# Patient Record
Sex: Female | Born: 1940
Health system: Southern US, Community
[De-identification: ages and names within clinical notes are randomized; demographics above are authoritative.]

## PROBLEM LIST (undated history)

## (undated) DIAGNOSIS — M545 Low back pain, unspecified: Secondary | ICD-10-CM

## (undated) DIAGNOSIS — R7303 Prediabetes: Secondary | ICD-10-CM

## (undated) DIAGNOSIS — M5136 Other intervertebral disc degeneration, lumbar region: Secondary | ICD-10-CM

## (undated) DIAGNOSIS — K5790 Diverticulosis of intestine, part unspecified, without perforation or abscess without bleeding: Secondary | ICD-10-CM

## (undated) DIAGNOSIS — I10 Essential (primary) hypertension: Secondary | ICD-10-CM

## (undated) DIAGNOSIS — Z9981 Dependence on supplemental oxygen: Secondary | ICD-10-CM

## (undated) DIAGNOSIS — T4145XA Adverse effect of unspecified anesthetic, initial encounter: Secondary | ICD-10-CM

## (undated) DIAGNOSIS — G8929 Other chronic pain: Secondary | ICD-10-CM

## (undated) DIAGNOSIS — E7800 Pure hypercholesterolemia, unspecified: Secondary | ICD-10-CM

## (undated) DIAGNOSIS — E119 Type 2 diabetes mellitus without complications: Secondary | ICD-10-CM

## (undated) DIAGNOSIS — I480 Paroxysmal atrial fibrillation: Secondary | ICD-10-CM

## (undated) DIAGNOSIS — K219 Gastro-esophageal reflux disease without esophagitis: Secondary | ICD-10-CM

## (undated) DIAGNOSIS — T8859XA Other complications of anesthesia, initial encounter: Secondary | ICD-10-CM

## (undated) DIAGNOSIS — R413 Other amnesia: Secondary | ICD-10-CM

## (undated) DIAGNOSIS — J449 Chronic obstructive pulmonary disease, unspecified: Secondary | ICD-10-CM

## (undated) DIAGNOSIS — J45909 Unspecified asthma, uncomplicated: Secondary | ICD-10-CM

## (undated) DIAGNOSIS — I639 Cerebral infarction, unspecified: Secondary | ICD-10-CM

## (undated) DIAGNOSIS — IMO0001 Reserved for inherently not codable concepts without codable children: Secondary | ICD-10-CM

## (undated) DIAGNOSIS — M51369 Other intervertebral disc degeneration, lumbar region without mention of lumbar back pain or lower extremity pain: Secondary | ICD-10-CM

## (undated) DIAGNOSIS — E039 Hypothyroidism, unspecified: Secondary | ICD-10-CM

## (undated) DIAGNOSIS — I739 Peripheral vascular disease, unspecified: Secondary | ICD-10-CM

## (undated) DIAGNOSIS — Z8489 Family history of other specified conditions: Secondary | ICD-10-CM

## (undated) DIAGNOSIS — F329 Major depressive disorder, single episode, unspecified: Secondary | ICD-10-CM

## (undated) DIAGNOSIS — F419 Anxiety disorder, unspecified: Secondary | ICD-10-CM

## (undated) DIAGNOSIS — F32A Depression, unspecified: Secondary | ICD-10-CM

## (undated) DIAGNOSIS — M1990 Unspecified osteoarthritis, unspecified site: Secondary | ICD-10-CM

## (undated) HISTORY — DX: Cerebral infarction, unspecified: I63.9

## (undated) HISTORY — PX: CARDIAC CATHETERIZATION: SHX172

## (undated) HISTORY — DX: Type 2 diabetes mellitus without complications: E11.9

## (undated) HISTORY — PX: KNEE ARTHROSCOPY: SHX127

## (undated) HISTORY — PX: TONSILLECTOMY: SUR1361

## (undated) HISTORY — PX: EYE SURGERY: SHX253

## (undated) HISTORY — PX: EXCISIONAL HEMORRHOIDECTOMY: SHX1541

---

## 1971-08-21 HISTORY — PX: TUBAL LIGATION: SHX77

## 1972-08-20 HISTORY — PX: VAGINAL HYSTERECTOMY: SUR661

## 1999-08-21 HISTORY — PX: SHOULDER OPEN ROTATOR CUFF REPAIR: SHX2407

## 2004-11-21 ENCOUNTER — Emergency Department: Payer: Self-pay | Admitting: Emergency Medicine

## 2005-04-02 ENCOUNTER — Ambulatory Visit: Payer: Self-pay | Admitting: Internal Medicine

## 2005-11-05 ENCOUNTER — Ambulatory Visit: Payer: Self-pay | Admitting: Internal Medicine

## 2006-02-26 ENCOUNTER — Ambulatory Visit: Payer: Self-pay | Admitting: Internal Medicine

## 2006-05-12 ENCOUNTER — Emergency Department: Payer: Self-pay | Admitting: Emergency Medicine

## 2006-05-22 ENCOUNTER — Ambulatory Visit: Payer: Self-pay

## 2006-06-28 ENCOUNTER — Ambulatory Visit: Payer: Self-pay | Admitting: General Practice

## 2006-08-20 HISTORY — PX: TOTAL KNEE ARTHROPLASTY: SHX125

## 2006-11-20 ENCOUNTER — Inpatient Hospital Stay: Payer: Self-pay | Admitting: Internal Medicine

## 2006-11-20 ENCOUNTER — Other Ambulatory Visit: Payer: Self-pay

## 2006-11-28 ENCOUNTER — Ambulatory Visit: Payer: Self-pay | Admitting: Internal Medicine

## 2007-06-09 ENCOUNTER — Ambulatory Visit: Payer: Self-pay | Admitting: General Practice

## 2007-06-25 ENCOUNTER — Inpatient Hospital Stay: Payer: Self-pay | Admitting: General Practice

## 2007-07-01 ENCOUNTER — Encounter: Payer: Self-pay | Admitting: Internal Medicine

## 2007-08-21 HISTORY — PX: JOINT REPLACEMENT: SHX530

## 2007-09-09 ENCOUNTER — Ambulatory Visit: Payer: Self-pay | Admitting: Ophthalmology

## 2008-09-30 ENCOUNTER — Ambulatory Visit: Payer: Self-pay | Admitting: Family Medicine

## 2008-10-06 ENCOUNTER — Ambulatory Visit: Payer: Self-pay | Admitting: Internal Medicine

## 2008-12-31 ENCOUNTER — Emergency Department: Payer: Self-pay | Admitting: Emergency Medicine

## 2009-07-19 ENCOUNTER — Ambulatory Visit: Payer: Self-pay | Admitting: Family Medicine

## 2009-08-13 ENCOUNTER — Emergency Department: Payer: Self-pay | Admitting: Unknown Physician Specialty

## 2009-12-16 ENCOUNTER — Ambulatory Visit: Payer: Self-pay | Admitting: Internal Medicine

## 2010-02-07 ENCOUNTER — Ambulatory Visit: Payer: Self-pay | Admitting: Gastroenterology

## 2010-04-04 ENCOUNTER — Ambulatory Visit: Payer: Self-pay | Admitting: Ophthalmology

## 2010-04-11 ENCOUNTER — Ambulatory Visit: Payer: Self-pay | Admitting: Ophthalmology

## 2010-05-16 ENCOUNTER — Ambulatory Visit: Payer: Self-pay | Admitting: Gastroenterology

## 2010-05-19 LAB — PATHOLOGY REPORT

## 2010-06-01 ENCOUNTER — Ambulatory Visit: Payer: Self-pay

## 2010-06-14 ENCOUNTER — Ambulatory Visit (HOSPITAL_COMMUNITY)
Admission: RE | Admit: 2010-06-14 | Discharge: 2010-06-14 | Payer: Self-pay | Source: Home / Self Care | Admitting: Gastroenterology

## 2010-08-13 ENCOUNTER — Emergency Department: Payer: Self-pay | Admitting: Emergency Medicine

## 2011-10-24 ENCOUNTER — Emergency Department: Payer: Self-pay

## 2011-10-24 DIAGNOSIS — R109 Unspecified abdominal pain: Secondary | ICD-10-CM | POA: Diagnosis not present

## 2011-10-24 DIAGNOSIS — Z79899 Other long term (current) drug therapy: Secondary | ICD-10-CM | POA: Diagnosis not present

## 2011-10-24 DIAGNOSIS — I10 Essential (primary) hypertension: Secondary | ICD-10-CM | POA: Diagnosis not present

## 2011-10-24 DIAGNOSIS — R112 Nausea with vomiting, unspecified: Secondary | ICD-10-CM | POA: Diagnosis not present

## 2011-10-24 LAB — CBC
HCT: 40.4 % (ref 35.0–47.0)
HGB: 13.7 g/dL (ref 12.0–16.0)
MCH: 29.7 pg (ref 26.0–34.0)
MCHC: 33.9 g/dL (ref 32.0–36.0)
MCV: 88 fL (ref 80–100)
Platelet: 324 10*3/uL (ref 150–440)
RBC: 4.61 10*6/uL (ref 3.80–5.20)
RDW: 13.4 % (ref 11.5–14.5)
WBC: 10.8 10*3/uL (ref 3.6–11.0)

## 2011-10-24 LAB — URINALYSIS, COMPLETE
Bacteria: NONE SEEN
Bilirubin,UR: NEGATIVE
Glucose,UR: NEGATIVE mg/dL (ref 0–75)
Nitrite: NEGATIVE
Ph: 7 (ref 4.5–8.0)
Protein: 100
RBC,UR: 3 /HPF (ref 0–5)
Specific Gravity: 1.016 (ref 1.003–1.030)
Squamous Epithelial: 4
WBC UR: 6 /HPF (ref 0–5)

## 2011-10-24 LAB — BASIC METABOLIC PANEL
Anion Gap: 11 (ref 7–16)
BUN: 12 mg/dL (ref 7–18)
Calcium, Total: 9.8 mg/dL (ref 8.5–10.1)
Chloride: 101 mmol/L (ref 98–107)
Co2: 28 mmol/L (ref 21–32)
Creatinine: 0.85 mg/dL (ref 0.60–1.30)
EGFR (African American): >60
EGFR (Non-African Amer.): >60
Glucose: 110 mg/dL — ABNORMAL HIGH (ref 65–99)
Osmolality: 280 (ref 275–301)
Potassium: 3.3 mmol/L — ABNORMAL LOW (ref 3.5–5.1)
Sodium: 140 mmol/L (ref 136–145)

## 2011-10-26 LAB — URINE CULTURE

## 2011-10-29 ENCOUNTER — Ambulatory Visit: Payer: Self-pay

## 2011-10-29 DIAGNOSIS — M169 Osteoarthritis of hip, unspecified: Secondary | ICD-10-CM | POA: Diagnosis not present

## 2011-10-29 DIAGNOSIS — IMO0002 Reserved for concepts with insufficient information to code with codable children: Secondary | ICD-10-CM | POA: Diagnosis not present

## 2011-10-29 DIAGNOSIS — M1610 Unilateral primary osteoarthritis, unspecified hip: Secondary | ICD-10-CM | POA: Diagnosis not present

## 2011-10-29 DIAGNOSIS — M79609 Pain in unspecified limb: Secondary | ICD-10-CM | POA: Diagnosis not present

## 2011-10-29 DIAGNOSIS — M25559 Pain in unspecified hip: Secondary | ICD-10-CM | POA: Diagnosis not present

## 2011-10-29 DIAGNOSIS — M5137 Other intervertebral disc degeneration, lumbosacral region: Secondary | ICD-10-CM | POA: Diagnosis not present

## 2011-10-30 DIAGNOSIS — R300 Dysuria: Secondary | ICD-10-CM | POA: Diagnosis not present

## 2011-11-05 DIAGNOSIS — M545 Low back pain, unspecified: Secondary | ICD-10-CM | POA: Diagnosis not present

## 2011-11-05 DIAGNOSIS — B029 Zoster without complications: Secondary | ICD-10-CM | POA: Diagnosis not present

## 2011-11-05 DIAGNOSIS — B0229 Other postherpetic nervous system involvement: Secondary | ICD-10-CM | POA: Diagnosis not present

## 2011-12-13 DIAGNOSIS — D485 Neoplasm of uncertain behavior of skin: Secondary | ICD-10-CM | POA: Diagnosis not present

## 2011-12-13 DIAGNOSIS — I10 Essential (primary) hypertension: Secondary | ICD-10-CM | POA: Diagnosis not present

## 2011-12-13 DIAGNOSIS — E039 Hypothyroidism, unspecified: Secondary | ICD-10-CM | POA: Diagnosis not present

## 2011-12-13 DIAGNOSIS — F329 Major depressive disorder, single episode, unspecified: Secondary | ICD-10-CM | POA: Diagnosis not present

## 2011-12-13 DIAGNOSIS — E785 Hyperlipidemia, unspecified: Secondary | ICD-10-CM | POA: Diagnosis not present

## 2011-12-13 DIAGNOSIS — Z0000 Encounter for general adult medical examination without abnormal findings: Secondary | ICD-10-CM | POA: Diagnosis not present

## 2011-12-13 DIAGNOSIS — F3289 Other specified depressive episodes: Secondary | ICD-10-CM | POA: Diagnosis not present

## 2011-12-13 DIAGNOSIS — R300 Dysuria: Secondary | ICD-10-CM | POA: Diagnosis not present

## 2011-12-13 DIAGNOSIS — I059 Rheumatic mitral valve disease, unspecified: Secondary | ICD-10-CM | POA: Diagnosis not present

## 2011-12-13 DIAGNOSIS — J449 Chronic obstructive pulmonary disease, unspecified: Secondary | ICD-10-CM | POA: Diagnosis not present

## 2012-01-06 DIAGNOSIS — J449 Chronic obstructive pulmonary disease, unspecified: Secondary | ICD-10-CM | POA: Diagnosis not present

## 2012-01-06 DIAGNOSIS — J209 Acute bronchitis, unspecified: Secondary | ICD-10-CM | POA: Diagnosis not present

## 2012-03-03 DIAGNOSIS — R0989 Other specified symptoms and signs involving the circulatory and respiratory systems: Secondary | ICD-10-CM | POA: Diagnosis not present

## 2012-03-03 DIAGNOSIS — J441 Chronic obstructive pulmonary disease with (acute) exacerbation: Secondary | ICD-10-CM | POA: Diagnosis not present

## 2012-03-03 DIAGNOSIS — R062 Wheezing: Secondary | ICD-10-CM | POA: Diagnosis not present

## 2012-03-03 DIAGNOSIS — R0609 Other forms of dyspnea: Secondary | ICD-10-CM | POA: Diagnosis not present

## 2012-03-10 ENCOUNTER — Ambulatory Visit: Payer: Self-pay | Admitting: Internal Medicine

## 2012-03-10 DIAGNOSIS — J441 Chronic obstructive pulmonary disease with (acute) exacerbation: Secondary | ICD-10-CM | POA: Diagnosis not present

## 2012-03-10 DIAGNOSIS — J4489 Other specified chronic obstructive pulmonary disease: Secondary | ICD-10-CM | POA: Diagnosis not present

## 2012-03-10 DIAGNOSIS — J209 Acute bronchitis, unspecified: Secondary | ICD-10-CM | POA: Diagnosis not present

## 2012-03-10 DIAGNOSIS — R0602 Shortness of breath: Secondary | ICD-10-CM | POA: Diagnosis not present

## 2012-03-10 DIAGNOSIS — R062 Wheezing: Secondary | ICD-10-CM | POA: Diagnosis not present

## 2012-03-10 DIAGNOSIS — I517 Cardiomegaly: Secondary | ICD-10-CM | POA: Diagnosis not present

## 2012-03-10 DIAGNOSIS — J449 Chronic obstructive pulmonary disease, unspecified: Secondary | ICD-10-CM | POA: Diagnosis not present

## 2012-03-10 DIAGNOSIS — R042 Hemoptysis: Secondary | ICD-10-CM | POA: Diagnosis not present

## 2012-03-19 DIAGNOSIS — F17200 Nicotine dependence, unspecified, uncomplicated: Secondary | ICD-10-CM | POA: Diagnosis not present

## 2012-03-19 DIAGNOSIS — I10 Essential (primary) hypertension: Secondary | ICD-10-CM | POA: Diagnosis not present

## 2012-03-19 DIAGNOSIS — J449 Chronic obstructive pulmonary disease, unspecified: Secondary | ICD-10-CM | POA: Diagnosis not present

## 2012-03-19 DIAGNOSIS — R0602 Shortness of breath: Secondary | ICD-10-CM | POA: Diagnosis not present

## 2012-04-02 DIAGNOSIS — R0602 Shortness of breath: Secondary | ICD-10-CM | POA: Diagnosis not present

## 2012-04-24 DIAGNOSIS — R0602 Shortness of breath: Secondary | ICD-10-CM | POA: Diagnosis not present

## 2012-04-24 DIAGNOSIS — R062 Wheezing: Secondary | ICD-10-CM | POA: Diagnosis not present

## 2012-04-24 DIAGNOSIS — R042 Hemoptysis: Secondary | ICD-10-CM | POA: Diagnosis not present

## 2012-05-05 ENCOUNTER — Ambulatory Visit: Payer: Self-pay | Admitting: Internal Medicine

## 2012-05-05 DIAGNOSIS — R042 Hemoptysis: Secondary | ICD-10-CM | POA: Diagnosis not present

## 2012-05-27 DIAGNOSIS — R042 Hemoptysis: Secondary | ICD-10-CM | POA: Diagnosis not present

## 2012-05-27 DIAGNOSIS — J449 Chronic obstructive pulmonary disease, unspecified: Secondary | ICD-10-CM | POA: Diagnosis not present

## 2012-05-27 DIAGNOSIS — F17200 Nicotine dependence, unspecified, uncomplicated: Secondary | ICD-10-CM | POA: Diagnosis not present

## 2012-08-15 DIAGNOSIS — E039 Hypothyroidism, unspecified: Secondary | ICD-10-CM | POA: Diagnosis not present

## 2012-08-15 DIAGNOSIS — Z23 Encounter for immunization: Secondary | ICD-10-CM | POA: Diagnosis not present

## 2012-08-15 DIAGNOSIS — J449 Chronic obstructive pulmonary disease, unspecified: Secondary | ICD-10-CM | POA: Diagnosis not present

## 2012-08-15 DIAGNOSIS — F17200 Nicotine dependence, unspecified, uncomplicated: Secondary | ICD-10-CM | POA: Diagnosis not present

## 2012-08-15 DIAGNOSIS — I10 Essential (primary) hypertension: Secondary | ICD-10-CM | POA: Diagnosis not present

## 2012-08-28 DIAGNOSIS — E782 Mixed hyperlipidemia: Secondary | ICD-10-CM | POA: Diagnosis not present

## 2012-08-28 DIAGNOSIS — Z0000 Encounter for general adult medical examination without abnormal findings: Secondary | ICD-10-CM | POA: Diagnosis not present

## 2012-08-28 DIAGNOSIS — E119 Type 2 diabetes mellitus without complications: Secondary | ICD-10-CM | POA: Diagnosis not present

## 2012-08-28 DIAGNOSIS — E559 Vitamin D deficiency, unspecified: Secondary | ICD-10-CM | POA: Diagnosis not present

## 2012-11-12 DIAGNOSIS — E559 Vitamin D deficiency, unspecified: Secondary | ICD-10-CM | POA: Diagnosis not present

## 2012-11-12 DIAGNOSIS — E039 Hypothyroidism, unspecified: Secondary | ICD-10-CM | POA: Diagnosis not present

## 2012-11-12 DIAGNOSIS — R000 Tachycardia, unspecified: Secondary | ICD-10-CM | POA: Diagnosis not present

## 2012-11-12 DIAGNOSIS — J449 Chronic obstructive pulmonary disease, unspecified: Secondary | ICD-10-CM | POA: Diagnosis not present

## 2012-11-12 DIAGNOSIS — R7301 Impaired fasting glucose: Secondary | ICD-10-CM | POA: Diagnosis not present

## 2012-11-12 DIAGNOSIS — M810 Age-related osteoporosis without current pathological fracture: Secondary | ICD-10-CM | POA: Diagnosis not present

## 2012-11-12 DIAGNOSIS — R011 Cardiac murmur, unspecified: Secondary | ICD-10-CM | POA: Diagnosis not present

## 2013-01-05 DIAGNOSIS — E2839 Other primary ovarian failure: Secondary | ICD-10-CM | POA: Diagnosis not present

## 2013-01-09 DIAGNOSIS — R000 Tachycardia, unspecified: Secondary | ICD-10-CM | POA: Diagnosis not present

## 2013-01-09 DIAGNOSIS — R011 Cardiac murmur, unspecified: Secondary | ICD-10-CM | POA: Diagnosis not present

## 2013-03-16 DIAGNOSIS — I10 Essential (primary) hypertension: Secondary | ICD-10-CM | POA: Diagnosis not present

## 2013-03-16 DIAGNOSIS — E039 Hypothyroidism, unspecified: Secondary | ICD-10-CM | POA: Diagnosis not present

## 2013-03-16 DIAGNOSIS — I289 Disease of pulmonary vessels, unspecified: Secondary | ICD-10-CM | POA: Diagnosis not present

## 2013-03-16 DIAGNOSIS — F17200 Nicotine dependence, unspecified, uncomplicated: Secondary | ICD-10-CM | POA: Diagnosis not present

## 2013-03-16 DIAGNOSIS — R0602 Shortness of breath: Secondary | ICD-10-CM | POA: Diagnosis not present

## 2013-03-16 DIAGNOSIS — E782 Mixed hyperlipidemia: Secondary | ICD-10-CM | POA: Diagnosis not present

## 2013-03-16 DIAGNOSIS — M810 Age-related osteoporosis without current pathological fracture: Secondary | ICD-10-CM | POA: Diagnosis not present

## 2013-03-16 DIAGNOSIS — J449 Chronic obstructive pulmonary disease, unspecified: Secondary | ICD-10-CM | POA: Diagnosis not present

## 2013-03-21 DIAGNOSIS — R5383 Other fatigue: Secondary | ICD-10-CM | POA: Diagnosis not present

## 2013-03-21 DIAGNOSIS — J449 Chronic obstructive pulmonary disease, unspecified: Secondary | ICD-10-CM | POA: Diagnosis not present

## 2013-03-21 DIAGNOSIS — R0602 Shortness of breath: Secondary | ICD-10-CM | POA: Diagnosis not present

## 2013-03-21 DIAGNOSIS — R5381 Other malaise: Secondary | ICD-10-CM | POA: Diagnosis not present

## 2013-03-30 DIAGNOSIS — J449 Chronic obstructive pulmonary disease, unspecified: Secondary | ICD-10-CM | POA: Diagnosis not present

## 2013-03-30 DIAGNOSIS — R000 Tachycardia, unspecified: Secondary | ICD-10-CM | POA: Diagnosis not present

## 2013-03-30 DIAGNOSIS — R0602 Shortness of breath: Secondary | ICD-10-CM | POA: Diagnosis not present

## 2013-05-28 DIAGNOSIS — E559 Vitamin D deficiency, unspecified: Secondary | ICD-10-CM | POA: Diagnosis not present

## 2013-05-28 DIAGNOSIS — E782 Mixed hyperlipidemia: Secondary | ICD-10-CM | POA: Diagnosis not present

## 2013-05-28 DIAGNOSIS — R7301 Impaired fasting glucose: Secondary | ICD-10-CM | POA: Diagnosis not present

## 2013-05-28 DIAGNOSIS — I10 Essential (primary) hypertension: Secondary | ICD-10-CM | POA: Diagnosis not present

## 2013-05-28 DIAGNOSIS — R002 Palpitations: Secondary | ICD-10-CM | POA: Diagnosis not present

## 2013-05-28 DIAGNOSIS — E039 Hypothyroidism, unspecified: Secondary | ICD-10-CM | POA: Diagnosis not present

## 2013-06-17 DIAGNOSIS — I10 Essential (primary) hypertension: Secondary | ICD-10-CM | POA: Diagnosis not present

## 2013-06-17 DIAGNOSIS — F17200 Nicotine dependence, unspecified, uncomplicated: Secondary | ICD-10-CM | POA: Diagnosis not present

## 2013-06-17 DIAGNOSIS — M810 Age-related osteoporosis without current pathological fracture: Secondary | ICD-10-CM | POA: Diagnosis not present

## 2013-06-17 DIAGNOSIS — Z23 Encounter for immunization: Secondary | ICD-10-CM | POA: Diagnosis not present

## 2013-06-17 DIAGNOSIS — I080 Rheumatic disorders of both mitral and aortic valves: Secondary | ICD-10-CM | POA: Diagnosis not present

## 2013-06-17 DIAGNOSIS — I289 Disease of pulmonary vessels, unspecified: Secondary | ICD-10-CM | POA: Diagnosis not present

## 2013-06-17 DIAGNOSIS — R7301 Impaired fasting glucose: Secondary | ICD-10-CM | POA: Diagnosis not present

## 2013-06-17 DIAGNOSIS — R000 Tachycardia, unspecified: Secondary | ICD-10-CM | POA: Diagnosis not present

## 2013-08-05 DIAGNOSIS — L570 Actinic keratosis: Secondary | ICD-10-CM | POA: Diagnosis not present

## 2013-08-05 DIAGNOSIS — L821 Other seborrheic keratosis: Secondary | ICD-10-CM | POA: Diagnosis not present

## 2013-08-19 ENCOUNTER — Ambulatory Visit: Payer: Self-pay | Admitting: Physician Assistant

## 2013-08-19 DIAGNOSIS — I517 Cardiomegaly: Secondary | ICD-10-CM | POA: Diagnosis not present

## 2013-08-19 DIAGNOSIS — R05 Cough: Secondary | ICD-10-CM | POA: Diagnosis not present

## 2013-08-19 DIAGNOSIS — R059 Cough, unspecified: Secondary | ICD-10-CM | POA: Diagnosis not present

## 2013-08-19 DIAGNOSIS — J440 Chronic obstructive pulmonary disease with acute lower respiratory infection: Secondary | ICD-10-CM | POA: Diagnosis not present

## 2013-08-19 DIAGNOSIS — R0602 Shortness of breath: Secondary | ICD-10-CM | POA: Diagnosis not present

## 2013-08-23 ENCOUNTER — Emergency Department: Payer: Self-pay | Admitting: Emergency Medicine

## 2013-08-23 DIAGNOSIS — J449 Chronic obstructive pulmonary disease, unspecified: Secondary | ICD-10-CM | POA: Diagnosis not present

## 2013-08-23 DIAGNOSIS — R0602 Shortness of breath: Secondary | ICD-10-CM | POA: Diagnosis not present

## 2013-08-23 DIAGNOSIS — F17200 Nicotine dependence, unspecified, uncomplicated: Secondary | ICD-10-CM | POA: Diagnosis not present

## 2013-08-23 DIAGNOSIS — J189 Pneumonia, unspecified organism: Secondary | ICD-10-CM | POA: Diagnosis not present

## 2013-08-23 DIAGNOSIS — Z9079 Acquired absence of other genital organ(s): Secondary | ICD-10-CM | POA: Diagnosis not present

## 2013-08-23 DIAGNOSIS — J42 Unspecified chronic bronchitis: Secondary | ICD-10-CM | POA: Diagnosis not present

## 2013-08-23 DIAGNOSIS — I10 Essential (primary) hypertension: Secondary | ICD-10-CM | POA: Diagnosis not present

## 2013-08-23 DIAGNOSIS — R197 Diarrhea, unspecified: Secondary | ICD-10-CM | POA: Diagnosis not present

## 2013-08-23 DIAGNOSIS — R1110 Vomiting, unspecified: Secondary | ICD-10-CM | POA: Diagnosis not present

## 2013-08-23 DIAGNOSIS — R112 Nausea with vomiting, unspecified: Secondary | ICD-10-CM | POA: Diagnosis not present

## 2013-08-23 LAB — CBC
HCT: 44.6 % (ref 35.0–47.0)
HGB: 14.8 g/dL (ref 12.0–16.0)
MCH: 28.9 pg (ref 26.0–34.0)
MCHC: 33.2 g/dL (ref 32.0–36.0)
MCV: 87 fL (ref 80–100)
Platelet: 300 10*3/uL (ref 150–440)
RBC: 5.13 10*6/uL (ref 3.80–5.20)
RDW: 13.2 % (ref 11.5–14.5)
WBC: 15 10*3/uL — ABNORMAL HIGH (ref 3.6–11.0)

## 2013-08-23 LAB — URINALYSIS, COMPLETE
Bilirubin,UR: NEGATIVE
Blood: NEGATIVE
Glucose,UR: NEGATIVE mg/dL (ref 0–75)
Hyaline Cast: 6
Ketone: NEGATIVE
Nitrite: NEGATIVE
Ph: 5 (ref 4.5–8.0)
Protein: 25
RBC,UR: 4 /HPF (ref 0–5)
Specific Gravity: 1.015 (ref 1.003–1.030)
Squamous Epithelial: 6
WBC UR: 2 /HPF (ref 0–5)

## 2013-08-23 LAB — COMPREHENSIVE METABOLIC PANEL
Albumin: 4.1 g/dL (ref 3.4–5.0)
Alkaline Phosphatase: 98 U/L
Anion Gap: 4 — ABNORMAL LOW (ref 7–16)
BUN: 24 mg/dL — ABNORMAL HIGH (ref 7–18)
Bilirubin,Total: 0.5 mg/dL (ref 0.2–1.0)
Calcium, Total: 9 mg/dL (ref 8.5–10.1)
Chloride: 100 mmol/L (ref 98–107)
Co2: 31 mmol/L (ref 21–32)
Creatinine: 0.9 mg/dL (ref 0.60–1.30)
EGFR (African American): >60
EGFR (Non-African Amer.): >60
Glucose: 100 mg/dL — ABNORMAL HIGH (ref 65–99)
Osmolality: 274 (ref 275–301)
Potassium: 3.5 mmol/L (ref 3.5–5.1)
SGOT(AST): 23 U/L (ref 15–37)
SGPT (ALT): 22 U/L (ref 12–78)
Sodium: 135 mmol/L — ABNORMAL LOW (ref 136–145)
Total Protein: 7.9 g/dL (ref 6.4–8.2)

## 2013-08-23 LAB — LIPASE, BLOOD: Lipase: 86 U/L (ref 73–393)

## 2013-08-23 LAB — TROPONIN I: Troponin-I: <0.02 ng/mL

## 2013-08-23 LAB — CLOSTRIDIUM DIFFICILE(ARMC)

## 2013-09-16 DIAGNOSIS — F17200 Nicotine dependence, unspecified, uncomplicated: Secondary | ICD-10-CM | POA: Diagnosis not present

## 2013-09-16 DIAGNOSIS — I080 Rheumatic disorders of both mitral and aortic valves: Secondary | ICD-10-CM | POA: Diagnosis not present

## 2013-09-16 DIAGNOSIS — E782 Mixed hyperlipidemia: Secondary | ICD-10-CM | POA: Diagnosis not present

## 2013-09-16 DIAGNOSIS — E559 Vitamin D deficiency, unspecified: Secondary | ICD-10-CM | POA: Diagnosis not present

## 2013-09-16 DIAGNOSIS — E039 Hypothyroidism, unspecified: Secondary | ICD-10-CM | POA: Diagnosis not present

## 2013-09-16 DIAGNOSIS — J449 Chronic obstructive pulmonary disease, unspecified: Secondary | ICD-10-CM | POA: Diagnosis not present

## 2013-09-16 DIAGNOSIS — M810 Age-related osteoporosis without current pathological fracture: Secondary | ICD-10-CM | POA: Diagnosis not present

## 2014-01-13 DIAGNOSIS — K219 Gastro-esophageal reflux disease without esophagitis: Secondary | ICD-10-CM | POA: Diagnosis not present

## 2014-01-13 DIAGNOSIS — E039 Hypothyroidism, unspecified: Secondary | ICD-10-CM | POA: Diagnosis not present

## 2014-01-13 DIAGNOSIS — I10 Essential (primary) hypertension: Secondary | ICD-10-CM | POA: Diagnosis not present

## 2014-01-13 DIAGNOSIS — J449 Chronic obstructive pulmonary disease, unspecified: Secondary | ICD-10-CM | POA: Diagnosis not present

## 2014-01-13 DIAGNOSIS — R062 Wheezing: Secondary | ICD-10-CM | POA: Diagnosis not present

## 2014-06-19 ENCOUNTER — Inpatient Hospital Stay: Payer: Self-pay | Admitting: Internal Medicine

## 2014-06-19 DIAGNOSIS — K922 Gastrointestinal hemorrhage, unspecified: Secondary | ICD-10-CM | POA: Diagnosis not present

## 2014-06-19 DIAGNOSIS — E039 Hypothyroidism, unspecified: Secondary | ICD-10-CM | POA: Diagnosis present

## 2014-06-19 DIAGNOSIS — K921 Melena: Secondary | ICD-10-CM | POA: Diagnosis not present

## 2014-06-19 DIAGNOSIS — K529 Noninfective gastroenteritis and colitis, unspecified: Secondary | ICD-10-CM | POA: Diagnosis not present

## 2014-06-19 DIAGNOSIS — E785 Hyperlipidemia, unspecified: Secondary | ICD-10-CM | POA: Diagnosis present

## 2014-06-19 DIAGNOSIS — Z716 Tobacco abuse counseling: Secondary | ICD-10-CM | POA: Diagnosis not present

## 2014-06-19 DIAGNOSIS — D72829 Elevated white blood cell count, unspecified: Secondary | ICD-10-CM | POA: Diagnosis not present

## 2014-06-19 DIAGNOSIS — R112 Nausea with vomiting, unspecified: Secondary | ICD-10-CM | POA: Diagnosis not present

## 2014-06-19 DIAGNOSIS — R1031 Right lower quadrant pain: Secondary | ICD-10-CM | POA: Diagnosis not present

## 2014-06-19 DIAGNOSIS — F17210 Nicotine dependence, cigarettes, uncomplicated: Secondary | ICD-10-CM | POA: Diagnosis not present

## 2014-06-19 DIAGNOSIS — I10 Essential (primary) hypertension: Secondary | ICD-10-CM | POA: Diagnosis not present

## 2014-06-19 DIAGNOSIS — Z79891 Long term (current) use of opiate analgesic: Secondary | ICD-10-CM | POA: Diagnosis not present

## 2014-06-19 DIAGNOSIS — R197 Diarrhea, unspecified: Secondary | ICD-10-CM | POA: Diagnosis not present

## 2014-06-19 DIAGNOSIS — E0590 Thyrotoxicosis, unspecified without thyrotoxic crisis or storm: Secondary | ICD-10-CM | POA: Diagnosis present

## 2014-06-19 DIAGNOSIS — R109 Unspecified abdominal pain: Secondary | ICD-10-CM | POA: Diagnosis not present

## 2014-06-19 DIAGNOSIS — M810 Age-related osteoporosis without current pathological fracture: Secondary | ICD-10-CM | POA: Diagnosis present

## 2014-06-19 DIAGNOSIS — J449 Chronic obstructive pulmonary disease, unspecified: Secondary | ICD-10-CM | POA: Diagnosis not present

## 2014-06-19 LAB — COMPREHENSIVE METABOLIC PANEL
Albumin: 4 g/dL (ref 3.4–5.0)
Alkaline Phosphatase: 102 U/L
Anion Gap: 11 (ref 7–16)
BUN: 17 mg/dL (ref 7–18)
Bilirubin,Total: 0.5 mg/dL (ref 0.2–1.0)
Calcium, Total: 9.2 mg/dL (ref 8.5–10.1)
Chloride: 106 mmol/L (ref 98–107)
Co2: 25 mmol/L (ref 21–32)
Creatinine: 0.84 mg/dL (ref 0.60–1.30)
EGFR (African American): >60
EGFR (Non-African Amer.): >60
Glucose: 139 mg/dL — ABNORMAL HIGH (ref 65–99)
Osmolality: 287 (ref 275–301)
Potassium: 3.9 mmol/L (ref 3.5–5.1)
SGOT(AST): 20 U/L (ref 15–37)
SGPT (ALT): 20 U/L
Sodium: 142 mmol/L (ref 136–145)
Total Protein: 7.4 g/dL (ref 6.4–8.2)

## 2014-06-19 LAB — CBC
HCT: 41.6 % (ref 35.0–47.0)
HGB: 13.8 g/dL (ref 12.0–16.0)
MCH: 30 pg (ref 26.0–34.0)
MCHC: 33.3 g/dL (ref 32.0–36.0)
MCV: 90 fL (ref 80–100)
Platelet: 294 10*3/uL (ref 150–440)
RBC: 4.62 10*6/uL (ref 3.80–5.20)
RDW: 13.5 % (ref 11.5–14.5)
WBC: 15.2 10*3/uL — ABNORMAL HIGH (ref 3.6–11.0)

## 2014-06-19 LAB — URINALYSIS, COMPLETE
Bacteria: NONE SEEN
Bilirubin,UR: NEGATIVE
Glucose,UR: NEGATIVE mg/dL (ref 0–75)
Leukocyte Esterase: NEGATIVE
Nitrite: NEGATIVE
Ph: 7 (ref 4.5–8.0)
Protein: NEGATIVE
RBC,UR: 4 /HPF (ref 0–5)
Specific Gravity: 1.014 (ref 1.003–1.030)
Squamous Epithelial: 4
WBC UR: 1 /HPF (ref 0–5)

## 2014-06-19 LAB — TROPONIN I: Troponin-I: <0.02 ng/mL

## 2014-06-19 LAB — LIPASE, BLOOD: Lipase: 59 U/L — ABNORMAL LOW (ref 73–393)

## 2014-06-19 LAB — HEMOGLOBIN: HGB: 14.2 g/dL (ref 12.0–16.0)

## 2014-06-19 LAB — CLOSTRIDIUM DIFFICILE(ARMC)

## 2014-06-20 LAB — BASIC METABOLIC PANEL
Anion Gap: 6 — ABNORMAL LOW (ref 7–16)
BUN: 15 mg/dL (ref 7–18)
Calcium, Total: 7.9 mg/dL — ABNORMAL LOW (ref 8.5–10.1)
Chloride: 103 mmol/L (ref 98–107)
Co2: 27 mmol/L (ref 21–32)
Creatinine: 0.84 mg/dL (ref 0.60–1.30)
Glucose: 146 mg/dL — ABNORMAL HIGH (ref 65–99)
Osmolality: 275 (ref 275–301)
Potassium: 3.8 mmol/L (ref 3.5–5.1)
Sodium: 136 mmol/L (ref 136–145)

## 2014-06-20 LAB — CBC WITH DIFFERENTIAL/PLATELET
Basophil #: 0.1 10*3/uL (ref 0.0–0.1)
Basophil %: 0.6 %
Eosinophil #: 0 10*3/uL (ref 0.0–0.7)
Eosinophil %: 0.1 %
HCT: 43.1 % (ref 35.0–47.0)
HGB: 14.1 g/dL (ref 12.0–16.0)
Lymphocyte #: 1.1 10*3/uL (ref 1.0–3.6)
Lymphocyte %: 5 %
MCH: 29.8 pg (ref 26.0–34.0)
MCHC: 32.7 g/dL (ref 32.0–36.0)
MCV: 91 fL (ref 80–100)
Monocyte #: 1.9 x10 3/mm — ABNORMAL HIGH (ref 0.2–0.9)
Monocyte %: 8.5 %
Neutrophil #: 19.1 10*3/uL — ABNORMAL HIGH (ref 1.4–6.5)
Neutrophil %: 85.8 %
Platelet: 273 10*3/uL (ref 150–440)
RBC: 4.74 10*6/uL (ref 3.80–5.20)
RDW: 13.8 % (ref 11.5–14.5)
WBC: 22.2 10*3/uL — ABNORMAL HIGH (ref 3.6–11.0)

## 2014-06-20 LAB — MAGNESIUM: Magnesium: 1.3 mg/dL — ABNORMAL LOW

## 2014-06-21 LAB — CBC WITH DIFFERENTIAL/PLATELET
Basophil #: 0.1 10*3/uL (ref 0.0–0.1)
Basophil %: 0.4 %
Eosinophil #: 0 10*3/uL (ref 0.0–0.7)
Eosinophil %: 0.3 %
HCT: 37.3 % (ref 35.0–47.0)
HGB: 12.5 g/dL (ref 12.0–16.0)
Lymphocyte #: 1 10*3/uL (ref 1.0–3.6)
Lymphocyte %: 5.6 %
MCH: 30.3 pg (ref 26.0–34.0)
MCHC: 33.5 g/dL (ref 32.0–36.0)
MCV: 90 fL (ref 80–100)
Monocyte #: 1.2 x10 3/mm — ABNORMAL HIGH (ref 0.2–0.9)
Monocyte %: 6.7 %
Neutrophil #: 15.8 10*3/uL — ABNORMAL HIGH (ref 1.4–6.5)
Neutrophil %: 87 %
Platelet: 225 10*3/uL (ref 150–440)
RBC: 4.12 10*6/uL (ref 3.80–5.20)
RDW: 13.2 % (ref 11.5–14.5)
WBC: 18.2 10*3/uL — ABNORMAL HIGH (ref 3.6–11.0)

## 2014-06-21 LAB — STOOL CULTURE

## 2014-06-22 LAB — BASIC METABOLIC PANEL
Anion Gap: 7 (ref 7–16)
BUN: 5 mg/dL — ABNORMAL LOW (ref 7–18)
Calcium, Total: 7.8 mg/dL — ABNORMAL LOW (ref 8.5–10.1)
Chloride: 107 mmol/L (ref 98–107)
Co2: 27 mmol/L (ref 21–32)
Creatinine: 0.51 mg/dL — ABNORMAL LOW (ref 0.60–1.30)
EGFR (African American): >60
EGFR (Non-African Amer.): >60
Glucose: 132 mg/dL — ABNORMAL HIGH (ref 65–99)
Osmolality: 280 (ref 275–301)
Potassium: 3.5 mmol/L (ref 3.5–5.1)
Sodium: 141 mmol/L (ref 136–145)

## 2014-06-22 LAB — MAGNESIUM: Magnesium: 1.5 mg/dL — ABNORMAL LOW

## 2014-06-22 LAB — CBC WITH DIFFERENTIAL/PLATELET
Basophil #: 0 10*3/uL (ref 0.0–0.1)
Basophil %: 0 %
Eosinophil #: 0 10*3/uL (ref 0.0–0.7)
Eosinophil %: 0.1 %
HCT: 33 % — ABNORMAL LOW (ref 35.0–47.0)
HGB: 10.9 g/dL — ABNORMAL LOW (ref 12.0–16.0)
Lymphocyte #: 0.3 10*3/uL — ABNORMAL LOW (ref 1.0–3.6)
Lymphocyte %: 2.2 %
MCH: 30 pg (ref 26.0–34.0)
MCHC: 33.1 g/dL (ref 32.0–36.0)
MCV: 91 fL (ref 80–100)
Monocyte #: 0.2 x10 3/mm (ref 0.2–0.9)
Monocyte %: 1.2 %
Neutrophil #: 13.7 10*3/uL — ABNORMAL HIGH (ref 1.4–6.5)
Neutrophil %: 96.5 %
Platelet: 208 10*3/uL (ref 150–440)
RBC: 3.64 10*6/uL — ABNORMAL LOW (ref 3.80–5.20)
RDW: 13.4 % (ref 11.5–14.5)
WBC: 14.2 10*3/uL — ABNORMAL HIGH (ref 3.6–11.0)

## 2014-06-23 LAB — CBC WITH DIFFERENTIAL/PLATELET
Basophil #: 0.1 10*3/uL (ref 0.0–0.1)
Basophil %: 0.6 %
Eosinophil #: 0 10*3/uL (ref 0.0–0.7)
Eosinophil %: 0.1 %
HCT: 35.2 % (ref 35.0–47.0)
HGB: 12.1 g/dL (ref 12.0–16.0)
Lymphocyte #: 0.6 10*3/uL — ABNORMAL LOW (ref 1.0–3.6)
Lymphocyte %: 3.4 %
MCH: 30.4 pg (ref 26.0–34.0)
MCHC: 34.4 g/dL (ref 32.0–36.0)
MCV: 88 fL (ref 80–100)
Monocyte #: 0.8 x10 3/mm (ref 0.2–0.9)
Monocyte %: 4.6 %
Neutrophil #: 16.7 10*3/uL — ABNORMAL HIGH (ref 1.4–6.5)
Neutrophil %: 91.3 %
Platelet: 286 10*3/uL (ref 150–440)
RBC: 3.99 10*6/uL (ref 3.80–5.20)
RDW: 13.3 % (ref 11.5–14.5)
WBC: 18.3 10*3/uL — ABNORMAL HIGH (ref 3.6–11.0)

## 2014-06-23 LAB — MAGNESIUM: Magnesium: 1.9 mg/dL

## 2014-07-08 DIAGNOSIS — F17210 Nicotine dependence, cigarettes, uncomplicated: Secondary | ICD-10-CM | POA: Diagnosis not present

## 2014-07-08 DIAGNOSIS — N390 Urinary tract infection, site not specified: Secondary | ICD-10-CM | POA: Diagnosis not present

## 2014-07-08 DIAGNOSIS — E860 Dehydration: Secondary | ICD-10-CM | POA: Diagnosis not present

## 2014-07-08 DIAGNOSIS — E782 Mixed hyperlipidemia: Secondary | ICD-10-CM | POA: Diagnosis not present

## 2014-07-08 DIAGNOSIS — R197 Diarrhea, unspecified: Secondary | ICD-10-CM | POA: Diagnosis not present

## 2014-07-08 DIAGNOSIS — Z0001 Encounter for general adult medical examination with abnormal findings: Secondary | ICD-10-CM | POA: Diagnosis not present

## 2014-07-08 DIAGNOSIS — E039 Hypothyroidism, unspecified: Secondary | ICD-10-CM | POA: Diagnosis not present

## 2014-07-08 DIAGNOSIS — I10 Essential (primary) hypertension: Secondary | ICD-10-CM | POA: Diagnosis not present

## 2014-07-08 DIAGNOSIS — K50119 Crohn's disease of large intestine with unspecified complications: Secondary | ICD-10-CM | POA: Diagnosis not present

## 2014-07-08 DIAGNOSIS — Z23 Encounter for immunization: Secondary | ICD-10-CM | POA: Diagnosis not present

## 2014-07-12 ENCOUNTER — Emergency Department: Payer: Self-pay | Admitting: Emergency Medicine

## 2014-07-12 DIAGNOSIS — F419 Anxiety disorder, unspecified: Secondary | ICD-10-CM | POA: Diagnosis not present

## 2014-07-12 DIAGNOSIS — K5900 Constipation, unspecified: Secondary | ICD-10-CM | POA: Diagnosis not present

## 2014-07-12 DIAGNOSIS — Z7952 Long term (current) use of systemic steroids: Secondary | ICD-10-CM | POA: Diagnosis not present

## 2014-07-12 DIAGNOSIS — R1030 Lower abdominal pain, unspecified: Secondary | ICD-10-CM | POA: Diagnosis not present

## 2014-07-12 DIAGNOSIS — Z79899 Other long term (current) drug therapy: Secondary | ICD-10-CM | POA: Diagnosis not present

## 2014-07-12 DIAGNOSIS — R197 Diarrhea, unspecified: Secondary | ICD-10-CM | POA: Diagnosis not present

## 2014-07-12 DIAGNOSIS — R100 Acute abdomen: Secondary | ICD-10-CM | POA: Diagnosis not present

## 2014-07-12 LAB — CBC WITH DIFFERENTIAL/PLATELET
Basophil #: 0 10*3/uL (ref 0.0–0.1)
Basophil %: 0.3 %
Eosinophil #: 0.2 10*3/uL (ref 0.0–0.7)
Eosinophil %: 2.4 %
HCT: 35.7 % (ref 35.0–47.0)
HGB: 12.1 g/dL (ref 12.0–16.0)
Lymphocyte #: 0.8 10*3/uL — ABNORMAL LOW (ref 1.0–3.6)
Lymphocyte %: 10.9 %
MCH: 30.1 pg (ref 26.0–34.0)
MCHC: 33.8 g/dL (ref 32.0–36.0)
MCV: 89 fL (ref 80–100)
Monocyte #: 0.1 x10 3/mm — ABNORMAL LOW (ref 0.2–0.9)
Monocyte %: 1.6 %
Neutrophil #: 6.4 10*3/uL (ref 1.4–6.5)
Neutrophil %: 84.8 %
Platelet: 314 10*3/uL (ref 150–440)
RBC: 4.01 10*6/uL (ref 3.80–5.20)
RDW: 13.1 % (ref 11.5–14.5)
WBC: 7.5 10*3/uL (ref 3.6–11.0)

## 2014-07-12 LAB — COMPREHENSIVE METABOLIC PANEL
Albumin: 2.4 g/dL — ABNORMAL LOW (ref 3.4–5.0)
Alkaline Phosphatase: 54 U/L
Anion Gap: 9 (ref 7–16)
BUN: 17 mg/dL (ref 7–18)
Bilirubin,Total: 0.6 mg/dL (ref 0.2–1.0)
Calcium, Total: 8.1 mg/dL — ABNORMAL LOW (ref 8.5–10.1)
Chloride: 101 mmol/L (ref 98–107)
Co2: 27 mmol/L (ref 21–32)
Creatinine: 0.93 mg/dL (ref 0.60–1.30)
EGFR (African American): >60
EGFR (Non-African Amer.): >60
Glucose: 133 mg/dL — ABNORMAL HIGH (ref 65–99)
Osmolality: 277 (ref 275–301)
Potassium: 3.6 mmol/L (ref 3.5–5.1)
SGOT(AST): 16 U/L (ref 15–37)
SGPT (ALT): 16 U/L
Sodium: 137 mmol/L (ref 136–145)
Total Protein: 5.7 g/dL — ABNORMAL LOW (ref 6.4–8.2)

## 2014-07-26 DIAGNOSIS — R197 Diarrhea, unspecified: Secondary | ICD-10-CM | POA: Diagnosis not present

## 2014-07-27 ENCOUNTER — Ambulatory Visit: Payer: Self-pay | Admitting: Unknown Physician Specialty

## 2014-07-27 DIAGNOSIS — R197 Diarrhea, unspecified: Secondary | ICD-10-CM | POA: Diagnosis not present

## 2014-07-27 LAB — CLOSTRIDIUM DIFFICILE(ARMC)

## 2014-07-29 LAB — STOOL CULTURE

## 2014-08-02 ENCOUNTER — Emergency Department: Payer: Self-pay | Admitting: Emergency Medicine

## 2014-08-02 DIAGNOSIS — S0990XA Unspecified injury of head, initial encounter: Secondary | ICD-10-CM | POA: Diagnosis not present

## 2014-08-02 DIAGNOSIS — R000 Tachycardia, unspecified: Secondary | ICD-10-CM | POA: Diagnosis not present

## 2014-08-02 DIAGNOSIS — R0781 Pleurodynia: Secondary | ICD-10-CM | POA: Diagnosis not present

## 2014-08-02 DIAGNOSIS — K625 Hemorrhage of anus and rectum: Secondary | ICD-10-CM | POA: Diagnosis not present

## 2014-08-02 DIAGNOSIS — R5383 Other fatigue: Secondary | ICD-10-CM | POA: Diagnosis not present

## 2014-08-02 DIAGNOSIS — S3991XA Unspecified injury of abdomen, initial encounter: Secondary | ICD-10-CM | POA: Diagnosis not present

## 2014-08-02 DIAGNOSIS — S299XXA Unspecified injury of thorax, initial encounter: Secondary | ICD-10-CM | POA: Diagnosis not present

## 2014-08-02 DIAGNOSIS — E860 Dehydration: Secondary | ICD-10-CM | POA: Diagnosis not present

## 2014-08-02 DIAGNOSIS — R531 Weakness: Secondary | ICD-10-CM | POA: Diagnosis not present

## 2014-08-02 DIAGNOSIS — I700 Atherosclerosis of aorta: Secondary | ICD-10-CM | POA: Diagnosis not present

## 2014-08-02 LAB — BASIC METABOLIC PANEL
Anion Gap: 9 (ref 7–16)
Anion Gap: 7 (ref 7–16)
BUN: 4 mg/dL — ABNORMAL LOW (ref 7–18)
BUN: 5 mg/dL — ABNORMAL LOW (ref 7–18)
Calcium, Total: 8.3 mg/dL — ABNORMAL LOW (ref 8.5–10.1)
Calcium, Total: 8.3 mg/dL — ABNORMAL LOW (ref 8.5–10.1)
Chloride: 105 mmol/L (ref 98–107)
Chloride: 105 mmol/L (ref 98–107)
Co2: 25 mmol/L (ref 21–32)
Co2: 28 mmol/L (ref 21–32)
Creatinine: 0.76 mg/dL (ref 0.60–1.30)
Creatinine: 1.09 mg/dL (ref 0.60–1.30)
EGFR (African American): >60
EGFR (African American): >60
EGFR (Non-African Amer.): >60
EGFR (Non-African Amer.): 52 — ABNORMAL LOW
Glucose: 123 mg/dL — ABNORMAL HIGH (ref 65–99)
Glucose: 207 mg/dL — ABNORMAL HIGH (ref 65–99)
Osmolality: 276 (ref 275–301)
Osmolality: 283 (ref 275–301)
Potassium: 4.1 mmol/L (ref 3.5–5.1)
Potassium: 3.9 mmol/L (ref 3.5–5.1)
Sodium: 139 mmol/L (ref 136–145)
Sodium: 140 mmol/L (ref 136–145)

## 2014-08-02 LAB — CBC WITH DIFFERENTIAL/PLATELET
Basophil #: 0.1 10*3/uL (ref 0.0–0.1)
Basophil #: 0 10*3/uL (ref 0.0–0.1)
Basophil %: 1.1 %
Basophil %: 0.7 %
Eosinophil #: 0.2 10*3/uL (ref 0.0–0.7)
Eosinophil #: 0.1 10*3/uL (ref 0.0–0.7)
Eosinophil %: 3.2 %
Eosinophil %: 2.1 %
HCT: 33.3 % — ABNORMAL LOW (ref 35.0–47.0)
HCT: 33.4 % — ABNORMAL LOW (ref 35.0–47.0)
HGB: 11.2 g/dL — ABNORMAL LOW (ref 12.0–16.0)
HGB: 11.1 g/dL — ABNORMAL LOW (ref 12.0–16.0)
Lymphocyte #: 1.9 10*3/uL (ref 1.0–3.6)
Lymphocyte #: 1.7 10*3/uL (ref 1.0–3.6)
Lymphocyte %: 36.2 %
Lymphocyte %: 29.3 %
MCH: 30.4 pg (ref 26.0–34.0)
MCH: 30 pg (ref 26.0–34.0)
MCHC: 33.7 g/dL (ref 32.0–36.0)
MCHC: 33.1 g/dL (ref 32.0–36.0)
MCV: 90 fL (ref 80–100)
MCV: 91 fL (ref 80–100)
Monocyte #: 0.6 x10 3/mm (ref 0.2–0.9)
Monocyte #: 0.8 x10 3/mm (ref 0.2–0.9)
Monocyte %: 11.8 %
Monocyte %: 13.4 %
Neutrophil #: 2.5 10*3/uL (ref 1.4–6.5)
Neutrophil #: 3.1 10*3/uL (ref 1.4–6.5)
Neutrophil %: 47.7 %
Neutrophil %: 54.5 %
Platelet: 350 10*3/uL (ref 150–440)
Platelet: 349 10*3/uL (ref 150–440)
RBC: 3.69 10*6/uL — ABNORMAL LOW (ref 3.80–5.20)
RBC: 3.69 10*6/uL — ABNORMAL LOW (ref 3.80–5.20)
RDW: 14.7 % — ABNORMAL HIGH (ref 11.5–14.5)
RDW: 14.3 % (ref 11.5–14.5)
WBC: 5.2 10*3/uL (ref 3.6–11.0)
WBC: 5.7 10*3/uL (ref 3.6–11.0)

## 2014-08-02 LAB — LIPASE, BLOOD: Lipase: 50 U/L — ABNORMAL LOW (ref 73–393)

## 2014-08-02 LAB — HEPATIC FUNCTION PANEL A (ARMC)
Albumin: 2.5 g/dL — ABNORMAL LOW (ref 3.4–5.0)
Alkaline Phosphatase: 100 U/L
Bilirubin, Direct: <0.1 mg/dL (ref 0.0–0.2)
Bilirubin,Total: 0.4 mg/dL (ref 0.2–1.0)
SGOT(AST): 31 U/L (ref 15–37)
SGPT (ALT): 27 U/L
Total Protein: 5.8 g/dL — ABNORMAL LOW (ref 6.4–8.2)

## 2014-08-02 LAB — APTT: Activated PTT: 35.8 secs (ref 23.6–35.9)

## 2014-08-02 LAB — TROPONIN I
Troponin-I: <0.02 ng/mL
Troponin-I: <0.02 ng/mL

## 2014-08-02 LAB — TSH: Thyroid Stimulating Horm: 5.22 u[IU]/mL — ABNORMAL HIGH

## 2014-08-02 LAB — PROTIME-INR
INR: 1
Prothrombin Time: 13.2 secs (ref 11.5–14.7)

## 2014-08-03 ENCOUNTER — Inpatient Hospital Stay: Payer: Self-pay | Admitting: Specialist

## 2014-08-03 DIAGNOSIS — K551 Chronic vascular disorders of intestine: Secondary | ICD-10-CM | POA: Diagnosis not present

## 2014-08-03 DIAGNOSIS — Z96652 Presence of left artificial knee joint: Secondary | ICD-10-CM | POA: Diagnosis present

## 2014-08-03 DIAGNOSIS — S299XXA Unspecified injury of thorax, initial encounter: Secondary | ICD-10-CM | POA: Diagnosis not present

## 2014-08-03 DIAGNOSIS — I6523 Occlusion and stenosis of bilateral carotid arteries: Secondary | ICD-10-CM | POA: Diagnosis not present

## 2014-08-03 DIAGNOSIS — Z538 Procedure and treatment not carried out for other reasons: Secondary | ICD-10-CM | POA: Diagnosis not present

## 2014-08-03 DIAGNOSIS — R55 Syncope and collapse: Secondary | ICD-10-CM | POA: Diagnosis not present

## 2014-08-03 DIAGNOSIS — A047 Enterocolitis due to Clostridium difficile: Secondary | ICD-10-CM | POA: Diagnosis not present

## 2014-08-03 DIAGNOSIS — R000 Tachycardia, unspecified: Secondary | ICD-10-CM | POA: Diagnosis not present

## 2014-08-03 DIAGNOSIS — F17210 Nicotine dependence, cigarettes, uncomplicated: Secondary | ICD-10-CM | POA: Diagnosis present

## 2014-08-03 DIAGNOSIS — S80211A Abrasion, right knee, initial encounter: Secondary | ICD-10-CM | POA: Diagnosis present

## 2014-08-03 DIAGNOSIS — E860 Dehydration: Secondary | ICD-10-CM | POA: Diagnosis not present

## 2014-08-03 DIAGNOSIS — R0902 Hypoxemia: Secondary | ICD-10-CM | POA: Diagnosis not present

## 2014-08-03 DIAGNOSIS — R5383 Other fatigue: Secondary | ICD-10-CM | POA: Diagnosis not present

## 2014-08-03 DIAGNOSIS — S0990XA Unspecified injury of head, initial encounter: Secondary | ICD-10-CM | POA: Diagnosis not present

## 2014-08-03 DIAGNOSIS — I951 Orthostatic hypotension: Secondary | ICD-10-CM | POA: Diagnosis not present

## 2014-08-03 DIAGNOSIS — R531 Weakness: Secondary | ICD-10-CM | POA: Diagnosis not present

## 2014-08-03 DIAGNOSIS — E039 Hypothyroidism, unspecified: Secondary | ICD-10-CM | POA: Diagnosis present

## 2014-08-03 DIAGNOSIS — M810 Age-related osteoporosis without current pathological fracture: Secondary | ICD-10-CM | POA: Diagnosis present

## 2014-08-03 DIAGNOSIS — K559 Vascular disorder of intestine, unspecified: Secondary | ICD-10-CM | POA: Diagnosis present

## 2014-08-03 DIAGNOSIS — K219 Gastro-esophageal reflux disease without esophagitis: Secondary | ICD-10-CM | POA: Diagnosis present

## 2014-08-03 DIAGNOSIS — S50312A Abrasion of left elbow, initial encounter: Secondary | ICD-10-CM | POA: Diagnosis present

## 2014-08-03 DIAGNOSIS — S3991XA Unspecified injury of abdomen, initial encounter: Secondary | ICD-10-CM | POA: Diagnosis not present

## 2014-08-03 DIAGNOSIS — I700 Atherosclerosis of aorta: Secondary | ICD-10-CM | POA: Diagnosis not present

## 2014-08-03 DIAGNOSIS — R296 Repeated falls: Secondary | ICD-10-CM | POA: Diagnosis not present

## 2014-08-03 DIAGNOSIS — R197 Diarrhea, unspecified: Secondary | ICD-10-CM | POA: Diagnosis not present

## 2014-08-03 DIAGNOSIS — Z79899 Other long term (current) drug therapy: Secondary | ICD-10-CM | POA: Diagnosis not present

## 2014-08-03 DIAGNOSIS — R0781 Pleurodynia: Secondary | ICD-10-CM | POA: Diagnosis not present

## 2014-08-03 DIAGNOSIS — E785 Hyperlipidemia, unspecified: Secondary | ICD-10-CM | POA: Diagnosis present

## 2014-08-03 DIAGNOSIS — F329 Major depressive disorder, single episode, unspecified: Secondary | ICD-10-CM | POA: Diagnosis present

## 2014-08-03 DIAGNOSIS — I10 Essential (primary) hypertension: Secondary | ICD-10-CM | POA: Diagnosis not present

## 2014-08-03 DIAGNOSIS — J449 Chronic obstructive pulmonary disease, unspecified: Secondary | ICD-10-CM | POA: Diagnosis not present

## 2014-08-03 LAB — URINALYSIS, COMPLETE
Bacteria: NONE SEEN
Bilirubin,UR: NEGATIVE
Blood: NEGATIVE
Glucose,UR: NEGATIVE mg/dL (ref 0–75)
Ketone: NEGATIVE
Leukocyte Esterase: NEGATIVE
Nitrite: NEGATIVE
Ph: 6 (ref 4.5–8.0)
Protein: NEGATIVE
RBC,UR: <1 /HPF (ref 0–5)
Specific Gravity: 1.035 (ref 1.003–1.030)
Squamous Epithelial: 2
WBC UR: 1 /HPF (ref 0–5)

## 2014-08-03 LAB — T4, FREE: Free Thyroxine: 1.26 ng/dL (ref 0.76–1.46)

## 2014-08-04 LAB — BASIC METABOLIC PANEL
Anion Gap: 5 — ABNORMAL LOW (ref 7–16)
BUN: 3 mg/dL — ABNORMAL LOW (ref 7–18)
Calcium, Total: 7.7 mg/dL — ABNORMAL LOW (ref 8.5–10.1)
Chloride: 110 mmol/L — ABNORMAL HIGH (ref 98–107)
Co2: 28 mmol/L (ref 21–32)
Creatinine: 0.59 mg/dL — ABNORMAL LOW (ref 0.60–1.30)
EGFR (African American): >60
EGFR (Non-African Amer.): >60
Glucose: 99 mg/dL (ref 65–99)
Osmolality: 282 (ref 275–301)
Potassium: 3.8 mmol/L (ref 3.5–5.1)
Sodium: 143 mmol/L (ref 136–145)

## 2014-08-04 LAB — CBC WITH DIFFERENTIAL/PLATELET
Basophil #: 0 10*3/uL (ref 0.0–0.1)
Basophil %: 0.9 %
Eosinophil #: 0.2 10*3/uL (ref 0.0–0.7)
Eosinophil %: 4.1 %
HCT: 28.2 % — ABNORMAL LOW (ref 35.0–47.0)
HGB: 9.3 g/dL — ABNORMAL LOW (ref 12.0–16.0)
Lymphocyte #: 1.4 10*3/uL (ref 1.0–3.6)
Lymphocyte %: 33.1 %
MCH: 30 pg (ref 26.0–34.0)
MCHC: 33 g/dL (ref 32.0–36.0)
MCV: 91 fL (ref 80–100)
Monocyte #: 0.7 x10 3/mm (ref 0.2–0.9)
Monocyte %: 15 %
Neutrophil #: 2.1 10*3/uL (ref 1.4–6.5)
Neutrophil %: 46.9 %
Platelet: 285 10*3/uL (ref 150–440)
RBC: 3.1 10*6/uL — ABNORMAL LOW (ref 3.80–5.20)
RDW: 14.9 % — ABNORMAL HIGH (ref 11.5–14.5)
WBC: 4.4 10*3/uL (ref 3.6–11.0)

## 2014-08-05 LAB — MAGNESIUM: Magnesium: 1.7 mg/dL — ABNORMAL LOW

## 2014-08-05 LAB — POTASSIUM: Potassium: 3.8 mmol/L (ref 3.5–5.1)

## 2014-08-05 LAB — HEMOGLOBIN: HGB: 10.1 g/dL — ABNORMAL LOW (ref 12.0–16.0)

## 2014-08-05 LAB — CLOSTRIDIUM DIFFICILE(ARMC)

## 2014-08-05 LAB — OCCULT BLOOD X 1 CARD TO LAB, STOOL: Occult Blood, Feces: NEGATIVE

## 2014-08-07 LAB — STOOL CULTURE

## 2014-08-07 LAB — POTASSIUM: Potassium: 4 mmol/L (ref 3.5–5.1)

## 2014-08-08 LAB — CREATININE, SERUM
Creatinine: 0.69 mg/dL (ref 0.60–1.30)
EGFR (African American): >60
EGFR (Non-African Amer.): >60

## 2014-08-20 ENCOUNTER — Inpatient Hospital Stay (HOSPITAL_COMMUNITY)
Admission: EM | Admit: 2014-08-20 | Discharge: 2014-08-28 | DRG: 372 | Disposition: A | Discharge status: Home / Self Care | Payer: Medicare Other | Attending: Internal Medicine | Admitting: Internal Medicine

## 2014-08-20 ENCOUNTER — Encounter (HOSPITAL_COMMUNITY): Payer: Self-pay | Admitting: Emergency Medicine

## 2014-08-20 DIAGNOSIS — Z79899 Other long term (current) drug therapy: Secondary | ICD-10-CM | POA: Diagnosis not present

## 2014-08-20 DIAGNOSIS — E7800 Pure hypercholesterolemia, unspecified: Secondary | ICD-10-CM | POA: Diagnosis present

## 2014-08-20 DIAGNOSIS — A09 Infectious gastroenteritis and colitis, unspecified: Secondary | ICD-10-CM | POA: Diagnosis not present

## 2014-08-20 DIAGNOSIS — F329 Major depressive disorder, single episode, unspecified: Secondary | ICD-10-CM | POA: Diagnosis present

## 2014-08-20 DIAGNOSIS — Z96659 Presence of unspecified artificial knee joint: Secondary | ICD-10-CM | POA: Diagnosis present

## 2014-08-20 DIAGNOSIS — J449 Chronic obstructive pulmonary disease, unspecified: Secondary | ICD-10-CM | POA: Diagnosis present

## 2014-08-20 DIAGNOSIS — A047 Enterocolitis due to Clostridium difficile: Principal | ICD-10-CM | POA: Diagnosis present

## 2014-08-20 DIAGNOSIS — Z720 Tobacco use: Secondary | ICD-10-CM | POA: Diagnosis present

## 2014-08-20 DIAGNOSIS — J441 Chronic obstructive pulmonary disease with (acute) exacerbation: Secondary | ICD-10-CM | POA: Diagnosis present

## 2014-08-20 DIAGNOSIS — E785 Hyperlipidemia, unspecified: Secondary | ICD-10-CM | POA: Diagnosis not present

## 2014-08-20 DIAGNOSIS — G8929 Other chronic pain: Secondary | ICD-10-CM | POA: Diagnosis present

## 2014-08-20 DIAGNOSIS — F17210 Nicotine dependence, cigarettes, uncomplicated: Secondary | ICD-10-CM | POA: Diagnosis present

## 2014-08-20 DIAGNOSIS — R197 Diarrhea, unspecified: Secondary | ICD-10-CM | POA: Diagnosis present

## 2014-08-20 DIAGNOSIS — E039 Hypothyroidism, unspecified: Secondary | ICD-10-CM | POA: Diagnosis not present

## 2014-08-20 DIAGNOSIS — M549 Dorsalgia, unspecified: Secondary | ICD-10-CM | POA: Diagnosis present

## 2014-08-20 DIAGNOSIS — A0472 Enterocolitis due to Clostridium difficile, not specified as recurrent: Secondary | ICD-10-CM

## 2014-08-20 DIAGNOSIS — M1990 Unspecified osteoarthritis, unspecified site: Secondary | ICD-10-CM | POA: Diagnosis present

## 2014-08-20 DIAGNOSIS — I10 Essential (primary) hypertension: Secondary | ICD-10-CM | POA: Diagnosis not present

## 2014-08-20 DIAGNOSIS — I959 Hypotension, unspecified: Secondary | ICD-10-CM | POA: Diagnosis not present

## 2014-08-20 DIAGNOSIS — K6389 Other specified diseases of intestine: Secondary | ICD-10-CM | POA: Diagnosis not present

## 2014-08-20 DIAGNOSIS — M5136 Other intervertebral disc degeneration, lumbar region: Secondary | ICD-10-CM | POA: Diagnosis present

## 2014-08-20 DIAGNOSIS — Z90710 Acquired absence of both cervix and uterus: Secondary | ICD-10-CM

## 2014-08-20 DIAGNOSIS — E038 Other specified hypothyroidism: Secondary | ICD-10-CM | POA: Diagnosis not present

## 2014-08-20 DIAGNOSIS — K559 Vascular disorder of intestine, unspecified: Secondary | ICD-10-CM | POA: Diagnosis present

## 2014-08-20 DIAGNOSIS — M5442 Lumbago with sciatica, left side: Secondary | ICD-10-CM | POA: Diagnosis present

## 2014-08-20 DIAGNOSIS — Z9981 Dependence on supplemental oxygen: Secondary | ICD-10-CM

## 2014-08-20 DIAGNOSIS — A0471 Enterocolitis due to Clostridium difficile, recurrent: Secondary | ICD-10-CM | POA: Insufficient documentation

## 2014-08-20 DIAGNOSIS — E876 Hypokalemia: Secondary | ICD-10-CM | POA: Diagnosis present

## 2014-08-20 HISTORY — DX: Other chronic pain: G89.29

## 2014-08-20 HISTORY — DX: Chronic obstructive pulmonary disease, unspecified: J44.9

## 2014-08-20 HISTORY — DX: Essential (primary) hypertension: I10

## 2014-08-20 HISTORY — DX: Hypothyroidism, unspecified: E03.9

## 2014-08-20 HISTORY — DX: Other intervertebral disc degeneration, lumbar region without mention of lumbar back pain or lower extremity pain: M51.369

## 2014-08-20 HISTORY — DX: Dependence on supplemental oxygen: Z99.81

## 2014-08-20 HISTORY — DX: Other intervertebral disc degeneration, lumbar region: M51.36

## 2014-08-20 HISTORY — DX: Adverse effect of unspecified anesthetic, initial encounter: T41.45XA

## 2014-08-20 HISTORY — DX: Unspecified osteoarthritis, unspecified site: M19.90

## 2014-08-20 HISTORY — DX: Low back pain: M54.5

## 2014-08-20 HISTORY — DX: Pure hypercholesterolemia, unspecified: E78.00

## 2014-08-20 HISTORY — DX: Low back pain, unspecified: M54.50

## 2014-08-20 HISTORY — DX: Major depressive disorder, single episode, unspecified: F32.9

## 2014-08-20 HISTORY — DX: Depression, unspecified: F32.A

## 2014-08-20 HISTORY — DX: Other complications of anesthesia, initial encounter: T88.59XA

## 2014-08-20 LAB — CBC WITH DIFFERENTIAL/PLATELET
Basophils Absolute: 0 10*3/uL (ref 0.0–0.1)
Basophils Relative: 0 % (ref 0–1)
Eosinophils Absolute: 0.1 10*3/uL (ref 0.0–0.7)
Eosinophils Relative: 1 % (ref 0–5)
HCT: 36.9 % (ref 36.0–46.0)
Hemoglobin: 12.1 g/dL (ref 12.0–15.0)
Lymphocytes Relative: 17 % (ref 12–46)
Lymphs Abs: 1.7 10*3/uL (ref 0.7–4.0)
MCH: 29.5 pg (ref 26.0–34.0)
MCHC: 32.8 g/dL (ref 30.0–36.0)
MCV: 90 fL (ref 78.0–100.0)
Monocytes Absolute: 1 10*3/uL (ref 0.1–1.0)
Monocytes Relative: 10 % (ref 3–12)
Neutro Abs: 7.4 10*3/uL (ref 1.7–7.7)
Neutrophils Relative %: 72 % (ref 43–77)
Platelets: 423 10*3/uL — ABNORMAL HIGH (ref 150–400)
RBC: 4.1 MIL/uL (ref 3.87–5.11)
RDW: 13.9 % (ref 11.5–15.5)
WBC: 10.2 10*3/uL (ref 4.0–10.5)

## 2014-08-20 LAB — COMPREHENSIVE METABOLIC PANEL
ALT: 13 U/L (ref 0–35)
AST: 21 U/L (ref 0–37)
Albumin: 2.8 g/dL — ABNORMAL LOW (ref 3.5–5.2)
Alkaline Phosphatase: 123 U/L — ABNORMAL HIGH (ref 39–117)
Anion gap: 10 (ref 5–15)
BUN: 8 mg/dL (ref 6–23)
CO2: 25 mmol/L (ref 19–32)
Calcium: 8.7 mg/dL (ref 8.4–10.5)
Chloride: 100 mEq/L (ref 96–112)
Creatinine, Ser: 0.77 mg/dL (ref 0.50–1.10)
GFR calc Af Amer: >90 mL/min (ref >90)
GFR calc non Af Amer: 81 mL/min — ABNORMAL LOW (ref >90)
Glucose, Bld: 107 mg/dL — ABNORMAL HIGH (ref 70–99)
Potassium: 3.7 mmol/L (ref 3.5–5.1)
Sodium: 135 mmol/L (ref 135–145)
Total Bilirubin: 0.5 mg/dL (ref 0.3–1.2)
Total Protein: 6 g/dL (ref 6.0–8.3)

## 2014-08-20 LAB — URINALYSIS, ROUTINE W REFLEX MICROSCOPIC
Glucose, UA: NEGATIVE mg/dL
Hgb urine dipstick: NEGATIVE
Ketones, ur: 15 mg/dL — AB
Leukocytes, UA: NEGATIVE
Nitrite: NEGATIVE
Protein, ur: NEGATIVE mg/dL
Specific Gravity, Urine: 1.014 (ref 1.005–1.030)
Urobilinogen, UA: 1 mg/dL (ref 0.0–1.0)
pH: 6 (ref 5.0–8.0)

## 2014-08-20 LAB — LIPASE, BLOOD: Lipase: 18 U/L (ref 11–59)

## 2014-08-20 MED ORDER — ACIDOPHILUS PO CAPS: 1.0000 | ORAL_CAPSULE | Freq: Every day | ORAL | Status: DC

## 2014-08-20 MED ORDER — RISAQUAD PO CAPS
1.0000 | ORAL_CAPSULE | Freq: Every day | ORAL | Status: DC
  Administered 2014-08-21 – 2014-08-28 (×8): 1 via ORAL
  Filled 2014-08-20 (×10): qty 1

## 2014-08-20 MED ORDER — ONDANSETRON HCL 4 MG/2ML IJ SOLN
4.0000 mg | Freq: Four times a day (QID) | INTRAMUSCULAR | Status: DC | PRN
  Administered 2014-08-21 – 2014-08-22 (×2): 4 mg via INTRAVENOUS
  Filled 2014-08-20 (×2): qty 2

## 2014-08-20 MED ORDER — SODIUM CHLORIDE 0.9 % IV BOLUS (SEPSIS)
1000.0000 mL | Freq: Once | INTRAVENOUS | Status: AC
  Administered 2014-08-20: 1000 mL via INTRAVENOUS

## 2014-08-20 MED ORDER — LEVOTHYROXINE SODIUM 88 MCG PO TABS
88.0000 ug | ORAL_TABLET | Freq: Every day | ORAL | Status: DC
  Administered 2014-08-21 – 2014-08-28 (×8): 88 ug via ORAL
  Filled 2014-08-20 (×9): qty 1

## 2014-08-20 MED ORDER — ONDANSETRON HCL 4 MG PO TABS
4.0000 mg | ORAL_TABLET | Freq: Four times a day (QID) | ORAL | Status: DC | PRN
  Administered 2014-08-25: 4 mg via ORAL
  Filled 2014-08-20 (×2): qty 1

## 2014-08-20 MED ORDER — ONDANSETRON HCL 4 MG/2ML IJ SOLN
4.0000 mg | Freq: Once | INTRAMUSCULAR | Status: AC
  Administered 2014-08-20: 4 mg via INTRAVENOUS
  Filled 2014-08-20: qty 2

## 2014-08-20 MED ORDER — HEPARIN SODIUM (PORCINE) 5000 UNIT/ML IJ SOLN
5000.0000 [IU] | Freq: Three times a day (TID) | INTRAMUSCULAR | Status: DC
  Administered 2014-08-20 – 2014-08-28 (×23): 5000 [IU] via SUBCUTANEOUS
  Filled 2014-08-20 (×27): qty 1

## 2014-08-20 MED ORDER — ATORVASTATIN CALCIUM 10 MG PO TABS
10.0000 mg | ORAL_TABLET | Freq: Every evening | ORAL | Status: DC
  Administered 2014-08-21 – 2014-08-27 (×8): 10 mg via ORAL
  Filled 2014-08-20 (×9): qty 1

## 2014-08-20 MED ORDER — METOPROLOL TARTRATE 50 MG PO TABS
50.0000 mg | ORAL_TABLET | Freq: Every day | ORAL | Status: DC
  Administered 2014-08-21: 50 mg via ORAL
  Filled 2014-08-20: qty 1

## 2014-08-20 MED ORDER — NICOTINE 21 MG/24HR TD PT24
21.0000 mg | MEDICATED_PATCH | Freq: Every day | TRANSDERMAL | Status: DC
  Administered 2014-08-21 – 2014-08-27 (×8): 21 mg via TRANSDERMAL
  Filled 2014-08-20 (×10): qty 1

## 2014-08-20 MED ORDER — PANTOPRAZOLE SODIUM 40 MG PO TBEC
40.0000 mg | DELAYED_RELEASE_TABLET | Freq: Every day | ORAL | Status: DC
  Administered 2014-08-21 – 2014-08-23 (×3): 40 mg via ORAL
  Filled 2014-08-20 (×3): qty 1

## 2014-08-20 MED ORDER — HYDROCODONE-ACETAMINOPHEN 5-325 MG PO TABS
1.0000 | ORAL_TABLET | ORAL | Status: DC | PRN
  Administered 2014-08-20 – 2014-08-22 (×5): 2 via ORAL
  Administered 2014-08-23 (×3): 1 via ORAL
  Administered 2014-08-24 – 2014-08-26 (×4): 2 via ORAL
  Administered 2014-08-27 (×2): 1 via ORAL
  Filled 2014-08-20: qty 1
  Filled 2014-08-20 (×4): qty 2
  Filled 2014-08-20: qty 1
  Filled 2014-08-20 (×2): qty 2
  Filled 2014-08-20: qty 1
  Filled 2014-08-20 (×2): qty 2
  Filled 2014-08-20 (×2): qty 1
  Filled 2014-08-20 (×2): qty 2

## 2014-08-20 MED ORDER — VENLAFAXINE HCL ER 150 MG PO CP24
150.0000 mg | ORAL_CAPSULE | Freq: Every day | ORAL | Status: DC
  Administered 2014-08-21 – 2014-08-28 (×8): 150 mg via ORAL
  Filled 2014-08-20 (×10): qty 1

## 2014-08-20 MED ORDER — SODIUM CHLORIDE 0.9 % IV SOLN
INTRAVENOUS | Status: DC
  Administered 2014-08-20 – 2014-08-21 (×2): via INTRAVENOUS

## 2014-08-20 NOTE — ED Notes (Signed)
Hospitalist at bedside 

## 2014-08-20 NOTE — ED Provider Notes (Signed)
CSN: [id number]     Arrival date & time 08/20/14  1551 History   First MD Initiated Contact with Patient 08/20/14 1655     Chief Complaint  Patient presents with  . Diarrhea  . Weakness     (Consider location/radiation/quality/duration/timing/severity/associated sxs/prior Treatment) HPI   73yF with diarrhea. Pertinent recent history includes hospitalized from 06/19/2014 through 06/23/2014 with discharge diagnosis of ileocolitis, lowered gastrointestinal bleed likely from colitis. She had elevated WBC felt to be secondary to inflammatory process. She was treated with Cipro and Flagyl. The CT scan showed evidence of ileocolitis. Stool studies were negative for C difficile and comprehensive culture. She was sent home on the antibiotics and prednisone taper.  Patient finished antibiotics and prednisone with no improvement of symptoms. She was given another course of prednisone. She states when she was on the prednisone it really did not help her stools. On 12/8 she was diagnosed with c-diff and started on vancomycin 250mg  QID, but says she was told to stop this on 12/16. "They me to stop because they said I should have noticed a difference after 3-5 days and there was no point in keep taking it." Mentions at one point that at one point she was actually getting constipated from taking a large amount of immodium to the point of then being prescribed senna.She is currently not taking abx, steroids or antimotility agent. Is taking acidophilus.    Approximately two weeks ago she had a colonoscopy which was consistent with ischemic colitis. Had fecal transplant at that time.  No significant change in symptoms since then.  Currently having 3-4 loose/mushy stools within the first 2 hours after she wakes up and any time she eats anything. Not waking up at night to go. Stool brown with occasional mucus/pink material. Denies any significant pain. Nausea. No vomiting.    Past Medical History  Diagnosis Date  .  COPD (chronic obstructive pulmonary disease)    History reviewed. No pertinent past surgical history. History reviewed. No pertinent family history. History  Substance Use Topics  . Smoking status: Never Smoker   . Smokeless tobacco: Not on file  . Alcohol Use: No   OB History    No data available     Review of Systems  All systems reviewed and negative, other than as noted in HPI.   Allergies  Review of patient's allergies indicates no known allergies.  Home Medications   Prior to Admission medications   Not on File   BP 114/42 mmHg  Pulse 92  Temp(Src) 98.3 F (36.8 C) (Oral)  Resp 15  SpO2 100% Physical Exam  Constitutional: She appears well-developed and well-nourished. No distress.  HENT:  Head: Normocephalic and atraumatic.  Eyes: Conjunctivae are normal. Right eye exhibits no discharge. Left eye exhibits no discharge.  Neck: Neck supple.  Cardiovascular: Normal rate, regular rhythm and normal heart sounds.  Exam reveals no gallop and no friction rub.   No murmur heard. Pulmonary/Chest: Effort normal and breath sounds normal. No respiratory distress.  Abdominal: Soft. She exhibits no distension. There is no tenderness.  Musculoskeletal: She exhibits no edema or tenderness.  Neurological: She is alert.  Skin: Skin is warm and dry.  Psychiatric: She has a normal mood and affect. Her behavior is normal. Thought content normal.  Nursing note and vitals reviewed.   ED Course  Procedures (including critical care time) Labs Review Labs Reviewed  CBC WITH DIFFERENTIAL - Abnormal; Notable for the following:    Platelets 423 (*)  All other components within normal limits  COMPREHENSIVE METABOLIC PANEL - Abnormal; Notable for the following:    Glucose, Bld 107 (*)    Albumin 2.8 (*)    Alkaline Phosphatase 123 (*)    GFR calc non Af Amer 81 (*)    All other components within normal limits  LIPASE, BLOOD  GI PATHOGEN PANEL BY PCR, STOOL  URINALYSIS,  ROUTINE W REFLEX MICROSCOPIC    Imaging Review No results found.   EKG Interpretation None      MDM   Final diagnoses:  Diarrhea    73yF with ongoing diarrhea. Able to view some records through "Care Everywhere" such as telephone correspondence on 12/8 informing that cdiff positive, but not able to review all notes, hospitalizations, colonosopy reports etc. She did not complete therapy for cdiff. Concerned that her symptoms may be secondary to this. Some hypotension in the emergency room. Her symptoms have not significantly improved. Feel at this point admission would be prudent.    Virgel Manifold, MD 08/24/14 [phone]

## 2014-08-20 NOTE — ED Notes (Signed)
EDP at bedside  

## 2014-08-20 NOTE — ED Notes (Signed)
Pt has not had a BM since being here. Does report nausea. Given meds.

## 2014-08-20 NOTE — H&P (Signed)
[facility] History and Physical  [name]:[id number] DOB: 1941/02/26 DOA: 08/20/2014  Referring physician: ED physician PCP: Lavera Guise, MD  Specialists:   Chief Complaint: Diarrhea  HPI: [name] Douglas is a 74 y.o. female past medical history of attention, hyperlipidemia, hypothyroidism, COPD, tobacco abuse, who presented with persistent diarrhea.  Patient reports that she was hospitalized due to rectal bleeding from 06/19/2014 through 06/23/2014 in [place]. She had CT scan which showed Ileocolitis. She was treated with prednisone, ciprofloxacin and Flagyl, but without significant improvement. She still has abdominal pain, watery diarrhea, with mucus in stools. On 07/26/14, she was tested positive for C. difficile colitis by GI in [facility]. She was started with Florasta and vancomycin, but still no improvement. Because of persistent symptoms, she was hospitalized again on 12/18. She had colonoscopyl, which showed diffuse severe inflammation in sigmoid colon and in descending colon secondary to ischemic colitis. Fecal Microbiota transplant was performed. Vancomycin was discontinued, and she was started with lactobacillus and discharged home. Currently patient is not taking any antibiotics. She still has abdominal pain, watery diarrhea with mucus in the stools. Patient feels very weak. Because of persistent symptoms, patient comes to the hospital for further evaluation and treatment. Patient's daughter is working with [facility] and would like to get [facility] GI consultation.   Work up in the ED demonstrates negative lipase, no leukocytosis, electrolytes okay. Patient is admitted to inpatient for further evaluation treatment.  Review of Systems: As presented in the history of presenting illness, rest negative.  Where does patient live?  At home Can patient participate in ADLs? Yes  Allergy: No Known Allergies  Past Medical History  Diagnosis Date  . COPD  (chronic obstructive pulmonary disease)     History reviewed. No pertinent past surgical history.  Social History:  reports that she has never smoked. She does not have any smokeless tobacco history on file. She reports that she does not drink alcohol or use illicit drugs.  Family History:  Family History  Problem Relation Age of Onset  . Diabetes Mother   . Hypertension Mother   . Lung cancer Mother   . Congestive Heart Failure Father   . Congestive Heart Failure Sister      Prior to Admission medications   Medication Sig Start Date End Date Taking? Authorizing Provider  amLODipine (NORVASC) 10 MG tablet Take 10 mg by mouth daily.   Yes Historical Provider, MD  atorvastatin (LIPITOR) 10 MG tablet Take 10 mg by mouth every evening.   Yes Historical Provider, MD  Lactobacillus (ACIDOPHILUS PO) Take by mouth.   Yes Historical Provider, MD  levothyroxine (SYNTHROID, LEVOTHROID) 88 MCG tablet Take 88 mcg by mouth daily before breakfast.   Yes Historical Provider, MD  metoprolol (LOPRESSOR) 50 MG tablet Take 50 mg by mouth daily.   Yes Historical Provider, MD  venlafaxine XR (EFFEXOR-XR) 150 MG 24 hr capsule Take 150 mg by mouth daily with breakfast. Also take a 75mg  tablet per daughter   Yes Historical Provider, MD  venlafaxine XR (EFFEXOR-XR) 75 MG 24 hr capsule Take 75 mg by mouth daily with breakfast. Also takes a 150mg  tablet per daughter   Yes Historical Provider, MD    Physical Exam: Filed Vitals:   08/20/14 2052 08/20/14 2115 08/20/14 2130 08/20/14 2218  BP: 117/43 126/49 114/88 125/59  Pulse: 95 94 96 106  Temp:    97.7 F (36.5 C)  TempSrc:    Oral  Resp: 23  23 18  SpO2: 98% 95% 95% 98%   General: Not in acute distress HEENT:       Eyes: PERRL, EOMI, no scleral icterus       ENT: No discharge from the ears and nose, no pharynx injection, no tonsillar enlargement.        Neck: No JVD, no bruit, no mass felt. Cardiac: S1/S2, RRR, No murmurs, No gallops or rubs Pulm:  Good air movement bilaterally. Clear to auscultation bilaterally. No rales, wheezing, rhonchi or rubs. Abd: Soft, nondistended, mild and diffused tenderness, no rebound pain, no organomegaly, BS present Ext: No edema bilaterally. 2+DP/PT pulse bilaterally Musculoskeletal: No joint deformities, erythema, or stiffness, ROM full Skin: No rashes.  Neuro: Alert and oriented X3, cranial nerves II-XII grossly intact, muscle strength 5/5 in all extremeties, sensation to light touch intact.  Psych: Patient is not psychotic, no suicidal or hemocidal ideation.  Labs on Admission:  Basic Metabolic Panel:  Recent Labs Lab 08/20/14 1603  NA 135  K 3.7  CL 100  CO2 25  GLUCOSE 107*  BUN 8  CREATININE 0.77  CALCIUM 8.7   Liver Function Tests:  Recent Labs Lab 08/20/14 1603  AST 21  ALT 13  ALKPHOS 123*  BILITOT 0.5  PROT 6.0  ALBUMIN 2.8*    Recent Labs Lab 08/20/14 1603  LIPASE 18   No results for input(s): AMMONIA in the last 168 hours. CBC:  Recent Labs Lab 08/20/14 1603  WBC 10.2  NEUTROABS 7.4  HGB 12.1  HCT 36.9  MCV 90.0  PLT 423*   Cardiac Enzymes: No results for input(s): CKTOTAL, CKMB, CKMBINDEX, TROPONINI in the last 168 hours.  BNP (last 3 results) No results for input(s): PROBNP in the last 8760 hours. CBG: No results for input(s): GLUCAP in the last 168 hours.  Radiological Exams on Admission: No results found.  EKG: will get one  Assessment/Plan Principal Problem:   Diarrhea Active Problems:   HTN (hypertension)   HLD (hyperlipidemia)   Hypothyroidism   Tobacco abuse   COPD (chronic obstructive pulmonary disease)   Back pain  Diarrhea: Most likely due to C. difficile colitis (per patient, she  was C diff positive). Patient did not respond to Flagyl and vancomycin. I discussed with patient about choice of another antibiotics, Fidaxomicin and CT-abdomen, Patient would like to get GI consult first. Currently patient is hemodynamically stable,  not septic. Electrolytes okay. Patient's daughter is working with [facility] and would like to get [facility] GI consultation.   -will admitted to the MedSurg bed and -Check GI pathogen panel, C. difficile PCR, stool culture, ova/parasites - blood culture x2 -Treated patient symptomatically: Norco for pain, Zofran for nausea  - start Protonix - IVF: NS 125cc/h -continue risaquad  Hypertension: -Continue metoprolol -Hold amlodipine given soft blood pressure  Hypothyroidism: Patient is on Synthroid at home. No TSH on record. -Continue Synthroid -Check TSH  Hyperlipidemia: On Lipitor at home -Continue Lipitor -Check lipid profile  COPD: Stable, not on medications -Monitor closely  Tobacco abuse: -Nicotine patch   DVT ppx: SQ Heparin       Code Status: Full code Family Communication:   Yes, patient's daughter      at bed side Disposition Plan: Admit to inpatient   Date of Service 08/20/2014    Ivor Costa [facility] Pager [phone]  If 7PM-7AM, please contact night-coverage [email or web address] Password [email or web address] 08/20/2014, 10:20 PM

## 2014-08-20 NOTE — ED Notes (Signed)
Pt c/o hx of cdiff x several months; pt has been treated c/o diarrhea, N/V and weakness; pt c/o back pain from fall 3 weeks ago

## 2014-08-21 ENCOUNTER — Encounter (HOSPITAL_COMMUNITY): Payer: Self-pay | Admitting: Radiology

## 2014-08-21 ENCOUNTER — Inpatient Hospital Stay (HOSPITAL_COMMUNITY): Payer: Medicare Other

## 2014-08-21 DIAGNOSIS — E038 Other specified hypothyroidism: Secondary | ICD-10-CM

## 2014-08-21 LAB — CBC
HCT: 34 % — ABNORMAL LOW (ref 36.0–46.0)
Hemoglobin: 11.1 g/dL — ABNORMAL LOW (ref 12.0–15.0)
MCH: 29.4 pg (ref 26.0–34.0)
MCHC: 32.6 g/dL (ref 30.0–36.0)
MCV: 90.2 fL (ref 78.0–100.0)
Platelets: 375 10*3/uL (ref 150–400)
RBC: 3.77 MIL/uL — ABNORMAL LOW (ref 3.87–5.11)
RDW: 14 % (ref 11.5–15.5)
WBC: 6.8 10*3/uL (ref 4.0–10.5)

## 2014-08-21 LAB — BASIC METABOLIC PANEL
Anion gap: 12 (ref 5–15)
BUN: 5 mg/dL — ABNORMAL LOW (ref 6–23)
CO2: 20 mmol/L (ref 19–32)
Calcium: 8.2 mg/dL — ABNORMAL LOW (ref 8.4–10.5)
Chloride: 106 mEq/L (ref 96–112)
Creatinine, Ser: 0.62 mg/dL (ref 0.50–1.10)
GFR calc Af Amer: >90 mL/min (ref >90)
GFR calc non Af Amer: 87 mL/min — ABNORMAL LOW (ref >90)
Glucose, Bld: 82 mg/dL (ref 70–99)
Potassium: 3.4 mmol/L — ABNORMAL LOW (ref 3.5–5.1)
Sodium: 138 mmol/L (ref 135–145)

## 2014-08-21 LAB — GLUCOSE, CAPILLARY: Glucose-Capillary: 87 mg/dL (ref 70–99)

## 2014-08-21 LAB — TSH: TSH: 8.65 u[IU]/mL — ABNORMAL HIGH (ref 0.350–4.500)

## 2014-08-21 LAB — PROTIME-INR
INR: 1 (ref 0.00–1.49)
Prothrombin Time: 13.3 seconds (ref 11.6–15.2)

## 2014-08-21 LAB — CLOSTRIDIUM DIFFICILE BY PCR: Toxigenic C. Difficile by PCR: POSITIVE — AB

## 2014-08-21 MED ORDER — POTASSIUM CHLORIDE 10 MEQ/100ML IV SOLN
10.0000 meq | INTRAVENOUS | Status: AC
  Administered 2014-08-21 (×4): 10 meq via INTRAVENOUS
  Filled 2014-08-21 (×4): qty 100

## 2014-08-21 MED ORDER — ALBUTEROL SULFATE (2.5 MG/3ML) 0.083% IN NEBU: 2.5000 mg | INHALATION_SOLUTION | RESPIRATORY_TRACT | Status: DC | PRN

## 2014-08-21 MED ORDER — SODIUM CHLORIDE 0.9 % IV SOLN
INTRAVENOUS | Status: DC
  Administered 2014-08-21: 21:00:00 via INTRAVENOUS
  Administered 2014-08-23: 1000 mL via INTRAVENOUS
  Administered 2014-08-23 – 2014-08-28 (×8): via INTRAVENOUS

## 2014-08-21 MED ORDER — METOPROLOL TARTRATE 50 MG PO TABS
50.0000 mg | ORAL_TABLET | Freq: Every day | ORAL | Status: DC
  Administered 2014-08-22 – 2014-08-28 (×6): 50 mg via ORAL
  Filled 2014-08-21 (×7): qty 1

## 2014-08-21 MED ORDER — IOHEXOL 300 MG/ML  SOLN
100.0000 mL | Freq: Once | INTRAMUSCULAR | Status: AC | PRN
  Administered 2014-08-21: 100 mL via INTRAVENOUS

## 2014-08-21 MED ORDER — IOHEXOL 300 MG/ML  SOLN
25.0000 mL | INTRAMUSCULAR | Status: AC
  Administered 2014-08-21 (×2): 25 mL via ORAL

## 2014-08-21 NOTE — Progress Notes (Signed)
Physical Therapy Evaluation Patient Details Name: [name] Douglas MRN: [id number] DOB: [birth year] Today's Date: 08/21/2014   History of Present Illness  Patient is a 74 yo female admitted 08/20/14 with persistent diarrhea and colitis.  Patient has had 2 other hospitalizations for this issue since 06/19/14.  Patient with weight loss and general weakness.  PMH:  HLD, Hypothyroidism, COPD, HTN, back pain.  Clinical Impression  Patient presents with problems listed below.  Will benefit from acute PT to maximize independence prior to discharge.  Patient wants to return to her home, however does not have 24 hour assist.  Will need to achieve Mod I level for functional mobility to return home.    Follow Up Recommendations Home health PT;Supervision - Intermittent (May need to consider SNF if slow to progress.)    Equipment Recommendations  None recommended by PT    Recommendations for Other Services       Precautions / Restrictions Precautions Precautions: Fall Precaution Comments: Patient reports 1 fall in early December.  "Not sure what happened".  Patient became "faint" during gait on evaluation. Restrictions Weight Bearing Restrictions: No      Mobility  Bed Mobility Overal bed mobility: Needs Assistance Bed Mobility: Supine to Sit;Sit to Supine     Supine to sit: Supervision Sit to supine: Supervision   General bed mobility comments: Patient using bed rail to move supine <> sit.  Required increased time.  Transfers Overall transfer level: Needs assistance Equipment used: None Transfers: Sit to/from Stand Sit to Stand: Min guard         General transfer comment: Verbal cues for hand placement.  Assist for safety.  Ambulation/Gait Ambulation/Gait assistance: Min assist Ambulation Distance (Feet): 20 Feet Assistive device: None Gait Pattern/deviations: Step-through pattern;Decreased stride length;Shuffle;Staggering left;Staggering right;Trunk flexed     General Gait  Details: Patient with unsteady gait initially, requiring min assist to prevent loss of balance.  After 10', patient became lightheaded and pale.  Returned patient to sitting EOB.  Patient continued to feel lightheaded - returned to supine.  Color returned to lips/face after 5-6 minutes in supine.  Patient reports she felt "faint" and "weak".  Stairs            Wheelchair Mobility    Modified Rankin (Stroke Patients Only)       Balance                                             Pertinent Vitals/Pain Pain Assessment: 0-10 Pain Score: 3  Pain Location: Abdomen Pain Descriptors / Indicators: Aching Pain Intervention(s): Monitored during session    Home Living Family/patient expects to be discharged to:: Private residence Living Arrangements: Alone Available Help at Discharge: Family;Available PRN/intermittently (2 daughters work) Type of Home: Apartment Home Access: Elevator     Home Layout: One level Home Equipment: Environmental consultant - 2 wheels;Cane - single point (outdoor table used as seat in shower)      Prior Function Level of Independence: Independent         Comments: Was driving prior to 30/09/23 when abdominal illness began.     Hand Dominance        Extremity/Trunk Assessment   Upper Extremity Assessment: Generalized weakness           Lower Extremity Assessment: Generalized weakness         Communication   Communication: No  difficulties  Cognition Arousal/Alertness: Awake/alert Behavior During Therapy: WFL for tasks assessed/performed Overall Cognitive Status: Within Functional Limits for tasks assessed                      General Comments      Exercises        Assessment/Plan    PT Assessment Patient needs continued PT services  PT Diagnosis Difficulty walking;Abnormality of gait;Generalized weakness;Acute pain   PT Problem List Decreased strength;Decreased activity tolerance;Decreased balance;Decreased  mobility;Decreased knowledge of use of DME;Pain  PT Treatment Interventions DME instruction;Gait training;Functional mobility training;Therapeutic activities;Therapeutic exercise;Balance training;Patient/family education   PT Goals (Current goals can be found in the Care Plan section) Acute Rehab PT Goals Patient Stated Goal: To get stronger.  To return home PT Goal Formulation: With patient/family Time For Goal Achievement: 08/28/14 Potential to Achieve Goals: Good    Frequency Min 3X/week   Barriers to discharge Decreased caregiver support Patient lives alone and does not have 24 hour assist.    Co-evaluation               End of Session Equipment Utilized During Treatment: Gait belt Activity Tolerance: Patient limited by fatigue Patient left: in bed;with call bell/phone within reach;with bed alarm set;with family/visitor present Nurse Communication: Mobility status (Feeling lightheaded with very short amb.  Use BSC.)         Time: 9983-3825 PT Time Calculation (min) (ACUTE ONLY): 22 min   Charges:   PT Evaluation $Initial PT Evaluation Tier I: 1 Procedure PT Treatments $Gait Training: 8-22 mins   PT G Codes:        Despina Pole September 19, 2014, 1:44 PM Carita Pian. Sanjuana Kava, [place] Pager [phone]

## 2014-08-21 NOTE — Progress Notes (Signed)
INITIAL NUTRITION ASSESSMENT  DOCUMENTATION CODES Per approved criteria  -Obesity Unspecified   INTERVENTION: Diet advancement per MD Recommend Ensure Pudding BID, provides 170 kcal and 4 grams of protein RD to monitor for PO adequacy   NUTRITION DIAGNOSIS: Inadequate oral intake related to diarrhea as evidenced by pt's report of 21 lbs weight loss in 2 months.   Goal: Pt to meet >/= 90% of their estimated nutrition needs   Monitor:  Diet advancement, bowel function, weight trend, PO intake, labs  Reason for Assessment: Malnutrition Screening Tool, score of 3  74 y.o. female  Admitting Dx: Diarrhea  ASSESSMENT: 74 y.o. female past medical history of hyperlipidemia, hypothyroidism, COPD, tobacco abuse, who presented with persistent diarrhea.  Per chart pt has been having persistent diarrhea for 2 months, up to 10 times per day. Pt out of room at time of visit; remains NPO. Per Malnutrition Screening Tool filed in chart, pt reported 21 lbs weight loss since 06/19/14 and eating poorly due to decreased appetite.  Based on pt's report of 21 lbs weight loss, she used to weigh 205 lbs and has lost 10% of her body weight in the past 2 months.   Labs reviewed.   Height: Ht Readings from Last 1 Encounters:  08/20/14 5\' 4"  (1.626 m)    Weight: Wt Readings from Last 1 Encounters:  08/21/14 184 lb 15.5 oz (83.9 kg)    Ideal Body Weight: 120 lbs  % Ideal Body Weight: 153%  Wt Readings from Last 10 Encounters:  08/21/14 184 lb 15.5 oz (83.9 kg)    Usual Body Weight: unknown  % Usual Body Weight: NA  BMI:  Body mass index is 31.73 kg/(m^2).  Estimated Nutritional Needs: Kcal: 1600-1800 Protein: 85-95 grams Fluid: 1.8 L/day  Skin: WDL  Diet Order: Diet NPO time specified Except for: Sips with Meds  EDUCATION NEEDS: -No education needs identified at this time   Intake/Output Summary (Last 24 hours) at 08/21/14 1724 Last data filed at 08/21/14 1559  Gross per 24  hour  Intake 760.83 ml  Output    900 ml  Net -139.17 ml    Last BM: 1/2   Labs:   Recent Labs Lab 08/20/14 1603 08/21/14 0433  NA 135 138  K 3.7 3.4*  CL 100 106  CO2 25 20  BUN 8 5*  CREATININE 0.77 0.62  CALCIUM 8.7 8.2*  GLUCOSE 107* 82    CBG (last 3)   Recent Labs  08/21/14 0831  GLUCAP 87    Scheduled Meds: . acidophilus  1 capsule Oral Daily  . atorvastatin  10 mg Oral QPM  . heparin  5,000 Units Subcutaneous 3 times per day  . levothyroxine  88 mcg Oral QAC breakfast  . [START ON 08/22/2014] metoprolol  50 mg Oral Daily  . nicotine  21 mg Transdermal Daily  . pantoprazole  40 mg Oral Q1200  . venlafaxine XR  150 mg Oral Q breakfast    Continuous Infusions: . sodium chloride 75 mL/hr at 08/21/14 1431    Past Medical History  Diagnosis Date  . COPD (chronic obstructive pulmonary disease)   . Complication of anesthesia     "agitated & restless after knee replacement in 2008"  . Hypertension   . High cholesterol   . On home oxygen therapy     "2L at night" (08/20/2014)  . Hypothyroidism   . Arthritis     "shoulders" (08/20/2014)  . Chronic lower back pain   . DDD (  degenerative disc disease), lumbar   . Depression     Past Surgical History  Procedure Laterality Date  . Tonsillectomy  ~ 1950  . Excisional hemorrhoidectomy  1960's  . Total knee arthroplasty Left 2008  . Joint replacement    . Knee arthroscopy Left X 2 <2008  . Vaginal hysterectomy  1974  . Tubal ligation  1973  . Cardiac catheterization  ~ 2007  . Shoulder open rotator cuff repair Right 2001    Pryor Ochoa RD, LDN Inpatient Clinical Dietitian Pager: [phone] After Hours Pager: [phone]

## 2014-08-21 NOTE — Progress Notes (Signed)
[facility] PROGRESS NOTE  [name] Douglas YIR:[id number] DOB: 1941-07-26 DOA: 08/20/2014 PCP: Lavera Guise, MD  Assessment/Plan: 1-Persistent Diarrhea: -On October she was treated with cipro and flagyl and prednisone taper for colitis..On 12-07 diagnosed with C diff . Treated with florasta, vancomycin for 7 days , and fecal transplant.   Underwent colonoscopy outside facility  which show finding of ischemic colitis. No biopsy done.  -C diff, stool culture, ova and  parasite pending.  -GI consulted, Dr Amedeo Plenty.  -CT scan abdomen and pelvis ordered by GI.  -Appreciate Dr Amedeo Plenty evaluation.    2-Hypokalemia; replete with IV.   3-Hypothyroidism: Elevated TSH; check free T 3 and T 4.   4-HTN; Continue with metoprolol holder parameters. Hold Norvasc.  5-COPD; PRN nebulizer.   Code Status: Full Code.  Family Communication: care discussed with daughter.  Disposition Plan: Remain inpatient.    Consultants:  [facility]  Procedures:  none  Antibiotics:  none  HPI/Subjective: Relates more than 30 BM last week.  Had 4 BM yesterday.  She was advised to take imodium last week.  Every time she eats, she move her bowel.   Objective: Filed Vitals:   08/21/14 0535  BP: 103/40  Pulse: 88  Temp:   Resp:     Intake/Output Summary (Last 24 hours) at 08/21/14 0915 Last data filed at 08/21/14 5009  Gross per 24 hour  Intake 760.83 ml  Output    500 ml  Net 260.83 ml   Filed Weights   08/20/14 2350 08/21/14 0533  Weight: 86.093 kg (189 lb 12.8 oz) 83.9 kg (184 lb 15.5 oz)    Exam:   General:  Alert in no distress.   Cardiovascular: S 1, S 2 RRR  Respiratory: cta  Abdomen: bs present, soft, obese, distended, mild tenderness.   Musculoskeletal: no edema.   Data Reviewed: Basic Metabolic Panel:  Recent Labs Lab 08/20/14 1603 08/21/14 0433  NA 135 138  K 3.7 3.4*  CL 100 106  CO2 25 20  GLUCOSE 107* 82  BUN 8 5*  CREATININE 0.77 0.62  CALCIUM 8.7 8.2*    Liver Function Tests:  Recent Labs Lab 08/20/14 1603  AST 21  ALT 13  ALKPHOS 123*  BILITOT 0.5  PROT 6.0  ALBUMIN 2.8*    Recent Labs Lab 08/20/14 1603  LIPASE 18   No results for input(s): AMMONIA in the last 168 hours. CBC:  Recent Labs Lab 08/20/14 1603 08/21/14 0433  WBC 10.2 6.8  NEUTROABS 7.4  --   HGB 12.1 11.1*  HCT 36.9 34.0*  MCV 90.0 90.2  PLT 423* 375   Cardiac Enzymes: No results for input(s): CKTOTAL, CKMB, CKMBINDEX, TROPONINI in the last 168 hours. BNP (last 3 results) No results for input(s): PROBNP in the last 8760 hours. CBG:  Recent Labs Lab 08/21/14 0831  GLUCAP 87    No results found for this or any previous visit (from the past 240 hour(s)).   Studies: No results found.  Scheduled Meds: . acidophilus  1 capsule Oral Daily  . atorvastatin  10 mg Oral QPM  . heparin  5,000 Units Subcutaneous 3 times per day  . levothyroxine  88 mcg Oral QAC breakfast  . metoprolol  50 mg Oral Daily  . nicotine  21 mg Transdermal Daily  . pantoprazole  40 mg Oral Q1200  . potassium chloride  10 mEq Intravenous Q1 Hr x 4  . venlafaxine XR  150 mg Oral Q breakfast  Continuous Infusions: . sodium chloride 125 mL/hr at 08/21/14 9470    Principal Problem:   Diarrhea Active Problems:   HTN (hypertension)   HLD (hyperlipidemia)   Hypothyroidism   Tobacco abuse   COPD (chronic obstructive pulmonary disease)   Back pain    Time spent: 35 minutes.     Niel Hummer A  [facility] Pager [phone]. If 7PM-7AM, please contact night-coverage at [email or web address], password [email or web address] 08/21/2014, 9:15 AM  LOS: 1 day

## 2014-08-21 NOTE — Consult Note (Signed)
[facility] Consult Note  Referring Provider: No ref. provider found Primary Care Physician:  Lavera Guise, MD Primary Gastroenterologist:  Dr.  Laurel Dimmer Complaint: Diarrhea HPI: [name] Douglas is an 74 y.o. white female  who presents with persistent watery diarrhea. She has a complicated clinical course. On October 31 she had acute onset of lower abdominal pain and significant bloody diarrhea. She was admitted to the hospital for 4 days and treated with Cipro and Flagyl presumably for ischemic colitis. She also is discharged on a prednisone taper. Although her bleeding and pain improved her diarrhea never really did. She was apparently kept on Cipro and Flagyl for a significant period of time at least until November 23. On December 7 she saw her gastroenterologist and had a positive C. difficile toxin and was started on vancomycin 250 mg 4 times a day and florastar. After 7 days she was doing absolutely no better. She was admitted to the hospital and colonoscopy was performed on the 18th by Dr. Vira Agar with fecal transplant. The prep was poor in some of the pictures show what appears to be large ulcerated inflamed areas without pseudomembranes but more typical in appearance of inflammatory bowel disease or ischemic colitis. No biopsies were taken apparently and from what I can tell there was no follow-up C. difficile toxin done. There is a CT scan from December 14 which shows worsening of transverse, splenic flexure and descending colitis from a study done a week or so before. After the fecal transplant patient was told to stop all other medicines except for acidophilus but she has continued to have runny diarrhea up to 10 or more bowel movements a day and is lost 20 pounds and is barely been able to get out of bed. Her daughter presented her here for further workup and evaluation of her ongoing colitis. The patient denied any prior travel or antibiotics. She has no family history of  inflammatory bowel disease.  Past Medical History  Diagnosis Date  . COPD (chronic obstructive pulmonary disease)   . Complication of anesthesia     "agitated & restless after knee replacement in 2008"  . Hypertension   . High cholesterol   . On home oxygen therapy     "2L at night" (08/20/2014)  . Hypothyroidism   . Arthritis     "shoulders" (08/20/2014)  . Chronic lower back pain   . DDD (degenerative disc disease), lumbar   . Depression     Past Surgical History  Procedure Laterality Date  . Tonsillectomy  ~ 1950  . Excisional hemorrhoidectomy  1960's  . Total knee arthroplasty Left 2008  . Joint replacement    . Knee arthroscopy Left X 2 <2008  . Vaginal hysterectomy  1974  . Tubal ligation  1973  . Cardiac catheterization  ~ 2007  . Shoulder open rotator cuff repair Right 2001    Medications Prior to Admission  Medication Sig Dispense Refill  . amLODipine (NORVASC) 10 MG tablet Take 10 mg by mouth daily.    Marland Kitchen atorvastatin (LIPITOR) 10 MG tablet Take 10 mg by mouth every evening.    . Lactobacillus (ACIDOPHILUS PO) Take by mouth.    . levothyroxine (SYNTHROID, LEVOTHROID) 88 MCG tablet Take 88 mcg by mouth daily before breakfast.    . metoprolol (LOPRESSOR) 50 MG tablet Take 50 mg by mouth daily.    Marland Kitchen venlafaxine XR (EFFEXOR-XR) 150 MG 24 hr capsule Take 150 mg by mouth daily with breakfast. Also take a 64m  tablet per daughter    . venlafaxine XR (EFFEXOR-XR) 75 MG 24 hr capsule Take 75 mg by mouth daily with breakfast. Also takes a 125m tablet per daughter      Allergies: No Known Allergies  Family History  Problem Relation Age of Onset  . Diabetes Mother   . Hypertension Mother   . Lung cancer Mother   . Congestive Heart Failure Father   . Congestive Heart Failure Sister     Social History:  reports that she has been smoking.  She has never used smokeless tobacco. She reports that she does not drink alcohol or use illicit drugs.  Review of Systems: negative  except as above   Blood pressure 103/40, pulse 88, temperature 98.2 F (36.8 C), temperature source Oral, resp. rate 18, height 5' 4"  (1.626 m), weight 83.9 kg (184 lb 15.5 oz), SpO2 91 %. Head: Normocephalic, without obvious abnormality, atraumatic Neck: no adenopathy, no carotid bruit, no JVD, supple, symmetrical, trachea midline and thyroid not enlarged, symmetric, no tenderness/mass/nodules Resp: clear to auscultation bilaterally Cardio: regular rate and rhythm, S1, S2 normal, no murmur, click, rub or gallop GI: Abdomen soft slightly distended with normoactive bowel sounds. but megaly masses or guarding Extremities: extremities normal, atraumatic, no cyanosis or edema  Results for orders placed or performed during the hospital encounter of 08/20/14 (from the past 48 hour(s))  CBC with Differential     Status: Abnormal   Collection Time: 08/20/14  4:03 PM  Result Value Ref Range   WBC 10.2 4.0 - 10.5 K/uL   RBC 4.10 3.87 - 5.11 MIL/uL   Hemoglobin 12.1 12.0 - 15.0 g/dL   HCT 36.9 36.0 - 46.0 %   MCV 90.0 78.0 - 100.0 fL   MCH 29.5 26.0 - 34.0 pg   MCHC 32.8 30.0 - 36.0 g/dL   RDW 13.9 11.5 - 15.5 %   Platelets 423 (H) 150 - 400 K/uL   Neutrophils Relative % 72 43 - 77 %   Neutro Abs 7.4 1.7 - 7.7 K/uL   Lymphocytes Relative 17 12 - 46 %   Lymphs Abs 1.7 0.7 - 4.0 K/uL   Monocytes Relative 10 3 - 12 %   Monocytes Absolute 1.0 0.1 - 1.0 K/uL   Eosinophils Relative 1 0 - 5 %   Eosinophils Absolute 0.1 0.0 - 0.7 K/uL   Basophils Relative 0 0 - 1 %   Basophils Absolute 0.0 0.0 - 0.1 K/uL  Comprehensive metabolic panel     Status: Abnormal   Collection Time: 08/20/14  4:03 PM  Result Value Ref Range   Sodium 135 135 - 145 mmol/L    Comment: Please note change in reference range.   Potassium 3.7 3.5 - 5.1 mmol/L    Comment: Please note change in reference range.   Chloride 100 96 - 112 mEq/L   CO2 25 19 - 32 mmol/L   Glucose, Bld 107 (H) 70 - 99 mg/dL   BUN 8 6 - 23 mg/dL    Creatinine, Ser 0.77 0.50 - 1.10 mg/dL   Calcium 8.7 8.4 - 10.5 mg/dL   Total Protein 6.0 6.0 - 8.3 g/dL   Albumin 2.8 (L) 3.5 - 5.2 g/dL   AST 21 0 - 37 U/L   ALT 13 0 - 35 U/L   Alkaline Phosphatase 123 (H) 39 - 117 U/L   Total Bilirubin 0.5 0.3 - 1.2 mg/dL   GFR calc non Af Amer 81 (L) >90 mL/min   GFR  calc Af Amer >90 >90 mL/min    Comment: (NOTE) The eGFR has been calculated using the CKD EPI equation. This calculation has not been validated in all clinical situations. eGFR's persistently <90 mL/min signify possible Chronic Kidney Disease.    Anion gap 10 5 - 15  Lipase, blood     Status: None   Collection Time: 08/20/14  4:03 PM  Result Value Ref Range   Lipase 18 11 - 59 U/L  Urinalysis, Routine w reflex microscopic     Status: Abnormal   Collection Time: 08/20/14  9:25 PM  Result Value Ref Range   Color, Urine AMBER (A) YELLOW    Comment: BIOCHEMICALS MAY BE AFFECTED BY COLOR   APPearance HAZY (A) CLEAR   Specific Gravity, Urine 1.014 1.005 - 1.030   pH 6.0 5.0 - 8.0   Glucose, UA NEGATIVE NEGATIVE mg/dL   Hgb urine dipstick NEGATIVE NEGATIVE   Bilirubin Urine SMALL (A) NEGATIVE   Ketones, ur 15 (A) NEGATIVE mg/dL   Protein, ur NEGATIVE NEGATIVE mg/dL   Urobilinogen, UA 1.0 0.0 - 1.0 mg/dL   Nitrite NEGATIVE NEGATIVE   Leukocytes, UA NEGATIVE NEGATIVE    Comment: MICROSCOPIC NOT DONE ON URINES WITH NEGATIVE PROTEIN, BLOOD, LEUKOCYTES, NITRITE, OR GLUCOSE <1000 mg/dL.  Protime-INR     Status: None   Collection Time: 08/20/14 11:59 PM  Result Value Ref Range   Prothrombin Time 13.3 11.6 - 15.2 seconds   INR 1.00 0.00 - 1.49  TSH     Status: Abnormal   Collection Time: 08/20/14 11:59 PM  Result Value Ref Range   TSH 8.650 (H) 0.350 - 4.500 uIU/mL  Basic metabolic panel     Status: Abnormal   Collection Time: 08/21/14  4:33 AM  Result Value Ref Range   Sodium 138 135 - 145 mmol/L    Comment: Please note change in reference range.   Potassium 3.4 (L) 3.5 - 5.1  mmol/L    Comment: Please note change in reference range.   Chloride 106 96 - 112 mEq/L   CO2 20 19 - 32 mmol/L   Glucose, Bld 82 70 - 99 mg/dL   BUN 5 (L) 6 - 23 mg/dL   Creatinine, Ser 0.62 0.50 - 1.10 mg/dL   Calcium 8.2 (L) 8.4 - 10.5 mg/dL   GFR calc non Af Amer 87 (L) >90 mL/min   GFR calc Af Amer >90 >90 mL/min    Comment: (NOTE) The eGFR has been calculated using the CKD EPI equation. This calculation has not been validated in all clinical situations. eGFR's persistently <90 mL/min signify possible Chronic Kidney Disease.    Anion gap 12 5 - 15  CBC     Status: Abnormal   Collection Time: 08/21/14  4:33 AM  Result Value Ref Range   WBC 6.8 4.0 - 10.5 K/uL   RBC 3.77 (L) 3.87 - 5.11 MIL/uL   Hemoglobin 11.1 (L) 12.0 - 15.0 g/dL   HCT 34.0 (L) 36.0 - 46.0 %   MCV 90.2 78.0 - 100.0 fL   MCH 29.4 26.0 - 34.0 pg   MCHC 32.6 30.0 - 36.0 g/dL   RDW 14.0 11.5 - 15.5 %   Platelets 375 150 - 400 K/uL  Glucose, capillary     Status: None   Collection Time: 08/21/14  8:31 AM  Result Value Ref Range   Glucose-Capillary 87 70 - 99 mg/dL   No results found.  Assessment: Ongoing diarrhea of 2 months duration with initial  presentation sounded most suspicious for ischemic colitis versus an infectious colitis or inflammatory bowel disease. A little over 1 month into course of diarrhea she had her only C. difficile toxin which was positive subsequent leak treated with vancomycin with no improvement followed by colonoscopy with fecal transplantation and no subsequent improvement. Plan:  Await C. difficile toxin and other GI pathogens ordered We'll repeat CT scan of the abdomen and pelvis to ascertain the extent and severity of current inflammation to help guide therapy. If C. difficile is positive will consider ID consultation and also consider the use of fidaxomycin versus resumption of vancomycin. If C. difficile is negative I would favor colonoscopy with full biopsies to rule out  inflammatory bowel disease and ischemia. We'll follow with you.  YQMGN,OIBB C 08/21/2014, 12:18 PM

## 2014-08-21 NOTE — Progress Notes (Signed)
OT Cancellation Note  Patient Details Name: [name] Douglas MRN: [id number] DOB: [birth year]   Cancelled Treatment:    Reason Eval/Treat Not Completed: Patient at procedure or test/ unavailable  Benito Mccreedy OTR/L [phone] 08/21/2014, 5:00 PM

## 2014-08-22 DIAGNOSIS — K559 Vascular disorder of intestine, unspecified: Secondary | ICD-10-CM

## 2014-08-22 DIAGNOSIS — A047 Enterocolitis due to Clostridium difficile: Principal | ICD-10-CM

## 2014-08-22 DIAGNOSIS — A0472 Enterocolitis due to Clostridium difficile, not specified as recurrent: Secondary | ICD-10-CM

## 2014-08-22 LAB — GLUCOSE, CAPILLARY
Glucose-Capillary: 82 mg/dL (ref 70–99)
Glucose-Capillary: 80 mg/dL (ref 70–99)

## 2014-08-22 LAB — BASIC METABOLIC PANEL
Anion gap: 10 (ref 5–15)
BUN: 6 mg/dL (ref 6–23)
CO2: 22 mmol/L (ref 19–32)
Calcium: 8 mg/dL — ABNORMAL LOW (ref 8.4–10.5)
Chloride: 102 mEq/L (ref 96–112)
Creatinine, Ser: 0.62 mg/dL (ref 0.50–1.10)
GFR calc Af Amer: >90 mL/min (ref >90)
GFR calc non Af Amer: 87 mL/min — ABNORMAL LOW (ref >90)
Glucose, Bld: 54 mg/dL — ABNORMAL LOW (ref 70–99)
Potassium: 3.6 mmol/L (ref 3.5–5.1)
Sodium: 134 mmol/L — ABNORMAL LOW (ref 135–145)

## 2014-08-22 LAB — CBC
HCT: 32.7 % — ABNORMAL LOW (ref 36.0–46.0)
Hemoglobin: 10.7 g/dL — ABNORMAL LOW (ref 12.0–15.0)
MCH: 29.6 pg (ref 26.0–34.0)
MCHC: 32.7 g/dL (ref 30.0–36.0)
MCV: 90.3 fL (ref 78.0–100.0)
Platelets: 357 10*3/uL (ref 150–400)
RBC: 3.62 MIL/uL — ABNORMAL LOW (ref 3.87–5.11)
RDW: 13.7 % (ref 11.5–15.5)
WBC: 5.9 10*3/uL (ref 4.0–10.5)

## 2014-08-22 LAB — T4, FREE: Free T4: 1.13 ng/dL (ref 0.80–1.80)

## 2014-08-22 LAB — T3, FREE: T3, Free: 2.3 pg/mL (ref 2.3–4.2)

## 2014-08-22 MED ORDER — SODIUM CHLORIDE 0.9 % IR SOLN
500.0000 mg | Freq: Once | Status: AC
  Administered 2014-08-22: 500 mg via RECTAL
  Filled 2014-08-22: qty 500

## 2014-08-22 MED ORDER — VANCOMYCIN 50 MG/ML ORAL SOLUTION
125.0000 mg | Freq: Four times a day (QID) | ORAL | Status: DC
  Administered 2014-08-22 – 2014-08-23 (×5): 125 mg via ORAL
  Filled 2014-08-22 (×7): qty 2.5

## 2014-08-22 MED ORDER — RIFAXIMIN 200 MG PO TABS: 400.0000 mg | ORAL_TABLET | Freq: Three times a day (TID) | ORAL | Status: DC

## 2014-08-22 MED ORDER — POTASSIUM CHLORIDE 10 MEQ/100ML IV SOLN
10.0000 meq | INTRAVENOUS | Status: AC
  Administered 2014-08-22 (×3): 10 meq via INTRAVENOUS
  Filled 2014-08-22 (×3): qty 100

## 2014-08-22 NOTE — Progress Notes (Signed)
PT Cancellation Note  Patient Details Name: MERLINDA WRUBEL MRN: [id number] DOB: [birth year]   Cancelled Treatment:    Reason Eval/Treat Not Completed: Medical issues which prohibited therapy  Patient declining therapy due to nausea.  Will return tomorrow.  Despina Pole 08/22/2014, 2:35 PM Carita Pian. Sanjuana Kava, [place] Pager [phone]

## 2014-08-22 NOTE — Progress Notes (Signed)
OT Cancellation Note  Patient Details Name: [name] Douglas MRN: [id number] DOB: Jul 15, 1941   Cancelled Treatment:    Reason Eval/Treat Not Completed: Other (comment). Pt politely declined due to being nauseous.  Benito Mccreedy OTR/L [phone] 08/22/2014, 1:39 PM

## 2014-08-22 NOTE — Progress Notes (Signed)
[facility] Progress Note  Subjective: Still has diarrhea nonbloody without pain  Objective: Vital signs in last 24 hours: Temp:  [98.6 F (37 C)-99.1 F (37.3 C)] 98.8 F (37.1 C) (01/03 0609) Pulse Rate:  [81] 81 (01/02 2148) Resp:  [18-24] 18 (01/03 0609) BP: (97-105)/(31-42) 105/31 mmHg (01/03 0609) SpO2:  [87 %-94 %] 94 % (01/02 2233) Weight:  [83.8 kg (184 lb 11.9 oz)] 83.8 kg (184 lb 11.9 oz) (01/03 0609) Weight change: -2.293 kg (-5 lb 0.9 oz)   PE: Unchanged  Lab Results: Results for orders placed or performed during the hospital encounter of 08/20/14 (from the past 24 hour(s))  CBC     Status: Abnormal   Collection Time: 08/22/14  4:22 AM  Result Value Ref Range   WBC 5.9 4.0 - 10.5 K/uL   RBC 3.62 (L) 3.87 - 5.11 MIL/uL   Hemoglobin 10.7 (L) 12.0 - 15.0 g/dL   HCT 32.7 (L) 36.0 - 46.0 %   MCV 90.3 78.0 - 100.0 fL   MCH 29.6 26.0 - 34.0 pg   MCHC 32.7 30.0 - 36.0 g/dL   RDW 13.7 11.5 - 15.5 %   Platelets 357 150 - 400 K/uL  Basic metabolic panel     Status: Abnormal   Collection Time: 08/22/14  4:22 AM  Result Value Ref Range   Sodium 134 (L) 135 - 145 mmol/L   Potassium 3.6 3.5 - 5.1 mmol/L   Chloride 102 96 - 112 mEq/L   CO2 22 19 - 32 mmol/L   Glucose, Bld 54 (L) 70 - 99 mg/dL   BUN 6 6 - 23 mg/dL   Creatinine, Ser 0.62 0.50 - 1.10 mg/dL   Calcium 8.0 (L) 8.4 - 10.5 mg/dL   GFR calc non Af Amer 87 (L) >90 mL/min   GFR calc Af Amer >90 >90 mL/min   Anion gap 10 5 - 15  Glucose, capillary     Status: None   Collection Time: 08/22/14  7:01 AM  Result Value Ref Range   Glucose-Capillary 82 70 - 99 mg/dL  Glucose, capillary     Status: None   Collection Time: 08/22/14  8:14 AM  Result Value Ref Range   Glucose-Capillary 80 70 - 99 mg/dL    Studies/Results: Ct Abdomen Pelvis W Contrast  08/21/2014   CLINICAL DATA:  Initial encounter for 2 months with intermittent nausea and vomiting.  EXAM: CT ABDOMEN AND PELVIS WITH CONTRAST  TECHNIQUE:  Multidetector CT imaging of the abdomen and pelvis was performed using the standard protocol following bolus administration of intravenous contrast.  CONTRAST:  11mL OMNIPAQUE IOHEXOL 300 MG/ML  SOLN  COMPARISON:  The patient reportedly had a prior study at [facility], but that is not available on the timeline.  FINDINGS: Lower chest: Nonspecific interlobular septal thickening is seen in the lung bases with some compressive atelectasis.  Hepatobiliary: No focal abnormality within the liver parenchyma. Probable tiny stones in the lumen of the gallbladder. No intrahepatic or extrahepatic biliary dilation.  Pancreas: No focal mass lesion. No dilatation of the main duct. No intraparenchymal cyst. No peripancreatic edema.  Spleen: No splenomegaly. No focal mass lesion.  Adrenals/Urinary Tract: No adrenal nodule or mass. Kidneys are unremarkable. No evidence for hydroureter urinary bladder has normal features.  Stomach/Bowel: Peristalsis noted in the distal stomach. Duodenum is normally positioned as is the ligament of Treitz. No small bowel wall thickening. No small bowel dilatation. Terminal ileum is normal. The appendix is unremarkable. Mild  wall thickening a is identified in the cecum and ascending colon with relative sparing of the proximal transverse colon. More prominent circumferential wall thickening edema is seen in the distal transverse colon, splenic flexure, and descending colon. The distal sigmoid colon and rectum appear preserved.  Vascular/Lymphatic: Atherosclerotic calcification is noted in the wall of the abdominal aorta without aneurysm. The celiac axis and superior mesenteric artery opacify proximally. The inferior mesenteric artery opacifies. The portal vein is patent as is the superior mesenteric vein.  No lymphadenopathy in the abdomen. No pelvic sidewall lymphadenopathy.  Reproductive: Uterus is surgically absent.  No adnexal mass.  Other: No intraperitoneal free fluid.   Musculoskeletal: Bone windows reveal no worrisome lytic or sclerotic osseous lesions. Superior endplate compression deformity at T12 and L1 is age-indeterminate.  IMPRESSION: 1. Mild circumferential wall thickening in the cecum and ascending colon more advanced edema and wall thickening in the splenic flexure and descending colon. Intervening segment of colon appear to be relatively well preserved. Imaging features could be compatible with mid back disorder laboratory colitis. Ischemia is considered less likely. 2. Question cholelithiasis apparent   Electronically Signed   By: Misty Stanley M.D.   On: 08/21/2014 17:37      Assessment: Recurrent C. difficile colitis unclear whether superimposed or concurrent with ischemic and/or inflammatory bowel disease  Plan: Appreciate IDs assistance and will continue vancomycin by mouth and by enema as well as xifaxin and acidophilus. If does not improve we'll consider repeat colonoscopy if felt to help management [name] Douglas C 08/22/2014, 1:04 PM

## 2014-08-22 NOTE — Consult Note (Signed)
[facility]  Date of Admission:  08/20/2014  Date of Consult:  08/22/2014  Reason for Consult: Recurrent C diff Referring Physician: Tyrell Antonio  Impression/Recommendation Recurrent C diff Ischemic Colitis  Would Restart po vanco, plan for 6 week taper vanco enema Change difcid to rifaxamin for 2 weeks.  Stop PPI I would not consider stool transplant on her until she has improved significantly  Comment Glad to see her in ID clinic for f/u, please have her see Dr Baxter Flattery who has an interest in stool txp  Thank you so much for this interesting consult,   Bobby Rumpf (pager) [phone] [email or web address]  [name] Douglas is an 74 y.o. female.  HPI: 74 yo F previously admitted to [facility] 06-19-14 to 06-23-14 with ileocolitis. She was treated with prednisone, cipro/flagyl (until ~ 07-12-14), and returned 12-7 with C diff.  She was treated with po vancomycin however returned on 12-18 with persistent diarrhea. She was found on colonoscopy to have ischemic colitis as well as persistent C diff. Her po vanco was stopped and she underwent stool transplant.  She returns to [facility] on 1-2 with with diarrhea. She was off anbx at that time. Afebrile on adm, WBC normal. She was started on fidoxamincin. Her C diff is again +.  She underwent CT scan on 1-3 showing:1. Mild circumferential wall thickening in the cecum and ascending colon more advanced edema and wall thickening in the splenic flexure and descending colon. Intervening segment of colon appear to be relatively well preserved. Imaging features could be compatible with mid back disorder laboratory colitis. Ischemia is considered less likely. 2. Question cholelithiasis apparent  She has remained afebrile in the hospital.   Past Medical History  Diagnosis Date  . COPD (chronic obstructive pulmonary disease)   . Complication of anesthesia     "agitated & restless after knee replacement in 2008"  .  Hypertension   . High cholesterol   . On home oxygen therapy     "2L at night" (08/20/2014)  . Hypothyroidism   . Arthritis     "shoulders" (08/20/2014)  . Chronic lower back pain   . DDD (degenerative disc disease), lumbar   . Depression     Past Surgical History  Procedure Laterality Date  . Tonsillectomy  ~ 1950  . Excisional hemorrhoidectomy  1960's  . Total knee arthroplasty Left 2008  . Joint replacement    . Knee arthroscopy Left X 2 <2008  . Vaginal hysterectomy  1974  . Tubal ligation  1973  . Cardiac catheterization  ~ 2007  . Shoulder open rotator cuff repair Right 2001     No Known Allergies  Medications:  Scheduled: . acidophilus  1 capsule Oral Daily  . atorvastatin  10 mg Oral QPM  . heparin  5,000 Units Subcutaneous 3 times per day  . levothyroxine  88 mcg Oral QAC breakfast  . metoprolol  50 mg Oral Daily  . nicotine  21 mg Transdermal Daily  . pantoprazole  40 mg Oral Q1200  . potassium chloride  10 mEq Intravenous Q1 Hr x 3  . venlafaxine XR  150 mg Oral Q breakfast    Abtx:  Anti-infectives    None      Total days of antibiotics 4 (difcid)          Social History:  reports that she has been smoking.  She has never used smokeless tobacco. She reports that she does not drink alcohol or use illicit drugs.  Family History  Problem Relation Age of Onset  . Diabetes Mother   . Hypertension Mother   . Lung cancer Mother   . Congestive Heart Failure Father   . Congestive Heart Failure Sister     General ROS: no cough or SOB, no dysuria, 22# wt loss, anorexia+, see HPI.   Blood pressure 105/31, pulse 81, temperature 98.8 F (37.1 C), temperature source Oral, resp. rate 18, height _0  (1.626 m), weight 83.8 kg (184 lb 11.9 oz), SpO2 94 %. General appearance: alert, cooperative, fatigued, no distress and pale Eyes: negative findings: pupils equal, round, reactive to light and accomodation Throat: normal findings: oropharynx pink & moist without  lesions or evidence of thrush and abnormal findings: dentition: none Neck: no adenopathy and supple, symmetrical, trachea midline Lungs: clear to auscultation bilaterally Heart: regular rate and rhythm Abdomen: normal findings: bowel sounds normal, soft, non-tender and very mild tenderness, no r/g.  Extremities: edema trace   Results for orders placed or performed during the hospital encounter of 08/20/14 (from the past 48 hour(s))  CBC with Differential     Status: Abnormal   Collection Time: 08/20/14  4:03 PM  Result Value Ref Range   WBC 10.2 4.0 - 10.5 K/uL   RBC 4.10 3.87 - 5.11 MIL/uL   Hemoglobin 12.1 12.0 - 15.0 g/dL   HCT 36.9 36.0 - 46.0 %   MCV 90.0 78.0 - 100.0 fL   MCH 29.5 26.0 - 34.0 pg   MCHC 32.8 30.0 - 36.0 g/dL   RDW 13.9 11.5 - 15.5 %   Platelets 423 (H) 150 - 400 K/uL   Neutrophils Relative % 72 43 - 77 %   Neutro Abs 7.4 1.7 - 7.7 K/uL   Lymphocytes Relative 17 12 - 46 %   Lymphs Abs 1.7 0.7 - 4.0 K/uL   Monocytes Relative 10 3 - 12 %   Monocytes Absolute 1.0 0.1 - 1.0 K/uL   Eosinophils Relative 1 0 - 5 %   Eosinophils Absolute 0.1 0.0 - 0.7 K/uL   Basophils Relative 0 0 - 1 %   Basophils Absolute 0.0 0.0 - 0.1 K/uL  Comprehensive metabolic panel     Status: Abnormal   Collection Time: 08/20/14  4:03 PM  Result Value Ref Range   Sodium 135 135 - 145 mmol/L    Comment: Please note change in reference range.   Potassium 3.7 3.5 - 5.1 mmol/L    Comment: Please note change in reference range.   Chloride 100 96 - 112 mEq/L   CO2 25 19 - 32 mmol/L   Glucose, Bld 107 (H) 70 - 99 mg/dL   BUN 8 6 - 23 mg/dL   Creatinine, Ser 0.77 0.50 - 1.10 mg/dL   Calcium 8.7 8.4 - 10.5 mg/dL   Total Protein 6.0 6.0 - 8.3 g/dL   Albumin 2.8 (L) 3.5 - 5.2 g/dL   AST 21 0 - 37 U/L   ALT 13 0 - 35 U/L   Alkaline Phosphatase 123 (H) 39 - 117 U/L   Total Bilirubin 0.5 0.3 - 1.2 mg/dL   GFR calc non Af Amer 81 (L) >90 mL/min   GFR calc Af Amer >90 >90 mL/min    Comment:  (NOTE) The eGFR has been calculated using the CKD EPI equation. This calculation has not been validated in all clinical situations. eGFR's persistently <90 mL/min signify possible Chronic Kidney Disease.    Anion gap 10 5 - 15  Lipase, blood  Status: None   Collection Time: 08/20/14  4:03 PM  Result Value Ref Range   Lipase 18 11 - 59 U/L  Urinalysis, Routine w reflex microscopic     Status: Abnormal   Collection Time: 08/20/14  9:25 PM  Result Value Ref Range   Color, Urine AMBER (A) YELLOW    Comment: BIOCHEMICALS MAY BE AFFECTED BY COLOR   APPearance HAZY (A) CLEAR   Specific Gravity, Urine 1.014 1.005 - 1.030   pH 6.0 5.0 - 8.0   Glucose, UA NEGATIVE NEGATIVE mg/dL   Hgb urine dipstick NEGATIVE NEGATIVE   Bilirubin Urine SMALL (A) NEGATIVE   Ketones, ur 15 (A) NEGATIVE mg/dL   Protein, ur NEGATIVE NEGATIVE mg/dL   Urobilinogen, UA 1.0 0.0 - 1.0 mg/dL   Nitrite NEGATIVE NEGATIVE   Leukocytes, UA NEGATIVE NEGATIVE    Comment: MICROSCOPIC NOT DONE ON URINES WITH NEGATIVE PROTEIN, BLOOD, LEUKOCYTES, NITRITE, OR GLUCOSE <1000 mg/dL.  Protime-INR     Status: None   Collection Time: 08/20/14 11:59 PM  Result Value Ref Range   Prothrombin Time 13.3 11.6 - 15.2 seconds   INR 1.00 0.00 - 1.49  TSH     Status: Abnormal   Collection Time: 08/20/14 11:59 PM  Result Value Ref Range   TSH 8.650 (H) 0.350 - 4.500 uIU/mL  Basic metabolic panel     Status: Abnormal   Collection Time: 08/21/14  4:33 AM  Result Value Ref Range   Sodium 138 135 - 145 mmol/L    Comment: Please note change in reference range.   Potassium 3.4 (L) 3.5 - 5.1 mmol/L    Comment: Please note change in reference range.   Chloride 106 96 - 112 mEq/L   CO2 20 19 - 32 mmol/L   Glucose, Bld 82 70 - 99 mg/dL   BUN 5 (L) 6 - 23 mg/dL   Creatinine, Ser 0.62 0.50 - 1.10 mg/dL   Calcium 8.2 (L) 8.4 - 10.5 mg/dL   GFR calc non Af Amer 87 (L) >90 mL/min   GFR calc Af Amer >90 >90 mL/min    Comment: (NOTE) The eGFR  has been calculated using the CKD EPI equation. This calculation has not been validated in all clinical situations. eGFR's persistently <90 mL/min signify possible Chronic Kidney Disease.    Anion gap 12 5 - 15  CBC     Status: Abnormal   Collection Time: 08/21/14  4:33 AM  Result Value Ref Range   WBC 6.8 4.0 - 10.5 K/uL   RBC 3.77 (L) 3.87 - 5.11 MIL/uL   Hemoglobin 11.1 (L) 12.0 - 15.0 g/dL   HCT 34.0 (L) 36.0 - 46.0 %   MCV 90.2 78.0 - 100.0 fL   MCH 29.4 26.0 - 34.0 pg   MCHC 32.6 30.0 - 36.0 g/dL   RDW 14.0 11.5 - 15.5 %   Platelets 375 150 - 400 K/uL  Clostridium Difficile by PCR     Status: Abnormal   Collection Time: 08/21/14  8:22 AM  Result Value Ref Range   C difficile by pcr POSITIVE (A) NEGATIVE    Comment: CRITICAL RESULT CALLED TO, READ BACK BY AND VERIFIED WITH: ZHU,J RN 08/21/14 1843 WOOTEN,K   Glucose, capillary     Status: None   Collection Time: 08/21/14  8:31 AM  Result Value Ref Range   Glucose-Capillary 87 70 - 99 mg/dL  CBC     Status: Abnormal   Collection Time: 08/22/14  4:22 AM  Result  Value Ref Range   WBC 5.9 4.0 - 10.5 K/uL   RBC 3.62 (L) 3.87 - 5.11 MIL/uL   Hemoglobin 10.7 (L) 12.0 - 15.0 g/dL   HCT 32.7 (L) 36.0 - 46.0 %   MCV 90.3 78.0 - 100.0 fL   MCH 29.6 26.0 - 34.0 pg   MCHC 32.7 30.0 - 36.0 g/dL   RDW 13.7 11.5 - 15.5 %   Platelets 357 150 - 400 K/uL  Basic metabolic panel     Status: Abnormal   Collection Time: 08/22/14  4:22 AM  Result Value Ref Range   Sodium 134 (L) 135 - 145 mmol/L    Comment: Please note change in reference range.   Potassium 3.6 3.5 - 5.1 mmol/L    Comment: Please note change in reference range.   Chloride 102 96 - 112 mEq/L   CO2 22 19 - 32 mmol/L   Glucose, Bld 54 (L) 70 - 99 mg/dL   BUN 6 6 - 23 mg/dL   Creatinine, Ser 0.62 0.50 - 1.10 mg/dL   Calcium 8.0 (L) 8.4 - 10.5 mg/dL   GFR calc non Af Amer 87 (L) >90 mL/min   GFR calc Af Amer >90 >90 mL/min    Comment: (NOTE) The eGFR has been calculated  using the CKD EPI equation. This calculation has not been validated in all clinical situations. eGFR's persistently <90 mL/min signify possible Chronic Kidney Disease.    Anion gap 10 5 - 15  Glucose, capillary     Status: None   Collection Time: 08/22/14  7:01 AM  Result Value Ref Range   Glucose-Capillary 82 70 - 99 mg/dL  Glucose, capillary     Status: None   Collection Time: 08/22/14  8:14 AM  Result Value Ref Range   Glucose-Capillary 80 70 - 99 mg/dL   No results found for: SDES, SPECREQUEST, CULT, REPTSTATUS Ct Abdomen Pelvis W Contrast  08/21/2014   CLINICAL DATA:  Initial encounter for 2 months with intermittent nausea and vomiting.  EXAM: CT ABDOMEN AND PELVIS WITH CONTRAST  TECHNIQUE: Multidetector CT imaging of the abdomen and pelvis was performed using the standard protocol following bolus administration of intravenous contrast.  CONTRAST:  195m OMNIPAQUE IOHEXOL 300 MG/ML  SOLN  COMPARISON:  [facility] that is not available on the timeline.  FINDINGS: Lower chest: Nonspecific interlobular septal thickening is seen in the lung bases with some compressive atelectasis.  Hepatobiliary: No focal abnormality within the liver parenchyma. Probable tiny stones in the lumen of the gallbladder. No intrahepatic or extrahepatic biliary dilation.  Pancreas: No focal mass lesion. No dilatation of the main duct. No intraparenchymal cyst. No peripancreatic edema.  Spleen: No splenomegaly. No focal mass lesion.  Adrenals/Urinary Tract: No adrenal nodule or mass. Kidneys are unremarkable. No evidence for hydroureter urinary bladder has normal features.  Stomach/Bowel: Peristalsis noted in the distal stomach. Duodenum is normally positioned as is the ligament of Treitz. No small bowel wall thickening. No small bowel dilatation. Terminal ileum is normal. The appendix is unremarkable. Mild wall thickening a is identified in the cecum and  ascending colon with relative sparing of the proximal transverse colon. More prominent circumferential wall thickening edema is seen in the distal transverse colon, splenic flexure, and descending colon. The distal sigmoid colon and rectum appear preserved.  Vascular/Lymphatic: Atherosclerotic calcification is noted in the wall of the abdominal aorta without aneurysm. The celiac axis and superior mesenteric artery  opacify proximally. The inferior mesenteric artery opacifies. The portal vein is patent as is the superior mesenteric vein.  No lymphadenopathy in the abdomen. No pelvic sidewall lymphadenopathy.  Reproductive: Uterus is surgically absent.  No adnexal mass.  Other: No intraperitoneal free fluid.  Musculoskeletal: Bone windows reveal no worrisome lytic or sclerotic osseous lesions. Superior endplate compression deformity at T12 and L1 is age-indeterminate.  IMPRESSION: 1. Mild circumferential wall thickening in the cecum and ascending colon more advanced edema and wall thickening in the splenic flexure and descending colon. Intervening segment of colon appear to be relatively well preserved. Imaging features could be compatible with mid back disorder laboratory colitis. Ischemia is considered less likely. 2. Question cholelithiasis apparent   Electronically Signed   By: Misty Stanley M.D.   On: 08/21/2014 17:37   Recent Results (from the past 240 hour(s))  Clostridium Difficile by PCR     Status: Abnormal   Collection Time: 08/21/14  8:22 AM  Result Value Ref Range Status   C difficile by pcr POSITIVE (A) NEGATIVE Final    Comment: CRITICAL RESULT CALLED TO, READ BACK BY AND VERIFIED WITH: ZHU,J RN 08/21/14 1843 WOOTEN,K       08/22/2014, 12:17 PM     LOS: 2 days

## 2014-08-22 NOTE — Progress Notes (Signed)
[facility] PROGRESS NOTE  [name] Douglas:[id number] DOB: 1941/01/14 DOA: 08/20/2014 PCP: Lavera Guise, MD  Assessment/Plan: 1-Recurrent, persistent C diff.  -On October she was treated with cipro and flagyl and prednisone taper for colitis..On 12-07 diagnosed with C diff . Treated with florasta, vancomycin for 7 days , and fecal transplant.   -Underwent colonoscopy outside facility  which show finding of ischemic colitis. No biopsy done.  -C diff positive this admission, stool culture, ova and  parasite pending.  -GI consulted, Dr Amedeo Plenty. Appreciate evaluation. -CT scan abdomen: mild wall thickening in the cecum and ascending colon.  -ID consulted for treatments recommendation. Appreciate Dr Johnnye Sima help.   2-Hypokalemia; replete with IV.   3-Hypothyroidism: Elevated TSH; check free T 3 and T 4 pending.    4-HTN; Continue with metoprolol holder parameters. Hold Norvasc.   5-COPD; PRN nebulizer.   Code Status: Full Code.  Family Communication: care discussed with daughter.  Disposition Plan: Remain inpatient.    Consultants:  [facility]  Procedures:  none  Antibiotics:  none  HPI/Subjective: She has had 2 watery BM since admission. Still with nausea.  no significant abdominal pain.   Objective: Filed Vitals:   08/22/14 0609  BP: 105/31  Pulse:   Temp: 98.8 F (37.1 C)  Resp: 18    Intake/Output Summary (Last 24 hours) at 08/22/14 1129 Last data filed at 08/22/14 1037  Gross per 24 hour  Intake 1636.25 ml  Output   1000 ml  Net 636.25 ml   Filed Weights   08/20/14 2350 08/21/14 0533 08/22/14 0609  Weight: 86.093 kg (189 lb 12.8 oz) 83.9 kg (184 lb 15.5 oz) 83.8 kg (184 lb 11.9 oz)    Exam:   General:  Alert in no distress.   Cardiovascular: S 1, S 2 RRR  Respiratory: cta  Abdomen: bs present, soft, obese, distended, mild tenderness.   Musculoskeletal: no edema.   Data Reviewed: Basic Metabolic Panel:  Recent Labs Lab  08/20/14 1603 08/21/14 0433 08/22/14 0422  NA 135 138 134*  K 3.7 3.4* 3.6  CL 100 106 102  CO2 25 20 22   GLUCOSE 107* 82 54*  BUN 8 5* 6  CREATININE 0.77 0.62 0.62  CALCIUM 8.7 8.2* 8.0*   Liver Function Tests:  Recent Labs Lab 08/20/14 1603  AST 21  ALT 13  ALKPHOS 123*  BILITOT 0.5  PROT 6.0  ALBUMIN 2.8*    Recent Labs Lab 08/20/14 1603  LIPASE 18   No results for input(s): AMMONIA in the last 168 hours. CBC:  Recent Labs Lab 08/20/14 1603 08/21/14 0433 08/22/14 0422  WBC 10.2 6.8 5.9  NEUTROABS 7.4  --   --   HGB 12.1 11.1* 10.7*  HCT 36.9 34.0* 32.7*  MCV 90.0 90.2 90.3  PLT 423* 375 357   Cardiac Enzymes: No results for input(s): CKTOTAL, CKMB, CKMBINDEX, TROPONINI in the last 168 hours. BNP (last 3 results) No results for input(s): PROBNP in the last 8760 hours. CBG:  Recent Labs Lab 08/21/14 0831 08/22/14 0701 08/22/14 0814  GLUCAP 87 82 80    Recent Results (from the past 240 hour(s))  Clostridium Difficile by PCR     Status: Abnormal   Collection Time: 08/21/14  8:22 AM  Result Value Ref Range Status   C difficile by pcr POSITIVE (A) NEGATIVE Final    Comment: CRITICAL RESULT CALLED TO, READ BACK BY AND VERIFIED WITH: ZHU,J RN 08/21/14 [place]  Studies: Ct Abdomen Pelvis W Contrast  08/21/2014   CLINICAL DATA:  Initial encounter for 2 months with intermittent nausea and vomiting.  EXAM: CT ABDOMEN AND PELVIS WITH CONTRAST  TECHNIQUE: Multidetector CT imaging of the abdomen and pelvis was performed using the standard protocol following bolus administration of intravenous contrast.  CONTRAST:  159mL OMNIPAQUE IOHEXOL 300 MG/ML  SOLN  COMPARISON:  The patient reportedly had a prior study at [facility], but that is not available on the timeline.  FINDINGS: Lower chest: Nonspecific interlobular septal thickening is seen in the lung bases with some compressive atelectasis.  Hepatobiliary: No focal abnormality within  the liver parenchyma. Probable tiny stones in the lumen of the gallbladder. No intrahepatic or extrahepatic biliary dilation.  Pancreas: No focal mass lesion. No dilatation of the main duct. No intraparenchymal cyst. No peripancreatic edema.  Spleen: No splenomegaly. No focal mass lesion.  Adrenals/Urinary Tract: No adrenal nodule or mass. Kidneys are unremarkable. No evidence for hydroureter urinary bladder has normal features.  Stomach/Bowel: Peristalsis noted in the distal stomach. Duodenum is normally positioned as is the ligament of Treitz. No small bowel wall thickening. No small bowel dilatation. Terminal ileum is normal. The appendix is unremarkable. Mild wall thickening a is identified in the cecum and ascending colon with relative sparing of the proximal transverse colon. More prominent circumferential wall thickening edema is seen in the distal transverse colon, splenic flexure, and descending colon. The distal sigmoid colon and rectum appear preserved.  Vascular/Lymphatic: Atherosclerotic calcification is noted in the wall of the abdominal aorta without aneurysm. The celiac axis and superior mesenteric artery opacify proximally. The inferior mesenteric artery opacifies. The portal vein is patent as is the superior mesenteric vein.  No lymphadenopathy in the abdomen. No pelvic sidewall lymphadenopathy.  Reproductive: Uterus is surgically absent.  No adnexal mass.  Other: No intraperitoneal free fluid.  Musculoskeletal: Bone windows reveal no worrisome lytic or sclerotic osseous lesions. Superior endplate compression deformity at T12 and L1 is age-indeterminate.  IMPRESSION: 1. Mild circumferential wall thickening in the cecum and ascending colon more advanced edema and wall thickening in the splenic flexure and descending colon. Intervening segment of colon appear to be relatively well preserved. Imaging features could be compatible with mid back disorder laboratory colitis. Ischemia is considered less  likely. 2. Question cholelithiasis apparent   Electronically Signed   By: Misty Stanley M.D.   On: 08/21/2014 17:37    Scheduled Meds: . acidophilus  1 capsule Oral Daily  . atorvastatin  10 mg Oral QPM  . heparin  5,000 Units Subcutaneous 3 times per day  . levothyroxine  88 mcg Oral QAC breakfast  . metoprolol  50 mg Oral Daily  . nicotine  21 mg Transdermal Daily  . pantoprazole  40 mg Oral Q1200  . venlafaxine XR  150 mg Oral Q breakfast   Continuous Infusions: . sodium chloride 75 mL/hr at 08/21/14 2125    Principal Problem:   Diarrhea Active Problems:   HTN (hypertension)   HLD (hyperlipidemia)   Hypothyroidism   Tobacco abuse   COPD (chronic obstructive pulmonary disease)   Back pain    Time spent: 35 minutes.     Niel Hummer A  [facility] Pager [phone]. If 7PM-7AM, please contact night-coverage at [email or web address], password [facility] 08/22/2014, 11:29 AM  LOS: 2 days

## 2014-08-23 DIAGNOSIS — A0471 Enterocolitis due to Clostridium difficile, recurrent: Secondary | ICD-10-CM | POA: Insufficient documentation

## 2014-08-23 DIAGNOSIS — A0472 Enterocolitis due to Clostridium difficile, not specified as recurrent: Secondary | ICD-10-CM | POA: Insufficient documentation

## 2014-08-23 DIAGNOSIS — J449 Chronic obstructive pulmonary disease, unspecified: Secondary | ICD-10-CM

## 2014-08-23 LAB — GI PATHOGEN PANEL BY PCR, STOOL
C difficile toxin A/B: POSITIVE
Campylobacter by PCR: NEGATIVE
Cryptosporidium by PCR: NEGATIVE
E coli (ETEC) LT/ST: NEGATIVE
E coli (STEC): NEGATIVE
E coli 0157 by PCR: NEGATIVE
G lamblia by PCR: NEGATIVE
Norovirus GI/GII: NEGATIVE
Rotavirus A by PCR: NEGATIVE
Salmonella by PCR: NEGATIVE
Shigella by PCR: NEGATIVE

## 2014-08-23 LAB — BASIC METABOLIC PANEL
Anion gap: 6 (ref 5–15)
BUN: <5 mg/dL — ABNORMAL LOW (ref 6–23)
CO2: 26 mmol/L (ref 19–32)
Calcium: 8.1 mg/dL — ABNORMAL LOW (ref 8.4–10.5)
Chloride: 104 mEq/L (ref 96–112)
Creatinine, Ser: 0.63 mg/dL (ref 0.50–1.10)
GFR calc Af Amer: >90 mL/min (ref >90)
GFR calc non Af Amer: 86 mL/min — ABNORMAL LOW (ref >90)
Glucose, Bld: 79 mg/dL (ref 70–99)
Potassium: 4.1 mmol/L (ref 3.5–5.1)
Sodium: 136 mmol/L (ref 135–145)

## 2014-08-23 LAB — CBC
HCT: 31.4 % — ABNORMAL LOW (ref 36.0–46.0)
Hemoglobin: 10.1 g/dL — ABNORMAL LOW (ref 12.0–15.0)
MCH: 28.5 pg (ref 26.0–34.0)
MCHC: 32.2 g/dL (ref 30.0–36.0)
MCV: 88.5 fL (ref 78.0–100.0)
Platelets: 341 10*3/uL (ref 150–400)
RBC: 3.55 MIL/uL — ABNORMAL LOW (ref 3.87–5.11)
RDW: 13.6 % (ref 11.5–15.5)
WBC: 5.8 10*3/uL (ref 4.0–10.5)

## 2014-08-23 LAB — GLUCOSE, CAPILLARY: Glucose-Capillary: 74 mg/dL (ref 70–99)

## 2014-08-23 MED ORDER — VANCOMYCIN 50 MG/ML ORAL SOLUTION: 125.0000 mg | ORAL | Status: DC

## 2014-08-23 MED ORDER — VANCOMYCIN 50 MG/ML ORAL SOLUTION: 125.0000 mg | Freq: Four times a day (QID) | ORAL | Status: DC

## 2014-08-23 MED ORDER — VANCOMYCIN 50 MG/ML ORAL SOLUTION: 125.0000 mg | Freq: Two times a day (BID) | ORAL | Status: DC

## 2014-08-23 MED ORDER — VANCOMYCIN 50 MG/ML ORAL SOLUTION: 125.0000 mg | Freq: Every day | ORAL | Status: DC

## 2014-08-23 MED ORDER — VANCOMYCIN 50 MG/ML ORAL SOLUTION
125.0000 mg | Freq: Four times a day (QID) | ORAL | Status: DC
  Administered 2014-08-23 – 2014-08-28 (×18): 125 mg via ORAL
  Filled 2014-08-23 (×21): qty 2.5

## 2014-08-23 NOTE — Progress Notes (Addendum)
[facility]    Subjective: Diarrhea improved   Antibiotics:  Anti-infectives    Start     Dose/Rate Route Frequency Ordered Stop   09/05/14 1600  rifaximin (XIFAXAN) tablet 400 mg     400 mg Oral 3 times daily 08/22/14 1245 09/19/14 1559   08/22/14 1400  vancomycin (VANCOCIN) 50 mg/mL oral solution 125 mg     125 mg Oral 4 times daily 08/22/14 1245 09/05/14 1359   08/22/14 1400  vancomycin (VANCOCIN) 500 mg in sodium chloride irrigation 0.9 % 100 mL ENEMA     500 mg Rectal  Once 08/22/14 1247 08/22/14 1645      Medications: Scheduled Meds: . acidophilus  1 capsule Oral Daily  . atorvastatin  10 mg Oral QPM  . heparin  5,000 Units Subcutaneous 3 times per day  . levothyroxine  88 mcg Oral QAC breakfast  . metoprolol  50 mg Oral Daily  . nicotine  21 mg Transdermal Daily  . pantoprazole  40 mg Oral Q1200  . vancomycin  125 mg Oral QID   Followed by  . [START ON 09/05/2014] rifaximin  400 mg Oral TID  . venlafaxine XR  150 mg Oral Q breakfast   Continuous Infusions: . sodium chloride 1,000 mL (08/23/14 0539)   PRN Meds:.albuterol, HYDROcodone-acetaminophen, ondansetron **OR** ondansetron (ZOFRAN) IV    Objective: Weight change: -7.1 oz (-0.2 kg)  Intake/Output Summary (Last 24 hours) at 08/23/14 1957 Last data filed at 08/23/14 1407  Gross per 24 hour  Intake   1995 ml  Output   1850 ml  Net    145 ml   Blood pressure 108/41, pulse 82, temperature 97.9 F (36.6 C), temperature source Oral, resp. rate 18, height 5\' 4"  (1.626 m), weight 184 lb 4.9 oz (83.6 kg), SpO2 95 %. Temp:  [97.9 F (36.6 C)-98.8 F (37.1 C)] 97.9 F (36.6 C) (01/04 1525) Pulse Rate:  [82-96] 82 (01/04 1525) Resp:  [16-18] 18 (01/04 1525) BP: (108-132)/(40-66) 108/41 mmHg (01/04 1525) SpO2:  [92 %-96 %] 95 % (01/04 1525) Weight:  [184 lb 4.9 oz (83.6 kg)] 184 lb 4.9 oz (83.6 kg) (01/04 5631)  Physical Exam: General: Alert and awake, oriented x3, not in any  acute distress.  Abdomen:nondistended,  Neuro: nonfocal  CBC:  CBC Latest Ref Rng 08/23/2014 08/22/2014 08/21/2014  WBC 4.0 - 10.5 K/uL 5.8 5.9 6.8  Hemoglobin 12.0 - 15.0 g/dL 10.1(L) 10.7(L) 11.1(L)  Hematocrit 36.0 - 46.0 % 31.4(L) 32.7(L) 34.0(L)  Platelets 150 - 400 K/uL 341 357 375      BMET  Recent Labs  08/22/14 0422 08/23/14 0606  NA 134* 136  K 3.6 4.1  CL 102 104  CO2 22 26  GLUCOSE 54* 79  BUN 6 <5*  CREATININE 0.62 0.63  CALCIUM 8.0* 8.1*     Liver Panel  No results for input(s): PROT, ALBUMIN, AST, ALT, ALKPHOS, BILITOT, BILIDIR, IBILI in the last 72 hours.     Sedimentation Rate No results for input(s): ESRSEDRATE in the last 72 hours. C-Reactive Protein No results for input(s): CRP in the last 72 hours.  Micro Results: Recent Results (from the past 720 hour(s))  Culture, blood (routine x 2)     Status: None (Preliminary result)   Collection Time: 08/20/14 11:50 PM  Result Value Ref Range Status   Specimen Description BLOOD RIGHT HAND  Final   Special Requests   Final    BOTTLES DRAWN AEROBIC AND ANAEROBIC  RED 6 CC BLUE 10 CC   Culture   Final           BLOOD CULTURE RECEIVED NO GROWTH TO DATE CULTURE WILL BE HELD FOR 5 DAYS BEFORE ISSUING A FINAL NEGATIVE REPORT Performed at [facility]    Report Status PENDING  Incomplete  Culture, blood (routine x 2)     Status: None (Preliminary result)   Collection Time: 08/20/14 11:58 PM  Result Value Ref Range Status   Specimen Description BLOOD LEFT HAND  Final   Special Requests BOTTLES DRAWN AEROBIC ONLY 4 CC  Final   Culture   Final           BLOOD CULTURE RECEIVED NO GROWTH TO DATE CULTURE WILL BE HELD FOR 5 DAYS BEFORE ISSUING A FINAL NEGATIVE REPORT Performed at [facility]    Report Status PENDING  Incomplete  Clostridium Difficile by PCR     Status: Abnormal   Collection Time: 08/21/14  8:22 AM  Result Value Ref Range Status   C difficile by pcr POSITIVE (A) NEGATIVE  Final    Comment: CRITICAL RESULT CALLED TO, READ BACK BY AND VERIFIED WITH: ZHU,J RN 08/21/14 1843 WOOTEN,K   Stool culture     Status: None (Preliminary result)   Collection Time: 08/21/14  8:22 AM  Result Value Ref Range Status   Specimen Description STOOL  Final   Special Requests NONE  Final   Culture   Final    Culture reincubated for better growth Performed at [facility]    Report Status PENDING  Incomplete    Studies/Results: No results found.    Assessment/Plan:  Principal Problem:   Diarrhea Active Problems:   HTN (hypertension)   HLD (hyperlipidemia)   Hypothyroidism   Tobacco abuse   COPD (chronic obstructive pulmonary disease)   Back pain   Enteritis due to Clostridium difficile    [name] Douglas is a 74 y.o. female with  Recurrent CDI post fecal transplant  #1 Recurrent CDI:   --would give po vancomycin taper over 6 weeks  I am dcing the current order set which is vanco po followed by rifaximin to another one with vancomycin qid x 2 weeks followed by taper  --she can consider fu with ID Vanetta Shawl MD in [facility]) and her GI MD  At [facility] vs followup here with our ID group (Dr. Baxter Flattery) and with Dr. Carlean Purl for consideration of another fecal transplant. I do NOT know protocol her GI MD is using vs what we are using here. Will discuss option of her coming and seeing Dr. Baxter Flattery for opinion on how to proceed. My understanding is that Dr Carlean Purl is using donor stool from central repository but [facility] GI MD using feces from one of his staff (who one would hope was CDI negative carrier, though I would worry about HCW becoming a carrier     LOS: 3 days   Alcide Evener 08/23/2014, 7:57 PM

## 2014-08-23 NOTE — Progress Notes (Signed)
Physical Therapy Treatment Patient Details Name: [name] Douglas MRN: [id number] DOB: [birth year] Today's Date: 08/23/2014    History of Present Illness Patient is a 74 yo female admitted 08/20/14 with persistent diarrhea and colitis.  Patient has had 2 other hospitalizations for this issue since 06/19/14.  Patient with weight loss and general weakness.  PMH:  HLD, Hypothyroidism, COPD, HTN, back pain.    PT Comments    Patient able to walk out into hallway and is feeling somewhat better. Still requiring Min A due to fatigue and generalized weakness. Patient lives alone and unsure if she will have assistance once discharge. If patient does not progress to Mod I level, may need to consider SNF. I bought this up to patient but unsure if she will be open to this idea. Will continue with current POC.   Follow Up Recommendations  Home health PT;Supervision - Intermittent (may consider SNF if slow to progress)     Equipment Recommendations  None recommended by PT    Recommendations for Other Services       Precautions / Restrictions Precautions Precautions: Fall Precaution Comments: Patient reports 1 fall in early December.  "Not sure what happened".  Patient became "faint" during gait on evaluation. Restrictions Weight Bearing Restrictions: No    Mobility  Bed Mobility Overal bed mobility: Modified Independent             General bed mobility comments: pt received up in chair  Transfers Overall transfer level: Needs assistance Equipment used: None Transfers: Sit to/from Stand Sit to Stand: Min guard         General transfer comment: no physical assist, but unsteady upon initially standing but able to self correct.   Ambulation/Gait Ambulation/Gait assistance: Min assist Ambulation Distance (Feet): 200 Feet Assistive device: None;1 person hand held assist Gait Pattern/deviations: Step-through pattern;Decreased stride length;Staggering left;Staggering right Gait velocity:  decreased   General Gait Details: Patient with increased sway with gait and one LOB requiring mIn A and self correction by the patient. Patient did not want to use RW but I would assess with RW next session. Patient was attempting to reach out for rail in hallway   Stairs            Wheelchair Mobility    Modified Rankin (Stroke Patients Only)       Balance                                    Cognition Arousal/Alertness: Awake/alert Behavior During Therapy: WFL for tasks assessed/performed Overall Cognitive Status: Within Functional Limits for tasks assessed                      Exercises      General Comments        Pertinent Vitals/Pain Pain Assessment: No/denies pain    Home Living Family/patient expects to be discharged to:: Private residence Living Arrangements: Alone Available Help at Discharge: Family;Available PRN/intermittently Type of Home: Apartment Home Access: Elevator   Home Layout: One level Home Equipment: Walker - 2 wheels;Cane - single point (had a make shift shower seat)      Prior Function Level of Independence: Needs assistance  Gait / Transfers Assistance Needed: was using a RW as weakness progressed ADL's / Homemaking Assistance Needed: could perform self care and light meal prep, daughters assisted with housekeeping, transportation and groceries Comments: Was driving prior to 50/53/97  when abdominal illness began.   PT Goals (current goals can now be found in the care plan section) Acute Rehab PT Goals Patient Stated Goal: To get stronger.  To return home Progress towards PT goals: Progressing toward goals    Frequency  Min 3X/week    PT Plan Current plan remains appropriate    Co-evaluation             End of Session Equipment Utilized During Treatment: Gait belt Activity Tolerance: Patient tolerated treatment well;Patient limited by fatigue Patient left: in bed;with call bell/phone within reach      Time: 7628-3151 PT Time Calculation (min) (ACUTE ONLY): 15 min  Charges:  $Gait Training: 8-22 mins                    G Codes:      Jacqualyn Posey 08/23/2014, 2:59 PM 08/23/2014 Jacqualyn Posey PTA [phone] pager [phone] office

## 2014-08-23 NOTE — Progress Notes (Signed)
[facility] PROGRESS NOTE  [name] Douglas IRW:[id number] DOB: [birth year] DOA: 08/20/2014   PCP: [name] Guise, MD  Brief history of present illness  [name] is 74 year old female with hyperlipidemia, hypothyroidism, COPD, tobacco abuse, presented with persistent diarrhea. Patient was hospitalized due to rectal bleeding 10/31-11/4 in [name]. She had CT scan which showed ileocolitis. On 08/05/14 vision was tested positive for C. difficile colitis by gastroenterology in [facility]. Patient was started on florastor and vancomycin with no significant improvement. Fecal mirobiodata transplant was performed, vancomycin was discontinued. She was started on lactobacillus and discharged home.  Patient was continuing to have abdominal pain, watery diarrhea with mucus in the stools, because of persistent symptoms, patient presented to [name] for further evaluation.   Assessment/Plan: 1-Recurrent, persistent C diff.  -On October she was treated with cipro and flagyl and prednisone taper for colitis..On 12-07 diagnosed with C diff . Treated with florastor, vancomycin for 7 days , and fecal transplant. She underwent colonoscopy at [facility] which showed findings of ischemic colitis.  -C diff positive this admission, stool culture, ova and  parasite pending.  -GI consulted, currently on rifaximin, oral and rectal vancomycin  -CT scan abdomen: mild wall thickening in the cecum and ascending colon.  -ID consulted for treatments recommendation. Appreciate Dr [name] assistance.  - Advance to soft solids today, patient reports improving, no diarrhea this morning  2-Hypokalemia; resolved   3-Hypothyroidism: Elevated TSH; free T 3 and T4 stable    4-HTN; Continue with metoprolol holder parameters. Hold Norvasc.   5-COPD; PRN nebulizer.   Code Status: Full Code   Family Communication:   Disposition Plan:   Consultants:  [facility]  Procedures:  none  Antibiotics: Vancomycin  Subjective  Pleasant this morning, states no BM today. Last stool at 8 PM yesterday  Objective:  BP 132/42 mmHg  Pulse 96  Temp(Src) 98.8 F (37.1 C) (Oral)  Resp 18  Ht 5\' 4"  (1.626 m)  Wt 83.6 kg (184 lb 4.9 oz)  BMI 31.62 kg/m2  SpO2 96%   Intake/Output Summary (Last 24 hours) at 08/23/14 1330 Last data filed at 08/23/14 0900  Gross per 24 hour  Intake   2055 ml  Output   1750 ml  Net    305 ml   Filed Weights   08/21/14 0533 08/22/14 0609 08/23/14 0616  Weight: 83.9 kg (184 lb 15.5 oz) 83.8 kg (184 lb 11.9 oz) 83.6 kg (184 lb 4.9 oz)    Exam:   General:  Alert and oriented 3, NAD.   CVS: S1 S2 RRR  chest: CTAB  Abdomen: bs present, soft, obese, distended, mild tenderness.   Ext: no c/c/ edema.   Data Reviewed: Basic Metabolic Panel:  Recent Labs Lab 08/20/14 1603 08/21/14 0433 08/22/14 0422 08/23/14 0606  NA 135 138 134* 136  K 3.7 3.4* 3.6 4.1  CL 100 106 102 104  CO2 25 20 22 26   GLUCOSE 107* 82 54* 79  BUN 8 5* 6 <5*  CREATININE 0.77 0.62 0.62 0.63  CALCIUM 8.7 8.2* 8.0* 8.1*   Liver Function Tests:  Recent Labs Lab 08/20/14 1603  AST 21  ALT 13  ALKPHOS 123*  BILITOT 0.5  PROT 6.0  ALBUMIN 2.8*    Recent Labs Lab 08/20/14 1603  LIPASE 18   No results for input(s): AMMONIA in the last 168 hours. CBC:  Recent Labs Lab 08/20/14 1603 08/21/14 0433 08/22/14 0422 08/23/14 0606  WBC 10.2 6.8 5.9 5.8  NEUTROABS 7.4  --   --   --   HGB 12.1 11.1* 10.7* 10.1*  HCT 36.9 34.0* 32.7* 31.4*  MCV 90.0 90.2 90.3 88.5  PLT 423* 375 357 341   Cardiac Enzymes: No results for input(s): CKTOTAL, CKMB, CKMBINDEX, TROPONINI in the last 168 hours. BNP (last 3 results) No results for input(s): PROBNP in the last 8760 hours. CBG:  Recent Labs Lab 08/21/14 0831 08/22/14 0701 08/22/14 0814 08/23/14 0746  GLUCAP 87 82 80 74    Recent Results (from the past 240 hour(s))   Culture, blood (routine x 2)     Status: None (Preliminary result)   Collection Time: 08/20/14 11:50 PM  Result Value Ref Range Status   Specimen Description BLOOD RIGHT HAND  Final   Special Requests   Final    BOTTLES DRAWN AEROBIC AND ANAEROBIC RED 6 CC BLUE 10 CC   Culture   Final           BLOOD CULTURE RECEIVED NO GROWTH TO DATE CULTURE WILL BE HELD FOR 5 DAYS BEFORE ISSUING A FINAL NEGATIVE REPORT Performed at [facility]    Report Status PENDING  Incomplete  Culture, blood (routine x 2)     Status: None (Preliminary result)   Collection Time: 08/20/14 11:58 PM  Result Value Ref Range Status   Specimen Description BLOOD LEFT HAND  Final   Special Requests BOTTLES DRAWN AEROBIC ONLY 4 CC  Final   Culture   Final           BLOOD CULTURE RECEIVED NO GROWTH TO DATE CULTURE WILL BE HELD FOR 5 DAYS BEFORE ISSUING A FINAL NEGATIVE REPORT Performed at [facility]    Report Status PENDING  Incomplete  Clostridium Difficile by PCR     Status: Abnormal   Collection Time: 08/21/14  8:22 AM  Result Value Ref Range Status   C difficile by pcr POSITIVE (A) NEGATIVE Final    Comment: CRITICAL RESULT CALLED TO, READ BACK BY AND VERIFIED WITH: ZHU,J RN 08/21/14 1843 [name]   Stool culture     Status: None (Preliminary result)   Collection Time: 08/21/14  8:22 AM  Result Value Ref Range Status   Specimen Description STOOL  Final   Special Requests NONE  Final   Culture   Final    Culture reincubated for better growth Performed at [facility]    Report Status PENDING  Incomplete     Studies: Ct Abdomen Pelvis W Contrast  08/21/2014   CLINICAL DATA:  Initial encounter for 2 months with intermittent nausea and vomiting.  EXAM: CT ABDOMEN AND PELVIS WITH CONTRAST  TECHNIQUE: Multidetector CT imaging of the abdomen and pelvis was performed using the standard protocol following bolus administration of intravenous contrast.  CONTRAST:  169mL OMNIPAQUE IOHEXOL 300  MG/ML  SOLN  COMPARISON:  The patient reportedly had a prior study at [facility], but that is not available on the timeline.  FINDINGS: Lower chest: Nonspecific interlobular septal thickening is seen in the lung bases with some compressive atelectasis.  Hepatobiliary: No focal abnormality within the liver parenchyma. Probable tiny stones in the lumen of the gallbladder. No intrahepatic or extrahepatic biliary dilation.  Pancreas: No focal mass lesion. No dilatation of the main duct. No intraparenchymal cyst. No peripancreatic edema.  Spleen: No splenomegaly. No focal mass lesion.  Adrenals/Urinary Tract: No adrenal nodule or mass. Kidneys are unremarkable. No evidence for hydroureter urinary bladder has normal features.  Stomach/Bowel:  Peristalsis noted in the distal stomach. Duodenum is normally positioned as is the ligament of Treitz. No small bowel wall thickening. No small bowel dilatation. Terminal ileum is normal. The appendix is unremarkable. Mild wall thickening a is identified in the cecum and ascending colon with relative sparing of the proximal transverse colon. More prominent circumferential wall thickening edema is seen in the distal transverse colon, splenic flexure, and descending colon. The distal sigmoid colon and rectum appear preserved.  Vascular/Lymphatic: Atherosclerotic calcification is noted in the wall of the abdominal aorta without aneurysm. The celiac axis and superior mesenteric artery opacify proximally. The inferior mesenteric artery opacifies. The portal vein is patent as is the superior mesenteric vein.  No lymphadenopathy in the abdomen. No pelvic sidewall lymphadenopathy.  Reproductive: Uterus is surgically absent.  No adnexal mass.  Other: No intraperitoneal free fluid.  Musculoskeletal: Bone windows reveal no worrisome lytic or sclerotic osseous lesions. Superior endplate compression deformity at T12 and L1 is age-indeterminate.  IMPRESSION: 1. Mild circumferential  wall thickening in the cecum and ascending colon more advanced edema and wall thickening in the splenic flexure and descending colon. Intervening segment of colon appear to be relatively well preserved. Imaging features could be compatible with mid back disorder laboratory colitis. Ischemia is considered less likely. 2. Question cholelithiasis apparent   Electronically Signed   By: Misty Stanley M.D.   On: 08/21/2014 17:37    Scheduled Meds: . acidophilus  1 capsule Oral Daily  . atorvastatin  10 mg Oral QPM  . heparin  5,000 Units Subcutaneous 3 times per day  . levothyroxine  88 mcg Oral QAC breakfast  . metoprolol  50 mg Oral Daily  . nicotine  21 mg Transdermal Daily  . pantoprazole  40 mg Oral Q1200  . vancomycin  125 mg Oral QID   Followed by  . [START ON 09/05/2014] rifaximin  400 mg Oral TID  . venlafaxine XR  150 mg Oral Q breakfast   Continuous Infusions: . sodium chloride 1,000 mL (08/23/14 0539)    Galilea Quito M.D. [facility] 08/23/2014, 1:37 PM  Pager: [phone]

## 2014-08-23 NOTE — Progress Notes (Signed)
Patient ID: [name] [facility], female   DOB: Jul 06, 1941, 74 y.o.   MRN: [id number] [facility] Gastroenterology Progress Note  [name] Douglas 74 y.o. 02-29-1941   Subjective: Sitting in chair in good spirits stating that this is the first morning she has had in months where she did not have a BM. One loose stool at 8 pm last night.   Objective: Vital signs: Filed Vitals:   08/23/14 0616  BP: 123/66  Pulse: 98  Temp: 98.8 F (37.1 C)  Resp: 18    Physical Exam: Gen: elderly, alert, no acute distress  Abd: soft, minimally tender without guarding  Lab Results:  Recent Labs  08/22/14 0422 08/23/14 0606  NA 134* 136  K 3.6 4.1  CL 102 104  CO2 22 26  GLUCOSE 54* 79  BUN 6 <5*  CREATININE 0.62 0.63  CALCIUM 8.0* 8.1*    Recent Labs  08/20/14 1603  AST 21  ALT 13  ALKPHOS 123*  BILITOT 0.5  PROT 6.0  ALBUMIN 2.8*    Recent Labs  08/20/14 1603  08/22/14 0422 08/23/14 0606  WBC 10.2  < > 5.9 5.8  NEUTROABS 7.4  --   --   --   HGB 12.1  < > 10.7* 10.1*  HCT 36.9  < > 32.7* 31.4*  MCV 90.0  < > 90.3 88.5  PLT 423*  < > 357 341  < > = values in this interval not displayed.    Assessment/Plan: Recurrent C diff - improving on Rifaximin, PO and PR Vanco. Continue supportive care. Appreciate ID recs. Will follow.   [name] C. 08/23/2014, 11:18 AM

## 2014-08-23 NOTE — Evaluation (Signed)
Occupational Therapy Evaluation Patient Details Name: [name] Douglas MRN: [id number] DOB: 1941/04/27 Today's Date: 08/23/2014    History of Present Illness Patient is a 74 yo female admitted 08/20/14 with persistent diarrhea and colitis.  Patient has had 2 other hospitalizations for this issue since 06/19/14.  Patient with weight loss and general weakness.  PMH:  HLD, Hypothyroidism, COPD, HTN, back pain.   Clinical Impression   Pt was modified independent in self care and light meal prep prior to admission, she was needing increasingly more help with IADL as she was getting weaker and diarrhea worse.  Pt feeling better today.  Up in chair x 3 hours and eager to eat solid food for lunch.  Pt with generalized weakness and balance deficits interfering with ADL and ADL transfers.  Will follow acutely.  Do not anticipate pt will need follow up if she continues to improve.    Follow Up Recommendations  No OT follow up    Equipment Recommendations       Recommendations for Other Services       Precautions / Restrictions Precautions Precautions: Fall Precaution Comments: Patient reports 1 fall in early December.  "Not sure what happened".  Patient became "faint" during gait on evaluation. Restrictions Weight Bearing Restrictions: No      Mobility Bed Mobility               General bed mobility comments: pt received up in chair  Transfers Overall transfer level: Needs assistance Equipment used: None Transfers: Sit to/from Stand Sit to Stand: Min guard         General transfer comment: no physical assist, but unsteady upon initially standing    Balance                                            ADL Overall ADL's : Needs assistance/impaired Eating/Feeding: Independent;Sitting   Grooming: Standing;Wash/dry hands;Min guard   Upper Body Bathing: Set up;Sitting   Lower Body Bathing: Min guard;Sit to/from stand   Upper Body Dressing : Set up;Sitting    Lower Body Dressing: Min guard;Sit to/from stand   Toilet Transfer: Minimal assistance;Ambulation;Regular Museum/gallery exhibitions officer and Hygiene: Min guard;Sit to/from stand               Vision                     Perception     Praxis      Pertinent Vitals/Pain Pain Assessment: No/denies pain     Hand Dominance Right   Extremity/Trunk Assessment Upper Extremity Assessment Upper Extremity Assessment: Generalized weakness   Lower Extremity Assessment Lower Extremity Assessment: Generalized weakness   Cervical / Trunk Assessment Cervical / Trunk Assessment: Normal   Communication Communication Communication: No difficulties   Cognition Arousal/Alertness: Awake/alert Behavior During Therapy: WFL for tasks assessed/performed Overall Cognitive Status: Within Functional Limits for tasks assessed                     General Comments       Exercises       Shoulder Instructions      Home Living Family/patient expects to be discharged to:: Private residence Living Arrangements: Alone Available Help at Discharge: Family;Available PRN/intermittently Type of Home: Apartment Home Access: Elevator     Home Layout: One level  Bathroom Shower/Tub: Teacher, early years/pre: Standard     Home Equipment: Environmental consultant - 2 wheels;Cane - single point (had a make shift shower seat)          Prior Functioning/Environment Level of Independence: Needs assistance  Gait / Transfers Assistance Needed: was using a RW as weakness progressed ADL's / Homemaking Assistance Needed: could perform self care and light meal prep, daughters assisted with housekeeping, transportation and groceries   Comments: Was driving prior to 25/42/70 when abdominal illness began.    OT Diagnosis: Generalized weakness   OT Problem List: Decreased strength;Impaired balance (sitting and/or standing);Decreased activity tolerance;Decreased knowledge of  use of DME or AE   OT Treatment/Interventions: Self-care/ADL training;DME and/or AE instruction;Patient/family education    OT Goals(Current goals can be found in the care plan section) Acute Rehab OT Goals Patient Stated Goal: To get stronger.  To return home OT Goal Formulation: With patient Time For Goal Achievement: 09/06/14 Potential to Achieve Goals: Good ADL Goals Pt Will Perform Grooming: with modified independence;standing Pt Will Perform Lower Body Bathing: with modified independence;sit to/from stand Pt Will Perform Lower Body Dressing: with modified independence;sit to/from stand Pt Will Transfer to Toilet: with modified independence;ambulating;regular height toilet Pt Will Perform Toileting - Clothing Manipulation and hygiene: with modified independence;sit to/from stand Pt Will Perform Tub/Shower Transfer: with modified independence;ambulating;shower seat;rolling walker  OT Frequency: Min 2X/week   Barriers to D/C:            Co-evaluation              End of Session Equipment Utilized During Treatment: Gait belt  Activity Tolerance: Patient tolerated treatment well Patient left: in chair;with call bell/phone within reach   Time: 1229-1253 OT Time Calculation (min): 24 min Charges:  OT General Charges $OT Visit: 1 Procedure OT Evaluation $Initial OT Evaluation Tier I: 1 Procedure OT Treatments $Self Care/Home Management : 8-22 mins G-Codes:    Malka So 08/23/2014, 1:02 PM [phone]

## 2014-08-23 NOTE — Care Management Note (Addendum)
    Page 1 of 2   08/29/2014     9:02:11 AM CARE MANAGEMENT NOTE 08/29/2014  Patient:  [name] Douglas, [name] Douglas   Account Number:  [id number]  Date Initiated:  08/23/2014  Documentation initiated by:  Tomi Bamberger  Subjective/Objective Assessment:   dx c diff colitls  admit- from home alone.     Action/Plan:   pt eval- rec hhpt   Anticipated DC Date:  08/24/2014   Anticipated DC Plan:  [place]  CM consult      [facility] Choice  HOME HEALTH   Choice offered to / List presented to:  C-1 Patient   DME arranged  NEBULIZER MACHINE      DME agency  [place] arranged  [place] RN      [place].   Status of service:  Completed, signed off Medicare Important Message given?  YES (If response is "NO", the following Medicare IM given date fields will be blank) Date Medicare IM given:  08/23/2014 Medicare IM given by:  Tomi Bamberger Date Additional Medicare IM given:  08/26/2014 Additional Medicare IM given by:  Tomi Bamberger  Discharge Disposition:  [place]  Per UR Regulation:    If discussed at Long Length of Stay Meetings, dates discussed:    Comments:  08/29/14 09:00 CM notes F2F place and addons of OT and RN placed.  CM called add ons to [facility] rep, Stephanie.  No other CM needs were communicated.  Mariane Masters, BSN, [place]ear[place] Lesch [phone].  08/28/14 11:50 CM spoke with pt to offer choice of home health agency.  Pt chooses [facility] to render HHPT.  Orders and F2F requested of MD.  Address and contact information verified  by pt.  CM called [facility] DME rep Jeneen Rinks to please deliver the nebulizer machine to room prior to discharge. Referral called to [facility] rep, Stephanie.  No other CM needs were communicated.  Mariane Masters, BSN, [place] [phone].  08/23/14 [place], BSN [phone] patient with c diff, advancing diet. NCM will cont to follow for dc needs.

## 2014-08-24 LAB — HEPATITIS PANEL, ACUTE
HCV Ab: NEGATIVE
Hep A IgM: NONREACTIVE
Hep B C IgM: NONREACTIVE
Hepatitis B Surface Ag: NEGATIVE

## 2014-08-24 LAB — OVA AND PARASITE EXAMINATION

## 2014-08-24 LAB — STOOL CULTURE

## 2014-08-24 LAB — HIV ANTIBODY (ROUTINE TESTING W REFLEX): HIV 1&2 Ab, 4th Generation: NONREACTIVE

## 2014-08-24 LAB — GLUCOSE, CAPILLARY: Glucose-Capillary: 74 mg/dL (ref 70–99)

## 2014-08-24 NOTE — Progress Notes (Signed)
[place] for Infectious Disease    Subjective: Diarrhea worsened after diet was advanced  Antibiotics:  Anti-infectives    Start     Dose/Rate Route Frequency Ordered Stop   09/28/14 1000  vancomycin (VANCOCIN) 50 mg/mL oral solution 125 mg     125 mg Oral Every 3 DAYS 08/23/14 2004 10/13/14 0959   09/22/14 1000  vancomycin (VANCOCIN) 50 mg/mL oral solution 125 mg  Status:  Discontinued     125 mg Oral Every 3 DAYS 08/23/14 2003 08/23/14 2004   09/22/14 1000  vancomycin (VANCOCIN) 50 mg/mL oral solution 125 mg     125 mg Oral Every other day 08/23/14 2004 09/29/14 0959   09/14/14 1000  vancomycin (VANCOCIN) 50 mg/mL oral solution 125 mg  Status:  Discontinued     125 mg Oral Every other day 08/23/14 2003 08/23/14 2004   09/14/14 1000  vancomycin (VANCOCIN) 50 mg/mL oral solution 125 mg     125 mg Oral Daily 08/23/14 2004 09/21/14 0959   09/07/14 1000  vancomycin (VANCOCIN) 50 mg/mL oral solution 125 mg  Status:  Discontinued     125 mg Oral Daily 08/23/14 2003 08/23/14 2004   09/06/14 2200  vancomycin (VANCOCIN) 50 mg/mL oral solution 125 mg     125 mg Oral 2 times daily 08/23/14 2004 09/13/14 2159   09/05/14 1600  rifaximin (XIFAXAN) tablet 400 mg  Status:  Discontinued     400 mg Oral 3 times daily 08/22/14 1245 08/23/14 2002   08/30/14 2200  vancomycin (VANCOCIN) 50 mg/mL oral solution 125 mg  Status:  Discontinued     125 mg Oral 2 times daily 08/23/14 2003 08/23/14 2004   08/23/14 2200  vancomycin (VANCOCIN) 50 mg/mL oral solution 125 mg  Status:  Discontinued     125 mg Oral 4 times daily 08/23/14 2003 08/23/14 2004   08/23/14 2200  vancomycin (VANCOCIN) 50 mg/mL oral solution 125 mg     125 mg Oral 4 times daily 08/23/14 2004 09/06/14 2159   08/22/14 1400  vancomycin (VANCOCIN) 50 mg/mL oral solution 125 mg  Status:  Discontinued     125 mg Oral 4 times daily 08/22/14 1245 08/23/14 2002   08/22/14 1400  vancomycin (VANCOCIN) 500 mg in sodium chloride irrigation 0.9  % 100 mL ENEMA     500 mg Rectal  Once 08/22/14 1247 08/22/14 1645      Medications: Scheduled Meds: . acidophilus  1 capsule Oral Daily  . atorvastatin  10 mg Oral QPM  . heparin  5,000 Units Subcutaneous 3 times per day  . levothyroxine  88 mcg Oral QAC breakfast  . metoprolol  50 mg Oral Daily  . nicotine  21 mg Transdermal Daily  . vancomycin  125 mg Oral QID   Followed by  . [START ON 09/06/2014] vancomycin  125 mg Oral BID   Followed by  . [START ON 09/14/2014] vancomycin  125 mg Oral Daily   Followed by  . [START ON 09/22/2014] vancomycin  125 mg Oral QODAY   Followed by  . [START ON 09/28/2014] vancomycin  125 mg Oral Q3 days  . venlafaxine XR  150 mg Oral Q breakfast   Continuous Infusions: . sodium chloride 75 mL/hr at 08/23/14 2005   PRN Meds:.albuterol, HYDROcodone-acetaminophen, ondansetron **OR** ondansetron (ZOFRAN) IV    Objective: Weight change: -1 lb 1.6 oz (-0.5 kg)  Intake/Output Summary (Last 24 hours) at 08/24/14 1408 Last data filed at 08/24/14 0800  Gross  per 24 hour  Intake      0 ml  Output   2450 ml  Net  -2450 ml   Blood pressure 134/44, pulse 84, temperature 98.4 F (36.9 C), temperature source Oral, resp. rate 16, height 5\' 4"  (1.626 m), weight 183 lb 3.2 oz (83.1 kg), SpO2 93 %. Temp:  [97.9 F (36.6 C)-99.2 F (37.3 C)] 98.4 F (36.9 C) (01/05 0610) Pulse Rate:  [82-85] 84 (01/05 1041) Resp:  [16-18] 16 (01/05 0610) BP: (108-134)/(27-71) 134/44 mmHg (01/05 1041) SpO2:  [93 %-95 %] 93 % (01/05 0610) Weight:  [183 lb 3.2 oz (83.1 kg)] 183 lb 3.2 oz (83.1 kg) (01/05 1443)  Physical Exam: General: Alert and awake, oriented x3, not in any acute distress.  Abdomen: Diffusely tender positive bowel sounds  Neuro: nonfocal  CBC:  CBC Latest Ref Rng 08/23/2014 08/22/2014 08/21/2014  WBC 4.0 - 10.5 K/uL 5.8 5.9 6.8  Hemoglobin 12.0 - 15.0 g/dL 10.1(L) 10.7(L) 11.1(L)  Hematocrit 36.0 - 46.0 % 31.4(L) 32.7(L) 34.0(L)  Platelets 150 - 400 K/uL  341 357 375      BMET  Recent Labs  08/22/14 0422 08/23/14 0606  NA 134* 136  K 3.6 4.1  CL 102 104  CO2 22 26  GLUCOSE 54* 79  BUN 6 <5*  CREATININE 0.62 0.63  CALCIUM 8.0* 8.1*     Liver Panel  No results for input(s): PROT, ALBUMIN, AST, ALT, ALKPHOS, BILITOT, BILIDIR, IBILI in the last 72 hours.     Sedimentation Rate No results for input(s): ESRSEDRATE in the last 72 hours. C-Reactive Protein No results for input(s): CRP in the last 72 hours.  Micro Results: Recent Results (from the past 720 hour(s))  Culture, blood (routine x 2)     Status: None (Preliminary result)   Collection Time: 08/20/14 11:50 PM  Result Value Ref Range Status   Specimen Description BLOOD RIGHT HAND  Final   Special Requests   Final    BOTTLES DRAWN AEROBIC AND ANAEROBIC RED 6 CC BLUE 10 CC   Culture   Final           BLOOD CULTURE RECEIVED NO GROWTH TO DATE CULTURE WILL BE HELD FOR 5 DAYS BEFORE ISSUING A FINAL NEGATIVE REPORT Performed at [facility]    Report Status PENDING  Incomplete  Culture, blood (routine x 2)     Status: None (Preliminary result)   Collection Time: 08/20/14 11:58 PM  Result Value Ref Range Status   Specimen Description BLOOD LEFT HAND  Final   Special Requests BOTTLES DRAWN AEROBIC ONLY 4 CC  Final   Culture   Final           BLOOD CULTURE RECEIVED NO GROWTH TO DATE CULTURE WILL BE HELD FOR 5 DAYS BEFORE ISSUING A FINAL NEGATIVE REPORT Performed at [facility]    Report Status PENDING  Incomplete  Clostridium Difficile by PCR     Status: Abnormal   Collection Time: 08/21/14  8:22 AM  Result Value Ref Range Status   C difficile by pcr POSITIVE (A) NEGATIVE Final    Comment: CRITICAL RESULT CALLED TO, READ BACK BY AND VERIFIED WITH: ZHU,J RN 08/21/14 1843 WOOTEN,K   Stool culture     Status: None   Collection Time: 08/21/14  8:22 AM  Result Value Ref Range Status   Specimen Description STOOL  Final   Special Requests NONE  Final    Culture   Final    NO  SALMONELLA, SHIGELLA, CAMPYLOBACTER, YERSINIA, OR E.COLI 0157:H7 ISOLATED Performed at [facility]    Report Status 08/24/2014 FINAL  Final    Studies/Results: No results found.    Assessment/Plan:  Principal Problem:   Diarrhea Active Problems:   HTN (hypertension)   HLD (hyperlipidemia)   Hypothyroidism   Tobacco abuse   COPD (chronic obstructive pulmonary disease)   Back pain   Enteritis due to Clostridium difficile   Colitis due to Clostridium difficile   Recurrent Clostridium difficile diarrhea    [name] Douglas is a 74 y.o. female with  Recurrent CDI post fecal transplant  #1 Recurrent CDI:   --would give po vancomycin taper over 6 weeks  --we discussed option of following up with Dr. Baxter Flattery and our group + Dr. Carlean Purl vs with her GI MD in [facility] and she preferred the latter. She should also be seen by ID MD @ [facility] our good friend Dr. Adrian Prows who has clinic in [employer]  I will sign off for now  Please call with further questions       LOS: 4 days   Rhina Brackett Dam 08/24/2014, 2:08 PM

## 2014-08-24 NOTE — Progress Notes (Signed)
[facility] Progress Note  Subjective: Patient had resumption of diarrhea and nausea but no vomiting with advancement of diet yesterday after initial apparent improvement in her diarrhea. Denies pain or bleeding  Objective: Vital signs in last 24 hours: Temp:  [97.9 F (36.6 C)-99.2 F (37.3 C)] 98.4 F (36.9 C) (01/05 0610) Pulse Rate:  [82-96] 85 (01/04 2248) Resp:  [16-18] 16 (01/05 0610) BP: (108-132)/(27-71) 119/71 mmHg (01/05 0610) SpO2:  [93 %-95 %] 93 % (01/05 0610) Weight:  [83.1 kg (183 lb 3.2 oz)] 83.1 kg (183 lb 3.2 oz) (01/05 1696) Weight change: -0.5 kg (-1 lb 1.6 oz)   PE: Unchanged  Lab Results: Results for orders placed or performed during the hospital encounter of 08/20/14 (from the past 24 hour(s))  HIV antibody     Status: None   Collection Time: 08/24/14  4:33 AM  Result Value Ref Range   HIV 1&2 Ab, 4th Generation NONREACTIVE NONREACTIVE  Hepatitis panel, acute     Status: None   Collection Time: 08/24/14  4:33 AM  Result Value Ref Range   Hepatitis B Surface Ag NEGATIVE NEGATIVE   HCV Ab NEGATIVE NEGATIVE   Hep A IgM NON REACTIVE NON REACTIVE   Hep B C IgM NON REACTIVE NON REACTIVE  Glucose, capillary     Status: None   Collection Time: 08/24/14  8:01 AM  Result Value Ref Range   Glucose-Capillary 74 70 - 99 mg/dL    Studies/Results: No results found.    Assessment: Persistent C. difficile colitis possibly superimposed on initial bout of ischemic colitis  Plan: Continue current aggressive medical therapy aimed at C. difficile. If does not improve within 3 or 4 days or begins having bleeding or other signs of toxicity may need ref the flexible sigmoidoscopy or colonoscopy.    VELFY,BOFB C 08/24/2014, 11:27 AM

## 2014-08-24 NOTE — Progress Notes (Signed)
[facility] PROGRESS NOTE  KENNADIE BRENNER SVX:[id number] DOB: [birth year] DOA: 08/20/2014   PCP: Lavera Guise, MD  Brief history of present illness  [name] is 74 year old female with hyperlipidemia, hypothyroidism, COPD, tobacco abuse, presented with persistent diarrhea. Patient was hospitalized due to rectal bleeding 10/31-11/4 in [facility]. She had CT scan which showed ileocolitis. On 08/05/14 vision was tested positive for C. difficile colitis by gastroenterology in [facility]. Patient was started on florastor and vancomycin with no significant improvement. Fecal mirobiodata transplant was performed, vancomycin was discontinued. She was started on lactobacillus and discharged home.  Patient was continuing to have abdominal pain, watery diarrhea with mucus in the stools, because of persistent symptoms, patient presented to [facility] ED for further evaluation.   Assessment/Plan: 1-Recurrent, persistent C diff. - Started having diarrhea this morning again -On October she was treated with cipro and flagyl and prednisone taper for colitis..On 12-07 diagnosed with C diff . Treated with florastor, vancomycin for 7 days , and fecal transplant. She underwent colonoscopy at [facility] which showed findings of ischemic colitis.  -C diff positive this admission, GI pathogen panel negative except C. difficile -GI consulted, currently on acidophilus, vancomycin taper dose per ID recommendations  -CT scan abdomen: mild wall thickening in the cecum and ascending colon.  -ID consulted for treatments recommendation. Appreciate Dr Algis Downs assistance.  - Diarrhea worse after starting solids, will downgrade to full liquids  2-Hypokalemia; resolved   3-Hypothyroidism: Elevated TSH; free T 3 and T4 stable    4-HTN; Continue with metoprolol holder parameters. Hold Norvasc.   5-COPD; PRN nebulizer.   Code Status: Full Code   Family Communication:   Disposition Plan:    Consultants:  [facility]  Procedures:  none  Antibiotics: Vancomycin  Subjective  Dietary started after eating solids yesterday, one episode of diarrhea this morning prior to my encounter  Objective:  BP 134/44 mmHg  Pulse 84  Temp(Src) 98.4 F (36.9 C) (Oral)  Resp 16  Ht 5\' 4"  (1.626 m)  Wt 83.1 kg (183 lb 3.2 oz)  BMI 31.43 kg/m2  SpO2 93%   Intake/Output Summary (Last 24 hours) at 08/24/14 1205 Last data filed at 08/24/14 0800  Gross per 24 hour  Intake     60 ml  Output   2750 ml  Net  -2690 ml   Filed Weights   08/22/14 0609 08/23/14 0616 08/24/14 0614  Weight: 83.8 kg (184 lb 11.9 oz) 83.6 kg (184 lb 4.9 oz) 83.1 kg (183 lb 3.2 oz)    Exam:   General:  Alert and oriented 3, NAD.   CVS: S1 S2 RRR  chest: CTAB  Abdomen: bs present, soft, obese, NT  Ext: no c/c/ edema.   Data Reviewed: Basic Metabolic Panel:  Recent Labs Lab 08/20/14 1603 08/21/14 0433 08/22/14 0422 08/23/14 0606  NA 135 138 134* 136  K 3.7 3.4* 3.6 4.1  CL 100 106 102 104  CO2 25 20 22 26   GLUCOSE 107* 82 54* 79  BUN 8 5* 6 <5*  CREATININE 0.77 0.62 0.62 0.63  CALCIUM 8.7 8.2* 8.0* 8.1*   Liver Function Tests:  Recent Labs Lab 08/20/14 1603  AST 21  ALT 13  ALKPHOS 123*  BILITOT 0.5  PROT 6.0  ALBUMIN 2.8*    Recent Labs Lab 08/20/14 1603  LIPASE 18   No results for input(s): AMMONIA in the last 168 hours. CBC:  Recent Labs Lab 08/20/14 1603 08/21/14 2330 08/22/14 0422 08/23/14 0762  WBC 10.2 6.8 5.9 5.8  NEUTROABS 7.4  --   --   --   HGB 12.1 11.1* 10.7* 10.1*  HCT 36.9 34.0* 32.7* 31.4*  MCV 90.0 90.2 90.3 88.5  PLT 423* 375 357 341   Cardiac Enzymes: No results for input(s): CKTOTAL, CKMB, CKMBINDEX, TROPONINI in the last 168 hours. BNP (last 3 results) No results for input(s): PROBNP in the last 8760 hours. CBG:  Recent Labs Lab 08/21/14 0831 08/22/14 0701 08/22/14 0814 08/23/14 0746 08/24/14 0801  GLUCAP 87 82 80 74 74     Recent Results (from the past 240 hour(s))  Culture, blood (routine x 2)     Status: None (Preliminary result)   Collection Time: 08/20/14 11:50 PM  Result Value Ref Range Status   Specimen Description BLOOD RIGHT HAND  Final   Special Requests   Final    BOTTLES DRAWN AEROBIC AND ANAEROBIC RED 6 CC BLUE 10 CC   Culture   Final           BLOOD CULTURE RECEIVED NO GROWTH TO DATE CULTURE WILL BE HELD FOR 5 DAYS BEFORE ISSUING A FINAL NEGATIVE REPORT Performed at [facility]    Report Status PENDING  Incomplete  Culture, blood (routine x 2)     Status: None (Preliminary result)   Collection Time: 08/20/14 11:58 PM  Result Value Ref Range Status   Specimen Description BLOOD LEFT HAND  Final   Special Requests BOTTLES DRAWN AEROBIC ONLY 4 CC  Final   Culture   Final           BLOOD CULTURE RECEIVED NO GROWTH TO DATE CULTURE WILL BE HELD FOR 5 DAYS BEFORE ISSUING A FINAL NEGATIVE REPORT Performed at [facility]    Report Status PENDING  Incomplete  Clostridium Difficile by PCR     Status: Abnormal   Collection Time: 08/21/14  8:22 AM  Result Value Ref Range Status   C difficile by pcr POSITIVE (A) NEGATIVE Final    Comment: CRITICAL RESULT CALLED TO, READ BACK BY AND VERIFIED WITH: ZHU,J RN 08/21/14 1843 WOOTEN,K   Stool culture     Status: None   Collection Time: 08/21/14  8:22 AM  Result Value Ref Range Status   Specimen Description STOOL  Final   Special Requests NONE  Final   Culture   Final    NO SALMONELLA, SHIGELLA, CAMPYLOBACTER, YERSINIA, OR E.COLI 0157:H7 ISOLATED Performed at [facility]    Report Status 08/24/2014 FINAL  Final     Studies: No results found.  Scheduled Meds: . acidophilus  1 capsule Oral Daily  . atorvastatin  10 mg Oral QPM  . heparin  5,000 Units Subcutaneous 3 times per day  . levothyroxine  88 mcg Oral QAC breakfast  . metoprolol  50 mg Oral Daily  . nicotine  21 mg Transdermal Daily  . vancomycin  125 mg Oral  QID   Followed by  . [START ON 09/06/2014] vancomycin  125 mg Oral BID   Followed by  . [START ON 09/14/2014] vancomycin  125 mg Oral Daily   Followed by  . [START ON 09/22/2014] vancomycin  125 mg Oral QODAY   Followed by  . [START ON 09/28/2014] vancomycin  125 mg Oral Q3 days  . venlafaxine XR  150 mg Oral Q breakfast   Continuous Infusions: . sodium chloride 75 mL/hr at 08/23/14 Elmon Kirschner M.D. [facility] 08/24/2014, 12:05 PM  Pager: [phone]

## 2014-08-24 NOTE — Progress Notes (Signed)
Physical Therapy Treatment Patient Details Name: [name] Douglas MRN: [id number] DOB: 02/25/1941 Today's Date: 08/24/2014    History of Present Illness Patient is a 74 yo female admitted 08/20/14 with persistent diarrhea and colitis.  Patient has had 2 other hospitalizations for this issue since 06/19/14.  Patient with weight loss and general weakness.  PMH:  HLD, Hypothyroidism, COPD, HTN, back pain.    PT Comments    Pt continues to refuse ambulating with RW and also insists she will be going home upon D/C. Educated on importance of OOB to/from bathroom with nursing vs BSC. Will cont to follow per POC.   Follow Up Recommendations  Home health PT;Supervision - Intermittent (pt refusing SNF )     Equipment Recommendations  None recommended by PT    Recommendations for Other Services       Precautions / Restrictions Precautions Precautions: Fall Precaution Comments: Patient reports 1 fall in early December.  "Not sure what happened".  Patient became "faint" during gait on evaluation. Restrictions Weight Bearing Restrictions: No    Mobility  Bed Mobility Overal bed mobility: Modified Independent                Transfers Overall transfer level: Needs assistance   Transfers: Sit to/from Stand Sit to Stand: Supervision         General transfer comment: slight sway; no LOB noted   Ambulation/Gait Ambulation/Gait assistance: Min guard Ambulation Distance (Feet): 150 Feet Assistive device: 1 person hand held assist (IV pole intermittently ) Gait Pattern/deviations: Step-through pattern;Decreased stride length;Drifts right/left Gait velocity: decreased Gait velocity interpretation: Below normal speed for age/gender General Gait Details: pt using IV pole to balance; limited due to need to return to restroom; pt continues to refuse ambulating with RW but does report she will use it at home on her "weaker days";  min guard to balance and steady    Stairs             Wheelchair Mobility    Modified Rankin (Stroke Patients Only)       Balance Overall balance assessment: Needs assistance;History of Falls Sitting-balance support: Feet supported;No upper extremity supported Sitting balance-Leahy Scale: Good     Standing balance support: During functional activity;Single extremity supported Standing balance-Leahy Scale: Poor Standing balance comment: bracing with UE support                    Cognition Arousal/Alertness: Awake/alert Behavior During Therapy: WFL for tasks assessed/performed Overall Cognitive Status: Within Functional Limits for tasks assessed                      Exercises      General Comments General comments (skin integrity, edema, etc.): educated on importance of ambulating to/from bathroom  with nursingg to increase activity tolerance       Pertinent Vitals/Pain Pain Assessment: No/denies pain    Home Living                      Prior Function            PT Goals (current goals can now be found in the care plan section) Acute Rehab PT Goals Patient Stated Goal: to not have this going on PT Goal Formulation: With patient/family Time For Goal Achievement: 08/28/14 Potential to Achieve Goals: Good Progress towards PT goals: Progressing toward goals    Frequency  Min 3X/week    PT Plan Current plan remains appropriate  Co-evaluation             End of Session Equipment Utilized During Treatment: Gait belt Activity Tolerance: Patient tolerated treatment well;Patient limited by fatigue Patient left: in chair;with call bell/phone within reach     Time: 0929-0942 PT Time Calculation (min) (ACUTE ONLY): 13 min  Charges:  $Gait Training: 8-22 mins                    G CodesElie Confer Douglas, [place]  [phone] 08/24/2014, 10:17 AM

## 2014-08-24 NOTE — Progress Notes (Signed)
NUTRITION FOLLOW UP  DOCUMENTATION CODES Per approved criteria  -Obesity Unspecified   INTERVENTION: Provide nourishment snack (milk).  Encourage PO intake.--Pt declined oral supplements at this time.  NUTRITION DIAGNOSIS: Inadequate oral intake related to diarrhea as evidenced by pt's report of 21 lbs weight loss in 2 months; ongoing  Goal: Pt to meet >/= 90% of their estimated nutrition needs; not met  Monitor:  Bowel function, weight trend, PO intake, labs  74 y.o. female  Admitting Dx: Diarrhea  ASSESSMENT: 74 y.o. female past medical history of hyperlipidemia, hypothyroidism, COPD, tobacco abuse, who presented with persistent diarrhea.  Pt with ongoing diarrhea. Pt reports this has been ongoing since June 19, 2014. She reports having weight loss with her usual body weight of 206 lbs. Pt with a 11% weight loss in 3 months. Pt reports she has been eating 3 meals a day. Pt is currently on a full liquid diet. Meal completion is 10-25%. Pt was offered oral supplements, however she refused. Pt does report she does favor milk and it does not upset her stomach. RD to order milk as a nourishment snack between meals. Pt was encouraged to eat/drink her food at meals.   Pt with no observed significant fat or muscle mass loss.  Labs: Low calcium, BUN, and GFR.  Height: Ht Readings from Last 1 Encounters:  08/20/14 _0  (1.626 m)    Weight: Wt Readings from Last 1 Encounters:  08/24/14 183 lb 3.2 oz (83.1 kg)    BMI:  Body mass index is 31.43 kg/(m^2).  Re-Estimated Nutritional Needs: Kcal: 1850-2050 Protein: 85-95 grams Fluid: >/=1.8 L/day  Skin: WDL  Diet Order: Diet full liquid   Intake/Output Summary (Last 24 hours) at 08/24/14 1452 Last data filed at 08/24/14 0800  Gross per 24 hour  Intake      0 ml  Output   2450 ml  Net  -2450 ml    Last BM: 1/5- diarrhea  Labs:   Recent Labs Lab 08/21/14 0433 08/22/14 0422 08/23/14 0606  NA 138 134* 136  K  3.4* 3.6 4.1  CL 106 102 104  CO2 _1 BUN 5* 6 <5*  CREATININE 0.62 0.62 0.63  CALCIUM 8.2* 8.0* 8.1*  GLUCOSE 82 54* 79    CBG (last 3)   Recent Labs  08/22/14 0814 08/23/14 0746 08/24/14 0801  GLUCAP 80 74 74    Scheduled Meds: . acidophilus  1 capsule Oral Daily  . atorvastatin  10 mg Oral QPM  . heparin  5,000 Units Subcutaneous 3 times per day  . levothyroxine  88 mcg Oral QAC breakfast  . metoprolol  50 mg Oral Daily  . nicotine  21 mg Transdermal Daily  . vancomycin  125 mg Oral QID   Followed by  . [START ON 09/06/2014] vancomycin  125 mg Oral BID   Followed by  . [START ON 09/14/2014] vancomycin  125 mg Oral Daily   Followed by  . [START ON 09/22/2014] vancomycin  125 mg Oral QODAY   Followed by  . [START ON 09/28/2014] vancomycin  125 mg Oral Q3 days  . venlafaxine XR  150 mg Oral Q breakfast    Continuous Infusions: . sodium chloride 75 mL/hr at 08/23/14 2005    Past Medical History  Diagnosis Date  . COPD (chronic obstructive pulmonary disease)   . Complication of anesthesia     "agitated & restless after knee replacement in 2008"  . Hypertension   . High cholesterol   .  On home oxygen therapy     "2L at night" (08/20/2014)  . Hypothyroidism   . Arthritis     "shoulders" (08/20/2014)  . Chronic lower back pain   . DDD (degenerative disc disease), lumbar   . Depression     Past Surgical History  Procedure Laterality Date  . Tonsillectomy  ~ 1950  . Excisional hemorrhoidectomy  1960's  . Total knee arthroplasty Left 2008  . Joint replacement    . Knee arthroscopy Left X 2 <2008  . Vaginal hysterectomy  1974  . Tubal ligation  1973  . Cardiac catheterization  ~ 2007  . Shoulder open rotator cuff repair Right 2001    Kallie Locks, MS, RD, LDN Pager # [phone] After hours/ weekend pager # [phone]

## 2014-08-25 MED ORDER — BENZONATATE 100 MG PO CAPS
100.0000 mg | ORAL_CAPSULE | Freq: Three times a day (TID) | ORAL | Status: DC
  Administered 2014-08-25 – 2014-08-28 (×10): 100 mg via ORAL
  Filled 2014-08-25 (×12): qty 1

## 2014-08-25 MED ORDER — HYDROCODONE-HOMATROPINE 5-1.5 MG/5ML PO SYRP
5.0000 mL | ORAL_SOLUTION | Freq: Four times a day (QID) | ORAL | Status: DC | PRN
  Administered 2014-08-26 – 2014-08-27 (×3): 5 mL via ORAL
  Filled 2014-08-25 (×3): qty 5

## 2014-08-25 MED ORDER — MENTHOL 3 MG MT LOZG
1.0000 | LOZENGE | OROMUCOSAL | Status: DC | PRN
  Filled 2014-08-25: qty 9

## 2014-08-25 NOTE — Progress Notes (Signed)
[facility] PROGRESS NOTE  [name] Douglas YSA:[id number] DOB: Mar 04, 1941 DOA: 08/20/2014   PCP: Lavera Guise, MD  Brief history of present illness  [name] is 74 year old female with hyperlipidemia, hypothyroidism, COPD, tobacco abuse, presented with persistent diarrhea. Patient was hospitalized due to rectal bleeding 10/31-11/4 in [facility]. She had CT scan which showed ileocolitis. On 08/05/14 vision was tested positive for C. difficile colitis by gastroenterology in [facility]. Patient was started on florastor and vancomycin with no significant improvement. Fecal mirobiodata transplant was performed, vancomycin was discontinued. She was started on lactobacillus and discharged home.  Patient was continuing to have abdominal pain, watery diarrhea with mucus in the stools, because of persistent symptoms, patient presented to [facility] ED for further evaluation.   Assessment/Plan: 1-Recurrent, persistent C diff. - Reports no BM this morning -On October she was treated with cipro and flagyl and prednisone taper for colitis..On 12-07 diagnosed with C diff . Treated with florastor, vancomycin for 7 days , and fecal transplant. She underwent colonoscopy at [facility] which showed findings of ischemic colitis.  -C diff positive this admission, GI pathogen panel negative except C. difficile -GI consulted, currently on acidophilus, vancomycin taper dose per ID recommendations over 6 weeks -CT scan abdomen: mild wall thickening in the cecum and ascending colon.  -ID consulted for treatments recommendation. Appreciate Dr Algis Downs assistance.  - Diarrhea worse after starting solids, continue full liquids for today, advance to soft solids tomorrow  2-Hypokalemia; resolved, recheck BMET   3-Hypothyroidism: Elevated TSH; free T 3 and T4 stable    4-HTN; Continue with metoprolol holder parameters. Hold Norvasc.   5-COPD; PRN nebulizer.   Code Status: Full Code   Family  Communication:   Disposition Plan:   Consultants:  [facility]  Procedures:  none  Antibiotics: Vancomycin  Subjective  Feeling better today, no diarrhea  Objective:  BP 130/40 mmHg  Pulse 97  Temp(Src) 97.8 F (36.6 C) (Oral)  Resp 16  Ht 5\' 4"  (1.626 m)  Wt 84.2 kg (185 lb 10 oz)  BMI 31.85 kg/m2  SpO2 94%   Intake/Output Summary (Last 24 hours) at 08/25/14 1229 Last data filed at 08/25/14 1025  Gross per 24 hour  Intake 2581.5 ml  Output    250 ml  Net 2331.5 ml   Filed Weights   08/23/14 0616 08/24/14 0614 08/25/14 0447  Weight: 83.6 kg (184 lb 4.9 oz) 83.1 kg (183 lb 3.2 oz) 84.2 kg (185 lb 10 oz)    Exam:   General:  Alert and oriented 3, NAD.   CVS: S1 S2 RRR  chest: CTAB  Abdomen: bs present, soft, obese, NT  Ext: no c/c/ edema.   Data Reviewed: Basic Metabolic Panel:  Recent Labs Lab 08/20/14 1603 08/21/14 0433 08/22/14 0422 08/23/14 0606  NA 135 138 134* 136  K 3.7 3.4* 3.6 4.1  CL 100 106 102 104  CO2 25 20 22 26   GLUCOSE 107* 82 54* 79  BUN 8 5* 6 <5*  CREATININE 0.77 0.62 0.62 0.63  CALCIUM 8.7 8.2* 8.0* 8.1*   Liver Function Tests:  Recent Labs Lab 08/20/14 1603  AST 21  ALT 13  ALKPHOS 123*  BILITOT 0.5  PROT 6.0  ALBUMIN 2.8*    Recent Labs Lab 08/20/14 1603  LIPASE 18   No results for input(s): AMMONIA in the last 168 hours. CBC:  Recent Labs Lab 08/20/14 1603 08/21/14 0433 08/22/14 0422 08/23/14 0606  WBC 10.2 6.8 5.9 5.8  NEUTROABS  7.4  --   --   --   HGB 12.1 11.1* 10.7* 10.1*  HCT 36.9 34.0* 32.7* 31.4*  MCV 90.0 90.2 90.3 88.5  PLT 423* 375 357 341   Cardiac Enzymes: No results for input(s): CKTOTAL, CKMB, CKMBINDEX, TROPONINI in the last 168 hours. BNP (last 3 results) No results for input(s): PROBNP in the last 8760 hours. CBG:  Recent Labs Lab 08/21/14 0831 08/22/14 0701 08/22/14 0814 08/23/14 0746 08/24/14 0801  GLUCAP 87 82 80 74 74    Recent Results (from the past 240  hour(s))  Culture, blood (routine x 2)     Status: None (Preliminary result)   Collection Time: 08/20/14 11:50 PM  Result Value Ref Range Status   Specimen Description BLOOD RIGHT HAND  Final   Special Requests   Final    BOTTLES DRAWN AEROBIC AND ANAEROBIC RED 6 CC BLUE 10 CC   Culture   Final           BLOOD CULTURE RECEIVED NO GROWTH TO DATE CULTURE WILL BE HELD FOR 5 DAYS BEFORE ISSUING A FINAL NEGATIVE REPORT Performed at [facility]    Report Status PENDING  Incomplete  Culture, blood (routine x 2)     Status: None (Preliminary result)   Collection Time: 08/20/14 11:58 PM  Result Value Ref Range Status   Specimen Description BLOOD LEFT HAND  Final   Special Requests BOTTLES DRAWN AEROBIC ONLY 4 CC  Final   Culture   Final           BLOOD CULTURE RECEIVED NO GROWTH TO DATE CULTURE WILL BE HELD FOR 5 DAYS BEFORE ISSUING A FINAL NEGATIVE REPORT Performed at [facility]    Report Status PENDING  Incomplete  Clostridium Difficile by PCR     Status: Abnormal   Collection Time: 08/21/14  8:22 AM  Result Value Ref Range Status   C difficile by pcr POSITIVE (A) NEGATIVE Final    Comment: CRITICAL RESULT CALLED TO, READ BACK BY AND VERIFIED WITH: ZHU,J RN 08/21/14 1843 WOOTEN,K   Stool culture     Status: None   Collection Time: 08/21/14  8:22 AM  Result Value Ref Range Status   Specimen Description STOOL  Final   Special Requests NONE  Final   Culture   Final    NO SALMONELLA, SHIGELLA, CAMPYLOBACTER, YERSINIA, OR E.COLI 0157:H7 ISOLATED Performed at [facility]    Report Status 08/24/2014 FINAL  Final  Ova and parasite examination     Status: None   Collection Time: 08/21/14  8:22 AM  Result Value Ref Range Status   Specimen Description STOOL  Final   Special Requests NONE  Final   Ova and parasites   Final    NO OVA OR PARASITES SEEN ABUNDANT YEAST MODERATE WBC Performed at [facility]    Report Status 08/24/2014 FINAL  Final      Studies: No results found.  Scheduled Meds: . acidophilus  1 capsule Oral Daily  . atorvastatin  10 mg Oral QPM  . benzonatate  100 mg Oral TID  . heparin  5,000 Units Subcutaneous 3 times per day  . levothyroxine  88 mcg Oral QAC breakfast  . metoprolol  50 mg Oral Daily  . nicotine  21 mg Transdermal Daily  . vancomycin  125 mg Oral QID   Followed by  . [START ON 09/06/2014] vancomycin  125 mg Oral BID   Followed by  . [START ON  09/14/2014] vancomycin  125 mg Oral Daily   Followed by  . [START ON 09/22/2014] vancomycin  125 mg Oral QODAY   Followed by  . [START ON 09/28/2014] vancomycin  125 mg Oral Q3 days  . venlafaxine XR  150 mg Oral Q breakfast   Continuous Infusions: . sodium chloride 75 mL/hr at 08/25/14 0542    Henrik Orihuela M.D. [facility] 08/25/2014, 12:29 PM  Pager: [phone]

## 2014-08-25 NOTE — Progress Notes (Signed)
[facility] Progress Note  Subjective: Possible slight improvement in diarrhea, not dramatic  Objective: Vital signs in last 24 hours: Temp:  [97.8 F (36.6 C)-99 F (37.2 C)] 97.8 F (36.6 C) (01/06 0447) Pulse Rate:  [88-101] 88 (01/06 0447) Resp:  [16-20] 16 (01/06 0447) BP: (104-132)/(38-48) 129/40 mmHg (01/06 0447) SpO2:  [91 %-94 %] 94 % (01/06 0447) Weight:  [84.2 kg (185 lb 10 oz)] 84.2 kg (185 lb 10 oz) (01/06 0447) Weight change: 1.1 kg (2 lb 6.8 oz)   KD:PTELM, oriented, abd somewhat distended but nontender  Lab Results: No results found for this or any previous visit (from the past 24 hour(s)).  Studies/Results: No results found.   Assess Persistent C. difficile colitis possibly superimposed on initial bout of ischemic colitis  Plan: Continue current aggressive medical therapy aimed at C. difficile. If does not improve within 3 or 4 days or begins having bleeding or other signs of toxicity may need ref the flexible sigmoidoscopy or colonoscopy.    [name] Douglas C 08/25/2014, 10:50 AM

## 2014-08-25 NOTE — Progress Notes (Signed)
Occupational Therapy Treatment Patient Details Name: [name] Douglas MRN: [id number] DOB: 1940/10/02 Today's Date: 08/25/2014    History of present illness Patient is a 74 yo female admitted 08/20/14 with persistent diarrhea and colitis.  Patient has had 2 other hospitalizations for this issue since 06/19/14.  Patient with weight loss and general weakness.  PMH:  HLD, Hypothyroidism, COPD, HTN, back pain.   OT comments  Pt seen today for ADLs and strengthening to promote independence in order to return home. Pt participated in functional mobility around the room and therapeutic exercise. Pt will continue to benefit from acute OT to progress to Mod I level.    Follow Up Recommendations  No OT follow up    Equipment Recommendations  None recommended by OT    Recommendations for Other Services      Precautions / Restrictions Precautions Precautions: Fall Restrictions Weight Bearing Restrictions: No       Mobility Bed Mobility Overal bed mobility: Modified Independent                Transfers Overall transfer level: Needs assistance Equipment used: None Transfers: Sit to/from Stand Sit to Stand: Supervision         General transfer comment: Supervision for safety.         ADL Overall ADL's : Needs assistance/impaired     Grooming: Wash/dry hands;Supervision/safety;Standing               Lower Body Dressing: Supervision/safety;Sit to/from stand   Toilet Transfer: Min guard;Ambulation   Toileting- Clothing Manipulation and Hygiene: Supervision/safety;Sit to/from stand       Functional mobility during ADLs: Min guard General ADL Comments: Pt reports feeling better "just weak." Pt participated in functional mobility around the room and grooming tasks while standing at sink. Pt participated in therapeutic exercises to increase strength due to deconditioning.                 Cognition  Arousal/Alertness: Awake/Alert Behavior During Therapy: WFL for  tasks assessed/performed Overall Cognitive Status: Within Functional Limits for tasks assessed                         Exercises Other Exercises Other Exercises: pt performed seated leg kick with 3 sec hold x10 each leg Other Exercises: pt performed shoulder flexion x10 seated Other Exercises: pt performed sit<>stand x10 with deep breathing to increase activity tolerance and strengthening.  Other Exercises: educated pt on exercises she could perform at home (including above).            Pertinent Vitals/ Pain       Pain Assessment: No/denies pain         Frequency Min 2X/week     Progress Toward Goals  OT Goals(current goals can now be found in the care plan section)  Progress towards OT goals: Progressing toward goals  Acute Rehab OT Goals Patient Stated Goal: to get my strength back OT Goal Formulation: With patient Time For Goal Achievement: 09/06/14 Potential to Achieve Goals: Good  Plan Discharge plan remains appropriate       End of Session Equipment Utilized During Treatment: Gait belt   Activity Tolerance Patient tolerated treatment well   Patient Left in chair;with call bell/phone within reach   Nurse Communication          Time: 1143-1200 OT Time Calculation (min): 17 min  Charges: OT General Charges $OT Visit: 1 Procedure OT Treatments $Therapeutic Activity: 8-22 mins  Villa Herb M 08/25/2014, 1:09 PM   Cyndie Chime, OTR/L Occupational Therapist [phone] (pager)

## 2014-08-26 LAB — BASIC METABOLIC PANEL
Anion gap: 6 (ref 5–15)
BUN: <5 mg/dL — ABNORMAL LOW (ref 6–23)
CO2: 29 mmol/L (ref 19–32)
Calcium: 8.1 mg/dL — ABNORMAL LOW (ref 8.4–10.5)
Chloride: 102 mEq/L (ref 96–112)
Creatinine, Ser: 0.56 mg/dL (ref 0.50–1.10)
GFR calc Af Amer: >90 mL/min (ref >90)
GFR calc non Af Amer: 90 mL/min — ABNORMAL LOW (ref >90)
Glucose, Bld: 84 mg/dL (ref 70–99)
Potassium: 3.4 mmol/L — ABNORMAL LOW (ref 3.5–5.1)
Sodium: 137 mmol/L (ref 135–145)

## 2014-08-26 LAB — CBC
HCT: 33.2 % — ABNORMAL LOW (ref 36.0–46.0)
Hemoglobin: 10.6 g/dL — ABNORMAL LOW (ref 12.0–15.0)
MCH: 27.9 pg (ref 26.0–34.0)
MCHC: 31.9 g/dL (ref 30.0–36.0)
MCV: 87.4 fL (ref 78.0–100.0)
Platelets: 348 10*3/uL (ref 150–400)
RBC: 3.8 MIL/uL — ABNORMAL LOW (ref 3.87–5.11)
RDW: 13.8 % (ref 11.5–15.5)
WBC: 5.1 10*3/uL (ref 4.0–10.5)

## 2014-08-26 LAB — GLUCOSE, CAPILLARY: Glucose-Capillary: 77 mg/dL (ref 70–99)

## 2014-08-26 MED ORDER — POTASSIUM CHLORIDE CRYS ER 20 MEQ PO TBCR
40.0000 meq | EXTENDED_RELEASE_TABLET | Freq: Once | ORAL | Status: AC
  Administered 2014-08-26: 40 meq via ORAL
  Filled 2014-08-26: qty 2

## 2014-08-26 NOTE — Progress Notes (Signed)
[facility] Progress Note  Subjective: She had 4 bowel movements (diarrhea)today. No abdominal pain. Otherwise she feels fine.  Objective: Vital signs in last 24 hours: Temp:  [98.4 F (36.9 C)-98.5 F (36.9 C)] 98.5 F (36.9 C) (01/07 1355) Pulse Rate:  [84-89] 84 (01/07 0951) Resp:  [16-20] 20 (01/07 1355) BP: (110-125)/(36-82) 117/41 mmHg (01/07 1355) SpO2:  [94 %-95 %] 95 % (01/07 1355) Weight:  [85 kg (187 lb 6.3 oz)] 85 kg (187 lb 6.3 oz) (01/07 0538) Weight change: 0.8 kg (1 lb 12.2 oz)   PE: No distress. Abdomen soft and non tender  Lab Results: Results for orders placed or performed during the hospital encounter of 08/20/14 (from the past 24 hour(s))  CBC     Status: Abnormal   Collection Time: 08/26/14  8:12 AM  Result Value Ref Range   WBC 5.1 4.0 - 10.5 K/uL   RBC 3.80 (L) 3.87 - 5.11 MIL/uL   Hemoglobin 10.6 (L) 12.0 - 15.0 g/dL   HCT 33.2 (L) 36.0 - 46.0 %   MCV 87.4 78.0 - 100.0 fL   MCH 27.9 26.0 - 34.0 pg   MCHC 31.9 30.0 - 36.0 g/dL   RDW 13.8 11.5 - 15.5 %   Platelets 348 150 - 400 K/uL  Basic metabolic panel     Status: Abnormal   Collection Time: 08/26/14  8:12 AM  Result Value Ref Range   Sodium 137 135 - 145 mmol/L   Potassium 3.4 (L) 3.5 - 5.1 mmol/L   Chloride 102 96 - 112 mEq/L   CO2 29 19 - 32 mmol/L   Glucose, Bld 84 70 - 99 mg/dL   BUN <5 (L) 6 - 23 mg/dL   Creatinine, Ser 0.56 0.50 - 1.10 mg/dL   Calcium 8.1 (L) 8.4 - 10.5 mg/dL   GFR calc non Af Amer 90 (L) >90 mL/min   GFR calc Af Amer >90 >90 mL/min   Anion gap 6 5 - 15  Glucose, capillary     Status: None   Collection Time: 08/26/14  8:50 AM  Result Value Ref Range   Glucose-Capillary 77 70 - 99 mg/dL    Studies/Results: No results found.    Assessment: Diarrhea secondary to C dif. Has had prolonged treatment including fecal transplant.  Remains on Vancomycin  Plan: Continue Vancomycin. As per ID taper dose over 6 weeks once C dif toxin is  cleared.    Wonda Horner 08/26/2014, 3:44 PM

## 2014-08-26 NOTE — Progress Notes (Signed)
[facility] PROGRESS NOTE  [name] Douglas IEP:[id number] DOB: Aug 16, 1941 DOA: 08/20/2014   PCP: Lavera Guise, MD  Brief history of present illness  [name] is 74 year old female with hyperlipidemia, hypothyroidism, COPD, tobacco abuse, presented with persistent diarrhea. Patient was hospitalized due to rectal bleeding 10/31-11/4 in [facility]. She had CT scan which showed ileocolitis. On 08/05/14 vision was tested positive for C. difficile colitis by gastroenterology in [facility]. Patient was started on florastor and vancomycin with no significant improvement. Fecal mirobiodata transplant was performed, vancomycin was discontinued. She was started on lactobacillus and discharged home.  Patient was continuing to have abdominal pain, watery diarrhea with mucus in the stools, because of persistent symptoms, patient presented to [facility] ED for further evaluation.   Assessment/Plan: 1-Recurrent, persistent C diff. - In October she was treated with cipro and flagyl and prednisone taper for colitis..On 12-07 diagnosed with C diff . Treated with florastor, vancomycin for 7 days and fecal transplant. She underwent colonoscopy at [facility] which showed findings of ischemic colitis.  -C diff positive this admission, GI pathogen panel negative except C. difficile -GI consulted, currently on acidophilus, vancomycin taper dose per ID recommendations over 6 weeks -CT scan abdomen: mild wall thickening in the cecum and ascending colon.  -ID consulted for treatments recommendation. Appreciate Dr Algis Downs assistance.  - 1 watery BM today, however patient is okay with trying soft solids. Per gastroenterology if does not improve within next 3-4 days or begins having bleeding or any other signs of toxicity, may need flexible sigmoidoscopy or colonoscopy.  2-Hypokalemia; - Replaced   3-Hypothyroidism: Elevated TSH; free T 3 and T4 stable    4-HTN; Continue with metoprolol holder parameters.  Hold Norvasc.   5-COPD; PRN nebulizer.   Code Status: Full Code   Family Communication:   Disposition Plan: Once cleared by gastroenterology  Consultants:  [facility]  Procedures:  none  Antibiotics: Vancomycin  Subjective  One episode of watery BM today, no abdominal tenderness, fevers or chills, wants to try soft solids  Objective:  BP 122/82 mmHg  Pulse 84  Temp(Src) 98.4 F (36.9 C) (Oral)  Resp 16  Ht 5\' 4"  (1.626 m)  Wt 85 kg (187 lb 6.3 oz)  BMI 32.15 kg/m2  SpO2 94%   Intake/Output Summary (Last 24 hours) at 08/26/14 1156 Last data filed at 08/26/14 1100  Gross per 24 hour  Intake 1024.5 ml  Output      0 ml  Net 1024.5 ml   Filed Weights   08/24/14 0614 08/25/14 0447 08/26/14 0538  Weight: 83.1 kg (183 lb 3.2 oz) 84.2 kg (185 lb 10 oz) 85 kg (187 lb 6.3 oz)    Exam:   General:  Alert and oriented 3, NAD.   CVS: S1 S2 RRR  chest: CTAB  Abdomen: NBS, soft, obese, NT  Ext: no c/c/ edema.   Data Reviewed: Basic Metabolic Panel:  Recent Labs Lab 08/20/14 1603 08/21/14 0433 08/22/14 0422 08/23/14 0606 08/26/14 0812  NA 135 138 134* 136 137  K 3.7 3.4* 3.6 4.1 3.4*  CL 100 106 102 104 102  CO2 25 20 22 26 29   GLUCOSE 107* 82 54* 79 84  BUN 8 5* 6 <5* <5*  CREATININE 0.77 0.62 0.62 0.63 0.56  CALCIUM 8.7 8.2* 8.0* 8.1* 8.1*   Liver Function Tests:  Recent Labs Lab 08/20/14 1603  AST 21  ALT 13  ALKPHOS 123*  BILITOT 0.5  PROT 6.0  ALBUMIN 2.8*  Recent Labs Lab 08/20/14 1603  LIPASE 18   No results for input(s): AMMONIA in the last 168 hours. CBC:  Recent Labs Lab 08/20/14 1603 08/21/14 0433 08/22/14 0422 08/23/14 0606 08/26/14 0812  WBC 10.2 6.8 5.9 5.8 5.1  NEUTROABS 7.4  --   --   --   --   HGB 12.1 11.1* 10.7* 10.1* 10.6*  HCT 36.9 34.0* 32.7* 31.4* 33.2*  MCV 90.0 90.2 90.3 88.5 87.4  PLT 423* 375 357 341 348   Cardiac Enzymes: No results for input(s): CKTOTAL, CKMB, CKMBINDEX, TROPONINI in the  last 168 hours. BNP (last 3 results) No results for input(s): PROBNP in the last 8760 hours. CBG:  Recent Labs Lab 08/22/14 0701 08/22/14 0814 08/23/14 0746 08/24/14 0801 08/26/14 0850  GLUCAP 82 80 74 74 77    Recent Results (from the past 240 hour(s))  Culture, blood (routine x 2)     Status: None (Preliminary result)   Collection Time: 08/20/14 11:50 PM  Result Value Ref Range Status   Specimen Description BLOOD RIGHT HAND  Final   Special Requests   Final    BOTTLES DRAWN AEROBIC AND ANAEROBIC RED 6 CC BLUE 10 CC   Culture   Final           BLOOD CULTURE RECEIVED NO GROWTH TO DATE CULTURE WILL BE HELD FOR 5 DAYS BEFORE ISSUING A FINAL NEGATIVE REPORT Performed at [facility]    Report Status PENDING  Incomplete  Culture, blood (routine x 2)     Status: None (Preliminary result)   Collection Time: 08/20/14 11:58 PM  Result Value Ref Range Status   Specimen Description BLOOD LEFT HAND  Final   Special Requests BOTTLES DRAWN AEROBIC ONLY 4 CC  Final   Culture   Final           BLOOD CULTURE RECEIVED NO GROWTH TO DATE CULTURE WILL BE HELD FOR 5 DAYS BEFORE ISSUING A FINAL NEGATIVE REPORT Performed at [facility]    Report Status PENDING  Incomplete  Clostridium Difficile by PCR     Status: Abnormal   Collection Time: 08/21/14  8:22 AM  Result Value Ref Range Status   C difficile by pcr POSITIVE (A) NEGATIVE Final    Comment: CRITICAL RESULT CALLED TO, READ BACK BY AND VERIFIED WITH: ZHU,J RN 08/21/14 1843 WOOTEN,K   Stool culture     Status: None   Collection Time: 08/21/14  8:22 AM  Result Value Ref Range Status   Specimen Description STOOL  Final   Special Requests NONE  Final   Culture   Final    NO SALMONELLA, SHIGELLA, CAMPYLOBACTER, YERSINIA, OR E.COLI 0157:H7 ISOLATED Performed at [facility]    Report Status 08/24/2014 FINAL  Final  Ova and parasite examination     Status: None   Collection Time: 08/21/14  8:22 AM  Result Value  Ref Range Status   Specimen Description STOOL  Final   Special Requests NONE  Final   Ova and parasites   Final    NO OVA OR PARASITES SEEN ABUNDANT YEAST MODERATE WBC Performed at [facility]    Report Status 08/24/2014 FINAL  Final     Studies: No results found.  Scheduled Meds: . acidophilus  1 capsule Oral Daily  . atorvastatin  10 mg Oral QPM  . benzonatate  100 mg Oral TID  . heparin  5,000 Units Subcutaneous 3 times per day  . levothyroxine  88  mcg Oral QAC breakfast  . metoprolol  50 mg Oral Daily  . nicotine  21 mg Transdermal Daily  . potassium chloride  40 mEq Oral Once  . vancomycin  125 mg Oral QID   Followed by  . [START ON 09/06/2014] vancomycin  125 mg Oral BID   Followed by  . [START ON 09/14/2014] vancomycin  125 mg Oral Daily   Followed by  . [START ON 09/22/2014] vancomycin  125 mg Oral QODAY   Followed by  . [START ON 09/28/2014] vancomycin  125 mg Oral Q3 days  . venlafaxine XR  150 mg Oral Q breakfast   Continuous Infusions: . sodium chloride 75 mL/hr at 08/26/14 0746    Daison Braxton M.D. [facility] 08/26/2014, 11:56 AM  Pager: [phone]

## 2014-08-27 LAB — CULTURE, BLOOD (ROUTINE X 2)
Culture: NO GROWTH
Culture: NO GROWTH

## 2014-08-27 LAB — POTASSIUM: Potassium: 4.8 mmol/L (ref 3.5–5.1)

## 2014-08-27 LAB — GLUCOSE, CAPILLARY: Glucose-Capillary: 97 mg/dL (ref 70–99)

## 2014-08-27 LAB — CLOSTRIDIUM DIFFICILE BY PCR: Toxigenic C. Difficile by PCR: POSITIVE — AB

## 2014-08-27 MED ORDER — POTASSIUM CHLORIDE CRYS ER 20 MEQ PO TBCR
40.0000 meq | EXTENDED_RELEASE_TABLET | Freq: Once | ORAL | Status: AC
  Administered 2014-08-27: 40 meq via ORAL
  Filled 2014-08-27: qty 2

## 2014-08-27 NOTE — Progress Notes (Signed)
[facility] PROGRESS NOTE  [name] Douglas:[id number] DOB: [birth year] DOA: 08/20/2014   PCP: Lavera Guise, MD  Brief history of present illness  [name] is 74 year old female with hyperlipidemia, hypothyroidism, COPD, tobacco abuse, presented with persistent diarrhea. Patient was hospitalized due to rectal bleeding 10/31-11/4 in [facility]. She had CT scan which showed ileocolitis. On 08/05/14 vision was tested positive for C. difficile colitis by gastroenterology in [facility]. Patient was started on florastor and vancomycin with no significant improvement. Fecal mirobiodata transplant was performed, vancomycin was discontinued. She was started on lactobacillus and discharged home.  Patient was continuing to have abdominal pain, watery diarrhea with mucus in the stools, because of persistent symptoms, patient presented to [facility] ED for further evaluation.   Assessment/Plan: 1-Recurrent, persistent C diff. - In October she was treated with cipro and flagyl and prednisone taper for colitis..On 12-07 diagnosed with C diff . Treated with florastor, vancomycin for 7 days and fecal transplant. She underwent colonoscopy at [facility] which showed findings of ischemic colitis.  -C diff positive this admission, GI pathogen panel negative except C. difficile -GI consulted, currently on acidophilus, vancomycin taper dose per ID recommendations over 6 weeks -CT scan abdomen: mild wall thickening in the cecum and ascending colon.  - Continues to have diarrhea, GI following, planning flexible sigmoidoscopy or colonoscopy if continues to have persistent diarrhea  2-Hypokalemia; - Replaced yesterday, recheck K  3-Hypothyroidism: Elevated TSH; free T 3 and T4 stable    4-HTN; Continue with metoprolol holder parameters.   5-COPD; PRN nebulizer.   Code Status: Full Code   Family Communication:   Disposition Plan: Once cleared by gastroenterology  Consultants:  [facility]  Procedures:  none  Antibiotics: Vancomycin  Subjective  Several watery bowel movements yesterday  Objective:  BP 140/52 mmHg  Pulse 98  Temp(Src) 98.4 F (36.9 C) (Oral)  Resp 18  Ht 5\' 4"  (1.626 m)  Wt 84.5 kg (186 lb 4.6 oz)  BMI 31.96 kg/m2  SpO2 95%   Intake/Output Summary (Last 24 hours) at 08/27/14 1207 Last data filed at 08/27/14 1041  Gross per 24 hour  Intake   3163 ml  Output   1900 ml  Net   1263 ml   Filed Weights   08/25/14 0447 08/26/14 0538 08/27/14 0508  Weight: 84.2 kg (185 lb 10 oz) 85 kg (187 lb 6.3 oz) 84.5 kg (186 lb 4.6 oz)    Exam:   General:  Alert and oriented 3, NAD.   CVS: S1 S2 RRR  chest: CTAB  Abdomen: NBS, soft, obese, NT  Ext: no c/c/ edema.   Data Reviewed: Basic Metabolic Panel:  Recent Labs Lab 08/20/14 1603 08/21/14 0433 08/22/14 0422 08/23/14 0606 08/26/14 0812  NA 135 138 134* 136 137  K 3.7 3.4* 3.6 4.1 3.4*  CL 100 106 102 104 102  CO2 25 20 22 26 29   GLUCOSE 107* 82 54* 79 84  BUN 8 5* 6 <5* <5*  CREATININE 0.77 0.62 0.62 0.63 0.56  CALCIUM 8.7 8.2* 8.0* 8.1* 8.1*   Liver Function Tests:  Recent Labs Lab 08/20/14 1603  AST 21  ALT 13  ALKPHOS 123*  BILITOT 0.5  PROT 6.0  ALBUMIN 2.8*    Recent Labs Lab 08/20/14 1603  LIPASE 18   No results for input(s): AMMONIA in the last 168 hours. CBC:  Recent Labs Lab 08/20/14 1603 08/21/14 0433 08/22/14 0422 08/23/14 0606 08/26/14 0812  WBC 10.2 6.8 5.9 5.8  5.1  NEUTROABS 7.4  --   --   --   --   HGB 12.1 11.1* 10.7* 10.1* 10.6*  HCT 36.9 34.0* 32.7* 31.4* 33.2*  MCV 90.0 90.2 90.3 88.5 87.4  PLT 423* 375 357 341 348   Cardiac Enzymes: No results for input(s): CKTOTAL, CKMB, CKMBINDEX, TROPONINI in the last 168 hours. BNP (last 3 results) No results for input(s): PROBNP in the last 8760 hours. CBG:  Recent Labs Lab 08/22/14 0814 08/23/14 0746 08/24/14 0801 08/26/14 0850 08/27/14 0748  GLUCAP 80 74 74 77 97     Recent Results (from the past 240 hour(s))  Culture, blood (routine x 2)     Status: None   Collection Time: 08/20/14 11:50 PM  Result Value Ref Range Status   Specimen Description BLOOD RIGHT HAND  Final   Special Requests   Final    BOTTLES DRAWN AEROBIC AND ANAEROBIC RED 6 CC BLUE 10 CC   Culture   Final    NO GROWTH 5 DAYS Performed at [facility]    Report Status 08/27/2014 FINAL  Final  Culture, blood (routine x 2)     Status: None   Collection Time: 08/20/14 11:58 PM  Result Value Ref Range Status   Specimen Description BLOOD LEFT HAND  Final   Special Requests BOTTLES DRAWN AEROBIC ONLY 4 CC  Final   Culture   Final    NO GROWTH 5 DAYS Performed at [facility]    Report Status 08/27/2014 FINAL  Final  Clostridium Difficile by PCR     Status: Abnormal   Collection Time: 08/21/14  8:22 AM  Result Value Ref Range Status   C difficile by pcr POSITIVE (A) NEGATIVE Final    Comment: CRITICAL RESULT CALLED TO, READ BACK BY AND VERIFIED WITH: ZHU,J RN 08/21/14 1843 WOOTEN,K   Stool culture     Status: None   Collection Time: 08/21/14  8:22 AM  Result Value Ref Range Status   Specimen Description STOOL  Final   Special Requests NONE  Final   Culture   Final    NO SALMONELLA, SHIGELLA, CAMPYLOBACTER, YERSINIA, OR E.COLI 0157:H7 ISOLATED Performed at [facility]    Report Status 08/24/2014 FINAL  Final  Ova and parasite examination     Status: None   Collection Time: 08/21/14  8:22 AM  Result Value Ref Range Status   Specimen Description STOOL  Final   Special Requests NONE  Final   Ova and parasites   Final    NO OVA OR PARASITES SEEN ABUNDANT YEAST MODERATE WBC Performed at [facility]    Report Status 08/24/2014 FINAL  Final     Studies: No results found.  Scheduled Meds: . acidophilus  1 capsule Oral Daily  . atorvastatin  10 mg Oral QPM  . benzonatate  100 mg Oral TID  . heparin  5,000 Units Subcutaneous 3 times per  day  . levothyroxine  88 mcg Oral QAC breakfast  . metoprolol  50 mg Oral Daily  . nicotine  21 mg Transdermal Daily  . vancomycin  125 mg Oral QID   Followed by  . [START ON 09/06/2014] vancomycin  125 mg Oral BID   Followed by  . [START ON 09/14/2014] vancomycin  125 mg Oral Daily   Followed by  . [START ON 09/22/2014] vancomycin  125 mg Oral QODAY   Followed by  . [START ON 09/28/2014] vancomycin  125 mg Oral Q3 days  .  venlafaxine XR  150 mg Oral Q breakfast   Continuous Infusions: . sodium chloride 75 mL/hr at 08/27/14 4859    Mindie Rawdon M.D. [facility] 08/27/2014, 12:07 PM  Pager: [phone]

## 2014-08-27 NOTE — Progress Notes (Signed)
Occupational Therapy Treatment Patient Details Name: LEILAH POLIMENI MRN: [id number] DOB: 1941-07-29 Today's Date: 08/27/2014    History of present illness Patient is a 74 yo female admitted 08/20/14 with persistent diarrhea and colitis.  Patient has had 2 other hospitalizations for this issue since 06/19/14.  Patient with weight loss and general weakness.  PMH:  HLD, Hypothyroidism, COPD, HTN, back pain.   OT comments  Pt seen today for ADLs and therapeutic exercise. Pt reports feeling stronger today and participated fully in therapeutic exercise and ADLs in room. Pt will continue to benefit from acute OT for strengthening and safety with ADLs prior to return home.    Follow Up Recommendations  No OT follow up    Equipment Recommendations  None recommended by OT    Recommendations for Other Services      Precautions / Restrictions Precautions Precautions: Fall Restrictions Weight Bearing Restrictions: No       Mobility Bed Mobility Overal bed mobility: Modified Independent                Transfers Overall transfer level: Needs assistance Equipment used: None Transfers: Sit to/from Stand Sit to Stand: Supervision         General transfer comment: Supervision for safety.         ADL Overall ADL's : Needs assistance/impaired     Grooming: Wash/dry hands;Supervision/safety;Standing               Lower Body Dressing: Sit to/from stand;Modified independent   Toilet Transfer: Supervision/safety;Ambulation   Toileting- Clothing Manipulation and Hygiene: Modified independent;Sit to/from stand       Functional mobility during ADLs: Supervision/safety General ADL Comments: Pt reports feeling stronger today and was able to eat a solid breakfast. Pt reports 1 episode of diarrhea today. Pt participated in ADL session and therapeutic exercises.                 Cognition  Arousal/Alertness: Awake/Alert Behavior During Therapy: WFL for tasks  assessed/performed Overall Cognitive Status: Within Functional Limits for tasks assessed                         Exercises Other Exercises Other Exercises: pt performed seated leg kick with 3 sec hold x10 each leg Other Exercises: pt performed shoulder flexion x10 seated Other Exercises: pt performed sit<>stand x10 with deep breathing to increase activity tolerance and strengthening.            Pertinent Vitals/ Pain       Pain Assessment: No/denies pain         Frequency Min 2X/week     Progress Toward Goals  OT Goals(current goals can now be found in the care plan section)  Progress towards OT goals: Progressing toward goals  Acute Rehab OT Goals Patient Stated Goal: to get my strength back OT Goal Formulation: With patient Time For Goal Achievement: 09/06/14 Potential to Achieve Goals: Good  Plan Discharge plan remains appropriate       End of Session Equipment Utilized During Treatment: Gait belt   Activity Tolerance Patient tolerated treatment well   Patient Left in chair;with call bell/phone within reach   Nurse Communication          Time: 1133-1200 OT Time Calculation (min): 27 min  Charges: OT General Charges $OT Visit: 1 Procedure OT Treatments $Self Care/Home Management : 8-22 mins $Therapeutic Exercise: 8-22 mins  Villa Herb M 08/27/2014, 1:26 PM  Cyndie Chime, OTR/L  Occupational Therapist [phone] (pager)

## 2014-08-27 NOTE — Progress Notes (Signed)
Chaplain responded to page for pt needing support. Pt reports that she is feeling better today. Chaplain informed pt of her services should she need them. Chaplain offered prayer, page chaplain as needed.    08/27/14 1400  Clinical Encounter Type  Visited With Patient  Visit Type Initial;Spiritual support  Referral From Nurse  Spiritual Encounters  Spiritual Needs Prayer  Stress Factors  Patient Stress Factors None identified  Yari Szeliga, Barbette Hair, Chaplain 08/27/2014 2:22 PM

## 2014-08-27 NOTE — Progress Notes (Signed)
[facility] Progress Note  Subjective: She is still experiencing diarrhea. She denies abdominal pain.  Objective: Vital signs in last 24 hours: Temp:  [98.4 F (36.9 C)-98.5 F (36.9 C)] 98.4 F (36.9 C) (01/08 0508) Pulse Rate:  [84-86] 86 (01/08 0508) Resp:  [18-20] 18 (01/08 0508) BP: (117-135)/(41-82) 135/43 mmHg (01/08 0508) SpO2:  [95 %-96 %] 95 % (01/08 0508) Weight:  [84.5 kg (186 lb 4.6 oz)] 84.5 kg (186 lb 4.6 oz) (01/08 0508) Weight change: -0.5 kg (-1 lb 1.6 oz)   PE:  Abdomen is soft and nontender  Lab Results: Results for orders placed or performed during the hospital encounter of 08/20/14 (from the past 24 hour(s))  Glucose, capillary     Status: None   Collection Time: 08/27/14  7:48 AM  Result Value Ref Range   Glucose-Capillary 97 70 - 99 mg/dL    Studies/Results: No results found.    Assessment: C. difficile colitis.  Plan: Continue vancomycin.    Aldyn Toon F 08/27/2014, 9:50 AM

## 2014-08-28 LAB — BASIC METABOLIC PANEL
Anion gap: 5 (ref 5–15)
BUN: <5 mg/dL — ABNORMAL LOW (ref 6–23)
CO2: 29 mmol/L (ref 19–32)
Calcium: 8.1 mg/dL — ABNORMAL LOW (ref 8.4–10.5)
Chloride: 103 mEq/L (ref 96–112)
Creatinine, Ser: 0.48 mg/dL — ABNORMAL LOW (ref 0.50–1.10)
GFR calc Af Amer: >90 mL/min (ref >90)
GFR calc non Af Amer: >90 mL/min (ref >90)
Glucose, Bld: 93 mg/dL (ref 70–99)
Potassium: 3.7 mmol/L (ref 3.5–5.1)
Sodium: 137 mmol/L (ref 135–145)

## 2014-08-28 LAB — GLUCOSE, CAPILLARY: Glucose-Capillary: 90 mg/dL (ref 70–99)

## 2014-08-28 MED ORDER — ALBUTEROL SULFATE (2.5 MG/3ML) 0.083% IN NEBU: 2.5000 mg | INHALATION_SOLUTION | Freq: Two times a day (BID) | RESPIRATORY_TRACT | Status: DC

## 2014-08-28 MED ORDER — BENZONATATE 100 MG PO CAPS
200.0000 mg | ORAL_CAPSULE | Freq: Three times a day (TID) | ORAL | Status: DC
  Filled 2014-08-28 (×2): qty 2

## 2014-08-28 MED ORDER — ENSURE COMPLETE PO LIQD
237.0000 mL | Freq: Two times a day (BID) | ORAL | Status: DC
  Administered 2014-08-28: 237 mL via ORAL

## 2014-08-28 MED ORDER — HYDROCODONE-HOMATROPINE 5-1.5 MG/5ML PO SYRP: 5.0000 mL | ORAL_SOLUTION | ORAL | Status: DC | PRN

## 2014-08-28 MED ORDER — VANCOMYCIN 50 MG/ML ORAL SOLUTION: 125.0000 mg | Freq: Four times a day (QID) | ORAL | Status: DC

## 2014-08-28 MED ORDER — NICOTINE 21 MG/24HR TD PT24: 21.0000 mg | MEDICATED_PATCH | Freq: Every day | TRANSDERMAL | Status: DC

## 2014-08-28 MED ORDER — IPRATROPIUM-ALBUTEROL 0.5-2.5 (3) MG/3ML IN SOLN
3.0000 mL | RESPIRATORY_TRACT | Status: DC
  Administered 2014-08-28: 3 mL via RESPIRATORY_TRACT
  Filled 2014-08-28: qty 3

## 2014-08-28 MED ORDER — BENZONATATE 200 MG PO CAPS: 200.0000 mg | ORAL_CAPSULE | Freq: Three times a day (TID) | ORAL | Status: DC

## 2014-08-28 MED ORDER — ALBUTEROL SULFATE HFA 108 (90 BASE) MCG/ACT IN AERS: 2.0000 | INHALATION_SPRAY | Freq: Four times a day (QID) | RESPIRATORY_TRACT | Status: DC | PRN

## 2014-08-28 MED ORDER — ENSURE COMPLETE PO LIQD: 237.0000 mL | Freq: Two times a day (BID) | ORAL | Status: DC

## 2014-08-28 MED ORDER — TIOTROPIUM BROMIDE MONOHYDRATE 18 MCG IN CAPS: 18.0000 ug | ORAL_CAPSULE | Freq: Every day | RESPIRATORY_TRACT | Status: DC

## 2014-08-28 NOTE — Progress Notes (Signed)
[name] Douglas 9:12 AM  Subjective: Patient doing much better from a GI standpoint with only 1 bowel movement so far today and only one diarrhea episode yesterday and no new complaints  Objective: Vital signs stable afebrile no acute distress abdomen is soft nontender  Assessment: C. difficile  Plan: Patient will need a long-term just very slow Vanco taper and please call us if we can be of any further assistance this hospital stay and one of [place] is happy see back when necessary  [facility] E

## 2014-08-28 NOTE — Discharge Summary (Signed)
Physician Discharge Summary  Patient ID: [name] Douglas MRN: [id number] DOB/AGE: 10/02/1940 74 y.o.  Admit date: 08/20/2014 Discharge date: 08/28/2014  Primary Care Physician:  Lavera Guise, MD  Discharge Diagnoses:    . severe recurrent C. difficile colitis  . HTN (hypertension) . HLD (hyperlipidemia) . Hypothyroidism . Tobacco abuse . COPD (chronic obstructive pulmonary disease) . Back pain  Consults: Gastroenterology, Dr. Penelope Coop   Recommendations for Outpatient Follow-up:  Patient needs to follow up with her primary gastroenterologist at [facility]. Please refer her to ID specialist at [facility] for close follow-up. Regarding her recurrent C. difficile colitis.  DIET: Soft diet    Allergies:  No Known Allergies   Discharge Medications:   Medication List    STOP taking these medications        amLODipine 10 MG tablet  Commonly known as:  NORVASC      TAKE these medications        ACIDOPHILUS PO  Take by mouth.     albuterol 108 (90 BASE) MCG/ACT inhaler  Commonly known as:  PROVENTIL HFA;VENTOLIN HFA  Inhale 2 puffs into the lungs every 6 (six) hours as needed for wheezing or shortness of breath.     albuterol (2.5 MG/3ML) 0.083% nebulizer solution  Commonly known as:  PROVENTIL  Take 3 mLs (2.5 mg total) by nebulization 2 (two) times daily.     atorvastatin 10 MG tablet  Commonly known as:  LIPITOR  Take 10 mg by mouth every evening.     benzonatate 200 MG capsule  Commonly known as:  TESSALON  Take 1 capsule (200 mg total) by mouth 3 (three) times daily. For cough     feeding supplement (ENSURE COMPLETE) Liqd  Take 237 mLs by mouth 2 (two) times daily between meals. Over the counter     HYDROcodone-homatropine 5-1.5 MG/5ML syrup  Commonly known as:  HYCODAN  Take 5 mLs by mouth every 4 (four) hours as needed for cough.     levothyroxine 88 MCG tablet  Commonly known as:  SYNTHROID, LEVOTHROID  Take 88 mcg by mouth daily before breakfast.      metoprolol 50 MG tablet  Commonly known as:  LOPRESSOR  Take 50 mg by mouth daily.     nicotine 21 mg/24hr patch  Commonly known as:  NICODERM CQ - dosed in mg/24 hours  Place 1 patch (21 mg total) onto the skin daily.     tiotropium 18 MCG inhalation capsule  Commonly known as:  SPIRIVA HANDIHALER  Place 1 capsule (18 mcg total) into inhaler and inhale daily.     vancomycin 50 mg/mL oral solution  Commonly known as:  VANCOCIN  - Take 2.5 mLs (125 mg total) by mouth 4 (four) times daily. Please take Vancomycin taper as outlined below:  -  Start oral vancomycin 125MG  4 times a day x 14days,  - then taper to 125mg  twice a day  X 7days,  - then 125mg  daily for 7 days,  - then 125 mg every other day x 7days,   - then 125mg  every 3 days x 14days, then OFF.     venlafaxine XR 150 MG 24 hr capsule  Commonly known as:  EFFEXOR-XR  Take 150 mg by mouth daily with breakfast. Also take a 75mg  tablet per daughter         Brief H and P: For complete details please refer to admission H and P, but in brief, [name] Douglas is a 74 y.o.  female past medical history of attention, hyperlipidemia, hypothyroidism, COPD, tobacco abuse, who presented with persistent diarrhea. Patient reported that she was hospitalized due to rectal bleeding from 06/19/2014 through 06/23/2014 in [place]. She had CT scan which showed Ileocolitis. She was treated with prednisone, ciprofloxacin and Flagyl, but without significant improvement. She still had abdominal pain, watery diarrhea, with mucus in stools. On 07/26/14, she was tested positive for C. difficile colitis by GI in [place] hospital. She was started with Florastar and vancomycin, but still no improvement. Because of persistent symptoms, she was hospitalized again on 12/18. She had colonoscopy which showed diffuse severe inflammation in sigmoid colon and in descending colon secondary to ischemic colitis. Fecal transplant was performed. Vancomycin  was discontinued, and she was started with lactobacillus and discharged home. The patient was not taking any antibiotics at the time of admission. She still had abdominal pain, watery diarrhea with mucus in the stools. Patient felt very weak. Because of persistent symptoms, patient presented to the hospital for further evaluation and treatment.   Hospital Course:   1-Recurrent, persistent C diff. - In October she was treated with cipro and flagyl and prednisone taper for colitis.On 12-07 diagnosed with C diff. She was also treated with florastor, vancomycin for 7 days and fecal transplant. She underwent colonoscopy at [facility] which showed findings of ischemic colitis.  Stool studies during this admission showed positive C. difficile. GI pathogen panel was negative except for the positive C. Difficile. CT scan of the abdomen showed mild wall thickening in the cecum and ascending colon. Infectious disease and gastroenterology was consulted. Patient was placed on acidophilus, vancomycin taper dose per ID recommendations over 6 weeks.  She will follow-up with her gastroenterologist at [facility], should also follow with infectious disease at [facility] for close follow-up of recurrent C. difficile colitis.  2-Hypokalemia; resolved.  3-Hypothyroidism: Elevated TSH; free T 3 and T4 stable   4-HTN; Continue with metoprolol holder parameters.   5-mild COPD exacerbation: Patient was placed on antitussives, nebulizer, consulted strongly on smoking cessation.    Tobacco abuse: Patient was strongly counseled on smoking cessation, nicotine patch prescription was given   Day of Discharge BP 128/63 mmHg  Pulse 97  Temp(Src) 98.3 F (36.8 C) (Oral)  Resp 22  Ht 5\' 4"  (1.626 m)  Wt 84.5 kg (186 lb 4.6 oz)  BMI 31.96 kg/m2  SpO2 97%  Physical Exam: General: Alert and awake oriented x3 not in any acute distress. CVS: S1-S2 clear no murmur rubs or gallops Chest:Mild scattered wheezing  bilaterally improving  Abdomen: soft nontender, nondistended, normal bowel sounds Extremities: no cyanosis, clubbing or edema noted bilaterally Neuro: Cranial nerves II-XII intact, no focal neurological deficits   The results of significant diagnostics from this hospitalization (including imaging, microbiology, ancillary and laboratory) are listed below for reference.    LAB RESULTS: Basic Metabolic Panel:  Recent Labs Lab 08/26/14 0812 08/27/14 1308 08/28/14 0505  NA 137  --  137  K 3.4* 4.8 3.7  CL 102  --  103  CO2 29  --  29  GLUCOSE 84  --  93  BUN <5*  --  <5*  CREATININE 0.56  --  0.48*  CALCIUM 8.1*  --  8.1*   Liver Function Tests: No results for input(s): AST, ALT, ALKPHOS, BILITOT, PROT, ALBUMIN in the last 168 hours. No results for input(s): LIPASE, AMYLASE in the last 168 hours. No results for input(s): AMMONIA in the last 168 hours. CBC:  Recent Labs  Lab 08/23/14 0606 08/26/14 0812  WBC 5.8 5.1  HGB 10.1* 10.6*  HCT 31.4* 33.2*  MCV 88.5 87.4  PLT 341 348   Cardiac Enzymes: No results for input(s): CKTOTAL, CKMB, CKMBINDEX, TROPONINI in the last 168 hours. BNP: Invalid input(s): POCBNP CBG:  Recent Labs Lab 08/27/14 0748 08/28/14 0744  GLUCAP 97 90    Significant Diagnostic Studies:  Ct Abdomen Pelvis W Contrast  08/21/2014   CLINICAL DATA:  Initial encounter for 2 months with intermittent nausea and vomiting.  EXAM: CT ABDOMEN AND PELVIS WITH CONTRAST  TECHNIQUE: Multidetector CT imaging of the abdomen and pelvis was performed using the standard protocol following bolus administration of intravenous contrast.  CONTRAST:  136mL OMNIPAQUE IOHEXOL 300 MG/ML  SOLN  COMPARISON:  The patient reportedly had a prior study at [facility], but that is not available on the timeline.  FINDINGS: Lower chest: Nonspecific interlobular septal thickening is seen in the lung bases with some compressive atelectasis.  Hepatobiliary: No focal  abnormality within the liver parenchyma. Probable tiny stones in the lumen of the gallbladder. No intrahepatic or extrahepatic biliary dilation.  Pancreas: No focal mass lesion. No dilatation of the main duct. No intraparenchymal cyst. No peripancreatic edema.  Spleen: No splenomegaly. No focal mass lesion.  Adrenals/Urinary Tract: No adrenal nodule or mass. Kidneys are unremarkable. No evidence for hydroureter urinary bladder has normal features.  Stomach/Bowel: Peristalsis noted in the distal stomach. Duodenum is normally positioned as is the ligament of Treitz. No small bowel wall thickening. No small bowel dilatation. Terminal ileum is normal. The appendix is unremarkable. Mild wall thickening a is identified in the cecum and ascending colon with relative sparing of the proximal transverse colon. More prominent circumferential wall thickening edema is seen in the distal transverse colon, splenic flexure, and descending colon. The distal sigmoid colon and rectum appear preserved.  Vascular/Lymphatic: Atherosclerotic calcification is noted in the wall of the abdominal aorta without aneurysm. The celiac axis and superior mesenteric artery opacify proximally. The inferior mesenteric artery opacifies. The portal vein is patent as is the superior mesenteric vein.  No lymphadenopathy in the abdomen. No pelvic sidewall lymphadenopathy.  Reproductive: Uterus is surgically absent.  No adnexal mass.  Other: No intraperitoneal free fluid.  Musculoskeletal: Bone windows reveal no worrisome lytic or sclerotic osseous lesions. Superior endplate compression deformity at T12 and L1 is age-indeterminate.  IMPRESSION: 1. Mild circumferential wall thickening in the cecum and ascending colon more advanced edema and wall thickening in the splenic flexure and descending colon. Intervening segment of colon appear to be relatively well preserved. Imaging features could be compatible with mid back disorder laboratory colitis. Ischemia is  considered less likely. 2. Question cholelithiasis apparent   Electronically Signed   By: Misty Stanley M.D.   On: 08/21/2014 17:37       Disposition and Follow-up: Discharge Instructions    Discharge instructions    Complete by:  As directed   Please HOLD metoprolol if SBP in low 100's or give half dose   Please return to ER if fevers >101, severe abdominal pain, profuse diarrhea or vomiting. Please call GI office if diarrhea returns.     Discharge instructions    Complete by:  As directed   Please take Vancomycin taper as outlined below: Start oral vancomycin 125MG  4 times a day x 14days, then taper to 125mg  twice a day  X 7days, then 125mg  daily for 7 days, then 125 mg every other day x 7days,  then 125mg  every 3 days x 14days, then OFF.     Increase activity slowly    Complete by:  As directed             DISPOSITION:Home with home health  DISCHARGE FOLLOW-UP Follow-up Information    Follow up with [facility].   Why:  nebulizer machine   Contact information:   [address] [place] [address] [phone]       Follow up with [facility].   Why:  home health physcial therapy   Contact information:   [address] [place] [address] [phone]       Follow up with Lavera Guise, MD. Schedule an appointment as soon as possible for a visit in 2 weeks.   Specialty:  Internal Medicine   Contact information:   [place] [address] [phone]        Time spent on Discharge: 45 mins (all the discharge instructions discussed in detail with the patient's daughter at the bedside)  Signed:   Jayjay Littles M.D. [facility] 08/28/2014, 2:23 PM Pager: [phone]

## 2014-08-28 NOTE — Progress Notes (Signed)
NURSING PROGRESS NOTE  [name] Douglas [id number] Discharge Data: 08/28/2014 1:15 PM Attending Provider: No att. providers found MWN:UUVO, Timoteo Gaul, MD     [name] to be D/C'd Home per MD order.  Discussed with the patient the After Visit Summary and all questions fully answered. All IV's discontinued with no bleeding noted. All belongings returned to patient for patient to take home.   Last Vital Signs:  Blood pressure 128/63, pulse 97, temperature 98.3 F (36.8 C), temperature source Oral, resp. rate 22, height 5\' 4"  (1.626 m), weight 84.5 kg (186 lb 4.6 oz), SpO2 97 %.  Discharge Medication List   Medication List    STOP taking these medications        amLODipine 10 MG tablet  Commonly known as:  NORVASC      TAKE these medications        ACIDOPHILUS PO  Take by mouth.     albuterol 108 (90 BASE) MCG/ACT inhaler  Commonly known as:  PROVENTIL HFA;VENTOLIN HFA  Inhale 2 puffs into the lungs every 6 (six) hours as needed for wheezing or shortness of breath.     albuterol (2.5 MG/3ML) 0.083% nebulizer solution  Commonly known as:  PROVENTIL  Take 3 mLs (2.5 mg total) by nebulization 2 (two) times daily.     atorvastatin 10 MG tablet  Commonly known as:  LIPITOR  Take 10 mg by mouth every evening.     benzonatate 200 MG capsule  Commonly known as:  TESSALON  Take 1 capsule (200 mg total) by mouth 3 (three) times daily. For cough     feeding supplement (ENSURE COMPLETE) Liqd  Take 237 mLs by mouth 2 (two) times daily between meals. Over the counter     HYDROcodone-homatropine 5-1.5 MG/5ML syrup  Commonly known as:  HYCODAN  Take 5 mLs by mouth every 4 (four) hours as needed for cough.     levothyroxine 88 MCG tablet  Commonly known as:  SYNTHROID, LEVOTHROID  Take 88 mcg by mouth daily before breakfast.     metoprolol 50 MG tablet  Commonly known as:  LOPRESSOR  Take 50 mg by mouth daily.     nicotine 21 mg/24hr patch  Commonly known as:  NICODERM CQ -  dosed in mg/24 hours  Place 1 patch (21 mg total) onto the skin daily.     tiotropium 18 MCG inhalation capsule  Commonly known as:  SPIRIVA HANDIHALER  Place 1 capsule (18 mcg total) into inhaler and inhale daily.     vancomycin 50 mg/mL oral solution  Commonly known as:  VANCOCIN  - Take 2.5 mLs (125 mg total) by mouth 4 (four) times daily. Please take Vancomycin taper as outlined below:  -  Start oral vancomycin 125MG  4 times a day x 14days,  - then taper to 125mg  twice a day  X 7days,  - then 125mg  daily for 7 days,  - then 125 mg every other day x 7days,   - then 125mg  every 3 days x 14days, then OFF.     venlafaxine XR 150 MG 24 hr capsule  Commonly known as:  EFFEXOR-XR  Take 150 mg by mouth daily with breakfast. Also take a 75mg  tablet per daughter         Wallie Renshaw, RN

## 2014-08-30 DIAGNOSIS — E039 Hypothyroidism, unspecified: Secondary | ICD-10-CM | POA: Diagnosis not present

## 2014-08-30 DIAGNOSIS — A047 Enterocolitis due to Clostridium difficile: Secondary | ICD-10-CM | POA: Diagnosis not present

## 2014-08-30 DIAGNOSIS — I10 Essential (primary) hypertension: Secondary | ICD-10-CM | POA: Diagnosis not present

## 2014-08-30 DIAGNOSIS — J449 Chronic obstructive pulmonary disease, unspecified: Secondary | ICD-10-CM | POA: Diagnosis not present

## 2014-08-30 DIAGNOSIS — E876 Hypokalemia: Secondary | ICD-10-CM | POA: Diagnosis not present

## 2014-09-01 DIAGNOSIS — E039 Hypothyroidism, unspecified: Secondary | ICD-10-CM | POA: Diagnosis not present

## 2014-09-01 DIAGNOSIS — J449 Chronic obstructive pulmonary disease, unspecified: Secondary | ICD-10-CM | POA: Diagnosis not present

## 2014-09-01 DIAGNOSIS — A047 Enterocolitis due to Clostridium difficile: Secondary | ICD-10-CM | POA: Diagnosis not present

## 2014-09-01 DIAGNOSIS — E876 Hypokalemia: Secondary | ICD-10-CM | POA: Diagnosis not present

## 2014-09-01 DIAGNOSIS — I10 Essential (primary) hypertension: Secondary | ICD-10-CM | POA: Diagnosis not present

## 2014-09-02 DIAGNOSIS — A047 Enterocolitis due to Clostridium difficile: Secondary | ICD-10-CM | POA: Diagnosis not present

## 2014-09-02 DIAGNOSIS — I10 Essential (primary) hypertension: Secondary | ICD-10-CM | POA: Diagnosis not present

## 2014-09-02 DIAGNOSIS — E876 Hypokalemia: Secondary | ICD-10-CM | POA: Diagnosis not present

## 2014-09-02 DIAGNOSIS — E039 Hypothyroidism, unspecified: Secondary | ICD-10-CM | POA: Diagnosis not present

## 2014-09-02 DIAGNOSIS — J449 Chronic obstructive pulmonary disease, unspecified: Secondary | ICD-10-CM | POA: Diagnosis not present

## 2014-09-08 DIAGNOSIS — E876 Hypokalemia: Secondary | ICD-10-CM | POA: Diagnosis not present

## 2014-09-08 DIAGNOSIS — J449 Chronic obstructive pulmonary disease, unspecified: Secondary | ICD-10-CM | POA: Diagnosis not present

## 2014-09-08 DIAGNOSIS — A047 Enterocolitis due to Clostridium difficile: Secondary | ICD-10-CM | POA: Diagnosis not present

## 2014-09-08 DIAGNOSIS — I10 Essential (primary) hypertension: Secondary | ICD-10-CM | POA: Diagnosis not present

## 2014-09-08 DIAGNOSIS — E039 Hypothyroidism, unspecified: Secondary | ICD-10-CM | POA: Diagnosis not present

## 2014-09-14 DIAGNOSIS — J449 Chronic obstructive pulmonary disease, unspecified: Secondary | ICD-10-CM | POA: Diagnosis not present

## 2014-09-14 DIAGNOSIS — E039 Hypothyroidism, unspecified: Secondary | ICD-10-CM | POA: Diagnosis not present

## 2014-09-14 DIAGNOSIS — E876 Hypokalemia: Secondary | ICD-10-CM | POA: Diagnosis not present

## 2014-09-14 DIAGNOSIS — A047 Enterocolitis due to Clostridium difficile: Secondary | ICD-10-CM | POA: Diagnosis not present

## 2014-09-14 DIAGNOSIS — I10 Essential (primary) hypertension: Secondary | ICD-10-CM | POA: Diagnosis not present

## 2014-09-16 DIAGNOSIS — J449 Chronic obstructive pulmonary disease, unspecified: Secondary | ICD-10-CM | POA: Diagnosis not present

## 2014-09-16 DIAGNOSIS — A047 Enterocolitis due to Clostridium difficile: Secondary | ICD-10-CM | POA: Diagnosis not present

## 2014-09-16 DIAGNOSIS — E039 Hypothyroidism, unspecified: Secondary | ICD-10-CM | POA: Diagnosis not present

## 2014-09-16 DIAGNOSIS — E876 Hypokalemia: Secondary | ICD-10-CM | POA: Diagnosis not present

## 2014-09-16 DIAGNOSIS — I10 Essential (primary) hypertension: Secondary | ICD-10-CM | POA: Diagnosis not present

## 2014-09-22 DIAGNOSIS — A047 Enterocolitis due to Clostridium difficile: Secondary | ICD-10-CM | POA: Diagnosis not present

## 2014-09-23 DIAGNOSIS — A047 Enterocolitis due to Clostridium difficile: Secondary | ICD-10-CM | POA: Diagnosis not present

## 2014-09-23 DIAGNOSIS — E876 Hypokalemia: Secondary | ICD-10-CM | POA: Diagnosis not present

## 2014-09-23 DIAGNOSIS — I10 Essential (primary) hypertension: Secondary | ICD-10-CM | POA: Diagnosis not present

## 2014-09-23 DIAGNOSIS — E039 Hypothyroidism, unspecified: Secondary | ICD-10-CM | POA: Diagnosis not present

## 2014-09-23 DIAGNOSIS — J449 Chronic obstructive pulmonary disease, unspecified: Secondary | ICD-10-CM | POA: Diagnosis not present

## 2014-09-27 DIAGNOSIS — E039 Hypothyroidism, unspecified: Secondary | ICD-10-CM | POA: Diagnosis not present

## 2014-09-27 DIAGNOSIS — I10 Essential (primary) hypertension: Secondary | ICD-10-CM | POA: Diagnosis not present

## 2014-09-27 DIAGNOSIS — J449 Chronic obstructive pulmonary disease, unspecified: Secondary | ICD-10-CM | POA: Diagnosis not present

## 2014-09-27 DIAGNOSIS — A047 Enterocolitis due to Clostridium difficile: Secondary | ICD-10-CM | POA: Diagnosis not present

## 2014-09-27 DIAGNOSIS — E876 Hypokalemia: Secondary | ICD-10-CM | POA: Diagnosis not present

## 2014-10-06 DIAGNOSIS — M545 Low back pain: Secondary | ICD-10-CM | POA: Diagnosis not present

## 2014-10-06 DIAGNOSIS — F17210 Nicotine dependence, cigarettes, uncomplicated: Secondary | ICD-10-CM | POA: Diagnosis not present

## 2014-10-06 DIAGNOSIS — M21372 Foot drop, left foot: Secondary | ICD-10-CM | POA: Diagnosis not present

## 2014-10-06 DIAGNOSIS — E039 Hypothyroidism, unspecified: Secondary | ICD-10-CM | POA: Diagnosis not present

## 2014-10-06 DIAGNOSIS — K50119 Crohn's disease of large intestine with unspecified complications: Secondary | ICD-10-CM | POA: Diagnosis not present

## 2014-10-06 DIAGNOSIS — I10 Essential (primary) hypertension: Secondary | ICD-10-CM | POA: Diagnosis not present

## 2014-10-16 DIAGNOSIS — A047 Enterocolitis due to Clostridium difficile: Secondary | ICD-10-CM | POA: Diagnosis not present

## 2014-10-16 DIAGNOSIS — J449 Chronic obstructive pulmonary disease, unspecified: Secondary | ICD-10-CM | POA: Diagnosis not present

## 2014-10-16 DIAGNOSIS — I10 Essential (primary) hypertension: Secondary | ICD-10-CM | POA: Diagnosis not present

## 2014-10-16 DIAGNOSIS — E039 Hypothyroidism, unspecified: Secondary | ICD-10-CM | POA: Diagnosis not present

## 2014-10-16 DIAGNOSIS — E876 Hypokalemia: Secondary | ICD-10-CM | POA: Diagnosis not present

## 2014-10-20 DIAGNOSIS — M21372 Foot drop, left foot: Secondary | ICD-10-CM | POA: Diagnosis not present

## 2014-10-26 DIAGNOSIS — E039 Hypothyroidism, unspecified: Secondary | ICD-10-CM | POA: Diagnosis not present

## 2014-10-26 DIAGNOSIS — E876 Hypokalemia: Secondary | ICD-10-CM | POA: Diagnosis not present

## 2014-10-26 DIAGNOSIS — J449 Chronic obstructive pulmonary disease, unspecified: Secondary | ICD-10-CM | POA: Diagnosis not present

## 2014-10-26 DIAGNOSIS — I10 Essential (primary) hypertension: Secondary | ICD-10-CM | POA: Diagnosis not present

## 2014-10-26 DIAGNOSIS — A047 Enterocolitis due to Clostridium difficile: Secondary | ICD-10-CM | POA: Diagnosis not present

## 2014-11-25 DIAGNOSIS — D490 Neoplasm of unspecified behavior of digestive system: Secondary | ICD-10-CM | POA: Diagnosis not present

## 2014-11-25 DIAGNOSIS — A047 Enterocolitis due to Clostridium difficile: Secondary | ICD-10-CM | POA: Diagnosis not present

## 2014-12-07 DIAGNOSIS — A047 Enterocolitis due to Clostridium difficile: Secondary | ICD-10-CM | POA: Diagnosis not present

## 2014-12-07 DIAGNOSIS — J449 Chronic obstructive pulmonary disease, unspecified: Secondary | ICD-10-CM | POA: Diagnosis not present

## 2014-12-07 DIAGNOSIS — E039 Hypothyroidism, unspecified: Secondary | ICD-10-CM | POA: Diagnosis not present

## 2014-12-07 DIAGNOSIS — I10 Essential (primary) hypertension: Secondary | ICD-10-CM | POA: Diagnosis not present

## 2014-12-07 DIAGNOSIS — E876 Hypokalemia: Secondary | ICD-10-CM | POA: Diagnosis not present

## 2014-12-11 NOTE — H&P (Signed)
PATIENT NAME:  [name] Douglas, [name] Douglas MR#:  [id number] DATE OF BIRTH:  Jan 18, 1941  DATE OF ADMISSION:  08/03/2014  REFERRING DOCTOR:  Larae Grooms, MD    PRIMARY CARE DOCTOR:  Lavera Guise, MD    CHIEF COMPLAINT: Status post fall with pain left side of the body.   HISTORY OF PRESENT ILLNESS: A 74 year old Caucasian female with a past medical history of hypertension, COPD, on intermittent home oxygen, hyperlipidemia, hypothyroidism, osteoporosis, history of recent colitis with and C difficile colitis, known to GI, came to the Emergency Room with a history of fall that happened earlier today following which she developed pain on the left side of the body. The patient was earlier seen in the Emergency Room earlier yesterday in the morning when she came because of passing some bloody tissue per rectum and when she contacted her gastroenterologist she was advised to come to the Emergency Room. The patient has a history of recently diagnosed C difficile colitis for which she is using p.o. vancomycin as per gastroenterologist. Did not have any rectal bleeding. The patient was evaluated by the ED physician and stool guaiac was negative, and since she was stable and the workup was negative and after discussing with the gastroenterologist she was sent home by the ED physician.   The patient returned home, and following entering her home she fell and sustained injury to the left side of the body where that caused her pain. Hence she came back to the Emergency Room for further evaluation. The patient denies any loss of consciousness. She also denies any dizziness, but she states that she does not know the reason for the fall. She did not have any chest pain, no shortness of breath, no palpitations prior to the event. And even after coming to the Emergency Room she did not have any chest pain, shortness of breath, dizziness, syncopal episodes or palpitations. No history of any fever, cough. No chest pains. No shortness  of breath. No nausea. No vomiting. She does have some chronic diarrhea ongoing for the past few weeks. It is secondary due to her colitis, which was diagnosed in November of 2015. No urinary symptoms.   In the Emergency Room, the patient was evaluated by the ED physician and was found to be with stable vitals except for a heart rate of 116 and she had a workup done which showed all the labs within normal limits including troponin, which was within the normal level, and she underwent CT of the head, which was negative for any acute injury. She also had a CT of the chest which was also negative, and a CT of the abdomen was also negative for any internal injury but positive for thickening of some colonic wall secondary due to her colitis. She received IV fluids by the ED physician and pain medications and she remains stable except for her heart rate is slightly elevated and because of continued pain, hospitalist service was consulted for further evaluation and management and for possible admission for observation.   As noted earlier, the patient did not have any chest pain, no shortness of breath, no dizziness, no loss of consciousness. The patient denies any of those symptoms at this time. The patient states that the pain is under control with the pain medications that were given in the Emergency Room.   PAST MEDICAL HISTORY:  1.  COPD, uses oxygen intermittently at home at nighttime.  2.  Hyperlipidemia.  3.  Hypertension.  4.  Hypothyroidism.  5.  Osteoporosis.  6.  History of recent colitis with admission to [facility] in November during which time she was seen by GI and treated with Cipro, Flagyl and also steroids. She has seen her gastroenterologist 2 weeks ago as a followup of the hospital discharge. During that time she had a stool studies done which came back as positive for C difficile toxin. Hence, she was advised to take p.o. vancomycin for 2 weeks of which she completed 1  week course.   PAST SURGICAL HISTORY: Status post left total knee replacement, status post tonsillectomy, status post tubal ligation, status post hysterectomy, status post hemorrhoidectomy, status post right rotator cuff repair.   ALLERGIES: No known drug allergies.   HOME MEDICATIONS:  1.  Acetaminophen/tramadol 325/37.5 mg tablet, 1 tablet every 4 hours as needed.  2.  Allergy medication 25 mg 1 tablet orally once a day as needed.  3.  Amlodipine 5 mg tablet, 2 tablets orally once a day.  4.  Bentyl 20 mg tablet, 1 tablet orally 4 times a day.  5.  Calcium with vitamin D 1 tablet 2 times a day.  6.  Effexor-XR 150 mg, 1 tablet daily in the morning.  7.  Effexor-XR 75 mg 1 tablet daily once a day.  8.  Gabapentin 100 mg tablet, 1 tablet orally 3 times a day.  9.  Levothyroxine 75 mcg tablet, 1 tablet orally once a day.  10.  Lipitor 10 mg tablet, 1 tablet orally once a day.  11.  Metoprolol succinate 50 mg tablet extended release tablet, 1 tablet orally once a day.   12.  Vancomycin 500 mg tablet, 1 tablet orally 4 times a day for the past 1 week.   13.  Advair 1 puff twice a day.   14.  Spiriva 1 puff once a day.   SOCIAL HISTORY: She is single, lives alone,  history of smoking 1 pack per day for the past 50+ years. Denies any alcohol or substance abuse.   FAMILY HISTORY: Nil significant.   REVIEW OF SYSTEMS:   CONSTITUTIONAL: Negative for fever, fatigue, generalized weakness. She does have pain on the left side of the body following fall, which was just sustained earlier today.  EYES: Negative for blurred vision, double vision. No pain. No redness. No inflammation.  EARS, NOSE, AND THROAT: Negative for tinnitus, ear pain, hearing loss. No epistaxis, nasal discharge, or difficulty swallowing.  RESPIRATORY: Negative for cough, wheezing, hemoptysis or dyspnea, painful respirations. History of COPD and takes inhalers at home and uses oxygen at night intermittently.   CARDIOVASCULAR:  Negative for chest pain, palpitations, dizziness, or loss of consciousness. No pedal edema.  GASTROINTESTINAL: Negative for nausea, vomiting. She does have some chronic diarrhea ongoing for the past few weeks following her diagnosis of colitis and she was found positive for C. difficile toxin about 1 week ago and using p.o. vancomycin.  GENITOURINARY: Negative for dysuria, hematuria, frequency, urgency.  ENDOCRINE: Negative for polyuria, nocturia, history of hypothyroidism, takes levothyroxine.  HEMATOLOGIC: Negative for anemia, easy bruising, bleeding, swollen glands.  INTEGUMENTARY: Negative for acne, skin rash, or lesions.  MUSCULOSKELETAL: Negative for any joint arthritis. She does have some pain on the left side of the body following fall earlier today.  NEUROLOGICAL: Negative for focal weakness or numbness. No history of CVA, TIA, seizure disorder.  PSYCHIATRIC: Negative for anxiety. She does have history of depression for which she takes Effexor.   PHYSICAL EXAMINATION:  VITAL SIGNS: Temperature 99 degrees  Fahrenheit, pulse rate 114 per minute, respirations 20 per minute, blood pressure 148/52, O2 saturations 98% on room air.  GENERAL: Elderly lady, well developed, well nourished, alert and oriented x 3, in no acute distress, comfortable lying in the bed, pleasant and cooperative.  HEAD: Atraumatic, normocephalic.  EYES: Pupils equal, reacting to light and accommodation, no conjunctival pallor, no scleral icterus, extraocular movements intact.  NOSE: No nasal lesions. No drainage.  EARS: No drainage. No external lesions.  ORAL CAVITY: No mucosal lesions. No exudates. No masses.  NECK: Supple. No JVD. No thyromegaly. No carotid bruit. Range of motion of neck normal.  RESPIRATORY: Good respiratory effort. Not using accessory muscles of respiration. Bilateral vesicular breath sounds present. No rales or rhonchi heard.  CARDIOVASCULAR: S1, S2 regular. No murmurs, gallops, or clicks  appreciated. Peripheral pulses equal at carotid, femoral, and pedal pulses. No peripheral edema.  GASTROINTESTINAL: Abdomen is soft. Nontender, no hepatosplenomegaly. Bowel sounds present and equal in all 4 quadrants. No guarding. No rigidity. No rebound.  GENITOURINARY: Deferred.  MUSCULOSKELETAL: Range of motion normal in all areas. Strength and tone equal bilaterally.  SKIN: Superficial abrasion over left elbow and right knee. No active bleeding.   LYMPHATIC: No cervical lymphadenopathy.  VASCULAR: Good dorsalis pedis and posterior tibial pulses.  NEUROLOGICAL: Alert, awake, and oriented x 3. Cranial nerves II-XII grossly intact. DTRs 2+, symmetrical bilateral in both upper and lower extremities. Motor strength 5/5 in both upper and lower extremities.  PSYCHIATRIC: Judgment, insight adequate. Alert and oriented x 3. Memory and mood within normal limits.   LABORATORY DATA: Serum glucose 207, BUN 5, creatinine 1.0, serum sodium 140, potassium 3.9, chloride 105, bicarbonate 28, total calcium 8.3, lipase less than 50, total protein 5.8, serum albumin 2.5, total bilirubin 0.4, alkaline phosphatase 100, AST 31, ALT 27, troponin 2 sets less than 0.02. TSH 5.22, WBC 5.7, hemoglobin 11.1, hematocrit 33.4, platelet count 349. Urinalysis unremarkable.  C difficile positive, which was done on 07/27/2014.   IMAGING STUDIES: X-ray chest:  Lungs are hyperinflated likely secondary due to COPD. No focal parenchymal opacity or pleural effusion or pneumothorax. Impression: No active cardiopulmonary disease.   CT of the head negative for any acute findings. CT of the chest:  No lung mass or consolidation. No bony abnormality. CT of abdomen and the pelvis: Liver, gallbladder, spleen, pancreas,  kidneys within normal limits. Wall thickening, inflammatory change of the colon, the splenic flexure, distal transverse colon and proximal descending colon which have increased since prior study.   EKG: Sinus tachycardia with a  ventricular rate of 115 beats per minute. No acute ST, T changes.   ASSESSMENT AND PLAN: A 74 year old Caucasian female with a past medical history significant for chronic obstructive pulmonary disease, hypertension, hyperlipidemia, hypothyroidism, recent Clostridium difficile colitis on p.o. vancomycin, presents to the Emergency Room following a fall possibly likely secondary due to presyncope secondary due to dehydration.  1.  Status post fall with superficial abrasion over right knee and the left elbow. CT chest, head and abdomen and the pelvis negative for any internal injuries and bony injuries. Plan: Admit to medical floor for pain control and PT consultation.  2.  Near syncope causing fall likely secondary due to dehydration. Troponin x 2 negative. EKG: No acute changes. The patient not short of breath. No chest pain. Plan: Admit to telemetry. IV hydration, follow BP and monitoring and check orthostatic BP changes.  3.  History of Clostridium difficile colitis, known to gastrointestinal, using p.o. vancomycin for the  past 1 week and to complete about 1 week course. CT done in the Emergency Room significant for thickening of the colonic wall. Plan: Continue p.o. vancomycin and gastrointestinal consultation for further advice.  4.  Recent [facility] admission in November 2015 with acute colitis, status post treatment with Cipro and Flagyl and prednisone. We will consult GI  for further advice.  5.  Hypertension, stable on home medications. Continue same.  6.  Chronic obstructive pulmonary disease, stable on home medications. Continue Advair and Spiriva and p.r.n. oxygen supplementation.  7.  Hyperlipidemia, on statin.   8.  Hypothyroidism, on Levoxyl.  Continue same.  9.  Active tobacco usage, continuous. Not interested in quitting. Counseled.  10.  Deep venous thrombosis prophylaxis, subcutaneous Lovenox.  11.  Gastrointestinal prophylaxis, Protonix.   CODE STATUS: Full  code.   TIME SPENT: 55 minutes.    ____________________________ Juluis Mire, MD enr:AT D: 08/03/2014 01:39:40 ET T: 08/03/2014 02:40:05 ET JOB#: [id number]  cc: Juluis Mire, MD, <Dictator> Lavera Guise, MD Juluis Mire MD ELECTRONICALLY SIGNED 08/03/2014 16:39

## 2014-12-11 NOTE — H&P (Signed)
PATIENT NAME:  [name] Douglas, [name] Douglas MR#:  [id number] DATE OF BIRTH:  Nov 27, 1940  DATE OF ADMISSION:  06/19/2014  PRIMARY CARE PHYSICIAN: Lavera Guise, MD  REFERRING PHYSICIAN: Debbrah Alar, MD  CHIEF COMPLAINT: Abdominal pain and diarrhea today.   HISTORY OF PRESENT ILLNESS: A 74 year old Caucasian female with a history of hypertension, hyperlipidemia, COPD, presented to the ED with the above chief complaint. The patient is alert, awake, oriented, in no acute distress. According to the patient and the patient's daughter, the patient started to have abdominal pain today, which is in the middle part of the abdomen, intermittent, aching, 7/10 without radiation. In addition, the patient has nausea, vomiting, and diarrhea. The patient also noticed bloody stool today. The patient denies any fever or chills. Denies any other symptoms.   PAST MEDICAL HISTORY: COPD, hypertension, hyperlipidemia, osteoporosis.   PAST SURGICAL HISTORY: Hysterectomy, left total knee replacement, tonsillectomy, tubal ligation, hemorrhoidectomy, right rotator cuff repair.   SOCIAL HISTORY: Smoking 1 pack a day for more than 40 years. Denies any alcohol drinking or illicit drugs.   ALLERGIES: No.   HOME MEDICATIONS: 1.  Oxycodone 5 mg 1 to 2 tablets every 6 hours p.r.n. 2.  Zofran 4 mg p.o. every 8 hours p.r.n. 3.  Lipitor 10 mg p.o. daily at bedtime.  4.  Levothyroxine 75 mcg p.o. daily.  5.  Effexor XR 150 mg p.o. daily.  6.  Bentyl 20 mg p.o. 4 times a day p.r.n. 7.  Norvasc 5 mg 2 tablets p.o. at bedtime.  REVIEW OF SYSTEMS:  CONSTITUTIONAL: The patient denies any fever or chills. No headache or dizziness, but has weakness.  EYES: No double vision or blurry vision.  ENT: No postnasal drip, slurred speech, or dysphagia.  CARDIOVASCULAR: No chest pain, palpitation, orthopnea, nocturnal dyspnea. No leg edema.  PULMONARY: No cough, sputum, shortness of breath, or hemoptysis.  GASTROINTESTINAL: Positive for  abdominal pain, nausea, vomiting, diarrhea, and bloody stool. GENITOURINARY: No dysuria, hematuria, or incontinence.  SKIN: No rash or jaundice.  NEUROLOGY: No syncope, loss of consciousness, or seizure.  ENDOCRINOLOGY: No polyuria, polydipsia, heat or cold intolerance.  HEMATOLOGY: No easy bruising or bleeding but has bloody stool.   PHYSICAL EXAMINATION:  VITAL SIGNS: Temperature 97.6, blood pressure 160/64, pulse 122, oxygen saturation 96% on room air.  GENERAL: The patient is alert, awake, oriented, in no acute distress.  HEENT: Pupils round, equal, and reactive to light and accommodation. Moist oral mucosa. Clear oropharynx.  NECK: Supple. No JVD or carotid bruit. No lymphadenopathy No thyromegaly.  CARDIOVASCULAR: S1, S2 regular rate and rhythm. No murmurs or gallops.  PULMONARY: Bilateral air entry. No wheezing or rales. No use of accessory muscles to breathe.  ABDOMEN: Soft. No distention, but has tenderness in the middle part of the abdomen and the right lower quadrant. No rigidity. No rebound. Bowel sounds present. No organomegaly.  EXTREMITIES: No edema, clubbing, or cyanosis. No calf tenderness. Bilateral pedal pulses present.  SKIN: No rash or jaundice.  NEUROLOGY: A and O x 3. No focal deficit. Power 5/5. Sensation intact.   LABORATORY DATA: 1.  CAT scan of abdomen and pelvis shows ileocolitis.  2.  Stool Clostridium difficile test is negative. 3.  Urinalysis is negative. 4.  WBC 15.2, hemoglobin 13.8, platelets 294,000. Lipase 59. Glucose 139, BUN 17, creatinine 0.84. Electrolytes are normal. Troponin less than 0.02.   IMPRESSIONS:  1.  Acute ileocolitis.  2.  Gastrointestinal bleeding. 3.  Leukocytosis.  4.  Hypertension.  5.  Hyperlipidemia.  6.  Chronic obstructive pulmonary disease, stable.   PLAN OF TREATMENT:  1.  The patient will be admitted to medical floor. We will start Cipro and Flagyl and follow up CBC, BMP, magnesium level, and GI consult. Will keep  n.p.o. except the medications.  2.  For hypertension, we will continue Norvasc.  3.  COPD, stable. 4.  For tobacco abuse, smoking cessation was counseled for 4 to 5 minutes. We have given nicotine patch.   I discussed the patient's condition and plan of treatment with the patient and the patient's daughter.  CODE STATUS: The patient wants limited code. She wants CPR but no intubation.  TIME SPENT: About 61 minutes.    ____________________________ Demetrios Loll, MD qc:ST D: 06/19/2014 20:32:33 ET T: 06/19/2014 22:33:04 ET JOB#: [id number]  cc: Demetrios Loll, MD, <Dictator> Demetrios Loll MD ELECTRONICALLY SIGNED 06/22/2014 15:30

## 2014-12-11 NOTE — Consult Note (Signed)
Plan for stool transplant and colonoscopy Friday.  Will stop vancomycin now in preparation for the treatment Friday.  Electronic Signatures: Manya Silvas (MD)  (Signed on 16-Dec-15 17:23)  Authored  Last Updated: 16-Dec-15 17:23 by Manya Silvas (MD)

## 2014-12-11 NOTE — Discharge Summary (Signed)
PATIENT NAME:  [name] Douglas, [name] Douglas MR#:  [id number] DATE OF BIRTH:  Jun 20, 1941  DATE OF ADMISSION:  08/03/2014 DATE OF DISCHARGE:  08/08/2014  For a detailed note, please take a look at the history and physical done on admission by Dr. Reece Levy.   DIAGNOSES AT DISCHARGE: Is as follows:  1. Recurrent Clostridium difficile colitis, now improved.  2. Dehydration secondary to Clostridium difficile colitis, now resolved.  3. Weakness status post fall secondary to dehydration, also much improved.  4. Hypertension.  5. Hyperlipidemia.  6. Hypothyroidism.  7. Depression.   DIET: The patient is being discharged on a low-sodium diet.   ACTIVITY: As tolerated.   FOLLOWUP: With Dr. Dorise Bullion, Dr. Gaylyn Cheers in the next 1 to 2 weeks.    DISCHARGE MEDICATIONS: Effexor 150 mg daily, calcium vitamin D 1 tablet b.i.d., gabapentin 100 mg 3 times a day, amlodipine 5 mg 2 tablets daily, Synthroid 75 mcg daily, Lipitor 10 mg daily, metoprolol succinate 50 mg daily, Effexor-XR 75 mg daily, Tylenol with tramadol 37.5/325 one tablet every 4 hours as needed, Senokot 1 tablet at bedtime, Bentyl 20 mg 4 times daily as needed, acidophilus 2 tablets daily.   CONSULTANTS DURING THE HOSPITAL COURSE: Dr. Vira Agar and Dr. Dorise Bullion from gastroenterology.   PERTINENT STUDIES: Done during the hospital course: Are as follows: A CT scan of the head done without contrast showing no acute intracranial abnormality. CT of the chest, abdomen and pelvis with contrast showing no evidence of  acute abnormality  in the abdomen or chest. Early interstitial lung disease. A nuclear medicine lung VQ scan showing no evidence of any pulmonary embolism. An ultrasound of the carotids showing no evidence of any hemodynamically significant carotid artery stenosis.   HOSPITAL COURSE: This is a 74 year old female with medical problems as mentioned above, presented to the hospital on 08/03/2014 secondary to a fall and left-sided pain.  1. Generalized  weakness with a fall. This was likely secondary to severe orthostatic hypotension and dehydration due to her ongoing diarrhea. After being treated for her diarrhea and her dehydration improved, her weakness has significantly improved. At one point, she was seen by physical therapy and thought she would need home health services, but she presently does not. She is clinically doing well and therefore being discharged home without any services.  2. Recurrent Clostridium difficile colitis. The patient apparently was on oral vancomycin prior to coming in. She was also on Xifaxan, although her symptoms were not improving. She was admitted to the hospital and underwent a fecal transplant on 12/18. Post fecal transplant, the patient's clinical symptoms improved. She is not having any diarrhea presently. She is tolerating a regular diet well and therefore, being discharged home with close followup with gastroenterology. I did put her on a probiotic prior to discharge.  3. Hypoxia. The exact etiology of this is unclear, but suspected to be possibly related to underlying interstitial lung disease. The patient underwent an extensive workup for hypoxia including a VQ scan, which was low probability for PE. She also had a CT chest on admission, which showed possible interstitial lung disease. She had no evidence of congestive heart failure. Her oxygen saturation is presently stable on room air 92% to 93% and therefore, she is being discharged home.  4. Orthostatic hypotension. This was secondary to dehydration from the diarrhea. After getting fluids, her diarrhea improved. This has now resolved.  5. Depression. The patient was maintained on her Effexor. She will resume that.  6. Hypothyroidism.  The patient was maintained on her Synthroid. She will resume that.  7. GERD. The patient was maintained on her Protonix and she will also resume that upon discharge.   CODE STATUS: The patient is a full code.   DISPOSITION: She  is being discharged home.   TIME SPENT: 40 minutes.    ____________________________ Belia Heman. Verdell Carmine, MD vjs:TT D: 08/08/2014 15:08:33 ET T: 08/08/2014 21:22:57 ET JOB#: [id number]  cc: Belia Heman. Verdell Carmine, MD, <Dictator> Henreitta Leber MD ELECTRONICALLY SIGNED 08/19/2014 10:45

## 2014-12-11 NOTE — Consult Note (Signed)
Pt had colonoscopy for C. diff not responding to medication and a recurrence.  She has significant healing ischemic colitis of sigmoid/descending colon with some regenerations spots.  Stool transplant done.  Pt tol well.  Would like to see how she does with the ischemic colitis and recommend stay in 1-2 more days at least.  Discussed with her daughter.  Electronic Signatures: Manya Silvas (MD)  (Signed on 18-Dec-15 13:23)  Authored  Last Updated: 18-Dec-15 13:23 by Manya Silvas (MD)

## 2014-12-11 NOTE — Consult Note (Signed)
I have stopped Lovenox for the colonoscopy test tomorrow and will resume after the test.  Pt to drink miralax for the prep.    Electronic Signatures: Manya Silvas (MD)  (Signed on 17-Dec-15 17:14)  Authored  Last Updated: 17-Dec-15 17:14 by Manya Silvas (MD)

## 2014-12-11 NOTE — Consult Note (Signed)
Chief Complaint:  Subjective/Chief Complaint Covering for both Drs. Rayann Heman and Goulds. S/P stool transplant yesterday. Feeling well. On solids. No significant abdominal pain. No nausea.   VITAL SIGNS/ANCILLARY NOTES: **Vital Signs.:   19-Dec-15 07:30  Pulse Pulse 94  Respirations Respirations 20  Systolic BP Systolic BP 517  Diastolic BP (mmHg) Diastolic BP (mmHg) 52  Mean BP 68  Pulse Ox % Pulse Ox % 94  Pulse Ox Activity Level  At rest  Oxygen Delivery Room Air/ 21 %   Brief Assessment:  GEN no acute distress   Cardiac Regular   Respiratory clear BS   Gastrointestinal details normal Soft  Nontender  Nondistended  Bowel sounds normal   Lab Results: Routine Micro:  17-Dec-15 13:16   Micro Text Report STOOL COMPREHENSIVE   COMMENT                   NO SALMONELLA OR SHIGELLA ISOLATED   COMMENT                   NO PATHOGENIC E.COLI DETECTED   COMMENT                   NO CAMPYLOBACTER ANTIGEN DETECTED   ANTIBIOTIC                        Micro Text Report CLOSTRIDIUM DIFFICILE   C.DIFFICILE ANTIGEN       C.DIFFICILE GDH ANTIGEN : POSITIVE   C.DIFFICILE TOXIN A/B     C.DIFFICILE TOXINS A AND B : NEGATIVE   PCR FOR TOXIGENIC C.DIFF  PCR FOR TOXIGENIC C.DIFFICILE : POSITIVE   INTERPRETATION Positive for toxigenic C. difficile, active toxin production NOT detected.  Patient has toxigenic C. difficile organisms present in the bowel, but toxin was not detected.  The patient may be a carrier or the level of toxin in the sample was below the limit of detection. This information should be used in conjunction with the patient's clinical history when deciding on possible therapy.   ANTIBIOTIC                       Culture Comment NO SALMONELLA OR SHIGELLA ISOLATED  Culture Comment . NO PATHOGENIC E.COLI DETECTED  Culture Comment    . NO CAMPYLOBACTER ANTIGEN DETECTED  Result(s) reported on 06 Aug 2014 at 01:45PM.  Routine Chem:  17-Dec-15 13:16   Result Comment C-DIFFICILE -  C/BRANDY [id number]  - NOTIFIED OF CRITICAL VALUE  - READ-BACK PROCESS PERFORMED.  Result(s) reported on 05 Aug 2014 at 04:11PM.  Routine Sero:  17-Dec-15 13:16   Occult Blood, Feces NEGATIVE (Result(s) reported on 05 Aug 2014 at 01:47PM.)   Assessment/Plan:  Assessment/Plan:  Assessment C.diff and ischemic colitis. S/P stool transplant.   Plan See how she does today on reg diet. If stable, can be discharged by tomorrow AM. Pt can f/u with either Drs Rayann Heman or Vira Agar. Will sign off. Thanks.   Electronic Signatures: Verdie Shire (MD)  (Signed 19-Dec-15 09:19)  Authored: Chief Complaint, VITAL SIGNS/ANCILLARY NOTES, Brief Assessment, Lab Results, Assessment/Plan   Last Updated: 19-Dec-15 09:19 by Verdie Shire (MD)

## 2014-12-11 NOTE — Consult Note (Signed)
Pt had ischemic colitis on colonoscopy.  This was the source of her bleeding and pain.  Diarrhea made worse by the C. diff which has been treated.   I think she can go home Sunday after a day of eating more normal food tomorrow.  I will be glad to see her in the office 1-2 weeks after discharge.  Dr.Oh on this weekend and will see her tomorrow.  Electronic Signatures: Manya Silvas (MD)  (Signed on 18-Dec-15 18:10)  Authored  Last Updated: 18-Dec-15 18:10 by Manya Silvas (MD)

## 2014-12-11 NOTE — Consult Note (Signed)
Pt feeling much better, a lot less pain in RLQ.  Maybe home tomorrow on oral meds.  Would give prednisone 40 mg a day and taper over 3-4 weeks.  Will be glad to see in office in a week or so after discharge.  Electronic Signatures: Manya Silvas (MD)  (Signed on 03-Nov-15 17:52)  Authored  Last Updated: 03-Nov-15 17:52 by Manya Silvas (MD)

## 2014-12-11 NOTE — Consult Note (Signed)
Details:   - GI Note:  feels much better today. Still 4 + stools in 24 hr but decreased.  Toelrating PO well. No f/c., n/v  Exam: vss nabs, nt,nd,soft a and o x 4, nad  A/P: - cont P vanc 500 mg q6 hr - Dr Tiffany Kocher plans for colonoscopy with stool transplant on Friday.   Electronic Signatures: Arther Dames (MD)  (Signed 16-Dec-15 17:14)  Authored: Details   Last Updated: 16-Dec-15 17:14 by Arther Dames (MD)

## 2014-12-11 NOTE — Consult Note (Signed)
PATIENT NAME:  [name] Douglas, [name] Douglas MR#:  [id number] DATE OF BIRTH:  [birth year]  DATE OF CONSULTATION:  06/20/2014  REFERRING PHYSICIAN:   CONSULTING PHYSICIAN:  Manya Silvas, MD  HISTORY OF PRESENT ILLNESS: The patient is a 74 year old, white female, who had the rather sudden onset of abdominal pain followed by vomiting and diarrhea and bloody stools. I was asked to see her in consultation.   The patient had a CAT scan that showed inflammation in the distal terminal ileum, distal ileum and proximal colon. Her pain was described as mid abdominal and right lower, aching anywhere from a 7 out of 10 to 10 out of 10. She did not have any vomiting of blood. It was clear liquids, although she did have some bloody stools that were loose.   PAST MEDICAL HISTORY: Hypertension, osteoporosis, COPD, hyperlipidemia.   PAST SURGICAL HISTORY: Right rotator cuff repair, tonsillectomy, tubal ligation, hemorrhoidectomy, left total knee replacement and hysterectomy. She thinks it was a complete hysterectomy.   HABITS: Smokes a pack a day for 50 years. No alcohol.   ALLERGIES: No known drug allergies.   HOME MEDICATIONS: Oxycodone 5 mg 1 or 2 tablets q.6 h. p.r.n., Zofran 4 mg q.8 h. p.r.n., Lipitor 10 mg p.o. daily, levothyroxine 75 mcg daily, Effexor-XR 150 mg a day,  unknown medicine for crampy abd pain. Norvasc 5 mg 2 tablets at bedtime. She uses inhalers p.r.n.    REVIEW OF SYSTEMS: She denies any recent changes in vision or hearing. She has had cataract surgery on both eyes. No bleeding from her nose. No mouth lesions. No chest pains. No irregular skipping heartbeats. She does have chronic cough from smoking and she wears oxygen at night, 2 liters per nasal cannula. No known liver disease. No history of diabetes. No history of strokes. No history of heart attacks. She has a problem with headaches over her right eye that come and go, and she takes BCs 2 or 3 a day for this. She denies dysphagia. No constipation  or diarrhea until the attack of this illness yesterday. No dysuria, hematuria or urinary problems.   FAMILY HISTORY: Negative for Crohn's, negative for rheumatoid arthritis, negative for psoriasis, negative for ulcerative colitis. Mother died of lung cancer and father died of congestive heart failure.   PHYSICAL EXAMINATION:  GENERAL: Elderly white female in no acute distress with frequent coughing.  VITAL SIGNS: Pulse 116, blood pressure 122/54, pulse oximetry 96% on 2 liters, temperature 99.4.   LABORATORY DATA: Shows inflammation in the distal ileum and right proximal colon on CAT scan.   White count yesterday was 15.2, today is 22.2. Glucose 146, BUN 15, creatinine 0.84, sodium 136, potassium 3.8, chloride 103, CO2 of 27, calcium 7.9, magnesium 1.3, total protein 7.4, albumin 4, total bilirubin 0.5, alkaline phosphatase 102, SGOT 20, SGPT 20. Troponin less than 0.02. Hemoglobin 14.1, platelet count 273,000.   Clostridium difficile test is negative.   Urinalysis shows 1+ blood.  A flat and erect abdomen shows a nonspecific gas pattern, degenerative lumbar changes.   The CAT scan of the abdomen showed a diffuse fatty liver, a normal spleen, normal pancreas, gallbladder not distended, normal adrenal glands. No pelvic mass or fluid collection. Appendix is unremarkable. There is thickening in the small intestine wall diffusely, particularly prominent in the cecum. Findings suggest ileocolitis.   ASSESSMENT:  1. Acute onset of ileocolitis. The possibilities include medication from nonsteroidal. 2. Acute onset of infection can also cause this presentation. Yersinia is one that targets  this area for unknown reasons. Stool cultures are pending. She is on Cipro and Flagyl at the moment. I would definitely continue those for now. 3. Another possibility, of course, is that she has new onset Crohn disease.   RECOMMENDATIONS:  1. Continue antibiotic therapy until stool cultures are back.  2. If the  cultures return negative, I would probably recommend to start her on some steroids, equivalent to prednisone 40 to 60 mg a day, and then consider a possible colonoscopy.   I will follow with you.   ____________________________ Manya Silvas, MD rte:JT D: 06/20/2014 08:26:55 ET T: 06/20/2014 10:57:41 ET JOB#: [id number]  cc: Manya Silvas, MD, <Dictator> Lavera Guise, MD Manya Silvas MD ELECTRONICALLY SIGNED 06/25/2014 15:04

## 2014-12-11 NOTE — Consult Note (Signed)
Pt feels a little better today and not passing blood.  still tender in RLQ on palpation. WBC down a bit, probably will go up with steroids.  Eventually will need colonoscopy.  Continue antibiotics.  VSS afebrile.  Electronic Signatures: Manya Silvas (MD)  (Signed on 02-Nov-15 16:43)  Authored  Last Updated: 02-Nov-15 16:43 by Manya Silvas (MD)

## 2014-12-11 NOTE — Discharge Summary (Signed)
PATIENT NAME:  Douglas Douglas MR#:  [id number] DATE OF BIRTH:  [birth year]  DATE OF ADMISSION:  06/19/2014 DATE OF DISCHARGE:  06/23/2014   ADMISSION DIAGNOSIS:  Ileocolitis.   DISCHARGE DIAGNOSIS:   1.  Ileocolitis.   2.  Lower gastrointestinal bleed likely from colitis. 3.  Elevated white blood cell count from inflammatory process.  4.  Chronic obstructive pulmonary disease.  5.  Tobacco abuse. 6.  Essential hypertension.  7.  Hyperthyroidism.   CONSULTATIONS: GI.   PERTINENT LABORATORIES AT DISCHARGE: White blood cells 18, hemoglobin 12, hematocrit 35.2, platelets 286,000.   Magnesium 1.9. Sodium 141, potassium 3.5, chloride 107, bicarbonate 27, BUN 5, creatinine 0.51, glucose 132.  HOSPITAL COURSE: This is a very pleasant 74 year old female who presented with abdominal pain. Had a CT scan which was positive for ileocolitis.  For further details, please see the H and P.   1.  Ileocolitis. The patient was started on Flagyl and ciprofloxacin. Her CT scan did show evidence of ileocolitis. GI was consulted regarding this. This could be inflammatory bowel disease so she was placed on steroids as her stool cultures were negative, as well Clostridium difficile culture. She has improved dramatically since admission. She is tolerating her soft diet. We will continue on soft diet for about a week.  2.  Lower GI bleed from colitis. Her hemoglobin remained stable throughout the hospitalization.  No intervention at this time as per GI.   3.  Elevated white blood cell count initially from inflammatory process and now more likely from her steroids. She was afebrile.   4.  History of chronic obstructive pulmonary disease, which is stable.  5.  Tobacco abuse. The patient was counseled by the admitting M.D.  6.  Essential hypertension.  The patient will continue on Norvasc.  7.  Hypothyroidism. The patient was continued on Synthroid.   DISCHARGE MEDICATIONS: 1.  Synthroid 75 mcg daily.  2.   Effexor 150 daily.  3.  Lipitor 10 mg daily.  4.  Norvasc 10 mg daily.  5.  Calcium 1 tablet p.o. b.i.d.  6.  Gabapentin 100 mg t.i.d.  7.  Metoprolol 50 mg daily.  8.  Effexor 75 mg daily.  9. Prednisone taper starting at 40 mg for 7 days, taper x 10 mg every 7 days.  10.. Flagyl 500 mg p.o. q.8 hours x9 days.  11. Ciprofloxacin 500 mg p.o. q. 12 hours x9 days.   DISCHARGE DIET: Low-sodium, soft diet until Saturday.   DISCHARGE ACTIVITY: As tolerated.   FOLLOWUP:  The patient will follow up in 2 weeks with DrVira Douglas.   PHYSICAL EXAMINATION:  VITAL SIGNS:  The patient is afebrile with a temperature 98, pulse 98, respirations 18, blood pressure 122/57, 98% on room air.  GENERAL: The patient is alert, oriented, not in acute distress.  CARDIOVASCULAR: Regular rate and rhythm. No murmurs, gallops, or rubs. PMI is not displaced at this point.  LUNGS: Clear to auscultation without crackles, rales, rhonchi, or wheezing. Normal to percussion.  ABDOMEN: Bowel sounds positive. Nontender, nondistended, no hepatosplenomegaly, no rebound or guarding.  EXTREMITIES: No clubbing, cyanosis or edema.  NEUROLOGIC:   Cranial nerves II through XII are intact.  The patient was stable for discharge.     TIME SPENT: Approximately 45 minutes     ____________________________ Dorri Ozturk P. Benjie Karvonen, MD spm:jp D: 06/23/2014 11:57:16 ET T: 06/23/2014 15:45:20 ET JOB#: [id number]  cc: Tyrrell Stephens P. Benjie Karvonen, MD, <Dictator> Manya Silvas, MD Donell Beers Eymi Lipuma MD ELECTRONICALLY  SIGNED 06/23/2014 20:59

## 2014-12-15 NOTE — Consult Note (Signed)
PATIENT NAME:  [name] Douglas, [name] Douglas MR#:  [id number] DATE OF BIRTH:  [birth year]  DATE OF CONSULTATION:  08/03/2014  REFERRING PHYSICIAN:   Juluis Mire, MD  CONSULTING PHYSICIAN:  Arther Dames, MD  REASON FOR CONSULTATION:  Diarrhea, Clostridium difficile.  HISTORY OF PRESENT ILLNESS:  [name] Douglas is a 74 year old female with a past medical history notable for COPD, who is admitted to the hospital, currently, for a fall that she sustained at home. [name] Douglas tells me that prior to this current presentation, she actually was seen in the Emergency Room the day prior for severe diarrhea, weakness, fatigue, overall feeling lousy. She had laboratory studies checked and was actually sent home for an attempted outpatient management. However, shortly after arriving home, she had a fall and re-presented to the Emergency Room.   [name] Douglas reports that she is moving her bowels about 7 to 10 times a day, watery. No blood in the stools. She is moving her bowels several times overnight, also. She is currently on vancomycin 500 mg q. 6 hours and has seen little improvement over the week that she has been on this medication. She has not had a course of Flagyl to this point for the Clostridium difficile.   During this hospital admission, she was noted to be tachycardic. She did have Clostridium difficile testing performed, which was negative for the toxins, but positive PCR for the Clostridium difficile organism and positive [place] antigen.    She did have a CT scan of the abdomen and pelvis, which showed colonic wall thickening.   PAST MEDICAL HISTORY:  1.  COPD. 2.  Hyperlipidemia.  3.  Hypertension.  5.  Hypothyroidism.  6.  Osteoporosis.   PAST SURGICAL HISTORY: She has had a left knee replacement, tubal ligation, hysterectomy, hemorrhoidectomy, rotator cuff repair.   ALLERGIES:  NKDA.    HOME MEDICATIONS:  Tramadol, amlodipine, Bentyl, calcium, Effexor, gabapentin, levothyroxine, Lipitor, metoprolol,  vancomycin, Advair, and Spiriva.   SOCIAL HISTORY:  She is an ongoing smoker. No alcohol.   FAMILY HISTORY:  No family history of GI malignancy.  REVIEW OF SYSTEMS:  A 10-system review was conducted and is negative, except as stated in the HPI.   PHYSICAL EXAMINATION:  VITAL SIGNS:  Her temperature is 98.3, pulse is 105, respirations are 18, blood pressure 119/68. Her orthostatics were strongly positive with a lying blood pressure of 149/66 and a resting of 95/53, pulse oxygen 98% on room air.  GENERAL: Alert and oriented times 4.  No acute distress. Appears stated age. HEENT: Normocephalic/atraumatic. Extraocular movements are intact. Anicteric. NECK: Soft, supple. JVP appears normal. No adenopathy. CHEST: Clear to auscultation. No wheeze or crackle. Respirations unlabored. HEART: Regular. No murmur, rub, or gallop.  Normal S1 and S2. ABDOMEN: Soft, nontender, nondistended.  Normal active bowel sounds in all four quadrants.  No organomegaly. No masses EXTREMITIES: No swelling, well perfused. SKIN: No rash or lesion. Skin color, texture, turgor normal. NEUROLOGICAL: Grossly intact. PSYCHIATRIC: Normal tone and affect. MUSCULOSKELETAL: No joint swelling or erythema.    LABORATORY DATA:  Basic metabolic panel is normal. Sodium 140, potassium 3.9, BUN 5, Creatinine 1.09.  Lipase is normal at 50.  Liver enzymes are normal. Albumin is low at 2.5. White count 5, hemoglobin 11, platelets are 349. Her INR is 1.0. Her Clostridium difficile toxins are negative, but her [facility] and organism PCR are positive.   RADIOLOGICAL DATA:  CT scan:  Did show colonic wall thickening in the left colon.  ASSESSMENT AND  PLAN;  Acute diarrhea: Unfortunately, she has not had any improvement on high-dose p.o. vancomycin. She continues to have frequent stooling and dehydration. Since she has been on therapy for a week with little response, I do think we will need to adjust the treatment course at this time. The possible  options we discussed were adding Flagyl versus changing to Dificid versus a fecal transplant.   I will need to discuss these options with her primary gastroenterologist, Dr. Vira Agar; and together, we will come up with a treatment plan at this point.   Thank you for this consult   ____________________________ Arther Dames, MD mr:mw D: 08/03/2014 15:10:00 ET T: 08/03/2014 15:23:08 ET JOB#: [id number]  cc: Arther Dames, MD, <Dictator> Mellody Life MD ELECTRONICALLY SIGNED 08/23/2014 14:19

## 2015-01-26 DIAGNOSIS — A047 Enterocolitis due to Clostridium difficile: Secondary | ICD-10-CM | POA: Diagnosis not present

## 2015-01-26 DIAGNOSIS — D490 Neoplasm of unspecified behavior of digestive system: Secondary | ICD-10-CM | POA: Diagnosis not present

## 2015-03-04 DIAGNOSIS — A047 Enterocolitis due to Clostridium difficile: Secondary | ICD-10-CM | POA: Diagnosis not present

## 2015-03-07 DIAGNOSIS — K2970 Gastritis, unspecified, without bleeding: Secondary | ICD-10-CM | POA: Diagnosis not present

## 2015-03-07 DIAGNOSIS — A047 Enterocolitis due to Clostridium difficile: Secondary | ICD-10-CM | POA: Diagnosis not present

## 2015-03-07 DIAGNOSIS — K2900 Acute gastritis without bleeding: Secondary | ICD-10-CM | POA: Diagnosis not present

## 2015-03-07 DIAGNOSIS — K2940 Chronic atrophic gastritis without bleeding: Secondary | ICD-10-CM | POA: Diagnosis not present

## 2015-03-07 DIAGNOSIS — K2950 Unspecified chronic gastritis without bleeding: Secondary | ICD-10-CM | POA: Diagnosis not present

## 2015-03-07 DIAGNOSIS — K317 Polyp of stomach and duodenum: Secondary | ICD-10-CM | POA: Diagnosis not present

## 2015-06-09 DIAGNOSIS — J449 Chronic obstructive pulmonary disease, unspecified: Secondary | ICD-10-CM | POA: Diagnosis not present

## 2015-06-09 DIAGNOSIS — I10 Essential (primary) hypertension: Secondary | ICD-10-CM | POA: Diagnosis not present

## 2015-06-09 DIAGNOSIS — M545 Low back pain: Secondary | ICD-10-CM | POA: Diagnosis not present

## 2015-06-09 DIAGNOSIS — K50119 Crohn's disease of large intestine with unspecified complications: Secondary | ICD-10-CM | POA: Diagnosis not present

## 2015-06-09 DIAGNOSIS — R000 Tachycardia, unspecified: Secondary | ICD-10-CM | POA: Diagnosis not present

## 2015-06-09 DIAGNOSIS — E782 Mixed hyperlipidemia: Secondary | ICD-10-CM | POA: Diagnosis not present

## 2015-06-09 DIAGNOSIS — Z23 Encounter for immunization: Secondary | ICD-10-CM | POA: Diagnosis not present

## 2015-06-09 DIAGNOSIS — F17210 Nicotine dependence, cigarettes, uncomplicated: Secondary | ICD-10-CM | POA: Diagnosis not present

## 2015-07-05 DIAGNOSIS — E559 Vitamin D deficiency, unspecified: Secondary | ICD-10-CM | POA: Diagnosis not present

## 2015-07-05 DIAGNOSIS — E039 Hypothyroidism, unspecified: Secondary | ICD-10-CM | POA: Diagnosis not present

## 2015-07-05 DIAGNOSIS — I10 Essential (primary) hypertension: Secondary | ICD-10-CM | POA: Diagnosis not present

## 2015-07-05 DIAGNOSIS — Z0001 Encounter for general adult medical examination with abnormal findings: Secondary | ICD-10-CM | POA: Diagnosis not present

## 2015-08-01 DIAGNOSIS — E782 Mixed hyperlipidemia: Secondary | ICD-10-CM | POA: Diagnosis not present

## 2015-08-01 DIAGNOSIS — I10 Essential (primary) hypertension: Secondary | ICD-10-CM | POA: Diagnosis not present

## 2015-08-01 DIAGNOSIS — F17210 Nicotine dependence, cigarettes, uncomplicated: Secondary | ICD-10-CM | POA: Diagnosis not present

## 2015-08-01 DIAGNOSIS — Z0001 Encounter for general adult medical examination with abnormal findings: Secondary | ICD-10-CM | POA: Diagnosis not present

## 2015-08-01 DIAGNOSIS — J449 Chronic obstructive pulmonary disease, unspecified: Secondary | ICD-10-CM | POA: Diagnosis not present

## 2015-08-01 DIAGNOSIS — F411 Generalized anxiety disorder: Secondary | ICD-10-CM | POA: Diagnosis not present

## 2015-11-29 DIAGNOSIS — F17210 Nicotine dependence, cigarettes, uncomplicated: Secondary | ICD-10-CM | POA: Diagnosis not present

## 2015-11-29 DIAGNOSIS — I10 Essential (primary) hypertension: Secondary | ICD-10-CM | POA: Diagnosis not present

## 2015-11-29 DIAGNOSIS — E039 Hypothyroidism, unspecified: Secondary | ICD-10-CM | POA: Diagnosis not present

## 2015-11-29 DIAGNOSIS — J449 Chronic obstructive pulmonary disease, unspecified: Secondary | ICD-10-CM | POA: Diagnosis not present

## 2015-11-29 DIAGNOSIS — H60513 Acute actinic otitis externa, bilateral: Secondary | ICD-10-CM | POA: Diagnosis not present

## 2016-01-31 DIAGNOSIS — R000 Tachycardia, unspecified: Secondary | ICD-10-CM | POA: Diagnosis not present

## 2016-01-31 DIAGNOSIS — J309 Allergic rhinitis, unspecified: Secondary | ICD-10-CM | POA: Diagnosis not present

## 2016-01-31 DIAGNOSIS — E039 Hypothyroidism, unspecified: Secondary | ICD-10-CM | POA: Diagnosis not present

## 2016-01-31 DIAGNOSIS — I10 Essential (primary) hypertension: Secondary | ICD-10-CM | POA: Diagnosis not present

## 2016-01-31 DIAGNOSIS — E782 Mixed hyperlipidemia: Secondary | ICD-10-CM | POA: Diagnosis not present

## 2016-01-31 DIAGNOSIS — F17210 Nicotine dependence, cigarettes, uncomplicated: Secondary | ICD-10-CM | POA: Diagnosis not present

## 2016-01-31 DIAGNOSIS — F411 Generalized anxiety disorder: Secondary | ICD-10-CM | POA: Diagnosis not present

## 2016-01-31 DIAGNOSIS — J449 Chronic obstructive pulmonary disease, unspecified: Secondary | ICD-10-CM | POA: Diagnosis not present

## 2016-02-19 DIAGNOSIS — Z96652 Presence of left artificial knee joint: Secondary | ICD-10-CM | POA: Diagnosis not present

## 2016-02-19 DIAGNOSIS — R609 Edema, unspecified: Secondary | ICD-10-CM | POA: Diagnosis not present

## 2016-02-19 DIAGNOSIS — M25461 Effusion, right knee: Secondary | ICD-10-CM | POA: Diagnosis not present

## 2016-02-19 DIAGNOSIS — M25561 Pain in right knee: Secondary | ICD-10-CM | POA: Diagnosis not present

## 2016-02-19 DIAGNOSIS — I10 Essential (primary) hypertension: Secondary | ICD-10-CM | POA: Diagnosis not present

## 2016-02-19 DIAGNOSIS — Z9181 History of falling: Secondary | ICD-10-CM | POA: Diagnosis not present

## 2016-03-06 DIAGNOSIS — M25561 Pain in right knee: Secondary | ICD-10-CM | POA: Diagnosis not present

## 2016-03-15 ENCOUNTER — Other Ambulatory Visit: Payer: Self-pay | Admitting: Physician Assistant

## 2016-03-15 DIAGNOSIS — M2391 Unspecified internal derangement of right knee: Secondary | ICD-10-CM

## 2016-03-15 DIAGNOSIS — M25561 Pain in right knee: Secondary | ICD-10-CM

## 2016-03-19 ENCOUNTER — Ambulatory Visit
Admission: RE | Admit: 2016-03-19 | Discharge: 2016-03-19 | Disposition: A | Discharge status: Home / Self Care | Payer: Medicare Other | Source: Ambulatory Visit | Attending: Physician Assistant | Admitting: Physician Assistant

## 2016-03-19 DIAGNOSIS — M25461 Effusion, right knee: Secondary | ICD-10-CM | POA: Insufficient documentation

## 2016-03-19 DIAGNOSIS — M7121 Synovial cyst of popliteal space [Baker], right knee: Secondary | ICD-10-CM | POA: Insufficient documentation

## 2016-03-19 DIAGNOSIS — R938 Abnormal findings on diagnostic imaging of other specified body structures: Secondary | ICD-10-CM | POA: Diagnosis not present

## 2016-03-19 DIAGNOSIS — M23221 Derangement of posterior horn of medial meniscus due to old tear or injury, right knee: Secondary | ICD-10-CM | POA: Diagnosis not present

## 2016-03-19 DIAGNOSIS — M2391 Unspecified internal derangement of right knee: Secondary | ICD-10-CM

## 2016-03-19 DIAGNOSIS — M7051 Other bursitis of knee, right knee: Secondary | ICD-10-CM | POA: Insufficient documentation

## 2016-03-19 DIAGNOSIS — M25561 Pain in right knee: Secondary | ICD-10-CM | POA: Diagnosis not present

## 2016-03-21 DIAGNOSIS — S82131A Displaced fracture of medial condyle of right tibia, initial encounter for closed fracture: Secondary | ICD-10-CM | POA: Diagnosis not present

## 2016-03-21 DIAGNOSIS — S83231A Complex tear of medial meniscus, current injury, right knee, initial encounter: Secondary | ICD-10-CM | POA: Diagnosis not present

## 2016-04-10 ENCOUNTER — Encounter
Admission: RE | Admit: 2016-04-10 | Discharge: 2016-04-10 | Disposition: A | Discharge status: Home / Self Care | Payer: Medicare Other | Source: Ambulatory Visit | Attending: Orthopedic Surgery | Admitting: Orthopedic Surgery

## 2016-04-10 DIAGNOSIS — Z01812 Encounter for preprocedural laboratory examination: Secondary | ICD-10-CM | POA: Insufficient documentation

## 2016-04-10 DIAGNOSIS — S83231A Complex tear of medial meniscus, current injury, right knee, initial encounter: Secondary | ICD-10-CM | POA: Diagnosis not present

## 2016-04-10 DIAGNOSIS — Z01810 Encounter for preprocedural cardiovascular examination: Secondary | ICD-10-CM | POA: Insufficient documentation

## 2016-04-10 DIAGNOSIS — X58XXXA Exposure to other specified factors, initial encounter: Secondary | ICD-10-CM | POA: Insufficient documentation

## 2016-04-10 DIAGNOSIS — I10 Essential (primary) hypertension: Secondary | ICD-10-CM | POA: Diagnosis not present

## 2016-04-10 DIAGNOSIS — S82141A Displaced bicondylar fracture of right tibia, initial encounter for closed fracture: Secondary | ICD-10-CM | POA: Insufficient documentation

## 2016-04-10 HISTORY — DX: Reserved for inherently not codable concepts without codable children: IMO0001

## 2016-04-10 HISTORY — DX: Anxiety disorder, unspecified: F41.9

## 2016-04-10 HISTORY — DX: Unspecified asthma, uncomplicated: J45.909

## 2016-04-10 HISTORY — DX: Gastro-esophageal reflux disease without esophagitis: K21.9

## 2016-04-10 HISTORY — DX: Diverticulosis of intestine, part unspecified, without perforation or abscess without bleeding: K57.90

## 2016-04-10 LAB — DIFFERENTIAL
Basophils Absolute: 0.1 10*3/uL (ref 0–0.1)
Basophils Relative: 1 %
Eosinophils Absolute: 0.3 10*3/uL (ref 0–0.7)
Eosinophils Relative: 3 %
Lymphocytes Relative: 22 %
Lymphs Abs: 2.2 10*3/uL (ref 1.0–3.6)
Monocytes Absolute: 0.7 10*3/uL (ref 0.2–0.9)
Monocytes Relative: 7 %
Neutro Abs: 6.7 10*3/uL — ABNORMAL HIGH (ref 1.4–6.5)
Neutrophils Relative %: 67 %

## 2016-04-10 LAB — CBC
HCT: 36.6 % (ref 35.0–47.0)
Hemoglobin: 12.5 g/dL (ref 12.0–16.0)
MCH: 28.6 pg (ref 26.0–34.0)
MCHC: 34.1 g/dL (ref 32.0–36.0)
MCV: 84 fL (ref 80.0–100.0)
Platelets: 245 10*3/uL (ref 150–440)
RBC: 4.35 MIL/uL (ref 3.80–5.20)
RDW: 15.1 % — ABNORMAL HIGH (ref 11.5–14.5)
WBC: 10.1 10*3/uL (ref 3.6–11.0)

## 2016-04-10 NOTE — Patient Instructions (Signed)
  Your procedure is scheduled [id number] 31, 2017 (Thursday) Report to Same Day Surgery 2nd floor [facility] To find out your arrival time please call [phone] between 1PM - 3PM on April 18, 2016 (Wednesday)  Remember: Instructions that are not followed completely may result in serious medical risk, up to and including death, or upon the discretion of your surgeon and anesthesiologist your surgery may need to be rescheduled.    _x___ 1. Do not eat food or drink liquids after midnight. No gum chewing or hard candies.     _x___ 2. No Alcohol for 24 hours before or after surgery.   _x___3. No Smoking for 24 prior to surgery.   ___  4. Bring all medications with you on the day of surgery if instructed.    _x__ 5. Notify your doctor if there is any change in your medical condition     (cold, fever, infections).     Do not wear jewelry, make-up, hairpins, clips or nail polish.  Do not wear lotions, powders, or perfumes. You may wear deodorant.  Do not shave 48 hours prior to surgery. Men may shave face and neck.  Do not bring valuables to the hospital.    [facility] is not responsible for any belongings or valuables.               Contacts, dentures or bridgework may not be worn into surgery.  Leave your suitcase in the car. After surgery it may be brought to your room.  For patients admitted to the hospital, discharge time is determined by your treatment team.   Patients discharged the day of surgery will not be allowed to drive home.    Please read over the following fact sheets that you were given:   [facility] Preparing for Surgery and or MRSA Information   _x___ Take these medicines the morning of surgery with A SIP OF WATER:    1. GABAPENTIN  2.  3.  4.  5.  6.  ____ Fleet Enema (as directed)   _x___ Use CHG Soap or sage wipes as directed on instruction sheet   _x___ Use inhalers on the day of surgery and bring to hospital day of surgery (USE ALBUTEROL NEBULIZER  AND [place])  ____ Stop metformin 2 days prior to surgery    ____ Take 1/2 of usual insulin dose the night before surgery and none on the morning of  surgery         _x___ Stop aspirin or coumadin, or plavix (NO ASPIRIN)  _x__ Stop Anti-inflammatories such as Advil, Aleve, Ibuprofen, Motrin, Naproxen,          Naprosyn, Goodies powders or aspirin products. Ok to take Tylenol. (May take Tramadol  for pain if needed)   ____ Stop supplements until after surgery.    ____ Bring C-Pap to the hospital.

## 2016-04-19 ENCOUNTER — Ambulatory Visit: Payer: Medicare Other | Admitting: Anesthesiology

## 2016-04-19 ENCOUNTER — Encounter
Admission: RE | Disposition: A | Discharge status: Home / Self Care | Payer: Self-pay | Source: Ambulatory Visit | Attending: Orthopedic Surgery

## 2016-04-19 ENCOUNTER — Ambulatory Visit
Admission: RE | Admit: 2016-04-19 | Discharge: 2016-04-19 | Disposition: A | Discharge status: Home / Self Care | Payer: Medicare Other | Source: Ambulatory Visit | Attending: Orthopedic Surgery | Admitting: Orthopedic Surgery

## 2016-04-19 ENCOUNTER — Ambulatory Visit: Payer: Medicare Other

## 2016-04-19 DIAGNOSIS — M2391 Unspecified internal derangement of right knee: Secondary | ICD-10-CM | POA: Diagnosis not present

## 2016-04-19 DIAGNOSIS — Z807 Family history of other malignant neoplasms of lymphoid, hematopoietic and related tissues: Secondary | ICD-10-CM | POA: Insufficient documentation

## 2016-04-19 DIAGNOSIS — J449 Chronic obstructive pulmonary disease, unspecified: Secondary | ICD-10-CM | POA: Insufficient documentation

## 2016-04-19 DIAGNOSIS — Z888 Allergy status to other drugs, medicaments and biological substances status: Secondary | ICD-10-CM | POA: Diagnosis not present

## 2016-04-19 DIAGNOSIS — S83231A Complex tear of medial meniscus, current injury, right knee, initial encounter: Secondary | ICD-10-CM | POA: Diagnosis not present

## 2016-04-19 DIAGNOSIS — E039 Hypothyroidism, unspecified: Secondary | ICD-10-CM | POA: Diagnosis not present

## 2016-04-19 DIAGNOSIS — Z96659 Presence of unspecified artificial knee joint: Secondary | ICD-10-CM | POA: Insufficient documentation

## 2016-04-19 DIAGNOSIS — M23221 Derangement of posterior horn of medial meniscus due to old tear or injury, right knee: Secondary | ICD-10-CM | POA: Insufficient documentation

## 2016-04-19 DIAGNOSIS — Z90710 Acquired absence of both cervix and uterus: Secondary | ICD-10-CM | POA: Diagnosis not present

## 2016-04-19 DIAGNOSIS — Z825 Family history of asthma and other chronic lower respiratory diseases: Secondary | ICD-10-CM | POA: Insufficient documentation

## 2016-04-19 DIAGNOSIS — Z79899 Other long term (current) drug therapy: Secondary | ICD-10-CM | POA: Diagnosis not present

## 2016-04-19 DIAGNOSIS — Z833 Family history of diabetes mellitus: Secondary | ICD-10-CM | POA: Diagnosis not present

## 2016-04-19 DIAGNOSIS — S82141A Displaced bicondylar fracture of right tibia, initial encounter for closed fracture: Secondary | ICD-10-CM | POA: Diagnosis not present

## 2016-04-19 DIAGNOSIS — Z801 Family history of malignant neoplasm of trachea, bronchus and lung: Secondary | ICD-10-CM | POA: Insufficient documentation

## 2016-04-19 DIAGNOSIS — F419 Anxiety disorder, unspecified: Secondary | ICD-10-CM | POA: Insufficient documentation

## 2016-04-19 DIAGNOSIS — F17200 Nicotine dependence, unspecified, uncomplicated: Secondary | ICD-10-CM | POA: Diagnosis not present

## 2016-04-19 DIAGNOSIS — Z419 Encounter for procedure for purposes other than remedying health state, unspecified: Secondary | ICD-10-CM

## 2016-04-19 DIAGNOSIS — S83131A Medial subluxation of proximal end of tibia, right knee, initial encounter: Secondary | ICD-10-CM | POA: Diagnosis not present

## 2016-04-19 DIAGNOSIS — I10 Essential (primary) hypertension: Secondary | ICD-10-CM | POA: Insufficient documentation

## 2016-04-19 DIAGNOSIS — W19XXXA Unspecified fall, initial encounter: Secondary | ICD-10-CM | POA: Insufficient documentation

## 2016-04-19 DIAGNOSIS — Z8249 Family history of ischemic heart disease and other diseases of the circulatory system: Secondary | ICD-10-CM | POA: Insufficient documentation

## 2016-04-19 DIAGNOSIS — E7800 Pure hypercholesterolemia, unspecified: Secondary | ICD-10-CM | POA: Diagnosis not present

## 2016-04-19 DIAGNOSIS — S83241A Other tear of medial meniscus, current injury, right knee, initial encounter: Secondary | ICD-10-CM | POA: Diagnosis not present

## 2016-04-19 HISTORY — PX: KNEE ARTHROSCOPY WITH SUBCHONDROPLASTY: SHX6732

## 2016-04-19 SURGERY — ARTHROSCOPY, KNEE, WITH SUBCHONDROPLASTY
Anesthesia: General | Site: Knee | Laterality: Right | Wound class: Clean

## 2016-04-19 MED ORDER — CEFAZOLIN SODIUM-DEXTROSE 2-4 GM/100ML-% IV SOLN: 2.0000 g | Freq: Once | INTRAVENOUS | Status: DC

## 2016-04-19 MED ORDER — CEFAZOLIN IN D5W 1 GM/50ML IV SOLN
INTRAVENOUS | Status: DC | PRN
  Administered 2016-04-19: 2 g via INTRAVENOUS

## 2016-04-19 MED ORDER — ONDANSETRON HCL 4 MG/2ML IJ SOLN
INTRAMUSCULAR | Status: DC | PRN
  Administered 2016-04-19: 4 mg via INTRAVENOUS

## 2016-04-19 MED ORDER — GLYCOPYRROLATE 0.2 MG/ML IJ SOLN
INTRAMUSCULAR | Status: DC | PRN
  Administered 2016-04-19: 0.2 mg via INTRAVENOUS

## 2016-04-19 MED ORDER — FENTANYL CITRATE (PF) 100 MCG/2ML IJ SOLN
INTRAMUSCULAR | Status: DC | PRN
  Administered 2016-04-19 (×3): 25 ug via INTRAVENOUS
  Administered 2016-04-19: 50 ug via INTRAVENOUS
  Administered 2016-04-19: 25 ug via INTRAVENOUS

## 2016-04-19 MED ORDER — OXYCODONE HCL 5 MG PO TABS: 5.0000 mg | ORAL_TABLET | ORAL | 0 refills | Status: DC | PRN

## 2016-04-19 MED ORDER — DEXAMETHASONE SODIUM PHOSPHATE 10 MG/ML IJ SOLN
INTRAMUSCULAR | Status: DC | PRN
  Administered 2016-04-19: 10 mg via INTRAVENOUS

## 2016-04-19 MED ORDER — BUPIVACAINE-EPINEPHRINE (PF) 0.5% -1:200000 IJ SOLN
INTRAMUSCULAR | Status: DC | PRN
  Administered 2016-04-19: 30 mL via PERINEURAL

## 2016-04-19 MED ORDER — PROPOFOL 10 MG/ML IV BOLUS
INTRAVENOUS | Status: DC | PRN
  Administered 2016-04-19: 100 mg via INTRAVENOUS

## 2016-04-19 MED ORDER — FAMOTIDINE 20 MG PO TABS
20.0000 mg | ORAL_TABLET | Freq: Once | ORAL | Status: AC
  Administered 2016-04-19: 20 mg via ORAL

## 2016-04-19 MED ORDER — IPRATROPIUM-ALBUTEROL 0.5-2.5 (3) MG/3ML IN SOLN
3.0000 mL | Freq: Once | RESPIRATORY_TRACT | Status: AC
  Administered 2016-04-19: 3 mL via RESPIRATORY_TRACT

## 2016-04-19 MED ORDER — LACTATED RINGERS IV SOLN
INTRAVENOUS | Status: DC
  Administered 2016-04-19: 08:00:00 via INTRAVENOUS

## 2016-04-19 MED ORDER — FENTANYL CITRATE (PF) 100 MCG/2ML IJ SOLN: 25.0000 ug | INTRAMUSCULAR | Status: DC | PRN

## 2016-04-19 MED ORDER — IPRATROPIUM-ALBUTEROL 0.5-2.5 (3) MG/3ML IN SOLN
RESPIRATORY_TRACT | Status: AC
  Administered 2016-04-19: 3 mL via RESPIRATORY_TRACT
  Filled 2016-04-19: qty 3

## 2016-04-19 MED ORDER — LIDOCAINE HCL (CARDIAC) 20 MG/ML IV SOLN
INTRAVENOUS | Status: DC | PRN
  Administered 2016-04-19: 100 mg via INTRAVENOUS

## 2016-04-19 MED ORDER — DEXMEDETOMIDINE HCL 200 MCG/2ML IV SOLN
INTRAVENOUS | Status: DC | PRN
  Administered 2016-04-19: 8 ug via INTRAVENOUS

## 2016-04-19 MED ORDER — OXYCODONE HCL 5 MG PO TABS
ORAL_TABLET | ORAL | Status: AC
  Administered 2016-04-19: 5 mg
  Filled 2016-04-19: qty 1

## 2016-04-19 MED ORDER — ONDANSETRON HCL 4 MG/2ML IJ SOLN: 4.0000 mg | Freq: Once | INTRAMUSCULAR | Status: DC | PRN

## 2016-04-19 MED ORDER — PHENYLEPHRINE HCL 10 MG/ML IJ SOLN
INTRAMUSCULAR | Status: DC | PRN
  Administered 2016-04-19 (×2): 100 ug via INTRAVENOUS

## 2016-04-19 SURGICAL SUPPLY — 34 items
BANDAGE ACE 4X5 VEL STRL LF (GAUZE/BANDAGES/DRESSINGS) ×2 IMPLANT
BANDAGE ELASTIC 4 LF NS (GAUZE/BANDAGES/DRESSINGS) ×2 IMPLANT
BLADE FULL RADIUS 3.5 (BLADE) ×2 IMPLANT
BLADE INCISOR PLUS 4.5 (BLADE) ×2 IMPLANT
BLADE SHAVER 4.5 DBL SERAT CV (CUTTER) ×2 IMPLANT
BLADE SHAVER 4.5X7 STR FR (MISCELLANEOUS) ×2 IMPLANT
CHLORAPREP W/TINT 26ML (MISCELLANEOUS) ×2 IMPLANT
CUFF TOURN 24 STER (MISCELLANEOUS) IMPLANT
CUFF TOURN 30 STER DUAL PORT (MISCELLANEOUS) IMPLANT
CUFF TOURN SGL QUICK 34 (TOURNIQUET CUFF) ×1
CUFF TRNQT CYL 34X4X40X1 (TOURNIQUET CUFF) ×1 IMPLANT
CUTTER AGGRESSIVE+ 3.5 (CUTTER) ×2 IMPLANT
DRAPE C-ARM XRAY 36X54 (DRAPES) ×2 IMPLANT
DRAPE C-ARMOR (DRAPES) ×2 IMPLANT
GAUZE SPONGE 4X4 12PLY STRL (GAUZE/BANDAGES/DRESSINGS) ×2 IMPLANT
GLOVE SURG ORTHO 9.0 STRL STRW (GLOVE) ×2 IMPLANT
GOWN SRG 2XL LVL 4 RGLN SLV (GOWNS) ×1 IMPLANT
GOWN STRL NON-REIN 2XL LVL4 (GOWNS) ×1
GOWN STRL REUS W/ TWL LRG LVL3 (GOWN DISPOSABLE) ×1 IMPLANT
GOWN STRL REUS W/TWL LRG LVL3 (GOWN DISPOSABLE) ×1
IV LACTATED RINGER IRRG 3000ML (IV SOLUTION) ×3
IV LR IRRIG 3000ML ARTHROMATIC (IV SOLUTION) ×3 IMPLANT
KIT MIXER ACCUMIX (KITS) ×2 IMPLANT
KIT RM TURNOVER STRD PROC AR (KITS) ×2 IMPLANT
KNEE KIT SCP W/SIDE ACCUPORT (Joint) ×2 IMPLANT
MANIFOLD NEPTUNE II (INSTRUMENTS) ×2 IMPLANT
PACK ARTHROSCOPY KNEE (MISCELLANEOUS) ×2 IMPLANT
SET TUBE SUCT SHAVER OUTFL 24K (TUBING) ×2 IMPLANT
SET TUBE TIP INTRA-ARTICULAR (MISCELLANEOUS) ×2 IMPLANT
SUT ETHILON 4-0 (SUTURE) ×1
SUT ETHILON 4-0 FS2 18XMFL BLK (SUTURE) ×1
SUTURE ETHLN 4-0 FS2 18XMF BLK (SUTURE) ×1 IMPLANT
TUBING ARTHRO INFLOW-ONLY STRL (TUBING) ×2 IMPLANT
WAND HAND CNTRL MULTIVAC 50 (MISCELLANEOUS) ×2 IMPLANT

## 2016-04-19 NOTE — Anesthesia Procedure Notes (Signed)
Procedure Name: LMA Insertion Date/Time: 04/19/2016 9:03 AM Performed by: Marsh Dolly Pre-anesthesia Checklist: Patient identified, Patient being monitored, Timeout performed, Emergency Drugs available and Suction available Patient Re-evaluated:Patient Re-evaluated prior to inductionOxygen Delivery Method: Circle system utilized Preoxygenation: Pre-oxygenation with 100% oxygen Intubation Type: IV induction Ventilation: Mask ventilation without difficulty LMA: LMA inserted LMA Size: 3.5 Tube type: Oral Number of attempts: 1 Placement Confirmation: positive ETCO2 and breath sounds checked- equal and bilateral Tube secured with: Tape Dental Injury: Teeth and Oropharynx as per pre-operative assessment

## 2016-04-19 NOTE — Transfer of Care (Signed)
Immediate Anesthesia Transfer of Care Note  Patient: [name] Douglas  Procedure(s) Performed: Procedure(s): KNEE ARTHROSCOPY WITH SUBCHONDROPLASTY (Right)  Patient Location: PACU  Anesthesia Type:General  Level of Consciousness: awake, alert  and oriented  Airway & Oxygen Therapy: Patient Spontanous Breathing and Patient connected to face mask oxygen  Post-op Assessment: Report given to RN and Post -op Vital signs reviewed and stable  Post vital signs: Reviewed and stable  Last Vitals:  Vitals:   04/19/16 0753 04/19/16 1029  BP: (!) 101/49 (!) 126/51  Pulse: 84 77  Resp: 16 11  Temp: 36.4 C 36.4 C    Last Pain:  Vitals:   04/19/16 0753  TempSrc: Tympanic         Complications: No apparent anesthesia complications

## 2016-04-19 NOTE — Op Note (Signed)
04/19/2016  10:34 AM  PATIENT:  [name] Douglas  75 y.o. female  PRE-OPERATIVE DIAGNOSIS:  COMPLETE TEAR OF MEDIAL MENISCUS OF RIGHT KNEE,RIGHT MEDIAL TIBIAL PLATEAU FRACTURE  POST-OPERATIVE DIAGNOSIS:  COMPLETE TEAR OF MEDIAL MENISCUS OF RIGHT KNEE,RIGHT MEDIAL TIBIAL PLATEAU FRACTURE  PROCEDURE:  Procedure(s): KNEE ARTHROSCOPY WITH SUBCHONDROPLASTY (Right) and partial medial meniscectomy  SURGEON: Laurene Footman, MD  ASSISTANTS: None  ANESTHESIA:   general  EBL:  Total I/O In: 800 [I.V.:800] Out: 5 [Blood:5]  BLOOD ADMINISTERED:none  DRAINS: none   LOCAL MEDICATIONS USED:  MARCAINE     SPECIMEN:  No Specimen  DISPOSITION OF SPECIMEN:  N/A  COUNTS:  YES  TOURNIQUET:   35 minutes at 300 mmHg  IMPLANTS:   DICTATION: .Dragon Dictation patient brought the operating room and after adequate anesthesia was obtained the right leg was prepped and draped in sterile fashion with the arthroscopic leg holder and tourniquet applied. After patient identification and timeout procedures were completed, an inferior lateral portal was made and the scope was introduced. Initial inspection revealed very minimal patellofemoral degenerative change gutters were free of any loose bodies. Corral medial compartment there is significant degenerative changes to the medial compartment particularly posterior medially on the tibial condyle with partial-thickness loss of the articular cartilage and extensive tear of the posterior horn visible a without probing. The anterior cruciate ligament was intact and lateral compartment was relatively normal with almost normal articular cartilage. There was some synovitis around the notch and this is debrided for better visualization. An inferior medial portal was made and on probing there was a complex tear involving most the posterior third and going into the middle third with horizontal and vertical components. This was debrided with meniscal punch and shaver and  ArthriCare wand to get back to a stable margin. The knee was then straightened and C-arm brought in with tourniquet raised during the procedure secondary some bleeding from the synovium. With C-arm localizing a small incision was made and the subchondral plasty pin was placed directly in the subchondral bone on the medial tibial condyle not not going past the tibial spine medially. After placing this the appropriate location 5 cc of the bone substitute was mixed and inserted anterior posteriorly and inferiorly with good fill. There is minimal extravasation into the joint and this was subsequently removed with use of the shaver clearing out the joint. After the 5 cc and then placed under fluoroscopic guidance it was allowed to set for proximally 10 minutes and the introduction device was removed. AP and lateral permanent images were obtained showing the subchondral plasty following this the arthroscope was reintroduced and the extravasated material in the medial compartment was removed. The knee was thoroughly irrigated until clear and all argentation withdrawn. The wounds are closed with simple interrupted 4-0 nylon skin closure with 30 cc of half percent Sensorcaine infiltrated around the incision for postop analgesia. Xeroform 4 x 4 web roll and Ace wrap applied  PLAN OF CARE: Discharge to home after PACU  PATIENT DISPOSITION:  PACU - hemodynamically stable.

## 2016-04-19 NOTE — Anesthesia Postprocedure Evaluation (Signed)
Anesthesia Post Note  Patient: [name] Douglas  Procedure(s) Performed: Procedure(s) (LRB): KNEE ARTHROSCOPY WITH SUBCHONDROPLASTY, PARTIAL MENISCECTOMY (Right)  Patient location during evaluation: PACU Anesthesia Type: General Level of consciousness: awake and alert Pain management: pain level controlled Vital Signs Assessment: post-procedure vital signs reviewed and stable Respiratory status: spontaneous breathing and respiratory function stable Cardiovascular status: stable Anesthetic complications: no    Last Vitals:  Vitals:   04/19/16 0753 04/19/16 1029  BP: (!) 101/49 (!) 126/51  Pulse: 84 77  Resp: 16 11  Temp: 36.4 C 36.4 C    Last Pain:  Vitals:   04/19/16 0753  TempSrc: Tympanic                 Stamatia Masri K

## 2016-04-19 NOTE — H&P (Signed)
Reviewed paper H+P, will be scanned into chart. No changes noted.  

## 2016-04-19 NOTE — Discharge Instructions (Signed)
Remove Band-Aid on Sunday, covered 3 incisions with Band-Aids and reapply Ace wrap. Weightbearing as tolerated on right leg. Take aspirin 325 mg daily. Pain medicine as directed

## 2016-04-19 NOTE — Anesthesia Preprocedure Evaluation (Signed)
Anesthesia Evaluation  Patient identified by MRN, date of birth, ID band Patient awake    Reviewed: Allergy & Precautions, NPO status , Patient's Chart, lab work & pertinent test results  History of Anesthesia Complications Negative for: history of anesthetic complications (pt denies)  Airway Mallampati: III       Dental  (+) Upper Dentures, Lower Dentures   Pulmonary asthma , COPD,  COPD inhaler, Current Smoker,           Cardiovascular hypertension, Pt. on medications      Neuro/Psych negative neurological ROS     GI/Hepatic Neg liver ROS, neg GERD (pt denies)  ,  Endo/Other  Hypothyroidism   Renal/GU negative Renal ROS     Musculoskeletal   Abdominal   Peds  Hematology negative hematology ROS (+)   Anesthesia Other Findings   Reproductive/Obstetrics                             Anesthesia Physical Anesthesia Plan  ASA: II  Anesthesia Plan: General   Post-op Pain Management:    Induction: Intravenous  Airway Management Planned: LMA  Additional Equipment:   Intra-op Plan:   Post-operative Plan:   Informed Consent: I have reviewed the patients History and Physical, chart, labs and discussed the procedure including the risks, benefits and alternatives for the proposed anesthesia with the patient or authorized representative who has indicated his/her understanding and acceptance.     Plan Discussed with:   Anesthesia Plan Comments:         Anesthesia Quick Evaluation

## 2016-04-23 ENCOUNTER — Inpatient Hospital Stay
Admission: EM | Admit: 2016-04-23 | Discharge: 2016-04-25 | DRG: 390 | Disposition: A | Discharge status: Home / Self Care | Payer: Medicare Other | Attending: Internal Medicine | Admitting: Internal Medicine

## 2016-04-23 ENCOUNTER — Emergency Department: Payer: Medicare Other

## 2016-04-23 ENCOUNTER — Encounter: Payer: Self-pay | Admitting: Emergency Medicine

## 2016-04-23 DIAGNOSIS — J449 Chronic obstructive pulmonary disease, unspecified: Secondary | ICD-10-CM | POA: Diagnosis not present

## 2016-04-23 DIAGNOSIS — K598 Other specified functional intestinal disorders: Secondary | ICD-10-CM | POA: Diagnosis not present

## 2016-04-23 DIAGNOSIS — K219 Gastro-esophageal reflux disease without esophagitis: Secondary | ICD-10-CM | POA: Diagnosis present

## 2016-04-23 DIAGNOSIS — Z8249 Family history of ischemic heart disease and other diseases of the circulatory system: Secondary | ICD-10-CM

## 2016-04-23 DIAGNOSIS — F329 Major depressive disorder, single episode, unspecified: Secondary | ICD-10-CM | POA: Diagnosis present

## 2016-04-23 DIAGNOSIS — E876 Hypokalemia: Secondary | ICD-10-CM | POA: Diagnosis not present

## 2016-04-23 DIAGNOSIS — Z801 Family history of malignant neoplasm of trachea, bronchus and lung: Secondary | ICD-10-CM

## 2016-04-23 DIAGNOSIS — R1084 Generalized abdominal pain: Secondary | ICD-10-CM

## 2016-04-23 DIAGNOSIS — E039 Hypothyroidism, unspecified: Secondary | ICD-10-CM | POA: Diagnosis present

## 2016-04-23 DIAGNOSIS — I10 Essential (primary) hypertension: Secondary | ICD-10-CM | POA: Diagnosis not present

## 2016-04-23 DIAGNOSIS — R109 Unspecified abdominal pain: Secondary | ICD-10-CM

## 2016-04-23 DIAGNOSIS — K6389 Other specified diseases of intestine: Secondary | ICD-10-CM | POA: Diagnosis not present

## 2016-04-23 DIAGNOSIS — Z96652 Presence of left artificial knee joint: Secondary | ICD-10-CM | POA: Diagnosis present

## 2016-04-23 DIAGNOSIS — Z79899 Other long term (current) drug therapy: Secondary | ICD-10-CM

## 2016-04-23 DIAGNOSIS — K567 Ileus, unspecified: Secondary | ICD-10-CM | POA: Diagnosis not present

## 2016-04-23 DIAGNOSIS — F17210 Nicotine dependence, cigarettes, uncomplicated: Secondary | ICD-10-CM | POA: Diagnosis present

## 2016-04-23 DIAGNOSIS — T402X5A Adverse effect of other opioids, initial encounter: Secondary | ICD-10-CM | POA: Diagnosis present

## 2016-04-23 DIAGNOSIS — Y929 Unspecified place or not applicable: Secondary | ICD-10-CM

## 2016-04-23 DIAGNOSIS — Z9981 Dependence on supplemental oxygen: Secondary | ICD-10-CM | POA: Diagnosis not present

## 2016-04-23 DIAGNOSIS — R112 Nausea with vomiting, unspecified: Secondary | ICD-10-CM | POA: Diagnosis not present

## 2016-04-23 DIAGNOSIS — E7800 Pure hypercholesterolemia, unspecified: Secondary | ICD-10-CM | POA: Diagnosis present

## 2016-04-23 DIAGNOSIS — Y92009 Unspecified place in unspecified non-institutional (private) residence as the place of occurrence of the external cause: Secondary | ICD-10-CM

## 2016-04-23 DIAGNOSIS — Z833 Family history of diabetes mellitus: Secondary | ICD-10-CM

## 2016-04-23 DIAGNOSIS — J439 Emphysema, unspecified: Secondary | ICD-10-CM | POA: Diagnosis not present

## 2016-04-23 LAB — CBC WITH DIFFERENTIAL/PLATELET
Basophils Absolute: 0.1 10*3/uL (ref 0–0.1)
Basophils Relative: 1 %
Eosinophils Absolute: 0.1 10*3/uL (ref 0–0.7)
Eosinophils Relative: 1 %
HCT: 38.1 % (ref 35.0–47.0)
Hemoglobin: 13.1 g/dL (ref 12.0–16.0)
Lymphocytes Relative: 17 %
Lymphs Abs: 1.8 10*3/uL (ref 1.0–3.6)
MCH: 29 pg (ref 26.0–34.0)
MCHC: 34.4 g/dL (ref 32.0–36.0)
MCV: 84.3 fL (ref 80.0–100.0)
Monocytes Absolute: 0.8 10*3/uL (ref 0.2–0.9)
Monocytes Relative: 8 %
Neutro Abs: 8 10*3/uL — ABNORMAL HIGH (ref 1.4–6.5)
Neutrophils Relative %: 73 %
Platelets: 240 10*3/uL (ref 150–440)
RBC: 4.52 MIL/uL (ref 3.80–5.20)
RDW: 14.5 % (ref 11.5–14.5)
WBC: 10.8 10*3/uL (ref 3.6–11.0)

## 2016-04-23 LAB — COMPREHENSIVE METABOLIC PANEL
ALT: 18 U/L (ref 14–54)
AST: 22 U/L (ref 15–41)
Albumin: 4 g/dL (ref 3.5–5.0)
Alkaline Phosphatase: 89 U/L (ref 38–126)
Anion gap: 13 (ref 5–15)
BUN: 20 mg/dL (ref 6–20)
CO2: 24 mmol/L (ref 22–32)
Calcium: 9.2 mg/dL (ref 8.9–10.3)
Chloride: 100 mmol/L — ABNORMAL LOW (ref 101–111)
Creatinine, Ser: 0.75 mg/dL (ref 0.44–1.00)
GFR calc Af Amer: >60 mL/min (ref >60)
GFR calc non Af Amer: >60 mL/min (ref >60)
Glucose, Bld: 117 mg/dL — ABNORMAL HIGH (ref 65–99)
Potassium: 3.4 mmol/L — ABNORMAL LOW (ref 3.5–5.1)
Sodium: 137 mmol/L (ref 135–145)
Total Bilirubin: 1.1 mg/dL (ref 0.3–1.2)
Total Protein: 7.7 g/dL (ref 6.5–8.1)

## 2016-04-23 LAB — PROTIME-INR
INR: 1.01
Prothrombin Time: 13.3 seconds (ref 11.4–15.2)

## 2016-04-23 LAB — LACTIC ACID, PLASMA: Lactic Acid, Venous: 1.7 mmol/L (ref 0.5–1.9)

## 2016-04-23 LAB — TROPONIN I: Troponin I: <0.03 ng/mL (ref <0.03)

## 2016-04-23 MED ORDER — MORPHINE SULFATE (PF) 4 MG/ML IV SOLN
4.0000 mg | Freq: Once | INTRAVENOUS | Status: AC
  Administered 2016-04-23: 4 mg via INTRAVENOUS
  Filled 2016-04-23: qty 1

## 2016-04-23 MED ORDER — IOPAMIDOL (ISOVUE-300) INJECTION 61%
100.0000 mL | Freq: Once | INTRAVENOUS | Status: AC | PRN
  Administered 2016-04-23: 100 mL via INTRAVENOUS

## 2016-04-23 MED ORDER — IOPAMIDOL (ISOVUE-300) INJECTION 61%
30.0000 mL | Freq: Once | INTRAVENOUS | Status: AC
  Administered 2016-04-23: 15 mL via ORAL

## 2016-04-23 MED ORDER — MORPHINE SULFATE (PF) 4 MG/ML IV SOLN
4.0000 mg | Freq: Once | INTRAVENOUS | Status: AC
  Administered 2016-04-23: 4 mg via INTRAVENOUS

## 2016-04-23 MED ORDER — ONDANSETRON HCL 4 MG/2ML IJ SOLN
4.0000 mg | Freq: Once | INTRAMUSCULAR | Status: AC
  Administered 2016-04-23: 4 mg via INTRAVENOUS
  Filled 2016-04-23: qty 2

## 2016-04-23 MED ORDER — SODIUM CHLORIDE 0.9 % IV BOLUS (SEPSIS)
500.0000 mL | Freq: Once | INTRAVENOUS | Status: AC
Start: 2016-04-23 — End: 2016-04-23
  Administered 2016-04-23: 500 mL via INTRAVENOUS

## 2016-04-23 MED ORDER — ONDANSETRON HCL 4 MG/2ML IJ SOLN
4.0000 mg | Freq: Once | INTRAMUSCULAR | Status: AC
  Administered 2016-04-23: 4 mg via INTRAVENOUS

## 2016-04-23 MED ORDER — ONDANSETRON HCL 4 MG/2ML IJ SOLN
INTRAMUSCULAR | Status: AC
  Administered 2016-04-23: 4 mg via INTRAVENOUS
  Filled 2016-04-23: qty 2

## 2016-04-23 MED ORDER — MORPHINE SULFATE (PF) 4 MG/ML IV SOLN
INTRAVENOUS | Status: AC
  Administered 2016-04-23: 4 mg via INTRAVENOUS
  Filled 2016-04-23: qty 1

## 2016-04-23 NOTE — ED Notes (Signed)
CT notified that pt is finished drinking contrast.

## 2016-04-23 NOTE — ED Notes (Signed)
Pt attempted to give urine sample without success. Will let IV fluids infuse and try again.

## 2016-04-23 NOTE — ED Provider Notes (Signed)
[facility] Emergency Department Provider Note   ____________________________________________   First MD Initiated Contact with Patient 04/23/16 1948     (approximate)  I have reviewed the triage vital signs and the nursing notes.   HISTORY  Chief Complaint Abdominal Pain; Fever; Nausea; and Emesis    HPI [name] Douglas is a 75 y.o. female presents for evaluation states she's been nauseated, having mid abdominal pain, vomiting, and unable to eat anything for the last 24 hours. She reports anything she started he is, but she feels too nauseated. Also reports severe pain in the midabdomen, worsening throughout the day. Denies pain swelling or discomfort around the right knee where she had surgery. No numb cold tingling or weak arms or legs. She reports feeling very nauseated as though she has "chills".  Currently on Vicodin for pain after right knee arthroscopy   Past Medical History:  Diagnosis Date  . Anxiety   . Arthritis    "shoulders" (08/20/2014)  . Asthma   . Chronic lower back pain   . Complication of anesthesia    "agitated & restless after knee replacement in 2008"  . COPD (chronic obstructive pulmonary disease) ([place])   . DDD (degenerative disc disease), lumbar   . Depression   . Diverticulosis   . GERD (gastroesophageal reflux disease)   . High cholesterol   . Hypertension   . Hypothyroidism   . On home oxygen therapy    "2L at night" (08/20/2014)  . Shortness of breath dyspnea    with exertion    Patient Active Problem List   Diagnosis Date Noted  . Ileus ([place]) 04/24/2016  . Colitis due to Clostridium difficile   . Recurrent Clostridium difficile diarrhea   . Enteritis due to Clostridium difficile 08/22/2014  . Diarrhea 08/20/2014  . HTN (hypertension) 08/20/2014  . HLD (hyperlipidemia) 08/20/2014  . Hypothyroidism 08/20/2014  . Tobacco abuse 08/20/2014  . COPD (chronic obstructive pulmonary disease) ([place]) 08/20/2014  . Back  pain 08/20/2014    Past Surgical History:  Procedure Laterality Date  . CARDIAC CATHETERIZATION  ~ 2007  . EXCISIONAL HEMORRHOIDECTOMY  1960's  . EYE SURGERY Bilateral    Cataract extraction with IOL  . JOINT REPLACEMENT    . KNEE ARTHROSCOPY Left X 2 <2008  . KNEE ARTHROSCOPY WITH SUBCHONDROPLASTY Right 04/19/2016   Procedure: KNEE ARTHROSCOPY WITH SUBCHONDROPLASTY, PARTIAL MENISCECTOMY;  Surgeon: Hessie Knows, MD;  Location: [facility] ORS;  Service: Orthopedics;  Laterality: Right;  . SHOULDER OPEN ROTATOR CUFF REPAIR Right 2001  . TONSILLECTOMY  ~ 1950  . TOTAL KNEE ARTHROPLASTY Left 2008  . TUBAL LIGATION  1973  . VAGINAL HYSTERECTOMY  1974    Prior to Admission medications   Medication Sig Start Date End Date Taking? Authorizing Provider  albuterol (PROVENTIL HFA;VENTOLIN HFA) 108 (90 BASE) MCG/ACT inhaler Inhale 2 puffs into the lungs every 6 (six) hours as needed for wheezing or shortness of breath. 08/28/14   Ripudeep Krystal Eaton, MD  albuterol (PROVENTIL) (2.5 MG/3ML) 0.083% nebulizer solution Take 3 mLs (2.5 mg total) by nebulization 2 (two) times daily. Patient taking differently: Take 2.5 mg by nebulization as needed.  08/28/14   Ripudeep Krystal Eaton, MD  atorvastatin (LIPITOR) 10 MG tablet Take 10 mg by mouth every evening.    Historical Provider, MD  benzonatate (TESSALON) 200 MG capsule Take 1 capsule (200 mg total) by mouth 3 (three) times daily. For cough Patient not taking: Reported on 04/10/2016 08/28/14   Ripudeep K  Rai, MD  feeding supplement, ENSURE COMPLETE, (ENSURE COMPLETE) LIQD Take 237 mLs by mouth 2 (two) times daily between meals. Over the counter Patient not taking: Reported on 04/10/2016 08/28/14   Ripudeep Krystal Eaton, MD  gabapentin (NEURONTIN) 100 MG capsule Take 100 mg by mouth 3 (three) times daily.    Historical Provider, MD  HYDROcodone-homatropine (HYCODAN) 5-1.5 MG/5ML syrup Take 5 mLs by mouth every 4 (four) hours as needed for cough. Patient not taking: Reported on 04/10/2016  08/28/14   Ripudeep Krystal Eaton, MD  Lactobacillus (ACIDOPHILUS PO) Take by mouth.    Historical Provider, MD  levothyroxine (SYNTHROID, LEVOTHROID) 88 MCG tablet Take 88 mcg by mouth daily before breakfast.    Historical Provider, MD  metoprolol (LOPRESSOR) 50 MG tablet Take 50 mg by mouth every evening.     Historical Provider, MD  nicotine (NICODERM CQ - DOSED IN MG/24 HOURS) 21 mg/24hr patch Place 1 patch (21 mg total) onto the skin daily. Patient not taking: Reported on 04/10/2016 08/28/14   Ripudeep Krystal Eaton, MD  oxyCODONE (ROXICODONE) 5 MG immediate release tablet Take 1 tablet (5 mg total) by mouth every 4 (four) hours as needed for severe pain. 04/19/16   Hessie Knows, MD  tiotropium (SPIRIVA HANDIHALER) 18 MCG inhalation capsule Place 1 capsule (18 mcg total) into inhaler and inhale daily. 08/28/14   Ripudeep Krystal Eaton, MD  traMADol (ULTRAM) 50 MG tablet Take 50 mg by mouth 3 (three) times daily as needed.    Historical Provider, MD  vancomycin (VANCOCIN) 50 mg/mL oral solution Take 2.5 mLs (125 mg total) by mouth 4 (four) times daily. Please take Vancomycin taper as outlined below:  Start oral vancomycin 125MG  4 times a day x 14days, then taper to 125mg  twice a day  X 7days, then 125mg  daily for 7 days, then 125 mg every other day x 7days,  then 125mg  every 3 days x 14days, then OFF. Patient not taking: Reported on 04/10/2016 08/28/14   Ripudeep Krystal Eaton, MD  venlafaxine (EFFEXOR) 75 MG tablet Take 75 mg by mouth 3 (three) times daily with meals.    Historical Provider, MD  venlafaxine XR (EFFEXOR-XR) 150 MG 24 hr capsule Take 150 mg by mouth daily with breakfast.     Historical Provider, MD    Allergies Review of patient's allergies indicates no known allergies.  Family History  Problem Relation Age of Onset  . Diabetes Mother   . Hypertension Mother   . Lung cancer Mother   . Congestive Heart Failure Father   . Congestive Heart Failure Sister     Social History Social History  Substance Use  Topics  . Smoking status: Current Every Day Smoker    Packs/day: 0.50    Years: 53.00    Types: Cigarettes  . Smokeless tobacco: Never Used  . Alcohol use No    Review of Systems Constitutional: Chills, fatigue Eyes: No visual changes. ENT: No sore throat. Cardiovascular: Denies chest pain. Respiratory: Denies shortness of breath. Gastrointestinal:  No diarrhea. Genitourinary: Negative for dysuria. Musculoskeletal: Negative for back pain. Skin: Negative for rash. Neurological: Negative for headaches, focal weakness or numbness.  10-point ROS otherwise negative.  ____________________________________________   PHYSICAL EXAM:  VITAL SIGNS: ED Triage Vitals  Enc Vitals Group     BP 04/23/16 1954 (!) 175/58     Pulse Rate 04/23/16 1954 (!) 104     Resp 04/23/16 1954 (!) 25     Temp 04/23/16 1954 98.1 F (36.7 C)  Temp Source 04/23/16 1954 Oral     SpO2 04/23/16 1954 99 %     Weight 04/23/16 1951 216 lb (98 kg)     Height 04/23/16 1951 5\' 6"  (1.676 m)     Head Circumference --      Peak Flow --      Pain Score 04/23/16 1951 9     Pain Loc --      Pain Edu? --      Excl. in [place]? --     Constitutional: Alert and oriented. Appears in pain, appears acutely uncomfortable noting moderate to severe mid abdominal pain. Patient appears to have rigors  Eyes: Conjunctivae are normal. PERRL. EOMI. Head: Atraumatic. Nose: No congestion/rhinnorhea. Mouth/Throat: Mucous membranes are dry.  Oropharynx non-erythematous. Neck: No stridor.   Cardiovascular: Slight tachycardic rate, regular rhythm. Grossly normal heart sounds.  Good peripheral circulation. Respiratory: Normal respiratory effort.  No retractions. Lungs CTAB. Gastrointestinal: Soft and mildly distended, somewhat protuberant, slightly tympanitic over the mid abdomen, and very tender to palpation especially over the mid to right abdomen with mild rebound tenderness. Musculoskeletal: No lower extremity tenderness nor  edema.  No joint effusions. Right knee clean dry intact arthroscopy sites. No erythema or swelling induration or warmth. Normal and palpable posterior tibial pulses with good cap refill lower extremities bilateral. Neurologic:  Normal speech and language. No gross focal neurologic deficits are appreciated. Skin:  Skin is warm, dry and intact. No rash noted. Psychiatric: Mood and affect are slightly anxious, appropriately. Speech and behavior are normal.  ____________________________________________   LABS (all labs ordered are listed, but only abnormal results are displayed)  Labs Reviewed  COMPREHENSIVE METABOLIC PANEL - Abnormal; Notable for the following:       Result Value   Potassium 3.4 (*)    Chloride 100 (*)    Glucose, Bld 117 (*)    All other components within normal limits  CBC WITH DIFFERENTIAL/PLATELET - Abnormal; Notable for the following:    Neutro Abs 8.0 (*)    All other components within normal limits  CULTURE, BLOOD (ROUTINE X 2)  CULTURE, BLOOD (ROUTINE X 2)  URINE CULTURE  LACTIC ACID, PLASMA  TROPONIN I  PROTIME-INR  URINALYSIS COMPLETEWITH MICROSCOPIC ([facility] ONLY)   ____________________________________________  EKG  Reviewed and interpreted by me in 1955 Heart rate 100 QRS 80 QTc 490 Sinus rhythm, no evidence of acute ischemic change, mild prolongation of the QT interval is noted ____________________________________________  RADIOLOGY  Ct Abdomen Pelvis W Contrast  Result Date: 04/23/2016 CLINICAL DATA:  75 year old female with vomiting after eating abdominal pain. Constipation. EXAM: CT ABDOMEN AND PELVIS WITH CONTRAST TECHNIQUE: Multidetector CT imaging of the abdomen and pelvis was performed using the standard protocol following bolus administration of intravenous contrast. CONTRAST:  161mL ISOVUE-300 IOPAMIDOL (ISOVUE-300) INJECTION 61% COMPARISON:  CT dated 08/21/2014 FINDINGS: The visualized lung bases are clear. No intra-abdominal free air or  free fluid. The liver, gallbladder, pancreas, spleen, adrenal glands, kidneys, visualized ureters, and urinary bladder appear unremarkable. Hysterectomy. There is a focal area of wall thickening and stricture involving the proximal descending colon. There is mild inflammatory changes of the wall of the colon at this site, possibly chronic. There is narrowing of the lumen with a degree of obstruction. Moderate amount of dense stool noted throughout the colon proximal to this point. Air and stool noted in the rectosigmoid and segment of the colon distal to the stricture. There is no evidence of small-bowel obstruction or active inflammation. Normal appendix.  There is advanced aortoiliac atherosclerotic disease. There is atherosclerotic calcification and narrowing of the origins of the celiac axis, SMA, and IMA these vessels however remain patent. No portal venous gas identified. There is no adenopathy. There is a small fat containing umbilical hernia. The abdominal wall soft tissues are otherwise unremarkable. There is osteopenia with multilevel degenerative changes of the spine. There is T12, L1, and L5 compression deformity similar to prior study. There is disc desiccation with vacuum phenomena and T11-T12, and L4-L5. No acute fracture identified. IMPRESSION: Focal wall thickening and stricture of the proximal descending colon with moderate amount of hard stool in the colon proximal to the stricture without evidence of complete obstruction. Findings may be related to chronic inflammation. Underlying mass is less likely. No other acute intra-abdominal pelvic pathology identified. Electronically Signed   By: Anner Crete M.D.   On: 04/23/2016 22:51   Dg Chest Port 1 View  Result Date: 04/23/2016 CLINICAL DATA:  Fever. Altered mental status. History of right knee surgery 5 days ago. EXAM: PORTABLE CHEST 1 VIEW COMPARISON:  PA and lateral chest 08/02/2014.  CT chest 05/05/2012. FINDINGS: The lungs are  emphysematous but clear. Heart size is upper normal. No pneumothorax or pleural effusion. No focal bony abnormality. IMPRESSION: Emphysema without acute disease. Electronically Signed   By: Inge Rise M.D.   On: 04/23/2016 20:36    ____________________________________________   PROCEDURES  Procedure(s) performed: None  Procedures  Critical Care performed: No  ____________________________________________   INITIAL IMPRESSION / ASSESSMENT AND PLAN / ED COURSE  Pertinent labs & imaging results that were available during my care of the patient were reviewed by me and considered in my medical decision making (see chart for details).  Patient presents with nausea, vomiting, abdominal pain after surgery. No fever, but given symptomatology infection is concern however also I question if use of hydrocodone may be causing ileus or severe constipating type process.  We'll proceed with CT imaging, workup for both infectious and abdominal etiologies. No cardiac or pulmonary symptoms. Overall hemodynamically stable.  ----------------------------------------- 12:34 AM on 04/24/2016 -----------------------------------------  Patient continues to have moderate abdominal pain, relieved somewhat but briefly by morphine, also CT scan concerning for proximal thickening of the colon with stool burden, does not appear to show evidence of an overt complete obstruction, however the patient is fairly intractable nausea, ongoing abdominal pain, and potential exists for a possible mass though felt to be less likely. Based on ongoing discomfort, in anticipation for need for bowel rest we will admit the patient to hospital for ongoing care and clinically a picture that appears consistent with a bowel ileus.  Clinical Course     ____________________________________________   FINAL CLINICAL IMPRESSION(S) / ED DIAGNOSES  Final diagnoses:  Ileus ([place])      NEW MEDICATIONS STARTED DURING THIS  VISIT:  New Prescriptions   No medications on file     Note:  This document was prepared using Dragon voice recognition software and may include unintentional dictation errors.     Delman Kitten, MD 04/24/16 [phone]

## 2016-04-23 NOTE — ED Triage Notes (Addendum)
Pt arrived via EMS from [place] for possible code sepsis. Pt had RIGHT knee surgery on Thursday, site has no redness, warmth around it. Pt reports that she started having N/V this morning with associated abd pain. Pt has RLQ tenderness. EMS got temperature of 99.1, and reported pt ST at 104. Pt intermittently confused.

## 2016-04-24 DIAGNOSIS — F17210 Nicotine dependence, cigarettes, uncomplicated: Secondary | ICD-10-CM | POA: Diagnosis present

## 2016-04-24 DIAGNOSIS — K567 Ileus, unspecified: Principal | ICD-10-CM | POA: Diagnosis present

## 2016-04-24 DIAGNOSIS — E039 Hypothyroidism, unspecified: Secondary | ICD-10-CM | POA: Diagnosis not present

## 2016-04-24 DIAGNOSIS — R112 Nausea with vomiting, unspecified: Secondary | ICD-10-CM | POA: Diagnosis not present

## 2016-04-24 DIAGNOSIS — Y92009 Unspecified place in unspecified non-institutional (private) residence as the place of occurrence of the external cause: Secondary | ICD-10-CM | POA: Diagnosis not present

## 2016-04-24 DIAGNOSIS — R1084 Generalized abdominal pain: Secondary | ICD-10-CM

## 2016-04-24 DIAGNOSIS — F329 Major depressive disorder, single episode, unspecified: Secondary | ICD-10-CM | POA: Diagnosis present

## 2016-04-24 DIAGNOSIS — T402X5A Adverse effect of other opioids, initial encounter: Secondary | ICD-10-CM | POA: Diagnosis present

## 2016-04-24 DIAGNOSIS — Z8249 Family history of ischemic heart disease and other diseases of the circulatory system: Secondary | ICD-10-CM | POA: Diagnosis not present

## 2016-04-24 DIAGNOSIS — Y929 Unspecified place or not applicable: Secondary | ICD-10-CM | POA: Diagnosis not present

## 2016-04-24 DIAGNOSIS — R109 Unspecified abdominal pain: Secondary | ICD-10-CM | POA: Diagnosis not present

## 2016-04-24 DIAGNOSIS — E7800 Pure hypercholesterolemia, unspecified: Secondary | ICD-10-CM | POA: Diagnosis present

## 2016-04-24 DIAGNOSIS — Z9981 Dependence on supplemental oxygen: Secondary | ICD-10-CM | POA: Diagnosis not present

## 2016-04-24 DIAGNOSIS — Z833 Family history of diabetes mellitus: Secondary | ICD-10-CM | POA: Diagnosis not present

## 2016-04-24 DIAGNOSIS — Z79899 Other long term (current) drug therapy: Secondary | ICD-10-CM | POA: Diagnosis not present

## 2016-04-24 DIAGNOSIS — K598 Other specified functional intestinal disorders: Secondary | ICD-10-CM | POA: Diagnosis present

## 2016-04-24 DIAGNOSIS — Z801 Family history of malignant neoplasm of trachea, bronchus and lung: Secondary | ICD-10-CM | POA: Diagnosis not present

## 2016-04-24 DIAGNOSIS — I10 Essential (primary) hypertension: Secondary | ICD-10-CM | POA: Diagnosis not present

## 2016-04-24 DIAGNOSIS — Z96652 Presence of left artificial knee joint: Secondary | ICD-10-CM | POA: Diagnosis present

## 2016-04-24 DIAGNOSIS — K219 Gastro-esophageal reflux disease without esophagitis: Secondary | ICD-10-CM | POA: Diagnosis present

## 2016-04-24 DIAGNOSIS — E876 Hypokalemia: Secondary | ICD-10-CM | POA: Diagnosis not present

## 2016-04-24 DIAGNOSIS — J449 Chronic obstructive pulmonary disease, unspecified: Secondary | ICD-10-CM | POA: Diagnosis present

## 2016-04-24 LAB — CBC
HCT: 34.9 % — ABNORMAL LOW (ref 35.0–47.0)
Hemoglobin: 11.9 g/dL — ABNORMAL LOW (ref 12.0–16.0)
MCH: 29 pg (ref 26.0–34.0)
MCHC: 34.1 g/dL (ref 32.0–36.0)
MCV: 84.9 fL (ref 80.0–100.0)
Platelets: 212 10*3/uL (ref 150–440)
RBC: 4.11 MIL/uL (ref 3.80–5.20)
RDW: 14.5 % (ref 11.5–14.5)
WBC: 9.7 10*3/uL (ref 3.6–11.0)

## 2016-04-24 LAB — MAGNESIUM: Magnesium: 1.8 mg/dL (ref 1.7–2.4)

## 2016-04-24 LAB — BASIC METABOLIC PANEL
Anion gap: 9 (ref 5–15)
BUN: 16 mg/dL (ref 6–20)
CO2: 26 mmol/L (ref 22–32)
Calcium: 8.3 mg/dL — ABNORMAL LOW (ref 8.9–10.3)
Chloride: 103 mmol/L (ref 101–111)
Creatinine, Ser: 0.59 mg/dL (ref 0.44–1.00)
GFR calc Af Amer: >60 mL/min (ref >60)
GFR calc non Af Amer: >60 mL/min (ref >60)
Glucose, Bld: 99 mg/dL (ref 65–99)
Potassium: 3.3 mmol/L — ABNORMAL LOW (ref 3.5–5.1)
Sodium: 138 mmol/L (ref 135–145)

## 2016-04-24 MED ORDER — HYDROMORPHONE HCL 1 MG/ML IJ SOLN
0.5000 mg | Freq: Once | INTRAMUSCULAR | Status: AC
  Administered 2016-04-24: 0.5 mg via INTRAVENOUS

## 2016-04-24 MED ORDER — VENLAFAXINE HCL ER 75 MG PO CP24
225.0000 mg | ORAL_CAPSULE | Freq: Every day | ORAL | Status: DC
  Administered 2016-04-25: 225 mg via ORAL
  Filled 2016-04-24: qty 3

## 2016-04-24 MED ORDER — VENLAFAXINE HCL 37.5 MG PO TABS
75.0000 mg | ORAL_TABLET | Freq: Three times a day (TID) | ORAL | Status: DC
  Administered 2016-04-24 (×2): 75 mg via ORAL
  Filled 2016-04-24 (×2): qty 2

## 2016-04-24 MED ORDER — POTASSIUM CHLORIDE IN NACL 20-0.45 MEQ/L-% IV SOLN
INTRAVENOUS | Status: DC
Start: 2016-04-24 — End: 2016-04-25
  Administered 2016-04-24: 11:00:00 via INTRAVENOUS
  Filled 2016-04-24 (×2): qty 1000

## 2016-04-24 MED ORDER — GABAPENTIN 100 MG PO CAPS
100.0000 mg | ORAL_CAPSULE | Freq: Three times a day (TID) | ORAL | Status: DC
  Administered 2016-04-24 – 2016-04-25 (×4): 100 mg via ORAL
  Filled 2016-04-24 (×4): qty 1

## 2016-04-24 MED ORDER — ONDANSETRON HCL 4 MG/2ML IJ SOLN
4.0000 mg | Freq: Once | INTRAMUSCULAR | Status: AC
  Administered 2016-04-24: 4 mg via INTRAVENOUS

## 2016-04-24 MED ORDER — SODIUM CHLORIDE 0.9 % IV SOLN: INTRAVENOUS | Status: DC

## 2016-04-24 MED ORDER — ACETAMINOPHEN 325 MG PO TABS
650.0000 mg | ORAL_TABLET | Freq: Four times a day (QID) | ORAL | Status: DC | PRN
  Administered 2016-04-24 – 2016-04-25 (×2): 650 mg via ORAL
  Filled 2016-04-24 (×2): qty 2

## 2016-04-24 MED ORDER — ATORVASTATIN CALCIUM 10 MG PO TABS
10.0000 mg | ORAL_TABLET | Freq: Every evening | ORAL | Status: DC
  Administered 2016-04-24: 10 mg via ORAL
  Filled 2016-04-24: qty 1

## 2016-04-24 MED ORDER — ALBUTEROL SULFATE (2.5 MG/3ML) 0.083% IN NEBU: 3.0000 mL | INHALATION_SOLUTION | Freq: Four times a day (QID) | RESPIRATORY_TRACT | Status: DC | PRN

## 2016-04-24 MED ORDER — ONDANSETRON HCL 4 MG PO TABS: 4.0000 mg | ORAL_TABLET | Freq: Four times a day (QID) | ORAL | Status: DC | PRN

## 2016-04-24 MED ORDER — MORPHINE SULFATE (PF) 2 MG/ML IV SOLN: 2.0000 mg | Freq: Four times a day (QID) | INTRAVENOUS | Status: DC | PRN

## 2016-04-24 MED ORDER — ENOXAPARIN SODIUM 40 MG/0.4ML ~~LOC~~ SOLN
40.0000 mg | SUBCUTANEOUS | Status: DC
Start: 2016-04-24 — End: 2016-04-25
  Administered 2016-04-24: 40 mg via SUBCUTANEOUS
  Filled 2016-04-24: qty 0.4

## 2016-04-24 MED ORDER — TIOTROPIUM BROMIDE MONOHYDRATE 18 MCG IN CAPS
18.0000 ug | ORAL_CAPSULE | Freq: Every day | RESPIRATORY_TRACT | Status: DC
  Administered 2016-04-24 – 2016-04-25 (×2): 18 ug via RESPIRATORY_TRACT
  Filled 2016-04-24: qty 5

## 2016-04-24 MED ORDER — SODIUM CHLORIDE 0.9 % IV SOLN
INTRAVENOUS | Status: DC
  Administered 2016-04-24: 03:00:00 via INTRAVENOUS

## 2016-04-24 MED ORDER — KETOROLAC TROMETHAMINE 15 MG/ML IJ SOLN
15.0000 mg | Freq: Four times a day (QID) | INTRAMUSCULAR | Status: DC | PRN
  Administered 2016-04-24: 15 mg via INTRAVENOUS
  Filled 2016-04-24: qty 1

## 2016-04-24 MED ORDER — METOPROLOL TARTRATE 5 MG/5ML IV SOLN
5.0000 mg | Freq: Two times a day (BID) | INTRAVENOUS | Status: DC
  Administered 2016-04-24 – 2016-04-25 (×3): 5 mg via INTRAVENOUS
  Filled 2016-04-24 (×4): qty 5

## 2016-04-24 MED ORDER — METOPROLOL TARTRATE 50 MG PO TABS: 50.0000 mg | ORAL_TABLET | Freq: Every evening | ORAL | Status: DC

## 2016-04-24 MED ORDER — HYDROMORPHONE HCL 1 MG/ML IJ SOLN
INTRAMUSCULAR | Status: AC
  Administered 2016-04-24: 0.5 mg via INTRAVENOUS
  Filled 2016-04-24: qty 1

## 2016-04-24 MED ORDER — VENLAFAXINE HCL ER 75 MG PO CP24
150.0000 mg | ORAL_CAPSULE | Freq: Every day | ORAL | Status: DC
  Administered 2016-04-24: 150 mg via ORAL
  Filled 2016-04-24: qty 2

## 2016-04-24 MED ORDER — ACETAMINOPHEN 650 MG RE SUPP: 650.0000 mg | Freq: Four times a day (QID) | RECTAL | Status: DC | PRN

## 2016-04-24 MED ORDER — ONDANSETRON HCL 4 MG/2ML IJ SOLN
INTRAMUSCULAR | Status: AC
  Administered 2016-04-24: 4 mg via INTRAVENOUS
  Filled 2016-04-24: qty 2

## 2016-04-24 MED ORDER — ALBUTEROL SULFATE (2.5 MG/3ML) 0.083% IN NEBU: 2.5000 mg | INHALATION_SOLUTION | RESPIRATORY_TRACT | Status: DC | PRN

## 2016-04-24 MED ORDER — POTASSIUM CHLORIDE 10 MEQ/100ML IV SOLN
10.0000 meq | INTRAVENOUS | Status: AC
  Administered 2016-04-24 (×2): 10 meq via INTRAVENOUS
  Filled 2016-04-24 (×2): qty 100

## 2016-04-24 MED ORDER — ONDANSETRON HCL 4 MG/2ML IJ SOLN
4.0000 mg | Freq: Four times a day (QID) | INTRAMUSCULAR | Status: DC | PRN
  Administered 2016-04-24: 4 mg via INTRAVENOUS
  Filled 2016-04-24: qty 2

## 2016-04-24 MED ORDER — LEVOTHYROXINE SODIUM 88 MCG PO TABS
88.0000 ug | ORAL_TABLET | Freq: Every day | ORAL | Status: DC
  Administered 2016-04-24 – 2016-04-25 (×2): 88 ug via ORAL
  Filled 2016-04-24 (×2): qty 1

## 2016-04-24 NOTE — Progress Notes (Signed)
Clinical Social Worker (CSW) received consult that patient was admitted from a facility. Per RN Case Manager patient is from home. Please reconsult if future social work needs arise. CSW signing off.   [employer], LCSW [phone]

## 2016-04-24 NOTE — ED Notes (Signed)
Patient states that her abdominal discomfort has not improved since she received pain medication. RN, Judson Roch, was informed. Patient also indicated that she felt hot. Removed her blanket and replaced with a sheet.

## 2016-04-24 NOTE — Progress Notes (Signed)
Pt stating that abdominal pain has eased off. PT states that she is able to pass a little bit of flatus.

## 2016-04-24 NOTE — ED Notes (Signed)
Report given to floor RN. Pt taken to floor via stretcher. Vital signs stable prior to transport.  

## 2016-04-24 NOTE — H&P (Signed)
[place] @ [facility] Admission History and Physical Harvie Bridge, D.O.  ---------------------------------------------------------------------------------------------------------------------   PATIENT NAME: [name] Douglas MR#: [id number] DATE OF BIRTH: 1940/11/16 DATE OF ADMISSION: 04/23/2016 PRIMARY CARE PHYSICIAN: Lavera Guise, MD  REQUESTING/REFERRING PHYSICIAN: ED Dr. Jacqualine Code   4 days ago CHIEF COMPLAINT: Chief Complaint  Patient presents with  . Abdominal Pain  . Fever  . Nausea  . Emesis    HISTORY OF PRESENT ILLNESS: [name] Douglas is a 75 y.o. female with a known history of Recent right knee arthroscopy, hypertension, hyperlipidemia, COPD home O2 dependent 4 days ago was in a usual state of health until 24 hours ago when she developed nausea vomiting and upper abdominal pain. Her daughter states that she had a right knee arthroscopy under general anesthesia 4 days ago. She was prescribed oxycodone which she took for 24 hours but was not helping with her pain. Her physician then increased and changed her medication to hydrocodone which he has been taking every 4 hours for the past 3 days. She states that she developed nausea, vomiting, abdominal pain, bloating and distention in the past 24 hours. She has passed very small amounts of stool. She is not passing gas. She also reports feeling chills but denies any pain, swelling or discomfort around the right knee.  Otherwise there has been no change in status. Patient has been taking medication as prescribed and there has been no recent change in medication or diet.  There has been no recent illness, travel or sick contacts.    Patient denies fevers/chills, weakness, dizziness, chest pain, shortness of breath, , dysuria/frequency, changes in mental status.    PAST MEDICAL HISTORY: Past Medical History:  Diagnosis Date  . Anxiety   . Arthritis    "shoulders" (08/20/2014)  . Asthma   . Chronic lower back pain   .  Complication of anesthesia    "agitated & restless after knee replacement in 2008"  . COPD (chronic obstructive pulmonary disease) ([place])   . DDD (degenerative disc disease), lumbar   . Depression   . Diverticulosis   . GERD (gastroesophageal reflux disease)   . High cholesterol   . Hypertension   . Hypothyroidism   . On home oxygen therapy    "2L at night" (08/20/2014)  . Shortness of breath dyspnea    with exertion      PAST SURGICAL HISTORY: Past Surgical History:  Procedure Laterality Date  . CARDIAC CATHETERIZATION  ~ 2007  . EXCISIONAL HEMORRHOIDECTOMY  1960's  . EYE SURGERY Bilateral    Cataract extraction with IOL  . JOINT REPLACEMENT    . KNEE ARTHROSCOPY Left X 2 <2008  . KNEE ARTHROSCOPY WITH SUBCHONDROPLASTY Right 04/19/2016   Procedure: KNEE ARTHROSCOPY WITH SUBCHONDROPLASTY, PARTIAL MENISCECTOMY;  Surgeon: Hessie Knows, MD;  Location: [facility] ORS;  Service: Orthopedics;  Laterality: Right;  . SHOULDER OPEN ROTATOR CUFF REPAIR Right 2001  . TONSILLECTOMY  ~ 1950  . TOTAL KNEE ARTHROPLASTY Left 2008  . TUBAL LIGATION  1973  . VAGINAL HYSTERECTOMY  1974      SOCIAL HISTORY: Social History  Substance Use Topics  . Smoking status: Current Every Day Smoker    Packs/day: 0.50    Years: 53.00    Types: Cigarettes  . Smokeless tobacco: Never Used  . Alcohol use No      FAMILY HISTORY: Family History  Problem Relation Age of Onset  . Diabetes Mother   . Hypertension Mother   . Lung cancer Mother   .  Congestive Heart Failure Father   . Congestive Heart Failure Sister      MEDICATIONS AT HOME: Prior to Admission medications   Medication Sig Start Date End Date Taking? Authorizing Provider  albuterol (PROVENTIL HFA;VENTOLIN HFA) 108 (90 BASE) MCG/ACT inhaler Inhale 2 puffs into the lungs every 6 (six) hours as needed for wheezing or shortness of breath. 08/28/14  Yes Ripudeep K Rai, MD  albuterol (PROVENTIL) (2.5 MG/3ML) 0.083% nebulizer solution Take 3 mLs (2.5  mg total) by nebulization 2 (two) times daily. Patient taking differently: Take 2.5 mg by nebulization as needed.  08/28/14  Yes Ripudeep Krystal Eaton, MD  atorvastatin (LIPITOR) 10 MG tablet Take 10 mg by mouth every evening.   Yes Historical Provider, MD  Calcium Carbonate-Vitamin D (CALCIUM-VITAMIN D) 500-200 MG-UNIT tablet Take 1 tablet by mouth daily.   Yes Historical Provider, MD  gabapentin (NEURONTIN) 100 MG capsule Take 100 mg by mouth 3 (three) times daily.   Yes Historical Provider, MD  HYDROcodone-acetaminophen (NORCO) 10-325 MG tablet Take 1 tablet by mouth every 4 (four) hours as needed.   Yes Historical Provider, MD  Lactobacillus (ACIDOPHILUS PO) Take by mouth.   Yes Historical Provider, MD  levothyroxine (SYNTHROID, LEVOTHROID) 88 MCG tablet Take 88 mcg by mouth daily before breakfast.   Yes Historical Provider, MD  metoprolol (LOPRESSOR) 50 MG tablet Take 50 mg by mouth every evening.    Yes Historical Provider, MD  tiotropium (SPIRIVA HANDIHALER) 18 MCG inhalation capsule Place 1 capsule (18 mcg total) into inhaler and inhale daily. 08/28/14  Yes Ripudeep Krystal Eaton, MD  traMADol (ULTRAM) 50 MG tablet Take 50 mg by mouth 3 (three) times daily as needed.   Yes Historical Provider, MD  venlafaxine (EFFEXOR) 75 MG tablet Take 75 mg by mouth 3 (three) times daily with meals.   Yes Historical Provider, MD  venlafaxine XR (EFFEXOR-XR) 150 MG 24 hr capsule Take 150 mg by mouth daily with breakfast.    Yes Historical Provider, MD      DRUG ALLERGIES: No Known Allergies   REVIEW OF SYSTEMS: CONSTITUTIONAL: No fever, fatigue, weakness, weight gain/loss, headache. Positive chills EYES: No blurry or double vision. ENT: No tinnitus, postnasal drip, redness or soreness of the oropharynx. RESPIRATORY: No cough, wheeze, hemoptysis, dyspnea. CARDIOVASCULAR: No chest pain, orthopnea, palpitations, syncope. GASTROINTESTINAL: Positive nausea, vomiting, constipation, abdominal pain. No, hematemesis, melena  or hematochezia. GENITOURINARY: No dysuria or hematuria. ENDOCRINE: No polyuria or nocturia. No heat or cold intolerance. HEMATOLOGY: No anemia, bruising, bleeding. INTEGUMENTARY: No rashes, ulcers, lesions. MUSCULOSKELETAL: No arthritis, swelling, gout. NEUROLOGIC: No numbness, tingling, weakness or ataxia. No seizure-type activity. PSYCHIATRIC: No anxiety, depression, insomnia.  PHYSICAL EXAMINATION: VITAL SIGNS: Blood pressure (!) 146/67, pulse 95, temperature 98.7 F (37.1 C), temperature source Oral, resp. rate 13, height 5\' 6"  (1.676 m), weight 98 kg (216 lb), SpO2 99 %.  GENERAL: 75 y.o.-year-old white female patient, well-developed, well-nourished lying in the bed in no acute distress.  Pleasant and cooperative.   HEENT: Head atraumatic, normocephalic. Pupils equal, round, reactive to light and accommodation. No scleral icterus. Extraocular muscles intact. Nares are patent. Oropharynx is clear. Mucus membranes moist. NECK: Supple, full range of motion. No JVD, no bruit heard. No thyroid enlargement, no tenderness, no cervical lymphadenopathy. CHEST: Normal breath sounds bilaterally. No wheezing, rales, rhonchi or crackles. No use of accessory muscles of respiration.  No reproducible chest wall tenderness.  CARDIOVASCULAR: S1, S2 normal. No murmurs, rubs, or gallops. Cap refill <2 seconds. ABDOMEN: Soft, mildly  distended, tenderness to palpation over the epigastrium. Positive bowel sounds 4 quadrants.. No rebound, guarding, rigidity.  EXTREMITIES: Full range of motion. No pedal edema, cyanosis, or clubbing. NEUROLOGIC: Cranial nerves II through XII are grossly intact with no focal sensorimotor deficit. Muscle strength 5/5 in all extremities. Sensation intact. Gait not checked. PSYCHIATRIC: The patient is alert and oriented x 3. Normal affect, mood, thought content. SKIN: Warm, dry, and intact without obvious rash, lesion, or ulcer.  LABORATORY PANEL:  CBC  Recent Labs Lab  04/23/16 1956  WBC 10.8  HGB 13.1  HCT 38.1  PLT 240   ----------------------------------------------------------------------------------------------------------------- Chemistries  Recent Labs Lab 04/23/16 1956  NA 137  K 3.4*  CL 100*  CO2 24  GLUCOSE 117*  BUN 20  CREATININE 0.75  CALCIUM 9.2  AST 22  ALT 18  ALKPHOS 89  BILITOT 1.1   ------------------------------------------------------------------------------------------------------------------ Cardiac Enzymes  Recent Labs Lab 04/23/16 2001  TROPONINI <0.03   ------------------------------------------------------------------------------------------------------------------  RADIOLOGY: Ct Abdomen Pelvis W Contrast  Result Date: 04/23/2016 CLINICAL DATA:  75 year old female with vomiting after eating abdominal pain. Constipation. EXAM: CT ABDOMEN AND PELVIS WITH CONTRAST TECHNIQUE: Multidetector CT imaging of the abdomen and pelvis was performed using the standard protocol following bolus administration of intravenous contrast. CONTRAST:  167mL ISOVUE-300 IOPAMIDOL (ISOVUE-300) INJECTION 61% COMPARISON:  CT dated 08/21/2014 FINDINGS: The visualized lung bases are clear. No intra-abdominal free air or free fluid. The liver, gallbladder, pancreas, spleen, adrenal glands, kidneys, visualized ureters, and urinary bladder appear unremarkable. Hysterectomy. There is a focal area of wall thickening and stricture involving the proximal descending colon. There is mild inflammatory changes of the wall of the colon at this site, possibly chronic. There is narrowing of the lumen with a degree of obstruction. Moderate amount of dense stool noted throughout the colon proximal to this point. Air and stool noted in the rectosigmoid and segment of the colon distal to the stricture. There is no evidence of small-bowel obstruction or active inflammation. Normal appendix. There is advanced aortoiliac atherosclerotic disease. There is  atherosclerotic calcification and narrowing of the origins of the celiac axis, SMA, and IMA these vessels however remain patent. No portal venous gas identified. There is no adenopathy. There is a small fat containing umbilical hernia. The abdominal wall soft tissues are otherwise unremarkable. There is osteopenia with multilevel degenerative changes of the spine. There is T12, L1, and L5 compression deformity similar to prior study. There is disc desiccation with vacuum phenomena and T11-T12, and L4-L5. No acute fracture identified. IMPRESSION: Focal wall thickening and stricture of the proximal descending colon with moderate amount of hard stool in the colon proximal to the stricture without evidence of complete obstruction. Findings may be related to chronic inflammation. Underlying mass is less likely. No other acute intra-abdominal pelvic pathology identified. Electronically Signed   By: Anner Crete M.D.   On: 04/23/2016 22:51   Dg Chest Port 1 View  Result Date: 04/23/2016 CLINICAL DATA:  Fever. Altered mental status. History of right knee surgery 5 days ago. EXAM: PORTABLE CHEST 1 VIEW COMPARISON:  PA and lateral chest 08/02/2014.  CT chest 05/05/2012. FINDINGS: The lungs are emphysematous but clear. Heart size is upper normal. No pneumothorax or pleural effusion. No focal bony abnormality. IMPRESSION: Emphysema without acute disease. Electronically Signed   By: Inge Rise M.D.   On: 04/23/2016 20:36    EKG: Sinus rhythm at 100 bpm with QT prolongation, nonspecific ST and T wave changes.  IMPRESSION AND PLAN:  This  is a 75 y.o. female with a history of hypertension, hyperlipidemia, home O2 dependent COPD, recent right knee arthroscopy now being admitted with: 1. Ileus, possible small bowel obstruction. Patient's CAT scan indicates a focal area of wall thickening and stricture involving the proximal descending colon with mild inflammatory changes, and narrowing of the lumen and a degree  of obstruction with moderate dense stool around the colon proximal to this point without evidence of small bowel obstruction or active inflammation. There is also focal wall thickening which may be related to chronic inflammation. Underlying mass is less likely however possibility. Given the patient's intractable vomiting, abdominal pain and decreased stool output/lack of flatus we will admit for treatment of ileus. Bowel rest, pain control, antiemetics, IV fluid hydration, surgical consultation for comanagement and consideration of NG tube placement for decompression of the bowel. 2. Hypokalemia. We'll replace intravenously. Otherwise continue home medications   Diet/Nutrition: Nothing by mouth Fluids: IV normal saline DVT Px: Lovenox, SCDs and early ambulation Code Status: Full  All the records are reviewed and case discussed with ED provider. Management plans discussed with the patient and/or family who express understanding and agree with plan of care.   TOTAL TIME TAKING CARE OF THIS PATIENT: 60 minutes.   Madix Blowe D.O. on 04/24/2016 at 1:26 AM Between 7am to 6pm - Pager - [phone] After 6pm go to [email or web address] - [employer] [facility] Office [phone] CC: Primary care physician; Lavera Guise, MD     Note: This dictation was prepared with Dragon dictation along with smaller phrase technology. Any transcriptional errors that result from this process are unintentional.

## 2016-04-24 NOTE — Consult Note (Signed)
Patient ID: [name] Douglas, female   DOB: 29-Feb-1941, 75 y.o.   MRN: [id number]  CC: ABDOMINAL PAIN, Nausea and vomiting.   HPI [name] Douglas is a 75 y.o. female who presented to the hospital with abdominal pain, nausea, vomiting and is currently admitted to the medicine service. Surgery consult requested by Dr. Manuella Ghazi. Patient reports that she had an elective knee surgery last week that was uncomplicated. She reports that 2 days ago while at home she had a sudden onset of abdominal pain followed by nausea and vomiting. Nausea and vomiting did not light up with time and therefore she came to the emergency room. She has continued to have some nausea and vomiting since admission. She denies any pains currently. However as an outpatient she been taking around the clock oral narcotics. She states his abdominal pain has somewhat resided but she continues to have nausea and vomiting. Currently denies any fevers, chills, chest pain, shortness of breath, diarrhea. She does think she is constipated and that she has not had a real bowel movement since before her knee surgery.  HPI  Past Medical History:  Diagnosis Date  . Anxiety   . Arthritis    "shoulders" (08/20/2014)  . Asthma   . Chronic lower back pain   . Complication of anesthesia    "agitated & restless after knee replacement in 2008"  . COPD (chronic obstructive pulmonary disease) ([place])   . DDD (degenerative disc disease), lumbar   . Depression   . Diverticulosis   . GERD (gastroesophageal reflux disease)   . High cholesterol   . Hypertension   . Hypothyroidism   . On home oxygen therapy    "2L at night" (08/20/2014)  . Shortness of breath dyspnea    with exertion    Past Surgical History:  Procedure Laterality Date  . CARDIAC CATHETERIZATION  ~ 2007  . EXCISIONAL HEMORRHOIDECTOMY  1960's  . EYE SURGERY Bilateral    Cataract extraction with IOL  . JOINT REPLACEMENT    . KNEE ARTHROSCOPY Left X 2 <2008  . KNEE ARTHROSCOPY WITH  SUBCHONDROPLASTY Right 04/19/2016   Procedure: KNEE ARTHROSCOPY WITH SUBCHONDROPLASTY, PARTIAL MENISCECTOMY;  Surgeon: Hessie Knows, MD;  Location: [facility] ORS;  Service: Orthopedics;  Laterality: Right;  . SHOULDER OPEN ROTATOR CUFF REPAIR Right 2001  . TONSILLECTOMY  ~ 1950  . TOTAL KNEE ARTHROPLASTY Left 2008  . TUBAL LIGATION  1973  . VAGINAL HYSTERECTOMY  1974    Family History  Problem Relation Age of Onset  . Diabetes Mother   . Hypertension Mother   . Lung cancer Mother   . Congestive Heart Failure Father   . Congestive Heart Failure Sister     Social History Social History  Substance Use Topics  . Smoking status: Current Every Day Smoker    Packs/day: 0.50    Years: 53.00    Types: Cigarettes  . Smokeless tobacco: Never Used  . Alcohol use No    No Known Allergies  Current Facility-Administered Medications  Medication Dose Route Frequency Provider Last Rate Last Dose  . 0.45 % NaCl with KCl 20 mEq / L infusion   Intravenous Continuous Max Sane, MD 50 mL/hr at 04/24/16 1122    . acetaminophen (TYLENOL) tablet 650 mg  650 mg Oral Q6H PRN Alexis Hugelmeyer, DO   650 mg at 04/24/16 0910   Or  . acetaminophen (TYLENOL) suppository 650 mg  650 mg Rectal Q6H PRN Alexis Hugelmeyer, DO      .  albuterol (PROVENTIL) (2.5 MG/3ML) 0.083% nebulizer solution 2.5 mg  2.5 mg Nebulization PRN Alexis Hugelmeyer, DO      . albuterol (PROVENTIL) (2.5 MG/3ML) 0.083% nebulizer solution 3 mL  3 mL Inhalation Q6H PRN Alexis Hugelmeyer, DO      . atorvastatin (LIPITOR) tablet 10 mg  10 mg Oral QPM Alexis Hugelmeyer, DO      . enoxaparin (LOVENOX) injection 40 mg  40 mg Subcutaneous Q24H Alexis Hugelmeyer, DO      . gabapentin (NEURONTIN) capsule 100 mg  100 mg Oral TID Alexis Hugelmeyer, DO   100 mg at 04/24/16 0910  . ketorolac (TORADOL) 15 MG/ML injection 15 mg  15 mg Intravenous Q6H PRN Vipul Shah, MD      . levothyroxine (SYNTHROID, LEVOTHROID) tablet 88 mcg  88 mcg Oral QAC breakfast  Alexis Hugelmeyer, DO   88 mcg at 04/24/16 0820  . metoprolol (LOPRESSOR) injection 5 mg  5 mg Intravenous Q12H Max Sane, MD   5 mg at 04/24/16 1122  . morphine 2 MG/ML injection 2 mg  2 mg Intravenous Q6H PRN Max Sane, MD      . ondansetron (ZOFRAN) tablet 4 mg  4 mg Oral Q6H PRN Alexis Hugelmeyer, DO       Or  . ondansetron (ZOFRAN) injection 4 mg  4 mg Intravenous Q6H PRN Alexis Hugelmeyer, DO   4 mg at 04/24/16 0915  . tiotropium [facility]) inhalation capsule 18 mcg  18 mcg Inhalation Daily Alexis Hugelmeyer, DO   18 mcg at 04/24/16 0911  . venlafaxine [facility]) tablet 75 mg  75 mg Oral TID WC Alexis Hugelmeyer, DO   75 mg at 04/24/16 1122  . venlafaxine XR (EFFEXOR-XR) 24 hr capsule 150 mg  150 mg Oral Q breakfast Alexis Hugelmeyer, DO   150 mg at 04/24/16 [id number]     Review of Systems A Multi-point review of systems was asked and was negative except for the findings documented in the history of present illness  Physical Exam Blood pressure (!) 128/44, pulse 80, temperature 98 F (36.7 C), temperature source Oral, resp. rate 19, height 5\' 6"  (1.676 m), weight 98 kg (216 lb), SpO2 96 %. CONSTITUTIONAL: No acute distress. EYES: Pupils are equal, round, and reactive to light, Sclera are non-icteric. EARS, NOSE, MOUTH AND THROAT: The oropharynx is clear. The oral mucosa is pink and moist. Hearing is intact to voice. LYMPH NODES:  Lymph nodes in the neck are normal. RESPIRATORY:  Lungs are clear. There is normal respiratory effort, with equal breath sounds bilaterally, and without pathologic use of accessory muscles. CARDIOVASCULAR: Heart is regular without murmurs, gallops, or rubs. GI: The abdomen is soft, nontender, and nondistended. There are no palpable masses. There is no hepatosplenomegaly. There are normal bowel sounds in all quadrants. GU: Rectal deferred.   MUSCULOSKELETAL: Normal muscle strength and tone. No cyanosis or edema.   SKIN: Turgor is good and there are no pathologic  skin lesions or ulcers. NEUROLOGIC: Motor and sensation is grossly normal. Cranial nerves are grossly intact. PSYCH:  Oriented to person, place and time. Affect is normal.  Data Reviewed Images and labs reviewed. Labs show a mild hypokalemia at 3.3 and a mild anemia 11.9 over 34.9. Remainder of her labs are within normal limits. Upon review of her CT scan her small bowel is of normal caliber throughout without any evidence of small bowel obstruction. Her colon does have a large volume of stool in her ascending and transverse colon as well as proximal  descending colon. Distal to this there is a minimal amount of stool without a clear source of obstruction visualized on the CT scan. I have personally reviewed the patient's imaging, laboratory findings and medical records.    Assessment    Postoperative ileus versus colonic pseudoobstruction    Plan    75 year old female admitted to the medicine service with abdominal pain, nausea, vomiting. Given the timeframe of being less than 1 week out from an elective knee surgery this is most likely a narcotic-induced dysmotility such as ileus or colonic pseudoobstruction. There is no current evidence of mechanical bowel obstruction. Recommend continued bowel rest, IV hydration, aggressive electrolyte replacement. Should she continue to have nausea and vomiting would recommend an NG tube for gastric decompression for relief of symptoms. Would encourage ambulation is much as tolerated. No current indication for any emergent surgical intervention. Surgery will continue to follow with you until her bowel function returns.     Time spent with the patient was 80 minutes, with more than 50% of the time spent in face-to-face education, counseling and care coordination.     Clayburn Pert, MD FACS General Surgeon 04/24/2016, 12:04 PM

## 2016-04-24 NOTE — Progress Notes (Signed)
[place] [facility] [place] NAME: [name] Douglas    MR#:  [id number]  DATE OF BIRTH:  August 04, 1941  SUBJECTIVE:  CHIEF COMPLAINT:   Chief Complaint  Patient presents with  . Abdominal Pain  . Fever  . Nausea  . Emesis  daughter at bedside, pain under control REVIEW OF SYSTEMS:  Review of Systems  Constitutional: Negative for chills, fever and weight loss.  HENT: Negative for nosebleeds and sore throat.   Eyes: Negative for blurred vision.  Respiratory: Negative for cough, shortness of breath and wheezing.   Cardiovascular: Negative for chest pain, orthopnea, leg swelling and PND.  Gastrointestinal: Positive for abdominal pain. Negative for constipation, diarrhea, heartburn, nausea and vomiting.  Genitourinary: Negative for dysuria and urgency.  Musculoskeletal: Negative for back pain.  Skin: Negative for rash.  Neurological: Negative for dizziness, speech change, focal weakness and headaches.  Endo/Heme/Allergies: Does not bruise/bleed easily.  Psychiatric/Behavioral: Negative for depression.   DRUG ALLERGIES:  No Known Allergies VITALS:  Blood pressure (!) 150/46, pulse 93, temperature 98 F (36.7 C), temperature source Oral, resp. rate 18, height 5\' 6"  (1.676 m), weight 98 kg (216 lb), SpO2 96 %. PHYSICAL EXAMINATION:  Physical Exam  Constitutional: She is oriented to person, place, and time and well-developed, well-nourished, and in no distress.  HENT:  Head: Normocephalic and atraumatic.  Eyes: Conjunctivae and EOM are normal. Pupils are equal, round, and reactive to light.  Neck: Normal range of motion. Neck supple. No tracheal deviation present. No thyromegaly present.  Cardiovascular: Normal rate, regular rhythm and normal heart sounds.   Pulmonary/Chest: Effort normal and breath sounds normal. No respiratory distress. She has no wheezes. She exhibits no tenderness.  Abdominal: Soft. She exhibits distension. Bowel sounds are  hypoactive. There is no tenderness.  Musculoskeletal: Normal range of motion.  Neurological: She is alert and oriented to person, place, and time. No cranial nerve deficit.  Skin: Skin is warm and dry. No rash noted.  Psychiatric: Mood and affect normal.   LABORATORY PANEL:   CBC  Recent Labs Lab 04/24/16 0425  WBC 9.7  HGB 11.9*  HCT 34.9*  PLT 212   ------------------------------------------------------------------------------------------------------------------ Chemistries   Recent Labs Lab 04/23/16 1956 04/24/16 0425  NA 137 138  K 3.4* 3.3*  CL 100* 103  CO2 24 26  GLUCOSE 117* 99  BUN 20 16  CREATININE 0.75 0.59  CALCIUM 9.2 8.3*  AST 22  --   ALT 18  --   ALKPHOS 89  --   BILITOT 1.1  --    RADIOLOGY:  Ct Abdomen Pelvis W Contrast  Result Date: 04/23/2016 CLINICAL DATA:  75 year old female with vomiting after eating abdominal pain. Constipation. EXAM: CT ABDOMEN AND PELVIS WITH CONTRAST TECHNIQUE: Multidetector CT imaging of the abdomen and pelvis was performed using the standard protocol following bolus administration of intravenous contrast. CONTRAST:  113mL ISOVUE-300 IOPAMIDOL (ISOVUE-300) INJECTION 61% COMPARISON:  CT dated 08/21/2014 FINDINGS: The visualized lung bases are clear. No intra-abdominal free air or free fluid. The liver, gallbladder, pancreas, spleen, adrenal glands, kidneys, visualized ureters, and urinary bladder appear unremarkable. Hysterectomy. There is a focal area of wall thickening and stricture involving the proximal descending colon. There is mild inflammatory changes of the wall of the colon at this site, possibly chronic. There is narrowing of the lumen with a degree of obstruction. Moderate amount of dense stool noted throughout the colon proximal to this point. Air and stool noted in the  rectosigmoid and segment of the colon distal to the stricture. There is no evidence of small-bowel obstruction or active inflammation. Normal appendix.  There is advanced aortoiliac atherosclerotic disease. There is atherosclerotic calcification and narrowing of the origins of the celiac axis, SMA, and IMA these vessels however remain patent. No portal venous gas identified. There is no adenopathy. There is a small fat containing umbilical hernia. The abdominal wall soft tissues are otherwise unremarkable. There is osteopenia with multilevel degenerative changes of the spine. There is T12, L1, and L5 compression deformity similar to prior study. There is disc desiccation with vacuum phenomena and T11-T12, and L4-L5. No acute fracture identified. IMPRESSION: Focal wall thickening and stricture of the proximal descending colon with moderate amount of hard stool in the colon proximal to the stricture without evidence of complete obstruction. Findings may be related to chronic inflammation. Underlying mass is less likely. No other acute intra-abdominal pelvic pathology identified. Electronically Signed   By: Anner Crete M.D.   On: 04/23/2016 22:51   Dg Chest Port 1 View  Result Date: 04/23/2016 CLINICAL DATA:  Fever. Altered mental status. History of right knee surgery 5 days ago. EXAM: PORTABLE CHEST 1 VIEW COMPARISON:  PA and lateral chest 08/02/2014.  CT chest 05/05/2012. FINDINGS: The lungs are emphysematous but clear. Heart size is upper normal. No pneumothorax or pleural effusion. No focal bony abnormality. IMPRESSION: Emphysema without acute disease. Electronically Signed   By: Inge Rise M.D.   On: 04/23/2016 20:36   ASSESSMENT AND PLAN:  This is a 75 y.o. female with a history of hypertension, hyperlipidemia, home O2 dependent COPD, recent right knee arthroscopy now being admitted with:  1. Ileus, possible small bowel obstruction. - Continue Bowel rest, pain control, antiemetics, IV fluid hydration. - Await surgical consultation - avoid narcotics if and when possible - add toradol for better pain control   2. Hypokalemia: replete and  recheck - check mg  3. Hypothyroidism - continue synthroid  4. HTN - change metoprolol to IV lopressor for better BP/HR control   Discontinue telemetry  All the records are reviewed and case discussed with Care Management/Social Worker. Management plans discussed with the patient, family (daughter at bedside), RN and they are in agreement.  CODE STATUS: FULL CODE  TOTAL TIME TAKING CARE OF THIS PATIENT: 35 minutes.   More than 50% of the time was spent in counseling/coordination of care: YES  POSSIBLE D/C IN 2-3 DAYS, DEPENDING ON CLINICAL CONDITION.   [facility], Elva Breaker M.D on 04/24/2016 at 10:49 AM  Between 7am to 6pm - Pager - [phone]  After 6pm go to [email or web address] - Proofreader  [facility]  Office  [phone]  CC: Primary care physician; Lavera Guise, MD  Note: This dictation was prepared with Dragon dictation along with smaller phrase technology. Any transcriptional errors that result from this process are unintentional.

## 2016-04-24 NOTE — Care Management (Signed)
It is documented that patient is from [place]- which is not correct.  Patient is from her own home and lives independently. Currently is not receiving any services in the home. Patient had knee arthroplasty 8/31.  She developed vomiting and abdominal pain and admitted with ileus/  possible small bowel obstruction.  She is currently NPO and surgery consult is pending.   Current with her pcp and no issues accessing medical care, obtaining medications or transportation

## 2016-04-25 ENCOUNTER — Inpatient Hospital Stay: Payer: Medicare Other

## 2016-04-25 LAB — BASIC METABOLIC PANEL
Anion gap: 7 (ref 5–15)
BUN: 16 mg/dL (ref 6–20)
CO2: 26 mmol/L (ref 22–32)
Calcium: 8.2 mg/dL — ABNORMAL LOW (ref 8.9–10.3)
Chloride: 103 mmol/L (ref 101–111)
Creatinine, Ser: 0.7 mg/dL (ref 0.44–1.00)
GFR calc Af Amer: >60 mL/min (ref >60)
GFR calc non Af Amer: >60 mL/min (ref >60)
Glucose, Bld: 81 mg/dL (ref 65–99)
Potassium: 3.7 mmol/L (ref 3.5–5.1)
Sodium: 136 mmol/L (ref 135–145)

## 2016-04-25 LAB — CBC
HCT: 31.8 % — ABNORMAL LOW (ref 35.0–47.0)
Hemoglobin: 11 g/dL — ABNORMAL LOW (ref 12.0–16.0)
MCH: 29.1 pg (ref 26.0–34.0)
MCHC: 34.6 g/dL (ref 32.0–36.0)
MCV: 84.3 fL (ref 80.0–100.0)
Platelets: 193 10*3/uL (ref 150–440)
RBC: 3.78 MIL/uL — ABNORMAL LOW (ref 3.80–5.20)
RDW: 14.7 % — ABNORMAL HIGH (ref 11.5–14.5)
WBC: 6.8 10*3/uL (ref 3.6–11.0)

## 2016-04-25 MED ORDER — VENLAFAXINE HCL ER 75 MG PO CP24: 225.0000 mg | ORAL_CAPSULE | Freq: Every day | ORAL | 0 refills | Status: DC

## 2016-04-25 NOTE — Care Management Important Message (Signed)
Important Message  Patient Details  Name: [name] Douglas MRN: [id number] Date of Birth: 04-May-1941   Medicare Important Message Given:  N/A - LOS <3 / Initial given by admissions    Alvie Heidelberg, RN 04/25/2016, 11:19 AM

## 2016-04-25 NOTE — Plan of Care (Signed)
Problem: Bowel/Gastric: Goal: Will not experience complications related to bowel motility Outcome: Completed/Met Date Met: 04/25/16 Pt is discharged at this time.

## 2016-04-25 NOTE — Progress Notes (Signed)
Shift assessment completed at 0800, pt had already left the unit for x ray and returned. Daughter at bedside. Pt is alert and oriented, c/o headache and received tylenol po for this. Pt is on room air, lungs clear bilat, s1s2 heard, abdomen is soft, bs heard. Pt denied nausea, denied abdominal pain. Pt denied difficulty voiding, ppp, slight edema to R knee, bandage evident to kneecap. PIV #20 intact to l wrist with iv `1/2ns with 20 meq kcl infusing at 24mls/hr, site is free of redness and swelling. Since assessment completed, pain was reassesed at 0/10, and MD has rounded on pt, pt to be dc'd if she tolerates a meal. IVF has been dc'd. Pt is oob to chair, call bell in reach.

## 2016-04-25 NOTE — Discharge Instructions (Signed)
Ileus ° Ileus is a condition in which the intestines, also called the bowels, stop working and moving correctly. If the intestines stop working, food cannot pass through to get digested. The intestines are hollow organs that digest food after the food leaves the stomach. These organs are long, muscular tubes that connect the stomach to the rectum. When ileus occurs, the muscular contractions that cause food to move through the intestines stop happening as they normally would. °Ileus can occur for various reasons. This condition is a serious problem that usually requires hospitalization. It can cause symptoms such as nausea, abdominal pain, and bloating. Ileus can last from a few hours to a few days. If the intestines stop working because of a blockage, that is a different condition that is called a bowel obstruction. °CAUSES °This condition may be caused by: °· Surgery on the abdomen. °· An infection or inflammation in the abdomen. This includes inflammation of the lining of the abdomen (peritonitis). °· Infection or inflammation in other parts of the body, such as pneumonia or pancreatitis. °· Passage of gallstones or kidney stones. °· Damage to the nerves or blood vessels that go to the intestines. °· A collection of blood within the abdominal cavity. °· Imbalance in the salts in the blood (electrolytes). °· Injury to the brain or spinal cord. °· Medicines. Many medicines, including strong pain medicines, can cause ileus or make it worse. °SYMPTOMS °Symptoms of this condition include: °· Bloating of the abdomen. °· Pain or discomfort in the abdomen. °· Poor appetite. °· Nausea and vomiting. °· Lack of normal bowel sounds, such as "growling" in the stomach. °DIAGNOSIS °This condition may be diagnosed with: °· A physical exam and medical history. °· X-rays or a CT scan of the abdomen. °You may also have other tests to help find the cause of the condition. °TREATMENT °Treatment for this condition may  include: °· Resting the intestines until they start to work again. This is often done by: °¨ Stopping oral intake of food and drink. You will be given fluid through an IV tube to prevent dehydration. °¨ Placing a small tube (nasogastric tube or NG tube) that is passed through your nose and into your stomach. The tube is attached to a suction device and keeps the stomach emptied out. This allows the bowels to rest and also helps to reduce nausea and vomiting. °· Correcting any electrolyte imbalance by giving supplements in the IV fluid. °· Stopping any medicines that might make ileus worse. °· Treating any condition that may have caused ileus. °HOME CARE INSTRUCTIONS °· Follow instructions from your health care provider about diet and fluid intake. Usually, you will be told to: °¨ Drink plenty of clear fluids. °¨ Avoid alcohol. °¨ Avoid caffeine. °¨ Eat a bland diet. °· Get plenty of rest. Return to your normal activities as told by your health care provider. °· Take over-the-counter and prescription medicines only as told by your health care provider. °· Keep all follow-up visits as told by your health care provider. This is important. °SEEK MEDICAL CARE IF: °· You have nausea, vomiting, or abdominal discomfort. °· You have a fever. °SEEK IMMEDIATE MEDICAL CARE IF: °· You have severe abdominal pain or bloating. °· You cannot eat or drink without vomiting. °  °This information is not intended to replace advice given to you by your health care provider. Make sure you discuss any questions you have with your health care provider. °  °Document Released: 08/09/2003 Document Revised: 04/27/2015 Document   Reviewed: 09/30/2014 °Elsevier Interactive Patient Education ©2016 [employer] Inc[employer] ° °

## 2016-04-25 NOTE — Care Management Note (Signed)
Case Management Note  Patient Details  Name: [name] Douglas MRN: [id number] Date of Birth: April 21, 1941  Subjective/Objective:  Spoke with patient for continued discharge planning. Patient is alert oriented and independent from home. Lives in a seniors apartment complex. Still drives self and has the assistance of her daughter as well. Has a walker at home but rarely uses.                  Action/Plan: Home with self care.  Expected Discharge Date:  04/26/16               Expected Discharge Plan:  Home/Self Care  In-House Referral:     Discharge planning Services     Post Acute Care Choice:    Choice offered to:     DME Arranged:    DME Agency:     HH Arranged:    HH Agency:     Status of Service:  Completed, signed off  If discussed at [employer] of Stay Meetings, dates discussed:    Additional Comments:  Alvie Heidelberg, RN 04/25/2016, 11:31 AM

## 2016-04-25 NOTE — Progress Notes (Signed)
This writer removed PIV from pt's l wrist intact, pt tolerated well. This Probation officer reviewed d/c instructions, including follow up appointments and med changes, and a script that was sent to pharmacy electronically, with pt and her daughter. Both verbally indicated they understood, pt signed and received the instructions. Pt left the unit via wheelchair to front entrance and waiting family vehicle. Of note, pt has bm prior to leaving after eating breakfast, denied nausea, denied pain, stated she feels good.

## 2016-04-25 NOTE — Progress Notes (Signed)
  Patient seen briefly this morning. All of her abdominal complaints have resolved. She has had bowel movement.  Discussed with the patient's attending and the patient is being discharged home. Likely source of abdominal discomfort was narcotic-induced dysmotility.  Please call again if surgery can be of any further assistance in this patient's care.  Clayburn Pert, MD [facility]

## 2016-04-26 NOTE — Discharge Summary (Signed)
[place] at [place] NAME: [name] Douglas    MR#:  [id number]  DATE OF BIRTH:  12-08-1940  DATE OF ADMISSION:  04/23/2016   ADMITTING PHYSICIAN: Harvie Bridge, DO  DATE OF DISCHARGE: 04/25/2016 12:15 PM  PRIMARY CARE PHYSICIAN: Lavera Guise, MD   ADMISSION DIAGNOSIS:  Ileus ([place]) [K56.7] DISCHARGE DIAGNOSIS:  Active Problems:   Ileus ([place])   Generalized abdominal pain  SECONDARY DIAGNOSIS:   Past Medical History:  Diagnosis Date  . Anxiety   . Arthritis    "shoulders" (08/20/2014)  . Asthma   . Chronic lower back pain   . Complication of anesthesia    "agitated & restless after knee replacement in 2008"  . COPD (chronic obstructive pulmonary disease) ([place])   . DDD (degenerative disc disease), lumbar   . Depression   . Diverticulosis   . GERD (gastroesophageal reflux disease)   . High cholesterol   . Hypertension   . Hypothyroidism   . On home oxygen therapy    "2L at night" (08/20/2014)  . Shortness of breath dyspnea    with exertion   HOSPITAL COURSE:  This is a 75 y.o.femalewith a history of hypertension, hyperlipidemia, home O2 dependent COPD, recent right knee arthroscopyadmitted with:  1. Ileus, possible small bowel obstruction. - Likely due to narcotics, now resolved - Recommend avoiding narcotics if and when possible  2. Hypokalemia: Repleted and resolved  DISCHARGE CONDITIONS:  stable CONSULTS OBTAINED:   DRUG ALLERGIES:  No Known Allergies DISCHARGE MEDICATIONS:     Medication List    STOP taking these medications   ACIDOPHILUS PO   HYDROcodone-acetaminophen 10-325 MG tablet Commonly known as:  NORCO     TAKE these medications   albuterol 108 (90 Base) MCG/ACT inhaler Commonly known as:  PROVENTIL HFA;VENTOLIN HFA Inhale 2 puffs into the lungs every 6 (six) hours as needed for wheezing or shortness of breath. What changed:  Another medication with the same name was changed. Make sure you  understand how and when to take each.   albuterol (2.5 MG/3ML) 0.083% nebulizer solution Commonly known as:  PROVENTIL Take 3 mLs (2.5 mg total) by nebulization 2 (two) times daily. What changed:  when to take this  reasons to take this   atorvastatin 10 MG tablet Commonly known as:  LIPITOR Take 10 mg by mouth every evening.   calcium-vitamin D 500-200 MG-UNIT tablet Take 1 tablet by mouth daily.   gabapentin 100 MG capsule Commonly known as:  NEURONTIN Take 100 mg by mouth 3 (three) times daily.   levothyroxine 88 MCG tablet Commonly known as:  SYNTHROID, LEVOTHROID Take 88 mcg by mouth daily before breakfast.   metoprolol 50 MG tablet Commonly known as:  LOPRESSOR Take 50 mg by mouth every evening.   tiotropium 18 MCG inhalation capsule Commonly known as:  SPIRIVA HANDIHALER Place 1 capsule (18 mcg total) into inhaler and inhale daily.   traMADol 50 MG tablet Commonly known as:  ULTRAM Take 50 mg by mouth 3 (three) times daily as needed.   venlafaxine XR 75 MG 24 hr capsule Commonly known as:  EFFEXOR-XR Take 3 capsules (225 mg total) by mouth daily with breakfast. What changed:  medication strength  how much to take  Another medication with the same name was removed. Continue taking this medication, and follow the directions you see here.      DISCHARGE INSTRUCTIONS:   DIET:  Regular diet DISCHARGE CONDITION:  Good ACTIVITY:  Activity as tolerated OXYGEN:  Home Oxygen: No.  Oxygen Delivery: room air DISCHARGE LOCATION:  home   If you experience worsening of your admission symptoms, develop shortness of breath, life threatening emergency, suicidal or homicidal thoughts you must seek medical attention immediately by calling 911 or calling your MD immediately  if symptoms less severe.  You Must read complete instructions/literature along with all the possible adverse reactions/side effects for all the Medicines you take and that have been prescribed  to you. Take any new Medicines after you have completely understood and accpet all the possible adverse reactions/side effects.   Please note  You were cared for by a hospitalist during your hospital stay. If you have any questions about your discharge medications or the care you received while you were in the hospital after you are discharged, you can call the unit and asked to speak with the hospitalist on call if the hospitalist that took care of you is not available. Once you are discharged, your primary care physician will handle any further medical issues. Please note that NO REFILLS for any discharge medications will be authorized once you are discharged, as it is imperative that you return to your primary care physician (or establish a relationship with a primary care physician if you do not have one) for your aftercare needs so that they can reassess your need for medications and monitor your lab values.    On the day of Discharge:  VITAL SIGNS:  Blood pressure 115/87, pulse (!) 36, temperature 97.5 F (36.4 C), temperature source Oral, resp. rate 19, height 5\' 6"  (1.676 m), weight 98 kg (216 lb), SpO2 100 %. PHYSICAL EXAMINATION:  GENERAL:  75 y.o.-year-old patient lying in the bed with no acute distress.  EYES: Pupils equal, round, reactive to light and accommodation. No scleral icterus. Extraocular muscles intact.  HEENT: Head atraumatic, normocephalic. Oropharynx and nasopharynx clear.  NECK:  Supple, no jugular venous distention. No thyroid enlargement, no tenderness.  LUNGS: Normal breath sounds bilaterally, no wheezing, rales,rhonchi or crepitation. No use of accessory muscles of respiration.  CARDIOVASCULAR: S1, S2 normal. No murmurs, rubs, or gallops.  ABDOMEN: Soft, non-tender, non-distended. Bowel sounds present. No organomegaly or mass.  EXTREMITIES: No pedal edema, cyanosis, or clubbing.  NEUROLOGIC: Cranial nerves II through XII are intact. Muscle strength 5/5 in all  extremities. Sensation intact. Gait not checked.  PSYCHIATRIC: The patient is alert and oriented x 3.  SKIN: No obvious rash, lesion, or ulcer.  DATA REVIEW:   CBC  Recent Labs Lab 04/25/16 0441  WBC 6.8  HGB 11.0*  HCT 31.8*  PLT 193    Chemistries   Recent Labs Lab 04/23/16 1956 04/24/16 0425 04/25/16 0441  NA 137 138 136  K 3.4* 3.3* 3.7  CL 100* 103 103  CO2 24 26 26   GLUCOSE 117* 99 81  BUN 20 16 16   CREATININE 0.75 0.59 0.70  CALCIUM 9.2 8.3* 8.2*  MG  --  1.8  --   AST 22  --   --   ALT 18  --   --   ALKPHOS 89  --   --   BILITOT 1.1  --   --    Follow-up Information    Lavera Guise, MD. Go on 05/10/2016.   Specialty:  Internal Medicine Why:  At 1045am Contact information: [address] [place] [address] [phone]        MENZ,MICHAEL, MD. Daphane Shepherd on 05/04/2016.   Specialty:  Orthopedic Surgery  Why:  At 115pm Contact information: [place] [address] [phone]           Management plans discussed with the patient, family and they are in agreement.  CODE STATUS: Full code  TOTAL TIME TAKING CARE OF THIS PATIENT: 45 minutes.    [facility], Taviana Westergren M.D on 04/26/2016 at 4:02 PM  Between 7am to 6pm - Pager - [phone]  After 6pm go to [email or web address] - Proofreader  [facility] [place] [facility]  Office  [phone]  CC: Primary care physician; Lavera Guise, MD   Note: This dictation was prepared with Dragon dictation along with smaller phrase technology. Any transcriptional errors that result from this process are unintentional.

## 2016-04-28 LAB — CULTURE, BLOOD (ROUTINE X 2)
Culture: NO GROWTH
Culture: NO GROWTH

## 2016-05-18 DIAGNOSIS — M1711 Unilateral primary osteoarthritis, right knee: Secondary | ICD-10-CM | POA: Diagnosis not present

## 2016-05-28 DIAGNOSIS — M25561 Pain in right knee: Secondary | ICD-10-CM | POA: Diagnosis not present

## 2016-05-29 ENCOUNTER — Other Ambulatory Visit: Payer: Self-pay | Admitting: Orthopedic Surgery

## 2016-05-29 DIAGNOSIS — M1711 Unilateral primary osteoarthritis, right knee: Secondary | ICD-10-CM

## 2016-06-06 ENCOUNTER — Ambulatory Visit
Admission: RE | Admit: 2016-06-06 | Discharge: 2016-06-06 | Disposition: A | Discharge status: Home / Self Care | Payer: Medicare Other | Source: Ambulatory Visit | Attending: Orthopedic Surgery | Admitting: Orthopedic Surgery

## 2016-06-06 DIAGNOSIS — M7121 Synovial cyst of popliteal space [Baker], right knee: Secondary | ICD-10-CM | POA: Insufficient documentation

## 2016-06-06 DIAGNOSIS — M1711 Unilateral primary osteoarthritis, right knee: Secondary | ICD-10-CM | POA: Diagnosis not present

## 2016-06-13 DIAGNOSIS — M1711 Unilateral primary osteoarthritis, right knee: Secondary | ICD-10-CM | POA: Diagnosis not present

## 2016-06-20 ENCOUNTER — Inpatient Hospital Stay: Admission: RE | Admit: 2016-06-20 | Payer: Medicare Other | Source: Ambulatory Visit

## 2016-06-27 ENCOUNTER — Encounter
Admission: RE | Admit: 2016-06-27 | Discharge: 2016-06-27 | Disposition: A | Discharge status: Home / Self Care | Payer: Medicare Other | Source: Ambulatory Visit | Attending: Orthopedic Surgery | Admitting: Orthopedic Surgery

## 2016-06-27 DIAGNOSIS — Z888 Allergy status to other drugs, medicaments and biological substances status: Secondary | ICD-10-CM | POA: Insufficient documentation

## 2016-06-27 DIAGNOSIS — Z01812 Encounter for preprocedural laboratory examination: Secondary | ICD-10-CM | POA: Diagnosis not present

## 2016-06-27 DIAGNOSIS — M1711 Unilateral primary osteoarthritis, right knee: Secondary | ICD-10-CM | POA: Insufficient documentation

## 2016-06-27 DIAGNOSIS — Z0183 Encounter for blood typing: Secondary | ICD-10-CM | POA: Diagnosis not present

## 2016-06-27 LAB — URINALYSIS COMPLETE WITH MICROSCOPIC (ARMC ONLY)
Bacteria, UA: NONE SEEN
Bilirubin Urine: NEGATIVE
Glucose, UA: NEGATIVE mg/dL
Ketones, ur: NEGATIVE mg/dL
Leukocytes, UA: NEGATIVE
Nitrite: NEGATIVE
Protein, ur: NEGATIVE mg/dL
Specific Gravity, Urine: 1.013 (ref 1.005–1.030)
pH: 5 (ref 5.0–8.0)

## 2016-06-27 LAB — CBC
HCT: 38 % (ref 35.0–47.0)
Hemoglobin: 12.6 g/dL (ref 12.0–16.0)
MCH: 28.7 pg (ref 26.0–34.0)
MCHC: 33.2 g/dL (ref 32.0–36.0)
MCV: 86.2 fL (ref 80.0–100.0)
Platelets: 280 10*3/uL (ref 150–440)
RBC: 4.41 MIL/uL (ref 3.80–5.20)
RDW: 15.3 % — ABNORMAL HIGH (ref 11.5–14.5)
WBC: 8.8 10*3/uL (ref 3.6–11.0)

## 2016-06-27 LAB — BASIC METABOLIC PANEL
Anion gap: 7 (ref 5–15)
BUN: 15 mg/dL (ref 6–20)
CO2: 28 mmol/L (ref 22–32)
Calcium: 9.5 mg/dL (ref 8.9–10.3)
Chloride: 105 mmol/L (ref 101–111)
Creatinine, Ser: 0.71 mg/dL (ref 0.44–1.00)
GFR calc Af Amer: >60 mL/min (ref >60)
GFR calc non Af Amer: >60 mL/min (ref >60)
Glucose, Bld: 111 mg/dL — ABNORMAL HIGH (ref 65–99)
Potassium: 3.7 mmol/L (ref 3.5–5.1)
Sodium: 140 mmol/L (ref 135–145)

## 2016-06-27 LAB — TYPE AND SCREEN
ABO/RH(D): A POS
Antibody Screen: NEGATIVE

## 2016-06-27 LAB — PROTIME-INR
INR: 0.93
Prothrombin Time: 12.5 seconds (ref 11.4–15.2)

## 2016-06-27 LAB — SURGICAL PCR SCREEN
MRSA, PCR: NEGATIVE
Staphylococcus aureus: POSITIVE — AB

## 2016-06-27 LAB — APTT: aPTT: 38 seconds — ABNORMAL HIGH (ref 24–36)

## 2016-06-27 LAB — SEDIMENTATION RATE: Sed Rate: 34 mm/hr — ABNORMAL HIGH (ref 0–30)

## 2016-06-27 NOTE — Patient Instructions (Signed)
  Your procedure is scheduled on: 07/03/16 Tues Report to Same Day Surgery 2nd floor medical mall To find out your arrival time please call [phone] between 1PM - 3PM on 07/02/16 Mon  Remember: Instructions that are not followed completely may result in serious medical risk, up to and including death, or upon the discretion of your surgeon and anesthesiologist your surgery may need to be rescheduled.    _x___ 1. Do not eat food or drink liquids after midnight. No gum chewing or hard candies.     __x__ 2. No Alcohol for 24 hours before or after surgery.   __x__3. No Smoking for 24 prior to surgery.   ____  4. Bring all medications with you on the day of surgery if instructed.    __x__ 5. Notify your doctor if there is any change in your medical condition     (cold, fever, infections).     Do not wear jewelry, make-up, hairpins, clips or nail polish.  Do not wear lotions, powders, or perfumes. You may wear deodorant.  Do not shave 48 hours prior to surgery. Men may shave face and neck.  Do not bring valuables to the hospital.    [facility] is not responsible for any belongings or valuables.               Contacts, dentures or bridgework may not be worn into surgery.  Leave your suitcase in the car. After surgery it may be brought to your room.  For patients admitted to the hospital, discharge time is determined by your treatment team.   Patients discharged the day of surgery will not be allowed to drive home.    Please read over the following fact sheets that you were given:   [facility] Preparing for Surgery and or MRSA Information   _x___ Take these medicines the morning of surgery with A SIP OF WATER:    1. albuterol (PROVENTIL HFA;VENTOLIN HFA) 108 (90 BASE and bring with you to surgery  2.gabapentin (NEURONTIN) 100 MG capsule  3.levothyroxine (SYNTHROID, LEVOTHROID) 88 MCG tablet  4.tiotropium (SPIRIVA HANDIHALER) 18 MCG inhalation capsule  5.traMADol (ULTRAM) 50 MG  tablet  6.venlafaxine XR (EFFEXOR-XR) 75 MG 24 hr capsule  ____Fleets enema or Magnesium Citrate as directed.   _x___ Use CHG Soap or sage wipes as directed on instruction sheet   ____ Use inhalers on the day of surgery and bring to hospital day of surgery  ____ Stop metformin 2 days prior to surgery    ____ Take 1/2 of usual insulin dose the night before surgery and none on the morning of           surgery.   ____ Stop aspirin or coumadin, or plavix  x__ Stop Anti-inflammatories such as Advil, Aleve, Ibuprofen, Motrin, Naproxen,          Naprosyn, Goodies powders or aspirin products. Ok to take Tylenol.   ____ Stop supplements until after surgery.    ____ Bring C-Pap to the hospital.

## 2016-06-28 LAB — URINE CULTURE: Culture: NO GROWTH

## 2016-06-28 NOTE — Pre-Procedure Instructions (Signed)
11:31 Faxed notification that staph was + and MRSA -.  Ancef 2 gm ordered preop. Any new Orders?

## 2016-07-03 ENCOUNTER — Inpatient Hospital Stay: Payer: Medicare Other

## 2016-07-03 ENCOUNTER — Encounter
Admission: RE | Disposition: A | Discharge status: Skilled Nursing Facility | Payer: Self-pay | Source: Ambulatory Visit | Attending: Orthopedic Surgery

## 2016-07-03 ENCOUNTER — Inpatient Hospital Stay: Payer: Medicare Other | Admitting: Anesthesiology

## 2016-07-03 ENCOUNTER — Inpatient Hospital Stay
Admission: RE | Admit: 2016-07-03 | Discharge: 2016-07-06 | DRG: 470 | Disposition: A | Discharge status: Skilled Nursing Facility | Payer: Medicare Other | Source: Ambulatory Visit | Attending: Orthopedic Surgery | Admitting: Orthopedic Surgery

## 2016-07-03 ENCOUNTER — Encounter: Payer: Self-pay | Admitting: *Deleted

## 2016-07-03 DIAGNOSIS — M19019 Primary osteoarthritis, unspecified shoulder: Secondary | ICD-10-CM | POA: Diagnosis present

## 2016-07-03 DIAGNOSIS — Z9981 Dependence on supplemental oxygen: Secondary | ICD-10-CM

## 2016-07-03 DIAGNOSIS — G8929 Other chronic pain: Secondary | ICD-10-CM | POA: Diagnosis present

## 2016-07-03 DIAGNOSIS — K219 Gastro-esophageal reflux disease without esophagitis: Secondary | ICD-10-CM | POA: Diagnosis present

## 2016-07-03 DIAGNOSIS — M5136 Other intervertebral disc degeneration, lumbar region: Secondary | ICD-10-CM | POA: Diagnosis present

## 2016-07-03 DIAGNOSIS — Z79899 Other long term (current) drug therapy: Secondary | ICD-10-CM | POA: Diagnosis not present

## 2016-07-03 DIAGNOSIS — E7800 Pure hypercholesterolemia, unspecified: Secondary | ICD-10-CM | POA: Diagnosis present

## 2016-07-03 DIAGNOSIS — F419 Anxiety disorder, unspecified: Secondary | ICD-10-CM | POA: Diagnosis present

## 2016-07-03 DIAGNOSIS — Z23 Encounter for immunization: Secondary | ICD-10-CM | POA: Diagnosis not present

## 2016-07-03 DIAGNOSIS — E039 Hypothyroidism, unspecified: Secondary | ICD-10-CM | POA: Diagnosis present

## 2016-07-03 DIAGNOSIS — Z886 Allergy status to analgesic agent status: Secondary | ICD-10-CM

## 2016-07-03 DIAGNOSIS — M545 Low back pain: Secondary | ICD-10-CM | POA: Diagnosis present

## 2016-07-03 DIAGNOSIS — Z6834 Body mass index (BMI) 34.0-34.9, adult: Secondary | ICD-10-CM

## 2016-07-03 DIAGNOSIS — M19011 Primary osteoarthritis, right shoulder: Secondary | ICD-10-CM | POA: Diagnosis not present

## 2016-07-03 DIAGNOSIS — Z781 Physical restraint status: Secondary | ICD-10-CM | POA: Diagnosis not present

## 2016-07-03 DIAGNOSIS — M25561 Pain in right knee: Secondary | ICD-10-CM

## 2016-07-03 DIAGNOSIS — E785 Hyperlipidemia, unspecified: Secondary | ICD-10-CM | POA: Diagnosis not present

## 2016-07-03 DIAGNOSIS — I10 Essential (primary) hypertension: Secondary | ICD-10-CM | POA: Diagnosis not present

## 2016-07-03 DIAGNOSIS — Z96651 Presence of right artificial knee joint: Secondary | ICD-10-CM | POA: Diagnosis not present

## 2016-07-03 DIAGNOSIS — M1711 Unilateral primary osteoarthritis, right knee: Principal | ICD-10-CM | POA: Diagnosis present

## 2016-07-03 DIAGNOSIS — G8918 Other acute postprocedural pain: Secondary | ICD-10-CM

## 2016-07-03 DIAGNOSIS — F418 Other specified anxiety disorders: Secondary | ICD-10-CM | POA: Diagnosis not present

## 2016-07-03 DIAGNOSIS — R262 Difficulty in walking, not elsewhere classified: Secondary | ICD-10-CM

## 2016-07-03 DIAGNOSIS — M6281 Muscle weakness (generalized): Secondary | ICD-10-CM

## 2016-07-03 DIAGNOSIS — F329 Major depressive disorder, single episode, unspecified: Secondary | ICD-10-CM | POA: Diagnosis present

## 2016-07-03 DIAGNOSIS — J449 Chronic obstructive pulmonary disease, unspecified: Secondary | ICD-10-CM | POA: Diagnosis not present

## 2016-07-03 DIAGNOSIS — M19012 Primary osteoarthritis, left shoulder: Secondary | ICD-10-CM | POA: Diagnosis not present

## 2016-07-03 DIAGNOSIS — Z471 Aftercare following joint replacement surgery: Secondary | ICD-10-CM | POA: Diagnosis not present

## 2016-07-03 HISTORY — PX: TOTAL KNEE ARTHROPLASTY: SHX125

## 2016-07-03 LAB — ABO/RH: ABO/RH(D): A POS

## 2016-07-03 LAB — CBC
HCT: 36.1 % (ref 35.0–47.0)
Hemoglobin: 11.9 g/dL — ABNORMAL LOW (ref 12.0–16.0)
MCH: 28.8 pg (ref 26.0–34.0)
MCHC: 33.1 g/dL (ref 32.0–36.0)
MCV: 87 fL (ref 80.0–100.0)
Platelets: 252 10*3/uL (ref 150–440)
RBC: 4.15 MIL/uL (ref 3.80–5.20)
RDW: 15.1 % — ABNORMAL HIGH (ref 11.5–14.5)
WBC: 8.8 10*3/uL (ref 3.6–11.0)

## 2016-07-03 LAB — CREATININE, SERUM
Creatinine, Ser: 0.72 mg/dL (ref 0.44–1.00)
GFR calc Af Amer: >60 mL/min (ref >60)
GFR calc non Af Amer: >60 mL/min (ref >60)

## 2016-07-03 SURGERY — ARTHROPLASTY, KNEE, TOTAL
Anesthesia: Spinal | Laterality: Right | Wound class: Clean

## 2016-07-03 MED ORDER — LEVOTHYROXINE SODIUM 88 MCG PO TABS
88.0000 ug | ORAL_TABLET | Freq: Every day | ORAL | Status: DC
  Administered 2016-07-05 – 2016-07-06 (×2): 88 ug via ORAL
  Filled 2016-07-03 (×2): qty 1

## 2016-07-03 MED ORDER — FENTANYL CITRATE (PF) 100 MCG/2ML IJ SOLN: 25.0000 ug | INTRAMUSCULAR | Status: DC | PRN

## 2016-07-03 MED ORDER — METOCLOPRAMIDE HCL 5 MG/ML IJ SOLN
5.0000 mg | Freq: Three times a day (TID) | INTRAMUSCULAR | Status: DC | PRN
Start: 2016-07-03 — End: 2016-07-06

## 2016-07-03 MED ORDER — DOCUSATE SODIUM 100 MG PO CAPS
100.0000 mg | ORAL_CAPSULE | Freq: Two times a day (BID) | ORAL | Status: DC
  Administered 2016-07-03 – 2016-07-06 (×7): 100 mg via ORAL
  Filled 2016-07-03 (×7): qty 1

## 2016-07-03 MED ORDER — CALCIUM CARBONATE-VITAMIN D 500-200 MG-UNIT PO TABS
1.0000 | ORAL_TABLET | Freq: Every day | ORAL | Status: DC
  Administered 2016-07-04 – 2016-07-06 (×3): 1 via ORAL
  Filled 2016-07-03 (×3): qty 1

## 2016-07-03 MED ORDER — BUPIVACAINE-EPINEPHRINE (PF) 0.25% -1:200000 IJ SOLN
INTRAMUSCULAR | Status: AC
  Filled 2016-07-03: qty 30

## 2016-07-03 MED ORDER — CEFAZOLIN SODIUM-DEXTROSE 2-4 GM/100ML-% IV SOLN
INTRAVENOUS | Status: AC
  Filled 2016-07-03: qty 100

## 2016-07-03 MED ORDER — TIOTROPIUM BROMIDE MONOHYDRATE 18 MCG IN CAPS
18.0000 ug | ORAL_CAPSULE | Freq: Every day | RESPIRATORY_TRACT | Status: DC
  Administered 2016-07-03 – 2016-07-06 (×4): 18 ug via RESPIRATORY_TRACT
  Filled 2016-07-03: qty 5

## 2016-07-03 MED ORDER — GABAPENTIN 100 MG PO CAPS
100.0000 mg | ORAL_CAPSULE | Freq: Three times a day (TID) | ORAL | Status: DC
  Administered 2016-07-03 – 2016-07-06 (×10): 100 mg via ORAL
  Filled 2016-07-03 (×10): qty 1

## 2016-07-03 MED ORDER — BUPIVACAINE LIPOSOME 1.3 % IJ SUSP
INTRAMUSCULAR | Status: AC
  Filled 2016-07-03: qty 20

## 2016-07-03 MED ORDER — DEXTROSE 5 % IV SOLN
500.0000 mg | Freq: Four times a day (QID) | INTRAVENOUS | Status: DC | PRN
  Filled 2016-07-03: qty 5

## 2016-07-03 MED ORDER — MAGNESIUM CITRATE PO SOLN
1.0000 | Freq: Once | ORAL | Status: AC | PRN
  Administered 2016-07-06: 1 via ORAL
  Filled 2016-07-03 (×2): qty 296

## 2016-07-03 MED ORDER — MENTHOL 3 MG MT LOZG
1.0000 | LOZENGE | OROMUCOSAL | Status: DC | PRN
  Filled 2016-07-03: qty 9

## 2016-07-03 MED ORDER — MORPHINE SULFATE 10 MG/ML IJ SOLN
INTRAMUSCULAR | Status: DC | PRN
  Administered 2016-07-03: 10 mg via SUBCUTANEOUS

## 2016-07-03 MED ORDER — MORPHINE SULFATE (PF) 2 MG/ML IV SOLN
2.0000 mg | INTRAVENOUS | Status: DC | PRN
  Administered 2016-07-03 – 2016-07-04 (×4): 2 mg via INTRAVENOUS
  Filled 2016-07-03 (×5): qty 1

## 2016-07-03 MED ORDER — LIDOCAINE HCL (PF) 2 % IJ SOLN
INTRAMUSCULAR | Status: DC | PRN
  Administered 2016-07-03: 50 mg

## 2016-07-03 MED ORDER — FAMOTIDINE 20 MG PO TABS
ORAL_TABLET | ORAL | Status: AC
  Filled 2016-07-03: qty 1

## 2016-07-03 MED ORDER — KETAMINE HCL 50 MG/ML IJ SOLN
INTRAMUSCULAR | Status: DC | PRN
  Administered 2016-07-03: 50 mg via INTRAVENOUS

## 2016-07-03 MED ORDER — OXYCODONE HCL 5 MG PO TABS: 5.0000 mg | ORAL_TABLET | Freq: Once | ORAL | Status: DC | PRN

## 2016-07-03 MED ORDER — METOCLOPRAMIDE HCL 10 MG PO TABS
5.0000 mg | ORAL_TABLET | Freq: Three times a day (TID) | ORAL | Status: DC | PRN
  Administered 2016-07-05: 10 mg via ORAL
  Filled 2016-07-03: qty 1

## 2016-07-03 MED ORDER — NEOMYCIN-POLYMYXIN B GU 40-200000 IR SOLN
Status: DC | PRN
  Administered 2016-07-03: 16 mL

## 2016-07-03 MED ORDER — LACTATED RINGERS IV SOLN
INTRAVENOUS | Status: DC
  Administered 2016-07-03: 09:00:00 via INTRAVENOUS
  Administered 2016-07-03: 50 mL/h via INTRAVENOUS

## 2016-07-03 MED ORDER — MORPHINE SULFATE (PF) 10 MG/ML IV SOLN
INTRAVENOUS | Status: AC
  Filled 2016-07-03: qty 1

## 2016-07-03 MED ORDER — INFLUENZA VAC SPLIT QUAD 0.5 ML IM SUSY
0.5000 mL | PREFILLED_SYRINGE | INTRAMUSCULAR | Status: AC
  Administered 2016-07-04: 0.5 mL via INTRAMUSCULAR
  Filled 2016-07-03: qty 0.5

## 2016-07-03 MED ORDER — FAMOTIDINE 20 MG PO TABS
20.0000 mg | ORAL_TABLET | Freq: Once | ORAL | Status: AC
  Administered 2016-07-03: 20 mg via ORAL

## 2016-07-03 MED ORDER — ONDANSETRON HCL 4 MG/2ML IJ SOLN
4.0000 mg | Freq: Four times a day (QID) | INTRAMUSCULAR | Status: DC | PRN
  Administered 2016-07-03: 4 mg via INTRAVENOUS
  Filled 2016-07-03: qty 2

## 2016-07-03 MED ORDER — OXYCODONE HCL 5 MG PO TABS
5.0000 mg | ORAL_TABLET | ORAL | Status: DC | PRN
  Administered 2016-07-03: 5 mg via ORAL
  Administered 2016-07-03: 10 mg via ORAL
  Administered 2016-07-03 (×2): 5 mg via ORAL
  Administered 2016-07-03 – 2016-07-04 (×4): 10 mg via ORAL
  Filled 2016-07-03: qty 2
  Filled 2016-07-03 (×2): qty 1
  Filled 2016-07-03 (×2): qty 2
  Filled 2016-07-03: qty 1
  Filled 2016-07-03 (×2): qty 2

## 2016-07-03 MED ORDER — GLYCOPYRROLATE 0.2 MG/ML IJ SOLN
INTRAMUSCULAR | Status: DC | PRN
  Administered 2016-07-03: 0.2 mg via INTRAVENOUS

## 2016-07-03 MED ORDER — MAGNESIUM HYDROXIDE 400 MG/5ML PO SUSP
30.0000 mL | Freq: Every day | ORAL | Status: DC | PRN
  Administered 2016-07-05 – 2016-07-06 (×2): 30 mL via ORAL
  Filled 2016-07-03 (×3): qty 30

## 2016-07-03 MED ORDER — NEOMYCIN-POLYMYXIN B GU 40-200000 IR SOLN
Status: AC
Start: 2016-07-03 — End: 2016-07-03
  Filled 2016-07-03: qty 20

## 2016-07-03 MED ORDER — ALBUTEROL SULFATE HFA 108 (90 BASE) MCG/ACT IN AERS: 2.0000 | INHALATION_SPRAY | Freq: Four times a day (QID) | RESPIRATORY_TRACT | Status: DC | PRN

## 2016-07-03 MED ORDER — ENOXAPARIN SODIUM 30 MG/0.3ML ~~LOC~~ SOLN
30.0000 mg | Freq: Two times a day (BID) | SUBCUTANEOUS | Status: DC
  Administered 2016-07-04 – 2016-07-06 (×5): 30 mg via SUBCUTANEOUS
  Filled 2016-07-03 (×5): qty 0.3

## 2016-07-03 MED ORDER — PROPOFOL 500 MG/50ML IV EMUL
INTRAVENOUS | Status: DC | PRN
  Administered 2016-07-03: 25 ug/kg/min via INTRAVENOUS

## 2016-07-03 MED ORDER — DIPHENHYDRAMINE HCL 12.5 MG/5ML PO ELIX: 12.5000 mg | ORAL_SOLUTION | ORAL | Status: DC | PRN

## 2016-07-03 MED ORDER — ATORVASTATIN CALCIUM 10 MG PO TABS
10.0000 mg | ORAL_TABLET | Freq: Every evening | ORAL | Status: DC
  Administered 2016-07-03 – 2016-07-05 (×3): 10 mg via ORAL
  Filled 2016-07-03 (×3): qty 1

## 2016-07-03 MED ORDER — METHOCARBAMOL 500 MG PO TABS
500.0000 mg | ORAL_TABLET | Freq: Four times a day (QID) | ORAL | Status: DC | PRN
  Administered 2016-07-03: 500 mg via ORAL
  Filled 2016-07-03: qty 1

## 2016-07-03 MED ORDER — ACETAMINOPHEN 650 MG RE SUPP: 650.0000 mg | Freq: Four times a day (QID) | RECTAL | Status: DC | PRN

## 2016-07-03 MED ORDER — PHENOL 1.4 % MT LIQD
1.0000 | OROMUCOSAL | Status: DC | PRN
  Filled 2016-07-03: qty 177

## 2016-07-03 MED ORDER — VENLAFAXINE HCL ER 75 MG PO CP24
225.0000 mg | ORAL_CAPSULE | Freq: Every day | ORAL | Status: DC
  Administered 2016-07-04 – 2016-07-06 (×3): 225 mg via ORAL
  Filled 2016-07-03 (×3): qty 3

## 2016-07-03 MED ORDER — ACETAMINOPHEN 325 MG PO TABS
650.0000 mg | ORAL_TABLET | Freq: Four times a day (QID) | ORAL | Status: DC | PRN
  Administered 2016-07-03: 650 mg via ORAL
  Filled 2016-07-03: qty 2

## 2016-07-03 MED ORDER — SODIUM CHLORIDE 0.9 % IV SOLN
INTRAVENOUS | Status: DC
  Administered 2016-07-03 (×2): via INTRAVENOUS

## 2016-07-03 MED ORDER — ZOLPIDEM TARTRATE 5 MG PO TABS
5.0000 mg | ORAL_TABLET | Freq: Every evening | ORAL | Status: DC | PRN
  Administered 2016-07-03 – 2016-07-04 (×2): 5 mg via ORAL
  Filled 2016-07-03 (×3): qty 1

## 2016-07-03 MED ORDER — FENTANYL CITRATE (PF) 100 MCG/2ML IJ SOLN
INTRAMUSCULAR | Status: DC | PRN
  Administered 2016-07-03: 50 ug via INTRAVENOUS

## 2016-07-03 MED ORDER — METOPROLOL TARTRATE 50 MG PO TABS
50.0000 mg | ORAL_TABLET | Freq: Every evening | ORAL | Status: DC
  Administered 2016-07-03 – 2016-07-05 (×3): 50 mg via ORAL
  Filled 2016-07-03 (×3): qty 1

## 2016-07-03 MED ORDER — CEFAZOLIN SODIUM-DEXTROSE 2-4 GM/100ML-% IV SOLN
2.0000 g | Freq: Once | INTRAVENOUS | Status: AC
  Administered 2016-07-03: 2 g via INTRAVENOUS

## 2016-07-03 MED ORDER — OXYCODONE HCL 5 MG/5ML PO SOLN: 5.0000 mg | Freq: Once | ORAL | Status: DC | PRN

## 2016-07-03 MED ORDER — BUPIVACAINE-EPINEPHRINE (PF) 0.25% -1:200000 IJ SOLN
INTRAMUSCULAR | Status: DC | PRN
  Administered 2016-07-03: 30 mL

## 2016-07-03 MED ORDER — BUPIVACAINE HCL (PF) 0.5 % IJ SOLN
INTRAMUSCULAR | Status: DC | PRN
  Administered 2016-07-03: 3 mL via INTRATHECAL

## 2016-07-03 MED ORDER — ONDANSETRON HCL 4 MG PO TABS
4.0000 mg | ORAL_TABLET | Freq: Four times a day (QID) | ORAL | Status: DC | PRN
  Administered 2016-07-04 (×2): 4 mg via ORAL
  Filled 2016-07-03 (×2): qty 1

## 2016-07-03 MED ORDER — BISACODYL 10 MG RE SUPP
10.0000 mg | Freq: Every day | RECTAL | Status: DC | PRN
  Administered 2016-07-06: 10 mg via RECTAL
  Filled 2016-07-03: qty 1

## 2016-07-03 MED ORDER — SODIUM CHLORIDE 0.9 % IV SOLN
INTRAVENOUS | Status: DC | PRN
  Administered 2016-07-03: 60 mL

## 2016-07-03 MED ORDER — ALBUTEROL SULFATE (2.5 MG/3ML) 0.083% IN NEBU: 2.5000 mg | INHALATION_SOLUTION | RESPIRATORY_TRACT | Status: DC | PRN

## 2016-07-03 MED ORDER — SODIUM CHLORIDE 0.9 % IJ SOLN
INTRAMUSCULAR | Status: AC
Start: 2016-07-03 — End: 2016-07-03
  Filled 2016-07-03: qty 100

## 2016-07-03 MED ORDER — TRAMADOL HCL 50 MG PO TABS
100.0000 mg | ORAL_TABLET | Freq: Four times a day (QID) | ORAL | Status: DC
  Administered 2016-07-03 – 2016-07-06 (×10): 100 mg via ORAL
  Filled 2016-07-03 (×11): qty 2

## 2016-07-03 MED ORDER — MIDAZOLAM HCL 5 MG/5ML IJ SOLN
INTRAMUSCULAR | Status: DC | PRN
  Administered 2016-07-03: 1 mg via INTRAVENOUS

## 2016-07-03 MED ORDER — CEFAZOLIN SODIUM-DEXTROSE 2-4 GM/100ML-% IV SOLN
2.0000 g | Freq: Four times a day (QID) | INTRAVENOUS | Status: AC
  Administered 2016-07-03 – 2016-07-04 (×3): 2 g via INTRAVENOUS
  Filled 2016-07-03 (×3): qty 100

## 2016-07-03 SURGICAL SUPPLY — 60 items
BANDAGE ACE 6X5 VEL STRL LF (GAUZE/BANDAGES/DRESSINGS) ×2 IMPLANT
BLADE SAW 1 (BLADE) ×2 IMPLANT
BLOCK CUTTING FEMUR 3 RT MED (MISCELLANEOUS) IMPLANT
BLOCK CUTTING TIBIAL 4 RT (MISCELLANEOUS) IMPLANT
BLOCK CUTTING TIBIAL 4 RT MIS (MISCELLANEOUS) IMPLANT
CANISTER SUCT 1200ML W/VALVE (MISCELLANEOUS) ×2 IMPLANT
CANISTER SUCT 3000ML (MISCELLANEOUS) ×4 IMPLANT
CAPT KNEE TOTAL 3 ×2 IMPLANT
CATH FOL LEG HOLDER (MISCELLANEOUS) ×2 IMPLANT
CATH TRAY METER 16FR LF (MISCELLANEOUS) ×2 IMPLANT
CEMENT HV SMART SET (Cement) ×4 IMPLANT
CHLORAPREP W/TINT 26ML (MISCELLANEOUS) ×4 IMPLANT
COOLER POLAR GLACIER W/PUMP (MISCELLANEOUS) ×2 IMPLANT
CUFF TOURN 24 STER (MISCELLANEOUS) IMPLANT
CUFF TOURN 30 STER DUAL PORT (MISCELLANEOUS) IMPLANT
DRAPE INCISE IOBAN 66X45 STRL (DRAPES) ×4 IMPLANT
DRAPE SHEET LG 3/4 BI-LAMINATE (DRAPES) ×4 IMPLANT
ELECT CAUTERY BLADE 6.4 (BLADE) ×2 IMPLANT
ELECT REM PT RETURN 9FT ADLT (ELECTROSURGICAL) ×2
ELECTRODE REM PT RTRN 9FT ADLT (ELECTROSURGICAL) ×1 IMPLANT
GAUZE PETRO XEROFOAM 1X8 (MISCELLANEOUS) ×2 IMPLANT
GAUZE SPONGE 4X4 12PLY STRL (GAUZE/BANDAGES/DRESSINGS) ×2 IMPLANT
GLOVE BIOGEL PI IND STRL 9 (GLOVE) ×1 IMPLANT
GLOVE BIOGEL PI INDICATOR 9 (GLOVE) ×1
GLOVE INDICATOR 8.0 STRL GRN (GLOVE) ×2 IMPLANT
GLOVE SURG ORTHO 8.0 STRL STRW (GLOVE) ×2 IMPLANT
GLOVE SURG SYN 9.0  PF PI (GLOVE) ×1
GLOVE SURG SYN 9.0 PF PI (GLOVE) ×1 IMPLANT
GOWN SRG 2XL LVL 4 RGLN SLV (GOWNS) ×1 IMPLANT
GOWN STRL NON-REIN 2XL LVL4 (GOWNS) ×1
GOWN STRL REUS W/ TWL LRG LVL3 (GOWN DISPOSABLE) ×1 IMPLANT
GOWN STRL REUS W/ TWL XL LVL3 (GOWN DISPOSABLE) ×1 IMPLANT
GOWN STRL REUS W/TWL LRG LVL3 (GOWN DISPOSABLE) ×1
GOWN STRL REUS W/TWL XL LVL3 (GOWN DISPOSABLE) ×1
HANDPIECE INTERPULSE COAX TIP (DISPOSABLE) ×1
HOOD PEEL AWAY FLYTE STAYCOOL (MISCELLANEOUS) ×4 IMPLANT
IMMBOLIZER KNEE 19 BLUE UNIV (SOFTGOODS) ×2 IMPLANT
KIT RM TURNOVER STRD PROC AR (KITS) ×2 IMPLANT
KNEE MEDACTA TIBIAL/FEMORAL BL (Knees) ×2 IMPLANT
KNIFE SCULPS 14X20 (INSTRUMENTS) ×2 IMPLANT
NDL SAFETY 18GX1.5 (NEEDLE) ×2 IMPLANT
NEEDLE SPNL 18GX3.5 QUINCKE PK (NEEDLE) ×2 IMPLANT
NEEDLE SPNL 20GX3.5 QUINCKE YW (NEEDLE) ×2 IMPLANT
NS IRRIG 1000ML POUR BTL (IV SOLUTION) ×2 IMPLANT
PACK TOTAL KNEE (MISCELLANEOUS) ×2 IMPLANT
PAD WRAPON POLAR KNEE (MISCELLANEOUS) ×1 IMPLANT
SET HNDPC FAN SPRY TIP SCT (DISPOSABLE) ×1 IMPLANT
SOL .9 NS 3000ML IRR  AL (IV SOLUTION) ×1
SOL .9 NS 3000ML IRR UROMATIC (IV SOLUTION) ×1 IMPLANT
STAPLER SKIN PROX 35W (STAPLE) ×2 IMPLANT
SUCTION FRAZIER HANDLE 10FR (MISCELLANEOUS) ×1
SUCTION TUBE FRAZIER 10FR DISP (MISCELLANEOUS) ×1 IMPLANT
SUT DVC 2 QUILL PDO  T11 36X36 (SUTURE) ×1
SUT DVC 2 QUILL PDO T11 36X36 (SUTURE) ×1 IMPLANT
SUT DVC QUILL MONODERM 30X30 (SUTURE) ×2 IMPLANT
SYR 20CC LL (SYRINGE) ×2 IMPLANT
SYR 50ML LL SCALE MARK (SYRINGE) ×4 IMPLANT
TOWEL OR 17X26 4PK STRL BLUE (TOWEL DISPOSABLE) ×2 IMPLANT
TOWER CARTRIDGE SMART MIX (DISPOSABLE) ×2 IMPLANT
WRAPON POLAR PAD KNEE (MISCELLANEOUS) ×2

## 2016-07-03 NOTE — Op Note (Signed)
07/03/2016  9:22 AM  PATIENT:  [name] Douglas  75 y.o. female  PRE-OPERATIVE DIAGNOSIS:  PRIMARY OSTEOARTHRITIS right knee  POST-OPERATIVE DIAGNOSIS:  PRIMARY OSTEOARTHRITIS right knee  PROCEDURE:  Procedure(s): TOTAL KNEE ARTHROPLASTY (Right)  SURGEON: Laurene Footman, MD  ASSISTANTS: Rachelle Hora, PA-C  ANESTHESIA:   spinal  EBL:  Total I/O In: 1400 [I.V.:1400] Out: 200 [Urine:100; Blood:100]  BLOOD ADMINISTERED:none  DRAINS: none   LOCAL MEDICATIONS USED:  MARCAINE    and OTHER morphine, Exparel  SPECIMEN:  No Specimen  DISPOSITION OF SPECIMEN:  N/A  COUNTS:  YES  TOURNIQUET:   24 minutes at 300 mmHg  IMPLANTS: Medacta GMK sphere, 3+ femur, 3 tibia was short stem and 11 mm insert, to patella, all components cemented  DICTATION: .Dragon Dictation patient brought the operating room and after adequate anesthesia was obtained the right leg was prepped and draped in sterile fashion. After patient identification and timeout procedures were completed, a midline skin incision was made followed by medial parapatellar arthrotomy. The knee showed moderate inflammation and advanced medial and lateral compartment osteoarthritis patella also had wear on the patellar surface of the trochlea is relatively spared. The anterior cruciate ligament PCL and fat pad were excised. The proximal tibia is exposed the [place] cutting guide applied and proximal tibia cut carried out. Next the distal femoral cut was made in a similar fashion. The 3+ 4-in-1 cutting guide was applied and anterior posterior and chamfer cuts made. At this point residual meniscal horns posteriorly were excised. Approximately tibia was well exposed and the 3 template was placed pinned into position and proximal drill hole made for keel preparation. The keel punch was placed and the 3+ femur trial placed with an 11 mm insert giving excellent stability. Distal femoral drill holes were made and then the trochlear groove cut  carried out with the reamer. Trials were removed and the patella was cut using the patellar cutting guide after measuring and after drill holes were made and sized to size 2. At this point the local anesthetic above was infiltrated throughout the knee. The tourniquet was raised at this point and the knee irrigated with pulsatile lavage with a dilute Betadine solution. Was thoroughly dried the components were cemented into place and held in extension. After the cement set excess cement was removed and the knee again irrigated pulsatile lavage. The tourniquet is let down and the arthrotomy repaired using a heavy Quill for the capsule. The 3-0 V-loc subcuticular closure and skin staples. Xeroform 4 x 4's ABDs and web roll Ace wrap and Polar Care applied and patient center comes stable condition   PLAN OF CARE: Admit to inpatient   PATIENT DISPOSITION:  PACU - hemodynamically stable.

## 2016-07-03 NOTE — Anesthesia Procedure Notes (Signed)
Spinal  Patient location during procedure: OR Staffing Anesthesiologist: PISCITELLO, JOSEPH K Resident/CRNA: Dima Mini Performed: resident/CRNA  Preanesthetic Checklist Completed: patient identified, site marked, surgical consent, pre-op evaluation, timeout performed, IV checked, risks and benefits discussed and monitors and equipment checked Spinal Block Patient position: sitting Prep: ChloraPrep and site prepped and draped Patient monitoring: heart rate, continuous pulse ox, blood pressure and cardiac monitor Approach: midline Location: L4-5 Injection technique: single-shot Needle Needle type: Whitacre and Introducer  Needle gauge: 24 G Needle length: 9 cm Additional Notes Negative paresthesia. Negative blood return. Positive free-flowing CSF. Expiration date of kit checked and confirmed. Patient tolerated procedure well, without complications.       

## 2016-07-03 NOTE — H&P (Signed)
Reviewed paper H+P, will be scanned into chart. No changes noted.  

## 2016-07-03 NOTE — Transfer of Care (Signed)
Immediate Anesthesia Transfer of Care Note  Patient: COREENA TASSY  Procedure(s) Performed: Procedure(s): TOTAL KNEE ARTHROPLASTY (Right)  Patient Location: PACU  Anesthesia Type:Spinal  Level of Consciousness: awake  Airway & Oxygen Therapy: Patient Spontanous Breathing and Patient connected to face mask oxygen  Post-op Assessment: Report given to RN and Post -op Vital signs reviewed and stable  Post vital signs: Reviewed  Last Vitals:  Vitals:   07/03/16 0621 07/03/16 0924  BP: (!) 161/64 126/60  Pulse: 78 86  Resp: 16 18  Temp: 36.4 C 36.4 C    Last Pain:  Vitals:   07/03/16 0621  TempSrc: Oral  PainSc: 5       Patients Stated Pain Goal: 0 ([id number])  Complications: No apparent anesthesia complications

## 2016-07-03 NOTE — Progress Notes (Signed)
Bone foam being removed. Pt. Cannot tolerate it.Marland Kitchen

## 2016-07-03 NOTE — NC FL2 (Signed)
[place] LEVEL OF CARE SCREENING TOOL     IDENTIFICATION  Patient Name: [name] Douglas Birthdate: 09/16/1940 Sex: female Admission Date (Current Location): 07/03/2016  [place] and [place] Number:  Engineering geologist and Address:  [facility], [address] [place], [place] [address]      Provider Number: [id number]  Attending Physician Name and Address:  Hessie Knows, MD  Relative Name and Phone Number:       Current Level of Care: Hospital Recommended Level of Care: [place] Prior Approval Number:    Date Approved/Denied:   PASRR Number:  ([id number])  Discharge Plan: SNF    Current Diagnoses: Patient Active Problem List   Diagnosis Date Noted  . Primary localized osteoarthritis of right knee 07/03/2016  . Ileus ([place]) 04/24/2016  . Generalized abdominal pain   . Colitis due to Clostridium difficile   . Recurrent Clostridium difficile diarrhea   . Enteritis due to Clostridium difficile 08/22/2014  . Diarrhea 08/20/2014  . HTN (hypertension) 08/20/2014  . HLD (hyperlipidemia) 08/20/2014  . Hypothyroidism 08/20/2014  . Tobacco abuse 08/20/2014  . COPD (chronic obstructive pulmonary disease) ([place]) 08/20/2014  . Back pain 08/20/2014    Orientation RESPIRATION BLADDER Height & Weight     Self, Time, Situation, Place  O2 (Nasal Cannula 1.5/min) External catheter Weight: 216 lb (98 kg) Height:  5\' 6"  (167.6 cm)  BEHAVIORAL SYMPTOMS/MOOD NEUROLOGICAL BOWEL NUTRITION STATUS   (None.)  (None.) Continent Diet (Diet: Regular )  AMBULATORY STATUS COMMUNICATION OF NEEDS Skin   Extensive Assist Verbally Surgical wounds (Incision: Right Knee)                       Personal Care Assistance Level of Assistance  Bathing, Feeding, Dressing Bathing Assistance: Limited assistance Feeding assistance: Independent Dressing Assistance: Limited assistance     Functional Limitations Info  Sight, Hearing, Speech  Sight Info: Adequate Hearing Info: Adequate Speech Info: Impaired (Upper and lower dentures)    SPECIAL CARE FACTORS FREQUENCY  PT (By licensed PT), OT (By licensed OT)     PT Frequency:  (5) OT Frequency:  (5)            Contractures      Additional Factors Info  Code Status, Allergies Code Status Info:  (Full Code) Allergies Info:  (No Known Allergies )           Current Medications (07/03/2016):  This is the current hospital active medication list Current Facility-Administered Medications  Medication Dose Route Frequency Provider Last Rate Last Dose  . 0.9 %  sodium chloride infusion   Intravenous Continuous Hessie Knows, MD 75 mL/hr at 07/03/16 1103    . acetaminophen (TYLENOL) tablet 650 mg  650 mg Oral Q6H PRN Hessie Knows, MD       Or  . acetaminophen (TYLENOL) suppository 650 mg  650 mg Rectal Q6H PRN Hessie Knows, MD      . albuterol (PROVENTIL) (2.5 MG/3ML) 0.083% nebulizer solution 2.5 mg  2.5 mg Nebulization Q4H PRN Hessie Knows, MD      . atorvastatin (LIPITOR) tablet 10 mg  10 mg Oral QPM Hessie Knows, MD      . bisacodyl (DULCOLAX) suppository 10 mg  10 mg Rectal Daily PRN Hessie Knows, MD      . calcium-vitamin D (OSCAL WITH D) 500-200 MG-UNIT per tablet 1 tablet  1 tablet Oral Daily Hessie Knows, MD      . ceFAZolin (  ANCEF) IVPB 2g/100 mL premix  2 g Intravenous Q6H Hessie Knows, MD      . diphenhydrAMINE (BENADRYL) 12.5 MG/5ML elixir 12.5-25 mg  12.5-25 mg Oral Q4H PRN Hessie Knows, MD      . docusate sodium (COLACE) capsule 100 mg  100 mg Oral BID Hessie Knows, MD   100 mg at 07/03/16 1131  . [START ON 07/04/2016] enoxaparin (LOVENOX) injection 30 mg  30 mg Subcutaneous Q12H Hessie Knows, MD      . famotidine (PEPCID) 20 MG tablet           . gabapentin (NEURONTIN) capsule 100 mg  100 mg Oral TID Hessie Knows, MD   100 mg at 07/03/16 1131  . [START ON 07/04/2016] Influenza vac split quadrivalent PF (FLUARIX) injection 0.5 mL  0.5 mL Intramuscular  Tomorrow-1000 Hessie Knows, MD      . levothyroxine (SYNTHROID, LEVOTHROID) tablet 88 mcg  88 mcg Oral QAC breakfast Hessie Knows, MD      . magnesium citrate solution 1 Bottle  1 Bottle Oral Once PRN Hessie Knows, MD      . magnesium hydroxide (MILK OF MAGNESIA) suspension 30 mL  30 mL Oral Daily PRN Hessie Knows, MD      . menthol-cetylpyridinium (CEPACOL) lozenge 3 mg  1 lozenge Oral PRN Hessie Knows, MD       Or  . phenol (CHLORASEPTIC) mouth spray 1 spray  1 spray Mouth/Throat PRN Hessie Knows, MD      . methocarbamol (ROBAXIN) tablet 500 mg  500 mg Oral Q6H PRN Hessie Knows, MD   500 mg at 07/03/16 1207   Or  . methocarbamol (ROBAXIN) 500 mg in dextrose 5 % 50 mL IVPB  500 mg Intravenous Q6H PRN Hessie Knows, MD      . metoCLOPramide (REGLAN) tablet 5-10 mg  5-10 mg Oral Q8H PRN Hessie Knows, MD       Or  . metoCLOPramide (REGLAN) injection 5-10 mg  5-10 mg Intravenous Q8H PRN Hessie Knows, MD      . metoprolol (LOPRESSOR) tablet 50 mg  50 mg Oral QPM Hessie Knows, MD      . morphine 2 MG/ML injection 2 mg  2 mg Intravenous Q1H PRN Hessie Knows, MD   2 mg at 07/03/16 1311  . ondansetron (ZOFRAN) tablet 4 mg  4 mg Oral Q6H PRN Hessie Knows, MD       Or  . ondansetron [facility]) injection 4 mg  4 mg Intravenous Q6H PRN Hessie Knows, MD      . oxyCODONE (Oxy IR/ROXICODONE) immediate release tablet 5-10 mg  5-10 mg Oral Q3H PRN Hessie Knows, MD   5 mg at 07/03/16 1207  . tiotropium (SPIRIVA) inhalation capsule 18 mcg  18 mcg Inhalation Daily Hessie Knows, MD   18 mcg at 07/03/16 1131  . venlafaxine XR (EFFEXOR-XR) 24 hr capsule 225 mg  225 mg Oral Q breakfast Hessie Knows, MD      . zolpidem [facility]) tablet 5 mg  5 mg Oral QHS PRN Hessie Knows, MD         Discharge Medications: Please see discharge summary for a list of discharge medications.  Relevant Imaging Results:  Relevant Lab Results:   Additional Information  (SSN: [id number])  Danie Chandler, Student-Social  Work

## 2016-07-03 NOTE — Evaluation (Signed)
Physical Therapy Evaluation Patient Details Name: [name] Douglas MRN: [id number] DOB: [birth year] Today's Date: 07/03/2016   History of Present Illness  admitted for acute hospitalization status post R TKR, WBAT, 07/03/16.  Clinical Impression  Upon evaluation, patient alert and oriented; slightly anxious, but agreeable to session/evaluation.  Rates R knee pain at 8/10 (meds received prior to/during session); eager for repositioning and mobility as tolerated.  Demonstrates fair post-op strength (at least 3-/5) and ROM (7-63 degrees) in R knee; generally guarded due to pain.  Able to complete x10 SLR with minimal assist from PT; expect continued improvement as pain controlled.  Completes sit/supine with min/mod assist, requiring assist from therapist for movement/positioning of R LE. Unable to tolerate additional standing/OOB attempts due to nausea/vomiting with sitting edge of bed.  Returned to supine and positioned to comfort; RN informed/aware and at bedside end of session for medication administration as needed.  Will continue to progress mobility next date as appropriate. Would benefit from skilled PT to address above deficits and promote optimal return to PLOF; recommend transition to STR upon discharge from acute hospitalization.     Follow Up Recommendations SNF    Equipment Recommendations       Recommendations for Other Services       Precautions / Restrictions Precautions Precautions: Fall Restrictions Weight Bearing Restrictions: Yes RLE Weight Bearing: Weight bearing as tolerated      Mobility  Bed Mobility Overal bed mobility: Needs Assistance Bed Mobility: Supine to Sit;Sit to Supine     Supine to sit: Min assist;Mod assist Sit to supine: Mod assist   General bed mobility comments: assist for R LE management; fearful/anxious of flexion in short-sitting position  Transfers                 General transfer comment: unable to tolerate due to nausea/vomiting  during session  Ambulation/Gait             General Gait Details: unable to tolerate due to nausea/vomiting during session  Stairs            Wheelchair Mobility    Modified Rankin (Stroke Patients Only)       Balance Overall balance assessment: Needs assistance Sitting-balance support: No upper extremity supported;Feet supported Sitting balance-Leahy Scale: Good         Standing balance comment: unable to tolerate due to nausea/vomiting during session                             Pertinent Vitals/Pain Pain Assessment: 0-10 Pain Score: 8  Pain Location: R LE Pain Descriptors / Indicators: Grimacing;Guarding    Home Living Family/patient expects to be discharged to:: Private residence Living Arrangements: Alone Available Help at Discharge: Family;Available PRN/intermittently Type of Home: Apartment Home Access: Elevator (does have 5 steps with single rail to enter/exit apartment building)     Home Layout: One level Home Equipment: Walker - 2 wheels;Cane - single point      Prior Function Level of Independence: Independent         Comments: Indep with ADLS, household and limited community mobility without assist device; + driving.  Does endorse single fall within previous six months.     Hand Dominance   Dominant Hand: Right    Extremity/Trunk Assessment   Upper Extremity Assessment: Overall WFL for tasks assessed           Lower Extremity Assessment: Generalized weakness (R LE very guarded due  to pain; demonstrates strength at least 3-/5 with full sensory return.  L LE grossly WFL)         Communication   Communication: No difficulties  Cognition Arousal/Alertness: Awake/alert Behavior During Therapy: WFL for tasks assessed/performed Overall Cognitive Status: Within Functional Limits for tasks assessed                      General Comments      Exercises Total Joint Exercises Goniometric ROM: 7-63 degrees,  act assist ROM Other Exercises Other Exercises: Supine R LE therex, 1x10, act assist ROM: ankle pumps, quad sets, heel slides, hip abduct/adduct and SLR.  Fair quad activation noted; very guarded due to pain.  Was able to complete x10 SLR with minimal assist from therapist   Assessment/Plan    PT Assessment Patient needs continued PT services  PT Problem List Decreased strength;Decreased range of motion;Decreased activity tolerance;Decreased balance;Decreased mobility;Decreased coordination;Decreased knowledge of use of DME;Decreased safety awareness;Decreased knowledge of precautions;Obesity;Decreased skin integrity;Pain          PT Treatment Interventions DME instruction;Stair training;Gait training;Functional mobility training;Therapeutic activities;Therapeutic exercise;Balance training;Patient/family education    PT Goals (Current goals can be found in the Care Plan section)  Acute Rehab PT Goals Patient Stated Goal: to do what I need to do to get better PT Goal Formulation: With patient/family Time For Goal Achievement: 07/17/16 Potential to Achieve Goals: Good    Frequency BID   Barriers to discharge Decreased caregiver support      Co-evaluation               End of Session   Activity Tolerance:  (limited by nausea/vomiting with transition to upright) Patient left: in bed;with call bell/phone within reach;with bed alarm set;with family/visitor present;with nursing/sitter in room Nurse Communication: Mobility status         Time: [id number] PT Time Calculation (min) (ACUTE ONLY): 37 min   Charges:   PT Evaluation $PT Eval Low Complexity: 1 Procedure PT Treatments $Therapeutic Exercise: 8-22 mins   PT G Codes:        Nobie Alleyne H. Owens Shark, PT, DPT, NCS 07/03/16, 10:19 PM [phone]

## 2016-07-03 NOTE — Anesthesia Preprocedure Evaluation (Signed)
Anesthesia Evaluation  Patient identified by MRN, date of birth, ID band Patient awake    Reviewed: Allergy & Precautions, H&P , NPO status , Patient's Chart, lab work & pertinent test results  History of Anesthesia Complications (+) Emergence Delirium and history of anesthetic complications  Airway Mallampati: III  TM Distance: <3 FB Neck ROM: limited    Dental no notable dental hx. (+) Poor Dentition, Missing, Edentulous Lower   Pulmonary shortness of breath and with exertion, asthma , COPD, Current Smoker,    Pulmonary exam normal breath sounds clear to auscultation       Cardiovascular Exercise Tolerance: Good hypertension, (-) angina(-) Past MI Normal cardiovascular exam+ Valvular Problems/Murmurs AI  Rhythm:regular Rate:Normal     Neuro/Psych PSYCHIATRIC DISORDERS Anxiety Depression negative neurological ROS     GI/Hepatic Neg liver ROS, GERD  Controlled,  Endo/Other  negative endocrine ROSHypothyroidism   Renal/GU      Musculoskeletal  (+) Arthritis ,   Abdominal   Peds  Hematology negative hematology ROS (+)   Anesthesia Other Findings Past Medical History: No date: Anxiety No date: Arthritis     Comment: "shoulders" (08/20/2014) No date: Asthma No date: Chronic lower back pain No date: Complication of anesthesia     Comment: "agitated & restless after knee replacement in              2008" No date: COPD (chronic obstructive pulmonary disease) (* No date: DDD (degenerative disc disease), lumbar No date: Depression No date: Diverticulosis No date: GERD (gastroesophageal reflux disease) No date: High cholesterol No date: Hypertension No date: Hypothyroidism No date: On home oxygen therapy     Comment: "2L at night" (08/20/2014) No date: Shortness of breath dyspnea     Comment: with exertion  Past Surgical History: ~ 2007: CARDIAC CATHETERIZATION 1960's: EXCISIONAL HEMORRHOIDECTOMY No date: EYE  SURGERY Bilateral     Comment: Cataract extraction with IOL 2009: JOINT REPLACEMENT Left X 2 <2008: KNEE ARTHROSCOPY Left 04/19/2016: KNEE ARTHROSCOPY WITH SUBCHONDROPLASTY Right     Comment: Procedure: KNEE ARTHROSCOPY WITH               SUBCHONDROPLASTY, PARTIAL MENISCECTOMY;                Surgeon: Hessie Knows, MD;  Location: [facility] ORS;              Service: Orthopedics;  Laterality: Right; 2001: SHOULDER OPEN ROTATOR CUFF REPAIR Right ~ 1950: TONSILLECTOMY 2008: TOTAL KNEE ARTHROPLASTY Left 1973: TUBAL LIGATION 1974: VAGINAL HYSTERECTOMY  BMI    Body Mass Index:  34.86 kg/m      Reproductive/Obstetrics negative OB ROS                             Anesthesia Physical Anesthesia Plan  ASA: III  Anesthesia Plan: Spinal   Post-op Pain Management:    Induction:   Airway Management Planned:   Additional Equipment:   Intra-op Plan:   Post-operative Plan:   Informed Consent: I have reviewed the patients History and Physical, chart, labs and discussed the procedure including the risks, benefits and alternatives for the proposed anesthesia with the patient or authorized representative who has indicated his/her understanding and acceptance.     Plan Discussed with: Anesthesiologist, CRNA and Surgeon  Anesthesia Plan Comments:         Anesthesia Quick Evaluation

## 2016-07-03 NOTE — Progress Notes (Signed)
[place] making rounding visited Pt. Pt was with her daughters in the room. [place] introduced himself and informed Pt that [facility] services were available, and then [facility] left.     07/03/16 1500  Clinical Encounter Type  Visited With Patient and family together  Visit Type Initial  Referral From Nurse  Consult/Referral To Chaplain  Spiritual Encounters  Spiritual Needs Prayer

## 2016-07-04 ENCOUNTER — Encounter: Payer: Self-pay | Admitting: Orthopedic Surgery

## 2016-07-04 LAB — BASIC METABOLIC PANEL
Anion gap: 6 (ref 5–15)
BUN: 9 mg/dL (ref 6–20)
CO2: 31 mmol/L (ref 22–32)
Calcium: 8.5 mg/dL — ABNORMAL LOW (ref 8.9–10.3)
Chloride: 100 mmol/L — ABNORMAL LOW (ref 101–111)
Creatinine, Ser: 0.8 mg/dL (ref 0.44–1.00)
GFR calc Af Amer: >60 mL/min (ref >60)
GFR calc non Af Amer: >60 mL/min (ref >60)
Glucose, Bld: 125 mg/dL — ABNORMAL HIGH (ref 65–99)
Potassium: 4.5 mmol/L (ref 3.5–5.1)
Sodium: 137 mmol/L (ref 135–145)

## 2016-07-04 LAB — CBC
HCT: 32.7 % — ABNORMAL LOW (ref 35.0–47.0)
Hemoglobin: 10.9 g/dL — ABNORMAL LOW (ref 12.0–16.0)
MCH: 28.9 pg (ref 26.0–34.0)
MCHC: 33.3 g/dL (ref 32.0–36.0)
MCV: 86.7 fL (ref 80.0–100.0)
Platelets: 223 10*3/uL (ref 150–440)
RBC: 3.77 MIL/uL — ABNORMAL LOW (ref 3.80–5.20)
RDW: 14.8 % — ABNORMAL HIGH (ref 11.5–14.5)
WBC: 9.7 10*3/uL (ref 3.6–11.0)

## 2016-07-04 MED ORDER — HYDROCODONE-ACETAMINOPHEN 10-325 MG PO TABS
1.0000 | ORAL_TABLET | ORAL | Status: DC | PRN
  Administered 2016-07-04 – 2016-07-05 (×3): 2 via ORAL
  Filled 2016-07-04 (×3): qty 2

## 2016-07-04 NOTE — Progress Notes (Signed)
  Subjective: 1 Day Post-Op Procedure(s) (LRB): TOTAL KNEE ARTHROPLASTY (Right) Patient reports pain as moderate.   Patient seen in rounds with Dr. Rudene Christians. Patient is well, and has had no acute complaints or problems Plan is to go Rehab after hospital stay. Negative for chest pain and shortness of breath Fever: no Gastrointestinal: Negative for nausea and vomiting  Objective: Vital signs in last 24 hours: Temp:  [97.5 F (36.4 C)-99.9 F (37.7 C)] 99.2 F (37.3 C) (11/15 0430) Pulse Rate:  [72-93] 80 (11/15 0430) Resp:  [16-20] 19 (11/15 0430) BP: (118-161)/(47-89) 157/68 (11/15 0430) SpO2:  [89 %-100 %] 89 % (11/15 0430) FiO2 (%):  [21 %] 21 % (11/14 1049) Weight:  [98 kg (216 lb)] 98 kg (216 lb) (11/14 0621)  Intake/Output from previous day:  Intake/Output Summary (Last 24 hours) at 07/04/16 0606 Last data filed at 07/04/16 0538  Gross per 24 hour  Intake          3706.25 ml  Output             3715 ml  Net            -8.75 ml    Intake/Output this shift: Total I/O In: 1190 [I.V.:1190] Out: 2625 [Urine:2625]  Labs:  Recent Labs  07/03/16 0625 07/04/16 0407  HGB 11.9* 10.9*    Recent Labs  07/03/16 0625 07/04/16 0407  WBC 8.8 9.7  RBC 4.15 3.77*  HCT 36.1 32.7*  PLT 252 223    Recent Labs  07/03/16 0625 07/04/16 0407  NA  --  137  K  --  4.5  CL  --  100*  CO2  --  31  BUN  --  9  CREATININE 0.72 0.80  GLUCOSE  --  125*  CALCIUM  --  8.5*   No results for input(s): LABPT, INR in the last 72 hours.   EXAM General - Patient is Alert and Oriented Extremity - Sensation intact distally Dorsiflexion/Plantar flexion intact No cellulitis present Compartment soft Dressing/Incision - clean, dry, no drainage Motor Function - intact, moving foot and toes well on exam.   Past Medical History:  Diagnosis Date  . Anxiety   . Arthritis    "shoulders" (08/20/2014)  . Asthma   . Chronic lower back pain   . Complication of anesthesia    "agitated &  restless after knee replacement in 2008"  . COPD (chronic obstructive pulmonary disease) ([address])   . DDD (degenerative disc disease), lumbar   . Depression   . Diverticulosis   . GERD (gastroesophageal reflux disease)   . High cholesterol   . Hypertension   . Hypothyroidism   . On home oxygen therapy    "2L at night" (08/20/2014)  . Shortness of breath dyspnea    with exertion    Assessment/Plan: 1 Day Post-Op Procedure(s) (LRB): TOTAL KNEE ARTHROPLASTY (Right) Active Problems:   Primary localized osteoarthritis of right knee  Estimated body mass index is 34.86 kg/m as calculated from the following:   Height as of this encounter: 5\' 6"  (1.676 m).   Weight as of this encounter: 98 kg (216 lb). Advance diet Up with therapy D/C IV fluids  DVT Prophylaxis - Lovenox, Foot Pumps and TED hose Weight-Bearing as tolerated to right leg  Reche Dixon, PA-C Orthopaedic Surgery 07/04/2016, 6:06 AM

## 2016-07-04 NOTE — Progress Notes (Signed)
Physical Therapy Treatment Patient Details Name: [name] Douglas MRN: [id number] DOB: 11-Jan-1941 Today's Date: 07/04/2016    History of Present Illness admitted for acute hospitalization status post R TKR, WBAT, 07/03/16.    PT Comments    Beginning to move with more confidence and fluidity, completing with min/mod assist +1 (second person stand-by for safety); continues to rate pain 8-9/10 in R knee despite pre-medication.  Demonstrates fair/good post-op strength and control despite pain reports; no buckling with WBing efforts. Will continue to progress gait efforts as appropriate.   Follow Up Recommendations  SNF     Equipment Recommendations       Recommendations for Other Services       Precautions / Restrictions Precautions Precautions: Fall Restrictions Weight Bearing Restrictions: Yes RLE Weight Bearing: Weight bearing as tolerated    Mobility  Bed Mobility Overal bed mobility: Needs Assistance Bed Mobility: Supine to Sit     Supine to sit: Min assist     General bed mobility comments: improving ability to self-manage R LE  Transfers Overall transfer level: Needs assistance Equipment used: Rolling walker (2 wheeled) Transfers: Sit to/from Stand Sit to Stand: Min assist;+2 safety/equipment         General transfer comment: improved effort and fluidity with movement transition; continues with limited use/WBing of R LE  Ambulation/Gait Ambulation/Gait assistance: Min assist;+2 safety/equipment (chair follow for safety) Ambulation Distance (Feet): 15 Feet Assistive device: Rolling walker (2 wheeled)       General Gait Details: step to gait pattern with decreased stance time/weight acceptance R LE; no buckling or LOB.  Frequent cuing for walker position and postural extension, tends to try to drape upper body over RW at times.   Stairs            Wheelchair Mobility    Modified Rankin (Stroke Patients Only)       Balance Overall balance  assessment: Needs assistance Sitting-balance support: No upper extremity supported;Feet supported Sitting balance-Leahy Scale: Good     Standing balance support: Bilateral upper extremity supported Standing balance-Leahy Scale: Fair                      Cognition Arousal/Alertness: Awake/alert Behavior During Therapy: WFL for tasks assessed/performed;Anxious Overall Cognitive Status: Within Functional Limits for tasks assessed                      Exercises Total Joint Exercises Goniometric ROM: 7-71 degrees, act assist ROM Other Exercises Other Exercises: Toilet transfer, SPT with RW, min assist (x2) during session; sit/stand from [facility] with RW, min assist; static stance for hygiene with RW, cga/min assist-cuing to maintain hand placement on RW for external stabilization.    General Comments        Pertinent Vitals/Pain Pain Assessment: 0-10 Pain Score: 8  Pain Location: R LE Pain Descriptors / Indicators: Grimacing;Guarding;Aching Pain Intervention(s): Limited activity within patient's tolerance;Monitored during session;Premedicated before session;Repositioned    Home Living                      Prior Function            PT Goals (current goals can now be found in the care plan section) Acute Rehab PT Goals Patient Stated Goal: to do what I need to do to get better PT Goal Formulation: With patient/family Time For Goal Achievement: 07/17/16 Potential to Achieve Goals: Good Progress towards PT goals: Progressing toward goals  Frequency    BID      PT Plan Current plan remains appropriate    Co-evaluation             End of Session Equipment Utilized During Treatment: Gait belt Activity Tolerance: Patient tolerated treatment well Patient left: in chair;with call bell/phone within reach;with chair alarm set     Time: [id number] PT Time Calculation (min) (ACUTE ONLY): 24 min  Charges:  $Gait Training: 8-22  mins $Therapeutic Activity: 8-22 mins                    G Codes:      Ellison Hughs 08-03-2016, 4:47 PM

## 2016-07-04 NOTE — Progress Notes (Signed)
Foley d./c'd at 0530 

## 2016-07-04 NOTE — Clinical Social Work Note (Signed)
Clinical Social Work Assessment  Patient Details  Name: [name] Douglas MRN: [id number] Date of Birth: 22-Nov-1940  Date of referral:  07/04/16               Reason for consult:  Facility Placement                Permission sought to share information with:  Chartered certified accountant granted to share information::  Yes, Verbal Permission Granted  Name::      City of [place]::   [place]   Relationship::     Contact Information:     Housing/Transportation Living arrangements for the past 2 months:  Single Family Home Source of Information:  Patient, Adult Children Patient Interpreter Needed:  None Criminal Activity/Legal Involvement Pertinent to Current Situation/Hospitalization:  No - Comment as needed Significant Relationships:  Adult Children, Other Family Members Lives with:  Self Do you feel safe going back to the place where you live?  Yes Need for family participation in patient care:  Yes (Comment)  Care giving concerns:  Patient lives alone in [place].    Social Worker assessment / plan:  Holiday representative (CSW) received SNF consult. PT is recommending SNF. CSW met with patient and her daughter Larene Beach and granddaughter were at bedside. CSW introduced self and explained role of CSW department. Patient was alert and oriented and sitting up in the chair. Patient reported that she lives alone in [place]. CSW explained SNF process. Patient is agreeable to SNF search in [facility]   FL2 complete and faxed out. CSW presented bed offers. Patient and daughter chose [facility] admissions coordinator at [facility] is aware of accepted bed offer. CSW will continue to follow and assist as needed.   Employment status:  Retired Forensic scientist:  Information systems manager, Medicaid In [place] PT Recommendations:  [place] / Referral to community resources:  [place]  Patient/Family's  Response to care:  Patient and daughter are agreeable for patient to go to [facility].   Patient/Family's Understanding of and Emotional Response to Diagnosis, Current Treatment, and Prognosis: Patient and daughter were pleasant and thanked CSW for assistance.   Emotional Assessment Appearance:  Appears stated age Attitude/Demeanor/Rapport:    Affect (typically observed):  Accepting, Adaptable, Pleasant Orientation:  Oriented to Self, Oriented to Place, Oriented to  Time, Oriented to Situation Alcohol / Substance use:  Not Applicable Psych involvement (Current and /or in the community):  No (Comment)  Discharge Needs  Concerns to be addressed:  Discharge Planning Concerns Readmission within the last 30 days:  No Current discharge risk:  Dependent with Mobility Barriers to Discharge:  Continued Medical Work up   [employer], Veronia Beets, LCSW 07/04/2016, 1:01 PM

## 2016-07-04 NOTE — Evaluation (Signed)
Occupational Therapy Evaluation Patient Details Name: [name] Douglas MRN: [id number] DOB: [birth year] Today's Date: 07/04/2016    History of Present Illness Pt. is a 75 y.o. female who was admitted to [facility] for a right TKR.   Clinical Impression   Pt. Is a 75 y.o. female who was admitted for a right TKR. Pt presents with limited ROM, pain, weakness, and impaired functional mobility which hinder her ability to complete ADL and IADL tasks. Pt. could benefit from skilled OT services to review A/E use for LE ADLs, to review necessary home modifications,energy conservation/work simplififcation, and to improve functional mobility for ADL/IADLs in order to work towards regaining Independence with ADL/IADLs.     Follow Up Recommendations  SNF    Equipment Recommendations       Recommendations for Other Services       Precautions / Restrictions Precautions Precautions: Fall Restrictions Weight Bearing Restrictions: Yes RLE Weight Bearing: Weight bearing as tolerated                                                     ADL Overall ADL's : Needs assistance/impaired Eating/Feeding: Set up   Grooming: Set up               Lower Body Dressing: Maximal assistance                 General ADL Comments: Pt./family education was provided about A/E use for LE ADLs.     Vision     Perception     Praxis      Pertinent Vitals/Pain Pain Assessment: 0-10 Pain Score: 5  Pain Location: RLE Pain Descriptors / Indicators: Guarding Pain Intervention(s): Limited activity within patient's tolerance;Repositioned     Hand Dominance Right   Extremity/Trunk Assessment Upper Extremity Assessment Upper Extremity Assessment: Defer to OT evaluation           Communication Communication Communication: No difficulties   Cognition Arousal/Alertness: Awake/alert Behavior During Therapy: WFL for tasks assessed/performed Overall Cognitive Status: Within  Functional Limits for tasks assessed                     General Comments       Exercises       Shoulder Instructions      Home Living Family/patient expects to be discharged to:: Private residence Living Arrangements: Alone Available Help at Discharge: Family Type of Home: Apartment Home Access: Elevator     Home Layout: One level     Bathroom Shower/Tub: Tub/shower unit;Curtain Shower/tub characteristics: Curtain       Home Equipment: Environmental consultant - 2 wheels;Cane - single point          Prior Functioning/Environment Level of Independence: Independent                 OT Problem List: Decreased strength;Decreased range of motion;Pain;Decreased knowledge of use of DME or AE;Decreased activity tolerance   OT Treatment/Interventions: Self-care/ADL training;Therapeutic exercise;Energy conservation;DME and/or AE instruction;Patient/family education;Therapeutic activities    OT Goals(Current goals can be found in the care plan section)    OT Frequency: Min 1X/week   Barriers to D/C:            Co-evaluation              End of Session    Activity  Tolerance: No increased pain Patient left:  In chair, call bell in reach, family, and nursing present   Time: [phone] OT Time Calculation (min): 28 min Charges:  OT General Charges $OT Visit: 1 Procedure OT Evaluation $OT Eval Moderate Complexity: 1 Procedure OT Treatments $Self Care/Home Management : 8-22 mins G-Codes:    Harrel Carina, MS, OTR/L 07/04/2016, 11:07 AM

## 2016-07-04 NOTE — Anesthesia Postprocedure Evaluation (Signed)
Anesthesia Post Note  Patient: [name] Douglas  Procedure(s) Performed: Procedure(s) (LRB): TOTAL KNEE ARTHROPLASTY (Right)  Patient location during evaluation: Nursing Unit Anesthesia Type: Spinal Level of consciousness: awake and alert and oriented Pain management: pain level controlled Vital Signs Assessment: post-procedure vital signs reviewed and stable Respiratory status: respiratory function stable Cardiovascular status: blood pressure returned to baseline Postop Assessment: no headache, no backache, spinal receding, no signs of nausea or vomiting, adequate PO intake and patient able to bend at knees Anesthetic complications: no    Last Vitals:  Vitals:   07/03/16 1942 07/04/16 0430  BP: (!) 156/47 (!) 157/68  Pulse: 87 80  Resp: 19 19  Temp: 37.7 C 37.3 C    Last Pain:  Vitals:   07/04/16 0610  TempSrc:   PainSc: 5                  Blima Singer

## 2016-07-04 NOTE — Progress Notes (Signed)
Physical Therapy Treatment Patient Details Name: [name] Douglas MRN: [id number] DOB: 1940/08/23 Today's Date: 07/04/2016    History of Present Illness admitted for acute hospitalization status post R TKR, WBAT, 07/03/16.    PT Comments    Patient remains very guarded, limited by pain in R knee.  Was able to complete sit/stand and bed/chair with RW this date, but requires mod assist +2 for safety.  Somewhat impulsive with movement efforts, as patient tends to cease activity/attempt sit in response to pain with WBing efforts.  RN informed/aware; to administer pain meds as appropriate. Will continue to progress mobility as able.   Follow Up Recommendations  SNF     Equipment Recommendations       Recommendations for Other Services       Precautions / Restrictions Precautions Precautions: Fall Restrictions Weight Bearing Restrictions: Yes RLE Weight Bearing: Weight bearing as tolerated    Mobility  Bed Mobility Overal bed mobility: Needs Assistance Bed Mobility: Supine to Sit     Supine to sit: Min assist;Mod assist     General bed mobility comments: very guarded with any/all movement of R LE  Transfers Overall transfer level: Needs assistance Equipment used: Rolling walker (2 wheeled) Transfers: Sit to/from Stand Sit to Stand: Mod assist;+2 physical assistance         General transfer comment: maintains R LE anterior to BOS at all times; tends to cease efforts towards movement transition with any increase in pain.  Requiring +2 for safety/encouragement at this time.  Ambulation/Gait Ambulation/Gait assistance: Mod assist;+2 physical assistance Ambulation Distance (Feet): 5 Feet Assistive device: Rolling walker (2 wheeled)       General Gait Details: 5 steps, bed/chair with RW, min/mod assist +2.  Very short, shuffling steps with decrease stance time/weight shift to R LE; slightly impulsive, eager to get to seating surface due to pain with WBing. No buckling;  fair/good knee control noted.  Unable to tolerate additional distance due to pain.   Stairs            Wheelchair Mobility    Modified Rankin (Stroke Patients Only)       Balance Overall balance assessment: Needs assistance Sitting-balance support: No upper extremity supported;Feet supported Sitting balance-Leahy Scale: Good     Standing balance support: Bilateral upper extremity supported Standing balance-Leahy Scale: Fair                      Cognition Arousal/Alertness: Awake/alert Behavior During Therapy: WFL for tasks assessed/performed;Anxious Overall Cognitive Status: Within Functional Limits for tasks assessed                      Exercises Other Exercises Other Exercises: Sit/stand with RW x3, mod assist +2 for safety-R LE anterior to BOS, limited active use of extremity noted. Poor tolerance for R knee flexion/WBing with any/all movement transitions. Other Exercises: Gentle R knee flex/ext in pain free range in short-sitting, attempting to promote better positioning under BOS for increased active use; poor patient tolerance due to pain.    General Comments        Pertinent Vitals/Pain Pain Assessment: Faces Pain Score: 5  Faces Pain Scale: Hurts whole lot Pain Location: R LE Pain Descriptors / Indicators: Aching;Grimacing;Guarding;Restless Pain Intervention(s): Limited activity within patient's tolerance;Monitored during session;Repositioned;Patient requesting pain meds-RN notified;Premedicated before session    [place] expects to be discharged to:: Private residence Living Arrangements: Alone Available Help at Discharge: Family Type of Home: Apartment  Home Access: Elevator   Home Layout: One level Home Equipment: [place] - 2 wheels;Cane - single point      Prior Function Level of Independence: Independent          PT Goals (current goals can now be found in the care plan section) Acute Rehab PT  Goals Patient Stated Goal: to do what I need to do to get better PT Goal Formulation: With patient/family Time For Goal Achievement: 07/17/16 Potential to Achieve Goals: Good Progress towards PT goals: Progressing toward goals    Frequency    BID      PT Plan Current plan remains appropriate    Co-evaluation             End of Session Equipment Utilized During Treatment: Gait belt Activity Tolerance: Patient limited by pain Patient left: in chair;with call bell/phone within reach;with family/visitor present (OT present for evaluation; to connect chair alarm when complete)     Time: 0927-0950 PT Time Calculation (min) (ACUTE ONLY): 23 min  Charges:  $Therapeutic Activity: 23-37 mins                    G Codes:      Owin Vignola H. Owens Shark, PT, DPT, NCS 07/04/16, 11:14 AM [phone]

## 2016-07-04 NOTE — Clinical Social Work Placement (Addendum)
   CLINICAL SOCIAL WORK PLACEMENT  NOTE  Date:  07/04/2016  Patient Details  Name: [name] Douglas MRN: [id number] Date of Birth: 08-01-1941  Clinical Social Work is seeking post-discharge placement for this patient at the [place] level of care (*CSW will initial, date and re-position this form in  chart as items are completed):  Yes   Patient/family provided with [place] Work Department's list of facilities offering this level of care within the geographic area requested by the patient (or if unable, by the patient's family).  Yes   Patient/family informed of their freedom to choose among providers that offer the needed level of care, that participate in Medicare, Medicaid or managed care program needed by the patient, have an available bed and are willing to accept the patient.  Yes   Patient/family informed of [facility]'s ownership interest in [facility] and [facility], as well as of the fact that they are under no obligation to receive care at these facilities.  PASRR submitted to EDS on       PASRR number received on       Existing PASRR number confirmed on 07/04/16     FL2 transmitted to all facilities in geographic area requested by pt/family on 07/04/16     FL2 transmitted to all facilities within larger geographic area on       Patient informed that his/her managed care company has contracts with or will negotiate with certain facilities, including the following:        Yes   Patient/family informed of bed offers received.  Patient chooses bed at  [facility] )     Physician recommends and patient chooses bed at      Patient to be transferred to  [facility] on  07-06-16 (Updated Evette Cristal, MSW, 07-06-16).  Patient to be transferred to facility by   [facility] (Updated Evette Cristal, MSW, 07-06-16)    Patient family notified on   07-06-16 of transfer (Updated Evette Cristal, MSW, 07-06-16)  .  Name of family member notified:    Daughter [name] [phone] (Updated Evette Cristal, MSW, 07-06-16)    PHYSICIAN       Additional Comment:    _______________________________________________ Sample, Veronia Beets, LCSW 07/04/2016, 1:00 PM

## 2016-07-05 MED ORDER — TRAMADOL HCL 50 MG PO TABS: 100.0000 mg | ORAL_TABLET | Freq: Four times a day (QID) | ORAL | 1 refills | Status: DC

## 2016-07-05 MED ORDER — ENOXAPARIN SODIUM 30 MG/0.3ML ~~LOC~~ SOLN: 30.0000 mg | Freq: Two times a day (BID) | SUBCUTANEOUS | 0 refills | Status: DC

## 2016-07-05 MED ORDER — HYDROCODONE-ACETAMINOPHEN 10-325 MG PO TABS: 1.0000 | ORAL_TABLET | ORAL | 0 refills | Status: DC | PRN

## 2016-07-05 NOTE — Progress Notes (Signed)
Pt a bit lethargic this afternoon but easy to awaken. Due to her afternoon lethargy pt agreed to not take her 1800 standing tramadol and also states she is not having any pain. Patients daughter would like to be called when pt is picked up for discharge tomorrow her phone number is written on the board. Charge nurse made aware of daughters wishes.

## 2016-07-05 NOTE — Progress Notes (Signed)
Pt a little on the lethargic side tonight. Agreeable to holding sleeping pill tonight.

## 2016-07-05 NOTE — Discharge Instructions (Signed)

## 2016-07-05 NOTE — Progress Notes (Signed)
Physical Therapy Treatment Patient Details Name: [name] Douglas MRN: [id number] DOB: 08-11-1941 Today's Date: 07/05/2016    History of Present Illness admitted for acute hospitalization status post R TKR, WBAT, 07/03/16.    PT Comments    Pt in chair requesting to use bathroom.  Ambulated in/out of bathroom with walker and min a x 1.  Min a for sit to supine.  Pt with confusion noted.  She tried to turn and walk through toilet and needed to be re-directed.  On return to room she tried to walk through mirror.  Upon return to supine she fell promptly to sleep. Discussed with Stage manager.   Follow Up Recommendations  SNF     Equipment Recommendations       Recommendations for Other Services       Precautions / Restrictions Precautions Precautions: Fall Restrictions Weight Bearing Restrictions: Yes RLE Weight Bearing: Weight bearing as tolerated    Mobility  Bed Mobility Overal bed mobility: Needs Assistance Bed Mobility: Sit to Supine     Supine to sit: Min assist        Transfers Overall transfer level: Needs assistance Equipment used: Rolling walker (2 wheeled) Transfers: Sit to/from Stand Sit to Stand: Min assist            Ambulation/Gait Ambulation/Gait assistance: Min assist Ambulation Distance (Feet): 25 Feet (x 2) Assistive device: Rolling walker (2 wheeled) Gait Pattern/deviations: Step-to pattern   Gait velocity interpretation: <1.8 ft/sec, indicative of risk for recurrent falls     Stairs            Wheelchair Mobility    Modified Rankin (Stroke Patients Only)       Balance Overall balance assessment: Needs assistance Sitting-balance support: Feet supported Sitting balance-Leahy Scale: Good     Standing balance support: Bilateral upper extremity supported Standing balance-Leahy Scale: Fair                      Cognition Arousal/Alertness: Lethargic Behavior During Therapy: WFL for tasks assessed/performed  (confusion noted) Overall Cognitive Status: Within Functional Limits for tasks assessed                      Exercises Total Joint Exercises Goniometric ROM: 6-82 degrees, act assist ROM Other Exercises Other Exercises: Supine R LE SLR x5, sup-cuing for full effort (tends to cease due to pain); able to complete with fair/good quad activation, minimal lab Other Exercises: Seated LE therex, 1x10: ankle pumps, LAQs, marching with emphasis on isolated control/activation of R knee.    General Comments        Pertinent Vitals/Pain Pain Assessment: 0-10 Pain Score: 4  Pain Location: R knee Pain Descriptors / Indicators: Aching;Operative site guarding Pain Intervention(s): Limited activity within patient's tolerance;Ice applied    Home Living                      Prior Function            PT Goals (current goals can now be found in the care plan section) Acute Rehab PT Goals Patient Stated Goal: to do what I need to do to get better PT Goal Formulation: With patient/family Time For Goal Achievement: 07/17/16 Potential to Achieve Goals: Good Progress towards PT goals: Progressing toward goals    Frequency    BID      PT Plan Current plan remains appropriate    Co-evaluation  End of Session Equipment Utilized During Treatment: Gait belt Activity Tolerance: Patient limited by fatigue;Patient limited by lethargy Patient left: in bed;with call bell/phone within reach;with bed alarm set;with SCD's reapplied     Time: 1340-1400 PT Time Calculation (min) (ACUTE ONLY): 20 min  Charges:  $Gait Training: 8-22 mins $Therapeutic Exercise: 8-22 mins                    G Codes:      Chesley Noon 2016/07/27, 2:06 PM

## 2016-07-05 NOTE — Progress Notes (Signed)
  Subjective: 2 Days Post-Op Procedure(s) (LRB): TOTAL KNEE ARTHROPLASTY (Right) Patient reports pain as mild.   Patient seen in rounds with Dr. Rudene Christians. Patient is well, and has had no acute complaints or problems Plan is to go Rehab after hospital stay. The patient has accepted a bed offer at [facility]. Negative for chest pain and shortness of breath Fever: no Gastrointestinal: Negative for nausea and vomiting  Objective: Vital signs in last 24 hours: Temp:  [97.7 F (36.5 C)-98.6 F (37 C)] 98 F (36.7 C) (11/16 0448) Pulse Rate:  [64-76] 71 (11/16 0448) Resp:  [16-18] 16 (11/16 0448) BP: (115-169)/(41-52) 115/52 (11/16 0448) SpO2:  [88 %-100 %] 95 % (11/16 0448)  Intake/Output from previous day:  Intake/Output Summary (Last 24 hours) at 07/05/16 0616 Last data filed at 07/04/16 1634  Gross per 24 hour  Intake              625 ml  Output              275 ml  Net              350 ml    Intake/Output this shift: No intake/output data recorded.  Labs:  Recent Labs  07/03/16 0625 07/04/16 0407  HGB 11.9* 10.9*    Recent Labs  07/03/16 0625 07/04/16 0407  WBC 8.8 9.7  RBC 4.15 3.77*  HCT 36.1 32.7*  PLT 252 223    Recent Labs  07/03/16 0625 07/04/16 0407  NA  --  137  K  --  4.5  CL  --  100*  CO2  --  31  BUN  --  9  CREATININE 0.72 0.80  GLUCOSE  --  125*  CALCIUM  --  8.5*   No results for input(s): LABPT, INR in the last 72 hours.   EXAM General - Patient is Alert and Oriented Extremity - Sensation intact distally Dorsiflexion/Plantar flexion intact No cellulitis present Compartment soft Dressing/Incision - clean, dry, no drainage. A new honeycomb dressing was applied. Motor Function - intact, moving foot and toes well on exam.  The patient ambulated 15 feet with physical therapy  Past Medical History:  Diagnosis Date  . Anxiety   . Arthritis    "shoulders" (08/20/2014)  . Asthma   . Chronic lower back pain   . Complication of  anesthesia    "agitated & restless after knee replacement in 2008"  . COPD (chronic obstructive pulmonary disease) ([place])   . DDD (degenerative disc disease), lumbar   . Depression   . Diverticulosis   . GERD (gastroesophageal reflux disease)   . High cholesterol   . Hypertension   . Hypothyroidism   . On home oxygen therapy    "2L at night" (08/20/2014)  . Shortness of breath dyspnea    with exertion    Assessment/Plan: 2 Days Post-Op Procedure(s) (LRB): TOTAL KNEE ARTHROPLASTY (Right) Active Problems:   Primary localized osteoarthritis of right knee  Estimated body mass index is 34.86 kg/m as calculated from the following:   Height as of this encounter: 5\' 6"  (1.676 m).   Weight as of this encounter: 98 kg (216 lb). Advance diet Up with therapy D/C IV fluids  Continue physical therapy today. Plan to discharge to skilled nursing Friday.  DVT Prophylaxis - Lovenox, Foot Pumps and TED hose Weight-Bearing as tolerated to right leg  Reche Dixon, PA-C Orthopaedic Surgery 07/05/2016, 6:16 AM

## 2016-07-05 NOTE — Progress Notes (Signed)
This was a follow-up visit with the Pt. [place] visited the Pt to see how the Pt was doing but when he arrived, he found Pt sleeping. CH was not able to talk with Pt, but have plans to visit the Pt again soon.     07/05/16 1600  Clinical Encounter Type  Visited With Patient  Visit Type Follow-up;Spiritual support  Spiritual Encounters  Spiritual Needs Prayer;Other (Comment)

## 2016-07-05 NOTE — Progress Notes (Signed)
Physical Therapy Treatment Patient Details Name: [name] Douglas MRN: [id number] DOB: Dec 01, 1940 Today's Date: 07/05/2016    History of Present Illness admitted for acute hospitalization status post R TKR, WBAT, 07/03/16.    PT Comments    Patient continues to show consistent progress related overall functional performance.  Able to negotiate R LE over edge of bed without assist this date; tolerating gait distance up to 50' with min assist.  Constant encouragement for all activities, but demonstrates good effort throughout. Pain control appears much improved with new medication and new schedule; will continue to coordinate with therapy sessions to maximize effect (daughter requesting that patient get zofran with pain meds prior therapy session as well).  Follow Up Recommendations  SNF     Equipment Recommendations       Recommendations for Other Services       Precautions / Restrictions Precautions Precautions: Fall Restrictions Weight Bearing Restrictions: Yes RLE Weight Bearing: Weight bearing as tolerated    Mobility  Bed Mobility Overal bed mobility: Needs Assistance Bed Mobility: Supine to Sit     Supine to sit: Supervision     General bed mobility comments: able to negotiate R LE over edge of bed without assist from therapist this date; mod use of bedrails  Transfers Overall transfer level: Needs assistance Equipment used: Rolling walker (2 wheeled) Transfers: Sit to/from Stand Sit to Stand: Min assist         General transfer comment: R LE anterior to BOS for pain control  Ambulation/Gait Ambulation/Gait assistance: Min assist;Mod assist Ambulation Distance (Feet): 50 Feet Assistive device: Rolling walker (2 wheeled)     Gait velocity interpretation: <1.8 ft/sec, indicative of risk for recurrent falls General Gait Details: step to gait pattern with forward trunk flexion, very choppy gait performance with multiple standing rest breaks.  Cuing for  postural extension, walker position, increased cadence and continuous gait pattern.  Fair weight acceptance/control in L LE.  L lateral LOB x1 (during turn) requiring mod assist from therapist for correction   Stairs            Wheelchair Mobility    Modified Rankin (Stroke Patients Only)       Balance Overall balance assessment: Needs assistance Sitting-balance support: No upper extremity supported;Feet supported Sitting balance-Leahy Scale: Good     Standing balance support: Bilateral upper extremity supported Standing balance-Leahy Scale: Fair                      Cognition Arousal/Alertness: Awake/alert Behavior During Therapy: WFL for tasks assessed/performed Overall Cognitive Status: Within Functional Limits for tasks assessed                      Exercises Total Joint Exercises Goniometric ROM: 6-82 degrees, act assist ROM Other Exercises Other Exercises: Supine R LE SLR x5, sup-cuing for full effort (tends to cease due to pain); able to complete with fair/good quad activation, minimal lab Other Exercises: Seated LE therex, 1x10: ankle pumps, LAQs, marching with emphasis on isolated control/activation of R knee.    General Comments        Pertinent Vitals/Pain Pain Score: 6  Pain Location: R LE Pain Descriptors / Indicators: Aching;Guarding;Grimacing Pain Intervention(s): Limited activity within patient's tolerance;Monitored during session;Premedicated before session;Repositioned    Home Living                      Prior Function  PT Goals (current goals can now be found in the care plan section) Acute Rehab PT Goals Patient Stated Goal: to do what I need to do to get better PT Goal Formulation: With patient/family Time For Goal Achievement: 07/17/16 Potential to Achieve Goals: Good Progress towards PT goals: Progressing toward goals    Frequency    BID      PT Plan Current plan remains appropriate     Co-evaluation             End of Session Equipment Utilized During Treatment: Gait belt Activity Tolerance: Patient tolerated treatment well Patient left: in chair;with call bell/phone within reach;with chair alarm set     Time: [id number] PT Time Calculation (min) (ACUTE ONLY): 25 min  Charges:  $Gait Training: 8-22 mins $Therapeutic Exercise: 8-22 mins                    G Codes:       Isay Perleberg H. Owens Shark, PT, DPT, NCS 07/05/16, 10:13 AM [phone]

## 2016-07-05 NOTE — Progress Notes (Signed)
Plan is for patient to D/C to [facility] tomorrow pending medical clearance. [facility] admissions coordinator at [facility] is aware of above. Clinical Social Worker (CSW) will continue to follow and assist as needed.   [employer], LCSW [phone]

## 2016-07-06 DIAGNOSIS — M25561 Pain in right knee: Secondary | ICD-10-CM | POA: Diagnosis not present

## 2016-07-06 DIAGNOSIS — F17210 Nicotine dependence, cigarettes, uncomplicated: Secondary | ICD-10-CM | POA: Diagnosis present

## 2016-07-06 DIAGNOSIS — E669 Obesity, unspecified: Secondary | ICD-10-CM | POA: Diagnosis present

## 2016-07-06 DIAGNOSIS — E7800 Pure hypercholesterolemia, unspecified: Secondary | ICD-10-CM | POA: Diagnosis present

## 2016-07-06 DIAGNOSIS — M19011 Primary osteoarthritis, right shoulder: Secondary | ICD-10-CM | POA: Diagnosis not present

## 2016-07-06 DIAGNOSIS — Z9981 Dependence on supplemental oxygen: Secondary | ICD-10-CM | POA: Diagnosis not present

## 2016-07-06 DIAGNOSIS — F419 Anxiety disorder, unspecified: Secondary | ICD-10-CM | POA: Diagnosis not present

## 2016-07-06 DIAGNOSIS — M109 Gout, unspecified: Secondary | ICD-10-CM | POA: Diagnosis not present

## 2016-07-06 DIAGNOSIS — E039 Hypothyroidism, unspecified: Secondary | ICD-10-CM | POA: Diagnosis present

## 2016-07-06 DIAGNOSIS — M7989 Other specified soft tissue disorders: Secondary | ICD-10-CM | POA: Diagnosis not present

## 2016-07-06 DIAGNOSIS — Z471 Aftercare following joint replacement surgery: Secondary | ICD-10-CM | POA: Diagnosis not present

## 2016-07-06 DIAGNOSIS — Z23 Encounter for immunization: Secondary | ICD-10-CM | POA: Diagnosis not present

## 2016-07-06 DIAGNOSIS — J449 Chronic obstructive pulmonary disease, unspecified: Secondary | ICD-10-CM | POA: Diagnosis not present

## 2016-07-06 DIAGNOSIS — I10 Essential (primary) hypertension: Secondary | ICD-10-CM | POA: Diagnosis not present

## 2016-07-06 DIAGNOSIS — Z781 Physical restraint status: Secondary | ICD-10-CM | POA: Diagnosis not present

## 2016-07-06 DIAGNOSIS — Z6834 Body mass index (BMI) 34.0-34.9, adult: Secondary | ICD-10-CM | POA: Diagnosis not present

## 2016-07-06 DIAGNOSIS — E785 Hyperlipidemia, unspecified: Secondary | ICD-10-CM | POA: Diagnosis not present

## 2016-07-06 DIAGNOSIS — K219 Gastro-esophageal reflux disease without esophagitis: Secondary | ICD-10-CM | POA: Diagnosis not present

## 2016-07-06 DIAGNOSIS — M19012 Primary osteoarthritis, left shoulder: Secondary | ICD-10-CM | POA: Diagnosis not present

## 2016-07-06 DIAGNOSIS — L03113 Cellulitis of right upper limb: Secondary | ICD-10-CM | POA: Diagnosis not present

## 2016-07-06 DIAGNOSIS — M545 Low back pain: Secondary | ICD-10-CM | POA: Diagnosis not present

## 2016-07-06 DIAGNOSIS — N390 Urinary tract infection, site not specified: Secondary | ICD-10-CM | POA: Diagnosis present

## 2016-07-06 DIAGNOSIS — L03115 Cellulitis of right lower limb: Secondary | ICD-10-CM | POA: Diagnosis not present

## 2016-07-06 DIAGNOSIS — R7881 Bacteremia: Secondary | ICD-10-CM | POA: Diagnosis present

## 2016-07-06 DIAGNOSIS — Y831 Surgical operation with implant of artificial internal device as the cause of abnormal reaction of the patient, or of later complication, without mention of misadventure at the time of the procedure: Secondary | ICD-10-CM | POA: Diagnosis present

## 2016-07-06 DIAGNOSIS — F329 Major depressive disorder, single episode, unspecified: Secondary | ICD-10-CM | POA: Diagnosis not present

## 2016-07-06 DIAGNOSIS — M1711 Unilateral primary osteoarthritis, right knee: Secondary | ICD-10-CM | POA: Diagnosis not present

## 2016-07-06 DIAGNOSIS — T8453XA Infection and inflammatory reaction due to internal right knee prosthesis, initial encounter: Secondary | ICD-10-CM | POA: Diagnosis present

## 2016-07-06 DIAGNOSIS — M159 Polyosteoarthritis, unspecified: Secondary | ICD-10-CM | POA: Diagnosis not present

## 2016-07-06 DIAGNOSIS — Z96651 Presence of right artificial knee joint: Secondary | ICD-10-CM | POA: Diagnosis not present

## 2016-07-06 DIAGNOSIS — M5136 Other intervertebral disc degeneration, lumbar region: Secondary | ICD-10-CM | POA: Diagnosis present

## 2016-07-06 DIAGNOSIS — B9561 Methicillin susceptible Staphylococcus aureus infection as the cause of diseases classified elsewhere: Secondary | ICD-10-CM | POA: Diagnosis present

## 2016-07-06 DIAGNOSIS — M9689 Other intraoperative and postprocedural complications and disorders of the musculoskeletal system: Secondary | ICD-10-CM | POA: Diagnosis not present

## 2016-07-06 DIAGNOSIS — Z96653 Presence of artificial knee joint, bilateral: Secondary | ICD-10-CM | POA: Diagnosis present

## 2016-07-06 MED ORDER — FLEET ENEMA 7-19 GM/118ML RE ENEM
1.0000 | ENEMA | Freq: Once | RECTAL | Status: AC
  Administered 2016-07-06: 1 via RECTAL

## 2016-07-06 NOTE — Clinical Social Work Note (Signed)
Patient to be d/c'ed today to [facility].  Patient and family agreeable to plans will transport via ems RN to call report to [phone].  Evette Cristal, MSW Mon-Fri 8a-4:30p [phone]

## 2016-07-06 NOTE — Progress Notes (Addendum)
Shift assessment completed at 0720. Pt  Is awake, alert, oriented to self only, was reoriented to hospital and reoriented to the fact that her knee surgery was completed 3 days ago. Pt is taken off o2, resulting room air sat was 94%. Pt stated she is supposed to wear the O2 at home at night but has not been compliant with this. Lungs are clear bilat, Hr is regular, abdomen is soft, bs  Are hypoactive, pt denied nausea. R knee has dressing and polar care in place, no edema noted below knee, ppp, pt is able to flex her feet, moves toes. Pt assissted oob to commode with two staff to void and returned to bed. Pt has received suppository for bm. Hob is elevated, call bell in reach. Pt has had PIV removedfrom L arm with catheter intact by student nurse.

## 2016-07-06 NOTE — Care Management Important Message (Signed)
Important Message  Patient Details  Name: [name] Douglas MRN: [id number] Date of Birth: 06-Apr-1941   Medicare Important Message Given:  Yes    Jolly Mango, RN 07/06/2016, 9:14 AM

## 2016-07-06 NOTE — Plan of Care (Signed)
Problem: Activity: Goal: Ability to avoid complications of mobility impairment will improve Pt is dc'd to [facility] via EMS at this time. PT alert and oriented x2, pt has had several small stools, pt's daughter was called to notifiy her of transfer. This Probation officer gave report to [facility].

## 2016-07-06 NOTE — Progress Notes (Signed)
Pt received a fleets enema and had resulting small firm stool. Pt returned to bed, knows where she is now, understands that she may be dc'd today.

## 2016-07-06 NOTE — Discharge Summary (Signed)
Physician Discharge Summary  Subjective: 3 Days Post-Op Procedure(s) (LRB): TOTAL KNEE ARTHROPLASTY (Right) Patient reports pain as mild.   Patient seen in rounds with Dr. Rudene Christians. Patient is well, and has had no acute complaints or problems Patient is ready to go To rehabilitation at [facility]  Physician Discharge Summary  Patient ID: [name] Douglas MRN: [id number] DOB/AGE: May 12, 1941 75 y.o.  Admit date: 07/03/2016 Discharge date: 07/06/2016  Admission Diagnoses:  Discharge Diagnoses:  Active Problems:   Primary localized osteoarthritis of right knee   Discharged Condition: fair  Hospital Course: The patient is postop day 3 from a right total knee replacement. She has progressed slowly, but is doing well. She is ambulating 25 feet with physical therapy and is working on transfers. Her pain level has become more under control. She is still working on having a bowel movement before discharge.  Treatments: surgery:  TOTAL KNEE ARTHROPLASTY (Right)  SURGEON: Laurene Footman, MD  ASSISTANTS: Rachelle Hora, PA-C  ANESTHESIA:   spinal  EBL:  Total I/O In: 1400 [I.V.:1400] Out: 200 [Urine:100; Blood:100]  BLOOD ADMINISTERED:none  DRAINS: none   LOCAL MEDICATIONS USED:  MARCAINE    and OTHER morphine, Exparel  SPECIMEN:  No Specimen  DISPOSITION OF SPECIMEN:  N/A  COUNTS:  YES  TOURNIQUET:   24 minutes at 300 mmHg  IMPLANTS: Medacta GMK sphere, 3+ femur, 3 tibia was short stem and 11 mm insert, to patella, all components cemented  Discharge Exam: Blood pressure (!) 115/41, pulse 82, temperature 98.2 F (36.8 C), temperature source Oral, resp. rate 16, height 5\' 6"  (1.676 m), weight 98 kg (216 lb), SpO2 91 %.   Disposition: 01-Home or Self Care     Medication List    TAKE these medications   albuterol 108 (90 Base) MCG/ACT inhaler Commonly known as:  PROVENTIL HFA;VENTOLIN HFA Inhale 2 puffs into the lungs every 6 (six) hours as needed for  wheezing or shortness of breath. What changed:  Another medication with the same name was changed. Make sure you understand how and when to take each.   albuterol (2.5 MG/3ML) 0.083% nebulizer solution Commonly known as:  PROVENTIL Take 3 mLs (2.5 mg total) by nebulization 2 (two) times daily. What changed:  when to take this  reasons to take this   atorvastatin 10 MG tablet Commonly known as:  LIPITOR Take 10 mg by mouth every evening.   calcium-vitamin D 500-200 MG-UNIT tablet Take 1 tablet by mouth daily.   enoxaparin 30 MG/0.3ML injection Commonly known as:  LOVENOX Inject 0.3 mLs (30 mg total) into the skin every 12 (twelve) hours.   gabapentin 100 MG capsule Commonly known as:  NEURONTIN Take 100 mg by mouth 3 (three) times daily.   HYDROcodone-acetaminophen 10-325 MG tablet Commonly known as:  NORCO Take 1-2 tablets by mouth every 4 (four) hours as needed for moderate pain.   levothyroxine 88 MCG tablet Commonly known as:  SYNTHROID, LEVOTHROID Take 88 mcg by mouth daily before breakfast.   metoprolol 50 MG tablet Commonly known as:  LOPRESSOR Take 50 mg by mouth every evening.   tiotropium 18 MCG inhalation capsule Commonly known as:  SPIRIVA HANDIHALER Place 1 capsule (18 mcg total) into inhaler and inhale daily.   traMADol 50 MG tablet Commonly known as:  ULTRAM Take 2 tablets (100 mg total) by mouth every 6 (six) hours. What changed:  how much to take  when to take this  reasons to take this   venlafaxine XR  75 MG 24 hr capsule Commonly known as:  EFFEXOR-XR Take 3 capsules (225 mg total) by mouth daily with breakfast.            Durable Medical Equipment        Start     Ordered   07/03/16 1049  DME Walker rolling  Once     07/03/16 1048   07/03/16 1049  DME 3 n 1  Once     07/03/16 1048   07/03/16 1049  DME Bedside commode  Once     07/03/16 1048      Contact information for follow-up providers    MENZ,MICHAEL, MD On  08/01/2016.   Specialty:  Orthopedic Surgery Why:  [id number] Contact information: [place] [address] [phone]            Contact information for after-discharge care    Destination    [facility] Follow up.   Specialty:  [place] information: [place] [phone]                  Signed: Prescott Parma, [place] 07/06/2016, 6:14 AM   Objective: Vital signs in last 24 hours: Temp:  [97.5 F (36.4 C)-98.2 F (36.8 C)] 98.2 F (36.8 C) (11/17 0435) Pulse Rate:  [66-82] 82 (11/17 0435) Resp:  [16] 16 (11/17 0435) BP: (115-118)/(40-58) 115/41 (11/17 0435) SpO2:  [91 %-95 %] 91 % (11/17 0435)  Intake/Output from previous day:  Intake/Output Summary (Last 24 hours) at 07/06/16 0614 Last data filed at 07/05/16 2137  Gross per 24 hour  Intake              120 ml  Output              400 ml  Net             -280 ml    Intake/Output this shift: Total I/O In: 120 [P.O.:120] Out: 400 [Urine:400]  Labs:  Recent Labs  07/03/16 0625 07/04/16 0407  HGB 11.9* 10.9*    Recent Labs  07/03/16 0625 07/04/16 0407  WBC 8.8 9.7  RBC 4.15 3.77*  HCT 36.1 32.7*  PLT 252 223    Recent Labs  07/03/16 0625 07/04/16 0407  NA  --  137  K  --  4.5  CL  --  100*  CO2  --  31  BUN  --  9  CREATININE 0.72 0.80  GLUCOSE  --  125*  CALCIUM  --  8.5*   No results for input(s): LABPT, INR in the last 72 hours.  EXAM: General - Patient is Alert and Oriented Extremity - Intact pulses distally No cellulitis present Compartment soft Incision - clean, dry, blood tinged drainage Motor Function -  the patient is able to plantarflex and dorsiflex her feet. She has ambulated 25 feet with physical therapy.  Assessment/Plan: 3 Days Post-Op Procedure(s) (LRB): TOTAL KNEE ARTHROPLASTY (Right) Procedure(s) (LRB): TOTAL KNEE  ARTHROPLASTY (Right) Past Medical History:  Diagnosis Date  . Anxiety   . Arthritis    "shoulders" (08/20/2014)  . Asthma   . Chronic lower back pain   . Complication of anesthesia    "agitated & restless after knee replacement in 2008"  . COPD (chronic obstructive pulmonary disease) ([place])   . DDD (degenerative disc disease), lumbar   . Depression   . Diverticulosis   . GERD (gastroesophageal reflux disease)   .  High cholesterol   . Hypertension   . Hypothyroidism   . On home oxygen therapy    "2L at night" (08/20/2014)  . Shortness of breath dyspnea    with exertion   Active Problems:   Primary localized osteoarthritis of right knee  Estimated body mass index is 34.86 kg/m as calculated from the following:   Height as of this encounter: 5\' 6"  (1.676 m).   Weight as of this encounter: 98 kg (216 lb). Advance diet Up with therapy Discharge to SNF Diet - Regular diet Follow up - in 2 weeks Activity - WBAT Disposition - Rehab Condition Upon Discharge - Stable DVT Prophylaxis - Lovenox and TED hose  Reche Dixon, PA-C Orthopaedic Surgery 07/06/2016, 6:14 AM

## 2016-07-06 NOTE — Progress Notes (Signed)
  Subjective: 3 Days Post-Op Procedure(s) (LRB): TOTAL KNEE ARTHROPLASTY (Right) Patient reports pain as mild.   Patient seen in rounds with Dr. Rudene Christians. Patient is well, and has had no acute complaints or problems Plan is to go Rehab after hospital stay. The patient has accepted a bed offer at [facility]. Negative for chest pain and shortness of breath Fever: no Gastrointestinal: Negative for nausea and vomiting  Objective: Vital signs in last 24 hours: Temp:  [97.5 F (36.4 C)-98.2 F (36.8 C)] 98.2 F (36.8 C) (11/17 0435) Pulse Rate:  [66-82] 82 (11/17 0435) Resp:  [16] 16 (11/17 0435) BP: (115-118)/(40-58) 115/41 (11/17 0435) SpO2:  [91 %-95 %] 91 % (11/17 0435)  Intake/Output from previous day:  Intake/Output Summary (Last 24 hours) at 07/06/16 0612 Last data filed at 07/05/16 2137  Gross per 24 hour  Intake              120 ml  Output              400 ml  Net             -280 ml    Intake/Output this shift: Total I/O In: 120 [P.O.:120] Out: 400 [Urine:400]  Labs:  Recent Labs  07/03/16 0625 07/04/16 0407  HGB 11.9* 10.9*    Recent Labs  07/03/16 0625 07/04/16 0407  WBC 8.8 9.7  RBC 4.15 3.77*  HCT 36.1 32.7*  PLT 252 223    Recent Labs  07/03/16 0625 07/04/16 0407  NA  --  137  K  --  4.5  CL  --  100*  CO2  --  31  BUN  --  9  CREATININE 0.72 0.80  GLUCOSE  --  125*  CALCIUM  --  8.5*   No results for input(s): LABPT, INR in the last 72 hours.   EXAM General - Patient is Alert and Oriented Extremity - Sensation intact distally Dorsiflexion/Plantar flexion intact No cellulitis present Compartment soft Dressing/Incision - clean, dry, Scant drainage.  Motor Function - intact, moving foot and toes well on exam.  The patient ambulated 25 feet with physical therapy  Past Medical History:  Diagnosis Date  . Anxiety   . Arthritis    "shoulders" (08/20/2014)  . Asthma   . Chronic lower back pain   . Complication of anesthesia    "agitated & restless after knee replacement in 2008"  . COPD (chronic obstructive pulmonary disease) ([place])   . DDD (degenerative disc disease), lumbar   . Depression   . Diverticulosis   . GERD (gastroesophageal reflux disease)   . High cholesterol   . Hypertension   . Hypothyroidism   . On home oxygen therapy    "2L at night" (08/20/2014)  . Shortness of breath dyspnea    with exertion    Assessment/Plan: 3 Days Post-Op Procedure(s) (LRB): TOTAL KNEE ARTHROPLASTY (Right) Active Problems:   Primary localized osteoarthritis of right knee  Estimated body mass index is 34.86 kg/m as calculated from the following:   Height as of this encounter: 5\' 6"  (1.676 m).   Weight as of this encounter: 98 kg (216 lb). Advance diet Up with therapy D/C IV fluids  The patient needs a bowel movement.  Continue physical therapy today. Plan to discharge to skilled nursing Today .  DVT Prophylaxis - Lovenox, Foot Pumps and TED hose Weight-Bearing as tolerated to right leg  Reche Dixon, PA-C Orthopaedic Surgery 07/06/2016, 6:12 AM

## 2016-07-06 NOTE — Progress Notes (Signed)
Physical Therapy Treatment Patient Details Name: [name] Douglas MRN: [id number] DOB: Jul 24, 1941 Today's Date: 07/06/2016    History of Present Illness admitted for acute hospitalization status post R TKR, WBAT, 07/03/16.    PT Comments    Pt progressing with PT this date, able to increase gait distance and requiring less assist with bed mobility and transfers.  Pt easily fatigued after 60' of gait training and demonstrated decreased safety with RW, needing verbal cues to keep walker closer to front of body.  Pt returned to bed, supine and fell asleep immediately.   Follow Up Recommendations  SNF     Equipment Recommendations       Recommendations for Other Services       Precautions / Restrictions Precautions Precautions: Fall Restrictions Weight Bearing Restrictions: Yes RLE Weight Bearing: Weight bearing as tolerated    Mobility  Bed Mobility   Bed Mobility: Supine to Sit;Sit to Supine     Supine to sit: Min guard Sit to supine: Min guard   General bed mobility comments: Able to move R LE to EOB, CGA for support; HOB elevated exiting to L side of bed, using bed rails  Transfers   Equipment used: Rolling walker (2 wheeled) Transfers: Sit to/from Stand Sit to Stand: Min guard         General transfer comment: Good sequencing with rising/lowering from bed, able to stand on first attempt  Ambulation/Gait Ambulation/Gait assistance: Min guard Ambulation Distance (Feet): 60 Feet Assistive device: Rolling walker (2 wheeled)       General Gait Details: Step to gait pattern with RW, verbal cues to keep RW closer to body; leaning onto elbows at end of session due to fatigue   Stairs            Wheelchair Mobility    Modified Rankin (Stroke Patients Only)       Balance                                    Cognition Arousal/Alertness: Lethargic Behavior During Therapy: WFL for tasks assessed/performed Overall Cognitive Status:  Within Functional Limits for tasks assessed                      Exercises Total Joint Exercises Goniometric ROM: 5-75 Other Exercises Other Exercises: Supine R LE therex SLR x 10 reps with CGA; B LE therex: AP, QS, HS; seated LAQ x 10 reps    General Comments        Pertinent Vitals/Pain Pain Location: R knee, with mobiltiy Pain Intervention(s): Limited activity within patient's tolerance    Home Living                      Prior Function            PT Goals (current goals can now be found in the care plan section) Acute Rehab PT Goals Patient Stated Goal: to do what I need to do to get better PT Goal Formulation: With patient/family Time For Goal Achievement: 07/17/16 Potential to Achieve Goals: Good Progress towards PT goals: Progressing toward goals    Frequency    BID      PT Plan Current plan remains appropriate    Co-evaluation             End of Session Equipment Utilized During Treatment: Gait belt Activity Tolerance: Patient limited by lethargy  Patient left: in bed;with call bell/phone within reach;with bed alarm set;with SCD's reapplied     Time: [id number] PT Time Calculation (min) (ACUTE ONLY): 29 min  Charges:  $Gait Training: 8-22 mins $Therapeutic Exercise: 8-22 mins                    G Codes:      Asaf Elmquist A Smaran Gaus, PT July 14, 2016, 11:40 AM

## 2016-07-09 DIAGNOSIS — M109 Gout, unspecified: Secondary | ICD-10-CM | POA: Diagnosis not present

## 2016-07-09 DIAGNOSIS — I10 Essential (primary) hypertension: Secondary | ICD-10-CM | POA: Diagnosis not present

## 2016-07-09 DIAGNOSIS — M159 Polyosteoarthritis, unspecified: Secondary | ICD-10-CM | POA: Diagnosis not present

## 2016-07-09 DIAGNOSIS — F419 Anxiety disorder, unspecified: Secondary | ICD-10-CM | POA: Diagnosis not present

## 2016-07-09 DIAGNOSIS — J449 Chronic obstructive pulmonary disease, unspecified: Secondary | ICD-10-CM | POA: Diagnosis not present

## 2016-07-17 ENCOUNTER — Emergency Department: Payer: Medicare Other

## 2016-07-17 ENCOUNTER — Encounter: Payer: Self-pay | Admitting: Emergency Medicine

## 2016-07-17 ENCOUNTER — Other Ambulatory Visit: Payer: Self-pay | Admitting: *Deleted

## 2016-07-17 ENCOUNTER — Inpatient Hospital Stay
Admission: EM | Admit: 2016-07-17 | Discharge: 2016-07-22 | DRG: 560 | Disposition: A | Discharge status: Skilled Nursing Facility | Payer: Medicare Other | Attending: Orthopedic Surgery | Admitting: Orthopedic Surgery

## 2016-07-17 DIAGNOSIS — I10 Essential (primary) hypertension: Secondary | ICD-10-CM | POA: Diagnosis present

## 2016-07-17 DIAGNOSIS — R509 Fever, unspecified: Secondary | ICD-10-CM | POA: Diagnosis not present

## 2016-07-17 DIAGNOSIS — Z6834 Body mass index (BMI) 34.0-34.9, adult: Secondary | ICD-10-CM | POA: Diagnosis not present

## 2016-07-17 DIAGNOSIS — E039 Hypothyroidism, unspecified: Secondary | ICD-10-CM | POA: Diagnosis present

## 2016-07-17 DIAGNOSIS — Z452 Encounter for adjustment and management of vascular access device: Secondary | ICD-10-CM | POA: Diagnosis not present

## 2016-07-17 DIAGNOSIS — E669 Obesity, unspecified: Secondary | ICD-10-CM | POA: Diagnosis present

## 2016-07-17 DIAGNOSIS — L03115 Cellulitis of right lower limb: Secondary | ICD-10-CM | POA: Diagnosis present

## 2016-07-17 DIAGNOSIS — T8453XA Infection and inflammatory reaction due to internal right knee prosthesis, initial encounter: Principal | ICD-10-CM | POA: Diagnosis present

## 2016-07-17 DIAGNOSIS — Y831 Surgical operation with implant of artificial internal device as the cause of abnormal reaction of the patient, or of later complication, without mention of misadventure at the time of the procedure: Secondary | ICD-10-CM | POA: Diagnosis present

## 2016-07-17 DIAGNOSIS — F17210 Nicotine dependence, cigarettes, uncomplicated: Secondary | ICD-10-CM | POA: Diagnosis present

## 2016-07-17 DIAGNOSIS — E785 Hyperlipidemia, unspecified: Secondary | ICD-10-CM | POA: Diagnosis not present

## 2016-07-17 DIAGNOSIS — M25561 Pain in right knee: Secondary | ICD-10-CM | POA: Diagnosis not present

## 2016-07-17 DIAGNOSIS — E7800 Pure hypercholesterolemia, unspecified: Secondary | ICD-10-CM | POA: Diagnosis present

## 2016-07-17 DIAGNOSIS — Z4789 Encounter for other orthopedic aftercare: Secondary | ICD-10-CM | POA: Diagnosis not present

## 2016-07-17 DIAGNOSIS — R52 Pain, unspecified: Secondary | ICD-10-CM | POA: Diagnosis not present

## 2016-07-17 DIAGNOSIS — Z96651 Presence of right artificial knee joint: Secondary | ICD-10-CM | POA: Diagnosis not present

## 2016-07-17 DIAGNOSIS — M9689 Other intraoperative and postprocedural complications and disorders of the musculoskeletal system: Secondary | ICD-10-CM | POA: Diagnosis not present

## 2016-07-17 DIAGNOSIS — A4101 Sepsis due to Methicillin susceptible Staphylococcus aureus: Secondary | ICD-10-CM | POA: Diagnosis not present

## 2016-07-17 DIAGNOSIS — R262 Difficulty in walking, not elsewhere classified: Secondary | ICD-10-CM | POA: Diagnosis not present

## 2016-07-17 DIAGNOSIS — M1990 Unspecified osteoarthritis, unspecified site: Secondary | ICD-10-CM | POA: Diagnosis not present

## 2016-07-17 DIAGNOSIS — Z96653 Presence of artificial knee joint, bilateral: Secondary | ICD-10-CM | POA: Diagnosis present

## 2016-07-17 DIAGNOSIS — N390 Urinary tract infection, site not specified: Secondary | ICD-10-CM | POA: Diagnosis present

## 2016-07-17 DIAGNOSIS — J449 Chronic obstructive pulmonary disease, unspecified: Secondary | ICD-10-CM | POA: Diagnosis present

## 2016-07-17 DIAGNOSIS — Z7401 Bed confinement status: Secondary | ICD-10-CM | POA: Diagnosis not present

## 2016-07-17 DIAGNOSIS — R7881 Bacteremia: Secondary | ICD-10-CM | POA: Diagnosis present

## 2016-07-17 DIAGNOSIS — M5136 Other intervertebral disc degeneration, lumbar region: Secondary | ICD-10-CM | POA: Diagnosis present

## 2016-07-17 DIAGNOSIS — Z5189 Encounter for other specified aftercare: Secondary | ICD-10-CM | POA: Diagnosis not present

## 2016-07-17 DIAGNOSIS — L03113 Cellulitis of right upper limb: Secondary | ICD-10-CM | POA: Diagnosis not present

## 2016-07-17 DIAGNOSIS — R0602 Shortness of breath: Secondary | ICD-10-CM | POA: Diagnosis not present

## 2016-07-17 DIAGNOSIS — B9561 Methicillin susceptible Staphylococcus aureus infection as the cause of diseases classified elsewhere: Secondary | ICD-10-CM | POA: Diagnosis present

## 2016-07-17 DIAGNOSIS — M6281 Muscle weakness (generalized): Secondary | ICD-10-CM | POA: Diagnosis not present

## 2016-07-17 DIAGNOSIS — M7989 Other specified soft tissue disorders: Secondary | ICD-10-CM | POA: Diagnosis not present

## 2016-07-17 DIAGNOSIS — Z95828 Presence of other vascular implants and grafts: Secondary | ICD-10-CM

## 2016-07-17 LAB — CBC
HCT: 32.1 % — ABNORMAL LOW (ref 35.0–47.0)
Hemoglobin: 10.6 g/dL — ABNORMAL LOW (ref 12.0–16.0)
MCH: 28.2 pg (ref 26.0–34.0)
MCHC: 33 g/dL (ref 32.0–36.0)
MCV: 85.3 fL (ref 80.0–100.0)
Platelets: 375 10*3/uL (ref 150–440)
RBC: 3.77 MIL/uL — ABNORMAL LOW (ref 3.80–5.20)
RDW: 15.4 % — ABNORMAL HIGH (ref 11.5–14.5)
WBC: 19.4 10*3/uL — ABNORMAL HIGH (ref 3.6–11.0)

## 2016-07-17 LAB — BASIC METABOLIC PANEL
Anion gap: 10 (ref 5–15)
BUN: 17 mg/dL (ref 6–20)
CO2: 26 mmol/L (ref 22–32)
Calcium: 8.7 mg/dL — ABNORMAL LOW (ref 8.9–10.3)
Chloride: 98 mmol/L — ABNORMAL LOW (ref 101–111)
Creatinine, Ser: 0.88 mg/dL (ref 0.44–1.00)
GFR calc Af Amer: >60 mL/min (ref >60)
GFR calc non Af Amer: >60 mL/min (ref >60)
Glucose, Bld: 104 mg/dL — ABNORMAL HIGH (ref 65–99)
Potassium: 4 mmol/L (ref 3.5–5.1)
Sodium: 134 mmol/L — ABNORMAL LOW (ref 135–145)

## 2016-07-17 LAB — PROTIME-INR
INR: 1.11
Prothrombin Time: 14.3 seconds (ref 11.4–15.2)

## 2016-07-17 LAB — TYPE AND SCREEN
ABO/RH(D): A POS
Antibody Screen: NEGATIVE

## 2016-07-17 LAB — APTT: aPTT: 45 seconds — ABNORMAL HIGH (ref 24–36)

## 2016-07-17 LAB — SEDIMENTATION RATE: Sed Rate: 58 mm/hr — ABNORMAL HIGH (ref 0–30)

## 2016-07-17 MED ORDER — FENTANYL CITRATE (PF) 100 MCG/2ML IJ SOLN
100.0000 ug | Freq: Once | INTRAMUSCULAR | Status: AC
  Administered 2016-07-17: 100 ug via INTRAVENOUS
  Filled 2016-07-17: qty 2

## 2016-07-17 MED ORDER — HYDROCODONE-ACETAMINOPHEN 5-325 MG PO TABS
1.0000 | ORAL_TABLET | Freq: Four times a day (QID) | ORAL | Status: DC | PRN
  Administered 2016-07-18: 2 via ORAL
  Administered 2016-07-18: 1 via ORAL
  Filled 2016-07-17 (×2): qty 2
  Filled 2016-07-17: qty 1

## 2016-07-17 MED ORDER — ASPIRIN EC 325 MG PO TBEC
325.0000 mg | DELAYED_RELEASE_TABLET | Freq: Every day | ORAL | Status: DC
  Administered 2016-07-19 – 2016-07-22 (×4): 325 mg via ORAL
  Filled 2016-07-17 (×6): qty 1

## 2016-07-17 MED ORDER — SODIUM CHLORIDE 0.9 % IV BOLUS (SEPSIS)
1000.0000 mL | Freq: Once | INTRAVENOUS | Status: AC
  Administered 2016-07-17: 1000 mL via INTRAVENOUS

## 2016-07-17 MED ORDER — CLINDAMYCIN PHOSPHATE 600 MG/50ML IV SOLN
600.0000 mg | Freq: Three times a day (TID) | INTRAVENOUS | Status: DC
  Administered 2016-07-18 – 2016-07-19 (×5): 600 mg via INTRAVENOUS
  Filled 2016-07-17 (×7): qty 50

## 2016-07-17 MED ORDER — CEFAZOLIN IN D5W 1 GM/50ML IV SOLN
1.0000 g | Freq: Three times a day (TID) | INTRAVENOUS | Status: DC
  Administered 2016-07-18 (×3): 1 g via INTRAVENOUS
  Filled 2016-07-17 (×5): qty 50

## 2016-07-17 MED ORDER — SODIUM CHLORIDE 0.9 % IV SOLN
INTRAVENOUS | Status: DC
  Administered 2016-07-17 – 2016-07-19 (×2): via INTRAVENOUS

## 2016-07-17 MED ORDER — MORPHINE SULFATE (PF) 4 MG/ML IV SOLN
2.0000 mg | INTRAVENOUS | Status: DC | PRN
  Administered 2016-07-17 – 2016-07-18 (×3): 2 mg via INTRAVENOUS
  Filled 2016-07-17 (×3): qty 1

## 2016-07-17 NOTE — ED Notes (Signed)
Report called to floor nurse.  Pt in u/s

## 2016-07-17 NOTE — H&P (Signed)
ADMISSION H&P  Chief Complaint: Right knee pain and erythema  HPI: [name] Douglas is a 75 y.o. female who presents to the ER from [facility].  She is status post right total knee arthroplasty by Dr. Hessie Knows 2 weeks ago. Patient describes the acute onset of right knee pain is getting last night. Patient states prior to that she had been doing well. She has noted redness over the anterior knee and drainage from the incision. She denies any fever or chills or other systemic symptoms of infection.  Past Medical History:  Diagnosis Date  . Anxiety   . Arthritis    "shoulders" (08/20/2014)  . Asthma   . Chronic lower back pain   . Complication of anesthesia    "agitated & restless after knee replacement in 2008"  . COPD (chronic obstructive pulmonary disease) ([place])   . DDD (degenerative disc disease), lumbar   . Depression   . Diverticulosis   . GERD (gastroesophageal reflux disease)   . High cholesterol   . Hypertension   . Hypothyroidism   . On home oxygen therapy    "2L at night" (08/20/2014)  . Shortness of breath dyspnea    with exertion   Past Surgical History:  Procedure Laterality Date  . CARDIAC CATHETERIZATION  ~ 2007  . EXCISIONAL HEMORRHOIDECTOMY  1960's  . EYE SURGERY Bilateral    Cataract extraction with IOL  . JOINT REPLACEMENT Left 2009  . KNEE ARTHROSCOPY Left X 2 <2008  . KNEE ARTHROSCOPY WITH SUBCHONDROPLASTY Right 04/19/2016   Procedure: KNEE ARTHROSCOPY WITH SUBCHONDROPLASTY, PARTIAL MENISCECTOMY;  Surgeon: Hessie Knows, MD;  Location: [facility] ORS;  Service: Orthopedics;  Laterality: Right;  . SHOULDER OPEN ROTATOR CUFF REPAIR Right 2001  . TONSILLECTOMY  ~ 1950  . TOTAL KNEE ARTHROPLASTY Left 2008  . TOTAL KNEE ARTHROPLASTY Right 07/03/2016   Procedure: TOTAL KNEE ARTHROPLASTY;  Surgeon: Hessie Knows, MD;  Location: [facility] ORS;  Service: Orthopedics;  Laterality: Right;  . TUBAL LIGATION  1973  . VAGINAL HYSTERECTOMY  1974   Social History   Social  History  . Marital status: Divorced    Spouse name: N/A  . Number of children: N/A  . Years of education: N/A   Social History Main Topics  . Smoking status: Current Every Day Smoker    Packs/day: 0.50    Years: 53.00    Types: Cigarettes  . Smokeless tobacco: Never Used  . Alcohol use No  . Drug use: No  . Sexual activity: No   Other Topics Concern  . None   Social History Narrative  . None   Family History  Problem Relation Age of Onset  . Diabetes Mother   . Hypertension Mother   . Lung cancer Mother   . Congestive Heart Failure Father   . Congestive Heart Failure Sister    No Known Allergies Prior to Admission medications   Medication Sig Start Date End Date Taking? Authorizing Provider  albuterol (PROVENTIL HFA;VENTOLIN HFA) 108 (90 BASE) MCG/ACT inhaler Inhale 2 puffs into the lungs every 6 (six) hours as needed for wheezing or shortness of breath. 08/28/14   Ripudeep Krystal Eaton, MD  albuterol (PROVENTIL) (2.5 MG/3ML) 0.083% nebulizer solution Take 3 mLs (2.5 mg total) by nebulization 2 (two) times daily. Patient taking differently: Take 2.5 mg by nebulization as needed.  08/28/14   Ripudeep Krystal Eaton, MD  atorvastatin (LIPITOR) 10 MG tablet Take 10 mg by mouth every evening.    Historical Provider, MD  Calcium Carbonate-Vitamin D (CALCIUM-VITAMIN D) 500-200 MG-UNIT tablet Take 1 tablet by mouth daily.    Historical Provider, MD  enoxaparin (LOVENOX) 30 MG/0.3ML injection Inject 0.3 mLs (30 mg total) into the skin every 12 (twelve) hours. 07/05/16   Reche Dixon, PA-C  gabapentin (NEURONTIN) 100 MG capsule Take 100 mg by mouth 3 (three) times daily.    Historical Provider, MD  HYDROcodone-acetaminophen (NORCO) 10-325 MG tablet Take 1-2 tablets by mouth every 4 (four) hours as needed for moderate pain. 07/05/16   Reche Dixon, PA-C  levothyroxine (SYNTHROID, LEVOTHROID) 88 MCG tablet Take 88 mcg by mouth daily before breakfast.    Historical Provider, MD  metoprolol (LOPRESSOR) 50 MG  tablet Take 50 mg by mouth every evening.     Historical Provider, MD  tiotropium (SPIRIVA HANDIHALER) 18 MCG inhalation capsule Place 1 capsule (18 mcg total) into inhaler and inhale daily. 08/28/14   Ripudeep Krystal Eaton, MD  traMADol (ULTRAM) 50 MG tablet Take 2 tablets (100 mg total) by mouth every 6 (six) hours. 07/05/16   Reche Dixon, PA-C  venlafaxine XR (EFFEXOR-XR) 75 MG 24 hr capsule Take 3 capsules (225 mg total) by mouth daily with breakfast. 04/25/16   Max Sane, MD     Positive ROS: All other systems have been reviewed and were otherwise negative with the exception of those mentioned in the HPI and as above.  Physical Exam: General: Alert, no acute distress Cardiovascular: Regular rate and rhythm, no murmurs rubs or gallops.  No pedal edema Respiratory: Clear to auscultation bilaterally, no wheezes rales or rhonchi. No cyanosis, no use of accessory musculature GI: No organomegaly, abdomen is soft and non-tender nondistended with positive bowel sounds. Skin: Skin intact, no lesions within the operative field. Neurologic: Sensation intact distally Psychiatric: Patient is competent for consent with normal mood and affect Lymphatic: No cervical lymphadenopathy  MUSCULOSKELETAL: Right knee: Patient's incision is approximated. There are staples in place. Patient has localized erythema and edema around the incision. The erythema is anterior. She does not appear to have a large effusion. There is no active drainage nor can fluid be expressed from the incision with palpation.  Distally the patient has palpable pedal pulses and intact sensation to light touch and intact motor function without detectable weakness. There is no lower extremity edema nor calf tenderness.  Assessment: Erythema surrounding surgical incision likely cellulitis  Plan: I evaluated the patient in the ER. There is erythema and edema around her incision suggesting cellulitis.  I'm going to admit her for IV antibiotics. Blood  cultures have been drawn in the ER. A CBC, CRP and ESR have been ordered. I will inform Dr. Rudene Christians in the morning of [name]'s and will allow him to make the decision regarding definitive treatment for his patient.     Thornton Park, MD   07/17/2016 9:48 PM

## 2016-07-17 NOTE — ED Notes (Signed)
ED Provider at bedside. 

## 2016-07-17 NOTE — ED Triage Notes (Signed)
Pt arrived via ems from [facility]. Pt had knee surgery 2 weeks ago. Yesterday pt began to have a throbbing pain in that right knee. The pain has progressively gotten worse since yesterday. Pt reports pain has become a shooting pain that is a 10 on a 0-10 scale.

## 2016-07-17 NOTE — ED Provider Notes (Signed)
[facility] Provider Note  ____________________________________________  Time seen: Approximately 9:23 PM  I have reviewed the triage vital signs and the nursing notes.   HISTORY  Chief Complaint Knee Pain    HPI [name] Douglas is a 75 y.o. female s/p R knee replacement 2 wks ago presenting with right knee swelling and pain, and incisional erythema and drainage. The patient reports that she was doing well postoperatively, able to ambulate with physical therapy, and last night when she started to get a "dull ache" in the posterior aspect of the knee. Today, the incisional site which is closed with staples developed a surrounding erythema, increased swelling, and drainage. The patient is unable to specify the type of drainage. She's not had any systemic symptoms including fever, nausea vomiting or anorexia.   Past Medical History:  Diagnosis Date  . Anxiety   . Arthritis    "shoulders" (08/20/2014)  . Asthma   . Chronic lower back pain   . Complication of anesthesia    "agitated & restless after knee replacement in 2008"  . COPD (chronic obstructive pulmonary disease) ([place])   . DDD (degenerative disc disease), lumbar   . Depression   . Diverticulosis   . GERD (gastroesophageal reflux disease)   . High cholesterol   . Hypertension   . Hypothyroidism   . On home oxygen therapy    "2L at night" (08/20/2014)  . Shortness of breath dyspnea    with exertion    Patient Active Problem List   Diagnosis Date Noted  . Primary localized osteoarthritis of right knee 07/03/2016  . Ileus ([place]) 04/24/2016  . Generalized abdominal pain   . Colitis due to Clostridium difficile   . Recurrent Clostridium difficile diarrhea   . Enteritis due to Clostridium difficile 08/22/2014  . Diarrhea 08/20/2014  . HTN (hypertension) 08/20/2014  . HLD (hyperlipidemia) 08/20/2014  . Hypothyroidism 08/20/2014  . Tobacco abuse 08/20/2014  . COPD (chronic  obstructive pulmonary disease) ([place]) 08/20/2014  . Back pain 08/20/2014    Past Surgical History:  Procedure Laterality Date  . CARDIAC CATHETERIZATION  ~ 2007  . EXCISIONAL HEMORRHOIDECTOMY  1960's  . EYE SURGERY Bilateral    Cataract extraction with IOL  . JOINT REPLACEMENT Left 2009  . KNEE ARTHROSCOPY Left X 2 <2008  . KNEE ARTHROSCOPY WITH SUBCHONDROPLASTY Right 04/19/2016   Procedure: KNEE ARTHROSCOPY WITH SUBCHONDROPLASTY, PARTIAL MENISCECTOMY;  Surgeon: Hessie Knows, MD;  Location: [facility] ORS;  Service: Orthopedics;  Laterality: Right;  . SHOULDER OPEN ROTATOR CUFF REPAIR Right 2001  . TONSILLECTOMY  ~ 1950  . TOTAL KNEE ARTHROPLASTY Left 2008  . TOTAL KNEE ARTHROPLASTY Right 07/03/2016   Procedure: TOTAL KNEE ARTHROPLASTY;  Surgeon: Hessie Knows, MD;  Location: [facility] ORS;  Service: Orthopedics;  Laterality: Right;  . TUBAL LIGATION  1973  . VAGINAL HYSTERECTOMY  1974    Current Outpatient Rx  . Order #: [id number] Class: Print  . Order #: [id number] Class: Print  . Order #: [id number] Class: Historical Med  . Order #: [id number] Class: Historical Med  . Order #: [id number] Class: Normal  . Order #: [id number] Class: Historical Med  . Order #: [id number] Class: Print  . Order #: [id number] Class: Historical Med  . Order #: [id number] Class: Historical Med  . Order #: [id number] Class: Print  . Order #: [id number] Class: Print  . Order #: [id number] Class: Normal    Allergies Patient has no known allergies.  Family History  Problem Relation Age of Onset  . Diabetes Mother   . Hypertension  Mother   . Lung cancer Mother   . Congestive Heart Failure Father   . Congestive Heart Failure Sister     Social History Social History  Substance Use Topics  . Smoking status: Current Every Day Smoker    Packs/day: 0.50    Years: 53.00    Types: Cigarettes  . Smokeless tobacco: Never Used  . Alcohol use No    Review of Systems Constitutional: No fever/chills.No lightheadedness or  syncope. Eyes: No visual changes. ENT: No sore throat. No congestion or rhinorrhea. Cardiovascular: Denies chest pain. Denies palpitations. Respiratory: Denies shortness of breath.  No cough. Gastrointestinal: No abdominal pain.  No nausea, no vomiting.  No diarrhea.  No constipation. Genitourinary: Negative for dysuria. Musculoskeletal: Negative for back pain. Positive for right knee pain. Skin: Negative for rash. Positive for surgical incision erythema, swelling, drainage, and pain. Neurological: Negative for headaches. No focal numbness, tingling or weakness.   10-point ROS otherwise negative.  ____________________________________________   PHYSICAL EXAM:  VITAL SIGNS: ED Triage Vitals  Enc Vitals Group     BP 07/17/16 2030 (!) 159/48     Pulse Rate 07/17/16 2030 (!) 104     Resp 07/17/16 2032 20     Temp 07/17/16 2032 98.1 F (36.7 C)     Temp Source 07/17/16 2032 Oral     SpO2 07/17/16 2030 (!) 89 %     Weight 07/17/16 2033 216 lb (98 kg)     Height 07/17/16 2033 5\' 6"  (1.676 m)     Head Circumference --      Peak Flow --      Pain Score 07/17/16 2033 10     Pain Loc --      Pain Edu? --      Excl. in [place]? --     Constitutional: Alert and oriented. Chronically ill-appearing and uncomfortable but nontoxic. Answers questions appropriately. Eyes: Conjunctivae are normal.  EOMI. No scleral icterus. Head: Atraumatic. Nose: No congestion/rhinnorhea. Mouth/Throat: Mucous membranes are moist.  Neck: No stridor.  Supple.   Cardiovascular: Normal rate, regular rhythm. No murmurs, rubs or gallops.  Respiratory: Normal respiratory effort.  No accessory muscle use or retractions. Lungs CTAB.  No wheezes, rales or ronchi. Gastrointestinal: Obese. Soft, nontender and nondistended.  No guarding or rebound.  No peritoneal signs. Musculoskeletal: The patient is a 12 inch surgical incision over the right knee anteriorly that is closed with staples without any drainage. She has  surrounding erythema and warmth to the mid thigh and down to the mid tibia. She has significant tenderness to palpation and is unable to comfortably bend her knee. She has normal DP and PT pulses on the right. She has normal sensation to light touch throughout the right lower extremity. Neurologic:  A&Ox3.  Speech is clear.  Face and smile are symmetric.  EOMI.  Moves all extremities well. Skin:  Skin is warm, dry and intact. No rash noted. Psychiatric: Mood and affect are normal. Speech and behavior are normal.  Normal judgement.  ____________________________________________   LABS (all labs ordered are listed, but only abnormal results are displayed)  Labs Reviewed  CULTURE, BLOOD (ROUTINE X 2)  CULTURE, BLOOD (ROUTINE X 2)  CBC  BASIC METABOLIC PANEL  URINALYSIS COMPLETEWITH MICROSCOPIC ([facility] ONLY)  SEDIMENTATION RATE  C-REACTIVE PROTEIN  APTT  PROTIME-INR  TYPE AND SCREEN   ____________________________________________  EKG  Not indicated ____________________________________________  RADIOLOGY  No results found.  ____________________________________________   PROCEDURES  Procedure(s) performed: None  Procedures  Critical Care performed: No ____________________________________________   INITIAL IMPRESSION / ASSESSMENT AND PLAN / ED COURSE  Pertinent labs & imaging results that were available during my care of the patient were reviewed by me and considered in my medical decision making (see chart for details).  75 y.o. female 2 weeks out from a right knee replacement with signs and symptoms of infection. At the minimum, the patient has a severe cellulitis that will require antibiotics. I am also concerned and about a joint infection, septic arthritis. She does not have systemic symptoms at this time. I have spoken with Dr. Mack Guise, who will admit the patient for further treatment and evaluation. He will put in orders for  antibiotics.  ____________________________________________  FINAL CLINICAL IMPRESSION(S) / ED DIAGNOSES  Final diagnoses:  Cellulitis of right knee  Postoperative surgical complication involving musculoskeletal system associated with musculoskeletal procedure, unspecified complication    Clinical Course       NEW MEDICATIONS STARTED DURING THIS VISIT:  New Prescriptions   No medications on file      Eula Listen, MD 07/17/16 2126

## 2016-07-17 NOTE — ED Notes (Addendum)
Pt brought in via [facility] from [facility].  Pt had knee replacement on right knee 2 weeks ago by dr Rudene Christians.  Today, pt had increased pain behind right knee all day and throbbing pain yesterday.  Pt is on 2 liters oxygen at night.  Pt alert.  Family with pt.

## 2016-07-17 NOTE — Patient Outreach (Signed)
[facility]) Care Management  07/17/2016  [name] Douglas [birth year] [id number]   Spoke with patient at bedside. Patient reports Her MD is Dr. Clayborn Bigness, she lives alone. She states her daughter assists with transportation and she has no difficulty getting her medications. She was hoping to be home by end of week. Unsure of discharge date at this time.  Patient took [facility] program brochure for future reference. She does not feel she has any needs at this time for [facility] program services.  RNCM will sign off, notified facility SW that patient is [name] eligible for any new discharge planning needs.  Royetta Crochet. Laymond Purser, RN, BSN, [place] Acute Care Coordinator [phone]

## 2016-07-18 LAB — CBC
HCT: 31.5 % — ABNORMAL LOW (ref 35.0–47.0)
Hemoglobin: 10.4 g/dL — ABNORMAL LOW (ref 12.0–16.0)
MCH: 28.6 pg (ref 26.0–34.0)
MCHC: 33.2 g/dL (ref 32.0–36.0)
MCV: 86.2 fL (ref 80.0–100.0)
Platelets: 348 10*3/uL (ref 150–440)
RBC: 3.65 MIL/uL — ABNORMAL LOW (ref 3.80–5.20)
RDW: 15.5 % — ABNORMAL HIGH (ref 11.5–14.5)
WBC: 18.6 10*3/uL — ABNORMAL HIGH (ref 3.6–11.0)

## 2016-07-18 LAB — BASIC METABOLIC PANEL
Anion gap: 6 (ref 5–15)
BUN: 16 mg/dL (ref 6–20)
CO2: 30 mmol/L (ref 22–32)
Calcium: 8.4 mg/dL — ABNORMAL LOW (ref 8.9–10.3)
Chloride: 99 mmol/L — ABNORMAL LOW (ref 101–111)
Creatinine, Ser: 0.84 mg/dL (ref 0.44–1.00)
GFR calc Af Amer: >60 mL/min (ref >60)
GFR calc non Af Amer: >60 mL/min (ref >60)
Glucose, Bld: 103 mg/dL — ABNORMAL HIGH (ref 65–99)
Potassium: 4.1 mmol/L (ref 3.5–5.1)
Sodium: 135 mmol/L (ref 135–145)

## 2016-07-18 LAB — URINALYSIS COMPLETE WITH MICROSCOPIC (ARMC ONLY)
Bilirubin Urine: NEGATIVE
Glucose, UA: NEGATIVE mg/dL
Hgb urine dipstick: NEGATIVE
Ketones, ur: NEGATIVE mg/dL
Nitrite: NEGATIVE
Protein, ur: NEGATIVE mg/dL
Specific Gravity, Urine: 1.015 (ref 1.005–1.030)
pH: 6 (ref 5.0–8.0)

## 2016-07-18 LAB — MRSA PCR SCREENING: MRSA by PCR: NEGATIVE

## 2016-07-18 LAB — C-REACTIVE PROTEIN: CRP: 27.6 mg/dL — ABNORMAL HIGH (ref <1.0)

## 2016-07-18 MED ORDER — TRAMADOL HCL 50 MG PO TABS
100.0000 mg | ORAL_TABLET | Freq: Four times a day (QID) | ORAL | Status: DC
  Administered 2016-07-18 – 2016-07-22 (×16): 100 mg via ORAL
  Filled 2016-07-18 (×16): qty 2

## 2016-07-18 MED ORDER — CEFAZOLIN SODIUM-DEXTROSE 2-4 GM/100ML-% IV SOLN
2.0000 g | Freq: Three times a day (TID) | INTRAVENOUS | Status: DC
  Administered 2016-07-18 – 2016-07-22 (×10): 2 g via INTRAVENOUS
  Filled 2016-07-18 (×13): qty 100

## 2016-07-18 MED ORDER — OXYCODONE HCL ER 15 MG PO T12A
15.0000 mg | EXTENDED_RELEASE_TABLET | Freq: Two times a day (BID) | ORAL | Status: DC
  Administered 2016-07-18 – 2016-07-22 (×9): 15 mg via ORAL
  Filled 2016-07-18 (×9): qty 1

## 2016-07-18 MED ORDER — OXYCODONE-ACETAMINOPHEN 7.5-325 MG PO TABS
2.0000 | ORAL_TABLET | ORAL | Status: DC | PRN
  Administered 2016-07-18: 2 via ORAL
  Filled 2016-07-18: qty 2

## 2016-07-18 NOTE — Progress Notes (Signed)
Having pain out of control, will change meds. D/c staples. Resume PT

## 2016-07-18 NOTE — Clinical Social Work Note (Signed)
Clinical Social Work Assessment  Patient Details  Name: [name] Douglas MRN: [id number] Date of Birth: 05/23/1941  Date of referral:  07/18/16               Reason for consult:  Facility Placement                Permission sought to share information with:  Chartered certified accountant granted to share information::  Yes, Verbal Permission Granted  Name::      [name]::   Douglas   Relationship::     Contact Information:     Housing/Transportation Living arrangements for the past 2 months:  [place], [place] of Information:  Patient, Adult Children Patient Interpreter Needed:  None Criminal Activity/Legal Involvement Pertinent to Current Situation/Hospitalization:  No - Comment as needed Significant Relationships:  Adult Children Lives with:  Self, Facility Resident Do you feel safe going back to the place where you live?  Yes Need for family participation in patient care:  Yes (Comment)  Care giving concerns:  Patient was recently placed at [name] for short term rehab from [name] [facility] on 07/06/16 and is a readmission.    Social Worker assessment / plan:  Holiday representative ([name] Douglas) reviewed chart and noted that patient is from [name]. CSW contacted [name] Douglas admissions coordinator at [name] who reported that patient was there for short term rehab and can return when stable. CSW made [name] aware that patient may need IV Abx. CSW met with patient and her daughter [name] Douglas [phone] was at bedside. CSW introduced self and explained role of CSW department. Patient was alert and oriented and laying in the bed. Per patient she has been at [name] around 10 days for rehab. Patient reported that she lives in [place] alone. Daughter asked if patient can go to [name] or [name] instead of back to [name] [facility]. CSW explained that a referral can be sent however patient may need IV Abx, which may limit  SNF options. Patient and daughter verbalized their understanding. Per patient she will return to [name] if she can't get into [name]. CSW explained that since patient has used around 10 rehab days, the days will not start over and she will likely have around 10 days left before she is in her co-pay days. Patient verbalized her understanding.   FL2 complete and faxed out. CSW will continue to follow and assist as needed.   Employment status:  Retired Forensic scientist:  Medicare PT Recommendations:  Not assessed at this time [place] / Referral to community resources:  [name] Douglas  Patient/Family's Response to care:  Patient and daughter prefer [name].   Patient/Family's Understanding of and Emotional Response to Diagnosis, Current Treatment, and Prognosis:  Patient and daughter were pleasant and thanked CSW for visit.   Emotional Assessment Appearance:  Appears stated age Attitude/Demeanor/Rapport:    Affect (typically observed):  Accepting, Adaptable, Pleasant Orientation:  Oriented to Self, Oriented to Place, Oriented to  Time, Oriented to Situation Alcohol / Substance use:  Not Applicable Psych involvement (Current and /or in the community):  No (Comment)  Discharge Needs  Concerns to be addressed:  Discharge Planning Concerns Readmission within the last 30 days:  Yes Current discharge risk:  Dependent with Mobility Barriers to Discharge:  Continued Medical Work up   [employer], [name] Beets, LCSW 07/18/2016, 11:14 AM

## 2016-07-18 NOTE — Progress Notes (Signed)
Patient has serous/yellowish drainage from right knee steri strips. MD aware instructed RN to place honeycomb dressing on it. MD said that it is okay to place knee immobilizer on top of it.   Deri Fuelling, RN

## 2016-07-18 NOTE — Evaluation (Signed)
Physical Therapy Evaluation Patient Details Name: [name] Douglas MRN: [id number] DOB: Apr 20, 1941 Today's Date: 07/18/2016   History of Present Illness  Pt is a 75 y/o F s/p R TKA by Dr. Rudene Christians ~2 wks prior.  Pt noticed redness and drainage from the incision.  Pt was admitted for IV antibiotics.  Pt's PMH includes anxiety, chronic low back pain, COPD, DDD lumbar, 2L O2 at night, L TKA.    Clinical Impression  Pt admitted with above diagnosis. Pt currently with functional limitations due to the deficits listed below (see PT Problem List). [name] mobility was limited today due to severe pain in R knee.  She completed therapeutic exercises.  She requires mod assist to pivot to [facility] as pt does not demonstrate safe technique due to R knee pain and impulsivity.  RN left callback # with Dr. Rudene Christians at end of session to inquire about use of KI as no dressing over R knee.  Waiting to hear back.  Recommending SNF at d/c.  Pt will benefit from skilled PT to increase their independence and safety with mobility to allow discharge to the venue listed below.      Follow Up Recommendations SNF    Equipment Recommendations  Other (comment) (TBD at next venue of care)    Recommendations for Other Services OT consult     Precautions / Restrictions Precautions Precautions: Fall Restrictions Weight Bearing Restrictions: Yes RLE Weight Bearing: Weight bearing as tolerated (spoke with Dr. Rudene Christians over the phone who confirmed WBAT)      Mobility  Bed Mobility Overal bed mobility: Needs Assistance Bed Mobility: Supine to Sit;Sit to Supine     Supine to sit: Min guard;HOB elevated Sit to supine: Min assist   General bed mobility comments: Heavy use of bed rail to pull up to sitting with increased time and effort.  Min assist to bring RLE into bed to return to supine.  Transfers Overall transfer level: Needs assistance Equipment used: Rolling walker (2 wheeled) Transfers: Sit to/from [employer] Sit to Stand: Mod assist Stand pivot transfers: Mod assist       General transfer comment: Pt requires mod assist to boost to standing and to steady due to pain.  During pivot to [facility] pt slides RLE and moves quickly and impulsively sitting prematurely due to need to void and requiring assist to direct hips to [facility].  Ambulation/Gait             General Gait Details: Unable.  Cues to keep R knee straight and to push through BUEs to take weight off of RLE but pt unable to step due to pain.  Stairs            Wheelchair Mobility    Modified Rankin (Stroke Patients Only)       Balance Overall balance assessment: Needs assistance Sitting-balance support: No upper extremity supported;Feet supported Sitting balance-Leahy Scale: Good     Standing balance support: Bilateral upper extremity supported;During functional activity Standing balance-Leahy Scale: Poor Standing balance comment: Relies on RW for support                             Pertinent Vitals/Pain Pain Assessment: Faces Faces Pain Scale: Hurts worst Pain Location: R knee Pain Descriptors / Indicators: Aching;Grimacing;Guarding;Moaning Pain Intervention(s): Limited activity within patient's tolerance;Monitored during session;Repositioned;Premedicated before session;Patient requesting pain meds-RN notified    Home Living Family/patient expects to be discharged to:: Skilled nursing  facility               Home Equipment: Gilford Rile - 2 wheels;Cane - single point      Prior Function Level of Independence: Independent with assistive device(s)         Comments: At [facility] was working with PT 2x/day and able to ambulate in hallway to PT gym with RW and PT supervision.     Hand Dominance   Dominant Hand: Right    Extremity/Trunk Assessment   Upper Extremity Assessment: Defer to OT evaluation           Lower Extremity Assessment: RLE deficits/detail RLE Deficits /  Details: Very gaurded with R knee ROM due to pain with limited strength     Cervical / Trunk Assessment: Kyphotic  Communication   Communication: No difficulties  Cognition Arousal/Alertness: Awake/alert Behavior During Therapy: Anxious Overall Cognitive Status: Within Functional Limits for tasks assessed                      General Comments General comments (skin integrity, edema, etc.): RN left callback # with Dr. Rudene Christians at end of session to inquire about use of KI as no dressing over R knee.  Waiting to hear back.  SpO2 remains at or above 95% on RA throughout session.  Reapplied 2L O2 via [place] as pt was found at end of session, RN notified.    Exercises Total Joint Exercises Ankle Circles/Pumps: AROM;Both;10 reps;Supine Quad Sets: Strengthening;Both;10 reps;Supine Heel Slides: AAROM;Right;10 reps;Supine Straight Leg Raises: Strengthening;Right;10 reps;Supine Knee Flexion: AAROM;Right;10 reps;Seated;Other (comment) (with 5 second holds into flexion) Goniometric ROM: ~60 deg R knee flexion, limited due to pain   Assessment/Plan    PT Assessment Patient needs continued PT services  PT Problem List Decreased strength;Decreased range of motion;Decreased activity tolerance;Decreased balance;Decreased mobility;Decreased knowledge of use of DME;Decreased safety awareness;Decreased knowledge of precautions;Pain          PT Treatment Interventions DME instruction;Gait training;Functional mobility training;Therapeutic activities;Therapeutic exercise;Balance training;Neuromuscular re-education;Patient/family education;Modalities    PT Goals (Current goals can be found in the Care Plan section)  Acute Rehab PT Goals Patient Stated Goal: decreased pain PT Goal Formulation: With patient/family Time For Goal Achievement: 08/01/16 Potential to Achieve Goals: Good    Frequency BID   Barriers to discharge Decreased caregiver support      Co-evaluation               End of  Session Equipment Utilized During Treatment: Gait belt;Oxygen Activity Tolerance: Patient limited by pain Patient left: in bed;with call bell/phone within reach;with bed alarm set;with SCD's reapplied;with family/visitor present Nurse Communication: Mobility status;Patient requests pain meds;Weight bearing status         Time: 1425-1505 PT Time Calculation (min) (ACUTE ONLY): 40 min   Charges:   PT Evaluation $PT Eval Moderate Complexity: 1 Procedure PT Treatments $Therapeutic Exercise: 8-22 mins $Therapeutic Activity: 8-22 mins   PT G Codes:        Collie Siad PT, DPT 07/18/2016, 3:23 PM

## 2016-07-18 NOTE — Clinical Social Work Placement (Signed)
   CLINICAL SOCIAL WORK PLACEMENT  NOTE  Date:  07/18/2016  Patient Details  Name: [name] Douglas MRN: [id number] Date of Birth: [birth year]  Clinical Social Work is seeking post-discharge placement for this patient at the [place] level of care (*CSW will initial, date and re-position this form in  chart as items are completed):  Yes   Patient/family provided with [place] Work Department's list of facilities offering this level of care within the geographic area requested by the patient (or if unable, by the patient's family).  Yes   Patient/family informed of their freedom to choose among providers that offer the needed level of care, that participate in Medicare, Medicaid or managed care program needed by the patient, have an available bed and are willing to accept the patient.  Yes   Patient/family informed of [place]'s ownership interest in [facility] and [facility], as well as of the fact that they are under no obligation to receive care at these facilities.  PASRR submitted to EDS on       PASRR number received on       Existing PASRR number confirmed on 07/18/16     FL2 transmitted to all facilities in geographic area requested by pt/family on 07/18/16     FL2 transmitted to all facilities within larger geographic area on       Patient informed that his/her managed care company has contracts with or will negotiate with certain facilities, including the following:            Patient/family informed of bed offers received.  Patient chooses bed at       Physician recommends and patient chooses bed at      Patient to be transferred to   on  .  Patient to be transferred to facility by       Patient family notified on   of transfer.  Name of family member notified:        PHYSICIAN       Additional Comment:    _______________________________________________ Jaran Sainz, Veronia Beets, LCSW 07/18/2016, 11:13 AM

## 2016-07-18 NOTE — Progress Notes (Signed)
PHARMACY - PHYSICIAN COMMUNICATION CRITICAL VALUE ALERT - BLOOD CULTURE IDENTIFICATION (BCID)  No results found for this or any previous visit.  Name of physician (or Provider) Contacted: Dr Rudene Christians   Changes to prescribed antibiotics required: Staph Aureus (Mec A negative) growing in anaerobic bottle.  Will change from cefazolin 1 gm IV Q8H to cefazolin 2 gm IV Q8H.   [name] Douglas D 07/18/2016  4:36 PM

## 2016-07-18 NOTE — NC FL2 (Signed)
[place] LEVEL OF CARE SCREENING TOOL     IDENTIFICATION  Patient Name: [name] Douglas Birthdate: 1940-09-11 Sex: female Admission Date (Current Location): 07/17/2016  [facility] and [place] Number:  [name] Douglas  ([id number]) Facility and Address:  [facility], [address] [place],  [address]      Provider Number: [id number]  Attending Physician Name and Address:  Thornton Park, MD  Relative Name and Phone Number:       Current Level of Care: Hospital Recommended Level of Care: [place] Prior Approval Number:    Date Approved/Denied:   PASRR Number:  ([id number])  Discharge Plan: SNF    Current Diagnoses: Patient Active Problem List   Diagnosis Date Noted  . Cellulitis of right knee 07/17/2016  . Primary localized osteoarthritis of right knee 07/03/2016  . Ileus ([place]) 04/24/2016  . Generalized abdominal pain   . Colitis due to Clostridium difficile   . Recurrent Clostridium difficile diarrhea   . Enteritis due to Clostridium difficile 08/22/2014  . Diarrhea 08/20/2014  . HTN (hypertension) 08/20/2014  . HLD (hyperlipidemia) 08/20/2014  . Hypothyroidism 08/20/2014  . Tobacco abuse 08/20/2014  . COPD (chronic obstructive pulmonary disease) ([place]) 08/20/2014  . Back pain 08/20/2014    Orientation RESPIRATION BLADDER Height & Weight     Self, Time, Situation, Place  O2 (2 Liters Oxygen ) Continent Weight: 216 lb (98 kg) Height:  5\' 6"  (167.6 cm)  BEHAVIORAL SYMPTOMS/MOOD NEUROLOGICAL BOWEL NUTRITION STATUS   (none)  (none) Incontinent Diet (NPO Time Specified (Please see D/C Summary for updated diet) )  AMBULATORY STATUS COMMUNICATION OF NEEDS Skin   Extensive Assist Verbally Surgical wounds (07/03/16: Incision Right Knee. )                       Personal Care Assistance Level of Assistance  Bathing, Feeding, Dressing Bathing Assistance: Limited assistance Feeding assistance:  Independent Dressing Assistance: Limited assistance     Functional Limitations Info  Sight, Hearing, Speech Sight Info: Adequate Hearing Info: Adequate Speech Info: Adequate    SPECIAL CARE FACTORS FREQUENCY  PT (By licensed PT), OT (By licensed OT)     PT Frequency:  (5) OT Frequency:  (5)            Contractures      Additional Factors Info  Code Status, Allergies Code Status Info:  (Full Code. ) Allergies Info:  (No Known Allergies. )           Current Medications (07/18/2016):  This is the current hospital active medication list Current Facility-Administered Medications  Medication Dose Route Frequency Provider Last Rate Last Dose  . 0.9 %  sodium chloride infusion   Intravenous Continuous Thornton Park, MD 75 mL/hr at 07/17/16 2325    . aspirin EC tablet 325 mg  325 mg Oral Daily Thornton Park, MD   Stopped at 07/18/16 [phone]  . ceFAZolin (ANCEF) IVPB 1 g/50 mL premix  1 g Intravenous Q8H Thornton Park, MD   1 g at 07/18/16 0849  . clindamycin (CLEOCIN) IVPB 600 mg  600 mg Intravenous Q8H Thornton Park, MD   600 mg at 07/18/16 0749  . HYDROcodone-acetaminophen (NORCO/VICODIN) 5-325 MG per tablet 1-2 tablet  1-2 tablet Oral Q6H PRN Thornton Park, MD   2 tablet at 07/18/16 0849  . morphine 4 MG/ML injection 2 mg  2 mg Intravenous Q2H PRN Thornton Park, MD   2 mg at 07/18/16 [phone]  Discharge Medications: Please see discharge summary for a list of discharge medications.  Relevant Imaging Results:  Relevant Lab Results:   Additional Information  (SSN: [id number])  Sample, [name] Beets, LCSW

## 2016-07-18 NOTE — Progress Notes (Addendum)
Clinical Education officer, museum (CSW) presented bed offers to patient and her daughter. Per patient she will discuss offers with her daughter and get back to CSW. CSW will continue to follow and assist as needed.   CSW met with patient this afternoon to get SNF choice. Patient chose to go back to [facility]. Patient and daughter voiced concerns about discharging tomorrow. Per patient her knee hurts worse then when she had surgery and she can't even stand on it. CSW encouraged patient and daughter to speak with MD about their concerns.  [employer], LCSW [phone]

## 2016-07-18 NOTE — Progress Notes (Signed)
On assessment patient having right knee pain. Heart sounds normal, lung sounds clear. Right knee swollen, red, and hot. Patient receiving IV ABX. RN removed staples per MD order and placed steri strips. Patient has some serous/yellowish draining from incision site when staples were being removed.   Deri Fuelling, RN

## 2016-07-19 ENCOUNTER — Inpatient Hospital Stay: Payer: Medicare Other

## 2016-07-19 LAB — URINE CULTURE

## 2016-07-19 LAB — BLOOD CULTURE ID PANEL (REFLEXED)

## 2016-07-19 MED ORDER — VANCOMYCIN HCL IN DEXTROSE 1-5 GM/200ML-% IV SOLN
1000.0000 mg | Freq: Two times a day (BID) | INTRAVENOUS | Status: DC
  Filled 2016-07-19: qty 200

## 2016-07-19 MED ORDER — MAGNESIUM HYDROXIDE 400 MG/5ML PO SUSP
30.0000 mL | Freq: Every day | ORAL | Status: DC | PRN
  Administered 2016-07-19: 30 mL via ORAL

## 2016-07-19 MED ORDER — ONDANSETRON 4 MG PO TBDP
8.0000 mg | ORAL_TABLET | Freq: Three times a day (TID) | ORAL | Status: DC | PRN
  Administered 2016-07-19: 8 mg via ORAL
  Filled 2016-07-19: qty 2

## 2016-07-19 MED ORDER — MAGNESIUM CITRATE PO SOLN
0.5000 | Freq: Every day | ORAL | Status: DC | PRN
  Filled 2016-07-19: qty 296

## 2016-07-19 MED ORDER — VANCOMYCIN HCL IN DEXTROSE 1-5 GM/200ML-% IV SOLN
1000.0000 mg | Freq: Once | INTRAVENOUS | Status: DC
  Filled 2016-07-19: qty 200

## 2016-07-19 MED ORDER — DOCUSATE SODIUM 100 MG PO CAPS
100.0000 mg | ORAL_CAPSULE | Freq: Two times a day (BID) | ORAL | Status: DC
  Administered 2016-07-19 – 2016-07-22 (×6): 100 mg via ORAL
  Filled 2016-07-19 (×7): qty 1

## 2016-07-19 NOTE — Progress Notes (Signed)
Pt complaining of chest pain. VS taken ( see flowsheet), MD Rudene Christians notified. Orders received for 12 lead EKG.

## 2016-07-19 NOTE — Progress Notes (Signed)
Per MD note patient will need "min of 2 weeks IV abx." Plan is for patient to D/C back to [facility] when stable. [facility] admissions coordinator at [facility] is aware of above. CSW will continue to follow and assist as needed.   [employer], LCSW [phone]

## 2016-07-19 NOTE — Progress Notes (Signed)
Physical Therapy Treatment Patient Details Name: [name] Douglas MRN: [id number] DOB: 03-28-1941 Today's Date: 07/19/2016    History of Present Illness Pt is a 75 y/o F s/p R TKA by Dr. Rudene Christians ~2 wks prior.  Pt noticed redness and drainage from the incision.  Pt was admitted for IV antibiotics.  Pt's PMH includes anxiety, chronic low back pain, COPD, DDD lumbar, 2L O2 at night, L TKA.    PT Comments    Pt in bed ready for session.  Participated in exercises as described below.  To edge of bed with mod assist.  She was able to stand today with min a x 2 and pivot to chair slowly taking very small guarded steps.  Pt with overall improved mobility today but remains significantly limited by pain.  ROM 5-45 in supine limited by pain.   Follow Up Recommendations  SNF     Equipment Recommendations       Recommendations for Other Services       Precautions / Restrictions Precautions Precautions: Fall Restrictions Weight Bearing Restrictions: Yes    Mobility  Bed Mobility Overal bed mobility: Needs Assistance Bed Mobility: Supine to Sit     Supine to sit: Mod assist     General bed mobility comments: Heavy use of bed rail to pull up to sitting with increased time and effort.  Min assist to bring RLE into bed to return to supine.  Transfers Overall transfer level: Needs assistance Equipment used: Rolling walker (2 wheeled) Transfers: Sit to/from Stand Sit to Stand: Min assist Stand pivot transfers: Mod assist       General transfer comment: improved mobility today  KI donned  Ambulation/Gait Ambulation/Gait assistance: Min assist;+2 physical assistance Ambulation Distance (Feet): 3 Feet Assistive device: Rolling walker (2 wheeled) Gait Pattern/deviations: Step-to pattern   Gait velocity interpretation: <1.8 ft/sec, indicative of risk for recurrent falls General Gait Details: able to take a few small steps today but turn but unable to amublate any distances due to  pain.   Stairs            Wheelchair Mobility    Modified Rankin (Stroke Patients Only)       Balance Overall balance assessment: Needs assistance Sitting-balance support: Feet supported Sitting balance-Leahy Scale: Good     Standing balance support: Bilateral upper extremity supported Standing balance-Leahy Scale: Fair                      Cognition Arousal/Alertness: Awake/alert Behavior During Therapy: Anxious Overall Cognitive Status: Within Functional Limits for tasks assessed                      Exercises Total Joint Exercises Ankle Circles/Pumps: AROM;Both;10 reps;Supine Quad Sets: Strengthening;Both;10 reps;Supine Gluteal Sets: AROM;Both;10 reps;Supine Heel Slides: AAROM;Right;10 reps;Supine Hip ABduction/ADduction: AAROM;Right;10 reps;Supine Straight Leg Raises: AAROM;Right;10 reps;Supine Knee Flexion: AAROM;Right;10 reps;Supine Goniometric ROM: 5-45 supine.  limited by pain    General Comments        Pertinent Vitals/Pain Pain Assessment: 0-10 Pain Score: 7  Pain Location: R knee Pain Descriptors / Indicators: Aching;Constant Pain Intervention(s): Limited activity within patient's tolerance    Home Living                      Prior Function            PT Goals (current goals can now be found in the care plan section) Progress towards PT goals: Progressing toward goals  Frequency    BID      PT Plan Current plan remains appropriate    Co-evaluation             End of Session Equipment Utilized During Treatment: Gait belt;Oxygen Activity Tolerance: Patient limited by pain Patient left: in chair;with call bell/phone within reach;with chair alarm set;with family/visitor present     Time: [id number] PT Time Calculation (min) (ACUTE ONLY): 23 min  Charges:  $Gait Training: 8-22 mins $Therapeutic Exercise: 8-22 mins                    G Codes:      Chesley Noon 07/31/2016, 10:18 AM

## 2016-07-19 NOTE — Consult Note (Signed)
[facility] Infectious Disease     Reason for Consult: Leukocytosis, bacteremia   Referring Physician: Koleen Nimrod Date of Admission:  07/17/2016   Active Problems:   Cellulitis of right knee   HPI: [name] Douglas is a 75 y.o. female s/p recent  R TKR on 07/03/16 who was admitted from [facility] with R knee pain and redness acutely over the last -3 days. On admission she had no fever but WBC 19 and bcx are + 2/2 Staph aureus.  She has been started on ancef and clindamycin.    Past Medical History:  Diagnosis Date  . Anxiety   . Arthritis    "shoulders" (08/20/2014)  . Asthma   . Chronic lower back pain   . Complication of anesthesia    "agitated & restless after knee replacement in 2008"  . COPD (chronic obstructive pulmonary disease) (HCC)   . DDD (degenerative disc disease), lumbar   . Depression   . Diverticulosis   . GERD (gastroesophageal reflux disease)   . High cholesterol   . Hypertension   . Hypothyroidism   . On home oxygen therapy    "2L at night" (08/20/2014)  . Shortness of breath dyspnea    with exertion   Past Surgical History:  Procedure Laterality Date  . CARDIAC CATHETERIZATION  ~ 2007  . EXCISIONAL HEMORRHOIDECTOMY  1960's  . EYE SURGERY Bilateral    Cataract extraction with IOL  . JOINT REPLACEMENT Left 2009  . KNEE ARTHROSCOPY Left X 2 <2008  . KNEE ARTHROSCOPY WITH SUBCHONDROPLASTY Right 04/19/2016   Procedure: KNEE ARTHROSCOPY WITH SUBCHONDROPLASTY, PARTIAL MENISCECTOMY;  Surgeon: Kennedy Bucker, MD;  Location: [facility] ORS;  Service: Orthopedics;  Laterality: Right;  . SHOULDER OPEN ROTATOR CUFF REPAIR Right 2001  . TONSILLECTOMY  ~ 1950  . TOTAL KNEE ARTHROPLASTY Left 2008  . TOTAL KNEE ARTHROPLASTY Right 07/03/2016   Procedure: TOTAL KNEE ARTHROPLASTY;  Surgeon: Kennedy Bucker, MD;  Location: [facility] ORS;  Service: Orthopedics;  Laterality: Right;  . TUBAL LIGATION  1973  . VAGINAL HYSTERECTOMY  1974   Social History  Substance Use Topics  .  Smoking status: Current Every Day Smoker    Packs/day: 0.50    Years: 53.00    Types: Cigarettes  . Smokeless tobacco: Never Used  . Alcohol use No   Family History  Problem Relation Age of Onset  . Diabetes Mother   . Hypertension Mother   . Lung cancer Mother   . Congestive Heart Failure Father   . Congestive Heart Failure Sister     Allergies: No Known Allergies  Current antibiotics: Antibiotics Given (last 72 hours)    Date/Time Action Medication Dose Rate   07/18/16 0017 Given   ceFAZolin (ANCEF) IVPB 1 g/50 mL premix 1 g 100 mL/hr   07/18/16 0017 Given   clindamycin (CLEOCIN) IVPB 600 mg 600 mg 100 mL/hr   07/18/16 0749 Given   clindamycin (CLEOCIN) IVPB 600 mg 600 mg 100 mL/hr   07/18/16 0849 Given   ceFAZolin (ANCEF) IVPB 1 g/50 mL premix 1 g 100 mL/hr   07/18/16 1627 Given   ceFAZolin (ANCEF) IVPB 1 g/50 mL premix 1 g 100 mL/hr   07/18/16 1748 Given   clindamycin (CLEOCIN) IVPB 600 mg 600 mg 100 mL/hr   07/18/16 2331 Given   ceFAZolin (ANCEF) IVPB 2g/100 mL premix 2 g 200 mL/hr   07/19/16 0022 Given   clindamycin (CLEOCIN) IVPB 600 mg 600 mg 100 mL/hr   07/19/16 0544 Given  ceFAZolin (ANCEF) IVPB 2g/100 mL premix 2 g 200 mL/hr      MEDICATIONS: . aspirin EC  325 mg Oral Daily  .  ceFAZolin (ANCEF) IV  2 g Intravenous Q8H  . clindamycin (CLEOCIN) IV  600 mg Intravenous Q8H  . oxyCODONE  15 mg Oral Q12H  . traMADol  100 mg Oral Q6H    Review of Systems - 11 systems reviewed and negative per HPI   OBJECTIVE: Temp:  [97.6 F (36.4 C)-99.2 F (37.3 C)] 98.2 F (36.8 C) (11/30 0750) Pulse Rate:  [105-112] 111 (11/30 0750) Resp:  [17-20] 20 (11/30 0458) BP: (124-182)/(39-63) 182/63 (11/30 0750) SpO2:  [92 %-100 %] 92 % (11/30 0750) Physical Exam  Constitutional:  oriented to person, place, and time. appears well-developed and well-nourished. No distress.  HENT: [place]/AT, PERRLA, no scleral icterus Mouth/Throat: Oropharynx is clear and moist. No  oropharyngeal exudate.  Cardiovascular: Normal rate, regular rhythm and normal heart sounds.2/6 SM Pulmonary/Chest: Effort normal and breath sounds normal. No respiratory distress.  has no wheezes.  Neck = supple, no nuchal rigidity Abdominal: Soft. Bowel sounds are normal.  exhibits no distension. There is no tenderness.  Ext R knee with aproxiamted incision, but sign warmth and swelling . Marked pain with any movement, ttp Lymphadenopathy: no cervical adenopathy. No axillary adenopathy Neurological: alert and oriented to person, place, and time.  Skin: erythema around R knee anterior Psychiatric: a normal mood and affect.  behavior is normal.    LABS: Results for orders placed or performed during the hospital encounter of 07/17/16 (from the past 48 hour(s))  CBC     Status: Abnormal   Collection Time: 07/17/16  9:49 PM  Result Value Ref Range   WBC 19.4 (H) 3.6 - 11.0 K/uL   RBC 3.77 (L) 3.80 - 5.20 MIL/uL   Hemoglobin 10.6 (L) 12.0 - 16.0 g/dL   HCT 32.1 (L) 35.0 - 47.0 %   MCV 85.3 80.0 - 100.0 fL   MCH 28.2 26.0 - 34.0 pg   MCHC 33.0 32.0 - 36.0 g/dL   RDW 15.4 (H) 11.5 - 14.5 %   Platelets 375 150 - 440 K/uL  Basic metabolic panel     Status: Abnormal   Collection Time: 07/17/16  9:49 PM  Result Value Ref Range   Sodium 134 (L) 135 - 145 mmol/L   Potassium 4.0 3.5 - 5.1 mmol/L   Chloride 98 (L) 101 - 111 mmol/L   CO2 26 22 - 32 mmol/L   Glucose, Bld 104 (H) 65 - 99 mg/dL   BUN 17 6 - 20 mg/dL   Creatinine, Ser 0.88 0.44 - 1.00 mg/dL   Calcium 8.7 (L) 8.9 - 10.3 mg/dL   GFR calc non Af Amer >60 >60 mL/min   GFR calc Af Amer >60 >60 mL/min    Comment: (NOTE) The eGFR has been calculated using the CKD EPI equation. This calculation has not been validated in all clinical situations. eGFR's persistently <60 mL/min signify possible Chronic Kidney Disease.    Anion gap 10 5 - 15  Blood culture (routine x 2)     Status: None (Preliminary result)   Collection Time:  07/17/16  9:49 PM  Result Value Ref Range   Specimen Description BLOOD  R FA     Special Requests      BOTTLES DRAWN AEROBIC AND ANAEROBIC  AER 5 ML ANA 0 ML   Culture  Setup Time      GRAM POSITIVE COCCI AEROBIC  BOTTLE ONLY CRITICAL RESULT CALLED TO, READ BACK BY AND VERIFIED WITH: JASON ROBBINS 07/18/16 1700 KLW    Culture GRAM POSITIVE COCCI    Report Status PENDING   Sedimentation rate     Status: Abnormal   Collection Time: 07/17/16  9:49 PM  Result Value Ref Range   Sed Rate 58 (H) 0 - 30 mm/hr  APTT     Status: Abnormal   Collection Time: 07/17/16  9:49 PM  Result Value Ref Range   aPTT 45 (H) 24 - 36 seconds    Comment:        IF BASELINE aPTT IS ELEVATED, SUGGEST PATIENT RISK ASSESSMENT BE USED TO DETERMINE APPROPRIATE ANTICOAGULANT THERAPY.   Protime-INR     Status: None   Collection Time: 07/17/16  9:49 PM  Result Value Ref Range   Prothrombin Time 14.3 11.4 - 15.2 seconds   INR 1.11   C-reactive protein     Status: Abnormal   Collection Time: 07/17/16  9:50 PM  Result Value Ref Range   CRP 27.6 (H) <1.0 mg/dL    Comment: Performed at [facility]  Type and screen [place]     Status: None   Collection Time: 07/17/16  9:50 PM  Result Value Ref Range   ABO/RH(D) A POS    Antibody Screen NEG    Sample Expiration 07/20/2016   Blood culture (routine x 2)     Status: None (Preliminary result)   Collection Time: 07/17/16  9:51 PM  Result Value Ref Range   Specimen Description BLOOD  R AC     Special Requests      BOTTLES DRAWN AEROBIC AND ANAEROBIC  AER 9 ML ANA 10 ML   Culture  Setup Time      Organism ID to follow [place] CRITICAL RESULT CALLED TO, READ BACK BY AND VERIFIED WITH: KAREN HAYES @ [id number] 07/18/2016 JLJ IN BOTH AEROBIC AND ANAEROBIC BOTTLES    Culture GRAM POSITIVE COCCI    Report Status PENDING   MRSA PCR Screening     Status: None   Collection Time: 07/18/16  1:53 AM  Result  Value Ref Range   MRSA by PCR NEGATIVE NEGATIVE    Comment:        The GeneXpert MRSA Assay (FDA approved for NASAL specimens only), is one component of a comprehensive MRSA colonization surveillance program. It is not intended to diagnose MRSA infection nor to guide or monitor treatment for MRSA infections.   CBC     Status: Abnormal   Collection Time: 07/18/16  3:58 AM  Result Value Ref Range   WBC 18.6 (H) 3.6 - 11.0 K/uL   RBC 3.65 (L) 3.80 - 5.20 MIL/uL   Hemoglobin 10.4 (L) 12.0 - 16.0 g/dL   HCT 31.5 (L) 35.0 - 47.0 %   MCV 86.2 80.0 - 100.0 fL   MCH 28.6 26.0 - 34.0 pg   MCHC 33.2 32.0 - 36.0 g/dL   RDW 15.5 (H) 11.5 - 14.5 %   Platelets 348 150 - 440 K/uL  Basic metabolic panel     Status: Abnormal   Collection Time: 07/18/16  3:58 AM  Result Value Ref Range   Sodium 135 135 - 145 mmol/L   Potassium 4.1 3.5 - 5.1 mmol/L   Chloride 99 (L) 101 - 111 mmol/L   CO2 30 22 - 32 mmol/L   Glucose, Bld 103 (H) 65 - 99 mg/dL  BUN 16 6 - 20 mg/dL   Creatinine, Ser 3.16 0.44 - 1.00 mg/dL   Calcium 8.4 (L) 8.9 - 10.3 mg/dL   GFR calc non Af Amer >60 >60 mL/min   GFR calc Af Amer >60 >60 mL/min    Comment: (NOTE) The eGFR has been calculated using the CKD EPI equation. This calculation has not been validated in all clinical situations. eGFR's persistently <60 mL/min signify possible Chronic Kidney Disease.    Anion gap 6 5 - 15  Urinalysis complete, with microscopic ([facility] only)     Status: Abnormal   Collection Time: 07/18/16  4:25 AM  Result Value Ref Range   Color, Urine YELLOW (A) YELLOW   APPearance CLOUDY (A) CLEAR   Glucose, UA NEGATIVE NEGATIVE mg/dL   Bilirubin Urine NEGATIVE NEGATIVE   Ketones, ur NEGATIVE NEGATIVE mg/dL   Specific Gravity, Urine 1.015 1.005 - 1.030   Hgb urine dipstick NEGATIVE NEGATIVE   pH 6.0 5.0 - 8.0   Protein, ur NEGATIVE NEGATIVE mg/dL   Nitrite NEGATIVE NEGATIVE   Leukocytes, UA TRACE (A) NEGATIVE   RBC / HPF TOO NUMEROUS TO  COUNT 0 - 5 RBC/hpf   WBC, UA TOO NUMEROUS TO COUNT 0 - 5 WBC/hpf   Bacteria, UA RARE (A) NONE SEEN   Squamous Epithelial / LPF TOO NUMEROUS TO COUNT (A) NONE SEEN   Mucous PRESENT    No components found for: ESR, C REACTIVE PROTEIN MICRO: Recent Results (from the past 720 hour(s))  Urine culture     Status: None   Collection Time: 06/27/16  2:01 PM  Result Value Ref Range Status   Specimen Description URINE, CLEAN CATCH  Final   Special Requests NONE  Final   Culture NO GROWTH Performed at [facility]   Final   Report Status 06/28/2016 FINAL  Final  Surgical pcr screen     Status: Abnormal   Collection Time: 06/27/16  2:01 PM  Result Value Ref Range Status   MRSA, PCR NEGATIVE NEGATIVE Final   Staphylococcus aureus POSITIVE (A) NEGATIVE Final    Comment:        The Xpert SA Assay (FDA approved for NASAL specimens in patients over 66 years of age), is one component of a comprehensive surveillance program.  Test performance has been validated by [facility] for patients greater than or equal to 80 year old. It is not intended to diagnose infection nor to guide or monitor treatment.   Blood culture (routine x 2)     Status: None (Preliminary result)   Collection Time: 07/17/16  9:49 PM  Result Value Ref Range Status   Specimen Description BLOOD  R FA   Final   Special Requests   Final    BOTTLES DRAWN AEROBIC AND ANAEROBIC  AER 5 ML ANA 0 ML   Culture  Setup Time   Final    GRAM POSITIVE COCCI AEROBIC BOTTLE ONLY CRITICAL RESULT CALLED TO, READ BACK BY AND VERIFIED WITH: JASON ROBBINS 07/18/16 1700 KLW    Culture GRAM POSITIVE COCCI  Final   Report Status PENDING  Incomplete  Blood culture (routine x 2)     Status: None (Preliminary result)   Collection Time: 07/17/16  9:51 PM  Result Value Ref Range Status   Specimen Description BLOOD  R AC   Final   Special Requests   Final    BOTTLES DRAWN AEROBIC AND ANAEROBIC  AER 9 ML ANA 10 ML   Culture  Setup  Time    Final    Organism ID to follow GRAM POSITIVE COCCI ANAEROBIC BOTTLE ONLY CRITICAL RESULT CALLED TO, READ BACK BY AND VERIFIED WITH: KAREN HAYES @ [id number] 07/18/2016 JLJ IN BOTH AEROBIC AND ANAEROBIC BOTTLES    Culture GRAM POSITIVE COCCI  Final   Report Status PENDING  Incomplete  MRSA PCR Screening     Status: None   Collection Time: 07/18/16  1:53 AM  Result Value Ref Range Status   MRSA by PCR NEGATIVE NEGATIVE Final    Comment:        The GeneXpert MRSA Assay (FDA approved for NASAL specimens only), is one component of a comprehensive MRSA colonization surveillance program. It is not intended to diagnose MRSA infection nor to guide or monitor treatment for MRSA infections.     IMAGING: Dg Knee 1-2 Views Right  Result Date: 07/03/2016 CLINICAL DATA:  Status post right total knee replacement. EXAM: RIGHT KNEE - 1-2 VIEW COMPARISON:  Radiographs of April 19, 2016. FINDINGS: The femoral and tibial components appear to be well situated. No fracture or dislocation is noted. Expected postoperative findings are noted in the anterior soft tissues. IMPRESSION: Status post right total knee arthroplasty. Electronically Signed   By: Marijo Conception, M.D.   On: 07/03/2016 09:51   US Venous Img Lower Unilateral Right  Result Date: 07/17/2016 CLINICAL DATA:  Acute onset of right knee swelling. Recent right knee replacement 2 weeks ago. Initial encounter. EXAM: RIGHT LOWER EXTREMITY VENOUS DOPPLER ULTRASOUND TECHNIQUE: Gray-scale sonography with graded compression, as well as color Doppler and duplex ultrasound were performed to evaluate the lower extremity deep venous systems from the level of the common femoral vein and including the common femoral, femoral, profunda femoral, popliteal and calf veins including the posterior tibial, peroneal and gastrocnemius veins when visible. The superficial great saphenous vein was also interrogated. Spectral Doppler was utilized to evaluate flow at rest and  with distal augmentation maneuvers in the common femoral, femoral and popliteal veins. COMPARISON:  None. FINDINGS: Contralateral Common Femoral Vein: Respiratory phasicity is normal and symmetric with the symptomatic side. No evidence of thrombus. Normal compressibility. Common Femoral Vein: No evidence of thrombus. Normal compressibility, respiratory phasicity and response to augmentation. Saphenofemoral Junction: No evidence of thrombus. Normal compressibility and flow on color Doppler imaging. Profunda Femoral Vein: No evidence of thrombus. Normal compressibility and flow on color Doppler imaging. Femoral Vein: No evidence of thrombus. Normal compressibility, respiratory phasicity and response to augmentation. Popliteal Vein: No evidence of thrombus. Normal compressibility, respiratory phasicity and response to augmentation. Calf Veins: No evidence of thrombus. Normal compressibility and flow on color Doppler imaging. Superficial Great Saphenous Vein: No evidence of thrombus. Normal compressibility and flow on color Doppler imaging. Venous Reflux:  None. Other Findings:  None. IMPRESSION: No evidence of deep venous thrombosis. Electronically Signed   By: Garald Balding M.D.   On: 07/17/2016 23:14    Assessment:   [name] Douglas is a 75 y.o. female with MSSA bacteremia likely from R knee incision infection following TKR 11/14.   CRP 27, CRP 103 UA does have TNTC WBC. Given Staph aureus bacteremia will need Echo, and a min of 2 weeks IV abx Recommendations Repeat bcx ordered Echo ordered Once bcx neg for 48 hours can place picc Can dc clindamycin, cont ancef Will defer to Dr Rudene Christians whether knee needs surgical drainage.  Thank you very much for allowing me to participate in the care of this patient. Please call with questions.  Cheral Marker. Ola Spurr, MD

## 2016-07-19 NOTE — Progress Notes (Signed)
Informed Dr. Rudene Christians that we were unable to obtain IV access on the patient. Dr. Rudene Christians informed us that the patient needed a picc line for 2 weeks of IV antibiotics. Order received for picc placement.

## 2016-07-19 NOTE — Progress Notes (Signed)
Subjective:   Right knee cellulitis s/p total knee replacement Patient reports pain as moderate.   Patient is well but has infection present to the right knee Plan is to go Skilled nursing facility after hospital stay. Negative for chest pain and shortness of breath Fever: no Gastrointestinal:Negative for nausea and vomiting Positive for SOB, however this is chronic for the patient.  Objective: Vital signs in last 24 hours: Temp:  [97.6 F (36.4 C)-99.2 F (37.3 C)] 98.2 F (36.8 C) (11/30 0750) Pulse Rate:  [105-112] 111 (11/30 0750) Resp:  [17-20] 20 (11/30 0458) BP: (124-182)/(39-63) 182/63 (11/30 0750) SpO2:  [92 %-100 %] 92 % (11/30 0750)  Intake/Output from previous day:  Intake/Output Summary (Last 24 hours) at 07/19/16 0801 Last data filed at 07/19/16 0402  Gross per 24 hour  Intake             2335 ml  Output              600 ml  Net             1735 ml    Intake/Output this shift: No intake/output data recorded.  Labs:  Recent Labs  07/17/16 2149 07/18/16 0358  HGB 10.6* 10.4*    Recent Labs  07/17/16 2149 07/18/16 0358  WBC 19.4* 18.6*  RBC 3.77* 3.65*  HCT 32.1* 31.5*  PLT 375 348    Recent Labs  07/17/16 2149 07/18/16 0358  NA 134* 135  K 4.0 4.1  CL 98* 99*  CO2 26 30  BUN 17 16  CREATININE 0.88 0.84  GLUCOSE 104* 103*  CALCIUM 8.7* 8.4*    Recent Labs  07/17/16 2149  INR 1.11   EXAM General - Patient is Alert, Appropriate and Oriented Extremity - ABD soft Sensation intact distally Intact pulses distally moderate erythema to the right knee, with sero-sanguinous drainage present. Dressing/Incision - sero-sanguinous drainage Motor Function - intact, moving foot and toes well on exam.   Past Medical History:  Diagnosis Date  . Anxiety   . Arthritis    "shoulders" (08/20/2014)  . Asthma   . Chronic lower back pain   . Complication of anesthesia    "agitated & restless after knee replacement in 2008"  . COPD (chronic  obstructive pulmonary disease) ([place])   . DDD (degenerative disc disease), lumbar   . Depression   . Diverticulosis   . GERD (gastroesophageal reflux disease)   . High cholesterol   . Hypertension   . Hypothyroidism   . On home oxygen therapy    "2L at night" (08/20/2014)  . Shortness of breath dyspnea    with exertion    Assessment/Plan:    Active Problems:   Cellulitis of right knee  Estimated body mass index is 34.86 kg/m as calculated from the following:   Height as of this encounter: 5\' 6"  (1.676 m).   Weight as of this encounter: 98 kg (216 lb).  Labs reviewed, WBC 18.6 this AM, HR 111 likely due to infection. Blood Cultures grew gram positive cocci, infectious disease consult placed. Continue with Ancef and Cleocin IV for now. Can continue to work with PT today. Continue O2 therapy, can eventually wean off during day, patient on home oxygen at night.  DVT Prophylaxis - Aspirin Weight-Bearing as tolerated to right leg  J. Cameron Proud, PA-C [facility] Orthopaedic Surgery 07/19/2016, 8:01 AM

## 2016-07-20 ENCOUNTER — Inpatient Hospital Stay
Admit: 2016-07-20 | Discharge: 2016-07-20 | Disposition: A | Discharge status: Home / Self Care | Payer: Medicare Other | Attending: Infectious Diseases | Admitting: Infectious Diseases

## 2016-07-20 LAB — ECHOCARDIOGRAM COMPLETE
Height: 66 in
Weight: 3456 oz

## 2016-07-20 MED ORDER — OXYCODONE-ACETAMINOPHEN 7.5-325 MG PO TABS: 2.0000 | ORAL_TABLET | ORAL | 0 refills | Status: DC | PRN

## 2016-07-20 MED ORDER — RIFAMPIN 300 MG PO CAPS
600.0000 mg | ORAL_CAPSULE | Freq: Every day | ORAL | Status: DC
  Administered 2016-07-20 – 2016-07-22 (×3): 600 mg via ORAL
  Filled 2016-07-20 (×3): qty 2

## 2016-07-20 NOTE — Discharge Summary (Addendum)
Physician Discharge Summary  Patient ID: YOSELIN PEED MRN: [id number] DOB/AGE: 23-Oct-1940 75 y.o.  Admit date: 07/17/2016 Discharge date: 07/20/2016 patient was discharged on 07/22/2016. Was not discharged on 07/20/2016 secondary to medical issues including hypoxemia as well as waiting on sensitivity to place patient on antibiotics.  Consultations;  Dr. Ola Spurr; infectious disease  Admission Diagnoses:  Cellulitis of right knee [id number] Postoperative surgical complication involving musculoskeletal system associated with musculoskeletal procedure, unspecified complication [id number]  Discharge Diagnoses: Patient Active Problem List   Diagnosis Date Noted  . Cellulitis of right knee 07/17/2016  . Primary localized osteoarthritis of right knee 07/03/2016  . Ileus ([place]) 04/24/2016  . Generalized abdominal pain   . Colitis due to Clostridium difficile   . Recurrent Clostridium difficile diarrhea   . Enteritis due to Clostridium difficile 08/22/2014  . Diarrhea 08/20/2014  . HTN (hypertension) 08/20/2014  . HLD (hyperlipidemia) 08/20/2014  . Hypothyroidism 08/20/2014  . Tobacco abuse 08/20/2014  . COPD (chronic obstructive pulmonary disease) ([place]) 08/20/2014  . Back pain 08/20/2014    Past Medical History:  Diagnosis Date  . Anxiety   . Arthritis    "shoulders" (08/20/2014)  . Asthma   . Chronic lower back pain   . Complication of anesthesia    "agitated & restless after knee replacement in 2008"  . COPD (chronic obstructive pulmonary disease) ([place])   . DDD (degenerative disc disease), lumbar   . Depression   . Diverticulosis   . GERD (gastroesophageal reflux disease)   . High cholesterol   . Hypertension   . Hypothyroidism   . On home oxygen therapy    "2L at night" (08/20/2014)  . Shortness of breath dyspnea    with exertion     Transfusion: None   Consultants (if any): Treatment Team:  Leonel Ramsay, MD Ebbie Ridge, MD  Discharged Condition:  Improved  Hospital Course: [name] Douglas is an 75 y.o. female who was admitted 07/17/2016 with a diagnosis of cellulitis of the right knee.  Blood cultures were positive for Staph aureus.  PICC Line was placed and a consult to infectious disease was ordered.  Will need two weeks of IV antibiotics with continued Ancef   Surgeries:  Previous right total knee replacement.  Continued ASA 325mg  daily for DVT prophylaxis. Foot pumps applied bilaterally at 80 mm. Heels elevated on bed with rolled towels. No evidence of DVT. Negative Homan. Physical therapy started for gait training and transfer. OT started day #1 for ADL and assisted devices.  She was given the following antibiotics:  Anti-infectives    Start     Dose/Rate Route Frequency Ordered Stop   07/19/16 1600  vancomycin (VANCOCIN) IVPB 1000 mg/200 mL premix  Status:  Discontinued     1,000 mg 200 mL/hr over 60 Minutes Intravenous Every 12 hours 07/19/16 0850 07/19/16 0915   07/19/16 0900  vancomycin (VANCOCIN) IVPB 1000 mg/200 mL premix  Status:  Discontinued     1,000 mg 200 mL/hr over 60 Minutes Intravenous  Once 07/19/16 0850 07/19/16 0915   07/19/16 0000  ceFAZolin (ANCEF) IVPB 2g/100 mL premix     2 g 200 mL/hr over 30 Minutes Intravenous Every 8 hours 07/18/16 1636     07/18/16 0000  ceFAZolin (ANCEF) IVPB 1 g/50 mL premix  Status:  Discontinued     1 g 100 mL/hr over 30 Minutes Intravenous Every 8 hours 07/17/16 2208 07/18/16 1635   07/18/16 0000  clindamycin (CLEOCIN) IVPB 600 mg  Status:  Discontinued     600 mg 100 mL/hr over 30 Minutes Intravenous Every 8 hours 07/17/16 2208 07/19/16 0915    .  She was given sequential compression devices, early ambulation, and ASA for DVT prophylaxis.  She benefited maximally from the hospital stay.   Recent vital signs:  Vitals:   07/20/16 0446 07/20/16 0839  BP: (!) 137/40 (!) 140/43  Pulse: 100 (!) 105  Resp: 16 19  Temp: 98.5 F (36.9 C) 98.1 F (36.7 C)    Recent  laboratory studies:  Lab Results  Component Value Date   HGB 10.4 (L) 07/18/2016   HGB 10.6 (L) 07/17/2016   HGB 10.9 (L) 07/04/2016   Lab Results  Component Value Date   WBC 18.6 (H) 07/18/2016   PLT 348 07/18/2016   Lab Results  Component Value Date   INR 1.11 07/17/2016   Lab Results  Component Value Date   NA 135 07/18/2016   K 4.1 07/18/2016   CL 99 (L) 07/18/2016   CO2 30 07/18/2016   BUN 16 07/18/2016   CREATININE 0.84 07/18/2016   GLUCOSE 103 (H) 07/18/2016    Discharge Medications:     Medication List    TAKE these medications   albuterol 108 (90 Base) MCG/ACT inhaler Commonly known as:  PROVENTIL HFA;VENTOLIN HFA Inhale 2 puffs into the lungs every 6 (six) hours as needed for wheezing or shortness of breath. What changed:  Another medication with the same name was changed. Make sure you understand how and when to take each.   albuterol (2.5 MG/3ML) 0.083% nebulizer solution Commonly known as:  PROVENTIL Take 3 mLs (2.5 mg total) by nebulization 2 (two) times daily. What changed:  when to take this  reasons to take this   atorvastatin 10 MG tablet Commonly known as:  LIPITOR Take 10 mg by mouth every evening.   calcium-vitamin D 500-200 MG-UNIT tablet Take 1 tablet by mouth daily.   celecoxib 200 MG capsule Commonly known as:  CELEBREX Take 200 mg by mouth daily.   cyclobenzaprine 5 MG tablet Commonly known as:  FLEXERIL Take 5 mg by mouth 2 (two) times daily as needed for muscle spasms.   enoxaparin 30 MG/0.3ML injection Commonly known as:  LOVENOX Inject 0.3 mLs (30 mg total) into the skin every 12 (twelve) hours.   gabapentin 100 MG capsule Commonly known as:  NEURONTIN Take 100 mg by mouth 3 (three) times daily.   HYDROcodone-acetaminophen 10-325 MG tablet Commonly known as:  NORCO Take 1-2 tablets by mouth every 4 (four) hours as needed for moderate pain.   levothyroxine 88 MCG tablet Commonly known as:  SYNTHROID,  LEVOTHROID Take 88 mcg by mouth daily before breakfast.   metoprolol 50 MG tablet Commonly known as:  LOPRESSOR Take 50 mg by mouth every evening.   oxyCODONE-acetaminophen 7.5-325 MG tablet Commonly known as:  PERCOCET Take 2 tablets by mouth every 4 (four) hours as needed for moderate pain.   traMADol 50 MG tablet Commonly known as:  ULTRAM Take 2 tablets (100 mg total) by mouth every 6 (six) hours.   venlafaxine XR 75 MG 24 hr capsule Commonly known as:  EFFEXOR-XR Take 3 capsules (225 mg total) by mouth daily with breakfast.       Diagnostic Studies: Dg Knee 1-2 Views Right  Result Date: 07/03/2016 CLINICAL DATA:  Status post right total knee replacement. EXAM: RIGHT KNEE - 1-2 VIEW COMPARISON:  Radiographs of April 19, 2016. FINDINGS: The femoral and tibial components  appear to be well situated. No fracture or dislocation is noted. Expected postoperative findings are noted in the anterior soft tissues. IMPRESSION: Status post right total knee arthroplasty. Electronically Signed   By: Marijo Conception, M.D.   On: 07/03/2016 09:51   US Venous Img Lower Unilateral Right  Result Date: 07/17/2016 CLINICAL DATA:  Acute onset of right knee swelling. Recent right knee replacement 2 weeks ago. Initial encounter. EXAM: RIGHT LOWER EXTREMITY VENOUS DOPPLER ULTRASOUND TECHNIQUE: Gray-scale sonography with graded compression, as well as color Doppler and duplex ultrasound were performed to evaluate the lower extremity deep venous systems from the level of the common femoral vein and including the common femoral, femoral, profunda femoral, popliteal and calf veins including the posterior tibial, peroneal and gastrocnemius veins when visible. The superficial great saphenous vein was also interrogated. Spectral Doppler was utilized to evaluate flow at rest and with distal augmentation maneuvers in the common femoral, femoral and popliteal veins. COMPARISON:  None. FINDINGS: Contralateral Common  Femoral Vein: Respiratory phasicity is normal and symmetric with the symptomatic side. No evidence of thrombus. Normal compressibility. Common Femoral Vein: No evidence of thrombus. Normal compressibility, respiratory phasicity and response to augmentation. Saphenofemoral Junction: No evidence of thrombus. Normal compressibility and flow on color Doppler imaging. Profunda Femoral Vein: No evidence of thrombus. Normal compressibility and flow on color Doppler imaging. Femoral Vein: No evidence of thrombus. Normal compressibility, respiratory phasicity and response to augmentation. Popliteal Vein: No evidence of thrombus. Normal compressibility, respiratory phasicity and response to augmentation. Calf Veins: No evidence of thrombus. Normal compressibility and flow on color Doppler imaging. Superficial Great Saphenous Vein: No evidence of thrombus. Normal compressibility and flow on color Doppler imaging. Venous Reflux:  None. Other Findings:  None. IMPRESSION: No evidence of deep venous thrombosis. Electronically Signed   By: Garald Balding M.D.   On: 07/17/2016 23:14   Dg Chest Port 1 View  Result Date: 07/19/2016 CLINICAL DATA:  PICC line placement EXAM: PORTABLE CHEST 1 VIEW COMPARISON:  04/23/2016 FINDINGS: AP erect portable view of the chest demonstrates mild elevation of the right diaphragm. Streaky bibasilar left greater than right opacities could reflect atelectasis, a mild infiltrate at the left base cannot be ruled out. Possible tiny right pleural effusion. There is borderline cardiomegaly. A right upper extremity catheter tip overlies the SVC. IMPRESSION: 1. Right upper extremity catheter tip projects over the SVC. 2. Mild elevation of right diaphragm. Streaky bibasilar left greater than right opacities suggestive of atelectasis or possible mild left basilar infiltrate. Possible tiny right pleural effusion. 3. Borderline cardiomegaly. Electronically Signed   By: Donavan Foil M.D.   On: 07/19/2016  18:36    Disposition: Plan will be for discharge to SNF over the weekend once it is determined that she does not need an I&D of the right knee.  Continue IV antibiotics at this time.   Contact information for follow-up providers    FITZGERALD, DAVID P, MD. Schedule an appointment as soon as possible for a visit.   Specialty:  Infectious Diseases Contact information: [address] [facility] [place] [address] [phone]        MENZ,MICHAEL, MD. Schedule an appointment as soon as possible for a visit.   Specialty:  Orthopedic Surgery Contact information: [place] [address] [phone]            Contact information for after-discharge care    Destination    [facility] Follow up.   Specialty:  [place] information: [address] [place] [phone]                 Signed: Judson Roch PA-C 07/20/2016, 11:45 AM

## 2016-07-20 NOTE — Progress Notes (Addendum)
Clinical Education officer, museum (CSW) received a call from patient's daughter Larene Beach this morning stating that patient has changed her mind and does not want to return to [facility] and wants to go to [facility]. Per [facility] liaision they can accept patient. Larene Beach is aware of above. Plan is for patient to go to [facility] when stable for D/C. CSW will continue to follow and assist as needed.   Plan is for patient to D/C to [facility] room 806 over the weekend if stable. CSW sent IV Abx orders to [facility] today. CSW also sent D/C orders to [facility] today. Patient is aware of above. [facility] liaision is aware of above.   [employer], LCSW [phone]

## 2016-07-20 NOTE — Progress Notes (Signed)
Physical Therapy Treatment Patient Details Name: [name] Douglas MRN: [id number] DOB: 13-May-1941 Today's Date: 07/20/2016    History of Present Illness Pt is a 75 y/o F s/p R TKA by Dr. Rudene Christians ~2 wks prior.  Pt noticed redness and drainage from the incision.  Pt was admitted for IV antibiotics.  Pt's PMH includes anxiety, chronic low back pain, COPD, DDD lumbar, 2L O2 at night, L TKA.    PT Comments    Pt in chair ready to return to bed.  Stood with min a x 1 and transferred back to bed with min a x 1 and overall improving safety and mobility.  Gait remains limited by pain and decreased ability to tolerate weight bearing "I can only put the ball of my foot down".  Participated in exercises as described below.  ROM and ability to tolerate exercises in improving daily.     Follow Up Recommendations  SNF     Equipment Recommendations       Recommendations for Other Services       Precautions / Restrictions Precautions Precautions: Fall Restrictions Weight Bearing Restrictions: Yes RLE Weight Bearing: Weight bearing as tolerated    Mobility  Bed Mobility Overal bed mobility: Needs Assistance Bed Mobility: Supine to Sit     Supine to sit: Min assist Sit to supine: Min guard   General bed mobility comments: improviong daily  Transfers Overall transfer level: Needs assistance Equipment used: Rolling walker (2 wheeled) Transfers: Sit to/from Stand Sit to Stand: Min assist Stand pivot transfers: Min assist       General transfer comment: improved safety this pm  Ambulation/Gait Ambulation/Gait assistance: Min assist Ambulation Distance (Feet): 3 Feet Assistive device: Rolling walker (2 wheeled) Gait Pattern/deviations: Step-to pattern   Gait velocity interpretation: <1.8 ft/sec, indicative of risk for recurrent falls General Gait Details: minimal weight bearing on rle during steps   Stairs            Wheelchair Mobility    Modified Rankin (Stroke Patients  Only)       Balance Overall balance assessment: Needs assistance Sitting-balance support: Feet supported Sitting balance-Leahy Scale: Good     Standing balance support: Bilateral upper extremity supported Standing balance-Leahy Scale: Fair                      Cognition Arousal/Alertness: Awake/alert Behavior During Therapy: WFL for tasks assessed/performed Overall Cognitive Status: Within Functional Limits for tasks assessed                      Exercises Total Joint Exercises Ankle Circles/Pumps: AROM;Both;10 reps;Supine Quad Sets: Strengthening;Both;10 reps;Supine Gluteal Sets: AROM;Both;10 reps;Supine Short Arc Quad: AAROM;Right;10 reps;Supine Heel Slides: AAROM;Right;10 reps;Supine Hip ABduction/ADduction: AAROM;Right;10 reps;Supine Straight Leg Raises: AAROM;Right;10 reps;Supine Long Arc Quad: AAROM;Right;20 reps;Seated Knee Flexion: AAROM;Right;10 reps;Seated Goniometric ROM: 5-70 Other Exercises Other Exercises: to bedside commode to void    General Comments        Pertinent Vitals/Pain Pain Assessment: 0-10 Pain Score: 7  Pain Location: R knee Pain Descriptors / Indicators: Sore;Grimacing Pain Intervention(s): Limited activity within patient's tolerance    Home Living                      Prior Function            PT Goals (current goals can now be found in the care plan section) Acute Rehab PT Goals Patient Stated Goal: feeling better Progress towards PT  goals: Progressing toward goals    Frequency    BID      PT Plan Current plan remains appropriate    Co-evaluation             End of Session Equipment Utilized During Treatment: Oxygen Activity Tolerance: Patient limited by pain Patient left: in bed;with call bell/phone within reach;with bed alarm set     Time: [id number] PT Time Calculation (min) (ACUTE ONLY): 18 min  Charges:  $Therapeutic Exercise: 8-22 mins                    G Codes:       Chesley Noon August 01, 2016, 2:33 PM

## 2016-07-20 NOTE — Progress Notes (Signed)
[place] INFECTIOUS DISEASE PROGRESS NOTE Date of Admission:  07/17/2016     ID: [name] Douglas is a 75 y.o. female with infection at site of TKR Active Problems:   Cellulitis of right knee   Subjective: No fevers, much  less pain   ROS  Eleven systems are reviewed and negative except per hpi  Medications:  Antibiotics Given (last 72 hours)    Date/Time Action Medication Dose Rate   07/18/16 0017 Given   ceFAZolin (ANCEF) IVPB 1 g/50 mL premix 1 g 100 mL/hr   07/18/16 0017 Given   clindamycin (CLEOCIN) IVPB 600 mg 600 mg 100 mL/hr   07/18/16 0749 Given   clindamycin (CLEOCIN) IVPB 600 mg 600 mg 100 mL/hr   07/18/16 0849 Given   ceFAZolin (ANCEF) IVPB 1 g/50 mL premix 1 g 100 mL/hr   07/18/16 1627 Given   ceFAZolin (ANCEF) IVPB 1 g/50 mL premix 1 g 100 mL/hr   07/18/16 1748 Given   clindamycin (CLEOCIN) IVPB 600 mg 600 mg 100 mL/hr   07/18/16 2331 Given   ceFAZolin (ANCEF) IVPB 2g/100 mL premix 2 g 200 mL/hr   07/19/16 0022 Given   clindamycin (CLEOCIN) IVPB 600 mg 600 mg 100 mL/hr   07/19/16 0544 Given   ceFAZolin (ANCEF) IVPB 2g/100 mL premix 2 g 200 mL/hr   07/19/16 0835 Given   clindamycin (CLEOCIN) IVPB 600 mg 600 mg 100 mL/hr   07/19/16 2045 Given   ceFAZolin (ANCEF) IVPB 2g/100 mL premix 2 g 200 mL/hr   07/20/16 0504 Given   ceFAZolin (ANCEF) IVPB 2g/100 mL premix 2 g 200 mL/hr   07/20/16 1424 Given   ceFAZolin (ANCEF) IVPB 2g/100 mL premix 2 g 200 mL/hr     . aspirin EC  325 mg Oral Daily  .  ceFAZolin (ANCEF) IV  2 g Intravenous Q8H  . docusate sodium  100 mg Oral BID  . oxyCODONE  15 mg Oral Q12H  . traMADol  100 mg Oral Q6H    Objective: Vital signs in last 24 hours: Temp:  [98.1 F (36.7 C)-98.6 F (37 C)] 98.3 F (36.8 C) (12/01 1200) Pulse Rate:  [68-105] 68 (12/01 1200) Resp:  [16-19] 16 (12/01 1200) BP: (137-155)/(39-55) 139/39 (12/01 1200) SpO2:  [89 %-96 %] 89 % (12/01 1200) Constitutional:  oriented to person, place, and time.  appears well-developed and well-nourished. No distress.  HENT: [place]/AT, PERRLA, no scleral icterus Mouth/Throat: Oropharynx is clear and moist. No oropharyngeal exudate.  Cardiovascular: Normal rate, regular rhythm and normal heart sounds.2/6 SM Pulmonary/Chest: Effort normal and breath sounds normal. No respiratory distress.  has no wheezes.  Neck = supple, no nuchal rigidity Abdominal: Soft. Bowel sounds are normal.  exhibits no distension. There is no tenderness.  Ext R knee with aproxiamted incision, but sign warmth and swelling . Mod pain with movement, less ttp Lymphadenopathy: no cervical adenopathy. No axillary adenopathy Neurological: alert and oriented to person, place, and time.  Skin: erythema around R knee anterior Psychiatric: a normal mood and affect.  behavior is normal.   Lab Results  Recent Labs  07/17/16 2149 07/18/16 0358  WBC 19.4* 18.6*  HGB 10.6* 10.4*  HCT 32.1* 31.5*  NA 134* 135  K 4.0 4.1  CL 98* 99*  CO2 26 30  BUN 17 16  CREATININE 0.88 0.84    Microbiology: Results for orders placed or performed during the hospital encounter of 07/17/16  Blood culture (routine x 2)     Status: Abnormal (  Preliminary result)   Collection Time: 07/17/16  9:49 PM  Result Value Ref Range Status   Specimen Description BLOOD  R FA   Final   Special Requests   Final    BOTTLES DRAWN AEROBIC AND ANAEROBIC  AER 5 ML ANA 0 ML   Culture  Setup Time   Final    GRAM POSITIVE COCCI AEROBIC BOTTLE ONLY CRITICAL RESULT CALLED TO, READ BACK BY AND VERIFIED WITH: JASON ROBBINS 07/18/16 1700 KLW    Culture STAPHYLOCOCCUS AUREUS (A)  Final   Report Status PENDING  Incomplete  Blood culture (routine x 2)     Status: Abnormal (Preliminary result)   Collection Time: 07/17/16  9:51 PM  Result Value Ref Range Status   Specimen Description BLOOD  R AC   Final   Special Requests   Final    BOTTLES DRAWN AEROBIC AND ANAEROBIC  AER 9 ML ANA 10 ML   Culture  Setup Time   Final     Organism ID to follow GRAM POSITIVE COCCI ANAEROBIC BOTTLE ONLY CRITICAL RESULT CALLED TO, READ BACK BY AND VERIFIED WITH: KAREN HAYES @ [id number] 07/18/2016 JLJ IN BOTH AEROBIC AND ANAEROBIC BOTTLES    Culture (A)  Final    STAPHYLOCOCCUS AUREUS SUSCEPTIBILITIES TO FOLLOW Performed at [facility]    Report Status PENDING  Incomplete  Blood Culture ID Panel (Reflexed)     Status: Abnormal   Collection Time: 07/17/16  9:51 PM  Result Value Ref Range Status   Enterococcus species NOT DETECTED NOT DETECTED Final   Listeria monocytogenes NOT DETECTED NOT DETECTED Final   Staphylococcus species DETECTED (A) NOT DETECTED Final    Comment: CRITICAL RESULT CALLED TO, READ BACK BY AND VERIFIED WITH: KAREN HAYES @ 1515 07/18/2016 JLJ    Staphylococcus aureus DETECTED (A) NOT DETECTED Final    Comment: CRITICAL RESULT CALLED TO, READ BACK BY AND VERIFIED WITH: KAREN HAYES @ [id number] 07/18/2016 JLJ    Methicillin resistance NOT DETECTED NOT DETECTED Final   Streptococcus species NOT DETECTED NOT DETECTED Final   Streptococcus agalactiae NOT DETECTED NOT DETECTED Final   Streptococcus pneumoniae NOT DETECTED NOT DETECTED Final   Streptococcus pyogenes NOT DETECTED NOT DETECTED Final   Acinetobacter baumannii NOT DETECTED NOT DETECTED Final   Enterobacteriaceae species NOT DETECTED NOT DETECTED Final   Enterobacter cloacae complex NOT DETECTED NOT DETECTED Final   Escherichia coli NOT DETECTED NOT DETECTED Final   Klebsiella oxytoca NOT DETECTED NOT DETECTED Final   Klebsiella pneumoniae NOT DETECTED NOT DETECTED Final   Proteus species NOT DETECTED NOT DETECTED Final   Serratia marcescens NOT DETECTED NOT DETECTED Final   Haemophilus influenzae NOT DETECTED NOT DETECTED Final   Neisseria meningitidis NOT DETECTED NOT DETECTED Final   Pseudomonas aeruginosa NOT DETECTED NOT DETECTED Final   Candida albicans NOT DETECTED NOT DETECTED Final   Candida glabrata NOT DETECTED NOT DETECTED Final    Candida krusei NOT DETECTED NOT DETECTED Final   Candida parapsilosis NOT DETECTED NOT DETECTED Final   Candida tropicalis NOT DETECTED NOT DETECTED Final  MRSA PCR Screening     Status: None   Collection Time: 07/18/16  1:53 AM  Result Value Ref Range Status   MRSA by PCR NEGATIVE NEGATIVE Final    Comment:        The GeneXpert MRSA Assay (FDA approved for NASAL specimens only), is one component of a comprehensive MRSA colonization surveillance program. It is not intended to diagnose MRSA infection nor to  guide or monitor treatment for MRSA infections.   Urine culture     Status: Abnormal   Collection Time: 07/18/16  4:25 AM  Result Value Ref Range Status   Specimen Description URINE, RANDOM  Final   Special Requests NONE  Final   Culture MULTIPLE SPECIES PRESENT, SUGGEST RECOLLECTION (A)  Final   Report Status 07/19/2016 FINAL  Final  CULTURE, BLOOD (ROUTINE X 2) w Reflex to ID Panel     Status: None (Preliminary result)   Collection Time: 07/19/16 10:25 AM  Result Value Ref Range Status   Specimen Description BLOOD LEFT AC  Final   Special Requests   Final    BOTTLES DRAWN AEROBIC AND ANAEROBIC ANA 12ML AER 11ML   Culture NO GROWTH < 24 HOURS  Final   Report Status PENDING  Incomplete  CULTURE, BLOOD (ROUTINE X 2) w Reflex to ID Panel     Status: None (Preliminary result)   Collection Time: 07/19/16  2:22 PM  Result Value Ref Range Status   Specimen Description BLOOD LEFT FATTY CASTS  Final   Special Requests   Final    BOTTLES DRAWN AEROBIC AND ANAEROBIC 10MLAERO,11MLANA   Culture NO GROWTH < 24 HOURS  Final   Report Status PENDING  Incomplete    Studies/Results: Dg Chest Port 1 View  Result Date: 07/19/2016 CLINICAL DATA:  PICC line placement EXAM: PORTABLE CHEST 1 VIEW COMPARISON:  04/23/2016 FINDINGS: AP erect portable view of the chest demonstrates mild elevation of the right diaphragm. Streaky bibasilar left greater than right opacities could reflect  atelectasis, a mild infiltrate at the left base cannot be ruled out. Possible tiny right pleural effusion. There is borderline cardiomegaly. A right upper extremity catheter tip overlies the SVC. IMPRESSION: 1. Right upper extremity catheter tip projects over the SVC. 2. Mild elevation of right diaphragm. Streaky bibasilar left greater than right opacities suggestive of atelectasis or possible mild left basilar infiltrate. Possible tiny right pleural effusion. 3. Borderline cardiomegaly. Electronically Signed   By: Donavan Foil M.D.   On: 07/19/2016 18:36   07/19/16 Echo Study Conclusions  - Left ventricle: The cavity size was normal. Systolic function was   normal. The estimated ejection fraction was 60%. Doppler   parameters are consistent with abnormal left ventricular   relaxation (grade 1 diastolic dysfunction). - Aortic valve: There was mild regurgitation. Valve area (VTI):   1.76 cm^2. Valve area (Vmax): 1.49 cm^2. Valve area (Vmean): 1.57   cm^2. - Mitral valve: There was mild regurgitation. - Right ventricle: The cavity size was mildly dilated. - Right atrium: The atrium was mildly dilated. - Atrial septum: No defect or patent foramen ovale was identified. - Pulmonary arteries: PA peak pressure: 50 mm Hg (S).  Impressions:  - The right ventricular systolic pressure was increased consistent   with severe pulmonary hypertension. Normal left ventricular   systolic function with mild diastolic dysfunction and febrile   calcified aortic valve with mild mitral regurgitation and mild   tricuspid regurgitation. No vegetation seen.  Assessment/Plan: METTA PINGREE is a 75 y.o. female with MSSA bacteremia likely from R knee incision infection following TKR 11/14.   CRP 27, CRP 103 UA does have TNTC WBC. Echo neg for vegetation, FU bcx 11/30 ngtd Picc placed early due to loss of IV access  Recommendations Continue ancef for 2 week minimum Will add rifampin for now May decide on  longer IV treatment depending on clinical response Spoke with Dr Rudene Christians who thinks knee  does not need surgical drainage but will monitor closely.  Thank you very much for the consult. Will follow with you.  [place], Jerzie Bieri P   07/20/2016, 2:31 PM

## 2016-07-20 NOTE — Discharge Instructions (Signed)
-  Continue IV antibiotics -Dressing changes as needed. -Follow-up with both Infectious disease as well as Orthopaedics upon discharge.

## 2016-07-20 NOTE — Progress Notes (Signed)
[place] rounding the unit visited Pt. Pt was watching TV, and was good spirit. [place] talked to Pt and informed her about the chaplain services in the unit. Pt requested for prayers, which the [facility], provided, and informed Pt, he was available if needed.     07/20/16 1600  Clinical Encounter Type  Visited With Patient  Visit Type Initial;Spiritual support  Referral From Nurse  Consult/Referral To Chaplain  Spiritual Encounters  Spiritual Needs Prayer

## 2016-07-20 NOTE — Progress Notes (Signed)
Infectious Disease Long Term IV Antibiotic Orders  Diagnosis: MSSA bacteremia, R TKR post op infeciton   Culture results MSSA BCX  11/30 bcx neg Lab Results  Component Value Date   ESRSEDRATE 58 (H) 07/17/2016   Lab Results  Component Value Date   CRP 27.6 (H) 07/17/2016    Allergies: No Known Allergies  Discharge antibiotics Cefazolin      2 grams every  8 hours  Oral rifampin 600 qd x 2 weeks  PICC Care per protocol Labs weekly while on IV antibiotics      CBC w diff   Comprehensive met panel CRP  ESR  Planned duration of antibiotics 2 weeks from 11/30  Stop date 12/14  Follow up clinic date Within 2 weeks     FAX weekly labs to [phone]  Leonel Ramsay, MD

## 2016-07-20 NOTE — Progress Notes (Signed)
Subjective:   Right knee cellulitis s/p total knee replacement and bacteremia. Patient reports pain as moderate.   Patient is well but has infection present to the right knee as well as UTI. Plan is to go Skilled nursing facility after hospital stay. Negative for chest pain and shortness of breath Fever: no Gastrointestinal:Negative for nausea and vomiting Positive for SOB, however this is chronic for the patient.  Objective: Vital signs in last 24 hours: Temp:  [98.1 F (36.7 C)-98.6 F (37 C)] 98.1 F (36.7 C) (12/01 0839) Pulse Rate:  [100-111] 105 (12/01 0839) Resp:  [16-19] 19 (12/01 0839) BP: (113-155)/(32-65) 140/43 (12/01 0839) SpO2:  [96 %-98 %] 96 % (12/01 0839)  Intake/Output from previous day:  Intake/Output Summary (Last 24 hours) at 07/20/16 1136 Last data filed at 07/20/16 1016  Gross per 24 hour  Intake              465 ml  Output              900 ml  Net             -435 ml    Intake/Output this shift: Total I/O In: 240 [P.O.:240] Out: 900 [Urine:900]  Labs:  Recent Labs  07/17/16 2149 07/18/16 0358  HGB 10.6* 10.4*    Recent Labs  07/17/16 2149 07/18/16 0358  WBC 19.4* 18.6*  RBC 3.77* 3.65*  HCT 32.1* 31.5*  PLT 375 348    Recent Labs  07/17/16 2149 07/18/16 0358  NA 134* 135  K 4.0 4.1  CL 98* 99*  CO2 26 30  BUN 17 16  CREATININE 0.88 0.84  GLUCOSE 104* 103*  CALCIUM 8.7* 8.4*    Recent Labs  07/17/16 2149  INR 1.11   EXAM General - Patient is Alert, Appropriate and Oriented Extremity - ABD soft Sensation intact distally Intact pulses distally erythema improved to the right knee, with sero-sanguinous drainage present. Dressing/Incision - sero-sanguinous drainage Motor Function - intact, moving foot and toes well on exam.   Past Medical History:  Diagnosis Date  . Anxiety   . Arthritis    "shoulders" (08/20/2014)  . Asthma   . Chronic lower back pain   . Complication of anesthesia    "agitated & restless after  knee replacement in 2008"  . COPD (chronic obstructive pulmonary disease) ([place])   . DDD (degenerative disc disease), lumbar   . Depression   . Diverticulosis   . GERD (gastroesophageal reflux disease)   . High cholesterol   . Hypertension   . Hypothyroidism   . On home oxygen therapy    "2L at night" (08/20/2014)  . Shortness of breath dyspnea    with exertion    Assessment/Plan:    Active Problems:   Cellulitis of right knee  Estimated body mass index is 34.86 kg/m as calculated from the following:   Height as of this encounter: 5\' 6"  (1.676 m).   Weight as of this encounter: 98 kg (216 lb).  No temp today, HR 105, BP 140/43 Blood Cultures grew gram positive cocci, infectious disease consult. PICC Line placed with need two weeks of Abx therapy.  Continue working with PT. Can continue to work with PT today. Continue O2 therapy, can eventually wean off during day, patient on home oxygen at night. Plan will be for discharge to SNF over the weekend.  DVT Prophylaxis - Aspirin Weight-Bearing as tolerated to right leg  J. Cameron Proud, PA-C [facility] Orthopaedic Surgery 07/20/2016, 11:36  AM

## 2016-07-20 NOTE — Progress Notes (Signed)
Physical Therapy Treatment Patient Details Name: [name] Douglas MRN: [id number] DOB: 1941/06/15 Today's Date: 07/20/2016    History of Present Illness Pt is a 75 y/o F s/p R TKA by Dr. Rudene Christians ~2 wks prior.  Pt noticed redness and drainage from the incision.  Pt was admitted for IV antibiotics.  Pt's PMH includes anxiety, chronic low back pain, COPD, DDD lumbar, 2L O2 at night, L TKA.    PT Comments    Pt in bed agrees to session requesting to use bathroom.  Bed mobility improve today requiring only min a.  Stood with min a x 2 for safety as she hurried to bedside commode with overall poor safety and hand placement trying to sit before fully turned.  Educated on safety.  Pt stated she needed to void quickly.  Safety stressed.  180 degree transfer to recliner was much safer but she continues with minimal weight bearing on RLE.  KI used.  Ambulation deferred due to pain.  Participated in exercises as described below.  ROM improved today 5-70 in sitting.     Follow Up Recommendations  SNF     Equipment Recommendations       Recommendations for Other Services       Precautions / Restrictions Precautions Precautions: Fall Restrictions Weight Bearing Restrictions: Yes RLE Weight Bearing: Weight bearing as tolerated    Mobility  Bed Mobility Overal bed mobility: Needs Assistance Bed Mobility: Supine to Sit     Supine to sit: Min assist     General bed mobility comments: improviong daily  Transfers Overall transfer level: Needs assistance Equipment used: Rolling walker (2 wheeled) Transfers: Sit to/from Stand Sit to Stand: Min assist;+2 safety/equipment Stand pivot transfers: Min assist;+2 safety/equipment       General transfer comment: hurried to bedside commode with poor safety but did well returning to recliner  Ambulation/Gait Ambulation/Gait assistance: Min assist;+2 safety/equipment Ambulation Distance (Feet): 3 Feet Assistive device: Rolling walker (2  wheeled) Gait Pattern/deviations: Step-to pattern   Gait velocity interpretation: <1.8 ft/sec, indicative of risk for recurrent falls General Gait Details: minimal weight bearing on rle during steps   Stairs            Wheelchair Mobility    Modified Rankin (Stroke Patients Only)       Balance Overall balance assessment: Needs assistance Sitting-balance support: Feet supported Sitting balance-Leahy Scale: Good     Standing balance support: Bilateral upper extremity supported Standing balance-Leahy Scale: Fair                      Cognition Arousal/Alertness: Awake/alert Behavior During Therapy: WFL for tasks assessed/performed Overall Cognitive Status: Within Functional Limits for tasks assessed                      Exercises Total Joint Exercises Ankle Circles/Pumps: AROM;Both;10 reps;Supine Quad Sets: Strengthening;Both;10 reps;Supine Gluteal Sets: AROM;Both;10 reps;Supine Short Arc Quad: AAROM;Right;10 reps;Supine Heel Slides: AAROM;Right;10 reps;Supine Hip ABduction/ADduction: AAROM;Right;10 reps;Supine Straight Leg Raises: AAROM;Right;10 reps;Supine Long Arc Quad: AAROM;Right;20 reps;Seated Knee Flexion: AAROM;Right;10 reps;Seated Goniometric ROM: 5-70 Other Exercises Other Exercises: to bedside commode to void    General Comments        Pertinent Vitals/Pain Pain Assessment: 0-10 Pain Score: 7  Pain Location: R knee Pain Descriptors / Indicators: Sore;Grimacing Pain Intervention(s): Limited activity within patient's tolerance    Home Living  Prior Function            PT Goals (current goals can now be found in the care plan section) Acute Rehab PT Goals Patient Stated Goal: feeling better Progress towards PT goals: Progressing toward goals    Frequency    BID      PT Plan Current plan remains appropriate    Co-evaluation             End of Session Equipment Utilized During  Treatment: Oxygen Activity Tolerance: Patient limited by pain Patient left: in chair;with call bell/phone within reach;with chair alarm set     Time: [id number] PT Time Calculation (min) (ACUTE ONLY): 35 min  Charges:                       G Codes:      Chesley Noon 2016/08/06, 11:25 AM

## 2016-07-20 NOTE — Progress Notes (Signed)
*  PRELIMINARY RESULTS* Echocardiogram 2D Echocardiogram has been performed.  Sherrie Sport 07/20/2016, 7:57 AM

## 2016-07-20 NOTE — Progress Notes (Signed)
Physical Therapy Treatment Patient Details Name: CLAUDINE AKOPYAN MRN: [id number] DOB: September 12, 1940 Today's Date: 08-15-16   LATE ENTRY for 11/30 16:00  History of Present Illness Pt is a 75 y/o F s/p R TKA by Dr. Rudene Christians ~2 wks prior.  Pt noticed redness and drainage from the incision.  Pt was admitted for IV antibiotics.  Pt's PMH includes anxiety, chronic low back pain, COPD, DDD lumbar, 2L O2 at night, L TKA.    PT Comments    Pt in bed, agrees to supine exercises.  Pt tolerated exercises better this pm allowing increased AROM.    Follow Up Recommendations  SNF     Equipment Recommendations       Recommendations for Other Services       Precautions / Restrictions Precautions Precautions: Fall Restrictions Weight Bearing Restrictions: Yes RLE Weight Bearing: Weight bearing as tolerated    Mobility  Bed Mobility                  Transfers                    Ambulation/Gait                 Stairs            Wheelchair Mobility    Modified Rankin (Stroke Patients Only)       Balance                                    Cognition Arousal/Alertness: Awake/alert Behavior During Therapy: WFL for tasks assessed/performed Overall Cognitive Status: Within Functional Limits for tasks assessed                      Exercises Total Joint Exercises Ankle Circles/Pumps: AROM;Both;10 reps;Supine Quad Sets: Strengthening;Both;10 reps;Supine Gluteal Sets: AROM;Both;10 reps;Supine Short Arc Quad: AAROM;Right;10 reps;Supine Heel Slides: AAROM;Right;10 reps;Supine Hip ABduction/ADduction: AAROM;Right;10 reps;Supine Straight Leg Raises: AAROM;Right;10 reps;Supine    General Comments        Pertinent Vitals/Pain      Home Living                      Prior Function            PT Goals (current goals can now be found in the care plan section) Acute Rehab PT Goals Patient Stated Goal: feeling better     Frequency    BID      PT Plan Current plan remains appropriate    Co-evaluation             End of Session Equipment Utilized During Treatment: Oxygen Activity Tolerance: Patient limited by pain Patient left: in bed;with call bell/phone within reach;with bed alarm set     Time:  -     Charges:                       G Codes:      Chesley Noon 15-Aug-2016, 8:11 AM

## 2016-07-21 LAB — CULTURE, BLOOD (ROUTINE X 2)

## 2016-07-21 LAB — CREATININE, SERUM
Creatinine, Ser: 0.51 mg/dL (ref 0.44–1.00)
GFR calc Af Amer: >60 mL/min (ref >60)
GFR calc non Af Amer: >60 mL/min (ref >60)

## 2016-07-21 MED ORDER — FUROSEMIDE 10 MG/ML IJ SOLN
10.0000 mg | Freq: Once | INTRAMUSCULAR | Status: AC
  Administered 2016-07-21: 10 mg via INTRAVENOUS
  Filled 2016-07-21: qty 2

## 2016-07-21 NOTE — Progress Notes (Signed)
CSW informed by RN that pt will not d/c today. SW informed [facility] John- Admissions at [facility]. CSW will continue to follow and assist.  Ernest Pine, MSW, LCSW, [place] Social Worker [phone]

## 2016-07-21 NOTE — Progress Notes (Signed)
PT Cancellation Note  Patient Details Name: [name] Douglas MRN: [id number] DOB: Feb 12, 1941   Cancelled Treatment:    Reason Eval/Treat Not Completed: Fatigue/lethargy limiting ability to participate   Attempted pm session but pt had just returned to bed with nursing and was asleep before they had left the room.  Will hold session this pm as pt overall is doing better with her RLE strength and ROM and has more medical concerns at this time.     Chesley Noon 07/21/2016, 2:11 PM

## 2016-07-21 NOTE — Progress Notes (Signed)
Clinical Social Worker was informed that patient will be medically ready to discharge to [facility]. Patient in a agreement with plan. CSW called Broadus John- Admissions Coordinator at [facility] to confirm that patient's bed is ready. Provided patient's room number [id number]  and number to call for report [phone] . All discharge information faxed to [facility] via [facility].  RN will call report and patient will discharge to [facility]  via EMS  Ernest Pine, MSW, LCSW, [place] Social Worker [phone]

## 2016-07-21 NOTE — Progress Notes (Addendum)
Report called to Shauntil at [employer]. Right peripheral IV removed. Patient going to [facility] with right upper arm picc for extended abx administration. Belongings packed and nt will ready pt for transport by EMS.

## 2016-07-21 NOTE — Progress Notes (Signed)
   Subjective:    Patient reports pain as mild.   Patient is well, and has had no acute complaints or problems Continue with physical  therapy today.  Plan is to go Rehab after hospital stay. no nausea and no vomiting Patient denies any chest pains or shortness of breath. Denies any fevers or chills. Resting well   Objective: Vital signs in last 24 hours: Temp:  [97.3 F (36.3 C)-98.5 F (36.9 C)] 97.3 F (36.3 C) (12/02 0344) Pulse Rate:  [68-113] 107 (12/02 0727) Resp:  [16-20] 18 (12/02 0727) BP: (124-179)/(39-74) 164/44 (12/02 0727) SpO2:  [88 %-98 %] 88 % (12/02 0727) well approximated incision Heels are non tender and elevated off the bed using rolled towels Intake/Output from previous day: 12/01 0701 - 12/02 0700 In: 4803.8 [P.O.:720; I.V.:3683.8; IV Piggyback:400] Out: 2400 [Urine:2400] Intake/Output this shift: No intake/output data recorded.  No results for input(s): HGB in the last 72 hours. No results for input(s): WBC, RBC, HCT, PLT in the last 72 hours.  Recent Labs  07/21/16 0339  CREATININE 0.51   No results for input(s): LABPT, INR in the last 72 hours.  EXAM General - Patient is Alert, Appropriate and Oriented Extremity - Neurologically intact Neurovascular intact Sensation intact distally Intact pulses distally Compartment soft Dressing - scant drainage Motor Function - intact, moving foot and toes well on exam.    Past Medical History:  Diagnosis Date  . Anxiety   . Arthritis    "shoulders" (08/20/2014)  . Asthma   . Chronic lower back pain   . Complication of anesthesia    "agitated & restless after knee replacement in 2008"  . COPD (chronic obstructive pulmonary disease) ([place])   . DDD (degenerative disc disease), lumbar   . Depression   . Diverticulosis   . GERD (gastroesophageal reflux disease)   . High cholesterol   . Hypertension   . Hypothyroidism   . On home oxygen therapy    "2L at night" (08/20/2014)  . Shortness of breath  dyspnea    with exertion    Assessment/Plan:    Active Problems:   Cellulitis of right knee  Estimated body mass index is 34.86 kg/m as calculated from the following:   Height as of this encounter: 5\' 6"  (1.676 m).   Weight as of this encounter: 98 kg (216 lb). Up with therapy Discharge to SNF  Labs: none DVT Prophylaxis - Lovenox, Foot Pumps and TED hose Weight-Bearing as tolerated to right leg F/u in [facility] with Dr. Rudene Christians on 12/11. Please call and make an appt Continue antibiotics per Dr. Ola Spurr for 2 weeks TED stocking to BOTH legs Please change dressing prior to d/c  Erie Sica R. [place] 07/21/2016, 7:58 AM

## 2016-07-21 NOTE — Progress Notes (Signed)
Physical Therapy Treatment Patient Details Name: [name] Douglas MRN: [id number] DOB: 1941/06/08 Today's Date: 07/21/2016    History of Present Illness Pt is a 75 y/o F s/p R TKA by Dr. Rudene Christians ~2 wks prior.  Pt noticed redness and drainage from the incision.  Pt was admitted for IV antibiotics.  Pt's PMH includes anxiety, chronic low back pain, COPD, DDD lumbar, 2L O2 at night, L TKA.    PT Comments    Pt in bed.  Sleeping a lot this am.  Reported poor sleep last night.  Participated in exercises as described below.  Transitioned to edge of bed with increased time and rail and transferred to recliner at bedside with min assist.  KI not used today due to good quality SLR's.  She was able to tolerate increased WB on RLE today.  During transfer, pt noted to be more SOB than usual.  Sats taken and were noted to be 80% on 2.5 lpm.  They did not increase with deep breathing.  Primary nurse notified and in to check pt.  Pt left in recliner per nurse.  Gait deferred at this time.   Follow Up Recommendations  SNF     Equipment Recommendations       Recommendations for Other Services       Precautions / Restrictions Precautions Precautions: Fall Restrictions Weight Bearing Restrictions: Yes RLE Weight Bearing: Weight bearing as tolerated    Mobility  Bed Mobility Overal bed mobility: Needs Assistance Bed Mobility: Supine to Sit     Supine to sit: Min guard        Transfers Overall transfer level: Needs assistance Equipment used: Rolling walker (2 wheeled) Transfers: Sit to/from Stand Sit to Stand: Min assist            Ambulation/Gait Ambulation/Gait assistance: Min assist Ambulation Distance (Feet): 3 Feet Assistive device: Rolling walker (2 wheeled) Gait Pattern/deviations: Step-to pattern   Gait velocity interpretation: <1.8 ft/sec, indicative of risk for recurrent falls General Gait Details: increased wb today on    Stairs            Wheelchair Mobility     Modified Rankin (Stroke Patients Only)       Balance Overall balance assessment: Needs assistance Sitting-balance support: Feet supported Sitting balance-Leahy Scale: Good     Standing balance support: Bilateral upper extremity supported Standing balance-Leahy Scale: Fair                      Cognition Arousal/Alertness: Awake/alert Behavior During Therapy: WFL for tasks assessed/performed Overall Cognitive Status: Within Functional Limits for tasks assessed                      Exercises Total Joint Exercises Ankle Circles/Pumps: AROM;Both;10 reps;Supine Quad Sets: Strengthening;Both;10 reps;Supine Heel Slides: AAROM;Right;10 reps;Supine Straight Leg Raises: AAROM;Right;10 reps;Supine Long Arc Quad: AAROM;Right;20 reps;Seated Knee Flexion: AAROM;Right;10 reps;Seated    General Comments        Pertinent Vitals/Pain Pain Assessment: 0-10 Pain Score: 3  Pain Location: R knee Pain Descriptors / Indicators: Sore Pain Intervention(s): Limited activity within patient's tolerance    Home Living                      Prior Function            PT Goals (current goals can now be found in the care plan section) Acute Rehab PT Goals Patient Stated Goal: c/o poor sleep last  night Progress towards PT goals: Progressing toward goals    Frequency    BID      PT Plan Current plan remains appropriate    Co-evaluation             End of Session Equipment Utilized During Treatment: Oxygen Activity Tolerance: Treatment limited secondary to medical complications (Comment) (OS sats 80% ) Patient left: in chair;with call bell/phone within reach;with nursing/sitter in room     Time: [id number] PT Time Calculation (min) (ACUTE ONLY): 24 min  Charges:  $Therapeutic Exercise: 8-22 mins $Therapeutic Activity: 8-22 mins                    G Codes:      Chesley Noon 2016/07/22, 1:15 PM

## 2016-07-21 NOTE — Progress Notes (Signed)
Discharge cancelled d/t O2 sats. Dr. Rudene Christians ordered 10mg  iv lasix.

## 2016-07-22 DIAGNOSIS — Z5189 Encounter for other specified aftercare: Secondary | ICD-10-CM | POA: Diagnosis not present

## 2016-07-22 DIAGNOSIS — T8453XA Infection and inflammatory reaction due to internal right knee prosthesis, initial encounter: Secondary | ICD-10-CM | POA: Diagnosis not present

## 2016-07-22 DIAGNOSIS — F329 Major depressive disorder, single episode, unspecified: Secondary | ICD-10-CM | POA: Diagnosis not present

## 2016-07-22 DIAGNOSIS — D649 Anemia, unspecified: Secondary | ICD-10-CM | POA: Diagnosis not present

## 2016-07-22 DIAGNOSIS — R262 Difficulty in walking, not elsewhere classified: Secondary | ICD-10-CM | POA: Diagnosis not present

## 2016-07-22 DIAGNOSIS — Z7401 Bed confinement status: Secondary | ICD-10-CM | POA: Diagnosis not present

## 2016-07-22 DIAGNOSIS — Z7982 Long term (current) use of aspirin: Secondary | ICD-10-CM | POA: Diagnosis not present

## 2016-07-22 DIAGNOSIS — K219 Gastro-esophageal reflux disease without esophagitis: Secondary | ICD-10-CM | POA: Diagnosis not present

## 2016-07-22 DIAGNOSIS — J449 Chronic obstructive pulmonary disease, unspecified: Secondary | ICD-10-CM | POA: Diagnosis not present

## 2016-07-22 DIAGNOSIS — Z452 Encounter for adjustment and management of vascular access device: Secondary | ICD-10-CM | POA: Diagnosis not present

## 2016-07-22 DIAGNOSIS — J45909 Unspecified asthma, uncomplicated: Secondary | ICD-10-CM | POA: Diagnosis not present

## 2016-07-22 DIAGNOSIS — M6281 Muscle weakness (generalized): Secondary | ICD-10-CM | POA: Diagnosis not present

## 2016-07-22 DIAGNOSIS — E785 Hyperlipidemia, unspecified: Secondary | ICD-10-CM | POA: Diagnosis not present

## 2016-07-22 DIAGNOSIS — S82009A Unspecified fracture of unspecified patella, initial encounter for closed fracture: Secondary | ICD-10-CM | POA: Diagnosis not present

## 2016-07-22 DIAGNOSIS — R0602 Shortness of breath: Secondary | ICD-10-CM | POA: Diagnosis not present

## 2016-07-22 DIAGNOSIS — L03115 Cellulitis of right lower limb: Secondary | ICD-10-CM | POA: Diagnosis not present

## 2016-07-22 DIAGNOSIS — Z4789 Encounter for other orthopedic aftercare: Secondary | ICD-10-CM | POA: Diagnosis not present

## 2016-07-22 DIAGNOSIS — Z720 Tobacco use: Secondary | ICD-10-CM | POA: Diagnosis not present

## 2016-07-22 DIAGNOSIS — I10 Essential (primary) hypertension: Secondary | ICD-10-CM | POA: Diagnosis not present

## 2016-07-22 DIAGNOSIS — M1990 Unspecified osteoarthritis, unspecified site: Secondary | ICD-10-CM | POA: Diagnosis not present

## 2016-07-22 DIAGNOSIS — E039 Hypothyroidism, unspecified: Secondary | ICD-10-CM | POA: Diagnosis not present

## 2016-07-22 DIAGNOSIS — Z792 Long term (current) use of antibiotics: Secondary | ICD-10-CM | POA: Diagnosis not present

## 2016-07-22 DIAGNOSIS — M25561 Pain in right knee: Secondary | ICD-10-CM | POA: Diagnosis not present

## 2016-07-22 DIAGNOSIS — E784 Other hyperlipidemia: Secondary | ICD-10-CM | POA: Diagnosis not present

## 2016-07-22 DIAGNOSIS — R7881 Bacteremia: Secondary | ICD-10-CM | POA: Diagnosis not present

## 2016-07-22 DIAGNOSIS — F419 Anxiety disorder, unspecified: Secondary | ICD-10-CM | POA: Diagnosis not present

## 2016-07-22 DIAGNOSIS — R52 Pain, unspecified: Secondary | ICD-10-CM | POA: Diagnosis not present

## 2016-07-22 NOTE — Progress Notes (Signed)
Report called to [place] at [employer]. Patient is ready for discharge. Belongings packed and EMS called.

## 2016-07-22 NOTE — Progress Notes (Signed)
Subjective:   Patient admitted secondary to cellulitis right knee status post total knee arthroplasty Patient reports pain as Better.   Patient is well, and has had no acute complaints or problems Continue with physical therapy today.  Plan is to go Rehab after hospital stay. no nausea and no vomiting Patient denies any chest pains or shortness of breath. Patient states she feels better. Patient was to be discharged to seek resources yesterday but secondary to O2 levels was not and was given Lasix 10 mg IV. Patient also was not using her incentive spirometer which is at rehabilitation center. Think this is causing some of her decreased O2 level. I did try to get her to do some deep breathing on her own today. This did seem to greater a little bit. I think once she is rehabilitation start using her incentive spirometer her O2 levels will improve. Spoke with the nurse to pad her last night and she states that she is doing much better and should be no problem discharged today. Objective: Vital signs in last 24 hours: Temp:  [98 F (36.7 C)-98.9 F (37.2 C)] 98.9 F (37.2 C) (12/03 0715) Pulse Rate:  [104-111] 111 (12/03 0715) Resp:  [18] 18 (12/03 0715) BP: (140-164)/(40-53) 144/53 (12/03 0715) SpO2:  [88 %-97 %] 95 % (12/03 0715) well approximated incision Heels are non tender and elevated off the bed using rolled towels Intake/Output from previous day: 12/02 0701 - 12/03 0700 In: 1038.8 [P.O.:120; I.V.:718.8; IV Piggyback:200] Out: 1050 [Urine:1050] Intake/Output this shift: No intake/output data recorded.  No results for input(s): HGB in the last 72 hours. No results for input(s): WBC, RBC, HCT, PLT in the last 72 hours.  Recent Labs  07/21/16 0339  CREATININE 0.51   No results for input(s): LABPT, INR in the last 72 hours.  EXAM General - Patient is Alert, Appropriate and Oriented Extremity - Neurologically intact Neurovascular intact Sensation intact distally Intact  pulses distally Dorsiflexion/Plantar flexion intact Compartment soft  Right knee on today's visit looks much better. Minimal redness noted. In the family member states that it looks a lot better. No drainage noted after changing the dressing today. Dressing - scant drainage Motor Function - intact, moving foot and toes well on exam.    Past Medical History:  Diagnosis Date  . Anxiety   . Arthritis    "shoulders" (08/20/2014)  . Asthma   . Chronic lower back pain   . Complication of anesthesia    "agitated & restless after knee replacement in 2008"  . COPD (chronic obstructive pulmonary disease) ([place])   . DDD (degenerative disc disease), lumbar   . Depression   . Diverticulosis   . GERD (gastroesophageal reflux disease)   . High cholesterol   . Hypertension   . Hypothyroidism   . On home oxygen therapy    "2L at night" (08/20/2014)  . Shortness of breath dyspnea    with exertion    Assessment/Plan:    Active Problems:   Cellulitis of right knee  Estimated body mass index is 34.86 kg/m as calculated from the following:   Height as of this encounter: 5\' 6"  (1.676 m).   Weight as of this encounter: 98 kg (216 lb). Up with therapy Discharge to SNF  Labs: None DVT Prophylaxis - Lovenox, Foot Pumps and TED hose Weight-Bearing as tolerated to right leg D/C O2 and Pulse OX and try on Room Air Needs to have a bowel movement today. Dressing changes on today's visit. Encouraged  patient as well as instructed her daughter that when she gets back to rehabilitation she needs to use incentive spirometer every hour that she is awake.  Jillyn Ledger. [place] 07/22/2016, 7:23 AM

## 2016-07-22 NOTE — Progress Notes (Signed)
Physical Therapy Treatment Patient Details Name: [name] Douglas MRN: [id number] DOB: 10/28/1940 Today's Date: 07/22/2016    History of Present Illness Pt is a 75 y/o F s/p R TKA by Dr. Rudene Christians ~2 wks prior.  Pt noticed redness and drainage from the incision.  Pt was admitted for IV antibiotics.  Pt's PMH includes anxiety, chronic low back pain, COPD, DDD lumbar, 2L O2 at night, L TKA.    PT Comments    Pt agrees to session.  Feeling better today.  Participated in exercises as described below.  Pt was able to ambulate 5' and 3' today with walker and min guard.  Pt limited by c/o pain in post LE with wb and requested to sit.  ROM continues to progress daily along with dec pain and redness.  Anticipate d/c to rehab today.   Follow Up Recommendations  SNF     Equipment Recommendations       Recommendations for Other Services       Precautions / Restrictions Precautions Precautions: Fall Restrictions Weight Bearing Restrictions: Yes RLE Weight Bearing: Weight bearing as tolerated    Mobility  Bed Mobility Overal bed mobility: Needs Assistance Bed Mobility: Supine to Sit     Supine to sit: Min guard        Transfers Overall transfer level: Needs assistance Equipment used: Rolling walker (2 wheeled) Transfers: Sit to/from Stand Sit to Stand: Min guard Stand pivot transfers: Min guard          Ambulation/Gait Ambulation/Gait assistance: Min guard Ambulation Distance (Feet): 5 Feet Assistive device: Rolling walker (2 wheeled) Gait Pattern/deviations: Step-to pattern   Gait velocity interpretation: <1.8 ft/sec, indicative of risk for recurrent falls     Stairs            Wheelchair Mobility    Modified Rankin (Stroke Patients Only)       Balance Overall balance assessment: Needs assistance Sitting-balance support: Feet supported Sitting balance-Leahy Scale: Good       Standing balance-Leahy Scale: Fair                      Cognition  Arousal/Alertness: Awake/alert Behavior During Therapy: WFL for tasks assessed/performed Overall Cognitive Status: Within Functional Limits for tasks assessed                      Exercises Total Joint Exercises Ankle Circles/Pumps: AROM;Both;10 reps;Supine Quad Sets: Strengthening;Both;10 reps;Supine Gluteal Sets: AROM;Both;10 reps;Supine Short Arc Quad: AAROM;Right;10 reps;Supine Heel Slides: AAROM;Right;10 reps;Supine Hip ABduction/ADduction: AAROM;Right;10 reps;Supine Straight Leg Raises: AAROM;Right;10 reps;Supine Long Arc Quad: AAROM;Right;20 reps;Seated Knee Flexion: AAROM;Right;10 reps;Seated Goniometric ROM: 3-81    General Comments        Pertinent Vitals/Pain Pain Assessment: 0-10 Pain Score: 4  Pain Location: R knee Pain Descriptors / Indicators: Sore Pain Intervention(s): Limited activity within patient's tolerance    Home Living                      Prior Function            PT Goals (current goals can now be found in the care plan section) Progress towards PT goals: Progressing toward goals    Frequency    BID      PT Plan Current plan remains appropriate    Co-evaluation             End of Session Equipment Utilized During Treatment: Gait belt;Oxygen Activity Tolerance: Patient tolerated treatment  well;Patient limited by pain Patient left: in chair;with call bell/phone within reach;with nursing/sitter in room     Time: 0950-1007 PT Time Calculation (min) (ACUTE ONLY): 17 min  Charges:  $Therapeutic Exercise: 8-22 mins                    G Codes:      Chesley Noon 08/03/2016, 10:45 AM

## 2016-07-22 NOTE — Clinical Social Work Note (Signed)
Patient medically cleared for dc to [facility] today via non-emergent EMS for o2 control. The patient and facility are aware. The CSW left a voicemail with the patient's daughter [name] at the request of the patient to inform her of the dc. CSW will con't to follow pending any additional dc needs.  Santiago Bumpers, MSW, LCSW-A [phone]

## 2016-07-24 LAB — CULTURE, BLOOD (ROUTINE X 2)
Culture: NO GROWTH
Culture: NO GROWTH

## 2016-07-25 DIAGNOSIS — J449 Chronic obstructive pulmonary disease, unspecified: Secondary | ICD-10-CM | POA: Diagnosis not present

## 2016-07-25 DIAGNOSIS — F329 Major depressive disorder, single episode, unspecified: Secondary | ICD-10-CM | POA: Diagnosis not present

## 2016-07-25 DIAGNOSIS — I10 Essential (primary) hypertension: Secondary | ICD-10-CM | POA: Diagnosis not present

## 2016-07-25 DIAGNOSIS — E784 Other hyperlipidemia: Secondary | ICD-10-CM | POA: Diagnosis not present

## 2016-07-25 DIAGNOSIS — E039 Hypothyroidism, unspecified: Secondary | ICD-10-CM | POA: Diagnosis not present

## 2016-07-25 DIAGNOSIS — S82009A Unspecified fracture of unspecified patella, initial encounter for closed fracture: Secondary | ICD-10-CM | POA: Diagnosis not present

## 2016-07-31 DIAGNOSIS — D649 Anemia, unspecified: Secondary | ICD-10-CM | POA: Diagnosis not present

## 2016-07-31 DIAGNOSIS — E039 Hypothyroidism, unspecified: Secondary | ICD-10-CM | POA: Diagnosis not present

## 2016-07-31 DIAGNOSIS — E784 Other hyperlipidemia: Secondary | ICD-10-CM | POA: Diagnosis not present

## 2016-07-31 DIAGNOSIS — J449 Chronic obstructive pulmonary disease, unspecified: Secondary | ICD-10-CM | POA: Diagnosis not present

## 2016-07-31 DIAGNOSIS — F329 Major depressive disorder, single episode, unspecified: Secondary | ICD-10-CM | POA: Diagnosis not present

## 2016-07-31 DIAGNOSIS — I10 Essential (primary) hypertension: Secondary | ICD-10-CM | POA: Diagnosis not present

## 2016-08-01 DIAGNOSIS — R7881 Bacteremia: Secondary | ICD-10-CM | POA: Diagnosis not present

## 2016-08-01 DIAGNOSIS — L03115 Cellulitis of right lower limb: Secondary | ICD-10-CM | POA: Diagnosis not present

## 2016-08-01 DIAGNOSIS — Z792 Long term (current) use of antibiotics: Secondary | ICD-10-CM | POA: Diagnosis not present

## 2016-08-02 ENCOUNTER — Other Ambulatory Visit: Payer: Self-pay | Admitting: *Deleted

## 2016-08-02 NOTE — Patient Outreach (Signed)
[facility]) Care Management  08/02/2016  [name] Douglas 11/26/1940 [id number]  RNCM met with LCSW at facility, he confirms patient to discharge 12/15, no [facility] program needs. Patient will have home care to complete IV antibiotics scheduled through 12/29.  Plan to sign off. [name] Douglas. Laymond Purser, RN, BSN, CCM [facility] post-acute care coordinator [phone]

## 2016-08-03 DIAGNOSIS — F419 Anxiety disorder, unspecified: Secondary | ICD-10-CM | POA: Diagnosis not present

## 2016-08-03 DIAGNOSIS — I10 Essential (primary) hypertension: Secondary | ICD-10-CM | POA: Diagnosis not present

## 2016-08-03 DIAGNOSIS — Z452 Encounter for adjustment and management of vascular access device: Secondary | ICD-10-CM | POA: Diagnosis not present

## 2016-08-03 DIAGNOSIS — J449 Chronic obstructive pulmonary disease, unspecified: Secondary | ICD-10-CM | POA: Diagnosis not present

## 2016-08-03 DIAGNOSIS — J45909 Unspecified asthma, uncomplicated: Secondary | ICD-10-CM | POA: Diagnosis not present

## 2016-08-03 DIAGNOSIS — T8453XA Infection and inflammatory reaction due to internal right knee prosthesis, initial encounter: Secondary | ICD-10-CM | POA: Diagnosis not present

## 2016-08-04 DIAGNOSIS — F419 Anxiety disorder, unspecified: Secondary | ICD-10-CM | POA: Diagnosis not present

## 2016-08-04 DIAGNOSIS — J45909 Unspecified asthma, uncomplicated: Secondary | ICD-10-CM | POA: Diagnosis not present

## 2016-08-04 DIAGNOSIS — T8453XA Infection and inflammatory reaction due to internal right knee prosthesis, initial encounter: Secondary | ICD-10-CM | POA: Diagnosis not present

## 2016-08-04 DIAGNOSIS — Z452 Encounter for adjustment and management of vascular access device: Secondary | ICD-10-CM | POA: Diagnosis not present

## 2016-08-04 DIAGNOSIS — I10 Essential (primary) hypertension: Secondary | ICD-10-CM | POA: Diagnosis not present

## 2016-08-04 DIAGNOSIS — J449 Chronic obstructive pulmonary disease, unspecified: Secondary | ICD-10-CM | POA: Diagnosis not present

## 2016-08-05 DIAGNOSIS — R112 Nausea with vomiting, unspecified: Secondary | ICD-10-CM | POA: Diagnosis not present

## 2016-08-07 DIAGNOSIS — Z452 Encounter for adjustment and management of vascular access device: Secondary | ICD-10-CM | POA: Diagnosis not present

## 2016-08-07 DIAGNOSIS — J45909 Unspecified asthma, uncomplicated: Secondary | ICD-10-CM | POA: Diagnosis not present

## 2016-08-07 DIAGNOSIS — Z792 Long term (current) use of antibiotics: Secondary | ICD-10-CM | POA: Diagnosis not present

## 2016-08-07 DIAGNOSIS — F419 Anxiety disorder, unspecified: Secondary | ICD-10-CM | POA: Diagnosis not present

## 2016-08-07 DIAGNOSIS — J449 Chronic obstructive pulmonary disease, unspecified: Secondary | ICD-10-CM | POA: Diagnosis not present

## 2016-08-07 DIAGNOSIS — T8453XA Infection and inflammatory reaction due to internal right knee prosthesis, initial encounter: Secondary | ICD-10-CM | POA: Diagnosis not present

## 2016-08-07 DIAGNOSIS — I10 Essential (primary) hypertension: Secondary | ICD-10-CM | POA: Diagnosis not present

## 2016-08-08 DIAGNOSIS — J449 Chronic obstructive pulmonary disease, unspecified: Secondary | ICD-10-CM | POA: Diagnosis not present

## 2016-08-08 DIAGNOSIS — I10 Essential (primary) hypertension: Secondary | ICD-10-CM | POA: Diagnosis not present

## 2016-08-08 DIAGNOSIS — F419 Anxiety disorder, unspecified: Secondary | ICD-10-CM | POA: Diagnosis not present

## 2016-08-08 DIAGNOSIS — T8453XA Infection and inflammatory reaction due to internal right knee prosthesis, initial encounter: Secondary | ICD-10-CM | POA: Diagnosis not present

## 2016-08-08 DIAGNOSIS — J45909 Unspecified asthma, uncomplicated: Secondary | ICD-10-CM | POA: Diagnosis not present

## 2016-08-08 DIAGNOSIS — Z452 Encounter for adjustment and management of vascular access device: Secondary | ICD-10-CM | POA: Diagnosis not present

## 2016-08-10 DIAGNOSIS — J449 Chronic obstructive pulmonary disease, unspecified: Secondary | ICD-10-CM | POA: Diagnosis not present

## 2016-08-10 DIAGNOSIS — I10 Essential (primary) hypertension: Secondary | ICD-10-CM | POA: Diagnosis not present

## 2016-08-10 DIAGNOSIS — Z452 Encounter for adjustment and management of vascular access device: Secondary | ICD-10-CM | POA: Diagnosis not present

## 2016-08-10 DIAGNOSIS — F419 Anxiety disorder, unspecified: Secondary | ICD-10-CM | POA: Diagnosis not present

## 2016-08-10 DIAGNOSIS — T8453XA Infection and inflammatory reaction due to internal right knee prosthesis, initial encounter: Secondary | ICD-10-CM | POA: Diagnosis not present

## 2016-08-10 DIAGNOSIS — J45909 Unspecified asthma, uncomplicated: Secondary | ICD-10-CM | POA: Diagnosis not present

## 2016-08-14 DIAGNOSIS — Z452 Encounter for adjustment and management of vascular access device: Secondary | ICD-10-CM | POA: Diagnosis not present

## 2016-08-14 DIAGNOSIS — F419 Anxiety disorder, unspecified: Secondary | ICD-10-CM | POA: Diagnosis not present

## 2016-08-14 DIAGNOSIS — L0390 Cellulitis, unspecified: Secondary | ICD-10-CM | POA: Diagnosis not present

## 2016-08-14 DIAGNOSIS — J45909 Unspecified asthma, uncomplicated: Secondary | ICD-10-CM | POA: Diagnosis not present

## 2016-08-14 DIAGNOSIS — T8453XA Infection and inflammatory reaction due to internal right knee prosthesis, initial encounter: Secondary | ICD-10-CM | POA: Diagnosis not present

## 2016-08-14 DIAGNOSIS — I10 Essential (primary) hypertension: Secondary | ICD-10-CM | POA: Diagnosis not present

## 2016-08-14 DIAGNOSIS — J449 Chronic obstructive pulmonary disease, unspecified: Secondary | ICD-10-CM | POA: Diagnosis not present

## 2016-08-15 DIAGNOSIS — I10 Essential (primary) hypertension: Secondary | ICD-10-CM | POA: Diagnosis not present

## 2016-08-15 DIAGNOSIS — T8453XA Infection and inflammatory reaction due to internal right knee prosthesis, initial encounter: Secondary | ICD-10-CM | POA: Diagnosis not present

## 2016-08-15 DIAGNOSIS — J449 Chronic obstructive pulmonary disease, unspecified: Secondary | ICD-10-CM | POA: Diagnosis not present

## 2016-08-15 DIAGNOSIS — J45909 Unspecified asthma, uncomplicated: Secondary | ICD-10-CM | POA: Diagnosis not present

## 2016-08-15 DIAGNOSIS — Z452 Encounter for adjustment and management of vascular access device: Secondary | ICD-10-CM | POA: Diagnosis not present

## 2016-08-15 DIAGNOSIS — F419 Anxiety disorder, unspecified: Secondary | ICD-10-CM | POA: Diagnosis not present

## 2016-08-17 DIAGNOSIS — J45909 Unspecified asthma, uncomplicated: Secondary | ICD-10-CM | POA: Diagnosis not present

## 2016-08-17 DIAGNOSIS — Z452 Encounter for adjustment and management of vascular access device: Secondary | ICD-10-CM | POA: Diagnosis not present

## 2016-08-17 DIAGNOSIS — I10 Essential (primary) hypertension: Secondary | ICD-10-CM | POA: Diagnosis not present

## 2016-08-17 DIAGNOSIS — F419 Anxiety disorder, unspecified: Secondary | ICD-10-CM | POA: Diagnosis not present

## 2016-08-17 DIAGNOSIS — T8453XA Infection and inflammatory reaction due to internal right knee prosthesis, initial encounter: Secondary | ICD-10-CM | POA: Diagnosis not present

## 2016-08-17 DIAGNOSIS — J449 Chronic obstructive pulmonary disease, unspecified: Secondary | ICD-10-CM | POA: Diagnosis not present

## 2016-08-21 DIAGNOSIS — I10 Essential (primary) hypertension: Secondary | ICD-10-CM | POA: Diagnosis not present

## 2016-08-21 DIAGNOSIS — Z452 Encounter for adjustment and management of vascular access device: Secondary | ICD-10-CM | POA: Diagnosis not present

## 2016-08-21 DIAGNOSIS — J45909 Unspecified asthma, uncomplicated: Secondary | ICD-10-CM | POA: Diagnosis not present

## 2016-08-21 DIAGNOSIS — J449 Chronic obstructive pulmonary disease, unspecified: Secondary | ICD-10-CM | POA: Diagnosis not present

## 2016-08-21 DIAGNOSIS — T8453XA Infection and inflammatory reaction due to internal right knee prosthesis, initial encounter: Secondary | ICD-10-CM | POA: Diagnosis not present

## 2016-08-21 DIAGNOSIS — F419 Anxiety disorder, unspecified: Secondary | ICD-10-CM | POA: Diagnosis not present

## 2016-08-22 ENCOUNTER — Emergency Department: Payer: Medicare Other

## 2016-08-22 ENCOUNTER — Encounter: Payer: Self-pay | Admitting: Radiology

## 2016-08-22 ENCOUNTER — Inpatient Hospital Stay (HOSPITAL_COMMUNITY)
Admission: AD | Admit: 2016-08-22 | Discharge: 2016-08-24 | DRG: 069 | Disposition: A | Discharge status: Home Health | Payer: Medicare Other | Source: Other Acute Inpatient Hospital | Attending: Neurology | Admitting: Neurology

## 2016-08-22 ENCOUNTER — Inpatient Hospital Stay (HOSPITAL_COMMUNITY): Payer: Medicare Other

## 2016-08-22 ENCOUNTER — Emergency Department
Admission: EM | Admit: 2016-08-22 | Discharge: 2016-08-22 | Disposition: A | Discharge status: Other Acute Inpatient Hospital | Payer: Medicare Other | Attending: Emergency Medicine | Admitting: Emergency Medicine

## 2016-08-22 DIAGNOSIS — J441 Chronic obstructive pulmonary disease with (acute) exacerbation: Secondary | ICD-10-CM | POA: Diagnosis present

## 2016-08-22 DIAGNOSIS — Z7982 Long term (current) use of aspirin: Secondary | ICD-10-CM

## 2016-08-22 DIAGNOSIS — I619 Nontraumatic intracerebral hemorrhage, unspecified: Secondary | ICD-10-CM

## 2016-08-22 DIAGNOSIS — G458 Other transient cerebral ischemic attacks and related syndromes: Secondary | ICD-10-CM | POA: Diagnosis not present

## 2016-08-22 DIAGNOSIS — R410 Disorientation, unspecified: Secondary | ICD-10-CM

## 2016-08-22 DIAGNOSIS — R471 Dysarthria and anarthria: Secondary | ICD-10-CM | POA: Diagnosis not present

## 2016-08-22 DIAGNOSIS — I10 Essential (primary) hypertension: Secondary | ICD-10-CM | POA: Insufficient documentation

## 2016-08-22 DIAGNOSIS — K219 Gastro-esophageal reflux disease without esophagitis: Secondary | ICD-10-CM | POA: Diagnosis present

## 2016-08-22 DIAGNOSIS — I6503 Occlusion and stenosis of bilateral vertebral arteries: Secondary | ICD-10-CM | POA: Diagnosis not present

## 2016-08-22 DIAGNOSIS — Z8249 Family history of ischemic heart disease and other diseases of the circulatory system: Secondary | ICD-10-CM | POA: Diagnosis not present

## 2016-08-22 DIAGNOSIS — Z791 Long term (current) use of non-steroidal anti-inflammatories (NSAID): Secondary | ICD-10-CM

## 2016-08-22 DIAGNOSIS — E039 Hypothyroidism, unspecified: Secondary | ICD-10-CM | POA: Diagnosis present

## 2016-08-22 DIAGNOSIS — Z96653 Presence of artificial knee joint, bilateral: Secondary | ICD-10-CM | POA: Diagnosis present

## 2016-08-22 DIAGNOSIS — T814XXA Infection following a procedure, initial encounter: Secondary | ICD-10-CM | POA: Diagnosis present

## 2016-08-22 DIAGNOSIS — R1110 Vomiting, unspecified: Secondary | ICD-10-CM

## 2016-08-22 DIAGNOSIS — R4701 Aphasia: Secondary | ICD-10-CM | POA: Diagnosis present

## 2016-08-22 DIAGNOSIS — F17210 Nicotine dependence, cigarettes, uncomplicated: Secondary | ICD-10-CM | POA: Diagnosis present

## 2016-08-22 DIAGNOSIS — R911 Solitary pulmonary nodule: Secondary | ICD-10-CM | POA: Diagnosis present

## 2016-08-22 DIAGNOSIS — Z79899 Other long term (current) drug therapy: Secondary | ICD-10-CM | POA: Diagnosis not present

## 2016-08-22 DIAGNOSIS — Z801 Family history of malignant neoplasm of trachea, bronchus and lung: Secondary | ICD-10-CM

## 2016-08-22 DIAGNOSIS — J45909 Unspecified asthma, uncomplicated: Secondary | ICD-10-CM | POA: Insufficient documentation

## 2016-08-22 DIAGNOSIS — F329 Major depressive disorder, single episode, unspecified: Secondary | ICD-10-CM | POA: Diagnosis present

## 2016-08-22 DIAGNOSIS — J449 Chronic obstructive pulmonary disease, unspecified: Secondary | ICD-10-CM | POA: Diagnosis present

## 2016-08-22 DIAGNOSIS — E785 Hyperlipidemia, unspecified: Secondary | ICD-10-CM | POA: Diagnosis not present

## 2016-08-22 DIAGNOSIS — I639 Cerebral infarction, unspecified: Secondary | ICD-10-CM

## 2016-08-22 DIAGNOSIS — G459 Transient cerebral ischemic attack, unspecified: Principal | ICD-10-CM | POA: Diagnosis present

## 2016-08-22 DIAGNOSIS — Z833 Family history of diabetes mellitus: Secondary | ICD-10-CM | POA: Diagnosis not present

## 2016-08-22 DIAGNOSIS — M5136 Other intervertebral disc degeneration, lumbar region: Secondary | ICD-10-CM | POA: Diagnosis present

## 2016-08-22 DIAGNOSIS — R29818 Other symptoms and signs involving the nervous system: Secondary | ICD-10-CM | POA: Diagnosis not present

## 2016-08-22 DIAGNOSIS — I638 Other cerebral infarction: Secondary | ICD-10-CM | POA: Diagnosis not present

## 2016-08-22 DIAGNOSIS — Z720 Tobacco use: Secondary | ICD-10-CM | POA: Diagnosis present

## 2016-08-22 DIAGNOSIS — E7800 Pure hypercholesterolemia, unspecified: Secondary | ICD-10-CM | POA: Diagnosis present

## 2016-08-22 DIAGNOSIS — L03115 Cellulitis of right lower limb: Secondary | ICD-10-CM | POA: Diagnosis not present

## 2016-08-22 DIAGNOSIS — Z9282 Status post administration of tPA (rtPA) in a different facility within the last 24 hours prior to admission to current facility: Secondary | ICD-10-CM | POA: Diagnosis not present

## 2016-08-22 DIAGNOSIS — G451 Carotid artery syndrome (hemispheric): Secondary | ICD-10-CM | POA: Diagnosis not present

## 2016-08-22 LAB — COMPREHENSIVE METABOLIC PANEL
ALT: 8 U/L — ABNORMAL LOW (ref 14–54)
AST: 22 U/L (ref 15–41)
Albumin: 3.7 g/dL (ref 3.5–5.0)
Alkaline Phosphatase: 102 U/L (ref 38–126)
Anion gap: 11 (ref 5–15)
BUN: 10 mg/dL (ref 6–20)
CO2: 27 mmol/L (ref 22–32)
Calcium: 9.6 mg/dL (ref 8.9–10.3)
Chloride: 98 mmol/L — ABNORMAL LOW (ref 101–111)
Creatinine, Ser: 0.89 mg/dL (ref 0.44–1.00)
GFR calc Af Amer: >60 mL/min (ref >60)
GFR calc non Af Amer: >60 mL/min (ref >60)
Glucose, Bld: 105 mg/dL — ABNORMAL HIGH (ref 65–99)
Potassium: 5.1 mmol/L (ref 3.5–5.1)
Sodium: 136 mmol/L (ref 135–145)
Total Bilirubin: 0.5 mg/dL (ref 0.3–1.2)
Total Protein: 8.6 g/dL — ABNORMAL HIGH (ref 6.5–8.1)

## 2016-08-22 LAB — CBC
HCT: 35.2 % (ref 35.0–47.0)
Hemoglobin: 11.7 g/dL — ABNORMAL LOW (ref 12.0–16.0)
MCH: 26.4 pg (ref 26.0–34.0)
MCHC: 33.2 g/dL (ref 32.0–36.0)
MCV: 79.6 fL — ABNORMAL LOW (ref 80.0–100.0)
Platelets: 315 10*3/uL (ref 150–440)
RBC: 4.42 MIL/uL (ref 3.80–5.20)
RDW: 16.1 % — ABNORMAL HIGH (ref 11.5–14.5)
WBC: 9 10*3/uL (ref 3.6–11.0)

## 2016-08-22 LAB — DIFFERENTIAL
Basophils Absolute: 0 10*3/uL (ref 0–0.1)
Basophils Relative: 0 %
Eosinophils Absolute: 0.8 10*3/uL — ABNORMAL HIGH (ref 0–0.7)
Eosinophils Relative: 9 %
Lymphocytes Relative: 9 %
Lymphs Abs: 0.8 10*3/uL — ABNORMAL LOW (ref 1.0–3.6)
Monocytes Absolute: 0.6 10*3/uL (ref 0.2–0.9)
Monocytes Relative: 7 %
Neutro Abs: 6.8 10*3/uL — ABNORMAL HIGH (ref 1.4–6.5)
Neutrophils Relative %: 75 %

## 2016-08-22 LAB — PROTIME-INR
INR: 1.05
Prothrombin Time: 13.7 seconds (ref 11.4–15.2)

## 2016-08-22 LAB — TROPONIN I: Troponin I: <0.03 ng/mL (ref <0.03)

## 2016-08-22 LAB — APTT: aPTT: 43 seconds — ABNORMAL HIGH (ref 24–36)

## 2016-08-22 LAB — MRSA PCR SCREENING: MRSA by PCR: NEGATIVE

## 2016-08-22 LAB — GLUCOSE, CAPILLARY: Glucose-Capillary: 123 mg/dL — ABNORMAL HIGH (ref 65–99)

## 2016-08-22 MED ORDER — SODIUM CHLORIDE 0.9 % IV SOLN
50.0000 mL | Freq: Once | INTRAVENOUS | Status: AC
  Administered 2016-08-22: 50 mL via INTRAVENOUS

## 2016-08-22 MED ORDER — METOPROLOL TARTRATE 50 MG PO TABS
50.0000 mg | ORAL_TABLET | Freq: Every evening | ORAL | Status: DC
  Administered 2016-08-23 – 2016-08-24 (×2): 50 mg via ORAL
  Filled 2016-08-22 (×2): qty 1

## 2016-08-22 MED ORDER — OXYCODONE-ACETAMINOPHEN 5-325 MG PO TABS
1.0000 | ORAL_TABLET | ORAL | Status: DC | PRN
Start: 2016-08-22 — End: 2016-08-24

## 2016-08-22 MED ORDER — ALTEPLASE 100 MG IV SOLR
INTRAVENOUS | Status: AC
  Filled 2016-08-22: qty 100

## 2016-08-22 MED ORDER — ONDANSETRON HCL 4 MG/2ML IJ SOLN
INTRAMUSCULAR | Status: AC
  Filled 2016-08-22: qty 2

## 2016-08-22 MED ORDER — ACETAMINOPHEN 325 MG PO TABS: 650.0000 mg | ORAL_TABLET | ORAL | Status: DC | PRN

## 2016-08-22 MED ORDER — GABAPENTIN 100 MG PO CAPS
100.0000 mg | ORAL_CAPSULE | Freq: Three times a day (TID) | ORAL | Status: DC
  Administered 2016-08-23 – 2016-08-24 (×5): 100 mg via ORAL
  Filled 2016-08-22 (×5): qty 1

## 2016-08-22 MED ORDER — ONDANSETRON HCL 4 MG/2ML IJ SOLN
4.0000 mg | Freq: Once | INTRAMUSCULAR | Status: AC
Start: 2016-08-22 — End: 2016-08-22
  Administered 2016-08-22: 4 mg via INTRAVENOUS

## 2016-08-22 MED ORDER — ORAL CARE MOUTH RINSE
15.0000 mL | Freq: Two times a day (BID) | OROMUCOSAL | Status: DC
  Administered 2016-08-22 – 2016-08-23 (×3): 15 mL via OROMUCOSAL

## 2016-08-22 MED ORDER — ACETAMINOPHEN 650 MG RE SUPP: 650.0000 mg | RECTAL | Status: DC | PRN

## 2016-08-22 MED ORDER — OXYCODONE-ACETAMINOPHEN 7.5-325 MG PO TABS: 2.0000 | ORAL_TABLET | ORAL | Status: DC | PRN

## 2016-08-22 MED ORDER — PANTOPRAZOLE SODIUM 40 MG IV SOLR
40.0000 mg | Freq: Every day | INTRAVENOUS | Status: DC
  Administered 2016-08-22: 40 mg via INTRAVENOUS
  Filled 2016-08-22: qty 40

## 2016-08-22 MED ORDER — LABETALOL HCL 5 MG/ML IV SOLN
20.0000 mg | Freq: Once | INTRAVENOUS | Status: DC
  Filled 2016-08-22: qty 4

## 2016-08-22 MED ORDER — ATORVASTATIN CALCIUM 10 MG PO TABS
10.0000 mg | ORAL_TABLET | Freq: Every evening | ORAL | Status: DC
  Administered 2016-08-23 – 2016-08-24 (×2): 10 mg via ORAL
  Filled 2016-08-22 (×2): qty 1

## 2016-08-22 MED ORDER — ALTEPLASE (STROKE) FULL DOSE INFUSION
0.9000 mg/kg | Freq: Once | INTRAVENOUS | Status: AC
  Administered 2016-08-22: 87 mg via INTRAVENOUS
  Filled 2016-08-22: qty 100

## 2016-08-22 MED ORDER — VENLAFAXINE HCL ER 75 MG PO CP24
225.0000 mg | ORAL_CAPSULE | Freq: Every day | ORAL | Status: DC
  Administered 2016-08-24: 225 mg via ORAL
  Filled 2016-08-22 (×2): qty 1
  Filled 2016-08-22: qty 3

## 2016-08-22 MED ORDER — ONDANSETRON HCL 4 MG/2ML IJ SOLN
4.0000 mg | Freq: Once | INTRAMUSCULAR | Status: AC
  Administered 2016-08-22: 4 mg via INTRAVENOUS

## 2016-08-22 MED ORDER — IOPAMIDOL (ISOVUE-370) INJECTION 76%
75.0000 mL | Freq: Once | INTRAVENOUS | Status: AC | PRN
  Administered 2016-08-22: 75 mL via INTRAVENOUS

## 2016-08-22 MED ORDER — STROKE: EARLY STAGES OF RECOVERY BOOK
Freq: Once | Status: DC
Start: 2016-08-22 — End: 2016-08-24
  Filled 2016-08-22: qty 1

## 2016-08-22 MED ORDER — SODIUM CHLORIDE 0.9 % IV SOLN
INTRAVENOUS | Status: DC
  Administered 2016-08-22 – 2016-08-23 (×2): via INTRAVENOUS

## 2016-08-22 MED ORDER — CLEVIDIPINE BUTYRATE 0.5 MG/ML IV EMUL: 0.0000 mg/h | INTRAVENOUS | Status: DC

## 2016-08-22 MED ORDER — LEVOTHYROXINE SODIUM 88 MCG PO TABS
88.0000 ug | ORAL_TABLET | Freq: Every day | ORAL | Status: DC
  Administered 2016-08-23 – 2016-08-24 (×2): 88 ug via ORAL
  Filled 2016-08-22 (×2): qty 1

## 2016-08-22 MED ORDER — OXYCODONE HCL 5 MG PO TABS: 5.0000 mg | ORAL_TABLET | ORAL | Status: DC | PRN

## 2016-08-22 MED ORDER — ACETAMINOPHEN 160 MG/5ML PO SOLN: 650.0000 mg | ORAL | Status: DC | PRN

## 2016-08-22 MED ORDER — SENNOSIDES-DOCUSATE SODIUM 8.6-50 MG PO TABS: 1.0000 | ORAL_TABLET | Freq: Every evening | ORAL | Status: DC | PRN

## 2016-08-22 NOTE — ED Notes (Signed)
Administered 9mg  bolus TPA, now infusing 77mg  over an hour of a total of 86mg .

## 2016-08-22 NOTE — ED Notes (Signed)
[facility] at bedside 

## 2016-08-22 NOTE — ED Triage Notes (Signed)
Pt her with her exhusband, states she called the pt at 9am and pt was normal, states when he went to pick her up at 1025 pt has weakness, not able to stand alone with garbled speech

## 2016-08-22 NOTE — Consult Note (Signed)
Referring Physician: Joni Fears    Chief Complaint: Difficulty with speech  HPI: [name] Douglas is an 76 y.o. female with multiple medical problems who has been under the care of ID for a post op infection from a knee surgery in mid November.  Patient complained of a headache last evening but this was relieved with Tylenol.  This AM was to go to a doctor's appointment and when she spoke with her ex-husband was at baseline.  When he came to lick her up at [phone] she had slurred speech.  She was taken the doctor who sent her to the ED.   Initial NIHSS of 9.    Date last known well: Date: 08/22/2016 Time last known well: Time: 09:00 tPA Given: Yes  Past Medical History:  Diagnosis Date  . Anxiety   . Arthritis    "shoulders" (08/20/2014)  . Asthma   . Chronic lower back pain   . Complication of anesthesia    "agitated & restless after knee replacement in 2008"  . COPD (chronic obstructive pulmonary disease) ([place])   . DDD (degenerative disc disease), lumbar   . Depression   . Diverticulosis   . GERD (gastroesophageal reflux disease)   . High cholesterol   . Hypertension   . Hypothyroidism   . On home oxygen therapy    "2L at night" (08/20/2014)  . Shortness of breath dyspnea    with exertion    Past Surgical History:  Procedure Laterality Date  . CARDIAC CATHETERIZATION  ~ 2007  . EXCISIONAL HEMORRHOIDECTOMY  1960's  . EYE SURGERY Bilateral    Cataract extraction with IOL  . JOINT REPLACEMENT Left 2009  . KNEE ARTHROSCOPY Left X 2 <2008  . KNEE ARTHROSCOPY WITH SUBCHONDROPLASTY Right 04/19/2016   Procedure: KNEE ARTHROSCOPY WITH SUBCHONDROPLASTY, PARTIAL MENISCECTOMY;  Surgeon: Hessie Knows, MD;  Location: [facility] ORS;  Service: Orthopedics;  Laterality: Right;  . SHOULDER OPEN ROTATOR CUFF REPAIR Right 2001  . TONSILLECTOMY  ~ 1950  . TOTAL KNEE ARTHROPLASTY Left 2008  . TOTAL KNEE ARTHROPLASTY Right 07/03/2016   Procedure: TOTAL KNEE ARTHROPLASTY;  Surgeon: Hessie Knows, MD;   Location: [facility];  Service: Orthopedics;  Laterality: Right;  . TUBAL LIGATION  1973  . VAGINAL HYSTERECTOMY  1974    Family History  Problem Relation Age of Onset  . Diabetes Mother   . Hypertension Mother   . Lung cancer Mother   . Congestive Heart Failure Father   . Congestive Heart Failure Sister    Social History:  reports that she has been smoking Cigarettes.  She has a 26.50 pack-year smoking history. She has never used smokeless tobacco. She reports that she does not drink alcohol or use drugs.  Allergies: No Known Allergies  Medications: I have reviewed the patient's current medications. Prior to Admission:  Prior to Admission medications   Medication Sig Start Date End Date Taking? Authorizing Provider  albuterol (PROVENTIL HFA;VENTOLIN HFA) 108 (90 BASE) MCG/ACT inhaler Inhale 2 puffs into the lungs every 6 (six) hours as needed for wheezing or shortness of breath. 08/28/14   Ripudeep Krystal Eaton, MD  albuterol (PROVENTIL) (2.5 MG/3ML) 0.083% nebulizer solution Take 3 mLs (2.5 mg total) by nebulization 2 (two) times daily. Patient taking differently: Take 2.5 mg by nebulization as needed.  08/28/14   Ripudeep Krystal Eaton, MD  atorvastatin (LIPITOR) 10 MG tablet Take 10 mg by mouth every evening.    Historical Provider, MD  Calcium Carbonate-Vitamin D (CALCIUM-VITAMIN D) 500-200 MG-UNIT tablet  Take 1 tablet by mouth daily.    Historical Provider, MD  celecoxib (CELEBREX) 200 MG capsule Take 200 mg by mouth daily.    Historical Provider, MD  cyclobenzaprine (FLEXERIL) 5 MG tablet Take 5 mg by mouth 2 (two) times daily as needed for muscle spasms.    Historical Provider, MD  enoxaparin (LOVENOX) 30 MG/0.3ML injection Inject 0.3 mLs (30 mg total) into the skin every 12 (twelve) hours. Patient not taking: Reported on 08/22/2016 07/05/16   Reche Dixon, PA-C  gabapentin (NEURONTIN) 100 MG capsule Take 100 mg by mouth 3 (three) times daily.    Historical Provider, MD  HYDROcodone-acetaminophen  (NORCO) 10-325 MG tablet Take 1-2 tablets by mouth every 4 (four) hours as needed for moderate pain. 07/05/16   Reche Dixon, PA-C  levothyroxine (SYNTHROID, LEVOTHROID) 88 MCG tablet Take 88 mcg by mouth daily before breakfast.    Historical Provider, MD  metoprolol (LOPRESSOR) 50 MG tablet Take 50 mg by mouth every evening.     Historical Provider, MD  oxyCODONE-acetaminophen (PERCOCET) 7.5-325 MG tablet Take 2 tablets by mouth every 4 (four) hours as needed for moderate pain. 07/20/16   Lattie Corns, PA-C  traMADol (ULTRAM) 50 MG tablet Take 2 tablets (100 mg total) by mouth every 6 (six) hours. 07/05/16   Reche Dixon, PA-C  venlafaxine XR (EFFEXOR-XR) 75 MG 24 hr capsule Take 3 capsules (225 mg total) by mouth daily with breakfast. 04/25/16   Max Sane, MD  Lovenox discontinued in December.     ROS: Unable to obtain due to aphasia  Physical Examination: Blood pressure (!) 152/56, pulse (!) 102, temperature 97.5 F (36.4 C), temperature source Axillary, resp. rate 18, weight 95.3 kg (210 lb), SpO2 95 %.  HEENT-  Normocephalic, no lesions, without obvious abnormality.  Normal external eye and conjunctiva.  Normal TM's bilaterally.  Normal auditory canals and external ears. Normal external nose, mucus membranes and septum.  Normal pharynx. Cardiovascular- S1, S2 normal, pulses palpable throughout   Lungs- chest clear, no wheezing, rales, normal symmetric air entry Abdomen- soft, non-tender; bowel sounds normal; no masses,  no organomegaly Extremities- no edema Lymph-no adenopathy palpable Musculoskeletal-no joint tenderness, deformity or swelling Skin-warm and dry, no hyperpigmentation, vitiligo, or suspicious lesions  Neurological Examination Mental Status: Alert.  Aphasic and unable to express her name or age.  Unable to give me information as to orientation.  Unable to follow commands.  Requires some nonverbal cues but even then has difficulty. Cranial Nerves: II: Discs flat  bilaterally; Does not blink to confrontation from the right, pupils equal, round, reactive to light and accommodation III,IV, VI: ptosis not present, extra-ocular motions grossly intact bilaterally V,VII: smile symmetric, facial light touch sensation normal bilaterally VIII: hearing normal bilaterally IX,X: gag reflex present XI: bilateral shoulder shrug XII: midline tongue extension Motor: Right : Upper extremity   5/5       Left:     Upper extremity   5/5  Lower extremity   Patient has drift.  Unable to test formally   Lower extremity   5/5 Tone and bulk:normal tone throughout; no atrophy noted Sensory: Responds to noxious stimuli throughout Deep Tendon Reflexes: 2+ with exclusion of right KJ which is absent Plantars: Right: mute   Left: mute Cerebellar: Unable to perform secondary to ability to follow commands Gait: Unsteady    Laboratory Studies:  Basic Metabolic Panel:  Recent Labs Lab 08/22/16 1145  NA 136  K 5.1  CL 98*  CO2 27  GLUCOSE 105*  BUN 10  CREATININE 0.89  CALCIUM 9.6    Liver Function Tests:  Recent Labs Lab 08/22/16 1145  AST 22  ALT 8*  ALKPHOS 102  BILITOT 0.5  PROT 8.6*  ALBUMIN 3.7   No results for input(s): LIPASE, AMYLASE in the last 168 hours. No results for input(s): AMMONIA in the last 168 hours.  CBC:  Recent Labs Lab 08/22/16 1145  WBC 9.0  NEUTROABS 6.8*  HGB 11.7*  HCT 35.2  MCV 79.6*  PLT 315    Cardiac Enzymes:  Recent Labs Lab 08/22/16 1145  TROPONINI <0.03    BNP: Invalid input(s): POCBNP  CBG:  Recent Labs Lab 08/22/16 1131  GLUCAP 123*    Microbiology: Results for orders placed or performed during the hospital encounter of 07/17/16  Blood culture (routine x 2)     Status: Abnormal   Collection Time: 07/17/16  9:49 PM  Result Value Ref Range Status   Specimen Description BLOOD  R FA   Final   Special Requests   Final    BOTTLES DRAWN AEROBIC AND ANAEROBIC  AER 5 ML ANA 0 ML   Culture   Setup Time   Final    GRAM POSITIVE COCCI AEROBIC BOTTLE ONLY CRITICAL RESULT CALLED TO, READ BACK BY AND VERIFIED WITH: JASON ROBBINS 07/18/16 1700 KLW    Culture (A)  Final    STAPHYLOCOCCUS AUREUS SUSCEPTIBILITIES PERFORMED ON PREVIOUS CULTURE WITHIN THE LAST 5 DAYS. Performed at [facility]    Report Status 07/21/2016 FINAL  Final  Blood culture (routine x 2)     Status: Abnormal   Collection Time: 07/17/16  9:51 PM  Result Value Ref Range Status   Specimen Description BLOOD  R AC   Final   Special Requests   Final    BOTTLES DRAWN AEROBIC AND ANAEROBIC  AER 9 ML ANA 10 ML   Culture  Setup Time   Final    Organism ID to follow GRAM POSITIVE COCCI ANAEROBIC BOTTLE ONLY CRITICAL RESULT CALLED TO, READ BACK BY AND VERIFIED WITH: KAREN HAYES @ [id number] 07/18/2016 JLJ IN BOTH AEROBIC AND ANAEROBIC BOTTLES    Culture STAPHYLOCOCCUS AUREUS (A)  Final   Report Status 07/21/2016 FINAL  Final   Organism ID, Bacteria STAPHYLOCOCCUS AUREUS  Final      Susceptibility   Staphylococcus aureus - MIC*    CIPROFLOXACIN <=0.5 SENSITIVE Sensitive     ERYTHROMYCIN <=0.25 SENSITIVE Sensitive     GENTAMICIN <=0.5 SENSITIVE Sensitive     OXACILLIN 0.5 SENSITIVE Sensitive     TETRACYCLINE <=1 SENSITIVE Sensitive     VANCOMYCIN <=0.5 SENSITIVE Sensitive     TRIMETH/SULFA <=10 SENSITIVE Sensitive     CLINDAMYCIN <=0.25 SENSITIVE Sensitive     RIFAMPIN <=0.5 SENSITIVE Sensitive     Inducible Clindamycin NEGATIVE Sensitive     * STAPHYLOCOCCUS AUREUS  Blood Culture ID Panel (Reflexed)     Status: Abnormal   Collection Time: 07/17/16  9:51 PM  Result Value Ref Range Status   Enterococcus species NOT DETECTED NOT DETECTED Final   Listeria monocytogenes NOT DETECTED NOT DETECTED Final   Staphylococcus species DETECTED (A) NOT DETECTED Final    Comment: CRITICAL RESULT CALLED TO, READ BACK BY AND VERIFIED WITH: KAREN HAYES @ 1515 07/18/2016 JLJ    Staphylococcus aureus DETECTED (A) NOT DETECTED  Final    Comment: CRITICAL RESULT CALLED TO, READ BACK BY AND VERIFIED WITH: KAREN HAYES @ 1515 07/18/2016 JLJ  Methicillin resistance NOT DETECTED NOT DETECTED Final   Streptococcus species NOT DETECTED NOT DETECTED Final   Streptococcus agalactiae NOT DETECTED NOT DETECTED Final   Streptococcus pneumoniae NOT DETECTED NOT DETECTED Final   Streptococcus pyogenes NOT DETECTED NOT DETECTED Final   Acinetobacter baumannii NOT DETECTED NOT DETECTED Final   Enterobacteriaceae species NOT DETECTED NOT DETECTED Final   Enterobacter cloacae complex NOT DETECTED NOT DETECTED Final   Escherichia coli NOT DETECTED NOT DETECTED Final   Klebsiella oxytoca NOT DETECTED NOT DETECTED Final   Klebsiella pneumoniae NOT DETECTED NOT DETECTED Final   Proteus species NOT DETECTED NOT DETECTED Final   Serratia marcescens NOT DETECTED NOT DETECTED Final   Haemophilus influenzae NOT DETECTED NOT DETECTED Final   Neisseria meningitidis NOT DETECTED NOT DETECTED Final   Pseudomonas aeruginosa NOT DETECTED NOT DETECTED Final   Candida albicans NOT DETECTED NOT DETECTED Final   Candida glabrata NOT DETECTED NOT DETECTED Final   Candida krusei NOT DETECTED NOT DETECTED Final   Candida parapsilosis NOT DETECTED NOT DETECTED Final   Candida tropicalis NOT DETECTED NOT DETECTED Final  MRSA PCR Screening     Status: None   Collection Time: 07/18/16  1:53 AM  Result Value Ref Range Status   MRSA by PCR NEGATIVE NEGATIVE Final    Comment:        The GeneXpert MRSA Assay (FDA approved for NASAL specimens only), is one component of a comprehensive MRSA colonization surveillance program. It is not intended to diagnose MRSA infection nor to guide or monitor treatment for MRSA infections.   Urine culture     Status: Abnormal   Collection Time: 07/18/16  4:25 AM  Result Value Ref Range Status   Specimen Description URINE, RANDOM  Final   Special Requests NONE  Final   Culture MULTIPLE SPECIES PRESENT, SUGGEST  RECOLLECTION (A)  Final   Report Status 07/19/2016 FINAL  Final  CULTURE, BLOOD (ROUTINE X 2) w Reflex to ID Panel     Status: None   Collection Time: 07/19/16 10:25 AM  Result Value Ref Range Status   Specimen Description BLOOD LEFT AC  Final   Special Requests   Final    BOTTLES DRAWN AEROBIC AND ANAEROBIC ANA 12ML AER 11ML   Culture NO GROWTH 5 DAYS  Final   Report Status 07/24/2016 FINAL  Final  CULTURE, BLOOD (ROUTINE X 2) w Reflex to ID Panel     Status: None   Collection Time: 07/19/16  2:22 PM  Result Value Ref Range Status   Specimen Description BLOOD LEFT FATTY CASTS  Final   Special Requests   Final    BOTTLES DRAWN AEROBIC AND ANAEROBIC 10MLAERO,11MLANA   Culture NO GROWTH 5 DAYS  Final   Report Status 07/24/2016 FINAL  Final    Coagulation Studies:  Recent Labs  08/22/16 1145  LABPROT 13.7  INR 1.05    Urinalysis: No results for input(s): COLORURINE, LABSPEC, PHURINE, GLUCOSEU, HGBUR, BILIRUBINUR, KETONESUR, PROTEINUR, UROBILINOGEN, NITRITE, LEUKOCYTESUR in the last 168 hours.  Invalid input(s): APPERANCEUR  Lipid Panel: No results found for: CHOL, TRIG, HDL, CHOLHDL, VLDL, LDLCALC  HgbA1C: No results found for: HGBA1C  Urine Drug Screen:  No results found for: LABOPIA, COCAINSCRNUR, LABBENZ, AMPHETMU, THCU, LABBARB  Alcohol Level: No results for input(s): ETH in the last 168 hours.  Other results: EKG: normal sinus rhythm at 100 bpm.  Imaging: Ct Head Code Stroke W/o Cm  Result Date: 08/22/2016 CLINICAL DATA:  Code stroke.  Speech change.  Weakness. EXAM: CT HEAD WITHOUT CONTRAST TECHNIQUE: Contiguous axial images were obtained from the base of the skull through the vertex without intravenous contrast. COMPARISON:  CT head 08/02/2016 FINDINGS: Brain: Negative for acute infarct. Negative for hemorrhage or mass. Mild chronic microvascular ischemic change in the white matter. Vascular: Atherosclerotic calcification in the distal right M1 segment unchanged from  the prior study. Negative for hyperdense MCA. Skull: Negative Sinuses/Orbits: Negative Other: None ASPECTS ([place] Stroke Program Early CT Score) - Ganglionic level infarction (caudate, lentiform nuclei, internal capsule, insula, M1-M3 cortex): 7 - Supraganglionic infarction (M4-M6 cortex): 3 Total score (0-10 with 10 being normal): 10 IMPRESSION: 1. No acute intracranial abnormality. 2. ASPECTS is 10 These results were called by telephone at the time of interpretation on 08/22/2016 at 11:34 am to Dr. Joni Fears, who verbally acknowledged these results. 3. Atherosclerotic calcification distal right M1 segment. Electronically Signed   By: Franchot Gallo M.D.   On: 08/22/2016 11:36    Assessment: 76 y.o. female presenting with evidence of an expressive and receptive aphasia.  Possible visual deficits and RLE weakness as well.  Head CT reviewed and shows no acute changes.  Acute infarct suspected.  Patient's condition explained to both daughters.  Contraindications reviewed.  Risks and benefits discussed.  Daughters required additional time and discussion before they were comfortable with tPA administration, delaying administration to some degree.  Verbal consent obtained and tPA administered.  Event not felt to be large vessel.  Transfer to be arranged.  Family prefers [facility].  Stroke Risk Factors - hyperlipidemia, hypertension and smoking  Plan: 1. HgbA1c, fasting lipid panel 2. MRI, MRA  of the brain without contrast 3. PT consult, OT consult, Speech consult 4. Echocardiogram 5. Carotid dopplers 6. Prophylactic therapy-None 7. NPO until RN stroke swallow screen 8. Telemetry monitoring 9. Frequent neuro checks 10. Transfer discussed with Dr. Joni Fears   This patient is critically ill and at significant risk of neurological worsening, death and care requires constant monitoring of vital signs, hemodynamics,respiratory and cardiac monitoring, neurological assessment, discussion with family, other  specialists and medical decision making of high complexity. I spent 80 minutes of neurocritical care time  in the care of  this patient.   Alexis Goodell, MD Neurology [phone] 08/22/2016, 12:23 PM

## 2016-08-22 NOTE — ED Notes (Signed)
Peyton Najjar RN and this Probation officer as witness wasted 14mg  of TPA from bottle.

## 2016-08-22 NOTE — ED Notes (Signed)
Deferring swallow screen due to inability to follow commands and slurred speech. Patient was given lemon gylcerin swab and tolerated it well.

## 2016-08-22 NOTE — Progress Notes (Signed)
OT Cancellation Note  Patient Details Name: [name] Douglas MRN: [id number] DOB: 06-01-1941   Cancelled Treatment:    Reason Eval/Treat Not Completed: Patient not medically ready (s/p TPA with BR orders)  Parke Poisson B 08/22/2016, 4:00 PM

## 2016-08-22 NOTE — ED Notes (Signed)
Patient left for CT accompanied by this Probation officer.

## 2016-08-22 NOTE — H&P (Signed)
History and physical      History obtained from:  Chart  As patient is both receptive and expressive aphasic HPI:                                                                                                                                         [name] Douglas is an 76 y.o. female his multiple medical problems who has been under the care of [facility] for postoperative infection of her knee. Patient apparently complained of a headache last evening but was relieved with Tylenol. This a.m. she went to go to the doctor's appointment and when she spoke with her ex-husband she was at baseline. When he came to pick her up at 1025 she had slurred speech. She was then taken to the doctor who sent her to the ED. Upon entering ED she had an NIH stroke scale of 9 and was both expressively and receptively a phasic. Patient received TPA and was sent to [facility] for further evaluation and care at the ICU. On arrival patient currently is both receptively and expressively a phasic.  While in the ICU patient was noted to have bouts of emesis which was concerning for possible intracranial abnormalities and a stat CT was ordered.   Date last known well: Date: 08/22/2016 Time last known well: Time: 09:00 tPA Given: Yes    Past Medical History:  Diagnosis Date  . Anxiety   . Arthritis    "shoulders" (08/20/2014)  . Asthma   . Chronic lower back pain   . Complication of anesthesia    "agitated & restless after knee replacement in 2008"  . COPD (chronic obstructive pulmonary disease) ([place])   . DDD (degenerative disc disease), lumbar   . Depression   . Diverticulosis   . GERD (gastroesophageal reflux disease)   . High cholesterol   . Hypertension   . Hypothyroidism   . On home oxygen therapy    "2L at night" (08/20/2014)  . Shortness of breath dyspnea    with exertion    Past Surgical History:  Procedure Laterality Date  . CARDIAC CATHETERIZATION  ~ 2007   . EXCISIONAL HEMORRHOIDECTOMY  1960's  . EYE SURGERY Bilateral    Cataract extraction with IOL  . JOINT REPLACEMENT Left 2009  . KNEE ARTHROSCOPY Left X 2 <2008  . KNEE ARTHROSCOPY WITH SUBCHONDROPLASTY Right 04/19/2016   Procedure: KNEE ARTHROSCOPY WITH SUBCHONDROPLASTY, PARTIAL MENISCECTOMY;  Surgeon: Hessie Knows, MD;  Location: [facility] ORS;  Service: Orthopedics;  Laterality: Right;  . SHOULDER OPEN ROTATOR CUFF REPAIR Right 2001  . TONSILLECTOMY  ~ 1950  . TOTAL KNEE ARTHROPLASTY Left 2008  . TOTAL KNEE ARTHROPLASTY Right 07/03/2016   Procedure: TOTAL KNEE ARTHROPLASTY;  Surgeon: Hessie Knows, MD;  Location: [facility] ORS;  Service: Orthopedics;  Laterality: Right;  . TUBAL LIGATION  1973  . VAGINAL HYSTERECTOMY  1974  Family History  Problem Relation Age of Onset  . Diabetes Mother   . Hypertension Mother   . Lung cancer Mother   . Congestive Heart Failure Father   . Congestive Heart Failure Sister    Social History:  reports that she has been smoking Cigarettes.  She has a 26.50 pack-year smoking history. She has never used smokeless tobacco. She reports that she does not drink alcohol or use drugs.  Allergies: No Known Allergies  Medications:                                                                                                                           Prior to Admission:  Prescriptions Prior to Admission  Medication Sig Dispense Refill Last Dose  . albuterol (PROVENTIL HFA;VENTOLIN HFA) 108 (90 BASE) MCG/ACT inhaler Inhale 2 puffs into the lungs every 6 (six) hours as needed for wheezing or shortness of breath. 1 Inhaler 2 07/02/2016 at Unknown time  . albuterol (PROVENTIL) (2.5 MG/3ML) 0.083% nebulizer solution Take 3 mLs (2.5 mg total) by nebulization 2 (two) times daily. (Patient taking differently: Take 2.5 mg by nebulization as needed. ) 75 mL 12 07/01/2016  . atorvastatin (LIPITOR) 10 MG tablet Take 10 mg by mouth every evening.   07/17/2016 at Unknown time  .  Calcium Carbonate-Vitamin D (CALCIUM-VITAMIN D) 500-200 MG-UNIT tablet Take 1 tablet by mouth daily.   07/17/2016 at Unknown time  . celecoxib (CELEBREX) 200 MG capsule Take 200 mg by mouth daily.   Past Week at Unknown time  . cyclobenzaprine (FLEXERIL) 5 MG tablet Take 5 mg by mouth 2 (two) times daily as needed for muscle spasms.     Marland Kitchen enoxaparin (LOVENOX) 30 MG/0.3ML injection Inject 0.3 mLs (30 mg total) into the skin every 12 (twelve) hours. (Patient not taking: Reported on 08/22/2016) 14 Syringe 0 Not Taking at Unknown time  . gabapentin (NEURONTIN) 100 MG capsule Take 100 mg by mouth 3 (three) times daily.   07/17/2016 at Unknown time  . HYDROcodone-acetaminophen (NORCO) 10-325 MG tablet Take 1-2 tablets by mouth every 4 (four) hours as needed for moderate pain. 30 tablet 0 07/17/2016 at Unknown time  . levothyroxine (SYNTHROID, LEVOTHROID) 88 MCG tablet Take 88 mcg by mouth daily before breakfast.   07/17/2016 at Unknown time  . metoprolol (LOPRESSOR) 50 MG tablet Take 50 mg by mouth every evening.    07/17/2016 at Unknown time  . oxyCODONE-acetaminophen (PERCOCET) 7.5-325 MG tablet Take 2 tablets by mouth every 4 (four) hours as needed for moderate pain. 60 tablet 0   . traMADol (ULTRAM) 50 MG tablet Take 2 tablets (100 mg total) by mouth every 6 (six) hours. 30 tablet 1 Past Week at Unknown time  . venlafaxine XR (EFFEXOR-XR) 75 MG 24 hr capsule Take 3 capsules (225 mg total) by mouth daily with breakfast. 30 capsule 0 07/17/2016 at Unknown time   Scheduled: .  stroke: mapping our early stages of recovery book  Does not apply Once  . atorvastatin  10 mg Oral QPM  . gabapentin  100 mg Oral TID  . labetalol  20 mg Intravenous Once  . [START ON 08/23/2016] levothyroxine  88 mcg Oral QAC breakfast  . metoprolol  50 mg Oral QPM  . pantoprazole (PROTONIX) IV  40 mg Intravenous QHS  . [START ON 08/23/2016] venlafaxine XR  225 mg Oral Q breakfast    ROS:                                                                                                                                        History obtained from unobtainable from patient due to Expressive and receptive aphasia    Neurologic Examination:                                                                                                      Blood pressure 130/69, pulse (!) 105, resp. rate 20, SpO2 96 %.  HEENT-  Normocephalic, no lesions, without obvious abnormality.  Normal external eye and conjunctiva.  Normal TM's bilaterally.  Normal auditory canals and external ears. Normal external nose, mucus membranes and septum.  Normal pharynx. Cardiovascular- S1, S2 normal, pulses palpable throughout   Lungs- chest clear, no wheezing, rales, normal symmetric air entry Abdomen- normal findings: bowel sounds normal Extremities- no edema Lymph-no adenopathy palpable Musculoskeletal-no joint tenderness, deformity or swelling Skin-warm and dry, no hyperpigmentation, vitiligo, or suspicious lesions  Neurological Examination Mental Status: Alert, oriented, thought content appropriate.  Speech fluent without evidence of aphasia.  Able to follow 3 step commands without difficulty. Cranial Nerves: II: Discs flat bilaterally; Visual fields grossly normal,  III,IV, VI: ptosis not present, extra-ocular motions intact bilaterally, pupils equal, round, reactive to light and accommodation V,VII: smile symmetric, facial light touch sensation normal bilaterally VIII: hearing normal bilaterally IX,X: uvula rises symmetrically XI: bilateral shoulder shrug XII: midline tongue extension Motor: Right : Upper extremity   5/5    Left:     Upper extremity   5/5  Lower extremity   5/5     Lower extremity   5/5 Tone and bulk:normal tone throughout; no atrophy noted Sensory: Pinprick and light touch intact throughout, bilaterally Deep Tendon Reflexes: 2+ and symmetric throughout Plantars: Right: downgoing   Left: downgoing Cerebellar: normal  finger-to-nose, normal rapid alternating movements and normal heel-to-shin test Gait: normal gait and station       Lab Results: Basic Metabolic Panel:  Recent Labs Lab 08/22/16  1145  NA 136  K 5.1  CL 98*  CO2 27  GLUCOSE 105*  BUN 10  CREATININE 0.89  CALCIUM 9.6    Liver Function Tests:  Recent Labs Lab 08/22/16 1145  AST 22  ALT 8*  ALKPHOS 102  BILITOT 0.5  PROT 8.6*  ALBUMIN 3.7   No results for input(s): LIPASE, AMYLASE in the last 168 hours. No results for input(s): AMMONIA in the last 168 hours.  CBC:  Recent Labs Lab 08/22/16 1145  WBC 9.0  NEUTROABS 6.8*  HGB 11.7*  HCT 35.2  MCV 79.6*  PLT 315    Cardiac Enzymes:  Recent Labs Lab 08/22/16 1145  TROPONINI <0.03    Lipid Panel: No results for input(s): CHOL, TRIG, HDL, CHOLHDL, VLDL, LDLCALC in the last 168 hours.  CBG:  Recent Labs Lab 08/22/16 [place]*    Microbiology: Results for orders placed or performed during the hospital encounter of 07/17/16  Blood culture (routine x 2)     Status: Abnormal   Collection Time: 07/17/16  9:49 PM  Result Value Ref Range Status   Specimen Description BLOOD  R FA   Final   Special Requests   Final    BOTTLES DRAWN AEROBIC AND ANAEROBIC  AER 5 ML ANA 0 ML   Culture  Setup Time   Final    GRAM POSITIVE COCCI AEROBIC BOTTLE ONLY CRITICAL RESULT CALLED TO, READ BACK BY AND VERIFIED WITH: JASON ROBBINS 07/18/16 1700 KLW    Culture (A)  Final    STAPHYLOCOCCUS AUREUS SUSCEPTIBILITIES PERFORMED ON PREVIOUS CULTURE WITHIN THE LAST 5 DAYS. Performed at [facility]    Report Status 07/21/2016 FINAL  Final  Blood culture (routine x 2)     Status: Abnormal   Collection Time: 07/17/16  9:51 PM  Result Value Ref Range Status   Specimen Description BLOOD  R AC   Final   Special Requests   Final    BOTTLES DRAWN AEROBIC AND ANAEROBIC  AER 9 ML ANA 10 ML   Culture  Setup Time   Final    Organism ID to follow GRAM POSITIVE  COCCI ANAEROBIC BOTTLE ONLY CRITICAL RESULT CALLED TO, READ BACK BY AND VERIFIED WITH: KAREN HAYES @ [id number] 07/18/2016 JLJ IN BOTH AEROBIC AND ANAEROBIC BOTTLES    Culture STAPHYLOCOCCUS AUREUS (A)  Final   Report Status 07/21/2016 FINAL  Final   Organism ID, Bacteria STAPHYLOCOCCUS AUREUS  Final      Susceptibility   Staphylococcus aureus - MIC*    CIPROFLOXACIN <=0.5 SENSITIVE Sensitive     ERYTHROMYCIN <=0.25 SENSITIVE Sensitive     GENTAMICIN <=0.5 SENSITIVE Sensitive     OXACILLIN 0.5 SENSITIVE Sensitive     TETRACYCLINE <=1 SENSITIVE Sensitive     VANCOMYCIN <=0.5 SENSITIVE Sensitive     TRIMETH/SULFA <=10 SENSITIVE Sensitive     CLINDAMYCIN <=0.25 SENSITIVE Sensitive     RIFAMPIN <=0.5 SENSITIVE Sensitive     Inducible Clindamycin NEGATIVE Sensitive     * STAPHYLOCOCCUS AUREUS  Blood Culture ID Panel (Reflexed)     Status: Abnormal   Collection Time: 07/17/16  9:51 PM  Result Value Ref Range Status   Enterococcus species NOT DETECTED NOT DETECTED Final   Listeria monocytogenes NOT DETECTED NOT DETECTED Final   Staphylococcus species DETECTED (A) NOT DETECTED Final    Comment: CRITICAL RESULT CALLED TO, READ BACK BY AND VERIFIED WITH: KAREN HAYES @ 1515 07/18/2016 JLJ    Staphylococcus aureus DETECTED (  A) NOT DETECTED Final    Comment: CRITICAL RESULT CALLED TO, READ BACK BY AND VERIFIED WITH: KAREN HAYES @ [id number] 07/18/2016 JLJ    Methicillin resistance NOT DETECTED NOT DETECTED Final   Streptococcus species NOT DETECTED NOT DETECTED Final   Streptococcus agalactiae NOT DETECTED NOT DETECTED Final   Streptococcus pneumoniae NOT DETECTED NOT DETECTED Final   Streptococcus pyogenes NOT DETECTED NOT DETECTED Final   Acinetobacter baumannii NOT DETECTED NOT DETECTED Final   Enterobacteriaceae species NOT DETECTED NOT DETECTED Final   Enterobacter cloacae complex NOT DETECTED NOT DETECTED Final   Escherichia coli NOT DETECTED NOT DETECTED Final   Klebsiella oxytoca NOT DETECTED  NOT DETECTED Final   Klebsiella pneumoniae NOT DETECTED NOT DETECTED Final   Proteus species NOT DETECTED NOT DETECTED Final   Serratia marcescens NOT DETECTED NOT DETECTED Final   Haemophilus influenzae NOT DETECTED NOT DETECTED Final   Neisseria meningitidis NOT DETECTED NOT DETECTED Final   Pseudomonas aeruginosa NOT DETECTED NOT DETECTED Final   Candida albicans NOT DETECTED NOT DETECTED Final   Candida glabrata NOT DETECTED NOT DETECTED Final   Candida krusei NOT DETECTED NOT DETECTED Final   Candida parapsilosis NOT DETECTED NOT DETECTED Final   Candida tropicalis NOT DETECTED NOT DETECTED Final  MRSA PCR Screening     Status: None   Collection Time: 07/18/16  1:53 AM  Result Value Ref Range Status   MRSA by PCR NEGATIVE NEGATIVE Final    Comment:        The GeneXpert MRSA Assay (FDA approved for NASAL specimens only), is one component of a comprehensive MRSA colonization surveillance program. It is not intended to diagnose MRSA infection nor to guide or monitor treatment for MRSA infections.   Urine culture     Status: Abnormal   Collection Time: 07/18/16  4:25 AM  Result Value Ref Range Status   Specimen Description URINE, RANDOM  Final   Special Requests NONE  Final   Culture MULTIPLE SPECIES PRESENT, SUGGEST RECOLLECTION (A)  Final   Report Status 07/19/2016 FINAL  Final  CULTURE, BLOOD (ROUTINE X 2) w Reflex to ID Panel     Status: None   Collection Time: 07/19/16 10:25 AM  Result Value Ref Range Status   Specimen Description BLOOD LEFT AC  Final   Special Requests   Final    BOTTLES DRAWN AEROBIC AND ANAEROBIC ANA 12ML AER 11ML   Culture NO GROWTH 5 DAYS  Final   Report Status 07/24/2016 FINAL  Final  CULTURE, BLOOD (ROUTINE X 2) w Reflex to ID Panel     Status: None   Collection Time: 07/19/16  2:22 PM  Result Value Ref Range Status   Specimen Description BLOOD LEFT FATTY CASTS  Final   Special Requests   Final    BOTTLES DRAWN AEROBIC AND ANAEROBIC  10MLAERO,11MLANA   Culture NO GROWTH 5 DAYS  Final   Report Status 07/24/2016 FINAL  Final    Coagulation Studies:  Recent Labs  08/22/16 1145  LABPROT 13.7  INR 1.05    Imaging: Ct Angio Head W Or Wo Contrast  Addendum Date: 08/22/2016   ADDENDUM REPORT: 08/22/2016 14:54 ADDENDUM: #5 discussed by telephone with Dr. Doren Custard STAFFORD on 08/22/2016 At 1441 hours. Electronically Signed   By: Genevie Ann M.D.   On: 08/22/2016 14:54   Result Date: 08/22/2016 CLINICAL DATA:  76 y/o female code stroke. Progressive symptoms since presentation. Abnormal speech beginning at 1025 hours. Right lower extremity weakness and confusion. EXAM:  CT ANGIOGRAPHY HEAD AND NECK TECHNIQUE: Multidetector CT imaging of the head and neck was performed using the standard protocol during bolus administration of intravenous contrast. Multiplanar CT image reconstructions and MIPs were obtained to evaluate the vascular anatomy. Carotid stenosis measurements (when applicable) are obtained utilizing NASCET criteria, using the distal internal carotid diameter as the denominator. CONTRAST:  75 mL Isovue 370 COMPARISON:  Head CT without contrast 1123 hours today. Chest CT 08/02/2014. FINDINGS: CTA NECK Skeleton: Absent dentition. Degenerative changes in the cervical spine. No acute osseous abnormality identified. Visualized paranasal sinuses and mastoids are stable and well pneumatized. Upper chest: Peripheral reticular pulmonary opacity in the upper lungs. Left upper lobe 12 mm lung nodule is new since 2015. Mild dilatation of the thoracic esophagus with a small fluid level. Right peritracheal lymph node measuring 9 mm short axis appears increased since 2015. Stable small right thoracic inlet/prevascular lymph node on series 5, image 33. Other neck: Diminutive thyroid. Larynx, pharynx, parapharyngeal spaces, retropharyngeal space (partially retropharyngeal course of both carotids), sublingual space, submandibular glands and parotid glands  are within normal limits. No cervical lymphadenopathy. Aortic arch: 3 vessel arch configuration. Mild to moderate calcified arch atherosclerosis. Right carotid system: No brachiocephalic artery or right CCA origin stenosis despite calcified plaque. Calcified plaque continues in the proximal right CCA which is mildly tortuous. Somewhat early right carotid bifurcation occurring at the level of the larynx. Soft and calcified plaque at the right ICA origin and bulb with no stenosis. Negative cervical right ICA otherwise, partially retropharyngeal. Left carotid system: No left CCA origin despite mild calcified plaque. Bulky calcified plaque along the anterior proximal left CCA is not hemodynamically significant. Left carotid bifurcation at the level of the larynx with soft and calcified atherosclerosis in the left ICA origin. Stenosis is less than 50 % with respect to the distal vessel. Tortuous cervical left ICA with a partially retropharyngeal course, and a kinked appearance at the C2 level (Series 11, image 26). Vertebral arteries:No proximal right subclavian artery stenosis despite soft and calcified plaque. Calcified plaque at the right vertebral artery origin with moderate to severe stenosis. Otherwise the cervical right vertebral artery is negative. Left subclavian artery origins stenosis up to 63 % with respect to the distal vessel related to origin calcified plaque. Calcified plaque at the left vertebral artery origin with mild to moderate stenosis (series 8, image 160). Both vertebral arteries are somewhat dolichoectatic. The left is otherwise negative to the skullbase. CTA HEAD Posterior circulation: Fairly codominant distal vertebral arteries. Normal right PICA origin. Fenestrated vertebrobasilar junction (normal variant). No distal vertebral or basilar stenosis, with mild dolichoectasia throughout. AICA, SCA and PCA origins are within normal limits. Posterior communicating arteries are diminutive or absent.  Bilateral PCA branches are normal. Anterior circulation: Both ICA siphons are patent. Mild for age siphon calcified plaque without stenosis. Mild dolichoectasia of the left siphon. Normal ophthalmic artery origins. Patent carotid termini. Normal MCA and left ACA origins. Dominant left A1 segment. Diminutive right A1. Anterior communicating artery and visualized ACA branches are normal aside from mild ectasia. Left MCA M1 segment, bifurcation, and left MCA branches are normal aside from mild ectasia. Right MCA M1 segment is patent with calcified plaque distally which does not appear hemodynamically significant. Mild right MCA ectasia. The right MCA bifurcation is patent. There is moderate irregularity at the M2 to M3 branching of the dominant right MCA posterior division. A superior right M3 branch then demonstrates severe stenosis. See series 12, image 14. However, no  right MCA branch occlusion is identified. Venous sinuses: Patent. Anatomic variants: Fenestrated vertebrobasilar junction. Dominant left and diminutive right ACA A1 segments. Review of the MIP images confirms the above findings IMPRESSION: 1. Negative for emergent large vessel occlusion. No significant carotid or left MCA stenosis. Generalized intracranial artery dolichoectasia. 2. Positive for moderate to severe distal right MCA posterior division (distal M2 and proximal M3) irregularity and stenosis. 3. Moderate to severe stenoses at both vertebral artery origins. 60-65% stenosis at the left subclavian artery origin. 4. The above was discussed by telephone with Dr. Roland Rack at 1433 hours. on 08/22/2016. 5. Chronic lung disease. New 12 mm left upper lobe pulmonary nodule since a chest CT in December 2015. Consider one of the following in 3 months (a) repeat chest CT, (b) follow-up PET-CT, or (c) tissue sampling. This recommendation follows the consensus statement: Guidelines for Management of Incidental Pulmonary Nodules Detected on CT  Images: From the Fleischner Society 2017; Radiology 2017; 284:228-243. Electronically Signed: By: Genevie Ann M.D. On: 08/22/2016 14:37   Ct Angio Neck W And/or Wo Contrast  Addendum Date: 08/22/2016   ADDENDUM REPORT: 08/22/2016 14:54 ADDENDUM: #5 discussed by telephone with Dr. Doren Custard STAFFORD on 08/22/2016 At 1441 hours. Electronically Signed   By: Genevie Ann M.D.   On: 08/22/2016 14:54   Result Date: 08/22/2016 CLINICAL DATA:  76 y/o female code stroke. Progressive symptoms since presentation. Abnormal speech beginning at 1025 hours. Right lower extremity weakness and confusion. EXAM: CT ANGIOGRAPHY HEAD AND NECK TECHNIQUE: Multidetector CT imaging of the head and neck was performed using the standard protocol during bolus administration of intravenous contrast. Multiplanar CT image reconstructions and MIPs were obtained to evaluate the vascular anatomy. Carotid stenosis measurements (when applicable) are obtained utilizing NASCET criteria, using the distal internal carotid diameter as the denominator. CONTRAST:  75 mL Isovue 370 COMPARISON:  Head CT without contrast 1123 hours today. Chest CT 08/02/2014. FINDINGS: CTA NECK Skeleton: Absent dentition. Degenerative changes in the cervical spine. No acute osseous abnormality identified. Visualized paranasal sinuses and mastoids are stable and well pneumatized. Upper chest: Peripheral reticular pulmonary opacity in the upper lungs. Left upper lobe 12 mm lung nodule is new since 2015. Mild dilatation of the thoracic esophagus with a small fluid level. Right peritracheal lymph node measuring 9 mm short axis appears increased since 2015. Stable small right thoracic inlet/prevascular lymph node on series 5, image 33. Other neck: Diminutive thyroid. Larynx, pharynx, parapharyngeal spaces, retropharyngeal space (partially retropharyngeal course of both carotids), sublingual space, submandibular glands and parotid glands are within normal limits. No cervical lymphadenopathy.  Aortic arch: 3 vessel arch configuration. Mild to moderate calcified arch atherosclerosis. Right carotid system: No brachiocephalic artery or right CCA origin stenosis despite calcified plaque. Calcified plaque continues in the proximal right CCA which is mildly tortuous. Somewhat early right carotid bifurcation occurring at the level of the larynx. Soft and calcified plaque at the right ICA origin and bulb with no stenosis. Negative cervical right ICA otherwise, partially retropharyngeal. Left carotid system: No left CCA origin despite mild calcified plaque. Bulky calcified plaque along the anterior proximal left CCA is not hemodynamically significant. Left carotid bifurcation at the level of the larynx with soft and calcified atherosclerosis in the left ICA origin. Stenosis is less than 50 % with respect to the distal vessel. Tortuous cervical left ICA with a partially retropharyngeal course, and a kinked appearance at the C2 level (Series 11, image 26). Vertebral arteries:No proximal right subclavian artery stenosis despite  soft and calcified plaque. Calcified plaque at the right vertebral artery origin with moderate to severe stenosis. Otherwise the cervical right vertebral artery is negative. Left subclavian artery origins stenosis up to 63 % with respect to the distal vessel related to origin calcified plaque. Calcified plaque at the left vertebral artery origin with mild to moderate stenosis (series 8, image 160). Both vertebral arteries are somewhat dolichoectatic. The left is otherwise negative to the skullbase. CTA HEAD Posterior circulation: Fairly codominant distal vertebral arteries. Normal right PICA origin. Fenestrated vertebrobasilar junction (normal variant). No distal vertebral or basilar stenosis, with mild dolichoectasia throughout. AICA, SCA and PCA origins are within normal limits. Posterior communicating arteries are diminutive or absent. Bilateral PCA branches are normal. Anterior  circulation: Both ICA siphons are patent. Mild for age siphon calcified plaque without stenosis. Mild dolichoectasia of the left siphon. Normal ophthalmic artery origins. Patent carotid termini. Normal MCA and left ACA origins. Dominant left A1 segment. Diminutive right A1. Anterior communicating artery and visualized ACA branches are normal aside from mild ectasia. Left MCA M1 segment, bifurcation, and left MCA branches are normal aside from mild ectasia. Right MCA M1 segment is patent with calcified plaque distally which does not appear hemodynamically significant. Mild right MCA ectasia. The right MCA bifurcation is patent. There is moderate irregularity at the M2 to M3 branching of the dominant right MCA posterior division. A superior right M3 branch then demonstrates severe stenosis. See series 12, image 14. However, no right MCA branch occlusion is identified. Venous sinuses: Patent. Anatomic variants: Fenestrated vertebrobasilar junction. Dominant left and diminutive right ACA A1 segments. Review of the MIP images confirms the above findings IMPRESSION: 1. Negative for emergent large vessel occlusion. No significant carotid or left MCA stenosis. Generalized intracranial artery dolichoectasia. 2. Positive for moderate to severe distal right MCA posterior division (distal M2 and proximal M3) irregularity and stenosis. 3. Moderate to severe stenoses at both vertebral artery origins. 60-65% stenosis at the left subclavian artery origin. 4. The above was discussed by telephone with Dr. Roland Rack at 1433 hours. on 08/22/2016. 5. Chronic lung disease. New 12 mm left upper lobe pulmonary nodule since a chest CT in December 2015. Consider one of the following in 3 months (a) repeat chest CT, (b) follow-up PET-CT, or (c) tissue sampling. This recommendation follows the consensus statement: Guidelines for Management of Incidental Pulmonary Nodules Detected on CT Images: From the Fleischner Society 2017;  Radiology 2017; 284:228-243. Electronically Signed: By: Genevie Ann M.D. On: 08/22/2016 14:37   Ct Head Code Stroke W/o Cm  Result Date: 08/22/2016 CLINICAL DATA:  Code stroke.  Speech change.  Weakness. EXAM: CT HEAD WITHOUT CONTRAST TECHNIQUE: Contiguous axial images were obtained from the base of the skull through the vertex without intravenous contrast. COMPARISON:  CT head 08/02/2016 FINDINGS: Brain: Negative for acute infarct. Negative for hemorrhage or mass. Mild chronic microvascular ischemic change in the white matter. Vascular: Atherosclerotic calcification in the distal right M1 segment unchanged from the prior study. Negative for hyperdense MCA. Skull: Negative Sinuses/Orbits: Negative Other: None ASPECTS ([place] Stroke Program Early CT Score) - Ganglionic level infarction (caudate, lentiform nuclei, internal capsule, insula, M1-M3 cortex): 7 - Supraganglionic infarction (M4-M6 cortex): 3 Total score (0-10 with 10 being normal): 10 IMPRESSION: 1. No acute intracranial abnormality. 2. ASPECTS is 10 These results were called by telephone at the time of interpretation on 08/22/2016 at 11:34 am to Dr. Joni Fears, who verbally acknowledged these results. 3. Atherosclerotic calcification distal right M1 segment.  Electronically Signed   By: Franchot Gallo M.D.   On: 08/22/2016 11:36       Assessment and plan discussed with with attending physician and they are in agreement.    Etta Quill PA-C [facility] [phone]  08/22/2016, 3:14 PM   I have seen the patient and agree with the above note.   Assessment: 76 y.o. female transferred to [facility] hospital status post receiving TPA for symptoms of expressive and receptive aphasia consistent with acute CVA. Not a candidate for IR due to no large vessel occlusion.  While a [place] patient did show bouts of emesis which was concerning for hemorrhagic transformation and patient was sent for stat CT of brain.  Stroke Risk Factors -  hyperlipidemia and hypertension  Plan: 1. HgbA1c, fasting lipid panel 2. MRI, MRA  of the brain without contrast 3. PT consult, OT consult, Speech consult 4. Echocardiogram 5. Carotid dopplers 6. Prophylactic therapy-None 7. NPO until RN stroke swallow screen 8. Telemetry monitoring 9. Frequent neuro checks 10. Stat CT of head to evaluate for hemorrhagic transformation status post TPA administration.   Roland Rack, MD [facility] [phone]  If 7pm- 7am, please page neurology on call as listed in [place].

## 2016-08-22 NOTE — ED Notes (Addendum)
Patient began choking, raised to 30 degrees. Patient coughed up thick, white mucous x3. Patient recovered well. Patient was incontinent to urine at this time. Dr. Joni Fears and Peyton Najjar at bedside.

## 2016-08-22 NOTE — Progress Notes (Signed)
[place] Progress Note Patient Name: [name] Douglas DOB: 04/12/1941 MRN: [id number]   Date of Service  08/22/2016  HPI/Events of Note  61 aphasia at [place], ct neg, tpa given after slight delay from consenting To cone MAP 92, just went to repeat head CT- neg bleed Appears well , no distress on exam camera  eICU Interventions  Monitor BP , MAP closely neurochecks     Intervention Category Evaluation Type: New Patient Evaluation  Mithran Strike J. 08/22/2016, 3:50 PM

## 2016-08-22 NOTE — ED Provider Notes (Addendum)
[facility] Emergency Department Provider Note  ____________________________________________  Time seen: Approximately 11:58 AM  I have reviewed the triage vital signs and the nursing notes.   HISTORY  Chief Complaint Code Stroke Level 5 caveat:  Portions of the history and physical were unable to be obtained due to the patient's acute illness and altered mental status    HPI [name] Douglas is a 76 y.o. female brought to the ED due to speech difficulty and right-sided weakness. Last known well was about 9 AM today, found to have acute neurologic changes around 10:30 AM. Patient not able to relate any symptoms.     Past Medical History:  Diagnosis Date  . Anxiety   . Arthritis    "shoulders" (08/20/2014)  . Asthma   . Chronic lower back pain   . Complication of anesthesia    "agitated & restless after knee replacement in 2008"  . COPD (chronic obstructive pulmonary disease) ([place])   . DDD (degenerative disc disease), lumbar   . Depression   . Diverticulosis   . GERD (gastroesophageal reflux disease)   . High cholesterol   . Hypertension   . Hypothyroidism   . On home oxygen therapy    "2L at night" (08/20/2014)  . Shortness of breath dyspnea    with exertion     Patient Active Problem List   Diagnosis Date Noted  . Cellulitis of right knee 07/17/2016  . Primary localized osteoarthritis of right knee 07/03/2016  . Ileus ([place]) 04/24/2016  . Generalized abdominal pain   . Colitis due to Clostridium difficile   . Recurrent Clostridium difficile diarrhea   . Enteritis due to Clostridium difficile 08/22/2014  . Diarrhea 08/20/2014  . HTN (hypertension) 08/20/2014  . HLD (hyperlipidemia) 08/20/2014  . Hypothyroidism 08/20/2014  . Tobacco abuse 08/20/2014  . COPD (chronic obstructive pulmonary disease) ([place]) 08/20/2014  . Back pain 08/20/2014     Past Surgical History:  Procedure Laterality Date  . CARDIAC CATHETERIZATION  ~ 2007  .  EXCISIONAL HEMORRHOIDECTOMY  1960's  . EYE SURGERY Bilateral    Cataract extraction with IOL  . JOINT REPLACEMENT Left 2009  . KNEE ARTHROSCOPY Left X 2 <2008  . KNEE ARTHROSCOPY WITH SUBCHONDROPLASTY Right 04/19/2016   Procedure: KNEE ARTHROSCOPY WITH SUBCHONDROPLASTY, PARTIAL MENISCECTOMY;  Surgeon: Hessie Knows, MD;  Location: [facility] ORS;  Service: Orthopedics;  Laterality: Right;  . SHOULDER OPEN ROTATOR CUFF REPAIR Right 2001  . TONSILLECTOMY  ~ 1950  . TOTAL KNEE ARTHROPLASTY Left 2008  . TOTAL KNEE ARTHROPLASTY Right 07/03/2016   Procedure: TOTAL KNEE ARTHROPLASTY;  Surgeon: Hessie Knows, MD;  Location: [facility] ORS;  Service: Orthopedics;  Laterality: Right;  . TUBAL LIGATION  1973  . VAGINAL HYSTERECTOMY  1974     Prior to Admission medications   Medication Sig Start Date End Date Taking? Authorizing Provider  albuterol (PROVENTIL HFA;VENTOLIN HFA) 108 (90 BASE) MCG/ACT inhaler Inhale 2 puffs into the lungs every 6 (six) hours as needed for wheezing or shortness of breath. 08/28/14   Ripudeep Krystal Eaton, MD  albuterol (PROVENTIL) (2.5 MG/3ML) 0.083% nebulizer solution Take 3 mLs (2.5 mg total) by nebulization 2 (two) times daily. Patient taking differently: Take 2.5 mg by nebulization as needed.  08/28/14   Ripudeep Krystal Eaton, MD  atorvastatin (LIPITOR) 10 MG tablet Take 10 mg by mouth every evening.    Historical Provider, MD  Calcium Carbonate-Vitamin D (CALCIUM-VITAMIN D) 500-200 MG-UNIT tablet Take 1 tablet by mouth daily.  Historical Provider, MD  celecoxib (CELEBREX) 200 MG capsule Take 200 mg by mouth daily.    Historical Provider, MD  cyclobenzaprine (FLEXERIL) 5 MG tablet Take 5 mg by mouth 2 (two) times daily as needed for muscle spasms.    Historical Provider, MD  enoxaparin (LOVENOX) 30 MG/0.3ML injection Inject 0.3 mLs (30 mg total) into the skin every 12 (twelve) hours. Patient not taking: Reported on 08/22/2016 07/05/16   Reche Dixon, PA-C  gabapentin (NEURONTIN) 100 MG capsule Take  100 mg by mouth 3 (three) times daily.    Historical Provider, MD  HYDROcodone-acetaminophen (NORCO) 10-325 MG tablet Take 1-2 tablets by mouth every 4 (four) hours as needed for moderate pain. 07/05/16   Reche Dixon, PA-C  levothyroxine (SYNTHROID, LEVOTHROID) 88 MCG tablet Take 88 mcg by mouth daily before breakfast.    Historical Provider, MD  metoprolol (LOPRESSOR) 50 MG tablet Take 50 mg by mouth every evening.     Historical Provider, MD  oxyCODONE-acetaminophen (PERCOCET) 7.5-325 MG tablet Take 2 tablets by mouth every 4 (four) hours as needed for moderate pain. 07/20/16   Lattie Corns, PA-C  traMADol (ULTRAM) 50 MG tablet Take 2 tablets (100 mg total) by mouth every 6 (six) hours. 07/05/16   Reche Dixon, PA-C  venlafaxine XR (EFFEXOR-XR) 75 MG 24 hr capsule Take 3 capsules (225 mg total) by mouth daily with breakfast. 04/25/16   Max Sane, MD     Allergies Patient has no known allergies.   Family History  Problem Relation Age of Onset  . Diabetes Mother   . Hypertension Mother   . Lung cancer Mother   . Congestive Heart Failure Father   . Congestive Heart Failure Sister     Social History Social History  Substance Use Topics  . Smoking status: Current Every Day Smoker    Packs/day: 0.50    Years: 53.00    Types: Cigarettes  . Smokeless tobacco: Never Used  . Alcohol use No    Review of Systems Unable to reliably obtained due to altered mental status  ____________________________________________   PHYSICAL EXAM:  VITAL SIGNS: ED Triage Vitals [08/22/16 1136]  Enc Vitals Group     BP (!) 175/69     Pulse Rate (!) 102     Resp 18     Temp 97.5 F (36.4 C)     Temp Source Axillary     SpO2 94 %     Weight      Height      Head Circumference      Peak Flow      Pain Score      Pain Loc      Pain Edu?      Excl. in [place]?     Vital signs reviewed, nursing assessments reviewed.   Constitutional:   Alert , Disoriented. Not in distress. Eyes:   No  scleral icterus. No conjunctival pallor. PERRL. EOMI.  No nystagmus. ENT   Head:   Normocephalic and atraumatic.   Nose:   No congestion/rhinnorhea. No septal hematoma   Mouth/Throat:   MMM, no pharyngeal erythema. No peritonsillar mass.    Neck:   No stridor. No SubQ emphysema. No meningismus. Hematological/Lymphatic/Immunilogical:   No cervical lymphadenopathy. Cardiovascular:   Tachycardia heart rate 105. Symmetric bilateral radial and DP pulses.  No murmurs.  Respiratory:   Normal respiratory effort without tachypnea nor retractions. Breath sounds are clear and equal bilaterally. No wheezes/rales/rhonchi. Gastrointestinal:   Soft and nontender. Non distended.  There is no CVA tenderness.  No rebound, rigidity, or guarding. Genitourinary:   deferred Musculoskeletal:   Nontender with normal range of motion in all extremities. No joint effusions.  No lower extremity tenderness.  No edema. Neurologic:   Dysarthria, aphasia No response to confrontation of the right eye Motor weakness of right leg NIH stroke scale 9 Skin:    Skin is warm, dry and intact. No rash noted.  No petechiae, purpura, or bullae.  ____________________________________________    LABS (pertinent positives/negatives) (all labs ordered are listed, but only abnormal results are displayed) Labs Reviewed  APTT - Abnormal; Notable for the following:       Result Value   aPTT 43 (*)    All other components within normal limits  CBC - Abnormal; Notable for the following:    Hemoglobin 11.7 (*)    MCV 79.6 (*)    RDW 16.1 (*)    All other components within normal limits  DIFFERENTIAL - Abnormal; Notable for the following:    Neutro Abs 6.8 (*)    Lymphs Abs 0.8 (*)    Eosinophils Absolute 0.8 (*)    All other components within normal limits  COMPREHENSIVE METABOLIC PANEL - Abnormal; Notable for the following:    Chloride 98 (*)    Glucose, Bld 105 (*)    Total Protein 8.6 (*)    ALT 8 (*)    All other  components within normal limits  GLUCOSE, CAPILLARY - Abnormal; Notable for the following:    Glucose-Capillary 123 (*)    All other components within normal limits  PROTIME-INR  TROPONIN I  CBG MONITORING, ED   ____________________________________________   EKG  Interpreted by me  Date: 08/22/2016  Rate: 100  Rhythm: normal sinus rhythm  QRS Axis: normal  Intervals: normal  ST/T Wave abnormalities: normal  Conduction Disutrbances: none  Narrative Interpretation: unremarkable      ____________________________________________    RADIOLOGY  CT head unremarkable except for chronic atherosclerotic plaque in right M1 segment territory  ____________________________________________   PROCEDURES Procedures CRITICAL CARE Performed by: Joni Fears, Raney Koeppen   Total critical care time: 35 minutes  Critical care time was exclusive of separately billable procedures and treating other patients.  Critical care was necessary to treat or prevent imminent or life-threatening deterioration.  Critical care was time spent personally by me on the following activities: development of treatment plan with patient and/or surrogate as well as nursing, discussions with consultants, evaluation of patient's response to treatment, examination of patient, obtaining history from patient or surrogate, ordering and performing treatments and interventions, ordering and review of laboratory studies, ordering and review of radiographic studies, pulse oximetry and re-evaluation of patient's condition.  ____________________________________________   INITIAL IMPRESSION / ASSESSMENT AND PLAN / ED COURSE  Pertinent labs & imaging results that were available during my care of the patient were reviewed by me and considered in my medical decision making (see chart for details).  Patient presents with acute neurologic deficits, consistent with ischemic stroke. In consultation with neurology Dr. Doy Mince  evaluated the patient on arrival at the bedside as well, stroke scale of 9, patient is a candidate for TPA. She obtained consent from the patient's daughter at the bedside, we placed tPA infusion administered according to protocol. No exclusion criteria identified.     Clinical Course as of Aug 22 1344  Wed Aug 22, 2016  1159 Consent for tpa obtained from daughter by neurologist Dr. Doy Mince in bedside discussion. Plan to start tpa,  plan for transfer to inpatient neuro/nsgy service.   [PS]    Clinical Course User Index [PS] Carrie Mew, MD    ----------------------------------------- 1:44 PM on 08/22/2016 -----------------------------------------  Patient was accepted to [facility] neuro ICU by Dr. Ronalee Red, but they do not have any available beds and is unclear how long the delay would be. Therefore discussed with Dr. Leonel Ramsay of [facility] neurology who accepts. They have medical ICU beds they can accept the patient to. [facility] en route. Dr. Leonel Ramsay request CT angiogram of the head and neck prior to transfer to expedite evaluation for possible intervention and clot retrieval. We will obtain CT angiogram right away. Labs are unremarkable. Patient finished tPA administration about  20-30 minutes ago. Awaiting transfer by [facility] critical care.  ----------------------------------------- 1:57 PM on 08/22/2016 -----------------------------------------  Patient noted to be having a choking episode. No vomiting. She was assisted and suctioning some thick secretions from her oropharynx with resolution of symptoms. Intact strong gag reflex. Breath sounds remain clear bilaterally. Seems to be managing secretions and protecting airway, would not intubate at this time. We'll continue to reassess prior to transport. Obtaining CT angiogram now  ----------------------------------------- 2:42 PM on 08/22/2016 -----------------------------------------  Patient has departed the emergency  department en route to [facility]. CT angiogram results discussed with radiology, who also notes 5 mm left upper lobe lung nodule that was not previously identified on imaging. This should be followed up after discharge from hospital after treatment of acute neurologic event. ____________________________________________   FINAL CLINICAL IMPRESSION(S) / ED DIAGNOSES  Final diagnoses:  Acute ischemic stroke [facility])  Dysarthria  Disorientation  Left upper lobe lung nodule    New Prescriptions   No medications on file     Portions of this note were generated with dragon dictation software. Dictation errors may occur despite best attempts at proofreading.    Carrie Mew, MD 08/22/16 [place], MD 08/22/16 [phone]

## 2016-08-23 ENCOUNTER — Inpatient Hospital Stay (HOSPITAL_COMMUNITY): Payer: Medicare Other

## 2016-08-23 DIAGNOSIS — G451 Carotid artery syndrome (hemispheric): Secondary | ICD-10-CM

## 2016-08-23 DIAGNOSIS — E785 Hyperlipidemia, unspecified: Secondary | ICD-10-CM

## 2016-08-23 DIAGNOSIS — F17200 Nicotine dependence, unspecified, uncomplicated: Secondary | ICD-10-CM

## 2016-08-23 DIAGNOSIS — R911 Solitary pulmonary nodule: Secondary | ICD-10-CM

## 2016-08-23 DIAGNOSIS — I10 Essential (primary) hypertension: Secondary | ICD-10-CM

## 2016-08-23 DIAGNOSIS — R4701 Aphasia: Secondary | ICD-10-CM

## 2016-08-23 LAB — LIPID PANEL
Cholesterol: 136 mg/dL (ref 0–200)
HDL: 48 mg/dL (ref >40)
LDL Cholesterol: 68 mg/dL (ref 0–99)
Total CHOL/HDL Ratio: 2.8 RATIO
Triglycerides: 101 mg/dL (ref <150)
VLDL: 20 mg/dL (ref 0–40)

## 2016-08-23 MED ORDER — PANTOPRAZOLE SODIUM 40 MG PO TBEC
40.0000 mg | DELAYED_RELEASE_TABLET | Freq: Every day | ORAL | Status: DC
  Administered 2016-08-23 – 2016-08-24 (×2): 40 mg via ORAL
  Filled 2016-08-23 (×2): qty 1

## 2016-08-23 MED ORDER — CLOPIDOGREL BISULFATE 75 MG PO TABS
75.0000 mg | ORAL_TABLET | Freq: Every day | ORAL | Status: DC
  Administered 2016-08-23 – 2016-08-24 (×2): 75 mg via ORAL
  Filled 2016-08-23 (×2): qty 1

## 2016-08-23 NOTE — Care Management Note (Signed)
Case Management Note  Patient Details  Name: [name] Douglas MRN: [id number] Date of Birth: 1941-06-24  Subjective/Objective:  Pt admitted on 08/22/16 s/p stroke with TPA given.  PTA, pt independent of ADLS.                    Action/Plan: Will follow for discharge planning as pt progresses.  PT/OT evaluations pending.    Expected Discharge Date:                  Expected Discharge Plan:  [place]  In-House Referral:     Discharge planning Services  CM Consult  Post Acute Care Choice:    Choice offered to:     DME Arranged:    DME Agency:     HH Arranged:    [place] Agency:     Status of Service:  In process, will continue to follow  If discussed at Long Length of Stay Meetings, dates discussed:    Additional Comments:  Ella Bodo, RN 08/23/2016, 3:13 PM

## 2016-08-23 NOTE — Evaluation (Signed)
Physical Therapy Evaluation Patient Details Name: [name] Douglas MRN: [id number] DOB: July 12, 1941 Today's Date: 08/23/2016   History of Present Illness  Patient is a 75 y/o female with hx of Rt TKA in Dec 2017, anxiety, chronic low back pain, COPD, 02 at night and left TKA presents at her doctor's office with slurred speech and global aphasia. NIH 9 in ED. s/p tPA. Head MRI and CT-unremarkable. 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 R/o seizure.  Clinical Impression  Patient presents with generalized weakness, tachycardia and impaired mobility s/p above. Pt lives alone and was independent PTA. Tolerated short distance ambulation with Min A for balance but limited due to HR up to 138 bpm. Pt reports feeling hot during ambulation. Requires cues for self regulation of activity. At this time, pt not safe to return home alone. Question safety awareness. Would benefit from rehab to maximize independence, mobility and safety prior to return home.     Follow Up Recommendations CIR    Equipment Recommendations  None recommended by PT    Recommendations for Other Services Rehab consult;OT consult     Precautions / Restrictions Precautions Precautions: Fall Precaution Comments: watch HR Restrictions Weight Bearing Restrictions: No      Mobility  Bed Mobility Overal bed mobility: Needs Assistance Bed Mobility: Supine to Sit     Supine to sit: Min assist;HOB elevated     General bed mobility comments: Assist to scoot bottom to EOB, some difficulty requiring cues for sequencing as pt trying to scoot her bottom towards edge (almost falling off due to not squaring up hips)  Transfers Overall transfer level: Needs assistance Equipment used: 1 person hand held assist Transfers: Sit to/from Stand Sit to Stand: Min assist         General transfer comment: Assist to power to standing with multiple attempts. No dizziness reported.    Ambulation/Gait Ambulation/Gait assistance: Min assist Ambulation Distance (Feet): 12 Feet Assistive device: 1 person hand held assist Gait Pattern/deviations: Step-to pattern;Decreased step length - right;Decreased step length - left;Narrow base of support;Shuffle Gait velocity: decreased Gait velocity interpretation: <1.8 ft/sec, indicative of risk for recurrent falls General Gait Details: Short shuffling steps with Min A for balance. HR up to 138 bpm. Pt reports feeling hot.  Stairs            Wheelchair Mobility    Modified Rankin (Stroke Patients Only) Modified Rankin (Stroke Patients Only) Pre-Morbid Rankin Score: Moderate disability Modified Rankin: Moderately severe disability     Balance Overall balance assessment: Needs assistance Sitting-balance support: Feet supported;No upper extremity supported Sitting balance-Leahy Scale: Fair     Standing balance support: During functional activity;Single extremity supported Standing balance-Leahy Scale: Fair Standing balance comment: Requires at least 1 UE support in standing.                              Pertinent Vitals/Pain Pain Assessment: No/denies pain    Home Living Family/patient expects to be discharged to:: Private residence Living Arrangements: Alone Available Help at Discharge: Family Type of Home: Apartment Web designer living Apt) Home Access: Stairs to enter;Elevator (3 or 5 STE building) Entrance Stairs-Rails: Psychiatric nurse of Steps: 3 or 5 STE building Home Layout: One level Home Equipment: Environmental consultant - 2 wheels;Cane - single point;Shower seat Additional Comments: shower seat is not safe per daughter    Prior Function Level of Independence: Independent  Comments: Recently d/ced (Nov 2017) from [employer] after TKA and just was getting back to independence at home. Independent for ambulation and ADLs.     Hand Dominance   Dominant Hand:  Right    Extremity/Trunk Assessment   Upper Extremity Assessment Upper Extremity Assessment: Defer to OT evaluation    Lower Extremity Assessment Lower Extremity Assessment: Generalized weakness (Grossly ~3/5 throughout. Reports sensation WFL.)       Communication   Communication: No difficulties  Cognition Arousal/Alertness: Awake/alert Behavior During Therapy: WFL for tasks assessed/performed;Impulsive Overall Cognitive Status: Impaired/Different from baseline Area of Impairment: Safety/judgement;Problem solving;Following commands       Following Commands: Follows multi-step commands with increased time Safety/Judgement: Decreased awareness of safety;Decreased awareness of deficits   Problem Solving: Requires verbal cues General Comments: "I am fine" with all questions asked.     General Comments General comments (skin integrity, edema, etc.): BP stable. Daughter present during session.    Exercises     Assessment/Plan    PT Assessment Patient needs continued PT services  PT Problem List Decreased strength;Decreased mobility;Decreased balance;Cardiopulmonary status limiting activity;Decreased activity tolerance;Decreased safety awareness;Decreased cognition          PT Treatment Interventions DME instruction;Therapeutic activities;Gait training;Therapeutic exercise;Patient/family education;Balance training;Stair training;Functional mobility training    PT Goals (Current goals can be found in the Care Plan section)  Acute Rehab PT Goals Patient Stated Goal: to go home PT Goal Formulation: With patient Time For Goal Achievement: 09/06/16 Potential to Achieve Goals: Fair    Frequency Min 3X/week   Barriers to discharge Decreased caregiver support lives alone    Co-evaluation               End of Session Equipment Utilized During Treatment: Gait belt Activity Tolerance: Treatment limited secondary to medical complications (Comment) (elevated  HR) Patient left: in chair;with call bell/phone within reach;with family/visitor present Nurse Communication: Mobility status         Time: [id number] PT Time Calculation (min) (ACUTE ONLY): 22 min   Charges:   PT Evaluation $PT Eval Moderate Complexity: 1 Procedure     PT G Codes:        [place] A Carlisa Eble 08/23/2016, 4:24 PM Wray Kearns, [place], DPT [phone]

## 2016-08-23 NOTE — Progress Notes (Signed)
Responded to nurse's request to visit w/ pt and explain advanced directive. Explained to pt w/ 1 daughter in rm for entire conversation and another joining partway through. Left form for family to review w/ pt overnight after explaining hospital process to them, w/ instructions to ask nurse to page for chaplain tomorrow if/when ready to complete form. Also provided spiritual/emotional support and prayer. Chaplain available for f/u.    08/23/16 1600  Clinical Encounter Type  Visited With Patient and family together  Visit Type Initial;Psychological support;Spiritual support;Social support  Referral From Nurse  Spiritual Encounters  Spiritual Needs Brochure;Prayer;Emotional  Stress Factors  Patient Stress Factors Health changes;Loss of control  Family Stress Factors Family relationships;Health changes;Loss of control

## 2016-08-23 NOTE — Progress Notes (Signed)
[facility]  Patient Status: Active (receiving services up to time of hospitalization)  [facility] is providing the following services: RN, PT and OT  If patient discharges after hours, please call [phone].   [name] Douglas 08/23/2016, 10:03 AM

## 2016-08-23 NOTE — Progress Notes (Signed)
PT Cancellation Note  Patient Details Name: [name] Douglas MRN: [id number] DOB: [birth year]   Cancelled Treatment:    Reason Eval/Treat Not Completed: Patient not medically ready Pt on bedrest s/p tpA. Will follow up as appropriate.   Marguarite Arbour A Derak Schurman 08/23/2016, 8:52 AM Wray Kearns, PT, DPT [phone]

## 2016-08-23 NOTE — Progress Notes (Signed)
Occupational Therapy Evaluation Patient Details Name: [name] Douglas MRN: [id number] DOB: 09-May-1941 Today's Date: 08/23/2016    History of Present Illness Patient is a 76 y/o female with hx of Rt TKA in Dec 2017, anxiety, chronic low back pain, COPD, 02 at night and left TKA presents at her doctor's office with slurred speech and global aphasia. NIH 9 in ED. s/p tPA. Head MRI and CT-unremarkable. 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 R/o seizure.   Clinical Impression   PTA, pt lived in a senior living apt and was independent with ADL and mobility, including driving. Pt recently DC home from facility after rehab for her recent R knee replacement. On evaluation, pt unable to recount occurrence of CVA symptoms ( she had been told several times during the day that she had trouble with her words) and began telling story that her knee replacement surgery was in June (surgery was in November). When asked about medication management, pt unable to give any information about her meds. Although daughter assists with medication, daughter states her mother knows some of her medications and is unable to relay that information at this time. Pt oriented to day and date earlier today, but not to date this pm. Pt became tachy with standing (HR to 138) and complained of dizziness, therefore, did not complete further mobility. BP 138/58. Daughter states she is concerned because her mom seems more confused since she complained of a "sharp pain" in her head. At this time, if pt is to DC home, she will need 24/7 S. If pt does not progress to her baseline, she will need SNF for rehab. Will reassess tomorrow. Recommend following all measures to reduce delirium.     Follow Up Recommendations  Supervision/Assistance - 24 hour HHOT vs SNF pending progress   Equipment Recommendations  None recommended by OT    Recommendations for Other Services        Precautions / Restrictions Precautions Precautions: Fall Precaution Comments: watch HR Restrictions Weight Bearing Restrictions: No      Mobility Bed Mobility Overal bed mobility: Needs Assistance Bed Mobility: Supine to Sit     Supine to sit: Min assist;HOB elevated     General bed mobility comments: Pt OOB in chair  Transfers Overall transfer level: Needs assistance Equipment used: 1 person hand held assist Transfers: Sit to/from Stand Sit to Stand: Min assist         General transfer comment: unsteady with standing; complains of dizziness    Balance Overall balance assessment: Needs assistance Sitting-balance support: Feet supported;No upper extremity supported Sitting balance-Leahy Scale: Good     Standing balance support: During functional activity;Single extremity supported Standing balance-Leahy Scale: Fair Standing balance comment: Requires at least 1 UE support in standing.                             ADL Overall ADL's : Needs assistance/impaired     Grooming: Set up   Upper Body Bathing: Set up;Supervision/ safety;Sitting   Lower Body Bathing: Minimal assistance;Sit to/from stand   Upper Body Dressing : Supervision/safety;Set up;Sitting   Lower Body Dressing: Minimal assistance;Sit to/from stand   Toilet Transfer: Minimal assistance   Toileting- Clothing Manipulation and Hygiene: Minimal assistance       Functional mobility during ADLs: Minimal assistance General ADL Comments: limited due to HR increasingto 138 in standing and complaints of being dizzy  Vision Vision Assessment?: No apparent visual deficits   Perception     Praxis      Pertinent Vitals/Pain Pain Assessment: Faces Faces Pain Scale: Hurts a little bit Pain Location: head Pain Descriptors / Indicators: Grimacing     Hand Dominance Right   Extremity/Trunk Assessment Upper Extremity Assessment Upper Extremity Assessment: Overall WFL for tasks  assessed   Lower Extremity Assessment Lower Extremity Assessment: Defer to PT evaluation   Cervical / Trunk Assessment Cervical / Trunk Assessment: Normal   Communication Communication Communication: No difficulties   Cognition Arousal/Alertness: Awake/alert Behavior During Therapy: Anxious Overall Cognitive Status: Impaired/Different from baseline Area of Impairment: Memory;Orientation;Safety/judgement;Awareness Orientation Level: Disoriented to;Time     Following Commands: Follows one step commands with increased time Safety/Judgement: Decreased awareness of deficits Awareness: Emergent Problem Solving: Slow processing;Requires verbal cues General Comments: Family states pt is more confused since she had the "sharp pain in her head". Pt not able to recount information she had been told earlier in the day. Pt confused regarding her dates of surgery for her knee replacement. Pt telling this therapist that hse had surgery in June, but daughters stae she had surgery in November   General Comments       Exercises       Shoulder Instructions      Home Living Family/patient expects to be discharged to:: Private residence Living Arrangements: Alone Available Help at Discharge: Family Type of Home: Apartment Home Access: Stairs to Probation officer of Steps: 3 or 5 STE building Entrance Stairs-Rails: Right;Left Home Layout: One level     Bathroom Shower/Tub: Tub/shower unit;Curtain Shower/tub characteristics: Architectural technologist: Standard Bathroom Accessibility: Yes How Accessible: Accessible via walker Home Equipment: Gilford Rile - 2 wheels;Cane - single point;Shower seat   Additional Comments: shower seat is not safe per daughter  Lives With: Alone    Prior Functioning/Environment Level of Independence: Independent        Comments: Recently d/ced (Nov 2017) from [facility] after TKA and just was getting back to independence at home.  Independent for ambulation and ADLs.        OT Problem List: Decreased strength;Decreased activity tolerance;Impaired balance (sitting and/or standing);Decreased safety awareness;Cardiopulmonary status limiting activity;Obesity;Pain   OT Treatment/Interventions: Self-care/ADL training;Energy conservation;DME and/or AE instruction;Therapeutic activities;Cognitive remediation/compensation;Patient/family education    OT Goals(Current goals can be found in the care plan section) Acute Rehab OT Goals Patient Stated Goal: to go home OT Goal Formulation: With patient Time For Goal Achievement: 09/06/16 Potential to Achieve Goals: Good  OT Frequency: Min 2X/week   Barriers to D/C: Decreased caregiver support          Co-evaluation              End of Session Nurse Communication: Other (comment) (medical status)  Activity Tolerance: Treatment limited secondary to medical complications (Comment) (HR sustained 138 sit - stand) Patient left: in chair;with call bell/phone within reach;with family/visitor present   Time: [id number] OT Time Calculation (min): 18 min Charges:  OT General Charges $OT Visit: 1 Procedure OT Evaluation $OT Eval Moderate Complexity: 1 Procedure G-Codes:    Hanaan Gancarz,HILLARY 2016-09-22, 5:17 PM   [facility], OT/L  [id number] 2016-09-22

## 2016-08-23 NOTE — Evaluation (Signed)
Clinical/Bedside Swallow Evaluation Patient Details  Name: [name] Douglas MRN: [id number] Date of Birth: September 28, 1940  Today's Date: 08/23/2016 Time: SLP Start Time (ACUTE ONLY): [id number] SLP Stop Time (ACUTE ONLY): [id number] SLP Time Calculation (min) (ACUTE ONLY): 22 min  Past Medical History:  Past Medical History:  Diagnosis Date  . Anxiety   . Arthritis    "shoulders" (08/20/2014)  . Asthma   . Chronic lower back pain   . Complication of anesthesia    "agitated & restless after knee replacement in 2008"  . COPD (chronic obstructive pulmonary disease) ([place])   . DDD (degenerative disc disease), lumbar   . Depression   . Diverticulosis   . GERD (gastroesophageal reflux disease)   . High cholesterol   . Hypertension   . Hypothyroidism   . On home oxygen therapy    "2L at night" (08/20/2014)  . Shortness of breath dyspnea    with exertion   Past Surgical History:  Past Surgical History:  Procedure Laterality Date  . CARDIAC CATHETERIZATION  ~ 2007  . EXCISIONAL HEMORRHOIDECTOMY  1960's  . EYE SURGERY Bilateral    Cataract extraction with IOL  . JOINT REPLACEMENT Left 2009  . KNEE ARTHROSCOPY Left X 2 <2008  . KNEE ARTHROSCOPY WITH SUBCHONDROPLASTY Right 04/19/2016   Procedure: KNEE ARTHROSCOPY WITH SUBCHONDROPLASTY, PARTIAL MENISCECTOMY;  Surgeon: Hessie Knows, MD;  Location: [facility] ORS;  Service: Orthopedics;  Laterality: Right;  . SHOULDER OPEN ROTATOR CUFF REPAIR Right 2001  . TONSILLECTOMY  ~ 1950  . TOTAL KNEE ARTHROPLASTY Left 2008  . TOTAL KNEE ARTHROPLASTY Right 07/03/2016   Procedure: TOTAL KNEE ARTHROPLASTY;  Surgeon: Hessie Knows, MD;  Location: [facility] ORS;  Service: Orthopedics;  Laterality: Right;  . TUBAL LIGATION  1973  . VAGINAL HYSTERECTOMY  1974   HPI:  [name] Douglas an 76 y.o.with PMH: anxiety, chronic lower back pain, COPD, GERDR, HTN admitted with headache and slurred speech. CT No change. No acute finding. Normal for age . Focal right middle cerebral  bifurcation region calcification as seen previously. MRI pending.   Assessment / Plan / Recommendation Clinical Impression  Pt became slightly dyspneic during assessment (has COPD). Possible delayed swallow initiation however no overt s/s aspiration. Pt not worn dentures in 2 month due to ill fit per pt. Mastication slower and more deliberate. Recommend Dys 3 texture, thin liquids, straws allowed, pills with thin and rest breaks if needed (due to COPD). Will follow for safety and upgrade.     Aspiration Risk  Mild aspiration risk    Diet Recommendation Dysphagia 3 (Mech soft);Thin liquid   Liquid Administration via: Straw;Cup Medication Administration: Whole meds with liquid Supervision: Patient able to self feed;Intermittent supervision to cue for compensatory strategies Compensations: Slow rate;Small sips/bites Postural Changes: Seated upright at 90 degrees;Remain upright for at least 30 minutes after po intake    Other  Recommendations Oral Care Recommendations: Oral care BID   Follow up Recommendations None      Frequency and Duration min 2x/week  2 weeks       Prognosis Prognosis for Safe Diet Advancement: Good      Swallow Study   General HPI: [name] Douglas an 76 y.o.with PMH: anxiety, chronic lower back pain, COPD, GERDR, HTN admitted with headache and slurred speech. CT No change. No acute finding. Normal for age . Focal right middle cerebral bifurcation region calcification as seen previously. MRI pending. Type of Study: Bedside Swallow Evaluation Previous Swallow Assessment:  (none) Diet Prior to  this Study: NPO Temperature Spikes Noted: No Respiratory Status: Nasal cannula History of Recent Intubation: No Behavior/Cognition: Alert;Cooperative;Pleasant mood Oral Cavity Assessment: Within Functional Limits Oral Care Completed by SLP: No Oral Cavity - Dentition: Edentulous (dentures at home and say don't fit) Vision: Functional for self-feeding Self-Feeding  Abilities: Able to feed self Patient Positioning: Upright in bed Baseline Vocal Quality: Normal Volitional Cough: Strong Volitional Swallow: Able to elicit    Oral/Motor/Sensory Function Overall Oral Motor/Sensory Function:  (decreased coordination )   Ice Chips Ice chips: Not tested   Thin Liquid Thin Liquid: Impaired Presentation: Cup;Straw Pharyngeal  Phase Impairments: Suspected delayed Swallow;Change in Vital Signs (increased respirations)    Nectar Thick Nectar Thick Liquid: Not tested   Honey Thick Honey Thick Liquid: Not tested   Puree Puree: Within functional limits   Solid   GO   Solid: Impaired Oral Phase Impairments: Impaired mastication Oral Phase Functional Implications: Prolonged oral transit        [name] 08/23/2016,11:14 AM  Orbie Pyo Colvin Caroli.Ed [employer] [phone]

## 2016-08-23 NOTE — Progress Notes (Signed)
Inpatient Rehabilitation  Per SLP request patient was screened by Gunnar Fusi for appropriateness for an Inpatient Acute Rehab consult.  At this time plan to follow along for OT and PT needs prior to determining appropriateness for an Inpatient Rehab consult.  Please call with questions.    Carmelia Roller., CCC/SLP Admission Coordinator  [place]  Cell [phone]

## 2016-08-23 NOTE — Progress Notes (Signed)
STROKE TEAM PROGRESS NOTE   HISTORY OF PRESENT ILLNESS (per record) [name] Douglas is an 76 y.o. female with multiple medical problems who has been under the care of infectious disease at [facility] for postoperative infection of her knee. Patient apparently complained of a headache last evening but was relieved with Tylenol. This a.m. she went to go to the doctor's appointment and when she spoke with her ex-husband she was at baseline. When he came to pick her up at 1025 she had slurred speech. She was then taken to the doctor who sent her to the ED. Upon entering ED she had an NIH stroke scale of 9 and was both expressively and receptively a phasic. Patient received TPA and was sent to [facility] for further evaluation and care at the ICU. On arrival patient currently is both receptively and expressively a phasic. While in the ICU patient was noted to have bouts of emesis which was concerning for possible intracranial abnormalities and a stat CT was ordered. She was last known well: Date at 0900 on 08/22/2016. Patient was administered IV t-PA. She was admitted to the neuro ICU for further evaluation and treatment.   SUBJECTIVE (INTERVAL HISTORY) Daughter and son are at bedside. Pt now symptom all resolved. Speech back to baseline. Had MRI negative for acute stroke. EEG pending. She denies heart palpitation.    OBJECTIVE Temp:  [97.5 F (36.4 C)-99.6 F (37.6 C)] 97.6 F (36.4 C) (01/04 0800) Pulse Rate:  [85-110] 87 (01/04 0700) Cardiac Rhythm: Normal sinus rhythm (01/04 0400) Resp:  [13-27] 16 (01/04 0700) BP: (109-175)/(41-110) 109/43 (01/04 0700) SpO2:  [90 %-100 %] 95 % (01/04 0700) Weight:  [95.3 kg (210 lb)-97.1 kg (214 lb)] 95.3 kg (210 lb) (01/03 1204)  CBC:  Recent Labs Lab 08/22/16 1145  WBC 9.0  NEUTROABS 6.8*  HGB 11.7*  HCT 35.2  MCV 79.6*  PLT [id number]    Basic Metabolic Panel:  Recent Labs Lab 08/22/16 1145  NA 136  K 5.1  CL 98*  CO2 27  GLUCOSE  105*  BUN 10  CREATININE 0.89  CALCIUM 9.6    Lipid Panel:    Component Value Date/Time   CHOL 136 08/23/2016 0508   TRIG 101 08/23/2016 0508   HDL 48 08/23/2016 0508   CHOLHDL 2.8 08/23/2016 0508   VLDL 20 08/23/2016 0508   LDLCALC 68 08/23/2016 0508   HgbA1c: No results found for: HGBA1C Urine Drug Screen: No results found for: LABOPIA, COCAINSCRNUR, LABBENZ, AMPHETMU, THCU, LABBARB    IMAGING I have personally reviewed the radiological images below and agree with the radiology interpretations.  Ct Head Code Stroke W/o Cm 08/22/2016 1. No acute intracranial abnormality. 2. ASPECTS is 10   Ct Angio Head W Or Wo Contrast Ct Angio Neck W And/or Wo Contrast 08/22/2016 1. Negative for emergent large vessel occlusion. No significant carotid or left MCA stenosis. Generalized intracranial artery dolichoectasia. 2. Positive for moderate to severe distal right MCA posterior division (distal M2 and proximal M3) irregularity and stenosis. 3. Moderate to severe stenoses at both vertebral artery origins. 60-65% stenosis at the left subclavian artery origin. 4. Chronic lung disease. 5. New 12 mm left upper lobe pulmonary nodule since a chest CT in December 2015.   Ct Head Wo Contrast 08/22/2016 No change. No acute finding. Normal for age . Focal right middle cerebral bifurcation region calcification as seen previously.   2D Echocardiogram  - Left ventricle: The cavity size was normal. Systolic  function was normal. The estimated ejection fraction was 60%. Doppler parameters are consistent with abnormal left ventricular relaxation (grade 1 diastolic dysfunction). - Aortic valve: There was mild regurgitation. Valve area (VTI): 1.76 cm^2. Valve area (Vmax): 1.49 cm^2. Valve area (Vmean): 1.57 cm^2. - Mitral valve: There was mild regurgitation. - Right ventricle: The cavity size was mildly dilated. - Right atrium: The atrium was mildly dilated. - Atrial septum: No defect or patent foramen ovale was  identified. - Pulmonary arteries: PA peak pressure: 50 mm Hg (S). Impressions: - The right ventricular systolic pressure was increased consistent with severe pulmonary hypertension. Normal left ventricular systolic function with mild diastolic dysfunction and febrile calcified aortic valve with mild mitral regurgitation and mild tricuspid regurgitation. No vegetation seen.  Mr Brain Wo Contrast 08/23/2016 IMPRESSION: 1. No acute intracranial abnormality. 2. Mild-to-moderate chronic small vessel ischemic disease.   TTE 07/20/16 The right ventricular systolic pressure was increased consistent   with severe pulmonary hypertension. Normal left ventricular   systolic function with mild diastolic dysfunction and febrile   calcified aortic valve with mild mitral regurgitation and mild   tricuspid regurgitation. No vegetation seen.  EEG pending  LE venous doppler pending   PHYSICAL EXAM  Temp:  [97.6 F (36.4 C)-99.6 F (37.6 C)] 97.9 F (36.6 C) (01/04 1200) Pulse Rate:  [85-110] 98 (01/04 1100) Resp:  [13-27] 16 (01/04 1100) BP: (109-170)/(41-70) 149/45 (01/04 1100) SpO2:  [90 %-100 %] 100 % (01/04 1100)  General - Well nourished, well developed, in no apparent distress.  Ophthalmologic - Fundi not visualized due to eye movement.  Cardiovascular - Regular rate and rhythm.  Mental Status -  Level of arousal and orientation to time, place, and person were intact. Language including expression, naming, repetition, comprehension was assessed and found intact. Fund of Knowledge was assessed and was impaired.  Cranial Nerves II - XII - II - Visual field intact OU. III, IV, VI - Extraocular movements intact. V - Facial sensation intact bilaterally. VII - Facial movement intact bilaterally. VIII - Hearing & vestibular intact bilaterally. X - Palate elevates symmetrically. XI - Chin turning & shoulder shrug intact bilaterally. XII - Tongue protrusion intact.  Motor Strength - The  patient's strength was normal in all extremities and pronator drift was absent.  Bulk was normal and fasciculations were absent.   Motor Tone - Muscle tone was assessed at the neck and appendages and was normal.  Reflexes - The patient's reflexes were 1+ in all extremities and she had no pathological reflexes.  Sensory - Light touch, temperature/pinprick were assessed and were symmetrical.    Coordination - The patient had normal movements in the hands with no ataxia or dysmetria.  Essential tremor presented on the left hand  Gait and Station - deferred.   ASSESSMENT/PLAN [name] is a 76 y.o. female with history of multiple medical problems presenting with expressive and receptive aphasia, R sided weakness. She received IV t-PA 1/3/201 at 1200.   TIA:  L brain TIA s/p IV tPA, etiology unclear. Need to rule out seizure. EEG pending. Afebrile, normal WBC, had 4 weeks IV Abx for infection s/p knee surgery, unlikely to have endocarditis.   Resultant symptoms resolved.  CTA head / neck no large vessel occlusion, B VA origin stenosis, L SCA stenosis  MRI  No acute stroke  LE doppler  Pending  EEG pending to rule out seizure  2D Echo 07/20/16  EF 60%. No source of embolus   Pt  needs 30 day cardiac event monitoring as outpt to rule out afib  LDL 68  HgbA1c pending  SCDs for VTE prophylaxis  DIET DYS 3 Room service appropriate? Yes; Fluid consistency: Thin  ASA prior to admission, now on plavix for stroke prevention. Continue plavix for stroke prevention.   Ongoing aggressive stroke risk factor management  Therapy recommendations:  pending   Disposition:  pending  (receives Location manager, PT and OT at home PTA)  Hypertension  Stable Long-term BP goal normotensive  Hyperlipidemia  Home meds:  lipitor 10, resumed in hospital  LDL 68, at goal < 70  Continue statin at discharge  Tobacco abuse  Current smoker  Smoking cessation counseling provided  Pt is  willing to quit  Other Stroke Risk Factors  Advanced age    Other Active Problems  52mm LUL pulmonary nodule, needs followup  Knee infection - s/p IV abx for 4 weeks  COPD, on home O2  Hypothyroid, on synthroid    Hospital day # 1  This patient is critically ill due to left brain stroke symptoms s/p tPA and at significant risk of neurological worsening, death form recurrent stroke, hemorrhagic transformation, infection. This patient's care requires constant monitoring of vital signs, hemodynamics, respiratory and cardiac monitoring, review of multiple databases, neurological assessment, discussion with family, other specialists and medical decision making of high complexity. I spent 35 minutes of neurocritical care time in the care of this patient.  Rosalin Hawking, MD PhD Stroke Neurology 08/23/2016 3:26 PM   To contact Stroke Continuity provider, please refer to [email or web address] After hours, contact General Neurology

## 2016-08-23 NOTE — Progress Notes (Signed)
EEG Completed; Results Pending  

## 2016-08-23 NOTE — Plan of Care (Signed)
Problem: Education: Goal: Knowledge of disease or condition will improve Outcome: Not Progressing Patient needs reinforcement on education of signs and symptoms of stroke as well as prevention.

## 2016-08-23 NOTE — Progress Notes (Signed)
Dr. Erlinda Hong updated on patient condition: made aware patient ambulated with PT 12 ft and had increased HR to 138 and complained of dizziness and became flushed. Patient sat in chair and states "I'm fine.". Also, patient complained of sharp pain to right side of head that resolved without intervention and lasted less than a minute. Made aware 2 daughters at bedside and concerned that patient is more confused this evening. MD made aware orientation questions that patient previously was able to answer appropriately the patient is confused about this evening. Patient is currently sitting in the chair without signs of distress, family present at bedside, VSS per flowsheet. Continuing with current orders. Ok for patient to transfer to floor per MD. Patient's daughter updated via phone by Dr. Erlinda Hong. Nursing to continue to monitor.

## 2016-08-23 NOTE — Procedures (Signed)
History: 76 year old female with a history of transient aphasic episode  Sedation: None  Technique: This is a 21 channel routine scalp EEG performed at the bedside with bipolar and monopolar montages arranged in accordance to the international 10/20 system of electrode placement. One channel was dedicated to EKG recording.    Background: The background consists of intermixed alpha and beta activities. There is a well defined posterior dominant rhythm of 9 Hz that attenuates with eye opening. There is an increase in delta associated with drowsiness, but sleep is not recorded.   Photic stimulation: Physiologic driving is not performed  EEG Abnormalities: None  Clinical Interpretation: This normal EEG is recorded in the waking and drowsy state. There was no seizure or seizure predisposition recorded on this study. Please note that a normal EEG does not preclude the possibility of epilepsy.   Roland Rack, MD [facility] [phone]  If 7pm- 7am, please page neurology on call as listed in [place].

## 2016-08-23 NOTE — Progress Notes (Signed)
Patient transferred to 5M04. Patient made comfortable, denies any pain, states she will call for help prior to getting oob. Call bell next to patient, bed alarm on

## 2016-08-23 NOTE — Evaluation (Signed)
Speech Language Pathology Evaluation Patient Details Name: [name] Douglas MRN: [id number] DOB: 07-12-1941 Today's Date: 08/23/2016 Time: [id number] SLP Time Calculation (min) (ACUTE ONLY): 22 min  Problem List:  Patient Active Problem List   Diagnosis Date Noted  . Stroke (cerebrum) ([place]) 08/22/2016  . Cellulitis of right knee 07/17/2016  . Primary localized osteoarthritis of right knee 07/03/2016  . Ileus ([place]) 04/24/2016  . Generalized abdominal pain   . Colitis due to Clostridium difficile   . Recurrent Clostridium difficile diarrhea   . Enteritis due to Clostridium difficile 08/22/2014  . Diarrhea 08/20/2014  . HTN (hypertension) 08/20/2014  . HLD (hyperlipidemia) 08/20/2014  . Hypothyroidism 08/20/2014  . Tobacco abuse 08/20/2014  . COPD (chronic obstructive pulmonary disease) ([place]) 08/20/2014  . Back pain 08/20/2014   Past Medical History:  Past Medical History:  Diagnosis Date  . Anxiety   . Arthritis    "shoulders" (08/20/2014)  . Asthma   . Chronic lower back pain   . Complication of anesthesia    "agitated & restless after knee replacement in 2008"  . COPD (chronic obstructive pulmonary disease) ([place])   . DDD (degenerative disc disease), lumbar   . Depression   . Diverticulosis   . GERD (gastroesophageal reflux disease)   . High cholesterol   . Hypertension   . Hypothyroidism   . On home oxygen therapy    "2L at night" (08/20/2014)  . Shortness of breath dyspnea    with exertion   Past Surgical History:  Past Surgical History:  Procedure Laterality Date  . CARDIAC CATHETERIZATION  ~ 2007  . EXCISIONAL HEMORRHOIDECTOMY  1960's  . EYE SURGERY Bilateral    Cataract extraction with IOL  . JOINT REPLACEMENT Left 2009  . KNEE ARTHROSCOPY Left X 2 <2008  . KNEE ARTHROSCOPY WITH SUBCHONDROPLASTY Right 04/19/2016   Procedure: KNEE ARTHROSCOPY WITH SUBCHONDROPLASTY, PARTIAL MENISCECTOMY;  Surgeon: Hessie Knows, MD;  Location: [facility] ORS;  Service: Orthopedics;   Laterality: Right;  . SHOULDER OPEN ROTATOR CUFF REPAIR Right 2001  . TONSILLECTOMY  ~ 1950  . TOTAL KNEE ARTHROPLASTY Left 2008  . TOTAL KNEE ARTHROPLASTY Right 07/03/2016   Procedure: TOTAL KNEE ARTHROPLASTY;  Surgeon: Hessie Knows, MD;  Location: [facility] ORS;  Service: Orthopedics;  Laterality: Right;  . TUBAL LIGATION  1973  . VAGINAL HYSTERECTOMY  1974   HPI:  [name] Douglas an 76 y.o.with PMH: anxiety, chronic lower back pain, COPD, GERDR, HTN admitted with headache and slurred speech. CT No change. No acute finding. Normal for age . Focal right middle cerebral bifurcation region calcification as seen previously. MRI pending.   Assessment / Plan / Recommendation Clinical Impression  Pt given parts of the [employer] standardized assessment and demonstrated mild impairments in the areas of memory, problem solving, reasoning and awareness. Occassional difficulty with word accuracy/semantics. Pt lives alone and states that her daughter "fixes" her pill box for her but she handles her finances. Pt would benefit from continued ST on acute care and CIR.    SLP Assessment  Patient needs continued Speech Lanaguage Pathology Services    Follow Up Recommendations  Inpatient Rehab    Frequency and Duration min 2x/week  2 weeks      SLP Evaluation Cognition  Overall Cognitive Status: Impaired/Different from baseline Arousal/Alertness: Awake/alert Orientation Level: Oriented to person;Disoriented to time;Oriented to place;Oriented to situation Attention: Sustained Sustained Attention: Appears intact Memory: Impaired Memory Impairment: Storage deficit;Retrieval deficit Awareness: Impaired Awareness Impairment: Anticipatory impairment;Emergent impairment Problem Solving: Impaired Problem Solving  Impairment: Verbal basic Safety/Judgment: Impaired       Comprehension  Auditory Comprehension Overall Auditory Comprehension: Appears within functional limits for tasks assessed Commands:  Within Functional Limits Interfering Components: Anxiety Visual Recognition/Discrimination Discrimination: Not tested Reading Comprehension Reading Status:  (TBA)    Expression Expression Primary Mode of Expression: Verbal Verbal Expression Overall Verbal Expression: Appears within functional limits for tasks assessed (intermittent difficulty) Initiation: No impairment Level of Generative/Spontaneous Verbalization: Conversation Repetition: Impaired Level of Impairment: Sentence level Naming: No impairment Pragmatics: No impairment Written Expression Dominant Hand: Right Written Expression:  (TBA)   Oral / Motor  Oral Motor/Sensory Function Overall Oral Motor/Sensory Function: Generalized oral weakness Motor Speech Overall Motor Speech: Appears within functional limits for tasks assessed Respiration: Within functional limits Phonation: Normal Resonance: Within functional limits Articulation: Within functional limitis Intelligibility: Intelligible Motor Planning: Witnin functional limits   GO                    [name] 08/23/2016, 11:24 AM Orbie Pyo Colvin Caroli.Ed [employer] [phone]

## 2016-08-24 ENCOUNTER — Inpatient Hospital Stay (HOSPITAL_COMMUNITY): Payer: Medicare Other

## 2016-08-24 DIAGNOSIS — L03115 Cellulitis of right lower limb: Secondary | ICD-10-CM

## 2016-08-24 DIAGNOSIS — G459 Transient cerebral ischemic attack, unspecified: Secondary | ICD-10-CM | POA: Diagnosis present

## 2016-08-24 DIAGNOSIS — G458 Other transient cerebral ischemic attacks and related syndromes: Secondary | ICD-10-CM

## 2016-08-24 DIAGNOSIS — R911 Solitary pulmonary nodule: Secondary | ICD-10-CM

## 2016-08-24 DIAGNOSIS — I639 Cerebral infarction, unspecified: Secondary | ICD-10-CM

## 2016-08-24 DIAGNOSIS — Z720 Tobacco use: Secondary | ICD-10-CM

## 2016-08-24 LAB — CBC
HCT: 30 % — ABNORMAL LOW (ref 36.0–46.0)
Hemoglobin: 9.3 g/dL — ABNORMAL LOW (ref 12.0–15.0)
MCH: 25.7 pg — ABNORMAL LOW (ref 26.0–34.0)
MCHC: 31 g/dL (ref 30.0–36.0)
MCV: 82.9 fL (ref 78.0–100.0)
Platelets: 296 10*3/uL (ref 150–400)
RBC: 3.62 MIL/uL — ABNORMAL LOW (ref 3.87–5.11)
RDW: 15.2 % (ref 11.5–15.5)
WBC: 6.8 10*3/uL (ref 4.0–10.5)

## 2016-08-24 LAB — BASIC METABOLIC PANEL
Anion gap: 8 (ref 5–15)
BUN: 10 mg/dL (ref 6–20)
CO2: 26 mmol/L (ref 22–32)
Calcium: 8.7 mg/dL — ABNORMAL LOW (ref 8.9–10.3)
Chloride: 106 mmol/L (ref 101–111)
Creatinine, Ser: 0.63 mg/dL (ref 0.44–1.00)
GFR calc Af Amer: >60 mL/min (ref >60)
GFR calc non Af Amer: >60 mL/min (ref >60)
Glucose, Bld: 94 mg/dL (ref 65–99)
Potassium: 4.1 mmol/L (ref 3.5–5.1)
Sodium: 140 mmol/L (ref 135–145)

## 2016-08-24 LAB — HEMOGLOBIN A1C
Hgb A1c MFr Bld: 5.6 % (ref 4.8–5.6)
Mean Plasma Glucose: 114 mg/dL

## 2016-08-24 LAB — SEDIMENTATION RATE: Sed Rate: 50 mm/hr — ABNORMAL HIGH (ref 0–22)

## 2016-08-24 LAB — TSH: TSH: 4.727 u[IU]/mL — ABNORMAL HIGH (ref 0.350–4.500)

## 2016-08-24 LAB — VITAMIN B12: Vitamin B-12: 227 pg/mL (ref 180–914)

## 2016-08-24 LAB — C-REACTIVE PROTEIN: CRP: 1.8 mg/dL — ABNORMAL HIGH (ref <1.0)

## 2016-08-24 MED ORDER — CLOPIDOGREL BISULFATE 75 MG PO TABS: 75.0000 mg | ORAL_TABLET | Freq: Every day | ORAL | 2 refills | Status: DC

## 2016-08-24 MED ORDER — ALBUTEROL SULFATE (2.5 MG/3ML) 0.083% IN NEBU: 2.5000 mg | INHALATION_SOLUTION | Freq: Four times a day (QID) | RESPIRATORY_TRACT | 2 refills | Status: DC | PRN

## 2016-08-24 NOTE — Progress Notes (Signed)
Speech Language Pathology Treatment: Dysphagia;Cognitive-Linquistic  Patient Details Name: [name] Douglas MRN: [id number] DOB: 1940-10-16 Today's Date: 08/24/2016 Time: [id number] SLP Time Calculation (min) (ACUTE ONLY): 17 min  Assessment / Plan / Recommendation Clinical Impression  Pt was seen for skilled ST targeting goals for dysphagia and cognition.  SLP facilitated the session with a trial snack of regular textures and thin liquids.  Pt demonstrated no overt s/s of aspiration with thin liquids via straw or with advanced solids.  Pt was able to clear regular textures from the oral cavity without difficulty.  No dyspnea noted with PO intake, even while pt was eating and talking at the same time.  Recommend diet advancement to regular textures and thin liquids.  Pt was able to recall the due dates of her current bills with mod I and reports that she checks her account balance when her monthly bank statement arrives.  She does not balance a checkbook at home.  Pt reports that she feels her cognition is back to baseline; however, given that pt may be discharging home alone (per pt report) and PT noted ongoing safety awareness issues with mobility, ST follow up in the home health setting may be warranted for pt safety and to maximize functional independence.  Pt verbalized understanding and was in agreement with recommended plan of care.     HPI HPI: DEJIAH RANIERI an 76 y.o.with PMH: anxiety, chronic lower back pain, COPD, GERDR, HTN admitted with headache and slurred speech. CT No change. No acute finding. Normal for age . Focal right middle cerebral bifurcation region calcification as seen previously. MRI pending.      SLP Plan  Continue with current plan of care     Recommendations  Diet recommendations: Regular;Thin liquid Liquids provided via: Straw;Cup Medication Administration: Whole meds with liquid Supervision: Patient able to self feed Compensations: Slow rate;Small  sips/bites Postural Changes and/or Swallow Maneuvers: Out of bed for meals                Oral Care Recommendations: Oral care BID Follow up Recommendations: Home health SLP;Outpatient SLP Plan: Continue with current plan of care       GO                PageSelinda Orion 08/24/2016, 4:24 PM

## 2016-08-24 NOTE — Progress Notes (Signed)
Physical Therapy Treatment Patient Details Name: [name] Douglas MRN: [id number] DOB: 1941/03/07 Today's Date: 08/24/2016    History of Present Illness Patient is a 76 y/o female with hx of Rt TKA in Dec 2017, anxiety, chronic low back pain, COPD, 02 at night and left TKA presents at her doctor's office with slurred speech and global aphasia. NIH 9 in ED. s/p tPA. Head MRI and CT-unremarkable. 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 R/o seizure.    PT Comments    Pt performed increased mobility during treatment.  Remains to present with poor safety awareness and fatigues quickly with mobility.  Continue to recommend rehab in a post acute setting to improve endurance and safety before returning home.   Follow Up Recommendations  CIR     Equipment Recommendations  None recommended by PT    Recommendations for Other Services       Precautions / Restrictions Precautions Precautions: Fall Precaution Comments: watch HR Restrictions Weight Bearing Restrictions: No    Mobility  Bed Mobility Overal bed mobility: Needs Assistance Bed Mobility: Supine to Sit     Supine to sit: HOB elevated;Supervision (use of rails.  )     General bed mobility comments: Pt performed supine to sit without assist and increased time.  Better safety awareness advancing to the edge of the bed.    Transfers Overall transfer level: Needs assistance Equipment used: Rolling walker (2 wheeled) Transfers: Sit to/from Stand Sit to Stand: Min assist         General transfer comment: Cues for hand placement to push from seated surface.  Pt with poor safety awareness with RW to back completely to the seated surface and keep RW in front,  She was observed pushing RW to the side.    Ambulation/Gait Ambulation/Gait assistance: Min guard;Min assist (mild LOB x1 required min assist to correct.  ) Ambulation Distance (Feet): 60 Feet Assistive device:  Rolling walker (2 wheeled) Gait Pattern/deviations: Step-through pattern;Trunk flexed;Drifts right/left;Decreased stride length Gait velocity: decreased   General Gait Details: Short shuffling steps with Min A for balance. Pt required cues for RW safety and to keep RW grounded on floor.  Pt lift RW entirely from floor to turn.     Stairs            Wheelchair Mobility    Modified Rankin (Stroke Patients Only)       Balance Overall balance assessment: Needs assistance   Sitting balance-Leahy Scale: Good       Standing balance-Leahy Scale: Fair                      Cognition                            Exercises      General Comments        Pertinent Vitals/Pain Pain Assessment: Faces Pain Score: 3  Pain Location: head Pain Descriptors / Indicators: Grimacing    Home Living                      Prior Function            PT Goals (current goals can now be found in the care plan section) Acute Rehab PT Goals Patient Stated Goal: to go home Potential to Achieve Goals: Good Progress towards PT goals: Progressing toward goals    Frequency  Min 3X/week      PT Plan Current plan remains appropriate    Co-evaluation             End of Session Equipment Utilized During Treatment: Gait belt Activity Tolerance: Patient limited by fatigue (required seated rest breaks during activity x2.  ) Patient left: in chair;with call bell/phone within reach;with chair alarm set     Time: 1030-1053 PT Time Calculation (min) (ACUTE ONLY): 23 min  Charges:  $Gait Training: 8-22 mins $Therapeutic Activity: 8-22 mins                    G Codes:      Cristela Blue 09/01/2016, 11:04 AM  Governor Rooks, PTA pager [phone]

## 2016-08-24 NOTE — Progress Notes (Signed)
Pt daughter had question concerning note about pulmonary nodule. Dr. Cheral Marker notified and MD spoke with pt daughter to address her questions. MD ordered RN to provide print out information on CT neck angio results to pt family. CT neck angio results printed and handed to pt's daughter. Pt's daughter also informed Cardiology will follow up out patient about the 30 days monitoring per Dr. Erlinda Hong. Pt transported off unit via wheelchair with belongings and family to the side. Delia Heady RN

## 2016-08-24 NOTE — Progress Notes (Signed)
I met with pt at bedside. I discussed that we are recommending [place] for pt is not a candidate for CIR at this time. Does not meet medical necessity for an inpt rehab admission. Pt lives alone and I discussed the possible need for SNF rehab or for her to discuss with one of her daughters to come stay with ne of them for a few weeks. Pt state she will contact her daughter at [facility] to discuss. I updated RN CM and Dr. Erlinda Hong. We will sign off. [phone]

## 2016-08-24 NOTE — Consult Note (Signed)
Physical Medicine and Rehabilitation Consult Reason for Consult: Suspect left brain TIA Referring Physician: Dr.Xu   HPI: [name] Douglas is a 76 y.o. right handed female with history of COPD with daily at bedtime oxygen, chronic low back pain, hypertension, hyperlipidemia. Patient recently discharged from [facility] skilled nursing facility November 2017 after TKA. Per chart review patient lives alone and independent prior to admission . She lives in a senior living apartment facility. She has a daughter who lives in [place] who works. Presented 08/22/2016 with reported headache and aphasia. Cranial CT/MRI scan negative. Patient did receive TPA. Troponin negative. CT angiogram head and neck negative for emergent large vessel occlusion. No significant carotid or left MCA stenosis. Recent echocardiogram with ejection fraction of [id number] grade 1 diastolic dysfunction. EEG negative for seizure. Lower extremity Dopplers pending. Neurology follow-up question left brain TIA with workup ongoing. Currently maintained on Plavix.   Review of Systems  Constitutional: Negative for chills and fever.  HENT: Negative for hearing loss and tinnitus.   Eyes: Negative for blurred vision and double vision.  Respiratory: Positive for cough and shortness of breath.   Cardiovascular: Positive for leg swelling. Negative for chest pain and palpitations.  Gastrointestinal: Positive for constipation. Negative for nausea and vomiting.  Genitourinary: Negative for dysuria and hematuria.  Musculoskeletal: Positive for joint pain and myalgias.  Skin: Negative for rash.  Neurological: Positive for sensory change, weakness and headaches. Negative for seizures.  Psychiatric/Behavioral:       Anxiety  All other systems reviewed and are negative.  Past Medical History:  Diagnosis Date  . Anxiety   . Arthritis    "shoulders" (08/20/2014)  . Asthma   . Chronic lower back pain   . Complication of anesthesia    "agitated & restless after knee replacement in 2008"  . COPD (chronic obstructive pulmonary disease) ([place])   . DDD (degenerative disc disease), lumbar   . Depression   . Diverticulosis   . GERD (gastroesophageal reflux disease)   . High cholesterol   . Hypertension   . Hypothyroidism   . On home oxygen therapy    "2L at night" (08/20/2014)  . Shortness of breath dyspnea    with exertion   Past Surgical History:  Procedure Laterality Date  . CARDIAC CATHETERIZATION  ~ 2007  . EXCISIONAL HEMORRHOIDECTOMY  1960's  . EYE SURGERY Bilateral    Cataract extraction with IOL  . JOINT REPLACEMENT Left 2009  . KNEE ARTHROSCOPY Left X 2 <2008  . KNEE ARTHROSCOPY WITH SUBCHONDROPLASTY Right 04/19/2016   Procedure: KNEE ARTHROSCOPY WITH SUBCHONDROPLASTY, PARTIAL MENISCECTOMY;  Surgeon: Hessie Knows, MD;  Location: [facility] ORS;  Service: Orthopedics;  Laterality: Right;  . SHOULDER OPEN ROTATOR CUFF REPAIR Right 2001  . TONSILLECTOMY  ~ 1950  . TOTAL KNEE ARTHROPLASTY Left 2008  . TOTAL KNEE ARTHROPLASTY Right 07/03/2016   Procedure: TOTAL KNEE ARTHROPLASTY;  Surgeon: Hessie Knows, MD;  Location: [facility] ORS;  Service: Orthopedics;  Laterality: Right;  . TUBAL LIGATION  1973  . VAGINAL HYSTERECTOMY  1974   Family History  Problem Relation Age of Onset  . Diabetes Mother   . Hypertension Mother   . Lung cancer Mother   . Congestive Heart Failure Father   . Congestive Heart Failure Sister    Social History:  reports that she has been smoking Cigarettes.  She has a 26.50 pack-year smoking history. She has never used smokeless tobacco. She reports that she does not drink alcohol  or use drugs. Allergies:  Allergies  Allergen Reactions  . Heparin Nausea And Vomiting   Medications Prior to Admission  Medication Sig Dispense Refill  . albuterol (PROVENTIL HFA;VENTOLIN HFA) 108 (90 BASE) MCG/ACT inhaler Inhale 2 puffs into the lungs every 6 (six) hours as needed for wheezing or shortness of breath. 1  Inhaler 2  . albuterol (PROVENTIL) (2.5 MG/3ML) 0.083% nebulizer solution Take 3 mLs (2.5 mg total) by nebulization 2 (two) times daily. (Patient taking differently: Take 2.5 mg by nebulization every 6 (six) hours as needed for wheezing or shortness of breath. ) 75 mL 12  . aspirin 325 MG tablet Take 325 mg by mouth daily.    Marland Kitchen atorvastatin (LIPITOR) 10 MG tablet Take 10 mg by mouth every evening.    . calcium carbonate (TUMS - DOSED IN MG ELEMENTAL CALCIUM) 500 MG chewable tablet Chew 2 tablets by mouth daily.    . cephALEXin (KEFLEX) 500 MG capsule Take 500 mg by mouth 3 (three) times daily.    . cyclobenzaprine (FLEXERIL) 5 MG tablet Take 5 mg by mouth 2 (two) times daily as needed for muscle spasms.    . diphenhydramine-acetaminophen (TYLENOL PM) 25-500 MG TABS tablet Take 2 tablets by mouth 2 (two) times daily as needed (pain).    Marland Kitchen docusate sodium (COLACE) 100 MG capsule Take 100 mg by mouth at bedtime.    . gabapentin (NEURONTIN) 100 MG capsule Take 100 mg by mouth 3 (three) times daily.    Marland Kitchen HYDROcodone-acetaminophen (NORCO) 10-325 MG tablet Take 1-2 tablets by mouth every 4 (four) hours as needed for moderate pain. 30 tablet 0  . levothyroxine (SYNTHROID, LEVOTHROID) 88 MCG tablet Take 88 mcg by mouth daily before breakfast.    . metoprolol succinate (TOPROL-XL) 50 MG 24 hr tablet Take 50 mg by mouth daily. Take with or immediately following a meal.    . oxyCODONE-acetaminophen (PERCOCET) 7.5-325 MG tablet Take 2 tablets by mouth every 4 (four) hours as needed for moderate pain. 60 tablet 0  . potassium chloride SA (K-DUR,KLOR-CON) 20 MEQ tablet Take 20 mEq by mouth 2 (two) times daily.    . traMADol (ULTRAM) 50 MG tablet Take 2 tablets (100 mg total) by mouth every 6 (six) hours. (Patient taking differently: Take 50 mg by mouth 3 (three) times daily as needed (for pain). ) 30 tablet 1  . venlafaxine XR (EFFEXOR-XR) 75 MG 24 hr capsule Take 3 capsules (225 mg total) by mouth daily with  breakfast. 30 capsule 0    Home: Home Living Family/patient expects to be discharged to:: Private residence Living Arrangements: Alone Available Help at Discharge: Family Type of Home: Apartment Home Access: Stairs to enter, Scientist, research (physical sciences) of Steps: 3 or 5 STE building Entrance Stairs-Rails: Right, Left Home Layout: One level Bathroom Shower/Tub: Tub/shower unit, Architectural technologist: Standard Bathroom Accessibility: Yes Home Equipment: Environmental consultant - 2 wheels, Cane - single point, Shower seat Additional Comments: shower seat is not safe per daughter  Lives With: Alone  Functional History: Prior Function Level of Independence: Independent Comments: Recently d/ced (Nov 2017) from [facility] after TKA and just was getting back to independence at home. Independent for ambulation and ADLs. Functional Status:  Mobility: Bed Mobility Overal bed mobility: Needs Assistance Bed Mobility: Supine to Sit Supine to sit: Min assist, HOB elevated General bed mobility comments: Pt OOB in chair Transfers Overall transfer level: Needs assistance Equipment used: 1 person hand held assist Transfers: Sit to/from Stand Sit to  Stand: Min assist General transfer comment: unsteady with standing Ambulation/Gait Ambulation/Gait assistance: Min assist Ambulation Distance (Feet): 12 Feet Assistive device: 1 person hand held assist Gait Pattern/deviations: Step-to pattern, Decreased step length - right, Decreased step length - left, Narrow base of support, Shuffle General Gait Details: Short shuffling steps with Min A for balance. HR up to 138 bpm. Pt reports feeling hot. Gait velocity: decreased Gait velocity interpretation: <1.8 ft/sec, indicative of risk for recurrent falls    ADL: ADL Overall ADL's : Needs assistance/impaired Grooming: Set up Upper Body Bathing: Set up, Supervision/ safety, Sitting Lower Body Bathing: Minimal assistance, Sit to/from stand Upper Body  Dressing : Supervision/safety, Set up, Sitting Lower Body Dressing: Minimal assistance, Sit to/from stand Toilet Transfer: Minimal assistance Toileting- Clothing Manipulation and Hygiene: Minimal assistance Functional mobility during ADLs: Minimal assistance General ADL Comments: limited due to HR increasingto 138 in standing and complaints of being dizzy  Cognition: Cognition Overall Cognitive Status: Impaired/Different from baseline Arousal/Alertness: Awake/alert Orientation Level: Oriented to person, Oriented to place, Oriented to situation Attention: Sustained Sustained Attention: Appears intact Memory: Impaired Memory Impairment: Storage deficit, Retrieval deficit Awareness: Impaired Awareness Impairment: Anticipatory impairment, Emergent impairment Problem Solving: Impaired Problem Solving Impairment: Verbal basic Safety/Judgment: Impaired Cognition Arousal/Alertness: Awake/alert Behavior During Therapy: Anxious Overall Cognitive Status: Impaired/Different from baseline Area of Impairment: Memory, Orientation, Safety/judgement, Awareness Orientation Level: Disoriented to, Time Following Commands: Follows one step commands with increased time Safety/Judgement: Decreased awareness of deficits Awareness: Emergent Problem Solving: Slow processing, Requires verbal cues General Comments: Family states pt is more confused since she had the "sharp pain in her head". Pt not able to recount information she had been told earlier in the day. Pt confused regarding her dates of surgery for her knee replacement. Pt telling this therapist that hse had surgery in June, but daughters stae she had surgery in November  Blood pressure (!) 113/49, pulse 95, temperature 98.3 F (36.8 C), temperature source Oral, resp. rate 16, SpO2 98 %. Physical Exam  Constitutional: She appears well-developed.  HENT:  Head: Normocephalic.  Eyes: EOM are normal.  Neck: Normal range of motion. Neck supple. No  thyromegaly present.  Cardiovascular: Normal rate and regular rhythm.   Respiratory: Effort normal and breath sounds normal. No respiratory distress.  GI: Soft. Bowel sounds are normal. She exhibits no distension.  Neurological: She is alert.  Makes good eye contact with examiner. Follows simple commands. Speech is fluent. She provides her name and age. No CN findings. Strength 5/5 in all 4. No sensory findings. Stood from chair unsupported. Able to march in place.  Skin: Skin is warm and dry.  Psychiatric: She has a normal mood and affect. Her behavior is normal. Judgment and thought content normal.    Results for orders placed or performed during the hospital encounter of 08/22/16 (from the past 24 hour(s))  CBC     Status: Abnormal   Collection Time: 08/24/16  5:57 AM  Result Value Ref Range   WBC 6.8 4.0 - 10.5 K/uL   RBC 3.62 (L) 3.87 - 5.11 MIL/uL   Hemoglobin 9.3 (L) 12.0 - 15.0 g/dL   HCT 30.0 (L) 36.0 - 46.0 %   MCV 82.9 78.0 - 100.0 fL   MCH 25.7 (L) 26.0 - 34.0 pg   MCHC 31.0 30.0 - 36.0 g/dL   RDW 15.2 11.5 - 15.5 %   Platelets 296 150 - 400 K/uL  Basic metabolic panel     Status: Abnormal   Collection Time:  08/24/16  5:57 AM  Result Value Ref Range   Sodium 140 135 - 145 mmol/L   Potassium 4.1 3.5 - 5.1 mmol/L   Chloride 106 101 - 111 mmol/L   CO2 26 22 - 32 mmol/L   Glucose, Bld 94 65 - 99 mg/dL   BUN 10 6 - 20 mg/dL   Creatinine, Ser 0.63 0.44 - 1.00 mg/dL   Calcium 8.7 (L) 8.9 - 10.3 mg/dL   GFR calc non Af Amer >60 >60 mL/min   GFR calc Af Amer >60 >60 mL/min   Anion gap 8 5 - 15  TSH     Status: Abnormal   Collection Time: 08/24/16  5:57 AM  Result Value Ref Range   TSH 4.727 (H) 0.350 - 4.500 uIU/mL  Vitamin B12     Status: None   Collection Time: 08/24/16  5:57 AM  Result Value Ref Range   Vitamin B-12 227 180 - 914 pg/mL  Sedimentation rate     Status: Abnormal   Collection Time: 08/24/16  5:57 AM  Result Value Ref Range   Sed Rate 50 (H) 0 - 22  mm/hr  C-reactive protein     Status: Abnormal   Collection Time: 08/24/16  5:57 AM  Result Value Ref Range   CRP 1.8 (H) <1.0 mg/dL   Ct Angio Head W Or Wo Contrast  Addendum Date: 08/22/2016   ADDENDUM REPORT: 08/22/2016 14:54 ADDENDUM: #5 discussed by telephone with Dr. Doren Custard STAFFORD on 08/22/2016 At 1441 hours. Electronically Signed   By: Genevie Ann M.D.   On: 08/22/2016 14:54   Result Date: 08/22/2016 CLINICAL DATA:  76 y/o female code stroke. Progressive symptoms since presentation. Abnormal speech beginning at 1025 hours. Right lower extremity weakness and confusion. EXAM: CT ANGIOGRAPHY HEAD AND NECK TECHNIQUE: Multidetector CT imaging of the head and neck was performed using the standard protocol during bolus administration of intravenous contrast. Multiplanar CT image reconstructions and MIPs were obtained to evaluate the vascular anatomy. Carotid stenosis measurements (when applicable) are obtained utilizing NASCET criteria, using the distal internal carotid diameter as the denominator. CONTRAST:  75 mL Isovue 370 COMPARISON:  Head CT without contrast 1123 hours today. Chest CT 08/02/2014. FINDINGS: CTA NECK Skeleton: Absent dentition. Degenerative changes in the cervical spine. No acute osseous abnormality identified. Visualized paranasal sinuses and mastoids are stable and well pneumatized. Upper chest: Peripheral reticular pulmonary opacity in the upper lungs. Left upper lobe 12 mm lung nodule is new since 2015. Mild dilatation of the thoracic esophagus with a small fluid level. Right peritracheal lymph node measuring 9 mm short axis appears increased since 2015. Stable small right thoracic inlet/prevascular lymph node on series 5, image 33. Other neck: Diminutive thyroid. Larynx, pharynx, parapharyngeal spaces, retropharyngeal space (partially retropharyngeal course of both carotids), sublingual space, submandibular glands and parotid glands are within normal limits. No cervical lymphadenopathy.  Aortic arch: 3 vessel arch configuration. Mild to moderate calcified arch atherosclerosis. Right carotid system: No brachiocephalic artery or right CCA origin stenosis despite calcified plaque. Calcified plaque continues in the proximal right CCA which is mildly tortuous. Somewhat early right carotid bifurcation occurring at the level of the larynx. Soft and calcified plaque at the right ICA origin and bulb with no stenosis. Negative cervical right ICA otherwise, partially retropharyngeal. Left carotid system: No left CCA origin despite mild calcified plaque. Bulky calcified plaque along the anterior proximal left CCA is not hemodynamically significant. Left carotid bifurcation at the level of the larynx with soft  and calcified atherosclerosis in the left ICA origin. Stenosis is less than 50 % with respect to the distal vessel. Tortuous cervical left ICA with a partially retropharyngeal course, and a kinked appearance at the C2 level (Series 11, image 26). Vertebral arteries:No proximal right subclavian artery stenosis despite soft and calcified plaque. Calcified plaque at the right vertebral artery origin with moderate to severe stenosis. Otherwise the cervical right vertebral artery is negative. Left subclavian artery origins stenosis up to 63 % with respect to the distal vessel related to origin calcified plaque. Calcified plaque at the left vertebral artery origin with mild to moderate stenosis (series 8, image 160). Both vertebral arteries are somewhat dolichoectatic. The left is otherwise negative to the skullbase. CTA HEAD Posterior circulation: Fairly codominant distal vertebral arteries. Normal right PICA origin. Fenestrated vertebrobasilar junction (normal variant). No distal vertebral or basilar stenosis, with mild dolichoectasia throughout. AICA, SCA and PCA origins are within normal limits. Posterior communicating arteries are diminutive or absent. Bilateral PCA branches are normal. Anterior  circulation: Both ICA siphons are patent. Mild for age siphon calcified plaque without stenosis. Mild dolichoectasia of the left siphon. Normal ophthalmic artery origins. Patent carotid termini. Normal MCA and left ACA origins. Dominant left A1 segment. Diminutive right A1. Anterior communicating artery and visualized ACA branches are normal aside from mild ectasia. Left MCA M1 segment, bifurcation, and left MCA branches are normal aside from mild ectasia. Right MCA M1 segment is patent with calcified plaque distally which does not appear hemodynamically significant. Mild right MCA ectasia. The right MCA bifurcation is patent. There is moderate irregularity at the M2 to M3 branching of the dominant right MCA posterior division. A superior right M3 branch then demonstrates severe stenosis. See series 12, image 14. However, no right MCA branch occlusion is identified. Venous sinuses: Patent. Anatomic variants: Fenestrated vertebrobasilar junction. Dominant left and diminutive right ACA A1 segments. Review of the MIP images confirms the above findings IMPRESSION: 1. Negative for emergent large vessel occlusion. No significant carotid or left MCA stenosis. Generalized intracranial artery dolichoectasia. 2. Positive for moderate to severe distal right MCA posterior division (distal M2 and proximal M3) irregularity and stenosis. 3. Moderate to severe stenoses at both vertebral artery origins. 60-65% stenosis at the left subclavian artery origin. 4. The above was discussed by telephone with Dr. Roland Rack at 1433 hours. on 08/22/2016. 5. Chronic lung disease. New 12 mm left upper lobe pulmonary nodule since a chest CT in December 2015. Consider one of the following in 3 months (a) repeat chest CT, (b) follow-up PET-CT, or (c) tissue sampling. This recommendation follows the consensus statement: Guidelines for Management of Incidental Pulmonary Nodules Detected on CT Images: From the Fleischner Society 2017;  Radiology 2017; 284:228-243. Electronically Signed: By: Genevie Ann M.D. On: 08/22/2016 14:37   Ct Head Wo Contrast  Result Date: 08/22/2016 CLINICAL DATA:  Stroke presentation.  TPA administration.  Emesis. EXAM: CT HEAD WITHOUT CONTRAST TECHNIQUE: Contiguous axial images were obtained from the base of the skull through the vertex without intravenous contrast. COMPARISON:  Earlier same day FINDINGS: Brain: No change. No evidence of atrophy, old or acute infarction, mass lesion, hemorrhage, hydrocephalus or extra-axial collection Vascular: Some residual vascular contrast from previous studies. No acute finding. Calcification at the right MCA bifurcation region as previously described. Skull: Negative Sinuses/Orbits: Negative Other: None IMPRESSION: No change. No acute finding. Normal for age . Focal right middle cerebral bifurcation region calcification as seen previously. Electronically Signed   By: Elta Guadeloupe  Shogry M.D.   On: 08/22/2016 15:44   Ct Angio Neck W And/or Wo Contrast  Addendum Date: 08/22/2016   ADDENDUM REPORT: 08/22/2016 14:54 ADDENDUM: #5 discussed by telephone with Dr. Doren Custard STAFFORD on 08/22/2016 At 1441 hours. Electronically Signed   By: Genevie Ann M.D.   On: 08/22/2016 14:54   Result Date: 08/22/2016 CLINICAL DATA:  76 y/o female code stroke. Progressive symptoms since presentation. Abnormal speech beginning at 1025 hours. Right lower extremity weakness and confusion. EXAM: CT ANGIOGRAPHY HEAD AND NECK TECHNIQUE: Multidetector CT imaging of the head and neck was performed using the standard protocol during bolus administration of intravenous contrast. Multiplanar CT image reconstructions and MIPs were obtained to evaluate the vascular anatomy. Carotid stenosis measurements (when applicable) are obtained utilizing NASCET criteria, using the distal internal carotid diameter as the denominator. CONTRAST:  75 mL Isovue 370 COMPARISON:  Head CT without contrast 1123 hours today. Chest CT 08/02/2014.  FINDINGS: CTA NECK Skeleton: Absent dentition. Degenerative changes in the cervical spine. No acute osseous abnormality identified. Visualized paranasal sinuses and mastoids are stable and well pneumatized. Upper chest: Peripheral reticular pulmonary opacity in the upper lungs. Left upper lobe 12 mm lung nodule is new since 2015. Mild dilatation of the thoracic esophagus with a small fluid level. Right peritracheal lymph node measuring 9 mm short axis appears increased since 2015. Stable small right thoracic inlet/prevascular lymph node on series 5, image 33. Other neck: Diminutive thyroid. Larynx, pharynx, parapharyngeal spaces, retropharyngeal space (partially retropharyngeal course of both carotids), sublingual space, submandibular glands and parotid glands are within normal limits. No cervical lymphadenopathy. Aortic arch: 3 vessel arch configuration. Mild to moderate calcified arch atherosclerosis. Right carotid system: No brachiocephalic artery or right CCA origin stenosis despite calcified plaque. Calcified plaque continues in the proximal right CCA which is mildly tortuous. Somewhat early right carotid bifurcation occurring at the level of the larynx. Soft and calcified plaque at the right ICA origin and bulb with no stenosis. Negative cervical right ICA otherwise, partially retropharyngeal. Left carotid system: No left CCA origin despite mild calcified plaque. Bulky calcified plaque along the anterior proximal left CCA is not hemodynamically significant. Left carotid bifurcation at the level of the larynx with soft and calcified atherosclerosis in the left ICA origin. Stenosis is less than 50 % with respect to the distal vessel. Tortuous cervical left ICA with a partially retropharyngeal course, and a kinked appearance at the C2 level (Series 11, image 26). Vertebral arteries:No proximal right subclavian artery stenosis despite soft and calcified plaque. Calcified plaque at the right vertebral artery origin  with moderate to severe stenosis. Otherwise the cervical right vertebral artery is negative. Left subclavian artery origins stenosis up to 63 % with respect to the distal vessel related to origin calcified plaque. Calcified plaque at the left vertebral artery origin with mild to moderate stenosis (series 8, image 160). Both vertebral arteries are somewhat dolichoectatic. The left is otherwise negative to the skullbase. CTA HEAD Posterior circulation: Fairly codominant distal vertebral arteries. Normal right PICA origin. Fenestrated vertebrobasilar junction (normal variant). No distal vertebral or basilar stenosis, with mild dolichoectasia throughout. AICA, SCA and PCA origins are within normal limits. Posterior communicating arteries are diminutive or absent. Bilateral PCA branches are normal. Anterior circulation: Both ICA siphons are patent. Mild for age siphon calcified plaque without stenosis. Mild dolichoectasia of the left siphon. Normal ophthalmic artery origins. Patent carotid termini. Normal MCA and left ACA origins. Dominant left A1 segment. Diminutive right A1. Anterior communicating artery  and visualized ACA branches are normal aside from mild ectasia. Left MCA M1 segment, bifurcation, and left MCA branches are normal aside from mild ectasia. Right MCA M1 segment is patent with calcified plaque distally which does not appear hemodynamically significant. Mild right MCA ectasia. The right MCA bifurcation is patent. There is moderate irregularity at the M2 to M3 branching of the dominant right MCA posterior division. A superior right M3 branch then demonstrates severe stenosis. See series 12, image 14. However, no right MCA branch occlusion is identified. Venous sinuses: Patent. Anatomic variants: Fenestrated vertebrobasilar junction. Dominant left and diminutive right ACA A1 segments. Review of the MIP images confirms the above findings IMPRESSION: 1. Negative for emergent large vessel occlusion. No  significant carotid or left MCA stenosis. Generalized intracranial artery dolichoectasia. 2. Positive for moderate to severe distal right MCA posterior division (distal M2 and proximal M3) irregularity and stenosis. 3. Moderate to severe stenoses at both vertebral artery origins. 60-65% stenosis at the left subclavian artery origin. 4. The above was discussed by telephone with Dr. Roland Rack at 1433 hours. on 08/22/2016. 5. Chronic lung disease. New 12 mm left upper lobe pulmonary nodule since a chest CT in December 2015. Consider one of the following in 3 months (a) repeat chest CT, (b) follow-up PET-CT, or (c) tissue sampling. This recommendation follows the consensus statement: Guidelines for Management of Incidental Pulmonary Nodules Detected on CT Images: From the Fleischner Society 2017; Radiology 2017; 284:228-243. Electronically Signed: By: Genevie Ann M.D. On: 08/22/2016 14:37   Mr Brain Wo Contrast  Result Date: 08/23/2016 CLINICAL DATA:  Aphasia. EXAM: MRI HEAD WITHOUT CONTRAST TECHNIQUE: Multiplanar, multiecho pulse sequences of the brain and surrounding structures were obtained without intravenous contrast. COMPARISON:  08/22/2016 head CT FINDINGS: Brain: There is no evidence of acute infarct, intracranial hemorrhage, mass, midline shift, or extra-axial fluid collection. The ventricles and sulci are normal for age. Patchy T2 hyperintensities in the cerebral white matter are nonspecific but compatible with chronic small vessel ischemic disease, relatively mild for age. Moderate chronic small vessel ischemic changes are present in the pons. Vascular: Major intracranial vascular flow voids are preserved. Skull and upper cervical spine: Unremarkable bone marrow signal. Sinuses/Orbits: Prior bilateral cataract extraction. No significant sinus disease. Other: None. IMPRESSION: 1. No acute intracranial abnormality. 2. Mild-to-moderate chronic small vessel ischemic disease. Electronically Signed   By:  Logan Bores M.D.   On: 08/23/2016 12:09   Ct Head Code Stroke W/o Cm  Result Date: 08/22/2016 CLINICAL DATA:  Code stroke.  Speech change.  Weakness. EXAM: CT HEAD WITHOUT CONTRAST TECHNIQUE: Contiguous axial images were obtained from the base of the skull through the vertex without intravenous contrast. COMPARISON:  CT head 08/02/2016 FINDINGS: Brain: Negative for acute infarct. Negative for hemorrhage or mass. Mild chronic microvascular ischemic change in the white matter. Vascular: Atherosclerotic calcification in the distal right M1 segment unchanged from the prior study. Negative for hyperdense MCA. Skull: Negative Sinuses/Orbits: Negative Other: None ASPECTS ([place] Stroke Program Early CT Score) - Ganglionic level infarction (caudate, lentiform nuclei, internal capsule, insula, M1-M3 cortex): 7 - Supraganglionic infarction (M4-M6 cortex): 3 Total score (0-10 with 10 being normal): 10 IMPRESSION: 1. No acute intracranial abnormality. 2. ASPECTS is 10 These results were called by telephone at the time of interpretation on 08/22/2016 at 11:34 am to Dr. Joni Fears, who verbally acknowledged these results. 3. Atherosclerotic calcification distal right M1 segment. Electronically Signed   By: Franchot Gallo M.D.   On: 08/22/2016 11:36  Assessment/Plan: Diagnosis: TIA, pt getting close to baseline 1. Does the need for close, 24 hr/day medical supervision in concert with the patient's rehab needs make it unreasonable for this patient to be served in a less intensive setting? No 2. Co-Morbidities requiring supervision/potential complications:   3. Due to bladder management, bowel management, safety, skin/wound care, disease management, medication administration, pain management and patient education, does the patient require 24 hr/day rehab nursing? No 4. Does the patient require coordinated care of a physician, rehab nurse, n/a to address physical and functional deficits in the context of the above medical  diagnosis(es)? No Addressing deficits in the following areas: balance, endurance and locomotion 5. Can the patient actively participate in an intensive therapy program of at least 3 hrs of therapy per day at least 5 days per week? N/A 6. The potential for patient to make measurable gains while on inpatient rehab is n/a 7. Anticipated functional outcomes upon discharge from inpatient rehab are n/a  with PT, n/a with OT, n/a with SLP. 8. Estimated rehab length of stay to reach the above functional goals is: n/a 9. Does the patient have adequate social supports and living environment to accommodate these discharge functional goals? N/A 10. Anticipated D/C setting: Home 11. Anticipated post D/C treatments: [place] therapy 12. Overall Rehab/Functional Prognosis: excellent  RECOMMENDATIONS: This patient's condition is appropriate for continued rehabilitative care in the following setting: [facility] Patient has agreed to participate in recommended program. Yes Note that insurance prior authorization may be required for reimbursement for recommended care.  Comment: Pt is nearly back to baseline  Meredith Staggers, MD, [facility] 08/24/2016    Cathlyn Parsons., PA-C 08/24/2016

## 2016-08-24 NOTE — Care Management Note (Signed)
Case Management Note  Patient Details  Name: [name] Douglas MRN: [id number] Date of Birth: 1941-03-03  Subjective/Objective:   Pt admitted to r/o CVA. She is from senior independent apartment.                 Action/Plan: Pt discharging home with orders to resume [facility] services. Pt was active with [facility] prior to admission. CM faxed orders for resumption of care to [facility] at: [phone]. CM encouraged pt to have 24 hour care at discharge. Pt states she will consider going to her daughters home. Pt states her daughter is going to provide transportation home.   Expected Discharge Date:                  Expected Discharge Plan:  [place]  In-House Referral:     Discharge planning Services  CM Consult  Post Acute Care Choice:  Home Health, Resumption of Svcs/PTA Provider Choice offered to:  Patient  DME Arranged:    DME Agency:     HH Arranged:  OT, PT [place] Agency:  [place]  Status of Service:  Completed, signed off  If discussed at [place] of Stay Meetings, dates discussed:    Additional Comments:  Pollie Friar, RN 08/24/2016, 7:50 PM

## 2016-08-24 NOTE — Progress Notes (Signed)
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 NIH 9 in ED. s/p tPA. Head MRI and CT-unremarkable. 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 R/o seizure.   OT comments  Pt progressing toward OT goals. Pt continues to demonstrate poor safety awareness with all ADL and functional mobility. At one point pt walking with 2 wheels of RW off the ground and 2 wheels on the ground. She required verbal cues to notice this and unable to self-correct. Additionally, pt attempted to move away from sink to answer phone without RW and having LOBx1 and able to correct with min assist. Pt required min guard assist throughout to maintain safety. Pt will not have 24 hour assistance at home and feel her current limitations make her unsafe without 24 hour supervision. Thus, updating D/C recommendations to SNF for short-term rehabilitation to ensure safe D/C home and facilitate return to PLOF.   Follow Up Recommendations  Supervision/Assistance - 24 hour;SNF    Equipment Recommendations  None recommended by OT    Recommendations for Other Services      Precautions / Restrictions Precautions Precautions: Fall Precaution Comments: watch HR Restrictions Weight Bearing Restrictions: No       Mobility Bed Mobility Overal bed mobility: Needs Assistance Bed Mobility: Supine to Sit;Sit to Supine     Supine to sit: HOB elevated;Supervision Sit to supine: Supervision;HOB elevated      Transfers Overall transfer level: Needs assistance Equipment used: Rolling walker (2 wheeled) Transfers: Sit to/from Stand Sit to Stand: Min guard         General  transfer comment: Poor safety awareness with RW during transfers.    Balance Overall balance assessment: Needs assistance Sitting-balance support: Feet supported;No upper extremity supported Sitting balance-Leahy Scale: Good     Standing balance support: During functional activity;No upper extremity supported;Bilateral upper extremity supported Standing balance-Leahy Scale: Fair Standing balance comment: Able to stand with no UE support at sink. Requires UE support for stability with ambulation.                   ADL Overall ADL's : Needs assistance/impaired     Grooming: Cueing for safety;Standing;Min guard Grooming Details (indicate cue type and reason): Cueing required for safety while standing at the sink. Pt attempting to wakl away from sink without RW. Upper Body Bathing: Set up;Supervision/ safety;Sitting   Lower Body Bathing: Min guard;Sit to/from stand   Upper Body Dressing : Supervision/safety;Set up;Sitting   Lower Body Dressing: Min guard;Sit to/from stand Lower Body Dressing Details (indicate cue type and reason): Able to don/doff socks without physical assist. Toilet Transfer: Min guard;Cueing for safety;Cueing for sequencing;RW;Regular Toilet Toilet Transfer Details (indicate cue type and reason): Min guard for safety and pt impulsive with all activities. Unsafe with RW. Toileting- Clothing Manipulation and Hygiene: Supervision/safety;Sitting/lateral lean       Functional mobility during ADLs: Min guard;Rolling walker;Cueing for safety;Cueing for sequencing General ADL Comments: Pt tolerated standing ADL well but demonstrates poor safety awareness. Required verbal cues for safety and demonstrated emergent awareness during session. Requiring cues to anticipate needs for ADL and functional mobility.      Vision  Perception     Praxis      Cognition   Behavior During Therapy: Anxious Overall Cognitive Status:  Impaired/Different from baseline Area of Impairment: Memory;Orientation;Safety/judgement;Awareness;Problem solving Orientation Level: Disoriented to;Situation   Memory: Decreased short-term memory  Following Commands: Follows one step commands with increased time;Follows multi-step commands inconsistently Safety/Judgement: Decreased awareness of deficits Awareness: Emergent (VC's to anticipate needs) Problem Solving: Slow processing;Requires verbal cues General Comments: Pt more clear this session but remains with decreased short-term memory, impaired recall of past events concerning CVA, and recalling reports from MD differing from chart. Pt with poor safety awareness holding 2 wheels of the walker off of the ground during ambulation without self-correcting.    Extremity/Trunk Assessment               Exercises     Shoulder Instructions       General Comments      Pertinent Vitals/ Pain       Pain Assessment: No/denies pain  Home Living                                          Prior Functioning/Environment              Frequency  Min 2X/week        Progress Toward Goals  OT Goals(current goals can now be found in the care plan section)  Progress towards OT goals: Progressing toward goals  Acute Rehab OT Goals Patient Stated Goal: to go home OT Goal Formulation: With patient Time For Goal Achievement: 09/06/16 Potential to Achieve Goals: Good ADL Goals Pt Will Perform Lower Body Bathing: with modified independence;sit to/from stand Pt Will Perform Lower Body Dressing: with modified independence;sit to/from stand Pt Will Transfer to Toilet: with modified independence;ambulating Additional ADL Goal #1: Pt will demonstrate anticipatory awreness during ADL task  Plan Discharge plan needs to be updated    Co-evaluation                 End of Session Equipment Utilized During Treatment: Gait belt;Rolling walker   Activity  Tolerance Patient tolerated treatment well   Patient Left in bed;with call bell/phone within reach;with nursing/sitter in room;Other (comment) (IV team removing PICC line)   Nurse Communication          Time: [id number] OT Time Calculation (min): 27 min  Charges: OT General Charges $OT Visit: 1 Procedure OT Treatments $Self Care/Home Management : 23-37 mins  Norman Herrlich, OTR/L [phone] 08/24/2016, 2:58 PM

## 2016-08-24 NOTE — Progress Notes (Signed)
*  Preliminary Results* Bilateral lower extremity venous duplex completed. Bilateral lower extremities are negative for deep vein thrombosis. There is no evidence of Baker's cyst bilaterally.  08/24/2016 2:40 PM Maudry Mayhew, BS, RVT, RDCS, RDMS

## 2016-08-24 NOTE — Progress Notes (Signed)
Inpatient Rehabilitation  I appreciate input from PT and OT.  Recommending an IP Rehab consult order at this time.  Please call with questions.   Carmelia Roller., CCC/SLP Admission Coordinator  [place]  Cell [phone]

## 2016-08-24 NOTE — Discharge Summary (Addendum)
Stroke Discharge Summary  Patient ID: [name] Douglas   MRN: [id number]      DOB: 1940-09-18  Date of Admission: 08/22/2016 Date of Discharge: 08/24/2016  Attending Physician:  Rosalin Hawking, MD, Stroke MD Consulting Physician(s):     Alger Simons, MD (Physical Medicine & Rehabtilitation)  Patient's PCP:  Lavera Guise, MD  DISCHARGE DIAGNOSIS:  Principal Problem:   L brain TIA (transient ischemic attack) Active Problems:   HTN (hypertension)   HLD (hyperlipidemia)   Hypothyroidism   Tobacco abuse   COPD (chronic obstructive pulmonary disease) ([place])   Cellulitis of right knee   Aphasia   Solitary pulmonary nodule   Past Medical History:  Diagnosis Date  . Anxiety   . Arthritis    "shoulders" (08/20/2014)  . Asthma   . Chronic lower back pain   . Complication of anesthesia    "agitated & restless after knee replacement in 2008"  . COPD (chronic obstructive pulmonary disease) ([place])   . DDD (degenerative disc disease), lumbar   . Depression   . Diverticulosis   . GERD (gastroesophageal reflux disease)   . High cholesterol   . Hypertension   . Hypothyroidism   . On home oxygen therapy    "2L at night" (08/20/2014)  . Shortness of breath dyspnea    with exertion   Past Surgical History:  Procedure Laterality Date  . CARDIAC CATHETERIZATION  ~ 2007  . EXCISIONAL HEMORRHOIDECTOMY  1960's  . EYE SURGERY Bilateral    Cataract extraction with IOL  . JOINT REPLACEMENT Left 2009  . KNEE ARTHROSCOPY Left X 2 <2008  . KNEE ARTHROSCOPY WITH SUBCHONDROPLASTY Right 04/19/2016   Procedure: KNEE ARTHROSCOPY WITH SUBCHONDROPLASTY, PARTIAL MENISCECTOMY;  Surgeon: Hessie Knows, MD;  Location: [facility] ORS;  Service: Orthopedics;  Laterality: Right;  . SHOULDER OPEN ROTATOR CUFF REPAIR Right 2001  . TONSILLECTOMY  ~ 1950  . TOTAL KNEE ARTHROPLASTY Left 2008  . TOTAL KNEE ARTHROPLASTY Right 07/03/2016   Procedure: TOTAL KNEE ARTHROPLASTY;  Surgeon: Hessie Knows, MD;  Location: [facility] ORS;   Service: Orthopedics;  Laterality: Right;  . TUBAL LIGATION  1973  . VAGINAL HYSTERECTOMY  1974    Allergies as of 08/24/2016      Reactions   Heparin Nausea And Vomiting      Medication List    STOP taking these medications   aspirin 325 MG tablet   cephALEXin 500 MG capsule Commonly known as:  KEFLEX   potassium chloride SA 20 MEQ tablet Commonly known as:  K-DUR,KLOR-CON     TAKE these medications   albuterol 108 (90 Base) MCG/ACT inhaler Commonly known as:  PROVENTIL HFA;VENTOLIN HFA Inhale 2 puffs into the lungs every 6 (six) hours as needed for wheezing or shortness of breath.   albuterol (2.5 MG/3ML) 0.083% nebulizer solution Commonly known as:  PROVENTIL Take 3 mLs (2.5 mg total) by nebulization every 6 (six) hours as needed for wheezing or shortness of breath.   atorvastatin 10 MG tablet Commonly known as:  LIPITOR Take 10 mg by mouth every evening.   calcium carbonate 500 MG chewable tablet Commonly known as:  TUMS - dosed in mg elemental calcium Chew 2 tablets by mouth daily.   clopidogrel 75 MG tablet Commonly known as:  PLAVIX Take 1 tablet (75 mg total) by mouth daily. Start taking on:  08/25/2016   cyclobenzaprine 5 MG tablet Commonly known as:  FLEXERIL Take 5 mg by mouth 2 (two) times daily as  needed for muscle spasms.   diphenhydramine-acetaminophen 25-500 MG Tabs tablet Commonly known as:  TYLENOL PM Take 2 tablets by mouth 2 (two) times daily as needed (pain).   docusate sodium 100 MG capsule Commonly known as:  COLACE Take 100 mg by mouth at bedtime.   gabapentin 100 MG capsule Commonly known as:  NEURONTIN Take 100 mg by mouth 3 (three) times daily.   HYDROcodone-acetaminophen 10-325 MG tablet Commonly known as:  NORCO Take 1-2 tablets by mouth every 4 (four) hours as needed for moderate pain.   levothyroxine 88 MCG tablet Commonly known as:  SYNTHROID, LEVOTHROID Take 88 mcg by mouth daily before breakfast.   metoprolol succinate  50 MG 24 hr tablet Commonly known as:  TOPROL-XL Take 50 mg by mouth daily. Take with or immediately following a meal.   oxyCODONE-acetaminophen 7.5-325 MG tablet Commonly known as:  PERCOCET Take 2 tablets by mouth every 4 (four) hours as needed for moderate pain.   traMADol 50 MG tablet Commonly known as:  ULTRAM Take 2 tablets (100 mg total) by mouth every 6 (six) hours. What changed:  how much to take  when to take this  reasons to take this   venlafaxine XR 75 MG 24 hr capsule Commonly known as:  EFFEXOR-XR Take 3 capsules (225 mg total) by mouth daily with breakfast.       LABORATORY STUDIES CBC    Component Value Date/Time   WBC 6.8 08/24/2016 0557   RBC 3.62 (L) 08/24/2016 0557   HGB 9.3 (L) 08/24/2016 0557   HGB 10.1 (L) 08/05/2014 0744   HCT 30.0 (L) 08/24/2016 0557   HCT 28.2 (L) 08/04/2014 0356   PLT 296 08/24/2016 0557   PLT 285 08/04/2014 0356   MCV 82.9 08/24/2016 0557   MCV 91 08/04/2014 0356   MCH 25.7 (L) 08/24/2016 0557   MCHC 31.0 08/24/2016 0557   RDW 15.2 08/24/2016 0557   RDW 14.9 (H) 08/04/2014 0356   LYMPHSABS 0.8 (L) 08/22/2016 1145   LYMPHSABS 1.4 08/04/2014 0356   MONOABS 0.6 08/22/2016 1145   MONOABS 0.7 08/04/2014 0356   EOSABS 0.8 (H) 08/22/2016 1145   EOSABS 0.2 08/04/2014 0356   BASOSABS 0.0 08/22/2016 1145   BASOSABS 0.0 08/04/2014 0356   CMP    Component Value Date/Time   NA 140 08/24/2016 0557   NA 143 08/04/2014 0356   K 4.1 08/24/2016 0557   K 4.0 08/07/2014 1123   CL 106 08/24/2016 0557   CL 110 (H) 08/04/2014 0356   CO2 26 08/24/2016 0557   CO2 28 08/04/2014 0356   GLUCOSE 94 08/24/2016 0557   GLUCOSE 99 08/04/2014 0356   BUN 10 08/24/2016 0557   BUN 3 (L) 08/04/2014 0356   CREATININE 0.63 08/24/2016 0557   CREATININE 0.69 08/08/2014 0358   CALCIUM 8.7 (L) 08/24/2016 0557   CALCIUM 7.7 (L) 08/04/2014 0356   PROT 8.6 (H) 08/22/2016 1145   PROT 5.8 (L) 08/02/2014 1948   ALBUMIN 3.7 08/22/2016 1145    ALBUMIN 2.5 (L) 08/02/2014 1948   AST 22 08/22/2016 1145   AST 31 08/02/2014 1948   ALT 8 (L) 08/22/2016 1145   ALT 27 08/02/2014 1948   ALKPHOS 102 08/22/2016 1145   ALKPHOS 100 08/02/2014 1948   BILITOT 0.5 08/22/2016 1145   BILITOT 0.4 08/02/2014 1948   GFRNONAA >60 08/24/2016 0557   GFRNONAA >60 08/08/2014 0358   GFRNONAA >60 08/23/2013 1439   GFRAA >60 08/24/2016 0557   GFRAA >  60 08/08/2014 0358   GFRAA >60 08/23/2013 1439   COAGS Lab Results  Component Value Date   INR 1.05 08/22/2016   INR 1.11 07/17/2016   INR 0.93 06/27/2016   Lipid Panel    Component Value Date/Time   CHOL 136 08/23/2016 0508   TRIG 101 08/23/2016 0508   HDL 48 08/23/2016 0508   CHOLHDL 2.8 08/23/2016 0508   VLDL 20 08/23/2016 0508   LDLCALC 68 08/23/2016 0508   HgbA1C  Lab Results  Component Value Date   HGBA1C 5.6 08/23/2016    Recent Labs  08/22/16 1145  TROPONINI <0.03   Urinalysis    Component Value Date/Time   COLORURINE YELLOW (A) 07/18/2016 0425   APPEARANCEUR CLOUDY (A) 07/18/2016 0425   APPEARANCEUR Clear 08/02/2014 2331   LABSPEC 1.015 07/18/2016 0425   LABSPEC 1.035 08/02/2014 2331   PHURINE 6.0 07/18/2016 0425   GLUCOSEU NEGATIVE 07/18/2016 0425   GLUCOSEU Negative 08/02/2014 2331   HGBUR NEGATIVE 07/18/2016 0425   BILIRUBINUR NEGATIVE 07/18/2016 0425   BILIRUBINUR Negative 08/02/2014 2331   KETONESUR NEGATIVE 07/18/2016 0425   PROTEINUR NEGATIVE 07/18/2016 0425   UROBILINOGEN 1.0 08/20/2014 2125   NITRITE NEGATIVE 07/18/2016 0425   LEUKOCYTESUR TRACE (A) 07/18/2016 0425   LEUKOCYTESUR Negative 08/02/2014 2331    SIGNIFICANT DIAGNOSTIC STUDIES Ct Head Code Stroke W/o Cm 08/22/2016 1. No acute intracranial abnormality. 2. ASPECTS is 10   Ct Angio Head W Or Wo Contrast Ct Angio Neck W And/or Wo Contrast 08/22/2016 1. Negative for emergent large vessel occlusion. No significant carotid or left MCA stenosis. Generalized intracranial artery dolichoectasia. 2.  Positive for moderate to severe distal right MCA posterior division (distal M2 and proximal M3) irregularity and stenosis. 3. Moderate to severe stenoses at both vertebral artery origins. 60-65% stenosis at the left subclavian artery origin. 4. Chronic lung disease. 5. New 12 mm left upper lobe pulmonary nodule since a chest CT in December 2015.   Ct Head Wo Contrast 08/22/2016 No change. No acute finding. Normal for age . Focal right middle cerebral bifurcation region calcification as seen previously.   2D Echocardiogram  - Left ventricle: The cavity size was normal. Systolic function wasnormal. The estimated ejection fraction was 60%. Dopplerparameters are consistent with abnormal left ventricularrelaxation (grade 1 diastolic dysfunction). - Aortic valve: There was mild regurgitation. Valve area (VTI):1.76 cm^2. Valve area (Vmax): 1.49 cm^2. Valve area (Vmean): 1.57cm^2. - Mitral valve: There was mild regurgitation. - Right ventricle: The cavity size was mildly dilated. - Right atrium: The atrium was mildly dilated. - Atrial septum: No defect or patent foramen ovale was identified. - Pulmonary arteries: PA peak pressure: 50 mm Hg (S). Impressions: - The right ventricular systolic pressure was increased consistentwith severe pulmonary hypertension. Normal left ventricularsystolic function with mild diastolic dysfunction and febrilecalcified aortic valve with mild mitral regurgitation and mildtricuspid regurgitation. No vegetation seen.  Mr Brain Wo Contrast 08/23/2016 IMPRESSION: 1. No acute intracranial abnormality. 2. Mild-to-moderate chronic small vessel ischemic disease.   TTE 07/20/16 The right ventricular systolic pressure was increased consistent with severe pulmonary hypertension. Normal left ventricular systolic function with mild diastolic dysfunction and febrile calcified aortic valve with mild mitral regurgitation and mild tricuspid regurgitation. No vegetation  seen.  EEG  This normal EEG is recorded in the waking and drowsy state. There was no seizure or seizure predisposition recorded on this study.  LE venous doppler  negative for deep vein thrombosis. There is no evidence of Baker's cyst bilaterally.  HISTORY OF PRESENT ILLNESS [name] Douglas an 76 y.o.femalewith multiple medical problems who has been under the care of infectious disease at [facility] for postoperative infection of her knee. Patient apparently complained of a headache last evening but was relieved with Tylenol. This a.m. she went to go to the doctor's appointment and when she spoke with her ex-husband she was at baseline. When he came to pick her up at 1025 she had slurred speech. She was then taken to the doctor who sent her to the ED. Upon entering ED she had an NIH stroke scale of 9 and was both expressively and receptively a phasic. Patient received TPA and was sent to [facility] for further evaluation and care at the ICU. On arrival patient currently is both receptively and expressively a phasic. While in the ICU patient was noted to have bouts of emesis which was concerning for possible intracranial abnormalities and a stat CT was ordered. She was last known well: Date at 0900 on 08/22/2016. Patient was administered IV t-PA. She was admitted to the neuro ICU for further evaluation and treatment.    HOSPITAL COURSE [name] Douglas is a 76 y.o. female with history of multiple medical problems presenting with expressive and receptive aphasia, R sided weakness. She received IV t-PA 1/3/201 at 1200.   TIA:  L brain TIA s/p IV tPA, etiology unclear.  Resultant symptoms resolved.  CTA head / neck no large vessel occlusion, B VA origin stenosis, L SCA stenosis  MRI  No acute stroke  LE doppler  negatiave  EEG no seizure  2D Echo 07/20/16  EF 60%. No source of embolus   Pt needs 30 day cardiac event monitoring as outpt to rule out afib - asked  cardiology to arrange  LDL 68  HgbA1c 5.6  ASA prior to admission, now on plavix for stroke prevention. Continue plavix for stroke prevention.   Ongoing aggressive stroke risk factor management  Therapy recommendations:  SNF (lack of 24h care) daughter wants to take home  Disposition: return home, resume [facility] RN, PT and OT at home as PTA. Care Management Notified  Hypertension  Stable  Long-term BP goal normotensive  Hyperlipidemia  Home meds:  lipitor 10, resumed in hospital  LDL 68, at goal < 70  Continue statin at discharge  Tobacco abuse  Current smoker  Smoking cessation counseling provided  Pt is willing to quit  Other Stroke Risk Factors  Advanced age  Other Active Problems  37mm LUL pulmonary nodule, needs followup  Knee infection - s/p IV abx for 4 weeks. Course completed. PICC line removed  COPD, on home O2  Hypothyroid, on synthroid   DISCHARGE EXAM Blood pressure (!) 139/41, pulse 95, temperature 97.8 F (36.6 C), temperature source Oral, resp. rate 18, SpO2 100 %. General - Well nourished, well developed, in no apparent distress.  Ophthalmologic - Fundi not visualized due to eye movement.  Cardiovascular - Regular rate and rhythm.  Mental Status -  Level of arousal and orientation to time, place, and person were intact. Language including expression, naming, repetition, comprehension was assessed and found intact. Fund of Knowledge was assessed and was impaired.  Cranial Nerves II - XII - II - Visual field intact OU. III, IV, VI - Extraocular movements intact. V - Facial sensation intact bilaterally. VII - Facial movement intact bilaterally. VIII - Hearing & vestibular intact bilaterally. X - Palate elevates symmetrically. XI - Chin turning & shoulder shrug intact bilaterally. XII -  Tongue protrusion intact.  Motor Strength - The patient's strength was normal in all extremities and pronator drift was absent.  Bulk was  normal and fasciculations were absent.   Motor Tone - Muscle tone was assessed at the neck and appendages and was normal.  Reflexes - The patient's reflexes were 1+ in all extremities and she had no pathological reflexes.  Sensory - Light touch, temperature/pinprick were assessed and were symmetrical.    Coordination - The patient had normal movements in the hands with no ataxia or dysmetria.  Essential tremor presented on the left hand  Gait and Station - deferred.  Discharge Diet   Diet regular Room service appropriate? Yes; Fluid consistency: Thin liquids  DISCHARGE PLAN  Disposition:  Home with [facility] HH RN, PT and OT (resumed as PTA)  30d monitor to look for atrial fibrillation   clopidogrel 75 mg daily for secondary stroke prevention.  Ongoing risk factor control by Primary Care Physician at time of discharge  Follow-up Lavera Guise, MD in 2 weeks.  Follow-up with Dr. Rosalin Hawking, Stroke Clinic in 6 weeks, office to schedule an appointment.  45 minutes were spent preparing discharge.  Rosalin Hawking, MD PhD Stroke Neurology 08/24/2016 11:43 PM

## 2016-08-24 NOTE — Progress Notes (Signed)
Patient's daughter(Shannon) is in her room at bedside this AM, requests that her bed alarm be turned off. Daughter states 5 times that she works at a hospital and knows about bed alarms and patient's rights. Daughter and patient are  educated about the importance of bed alarm. Encouraged daughter NOT to leave pt's side at any time without letting staff know to turn bed alarm back on. Will continue to monitor.

## 2016-08-24 NOTE — Progress Notes (Signed)
Discharge instructions given. Pt verbalized understanding and all questions were answered.  

## 2016-08-26 DIAGNOSIS — Z452 Encounter for adjustment and management of vascular access device: Secondary | ICD-10-CM | POA: Diagnosis not present

## 2016-08-26 DIAGNOSIS — J449 Chronic obstructive pulmonary disease, unspecified: Secondary | ICD-10-CM | POA: Diagnosis not present

## 2016-08-26 DIAGNOSIS — I10 Essential (primary) hypertension: Secondary | ICD-10-CM | POA: Diagnosis not present

## 2016-08-26 DIAGNOSIS — J45909 Unspecified asthma, uncomplicated: Secondary | ICD-10-CM | POA: Diagnosis not present

## 2016-08-26 DIAGNOSIS — T8453XA Infection and inflammatory reaction due to internal right knee prosthesis, initial encounter: Secondary | ICD-10-CM | POA: Diagnosis not present

## 2016-08-26 DIAGNOSIS — F419 Anxiety disorder, unspecified: Secondary | ICD-10-CM | POA: Diagnosis not present

## 2016-08-27 ENCOUNTER — Other Ambulatory Visit: Payer: Self-pay | Admitting: Cardiology

## 2016-08-27 DIAGNOSIS — I4891 Unspecified atrial fibrillation: Secondary | ICD-10-CM

## 2016-08-28 ENCOUNTER — Emergency Department: Payer: Medicare Other

## 2016-08-28 ENCOUNTER — Emergency Department
Admission: EM | Admit: 2016-08-28 | Discharge: 2016-08-28 | Disposition: A | Discharge status: Home / Self Care | Payer: Medicare Other | Attending: Emergency Medicine | Admitting: Emergency Medicine

## 2016-08-28 DIAGNOSIS — F17210 Nicotine dependence, cigarettes, uncomplicated: Secondary | ICD-10-CM | POA: Diagnosis not present

## 2016-08-28 DIAGNOSIS — Y92009 Unspecified place in unspecified non-institutional (private) residence as the place of occurrence of the external cause: Secondary | ICD-10-CM | POA: Diagnosis not present

## 2016-08-28 DIAGNOSIS — I10 Essential (primary) hypertension: Secondary | ICD-10-CM | POA: Diagnosis not present

## 2016-08-28 DIAGNOSIS — F419 Anxiety disorder, unspecified: Secondary | ICD-10-CM | POA: Diagnosis not present

## 2016-08-28 DIAGNOSIS — S0081XA Abrasion of other part of head, initial encounter: Secondary | ICD-10-CM | POA: Diagnosis not present

## 2016-08-28 DIAGNOSIS — Z452 Encounter for adjustment and management of vascular access device: Secondary | ICD-10-CM | POA: Diagnosis not present

## 2016-08-28 DIAGNOSIS — T8453XA Infection and inflammatory reaction due to internal right knee prosthesis, initial encounter: Secondary | ICD-10-CM | POA: Diagnosis not present

## 2016-08-28 DIAGNOSIS — Y9389 Activity, other specified: Secondary | ICD-10-CM | POA: Diagnosis not present

## 2016-08-28 DIAGNOSIS — R51 Headache: Secondary | ICD-10-CM | POA: Diagnosis not present

## 2016-08-28 DIAGNOSIS — S0003XA Contusion of scalp, initial encounter: Secondary | ICD-10-CM | POA: Diagnosis not present

## 2016-08-28 DIAGNOSIS — W1800XA Striking against unspecified object with subsequent fall, initial encounter: Secondary | ICD-10-CM | POA: Insufficient documentation

## 2016-08-28 DIAGNOSIS — J45909 Unspecified asthma, uncomplicated: Secondary | ICD-10-CM | POA: Insufficient documentation

## 2016-08-28 DIAGNOSIS — J449 Chronic obstructive pulmonary disease, unspecified: Secondary | ICD-10-CM | POA: Diagnosis not present

## 2016-08-28 DIAGNOSIS — T148XXA Other injury of unspecified body region, initial encounter: Secondary | ICD-10-CM

## 2016-08-28 DIAGNOSIS — Y999 Unspecified external cause status: Secondary | ICD-10-CM | POA: Insufficient documentation

## 2016-08-28 DIAGNOSIS — S0990XA Unspecified injury of head, initial encounter: Secondary | ICD-10-CM | POA: Diagnosis not present

## 2016-08-28 DIAGNOSIS — E039 Hypothyroidism, unspecified: Secondary | ICD-10-CM | POA: Diagnosis not present

## 2016-08-28 DIAGNOSIS — W19XXXA Unspecified fall, initial encounter: Secondary | ICD-10-CM

## 2016-08-28 MED ORDER — ACETAMINOPHEN 325 MG PO TABS
ORAL_TABLET | ORAL | Status: AC
  Administered 2016-08-28: 650 mg via ORAL
  Filled 2016-08-28: qty 2

## 2016-08-28 MED ORDER — LIDOCAINE HCL (PF) 1 % IJ SOLN
INTRAMUSCULAR | Status: AC
  Filled 2016-08-28: qty 5

## 2016-08-28 MED ORDER — LIDOCAINE HCL (PF) 1 % IJ SOLN: 5.0000 mL | Freq: Once | INTRAMUSCULAR | Status: DC

## 2016-08-28 MED ORDER — LIDOCAINE-EPINEPHRINE-TETRACAINE (LET) SOLUTION
3.0000 mL | Freq: Once | NASAL | Status: AC
  Administered 2016-08-28: 3 mL via TOPICAL
  Filled 2016-08-28: qty 3

## 2016-08-28 MED ORDER — ACETAMINOPHEN 325 MG PO TABS
650.0000 mg | ORAL_TABLET | Freq: Once | ORAL | Status: AC
  Administered 2016-08-28: 650 mg via ORAL

## 2016-08-28 NOTE — ED Triage Notes (Signed)
Pt arrived from home after becoming dizzy and falling while going down a ramp. Pt currently on Plavix and reports hitting head. Pt reports headache at this time but denies blurred vision. Pt recently experienced a stroke last week with no reported deficits. Pt has blood in hair but bleeding controlled at this time. Pt alert and oriented x 4. Pupils intact. MD at bedside.

## 2016-08-28 NOTE — ED Notes (Signed)
Patient transported to CT 

## 2016-08-28 NOTE — ED Notes (Signed)
Pt taken to car in wheelchair and able to stand without difficulty. Pt reports continued head pain but no dizziness at this time.

## 2016-08-28 NOTE — ED Provider Notes (Addendum)
[facility] Emergency Department Provider Note  ____________________________________________   I have reviewed the triage vital signs and the nursing notes.   HISTORY  Chief Complaint Fall    HPI [name] Douglas is a 76 y.o. female who states that she was going into a doorway, lost her balance and fell. Patient normally uses a walker but was not doing so this time. Was not a syncopal fall and she did not pass out. She remembers falling. She is certain it happened because she lost her balance. She does have a history of falls in the past.She is on Plavix. She has a slight headache after the fall but mostly it is tenderness to her occiput where she hit the ground. There was some mild bleeding before which is the only reason she came in. She denies any other injury she certainly had no chest pain shortness of breath nausea vomiting or any other antecedent or subsequent discomfort or symptoms.     Past Medical History:  Diagnosis Date  . Anxiety   . Arthritis    "shoulders" (08/20/2014)  . Asthma   . Chronic lower back pain   . Complication of anesthesia    "agitated & restless after knee replacement in 2008"  . COPD (chronic obstructive pulmonary disease) ([place])   . DDD (degenerative disc disease), lumbar   . Depression   . Diverticulosis   . GERD (gastroesophageal reflux disease)   . High cholesterol   . Hypertension   . Hypothyroidism   . On home oxygen therapy    "2L at night" (08/20/2014)  . Shortness of breath dyspnea    with exertion    Patient Active Problem List   Diagnosis Date Noted  . L brain TIA (transient ischemic attack) 08/24/2016  . Solitary pulmonary nodule 08/24/2016  . Aphasia   . Cellulitis of right knee 07/17/2016  . Primary localized osteoarthritis of right knee 07/03/2016  . Ileus ([place]) 04/24/2016  . Generalized abdominal pain   . Colitis due to Clostridium difficile   . Recurrent Clostridium difficile diarrhea   . Enteritis  due to Clostridium difficile 08/22/2014  . Diarrhea 08/20/2014  . HTN (hypertension) 08/20/2014  . HLD (hyperlipidemia) 08/20/2014  . Hypothyroidism 08/20/2014  . Tobacco abuse 08/20/2014  . COPD (chronic obstructive pulmonary disease) ([place]) 08/20/2014  . Back pain 08/20/2014    Past Surgical History:  Procedure Laterality Date  . CARDIAC CATHETERIZATION  ~ 2007  . EXCISIONAL HEMORRHOIDECTOMY  1960's  . EYE SURGERY Bilateral    Cataract extraction with IOL  . JOINT REPLACEMENT Left 2009  . KNEE ARTHROSCOPY Left X 2 <2008  . KNEE ARTHROSCOPY WITH SUBCHONDROPLASTY Right 04/19/2016   Procedure: KNEE ARTHROSCOPY WITH SUBCHONDROPLASTY, PARTIAL MENISCECTOMY;  Surgeon: Hessie Knows, MD;  Location: [facility] ORS;  Service: Orthopedics;  Laterality: Right;  . SHOULDER OPEN ROTATOR CUFF REPAIR Right 2001  . TONSILLECTOMY  ~ 1950  . TOTAL KNEE ARTHROPLASTY Left 2008  . TOTAL KNEE ARTHROPLASTY Right 07/03/2016   Procedure: TOTAL KNEE ARTHROPLASTY;  Surgeon: Hessie Knows, MD;  Location: [facility] ORS;  Service: Orthopedics;  Laterality: Right;  . TUBAL LIGATION  1973  . VAGINAL HYSTERECTOMY  1974    Prior to Admission medications   Medication Sig Start Date End Date Taking? Authorizing Provider  albuterol (PROVENTIL HFA;VENTOLIN HFA) 108 (90 BASE) MCG/ACT inhaler Inhale 2 puffs into the lungs every 6 (six) hours as needed for wheezing or shortness of breath. 08/28/14   Ripudeep Krystal Eaton, MD  albuterol (  PROVENTIL) (2.5 MG/3ML) 0.083% nebulizer solution Take 3 mLs (2.5 mg total) by nebulization every 6 (six) hours as needed for wheezing or shortness of breath. 08/24/16   Donzetta Starch, NP  atorvastatin (LIPITOR) 10 MG tablet Take 10 mg by mouth every evening.    Historical Provider, MD  calcium carbonate (TUMS - DOSED IN MG ELEMENTAL CALCIUM) 500 MG chewable tablet Chew 2 tablets by mouth daily.    Historical Provider, MD  clopidogrel (PLAVIX) 75 MG tablet Take 1 tablet (75 mg total) by mouth daily. 08/25/16    Donzetta Starch, NP  cyclobenzaprine (FLEXERIL) 5 MG tablet Take 5 mg by mouth 2 (two) times daily as needed for muscle spasms.    Historical Provider, MD  diphenhydramine-acetaminophen (TYLENOL PM) 25-500 MG TABS tablet Take 2 tablets by mouth 2 (two) times daily as needed (pain).    Historical Provider, MD  docusate sodium (COLACE) 100 MG capsule Take 100 mg by mouth at bedtime.    Historical Provider, MD  gabapentin (NEURONTIN) 100 MG capsule Take 100 mg by mouth 3 (three) times daily.    Historical Provider, MD  HYDROcodone-acetaminophen (NORCO) 10-325 MG tablet Take 1-2 tablets by mouth every 4 (four) hours as needed for moderate pain. 07/05/16   Reche Dixon, PA-C  levothyroxine (SYNTHROID, LEVOTHROID) 88 MCG tablet Take 88 mcg by mouth daily before breakfast.    Historical Provider, MD  metoprolol succinate (TOPROL-XL) 50 MG 24 hr tablet Take 50 mg by mouth daily. Take with or immediately following a meal.    Historical Provider, MD  oxyCODONE-acetaminophen (PERCOCET) 7.5-325 MG tablet Take 2 tablets by mouth every 4 (four) hours as needed for moderate pain. 07/20/16   Lattie Corns, PA-C  traMADol (ULTRAM) 50 MG tablet Take 2 tablets (100 mg total) by mouth every 6 (six) hours. Patient taking differently: Take 50 mg by mouth 3 (three) times daily as needed (for pain).  07/05/16   Reche Dixon, PA-C  venlafaxine XR (EFFEXOR-XR) 75 MG 24 hr capsule Take 3 capsules (225 mg total) by mouth daily with breakfast. 04/25/16   Max Sane, MD    Allergies Heparin  Family History  Problem Relation Age of Onset  . Diabetes Mother   . Hypertension Mother   . Lung cancer Mother   . Congestive Heart Failure Father   . Congestive Heart Failure Sister     Social History Social History  Substance Use Topics  . Smoking status: Current Every Day Smoker    Packs/day: 0.50    Years: 53.00    Types: Cigarettes  . Smokeless tobacco: Never Used  . Alcohol use No    Review of Systems Constitutional:  No fever/chills Eyes: No visual changes. ENT: No sore throat. No stiff neck no neck pain Cardiovascular: Denies chest pain. Respiratory: Denies shortness of breath. Gastrointestinal:   no vomiting.  No diarrhea.  No constipation. Genitourinary: Negative for dysuria. Musculoskeletal: Negative lower extremity swelling Skin: Negative for rash. Neurological: Negative for severe headaches, focal weakness or numbness. 10-point ROS otherwise negative.  ____________________________________________   PHYSICAL EXAM:  VITAL SIGNS: ED Triage Vitals [08/28/16 1832]  Enc Vitals Group     BP (!) 150/67     Pulse Rate 85     Resp 16     Temp 97.7 F (36.5 C)     Temp Source Oral     SpO2 100 %     Weight      Height      Head  Circumference      Peak Flow      Pain Score      Pain Loc      Pain Edu?      Excl. in [place]?     Constitutional: Alert and oriented. Well appearing and in no acute distress. Eyes: Conjunctivae are normal. PERRL. EOMI. Head: Small laceration noted to the occiput. Awaiting controlled Nose: No congestion/rhinnorhea. Mouth/Throat: Mucous membranes are moist.  Oropharynx non-erythematous. Neck: No stridor.   Nontender with no meningismus Cardiovascular: Normal rate, regular rhythm. Grossly normal heart sounds.  Good peripheral circulation. Respiratory: Normal respiratory effort.  No retractions. Lungs CTAB. Abdominal: Soft and nontender. No distention. No guarding no rebound Back:  There is no focal tenderness or step off.  there is no midline tenderness there are no lesions noted. there is no CVA tenderness  Musculoskeletal: No lower extremity tenderness, no upper extremity tenderness. No joint effusions, no DVT signs strong distal pulses no edema Neurologic:  Normal speech and language. No gross focal neurologic deficits are appreciated.  Skin:  Skin is warm, dry and intact. No rash noted. Psychiatric: Mood and affect are normal. Speech and behavior are  normal.  ____________________________________________   LABS (all labs ordered are listed, but only abnormal results are displayed)  Labs Reviewed - No data to display ____________________________________________  EKG  I personally interpreted any EKGs ordered by me or triage  ____________________________________________  RADIOLOGY  I reviewed any imaging ordered by me or triage that were performed during my shift and, if possible, patient and/or family made aware of any abnormal findings. ____________________________________________   PROCEDURES  Procedure(s) performed: None  Procedures  Critical Care performed: None  ____________________________________________   INITIAL IMPRESSION / ASSESSMENT AND PLAN / ED COURSE  Pertinent labs & imaging results that were available during my care of the patient were reviewed by me and considered in my medical decision making (see chart for details).  Patient with non-syncopal mechanical fall, she declines further workup at this time neurologically intact we'll obtain CT scan as a precaution given her age I will repair her laceration. She believes her tetanus is unstable we'll reverify that with her and we will probably be able to get her home after that. Quite well-appearing.3  ----------------------------------------- 8:47 PM on 08/28/2016 -----------------------------------------  Patient remains very well-appearing, neurologically intact no other complaints family at bedside they feel she is at her baseline and they agree with no further workup, we will discharge. I did investigate the area of concern into her occiput. We irrigated it copiously, I sterilized with Betadine. There is a slight scratch but there is no gaping wound and there is nothing that requires suture. No evidence of closed head injury otherwise, no evidence of skull fracture either clinically or on CT. Patient at her baseline and eager to go home.  Patient  states her tetanus shot is up-to-date  Clinical Course    ____________________________________________   FINAL CLINICAL IMPRESSION(S) / ED DIAGNOSES  Final diagnoses:  Closed head injury      This chart was dictated using voice recognition software.  Despite best efforts to proofread,  errors can occur which can change meaning.      Schuyler Amor, MD 08/28/16 [place], MD 08/28/16 [id number]    Schuyler Amor, MD 08/28/16 [id number]

## 2016-08-30 DIAGNOSIS — F419 Anxiety disorder, unspecified: Secondary | ICD-10-CM | POA: Diagnosis not present

## 2016-08-30 DIAGNOSIS — J45909 Unspecified asthma, uncomplicated: Secondary | ICD-10-CM | POA: Diagnosis not present

## 2016-08-30 DIAGNOSIS — I10 Essential (primary) hypertension: Secondary | ICD-10-CM | POA: Diagnosis not present

## 2016-08-30 DIAGNOSIS — J449 Chronic obstructive pulmonary disease, unspecified: Secondary | ICD-10-CM | POA: Diagnosis not present

## 2016-08-30 DIAGNOSIS — Z452 Encounter for adjustment and management of vascular access device: Secondary | ICD-10-CM | POA: Diagnosis not present

## 2016-08-30 DIAGNOSIS — T8453XA Infection and inflammatory reaction due to internal right knee prosthesis, initial encounter: Secondary | ICD-10-CM | POA: Diagnosis not present

## 2016-08-31 DIAGNOSIS — I10 Essential (primary) hypertension: Secondary | ICD-10-CM | POA: Diagnosis not present

## 2016-08-31 DIAGNOSIS — Z452 Encounter for adjustment and management of vascular access device: Secondary | ICD-10-CM | POA: Diagnosis not present

## 2016-08-31 DIAGNOSIS — J449 Chronic obstructive pulmonary disease, unspecified: Secondary | ICD-10-CM | POA: Diagnosis not present

## 2016-08-31 DIAGNOSIS — J45909 Unspecified asthma, uncomplicated: Secondary | ICD-10-CM | POA: Diagnosis not present

## 2016-08-31 DIAGNOSIS — F419 Anxiety disorder, unspecified: Secondary | ICD-10-CM | POA: Diagnosis not present

## 2016-08-31 DIAGNOSIS — T8453XA Infection and inflammatory reaction due to internal right knee prosthesis, initial encounter: Secondary | ICD-10-CM | POA: Diagnosis not present

## 2016-09-03 DIAGNOSIS — Z452 Encounter for adjustment and management of vascular access device: Secondary | ICD-10-CM | POA: Diagnosis not present

## 2016-09-03 DIAGNOSIS — J449 Chronic obstructive pulmonary disease, unspecified: Secondary | ICD-10-CM | POA: Diagnosis not present

## 2016-09-03 DIAGNOSIS — I10 Essential (primary) hypertension: Secondary | ICD-10-CM | POA: Diagnosis not present

## 2016-09-03 DIAGNOSIS — F419 Anxiety disorder, unspecified: Secondary | ICD-10-CM | POA: Diagnosis not present

## 2016-09-03 DIAGNOSIS — J45909 Unspecified asthma, uncomplicated: Secondary | ICD-10-CM | POA: Diagnosis not present

## 2016-09-03 DIAGNOSIS — T8453XA Infection and inflammatory reaction due to internal right knee prosthesis, initial encounter: Secondary | ICD-10-CM | POA: Diagnosis not present

## 2016-09-04 ENCOUNTER — Other Ambulatory Visit: Payer: Self-pay | Admitting: Cardiology

## 2016-09-04 ENCOUNTER — Ambulatory Visit (INDEPENDENT_AMBULATORY_CARE_PROVIDER_SITE_OTHER): Payer: Medicare Other

## 2016-09-04 DIAGNOSIS — I4891 Unspecified atrial fibrillation: Secondary | ICD-10-CM

## 2016-09-04 DIAGNOSIS — I639 Cerebral infarction, unspecified: Secondary | ICD-10-CM

## 2016-09-04 DIAGNOSIS — G459 Transient cerebral ischemic attack, unspecified: Secondary | ICD-10-CM

## 2016-09-06 DIAGNOSIS — F419 Anxiety disorder, unspecified: Secondary | ICD-10-CM | POA: Diagnosis not present

## 2016-09-06 DIAGNOSIS — J45909 Unspecified asthma, uncomplicated: Secondary | ICD-10-CM | POA: Diagnosis not present

## 2016-09-06 DIAGNOSIS — T8453XA Infection and inflammatory reaction due to internal right knee prosthesis, initial encounter: Secondary | ICD-10-CM | POA: Diagnosis not present

## 2016-09-06 DIAGNOSIS — Z452 Encounter for adjustment and management of vascular access device: Secondary | ICD-10-CM | POA: Diagnosis not present

## 2016-09-06 DIAGNOSIS — I10 Essential (primary) hypertension: Secondary | ICD-10-CM | POA: Diagnosis not present

## 2016-09-06 DIAGNOSIS — J449 Chronic obstructive pulmonary disease, unspecified: Secondary | ICD-10-CM | POA: Diagnosis not present

## 2016-09-07 DIAGNOSIS — T8453XA Infection and inflammatory reaction due to internal right knee prosthesis, initial encounter: Secondary | ICD-10-CM | POA: Diagnosis not present

## 2016-09-07 DIAGNOSIS — I10 Essential (primary) hypertension: Secondary | ICD-10-CM | POA: Diagnosis not present

## 2016-09-07 DIAGNOSIS — Z452 Encounter for adjustment and management of vascular access device: Secondary | ICD-10-CM | POA: Diagnosis not present

## 2016-09-07 DIAGNOSIS — J449 Chronic obstructive pulmonary disease, unspecified: Secondary | ICD-10-CM | POA: Diagnosis not present

## 2016-09-07 DIAGNOSIS — J45909 Unspecified asthma, uncomplicated: Secondary | ICD-10-CM | POA: Diagnosis not present

## 2016-09-07 DIAGNOSIS — F419 Anxiety disorder, unspecified: Secondary | ICD-10-CM | POA: Diagnosis not present

## 2016-09-10 DIAGNOSIS — F419 Anxiety disorder, unspecified: Secondary | ICD-10-CM | POA: Diagnosis not present

## 2016-09-10 DIAGNOSIS — I10 Essential (primary) hypertension: Secondary | ICD-10-CM | POA: Diagnosis not present

## 2016-09-10 DIAGNOSIS — J449 Chronic obstructive pulmonary disease, unspecified: Secondary | ICD-10-CM | POA: Diagnosis not present

## 2016-09-10 DIAGNOSIS — T8453XA Infection and inflammatory reaction due to internal right knee prosthesis, initial encounter: Secondary | ICD-10-CM | POA: Diagnosis not present

## 2016-09-10 DIAGNOSIS — Z452 Encounter for adjustment and management of vascular access device: Secondary | ICD-10-CM | POA: Diagnosis not present

## 2016-09-10 DIAGNOSIS — J45909 Unspecified asthma, uncomplicated: Secondary | ICD-10-CM | POA: Diagnosis not present

## 2016-09-11 DIAGNOSIS — J449 Chronic obstructive pulmonary disease, unspecified: Secondary | ICD-10-CM | POA: Diagnosis not present

## 2016-09-11 DIAGNOSIS — F419 Anxiety disorder, unspecified: Secondary | ICD-10-CM | POA: Diagnosis not present

## 2016-09-11 DIAGNOSIS — T8453XA Infection and inflammatory reaction due to internal right knee prosthesis, initial encounter: Secondary | ICD-10-CM | POA: Diagnosis not present

## 2016-09-11 DIAGNOSIS — Z452 Encounter for adjustment and management of vascular access device: Secondary | ICD-10-CM | POA: Diagnosis not present

## 2016-09-11 DIAGNOSIS — J45909 Unspecified asthma, uncomplicated: Secondary | ICD-10-CM | POA: Diagnosis not present

## 2016-09-11 DIAGNOSIS — I10 Essential (primary) hypertension: Secondary | ICD-10-CM | POA: Diagnosis not present

## 2016-09-13 DIAGNOSIS — T8453XA Infection and inflammatory reaction due to internal right knee prosthesis, initial encounter: Secondary | ICD-10-CM | POA: Diagnosis not present

## 2016-09-13 DIAGNOSIS — I10 Essential (primary) hypertension: Secondary | ICD-10-CM | POA: Diagnosis not present

## 2016-09-13 DIAGNOSIS — J449 Chronic obstructive pulmonary disease, unspecified: Secondary | ICD-10-CM | POA: Diagnosis not present

## 2016-09-13 DIAGNOSIS — F419 Anxiety disorder, unspecified: Secondary | ICD-10-CM | POA: Diagnosis not present

## 2016-09-13 DIAGNOSIS — Z452 Encounter for adjustment and management of vascular access device: Secondary | ICD-10-CM | POA: Diagnosis not present

## 2016-09-13 DIAGNOSIS — J45909 Unspecified asthma, uncomplicated: Secondary | ICD-10-CM | POA: Diagnosis not present

## 2016-09-18 DIAGNOSIS — Z0001 Encounter for general adult medical examination with abnormal findings: Secondary | ICD-10-CM | POA: Diagnosis not present

## 2016-09-18 DIAGNOSIS — E782 Mixed hyperlipidemia: Secondary | ICD-10-CM | POA: Diagnosis not present

## 2016-09-18 DIAGNOSIS — R55 Syncope and collapse: Secondary | ICD-10-CM | POA: Diagnosis not present

## 2016-09-18 DIAGNOSIS — I10 Essential (primary) hypertension: Secondary | ICD-10-CM | POA: Diagnosis not present

## 2016-09-18 DIAGNOSIS — R911 Solitary pulmonary nodule: Secondary | ICD-10-CM | POA: Diagnosis not present

## 2016-09-26 DIAGNOSIS — F419 Anxiety disorder, unspecified: Secondary | ICD-10-CM | POA: Diagnosis not present

## 2016-09-26 DIAGNOSIS — Z452 Encounter for adjustment and management of vascular access device: Secondary | ICD-10-CM | POA: Diagnosis not present

## 2016-09-26 DIAGNOSIS — J449 Chronic obstructive pulmonary disease, unspecified: Secondary | ICD-10-CM | POA: Diagnosis not present

## 2016-09-26 DIAGNOSIS — J45909 Unspecified asthma, uncomplicated: Secondary | ICD-10-CM | POA: Diagnosis not present

## 2016-09-26 DIAGNOSIS — I10 Essential (primary) hypertension: Secondary | ICD-10-CM | POA: Diagnosis not present

## 2016-09-26 DIAGNOSIS — T8453XA Infection and inflammatory reaction due to internal right knee prosthesis, initial encounter: Secondary | ICD-10-CM | POA: Diagnosis not present

## 2016-10-01 DIAGNOSIS — J449 Chronic obstructive pulmonary disease, unspecified: Secondary | ICD-10-CM | POA: Diagnosis not present

## 2016-10-01 DIAGNOSIS — J45909 Unspecified asthma, uncomplicated: Secondary | ICD-10-CM | POA: Diagnosis not present

## 2016-10-01 DIAGNOSIS — I10 Essential (primary) hypertension: Secondary | ICD-10-CM | POA: Diagnosis not present

## 2016-10-01 DIAGNOSIS — T8453XA Infection and inflammatory reaction due to internal right knee prosthesis, initial encounter: Secondary | ICD-10-CM | POA: Diagnosis not present

## 2016-10-01 DIAGNOSIS — F419 Anxiety disorder, unspecified: Secondary | ICD-10-CM | POA: Diagnosis not present

## 2016-10-01 DIAGNOSIS — Z452 Encounter for adjustment and management of vascular access device: Secondary | ICD-10-CM | POA: Diagnosis not present

## 2016-10-11 ENCOUNTER — Other Ambulatory Visit: Payer: Self-pay | Admitting: Internal Medicine

## 2016-10-11 DIAGNOSIS — J449 Chronic obstructive pulmonary disease, unspecified: Secondary | ICD-10-CM | POA: Diagnosis not present

## 2016-10-11 DIAGNOSIS — R911 Solitary pulmonary nodule: Secondary | ICD-10-CM

## 2016-10-11 DIAGNOSIS — F17210 Nicotine dependence, cigarettes, uncomplicated: Secondary | ICD-10-CM | POA: Diagnosis not present

## 2016-10-15 ENCOUNTER — Telehealth: Payer: Self-pay

## 2016-10-15 NOTE — Telephone Encounter (Signed)
-----   Message from Rosalin Hawking, MD sent at 10/13/2016  6:58 AM EST ----- Could you please let the patient know that the heart monitoring test done recently was negative for irregular heart rhythm. Please continue current treatment. Thanks.  Rosalin Hawking, MD PhD Stroke Neurology 10/13/2016 6:58 AM

## 2016-10-15 NOTE — Telephone Encounter (Signed)
Tried calling home number the vm is not set up.

## 2016-10-15 NOTE — Telephone Encounter (Signed)
Rn stated to [name] her mom had a hospital follow up and cancel the appt. [name] stated to call her mom to schedule the hospital follow up, and her mom will give her the time and location.

## 2016-10-15 NOTE — Telephone Encounter (Signed)
RN spoke with patients daughter [name] about her moms cardiac monitor. Rn stated the heart monitor was negative for any irregular heart beat. Continue treatment plan. Pts daughter verbalized understanding.

## 2016-10-16 ENCOUNTER — Ambulatory Visit: Payer: Self-pay | Admitting: Neurology

## 2016-10-16 NOTE — Telephone Encounter (Signed)
Tried calling patient a second time. Could not leave vm its not set up. Rn trying to schedule hospital stroke up. Rn was told on 10/15/2016 by pts daughter to call her to schedule appt.

## 2016-10-17 ENCOUNTER — Ambulatory Visit
Admission: RE | Admit: 2016-10-17 | Discharge: 2016-10-17 | Disposition: A | Discharge status: Home / Self Care | Payer: Medicare Other | Source: Ambulatory Visit | Attending: Internal Medicine | Admitting: Internal Medicine

## 2016-10-17 DIAGNOSIS — R918 Other nonspecific abnormal finding of lung field: Secondary | ICD-10-CM | POA: Diagnosis not present

## 2016-10-17 DIAGNOSIS — I700 Atherosclerosis of aorta: Secondary | ICD-10-CM | POA: Insufficient documentation

## 2016-10-17 DIAGNOSIS — R911 Solitary pulmonary nodule: Secondary | ICD-10-CM | POA: Diagnosis not present

## 2016-10-17 DIAGNOSIS — I2510 Atherosclerotic heart disease of native coronary artery without angina pectoris: Secondary | ICD-10-CM | POA: Insufficient documentation

## 2016-10-17 DIAGNOSIS — R55 Syncope and collapse: Secondary | ICD-10-CM | POA: Diagnosis not present

## 2016-10-17 LAB — POCT I-STAT CREATININE: Creatinine, Ser: 0.8 mg/dL (ref 0.44–1.00)

## 2016-10-17 MED ORDER — IOPAMIDOL (ISOVUE-300) INJECTION 61%
75.0000 mL | Freq: Once | INTRAVENOUS | Status: AC | PRN
  Administered 2016-10-17: 75 mL via INTRAVENOUS

## 2016-10-17 NOTE — Telephone Encounter (Signed)
Rn call patient to schedule hospital follow up for stroke. Pt was seen by Dr. Erlinda Hong in hospital but schedule with Dr. Leonie Man in outpatient. PT will call her daughter of appt time of 12/12/2016 and to check in at 0330pm.

## 2016-10-24 DIAGNOSIS — R0602 Shortness of breath: Secondary | ICD-10-CM | POA: Diagnosis not present

## 2016-10-25 DIAGNOSIS — R911 Solitary pulmonary nodule: Secondary | ICD-10-CM | POA: Diagnosis not present

## 2016-10-25 DIAGNOSIS — F17210 Nicotine dependence, cigarettes, uncomplicated: Secondary | ICD-10-CM | POA: Diagnosis not present

## 2016-10-25 DIAGNOSIS — J449 Chronic obstructive pulmonary disease, unspecified: Secondary | ICD-10-CM | POA: Diagnosis not present

## 2016-10-25 DIAGNOSIS — F411 Generalized anxiety disorder: Secondary | ICD-10-CM | POA: Diagnosis not present

## 2016-10-31 DIAGNOSIS — J449 Chronic obstructive pulmonary disease, unspecified: Secondary | ICD-10-CM | POA: Diagnosis not present

## 2016-10-31 DIAGNOSIS — F411 Generalized anxiety disorder: Secondary | ICD-10-CM | POA: Diagnosis not present

## 2016-10-31 DIAGNOSIS — F17210 Nicotine dependence, cigarettes, uncomplicated: Secondary | ICD-10-CM | POA: Diagnosis not present

## 2016-10-31 DIAGNOSIS — R911 Solitary pulmonary nodule: Secondary | ICD-10-CM | POA: Diagnosis not present

## 2016-11-08 DIAGNOSIS — E876 Hypokalemia: Secondary | ICD-10-CM | POA: Diagnosis not present

## 2016-11-08 DIAGNOSIS — E039 Hypothyroidism, unspecified: Secondary | ICD-10-CM | POA: Diagnosis not present

## 2016-11-08 DIAGNOSIS — J449 Chronic obstructive pulmonary disease, unspecified: Secondary | ICD-10-CM | POA: Diagnosis not present

## 2016-11-08 DIAGNOSIS — E782 Mixed hyperlipidemia: Secondary | ICD-10-CM | POA: Diagnosis not present

## 2016-11-08 DIAGNOSIS — I10 Essential (primary) hypertension: Secondary | ICD-10-CM | POA: Diagnosis not present

## 2016-11-26 DIAGNOSIS — R000 Tachycardia, unspecified: Secondary | ICD-10-CM | POA: Diagnosis not present

## 2016-11-26 DIAGNOSIS — I10 Essential (primary) hypertension: Secondary | ICD-10-CM | POA: Diagnosis not present

## 2016-11-26 DIAGNOSIS — E039 Hypothyroidism, unspecified: Secondary | ICD-10-CM | POA: Diagnosis not present

## 2016-11-26 DIAGNOSIS — J449 Chronic obstructive pulmonary disease, unspecified: Secondary | ICD-10-CM | POA: Diagnosis not present

## 2016-11-26 DIAGNOSIS — E782 Mixed hyperlipidemia: Secondary | ICD-10-CM | POA: Diagnosis not present

## 2016-11-26 DIAGNOSIS — I6523 Occlusion and stenosis of bilateral carotid arteries: Secondary | ICD-10-CM | POA: Diagnosis not present

## 2016-11-26 DIAGNOSIS — D509 Iron deficiency anemia, unspecified: Secondary | ICD-10-CM | POA: Diagnosis not present

## 2016-11-26 DIAGNOSIS — F17210 Nicotine dependence, cigarettes, uncomplicated: Secondary | ICD-10-CM | POA: Diagnosis not present

## 2016-12-05 ENCOUNTER — Other Ambulatory Visit: Payer: Self-pay

## 2016-12-05 NOTE — Patient Outreach (Signed)
Telephone outreach to patient to obtain mRS was successfully completed. mRS = 0 

## 2016-12-07 ENCOUNTER — Encounter: Payer: Self-pay | Admitting: Emergency Medicine

## 2016-12-07 ENCOUNTER — Emergency Department
Admission: EM | Admit: 2016-12-07 | Discharge: 2016-12-08 | Payer: Medicare Other | Attending: Emergency Medicine | Admitting: Emergency Medicine

## 2016-12-07 DIAGNOSIS — I609 Nontraumatic subarachnoid hemorrhage, unspecified: Secondary | ICD-10-CM

## 2016-12-07 DIAGNOSIS — J45909 Unspecified asthma, uncomplicated: Secondary | ICD-10-CM | POA: Diagnosis not present

## 2016-12-07 DIAGNOSIS — Y929 Unspecified place or not applicable: Secondary | ICD-10-CM | POA: Insufficient documentation

## 2016-12-07 DIAGNOSIS — Y9389 Activity, other specified: Secondary | ICD-10-CM | POA: Diagnosis not present

## 2016-12-07 DIAGNOSIS — I10 Essential (primary) hypertension: Secondary | ICD-10-CM | POA: Insufficient documentation

## 2016-12-07 DIAGNOSIS — S3993XA Unspecified injury of pelvis, initial encounter: Secondary | ICD-10-CM | POA: Diagnosis not present

## 2016-12-07 DIAGNOSIS — S0083XA Contusion of other part of head, initial encounter: Secondary | ICD-10-CM | POA: Diagnosis not present

## 2016-12-07 DIAGNOSIS — S066X0A Traumatic subarachnoid hemorrhage without loss of consciousness, initial encounter: Secondary | ICD-10-CM | POA: Insufficient documentation

## 2016-12-07 DIAGNOSIS — Z79899 Other long term (current) drug therapy: Secondary | ICD-10-CM | POA: Diagnosis not present

## 2016-12-07 DIAGNOSIS — W1809XA Striking against other object with subsequent fall, initial encounter: Secondary | ICD-10-CM | POA: Diagnosis not present

## 2016-12-07 DIAGNOSIS — S0990XA Unspecified injury of head, initial encounter: Secondary | ICD-10-CM | POA: Diagnosis not present

## 2016-12-07 DIAGNOSIS — E039 Hypothyroidism, unspecified: Secondary | ICD-10-CM | POA: Diagnosis not present

## 2016-12-07 DIAGNOSIS — W19XXXA Unspecified fall, initial encounter: Secondary | ICD-10-CM | POA: Diagnosis not present

## 2016-12-07 DIAGNOSIS — F17210 Nicotine dependence, cigarettes, uncomplicated: Secondary | ICD-10-CM | POA: Diagnosis not present

## 2016-12-07 DIAGNOSIS — S199XXA Unspecified injury of neck, initial encounter: Secondary | ICD-10-CM | POA: Diagnosis not present

## 2016-12-07 DIAGNOSIS — J449 Chronic obstructive pulmonary disease, unspecified: Secondary | ICD-10-CM | POA: Diagnosis not present

## 2016-12-07 DIAGNOSIS — M25551 Pain in right hip: Secondary | ICD-10-CM | POA: Diagnosis not present

## 2016-12-07 DIAGNOSIS — Y999 Unspecified external cause status: Secondary | ICD-10-CM | POA: Insufficient documentation

## 2016-12-07 NOTE — ED Provider Notes (Signed)
[facility] Emergency Department Provider Note   First MD Initiated Contact with Patient 12/07/16 2348     (approximate)  I have reviewed the triage vital signs and the nursing notes.   HISTORY  Chief Complaint Fall    HPI [name] Douglas is a 76 y.o. female with below list of chronic medical conditions presents to emergency department via EMS after accidental fall. Patient states that she was going outside to smoke a cigarette and accidentally missed the last step on a staircase resulting in her fall. Patient states that she struck the right side of her forehead denies any loss of consciousness. Patient does admit to 7 out of 10 right forehead pain at this time.Patient denies any weakness numbness gait instability nausea or vomiting. Patient denies any visual changes   Past Medical History:  Diagnosis Date  . Anxiety   . Arthritis    "shoulders" (08/20/2014)  . Asthma   . Chronic lower back pain   . Complication of anesthesia    "agitated & restless after knee replacement in 2008"  . COPD (chronic obstructive pulmonary disease) ([place])   . DDD (degenerative disc disease), lumbar   . Depression   . Diverticulosis   . GERD (gastroesophageal reflux disease)   . High cholesterol   . Hypertension   . Hypothyroidism   . On home oxygen therapy    "2L at night" (08/20/2014)  . Shortness of breath dyspnea    with exertion    Patient Active Problem List   Diagnosis Date Noted  . L brain TIA (transient ischemic attack) 08/24/2016  . Solitary pulmonary nodule 08/24/2016  . Aphasia   . Cellulitis of right knee 07/17/2016  . Primary localized osteoarthritis of right knee 07/03/2016  . Ileus ([place]) 04/24/2016  . Generalized abdominal pain   . Colitis due to Clostridium difficile   . Recurrent Clostridium difficile diarrhea   . Enteritis due to Clostridium difficile 08/22/2014  . Diarrhea 08/20/2014  . HTN (hypertension) 08/20/2014  . HLD (hyperlipidemia)  08/20/2014  . Hypothyroidism 08/20/2014  . Tobacco abuse 08/20/2014  . COPD (chronic obstructive pulmonary disease) ([place]) 08/20/2014  . Back pain 08/20/2014    Past Surgical History:  Procedure Laterality Date  . CARDIAC CATHETERIZATION  ~ 2007  . EXCISIONAL HEMORRHOIDECTOMY  1960's  . EYE SURGERY Bilateral    Cataract extraction with IOL  . JOINT REPLACEMENT Left 2009  . KNEE ARTHROSCOPY Left X 2 <2008  . KNEE ARTHROSCOPY WITH SUBCHONDROPLASTY Right 04/19/2016   Procedure: KNEE ARTHROSCOPY WITH SUBCHONDROPLASTY, PARTIAL MENISCECTOMY;  Surgeon: Hessie Knows, MD;  Location: [facility] ORS;  Service: Orthopedics;  Laterality: Right;  . SHOULDER OPEN ROTATOR CUFF REPAIR Right 2001  . TONSILLECTOMY  ~ 1950  . TOTAL KNEE ARTHROPLASTY Left 2008  . TOTAL KNEE ARTHROPLASTY Right 07/03/2016   Procedure: TOTAL KNEE ARTHROPLASTY;  Surgeon: Hessie Knows, MD;  Location: [facility] ORS;  Service: Orthopedics;  Laterality: Right;  . TUBAL LIGATION  1973  . VAGINAL HYSTERECTOMY  1974    Prior to Admission medications   Medication Sig Start Date End Date Taking? Authorizing Provider  albuterol (PROVENTIL HFA;VENTOLIN HFA) 108 (90 BASE) MCG/ACT inhaler Inhale 2 puffs into the lungs every 6 (six) hours as needed for wheezing or shortness of breath. 08/28/14   Ripudeep Krystal Eaton, MD  albuterol (PROVENTIL) (2.5 MG/3ML) 0.083% nebulizer solution Take 3 mLs (2.5 mg total) by nebulization every 6 (six) hours as needed for wheezing or shortness of breath. 08/24/16  Donzetta Starch, NP  atorvastatin (LIPITOR) 10 MG tablet Take 10 mg by mouth every evening.    Historical Provider, MD  calcium carbonate (TUMS - DOSED IN MG ELEMENTAL CALCIUM) 500 MG chewable tablet Chew 2 tablets by mouth daily.    Historical Provider, MD  clopidogrel (PLAVIX) 75 MG tablet Take 1 tablet (75 mg total) by mouth daily. 08/25/16   Donzetta Starch, NP  cyclobenzaprine (FLEXERIL) 5 MG tablet Take 5 mg by mouth 2 (two) times daily as needed for muscle spasms.     Historical Provider, MD  diphenhydramine-acetaminophen (TYLENOL PM) 25-500 MG TABS tablet Take 2 tablets by mouth 2 (two) times daily as needed (pain).    Historical Provider, MD  docusate sodium (COLACE) 100 MG capsule Take 100 mg by mouth at bedtime.    Historical Provider, MD  gabapentin (NEURONTIN) 100 MG capsule Take 100 mg by mouth 3 (three) times daily.    Historical Provider, MD  HYDROcodone-acetaminophen (NORCO) 10-325 MG tablet Take 1-2 tablets by mouth every 4 (four) hours as needed for moderate pain. 07/05/16   Reche Dixon, PA-C  levothyroxine (SYNTHROID, LEVOTHROID) 88 MCG tablet Take 88 mcg by mouth daily before breakfast.    Historical Provider, MD  metoprolol succinate (TOPROL-XL) 50 MG 24 hr tablet Take 50 mg by mouth daily. Take with or immediately following a meal.    Historical Provider, MD  oxyCODONE-acetaminophen (PERCOCET) 7.5-325 MG tablet Take 2 tablets by mouth every 4 (four) hours as needed for moderate pain. 07/20/16   Lattie Corns, PA-C  traMADol (ULTRAM) 50 MG tablet Take 2 tablets (100 mg total) by mouth every 6 (six) hours. Patient taking differently: Take 50 mg by mouth 3 (three) times daily as needed (for pain).  07/05/16   Reche Dixon, PA-C  venlafaxine XR (EFFEXOR-XR) 75 MG 24 hr capsule Take 3 capsules (225 mg total) by mouth daily with breakfast. 04/25/16   Max Sane, MD    Allergies Heparin  Family History  Problem Relation Age of Onset  . Diabetes Mother   . Hypertension Mother   . Lung cancer Mother   . Congestive Heart Failure Father   . Congestive Heart Failure Sister     Social History Social History  Substance Use Topics  . Smoking status: Current Every Day Smoker    Packs/day: 0.50    Years: 53.00    Types: Cigarettes  . Smokeless tobacco: Never Used  . Alcohol use No    Review of Systems Constitutional: No fever/chills Eyes: No visual changes. ENT: No sore throat.positive for right forehead injury Cardiovascular: Denies chest  pain. Respiratory: Denies shortness of breath. Gastrointestinal: No abdominal pain.  No nausea, no vomiting.  No diarrhea.  No constipation. Genitourinary: Negative for dysuria. Musculoskeletal: Negative for back pain. Skin: Negative for rash. Neurological: Negative for headaches, focal weakness or numbness.  10-point ROS otherwise negative.  ____________________________________________   PHYSICAL EXAM:  VITAL SIGNS: ED Triage Vitals  Enc Vitals Group     BP 12/07/16 2350 107/87     Pulse Rate 12/07/16 2350 85     Resp 12/07/16 2350 18     Temp 12/07/16 2350 98 F (36.7 C)     Temp Source 12/07/16 2350 Oral     SpO2 12/07/16 2350 99 %     Weight 12/07/16 2351 209 lb 6.4 oz (95 kg)     Height --      Head Circumference --      Peak Flow --  Pain Score --      Pain Loc --      Pain Edu? --      Excl. in [place]? --     Constitutional: Alert and oriented. Well appearing and in no acute distress. Eyes: Conjunctivae are normal. PERRL. EOMI. Head: Large right frontal contusion swelling hematoma Ears:  Healthy appearing ear canals and TMs bilaterally Nose: No congestion/rhinnorhea.no deformity noted Mouth/Throat: Mucous membranes are moist.  Oropharynx non-erythematous. Neck: No stridor.   Cardiovascular: Normal rate, regular rhythm. Good peripheral circulation. Grossly normal heart sounds. Respiratory: Normal respiratory effort.  No retractions. Lungs CTAB. Gastrointestinal: Soft and nontender. No distention.  Musculoskeletal: No lower extremity tenderness nor edema. No gross deformities of extremities. Neurologic:  Normal speech and language. No gross focal neurologic deficits are appreciated.  Skin:  Skin is warm, dry and intact. No rash noted. Psychiatric: Mood and affect are normal. Speech and behavior are normal.  ____________________________________________   LABS (all labs ordered are listed, but only abnormal results are displayed)  Labs Reviewed  CBC -  Abnormal; Notable for the following:       Result Value   Hemoglobin 9.7 (*)    HCT 29.2 (*)    MCV 75.3 (*)    MCH 25.0 (*)    RDW 17.6 (*)    All other components within normal limits  COMPREHENSIVE METABOLIC PANEL - Abnormal; Notable for the following:    Glucose, Bld 113 (*)    ALT 9 (*)    All other components within normal limits  PROTIME-INR     RADIOLOGY I, [place] Dajah[place] Vondra, personally viewed and evaluated these images (plain radiographs) as part of my medical decision making, as well as reviewing the written report by the radiologist.  Ct Head Wo Contrast  Result Date: 12/08/2016 CLINICAL DATA:  Fall with right forehead hematoma EXAM: CT HEAD WITHOUT CONTRAST CT CERVICAL SPINE WITHOUT CONTRAST TECHNIQUE: Multidetector CT imaging of the head and cervical spine was performed following the standard protocol without intravenous contrast. Multiplanar CT image reconstructions of the cervical spine were also generated. COMPARISON:  08/28/2016, 08/23/2016, 08/22/2016 FINDINGS: CT HEAD FINDINGS Brain: No large vessel territorial infarct or mass is seen. Scattered areas of subarachnoid hemorrhage within the bilateral right greater than left frontal lobes no midline shift. The ventricles are nonenlarged. Mild white matter small vessel ischemic changes. Vascular: No hyperdense vessels. Stable calcifications at the right MCA bifurcation. Carotid artery calcification. Skull: No definite fracture.  No suspicious bone lesion. Sinuses/Orbits: Mild mucosal thickening in the ethmoid sinuses. No acute orbital abnormality. Other: Large right frontal scalp hematoma and laceration. CT CERVICAL SPINE FINDINGS Alignment: Minimal 2 mm anterolisthesis of C3 on C4. Straightening of the cervical spine. Facet alignment is within normal limits. Skull base and vertebrae: Craniovertebral junction appears intact. The vertebral body heights are normal. No fracture is visualized. Soft tissues and spinal canal: No  prevertebral fluid or swelling. No visible canal hematoma. Disc levels: Moderate to severe degenerative disc changes at C4-C5, C5-C6 and C6-C7. Multilevel bilateral facet arthropathy. Multilevel bilateral foraminal stenosis, new most marked between C4 and C7. Upper chest: Lung apices demonstrate emphysema. No evidence for a thyroid mass. Carotid artery calcifications. Other: None IMPRESSION: 1. Scattered areas of subarachnoid hemorrhage within the bilateral frontal lobes, right greater than left. No midline shift. Normal ventricle size. Large right frontal scalp hematoma without underlying fracture. 2. Mild periventricular white matter small vessel ischemic change 3. Minimal anterolisthesis of C3 on C4 suspected to be due  to degenerative change. No acute fracture or malalignment is seen. 4. Mild emphysema at the lung apices Critical Value/emergent results were called by telephone at the time of interpretation on 12/08/2016 at 12:47 am to Dr. Marjean Donna , who verbally acknowledged these results. Electronically Signed   By: Donavan Foil M.D.   On: 12/08/2016 00:47   Ct Cervical Spine Wo Contrast  Result Date: 12/08/2016 CLINICAL DATA:  Fall with right forehead hematoma EXAM: CT HEAD WITHOUT CONTRAST CT CERVICAL SPINE WITHOUT CONTRAST TECHNIQUE: Multidetector CT imaging of the head and cervical spine was performed following the standard protocol without intravenous contrast. Multiplanar CT image reconstructions of the cervical spine were also generated. COMPARISON:  08/28/2016, 08/23/2016, 08/22/2016 FINDINGS: CT HEAD FINDINGS Brain: No large vessel territorial infarct or mass is seen. Scattered areas of subarachnoid hemorrhage within the bilateral right greater than left frontal lobes no midline shift. The ventricles are nonenlarged. Mild white matter small vessel ischemic changes. Vascular: No hyperdense vessels. Stable calcifications at the right MCA bifurcation. Carotid artery calcification. Skull: No  definite fracture.  No suspicious bone lesion. Sinuses/Orbits: Mild mucosal thickening in the ethmoid sinuses. No acute orbital abnormality. Other: Large right frontal scalp hematoma and laceration. CT CERVICAL SPINE FINDINGS Alignment: Minimal 2 mm anterolisthesis of C3 on C4. Straightening of the cervical spine. Facet alignment is within normal limits. Skull base and vertebrae: Craniovertebral junction appears intact. The vertebral body heights are normal. No fracture is visualized. Soft tissues and spinal canal: No prevertebral fluid or swelling. No visible canal hematoma. Disc levels: Moderate to severe degenerative disc changes at C4-C5, C5-C6 and C6-C7. Multilevel bilateral facet arthropathy. Multilevel bilateral foraminal stenosis, new most marked between C4 and C7. Upper chest: Lung apices demonstrate emphysema. No evidence for a thyroid mass. Carotid artery calcifications. Other: None IMPRESSION: 1. Scattered areas of subarachnoid hemorrhage within the bilateral frontal lobes, right greater than left. No midline shift. Normal ventricle size. Large right frontal scalp hematoma without underlying fracture. 2. Mild periventricular white matter small vessel ischemic change 3. Minimal anterolisthesis of C3 on C4 suspected to be due to degenerative change. No acute fracture or malalignment is seen. 4. Mild emphysema at the lung apices Critical Value/emergent results were called by telephone at the time of interpretation on 12/08/2016 at 12:47 am to Dr. Marjean Donna , who verbally acknowledged these results. Electronically Signed   By: Donavan Foil M.D.   On: 12/08/2016 00:47   Dg Pelvis Portable  Result Date: 12/08/2016 CLINICAL DATA:  Right hip pain after a fall EXAM: PORTABLE PELVIS 1-2 VIEWS COMPARISON:  04/23/2016 FINDINGS: SI joints are symmetric.  Calcified pelvic phleboliths. Pubic symphysis appears intact. No acute displaced fracture or dislocation. The joint spaces are grossly maintained.  IMPRESSION: No definite acute osseous abnormality. Electronically Signed   By: Donavan Foil M.D.   On: 12/08/2016 03:18    ____________________________________________   PROCEDURES  Critical Care performed: CRITICAL CARE Performed by: Gregor Hams   Total critical care time: 45 minutes  Critical care time was exclusive of separately billable procedures and treating other patients.  Critical care was necessary to treat or prevent imminent or life-threatening deterioration.  Critical care was time spent personally by me on the following activities: development of treatment plan with patient and/or surrogate as well as nursing, discussions with consultants, evaluation of patient's response to treatment, examination of patient, obtaining history from patient or surrogate, ordering and performing treatments and interventions, ordering and review of laboratory studies, ordering and review  of radiographic studies, pulse oximetry and re-evaluation of patient's condition.   Procedures   ____________________________________________   INITIAL IMPRESSION / ASSESSMENT AND PLAN / ED COURSE  Pertinent labs & imaging results that were available during my care of the patient were reviewed by me and considered in my medical decision making (see chart for details).  76 year old female presenting with history of accidental fall with large right frontal scalp hematoma. Given concern for intracranial injury CT scan of the head performed which revealed bilateral frontal subarachnoid hemorrhage as such patient discussed with Dr. March Rummage neurosurgeon on call at [facility]. Patient's family preferred [facility]. She was accepted by Dr. March Rummage. Patient with no neurological deficits while in the emergency department.      ____________________________________________  FINAL CLINICAL IMPRESSION(S) / ED DIAGNOSES  Final diagnoses:  Subarachnoid hemorrhage ([place])     MEDICATIONS GIVEN DURING THIS  VISIT:  Medications  morphine 2 MG/ML injection 2 mg (2 mg Intravenous Given 12/08/16 0056)  ondansetron (ZOFRAN) injection 4 mg (4 mg Intravenous Given 12/08/16 0056)     NEW OUTPATIENT MEDICATIONS STARTED DURING THIS VISIT:  Discharge Medication List as of 12/08/2016  3:19 AM      Discharge Medication List as of 12/08/2016  3:19 AM      Discharge Medication List as of 12/08/2016  3:19 AM       Note:  This document was prepared using Dragon voice recognition software and may include unintentional dictation errors.    Gregor Hams, MD 12/08/16 [phone]

## 2016-12-07 NOTE — ED Triage Notes (Signed)
Patient was at home tonight [facility]) and went out for her night smoke and missed the bottom step and hit her head on concrete. Pt denies LOC but is on Plavix and hematoma is on right side forehead and app. 3" in diameter and with app. 2" height.  Bleeding is controlled and per MD order ice pack is applied directly to the wound. Patient is AOx4 and carrying on normal conversation and laughing during triage.  Pt also has contusions to right hand and knee but has no pain complaint and no bleeding from these sites.  Pt is c/o headache pain 3/10.  She has a hx of stroke, hypertension, thyroid, and COPD.  Patient in NAD at this time.  MD is at bedside during triage.

## 2016-12-08 ENCOUNTER — Emergency Department: Payer: Medicare Other

## 2016-12-08 DIAGNOSIS — Z888 Allergy status to other drugs, medicaments and biological substances status: Secondary | ICD-10-CM | POA: Diagnosis not present

## 2016-12-08 DIAGNOSIS — Z7901 Long term (current) use of anticoagulants: Secondary | ICD-10-CM | POA: Diagnosis not present

## 2016-12-08 DIAGNOSIS — M4312 Spondylolisthesis, cervical region: Secondary | ICD-10-CM | POA: Diagnosis not present

## 2016-12-08 DIAGNOSIS — W108XXA Fall (on) (from) other stairs and steps, initial encounter: Secondary | ICD-10-CM | POA: Diagnosis not present

## 2016-12-08 DIAGNOSIS — F329 Major depressive disorder, single episode, unspecified: Secondary | ICD-10-CM | POA: Diagnosis not present

## 2016-12-08 DIAGNOSIS — S066X0A Traumatic subarachnoid hemorrhage without loss of consciousness, initial encounter: Secondary | ICD-10-CM | POA: Diagnosis not present

## 2016-12-08 DIAGNOSIS — S0990XA Unspecified injury of head, initial encounter: Secondary | ICD-10-CM | POA: Diagnosis not present

## 2016-12-08 DIAGNOSIS — M25551 Pain in right hip: Secondary | ICD-10-CM | POA: Diagnosis not present

## 2016-12-08 DIAGNOSIS — J449 Chronic obstructive pulmonary disease, unspecified: Secondary | ICD-10-CM | POA: Diagnosis not present

## 2016-12-08 DIAGNOSIS — S3993XA Unspecified injury of pelvis, initial encounter: Secondary | ICD-10-CM | POA: Diagnosis not present

## 2016-12-08 DIAGNOSIS — Z96651 Presence of right artificial knee joint: Secondary | ICD-10-CM | POA: Diagnosis not present

## 2016-12-08 DIAGNOSIS — M50322 Other cervical disc degeneration at C5-C6 level: Secondary | ICD-10-CM | POA: Diagnosis not present

## 2016-12-08 DIAGNOSIS — S199XXA Unspecified injury of neck, initial encounter: Secondary | ICD-10-CM | POA: Diagnosis not present

## 2016-12-08 DIAGNOSIS — E785 Hyperlipidemia, unspecified: Secondary | ICD-10-CM | POA: Diagnosis not present

## 2016-12-08 DIAGNOSIS — S0083XA Contusion of other part of head, initial encounter: Secondary | ICD-10-CM | POA: Diagnosis not present

## 2016-12-08 DIAGNOSIS — I10 Essential (primary) hypertension: Secondary | ICD-10-CM | POA: Diagnosis not present

## 2016-12-08 DIAGNOSIS — Z7902 Long term (current) use of antithrombotics/antiplatelets: Secondary | ICD-10-CM | POA: Diagnosis not present

## 2016-12-08 DIAGNOSIS — I161 Hypertensive emergency: Secondary | ICD-10-CM | POA: Diagnosis not present

## 2016-12-08 DIAGNOSIS — W109XXA Fall (on) (from) unspecified stairs and steps, initial encounter: Secondary | ICD-10-CM | POA: Diagnosis not present

## 2016-12-08 DIAGNOSIS — F17210 Nicotine dependence, cigarettes, uncomplicated: Secondary | ICD-10-CM | POA: Diagnosis not present

## 2016-12-08 DIAGNOSIS — I499 Cardiac arrhythmia, unspecified: Secondary | ICD-10-CM | POA: Diagnosis not present

## 2016-12-08 DIAGNOSIS — Z79899 Other long term (current) drug therapy: Secondary | ICD-10-CM | POA: Diagnosis not present

## 2016-12-08 DIAGNOSIS — Z8673 Personal history of transient ischemic attack (TIA), and cerebral infarction without residual deficits: Secondary | ICD-10-CM | POA: Diagnosis not present

## 2016-12-08 DIAGNOSIS — S79921A Unspecified injury of right thigh, initial encounter: Secondary | ICD-10-CM | POA: Diagnosis not present

## 2016-12-08 LAB — COMPREHENSIVE METABOLIC PANEL
ALT: 9 U/L — ABNORMAL LOW (ref 14–54)
AST: 19 U/L (ref 15–41)
Albumin: 3.6 g/dL (ref 3.5–5.0)
Alkaline Phosphatase: 80 U/L (ref 38–126)
Anion gap: 6 (ref 5–15)
BUN: 14 mg/dL (ref 6–20)
CO2: 27 mmol/L (ref 22–32)
Calcium: 8.9 mg/dL (ref 8.9–10.3)
Chloride: 104 mmol/L (ref 101–111)
Creatinine, Ser: 0.84 mg/dL (ref 0.44–1.00)
GFR calc Af Amer: >60 mL/min (ref >60)
GFR calc non Af Amer: >60 mL/min (ref >60)
Glucose, Bld: 113 mg/dL — ABNORMAL HIGH (ref 65–99)
Potassium: 4.1 mmol/L (ref 3.5–5.1)
Sodium: 137 mmol/L (ref 135–145)
Total Bilirubin: 0.3 mg/dL (ref 0.3–1.2)
Total Protein: 6.9 g/dL (ref 6.5–8.1)

## 2016-12-08 LAB — CBC
HCT: 29.2 % — ABNORMAL LOW (ref 35.0–47.0)
Hemoglobin: 9.7 g/dL — ABNORMAL LOW (ref 12.0–16.0)
MCH: 25 pg — ABNORMAL LOW (ref 26.0–34.0)
MCHC: 33.2 g/dL (ref 32.0–36.0)
MCV: 75.3 fL — ABNORMAL LOW (ref 80.0–100.0)
Platelets: 309 10*3/uL (ref 150–440)
RBC: 3.88 MIL/uL (ref 3.80–5.20)
RDW: 17.6 % — ABNORMAL HIGH (ref 11.5–14.5)
WBC: 10.3 10*3/uL (ref 3.6–11.0)

## 2016-12-08 LAB — PROTIME-INR
INR: 0.94
Prothrombin Time: 12.6 seconds (ref 11.4–15.2)

## 2016-12-08 MED ORDER — ONDANSETRON HCL 4 MG/2ML IJ SOLN
4.0000 mg | Freq: Once | INTRAMUSCULAR | Status: AC
  Administered 2016-12-08: 4 mg via INTRAVENOUS

## 2016-12-08 MED ORDER — ONDANSETRON HCL 4 MG/2ML IJ SOLN
INTRAMUSCULAR | Status: AC
  Filled 2016-12-08: qty 2

## 2016-12-08 MED ORDER — OXYCODONE-ACETAMINOPHEN 5-325 MG PO TABS: 1.0000 | ORAL_TABLET | Freq: Once | ORAL | Status: DC

## 2016-12-08 MED ORDER — MORPHINE SULFATE (PF) 2 MG/ML IV SOLN
2.0000 mg | Freq: Once | INTRAVENOUS | Status: AC
  Administered 2016-12-08: 2 mg via INTRAVENOUS

## 2016-12-08 MED ORDER — MORPHINE SULFATE (PF) 2 MG/ML IV SOLN
INTRAVENOUS | Status: AC
  Filled 2016-12-08: qty 1

## 2016-12-09 DIAGNOSIS — S066X0A Traumatic subarachnoid hemorrhage without loss of consciousness, initial encounter: Secondary | ICD-10-CM | POA: Diagnosis not present

## 2016-12-09 DIAGNOSIS — S0083XA Contusion of other part of head, initial encounter: Secondary | ICD-10-CM | POA: Diagnosis not present

## 2016-12-09 DIAGNOSIS — I10 Essential (primary) hypertension: Secondary | ICD-10-CM | POA: Diagnosis not present

## 2016-12-09 DIAGNOSIS — J449 Chronic obstructive pulmonary disease, unspecified: Secondary | ICD-10-CM | POA: Diagnosis not present

## 2016-12-09 DIAGNOSIS — I609 Nontraumatic subarachnoid hemorrhage, unspecified: Secondary | ICD-10-CM | POA: Insufficient documentation

## 2016-12-09 DIAGNOSIS — E785 Hyperlipidemia, unspecified: Secondary | ICD-10-CM | POA: Diagnosis not present

## 2016-12-09 DIAGNOSIS — M25551 Pain in right hip: Secondary | ICD-10-CM | POA: Diagnosis not present

## 2016-12-12 ENCOUNTER — Ambulatory Visit: Payer: Self-pay | Admitting: Neurology

## 2017-02-21 DIAGNOSIS — E039 Hypothyroidism, unspecified: Secondary | ICD-10-CM | POA: Diagnosis not present

## 2017-02-21 DIAGNOSIS — D509 Iron deficiency anemia, unspecified: Secondary | ICD-10-CM | POA: Diagnosis not present

## 2017-03-04 DIAGNOSIS — F17210 Nicotine dependence, cigarettes, uncomplicated: Secondary | ICD-10-CM | POA: Diagnosis not present

## 2017-03-04 DIAGNOSIS — E039 Hypothyroidism, unspecified: Secondary | ICD-10-CM | POA: Diagnosis not present

## 2017-03-04 DIAGNOSIS — E782 Mixed hyperlipidemia: Secondary | ICD-10-CM | POA: Diagnosis not present

## 2017-03-04 DIAGNOSIS — D509 Iron deficiency anemia, unspecified: Secondary | ICD-10-CM | POA: Diagnosis not present

## 2017-03-04 DIAGNOSIS — I10 Essential (primary) hypertension: Secondary | ICD-10-CM | POA: Diagnosis not present

## 2017-03-12 ENCOUNTER — Other Ambulatory Visit: Payer: Self-pay

## 2017-07-08 DIAGNOSIS — D509 Iron deficiency anemia, unspecified: Secondary | ICD-10-CM | POA: Diagnosis not present

## 2017-07-08 DIAGNOSIS — Z23 Encounter for immunization: Secondary | ICD-10-CM | POA: Diagnosis not present

## 2017-07-08 DIAGNOSIS — E039 Hypothyroidism, unspecified: Secondary | ICD-10-CM | POA: Diagnosis not present

## 2017-08-02 ENCOUNTER — Encounter: Payer: Self-pay | Admitting: Nurse Practitioner

## 2017-08-02 ENCOUNTER — Ambulatory Visit (INDEPENDENT_AMBULATORY_CARE_PROVIDER_SITE_OTHER): Payer: Medicare Other | Admitting: Nurse Practitioner

## 2017-08-02 VITALS — BP 130/88 | HR 86 | Resp 16 | Ht 66.0 in | Wt 220.0 lb

## 2017-08-02 DIAGNOSIS — E038 Other specified hypothyroidism: Secondary | ICD-10-CM | POA: Diagnosis not present

## 2017-08-02 DIAGNOSIS — R112 Nausea with vomiting, unspecified: Secondary | ICD-10-CM | POA: Diagnosis not present

## 2017-08-02 DIAGNOSIS — M6281 Muscle weakness (generalized): Secondary | ICD-10-CM

## 2017-08-02 DIAGNOSIS — G441 Vascular headache, not elsewhere classified: Secondary | ICD-10-CM | POA: Diagnosis not present

## 2017-08-02 DIAGNOSIS — E063 Autoimmune thyroiditis: Secondary | ICD-10-CM

## 2017-08-02 DIAGNOSIS — I10 Essential (primary) hypertension: Secondary | ICD-10-CM | POA: Diagnosis not present

## 2017-08-02 MED ORDER — ONDANSETRON HCL 4 MG PO TABS: 4.0000 mg | ORAL_TABLET | Freq: Three times a day (TID) | ORAL | 0 refills | Status: DC | PRN

## 2017-08-02 NOTE — Progress Notes (Signed)
Subjective:     Patient ID: [name] Douglas, female   DOB: [birth year], 76 y.o.   MRN: [id number]  The patient is c/o nausea with single episode of vomiting today. States that nausea has diminished some since she vomited. Has not pain or fever and no other associated symptoms.  Has been "wobbly" over past few weeks. Feels unsteady on her feet. Will get sharp stabbing headache in right upper part of the head which lasts for just a few minutes then go away. No vision changes or other issue. Lab work showed significant hypothyroid. Levothyroxine was increased from 112 to 173mcg approximately 2 weeks ago. She has started the new dose.      Review of Systems  Constitutional: Positive for fatigue and unexpected weight change.       States that she has gained about 20 pounds over past 2 to 3 months.   HENT: Negative.   Respiratory: Negative for cough, shortness of breath and wheezing.   Cardiovascular: Negative for chest pain, palpitations and leg swelling.  Gastrointestinal: Positive for nausea and vomiting.  Endocrine:       Levothyroxine recently increased from 112 to 166mcg daily.   Genitourinary: Negative.   Musculoskeletal: Negative.   Allergic/Immunologic: Negative.   Neurological: Positive for weakness, light-headedness and headaches.  Hematological: Negative.   Psychiatric/Behavioral: Negative.        Objective:   Physical Exam  Constitutional: She is oriented to person, place, and time. She appears well-developed.  HENT:  Head: Normocephalic and atraumatic.  Eyes: Pupils are equal, round, and reactive to light.  Neck: Normal range of motion. Neck supple. No thyromegaly present.  Cardiovascular: Normal rate and regular rhythm.  Pulmonary/Chest: Effort normal and breath sounds normal.  Abdominal: Soft. Bowel sounds are normal. There is no tenderness.  Musculoskeletal: Normal range of motion.  Neurological: She is alert and oriented to person, place, and time. No cranial nerve  deficit. She exhibits normal muscle tone. Coordination normal.  Skin: Skin is warm and dry.  Psychiatric: She has a normal mood and affect. Her behavior is normal.       Assessment:     Nausea and vomiting, intractability of vomiting not specified, unspecified vomiting type - Plan: ondansetron (ZOFRAN) 4 MG tablet  Other vascular headache - Plan: CT Head Wo Contrast  Muscle weakness (generalized) - Plan: CT Head Wo Contrast  Hypothyroidism due to Hashimoto's thyroiditis - Plan: TSH, T4, Thyroid Peroxidase Antibody  Essential hypertension     Plan:     1. Nausea/vomiting - likely viral GI infection. Add zofran 4mg  tid as needed. Recommend bland diet and advance as tolerated. 2. Vascular headache - neuro exam negative. new onset - will get CT of head w/o contrast for further evaluation. 3. Muscle weakness - possibly due to severe hypothyroid. meds recently adjusted. Recheck in 6 weeks. CT head for further evaluation.  4. Hypothyroid - levthyroxine recently increased to 161mcg daily. Recheck thryoid panel with auto-antibodies and adjust as indicated.  5. Hypertension - stable. Continue bp medication as prescribed.

## 2017-08-27 ENCOUNTER — Other Ambulatory Visit: Payer: Self-pay | Admitting: Internal Medicine

## 2017-09-03 DIAGNOSIS — Z961 Presence of intraocular lens: Secondary | ICD-10-CM | POA: Diagnosis not present

## 2017-09-06 ENCOUNTER — Ambulatory Visit: Admission: RE | Admit: 2017-09-06 | Payer: Medicare Other | Source: Ambulatory Visit

## 2017-09-25 ENCOUNTER — Ambulatory Visit
Admission: RE | Admit: 2017-09-25 | Discharge: 2017-09-25 | Disposition: A | Discharge status: Home / Self Care | Payer: Medicare Other | Source: Ambulatory Visit | Attending: Nurse Practitioner | Admitting: Nurse Practitioner

## 2017-09-25 DIAGNOSIS — R51 Headache: Secondary | ICD-10-CM | POA: Diagnosis not present

## 2017-09-25 DIAGNOSIS — M6281 Muscle weakness (generalized): Secondary | ICD-10-CM

## 2017-09-25 DIAGNOSIS — G441 Vascular headache, not elsewhere classified: Secondary | ICD-10-CM | POA: Diagnosis not present

## 2017-09-25 DIAGNOSIS — S0990XA Unspecified injury of head, initial encounter: Secondary | ICD-10-CM | POA: Diagnosis not present

## 2017-11-04 ENCOUNTER — Ambulatory Visit (INDEPENDENT_AMBULATORY_CARE_PROVIDER_SITE_OTHER): Payer: Medicare Other | Admitting: Nurse Practitioner

## 2017-11-04 ENCOUNTER — Encounter: Payer: Self-pay | Admitting: Nurse Practitioner

## 2017-11-04 VITALS — BP 195/83 | HR 108 | Ht 66.0 in | Wt 218.0 lb

## 2017-11-04 DIAGNOSIS — M1711 Unilateral primary osteoarthritis, right knee: Secondary | ICD-10-CM

## 2017-11-04 DIAGNOSIS — E039 Hypothyroidism, unspecified: Secondary | ICD-10-CM

## 2017-11-04 DIAGNOSIS — I10 Essential (primary) hypertension: Secondary | ICD-10-CM

## 2017-11-04 DIAGNOSIS — I6523 Occlusion and stenosis of bilateral carotid arteries: Secondary | ICD-10-CM

## 2017-11-04 MED ORDER — TRAMADOL HCL 50 MG PO TABS: 50.0000 mg | ORAL_TABLET | Freq: Three times a day (TID) | ORAL | 2 refills | Status: DC | PRN

## 2017-11-04 MED ORDER — METOPROLOL SUCCINATE ER 50 MG PO TB24: 50.0000 mg | ORAL_TABLET | Freq: Every day | ORAL | 5 refills | Status: DC

## 2017-11-04 NOTE — Progress Notes (Signed)
[facility] [place], [place] [address]  Internal MEDICINE  Office Visit Note  Patient Name: [name] Douglas  [id number]  [id number]  Date of Service: 11/24/2017  Chief Complaint  Patient presents with  . Hypertension    The patient is here for follow up exam. Continues to feel unsteady on her feet, though improved from her last visit. Still getting headaches on the right side of her head. Very sharp and stabbing headaches,  which lasts for just a few minutes then go away. No vision changes or other issue. She did have more significant episode this past Thursday, when headache lasted much longer than usual. She did have head CT since she was last seen. There was no evidence of acute abnormality or infact. Did show calcified plaque in right carotid artery.  Her blood pressure is very elevated today. Has been out of her metoprolol ER 50mg  for a little over a week. Episode of more severe headache happened while off blood pressure medication.     Pt is here for routine follow up.    Current Medication: Outpatient Encounter Medications as of 11/04/2017  Medication Sig Note  . albuterol (PROVENTIL HFA;VENTOLIN HFA) 108 (90 BASE) MCG/ACT inhaler Inhale 2 puffs into the lungs every 6 (six) hours as needed for wheezing or shortness of breath.   Marland Kitchen albuterol (PROVENTIL) (2.5 MG/3ML) 0.083% nebulizer solution Take 3 mLs (2.5 mg total) by nebulization every 6 (six) hours as needed for wheezing or shortness of breath.   Marland Kitchen atorvastatin (LIPITOR) 10 MG tablet TAKE ONE TABLET BY MOUTH EVERY EVENING FOR CHOLESTEROL   . calcium carbonate (OS-CAL) 1250 (500 Ca) MG chewable tablet Chew by mouth.   . calcium carbonate (TUMS - DOSED IN MG ELEMENTAL CALCIUM) 500 MG chewable tablet Chew 2 tablets by mouth daily.   . clopidogrel (PLAVIX) 75 MG tablet Take 1 tablet (75 mg total) by mouth daily.   . cyclobenzaprine (FLEXERIL) 5 MG tablet Take 5 mg by mouth 2 (two) times daily as needed for  muscle spasms.   . diphenhydramine-acetaminophen (TYLENOL PM) 25-500 MG TABS tablet Take 2 tablets by mouth 2 (two) times daily as needed (pain).   Marland Kitchen docusate sodium (COLACE) 100 MG capsule Take 100 mg by mouth at bedtime.   . gabapentin (NEURONTIN) 100 MG capsule Take 100 mg by mouth 3 (three) times daily.   Marland Kitchen levothyroxine (SYNTHROID, LEVOTHROID) 137 MCG tablet Take by mouth daily before breakfast.    . metoprolol succinate (TOPROL-XL) 50 MG 24 hr tablet Take 1 tablet (50 mg total) by mouth daily. Take with or immediately following a meal.   . ondansetron (ZOFRAN) 4 MG tablet Take 1 tablet (4 mg total) by mouth every 8 (eight) hours as needed for nausea or vomiting.   . traMADol (ULTRAM) 50 MG tablet Take 1 tablet (50 mg total) by mouth 3 (three) times daily as needed (for pain).   Marland Kitchen venlafaxine XR (EFFEXOR-XR) 75 MG 24 hr capsule Take 3 capsules (225 mg total) by mouth daily with breakfast.   . [DISCONTINUED] metoprolol succinate (TOPROL-XL) 50 MG 24 hr tablet Take 50 mg by mouth daily. Take with or immediately following a meal.   . [DISCONTINUED] traMADol (ULTRAM) 50 MG tablet Take 2 tablets (100 mg total) by mouth every 6 (six) hours. (Patient taking differently: Take 50 mg by mouth 3 (three) times daily as needed (for pain). ) 08/22/2016: LF (#90 tablets) on 06/27/16, per pharmacy records  . [DISCONTINUED] traMADol Veatrice Bourbon)  50 MG tablet Take 1 tablet (50 mg total) by mouth 3 (three) times daily as needed (for pain).   Marland Kitchen HYDROcodone-acetaminophen (NORCO) 10-325 MG tablet Take 1-2 tablets by mouth every 4 (four) hours as needed for moderate pain. (Patient not taking: Reported on 08/02/2017)   . oxyCODONE-acetaminophen (PERCOCET) 7.5-325 MG tablet Take 2 tablets by mouth every 4 (four) hours as needed for moderate pain. (Patient not taking: Reported on 08/02/2017)    No facility-administered encounter medications on file as of 11/04/2017.     Surgical History: Past Surgical History:  Procedure  Laterality Date  . CARDIAC CATHETERIZATION  ~ 2007  . EXCISIONAL HEMORRHOIDECTOMY  1960's  . EYE SURGERY Bilateral    Cataract extraction with IOL  . JOINT REPLACEMENT Left 2009  . KNEE ARTHROSCOPY Left X 2 <2008  . KNEE ARTHROSCOPY WITH SUBCHONDROPLASTY Right 04/19/2016   Procedure: KNEE ARTHROSCOPY WITH SUBCHONDROPLASTY, PARTIAL MENISCECTOMY;  Surgeon: Hessie Knows, MD;  Location: [facility] ORS;  Service: Orthopedics;  Laterality: Right;  . SHOULDER OPEN ROTATOR CUFF REPAIR Right 2001  . TONSILLECTOMY  ~ 1950  . TOTAL KNEE ARTHROPLASTY Left 2008  . TOTAL KNEE ARTHROPLASTY Right 07/03/2016   Procedure: TOTAL KNEE ARTHROPLASTY;  Surgeon: Hessie Knows, MD;  Location: [facility] ORS;  Service: Orthopedics;  Laterality: Right;  . TUBAL LIGATION  1973  . VAGINAL HYSTERECTOMY  1974    Medical History: Past Medical History:  Diagnosis Date  . Anxiety   . Arthritis    "shoulders" (08/20/2014)  . Asthma   . Chronic lower back pain   . Complication of anesthesia    "agitated & restless after knee replacement in 2008"  . COPD (chronic obstructive pulmonary disease) ([place])   . DDD (degenerative disc disease), lumbar   . Depression   . Diverticulosis   . GERD (gastroesophageal reflux disease)   . High cholesterol   . Hypertension   . Hypothyroidism   . On home oxygen therapy    "2L at night" (08/20/2014)  . Shortness of breath dyspnea    with exertion    Family History: Family History  Problem Relation Age of Onset  . Diabetes Mother   . Hypertension Mother   . Lung cancer Mother   . Congestive Heart Failure Father   . Congestive Heart Failure Sister     Social History   Socioeconomic History  . Marital status: Divorced    Spouse name: Not on file  . Number of children: Not on file  . Years of education: Not on file  . Highest education level: Not on file  Occupational History  . Not on file  Social Needs  . Financial resource strain: Not on file  . Food insecurity:    Worry: Not  on file    Inability: Not on file  . Transportation needs:    Medical: Not on file    Non-medical: Not on file  Tobacco Use  . Smoking status: Current Every Day Smoker    Packs/day: 0.50    Years: 53.00    Pack years: 26.50    Types: Cigarettes  . Smokeless tobacco: Never Used  Substance and Sexual Activity  . Alcohol use: No  . Drug use: No  . Sexual activity: Never  Lifestyle  . Physical activity:    Days per week: Not on file    Minutes per session: Not on file  . Stress: Not on file  Relationships  . Social connections:    Talks on phone: Not on  file    Gets together: Not on file    Attends religious service: Not on file    Active member of club or organization: Not on file    Attends meetings of clubs or organizations: Not on file    Relationship status: Not on file  . Intimate partner violence:    Fear of current or ex partner: Not on file    Emotionally abused: Not on file    Physically abused: Not on file    Forced sexual activity: Not on file  Other Topics Concern  . Not on file  Social History Narrative  . Not on file      Review of Systems  Constitutional: Positive for fatigue and unexpected weight change.       States that she has gained about 20 pounds over past 2 to 3 months.   HENT: Negative for congestion, postnasal drip and sneezing.   Eyes: Negative.   Respiratory: Negative for cough, chest tightness, shortness of breath and wheezing.   Cardiovascular: Negative for chest pain, palpitations and leg swelling.       Blood pressures very elevated today  Gastrointestinal: Negative for nausea and vomiting.  Endocrine:       Needs to have thyroid levels checked again since dose levothyroxine increased .  Genitourinary: Negative.   Musculoskeletal: Positive for arthralgias.       Intermittent low back pain. Sometimes has arthritic pain in her knees.   Allergic/Immunologic: Negative.   Neurological: Positive for weakness, light-headedness and  headaches.  Hematological: Negative for adenopathy.  Psychiatric/Behavioral: Negative for agitation, confusion and sleep disturbance.    Today's Vitals   11/04/17 1359  BP: (!) 195/83  Pulse: (!) 108  SpO2: 96%  Weight: 218 lb 0.3 oz (98.9 kg)  Height: 5\' 6"  (1.676 m)    Physical Exam  Constitutional: She is oriented to person, place, and time. She appears well-developed and well-nourished.  HENT:  Head: Normocephalic and atraumatic.  Eyes: Conjunctivae and EOM are normal. Pupils are equal, round, and reactive to light.  Neck: Normal range of motion. Neck supple. No thyromegaly present.  Cardiovascular: Normal rate, regular rhythm and normal heart sounds.  Mild tachycardia  Pulmonary/Chest: Effort normal and breath sounds normal.  Abdominal: Soft. Bowel sounds are normal. There is no tenderness.  Musculoskeletal: Normal range of motion.  Lymphadenopathy:    She has no cervical adenopathy.  Neurological: She is alert and oriented to person, place, and time. No cranial nerve deficit. She exhibits normal muscle tone. Coordination normal.  Skin: Skin is warm and dry.  Psychiatric: She has a normal mood and affect. Her behavior is normal.  Nursing note and vitals reviewed.  Assessment/Plan: 1. Essential hypertension Restart metoprolol ER 50mg  daily. Continue other bp medications as prescribed. Heart healthy diet and 3gram sodium diet recommended.  - metoprolol succinate (TOPROL-XL) 50 MG 24 hr tablet; Take 1 tablet (50 mg total) by mouth daily. Take with or immediately following a meal.  Dispense: 30 tablet; Refill: 5  2. Primary localized osteoarthritis of right knee - traMADol (ULTRAM) 50 MG tablet; Take 1 tablet (50 mg total) by mouth 3 (three) times daily as needed (for pain).  Dispense: 90 tablet; Refill: 2  3. Occlusion and stenosis of bilateral carotid arteries - US Carotid Duplex Bilateral; Future  4. Acquired hypothyroidism - TSH + free T4 Adjust levothyroxine as  indicated.   General Counseling: dmya long understanding of the findings of todays visit and agrees with plan of  treatment. I have discussed any further diagnostic evaluation that may be needed or ordered today. We also reviewed her medications today. she has been encouraged to call the office with any questions or concerns that should arise related to todays visit.   Hypertension Counseling:   The following hypertensive lifestyle modification were recommended and discussed:  1. Limiting alcohol intake to less than 1 oz/day of ethanol:(24 oz of beer or 8 oz of wine or 2 oz of 100-proof whiskey). 2. Take baby ASA 81 mg daily. 3. Importance of regular aerobic exercise and losing weight. 4. Reduce dietary saturated fat and cholesterol intake for overall cardiovascular health. 5. Maintaining adequate dietary potassium, calcium, and magnesium intake. 6. Regular monitoring of the blood pressure. 7. Reduce sodium intake to less than 100 mmol/day (less than 2.3 gm of sodium or less than 6 gm of sodium choride)   This patient was seen by Leretha Pol, FNP- C in Collaboration with Dr Lavera Guise as a part of collaborative care agreement  Orders Placed This Encounter  Procedures  . US Carotid Duplex Bilateral  . TSH + free T4    Meds ordered this encounter  Medications  . metoprolol succinate (TOPROL-XL) 50 MG 24 hr tablet    Sig: Take 1 tablet (50 mg total) by mouth daily. Take with or immediately following a meal.    Dispense:  30 tablet    Refill:  5    Order Specific Question:   Supervising Provider    Answer:   Lavera Guise [place]  . DISCONTD: traMADol (ULTRAM) 50 MG tablet    Sig: Take 1 tablet (50 mg total) by mouth 3 (three) times daily as needed (for pain).    Dispense:  90 tablet    Refill:  2    Order Specific Question:   Supervising Provider    Answer:   Lavera Guise [[id number]]  . traMADol (ULTRAM) 50 MG tablet    Sig: Take 1 tablet (50 mg total) by mouth 3 (three)  times daily as needed (for pain).    Dispense:  90 tablet    Refill:  2    Order Specific Question:   Supervising Provider    Answer:   Lavera Guise [[id number]]    Time spent: 54 Minutes          Dr Lavera Guise Internal medicine

## 2017-11-24 DIAGNOSIS — I6523 Occlusion and stenosis of bilateral carotid arteries: Secondary | ICD-10-CM | POA: Insufficient documentation

## 2017-12-20 ENCOUNTER — Emergency Department: Payer: Medicare Other

## 2017-12-20 ENCOUNTER — Observation Stay: Payer: Medicare Other

## 2017-12-20 ENCOUNTER — Other Ambulatory Visit: Payer: Self-pay

## 2017-12-20 ENCOUNTER — Observation Stay (HOSPITAL_BASED_OUTPATIENT_CLINIC_OR_DEPARTMENT_OTHER)
Admit: 2017-12-20 | Discharge: 2017-12-20 | Disposition: A | Discharge status: Home / Self Care | Payer: Medicare Other | Attending: Internal Medicine | Admitting: Internal Medicine

## 2017-12-20 ENCOUNTER — Encounter: Payer: Self-pay | Admitting: Emergency Medicine

## 2017-12-20 ENCOUNTER — Observation Stay
Admission: EM | Admit: 2017-12-20 | Discharge: 2017-12-21 | Disposition: A | Discharge status: Home Health | Payer: Medicare Other | Attending: Internal Medicine | Admitting: Internal Medicine

## 2017-12-20 DIAGNOSIS — E039 Hypothyroidism, unspecified: Secondary | ICD-10-CM | POA: Diagnosis not present

## 2017-12-20 DIAGNOSIS — F17210 Nicotine dependence, cigarettes, uncomplicated: Secondary | ICD-10-CM | POA: Diagnosis not present

## 2017-12-20 DIAGNOSIS — E7800 Pure hypercholesterolemia, unspecified: Secondary | ICD-10-CM | POA: Insufficient documentation

## 2017-12-20 DIAGNOSIS — Z79899 Other long term (current) drug therapy: Secondary | ICD-10-CM | POA: Insufficient documentation

## 2017-12-20 DIAGNOSIS — I639 Cerebral infarction, unspecified: Secondary | ICD-10-CM | POA: Diagnosis not present

## 2017-12-20 DIAGNOSIS — K219 Gastro-esophageal reflux disease without esophagitis: Secondary | ICD-10-CM | POA: Diagnosis not present

## 2017-12-20 DIAGNOSIS — I160 Hypertensive urgency: Secondary | ICD-10-CM | POA: Diagnosis not present

## 2017-12-20 DIAGNOSIS — Z96653 Presence of artificial knee joint, bilateral: Secondary | ICD-10-CM | POA: Insufficient documentation

## 2017-12-20 DIAGNOSIS — I351 Nonrheumatic aortic (valve) insufficiency: Secondary | ICD-10-CM | POA: Diagnosis not present

## 2017-12-20 DIAGNOSIS — M6281 Muscle weakness (generalized): Secondary | ICD-10-CM | POA: Diagnosis not present

## 2017-12-20 DIAGNOSIS — F329 Major depressive disorder, single episode, unspecified: Secondary | ICD-10-CM | POA: Insufficient documentation

## 2017-12-20 DIAGNOSIS — F419 Anxiety disorder, unspecified: Secondary | ICD-10-CM | POA: Insufficient documentation

## 2017-12-20 DIAGNOSIS — J449 Chronic obstructive pulmonary disease, unspecified: Secondary | ICD-10-CM | POA: Insufficient documentation

## 2017-12-20 DIAGNOSIS — M19012 Primary osteoarthritis, left shoulder: Secondary | ICD-10-CM | POA: Insufficient documentation

## 2017-12-20 DIAGNOSIS — Z66 Do not resuscitate: Secondary | ICD-10-CM | POA: Diagnosis not present

## 2017-12-20 DIAGNOSIS — Z9981 Dependence on supplemental oxygen: Secondary | ICD-10-CM | POA: Insufficient documentation

## 2017-12-20 DIAGNOSIS — I071 Rheumatic tricuspid insufficiency: Secondary | ICD-10-CM | POA: Diagnosis not present

## 2017-12-20 DIAGNOSIS — R4182 Altered mental status, unspecified: Secondary | ICD-10-CM | POA: Diagnosis not present

## 2017-12-20 DIAGNOSIS — M19011 Primary osteoarthritis, right shoulder: Secondary | ICD-10-CM | POA: Diagnosis not present

## 2017-12-20 DIAGNOSIS — R29818 Other symptoms and signs involving the nervous system: Secondary | ICD-10-CM | POA: Diagnosis not present

## 2017-12-20 DIAGNOSIS — Z8249 Family history of ischemic heart disease and other diseases of the circulatory system: Secondary | ICD-10-CM | POA: Insufficient documentation

## 2017-12-20 DIAGNOSIS — R200 Anesthesia of skin: Secondary | ICD-10-CM | POA: Diagnosis not present

## 2017-12-20 DIAGNOSIS — Z7902 Long term (current) use of antithrombotics/antiplatelets: Secondary | ICD-10-CM | POA: Diagnosis not present

## 2017-12-20 DIAGNOSIS — I739 Peripheral vascular disease, unspecified: Secondary | ICD-10-CM | POA: Diagnosis not present

## 2017-12-20 DIAGNOSIS — I2510 Atherosclerotic heart disease of native coronary artery without angina pectoris: Secondary | ICD-10-CM | POA: Insufficient documentation

## 2017-12-20 DIAGNOSIS — R531 Weakness: Secondary | ICD-10-CM | POA: Diagnosis not present

## 2017-12-20 DIAGNOSIS — Z888 Allergy status to other drugs, medicaments and biological substances status: Secondary | ICD-10-CM | POA: Insufficient documentation

## 2017-12-20 DIAGNOSIS — I6523 Occlusion and stenosis of bilateral carotid arteries: Secondary | ICD-10-CM | POA: Diagnosis not present

## 2017-12-20 DIAGNOSIS — Z8673 Personal history of transient ischemic attack (TIA), and cerebral infarction without residual deficits: Secondary | ICD-10-CM | POA: Diagnosis not present

## 2017-12-20 DIAGNOSIS — R110 Nausea: Secondary | ICD-10-CM | POA: Diagnosis not present

## 2017-12-20 DIAGNOSIS — I10 Essential (primary) hypertension: Secondary | ICD-10-CM | POA: Insufficient documentation

## 2017-12-20 LAB — CBC
HCT: 34.5 % — ABNORMAL LOW (ref 35.0–47.0)
Hemoglobin: 11.5 g/dL — ABNORMAL LOW (ref 12.0–16.0)
MCH: 26.3 pg (ref 26.0–34.0)
MCHC: 33.3 g/dL (ref 32.0–36.0)
MCV: 79.2 fL — ABNORMAL LOW (ref 80.0–100.0)
Platelets: 294 10*3/uL (ref 150–440)
RBC: 4.35 MIL/uL (ref 3.80–5.20)
RDW: 15.6 % — ABNORMAL HIGH (ref 11.5–14.5)
WBC: 8.3 10*3/uL (ref 3.6–11.0)

## 2017-12-20 LAB — URINALYSIS, COMPLETE (UACMP) WITH MICROSCOPIC
Bacteria, UA: NONE SEEN
Bilirubin Urine: NEGATIVE
Glucose, UA: NEGATIVE mg/dL
Hgb urine dipstick: NEGATIVE
Ketones, ur: NEGATIVE mg/dL
Leukocytes, UA: NEGATIVE
Nitrite: NEGATIVE
Protein, ur: NEGATIVE mg/dL
Specific Gravity, Urine: 1.004 — ABNORMAL LOW (ref 1.005–1.030)
pH: 6 (ref 5.0–8.0)

## 2017-12-20 LAB — BASIC METABOLIC PANEL
Anion gap: 10 (ref 5–15)
BUN: 11 mg/dL (ref 6–20)
CO2: 26 mmol/L (ref 22–32)
Calcium: 9.1 mg/dL (ref 8.9–10.3)
Chloride: 103 mmol/L (ref 101–111)
Creatinine, Ser: 0.89 mg/dL (ref 0.44–1.00)
GFR calc Af Amer: >60 mL/min (ref >60)
GFR calc non Af Amer: >60 mL/min (ref >60)
Glucose, Bld: 103 mg/dL — ABNORMAL HIGH (ref 65–99)
Potassium: 3.3 mmol/L — ABNORMAL LOW (ref 3.5–5.1)
Sodium: 139 mmol/L (ref 135–145)

## 2017-12-20 LAB — TROPONIN I: Troponin I: <0.03 ng/mL (ref <0.03)

## 2017-12-20 LAB — HEMOGLOBIN A1C
Hgb A1c MFr Bld: 6.2 % — ABNORMAL HIGH (ref 4.8–5.6)
Mean Plasma Glucose: 131.24 mg/dL

## 2017-12-20 LAB — PROTIME-INR
INR: 0.97
Prothrombin Time: 12.8 seconds (ref 11.4–15.2)

## 2017-12-20 LAB — APTT: aPTT: 41 seconds — ABNORMAL HIGH (ref 24–36)

## 2017-12-20 LAB — ETHANOL: Alcohol, Ethyl (B): <10 mg/dL (ref <10)

## 2017-12-20 MED ORDER — ONDANSETRON HCL 4 MG/2ML IJ SOLN
4.0000 mg | Freq: Once | INTRAMUSCULAR | Status: AC
Start: 2017-12-20 — End: 2017-12-20
  Administered 2017-12-20: 4 mg via INTRAVENOUS
  Filled 2017-12-20: qty 2

## 2017-12-20 MED ORDER — STROKE: EARLY STAGES OF RECOVERY BOOK
Freq: Once | Status: AC
  Administered 2017-12-20: 16:00:00

## 2017-12-20 MED ORDER — ENOXAPARIN SODIUM 40 MG/0.4ML ~~LOC~~ SOLN
40.0000 mg | SUBCUTANEOUS | Status: DC
  Administered 2017-12-20: 40 mg via SUBCUTANEOUS
  Filled 2017-12-20: qty 0.4

## 2017-12-20 MED ORDER — ACETAMINOPHEN 650 MG RE SUPP: 650.0000 mg | RECTAL | Status: DC | PRN

## 2017-12-20 MED ORDER — LEVOTHYROXINE SODIUM 137 MCG PO TABS
137.0000 ug | ORAL_TABLET | Freq: Every day | ORAL | Status: DC
  Administered 2017-12-21: 08:00:00 137 ug via ORAL
  Filled 2017-12-20: qty 1

## 2017-12-20 MED ORDER — ACETAMINOPHEN 160 MG/5ML PO SOLN
650.0000 mg | ORAL | Status: DC | PRN
  Filled 2017-12-20: qty 20.3

## 2017-12-20 MED ORDER — DOCUSATE SODIUM 100 MG PO CAPS
100.0000 mg | ORAL_CAPSULE | Freq: Every day | ORAL | Status: DC
  Administered 2017-12-20: 100 mg via ORAL
  Filled 2017-12-20: qty 1

## 2017-12-20 MED ORDER — ACETAMINOPHEN 325 MG PO TABS
650.0000 mg | ORAL_TABLET | ORAL | Status: DC | PRN
  Administered 2017-12-20 (×2): 650 mg via ORAL
  Filled 2017-12-20 (×3): qty 2

## 2017-12-20 MED ORDER — GADOBENATE DIMEGLUMINE 529 MG/ML IV SOLN
20.0000 mL | Freq: Once | INTRAVENOUS | Status: AC | PRN
  Administered 2017-12-20: 20 mL via INTRAVENOUS

## 2017-12-20 MED ORDER — DIPHENHYDRAMINE HCL 25 MG PO CAPS
25.0000 mg | ORAL_CAPSULE | Freq: Every evening | ORAL | Status: DC | PRN
  Administered 2017-12-20: 25 mg via ORAL
  Filled 2017-12-20: qty 1

## 2017-12-20 MED ORDER — ACETAMINOPHEN 500 MG PO TABS
1000.0000 mg | ORAL_TABLET | Freq: Once | ORAL | Status: AC
  Administered 2017-12-20: 1000 mg via ORAL
  Filled 2017-12-20: qty 2

## 2017-12-20 MED ORDER — CLOPIDOGREL BISULFATE 75 MG PO TABS
75.0000 mg | ORAL_TABLET | Freq: Every day | ORAL | Status: DC
  Administered 2017-12-21: 08:00:00 75 mg via ORAL
  Filled 2017-12-20: qty 1

## 2017-12-20 MED ORDER — ATORVASTATIN CALCIUM 20 MG PO TABS
40.0000 mg | ORAL_TABLET | Freq: Every day | ORAL | Status: DC
  Administered 2017-12-20: 17:00:00 40 mg via ORAL
  Filled 2017-12-20: qty 2

## 2017-12-20 MED ORDER — TRAMADOL HCL 50 MG PO TABS
50.0000 mg | ORAL_TABLET | Freq: Two times a day (BID) | ORAL | Status: DC | PRN
  Administered 2017-12-21: 08:00:00 50 mg via ORAL
  Filled 2017-12-20: qty 1

## 2017-12-20 MED ORDER — SENNOSIDES-DOCUSATE SODIUM 8.6-50 MG PO TABS: 1.0000 | ORAL_TABLET | Freq: Every evening | ORAL | Status: DC | PRN

## 2017-12-20 MED ORDER — HYDRALAZINE HCL 20 MG/ML IJ SOLN: 10.0000 mg | Freq: Four times a day (QID) | INTRAMUSCULAR | Status: DC | PRN

## 2017-12-20 MED ORDER — METOPROLOL SUCCINATE ER 50 MG PO TB24
50.0000 mg | ORAL_TABLET | Freq: Every day | ORAL | Status: DC
  Administered 2017-12-21: 08:00:00 50 mg via ORAL
  Filled 2017-12-20: qty 1

## 2017-12-20 MED ORDER — ASPIRIN 81 MG PO CHEW
324.0000 mg | CHEWABLE_TABLET | Freq: Once | ORAL | Status: AC
  Administered 2017-12-20: 324 mg via ORAL
  Filled 2017-12-20: qty 4

## 2017-12-20 MED ORDER — CYCLOBENZAPRINE HCL 10 MG PO TABS: 5.0000 mg | ORAL_TABLET | Freq: Two times a day (BID) | ORAL | Status: DC | PRN

## 2017-12-20 MED ORDER — VENLAFAXINE HCL ER 75 MG PO CP24
225.0000 mg | ORAL_CAPSULE | Freq: Every day | ORAL | Status: DC
  Administered 2017-12-21: 08:00:00 225 mg via ORAL
  Filled 2017-12-20: qty 3

## 2017-12-20 MED ORDER — GABAPENTIN 100 MG PO CAPS
100.0000 mg | ORAL_CAPSULE | Freq: Three times a day (TID) | ORAL | Status: DC
  Administered 2017-12-20 – 2017-12-21 (×3): 100 mg via ORAL
  Filled 2017-12-20 (×3): qty 1

## 2017-12-20 NOTE — Progress Notes (Signed)
*  PRELIMINARY RESULTS* Echocardiogram 2D Echocardiogram has been performed.  [place] 12/20/2017, 10:30 PM

## 2017-12-20 NOTE — ED Provider Notes (Signed)
[facility] Emergency Department Provider Note    First MD Initiated Contact with Patient 12/20/17 1104     (approximate)  I have reviewed the triage vital signs and the nursing notes.   HISTORY  Chief Complaint Weakness    HPI AVELEEN NEVERS is a 77 y.o. female presents to the ER for chief complaint of tingliness in both of her hands as well as nausea and generalized weakness.  States she was not feeling right around 10 AM.  She was driving her car to go to a hair appointment.  States that yesterday she is also feeling unwell and the daughter interjects during history taking saying that she noted some slurred speech yesterday.  Patient does have a history of stroke as well as subsequent head bleed after being on aspirin and Plavix.  She denies any abdominal pain.  No chest pain.  No shortness of breath.  States for the past several months she has been having shooting intermittent right-sided headaches and states that last night she also had a headache.  Denies any trauma.  Past Medical History:  Diagnosis Date  . Anxiety   . Arthritis    "shoulders" (08/20/2014)  . Asthma   . Chronic lower back pain   . Complication of anesthesia    "agitated & restless after knee replacement in 2008"  . COPD (chronic obstructive pulmonary disease) ([place])   . DDD (degenerative disc disease), lumbar   . Depression   . Diverticulosis   . GERD (gastroesophageal reflux disease)   . High cholesterol   . Hypertension   . Hypothyroidism   . On home oxygen therapy    "2L at night" (08/20/2014)  . Shortness of breath dyspnea    with exertion   Family History  Problem Relation Age of Onset  . Diabetes Mother   . Hypertension Mother   . Lung cancer Mother   . Congestive Heart Failure Father   . Congestive Heart Failure Sister    Past Surgical History:  Procedure Laterality Date  . CARDIAC CATHETERIZATION  ~ 2007  . EXCISIONAL HEMORRHOIDECTOMY  1960's  . EYE SURGERY  Bilateral    Cataract extraction with IOL  . JOINT REPLACEMENT Left 2009  . KNEE ARTHROSCOPY Left X 2 <2008  . KNEE ARTHROSCOPY WITH SUBCHONDROPLASTY Right 04/19/2016   Procedure: KNEE ARTHROSCOPY WITH SUBCHONDROPLASTY, PARTIAL MENISCECTOMY;  Surgeon: Hessie Knows, MD;  Location: [facility] ORS;  Service: Orthopedics;  Laterality: Right;  . SHOULDER OPEN ROTATOR CUFF REPAIR Right 2001  . TONSILLECTOMY  ~ 1950  . TOTAL KNEE ARTHROPLASTY Left 2008  . TOTAL KNEE ARTHROPLASTY Right 07/03/2016   Procedure: TOTAL KNEE ARTHROPLASTY;  Surgeon: Hessie Knows, MD;  Location: [facility] ORS;  Service: Orthopedics;  Laterality: Right;  . TUBAL LIGATION  1973  . VAGINAL HYSTERECTOMY  1974   Patient Active Problem List   Diagnosis Date Noted  . Occlusion and stenosis of bilateral carotid arteries 11/24/2017  . L brain TIA (transient ischemic attack) 08/24/2016  . Solitary pulmonary nodule 08/24/2016  . Aphasia   . Cellulitis of right knee 07/17/2016  . Primary localized osteoarthritis of right knee 07/03/2016  . Ileus ([place]) 04/24/2016  . Generalized abdominal pain   . Colitis due to Clostridium difficile   . Recurrent Clostridium difficile diarrhea   . Enteritis due to Clostridium difficile 08/22/2014  . Diarrhea 08/20/2014  . HTN (hypertension) 08/20/2014  . HLD (hyperlipidemia) 08/20/2014  . Hypothyroidism 08/20/2014  . Tobacco abuse 08/20/2014  .  COPD (chronic obstructive pulmonary disease) ([place]) 08/20/2014  . Back pain 08/20/2014      Prior to Admission medications   Medication Sig Start Date End Date Taking? Authorizing Provider  atorvastatin (LIPITOR) 10 MG tablet TAKE ONE TABLET BY MOUTH EVERY EVENING FOR CHOLESTEROL 08/27/17  Yes Boscia, Heather E, NP  calcium carbonate (OS-CAL) 1250 (500 Ca) MG chewable tablet Chew 1 tablet by mouth daily.    Yes [provider]  clopidogrel (PLAVIX) 75 MG tablet Take 1 tablet (75 mg total) by mouth daily. 08/25/16  Yes Donzetta Starch, NP  gabapentin  (NEURONTIN) 100 MG capsule Take 100 mg by mouth 3 (three) times daily.   Yes [provider]  levothyroxine (SYNTHROID, LEVOTHROID) 137 MCG tablet Take by mouth daily before breakfast.    Yes [provider]  metoprolol succinate (TOPROL-XL) 50 MG 24 hr tablet Take 1 tablet (50 mg total) by mouth daily. Take with or immediately following a meal. 11/04/17  Yes Boscia, Heather E, NP  venlafaxine XR (EFFEXOR-XR) 75 MG 24 hr capsule Take 3 capsules (225 mg total) by mouth daily with breakfast. 04/25/16  Yes Max Sane, MD  albuterol (PROVENTIL HFA;VENTOLIN HFA) 108 (90 BASE) MCG/ACT inhaler Inhale 2 puffs into the lungs every 6 (six) hours as needed for wheezing or shortness of breath. 08/28/14   Rai, Ripudeep K, MD  albuterol (PROVENTIL) (2.5 MG/3ML) 0.083% nebulizer solution Take 3 mLs (2.5 mg total) by nebulization every 6 (six) hours as needed for wheezing or shortness of breath. 08/24/16   Donzetta Starch, NP  calcium carbonate (TUMS - DOSED IN MG ELEMENTAL CALCIUM) 500 MG chewable tablet Chew 2 tablets by mouth daily.    [provider]  cyclobenzaprine (FLEXERIL) 5 MG tablet Take 5 mg by mouth 2 (two) times daily as needed for muscle spasms.    [provider]  docusate sodium (COLACE) 100 MG capsule Take 100 mg by mouth at bedtime.    [provider]  HYDROcodone-acetaminophen (NORCO) 10-325 MG tablet Take 1-2 tablets by mouth every 4 (four) hours as needed for moderate pain. Patient not taking: Reported on 08/02/2017 07/05/16   Reche Dixon, PA-C  ondansetron [facility]) 4 MG tablet Take 1 tablet (4 mg total) by mouth every 8 (eight) hours as needed for nausea or vomiting. 08/02/17   Ronnell Freshwater, NP  oxyCODONE-acetaminophen (PERCOCET) 7.5-325 MG tablet Take 2 tablets by mouth every 4 (four) hours as needed for moderate pain. Patient not taking: Reported on 08/02/2017 07/20/16   Lattie Corns, PA-C  traMADol (ULTRAM) 50 MG tablet Take 1 tablet (50 mg  total) by mouth 3 (three) times daily as needed (for pain). Patient not taking: Reported on 12/20/2017 11/04/17   Ronnell Freshwater, NP    Allergies Heparin and No known allergies    Social History Social History   Tobacco Use  . Smoking status: Current Every Day Smoker    Packs/day: 0.50    Years: 53.00    Pack years: 26.50    Types: Cigarettes  . Smokeless tobacco: Never Used  Substance Use Topics  . Alcohol use: No  . Drug use: No    Review of Systems Patient denies headaches, rhinorrhea, blurry vision, numbness, shortness of breath, chest pain, edema, cough, abdominal pain, nausea, vomiting, diarrhea, dysuria, fevers, rashes or hallucinations unless otherwise stated above in HPI. ____________________________________________   PHYSICAL EXAM:  VITAL SIGNS: Vitals:   12/20/17 1150 12/20/17 1200  BP: (!) 171/56   Pulse:  91   Resp: 20   Temp:  98.8 F (37.1 C)  SpO2: 94%     Constitutional: Alert and oriented. Well appearing and in no acute distress. Eyes: Conjunctivae are normal.  Head: Atraumatic. Nose: No congestion/rhinnorhea. Mouth/Throat: Mucous membranes are moist.   Neck: No stridor. Painless ROM.  Cardiovascular: Normal rate, regular rhythm. Grossly normal heart sounds.  Good peripheral circulation. Respiratory: Normal respiratory effort.  No retractions. Lungs CTAB. Gastrointestinal: Soft and nontender. No distention. No abdominal bruits. No CVA tenderness. Genitourinary:  Musculoskeletal: No lower extremity tenderness nor edema.  No joint effusions. Neurologic:  CN- intact.  No facial droop, Normal FNF. RUE resting tremor,  LUE drift  Normal heel to shin.  Sensation intact bilaterally. Stutter and confusion about events over the past day. Skin:  Skin is warm, dry and intact. No rash noted. Psychiatric: Mood and affect are normal.  ____________________________________________   LABS (all labs ordered are listed, but only abnormal results are  displayed)  Results for orders placed or performed during the hospital encounter of 12/20/17 (from the past 24 hour(s))  Basic metabolic panel     Status: Abnormal   Collection Time: 12/20/17 11:03 AM  Result Value Ref Range   Sodium 139 135 - 145 mmol/L   Potassium 3.3 (L) 3.5 - 5.1 mmol/L   Chloride 103 101 - 111 mmol/L   CO2 26 22 - 32 mmol/L   Glucose, Bld 103 (H) 65 - 99 mg/dL   BUN 11 6 - 20 mg/dL   Creatinine, Ser 0.89 0.44 - 1.00 mg/dL   Calcium 9.1 8.9 - 10.3 mg/dL   GFR calc non Af Amer >60 >60 mL/min   GFR calc Af Amer >60 >60 mL/min   Anion gap 10 5 - 15  CBC     Status: Abnormal   Collection Time: 12/20/17 11:03 AM  Result Value Ref Range   WBC 8.3 3.6 - 11.0 K/uL   RBC 4.35 3.80 - 5.20 MIL/uL   Hemoglobin 11.5 (L) 12.0 - 16.0 g/dL   HCT 34.5 (L) 35.0 - 47.0 %   MCV 79.2 (L) 80.0 - 100.0 fL   MCH 26.3 26.0 - 34.0 pg   MCHC 33.3 32.0 - 36.0 g/dL   RDW 15.6 (H) 11.5 - 14.5 %   Platelets 294 150 - 440 K/uL  Protime-INR     Status: None   Collection Time: 12/20/17 11:03 AM  Result Value Ref Range   Prothrombin Time 12.8 11.4 - 15.2 seconds   INR 0.97   APTT     Status: Abnormal   Collection Time: 12/20/17 11:03 AM  Result Value Ref Range   aPTT 41 (H) 24 - 36 seconds  Urinalysis, Complete w Microscopic     Status: Abnormal   Collection Time: 12/20/17 12:50 PM  Result Value Ref Range   Color, Urine STRAW (A) YELLOW   APPearance CLEAR (A) CLEAR   Specific Gravity, Urine 1.004 (L) 1.005 - 1.030   pH 6.0 5.0 - 8.0   Glucose, UA NEGATIVE NEGATIVE mg/dL   Hgb urine dipstick NEGATIVE NEGATIVE   Bilirubin Urine NEGATIVE NEGATIVE   Ketones, ur NEGATIVE NEGATIVE mg/dL   Protein, ur NEGATIVE NEGATIVE mg/dL   Nitrite NEGATIVE NEGATIVE   Leukocytes, UA NEGATIVE NEGATIVE   RBC / HPF 0-5 0 - 5 RBC/hpf   WBC, UA 0-5 0 - 5 WBC/hpf   Bacteria, UA NONE SEEN NONE SEEN   Squamous Epithelial / LPF 0-5 0 - 5  ____________________________________________  EKG My review  and personal interpretation at Time: 10:57   Indication: tingling  Rate: 90  Rhythm: sinus Axis: normal Other: normal intervals, no stemi ____________________________________________  RADIOLOGY  I personally reviewed all radiographic images ordered to evaluate for the above acute complaints and reviewed radiology reports and findings.  These findings were personally discussed with the patient.  Please see medical record for radiology report.  ____________________________________________   PROCEDURES  Procedure(s) performed:  Procedures    Critical Care performed: no ____________________________________________   INITIAL IMPRESSION / ASSESSMENT AND PLAN / ED COURSE  Pertinent labs & imaging results that were available during my care of the patient were reviewed by me and considered in my medical decision making (see chart for details).  DDX: Dehydration, sepsis, pna, uti, hypoglycemia, cva, drug effect, withdrawal,   NADEEN SHIPMAN is a 77 y.o. who presents to the ED with chief complaint of symptoms as described above.  She is afebrile but hypertensive.  Does seem to have some subtle neuro deficits therefore CT imaging ordered to evaluate for the above differential.  Seems less clinically consistent with septic process or dehydration.  Possible UTI.  No hypoglycemia.  Certainly concerning for CVA or even mass given her age.  The patient will be placed on continuous pulse oximetry and telemetry for monitoring.  Laboratory evaluation will be sent to evaluate for the above complaints.     Clinical Course as of Dec 20 1416  Fri Dec 20, 2017  1151 CT imaging does show evidence of edema with probable acute infarct.  Will order MRI due to concern for underlying abscess versus mass and will also touch base with neurology.   [PR]  6578 MRI does show evidence of acute infarct.  Further discussion with patient and family it sounds like the symptoms did start yesterday.  Patient passed  swallow screen.  Will require hospitalization for further medical management.  To give aspirin.   [PR]  1418 Have discussed with the patient and available family all diagnostics and treatments performed thus far and all questions were answered to the best of my ability. The patient demonstrates understanding and agreement with plan.    [PR]    Clinical Course User Index [PR] Merlyn Lot, MD     As part of my medical decision making, I reviewed the following data within the [place] notes reviewed and incorporated, Labs reviewed, notes from prior ED visits and [place] Controlled Substance Database   ____________________________________________   FINAL CLINICAL IMPRESSION(S) / ED DIAGNOSES  Final diagnoses:  Cerebrovascular accident (CVA), unspecified mechanism ([place])      NEW MEDICATIONS STARTED DURING THIS VISIT:  New Prescriptions   No medications on file     Note:  This document was prepared using Dragon voice recognition software and may include unintentional dictation errors.    Merlyn Lot, MD 12/20/17 [phone]

## 2017-12-20 NOTE — ED Notes (Signed)
Pt resting quietly. Call bell in reach. Will continue to assess.

## 2017-12-20 NOTE — ED Notes (Signed)
Patient transported to MRI 

## 2017-12-20 NOTE — Progress Notes (Signed)
Chaplain provided AD education to patient, daughter, and son-in-law.  Patient grimaced several times due to headache pain. Patient passed screening questions and seemed to understand the concepts of the AD and able to make informed choices. Chaplain left the brochure with the family and asked them to have the nurse page the chaplain if they wished to proceed.

## 2017-12-20 NOTE — ED Triage Notes (Signed)
ARrives via [facility] from home.  Patient was driving to her hair appointment this morning and had stopped to get gas.  STates prior to stopping to get gas patient was not feeling "right" and c/o generilzed weakness and bilateral hand numbness.  Per EMS patient was hypervenilating a little bit.  Patient's daughter states patient was c/o generalized weakness and not feeling well yesterday.  BP:  200/110 / 203/80  HR:  103  CBG:  107  SPO2: 99% on RA.

## 2017-12-20 NOTE — ED Notes (Signed)
Pain medication administered. Call bell in reach. Will continue to assess.

## 2017-12-20 NOTE — Progress Notes (Signed)
Advance care planning  Purpose of Encounter Recurrent CVA and CODE STATUS discussion  Parties in Attendance Patient and Niece - HCPOA  Patients Decisional capacity Alert and oriented.  Able to make medical decisions  Met with patient and her healthcare power of attorney niece at bedside.  Patient is being admitted for acute CVA.  She has had prior CVA with intracranial bleed after being started on aspirin and Plavix.  She seems to have recovered well.  This time patient still is able to drive.  Ambulates on her own.  Today she felt extremely weak.  We discussed regarding patient's treatment plan and complications of recurrent stroke and increased risk of strokes in the future. CODE STATUS discussed and patient would like to be DO NOT RESUSCITATE and DO NOT INTUBATE.  Orders entered.  Goals of care determination Mild symptoms with stroke.  Good prognosis.  DO NOT RESUSCITATE  Time spent -18 minutes

## 2017-12-20 NOTE — ED Notes (Signed)
Advised pt that a urine sample was needed. Pt unable to provide it at this time. Call bell in reach. Will continue to assess.

## 2017-12-20 NOTE — ED Notes (Signed)
Pt speaking with MRI tech.

## 2017-12-20 NOTE — ED Notes (Signed)
Assisted the patient to the restroom. Patient voided and a urine sample was sent to the lab.

## 2017-12-20 NOTE — ED Notes (Signed)
Patient transported to Ultrasound 

## 2017-12-20 NOTE — ED Notes (Signed)
Pt reports was leaving to go get her hair done and all the sudden start feeling nauseated and like she had pins and needles in her hands. Pt denies pain, SOB other sx's.

## 2017-12-20 NOTE — H&P (Signed)
[facility] NAME: [name] Douglas    MR#:  [id number]  DATE OF BIRTH:  1941/06/09  DATE OF ADMISSION:  12/20/2017  PRIMARY CARE PHYSICIAN: Lavera Guise, MD   REQUESTING/REFERRING PHYSICIAN: Dr. Quentin Cornwall  CHIEF COMPLAINT:   Chief Complaint  Patient presents with  . Weakness    HISTORY OF PRESENT ILLNESS:  [name] Douglas  is a 77 y.o. female with a known history of hypertension, CVA, carotid stenosis, CAD presents to the hospital after she noticed acute generalized weakness and tingling in her hands at a gas station.  Patient had worsening of the symptoms and was brought to the emergency room.  Daughter also noticed some staring episodes and slurred speech yesterday which have resolved.  Presently patient continues to feel weak.  Just keeps saying she does not feel right.  Does not have any focal weakness or numbness.  No trouble swallowing.  No change in vision.  Patient had a CT scan of the head which showed possible mass versus stroke.  MRI was followed which shows bilateral acute CVAs of the right parietal lobe and left centrum semiovale.  She did have an acute CVA in January 2017 after which she was started on aspirin and Plavix.  Then had a fall and intracranial bleed in April.  After this her aspirin was stopped and patient was continued on Plavix.  PAST MEDICAL HISTORY:   Past Medical History:  Diagnosis Date  . Anxiety   . Arthritis    "shoulders" (08/20/2014)  . Asthma   . Chronic lower back pain   . Complication of anesthesia    "agitated & restless after knee replacement in 2008"  . COPD (chronic obstructive pulmonary disease) ([place])   . DDD (degenerative disc disease), lumbar   . Depression   . Diverticulosis   . GERD (gastroesophageal reflux disease)   . High cholesterol   . Hypertension   . Hypothyroidism   . On home oxygen therapy    "2L at night" (08/20/2014)  . Shortness of breath dyspnea    with exertion     PAST SURGICAL HISTORY:   Past Surgical History:  Procedure Laterality Date  . CARDIAC CATHETERIZATION  ~ 2007  . EXCISIONAL HEMORRHOIDECTOMY  1960's  . EYE SURGERY Bilateral    Cataract extraction with IOL  . JOINT REPLACEMENT Left 2009  . KNEE ARTHROSCOPY Left X 2 <2008  . KNEE ARTHROSCOPY WITH SUBCHONDROPLASTY Right 04/19/2016   Procedure: KNEE ARTHROSCOPY WITH SUBCHONDROPLASTY, PARTIAL MENISCECTOMY;  Surgeon: Hessie Knows, MD;  Location: [facility] ORS;  Service: Orthopedics;  Laterality: Right;  . SHOULDER OPEN ROTATOR CUFF REPAIR Right 2001  . TONSILLECTOMY  ~ 1950  . TOTAL KNEE ARTHROPLASTY Left 2008  . TOTAL KNEE ARTHROPLASTY Right 07/03/2016   Procedure: TOTAL KNEE ARTHROPLASTY;  Surgeon: Hessie Knows, MD;  Location: [facility] ORS;  Service: Orthopedics;  Laterality: Right;  . TUBAL LIGATION  1973  . VAGINAL HYSTERECTOMY  1974    SOCIAL HISTORY:   Social History   Tobacco Use  . Smoking status: Current Every Day Smoker    Packs/day: 0.50    Years: 53.00    Pack years: 26.50    Types: Cigarettes  . Smokeless tobacco: Never Used  Substance Use Topics  . Alcohol use: No    FAMILY HISTORY:   Family History  Problem Relation Age of Onset  . Diabetes Mother   . Hypertension Mother   . Lung cancer Mother   .  Congestive Heart Failure Father   . Congestive Heart Failure Sister     DRUG ALLERGIES:   Allergies  Allergen Reactions  . Heparin Nausea And Vomiting  . No Known Allergies     REVIEW OF SYSTEMS:   Review of Systems  Constitutional: Positive for malaise/fatigue. Negative for chills and fever.  HENT: Negative for sore throat.   Eyes: Negative for blurred vision, double vision and pain.  Respiratory: Negative for cough, hemoptysis, shortness of breath and wheezing.   Cardiovascular: Negative for chest pain, palpitations, orthopnea and leg swelling.  Gastrointestinal: Negative for abdominal pain, constipation, diarrhea, heartburn, nausea and vomiting.   Genitourinary: Negative for dysuria and hematuria.  Musculoskeletal: Negative for back pain and joint pain.  Skin: Negative for rash.  Neurological: Positive for dizziness, tingling and weakness. Negative for sensory change, speech change, focal weakness and headaches.  Endo/Heme/Allergies: Does not bruise/bleed easily.  Psychiatric/Behavioral: Negative for depression. The patient is not nervous/anxious.     MEDICATIONS AT HOME:   Prior to Admission medications   Medication Sig Start Date End Date Taking? Authorizing Provider  atorvastatin (LIPITOR) 10 MG tablet TAKE ONE TABLET BY MOUTH EVERY EVENING FOR CHOLESTEROL 08/27/17  Yes Boscia, Heather E, NP  calcium carbonate (OS-CAL) 1250 (500 Ca) MG chewable tablet Chew 1 tablet by mouth daily.    Yes [provider]  clopidogrel (PLAVIX) 75 MG tablet Take 1 tablet (75 mg total) by mouth daily. 08/25/16  Yes Donzetta Starch, NP  gabapentin (NEURONTIN) 100 MG capsule Take 100 mg by mouth 3 (three) times daily.   Yes [provider]  levothyroxine (SYNTHROID, LEVOTHROID) 137 MCG tablet Take by mouth daily before breakfast.    Yes [provider]  metoprolol succinate (TOPROL-XL) 50 MG 24 hr tablet Take 1 tablet (50 mg total) by mouth daily. Take with or immediately following a meal. 11/04/17  Yes Boscia, Heather E, NP  venlafaxine XR (EFFEXOR-XR) 75 MG 24 hr capsule Take 3 capsules (225 mg total) by mouth daily with breakfast. 04/25/16  Yes Max Sane, MD  albuterol (PROVENTIL HFA;VENTOLIN HFA) 108 (90 BASE) MCG/ACT inhaler Inhale 2 puffs into the lungs every 6 (six) hours as needed for wheezing or shortness of breath. 08/28/14   Rai, Ripudeep K, MD  albuterol (PROVENTIL) (2.5 MG/3ML) 0.083% nebulizer solution Take 3 mLs (2.5 mg total) by nebulization every 6 (six) hours as needed for wheezing or shortness of breath. 08/24/16   Donzetta Starch, NP  calcium carbonate (TUMS - DOSED IN MG ELEMENTAL CALCIUM) 500 MG chewable tablet Chew 2  tablets by mouth daily.    [provider]  cyclobenzaprine (FLEXERIL) 5 MG tablet Take 5 mg by mouth 2 (two) times daily as needed for muscle spasms.    [provider]  docusate sodium (COLACE) 100 MG capsule Take 100 mg by mouth at bedtime.    [provider]  HYDROcodone-acetaminophen (NORCO) 10-325 MG tablet Take 1-2 tablets by mouth every 4 (four) hours as needed for moderate pain. Patient not taking: Reported on 08/02/2017 07/05/16   Reche Dixon, PA-C  ondansetron [facility]) 4 MG tablet Take 1 tablet (4 mg total) by mouth every 8 (eight) hours as needed for nausea or vomiting. 08/02/17   Ronnell Freshwater, NP  oxyCODONE-acetaminophen (PERCOCET) 7.5-325 MG tablet Take 2 tablets by mouth every 4 (four) hours as needed for moderate pain. Patient not taking: Reported on 08/02/2017 07/20/16   Lattie Corns, PA-C  traMADol Veatrice Bourbon) 50 MG tablet Take  1 tablet (50 mg total) by mouth 3 (three) times daily as needed (for pain). Patient not taking: Reported on 12/20/2017 11/04/17   Ronnell Freshwater, NP     VITAL SIGNS:  Blood pressure (!) 180/69, pulse 85, temperature 98.8 F (37.1 C), resp. rate 18, height 5\' 6"  (1.676 m), weight 98.9 kg (218 lb), SpO2 98 %.  PHYSICAL EXAMINATION:  Physical Exam  GENERAL:  77 y.o.-year-old patient lying in the bed with no acute distress.  EYES: Pupils equal, round, reactive to light and accommodation. No scleral icterus. Extraocular muscles intact.  HEENT: Head atraumatic, normocephalic. Oropharynx and nasopharynx clear. No oropharyngeal erythema, moist oral mucosa  NECK:  Supple, no jugular venous distention. No thyroid enlargement, no tenderness.  LUNGS: Normal breath sounds bilaterally, no wheezing, rales, rhonchi. No use of accessory muscles of respiration.  CARDIOVASCULAR: S1, S2 normal. No murmurs, rubs, or gallops.  ABDOMEN: Soft, nontender, nondistended. Bowel sounds present. No organomegaly or mass.  EXTREMITIES: No pedal  edema, cyanosis, or clubbing. + 2 pedal & radial pulses b/l.   NEUROLOGIC: Cranial nerves II through XII are intact. No focal Motor or sensory deficits appreciated b/l PSYCHIATRIC: The patient is alert and oriented x 3. Good affect.  SKIN: No obvious rash, lesion, or ulcer.   LABORATORY PANEL:   CBC Recent Labs  Lab 12/20/17 1103  WBC 8.3  HGB 11.5*  HCT 34.5*  PLT 294   ------------------------------------------------------------------------------------------------------------------  Chemistries  Recent Labs  Lab 12/20/17 1103  NA 139  K 3.3*  CL 103  CO2 26  GLUCOSE 103*  BUN 11  CREATININE 0.89  CALCIUM 9.1   ------------------------------------------------------------------------------------------------------------------  Cardiac Enzymes Recent Labs  Lab 12/20/17 1103  TROPONINI <0.03   ------------------------------------------------------------------------------------------------------------------  RADIOLOGY:  Ct Head Wo Contrast  Result Date: 12/20/2017 CLINICAL DATA:  Acute onset hand numbness and altered mental status EXAM: CT HEAD WITHOUT CONTRAST TECHNIQUE: Contiguous axial images were obtained from the base of the skull through the vertex without intravenous contrast. COMPARISON:  September 25, 2017 and December 08, 2016 FINDINGS: Brain: The ventricles are normal in size and configuration. There is a focal area of decreased attenuation in the right parietal lobe with what is felt to represent cytotoxic edema with localized sulcal effacement spanning an area measuring 4.3 x 3.8 x 3.1 cm. This area is concerning for acute infarct in the right parietal lobe involving a branch of the right middle cerebral artery. No similar changes are seen elsewhere. There is mild patchy periventricular small vessel disease in the centra semiovale bilaterally. There is no extra-axial fluid collection or midline shift. Vascular: No evident hyperdense vessel. There is calcification in  each carotid siphon region. Skull: The bony calvarium appears intact. Sinuses/Orbits: There is mucosal thickening in several ethmoid air cells. Other visualized paranasal sinuses are clear. Frontal sinuses are aplastic. Visualized orbits appear symmetric bilaterally. Other: Mastoid air cells are clear. There is debris in the left external auditory canal. IMPRESSION: 1. Localized edema and mass effect in right parietal lobe. Suspect acute infarct, particularly given the clinical history. An underlying mass or abscess with edema is a differential consideration. It may be prudent to correlate with brain MR pre and post-contrast to exclude a lesion other than acute infarct. 2.  Slight periventricular small vessel disease. 3.  No hemorrhage or extra-axial fluid collection. 4.  Foci of arterial vascular calcification bilaterally. 5.  Mucosal thickening in several ethmoid air cells. Electronically Signed   By: Lowella Grip III M.D.   On:  12/20/2017 11:41   Mr Jeri Cos And Wo Contrast  Result Date: 12/20/2017 CLINICAL DATA:  Focal deficit.  Stroke suspected EXAM: MRI HEAD WITHOUT AND WITH CONTRAST TECHNIQUE: Multiplanar, multiecho pulse sequences of the brain and surrounding structures were obtained without and with intravenous contrast. CONTRAST:  41mL MULTIHANCE GADOBENATE DIMEGLUMINE 529 MG/ML IV SOLN COMPARISON:  Head CT from earlier today.  Brain MRI 08/23/2016 FINDINGS: Brain: Large area of acute infarct centered in the right parietal lobe, extending into the posterior temporal cortex. Subcentimeter acute infarct in the left centrum semiovale. Negative for hemorrhagic conversion. Mild chronic small vessel ischemia in the periventricular white matter and pons. No hydrocephalus or collection. No abnormal intracranial enhancement. Vascular: Major intracranial flow voids are tortuous but preserved. Skull and upper cervical spine: No evidence of marrow lesion. Cervical facet arthropathy focally on the right at C3-4  with anterolisthesis. Sinuses/Orbits: Bilateral cataract resection. IMPRESSION: 1. Large acute infarct centered in the right parietal lobe. 2. Small acute infarct in the left centrum semiovale. 3. Moderate chronic small vessel ischemia in the cerebral white matter and pons. Electronically Signed   By: Monte Fantasia M.D.   On: 12/20/2017 14:00     IMPRESSION AND PLAN:   *Acute bilateral CVA.  Check echocardiogram and carotid Dopplers.  Consult neurology for further input regarding need for TEE.  Continue Plavix.  Hold aspirin at this time.  Statin. Consult physical therapy, occupational therapy and speech therapy. She has passed bedside swallow screen and will be started on a diet  *Hypertensive urgency.  Hold blood pressure medications for permissive hypertension. IV hydralazine added  DVT prophylaxis with Lovenox  All the records are reviewed and case discussed with ED provider. Management plans discussed with the patient, family and they are in agreement.  CODE STATUS: DNR  TOTAL TIME TAKING CARE OF THIS PATIENT: 40 minutes.   Leia Alf Keevon Henney M.D on 12/20/2017 at 3:00 PM  Between 7am to 6pm - Pager - [phone]  After 6pm go to [email or web address] - password [email or web address] [facility]  Office  [phone]  CC: Primary care physician; Lavera Guise, MD  Note: This dictation was prepared with Dragon dictation along with smaller phrase technology. Any transcriptional errors that result from this process are unintentional.

## 2017-12-20 NOTE — ED Notes (Signed)
Pt back from [place]. Spoke with Gerald Stabs, RN, advised pt was back and would be up to her room.

## 2017-12-20 NOTE — ED Notes (Signed)
Report called to [facility], 1c. [place] also here to get patient. Bronson Curb of the procedure prior to transport.

## 2017-12-21 DIAGNOSIS — Z720 Tobacco use: Secondary | ICD-10-CM | POA: Diagnosis not present

## 2017-12-21 DIAGNOSIS — I639 Cerebral infarction, unspecified: Principal | ICD-10-CM

## 2017-12-21 DIAGNOSIS — R531 Weakness: Secondary | ICD-10-CM | POA: Diagnosis not present

## 2017-12-21 DIAGNOSIS — F17200 Nicotine dependence, unspecified, uncomplicated: Secondary | ICD-10-CM | POA: Diagnosis not present

## 2017-12-21 DIAGNOSIS — I10 Essential (primary) hypertension: Secondary | ICD-10-CM | POA: Diagnosis not present

## 2017-12-21 LAB — LIPID PANEL
Cholesterol: 218 mg/dL — ABNORMAL HIGH (ref 0–200)
HDL: 52 mg/dL (ref >40)
LDL Cholesterol: 129 mg/dL — ABNORMAL HIGH (ref 0–99)
Total CHOL/HDL Ratio: 4.2 RATIO
Triglycerides: 185 mg/dL — ABNORMAL HIGH (ref <150)
VLDL: 37 mg/dL (ref 0–40)

## 2017-12-21 LAB — ECHOCARDIOGRAM COMPLETE
Height: 66 in
Weight: 3488 oz

## 2017-12-21 MED ORDER — ATORVASTATIN CALCIUM 80 MG PO TABS: 80.0000 mg | ORAL_TABLET | Freq: Every day | ORAL | 1 refills | Status: DC

## 2017-12-21 NOTE — Progress Notes (Signed)
Pt being discharged home with home health, discharge instructions and prescription reviewed with pt and daughter, states understanding, pt with no complaints at discharge

## 2017-12-21 NOTE — Discharge Instructions (Signed)
Heart healthy diet. °Smoking cessation. °

## 2017-12-21 NOTE — Progress Notes (Signed)
[place] received a page that patient was interested in completing a HCPOA. [place] attempted to visit with patient; however, medical staff was in the room. [place] will be rounding on that unit this afternoon and will stop by to share information and assist the patient with completing the forms.

## 2017-12-21 NOTE — Consult Note (Signed)
Reason for Consult:stroke  Referring Physician: Dr. Bridgett Larsson   CC: dysarthria   HPI: [name] Douglas is an 77 y.o. female female with a known history of hypertension, CVA, carotid stenosis, CAD presents to the hospital after she noticed acute generalized weakness and tingling in her hands at a gas station along with dysarthria. Pt has history of stroke in 2017 as well as fall that was associated with intracranial hemorrhage.  Presents with R parietal and L centrum ovale stroke.    Past Medical History:  Diagnosis Date  . Anxiety   . Arthritis    "shoulders" (08/20/2014)  . Asthma   . Chronic lower back pain   . Complication of anesthesia    "agitated & restless after knee replacement in 2008"  . COPD (chronic obstructive pulmonary disease) ([place])   . DDD (degenerative disc disease), lumbar   . Depression   . Diverticulosis   . GERD (gastroesophageal reflux disease)   . High cholesterol   . Hypertension   . Hypothyroidism   . On home oxygen therapy    "2L at night" (08/20/2014)  . Shortness of breath dyspnea    with exertion    Past Surgical History:  Procedure Laterality Date  . CARDIAC CATHETERIZATION  ~ 2007  . EXCISIONAL HEMORRHOIDECTOMY  1960's  . EYE SURGERY Bilateral    Cataract extraction with IOL  . JOINT REPLACEMENT Left 2009  . KNEE ARTHROSCOPY Left X 2 <2008  . KNEE ARTHROSCOPY WITH SUBCHONDROPLASTY Right 04/19/2016   Procedure: KNEE ARTHROSCOPY WITH SUBCHONDROPLASTY, PARTIAL MENISCECTOMY;  Surgeon: Hessie Knows, MD;  Location: [facility] ORS;  Service: Orthopedics;  Laterality: Right;  . SHOULDER OPEN ROTATOR CUFF REPAIR Right 2001  . TONSILLECTOMY  ~ 1950  . TOTAL KNEE ARTHROPLASTY Left 2008  . TOTAL KNEE ARTHROPLASTY Right 07/03/2016   Procedure: TOTAL KNEE ARTHROPLASTY;  Surgeon: Hessie Knows, MD;  Location: [facility] ORS;  Service: Orthopedics;  Laterality: Right;  . TUBAL LIGATION  1973  . VAGINAL HYSTERECTOMY  1974    Family History  Problem Relation Age of Onset  .  Diabetes Mother   . Hypertension Mother   . Lung cancer Mother   . Congestive Heart Failure Father   . Congestive Heart Failure Sister     Social History:  reports that she has been smoking cigarettes.  She has a 26.50 pack-year smoking history. She has never used smokeless tobacco. She reports that she does not drink alcohol or use drugs.  Allergies  Allergen Reactions  . Heparin Nausea And Vomiting    Medications: I have reviewed the patient's current medications.  ROS: History obtained from the patient  General ROS: negative for - chills, fatigue, fever, night sweats, weight gain or weight loss Psychological ROS: negative for - behavioral disorder, hallucinations, memory difficulties, mood swings or suicidal ideation Ophthalmic ROS: negative for - blurry vision, double vision, eye pain or loss of vision ENT ROS: negative for - epistaxis, nasal discharge, oral lesions, sore throat, tinnitus or vertigo Allergy and Immunology ROS: negative for - hives or itchy/watery eyes Hematological and Lymphatic ROS: negative for - bleeding problems, bruising or swollen lymph nodes Endocrine ROS: negative for - galactorrhea, hair pattern changes, polydipsia/polyuria or temperature intolerance Respiratory ROS: negative for - cough, hemoptysis, shortness of breath or wheezing Cardiovascular ROS: negative for - chest pain, dyspnea on exertion, edema or irregular heartbeat Gastrointestinal ROS: negative for - abdominal pain, diarrhea, hematemesis, nausea/vomiting or stool incontinence Genito-Urinary ROS: negative for - dysuria, hematuria, incontinence or urinary  frequency/urgency Musculoskeletal ROS: negative for - joint swelling or muscular weakness Neurological ROS: as noted in HPI Dermatological ROS: negative for rash and skin lesion changes  Physical Examination: Blood pressure (!) 148/87, pulse 90, temperature 98.1 F (36.7 C), temperature source Oral, resp. rate 20, height 5\' 6"  (1.676 m),  weight 218 lb (98.9 kg), SpO2 97 %.  Neurological Examination   Mental Status: Alert, oriented, thought content appropriate.  Speech fluent without evidence of aphasia.  Able to follow 3 step commands without difficulty. Cranial Nerves: II: Discs flat bilaterally; Visual fields grossly normal, pupils equal, round, reactive to light and accommodation III,IV, VI: ptosis not present, extra-ocular motions intact bilaterally V,VII: smile symmetric, facial light touch sensation normal bilaterally VIII: hearing normal bilaterally IX,X: gag reflex present XI: bilateral shoulder shrug XII: midline tongue extension Motor: Right : Upper extremity   5/5    Left:     Upper extremity   5/5  Lower extremity   5/5     Lower extremity   5/5 Tone and bulk:normal tone throughout; no atrophy noted Sensory: Pinprick and light touch intact throughout, bilaterally Deep Tendon Reflexes: 1+ and symmetric throughout Plantars: Right: downgoing   Left: downgoing Cerebellar: normal finger-to-nose, normal rapid alternating movements and normal heel-to-shin test Gait: not tested       Laboratory Studies:   Basic Metabolic Panel: Recent Labs  Lab 12/20/17 1103  NA 139  K 3.3*  CL 103  CO2 26  GLUCOSE 103*  BUN 11  CREATININE 0.89  CALCIUM 9.1    Liver Function Tests: No results for input(s): AST, ALT, ALKPHOS, BILITOT, PROT, ALBUMIN in the last 168 hours. No results for input(s): LIPASE, AMYLASE in the last 168 hours. No results for input(s): AMMONIA in the last 168 hours.  CBC: Recent Labs  Lab 12/20/17 1103  WBC 8.3  HGB 11.5*  HCT 34.5*  MCV 79.2*  PLT 294    Cardiac Enzymes: Recent Labs  Lab 12/20/17 1103  TROPONINI <0.03    BNP: Invalid input(s): POCBNP  CBG: No results for input(s): GLUCAP in the last 168 hours.  Microbiology: Results for orders placed or performed during the hospital encounter of 08/22/16  MRSA PCR Screening     Status: None   Collection Time:  08/22/16  4:10 PM  Result Value Ref Range Status   MRSA by PCR NEGATIVE NEGATIVE Final    Comment:        The GeneXpert MRSA Assay (FDA approved for NASAL specimens only), is one component of a comprehensive MRSA colonization surveillance program. It is not intended to diagnose MRSA infection nor to guide or monitor treatment for MRSA infections.     Coagulation Studies: Recent Labs    12/20/17 1103  LABPROT 12.8  INR 0.97    Urinalysis:  Recent Labs  Lab 12/20/17 1250  COLORURINE STRAW*  LABSPEC 1.004*  PHURINE 6.0  GLUCOSEU NEGATIVE  HGBUR NEGATIVE  BILIRUBINUR NEGATIVE  KETONESUR NEGATIVE  PROTEINUR NEGATIVE  NITRITE NEGATIVE  LEUKOCYTESUR NEGATIVE    Lipid Panel:     Component Value Date/Time   CHOL 218 (H) 12/21/2017 0449   TRIG 185 (H) 12/21/2017 0449   HDL 52 12/21/2017 0449   CHOLHDL 4.2 12/21/2017 0449   VLDL 37 12/21/2017 0449   LDLCALC 129 (H) 12/21/2017 0449    HgbA1C:  Lab Results  Component Value Date   HGBA1C 6.2 (H) 12/20/2017    Urine Drug Screen:  No results found for: LABOPIA, COCAINSCRNUR, [place], [place], [place],  LABBARB  Alcohol Level:  Recent Labs  Lab 12/20/17 1103  ETH <10    Other results: EKG: normal EKG, normal sinus rhythm, unchanged from previous tracings.  Imaging: Ct Head Wo Contrast  Result Date: 12/20/2017 CLINICAL DATA:  Acute onset hand numbness and altered mental status EXAM: CT HEAD WITHOUT CONTRAST TECHNIQUE: Contiguous axial images were obtained from the base of the skull through the vertex without intravenous contrast. COMPARISON:  September 25, 2017 and December 08, 2016 FINDINGS: Brain: The ventricles are normal in size and configuration. There is a focal area of decreased attenuation in the right parietal lobe with what is felt to represent cytotoxic edema with localized sulcal effacement spanning an area measuring 4.3 x 3.8 x 3.1 cm. This area is concerning for acute infarct in the right parietal lobe  involving a branch of the right middle cerebral artery. No similar changes are seen elsewhere. There is mild patchy periventricular small vessel disease in the centra semiovale bilaterally. There is no extra-axial fluid collection or midline shift. Vascular: No evident hyperdense vessel. There is calcification in each carotid siphon region. Skull: The bony calvarium appears intact. Sinuses/Orbits: There is mucosal thickening in several ethmoid air cells. Other visualized paranasal sinuses are clear. Frontal sinuses are aplastic. Visualized orbits appear symmetric bilaterally. Other: Mastoid air cells are clear. There is debris in the left external auditory canal. IMPRESSION: 1. Localized edema and mass effect in right parietal lobe. Suspect acute infarct, particularly given the clinical history. An underlying mass or abscess with edema is a differential consideration. It may be prudent to correlate with brain MR pre and post-contrast to exclude a lesion other than acute infarct. 2.  Slight periventricular small vessel disease. 3.  No hemorrhage or extra-axial fluid collection. 4.  Foci of arterial vascular calcification bilaterally. 5.  Mucosal thickening in several ethmoid air cells. Electronically Signed   By: Lowella Grip III M.D.   On: 12/20/2017 11:41   Mr Brain W And Wo Contrast  Result Date: 12/20/2017 CLINICAL DATA:  Focal deficit.  Stroke suspected EXAM: MRI HEAD WITHOUT AND WITH CONTRAST TECHNIQUE: Multiplanar, multiecho pulse sequences of the brain and surrounding structures were obtained without and with intravenous contrast. CONTRAST:  3mL MULTIHANCE GADOBENATE DIMEGLUMINE 529 MG/ML IV SOLN COMPARISON:  Head CT from earlier today.  Brain MRI 08/23/2016 FINDINGS: Brain: Large area of acute infarct centered in the right parietal lobe, extending into the posterior temporal cortex. Subcentimeter acute infarct in the left centrum semiovale. Negative for hemorrhagic conversion. Mild chronic small  vessel ischemia in the periventricular white matter and pons. No hydrocephalus or collection. No abnormal intracranial enhancement. Vascular: Major intracranial flow voids are tortuous but preserved. Skull and upper cervical spine: No evidence of marrow lesion. Cervical facet arthropathy focally on the right at C3-4 with anterolisthesis. Sinuses/Orbits: Bilateral cataract resection. IMPRESSION: 1. Large acute infarct centered in the right parietal lobe. 2. Small acute infarct in the left centrum semiovale. 3. Moderate chronic small vessel ischemia in the cerebral white matter and pons. Electronically Signed   By: Monte Fantasia M.D.   On: 12/20/2017 14:00   US Carotid Bilateral (at [facility] And [facility] Only)  Result Date: 12/20/2017 CLINICAL DATA:  77 year old female with a history of cerebrovascular accident Cardiovascular risk factors include hypertension, stroke/TIA, hyperlipidemia, tobacco use EXAM: BILATERAL CAROTID DUPLEX ULTRASOUND TECHNIQUE: Pearline Cables scale imaging, color Doppler and duplex ultrasound were performed of bilateral carotid and vertebral arteries in the neck. COMPARISON:  None. FINDINGS: Criteria: Quantification of carotid stenosis  is based on velocity parameters that correlate the residual internal carotid diameter with NASCET-based stenosis levels, using the diameter of the distal internal carotid lumen as the denominator for stenosis measurement. The following velocity measurements were obtained: RIGHT ICA:  Systolic 89 cm/sec, Diastolic 7 cm/sec CCA:  294 cm/sec SYSTOLIC ICA/CCA RATIO:  0.8 ECA:  217 cm/sec LEFT ICA:  Systolic 765 cm/sec, Diastolic 16 cm/sec CCA:  465 cm/sec SYSTOLIC ICA/CCA RATIO:  0.9 ECA:  149 cm/sec Right Brachial SBP: Not acquired Left Brachial SBP: Not acquired RIGHT CAROTID ARTERY: No significant calcifications of the right common carotid artery. Intermediate waveform maintained. Heterogeneous and partially calcified plaque at the right carotid bifurcation. No significant lumen  shadowing. Low resistance waveform of the right ICA. No significant tortuosity. RIGHT VERTEBRAL ARTERY: Antegrade flow with low resistance waveform. LEFT CAROTID ARTERY: No significant calcifications of the left common carotid artery. Intermediate waveform maintained. Heterogeneous and partially calcified plaque at the left carotid bifurcation without significant lumen shadowing. Low resistance waveform of the left ICA. No significant tortuosity. LEFT VERTEBRAL ARTERY:  Antegrade flow with low resistance waveform. IMPRESSION: Color duplex indicates minimal heterogeneous and calcified plaque, with no hemodynamically significant stenosis by duplex criteria in the extracranial cerebrovascular circulation. Signed, Dulcy Fanny. Earleen Newport, DO [facility] Electronically Signed   By: Corrie Mckusick D.O.   On: 12/20/2017 16:09     Assessment/Plan:  77 y.o. female female with a known history of hypertension, CVA, carotid stenosis, CAD presents to the hospital after she noticed acute generalized weakness and tingling in her hands at a gas station along with dysarthria. Pt has history of stroke in 2017 as well as fall that was associated with intracranial hemorrhage.  Presents with R parietal and L centrum ovale stroke.    - She is currently back to baseline. - Was evaluated by PT and speech without any abnormalities - Prior stroke was on 2 week holter monitor - Currently on plavix which we should con't on d/c - US carotid no hemodynamic stenosis - d/c planning - cardiology at out pt for possible implantable  LINQ - Statin therapy  12/21/2017, 11:36 AM

## 2017-12-21 NOTE — Care Management Note (Signed)
Case Management Note  Patient Details  Name: SAARAH DEWING MRN: [id number] Date of Birth: 1941-08-13   Patient to discharge home today.  Patient lives at home alone.  PCP Humphrey Rolls.  Patient has been assessed by PT and recommends home health PT.  Patient has been open with [place] and would like to use them again. Referral made to [facility] with [place].  Requested order from MD.  No medical equipment needs at discharge.  RNCM signing off.   Subjective/Objective:                    Action/Plan:   Expected Discharge Date:  12/21/17               Expected Discharge Plan:  [place]  In-House Referral:     Discharge planning Services  CM Consult  Post Acute Care Choice:  Home Health Choice offered to:  Patient  DME Arranged:    DME Agency:     HH Arranged:    [place]:  [place]  Status of Service:  Completed, signed off  If discussed at [place] of Stay Meetings, dates discussed:    Additional Comments:  Beverly Sessions, RN 12/21/2017, 11:50 AM

## 2017-12-21 NOTE — Care Management Obs Status (Signed)
[place] NOTIFICATION   Patient Details  Name: [name] Douglas MRN: [id number] Date of Birth: [birth year]   Medicare Observation Status Notification Given:  No(Admitted obs less than 24 hours)    Beverly Sessions, RN 12/21/2017, 11:49 AM

## 2017-12-21 NOTE — Progress Notes (Signed)
SLP Cancellation Note  Patient Details Name: [name] Douglas MRN: [id number] DOB: [birth year]   Cancelled treatment:       Reason Eval/Treat Not Completed: SLP screened, no needs identified, will sign off(chart reviewed; consulted NSG then met w/ pt.). Pt denied any difficulty swallowing and is currently on a regular diet; tolerates swallowing pills w/ water per NSG. Pt conversed at conversational level w/out deficits noted; pt and Daughter denied any speech-language deficits.  No further skilled ST services indicated as pt appears at her baseline. Pt agreed. NSG to reconsult if any change in status.    [name], MS, CCC-SLP Horice Carrero 12/21/2017, 8:46 AM

## 2017-12-21 NOTE — Evaluation (Signed)
Physical Therapy Evaluation Patient Details Name: [name] Douglas MRN: [id number] DOB: 02-11-1941 Today's Date: 12/21/2017   History of Present Illness  presented to ER secondary to acute onset weakness, tingling in bilat UEs; admitted for acute bilat CVA.  MRI significant for R parietal, L centrum semiovale.  Clinical Impression  Upon evaluation, patient alert and oriented; follows all commands and demonstrates good insight/safety awareness.  Bilat UE/LE strength and ROM grossly symmetrical and WFL; mild weakness noted L ankle DF (3+/5).  Denies persistent sensory deficit, reporting all sensation, coordination returned to baseline.  Good sitting balance and functional reach (>8"); fair standing balance/functional reach (approx 4").  Patient with good awareness of limits of stability, but tends to utilize UEs to reach for furniture/external stabilization for additional support.  Demonstrates ability to complete bed mobility with mod indep; sit/stand, basic transfers and gait (50-150') without assist device, min assist/with RW, cga.  Optimal balance/safety noted with use of RW; recommend continued use with all mobility efforts at this time. Would benefit from skilled PT to address above deficits and promote optimal return to PLOF; Recommend transition to [place] upon discharge from acute hospitalization.     Follow Up Recommendations Home health PT    Equipment Recommendations  (has RW)    Recommendations for Other Services       Precautions / Restrictions Precautions Precautions: Fall Restrictions Weight Bearing Restrictions: No      Mobility  Bed Mobility Overal bed mobility: Modified Independent                Transfers Overall transfer level: Needs assistance   Transfers: Sit to/from Stand Sit to Stand: Min guard         General transfer comment: requires use of bilat UEs for optimal safety  Ambulation/Gait Ambulation/Gait assistance: Min guard;Min assist Ambulation  Distance (Feet): 50 Feet Assistive device: None       General Gait Details: forward flexed posture, downward gaze (prefers to watch bilat feet); mild staggering, sway with head turns or changes of direction.  Relies heavily on LE step strategy and UE reaching/stabilizaiton for balance recovery  Stairs            Wheelchair Mobility    Modified Rankin (Stroke Patients Only)       Balance Overall balance assessment: Needs assistance Sitting-balance support: No upper extremity supported;Feet supported Sitting balance-Leahy Scale: Good Sitting balance - Comments: sitting functional reach >8", good balance and awareness of limits of stability   Standing balance support: Bilateral upper extremity supported Standing balance-Leahy Scale: Fair Standing balance comment: standing functional reach approx 4' Single Leg Stance - Right Leg: 2 Single Leg Stance - Left Leg: 1                         Pertinent Vitals/Pain Pain Assessment: No/denies pain    Home Living Family/patient expects to be discharged to:: Private residence Living Arrangements: Alone Available Help at Discharge: Family Type of Home: Apartment Home Access: Elevator     Home Layout: One level Home Equipment: Environmental consultant - 2 wheels;Cane - single point;Shower seat      Prior Function Level of Independence: Independent         Comments: Indep with ADLs, household and limited community distances without assist device; + driving; denies fall history within previous six months.     Hand Dominance        Extremity/Trunk Assessment   Upper Extremity Assessment Upper Extremity Assessment: Overall [facility]  for tasks assessed(bilat UEs grossly 4+/5 throughout; denies numbness/paresthesia; no gross sensory, coordination deficits)    Lower Extremity Assessment Lower Extremity Assessment: Generalized weakness(L ankle DF 3+/5, otherwise bilat LEs grossly 4+/5 throughout.  Denies numbness/paresthesia)        Communication   Communication: No difficulties  Cognition Arousal/Alertness: Awake/alert Behavior During Therapy: WFL for tasks assessed/performed Overall Cognitive Status: Within Functional Limits for tasks assessed                                        General Comments      Exercises Other Exercises Other Exercises: 150' with RW, cga--improved stepping pattern and gait fluidity/mechanics; min cuing for walker position and postural extension.  Do recommend continued use of RW with all mobility at discharge Other Exercises: Patient reporting history of intermittent staggering ("walking like I'm drunk at times"), has had negative work up from PCP thus far.  May benefit from vestibular assessment once patient over acute CVA symptoms/event.   Assessment/Plan    PT Assessment Patient needs continued PT services  PT Problem List         PT Treatment Interventions DME instruction;Gait training;Functional mobility training;Therapeutic activities;Therapeutic exercise;Balance training;Neuromuscular re-education;Cognitive remediation;Patient/family education    PT Goals (Current goals can be found in the Care Plan section)  Acute Rehab PT Goals Patient Stated Goal: to return home PT Goal Formulation: With patient/family Time For Goal Achievement: 01/04/18 Potential to Achieve Goals: Good    Frequency Min 2X/week   Barriers to discharge        Co-evaluation               AM-PAC PT "6 Clicks" Daily Activity  Outcome Measure Difficulty turning over in bed (including adjusting bedclothes, sheets and blankets)?: None Difficulty moving from lying on back to sitting on the side of the bed? : None Difficulty sitting down on and standing up from a chair with arms (e.g., wheelchair, bedside commode, etc,.)?: A Little Help needed moving to and from a bed to chair (including a wheelchair)?: A Little Help needed walking in hospital room?: A Little Help needed climbing  3-5 steps with a railing? : A Little 6 Click Score: 20    End of Session Equipment Utilized During Treatment: Gait belt Activity Tolerance: Patient tolerated treatment well Patient left: in chair;with chair alarm set;with call bell/phone within reach Nurse Communication: Mobility status PT Visit Diagnosis: Unsteadiness on feet (R26.81);Difficulty in walking, not elsewhere classified (R26.2);Hemiplegia and hemiparesis Hemiplegia - Right/Left: Right Hemiplegia - dominant/non-dominant: Dominant Hemiplegia - caused by: Cerebral infarction    Time: 0272-5366 PT Time Calculation (min) (ACUTE ONLY): 24 min   Charges:   PT Evaluation $PT Eval Moderate Complexity: 1 Mod PT Treatments $Gait Training: 8-22 mins   PT G Codes:        Lorain Fettes H. Owens Shark, PT, DPT, NCS 12/21/17, 12:22 PM [phone]

## 2017-12-21 NOTE — Progress Notes (Signed)
[facility] follow-up to complete HCPOA. [place] and notary located witnesses and completed the [facility] for the patient. [place] provided patient with three copies and left one on unit to be place in patient record. Patient stated she had been waiting all day for this as she was ready to be discharged. [place] thanked patient for her patience and provided prayer with patient and family before she was discharged.

## 2017-12-21 NOTE — Discharge Summary (Addendum)
[facility] [place] NAME: [name] Douglas    MR#:  [id number]  DATE OF BIRTH:  11/22/1940  DATE OF ADMISSION:  12/20/2017   ADMITTING PHYSICIAN: Hillary Bow, MD  DATE OF DISCHARGE: 12/21/2017  1:08 PM  PRIMARY CARE PHYSICIAN: Lavera Guise, MD   ADMISSION DIAGNOSIS:  CVA (cerebral vascular accident) [facility]) [I63.9] Cerebrovascular accident (CVA), unspecified mechanism ([place]) [I63.9] DISCHARGE DIAGNOSIS:  Active Problems:   Acute CVA (cerebrovascular accident) ([place])  SECONDARY DIAGNOSIS:   Past Medical History:  Diagnosis Date  . Anxiety   . Arthritis    "shoulders" (08/20/2014)  . Asthma   . Chronic lower back pain   . Complication of anesthesia    "agitated & restless after knee replacement in 2008"  . COPD (chronic obstructive pulmonary disease) ([place])   . DDD (degenerative disc disease), lumbar   . Depression   . Diverticulosis   . GERD (gastroesophageal reflux disease)   . High cholesterol   . Hypertension   . Hypothyroidism   . On home oxygen therapy    "2L at night" (08/20/2014)  . Shortness of breath dyspnea    with exertion   HOSPITAL COURSE:    *Acute bilateral CVA.  No cardiac source of emboli was indentified perechocardiogram and remarkable carotid Dopplers.   Continue Plavix and increase Lipitor to 80 mg p.o. Daily, follow-up with cardiology as outpatient for LINQ per Dr. Irish Elders. The patient improved to her baseline.  She has no complaints.  Hyperlipidemia.  LDL 129.  Lipitor 80 mg p.o. HS.  *Hypertensive urgency.  Hold blood pressure medications for permissive hypertension. IV hydralazine.  Resume hypertension medication after discharge.  Tobacco abuse.  Smoking cessation was counseled for 4 minutes. I discussed with Dr. Irish Elders. DISCHARGE CONDITIONS:  Stable, discharged to home with home health and PT today. CONSULTS OBTAINED:  Treatment Team:  Alexis Goodell, MD DRUG ALLERGIES:   Allergies    Allergen Reactions  . Heparin Nausea And Vomiting   DISCHARGE MEDICATIONS:   Allergies as of 12/21/2017      Reactions   Heparin Nausea And Vomiting      Medication List    STOP taking these medications   HYDROcodone-acetaminophen 10-325 MG tablet Commonly known as:  NORCO   oxyCODONE-acetaminophen 7.5-325 MG tablet Commonly known as:  PERCOCET   traMADol 50 MG tablet Commonly known as:  ULTRAM     TAKE these medications   albuterol 108 (90 Base) MCG/ACT inhaler Commonly known as:  PROVENTIL HFA;VENTOLIN HFA Inhale 2 puffs into the lungs every 6 (six) hours as needed for wheezing or shortness of breath.   albuterol (2.5 MG/3ML) 0.083% nebulizer solution Commonly known as:  PROVENTIL Take 3 mLs (2.5 mg total) by nebulization every 6 (six) hours as needed for wheezing or shortness of breath.   atorvastatin 80 MG tablet Commonly known as:  LIPITOR Take 1 tablet (80 mg total) by mouth daily at 6 PM. What changed:    medication strength  See the new instructions.   calcium carbonate 1250 (500 Ca) MG chewable tablet Commonly known as:  OS-CAL Chew 1 tablet by mouth daily.   calcium carbonate 500 MG chewable tablet Commonly known as:  TUMS - dosed in mg elemental calcium Chew 2 tablets by mouth daily.   clopidogrel 75 MG tablet Commonly known as:  PLAVIX Take 1 tablet (75 mg total) by mouth daily.   cyclobenzaprine 5 MG tablet Commonly known as:  FLEXERIL Take  5 mg by mouth 2 (two) times daily as needed for muscle spasms.   docusate sodium 100 MG capsule Commonly known as:  COLACE Take 100 mg by mouth at bedtime.   gabapentin 100 MG capsule Commonly known as:  NEURONTIN Take 100 mg by mouth 3 (three) times daily.   levothyroxine 137 MCG tablet Commonly known as:  SYNTHROID, LEVOTHROID Take by mouth daily before breakfast.   metoprolol succinate 50 MG 24 hr tablet Commonly known as:  TOPROL-XL Take 1 tablet (50 mg total) by mouth daily. Take with or  immediately following a meal.   ondansetron 4 MG tablet Commonly known as:  ZOFRAN Take 1 tablet (4 mg total) by mouth every 8 (eight) hours as needed for nausea or vomiting.   venlafaxine XR 75 MG 24 hr capsule Commonly known as:  EFFEXOR-XR Take 3 capsules (225 mg total) by mouth daily with breakfast.        DISCHARGE INSTRUCTIONS:  See AVS.  If you experience worsening of your admission symptoms, develop shortness of breath, life threatening emergency, suicidal or homicidal thoughts you must seek medical attention immediately by calling 911 or calling your MD immediately  if symptoms less severe.  You Must read complete instructions/literature along with all the possible adverse reactions/side effects for all the Medicines you take and that have been prescribed to you. Take any new Medicines after you have completely understood and accpet all the possible adverse reactions/side effects.   Please note  You were cared for by a hospitalist during your hospital stay. If you have any questions about your discharge medications or the care you received while you were in the hospital after you are discharged, you can call the unit and asked to speak with the hospitalist on call if the hospitalist that took care of you is not available. Once you are discharged, your primary care physician will handle any further medical issues. Please note that NO REFILLS for any discharge medications will be authorized once you are discharged, as it is imperative that you return to your primary care physician (or establish a relationship with a primary care physician if you do not have one) for your aftercare needs so that they can reassess your need for medications and monitor your lab values.    On the day of Discharge:  VITAL SIGNS:  Blood pressure (!) 149/56, pulse 80, temperature 97.9 F (36.6 C), temperature source Oral, resp. rate 20, height 5\' 6"  (1.676 m), weight 218 lb (98.9 kg), SpO2 98  %. PHYSICAL EXAMINATION:  GENERAL:  77 y.o.-year-old patient lying in the bed with no acute distress.  EYES: Pupils equal, round, reactive to light and accommodation. No scleral icterus. Extraocular muscles intact.  HEENT: Head atraumatic, normocephalic. Oropharynx and nasopharynx clear.  NECK:  Supple, no jugular venous distention. No thyroid enlargement, no tenderness.  LUNGS: Normal breath sounds bilaterally, no wheezing, rales,rhonchi or crepitation. No use of accessory muscles of respiration.  CARDIOVASCULAR: S1, S2 normal. No murmurs, rubs, or gallops.  ABDOMEN: Soft, non-tender, non-distended. Bowel sounds present. No organomegaly or mass.  EXTREMITIES: No pedal edema, cyanosis, or clubbing.  NEUROLOGIC: Cranial nerves II through XII are intact. Muscle strength 5/5 in all extremities. Sensation intact. Gait not checked.  PSYCHIATRIC: The patient is alert and oriented x 3.  SKIN: No obvious rash, lesion, or ulcer.  DATA REVIEW:   CBC Recent Labs  Lab 12/20/17 1103  WBC 8.3  HGB 11.5*  HCT 34.5*  PLT 294  Chemistries  Recent Labs  Lab 12/20/17 1103  NA 139  K 3.3*  CL 103  CO2 26  GLUCOSE 103*  BUN 11  CREATININE 0.89  CALCIUM 9.1     Microbiology Results  Results for orders placed or performed during the hospital encounter of 08/22/16  MRSA PCR Screening     Status: None   Collection Time: 08/22/16  4:10 PM  Result Value Ref Range Status   MRSA by PCR NEGATIVE NEGATIVE Final    Comment:        The GeneXpert MRSA Assay (FDA approved for NASAL specimens only), is one component of a comprehensive MRSA colonization surveillance program. It is not intended to diagnose MRSA infection nor to guide or monitor treatment for MRSA infections.     RADIOLOGY:  No results found.   Management plans discussed with the patient, her daughter and they are in agreement.  CODE STATUS: Prior   TOTAL TIME TAKING CARE OF THIS PATIENT: 37 minutes.    Demetrios Loll M.D  on 12/21/2017 at 4:36 PM  Between 7am to 6pm - Pager - [phone]  After 6pm go to [email or web address] - password [email or web address] [facility]  [employer] [facility]  Office  [phone]  CC: Primary care physician; Lavera Guise, MD   Note: This dictation was prepared with Dragon dictation along with smaller phrase technology. Any transcriptional errors that result from this process are unintentional.

## 2017-12-23 ENCOUNTER — Telehealth: Payer: Self-pay

## 2017-12-23 NOTE — Telephone Encounter (Signed)
Patient daughter calling Patient was seen in [facility] ED for CVA  Was discharged and told to make an appointment to see Dr. Rockey Situ in 1 week Would like sooner - added to waitlist

## 2017-12-24 NOTE — Telephone Encounter (Signed)
Unable to leave message for patient/voice mail was full  Will try again at a later time

## 2017-12-25 NOTE — Telephone Encounter (Signed)
Pt has been scheduled.  °

## 2017-12-31 ENCOUNTER — Encounter: Payer: Self-pay | Admitting: Cardiovascular Disease

## 2017-12-31 ENCOUNTER — Ambulatory Visit (INDEPENDENT_AMBULATORY_CARE_PROVIDER_SITE_OTHER): Payer: Medicare Other | Admitting: Cardiovascular Disease

## 2017-12-31 VITALS — BP 156/78 | HR 83 | Ht 66.0 in | Wt 215.8 lb

## 2017-12-31 DIAGNOSIS — I639 Cerebral infarction, unspecified: Secondary | ICD-10-CM | POA: Diagnosis not present

## 2017-12-31 DIAGNOSIS — Z720 Tobacco use: Secondary | ICD-10-CM

## 2017-12-31 DIAGNOSIS — I10 Essential (primary) hypertension: Secondary | ICD-10-CM | POA: Diagnosis not present

## 2017-12-31 DIAGNOSIS — E785 Hyperlipidemia, unspecified: Secondary | ICD-10-CM

## 2017-12-31 NOTE — Patient Instructions (Addendum)
Medication Instructions: - Your physician recommends that you continue on your current medications as directed. Please refer to the Current Medication list given to you today.  Labwork: - none ordered  Procedures/Testing: - Your physician has recommended that you have a loop recorder (LINQ) monitor implanted by Dr. Virl Axe  - Please arrive at the [facility] entrance at 7:30 am on Thursday 01/02/18 - you may have a light breakfast that morning - you may take all of your regular medications that morning - please wash your chest and neck area with an antibacterial soap (any brand) the night before and morning of your procedure  Follow-Up: - Your physician recommends that you schedule a follow-up appointment in: 10-14 days (from 01/02/18) with the [facility] for a wound check    Any Additional Special Instructions Will Be Listed Below (If Applicable).     If you need a refill on your cardiac medications before your next appointment, please call your pharmacy.

## 2017-12-31 NOTE — Progress Notes (Signed)
Cardiology Office Note   Date:  12/31/2017   ID:  LEIGHA OLBERDING, DOB 21-Sep-1940, MRN [id number]  PCP:  Lavera Guise, MD  Cardiologist:   Kathlyn Sacramento, MD   No chief complaint on file.     History of Present Illness: [name] Douglas is a 77 y.o. female who was referred for cardiac evaluation after recent stroke. She has known history of hypertension, CVA  and carotid disease.  She was hospitalized earlier this month at [facility] with acute generalized weakness and tingling in her hands with dysarthria.  She had initial stroke in early 2018 and was treated with TPA.  She fell in April 2018 and had intracranial bleed that did not require neurosurgical intervention.  Work-up during recent hospitalization revealed right parietal and left centrum ovale stroke.  She was suspected of having an embolic stroke.  She had an echocardiogram done which showed normal LV systolic function with grade 1 diastolic dysfunction, mild aortic regurgitation and mild pulmonary hypertension.  No cardiac source of emboli was identified.  Carotid Doppler showed mild atherosclerosis bilaterally without obstructive disease.  She had a 30-day outpatient monitor done last year by neurology which showed no evidence of atrial fibrillation. The patient has chronic medical conditions that include essential hypertension, hyperlipidemia, tobacco use and COPD.    Past Medical History:  Diagnosis Date  . Anxiety   . Arthritis    "shoulders" (08/20/2014)  . Asthma   . Chronic lower back pain   . Complication of anesthesia    "agitated & restless after knee replacement in 2008"  . COPD (chronic obstructive pulmonary disease) ([place])   . DDD (degenerative disc disease), lumbar   . Depression   . Diverticulosis   . GERD (gastroesophageal reflux disease)   . High cholesterol   . Hypertension   . Hypothyroidism   . On home oxygen therapy    "2L at night" (08/20/2014)  . Shortness of breath dyspnea    with exertion    Past  Surgical History:  Procedure Laterality Date  . CARDIAC CATHETERIZATION  ~ 2007  . EXCISIONAL HEMORRHOIDECTOMY  1960's  . EYE SURGERY Bilateral    Cataract extraction with IOL  . JOINT REPLACEMENT Left 2009  . KNEE ARTHROSCOPY Left X 2 <2008  . KNEE ARTHROSCOPY WITH SUBCHONDROPLASTY Right 04/19/2016   Procedure: KNEE ARTHROSCOPY WITH SUBCHONDROPLASTY, PARTIAL MENISCECTOMY;  Surgeon: Hessie Knows, MD;  Location: [facility] ORS;  Service: Orthopedics;  Laterality: Right;  . SHOULDER OPEN ROTATOR CUFF REPAIR Right 2001  . TONSILLECTOMY  ~ 1950  . TOTAL KNEE ARTHROPLASTY Left 2008  . TOTAL KNEE ARTHROPLASTY Right 07/03/2016   Procedure: TOTAL KNEE ARTHROPLASTY;  Surgeon: Hessie Knows, MD;  Location: [facility] ORS;  Service: Orthopedics;  Laterality: Right;  . TUBAL LIGATION  1973  . VAGINAL HYSTERECTOMY  1974     Current Outpatient Medications  Medication Sig Dispense Refill  . atorvastatin (LIPITOR) 80 MG tablet Take 1 tablet (80 mg total) by mouth daily at 6 PM. 30 tablet 1  . calcium carbonate (OS-CAL) 1250 (500 Ca) MG chewable tablet Chew 1 tablet by mouth daily.     . clopidogrel (PLAVIX) 75 MG tablet Take 1 tablet (75 mg total) by mouth daily. 30 tablet 2  . docusate sodium (COLACE) 100 MG capsule Take 100 mg by mouth at bedtime.    Marland Kitchen levothyroxine (SYNTHROID, LEVOTHROID) 137 MCG tablet Take by mouth daily before breakfast.     . metoprolol succinate (TOPROL-XL) 50 MG  24 hr tablet Take 1 tablet (50 mg total) by mouth daily. Take with or immediately following a meal. 30 tablet 5  . ondansetron (ZOFRAN) 4 MG tablet Take 1 tablet (4 mg total) by mouth every 8 (eight) hours as needed for nausea or vomiting. 30 tablet 0  . venlafaxine XR (EFFEXOR-XR) 75 MG 24 hr capsule Take 3 capsules (225 mg total) by mouth daily with breakfast. 30 capsule 0   No current facility-administered medications for this visit.     Allergies:   Heparin    Social History:  The patient  reports that she has been  smoking cigarettes.  She has a 26.50 pack-year smoking history. She has never used smokeless tobacco. She reports that she does not drink alcohol or use drugs.   Family History:  The patient's family history includes Congestive Heart Failure in her father and sister; Diabetes in her mother; Hypertension in her mother; Lung cancer in her mother.    ROS:  Please see the history of present illness.   Otherwise, review of systems are positive for none.   All other systems are reviewed and negative.    PHYSICAL EXAM: VS:  BP (!) 156/78   Pulse 83   Ht 5\' 6"  (1.676 m)   Wt 215 lb 12 oz (97.9 kg)   SpO2 96%   BMI 34.82 kg/m  , BMI Body mass index is 34.82 kg/m. GEN: Well nourished, well developed, in no acute distress  HEENT: normal  Neck: no JVD, carotid bruits, or masses Cardiac: RRR; no rubs, or gallops,no edema .  1 out of 6 systolic murmur in the aortic area  Respiratory:  clear to auscultation bilaterally, normal work of breathing GI: soft, nontender, nondistended, + BS MS: no deformity or atrophy  Skin: warm and dry, no rash Neuro:  Strength and sensation are intact Psych: euthymic mood, full affect   EKG:  EKG is not ordered today.   Recent Labs: 12/20/2017: BUN 11; Creatinine, Ser 0.89; Hemoglobin 11.5; Platelets 294; Potassium 3.3; Sodium 139    Lipid Panel    Component Value Date/Time   CHOL 218 (H) 12/21/2017 0449   TRIG 185 (H) 12/21/2017 0449   HDL 52 12/21/2017 0449   CHOLHDL 4.2 12/21/2017 0449   VLDL 37 12/21/2017 0449   LDLCALC 129 (H) 12/21/2017 0449      Wt Readings from Last 3 Encounters:  12/31/17 215 lb 12 oz (97.9 kg)  12/20/17 218 lb (98.9 kg)  11/04/17 218 lb 0.3 oz (98.9 kg)       No flowsheet data found.    ASSESSMENT AND PLAN:  1.  Recurrent strokes: Suggestive of an embolic etiology of unclear source.  Echocardiogram was overall unremarkable.  Carotid Doppler showed mild nonobstructive disease.  30-day outpatient monitor last year  showed no evidence of atrial fibrillation.  She had a recent stroke in spite of taking Plavix which raises the possibility of underlying paroxysmal atrial fibrillation as an etiology.  I think the best option is to proceed with a loop recorder placement to evaluate this possibility.  I discussed the procedure in details with the patient and daughter.  We will ask Dr. Caryl Comes to perform.  2.  Essential hypertension: Blood pressure is mildly elevated today: Consider adding an ACE inhibitor or ARB.  3.  Hyperlipidemia: Continue treatment with atorvastatin with a target LDL of less than 70.  4.  Tobacco use: I discussed with her the importance of smoking cessation.    Disposition:  FU with device clinic after loop recorder placement.  Signed,  Kathlyn Sacramento, MD  12/31/2017 5:35 PM    [place]

## 2018-01-02 ENCOUNTER — Ambulatory Visit
Admission: RE | Admit: 2018-01-02 | Discharge: 2018-01-02 | Disposition: A | Discharge status: Home / Self Care | Payer: Medicare Other | Source: Ambulatory Visit | Attending: Internal Medicine | Admitting: Internal Medicine

## 2018-01-02 ENCOUNTER — Encounter
Admission: RE | Disposition: A | Discharge status: Home / Self Care | Payer: Self-pay | Source: Ambulatory Visit | Attending: Internal Medicine

## 2018-01-02 ENCOUNTER — Ambulatory Visit: Admit: 2018-01-02 | Payer: Medicare Other | Admitting: Internal Medicine

## 2018-01-02 DIAGNOSIS — E039 Hypothyroidism, unspecified: Secondary | ICD-10-CM | POA: Insufficient documentation

## 2018-01-02 DIAGNOSIS — Z8249 Family history of ischemic heart disease and other diseases of the circulatory system: Secondary | ICD-10-CM | POA: Insufficient documentation

## 2018-01-02 DIAGNOSIS — Z9981 Dependence on supplemental oxygen: Secondary | ICD-10-CM | POA: Insufficient documentation

## 2018-01-02 DIAGNOSIS — Z7902 Long term (current) use of antithrombotics/antiplatelets: Secondary | ICD-10-CM | POA: Insufficient documentation

## 2018-01-02 DIAGNOSIS — I10 Essential (primary) hypertension: Secondary | ICD-10-CM | POA: Insufficient documentation

## 2018-01-02 DIAGNOSIS — G8929 Other chronic pain: Secondary | ICD-10-CM | POA: Diagnosis not present

## 2018-01-02 DIAGNOSIS — F17210 Nicotine dependence, cigarettes, uncomplicated: Secondary | ICD-10-CM | POA: Insufficient documentation

## 2018-01-02 DIAGNOSIS — F419 Anxiety disorder, unspecified: Secondary | ICD-10-CM | POA: Diagnosis not present

## 2018-01-02 DIAGNOSIS — F329 Major depressive disorder, single episode, unspecified: Secondary | ICD-10-CM | POA: Insufficient documentation

## 2018-01-02 DIAGNOSIS — E7800 Pure hypercholesterolemia, unspecified: Secondary | ICD-10-CM | POA: Diagnosis not present

## 2018-01-02 DIAGNOSIS — J449 Chronic obstructive pulmonary disease, unspecified: Secondary | ICD-10-CM | POA: Diagnosis not present

## 2018-01-02 DIAGNOSIS — M5136 Other intervertebral disc degeneration, lumbar region: Secondary | ICD-10-CM | POA: Insufficient documentation

## 2018-01-02 DIAGNOSIS — I6310 Cerebral infarction due to embolism of unspecified precerebral artery: Secondary | ICD-10-CM | POA: Insufficient documentation

## 2018-01-02 DIAGNOSIS — K219 Gastro-esophageal reflux disease without esophagitis: Secondary | ICD-10-CM | POA: Insufficient documentation

## 2018-01-02 DIAGNOSIS — I639 Cerebral infarction, unspecified: Secondary | ICD-10-CM | POA: Diagnosis not present

## 2018-01-02 DIAGNOSIS — G459 Transient cerebral ischemic attack, unspecified: Secondary | ICD-10-CM | POA: Diagnosis present

## 2018-01-02 HISTORY — PX: LOOP RECORDER INSERTION: EP1214

## 2018-01-02 SURGERY — LOOP RECORDER INSERTION
Anesthesia: LOCAL

## 2018-01-02 SURGICAL SUPPLY — 2 items
LOOP REVEAL LINQSYS (Prosthesis & Implant Heart) ×2 IMPLANT
PACK LOOP INSERTION (CUSTOM PROCEDURE TRAY) ×2 IMPLANT

## 2018-01-02 NOTE — H&P (Signed)
ELECTROPHYSIOLOGY CONSULT NOTE  Patient ID: [name] Douglas MRN: [id number], DOB/AGE: 78-Jul-1942   Admit date: 01/02/2018 Date of Consult: 01/02/2018  Primary Physician: Lavera Guise, MD Primary Cardiologist: MA Reason for Consultation: Cryptogenic stroke; recommendations regarding Implantable Loop Recorder  History of Present Illness  EP has been asked to evaluate [name] Douglas for placement of an implantable loop recorder to monitor for atrial fibrillation by Dr Fletcher Anon.  The patient was admitted on 12/19/2017 with CVA-- bilateral.  They first developed symptoms while at a gas station.  Imaging demonstrated Large R parietalstroke and small left centrum stroke .  she has undergone workup for stroke including echocardiogram and carotid dopplers.  The patient has been monitored on telemetry which has demonstrated sinus rhythm with no arrhythmias.   Remote hx of CVA and traumatic iintracranial hemorrhage   Echocardiogram this admission demonstrated normal LV functiona nd LA size .  Lab work is reviewed.   discharged on plavix     Past Medical History:  Diagnosis Date  . Anxiety   . Arthritis    "shoulders" (08/20/2014)  . Asthma   . Chronic lower back pain   . Complication of anesthesia    "agitated & restless after knee replacement in 2008"  . COPD (chronic obstructive pulmonary disease) ([place])   . DDD (degenerative disc disease), lumbar   . Depression   . Diverticulosis   . GERD (gastroesophageal reflux disease)   . High cholesterol   . Hypertension   . Hypothyroidism   . On home oxygen therapy    "2L at night" (08/20/2014)  . Shortness of breath dyspnea    with exertion     Surgical History:  Past Surgical History:  Procedure Laterality Date  . CARDIAC CATHETERIZATION  ~ 2007  . EXCISIONAL HEMORRHOIDECTOMY  1960's  . EYE SURGERY Bilateral    Cataract extraction with IOL  . JOINT REPLACEMENT Left 2009  . KNEE ARTHROSCOPY Left X 2 <2008  . KNEE ARTHROSCOPY WITH  SUBCHONDROPLASTY Right 04/19/2016   Procedure: KNEE ARTHROSCOPY WITH SUBCHONDROPLASTY, PARTIAL MENISCECTOMY;  Surgeon: Hessie Knows, MD;  Location: [facility] ORS;  Service: Orthopedics;  Laterality: Right;  . SHOULDER OPEN ROTATOR CUFF REPAIR Right 2001  . TONSILLECTOMY  ~ 1950  . TOTAL KNEE ARTHROPLASTY Left 2008  . TOTAL KNEE ARTHROPLASTY Right 07/03/2016   Procedure: TOTAL KNEE ARTHROPLASTY;  Surgeon: Hessie Knows, MD;  Location: [facility] ORS;  Service: Orthopedics;  Laterality: Right;  . TUBAL LIGATION  1973  . VAGINAL HYSTERECTOMY  1974     Medications Prior to Admission  Medication Sig Dispense Refill Last Dose  . atorvastatin (LIPITOR) 80 MG tablet Take 1 tablet (80 mg total) by mouth daily at 6 PM. 30 tablet 1 01/01/2018 at Unknown time  . calcium carbonate (OS-CAL) 1250 (500 Ca) MG chewable tablet Chew 1 tablet by mouth daily.    01/01/2018 at Unknown time  . clopidogrel (PLAVIX) 75 MG tablet Take 1 tablet (75 mg total) by mouth daily. 30 tablet 2 01/01/2018 at Unknown time  . docusate sodium (COLACE) 100 MG capsule Take 100 mg by mouth at bedtime.   01/01/2018 at Unknown time  . levothyroxine (SYNTHROID, LEVOTHROID) 137 MCG tablet Take by mouth daily before breakfast.    01/01/2018 at Unknown time  . metoprolol succinate (TOPROL-XL) 50 MG 24 hr tablet Take 1 tablet (50 mg total) by mouth daily. Take with or immediately following a meal. 30 tablet 5 01/01/2018 at Unknown time  . ondansetron (ZOFRAN)  4 MG tablet Take 1 tablet (4 mg total) by mouth every 8 (eight) hours as needed for nausea or vomiting. 30 tablet 0 Past Month at Unknown time  . venlafaxine XR (EFFEXOR-XR) 75 MG 24 hr capsule Take 3 capsules (225 mg total) by mouth daily with breakfast. 30 capsule 0 01/01/2018 at Unknown time     Allergies:  Allergies  Allergen Reactions  . Heparin Nausea And Vomiting    Social History   Socioeconomic History  . Marital status: Divorced    Spouse name: Not on file  . Number of children: Not on  file  . Years of education: Not on file  . Highest education level: Not on file  Occupational History  . Not on file  Social Needs  . Financial resource strain: Not on file  . Food insecurity:    Worry: Not on file    Inability: Not on file  . Transportation needs:    Medical: Not on file    Non-medical: Not on file  Tobacco Use  . Smoking status: Current Every Day Smoker    Packs/day: 0.50    Years: 53.00    Pack years: 26.50    Types: Cigarettes  . Smokeless tobacco: Never Used  Substance and Sexual Activity  . Alcohol use: No  . Drug use: No  . Sexual activity: Never  Lifestyle  . Physical activity:    Days per week: Not on file    Minutes per session: Not on file  . Stress: Not on file  Relationships  . Social connections:    Talks on phone: Not on file    Gets together: Not on file    Attends religious service: Not on file    Active member of club or organization: Not on file    Attends meetings of clubs or organizations: Not on file    Relationship status: Not on file  . Intimate partner violence:    Fear of current or ex partner: Not on file    Emotionally abused: Not on file    Physically abused: Not on file    Forced sexual activity: Not on file  Other Topics Concern  . Not on file  Social History Narrative  . Not on file     Family History  Problem Relation Age of Onset  . Diabetes Mother   . Hypertension Mother   . Lung cancer Mother   . Congestive Heart Failure Father   . Congestive Heart Failure Sister       Review of Systems: All other systems reviewed and are otherwise negative except as noted above.  Physical Exam: Vitals:   01/02/18 0738  BP: (!) 177/67  Pulse: 85  Resp: 20  Temp: 98 F (36.7 C)  TempSrc: Oral  SpO2: 92%  Weight: 215 lb (97.5 kg)  Height: 5\' 6"  (1.676 m)    Well developed and nourished in no acute distress HENT normal Neck supple with JVP-flat Clear Regular rate and rhythm, no murmurs or gallops Abd-soft  with active BS No Clubbing cyanosis edema Skin-warm and dry A & Oriented  Grossly normal sensory and motor function   Labs:   Lab Results  Component Value Date   WBC 8.3 12/20/2017   HGB 11.5 (L) 12/20/2017   HCT 34.5 (L) 12/20/2017   MCV 79.2 (L) 12/20/2017   PLT 294 12/20/2017   No results for input(s): NA, K, CL, CO2, BUN, CREATININE, CALCIUM, PROT, BILITOT, ALKPHOS, ALT, AST, GLUCOSE in the last  168 hours.  Invalid input(s): LABALBU   Radiology/Studies: Ct Head Wo Contrast  Result Date: 12/20/2017 CLINICAL DATA:  Acute onset hand numbness and altered mental status EXAM: CT HEAD WITHOUT CONTRAST TECHNIQUE: Contiguous axial images were obtained from the base of the skull through the vertex without intravenous contrast. COMPARISON:  September 25, 2017 and December 08, 2016 FINDINGS: Brain: The ventricles are normal in size and configuration. There is a focal area of decreased attenuation in the right parietal lobe with what is felt to represent cytotoxic edema with localized sulcal effacement spanning an area measuring 4.3 x 3.8 x 3.1 cm. This area is concerning for acute infarct in the right parietal lobe involving a branch of the right middle cerebral artery. No similar changes are seen elsewhere. There is mild patchy periventricular small vessel disease in the centra semiovale bilaterally. There is no extra-axial fluid collection or midline shift. Vascular: No evident hyperdense vessel. There is calcification in each carotid siphon region. Skull: The bony calvarium appears intact. Sinuses/Orbits: There is mucosal thickening in several ethmoid air cells. Other visualized paranasal sinuses are clear. Frontal sinuses are aplastic. Visualized orbits appear symmetric bilaterally. Other: Mastoid air cells are clear. There is debris in the left external auditory canal. IMPRESSION: 1. Localized edema and mass effect in right parietal lobe. Suspect acute infarct, particularly given the clinical history. An  underlying mass or abscess with edema is a differential consideration. It may be prudent to correlate with brain MR pre and post-contrast to exclude a lesion other than acute infarct. 2.  Slight periventricular small vessel disease. 3.  No hemorrhage or extra-axial fluid collection. 4.  Foci of arterial vascular calcification bilaterally. 5.  Mucosal thickening in several ethmoid air cells. Electronically Signed   By: Lowella Grip III M.D.   On: 12/20/2017 11:41   Mr Brain W And Wo Contrast  Result Date: 12/20/2017 CLINICAL DATA:  Focal deficit.  Stroke suspected EXAM: MRI HEAD WITHOUT AND WITH CONTRAST TECHNIQUE: Multiplanar, multiecho pulse sequences of the brain and surrounding structures were obtained without and with intravenous contrast. CONTRAST:  45mL MULTIHANCE GADOBENATE DIMEGLUMINE 529 MG/ML IV SOLN COMPARISON:  Head CT from earlier today.  Brain MRI 08/23/2016 FINDINGS: Brain: Large area of acute infarct centered in the right parietal lobe, extending into the posterior temporal cortex. Subcentimeter acute infarct in the left centrum semiovale. Negative for hemorrhagic conversion. Mild chronic small vessel ischemia in the periventricular white matter and pons. No hydrocephalus or collection. No abnormal intracranial enhancement. Vascular: Major intracranial flow voids are tortuous but preserved. Skull and upper cervical spine: No evidence of marrow lesion. Cervical facet arthropathy focally on the right at C3-4 with anterolisthesis. Sinuses/Orbits: Bilateral cataract resection. IMPRESSION: 1. Large acute infarct centered in the right parietal lobe. 2. Small acute infarct in the left centrum semiovale. 3. Moderate chronic small vessel ischemia in the cerebral white matter and pons. Electronically Signed   By: Monte Fantasia M.D.   On: 12/20/2017 14:00   US Carotid Bilateral (at [facility] Only)  Result Date: 12/20/2017 CLINICAL DATA:  77 year old female with a history of cerebrovascular  accident Cardiovascular risk factors include hypertension, stroke/TIA, hyperlipidemia, tobacco use EXAM: BILATERAL CAROTID DUPLEX ULTRASOUND TECHNIQUE: Pearline Cables scale imaging, color Doppler and duplex ultrasound were performed of bilateral carotid and vertebral arteries in the neck. COMPARISON:  None. FINDINGS: Criteria: Quantification of carotid stenosis is based on velocity parameters that correlate the residual internal carotid diameter with NASCET-based stenosis levels, using the diameter of the distal  internal carotid lumen as the denominator for stenosis measurement. The following velocity measurements were obtained: RIGHT ICA:  Systolic 89 cm/sec, Diastolic 7 cm/sec CCA:  706 cm/sec SYSTOLIC ICA/CCA RATIO:  0.8 ECA:  217 cm/sec LEFT ICA:  Systolic 237 cm/sec, Diastolic 16 cm/sec CCA:  628 cm/sec SYSTOLIC ICA/CCA RATIO:  0.9 ECA:  149 cm/sec Right Brachial SBP: Not acquired Left Brachial SBP: Not acquired RIGHT CAROTID ARTERY: No significant calcifications of the right common carotid artery. Intermediate waveform maintained. Heterogeneous and partially calcified plaque at the right carotid bifurcation. No significant lumen shadowing. Low resistance waveform of the right ICA. No significant tortuosity. RIGHT VERTEBRAL ARTERY: Antegrade flow with low resistance waveform. LEFT CAROTID ARTERY: No significant calcifications of the left common carotid artery. Intermediate waveform maintained. Heterogeneous and partially calcified plaque at the left carotid bifurcation without significant lumen shadowing. Low resistance waveform of the left ICA. No significant tortuosity. LEFT VERTEBRAL ARTERY:  Antegrade flow with low resistance waveform. IMPRESSION: Color duplex indicates minimal heterogeneous and calcified plaque, with no hemodynamically significant stenosis by duplex criteria in the extracranial cerebrovascular circulation. Signed, Dulcy Fanny. Earleen Newport, DO [facility]  Radiology Electronically Signed   By: Corrie Mckusick D.O.   On: 12/20/2017 16:09    12-lead ECG  sinsu  (personally reviewed) All prior EKG's in EPIC reviewed with no documented atrial fibrillation  Telemetry  From hospitalization >> NSR (personally reviewed)  Assessment and Plan:  1. Cryptogenic stroke The patient presents with cryptogenic stroke.  T  spoke at length with the patient about monitoring for afib with an implantable loop recorder.  Risks, benefits, and alteratives to implantable loop recorder were discussed with the patient today.   At this time, the patient is very clear in their decision to proceed with implantable loop recorder.   Wound care was reviewed with the patient (keep incision clean and dry for 3 days).  Wound check scheduled and entered in AVS.  Please call with questions.   Virl Axe, MD 01/02/2018 8:41 AM

## 2018-01-03 ENCOUNTER — Other Ambulatory Visit: Payer: Self-pay

## 2018-01-06 ENCOUNTER — Encounter: Payer: Self-pay | Admitting: Internal Medicine

## 2018-01-06 ENCOUNTER — Ambulatory Visit (INDEPENDENT_AMBULATORY_CARE_PROVIDER_SITE_OTHER): Payer: Medicare Other | Admitting: Nurse Practitioner

## 2018-01-06 ENCOUNTER — Ambulatory Visit: Payer: Medicare Other

## 2018-01-06 VITALS — BP 132/60 | HR 83 | Resp 16 | Ht 66.0 in | Wt 218.4 lb

## 2018-01-06 DIAGNOSIS — I639 Cerebral infarction, unspecified: Secondary | ICD-10-CM | POA: Diagnosis not present

## 2018-01-06 DIAGNOSIS — I6523 Occlusion and stenosis of bilateral carotid arteries: Secondary | ICD-10-CM

## 2018-01-06 DIAGNOSIS — I10 Essential (primary) hypertension: Secondary | ICD-10-CM

## 2018-01-06 DIAGNOSIS — E039 Hypothyroidism, unspecified: Secondary | ICD-10-CM

## 2018-01-06 NOTE — Progress Notes (Signed)
[facility] [place], [place] [address]  Internal MEDICINE  Office Visit Note  Patient Name: [name] Douglas  [id number]  [id number]  Date of Service: 01/29/2018   Pt is here for routine follow up.   Chief Complaint  Patient presents with  . Follow-up    had a stroke in May    They patient was recently hospitalized for having two TIA. One right temporal region and one left occipital region. She had sudden onset of heaviness and numbness in the arms and hands. Was confused. Was taken to [facility] via ambulance. Carotid doppler did show diffuse plaque in the carotid arteries without evidence of stenosis. Echocardiogram looked good. She has followed up with cardiology. Has implantable heart monitor to evaluate for abnormalities which may have led to the thrombolytic stroke. She does continue to have moderate to severe headaches which come on suddenly and are very sharp. Her daughter states that the patient continues to have some mild confusion. The patient states that she feels well overall.       Current Medication: Outpatient Encounter Medications as of 01/06/2018  Medication Sig  . atorvastatin (LIPITOR) 80 MG tablet Take 1 tablet (80 mg total) by mouth daily at 6 PM.  . calcium carbonate (OS-CAL) 1250 (500 Ca) MG chewable tablet Chew 1 tablet by mouth daily.   Marland Kitchen docusate sodium (COLACE) 100 MG capsule Take 100 mg by mouth at bedtime.  Marland Kitchen levothyroxine (SYNTHROID, LEVOTHROID) 137 MCG tablet Take by mouth daily before breakfast.   . metoprolol succinate (TOPROL-XL) 50 MG 24 hr tablet Take 1 tablet (50 mg total) by mouth daily. Take with or immediately following a meal.  . ondansetron (ZOFRAN) 4 MG tablet Take 1 tablet (4 mg total) by mouth every 8 (eight) hours as needed for nausea or vomiting.  . venlafaxine XR (EFFEXOR-XR) 75 MG 24 hr capsule Take 3 capsules (225 mg total) by mouth daily with breakfast.  . [DISCONTINUED] clopidogrel (PLAVIX) 75 MG tablet Take 1  tablet (75 mg total) by mouth daily.   No facility-administered encounter medications on file as of 01/06/2018.     Surgical History: Past Surgical History:  Procedure Laterality Date  . CARDIAC CATHETERIZATION  ~ 2007  . EXCISIONAL HEMORRHOIDECTOMY  1960's  . EYE SURGERY Bilateral    Cataract extraction with IOL  . JOINT REPLACEMENT Left 2009  . KNEE ARTHROSCOPY Left X 2 <2008  . KNEE ARTHROSCOPY WITH SUBCHONDROPLASTY Right 04/19/2016   Procedure: KNEE ARTHROSCOPY WITH SUBCHONDROPLASTY, PARTIAL MENISCECTOMY;  Surgeon: Hessie Knows, MD;  Location: [facility] ORS;  Service: Orthopedics;  Laterality: Right;  . LOOP RECORDER INSERTION N/A 01/02/2018   Procedure: LOOP RECORDER INSERTION;  Surgeon: Deboraha Sprang, MD;  Location: [place] [facility];  Service: Cardiovascular;  Laterality: N/A;  . SHOULDER OPEN ROTATOR CUFF REPAIR Right 2001  . TONSILLECTOMY  ~ 1950  . TOTAL KNEE ARTHROPLASTY Left 2008  . TOTAL KNEE ARTHROPLASTY Right 07/03/2016   Procedure: TOTAL KNEE ARTHROPLASTY;  Surgeon: Hessie Knows, MD;  Location: [facility] ORS;  Service: Orthopedics;  Laterality: Right;  . TUBAL LIGATION  1973  . VAGINAL HYSTERECTOMY  1974    Medical History: Past Medical History:  Diagnosis Date  . Anxiety   . Arthritis    "shoulders" (08/20/2014)  . Asthma   . Chronic lower back pain   . Complication of anesthesia    "agitated & restless after knee replacement in 2008"  . COPD (chronic obstructive pulmonary disease) ([place])   .  DDD (degenerative disc disease), lumbar   . Depression   . Diverticulosis   . GERD (gastroesophageal reflux disease)   . High cholesterol   . Hypertension   . Hypothyroidism   . On home oxygen therapy    "2L at night" (08/20/2014)  . Shortness of breath dyspnea    with exertion  . Stroke [facility])     Family History: Family History  Problem Relation Age of Onset  . Diabetes Mother   . Hypertension Mother   . Lung cancer Mother   . Congestive Heart Failure Father   .  Congestive Heart Failure Sister     Social History   Socioeconomic History  . Marital status: Divorced    Spouse name: Not on file  . Number of children: Not on file  . Years of education: Not on file  . Highest education level: Not on file  Occupational History  . Not on file  Social Needs  . Financial resource strain: Not on file  . Food insecurity:    Worry: Not on file    Inability: Not on file  . Transportation needs:    Medical: Not on file    Non-medical: Not on file  Tobacco Use  . Smoking status: Current Every Day Smoker    Packs/day: 0.50    Years: 53.00    Pack years: 26.50    Types: Cigarettes  . Smokeless tobacco: Never Used  Substance and Sexual Activity  . Alcohol use: No  . Drug use: No  . Sexual activity: Never  Lifestyle  . Physical activity:    Days per week: Not on file    Minutes per session: Not on file  . Stress: Not on file  Relationships  . Social connections:    Talks on phone: Not on file    Gets together: Not on file    Attends religious service: Not on file    Active member of club or organization: Not on file    Attends meetings of clubs or organizations: Not on file    Relationship status: Not on file  . Intimate partner violence:    Fear of current or ex partner: Not on file    Emotionally abused: Not on file    Physically abused: Not on file    Forced sexual activity: Not on file  Other Topics Concern  . Not on file  Social History Narrative  . Not on file      Review of Systems  Constitutional: Positive for fatigue. Negative for unexpected weight change.  HENT: Negative for congestion, postnasal drip and sneezing.   Eyes: Negative.   Respiratory: Negative for cough, chest tightness, shortness of breath and wheezing.   Cardiovascular: Negative for chest pain, palpitations and leg swelling.       Blood pressures very elevated today  Gastrointestinal: Negative for nausea and vomiting.  Endocrine:       Needs to have  thyroid levels checked again since dose levothyroxine increased .  Genitourinary: Negative.   Musculoskeletal: Positive for arthralgias, back pain and myalgias.       Intermittent low back pain. Sometimes has arthritic pain in her knees.   Skin: Negative for rash.  Allergic/Immunologic: Positive for environmental allergies.  Neurological: Positive for weakness, light-headedness and headaches.       Patient and her daughte both report some memory deficit since suffering stroke in May. Gradually improving.   Hematological: Negative for adenopathy.  Psychiatric/Behavioral: Negative for agitation, confusion, dysphoric mood  and sleep disturbance. The patient is nervous/anxious.    Today's Vitals   01/06/18 1432  BP: 132/60  Pulse: 83  Resp: 16  SpO2: 96%  Weight: 218 lb 6.4 oz (99.1 kg)  Height: 5\' 6"  (1.676 m)    Physical Exam  Constitutional: She is oriented to person, place, and time. She appears well-developed and well-nourished.  HENT:  Head: Normocephalic and atraumatic.  Nose: Nose normal.  Eyes: Pupils are equal, round, and reactive to light. Conjunctivae and EOM are normal.  Neck: Normal range of motion. Neck supple. No JVD present. Carotid bruit is not present. No tracheal deviation present. No thyromegaly present.  Cardiovascular: Normal rate, regular rhythm and normal heart sounds.  Mild tachycardia  Pulmonary/Chest: Effort normal and breath sounds normal. She has no wheezes.  Abdominal: Soft. Bowel sounds are normal. There is no tenderness.  Musculoskeletal: Normal range of motion.  Lymphadenopathy:    She has no cervical adenopathy.  Neurological: She is alert and oriented to person, place, and time. No cranial nerve deficit. She exhibits normal muscle tone. Coordination normal.  Skin: Skin is warm and dry. Capillary refill takes less than 2 seconds.  Psychiatric: She has a normal mood and affect. Her behavior is normal. Judgment and thought content normal.  Nursing  note and vitals reviewed.  Assessment/Plan: 1. Acute CVA (cerebrovascular accident) [facility]) Reviewed hospital records and imaging from recent hospitalization. MRI of the brain did show large acute infarct right pariatal lobe and small acute infarct of left centrum semiovale. She is now on plavix and seeing a cardiologist to evaluate for cardiac arrhythmia.   2. Occlusion and stenosis of bilateral carotid arteries Carotid doppler done in hospital. There is minimal plaque in both right and left carotid bifurcations. There is no stenosis, bilaterally.   3. Essential hypertension Currently stable. Continue bp medication as prescribed.   4. Acquired hypothyroidism Will have thyroid panel checked. Levothyroxine to be adjusted as indicated.   General Counseling: [name] Douglas understanding of the findings of todays visit and agrees with plan of treatment. I have discussed any further diagnostic evaluation that may be needed or ordered today. We also reviewed her medications today. she has been encouraged to call the office with any questions or concerns that should arise related to todays visit.    Counseling:  This patient was seen by Leretha Pol, FNP- C in Collaboration with Dr Lavera Guise as a part of collaborative care agreement    Time spent: 25 Minutes   Dr Lavera Guise Internal medicine

## 2018-01-14 ENCOUNTER — Other Ambulatory Visit: Payer: Self-pay | Admitting: Internal Medicine

## 2018-01-15 ENCOUNTER — Telehealth: Payer: Self-pay | Admitting: Cardiology

## 2018-01-15 NOTE — Telephone Encounter (Signed)
Spoke w/ pt and requested that she send a manual transmission b/c her home monitor has not updated in at least 14 days.   

## 2018-01-16 ENCOUNTER — Ambulatory Visit (INDEPENDENT_AMBULATORY_CARE_PROVIDER_SITE_OTHER): Payer: Self-pay | Admitting: *Deleted

## 2018-01-16 DIAGNOSIS — I639 Cerebral infarction, unspecified: Secondary | ICD-10-CM

## 2018-01-16 LAB — CUP PACEART INCLINIC DEVICE CHECK
Date Time Interrogation Session: 20190530092102
Implantable Pulse Generator Implant Date: 20190516

## 2018-01-16 NOTE — Patient Instructions (Signed)
Loop check in clinic. Battery status: good. R-waves 0.61mV. 0 symptom episodes, 0 tachy episodes, 0 pause episodes, 0 brady episodes. 0 AF episodes. Monthly summary reports and ROV with SK PRN

## 2018-01-28 ENCOUNTER — Telehealth: Payer: Self-pay

## 2018-01-28 NOTE — Telephone Encounter (Signed)
LMOVM requesting that pt send manual transmission b/c home monitor has not updated in at least 14 days.   Spoke w/ pt and requested that she send a manual transmission b/c her home monitor has not updated in at least 14 days.   

## 2018-02-03 ENCOUNTER — Ambulatory Visit (INDEPENDENT_AMBULATORY_CARE_PROVIDER_SITE_OTHER): Payer: Medicare Other | Admitting: *Deleted

## 2018-02-03 DIAGNOSIS — I639 Cerebral infarction, unspecified: Secondary | ICD-10-CM

## 2018-02-03 NOTE — Progress Notes (Signed)
Carelink Summary Report / Loop Recorder 

## 2018-02-10 ENCOUNTER — Telehealth: Payer: Self-pay

## 2018-02-10 ENCOUNTER — Other Ambulatory Visit: Payer: Self-pay

## 2018-02-10 NOTE — Telephone Encounter (Signed)
Looks like she has been hospitalized two times since the last time I saw her, both for stroke. Was she taken off of these medications? She should be seen sooner, but she should have hospital follow up for multiple strokes with Dr. Humphrey Rolls.

## 2018-02-10 NOTE — Telephone Encounter (Signed)
Spoke with pt daughter Larene Beach and make appt with Dr Humphrey Rolls

## 2018-02-12 ENCOUNTER — Encounter: Payer: Self-pay | Admitting: Internal Medicine

## 2018-02-12 ENCOUNTER — Telehealth: Payer: Self-pay

## 2018-02-12 ENCOUNTER — Ambulatory Visit (INDEPENDENT_AMBULATORY_CARE_PROVIDER_SITE_OTHER): Payer: Medicare Other | Admitting: Internal Medicine

## 2018-02-12 VITALS — BP 122/60 | HR 80 | Resp 16 | Ht 66.0 in | Wt 219.0 lb

## 2018-02-12 DIAGNOSIS — M6281 Muscle weakness (generalized): Secondary | ICD-10-CM

## 2018-02-12 DIAGNOSIS — I639 Cerebral infarction, unspecified: Secondary | ICD-10-CM

## 2018-02-12 DIAGNOSIS — E785 Hyperlipidemia, unspecified: Secondary | ICD-10-CM | POA: Diagnosis not present

## 2018-02-12 DIAGNOSIS — I10 Essential (primary) hypertension: Secondary | ICD-10-CM

## 2018-02-12 DIAGNOSIS — M1711 Unilateral primary osteoarthritis, right knee: Secondary | ICD-10-CM

## 2018-02-12 DIAGNOSIS — I69311 Memory deficit following cerebral infarction: Secondary | ICD-10-CM | POA: Diagnosis not present

## 2018-02-12 DIAGNOSIS — E538 Deficiency of other specified B group vitamins: Secondary | ICD-10-CM

## 2018-02-12 DIAGNOSIS — E039 Hypothyroidism, unspecified: Secondary | ICD-10-CM | POA: Diagnosis not present

## 2018-02-12 NOTE — Progress Notes (Signed)
[facility] [place], [place] [address]  Internal MEDICINE  Office Visit Note  Patient Name: [name] Douglas  [id number]  [id number]  Date of Service: 02/27/2018  Chief Complaint  Patient presents with  . Follow-up    Pt only been taken two of her prescribed medications.    HPI  Pt is here for routine f/u, daughter in the room, pt has stopped taking all medications, daughter thinks she is confused about her meds.. They patient was recently hospitalized for having two TIA. One right temporal region and one left occipital region. She had sudden onset of heaviness and numbness in the arms and hands. Was confused. Was taken to [facility] via ambulance. Carotid doppler did show diffuse plaque in the carotid arteries without evidence of stenosis. Echocardiogram looked good. She has followed up with cardiology. Has implantable heart monitor to evaluate for abnormalities which may have led to the thrombolytic stroke. She does continue to have moderate to severe headaches which come on suddenly and are very sharp. Her daughter states that the patient continues to have some mild confusion. The patient states that she feels well overall  Current Medication: Outpatient Encounter Medications as of 02/12/2018  Medication Sig  . acetaminophen (TYLENOL) 500 MG tablet Take 500 mg by mouth daily as needed.  . Aspirin-Acetaminophen-Caffeine (GOODYS EXTRA STRENGTH) 520-260-32.5 MG PACK Take by mouth.  . clopidogrel (PLAVIX) 75 MG tablet TAKE ONE TABLET BY MOUTH EVERY DAY  . diphenhydrAMINE HCl, Sleep, (SLEEP AID) 50 MG CAPS Take by mouth.  . metoprolol succinate (TOPROL-XL) 50 MG 24 hr tablet Take 1 tablet (50 mg total) by mouth daily. Take with or immediately following a meal.  . ranitidine (RANITIDINE 150 MAX STRENGTH) 150 MG tablet Take 150 mg by mouth daily as needed for heartburn.  . calcium carbonate (OS-CAL) 1250 (500 Ca) MG chewable tablet Chew 1 tablet by mouth daily.   Marland Kitchen  docusate sodium (COLACE) 100 MG capsule Take 100 mg by mouth at bedtime.  . ondansetron (ZOFRAN) 4 MG tablet Take 1 tablet (4 mg total) by mouth every 8 (eight) hours as needed for nausea or vomiting.  . venlafaxine XR (EFFEXOR-XR) 75 MG 24 hr capsule Take 3 capsules (225 mg total) by mouth daily with breakfast.  . [DISCONTINUED] atorvastatin (LIPITOR) 80 MG tablet Take 1 tablet (80 mg total) by mouth daily at 6 PM.  . [DISCONTINUED] levothyroxine (SYNTHROID, LEVOTHROID) 137 MCG tablet Take by mouth daily before breakfast.    No facility-administered encounter medications on file as of 02/12/2018.     Surgical History: Past Surgical History:  Procedure Laterality Date  . CARDIAC CATHETERIZATION  ~ 2007  . EXCISIONAL HEMORRHOIDECTOMY  1960's  . EYE SURGERY Bilateral    Cataract extraction with IOL  . JOINT REPLACEMENT Left 2009  . KNEE ARTHROSCOPY Left X 2 <2008  . KNEE ARTHROSCOPY WITH SUBCHONDROPLASTY Right 04/19/2016   Procedure: KNEE ARTHROSCOPY WITH SUBCHONDROPLASTY, PARTIAL MENISCECTOMY;  Surgeon: Hessie Knows, MD;  Location: [facility] ORS;  Service: Orthopedics;  Laterality: Right;  . LOOP RECORDER INSERTION N/A 01/02/2018   Procedure: LOOP RECORDER INSERTION;  Surgeon: Deboraha Sprang, MD;  Location: [place] [facility];  Service: Cardiovascular;  Laterality: N/A;  . SHOULDER OPEN ROTATOR CUFF REPAIR Right 2001  . TONSILLECTOMY  ~ 1950  . TOTAL KNEE ARTHROPLASTY Left 2008  . TOTAL KNEE ARTHROPLASTY Right 07/03/2016   Procedure: TOTAL KNEE ARTHROPLASTY;  Surgeon: Hessie Knows, MD;  Location: [facility] ORS;  Service: Orthopedics;  Laterality: Right;  . TUBAL LIGATION  1973  . VAGINAL HYSTERECTOMY  1974    Medical History: Past Medical History:  Diagnosis Date  . Anxiety   . Arthritis    "shoulders" (08/20/2014)  . Asthma   . Chronic lower back pain   . Complication of anesthesia    "agitated & restless after knee replacement in 2008"  . COPD (chronic obstructive pulmonary disease)  ([place])   . DDD (degenerative disc disease), lumbar   . Depression   . Diverticulosis   . GERD (gastroesophageal reflux disease)   . High cholesterol   . Hypertension   . Hypothyroidism   . On home oxygen therapy    "2L at night" (08/20/2014)  . Shortness of breath dyspnea    with exertion  . Stroke [facility])     Family History: Family History  Problem Relation Age of Onset  . Diabetes Mother   . Hypertension Mother   . Lung cancer Mother   . Congestive Heart Failure Father   . Congestive Heart Failure Sister     Social History   Socioeconomic History  . Marital status: Divorced    Spouse name: Not on file  . Number of children: Not on file  . Years of education: Not on file  . Highest education level: Not on file  Occupational History  . Not on file  Social Needs  . Financial resource strain: Not on file  . Food insecurity:    Worry: Not on file    Inability: Not on file  . Transportation needs:    Medical: Not on file    Non-medical: Not on file  Tobacco Use  . Smoking status: Current Every Day Smoker    Packs/day: 0.50    Years: 53.00    Pack years: 26.50    Types: Cigarettes  . Smokeless tobacco: Never Used  Substance and Sexual Activity  . Alcohol use: No  . Drug use: No  . Sexual activity: Never  Lifestyle  . Physical activity:    Days per week: Not on file    Minutes per session: Not on file  . Stress: Not on file  Relationships  . Social connections:    Talks on phone: Not on file    Gets together: Not on file    Attends religious service: Not on file    Active member of club or organization: Not on file    Attends meetings of clubs or organizations: Not on file    Relationship status: Not on file  . Intimate partner violence:    Fear of current or ex partner: Not on file    Emotionally abused: Not on file    Physically abused: Not on file    Forced sexual activity: Not on file  Other Topics Concern  . Not on file  Social History Narrative  .  Not on file   Review of Systems  Constitutional: Positive for fatigue. Negative for chills and diaphoresis.  HENT: Negative for ear pain, postnasal drip and sinus pressure.   Respiratory: Negative for cough, shortness of breath and wheezing.   Cardiovascular: Negative for chest pain, palpitations and leg swelling.  Gastrointestinal: Negative for abdominal pain, diarrhea, nausea and vomiting.  Genitourinary: Negative for dysuria and flank pain.  Musculoskeletal: Negative for arthralgias, back pain, gait problem and neck pain.  Skin: Negative for color change.  Allergic/Immunologic: Negative for environmental allergies and food allergies.  Neurological: Positive for weakness and numbness. Negative for dizziness and  headaches.  Hematological: Does not bruise/bleed easily.  Psychiatric/Behavioral: Positive for confusion. Negative for agitation, behavioral problems (depression) and hallucinations.   Vital Signs: BP 122/60   Pulse 80   Resp 16   Ht 5\' 6"  (1.676 m)   Wt 219 lb (99.3 kg)   SpO2 90%   BMI 35.35 kg/m   Physical Exam  Constitutional: She is oriented to person, place, and time. She appears well-developed and well-nourished. No distress.  HENT:  Head: Normocephalic and atraumatic.  Mouth/Throat: Oropharynx is clear and moist. No oropharyngeal exudate.  Eyes: Pupils are equal, round, and reactive to light. EOM are normal.  Neck: Normal range of motion. Neck supple. No JVD present. No tracheal deviation present. No thyromegaly present.  Cardiovascular: Normal rate, regular rhythm and normal heart sounds. Exam reveals no gallop and no friction rub.  No murmur heard. Pulmonary/Chest: Effort normal. No respiratory distress. She has no wheezes. She has no rales. She exhibits no tenderness.  Abdominal: Soft. Bowel sounds are normal.  Musculoskeletal: Normal range of motion.  Lymphadenopathy:    She has no cervical adenopathy.  Neurological: She is alert and oriented to person,  place, and time. No cranial nerve deficit.  Skin: Skin is warm and dry. She is not diaphoretic.  Psychiatric: She has a normal mood and affect. Her behavior is normal. Judgment and thought content normal.   Assessment/Plan: 1. Acute CVA (cerebrovascular accident) ([place]) - Pt continues to see cardiology and neurology - Comprehensive metabolic panel  2. Essential hypertension - Will continue Metoprolol as before   3. Acquired hypothyroidism - Recheck and prescribed as indicated  - TSH - T4, free - B12 and Folate Panel  4. Primary localized osteoarthritis of right knee - OTC Tylenol arthritis for now   5. Muscle weakness (generalized) - Pt might need rehab, this can be due to deconditioning - CBC with Differential/Platelet  6. Hyperlipidemia, unspecified hyperlipidemia type - Might need to increase her statins in future depending on risks  - Lipid Panel With LDL/HDL Ratio  7. Memory deficit after cerebral infarction - Will start aricpet - B12 and Folate Panel  8. B12 deficiency due to diet - Continue Inj - B12 and Folate Panel  General Counseling: Elizabet verbalizes understanding of the findings of todays visit and agrees with plan of treatment. I have discussed any further diagnostic evaluation that may be needed or ordered today. We also reviewed her medications today. she has been encouraged to call the office with any questions or concerns that should arise related to todays visit. Counseling: Adherence of Medical Therapy: The patient and the dughter understands that it is the responsibility of the patient to complete all prescribed medications, all recommended testing, including but not limited to, laboratory studies and imaging. The patient further understands the need to keep all scheduled follow-up visits and to inform the office immediately of any changes in their medical condition. The patient understands that the success of treatment in large part depends on the patient's  willingness to complete the therapeutic regimen and to work in partnership with the designated health-care providers.  Orders Placed This Encounter  Procedures  . CBC with Differential/Platelet  . Lipid Panel With LDL/HDL Ratio  . TSH  . T4, free  . Comprehensive metabolic panel  . B12 and Folate Panel  . B12 and Folate Panel   Time spent: 49 Minutes  Dr Lavera Guise Internal medicine

## 2018-02-12 NOTE — Telephone Encounter (Signed)
Spoke with pt and reminded pt of remote transmission that is due today. Pt verbalized understanding.   

## 2018-02-13 LAB — T4, FREE: Free T4: 0.49 ng/dL — ABNORMAL LOW (ref 0.82–1.77)

## 2018-02-13 LAB — CBC WITH DIFFERENTIAL/PLATELET
Basophils Absolute: 0.1 10*3/uL (ref 0.0–0.2)
Basos: 1 %
EOS (ABSOLUTE): 0.4 10*3/uL (ref 0.0–0.4)
Eos: 5 %
Hematocrit: 34.6 % (ref 34.0–46.6)
Hemoglobin: 11 g/dL — ABNORMAL LOW (ref 11.1–15.9)
Immature Grans (Abs): 0.1 10*3/uL (ref 0.0–0.1)
Immature Granulocytes: 1 %
Lymphocytes Absolute: 1.6 10*3/uL (ref 0.7–3.1)
Lymphs: 19 %
MCH: 26.3 pg — ABNORMAL LOW (ref 26.6–33.0)
MCHC: 31.8 g/dL (ref 31.5–35.7)
MCV: 83 fL (ref 79–97)
Monocytes Absolute: 0.5 10*3/uL (ref 0.1–0.9)
Monocytes: 6 %
Neutrophils Absolute: 5.9 10*3/uL (ref 1.4–7.0)
Neutrophils: 68 %
Platelets: 347 10*3/uL (ref 150–450)
RBC: 4.18 x10E6/uL (ref 3.77–5.28)
RDW: 15.2 % (ref 12.3–15.4)
WBC: 8.5 10*3/uL (ref 3.4–10.8)

## 2018-02-13 LAB — COMPREHENSIVE METABOLIC PANEL
ALT: 10 IU/L (ref 0–32)
AST: 16 IU/L (ref 0–40)
Albumin/Globulin Ratio: 2.1 (ref 1.2–2.2)
Albumin: 4.6 g/dL (ref 3.5–4.8)
Alkaline Phosphatase: 105 IU/L (ref 39–117)
BUN/Creatinine Ratio: 14 (ref 12–28)
BUN: 15 mg/dL (ref 8–27)
Bilirubin Total: <0.2 mg/dL (ref 0.0–1.2)
CO2: 23 mmol/L (ref 20–29)
Calcium: 9.4 mg/dL (ref 8.7–10.3)
Chloride: 107 mmol/L — ABNORMAL HIGH (ref 96–106)
Creatinine, Ser: 1.11 mg/dL — ABNORMAL HIGH (ref 0.57–1.00)
GFR calc Af Amer: 55 mL/min/{1.73_m2} — ABNORMAL LOW (ref >59)
GFR calc non Af Amer: 48 mL/min/{1.73_m2} — ABNORMAL LOW (ref >59)
Globulin, Total: 2.2 g/dL (ref 1.5–4.5)
Glucose: 131 mg/dL — ABNORMAL HIGH (ref 65–99)
Potassium: 5.4 mmol/L — ABNORMAL HIGH (ref 3.5–5.2)
Sodium: 147 mmol/L — ABNORMAL HIGH (ref 134–144)
Total Protein: 6.8 g/dL (ref 6.0–8.5)

## 2018-02-13 LAB — B12 AND FOLATE PANEL
Folate: 13.6 ng/mL (ref >3.0)
Vitamin B-12: 240 pg/mL (ref 232–1245)

## 2018-02-13 LAB — TSH: TSH: 49.75 u[IU]/mL — ABNORMAL HIGH (ref 0.450–4.500)

## 2018-02-13 LAB — LIPID PANEL WITH LDL/HDL RATIO
Cholesterol, Total: 265 mg/dL — ABNORMAL HIGH (ref 100–199)
HDL: 55 mg/dL (ref >39)
LDL Calculated: 159 mg/dL — ABNORMAL HIGH (ref 0–99)
LDl/HDL Ratio: 2.9 ratio (ref 0.0–3.2)
Triglycerides: 257 mg/dL — ABNORMAL HIGH (ref 0–149)
VLDL Cholesterol Cal: 51 mg/dL — ABNORMAL HIGH (ref 5–40)

## 2018-02-24 ENCOUNTER — Encounter

## 2018-02-24 ENCOUNTER — Other Ambulatory Visit: Payer: Self-pay | Admitting: Internal Medicine

## 2018-02-24 ENCOUNTER — Ambulatory Visit: Payer: Medicare Other | Admitting: Cardiovascular Disease

## 2018-02-24 ENCOUNTER — Telehealth: Payer: Self-pay | Admitting: Internal Medicine

## 2018-02-24 DIAGNOSIS — E785 Hyperlipidemia, unspecified: Secondary | ICD-10-CM

## 2018-02-24 DIAGNOSIS — E039 Hypothyroidism, unspecified: Secondary | ICD-10-CM

## 2018-02-24 MED ORDER — ATORVASTATIN CALCIUM 40 MG PO TABS: 80.0000 mg | ORAL_TABLET | Freq: Every day | ORAL | 3 refills | Status: DC

## 2018-02-24 MED ORDER — LEVOTHYROXINE SODIUM 100 MCG PO TABS: 137.0000 ug | ORAL_TABLET | Freq: Every day | ORAL | 3 refills | Status: DC

## 2018-02-24 NOTE — Telephone Encounter (Signed)
Spoke with daughter regarding labs and medications, pt daughter states that she wanted to remind Dr Humphrey Rolls about the memory medication that was talked about at last visit

## 2018-02-24 NOTE — Telephone Encounter (Signed)
Pt is wanting her lab results

## 2018-02-25 ENCOUNTER — Other Ambulatory Visit: Payer: Self-pay | Admitting: Internal Medicine

## 2018-02-25 MED ORDER — DONEPEZIL HCL 5 MG PO TABS: 5.0000 mg | ORAL_TABLET | Freq: Every day | ORAL | 2 refills | Status: DC

## 2018-02-26 ENCOUNTER — Telehealth: Payer: Self-pay | Admitting: Cardiology

## 2018-02-26 NOTE — Telephone Encounter (Signed)
LMOVM requesting that pt send manual transmission b/c home monitor has not updated in at least 14 days.    

## 2018-02-27 ENCOUNTER — Other Ambulatory Visit: Payer: Self-pay | Admitting: Internal Medicine

## 2018-02-27 MED ORDER — LEVOTHYROXINE SODIUM 100 MCG PO TABS: ORAL_TABLET | ORAL | 3 refills | Status: DC

## 2018-02-27 NOTE — Progress Notes (Signed)
Patient should only start with synthroid 100 mcg po qam

## 2018-02-28 ENCOUNTER — Telehealth: Payer: Self-pay

## 2018-02-28 NOTE — Telephone Encounter (Signed)
Spoke with pt daughter and informed her per DFK that on her mothers last visit Dr Latricia Heft changed the dosage for her synthroid medication to make sure that she only takes one pill a day instead of 1 1/2 a day per [facility]

## 2018-03-06 ENCOUNTER — Ambulatory Visit (INDEPENDENT_AMBULATORY_CARE_PROVIDER_SITE_OTHER): Payer: Medicare Other | Admitting: *Deleted

## 2018-03-06 DIAGNOSIS — I639 Cerebral infarction, unspecified: Secondary | ICD-10-CM | POA: Diagnosis not present

## 2018-03-06 NOTE — Progress Notes (Signed)
Carelink Summary Report / Loop Recorder 

## 2018-03-08 LAB — CUP PACEART REMOTE DEVICE CHECK
Date Time Interrogation Session: 20190615123917
Implantable Pulse Generator Implant Date: 20190516

## 2018-03-10 ENCOUNTER — Encounter: Payer: Self-pay | Admitting: Nurse Practitioner

## 2018-03-12 ENCOUNTER — Telehealth: Payer: Self-pay | Admitting: Cardiology

## 2018-03-12 NOTE — Telephone Encounter (Signed)
Spoke w/ pt daughter and requested that she send a manual transmission b/c her home monitor has not updated in at least 14 days.   

## 2018-04-02 ENCOUNTER — Telehealth: Payer: Self-pay | Admitting: Cardiology

## 2018-04-02 NOTE — Telephone Encounter (Signed)
LMOVM requesting that pt send manual transmission b/c home monitor has not updated in at least 14 days.    

## 2018-04-08 ENCOUNTER — Ambulatory Visit (INDEPENDENT_AMBULATORY_CARE_PROVIDER_SITE_OTHER): Payer: Medicare Other | Admitting: *Deleted

## 2018-04-08 DIAGNOSIS — I639 Cerebral infarction, unspecified: Secondary | ICD-10-CM

## 2018-04-08 NOTE — Progress Notes (Signed)
Carelink Summary Report / Loop Recorder 

## 2018-04-17 ENCOUNTER — Other Ambulatory Visit: Payer: Self-pay

## 2018-04-17 MED ORDER — CLOPIDOGREL BISULFATE 75 MG PO TABS: 75.0000 mg | ORAL_TABLET | Freq: Every day | ORAL | 2 refills | Status: DC

## 2018-04-18 ENCOUNTER — Telehealth: Payer: Self-pay

## 2018-04-18 NOTE — Telephone Encounter (Signed)
LMOVM requesting that pt send manual transmission b/c home monitor has not updated in at least 14 days.   Spoke w/ pt and requested that she send a manual transmission b/c her home monitor has not updated in at least 14 days.   

## 2018-04-24 LAB — CUP PACEART REMOTE DEVICE CHECK
Date Time Interrogation Session: 20190718134123
Implantable Pulse Generator Implant Date: 20190516

## 2018-04-29 ENCOUNTER — Ambulatory Visit (INDEPENDENT_AMBULATORY_CARE_PROVIDER_SITE_OTHER): Payer: Medicare Other | Admitting: Adult Health

## 2018-04-29 ENCOUNTER — Other Ambulatory Visit: Payer: Self-pay | Admitting: Adult Health

## 2018-04-29 ENCOUNTER — Encounter: Payer: Self-pay | Admitting: Adult Health

## 2018-04-29 VITALS — BP 130/81 | HR 77 | Resp 16 | Ht 66.0 in | Wt 226.8 lb

## 2018-04-29 DIAGNOSIS — Z23 Encounter for immunization: Secondary | ICD-10-CM | POA: Diagnosis not present

## 2018-04-29 DIAGNOSIS — F411 Generalized anxiety disorder: Secondary | ICD-10-CM

## 2018-04-29 DIAGNOSIS — J449 Chronic obstructive pulmonary disease, unspecified: Secondary | ICD-10-CM

## 2018-04-29 DIAGNOSIS — I10 Essential (primary) hypertension: Secondary | ICD-10-CM | POA: Diagnosis not present

## 2018-04-29 DIAGNOSIS — Z79899 Other long term (current) drug therapy: Secondary | ICD-10-CM | POA: Diagnosis not present

## 2018-04-29 DIAGNOSIS — R7989 Other specified abnormal findings of blood chemistry: Secondary | ICD-10-CM

## 2018-04-29 DIAGNOSIS — Z720 Tobacco use: Secondary | ICD-10-CM | POA: Diagnosis not present

## 2018-04-29 DIAGNOSIS — E756 Lipid storage disorder, unspecified: Secondary | ICD-10-CM | POA: Diagnosis not present

## 2018-04-29 DIAGNOSIS — E079 Disorder of thyroid, unspecified: Secondary | ICD-10-CM | POA: Diagnosis not present

## 2018-04-29 DIAGNOSIS — E785 Hyperlipidemia, unspecified: Secondary | ICD-10-CM

## 2018-04-29 DIAGNOSIS — Z1329 Encounter for screening for other suspected endocrine disorder: Secondary | ICD-10-CM | POA: Diagnosis not present

## 2018-04-29 MED ORDER — VENLAFAXINE HCL ER 75 MG PO CP24: 75.0000 mg | ORAL_CAPSULE | Freq: Every day | ORAL | 0 refills | Status: DC

## 2018-04-29 MED ORDER — PNEUMOCOCCAL VAC POLYVALENT 25 MCG/0.5ML IJ INJ: 0.5000 mL | INJECTION | INTRAMUSCULAR | 0 refills | Status: AC

## 2018-04-29 NOTE — Patient Instructions (Signed)
Thyroid-Stimulating Hormone Test Why am I having this test? A thyroid-stimulating hormone (TSH) test is a blood test that is done to measure the level of TSH, also known as thyrotropin, in your blood. TSH is produced by the pituitary gland. The pituitary gland is a small organ located just below the brain, behind your eyes and nasal passages. It is part of a system that monitors and maintains thyroid hormone levels and thyroid gland function. Thyroid hormones affect many body parts and systems, including the system that affects how quickly your body burns fuel for energy. Your health care provider may recommend testing your TSH level if you have signs and symptoms of abnormal thyroid hormone levels. Knowing the level of TSH in your blood can help your health care provider:  Diagnose a thyroid gland or pituitary gland disorder.  Manage your condition and treatment if you have hypothyroidism or hyperthyroidism.  What kind of sample is taken? A blood sample is required for this test. It is usually collected by inserting a needle into a vein. How do I prepare for this test? There is no preparation required for this test. What are the reference ranges? Reference rangesare considered healthy rangesestablished after testing a large group of healthy people. Reference rangesmay vary among different people, labs, and hospitals. It is your responsibility to obtain your test results. Ask the lab or department performing the test when and how you will get your results. Range of Normal Values:  Adult: 0.3-5 microunits/mL or 0.3-5 milliunits/L (SI units).  Newborn: 3-18 microunits/mL or 3-18 milliunits/L.  Cord: 3-12 microunits/mL or 3-12 milliunits/L.  What do the results mean? A high level of TSH may mean:  Your thyroid gland is not making enough thyroid hormones. When the thyroid gland does not make enough thyroid hormones, the pituitary gland releases TSH into the bloodstream. The  higher-than-normal levels of TSH prompt the thyroid gland to release more thyroid hormones.  You are getting an insufficient level of thyroid hormone medicine, if you are receiving this type of treatment.  There is a problem with the pituitary gland (rare).  A low level of TSH can indicate a problem with the pituitary gland. Talk with your health care provider to discuss your results, treatment options, and if necessary, the need for more tests. Talk with your health care provider if you have any questions about your results. Talk with your health care provider to discuss your results, treatment options, and if necessary, the need for more tests. Talk with your health care provider if you have any questions about your results. This information is not intended to replace advice given to you by your health care provider. Make sure you discuss any questions you have with your health care provider. Document Released: 08/31/2004 Document Revised: 04/08/2016 Document Reviewed: 12/30/2013 Elsevier Interactive Patient Education  2018 Elsevier Inc.  

## 2018-04-29 NOTE — Progress Notes (Signed)
[facility] [place], [place] [address]  Internal MEDICINE  Office Visit Note  Patient Name: [name] Douglas  [id number]  [id number]  Date of Service: 04/29/2018  Chief Complaint  Patient presents with  . Hypertension  . Hyperlipidemia  . Depression  . Groin Pain    right side ,groin down to leg ,burning sensation     HPI Pt here for follow up for HTN, HLD, and depression.  She reports she has been extremely nervous and anxious since her strokes.  She had one stroke in Jan 2018, and then two other strokes in may 2019. She is attempting to quit smoking at this time.  She reports she smokes about 1 Pack of cigarettes every two weeks.  She is also reporting some right groin burning/ pain when walking an above average distance.  She resolves when she stops walking, or when she props her foot up on the shopping cart.      Current Medication: Outpatient Encounter Medications as of 04/29/2018  Medication Sig  . acetaminophen (TYLENOL) 500 MG tablet Take 500 mg by mouth daily as needed.  . Aspirin-Acetaminophen-Caffeine (GOODYS EXTRA STRENGTH) 520-260-32.5 MG PACK Take by mouth.  Marland Kitchen atorvastatin (LIPITOR) 40 MG tablet Take 2 tablets (80 mg total) by mouth daily at 6 PM.  . calcium carbonate (OS-CAL) 1250 (500 Ca) MG chewable tablet Chew 1 tablet by mouth daily.   . clopidogrel (PLAVIX) 75 MG tablet Take 1 tablet (75 mg total) by mouth daily.  . diphenhydrAMINE HCl, Sleep, (SLEEP AID) 50 MG CAPS Take by mouth.  . docusate sodium (COLACE) 100 MG capsule Take 100 mg by mouth at bedtime.  . donepezil (ARICEPT) 5 MG tablet Take 1 tablet (5 mg total) by mouth at bedtime.  Marland Kitchen levothyroxine (SYNTHROID, LEVOTHROID) 100 MCG tablet Take one tab po qam  . metoprolol succinate (TOPROL-XL) 50 MG 24 hr tablet Take 1 tablet (50 mg total) by mouth daily. Take with or immediately following a meal.  . ondansetron (ZOFRAN) 4 MG tablet Take 1 tablet (4 mg total) by mouth every 8 (eight)  hours as needed for nausea or vomiting.  . ranitidine (RANITIDINE 150 MAX STRENGTH) 150 MG tablet Take 150 mg by mouth daily as needed for heartburn.  . venlafaxine XR (EFFEXOR-XR) 75 MG 24 hr capsule Take 3 capsules (225 mg total) by mouth daily with breakfast.   No facility-administered encounter medications on file as of 04/29/2018.     Surgical History: Past Surgical History:  Procedure Laterality Date  . CARDIAC CATHETERIZATION  ~ 2007  . EXCISIONAL HEMORRHOIDECTOMY  1960's  . EYE SURGERY Bilateral    Cataract extraction with IOL  . JOINT REPLACEMENT Left 2009  . KNEE ARTHROSCOPY Left X 2 <2008  . KNEE ARTHROSCOPY WITH SUBCHONDROPLASTY Right 04/19/2016   Procedure: KNEE ARTHROSCOPY WITH SUBCHONDROPLASTY, PARTIAL MENISCECTOMY;  Surgeon: Hessie Knows, MD;  Location: [facility] OR[facility];  Service: Orthopedics;  Laterality: Right;  . LOOP RECORDER INSERTION N/A 01/02/2018   Procedure: LOOP RECORDER INSERTION;  Surgeon: Deboraha Sprang, MD;  Location: [place] [facility];  Service: Cardiovascular;  Laterality: N/A;  . SHOULDER OPEN ROTATOR CUFF REPAIR Right 2001  . TONSILLECTOMY  ~ 1950  . TOTAL KNEE ARTHROPLASTY Left 2008  . TOTAL KNEE ARTHROPLASTY Right 07/03/2016   Procedure: TOTAL KNEE ARTHROPLASTY;  Surgeon: Hessie Knows, MD;  Location: [facility] OR[facility];  Service: Orthopedics;  Laterality: Right;  . TUBAL LIGATION  1973  . VAGINAL HYSTERECTOMY  1974  Medical History: Past Medical History:  Diagnosis Date  . Anxiety   . Arthritis    "shoulders" (08/20/2014)  . Asthma   . Chronic lower back pain   . Complication of anesthesia    "agitated & restless after knee replacement in 2008"  . COPD (chronic obstructive pulmonary disease) ([place])   . DDD (degenerative disc disease), lumbar   . Depression   . Diverticulosis   . GERD (gastroesophageal reflux disease)   . High cholesterol   . Hypertension   . Hypothyroidism   . On home oxygen therapy    "2L at night" (08/20/2014)  . Shortness of breath  dyspnea    with exertion  . Stroke [facility])     Family History: Family History  Problem Relation Age of Onset  . Diabetes Mother   . Hypertension Mother   . Lung cancer Mother   . Congestive Heart Failure Father   . Congestive Heart Failure Sister     Social History   Socioeconomic History  . Marital status: Divorced    Spouse name: Not on file  . Number of children: Not on file  . Years of education: Not on file  . Highest education level: Not on file  Occupational History  . Not on file  Social Needs  . Financial resource strain: Not on file  . Food insecurity:    Worry: Not on file    Inability: Not on file  . Transportation needs:    Medical: Not on file    Non-medical: Not on file  Tobacco Use  . Smoking status: Current Every Day Smoker    Packs/day: 0.50    Years: 53.00    Pack years: 26.50    Types: Cigarettes  . Smokeless tobacco: Never Used  Substance and Sexual Activity  . Alcohol use: No  . Drug use: No  . Sexual activity: Never  Lifestyle  . Physical activity:    Days per week: Not on file    Minutes per session: Not on file  . Stress: Not on file  Relationships  . Social connections:    Talks on phone: Not on file    Gets together: Not on file    Attends religious service: Not on file    Active member of club or organization: Not on file    Attends meetings of clubs or organizations: Not on file    Relationship status: Not on file  . Intimate partner violence:    Fear of current or ex partner: Not on file    Emotionally abused: Not on file    Physically abused: Not on file    Forced sexual activity: Not on file  Other Topics Concern  . Not on file  Social History Narrative  . Not on file      Review of Systems  Constitutional: Negative for chills, fatigue and unexpected weight change.  HENT: Negative for congestion, rhinorrhea, sneezing and sore throat.   Eyes: Negative for photophobia, pain and redness.  Respiratory: Negative for  cough, chest tightness and shortness of breath.   Cardiovascular: Negative for chest pain and palpitations.  Gastrointestinal: Negative for abdominal pain, constipation, diarrhea, nausea and vomiting.  Endocrine: Negative.   Genitourinary: Negative for dysuria and frequency.  Musculoskeletal: Negative for arthralgias, back pain, joint swelling and neck pain.  Skin: Negative for rash.  Allergic/Immunologic: Negative.   Neurological: Negative for tremors and numbness.  Hematological: Negative for adenopathy. Does not bruise/bleed easily.  Psychiatric/Behavioral: Negative for behavioral  problems and sleep disturbance. The patient is not nervous/anxious.     Vital Signs: BP 130/81   Pulse 77   Resp 16   Ht 5\' 6"  (1.676 m)   Wt 226 lb 12.8 oz (102.9 kg)   SpO2 98%   BMI 36.61 kg/m    Physical Exam  Constitutional: She is oriented to person, place, and time. She appears well-developed and well-nourished. No distress.  HENT:  Head: Normocephalic and atraumatic.  Mouth/Throat: Oropharynx is clear and moist. No oropharyngeal exudate.  Eyes: Pupils are equal, round, and reactive to light. EOM are normal.  Neck: Normal range of motion. Neck supple. No JVD present. No tracheal deviation present. No thyromegaly present.  Cardiovascular: Normal rate, regular rhythm and normal heart sounds. Exam reveals no gallop and no friction rub.  No murmur heard. Pulmonary/Chest: Effort normal and breath sounds normal. No respiratory distress. She has no wheezes. She has no rales. She exhibits no tenderness.  Abdominal: Soft. There is no tenderness. There is no guarding.  Musculoskeletal: Normal range of motion.  Lymphadenopathy:    She has no cervical adenopathy.  Neurological: She is alert and oriented to person, place, and time. No cranial nerve deficit.  Skin: Skin is warm and dry. She is not diaphoretic.  Psychiatric: She has a normal mood and affect. Her behavior is normal. Judgment and thought  content normal.  Nursing note and vitals reviewed.  Assessment/Plan: 1. GAD (generalized anxiety disorder) Restart Effexor for anxiety.   - venlafaxine XR (EFFEXOR-XR) 75 MG 24 hr capsule; Take 1 capsule (75 mg total) by mouth daily with breakfast.  Dispense: 45 capsule; Refill: 0  2. Essential hypertension Controlled at this time.  Continue medications.   3. Hyperlipidemia, unspecified hyperlipidemia type   Elevated Cholesterol, will recheck.   4. Chronic obstructive pulmonary disease, unspecified COPD type ([place]) She does not use inhalers.  5. Tobacco abuse Smoking cessation counseling: 1. Pt acknowledges the risks of long term smoking, she will try to quite smoking. 2. Options for different medications including nicotine products, chewing gum, patch etc, Wellbutrin and Chantix is discussed 3. Goal and date of compete cessation is discussed 4. Total time spent in smoking cessation is 15 min.  6. Flu vaccine need - Flu Vaccine MDCK QUAD PF  7. Elevated TSH Pts last TSH 49.7, will repeat TSH at this time.   General Counseling: tamy accardo understanding of the findings of todays visit and agrees with plan of treatment. I have discussed any further diagnostic evaluation that may be needed or ordered today. We also reviewed her medications today. she has been encouraged to call the office with any questions or concerns that should arise related to todays visit.    Orders Placed This Encounter  Procedures  . Flu Vaccine MDCK QUAD PF    No orders of the defined types were placed in this encounter.   Time spent: 25 Minutes   This patient was seen by Orson Gear AGNP-C in Collaboration with Dr Lavera Guise as a part of collaborative care agreement    Dr Lavera Guise Internal medicine

## 2018-04-30 LAB — T4, FREE: Free T4: 1.21 ng/dL (ref 0.82–1.77)

## 2018-04-30 LAB — LIPID PANEL WITH LDL/HDL RATIO
Cholesterol, Total: 137 mg/dL (ref 100–199)
HDL: 59 mg/dL (ref >39)
LDL Calculated: 49 mg/dL (ref 0–99)
LDl/HDL Ratio: 0.8 ratio (ref 0.0–3.2)
Triglycerides: 147 mg/dL (ref 0–149)
VLDL Cholesterol Cal: 29 mg/dL (ref 5–40)

## 2018-04-30 LAB — TESTOSTERONE: Testosterone: 16 ng/dL (ref 3–41)

## 2018-05-02 LAB — TSH: TSH: 1.13 u[IU]/mL (ref 0.450–4.500)

## 2018-05-02 LAB — SPECIMEN STATUS REPORT

## 2018-05-05 ENCOUNTER — Telehealth: Payer: Self-pay | Admitting: Cardiology

## 2018-05-05 NOTE — Telephone Encounter (Signed)
LMOVM requesting that pt send manual transmission b/c home monitor has not updated in at least 14 days.    

## 2018-05-05 NOTE — Telephone Encounter (Signed)
Spoke w/ pt and requested that she send a manual transmission b/c her home monitor has not updated in at least 14 days.   

## 2018-05-12 ENCOUNTER — Ambulatory Visit (INDEPENDENT_AMBULATORY_CARE_PROVIDER_SITE_OTHER): Payer: Medicare Other | Admitting: *Deleted

## 2018-05-12 DIAGNOSIS — I639 Cerebral infarction, unspecified: Secondary | ICD-10-CM | POA: Diagnosis not present

## 2018-05-12 LAB — CUP PACEART REMOTE DEVICE CHECK
Date Time Interrogation Session: 20190820144137
Implantable Pulse Generator Implant Date: 20190516

## 2018-05-12 NOTE — Progress Notes (Signed)
Carelink Summary Report / Loop Recorder 

## 2018-05-19 ENCOUNTER — Telehealth: Payer: Self-pay | Admitting: Cardiology

## 2018-05-19 LAB — CUP PACEART REMOTE DEVICE CHECK
Date Time Interrogation Session: 20190922153705
Implantable Pulse Generator Implant Date: 20190516

## 2018-05-19 NOTE — Telephone Encounter (Signed)
Spoke w/ pt and requested that she send a manual transmission b/c her home monitor has not updated in at least 14 days.   

## 2018-05-21 ENCOUNTER — Other Ambulatory Visit: Payer: Self-pay | Admitting: Adult Health

## 2018-05-21 ENCOUNTER — Other Ambulatory Visit: Payer: Self-pay | Admitting: Internal Medicine

## 2018-05-21 DIAGNOSIS — F411 Generalized anxiety disorder: Secondary | ICD-10-CM

## 2018-05-21 DIAGNOSIS — I10 Essential (primary) hypertension: Secondary | ICD-10-CM

## 2018-05-21 MED ORDER — METOPROLOL SUCCINATE ER 50 MG PO TB24: 50.0000 mg | ORAL_TABLET | Freq: Every day | ORAL | 5 refills | Status: DC

## 2018-05-21 MED ORDER — VENLAFAXINE HCL ER 75 MG PO CP24: ORAL_CAPSULE | ORAL | 2 refills | Status: DC

## 2018-06-03 ENCOUNTER — Telehealth: Payer: Self-pay | Admitting: Cardiology

## 2018-06-03 NOTE — Telephone Encounter (Signed)
Spoke w/ pt and requested that she send a manual transmission b/c her home monitor has not updated in at least 14 days.   

## 2018-06-06 ENCOUNTER — Other Ambulatory Visit: Payer: Self-pay | Admitting: Internal Medicine

## 2018-06-10 ENCOUNTER — Ambulatory Visit: Payer: Self-pay | Admitting: Internal Medicine

## 2018-06-13 ENCOUNTER — Ambulatory Visit (INDEPENDENT_AMBULATORY_CARE_PROVIDER_SITE_OTHER): Payer: Medicare Other | Admitting: *Deleted

## 2018-06-13 DIAGNOSIS — I639 Cerebral infarction, unspecified: Secondary | ICD-10-CM

## 2018-06-13 NOTE — Progress Notes (Signed)
Carelink Summary Report / Loop Recorder 

## 2018-06-27 LAB — CUP PACEART REMOTE DEVICE CHECK
Date Time Interrogation Session: 20191025161116
Implantable Pulse Generator Implant Date: 20190516

## 2018-07-11 ENCOUNTER — Telehealth: Payer: Self-pay | Admitting: *Deleted

## 2018-07-11 ENCOUNTER — Telehealth: Payer: Self-pay

## 2018-07-11 NOTE — Telephone Encounter (Signed)
Spoke with patient regarding tachy episode on 06/28/18 at 1135 noted on LINQ, duration 19min 37sec, median V rate 176bpm (received via manual transmission today due to disconnected monitor). ECG appears SVT. Patient denies any recent palpitations, presyncope, or syncope.   Patient reports a sensation in her left breast that felt like someone stabbing her with a pin. The sensation came and went for a few days last week, but she has not noted it for the past 3-4 days. Sensation improved when patient pressed on or rubbed the painful area. She cannot tell if it is at her ILR site as she can no longer tell where that was implanted. No drainage/redness/swelling. Advised patient that this pain is likely not cardiac, but I will route this message to Dr. Caryl Comes as an Juluis Rainier. She is appreciative and denies additional questions or concerns at this time.

## 2018-07-11 NOTE — Telephone Encounter (Signed)
Spoke w/ pt and requested that she send a manual transmission b/c her home monitor has not updated in at least 14 days.   

## 2018-07-15 NOTE — Telephone Encounter (Signed)
Noted  

## 2018-07-16 ENCOUNTER — Ambulatory Visit (INDEPENDENT_AMBULATORY_CARE_PROVIDER_SITE_OTHER): Payer: Medicare Other

## 2018-07-16 DIAGNOSIS — I6310 Cerebral infarction due to embolism of unspecified precerebral artery: Secondary | ICD-10-CM | POA: Diagnosis not present

## 2018-07-21 ENCOUNTER — Other Ambulatory Visit: Payer: Self-pay

## 2018-07-21 DIAGNOSIS — I10 Essential (primary) hypertension: Secondary | ICD-10-CM

## 2018-07-21 DIAGNOSIS — F411 Generalized anxiety disorder: Secondary | ICD-10-CM

## 2018-07-21 MED ORDER — LEVOTHYROXINE SODIUM 100 MCG PO TABS: ORAL_TABLET | ORAL | 1 refills | Status: DC

## 2018-07-21 MED ORDER — METOPROLOL SUCCINATE ER 50 MG PO TB24: 50.0000 mg | ORAL_TABLET | Freq: Every day | ORAL | 1 refills | Status: DC

## 2018-07-21 MED ORDER — DONEPEZIL HCL 5 MG PO TABS: 5.0000 mg | ORAL_TABLET | Freq: Every day | ORAL | 1 refills | Status: DC

## 2018-07-21 MED ORDER — VENLAFAXINE HCL ER 75 MG PO CP24: ORAL_CAPSULE | ORAL | 1 refills | Status: DC

## 2018-07-21 MED ORDER — CLOPIDOGREL BISULFATE 75 MG PO TABS: 75.0000 mg | ORAL_TABLET | Freq: Every day | ORAL | 2 refills | Status: DC

## 2018-07-21 NOTE — Progress Notes (Signed)
Carelink Summary Report / Loop Recorder 

## 2018-07-22 ENCOUNTER — Other Ambulatory Visit: Payer: Self-pay

## 2018-07-22 MED ORDER — CLOPIDOGREL BISULFATE 75 MG PO TABS: 75.0000 mg | ORAL_TABLET | Freq: Every day | ORAL | 1 refills | Status: DC

## 2018-08-18 ENCOUNTER — Ambulatory Visit (INDEPENDENT_AMBULATORY_CARE_PROVIDER_SITE_OTHER): Payer: Medicare Other

## 2018-08-18 DIAGNOSIS — I6310 Cerebral infarction due to embolism of unspecified precerebral artery: Secondary | ICD-10-CM

## 2018-08-18 NOTE — Progress Notes (Signed)
Carelink Summary Report / Loop Recorder 

## 2018-08-19 LAB — CUP PACEART REMOTE DEVICE CHECK
Date Time Interrogation Session: 20191230171040
Implantable Pulse Generator Implant Date: 20190516

## 2018-08-31 LAB — CUP PACEART REMOTE DEVICE CHECK
Date Time Interrogation Session: 20191127164043
Implantable Pulse Generator Implant Date: 20190516

## 2018-09-16 ENCOUNTER — Other Ambulatory Visit: Payer: Self-pay | Admitting: Internal Medicine

## 2018-09-22 ENCOUNTER — Ambulatory Visit (INDEPENDENT_AMBULATORY_CARE_PROVIDER_SITE_OTHER): Payer: Medicare Other

## 2018-09-22 DIAGNOSIS — I639 Cerebral infarction, unspecified: Secondary | ICD-10-CM

## 2018-09-22 DIAGNOSIS — I10 Essential (primary) hypertension: Secondary | ICD-10-CM

## 2018-09-25 LAB — CUP PACEART REMOTE DEVICE CHECK
Date Time Interrogation Session: 20200201171027
Implantable Pulse Generator Implant Date: 20190516

## 2018-10-01 NOTE — Progress Notes (Signed)
Carelink Summary Report / Loop Recorder 

## 2018-10-24 ENCOUNTER — Ambulatory Visit (INDEPENDENT_AMBULATORY_CARE_PROVIDER_SITE_OTHER): Payer: Medicare Other | Admitting: *Deleted

## 2018-10-24 DIAGNOSIS — I639 Cerebral infarction, unspecified: Secondary | ICD-10-CM | POA: Diagnosis not present

## 2018-10-25 LAB — CUP PACEART REMOTE DEVICE CHECK
Date Time Interrogation Session: 20200305174406
Implantable Pulse Generator Implant Date: 20190516

## 2018-10-31 NOTE — Progress Notes (Signed)
Carelink Summary Report / Loop Recorder 

## 2018-11-03 ENCOUNTER — Other Ambulatory Visit: Payer: Self-pay | Admitting: Internal Medicine

## 2018-11-25 ENCOUNTER — Other Ambulatory Visit: Payer: Self-pay

## 2018-11-25 ENCOUNTER — Ambulatory Visit (INDEPENDENT_AMBULATORY_CARE_PROVIDER_SITE_OTHER): Payer: Medicare Other | Admitting: *Deleted

## 2018-11-25 DIAGNOSIS — I639 Cerebral infarction, unspecified: Secondary | ICD-10-CM | POA: Diagnosis not present

## 2018-11-25 LAB — CUP PACEART REMOTE DEVICE CHECK
Date Time Interrogation Session: 20200407180926
Implantable Pulse Generator Implant Date: 20190516

## 2018-12-04 NOTE — Progress Notes (Signed)
Carelink Summary Report / Loop Recorde

## 2018-12-16 ENCOUNTER — Other Ambulatory Visit: Payer: Self-pay | Admitting: Internal Medicine

## 2018-12-29 ENCOUNTER — Ambulatory Visit (INDEPENDENT_AMBULATORY_CARE_PROVIDER_SITE_OTHER): Payer: Medicare Other | Admitting: *Deleted

## 2018-12-29 ENCOUNTER — Other Ambulatory Visit: Payer: Self-pay

## 2018-12-29 DIAGNOSIS — I639 Cerebral infarction, unspecified: Secondary | ICD-10-CM | POA: Diagnosis not present

## 2018-12-29 LAB — CUP PACEART REMOTE DEVICE CHECK
Date Time Interrogation Session: 20200510183726
Implantable Pulse Generator Implant Date: 20190516

## 2019-01-09 NOTE — Progress Notes (Signed)
Carelink Summary Report / Loop Recorder 

## 2019-01-30 ENCOUNTER — Ambulatory Visit (INDEPENDENT_AMBULATORY_CARE_PROVIDER_SITE_OTHER): Payer: Medicare Other | Admitting: *Deleted

## 2019-01-30 DIAGNOSIS — I639 Cerebral infarction, unspecified: Secondary | ICD-10-CM

## 2019-02-02 LAB — CUP PACEART REMOTE DEVICE CHECK
Date Time Interrogation Session: 20200612194123
Implantable Pulse Generator Implant Date: 20190516

## 2019-02-03 NOTE — Progress Notes (Signed)
Carelink Summary Report / Loop Recorder 

## 2019-02-10 ENCOUNTER — Other Ambulatory Visit: Payer: Self-pay

## 2019-02-10 DIAGNOSIS — I10 Essential (primary) hypertension: Secondary | ICD-10-CM

## 2019-02-10 MED ORDER — METOPROLOL SUCCINATE ER 50 MG PO TB24: 50.0000 mg | ORAL_TABLET | Freq: Every day | ORAL | 0 refills | Status: DC

## 2019-02-10 NOTE — Telephone Encounter (Signed)
Spoke with pt need follow up appt and gave tat call to make appt

## 2019-03-04 ENCOUNTER — Ambulatory Visit (INDEPENDENT_AMBULATORY_CARE_PROVIDER_SITE_OTHER): Payer: Medicare Other | Admitting: *Deleted

## 2019-03-04 DIAGNOSIS — I639 Cerebral infarction, unspecified: Secondary | ICD-10-CM | POA: Diagnosis not present

## 2019-03-05 LAB — CUP PACEART REMOTE DEVICE CHECK
Date Time Interrogation Session: 20200715200855
Implantable Pulse Generator Implant Date: 20190516

## 2019-03-14 NOTE — Progress Notes (Signed)
Carelink Summary Report / Loop Recorder 

## 2019-03-31 ENCOUNTER — Encounter: Payer: Self-pay | Admitting: Nurse Practitioner

## 2019-03-31 ENCOUNTER — Ambulatory Visit (INDEPENDENT_AMBULATORY_CARE_PROVIDER_SITE_OTHER): Payer: Medicare Other | Admitting: Nurse Practitioner

## 2019-03-31 ENCOUNTER — Other Ambulatory Visit: Payer: Self-pay

## 2019-03-31 VITALS — BP 186/69 | HR 83 | Resp 16 | Ht 66.0 in | Wt 220.0 lb

## 2019-03-31 DIAGNOSIS — E039 Hypothyroidism, unspecified: Secondary | ICD-10-CM

## 2019-03-31 DIAGNOSIS — R300 Dysuria: Secondary | ICD-10-CM

## 2019-03-31 DIAGNOSIS — I10 Essential (primary) hypertension: Secondary | ICD-10-CM | POA: Diagnosis not present

## 2019-03-31 DIAGNOSIS — Z0001 Encounter for general adult medical examination with abnormal findings: Secondary | ICD-10-CM | POA: Diagnosis not present

## 2019-03-31 DIAGNOSIS — R112 Nausea with vomiting, unspecified: Secondary | ICD-10-CM | POA: Diagnosis not present

## 2019-03-31 DIAGNOSIS — Z23 Encounter for immunization: Secondary | ICD-10-CM

## 2019-03-31 MED ORDER — METOPROLOL SUCCINATE ER 50 MG PO TB24: 100.0000 mg | ORAL_TABLET | Freq: Every day | ORAL | 0 refills | Status: DC

## 2019-03-31 MED ORDER — ONDANSETRON HCL 4 MG PO TABS: 4.0000 mg | ORAL_TABLET | Freq: Three times a day (TID) | ORAL | 0 refills | Status: DC | PRN

## 2019-03-31 MED ORDER — LEVOTHYROXINE SODIUM 100 MCG PO TABS: ORAL_TABLET | ORAL | 1 refills | Status: DC

## 2019-03-31 MED ORDER — PNEUMOVAX 23 25 MCG/0.5ML IJ INJ: INJECTION | INTRAMUSCULAR | 0 refills | Status: DC

## 2019-03-31 NOTE — Progress Notes (Signed)
[facility] [place], [place] [address]  Internal MEDICINE  Office Visit Note  Patient Name: [name] Douglas  [id number]  [id number]  Date of Service: 04/05/2019   Pt is here for routine health maintenance examination  Chief Complaint  Patient presents with  . Annual Exam  . Anxiety  . Arthritis  . Gastroesophageal Reflux  . Hypertension  . Hyperlipidemia  . Depression  . Quality Metric Gaps    PNA vacc  . Medication Refill    zofran and levothyroxine  . Ear Drainage     The patient is here for health maintenance exam. Blood pressure moderately elevated today. Has been taking her medication without missing any doses. States that she feels well. No concerns or complaints. Does state that she has some ear pain and drainage from both ears, the left ear is worse than the right. She denies nasal congestion. She continues to get cluster type headaches on the right side of the head. frequcney and severity of these has remained about the same.     Current Medication: Outpatient Encounter Medications as of 03/31/2019  Medication Sig  . acetaminophen (TYLENOL) 500 MG tablet Take 500 mg by mouth daily as needed.  Marland Kitchen atorvastatin (LIPITOR) 40 MG tablet TAKE 2 TABLETS (80 MG TOTAL) BY MOUTH DAILY AT 6PM  . calcium carbonate (OS-CAL) 1250 (500 Ca) MG chewable tablet Chew 1 tablet by mouth daily.   . clopidogrel (PLAVIX) 75 MG tablet Take 1 tablet (75 mg total) by mouth daily.  . diphenhydrAMINE HCl, Sleep, (SLEEP AID) 25 MG CAPS Take by mouth.   . docusate sodium (COLACE) 100 MG capsule Take 100 mg by mouth at bedtime.  Marland Kitchen levothyroxine (SYNTHROID) 100 MCG tablet TAKE 1.5 TABLETS (150 MCG TOTAL) BY MOUTH DAILY BEFORE BREAKFAST  . metoprolol succinate (TOPROL-XL) 50 MG 24 hr tablet Take 2 tablets (100 mg total) by mouth daily.  . ondansetron (ZOFRAN) 4 MG tablet Take 1 tablet (4 mg total) by mouth every 8 (eight) hours as needed for nausea or vomiting.  .  venlafaxine XR (EFFEXOR-XR) 75 MG 24 hr capsule TAKE 1 CAPSULE BY MOUTH DAILY FOR 14 DAYS. THEN INCREASE TO 2 CAPSULES BY MOUTH DAILY. (Patient taking differently: TAKE 2 CAPSULES BY MOUTH DAILY.)  . [DISCONTINUED] donepezil (ARICEPT) 5 MG tablet Take 1 tablet (5 mg total) by mouth at bedtime.  . [DISCONTINUED] levothyroxine (SYNTHROID) 100 MCG tablet TAKE 1.5 TABLETS (150 MCG TOTAL) BY MOUTH DAILY BEFORE BREAKFAST  . [DISCONTINUED] metoprolol succinate (TOPROL-XL) 50 MG 24 hr tablet Take 1 tablet (50 mg total) by mouth daily. Take with or immediately following a meal.  . [DISCONTINUED] ondansetron (ZOFRAN) 4 MG tablet Take 1 tablet (4 mg total) by mouth every 8 (eight) hours as needed for nausea or vomiting.  . pneumococcal 23 valent vaccine (PNEUMOVAX 23) 25 MCG/0.5ML injection Inject 0.61ml IM once  . [DISCONTINUED] Aspirin-Acetaminophen-Caffeine (GOODYS EXTRA STRENGTH) 520-260-32.5 MG PACK Take by mouth.  . [DISCONTINUED] ranitidine (RANITIDINE 150 MAX STRENGTH) 150 MG tablet Take 150 mg by mouth daily as needed for heartburn.   No facility-administered encounter medications on file as of 03/31/2019.     Surgical History: Past Surgical History:  Procedure Laterality Date  . CARDIAC CATHETERIZATION  ~ 2007  . EXCISIONAL HEMORRHOIDECTOMY  1960's  . EYE SURGERY Bilateral    Cataract extraction with IOL  . JOINT REPLACEMENT Left 2009  . KNEE ARTHROSCOPY Left X 2 <2008  . KNEE ARTHROSCOPY WITH SUBCHONDROPLASTY Right 04/19/2016  Procedure: KNEE ARTHROSCOPY WITH SUBCHONDROPLASTY, PARTIAL MENISCECTOMY;  Surgeon: Hessie Knows, MD;  Location: [facility];  Service: Orthopedics;  Laterality: Right;  . LOOP RECORDER INSERTION N/A 01/02/2018   Procedure: LOOP RECORDER INSERTION;  Surgeon: Deboraha Sprang, MD;  Location: [facility];  Service: Cardiovascular;  Laterality: N/A;  . SHOULDER OPEN ROTATOR CUFF REPAIR Right 2001  . TONSILLECTOMY  ~ 1950  . TOTAL KNEE ARTHROPLASTY Left 2008  . TOTAL  KNEE ARTHROPLASTY Right 07/03/2016   Procedure: TOTAL KNEE ARTHROPLASTY;  Surgeon: Hessie Knows, MD;  Location: [facility] OR[facility];  Service: Orthopedics;  Laterality: Right;  . TUBAL LIGATION  1973  . VAGINAL HYSTERECTOMY  1974    Medical History: Past Medical History:  Diagnosis Date  . Anxiety   . Arthritis    "shoulders" (08/20/2014)  . Asthma   . Chronic lower back pain   . Complication of anesthesia    "agitated & restless after knee replacement in 2008"  . COPD (chronic obstructive pulmonary disease) ([place])   . DDD (degenerative disc disease), lumbar   . Depression   . Diverticulosis   . GERD (gastroesophageal reflux disease)   . High cholesterol   . Hypertension   . Hypothyroidism   . On home oxygen therapy    "2L at night" (08/20/2014)  . Shortness of breath dyspnea    with exertion  . Stroke [facility])     Family History: Family History  Problem Relation Age of Onset  . Diabetes Mother   . Hypertension Mother   . Lung cancer Mother   . Congestive Heart Failure Father   . Congestive Heart Failure Sister       Review of Systems  Constitutional: Positive for fatigue. Negative for chills and diaphoresis.  HENT: Negative for ear pain, postnasal drip and sinus pressure.   Respiratory: Negative for cough, shortness of breath and wheezing.   Cardiovascular: Negative for chest pain, palpitations and leg swelling.  Gastrointestinal: Negative for abdominal pain, diarrhea, nausea and vomiting.  Endocrine: Negative for cold intolerance, heat intolerance, polydipsia and polyuria.  Genitourinary: Negative for dysuria and flank pain.  Musculoskeletal: Negative for arthralgias, back pain, gait problem and neck pain.  Skin: Negative for color change.  Allergic/Immunologic: Negative for environmental allergies and food allergies.  Neurological: Positive for weakness, numbness and headaches. Negative for dizziness.  Hematological: Does not bruise/bleed easily.  Psychiatric/Behavioral:  Positive for confusion. Negative for agitation, behavioral problems (depression) and hallucinations.    Today's Vitals   03/31/19 1458  BP: (!) 186/69  Pulse: 83  Resp: 16  SpO2: 94%  Weight: 220 lb (99.8 kg)  Height: 5\' 6"  (1.676 m)   Body mass index is 35.51 kg/m.   Physical Exam Vitals signs and nursing note reviewed.  Constitutional:      General: She is not in acute distress.    Appearance: Normal appearance. She is well-developed. She is not diaphoretic.  HENT:     Head: Normocephalic and atraumatic.     Nose: Nose normal.     Mouth/Throat:     Pharynx: No oropharyngeal exudate.  Eyes:     Pupils: Pupils are equal, round, and reactive to light.  Neck:     Musculoskeletal: Normal range of motion and neck supple.     Thyroid: No thyromegaly.     Vascular: No JVD.     Trachea: No tracheal deviation.  Cardiovascular:     Rate and Rhythm: Normal rate and regular rhythm.     Heart  sounds: Normal heart sounds. No murmur. No friction rub. No gallop.   Pulmonary:     Effort: Pulmonary effort is normal. No respiratory distress.     Breath sounds: Normal breath sounds. No wheezing or rales.  Chest:     Chest wall: No tenderness.  Abdominal:     General: Bowel sounds are normal.     Palpations: Abdomen is soft.     Tenderness: There is no abdominal tenderness. There is no guarding.  Musculoskeletal: Normal range of motion.  Lymphadenopathy:     Cervical: No cervical adenopathy.  Skin:    General: Skin is warm and dry.  Neurological:     Mental Status: She is alert and oriented to person, place, and time. Mental status is at baseline.     Cranial Nerves: No cranial nerve deficit.  Psychiatric:        Behavior: Behavior normal.        Thought Content: Thought content normal.        Judgment: Judgment normal.    Depression screen [facility] 2/9 03/31/2019 04/29/2018 02/12/2018 11/04/2017 08/02/2017  Decreased Interest 0 0 0 0 0  Down, Depressed, Hopeless 0 0 0 0 0  PHQ - 2  Score 0 0 0 0 0    Functional Status Survey: Is the patient deaf or have difficulty hearing?: Yes Does the patient have difficulty seeing, even when wearing glasses/contacts?: No Does the patient have difficulty concentrating, remembering, or making decisions?: Yes Does the patient have difficulty walking or climbing stairs?: No(unable climb stairs) Does the patient have difficulty dressing or bathing?: No Does the patient have difficulty doing errands alone such as visiting a doctor's office or shopping?: Yes  MMSE - [place] Exam 03/31/2019  Orientation to time 5  Orientation to Place 5  Registration 3  Attention/ Calculation 5  Recall 3  Language- name 2 objects 2  Language- repeat 1  Language- follow 3 step command 3  Language- read & follow direction 1  Write a sentence 1  Copy design 1  Total score 30    Fall Risk  03/31/2019 04/29/2018 02/12/2018 11/04/2017 08/02/2017  Falls in the past year? 0 No No No No  Comment - - - - -  Number falls in past yr: - - - - -  Comment - - - - -  Injury with Fall? - - - - -      LABS: Recent Results (from the past 2160 hour(s))  CUP PACEART REMOTE DEVICE CHECK     Status: None   Collection Time: 01/30/19  7:41 PM  Result Value Ref Range   Date Time Interrogation Session [id number]    Pulse Generator Manufacturer MERM    Pulse Gen Model WNI62 Reveal LINQ    Pulse Gen Serial Number [id number]    Clinic Name [facility]    Implantable Pulse Generator Type ICM/ILR    Implantable Pulse Generator Implant Date [id number]   CUP PACEART REMOTE DEVICE CHECK     Status: None   Collection Time: 03/04/19  8:08 PM  Result Value Ref Range   Date Time Interrogation Session [id number]    Pulse Generator Manufacturer MERM    Pulse Gen Model LNQ11 Reveal LINQ    Pulse Gen Serial Number [id number]    Clinic Name [facility]    Implantable Pulse Generator Type ICM/ILR    Implantable Pulse Generator Implant Date [id number]     Eval Rhythm SR at 61 bpm   Urinalysis, Routine  w reflex microscopic     Status: None   Collection Time: 03/31/19  2:59 PM  Result Value Ref Range   Specific Gravity, UA 1.018 1.005 - 1.030   pH, UA 6.0 5.0 - 7.5   Color, UA Yellow Yellow   Appearance Ur Clear Clear   Leukocytes,UA Negative Negative   Protein,UA Negative Negative/Trace   Glucose, UA Negative Negative   Ketones, UA Negative Negative   RBC, UA Negative Negative   Bilirubin, UA Negative Negative   Urobilinogen, Ur 0.2 0.2 - 1.0 mg/dL   Nitrite, UA Negative Negative   Microscopic Examination Comment     Comment: Microscopic not indicated and not performed.    Assessment/Plan: 1. Encounter for general adult medical examination with abnormal findings Annual health maintenance today.   2. Essential hypertension Increase metoprolol ER to 50mg , two tablets daily. Monitor blood pressures closely at home. Continue other bp medication as prescribed.  - metoprolol succinate (TOPROL-XL) 50 MG 24 hr tablet; Take 2 tablets (100 mg total) by mouth daily.  Dispense: 180 tablet; Refill: 0  3. Acquired hypothyroidism Check thyroid panel. Adjust levothyroxine as indicated.  - levothyroxine (SYNTHROID) 100 MCG tablet; TAKE 1.5 TABLETS (150 MCG TOTAL) BY MOUTH DAILY BEFORE BREAKFAST  Dispense: 90 tablet; Refill: 1  4. Nausea and vomiting, intractability of vomiting not specified, unspecified vomiting type - ondansetron (ZOFRAN) 4 MG tablet; Take 1 tablet (4 mg total) by mouth every 8 (eight) hours as needed for nausea or vomiting.  Dispense: 30 tablet; Refill: 0  5. Need for vaccination against Streptococcus pneumoniae using pneumococcal conjugate vaccine 13 Prescription for Pneumovax sent to her pharmacy for administration.  - pneumococcal 23 valent vaccine (PNEUMOVAX 23) 25 MCG/0.5ML injection; Inject 0.86ml IM once  Dispense: 0.5 mL; Refill: 0  6. Dysuria - Urinalysis, Routine w reflex microscopic  General Counseling: Sandy Salaam understanding of the findings of todays visit and agrees with plan of treatment. I have discussed any further diagnostic evaluation that may be needed or ordered today. We also reviewed her medications today. she has been encouraged to call the office with any questions or concerns that should arise related to todays visit.    Counseling:  Hypertension Counseling:   The following hypertensive lifestyle modification were recommended and discussed:  1. Limiting alcohol intake to less than 1 oz/day of ethanol:(24 oz of beer or 8 oz of wine or 2 oz of 100-proof whiskey). 2. Take baby ASA 81 mg daily. 3. Importance of regular aerobic exercise and losing weight. 4. Reduce dietary saturated fat and cholesterol intake for overall cardiovascular health. 5. Maintaining adequate dietary potassium, calcium, and magnesium intake. 6. Regular monitoring of the blood pressure. 7. Reduce sodium intake to less than 100 mmol/day (less than 2.3 gm of sodium or less than 6 gm of sodium choride)   This patient was seen by [place] with Dr Lavera Guise as a part of collaborative care agreement  Orders Placed This Encounter  Procedures  . Urinalysis, Routine w reflex microscopic    Meds ordered this encounter  Medications  . pneumococcal 23 valent vaccine (PNEUMOVAX 23) 25 MCG/0.5ML injection    Sig: Inject 0.71ml IM once    Dispense:  0.5 mL    Refill:  0    Order Specific Question:   Supervising Provider    Answer:   Lavera Guise [[id number]]  . ondansetron (ZOFRAN) 4 MG tablet    Sig: Take 1 tablet (4 mg total) by  mouth every 8 (eight) hours as needed for nausea or vomiting.    Dispense:  30 tablet    Refill:  0    Order Specific Question:   Supervising Provider    Answer:   Lavera Guise [[id number]]  . levothyroxine (SYNTHROID) 100 MCG tablet    Sig: TAKE 1.5 TABLETS (150 MCG TOTAL) BY MOUTH DAILY BEFORE BREAKFAST    Dispense:  90 tablet    Refill:  1    Order Specific  Question:   Supervising Provider    Answer:   Lavera Guise [[id number]]  . metoprolol succinate (TOPROL-XL) 50 MG 24 hr tablet    Sig: Take 2 tablets (100 mg total) by mouth daily.    Dispense:  180 tablet    Refill:  0    Order Specific Question:   Supervising Provider    Answer:   Lavera Guise [[id number]]    Time spent: [place], MD  Internal Medicine

## 2019-04-01 LAB — URINALYSIS, ROUTINE W REFLEX MICROSCOPIC
Bilirubin, UA: NEGATIVE
Glucose, UA: NEGATIVE
Ketones, UA: NEGATIVE
Leukocytes,UA: NEGATIVE
Nitrite, UA: NEGATIVE
Protein,UA: NEGATIVE
RBC, UA: NEGATIVE
Specific Gravity, UA: 1.018 (ref 1.005–1.030)
Urobilinogen, Ur: 0.2 mg/dL (ref 0.2–1.0)
pH, UA: 6 (ref 5.0–7.5)

## 2019-04-03 ENCOUNTER — Other Ambulatory Visit: Payer: Self-pay | Admitting: Nurse Practitioner

## 2019-04-03 MED ORDER — DONEPEZIL HCL 5 MG PO TABS: 5.0000 mg | ORAL_TABLET | Freq: Every day | ORAL | 1 refills | Status: DC

## 2019-04-05 DIAGNOSIS — R112 Nausea with vomiting, unspecified: Secondary | ICD-10-CM | POA: Insufficient documentation

## 2019-04-05 DIAGNOSIS — R300 Dysuria: Secondary | ICD-10-CM | POA: Insufficient documentation

## 2019-04-05 DIAGNOSIS — Z23 Encounter for immunization: Secondary | ICD-10-CM | POA: Insufficient documentation

## 2019-04-05 DIAGNOSIS — Z0001 Encounter for general adult medical examination with abnormal findings: Secondary | ICD-10-CM | POA: Insufficient documentation

## 2019-04-06 ENCOUNTER — Other Ambulatory Visit: Payer: Self-pay | Admitting: Nurse Practitioner

## 2019-04-06 ENCOUNTER — Ambulatory Visit (INDEPENDENT_AMBULATORY_CARE_PROVIDER_SITE_OTHER): Payer: Medicare Other | Admitting: *Deleted

## 2019-04-06 DIAGNOSIS — I639 Cerebral infarction, unspecified: Secondary | ICD-10-CM | POA: Diagnosis not present

## 2019-04-06 DIAGNOSIS — F411 Generalized anxiety disorder: Secondary | ICD-10-CM

## 2019-04-06 LAB — CUP PACEART REMOTE DEVICE CHECK
Date Time Interrogation Session: 20200817181405
Implantable Pulse Generator Implant Date: 20190516

## 2019-04-06 MED ORDER — ATORVASTATIN CALCIUM 40 MG PO TABS: ORAL_TABLET | ORAL | 3 refills | Status: DC

## 2019-04-06 MED ORDER — VENLAFAXINE HCL ER 75 MG PO CP24: ORAL_CAPSULE | ORAL | 1 refills | Status: DC

## 2019-04-14 NOTE — Progress Notes (Signed)
Carelink Summary Report / Loop Recorder 

## 2019-05-07 ENCOUNTER — Other Ambulatory Visit: Payer: Self-pay | Admitting: Internal Medicine

## 2019-05-07 DIAGNOSIS — Z0001 Encounter for general adult medical examination with abnormal findings: Secondary | ICD-10-CM | POA: Diagnosis not present

## 2019-05-07 DIAGNOSIS — E559 Vitamin D deficiency, unspecified: Secondary | ICD-10-CM | POA: Diagnosis not present

## 2019-05-07 DIAGNOSIS — E039 Hypothyroidism, unspecified: Secondary | ICD-10-CM | POA: Diagnosis not present

## 2019-05-07 DIAGNOSIS — I10 Essential (primary) hypertension: Secondary | ICD-10-CM | POA: Diagnosis not present

## 2019-05-08 LAB — CBC
Hematocrit: 31.2 % — ABNORMAL LOW (ref 34.0–46.6)
Hemoglobin: 10.3 g/dL — ABNORMAL LOW (ref 11.1–15.9)
MCH: 26.5 pg — ABNORMAL LOW (ref 26.6–33.0)
MCHC: 33 g/dL (ref 31.5–35.7)
MCV: 80 fL (ref 79–97)
Platelets: 262 10*3/uL (ref 150–450)
RBC: 3.89 x10E6/uL (ref 3.77–5.28)
RDW: 14.3 % (ref 11.7–15.4)
WBC: 6.8 10*3/uL (ref 3.4–10.8)

## 2019-05-08 LAB — TSH: TSH: 0.654 u[IU]/mL (ref 0.450–4.500)

## 2019-05-08 LAB — LIPID PANEL W/O CHOL/HDL RATIO
Cholesterol, Total: 138 mg/dL (ref 100–199)
HDL: 49 mg/dL (ref >39)
LDL Chol Calc (NIH): 70 mg/dL (ref 0–99)
Triglycerides: 106 mg/dL (ref 0–149)
VLDL Cholesterol Cal: 19 mg/dL (ref 5–40)

## 2019-05-08 LAB — COMPREHENSIVE METABOLIC PANEL
ALT: 10 IU/L (ref 0–32)
AST: 18 IU/L (ref 0–40)
Albumin/Globulin Ratio: 2 (ref 1.2–2.2)
Albumin: 4.2 g/dL (ref 3.7–4.7)
Alkaline Phosphatase: 96 IU/L (ref 39–117)
BUN/Creatinine Ratio: 16 (ref 12–28)
BUN: 15 mg/dL (ref 8–27)
Bilirubin Total: 0.3 mg/dL (ref 0.0–1.2)
CO2: 26 mmol/L (ref 20–29)
Calcium: 9.2 mg/dL (ref 8.7–10.3)
Chloride: 103 mmol/L (ref 96–106)
Creatinine, Ser: 0.96 mg/dL (ref 0.57–1.00)
GFR calc Af Amer: 65 mL/min/{1.73_m2} (ref >59)
GFR calc non Af Amer: 56 mL/min/{1.73_m2} — ABNORMAL LOW (ref >59)
Globulin, Total: 2.1 g/dL (ref 1.5–4.5)
Glucose: 96 mg/dL (ref 65–99)
Potassium: 4.3 mmol/L (ref 3.5–5.2)
Sodium: 142 mmol/L (ref 134–144)
Total Protein: 6.3 g/dL (ref 6.0–8.5)

## 2019-05-08 LAB — T4, FREE: Free T4: 1.1 ng/dL (ref 0.82–1.77)

## 2019-05-08 LAB — VITAMIN D 25 HYDROXY (VIT D DEFICIENCY, FRACTURES): Vit D, 25-Hydroxy: 8.6 ng/mL — ABNORMAL LOW (ref 30.0–100.0)

## 2019-05-08 LAB — T3: T3, Total: 94 ng/dL (ref 71–180)

## 2019-05-11 ENCOUNTER — Ambulatory Visit (INDEPENDENT_AMBULATORY_CARE_PROVIDER_SITE_OTHER): Payer: Medicare Other | Admitting: *Deleted

## 2019-05-11 DIAGNOSIS — I639 Cerebral infarction, unspecified: Secondary | ICD-10-CM | POA: Diagnosis not present

## 2019-05-11 LAB — CUP PACEART REMOTE DEVICE CHECK
Date Time Interrogation Session: 20200919203801
Implantable Pulse Generator Implant Date: 20190516

## 2019-05-15 ENCOUNTER — Ambulatory Visit (INDEPENDENT_AMBULATORY_CARE_PROVIDER_SITE_OTHER): Payer: Medicare Other | Admitting: Nurse Practitioner

## 2019-05-15 ENCOUNTER — Encounter: Payer: Self-pay | Admitting: Nurse Practitioner

## 2019-05-15 ENCOUNTER — Other Ambulatory Visit: Payer: Self-pay

## 2019-05-15 VITALS — BP 130/80 | HR 77 | Temp 97.3°F | Resp 16 | Ht 66.0 in | Wt 216.0 lb

## 2019-05-15 DIAGNOSIS — E559 Vitamin D deficiency, unspecified: Secondary | ICD-10-CM

## 2019-05-15 DIAGNOSIS — E039 Hypothyroidism, unspecified: Secondary | ICD-10-CM | POA: Diagnosis not present

## 2019-05-15 DIAGNOSIS — D509 Iron deficiency anemia, unspecified: Secondary | ICD-10-CM

## 2019-05-15 DIAGNOSIS — Z23 Encounter for immunization: Secondary | ICD-10-CM

## 2019-05-15 DIAGNOSIS — I10 Essential (primary) hypertension: Secondary | ICD-10-CM

## 2019-05-15 MED ORDER — ERGOCALCIFEROL 1.25 MG (50000 UT) PO CAPS: 50000.0000 [IU] | ORAL_CAPSULE | ORAL | 5 refills | Status: DC

## 2019-05-15 NOTE — Progress Notes (Signed)
[facility] [place],  [id number]  Internal MEDICINE  Office Visit Note  Patient Name: [name] Douglas  [id number]  [id number]  Date of Service: 05/17/2019  Chief Complaint  Patient presents with  . Hypertension  . Hyperlipidemia  . Gastroesophageal Reflux  . Hypothyroidism    [name] Douglas presents to clinic for follow-up on laboratory results. We reviewed [name] Douglas labs and discussed [name] Douglas decreased vitamin D, hemoglobin, and hematocrit levels. [name] Douglas understanding and states that she does frequently feel fatigued. We also discussed [name] Douglas blood pressure, which is down to 130/80 from 186/69 after increasing [name] Douglas metoprolol dosing at [name] Douglas last clinic visit. [name] Douglas denies and side effects from the dose increase. She also denies any other questions or concerns.      Current Medication: Outpatient Encounter Medications as of 05/15/2019  Medication Sig  . acetaminophen (TYLENOL) 500 MG tablet Take 500 mg by mouth daily as needed.  Marland Kitchen atorvastatin (LIPITOR) 40 MG tablet TAKE 2 TABLETS (80 MG TOTAL) BY MOUTH DAILY AT 6PM  . calcium carbonate (OS-CAL) 1250 (500 Ca) MG chewable tablet Chew 1 tablet by mouth daily.   . clopidogrel (PLAVIX) 75 MG tablet Take 1 tablet (75 mg total) by mouth daily.  . diphenhydrAMINE HCl, Sleep, (SLEEP AID) 25 MG CAPS Take by mouth.   . docusate sodium (COLACE) 100 MG capsule Take 100 mg by mouth at bedtime.  . donepezil (ARICEPT) 5 MG tablet Take 1 tablet (5 mg total) by mouth at bedtime.  Marland Kitchen levothyroxine (SYNTHROID) 100 MCG tablet TAKE 1.5 TABLETS (150 MCG TOTAL) BY MOUTH DAILY BEFORE BREAKFAST  . metoprolol succinate (TOPROL-XL) 50 MG 24 hr tablet Take 2 tablets (100 mg total) by mouth daily.  . ondansetron (ZOFRAN) 4 MG tablet Take 1 tablet (4 mg total) by mouth every 8 (eight) hours as needed for nausea or vomiting.  . pneumococcal 23 valent vaccine (PNEUMOVAX 23) 25 MCG/0.5ML injection Inject 0.48ml IM once  .  venlafaxine XR (EFFEXOR-XR) 75 MG 24 hr capsule TAKE 2 CAPSULES BY MOUTH DAILY.  . ergocalciferol (DRISDOL) 1.25 MG (50000 UT) capsule Take 1 capsule (50,000 Units total) by mouth once a week.   No facility-administered encounter medications on file as of 05/15/2019.     Surgical History: Past Surgical History:  Procedure Laterality Date  . CARDIAC CATHETERIZATION  ~ 2007  . EXCISIONAL HEMORRHOIDECTOMY  1960's  . EYE SURGERY Bilateral    Cataract extraction with IOL  . JOINT REPLACEMENT Left 2009  . KNEE ARTHROSCOPY Left X 2 <2008  . KNEE ARTHROSCOPY WITH SUBCHONDROPLASTY Right 04/19/2016   Procedure: KNEE ARTHROSCOPY WITH SUBCHONDROPLASTY, PARTIAL MENISCECTOMY;  Surgeon: Hessie Knows, MD;  Location: [facility] ORS;  Service: Orthopedics;  Laterality: Right;  . LOOP RECORDER INSERTION N/A 01/02/2018   Procedure: LOOP RECORDER INSERTION;  Surgeon: Deboraha Sprang, MD;  Location: [place] [facility];  Service: Cardiovascular;  Laterality: N/A;  . SHOULDER OPEN ROTATOR CUFF REPAIR Right 2001  . TONSILLECTOMY  ~ 1950  . TOTAL KNEE ARTHROPLASTY Left 2008  . TOTAL KNEE ARTHROPLASTY Right 07/03/2016   Procedure: TOTAL KNEE ARTHROPLASTY;  Surgeon: Hessie Knows, MD;  Location: [facility] ORS;  Service: Orthopedics;  Laterality: Right;  . TUBAL LIGATION  1973  . VAGINAL HYSTERECTOMY  1974    Medical History: Past Medical History:  Diagnosis Date  . Anxiety   . Arthritis    "shoulders" (08/20/2014)  . Asthma   . Chronic lower back pain   . Complication  of anesthesia    "agitated & restless after knee replacement in 2008"  . COPD (chronic obstructive pulmonary disease) ([place])   . DDD (degenerative disc disease), lumbar   . Depression   . Diverticulosis   . GERD (gastroesophageal reflux disease)   . High cholesterol   . Hypertension   . Hypothyroidism   . On home oxygen therapy    "2L at night" (08/20/2014)  . Shortness of breath dyspnea    with exertion  . Stroke [facility])     Family  History: Family History  Problem Relation Age of Onset  . Diabetes Mother   . Hypertension Mother   . Lung cancer Mother   . Congestive Heart Failure Father   . Congestive Heart Failure Sister     Social History   Socioeconomic History  . Marital status: Divorced    Spouse name: Not on file  . Number of children: Not on file  . Years of education: Not on file  . Highest education level: Not on file  Occupational History  . Not on file  Social Needs  . Financial resource strain: Not on file  . Food insecurity    Worry: Not on file    Inability: Not on file  . Transportation needs    Medical: Not on file    Non-medical: Not on file  Tobacco Use  . Smoking status: Current Some Day Smoker    Years: 53.00    Types: Cigarettes  . Smokeless tobacco: Never Used  Substance and Sexual Activity  . Alcohol use: No  . Drug use: No  . Sexual activity: Never  Lifestyle  . Physical activity    Days per week: Not on file    Minutes per session: Not on file  . Stress: Not on file  Relationships  . Social Herbalist on phone: Not on file    Gets together: Not on file    Attends religious service: Not on file    Active member of club or organization: Not on file    Attends meetings of clubs or organizations: Not on file    Relationship status: Not on file  . Intimate partner violence    Fear of current or ex partner: Not on file    Emotionally abused: Not on file    Physically abused: Not on file    Forced sexual activity: Not on file  Other Topics Concern  . Not on file  Social History Narrative  . Not on file      Review of Systems  Constitutional: Positive for fatigue. Negative for activity change, chills and unexpected weight change.  HENT: Negative for congestion, postnasal drip, rhinorrhea, sneezing and sore throat.   Respiratory: Negative for cough, chest tightness, shortness of breath and wheezing.   Cardiovascular: Negative for chest pain and  palpitations.       Improved blood pressure after increased metoprolol dose.   Gastrointestinal: Negative for abdominal pain, constipation, diarrhea, nausea and vomiting.  Endocrine: Negative for cold intolerance, heat intolerance, polydipsia and polyuria.  Musculoskeletal: Negative for arthralgias, back pain, joint swelling and neck pain.  Skin: Negative for rash.  Allergic/Immunologic: Negative for environmental allergies.  Neurological: Negative for dizziness, tremors, numbness and headaches.  Hematological: Negative for adenopathy. Does not bruise/bleed easily.  Psychiatric/Behavioral: Negative for behavioral problems (Depression), sleep disturbance and suicidal ideas. The patient is not nervous/anxious.     Today's Vitals   05/15/19 1520  BP: 130/80  Pulse: 77  Resp: 16  Temp: (!) 97.3 F (36.3 C)  SpO2: 97%  Weight: 216 lb (98 kg)  Height: 5\' 6"  (1.676 m)   Body mass index is 34.86 kg/m.  Physical Exam Vitals signs and nursing note reviewed.  Constitutional:      Appearance: Normal appearance.  HENT:     Head: Normocephalic and atraumatic.  Eyes:     Pupils: Pupils are equal, round, and reactive to light.  Neck:     Musculoskeletal: Normal range of motion and neck supple.     Vascular: No carotid bruit.  Cardiovascular:     Rate and Rhythm: Normal rate and regular rhythm.     Pulses: Normal pulses.     Heart sounds: Normal heart sounds.  Pulmonary:     Effort: Pulmonary effort is normal.     Breath sounds: Normal breath sounds.  Abdominal:     General: Bowel sounds are normal.     Palpations: Abdomen is soft.     Tenderness: There is no abdominal tenderness.  Musculoskeletal: Normal range of motion.  Skin:    General: Skin is warm and dry.  Neurological:     Mental Status: She is alert and oriented to person, place, and time. Mental status is at baseline.  Psychiatric:        Mood and Affect: Mood normal.        Behavior: Behavior normal.        Thought  Content: Thought content normal.        Judgment: Judgment normal.   .Assessment/Plan: 1. Essential hypertension Improved with increased dose metoprolol. Continue as prescribed.   2. Acquired hypothyroidism Thyroid panel stable. Continue levothyroxine as prescribed.   3. Iron deficiency anemia, unspecified iron deficiency anemia type Recommend OTC iron supplement everyday. Should take stool softener along with iron supplement.   4. Vitamin D deficiency - ergocalciferol (DRISDOL) 1.25 MG (50000 UT) capsule; Take 1 capsule (50,000 Units total) by mouth once a week.  Dispense: 4 capsule; Refill: 5  5. Flu vaccine need - Flu Vaccine MDCK QUAD PF  General Counseling: [name] Douglas verbalizes understanding of the findings of todays visit and agrees with plan of treatment. I have discussed any further diagnostic evaluation that may be needed or ordered today. We also reviewed [name] Douglas medications today. she has been encouraged to call the office with any questions or concerns that should arise related to todays visit.  Hypertension Counseling:   The following hypertensive lifestyle modification were recommended and discussed:  1. Limiting alcohol intake to less than 1 oz/day of ethanol:(24 oz of beer or 8 oz of wine or 2 oz of 100-proof whiskey). 2. Take baby ASA 81 mg daily. 3. Importance of regular aerobic exercise and losing weight. 4. Reduce dietary saturated fat and cholesterol intake for overall cardiovascular health. 5. Maintaining adequate dietary potassium, calcium, and magnesium intake. 6. Regular monitoring of the blood pressure. 7. Reduce sodium intake to less than 100 mmol/day (less than 2.3 gm of sodium or less than 6 gm of sodium choride)   This patient was seen by [place] with Dr Lavera Guise as a part of collaborative care agreement  Orders Placed This Encounter  Procedures  . Flu Vaccine MDCK QUAD PF    Meds ordered this encounter  Medications  .  ergocalciferol (DRISDOL) 1.25 MG (50000 UT) capsule    Sig: Take 1 capsule (50,000 Units total) by mouth once a week.    Dispense:  4 capsule  Refill:  5    Order Specific Question:   Supervising Provider    Answer:   Lavera Guise [id number]    Time spent: 49 Minutes      Dr Lavera Guise Internal medicine

## 2019-05-15 NOTE — Progress Notes (Deleted)
Established Patient Office Visit  Subjective:  Patient ID: [name] Douglas, female    DOB: May 20, 1941  Age: 78 y.o. MRN: [id number]  CC:  Chief Complaint  Patient presents with  . Hypertension  . Hyperlipidemia  . Gastroesophageal Reflux  . Hypothyroidism    HPI [name] presents for ***  Past Medical History:  Diagnosis Date  . Anxiety   . Arthritis    "shoulders" (08/20/2014)  . Asthma   . Chronic lower back pain   . Complication of anesthesia    "agitated & restless after knee replacement in 2008"  . COPD (chronic obstructive pulmonary disease) ([place])   . DDD (degenerative disc disease), lumbar   . Depression   . Diverticulosis   . GERD (gastroesophageal reflux disease)   . High cholesterol   . Hypertension   . Hypothyroidism   . On home oxygen therapy    "2L at night" (08/20/2014)  . Shortness of breath dyspnea    with exertion  . Stroke [facility])     Past Surgical History:  Procedure Laterality Date  . CARDIAC CATHETERIZATION  ~ 2007  . EXCISIONAL HEMORRHOIDECTOMY  1960's  . EYE SURGERY Bilateral    Cataract extraction with IOL  . JOINT REPLACEMENT Left 2009  . KNEE ARTHROSCOPY Left X 2 <2008  . KNEE ARTHROSCOPY WITH SUBCHONDROPLASTY Right 04/19/2016   Procedure: KNEE ARTHROSCOPY WITH SUBCHONDROPLASTY, PARTIAL MENISCECTOMY;  Surgeon: Hessie Knows, MD;  Location: [facility] ORS;  Service: Orthopedics;  Laterality: Right;  . LOOP RECORDER INSERTION N/A 01/02/2018   Procedure: LOOP RECORDER INSERTION;  Surgeon: Deboraha Sprang, MD;  Location: [place] [facility];  Service: Cardiovascular;  Laterality: N/A;  . SHOULDER OPEN ROTATOR CUFF REPAIR Right 2001  . TONSILLECTOMY  ~ 1950  . TOTAL KNEE ARTHROPLASTY Left 2008  . TOTAL KNEE ARTHROPLASTY Right 07/03/2016   Procedure: TOTAL KNEE ARTHROPLASTY;  Surgeon: Hessie Knows, MD;  Location: [facility] ORS;  Service: Orthopedics;  Laterality: Right;  . TUBAL LIGATION  1973  . VAGINAL HYSTERECTOMY  1974    Family History   Problem Relation Age of Onset  . Diabetes Mother   . Hypertension Mother   . Lung cancer Mother   . Congestive Heart Failure Father   . Congestive Heart Failure Sister     Social History   Socioeconomic History  . Marital status: Divorced    Spouse name: Not on file  . Number of children: Not on file  . Years of education: Not on file  . Highest education level: Not on file  Occupational History  . Not on file  Social Needs  . Financial resource strain: Not on file  . Food insecurity    Worry: Not on file    Inability: Not on file  . Transportation needs    Medical: Not on file    Non-medical: Not on file  Tobacco Use  . Smoking status: Current Some Day Smoker    Years: 53.00    Types: Cigarettes  . Smokeless tobacco: Never Used  Substance and Sexual Activity  . Alcohol use: No  . Drug use: No  . Sexual activity: Never  Lifestyle  . Physical activity    Days per week: Not on file    Minutes per session: Not on file  . Stress: Not on file  Relationships  . Social Herbalist on phone: Not on file    Gets together: Not on file    Attends religious service:  Not on file    Active member of club or organization: Not on file    Attends meetings of clubs or organizations: Not on file    Relationship status: Not on file  . Intimate partner violence    Fear of current or ex partner: Not on file    Emotionally abused: Not on file    Physically abused: Not on file    Forced sexual activity: Not on file  Other Topics Concern  . Not on file  Social History Narrative  . Not on file    Outpatient Medications Prior to Visit  Medication Sig Dispense Refill  . acetaminophen (TYLENOL) 500 MG tablet Take 500 mg by mouth daily as needed.    Marland Kitchen atorvastatin (LIPITOR) 40 MG tablet TAKE 2 TABLETS (80 MG TOTAL) BY MOUTH DAILY AT 6PM 180 tablet 3  . calcium carbonate (OS-CAL) 1250 (500 Ca) MG chewable tablet Chew 1 tablet by mouth daily.     . clopidogrel (PLAVIX) 75  MG tablet Take 1 tablet (75 mg total) by mouth daily. 90 tablet 1  . diphenhydrAMINE HCl, Sleep, (SLEEP AID) 25 MG CAPS Take by mouth.     . docusate sodium (COLACE) 100 MG capsule Take 100 mg by mouth at bedtime.    . donepezil (ARICEPT) 5 MG tablet Take 1 tablet (5 mg total) by mouth at bedtime. 90 tablet 1  . levothyroxine (SYNTHROID) 100 MCG tablet TAKE 1.5 TABLETS (150 MCG TOTAL) BY MOUTH DAILY BEFORE BREAKFAST 90 tablet 1  . metoprolol succinate (TOPROL-XL) 50 MG 24 hr tablet Take 2 tablets (100 mg total) by mouth daily. 180 tablet 0  . ondansetron (ZOFRAN) 4 MG tablet Take 1 tablet (4 mg total) by mouth every 8 (eight) hours as needed for nausea or vomiting. 30 tablet 0  . pneumococcal 23 valent vaccine (PNEUMOVAX 23) 25 MCG/0.5ML injection Inject 0.26ml IM once 0.5 mL 0  . venlafaxine XR (EFFEXOR-XR) 75 MG 24 hr capsule TAKE 2 CAPSULES BY MOUTH DAILY. 180 capsule 1   No facility-administered medications prior to visit.     Allergies  Allergen Reactions  . Heparin Nausea And Vomiting    ROS Review of Systems    Objective:    Physical Exam  BP 130/80   Pulse 77   Temp (!) 97.3 F (36.3 C)   Resp 16   Ht 5\' 6"  (1.676 m)   Wt 216 lb (98 kg)   SpO2 97%   BMI 34.86 kg/m  Wt Readings from Last 3 Encounters:  05/15/19 216 lb (98 kg)  03/31/19 220 lb (99.8 kg)  04/29/18 226 lb 12.8 oz (102.9 kg)     Health Maintenance Due  Topic Date Due  . TETANUS/TDAP  10/19/1958  . PNA vac Low Risk Adult (2 of 2 - PPSV23) 08/15/2013  . INFLUENZA VACCINE  03/21/2019    There are no preventive care reminders to display for this patient.  Lab Results  Component Value Date   TSH 0.654 05/07/2019   Lab Results  Component Value Date   WBC 6.8 05/07/2019   HGB 10.3 (L) 05/07/2019   HCT 31.2 (L) 05/07/2019   MCV 80 05/07/2019   PLT 262 05/07/2019   Lab Results  Component Value Date   NA 142 05/07/2019   K 4.3 05/07/2019   CO2 26 05/07/2019   GLUCOSE 96 05/07/2019   BUN  15 05/07/2019   CREATININE 0.96 05/07/2019   BILITOT 0.3 05/07/2019   ALKPHOS 96 05/07/2019  AST 18 05/07/2019   ALT 10 05/07/2019   PROT 6.3 05/07/2019   ALBUMIN 4.2 05/07/2019   CALCIUM 9.2 05/07/2019   ANIONGAP 10 12/20/2017   Lab Results  Component Value Date   CHOL 138 05/07/2019   Lab Results  Component Value Date   HDL 49 05/07/2019   Lab Results  Component Value Date   LDLCALC 49 04/29/2018   Lab Results  Component Value Date   TRIG 106 05/07/2019   Lab Results  Component Value Date   CHOLHDL 4.2 12/21/2017   Lab Results  Component Value Date   HGBA1C 6.2 (H) 12/20/2017      Assessment & Plan:   Problem List Items Addressed This Visit    None    Visit Diagnoses    Flu vaccine need    -  Primary   Relevant Orders   Flu Vaccine MDCK QUAD PF (Completed)      No orders of the defined types were placed in this encounter.   Follow-up: No follow-ups on file.    Ronnell Freshwater, NP

## 2019-05-17 DIAGNOSIS — D509 Iron deficiency anemia, unspecified: Secondary | ICD-10-CM | POA: Insufficient documentation

## 2019-05-17 DIAGNOSIS — E559 Vitamin D deficiency, unspecified: Secondary | ICD-10-CM | POA: Insufficient documentation

## 2019-05-17 DIAGNOSIS — Z23 Encounter for immunization: Secondary | ICD-10-CM | POA: Insufficient documentation

## 2019-05-19 NOTE — Progress Notes (Signed)
Carelink Summary Report / Loop Recorder 

## 2019-06-11 ENCOUNTER — Ambulatory Visit (INDEPENDENT_AMBULATORY_CARE_PROVIDER_SITE_OTHER): Payer: Medicare Other | Admitting: *Deleted

## 2019-06-11 DIAGNOSIS — I639 Cerebral infarction, unspecified: Secondary | ICD-10-CM | POA: Diagnosis not present

## 2019-06-13 LAB — CUP PACEART REMOTE DEVICE CHECK
Date Time Interrogation Session: 20201022204245
Implantable Pulse Generator Implant Date: 20190516

## 2019-06-23 NOTE — Progress Notes (Signed)
Carelink Summary Report / Loop Recorder 

## 2019-06-25 ENCOUNTER — Other Ambulatory Visit: Payer: Self-pay

## 2019-06-25 MED ORDER — CLOPIDOGREL BISULFATE 75 MG PO TABS: 75.0000 mg | ORAL_TABLET | Freq: Every day | ORAL | 1 refills | Status: DC

## 2019-07-14 ENCOUNTER — Ambulatory Visit (INDEPENDENT_AMBULATORY_CARE_PROVIDER_SITE_OTHER): Payer: Medicare Other | Admitting: *Deleted

## 2019-07-14 DIAGNOSIS — G459 Transient cerebral ischemic attack, unspecified: Secondary | ICD-10-CM

## 2019-07-15 LAB — CUP PACEART REMOTE DEVICE CHECK
Date Time Interrogation Session: 20201124154607
Implantable Pulse Generator Implant Date: 20190516

## 2019-08-17 ENCOUNTER — Ambulatory Visit (INDEPENDENT_AMBULATORY_CARE_PROVIDER_SITE_OTHER): Payer: Medicare Other | Admitting: *Deleted

## 2019-08-17 DIAGNOSIS — G459 Transient cerebral ischemic attack, unspecified: Secondary | ICD-10-CM

## 2019-08-17 LAB — CUP PACEART REMOTE DEVICE CHECK
Date Time Interrogation Session: 20201227154550
Implantable Pulse Generator Implant Date: 20190516

## 2019-08-21 DIAGNOSIS — F17210 Nicotine dependence, cigarettes, uncomplicated: Secondary | ICD-10-CM | POA: Insufficient documentation

## 2019-08-21 DIAGNOSIS — F419 Anxiety disorder, unspecified: Secondary | ICD-10-CM | POA: Insufficient documentation

## 2019-08-21 DIAGNOSIS — Z96653 Presence of artificial knee joint, bilateral: Secondary | ICD-10-CM | POA: Insufficient documentation

## 2019-08-21 DIAGNOSIS — Z9981 Dependence on supplemental oxygen: Secondary | ICD-10-CM | POA: Insufficient documentation

## 2019-09-03 ENCOUNTER — Other Ambulatory Visit: Payer: Self-pay

## 2019-09-03 DIAGNOSIS — I10 Essential (primary) hypertension: Secondary | ICD-10-CM

## 2019-09-03 MED ORDER — METOPROLOL SUCCINATE ER 50 MG PO TB24: 100.0000 mg | ORAL_TABLET | Freq: Every day | ORAL | 0 refills | Status: DC

## 2019-09-08 DIAGNOSIS — Z23 Encounter for immunization: Secondary | ICD-10-CM | POA: Diagnosis not present

## 2019-09-16 ENCOUNTER — Telehealth: Payer: Self-pay

## 2019-09-16 NOTE — Telephone Encounter (Signed)
Confirmed telephone visit with patient.klh 

## 2019-09-17 ENCOUNTER — Ambulatory Visit (INDEPENDENT_AMBULATORY_CARE_PROVIDER_SITE_OTHER): Payer: Medicare Other | Admitting: *Deleted

## 2019-09-17 DIAGNOSIS — G459 Transient cerebral ischemic attack, unspecified: Secondary | ICD-10-CM | POA: Diagnosis not present

## 2019-09-17 LAB — CUP PACEART REMOTE DEVICE CHECK
Date Time Interrogation Session: 20210128023458
Implantable Pulse Generator Implant Date: 20190516

## 2019-09-17 NOTE — Progress Notes (Signed)
ILR Remote 

## 2019-09-18 ENCOUNTER — Encounter: Payer: Self-pay | Admitting: Nurse Practitioner

## 2019-09-18 ENCOUNTER — Ambulatory Visit (INDEPENDENT_AMBULATORY_CARE_PROVIDER_SITE_OTHER): Payer: Medicare Other | Admitting: Nurse Practitioner

## 2019-09-18 VITALS — Ht 66.0 in

## 2019-09-18 DIAGNOSIS — G44011 Episodic cluster headache, intractable: Secondary | ICD-10-CM | POA: Diagnosis not present

## 2019-09-18 DIAGNOSIS — I6523 Occlusion and stenosis of bilateral carotid arteries: Secondary | ICD-10-CM

## 2019-09-18 DIAGNOSIS — R29818 Other symptoms and signs involving the nervous system: Secondary | ICD-10-CM

## 2019-09-18 NOTE — Progress Notes (Signed)
[facility] [place], [place] [address]  Internal MEDICINE  Telephone Visit  Patient Name: [name] Douglas  [id number]  [id number]  Date of Service: 09/20/2019  I connected with the patient at 2:31pm by telephone and verified the patients identity using two identifiers.   I discussed the limitations, risks, security and privacy concerns of performing an evaluation and management service by telephone and the availability of in person appointments. I also discussed with the patient that there may be a patient responsible charge related to the service.  The patient expressed understanding and agrees to proceed.    Chief Complaint  Patient presents with  . Telephone Assessment  . Telephone Screen  . Hypertension  . Gastroesophageal Reflux  . Depression    The patient has been contacted via telephone for follow up visit due to concerns for spread of novel coronavirus. The patient presents for routine visit. She continues to have sharp headaches on the right side of her head. They are getting worse and more frequent. They do not last for a long time, but the pain is so severe, she can scrunch up and that seems to alleviate the pain. She states that she gets relief after a few minutes. She denies weakness, slurred speech, or other neruological deficits with these headaches. She does have history of CVA in the past. Is afraid these headaches may be result of or sign of new events happening. Reviewed results of most recent MRI of the brain which was done in 2019. We also reviewed carotid ultrasound doen 2019.       Current Medication: Outpatient Encounter Medications as of 09/18/2019  Medication Sig  . acetaminophen (TYLENOL) 500 MG tablet Take 500 mg by mouth daily as needed.  Marland Kitchen atorvastatin (LIPITOR) 40 MG tablet TAKE 2 TABLETS (80 MG TOTAL) BY MOUTH DAILY AT 6PM  . calcium carbonate (OS-CAL) 1250 (500 Ca) MG chewable tablet Chew 1 tablet by mouth daily.   .  clopidogrel (PLAVIX) 75 MG tablet Take 1 tablet (75 mg total) by mouth daily.  . diphenhydrAMINE HCl, Sleep, (SLEEP AID) 25 MG CAPS Take by mouth.   . docusate sodium (COLACE) 100 MG capsule Take 100 mg by mouth at bedtime.  . donepezil (ARICEPT) 5 MG tablet Take 1 tablet (5 mg total) by mouth at bedtime.  . ergocalciferol (DRISDOL) 1.25 MG (50000 UT) capsule Take 1 capsule (50,000 Units total) by mouth once a week.  . levothyroxine (SYNTHROID) 100 MCG tablet TAKE 1.5 TABLETS (150 MCG TOTAL) BY MOUTH DAILY BEFORE BREAKFAST  . metoprolol succinate (TOPROL-XL) 50 MG 24 hr tablet Take 2 tablets (100 mg total) by mouth daily.  . ondansetron (ZOFRAN) 4 MG tablet Take 1 tablet (4 mg total) by mouth every 8 (eight) hours as needed for nausea or vomiting.  . pneumococcal 23 valent vaccine (PNEUMOVAX 23) 25 MCG/0.5ML injection Inject 0.83ml IM once  . venlafaxine XR (EFFEXOR-XR) 75 MG 24 hr capsule TAKE 2 CAPSULES BY MOUTH DAILY.   No facility-administered encounter medications on file as of 09/18/2019.    Surgical History: Past Surgical History:  Procedure Laterality Date  . CARDIAC CATHETERIZATION  ~ 2007  . EXCISIONAL HEMORRHOIDECTOMY  1960's  . EYE SURGERY Bilateral    Cataract extraction with IOL  . JOINT REPLACEMENT Left 2009  . KNEE ARTHROSCOPY Left X 2 <2008  . KNEE ARTHROSCOPY WITH SUBCHONDROPLASTY Right 04/19/2016   Procedure: KNEE ARTHROSCOPY WITH SUBCHONDROPLASTY, PARTIAL MENISCECTOMY;  Surgeon: Hessie Knows, MD;  [facility]  ORS;  Service: Orthopedics;  Laterality: Right;  . LOOP RECORDER INSERTION N/A 01/02/2018   Procedure: LOOP RECORDER INSERTION;  Surgeon: Deboraha Sprang, MD;  Location: [facility];  Service: Cardiovascular;  Laterality: N/A;  . SHOULDER OPEN ROTATOR CUFF REPAIR Right 2001  . TONSILLECTOMY  ~ 1950  . TOTAL KNEE ARTHROPLASTY Left 2008  . TOTAL KNEE ARTHROPLASTY Right 07/03/2016   Procedure: TOTAL KNEE ARTHROPLASTY;  Surgeon: Hessie Knows, MD;  Location:  [facility] ORS;  Service: Orthopedics;  Laterality: Right;  . TUBAL LIGATION  1973  . VAGINAL HYSTERECTOMY  1974    Medical History: Past Medical History:  Diagnosis Date  . Anxiety   . Arthritis    "shoulders" (08/20/2014)  . Asthma   . Chronic lower back pain   . Complication of anesthesia    "agitated & restless after knee replacement in 2008"  . COPD (chronic obstructive pulmonary disease) ([place])   . DDD (degenerative disc disease), lumbar   . Depression   . Diverticulosis   . GERD (gastroesophageal reflux disease)   . High cholesterol   . Hypertension   . Hypothyroidism   . On home oxygen therapy    "2L at night" (08/20/2014)  . Shortness of breath dyspnea    with exertion  . Stroke [facility])     Family History: Family History  Problem Relation Age of Onset  . Diabetes Mother   . Hypertension Mother   . Lung cancer Mother   . Congestive Heart Failure Father   . Congestive Heart Failure Sister     Social History   Socioeconomic History  . Marital status: Divorced    Spouse name: Not on file  . Number of children: Not on file  . Years of education: Not on file  . Highest education level: Not on file  Occupational History  . Not on file  Tobacco Use  . Smoking status: Current Some Day Smoker    Years: 53.00    Types: Cigarettes  . Smokeless tobacco: Never Used  . Tobacco comment: 3 cigarettes in week  Substance and Sexual Activity  . Alcohol use: No  . Drug use: No  . Sexual activity: Never  Other Topics Concern  . Not on file  Social History Narrative  . Not on file   Social Determinants of Health   Financial Resource Strain:   . Difficulty of Paying Living Expenses: Not on file  Food Insecurity:   . Worried About Charity fundraiser in the Last Year: Not on file  . Ran Out of Food in the Last Year: Not on file  Transportation Needs:   . Lack of Transportation (Medical): Not on file  . Lack of Transportation (Non-Medical): Not on file  Physical Activity:    . Days of Exercise per Week: Not on file  . Minutes of Exercise per Session: Not on file  Stress:   . Feeling of Stress : Not on file  Social Connections:   . Frequency of Communication with Friends and Family: Not on file  . Frequency of Social Gatherings with Friends and Family: Not on file  . Attends Religious Services: Not on file  . Active Member of Clubs or Organizations: Not on file  . Attends Archivist Meetings: Not on file  . Marital Status: Not on file  Intimate Partner Violence:   . Fear of Current or Ex-Partner: Not on file  . Emotionally Abused: Not on file  . Physically Abused: Not  on file  . Sexually Abused: Not on file      Review of Systems  Constitutional: Positive for fatigue. Negative for activity change, chills and unexpected weight change.  HENT: Negative for congestion, postnasal drip, rhinorrhea, sneezing and sore throat.   Respiratory: Negative for cough, chest tightness, shortness of breath and wheezing.   Cardiovascular: Negative for chest pain and palpitations.  Gastrointestinal: Negative for abdominal pain, constipation, diarrhea, nausea and vomiting.  Endocrine: Negative for cold intolerance, heat intolerance, polydipsia and polyuria.  Musculoskeletal: Negative for arthralgias, back pain, joint swelling and neck pain.  Skin: Negative for rash.  Allergic/Immunologic: Negative for environmental allergies.  Neurological: Positive for headaches. Negative for dizziness, tremors and numbness.  Hematological: Negative for adenopathy. Does not bruise/bleed easily.  Psychiatric/Behavioral: Negative for behavioral problems (Depression), sleep disturbance and suicidal ideas. The patient is nervous/anxious.     Vital Signs: Ht 5\' 6"  (1.676 m)   BMI 34.86 kg/m    Observation/Objective:   The patient is alert and oriented. She is pleasant and answers all questions appropriately. Breathing is non-labored. She is in no acute distress at this  time. Patient sounds anxious on the phone throughout the visit.    Assessment/Plan: 1. Intractable episodic cluster headache Will get new carotid doppler study and CT head for further evaluation. Consider referral to neurology for further evaluation.  - US Carotid Bilateral; Future  2. Occlusion and stenosis of bilateral carotid arteries Will get new carotid doppler study - US Carotid Bilateral; Future  3. Other symptoms and signs involving the nervous system CT head for further evaluation - CT Head Wo Contrast; Future  General Counseling: Sandy Salaam understanding of the findings of today's phone visit and agrees with plan of treatment. I have discussed any further diagnostic evaluation that may be needed or ordered today. We also reviewed her medications today. she has been encouraged to call the office with any questions or concerns that should arise related to todays visit.  This patient was seen by Leretha Pol FNP Collaboration with Dr Lavera Guise as a part of collaborative care agreement  Orders Placed This Encounter  Procedures  . CT Head Wo Contrast  . US Carotid Bilateral      Time spent: 30 Minutes    Dr Lavera Guise Internal medicine

## 2019-09-20 DIAGNOSIS — G44011 Episodic cluster headache, intractable: Secondary | ICD-10-CM | POA: Insufficient documentation

## 2019-09-20 DIAGNOSIS — R29818 Other symptoms and signs involving the nervous system: Secondary | ICD-10-CM | POA: Insufficient documentation

## 2019-09-23 ENCOUNTER — Telehealth: Payer: Self-pay

## 2019-09-23 NOTE — Telephone Encounter (Signed)
Called confirmed [place] on 09/25/2019 and screened for covid. kl[facility]

## 2019-09-25 ENCOUNTER — Ambulatory Visit (INDEPENDENT_AMBULATORY_CARE_PROVIDER_SITE_OTHER): Payer: Medicare Other

## 2019-09-25 ENCOUNTER — Other Ambulatory Visit: Payer: Self-pay

## 2019-09-25 DIAGNOSIS — G44011 Episodic cluster headache, intractable: Secondary | ICD-10-CM

## 2019-09-25 DIAGNOSIS — I6523 Occlusion and stenosis of bilateral carotid arteries: Secondary | ICD-10-CM | POA: Diagnosis not present

## 2019-09-29 ENCOUNTER — Ambulatory Visit
Admission: RE | Admit: 2019-09-29 | Discharge: 2019-09-29 | Disposition: A | Discharge status: Home / Self Care | Payer: Medicare Other | Source: Ambulatory Visit | Attending: Nurse Practitioner | Admitting: Nurse Practitioner

## 2019-09-29 ENCOUNTER — Other Ambulatory Visit: Payer: Self-pay

## 2019-09-29 DIAGNOSIS — R519 Headache, unspecified: Secondary | ICD-10-CM | POA: Diagnosis not present

## 2019-09-29 DIAGNOSIS — R29818 Other symptoms and signs involving the nervous system: Secondary | ICD-10-CM | POA: Insufficient documentation

## 2019-09-30 NOTE — Progress Notes (Signed)
Chronic, large infarct.in right parietal lobe of the brain. Small vessel ischemic disease.

## 2019-09-30 NOTE — Procedures (Signed)
[place], [place] [address]  DATE OF SERVICE: September 25, 2019  CAROTID DOPPLER INTERPRETATION:  Bilateral Carotid Ultrsasound and Color Doppler Examination was performed. The RIGHT CCA shows moderate plaque in the vessel. The LEFT CCA shows moderate plaque in the vessel. There was moderate intimal thickening noted in the RIGHT carotid artery. There was moderate intimal thickening in the LEFT carotid artery.  The RIGHT CCA shows peak systolic velocity of 64 cm per second. The end diastolic velocity is 5 cm per second on the RIGHT side. The RIGHT ICA shows peak systolic velocity of 99991111 per second. RIGHT sided ICA end diastolic velocity is 9 cm per second. The RIGHT ECA shows a peak systolic velocity of [id number] cm per second. The ICA/CCA ratio is calculated to be 2.96. This suggests greater than 70% stenosis. The Vertebral Artery shows antegrade flow.  The LEFT CCA shows peak systolic velocity of 89 cm per second. The end diastolic velocity is 14 cm per second on the LEFT side. The LEFT ICA shows peak systolic velocity of Q000111Q per second. LEFT sided ICA end diastolic velocity is 18 cm per second. The LEFT ECA shows a peak systolic velocity of 123456 cm per second. The ICA/CCA ratio is calculated to be 1.50. This suggests 50 to 69% stenosis. The Vertebral Artery shows antegrade flow.   Impression:    The RIGHT CAROTID shows greater than 70% stenosis. The LEFT CAROTID shows 50 to 69% stenosis.  There is moderate plaque formation noted on the LEFT and moderate on the RIGHT  side.  CT angiogram is recommended for further evaluation or an MRA. Consider a repeat Carotid doppler if clinical situation and symptoms warrant in 6-12 months. Patient should be encouraged to change lifestyles such as smoking cessation, regular exercise and dietary modification. Use of statins in the right clinical setting and ASA is encouraged.  Allyne Gee, MD [facility]  Medicine

## 2019-10-01 NOTE — Progress Notes (Signed)
Or today

## 2019-10-01 NOTE — Progress Notes (Signed)
I would like to get her set up for CTA angio of the neck. There is moderate plaque and thickening of both carotid arteries. There is >70% stenosis on right and 50-69% stenosis on left.

## 2019-10-01 NOTE — Progress Notes (Signed)
Yes. That would be great. Just not tomorrow.

## 2019-10-06 ENCOUNTER — Encounter: Payer: Self-pay | Admitting: Nurse Practitioner

## 2019-10-06 ENCOUNTER — Ambulatory Visit (INDEPENDENT_AMBULATORY_CARE_PROVIDER_SITE_OTHER): Payer: Medicare Other | Admitting: Nurse Practitioner

## 2019-10-06 VITALS — Ht 66.0 in | Wt 217.0 lb

## 2019-10-06 DIAGNOSIS — G44011 Episodic cluster headache, intractable: Secondary | ICD-10-CM | POA: Diagnosis not present

## 2019-10-06 DIAGNOSIS — I6523 Occlusion and stenosis of bilateral carotid arteries: Secondary | ICD-10-CM | POA: Diagnosis not present

## 2019-10-06 DIAGNOSIS — Z8673 Personal history of transient ischemic attack (TIA), and cerebral infarction without residual deficits: Secondary | ICD-10-CM

## 2019-10-06 DIAGNOSIS — Z23 Encounter for immunization: Secondary | ICD-10-CM | POA: Diagnosis not present

## 2019-10-06 NOTE — Progress Notes (Signed)
[facility] [place], [place] [address]  Internal MEDICINE  Telephone Visit  Patient Name: [name] Douglas  [id number]  [id number]  Date of Service: 10/07/2019  I connected with the patient at 5:12pm by telephone and verified the patients identity using two identifiers.   I discussed the limitations, risks, security and privacy concerns of performing an evaluation and management service by telephone and the availability of in person appointments. I also discussed with the patient that there may be a patient responsible charge related to the service.  The patient expressed understanding and agrees to proceed.    Chief Complaint  Patient presents with  . Telephone Assessment  . Telephone Screen  . Hypertension  . Hyperlipidemia  . Follow-up    CT/ and U/S    The patient has been contacted via telephone for follow up visit due to concerns for spread of novel coronavirus. She presents for routine visit. She continues to have sharp headaches on the right side of her head. They are getting worse and more frequent. They do not last for a long time, but the pain is so severe, she can scrunch up and that seems to alleviate the pain. She states that she gets relief after a few minutes. She denies weakness, slurred speech, or other neruological deficits with these headaches. She does have history of CVA in the past. Is afraid these headaches may be result of or sign of new events happening. She had new head CT since her last visit. The head CT is stable. Shows chronic, large infarct of the centered within the right parietal lobe also extending to involve portions of the right temporal cortex. There is no evidence of acute intracranial hemorrhage. No new demarcated cortical infarct is identified. No evidence of intracranial mass. No midline shift or extra-axial fluid collection. Mild ill-defined hypoattenuation within the cerebral white matter is nonspecific, but consistent with  chronic small vessel ischemic disease. Stable, mild generalized parenchymal atrophy. There is also Mild generalized parenchymal atrophy and chronic small vessel ischemic disease. These findings are stable. She also had a carotid doppler study done. There is moderate plaque of both carotid arteries. There is >70% stenosis on the right and 50-69% stenosis on the left side.       Current Medication: Outpatient Encounter Medications as of 10/06/2019  Medication Sig  . acetaminophen (TYLENOL) 500 MG tablet Take 500 mg by mouth daily as needed.  Marland Kitchen atorvastatin (LIPITOR) 40 MG tablet TAKE 2 TABLETS (80 MG TOTAL) BY MOUTH DAILY AT 6PM  . calcium carbonate (OS-CAL) 1250 (500 Ca) MG chewable tablet Chew 1 tablet by mouth daily.   . clopidogrel (PLAVIX) 75 MG tablet Take 1 tablet (75 mg total) by mouth daily.  . diphenhydrAMINE HCl, Sleep, (SLEEP AID) 25 MG CAPS Take by mouth.   . docusate sodium (COLACE) 100 MG capsule Take 100 mg by mouth at bedtime.  . donepezil (ARICEPT) 5 MG tablet Take 1 tablet (5 mg total) by mouth at bedtime.  . ergocalciferol (DRISDOL) 1.25 MG (50000 UT) capsule Take 1 capsule (50,000 Units total) by mouth once a week.  . levothyroxine (SYNTHROID) 100 MCG tablet TAKE 1.5 TABLETS (150 MCG TOTAL) BY MOUTH DAILY BEFORE BREAKFAST  . metoprolol succinate (TOPROL-XL) 50 MG 24 hr tablet Take 2 tablets (100 mg total) by mouth daily.  . ondansetron (ZOFRAN) 4 MG tablet Take 1 tablet (4 mg total) by mouth every 8 (eight) hours as needed for nausea or vomiting.  Marland Kitchen  pneumococcal 23 valent vaccine (PNEUMOVAX 23) 25 MCG/0.5ML injection Inject 0.30ml IM once  . venlafaxine XR (EFFEXOR-XR) 75 MG 24 hr capsule TAKE 2 CAPSULES BY MOUTH DAILY.   No facility-administered encounter medications on file as of 10/06/2019.    Surgical History: Past Surgical History:  Procedure Laterality Date  . CARDIAC CATHETERIZATION  ~ 2007  . EXCISIONAL HEMORRHOIDECTOMY  1960's  . EYE SURGERY Bilateral     Cataract extraction with IOL  . JOINT REPLACEMENT Left 2009  . KNEE ARTHROSCOPY Left X 2 <2008  . KNEE ARTHROSCOPY WITH SUBCHONDROPLASTY Right 04/19/2016   Procedure: KNEE ARTHROSCOPY WITH SUBCHONDROPLASTY, PARTIAL MENISCECTOMY;  Surgeon: Hessie Knows, MD;  Location: [facility] ORS;  Service: Orthopedics;  Laterality: Right;  . LOOP RECORDER INSERTION N/A 01/02/2018   Procedure: LOOP RECORDER INSERTION;  Surgeon: Deboraha Sprang, MD;  Location: [facility];  Service: Cardiovascular;  Laterality: N/A;  . SHOULDER OPEN ROTATOR CUFF REPAIR Right 2001  . TONSILLECTOMY  ~ 1950  . TOTAL KNEE ARTHROPLASTY Left 2008  . TOTAL KNEE ARTHROPLASTY Right 07/03/2016   Procedure: TOTAL KNEE ARTHROPLASTY;  Surgeon: Hessie Knows, MD;  Location: [facility] ORS;  Service: Orthopedics;  Laterality: Right;  . TUBAL LIGATION  1973  . VAGINAL HYSTERECTOMY  1974    Medical History: Past Medical History:  Diagnosis Date  . Anxiety   . Arthritis    "shoulders" (08/20/2014)  . Asthma   . Chronic lower back pain   . Complication of anesthesia    "agitated & restless after knee replacement in 2008"  . COPD (chronic obstructive pulmonary disease) ([place])   . DDD (degenerative disc disease), lumbar   . Depression   . Diverticulosis   . GERD (gastroesophageal reflux disease)   . High cholesterol   . Hypertension   . Hypothyroidism   . On home oxygen therapy    "2L at night" (08/20/2014)  . Shortness of breath dyspnea    with exertion  . Stroke [facility])     Family History: Family History  Problem Relation Age of Onset  . Diabetes Mother   . Hypertension Mother   . Lung cancer Mother   . Congestive Heart Failure Father   . Congestive Heart Failure Sister     Social History   Socioeconomic History  . Marital status: Divorced    Spouse name: Not on file  . Number of children: Not on file  . Years of education: Not on file  . Highest education level: Not on file  Occupational History  . Not on file  Tobacco  Use  . Smoking status: Current Some Day Smoker    Years: 53.00    Types: Cigarettes  . Smokeless tobacco: Never Used  . Tobacco comment: 3 cigarettes in week  Substance and Sexual Activity  . Alcohol use: No  . Drug use: No  . Sexual activity: Never  Other Topics Concern  . Not on file  Social History Narrative  . Not on file   Social Determinants of Health   Financial Resource Strain:   . Difficulty of Paying Living Expenses: Not on file  Food Insecurity:   . Worried About Charity fundraiser in the Last Year: Not on file  . Ran Out of Food in the Last Year: Not on file  Transportation Needs:   . Lack of Transportation (Medical): Not on file  . Lack of Transportation (Non-Medical): Not on file  Physical Activity:   . Days of Exercise per Week: Not  on file  . Minutes of Exercise per Session: Not on file  Stress:   . Feeling of Stress : Not on file  Social Connections:   . Frequency of Communication with Friends and Family: Not on file  . Frequency of Social Gatherings with Friends and Family: Not on file  . Attends Religious Services: Not on file  . Active Member of Clubs or Organizations: Not on file  . Attends Archivist Meetings: Not on file  . Marital Status: Not on file  Intimate Partner Violence:   . Fear of Current or Ex-Partner: Not on file  . Emotionally Abused: Not on file  . Physically Abused: Not on file  . Sexually Abused: Not on file      Review of Systems  Constitutional: Positive for fatigue. Negative for activity change, chills and unexpected weight change.  HENT: Negative for congestion, postnasal drip, rhinorrhea, sneezing and sore throat.   Respiratory: Negative for cough, chest tightness, shortness of breath and wheezing.   Cardiovascular: Negative for chest pain and palpitations.  Gastrointestinal: Negative for abdominal pain, constipation, diarrhea, nausea and vomiting.  Endocrine: Negative for cold intolerance, heat intolerance,  polydipsia and polyuria.  Musculoskeletal: Negative for arthralgias, back pain, joint swelling and neck pain.  Skin: Negative for rash.  Allergic/Immunologic: Negative for environmental allergies.  Neurological: Positive for headaches. Negative for dizziness, tremors and numbness.       Intermittent, sharp headaches on the right side of the head. Have been getting more severe and more frequent with time.   Hematological: Negative for adenopathy. Does not bruise/bleed easily.  Psychiatric/Behavioral: Negative for behavioral problems (Depression), sleep disturbance and suicidal ideas. The patient is nervous/anxious.     Today's Vitals   10/06/19 1551  Weight: 217 lb (98.4 kg)  Height: 5\' 6"  (1.676 m)   Body mass index is 35.02 kg/m.  Observation/Objective:   The patient is alert and oriented. She is pleasant and answers all questions appropriately. Breathing is non-labored. She is in no acute distress at this time.    Assessment/Plan: 1. Occlusion and stenosis of bilateral carotid arteries Reviewed results of carotid doppler with the patient. There is moderate plaque in both carotid arteries. There is >70% stenosis on the right and 50-69% stenosis on the left. Will get CTA of the neck and refer to vein and vascular for further evaluation.  - CT ANGIO NECK W OR WO CONTRAST; Future - Ambulatory referral to Vascular Surgery  2. Intractable episodic cluster headache No acute findings on CT head. reviewed chronic findings with the patient. Symptoms potentially a result of carotid artery stenosis.   3. History of CVA (cerebrovascular accident) Reviewed results of CT head and carotid doppler studies with the patient. Will get CTA neck and refer to vein and vascular for further evaluation and treatment.   General Counseling: jaquanna damer understanding of the findings of today's phone visit and agrees with plan of treatment. I have discussed any further diagnostic evaluation that may be  needed or ordered today. We also reviewed her medications today. she has been encouraged to call the office with any questions or concerns that should arise related to todays visit.    Orders Placed This Encounter  Procedures  . CT ANGIO NECK W OR WO CONTRAST  . Ambulatory referral to Vascular Surgery    This patient was seen by [place] with Dr Lavera Guise as a part of collaborative care agreement  Time spent with patient included reviewing  progress notes, labs, imaging studies, and discussing plan for follow up.    Time spent: [facility]

## 2019-10-07 ENCOUNTER — Telehealth: Payer: Self-pay

## 2019-10-07 DIAGNOSIS — Z8673 Personal history of transient ischemic attack (TIA), and cerebral infarction without residual deficits: Secondary | ICD-10-CM | POA: Insufficient documentation

## 2019-10-07 NOTE — Telephone Encounter (Signed)
Pt daughter was notified. Pt daughter said that pt was supposed to remind you about her memory medications, daughter does not believe it is working. Pt daughter also asked if possible to do a three way call next time there is an appt.

## 2019-10-07 NOTE — Telephone Encounter (Signed)
I'm really not sure how to do a three way call for this. We can probably see her in the office next visit. Before changing anything with memory medications, I would like to have her evaluated per vascular. These meds don't help improve the memory. They only help to slow down the process. I may refer her to neurology after next visit. With the stroke that she suffered in the past, the memory deficits are not unexpected.

## 2019-10-07 NOTE — Telephone Encounter (Signed)
Please check HiPPA before you call. CT of the head was stable with no acute abnormalities and no new findings. Carotid doppler shows moderate plaque in both carotid arteries. There is greater than 70% stenosis (hardening) of the right carotid and 50-69% stenosis on the left. Due to this and history of prior stroke, I ordered CTA of her neck and referred her to vascular for further evaluation and treatment. Thanks.

## 2019-10-07 NOTE — Telephone Encounter (Signed)
Pt daughter was notified

## 2019-10-09 ENCOUNTER — Other Ambulatory Visit: Payer: Medicare Other

## 2019-10-12 ENCOUNTER — Other Ambulatory Visit: Payer: Self-pay

## 2019-10-12 ENCOUNTER — Ambulatory Visit (INDEPENDENT_AMBULATORY_CARE_PROVIDER_SITE_OTHER): Payer: Medicare Other | Admitting: Vascular Surgery

## 2019-10-12 ENCOUNTER — Encounter (INDEPENDENT_AMBULATORY_CARE_PROVIDER_SITE_OTHER): Payer: Self-pay | Admitting: Vascular Surgery

## 2019-10-12 VITALS — BP 203/65 | HR 75 | Resp 14 | Ht 66.0 in | Wt 207.0 lb

## 2019-10-12 DIAGNOSIS — I10 Essential (primary) hypertension: Secondary | ICD-10-CM

## 2019-10-12 DIAGNOSIS — I6523 Occlusion and stenosis of bilateral carotid arteries: Secondary | ICD-10-CM

## 2019-10-12 DIAGNOSIS — I639 Cerebral infarction, unspecified: Secondary | ICD-10-CM

## 2019-10-12 DIAGNOSIS — E039 Hypothyroidism, unspecified: Secondary | ICD-10-CM | POA: Diagnosis not present

## 2019-10-16 ENCOUNTER — Encounter (INDEPENDENT_AMBULATORY_CARE_PROVIDER_SITE_OTHER): Payer: Self-pay | Admitting: Vascular Surgery

## 2019-10-16 NOTE — Progress Notes (Signed)
MRN : [id number]  [name] Douglas is a 79 y.o. (Jul 02, 1941) female who presents with chief complaint of  Chief Complaint  Patient presents with  . New Patient (Initial Visit)    boscia. Carotid Stenosis  .  History of Present Illness:   The patient is seen for evaluation of carotid stenosis. The carotid stenosis was identified after after she sustained a CVA.  Duplex ultrasound of the carotid arteries dated 09/25/2019 shows >70% RICA  The patient interval denies amaurosis fugax. There is no recent history of TIA symptoms or focal motor deficits.   There is no history of migraine headaches. There is no history of seizures.  The patient is taking enteric-coated aspirin 81 mg daily.  The patient has a history of coronary artery disease, no recent episodes of angina or shortness of breath. The patient denies PAD or claudication symptoms. There is a history of hyperlipidemia which is being treated with a statin.    Current Meds  Medication Sig  . atorvastatin (LIPITOR) 40 MG tablet TAKE 2 TABLETS (80 MG TOTAL) BY MOUTH DAILY AT 6PM  . calcium carbonate (OS-CAL) 1250 (500 Ca) MG chewable tablet Chew 1 tablet by mouth daily.   . clopidogrel (PLAVIX) 75 MG tablet Take 1 tablet (75 mg total) by mouth daily.  . diphenhydrAMINE HCl, Sleep, (SLEEP AID) 25 MG CAPS Take by mouth.   . docusate sodium (COLACE) 100 MG capsule Take 100 mg by mouth at bedtime.  . donepezil (ARICEPT) 5 MG tablet Take 1 tablet (5 mg total) by mouth at bedtime.  Marland Kitchen levothyroxine (SYNTHROID) 100 MCG tablet TAKE 1.5 TABLETS (150 MCG TOTAL) BY MOUTH DAILY BEFORE BREAKFAST  . metoprolol succinate (TOPROL-XL) 50 MG 24 hr tablet Take 2 tablets (100 mg total) by mouth daily.  . ondansetron (ZOFRAN) 4 MG tablet Take 1 tablet (4 mg total) by mouth every 8 (eight) hours as needed for nausea or vomiting.  . pneumococcal 23 valent vaccine (PNEUMOVAX 23) 25 MCG/0.5ML injection Inject 0.40ml IM once  . venlafaxine XR (EFFEXOR-XR) 75  MG 24 hr capsule TAKE 2 CAPSULES BY MOUTH DAILY.    Past Medical History:  Diagnosis Date  . Anxiety   . Arthritis    "shoulders" (08/20/2014)  . Asthma   . Chronic lower back pain   . Complication of anesthesia    "agitated & restless after knee replacement in 2008"  . COPD (chronic obstructive pulmonary disease) ([place])   . DDD (degenerative disc disease), lumbar   . Depression   . Diverticulosis   . GERD (gastroesophageal reflux disease)   . High cholesterol   . Hypertension   . Hypothyroidism   . On home oxygen therapy    "2L at night" (08/20/2014)  . Shortness of breath dyspnea    with exertion  . Stroke [facility])     Past Surgical History:  Procedure Laterality Date  . CARDIAC CATHETERIZATION  ~ 2007  . EXCISIONAL HEMORRHOIDECTOMY  1960's  . EYE SURGERY Bilateral    Cataract extraction with IOL  . JOINT REPLACEMENT Left 2009  . KNEE ARTHROSCOPY Left X 2 <2008  . KNEE ARTHROSCOPY WITH SUBCHONDROPLASTY Right 04/19/2016   Procedure: KNEE ARTHROSCOPY WITH SUBCHONDROPLASTY, PARTIAL MENISCECTOMY;  Surgeon: Hessie Knows, MD;  Location: [facility] ORS;  Service: Orthopedics;  Laterality: Right;  . LOOP RECORDER INSERTION N/A 01/02/2018   Procedure: LOOP RECORDER INSERTION;  Surgeon: Deboraha Sprang, MD;  Location: [place] [facility];  Service: Cardiovascular;  Laterality: N/A;  .  SHOULDER OPEN ROTATOR CUFF REPAIR Right 2001  . TONSILLECTOMY  ~ 1950  . TOTAL KNEE ARTHROPLASTY Left 2008  . TOTAL KNEE ARTHROPLASTY Right 07/03/2016   Procedure: TOTAL KNEE ARTHROPLASTY;  Surgeon: Hessie Knows, MD;  Location: [facility] ORS;  Service: Orthopedics;  Laterality: Right;  . TUBAL LIGATION  1973  . VAGINAL HYSTERECTOMY  1974    Social History Social History   Tobacco Use  . Smoking status: Current Some Day Smoker    Years: 53.00    Types: Cigarettes  . Smokeless tobacco: Never Used  . Tobacco comment: 3 cigarettes in week  Substance Use Topics  . Alcohol use: No  . Drug use: No    Family  History Family History  Problem Relation Age of Onset  . Diabetes Mother   . Hypertension Mother   . Lung cancer Mother   . Congestive Heart Failure Father   . Congestive Heart Failure Sister   No family history of bleeding/clotting disorders, porphyria or autoimmune disease   Allergies  Allergen Reactions  . Heparin Nausea And Vomiting     REVIEW OF SYSTEMS (Negative unless checked)  Constitutional: [] Weight loss  [] Fever  [] Chills Cardiac: [] Chest pain   [] Chest pressure   [] Palpitations   [] Shortness of breath when laying flat   [] Shortness of breath with exertion. Vascular:  [] Pain in legs with walking   [] Pain in legs at rest  [] History of DVT   [] Phlebitis   [] Swelling in legs   [] Varicose veins   [] Non-healing ulcers Pulmonary:   [] Uses home oxygen   [] Productive cough   [] Hemoptysis   [] Wheeze  [] COPD   [] Asthma Neurologic:  [] Dizziness   [] Seizures   [x] History of stroke   [] History of TIA  [] Aphasia   [] Vissual changes   [] Weakness or numbness in arm   [] Weakness or numbness in leg Musculoskeletal:   [] Joint swelling   [] Joint pain   [] Low back pain Hematologic:  [] Easy bruising  [] Easy bleeding   [] Hypercoagulable state   [] Anemic Gastrointestinal:  [] Diarrhea   [] Vomiting  [] Gastroesophageal reflux/heartburn   [] Difficulty swallowing. Genitourinary:  [] Chronic kidney disease   [] Difficult urination  [] Frequent urination   [] Blood in urine Skin:  [] Rashes   [] Ulcers  Psychological:  [] History of anxiety   []  History of major depression.  Physical Examination  Vitals:   10/12/19 1450  BP: (!) 203/65  Pulse: 75  Resp: 14  Weight: 207 lb (93.9 kg)  Height: 5\' 6"  (1.676 m)   Body mass index is 33.41 kg/m. Gen: WD/WN, NAD Head: [place]/AT, No temporalis wasting.  Ear/Nose/Throat: Hearing grossly intact, nares w/o erythema or drainage, poor dentition Eyes: PER, EOMI, sclera nonicteric.  Neck: Supple, no masses.  No bruit or JVD.  Pulmonary:  Good air movement, clear to  auscultation bilaterally, no use of accessory muscles.  Cardiac: RRR, normal S1, S2, no Murmurs. Vascular: bilateral carotid bruits Vessel Right Left  Radial Palpable Palpable  Brachial Palpable Palpable  Carotid Palpable Palpable  Gastrointestinal: soft, non-distended. No guarding/no peritoneal signs.  Musculoskeletal: M/S 5/5 throughout.  No deformity or atrophy.  Neurologic: CN 2-12 intact. Pain and light touch intact in extremities.  Symmetrical.  Speech is fluent. Motor exam as listed above. Psychiatric: Judgment intact, Mood & affect appropriate for pt's clinical situation. Dermatologic: No rashes or ulcers noted.  No changes consistent with cellulitis.   CBC Lab Results  Component Value Date   WBC 6.8 05/07/2019   HGB 10.3 (L) 05/07/2019   HCT 31.2 (  L) 05/07/2019   MCV 80 05/07/2019   PLT 262 05/07/2019    BMET    Component Value Date/Time   NA 142 05/07/2019 1409   NA 143 08/04/2014 0356   K 4.3 05/07/2019 1409   K 4.0 08/07/2014 1123   CL 103 05/07/2019 1409   CL 110 (H) 08/04/2014 0356   CO2 26 05/07/2019 1409   CO2 28 08/04/2014 0356   GLUCOSE 96 05/07/2019 1409   GLUCOSE 103 (H) 12/20/2017 1103   GLUCOSE 99 08/04/2014 0356   BUN 15 05/07/2019 1409   BUN 3 (L) 08/04/2014 0356   CREATININE 0.96 05/07/2019 1409   CREATININE 0.69 08/08/2014 0358   CALCIUM 9.2 05/07/2019 1409   CALCIUM 7.7 (L) 08/04/2014 0356   GFRNONAA 56 (L) 05/07/2019 1409   GFRNONAA >60 08/08/2014 0358   GFRNONAA >60 08/23/2013 1439   GFRAA 65 05/07/2019 1409   GFRAA >60 08/08/2014 0358   GFRAA >60 08/23/2013 1439   CrCl cannot be calculated (Patient's most recent lab result is older than the maximum 21 days allowed.).  COAG Lab Results  Component Value Date   INR 0.97 12/20/2017   INR 0.94 12/08/2016   INR 1.05 08/22/2016    Radiology CT Head Wo Contrast  Result Date: 09/30/2019 CLINICAL DATA:  Other symptoms and signs involving the nervous system. Headache, acute, normal  neuro exam; neuro deficit, subacute. Additional history provided by scanning technologist: Patient reports headaches on right side for several years but more frequent over the past few weeks, confusion occurring with headaches, history of multiple strokes EXAM: CT HEAD WITHOUT CONTRAST TECHNIQUE: Contiguous axial images were obtained from the base of the skull through the vertex without intravenous contrast. COMPARISON:  Brain MRI 12/20/2017, head CT 12/20/2017 FINDINGS: Brain: Redemonstrated large now chronic infarct centered within the right parietal lobe also extending to involve portions of the right temporal cortex. There is no evidence of acute intracranial hemorrhage. No new demarcated cortical infarct is identified. No evidence of intracranial mass. No midline shift or extra-axial fluid collection. Mild ill-defined hypoattenuation within the cerebral white matter is nonspecific, but consistent with chronic small vessel ischemic disease. Stable, mild generalized parenchymal atrophy. Vascular: No hyperdense vessel.  Atherosclerotic calcifications. Skull: Normal. Negative for fracture or focal lesion. Sinuses/Orbits: Visualized orbits demonstrate no acute abnormality. No significant paranasal sinus disease or mastoid effusion at the imaged levels. IMPRESSION: No evidence of acute intracranial abnormality. Redemonstrated large, now chronic, infarct centered within the right parietal lobe. Mild generalized parenchymal atrophy and chronic small vessel ischemic disease, stable. Electronically Signed   By: Kellie Simmering DO   On: 09/30/2019 07:20   US Carotid Bilateral  Result Date: 09/25/2019 Allyne Gee, MD     09/30/2019  8:15 PM [place], [place] [address] DATE OF SERVICE: September 25, 2019 CAROTID DOPPLER INTERPRETATION: Bilateral Carotid Ultrsasound and Color Doppler Examination was performed. The RIGHT CCA shows moderate plaque in the vessel. The LEFT CCA shows moderate  plaque in the vessel. There was moderate intimal thickening noted in the RIGHT carotid artery. There was moderate intimal thickening in the LEFT carotid artery. The RIGHT CCA shows peak systolic velocity of 64 cm per second. The end diastolic velocity is 5 cm per second on the RIGHT side. The RIGHT ICA shows peak systolic velocity of [id number] per second. RIGHT sided ICA end diastolic velocity is 9 cm per second. The RIGHT ECA shows a peak systolic velocity of [id number] cm per second. The  ICA/CCA ratio is calculated to be 2.96. This suggests greater than 70% stenosis. The Vertebral Artery shows antegrade flow. The LEFT CCA shows peak systolic velocity of 89 cm per second. The end diastolic velocity is 14 cm per second on the LEFT side. The LEFT ICA shows peak systolic velocity of [id number] per second. LEFT sided ICA end diastolic velocity is 18 cm per second. The LEFT ECA shows a peak systolic velocity of 123456 cm per second. The ICA/CCA ratio is calculated to be 1.50. This suggests 50 to 69% stenosis. The Vertebral Artery shows antegrade flow. Impression:  The RIGHT CAROTID shows greater than 70% stenosis. The LEFT CAROTID shows 50 to 69% stenosis.  There is moderate plaque formation noted on the LEFT and moderate on the RIGHT  side.  CT angiogram is recommended for further evaluation or an MRA. Consider a repeat Carotid doppler if clinical situation and symptoms warrant in 6-12 months. Patient should be encouraged to change lifestyles such as smoking cessation, regular exercise and dietary modification. Use of statins in the right clinical setting and ASA is encouraged. Allyne Gee, MD [facility] Critical [facility] Medicine   CUP PACEART REMOTE DEVICE CHECK  Result Date: 09/17/2019 Carelink summary report received. Battery status OK. Normal device function. No new symptom episodes, tachy episodes, brady, or pause episodes. No new AF episodes. Monthly summary reports and ROV/PRN Kathy Breach, RN, CCDS, [employer]     Assessment/Plan 1. Occlusion and stenosis of bilateral carotid arteries The patient remains asymptomatic with respect to the carotid stenosis.  However, the patient has now progressed and has a lesion the is >70%.  Patient should undergo CT angiography of the carotid arteries to define the degree of stenosis of the internal carotid arteries bilaterally and the anatomic suitability for surgery vs. intervention.  If the patient does indeed need surgery cardiac clearance will be required, once cleared the patient will be scheduled for surgery.  The risks, benefits and alternative therapies were reviewed in detail with the patient.  All questions were answered.  The patient agrees to proceed with imaging.  Continue antiplatelet therapy as prescribed. Continue management of CAD, HTN and Hyperlipidemia. Healthy heart diet, encouraged exercise at least 4 times per week.    2. Acute CVA (cerebrovascular accident) ([place]) Continue antiplatelet therapy  3. Essential hypertension Continue antihypertensive medications as already ordered, these medications have been reviewed and there are no changes at this time.   4. Acquired hypothyroidism Continue thyroid replacement as already ordered, these medications have been reviewed and there are no changes at this time.    Hortencia Pilar, MD  10/16/2019 10:33 AM

## 2019-10-18 ENCOUNTER — Ambulatory Visit (INDEPENDENT_AMBULATORY_CARE_PROVIDER_SITE_OTHER): Payer: Medicare Other | Admitting: *Deleted

## 2019-10-18 DIAGNOSIS — I639 Cerebral infarction, unspecified: Secondary | ICD-10-CM

## 2019-10-18 LAB — CUP PACEART REMOTE DEVICE CHECK
Date Time Interrogation Session: 20210228023352
Implantable Pulse Generator Implant Date: 20190516

## 2019-10-18 NOTE — Progress Notes (Signed)
ILR remote 

## 2019-10-19 ENCOUNTER — Ambulatory Visit
Admission: RE | Admit: 2019-10-19 | Discharge: 2019-10-19 | Disposition: A | Discharge status: Home / Self Care | Payer: Medicare Other | Source: Ambulatory Visit | Attending: Nurse Practitioner | Admitting: Nurse Practitioner

## 2019-10-19 ENCOUNTER — Ambulatory Visit: Payer: Medicare Other | Admitting: Nurse Practitioner

## 2019-10-19 ENCOUNTER — Other Ambulatory Visit: Payer: Self-pay

## 2019-10-19 DIAGNOSIS — I6523 Occlusion and stenosis of bilateral carotid arteries: Secondary | ICD-10-CM | POA: Insufficient documentation

## 2019-10-19 LAB — POCT I-STAT CREATININE: Creatinine, Ser: 0.9 mg/dL (ref 0.44–1.00)

## 2019-10-19 MED ORDER — IOHEXOL 350 MG/ML SOLN
75.0000 mL | Freq: Once | INTRAVENOUS | Status: AC | PRN
  Administered 2019-10-19: 75 mL via INTRAVENOUS

## 2019-10-19 NOTE — Progress Notes (Signed)
Recommend visit to vascular for further evaluation.

## 2019-10-22 ENCOUNTER — Other Ambulatory Visit: Payer: Self-pay

## 2019-10-22 ENCOUNTER — Telehealth: Payer: Self-pay

## 2019-10-22 ENCOUNTER — Ambulatory Visit (INDEPENDENT_AMBULATORY_CARE_PROVIDER_SITE_OTHER): Payer: Medicare Other | Admitting: Vascular Surgery

## 2019-10-22 ENCOUNTER — Encounter (INDEPENDENT_AMBULATORY_CARE_PROVIDER_SITE_OTHER): Payer: Self-pay | Admitting: Vascular Surgery

## 2019-10-22 VITALS — BP 179/75 | HR 82 | Resp 16 | Wt 205.6 lb

## 2019-10-22 DIAGNOSIS — I10 Essential (primary) hypertension: Secondary | ICD-10-CM

## 2019-10-22 DIAGNOSIS — I639 Cerebral infarction, unspecified: Secondary | ICD-10-CM | POA: Diagnosis not present

## 2019-10-22 DIAGNOSIS — I6523 Occlusion and stenosis of bilateral carotid arteries: Secondary | ICD-10-CM | POA: Diagnosis not present

## 2019-10-22 DIAGNOSIS — J449 Chronic obstructive pulmonary disease, unspecified: Secondary | ICD-10-CM

## 2019-10-22 DIAGNOSIS — E7800 Pure hypercholesterolemia, unspecified: Secondary | ICD-10-CM | POA: Diagnosis not present

## 2019-10-22 NOTE — Telephone Encounter (Signed)
CONFIRMED AND SCREENED FOR 10-26-19 OV.

## 2019-10-26 ENCOUNTER — Encounter: Payer: Self-pay | Admitting: Nurse Practitioner

## 2019-10-26 ENCOUNTER — Ambulatory Visit (INDEPENDENT_AMBULATORY_CARE_PROVIDER_SITE_OTHER): Payer: Medicare Other | Admitting: Nurse Practitioner

## 2019-10-26 ENCOUNTER — Other Ambulatory Visit: Payer: Self-pay

## 2019-10-26 VITALS — BP 198/88 | HR 77 | Temp 97.3°F | Resp 16 | Ht 66.0 in | Wt 207.4 lb

## 2019-10-26 DIAGNOSIS — G44011 Episodic cluster headache, intractable: Secondary | ICD-10-CM

## 2019-10-26 DIAGNOSIS — I10 Essential (primary) hypertension: Secondary | ICD-10-CM | POA: Diagnosis not present

## 2019-10-26 DIAGNOSIS — L209 Atopic dermatitis, unspecified: Secondary | ICD-10-CM

## 2019-10-26 DIAGNOSIS — I6523 Occlusion and stenosis of bilateral carotid arteries: Secondary | ICD-10-CM | POA: Diagnosis not present

## 2019-10-26 MED ORDER — EUCRISA 2 % EX OINT: 1.0000 "application " | TOPICAL_OINTMENT | Freq: Two times a day (BID) | CUTANEOUS | 1 refills | Status: DC

## 2019-10-26 MED ORDER — LOSARTAN POTASSIUM 25 MG PO TABS: 25.0000 mg | ORAL_TABLET | Freq: Every day | ORAL | 2 refills | Status: DC

## 2019-10-26 NOTE — Progress Notes (Signed)
[facility] [place], [place] [address]  Internal MEDICINE  Office Visit Note  Patient Name: [name] Douglas  [id number]  PT:[id number]  Date of Service: 10/28/2019  Chief Complaint  Patient presents with  . Follow-up    review CTA  . Hypertension    bp has been staying elevated at other doctor's office her top number is in the 200's, around 202/203  . Dizziness    saturday had a dizzy spell, feels like she was going to pass out     The patient is here for routine follow up. Blood pressure very elevated today. Continues to have severe and frequent headaches. She had very bad dizzy spell on Saturday. She was unable to stand without assistance. Had to get her son-in-law to take her home as she was unable to drive. Took nearly 24 hours to return to normal.  She has seen Dr. Ronalee Belts, as carotid doppler study did show significant carotid artery stenosis. CTA of the neck indicates There is no significant carotid bifurcation stenosis despite combined soft and calcified plaque bilaterally. However, bulky calcified plaque does result in 70% stenosis of the Left CCA at the thoracic inlet.He does not believe she needs surgical repair at this time.  She is currently on metoprolol ER 100mg  daily.  Patient has a few spots of itchy plaques on her skin of the face. There is area on left cheek, on the forehead, and on the bridge of her nose.       Current Medication: Outpatient Encounter Medications as of 10/26/2019  Medication Sig  . acetaminophen (TYLENOL) 500 MG tablet Take 500 mg by mouth daily as needed.  Marland Kitchen atorvastatin (LIPITOR) 40 MG tablet TAKE 2 TABLETS (80 MG TOTAL) BY MOUTH DAILY AT 6PM  . calcium carbonate (OS-CAL) 1250 (500 Ca) MG chewable tablet Chew 1 tablet by mouth daily.   . clopidogrel (PLAVIX) 75 MG tablet Take 1 tablet (75 mg total) by mouth daily.  . diphenhydrAMINE HCl, Sleep, (SLEEP AID) 25 MG CAPS Take by mouth.   . docusate sodium (COLACE) 100 MG  capsule Take 100 mg by mouth at bedtime.  . donepezil (ARICEPT) 5 MG tablet Take 1 tablet (5 mg total) by mouth at bedtime.  . ergocalciferol (DRISDOL) 1.25 MG (50000 UT) capsule Take 1 capsule (50,000 Units total) by mouth once a week.  . levothyroxine (SYNTHROID) 100 MCG tablet TAKE 1.5 TABLETS (150 MCG TOTAL) BY MOUTH DAILY BEFORE BREAKFAST  . metoprolol succinate (TOPROL-XL) 50 MG 24 hr tablet Take 2 tablets (100 mg total) by mouth daily.  . ondansetron (ZOFRAN) 4 MG tablet Take 1 tablet (4 mg total) by mouth every 8 (eight) hours as needed for nausea or vomiting.  . pneumococcal 23 valent vaccine (PNEUMOVAX 23) 25 MCG/0.5ML injection Inject 0.81ml IM once  . venlafaxine XR (EFFEXOR-XR) 75 MG 24 hr capsule TAKE 2 CAPSULES BY MOUTH DAILY.  Stasia Cavalier (EUCRISA) 2 % OINT Apply 1 application topically 2 (two) times daily.  Marland Kitchen losartan (COZAAR) 25 MG tablet Take 1 tablet (25 mg total) by mouth daily.   No facility-administered encounter medications on file as of 10/26/2019.    Surgical History: Past Surgical History:  Procedure Laterality Date  . CARDIAC CATHETERIZATION  ~ 2007  . EXCISIONAL HEMORRHOIDECTOMY  1960's  . EYE SURGERY Bilateral    Cataract extraction with IOL  . JOINT REPLACEMENT Left 2009  . KNEE ARTHROSCOPY Left X 2 <2008  . KNEE ARTHROSCOPY WITH SUBCHONDROPLASTY Right 04/19/2016  Procedure: KNEE ARTHROSCOPY WITH SUBCHONDROPLASTY, PARTIAL MENISCECTOMY;  Surgeon: Hessie Knows, MD;  Location: [facility];  Service: Orthopedics;  Laterality: Right;  . LOOP RECORDER INSERTION N/A 01/02/2018   Procedure: LOOP RECORDER INSERTION;  Surgeon: Deboraha Sprang, MD;  Location: [place] [facility];  Service: Cardiovascular;  Laterality: N/A;  . SHOULDER OPEN ROTATOR CUFF REPAIR Right 2001  . TONSILLECTOMY  ~ 1950  . TOTAL KNEE ARTHROPLASTY Left 2008  . TOTAL KNEE ARTHROPLASTY Right 07/03/2016   Procedure: TOTAL KNEE ARTHROPLASTY;  Surgeon: Hessie Knows, MD;  Location: [facility];  Service:  Orthopedics;  Laterality: Right;  . TUBAL LIGATION  1973  . VAGINAL HYSTERECTOMY  1974    Medical History: Past Medical History:  Diagnosis Date  . Anxiety   . Arthritis    "shoulders" (08/20/2014)  . Asthma   . Chronic lower back pain   . Complication of anesthesia    "agitated & restless after knee replacement in 2008"  . COPD (chronic obstructive pulmonary disease) ([place])   . DDD (degenerative disc disease), lumbar   . Depression   . Diverticulosis   . GERD (gastroesophageal reflux disease)   . High cholesterol   . Hypertension   . Hypothyroidism   . On home oxygen therapy    "2L at night" (08/20/2014)  . Shortness of breath dyspnea    with exertion  . Stroke [facility])     Family History: Family History  Problem Relation Age of Onset  . Diabetes Mother   . Hypertension Mother   . Lung cancer Mother   . Congestive Heart Failure Father   . Congestive Heart Failure Sister     Social History   Socioeconomic History  . Marital status: Divorced    Spouse name: Not on file  . Number of children: Not on file  . Years of education: Not on file  . Highest education level: Not on file  Occupational History  . Not on file  Tobacco Use  . Smoking status: Current Some Day Smoker    Years: 53.00    Types: Cigarettes  . Smokeless tobacco: Never Used  . Tobacco comment: 3 cigarettes in week  Substance and Sexual Activity  . Alcohol use: No  . Drug use: No  . Sexual activity: Never  Other Topics Concern  . Not on file  Social History Narrative  . Not on file   Social Determinants of Health   Financial Resource Strain:   . Difficulty of Paying Living Expenses: Not on file  Food Insecurity:   . Worried About Charity fundraiser in the Last Year: Not on file  . Ran Out of Food in the Last Year: Not on file  Transportation Needs:   . Lack of Transportation (Medical): Not on file  . Lack of Transportation (Non-Medical): Not on file  Physical Activity:   . Days of  Exercise per Week: Not on file  . Minutes of Exercise per Session: Not on file  Stress:   . Feeling of Stress : Not on file  Social Connections:   . Frequency of Communication with Friends and Family: Not on file  . Frequency of Social Gatherings with Friends and Family: Not on file  . Attends Religious Services: Not on file  . Active Member of Clubs or Organizations: Not on file  . Attends Archivist Meetings: Not on file  . Marital Status: Not on file  Intimate Partner Violence:   . Fear of Current or Ex-Partner:  Not on file  . Emotionally Abused: Not on file  . Physically Abused: Not on file  . Sexually Abused: Not on file      Review of Systems  Constitutional: Positive for fatigue. Negative for activity change, chills and unexpected weight change.  HENT: Negative for congestion, postnasal drip, rhinorrhea, sneezing and sore throat.   Respiratory: Negative for cough, chest tightness, shortness of breath and wheezing.   Cardiovascular: Negative for chest pain and palpitations.       Severely elevated blood pressure today.   Gastrointestinal: Negative for abdominal pain, constipation, diarrhea, nausea and vomiting.  Endocrine: Negative for cold intolerance, heat intolerance, polydipsia and polyuria.  Musculoskeletal: Negative for arthralgias, back pain, joint swelling and neck pain.  Skin: Negative for rash.  Allergic/Immunologic: Negative for environmental allergies.  Neurological: Positive for weakness and headaches. Negative for dizziness, tremors and numbness.       Intermittent, sharp headaches on the right side of the head. Have been getting more severe and more frequent with time.   Hematological: Negative for adenopathy. Does not bruise/bleed easily.  Psychiatric/Behavioral: Negative for behavioral problems (Depression), sleep disturbance and suicidal ideas. The patient is nervous/anxious.    Today's Vitals   10/26/19 1519  BP: (!) 198/88  Pulse: 77  Resp:  16  Temp: (!) 97.3 F (36.3 C)  SpO2: 94%  Weight: 207 lb 6.4 oz (94.1 kg)  Height: 5\' 6"  (1.676 m)   Body mass index is 33.48 kg/m.  Physical Exam Vitals and nursing note reviewed.  Constitutional:      Appearance: Normal appearance.  HENT:     Head: Normocephalic and atraumatic.  Eyes:     Pupils: Pupils are equal, round, and reactive to light.  Neck:     Vascular: Carotid bruit present.  Cardiovascular:     Rate and Rhythm: Normal rate and regular rhythm.     Heart sounds: Normal heart sounds.  Pulmonary:     Effort: Pulmonary effort is normal.     Breath sounds: Normal breath sounds.  Abdominal:     Palpations: Abdomen is soft.  Musculoskeletal:        General: Normal range of motion.     Cervical back: Normal range of motion and neck supple.  Skin:    General: Skin is warm and dry.     Comments: Small plaques of eczema present on the left side of nose, bridge of nose, and on forehead. Mildly erythematous. Skin intact with no drainage present.   Neurological:     Mental Status: She is alert and oriented to person, place, and time. Mental status is at baseline.  Psychiatric:        Mood and Affect: Mood normal.        Behavior: Behavior normal.        Thought Content: Thought content normal.        Judgment: Judgment normal.    Assessment/Plan: 1. Essential hypertension Add losartan to blood pressure medication. Advised she limit salt in the diet and increase fluids. Continue metoprolol as prescribed  - losartan (COZAAR) 25 MG tablet; Take 1 tablet (25 mg total) by mouth daily.  Dispense: 30 tablet; Refill: 2  2. Intractable episodic cluster headache Concern that cause from previous CVS. Refer to neurology for further evaluation and treatment.  - Ambulatory referral to Neurology  3. Occlusion and stenosis of bilateral carotid arteries Patient now followed and treated per vein and vascular.   4. Atopic dermatitis, unspecified type Add eucrisa  ointment. Apply  small amount to affected areas twice daily as needed. Samples provided today. Will send prescription if effective.  - Crisaborole (EUCRISA) 2 % OINT; Apply 1 application topically 2 (two) times daily.  Dispense: 100 g; Refill: 1  General Counseling: Anjelika verbalizes understanding of the findings of todays visit and agrees with plan of treatment. I have discussed any further diagnostic evaluation that may be needed or ordered today. We also reviewed her medications today. she has been encouraged to call the office with any questions or concerns that should arise related to todays visit.  Hypertension Counseling:   The following hypertensive lifestyle modification were recommended and discussed:  1. Limiting alcohol intake to less than 1 oz/day of ethanol:(24 oz of beer or 8 oz of wine or 2 oz of 100-proof whiskey). 2. Take baby ASA 81 mg daily. 3. Importance of regular aerobic exercise and losing weight. 4. Reduce dietary saturated fat and cholesterol intake for overall cardiovascular health. 5. Maintaining adequate dietary potassium, calcium, and magnesium intake. 6. Regular monitoring of the blood pressure. 7. Reduce sodium intake to less than 100 mmol/day (less than 2.3 gm of sodium or less than 6 gm of sodium choride)   This patient was seen by [place] with Dr Lavera Guise as a part of collaborative care agreement  Orders Placed This Encounter  Procedures  . Ambulatory referral to Neurology    Meds ordered this encounter  Medications  . losartan (COZAAR) 25 MG tablet    Sig: Take 1 tablet (25 mg total) by mouth daily.    Dispense:  30 tablet    Refill:  2    Order Specific Question:   Supervising Provider    Answer:   Lavera Guise [id number]  . Crisaborole (EUCRISA) 2 % OINT    Sig: Apply 1 application topically 2 (two) times daily.    Dispense:  100 g    Refill:  1    Samples provided to patient    Order Specific Question:   Supervising Provider     Answer:   Lavera Guise [id number]    Total time spent: 30 Minutes   Time spent includes review of chart, medications, test results, and follow up plan with the patient.      Dr Lavera Guise Internal medicine

## 2019-10-26 NOTE — Progress Notes (Signed)
MRN : [id number]  [name] Douglas is a 79 y.o. (Dec 29, 1940) female who presents with chief complaint of  Chief Complaint  Patient presents with  . Follow-up  .  History of Present Illness:   The patient is seen for follow up evaluation of carotid stenosis status post CT angiogram. CT scan. Patient reports that the test went well with no problems or complications.   The patient denies interval amaurosis fugax. There is no recent or interval TIA symptoms or focal motor deficits. There is no prior documented CVA.  The patient is taking enteric-coated aspirin 81 mg daily.  There is no history of migraine headaches. There is no history of seizures.  The patient has a history of coronary artery disease, no recent episodes of angina or shortness of breath. The patient denies PAD or claudication symptoms. There is a history of hyperlipidemia which is being treated with a statin.    CT angiogram is reviewed by me personally and shows moderate RICA and a 60% left common carotid lesion.   Current Meds  Medication Sig  . acetaminophen (TYLENOL) 500 MG tablet Take 500 mg by mouth daily as needed.  Marland Kitchen atorvastatin (LIPITOR) 40 MG tablet TAKE 2 TABLETS (80 MG TOTAL) BY MOUTH DAILY AT 6PM  . calcium carbonate (OS-CAL) 1250 (500 Ca) MG chewable tablet Chew 1 tablet by mouth daily.   . clopidogrel (PLAVIX) 75 MG tablet Take 1 tablet (75 mg total) by mouth daily.  . diphenhydrAMINE HCl, Sleep, (SLEEP AID) 25 MG CAPS Take by mouth.   . docusate sodium (COLACE) 100 MG capsule Take 100 mg by mouth at bedtime.  . donepezil (ARICEPT) 5 MG tablet Take 1 tablet (5 mg total) by mouth at bedtime.  Marland Kitchen levothyroxine (SYNTHROID) 100 MCG tablet TAKE 1.5 TABLETS (150 MCG TOTAL) BY MOUTH DAILY BEFORE BREAKFAST  . metoprolol succinate (TOPROL-XL) 50 MG 24 hr tablet Take 2 tablets (100 mg total) by mouth daily.  . ondansetron (ZOFRAN) 4 MG tablet Take 1 tablet (4 mg total) by mouth every 8 (eight) hours as needed  for nausea or vomiting.  . pneumococcal 23 valent vaccine (PNEUMOVAX 23) 25 MCG/0.5ML injection Inject 0.67ml IM once  . venlafaxine XR (EFFEXOR-XR) 75 MG 24 hr capsule TAKE 2 CAPSULES BY MOUTH DAILY.    Past Medical History:  Diagnosis Date  . Anxiety   . Arthritis    "shoulders" (08/20/2014)  . Asthma   . Chronic lower back pain   . Complication of anesthesia    "agitated & restless after knee replacement in 2008"  . COPD (chronic obstructive pulmonary disease) ([place])   . DDD (degenerative disc disease), lumbar   . Depression   . Diverticulosis   . GERD (gastroesophageal reflux disease)   . High cholesterol   . Hypertension   . Hypothyroidism   . On home oxygen therapy    "2L at night" (08/20/2014)  . Shortness of breath dyspnea    with exertion  . Stroke [facility])     Past Surgical History:  Procedure Laterality Date  . CARDIAC CATHETERIZATION  ~ 2007  . EXCISIONAL HEMORRHOIDECTOMY  1960's  . EYE SURGERY Bilateral    Cataract extraction with IOL  . JOINT REPLACEMENT Left 2009  . KNEE ARTHROSCOPY Left X 2 <2008  . KNEE ARTHROSCOPY WITH SUBCHONDROPLASTY Right 04/19/2016   Procedure: KNEE ARTHROSCOPY WITH SUBCHONDROPLASTY, PARTIAL MENISCECTOMY;  Surgeon: Hessie Knows, MD;  Location: [facility] ORS;  Service: Orthopedics;  Laterality: Right;  . LOOP RECORDER  INSERTION N/A 01/02/2018   Procedure: LOOP RECORDER INSERTION;  Surgeon: Deboraha Sprang, MD;  Location: [place] [facility];  Service: Cardiovascular;  Laterality: N/A;  . SHOULDER OPEN ROTATOR CUFF REPAIR Right 2001  . TONSILLECTOMY  ~ 1950  . TOTAL KNEE ARTHROPLASTY Left 2008  . TOTAL KNEE ARTHROPLASTY Right 07/03/2016   Procedure: TOTAL KNEE ARTHROPLASTY;  Surgeon: Hessie Knows, MD;  Location: [facility] ORS;  Service: Orthopedics;  Laterality: Right;  . TUBAL LIGATION  1973  . VAGINAL HYSTERECTOMY  1974    Social History Social History   Tobacco Use  . Smoking status: Current Some Day Smoker    Years: 53.00    Types:  Cigarettes  . Smokeless tobacco: Never Used  . Tobacco comment: 3 cigarettes in week  Substance Use Topics  . Alcohol use: No  . Drug use: No    Family History Family History  Problem Relation Age of Onset  . Diabetes Mother   . Hypertension Mother   . Lung cancer Mother   . Congestive Heart Failure Father   . Congestive Heart Failure Sister     Allergies  Allergen Reactions  . Heparin Nausea And Vomiting     REVIEW OF SYSTEMS (Negative unless checked)  Constitutional: [] Weight loss  [] Fever  [] Chills Cardiac: [] Chest pain   [] Chest pressure   [] Palpitations   [] Shortness of breath when laying flat   [] Shortness of breath with exertion. Vascular:  [] Pain in legs with walking   [] Pain in legs at rest  [] History of DVT   [] Phlebitis   [] Swelling in legs   [] Varicose veins   [] Non-healing ulcers Pulmonary:   [] Uses home oxygen   [] Productive cough   [] Hemoptysis   [] Wheeze  [] COPD   [] Asthma Neurologic:  [] Dizziness   [] Seizures   [x] History of stroke   [x] History of TIA  [] Aphasia   [] Vissual changes   [] Weakness or numbness in arm   [] Weakness or numbness in leg Musculoskeletal:   [] Joint swelling   [] Joint pain   [] Low back pain Hematologic:  [] Easy bruising  [] Easy bleeding   [] Hypercoagulable state   [] Anemic Gastrointestinal:  [] Diarrhea   [] Vomiting  [] Gastroesophageal reflux/heartburn   [] Difficulty swallowing. Genitourinary:  [] Chronic kidney disease   [] Difficult urination  [] Frequent urination   [] Blood in urine Skin:  [] Rashes   [] Ulcers  Psychological:  [] History of anxiety   []  History of major depression.  Physical Examination  Vitals:   10/22/19 1609  BP: (!) 179/75  Pulse: 82  Resp: 16  Weight: 205 lb 9.6 oz (93.3 kg)   Body mass index is 33.18 kg/m. Gen: WD/WN, NAD Head: [place]/AT, No temporalis wasting.  Ear/Nose/Throat: Hearing grossly intact, nares w/o erythema or drainage Eyes: PER, EOMI, sclera nonicteric.  Neck: Supple, no large masses.     Pulmonary:  Good air movement, no audible wheezing bilaterally, no use of accessory muscles.  Cardiac: RRR, no JVD Vascular: bilateral carotid bruit Vessel Right Left  Radial Palpable Palpable  Brachial Palpable Palpable  Carotid Palpable Palpable  Gastrointestinal: Non-distended. No guarding/no peritoneal signs.  Musculoskeletal: M/S 5/5 throughout.  No deformity or atrophy.  Neurologic: CN 2-12 intact. Symmetrical.  Speech is fluent. Motor exam as listed above. Psychiatric: Judgment intact, Mood & affect appropriate for pt's clinical situation. Dermatologic: No rashes or ulcers noted.  No changes consistent with cellulitis.  CBC Lab Results  Component Value Date   WBC 6.8 05/07/2019   HGB 10.3 (L) 05/07/2019   HCT 31.2 (L) 05/07/2019  MCV 80 05/07/2019   PLT 262 05/07/2019    BMET    Component Value Date/Time   NA 142 05/07/2019 1409   NA 143 08/04/2014 0356   K 4.3 05/07/2019 1409   K 4.0 08/07/2014 1123   CL 103 05/07/2019 1409   CL 110 (H) 08/04/2014 0356   CO2 26 05/07/2019 1409   CO2 28 08/04/2014 0356   GLUCOSE 96 05/07/2019 1409   GLUCOSE 103 (H) 12/20/2017 1103   GLUCOSE 99 08/04/2014 0356   BUN 15 05/07/2019 1409   BUN 3 (L) 08/04/2014 0356   CREATININE 0.90 10/19/2019 1439   CREATININE 0.69 08/08/2014 0358   CALCIUM 9.2 05/07/2019 1409   CALCIUM 7.7 (L) 08/04/2014 0356   GFRNONAA 56 (L) 05/07/2019 1409   GFRNONAA >60 08/08/2014 0358   GFRNONAA >60 08/23/2013 1439   GFRAA 65 05/07/2019 1409   GFRAA >60 08/08/2014 0358   GFRAA >60 08/23/2013 1439   Estimated Creatinine Clearance: 57.4 mL/min (by C-G formula based on SCr of 0.9 mg/dL).  COAG Lab Results  Component Value Date   INR 0.97 12/20/2017   INR 0.94 12/08/2016   INR 1.05 08/22/2016    Radiology CT Head Wo Contrast  Result Date: 09/30/2019 CLINICAL DATA:  Other symptoms and signs involving the nervous system. Headache, acute, normal neuro exam; neuro deficit, subacute. Additional  history provided by scanning technologist: Patient reports headaches on right side for several years but more frequent over the past few weeks, confusion occurring with headaches, history of multiple strokes EXAM: CT HEAD WITHOUT CONTRAST TECHNIQUE: Contiguous axial images were obtained from the base of the skull through the vertex without intravenous contrast. COMPARISON:  Brain MRI 12/20/2017, head CT 12/20/2017 FINDINGS: Brain: Redemonstrated large now chronic infarct centered within the right parietal lobe also extending to involve portions of the right temporal cortex. There is no evidence of acute intracranial hemorrhage. No new demarcated cortical infarct is identified. No evidence of intracranial mass. No midline shift or extra-axial fluid collection. Mild ill-defined hypoattenuation within the cerebral white matter is nonspecific, but consistent with chronic small vessel ischemic disease. Stable, mild generalized parenchymal atrophy. Vascular: No hyperdense vessel.  Atherosclerotic calcifications. Skull: Normal. Negative for fracture or focal lesion. Sinuses/Orbits: Visualized orbits demonstrate no acute abnormality. No significant paranasal sinus disease or mastoid effusion at the imaged levels. IMPRESSION: No evidence of acute intracranial abnormality. Redemonstrated large, now chronic, infarct centered within the right parietal lobe. Mild generalized parenchymal atrophy and chronic small vessel ischemic disease, stable. Electronically Signed   By: Kellie Simmering DO   On: 09/30/2019 07:20   CT ANGIO NECK W OR WO CONTRAST  Result Date: 10/19/2019 CLINICAL DATA:  79 year old female with history of cerebral infarcts, carotid artery atherosclerosis. EXAM: CT ANGIOGRAPHY NECK TECHNIQUE: Multidetector CT imaging of the neck was performed using the standard protocol during bolus administration of intravenous contrast. Multiplanar CT image reconstructions and MIPs were obtained to evaluate the vascular anatomy.  Carotid stenosis measurements (when applicable) are obtained utilizing NASCET criteria, using the distal internal carotid diameter as the denominator. CONTRAST:  78mL OMNIPAQUE IOHEXOL 350 MG/ML SOLN COMPARISON:  Carotid Doppler ultrasound 12/20/2017. CTA head and neck 08/22/2016. FINDINGS: Skeleton: Absent dentition. Cervical spine degeneration appears stable since 2018. No acute osseous abnormality identified. Visualized paranasal sinuses and mastoids are stable and well pneumatized. Upper chest: Emphysema and mild upper lung scarring. Resolved patchy 12 mm opacity seen on the 2018 CTA. Partially visible left chest wall cardiac loop recorder. No superior mediastinal  lymphadenopathy. Other neck: Negative. Aortic arch: Calcified aortic atherosclerosis. Three vessel arch configuration. Chronic proximal great vessel atherosclerosis. Right carotid system: No brachiocephalic artery or right CCA origin stenosis despite plaque. Soft and calcified plaque in the proximal right CCA with less than 50 % stenosis with respect to the distal vessel. Soft and calcified plaque leading up to the right carotid bifurcation but no significant stenosis. Tortuous cervical right ICA with a kinked appearance at the C1-C2 level. Visible right ICA siphon is patent with calcified plaque but no significant stenosis. Left carotid system: Left CCA origin plaque without stenosis. Bulky calcified plaque in the left CCA at the thoracic inlet results in 70 % stenosis with respect to the distal vessel on series 6, image 68. Combined soft and calcified plaque leading up to the left carotid bifurcation although less than 50 % stenosis with respect to the distal vessel results. Tortuous left ICA with a kinked appearance at the C2-C3 level. Patent left ICA siphon with only minimal visible calcified plaque. Vertebral arteries: Chronic proximal subclavian artery plaque. Absent motion artifact at the arch today, which seems to have simulated the appearance  of 60-65% left subclavian artery origin stenosis previously. Bulky calcified plaque resulting in moderate right vertebral origin stenosis is stable. The right vertebral is tortuous but otherwise patent to the vertebrobasilar junction without additional stenosis. Patent right PICA origin. Patent visible basilar artery without plaque or stenosis. Severe left vertebral artery origin stenosis due to calcified plaque is stable. The left vertebral is then patent to the vertebrobasilar junction with mild tortuosity. The left AICA appears dominant. Review of the MIP images confirms the above findings IMPRESSION: 1. There is no significant carotid bifurcation stenosis despite combined soft and calcified plaque bilaterally. However, bulky calcified plaque does result in 70% stenosis of the Left CCA at the thoracic inlet. See series 6, image 68. 2. Motion artifact at the aortic arch in 2018 simulated the appearance of proximal left subclavian artery stenosis. However, there is genuine moderate to severe bilateral vertebral artery origin stenosis which appears stable. 3. Aortic Atherosclerosis (ICD10-I70.0) and Emphysema (ICD10-J43.9). Electronically Signed   By: Genevie Ann M.D.   On: 10/19/2019 15:37   CUP PACEART REMOTE DEVICE CHECK  Result Date: 10/18/2019 Carelink summary report received. Battery status OK. Normal device function. No new symptom episodes, tachy episodes, brady, or pause episodes. No new AF episodes. Monthly summary reports and ROV/PRN. SChancey    Assessment/Plan 1. Occlusion and stenosis of bilateral carotid arteries Recommend:  Given the patient's asymptomatic subcritical stenosis no further invasive testing or surgery at this time.  Continue antiplatelet therapy as prescribed Continue management of CAD, HTN and Hyperlipidemia Healthy heart diet,  encouraged exercise at least 4 times per week Follow up in 6 months with duplex ultrasound and physical exam  - VAS US CAROTID; Future  2.  Essential hypertension Continue antihypertensive medications as already ordered, these medications have been reviewed and there are no changes at this time.   3. Chronic obstructive pulmonary disease, unspecified COPD type ([place]) Continue pulmonary medications and aerosols as already ordered, these medications have been reviewed and there are no changes at this time.    4. High cholesterol Continue statin as ordered and reviewed, no changes at this time     Hortencia Pilar, MD  10/26/2019 11:12 AM

## 2019-10-28 DIAGNOSIS — L209 Atopic dermatitis, unspecified: Secondary | ICD-10-CM | POA: Insufficient documentation

## 2019-11-04 ENCOUNTER — Telehealth: Payer: Self-pay

## 2019-11-04 NOTE — Telephone Encounter (Signed)
Linq alert received- Pt had multiple tachy/ possible AF episodes on 3/6 intermittently from 1400-1900.  Not all EGMs were avail on initial report.  Pt does not have documented history of AF.  ILR implanted for cryptogenic stroke.  Current meds include Metoprolol Succinate 100mg  daily and Clopidogrel 75mg  daily.  Pt is not on Timblin.   Spoke with pt, requested manual transmission to fill in missing EGMs.  Pt does report having had a bad dizzy spell on 3/6, she was at [facility] at the time, she required assistance from her family to come and help her stand/ drive her home. Pt continued to not feel well for next 24 hours following.  Episode is also documented in PCP notes from 3/8 visit.  PCP in process of adjusting BP medication, pt has f/u appt with her on 4/1.  Pt confirms that she is taking the above noted medications.  Pt denies ay current symptoms and overall feels well.   Forwarding to MD for review/ recommendations.

## 2019-11-06 ENCOUNTER — Emergency Department: Payer: Medicare Other

## 2019-11-06 ENCOUNTER — Observation Stay
Admission: EM | Admit: 2019-11-06 | Discharge: 2019-11-07 | Disposition: A | Discharge status: Home / Self Care | Payer: Medicare Other | Attending: Internal Medicine | Admitting: Internal Medicine

## 2019-11-06 ENCOUNTER — Other Ambulatory Visit: Payer: Self-pay

## 2019-11-06 ENCOUNTER — Encounter: Payer: Self-pay | Admitting: Emergency Medicine

## 2019-11-06 DIAGNOSIS — F419 Anxiety disorder, unspecified: Secondary | ICD-10-CM | POA: Diagnosis not present

## 2019-11-06 DIAGNOSIS — R0902 Hypoxemia: Secondary | ICD-10-CM | POA: Diagnosis not present

## 2019-11-06 DIAGNOSIS — Z96653 Presence of artificial knee joint, bilateral: Secondary | ICD-10-CM | POA: Diagnosis not present

## 2019-11-06 DIAGNOSIS — J449 Chronic obstructive pulmonary disease, unspecified: Secondary | ICD-10-CM | POA: Insufficient documentation

## 2019-11-06 DIAGNOSIS — R42 Dizziness and giddiness: Secondary | ICD-10-CM

## 2019-11-06 DIAGNOSIS — J441 Chronic obstructive pulmonary disease with (acute) exacerbation: Secondary | ICD-10-CM | POA: Diagnosis present

## 2019-11-06 DIAGNOSIS — G4489 Other headache syndrome: Secondary | ICD-10-CM | POA: Diagnosis not present

## 2019-11-06 DIAGNOSIS — F17210 Nicotine dependence, cigarettes, uncomplicated: Secondary | ICD-10-CM | POA: Diagnosis not present

## 2019-11-06 DIAGNOSIS — Z79899 Other long term (current) drug therapy: Secondary | ICD-10-CM | POA: Insufficient documentation

## 2019-11-06 DIAGNOSIS — I674 Hypertensive encephalopathy: Secondary | ICD-10-CM

## 2019-11-06 DIAGNOSIS — F329 Major depressive disorder, single episode, unspecified: Secondary | ICD-10-CM | POA: Insufficient documentation

## 2019-11-06 DIAGNOSIS — Z7902 Long term (current) use of antithrombotics/antiplatelets: Secondary | ICD-10-CM | POA: Diagnosis not present

## 2019-11-06 DIAGNOSIS — I10 Essential (primary) hypertension: Principal | ICD-10-CM | POA: Diagnosis present

## 2019-11-06 DIAGNOSIS — Z9981 Dependence on supplemental oxygen: Secondary | ICD-10-CM | POA: Insufficient documentation

## 2019-11-06 DIAGNOSIS — I6523 Occlusion and stenosis of bilateral carotid arteries: Secondary | ICD-10-CM | POA: Diagnosis not present

## 2019-11-06 DIAGNOSIS — D509 Iron deficiency anemia, unspecified: Secondary | ICD-10-CM | POA: Diagnosis not present

## 2019-11-06 DIAGNOSIS — Z8673 Personal history of transient ischemic attack (TIA), and cerebral infarction without residual deficits: Secondary | ICD-10-CM | POA: Diagnosis not present

## 2019-11-06 DIAGNOSIS — I672 Cerebral atherosclerosis: Secondary | ICD-10-CM | POA: Diagnosis not present

## 2019-11-06 DIAGNOSIS — E782 Mixed hyperlipidemia: Secondary | ICD-10-CM | POA: Diagnosis not present

## 2019-11-06 DIAGNOSIS — I609 Nontraumatic subarachnoid hemorrhage, unspecified: Secondary | ICD-10-CM

## 2019-11-06 DIAGNOSIS — Z20822 Contact with and (suspected) exposure to covid-19: Secondary | ICD-10-CM | POA: Diagnosis not present

## 2019-11-06 DIAGNOSIS — Z720 Tobacco use: Secondary | ICD-10-CM | POA: Diagnosis present

## 2019-11-06 DIAGNOSIS — K219 Gastro-esophageal reflux disease without esophagitis: Secondary | ICD-10-CM | POA: Diagnosis not present

## 2019-11-06 DIAGNOSIS — R531 Weakness: Secondary | ICD-10-CM | POA: Diagnosis not present

## 2019-11-06 DIAGNOSIS — I639 Cerebral infarction, unspecified: Secondary | ICD-10-CM | POA: Diagnosis present

## 2019-11-06 DIAGNOSIS — Z888 Allergy status to other drugs, medicaments and biological substances status: Secondary | ICD-10-CM | POA: Insufficient documentation

## 2019-11-06 DIAGNOSIS — E039 Hypothyroidism, unspecified: Secondary | ICD-10-CM | POA: Diagnosis present

## 2019-11-06 DIAGNOSIS — Z79890 Hormone replacement therapy (postmenopausal): Secondary | ICD-10-CM | POA: Diagnosis not present

## 2019-11-06 DIAGNOSIS — R29810 Facial weakness: Secondary | ICD-10-CM | POA: Diagnosis not present

## 2019-11-06 DIAGNOSIS — R519 Headache, unspecified: Secondary | ICD-10-CM | POA: Diagnosis not present

## 2019-11-06 HISTORY — DX: Prediabetes: R73.03

## 2019-11-06 LAB — COMPREHENSIVE METABOLIC PANEL
ALT: 14 U/L (ref 0–44)
AST: 21 U/L (ref 15–41)
Albumin: 4.2 g/dL (ref 3.5–5.0)
Alkaline Phosphatase: 94 U/L (ref 38–126)
Anion gap: 11 (ref 5–15)
BUN: 14 mg/dL (ref 8–23)
CO2: 28 mmol/L (ref 22–32)
Calcium: 9.4 mg/dL (ref 8.9–10.3)
Chloride: 103 mmol/L (ref 98–111)
Creatinine, Ser: 0.73 mg/dL (ref 0.44–1.00)
GFR calc Af Amer: >60 mL/min (ref >60)
GFR calc non Af Amer: >60 mL/min (ref >60)
Glucose, Bld: 100 mg/dL — ABNORMAL HIGH (ref 70–99)
Potassium: 3.5 mmol/L (ref 3.5–5.1)
Sodium: 142 mmol/L (ref 135–145)
Total Bilirubin: 0.6 mg/dL (ref 0.3–1.2)
Total Protein: 7.2 g/dL (ref 6.5–8.1)

## 2019-11-06 LAB — DIFFERENTIAL
Abs Immature Granulocytes: 0.04 10*3/uL (ref 0.00–0.07)
Basophils Absolute: 0.1 10*3/uL (ref 0.0–0.1)
Basophils Relative: 1 %
Eosinophils Absolute: 0.3 10*3/uL (ref 0.0–0.5)
Eosinophils Relative: 3 %
Immature Granulocytes: 1 %
Lymphocytes Relative: 16 %
Lymphs Abs: 1.4 10*3/uL (ref 0.7–4.0)
Monocytes Absolute: 0.6 10*3/uL (ref 0.1–1.0)
Monocytes Relative: 7 %
Neutro Abs: 6.2 10*3/uL (ref 1.7–7.7)
Neutrophils Relative %: 72 %

## 2019-11-06 LAB — CBC
HCT: 38.8 % (ref 36.0–46.0)
Hemoglobin: 12.5 g/dL (ref 12.0–15.0)
MCH: 28.5 pg (ref 26.0–34.0)
MCHC: 32.2 g/dL (ref 30.0–36.0)
MCV: 88.6 fL (ref 80.0–100.0)
Platelets: 263 10*3/uL (ref 150–400)
RBC: 4.38 MIL/uL (ref 3.87–5.11)
RDW: 13.2 % (ref 11.5–15.5)
WBC: 8.6 10*3/uL (ref 4.0–10.5)
nRBC: 0 % (ref 0.0–0.2)

## 2019-11-06 LAB — PROTIME-INR
INR: 1 (ref 0.8–1.2)
Prothrombin Time: 12.9 seconds (ref 11.4–15.2)

## 2019-11-06 LAB — APTT: aPTT: 37 seconds — ABNORMAL HIGH (ref 24–36)

## 2019-11-06 LAB — GLUCOSE, CAPILLARY: Glucose-Capillary: 87 mg/dL (ref 70–99)

## 2019-11-06 MED ORDER — LOSARTAN POTASSIUM 25 MG PO TABS
25.0000 mg | ORAL_TABLET | Freq: Every day | ORAL | Status: DC
  Administered 2019-11-07: 11:00:00 25 mg via ORAL
  Filled 2019-11-06: qty 1

## 2019-11-06 MED ORDER — LEVOTHYROXINE SODIUM 50 MCG PO TABS
150.0000 ug | ORAL_TABLET | Freq: Every day | ORAL | Status: DC
  Administered 2019-11-07: 06:00:00 150 ug via ORAL
  Filled 2019-11-06: qty 1

## 2019-11-06 MED ORDER — SODIUM CHLORIDE 0.9% FLUSH
3.0000 mL | Freq: Once | INTRAVENOUS | Status: AC
  Administered 2019-11-06: 3 mL via INTRAVENOUS

## 2019-11-06 MED ORDER — DONEPEZIL HCL 5 MG PO TABS: 5.0000 mg | ORAL_TABLET | Freq: Every day | ORAL | 1 refills | Status: DC

## 2019-11-06 MED ORDER — DONEPEZIL HCL 5 MG PO TABS
5.0000 mg | ORAL_TABLET | Freq: Every day | ORAL | Status: DC
  Administered 2019-11-07: 5 mg via ORAL
  Filled 2019-11-06: qty 1

## 2019-11-06 MED ORDER — ASPIRIN EC 81 MG PO TBEC
81.0000 mg | DELAYED_RELEASE_TABLET | Freq: Once | ORAL | Status: AC
  Administered 2019-11-07: 81 mg via ORAL
  Filled 2019-11-06: qty 1

## 2019-11-06 MED ORDER — STROKE: EARLY STAGES OF RECOVERY BOOK: Freq: Once | Status: AC

## 2019-11-06 MED ORDER — ACETAMINOPHEN 650 MG RE SUPP: 650.0000 mg | RECTAL | Status: DC | PRN

## 2019-11-06 MED ORDER — ACETAMINOPHEN 160 MG/5ML PO SOLN
650.0000 mg | ORAL | Status: DC | PRN
  Filled 2019-11-06: qty 20.3

## 2019-11-06 MED ORDER — ACETAMINOPHEN 325 MG PO TABS
650.0000 mg | ORAL_TABLET | ORAL | Status: DC | PRN
  Administered 2019-11-07: 650 mg via ORAL
  Filled 2019-11-06: qty 2

## 2019-11-06 MED ORDER — VENLAFAXINE HCL ER 75 MG PO CP24
150.0000 mg | ORAL_CAPSULE | Freq: Every day | ORAL | Status: DC
  Administered 2019-11-07: 150 mg via ORAL
  Filled 2019-11-06: qty 1
  Filled 2019-11-06: qty 2

## 2019-11-06 MED ORDER — LABETALOL HCL 5 MG/ML IV SOLN
10.0000 mg | Freq: Once | INTRAVENOUS | Status: AC
  Administered 2019-11-06: 10 mg via INTRAVENOUS

## 2019-11-06 MED ORDER — LOSARTAN POTASSIUM 50 MG PO TABS
25.0000 mg | ORAL_TABLET | Freq: Once | ORAL | Status: AC
  Administered 2019-11-06: 25 mg via ORAL
  Filled 2019-11-06: qty 1

## 2019-11-06 MED ORDER — IOHEXOL 350 MG/ML SOLN
75.0000 mL | Freq: Once | INTRAVENOUS | Status: AC | PRN
  Administered 2019-11-06: 75 mL via INTRAVENOUS

## 2019-11-06 MED ORDER — ASPIRIN EC 325 MG PO TBEC
325.0000 mg | DELAYED_RELEASE_TABLET | Freq: Once | ORAL | Status: AC
  Administered 2019-11-06: 325 mg via ORAL
  Filled 2019-11-06: qty 1

## 2019-11-06 MED ORDER — ATORVASTATIN CALCIUM 20 MG PO TABS
80.0000 mg | ORAL_TABLET | Freq: Every evening | ORAL | Status: DC
  Administered 2019-11-06: 80 mg via ORAL
  Filled 2019-11-06: qty 4

## 2019-11-06 MED ORDER — LABETALOL HCL 5 MG/ML IV SOLN
5.0000 mg | Freq: Once | INTRAVENOUS | Status: DC
  Administered 2019-11-06: 5 mg via INTRAVENOUS
  Filled 2019-11-06: qty 4

## 2019-11-06 MED ORDER — CLOPIDOGREL BISULFATE 75 MG PO TABS
75.0000 mg | ORAL_TABLET | Freq: Every day | ORAL | Status: DC
  Administered 2019-11-07: 11:00:00 75 mg via ORAL
  Filled 2019-11-06: qty 1

## 2019-11-06 NOTE — Progress Notes (Signed)
   11/06/19 1221  Clinical Encounter Type  Visited With Patient;Health care provider  Visit Type Initial  Referral From Nurse  Consult/Referral To Chaplain  Chaplain briefly visited with patient as a response to page, Code Stroke.When Chaplain entered the room the doctor was working with patient. After doctor stopped working with patient, Bonney Roussel went to the bedside and introduced herself and asked how patient was doing? Patient said fine. Chaplain told patient of Chaplains availability and left.

## 2019-11-06 NOTE — ED Notes (Signed)
Pt transported otf for CT 

## 2019-11-06 NOTE — ED Notes (Addendum)
Patient's daughter states patient has had two Covid vaccines. Attempted to call report, but floor needs Covid test first. Provider aware.

## 2019-11-06 NOTE — ED Notes (Signed)
Purewick applied for patient's comfort and safety.

## 2019-11-06 NOTE — ED Notes (Signed)
Pt ambulated to toilet with steady gait

## 2019-11-06 NOTE — ED Notes (Signed)
Ok per Dr. Posey Pronto to place order for carb consistent diet

## 2019-11-06 NOTE — ED Notes (Signed)
Pt give iced water and warm blanket, daughter remains at bedside

## 2019-11-06 NOTE — ED Provider Notes (Signed)
[facility] Emergency Department Provider Note  ____________________________________________   First MD Initiated Contact with Patient 11/06/19 1209     (approximate)  I have reviewed the triage vital signs and the nursing notes.   HISTORY  Chief Complaint Headache and Dizziness    HPI [name] Douglas is a 79 y.o. female  With h/o HTN, HLD, COPD, here with confusion, tingling in hands. Pt reports she was on her way to her beautician this morning when she began to feel acute onset of mild headache, generalized, along with tingling and weakness in her L>R arms and numbness feeling. She felt very tired and like she was going to pass out. She went to her beauticians and called 911. She reports she felt like her tongue was "heavy" during the episode and she has since had a mild HA. No difficulty swallowing. Of note, she reports a h/o similar episode last week as well. She has also been noticing her BP is increasing and has been up to 200-220s. She just started losartan recently w/ her PCP.     Past Medical History:  Diagnosis Date  . Anxiety   . Arthritis    "shoulders" (08/20/2014)  . Asthma   . Chronic lower back pain   . Complication of anesthesia    "agitated & restless after knee replacement in 2008"  . COPD (chronic obstructive pulmonary disease) ([place])   . DDD (degenerative disc disease), lumbar   . Depression   . Diverticulosis   . GERD (gastroesophageal reflux disease)   . High cholesterol   . Hypertension   . Hypothyroidism   . On home oxygen therapy    "2L at night" (08/20/2014)  . Shortness of breath dyspnea    with exertion  . Stroke [facility])     Patient Active Problem List   Diagnosis Date Noted  . Atopic dermatitis 10/28/2019  . History of CVA (cerebrovascular accident) 10/07/2019  . Intractable episodic cluster headache 09/20/2019  . Other symptoms and signs involving the nervous system 09/20/2019  . Iron deficiency anemia 05/17/2019    . Vitamin D deficiency 05/17/2019  . Flu vaccine need 05/17/2019  . Encounter for general adult medical examination with abnormal findings 04/05/2019  . Nausea and vomiting 04/05/2019  . Dysuria 04/05/2019  . Need for vaccination against Streptococcus pneumoniae using pneumococcal conjugate vaccine 13 04/05/2019  . Acute CVA (cerebrovascular accident) ([place]) 12/20/2017  . Occlusion and stenosis of bilateral carotid arteries 11/24/2017  . Subarachnoid hemorrhage ([place]) 12/09/2016  . L brain TIA (transient ischemic attack) 08/24/2016  . Solitary pulmonary nodule 08/24/2016  . Aphasia   . Cellulitis of right knee 07/17/2016  . Primary localized osteoarthritis of right knee 07/03/2016  . Ileus ([place]) 04/24/2016  . Generalized abdominal pain   . Colitis due to Clostridium difficile   . Recurrent Clostridium difficile diarrhea   . Enteritis due to Clostridium difficile 08/22/2014  . Diarrhea 08/20/2014  . Essential hypertension 08/20/2014  . High cholesterol 08/20/2014  . Hypothyroidism 08/20/2014  . Tobacco abuse 08/20/2014  . COPD (chronic obstructive pulmonary disease) ([place]) 08/20/2014  . Back pain 08/20/2014  . Acute diarrhea 07/27/2014    Past Surgical History:  Procedure Laterality Date  . CARDIAC CATHETERIZATION  ~ 2007  . EXCISIONAL HEMORRHOIDECTOMY  1960's  . EYE SURGERY Bilateral    Cataract extraction with IOL  . JOINT REPLACEMENT Left 2009  . KNEE ARTHROSCOPY Left X 2 <2008  . KNEE ARTHROSCOPY WITH SUBCHONDROPLASTY Right 04/19/2016  Procedure: KNEE ARTHROSCOPY WITH SUBCHONDROPLASTY, PARTIAL MENISCECTOMY;  Surgeon: Hessie Knows, MD;  Location: [facility] ORS;  Service: Orthopedics;  Laterality: Right;  . LOOP RECORDER INSERTION N/A 01/02/2018   Procedure: LOOP RECORDER INSERTION;  Surgeon: Deboraha Sprang, MD;  Location: [place] [facility];  Service: Cardiovascular;  Laterality: N/A;  . SHOULDER OPEN ROTATOR CUFF REPAIR Right 2001  . TONSILLECTOMY  ~ 1950  . TOTAL KNEE  ARTHROPLASTY Left 2008  . TOTAL KNEE ARTHROPLASTY Right 07/03/2016   Procedure: TOTAL KNEE ARTHROPLASTY;  Surgeon: Hessie Knows, MD;  Location: [facility] ORS;  Service: Orthopedics;  Laterality: Right;  . TUBAL LIGATION  1973  . VAGINAL HYSTERECTOMY  1974    Prior to Admission medications   Medication Sig Start Date End Date Taking? Authorizing Provider  acetaminophen (TYLENOL) 500 MG tablet Take 500-1,000 mg by mouth every 6 (six) hours as needed for mild pain, moderate pain or fever.    Yes [provider]  atorvastatin (LIPITOR) 40 MG tablet TAKE 2 TABLETS (80 MG TOTAL) BY MOUTH DAILY AT 6PM Patient taking differently: Take 80 mg by mouth every evening.  04/06/19  Yes Boscia, Greer Ee, NP  calcium carbonate (OS-CAL) 1250 (500 Ca) MG chewable tablet Chew 1 tablet by mouth daily.    Yes [provider]  clopidogrel (PLAVIX) 75 MG tablet Take 1 tablet (75 mg total) by mouth daily. 06/25/19  Yes Boscia, Greer Ee, NP  diphenhydrAMINE HCl, Sleep, (SLEEP AID) 25 MG CAPS Take 25 mg by mouth at bedtime as needed (sleep or allergies).    Yes [provider]  docusate sodium (COLACE) 100 MG capsule Take 100 mg by mouth at bedtime.   Yes [provider]  donepezil (ARICEPT) 5 MG tablet Take 1 tablet (5 mg total) by mouth at bedtime. 11/06/19  Yes Boscia, Greer Ee, NP  ergocalciferol (DRISDOL) 1.25 MG (50000 UT) capsule Take 1 capsule (50,000 Units total) by mouth once a week. 05/15/19  Yes Boscia, Greer Ee, NP  levothyroxine (SYNTHROID) 100 MCG tablet TAKE 1.5 TABLETS (150 MCG TOTAL) BY MOUTH DAILY BEFORE BREAKFAST Patient taking differently: Take 150 mcg by mouth daily before breakfast.  03/31/19  Yes Boscia, Heather E, NP  losartan (COZAAR) 25 MG tablet Take 1 tablet (25 mg total) by mouth daily. 10/26/19  Yes Boscia, Greer Ee, NP  metoprolol succinate (TOPROL-XL) 50 MG 24 hr tablet Take 2 tablets (100 mg total) by mouth daily. 09/03/19  Yes Boscia, Heather E, NP    venlafaxine XR (EFFEXOR-XR) 75 MG 24 hr capsule TAKE 2 CAPSULES BY MOUTH DAILY. Patient taking differently: Take 150 mg by mouth daily.  04/06/19  Yes Boscia, Greer Ee, NP  Crisaborole (EUCRISA) 2 % OINT Apply 1 application topically 2 (two) times daily. Patient not taking: Reported on 11/06/2019 10/26/19   Ronnell Freshwater, NP    Allergies Heparin  Family History  Problem Relation Age of Onset  . Diabetes Mother   . Hypertension Mother   . Lung cancer Mother   . Congestive Heart Failure Father   . Congestive Heart Failure Sister     Social History Social History   Tobacco Use  . Smoking status: Current Some Day Smoker    Years: 53.00    Types: Cigarettes  . Smokeless tobacco: Never Used  . Tobacco comment: 3 cigarettes in week  Substance Use Topics  . Alcohol use: No  . Drug use: No    Review of Systems  Review of Systems  Constitutional: Positive  for fatigue. Negative for fever.  HENT: Negative for congestion and sore throat.   Eyes: Negative for visual disturbance.  Respiratory: Negative for cough and shortness of breath.   Cardiovascular: Negative for chest pain.  Gastrointestinal: Negative for abdominal pain, diarrhea, nausea and vomiting.  Genitourinary: Negative for flank pain.  Musculoskeletal: Negative for back pain and neck pain.  Skin: Negative for rash and wound.  Neurological: Positive for weakness and headaches.  Psychiatric/Behavioral: Positive for confusion.  All other systems reviewed and are negative.    ____________________________________________  PHYSICAL EXAM:      VITAL SIGNS: ED Triage Vitals  Enc Vitals Group     BP 11/06/19 1210 (!) 243/94     Pulse --      Resp 11/06/19 1210 (!) 25     Temp 11/06/19 1219 98.1 F (36.7 C)     Temp Source 11/06/19 1219 Oral     SpO2 --      Weight --      Height --      Head Circumference --      Peak Flow --      Pain Score 11/06/19 1214 3     Pain Loc --      Pain Edu? --      Excl. in  [place]? --      Physical Exam Vitals and nursing note reviewed.  Constitutional:      General: She is not in acute distress.    Appearance: She is well-developed.  HENT:     Head: Normocephalic and atraumatic.  Eyes:     Conjunctiva/sclera: Conjunctivae normal.  Cardiovascular:     Rate and Rhythm: Normal rate and regular rhythm.     Heart sounds: Normal heart sounds. No murmur. No friction rub.  Pulmonary:     Effort: Pulmonary effort is normal. No respiratory distress.     Breath sounds: Normal breath sounds. No wheezing or rales.  Abdominal:     General: There is no distension.     Palpations: Abdomen is soft.     Tenderness: There is no abdominal tenderness.  Musculoskeletal:     Cervical back: Neck supple.  Skin:    General: Skin is warm.     Capillary Refill: Capillary refill takes less than 2 seconds.  Neurological:     Mental Status: She is alert and oriented to person, place, and time.     GCS: GCS eye subscore is 4. GCS verbal subscore is 5. GCS motor subscore is 6.     Motor: No abnormal muscle tone.     Comments: Left dysmetria noted.  Cranial nerves are otherwise intact.  Tongue protrusion is midline.  Strength is 5 out of 5 bilateral upper extremities, though very slight possible drift noted on the left.  Normal sensation light touch.       ____________________________________________   LABS (all labs ordered are listed, but only abnormal results are displayed)  Labs Reviewed  APTT - Abnormal; Notable for the following components:      Result Value   aPTT 37 (*)    All other components within normal limits  COMPREHENSIVE METABOLIC PANEL - Abnormal; Notable for the following components:   Glucose, Bld 100 (*)    All other components within normal limits  PROTIME-INR  CBC  DIFFERENTIAL  GLUCOSE, CAPILLARY  CBG MONITORING, ED    ____________________________________________  EKG: Normal sinus rhythm, ventricular rate 97.  QRS 108, QTc 501.  Slight  prolonged QT interval.  No acute ST elevations or depressions ________________________________________  RADIOLOGY All imaging, including plain films, CT scans, and ultrasounds, independently reviewed by me, and interpretations confirmed via formal radiology reads.  ED MD interpretation:   CT head: Remote right parietal lobe infarct, moderate diffuse white matter disease CT angio head neck: Diffuse vascular disease, up to 50% in bilateral carotids  Official radiology report(s): CT ANGIO HEAD W OR WO CONTRAST  Result Date: 11/06/2019 CLINICAL DATA:  Acute onset of bilateral upper and lower extremities paresthesia. EXAM: CT ANGIOGRAPHY HEAD AND NECK TECHNIQUE: Multidetector CT imaging of the head and neck was performed using the standard protocol during bolus administration of intravenous contrast. Multiplanar CT image reconstructions and MIPs were obtained to evaluate the vascular anatomy. Carotid stenosis measurements (when applicable) are obtained utilizing NASCET criteria, using the distal internal carotid diameter as the denominator. CONTRAST:  38mL OMNIPAQUE IOHEXOL 350 MG/ML SOLN COMPARISON:  Head CT earlier same day. Previous CT angiography 08/22/2016 FINDINGS: CTA NECK FINDINGS Aortic arch: Aortic atherosclerosis. No aneurysm or dissection. Branching pattern is normal. Right carotid system: Common carotid artery shows atherosclerotic plaque. At the level of the distal common carotid artery, there is focal plaque with stenosis of 50%. At the bifurcation, there is soft and calcified plaque but no stenosis of the proximal ICA compared to the more distal cervical ICA diameter. Left carotid system: Common carotid artery shows atherosclerotic plaque with stenosis estimated at 30-50% in the midportion. At the bifurcation, there is complex soft and calcified plaque. Minimal diameter of the proximal ICA is 3 mm. Compared to a more distal cervical ICA diameter of 5 mm, this indicates a 40% stenosis.  Vertebral arteries: There is atherosclerotic plaque at both vertebral artery origins. Detail is somewhat limited by chest density and breathing motion, but stenosis is estimated at 50-70% on both sides. Both vessels do show flow beyond that, without stenosis in the cervical region. Skeleton: Degenerative cervical spondylosis. Other neck: No soft tissue mass or lymphadenopathy. Upper chest: No active upper lung disease.  Some scarring. Review of the MIP images confirms the above findings CTA HEAD FINDINGS Anterior circulation: Both internal carotid arteries are patent through the skull base and siphon regions. There is siphon atherosclerotic calcification but no stenosis greater than 30%. The anterior and middle cerebral vessels show flow. A1 segment on the right is diminutive, with both anterior cerebral arteries receiving most of there supply from the left carotid circulation. No sign of large or medium vessel occlusion. Focal atherosclerotic plaque affecting the distal M1 segment on the right with stenosis of 30-50%. Posterior circulation: Both vertebral arteries widely patent to the basilar. No basilar stenosis. Posterior circulation branch vessels are patent. No occlusion or flow limiting stenosis. Venous sinuses: Patent and normal. Anatomic variants: None significant. Review of the MIP images confirms the above findings IMPRESSION: No acute large or medium vessel occlusion. Atherosclerotic disease of both common carotid arteries with stenoses of up to 50% on both sides. Soft and calcified plaque at both carotid bifurcations. No measurable stenosis on the right. 40% stenosis of the proximal ICA on the left. 50-70% stenosis both vertebral artery origins. The vessels are widely patent beyond that however. Focal calcified plaque of the distal right M1 segment with stenosis estimated at 30-50%. Call report in progress. Electronically Signed   By: Nelson Chimes M.D.   On: 11/06/2019 13:42   CT ANGIO NECK W OR WO  CONTRAST  Result Date: 11/06/2019 CLINICAL DATA:  Acute onset of bilateral upper and lower  extremities paresthesia. EXAM: CT ANGIOGRAPHY HEAD AND NECK TECHNIQUE: Multidetector CT imaging of the head and neck was performed using the standard protocol during bolus administration of intravenous contrast. Multiplanar CT image reconstructions and MIPs were obtained to evaluate the vascular anatomy. Carotid stenosis measurements (when applicable) are obtained utilizing NASCET criteria, using the distal internal carotid diameter as the denominator. CONTRAST:  5mL OMNIPAQUE IOHEXOL 350 MG/ML SOLN COMPARISON:  Head CT earlier same day. Previous CT angiography 08/22/2016 FINDINGS: CTA NECK FINDINGS Aortic arch: Aortic atherosclerosis. No aneurysm or dissection. Branching pattern is normal. Right carotid system: Common carotid artery shows atherosclerotic plaque. At the level of the distal common carotid artery, there is focal plaque with stenosis of 50%. At the bifurcation, there is soft and calcified plaque but no stenosis of the proximal ICA compared to the more distal cervical ICA diameter. Left carotid system: Common carotid artery shows atherosclerotic plaque with stenosis estimated at 30-50% in the midportion. At the bifurcation, there is complex soft and calcified plaque. Minimal diameter of the proximal ICA is 3 mm. Compared to a more distal cervical ICA diameter of 5 mm, this indicates a 40% stenosis. Vertebral arteries: There is atherosclerotic plaque at both vertebral artery origins. Detail is somewhat limited by chest density and breathing motion, but stenosis is estimated at 50-70% on both sides. Both vessels do show flow beyond that, without stenosis in the cervical region. Skeleton: Degenerative cervical spondylosis. Other neck: No soft tissue mass or lymphadenopathy. Upper chest: No active upper lung disease.  Some scarring. Review of the MIP images confirms the above findings CTA HEAD FINDINGS Anterior  circulation: Both internal carotid arteries are patent through the skull base and siphon regions. There is siphon atherosclerotic calcification but no stenosis greater than 30%. The anterior and middle cerebral vessels show flow. A1 segment on the right is diminutive, with both anterior cerebral arteries receiving most of there supply from the left carotid circulation. No sign of large or medium vessel occlusion. Focal atherosclerotic plaque affecting the distal M1 segment on the right with stenosis of 30-50%. Posterior circulation: Both vertebral arteries widely patent to the basilar. No basilar stenosis. Posterior circulation branch vessels are patent. No occlusion or flow limiting stenosis. Venous sinuses: Patent and normal. Anatomic variants: None significant. Review of the MIP images confirms the above findings IMPRESSION: No acute large or medium vessel occlusion. Atherosclerotic disease of both common carotid arteries with stenoses of up to 50% on both sides. Soft and calcified plaque at both carotid bifurcations. No measurable stenosis on the right. 40% stenosis of the proximal ICA on the left. 50-70% stenosis both vertebral artery origins. The vessels are widely patent beyond that however. Focal calcified plaque of the distal right M1 segment with stenosis estimated at 30-50%. Call report in progress. Electronically Signed   By: Nelson Chimes M.D.   On: 11/06/2019 13:42   CT HEAD CODE STROKE WO CONTRAST  Result Date: 11/06/2019 CLINICAL DATA:  Code stroke. Acute onset of bilateral upper and lower extremity paresthesias, headache, and nausea. Personal history of CVA. Symptoms started 2 hours ago. Hypertension. Right-sided grip weakness. EXAM: CT HEAD WITHOUT CONTRAST TECHNIQUE: Contiguous axial images were obtained from the base of the skull through the vertex without intravenous contrast. COMPARISON:  CT head without contrast 09/29/2019. FINDINGS: Brain: Remote right parietal lobe infarct is again noted.  Ex vacuo dilation of the right lateral ventricle is present. Moderate diffuse white matter disease is stable. The basal ganglia are intact. Acute or focal cortical  infarct is present otherwise. Insular ribbon is normal bilaterally. No acute hemorrhage or mass lesion is present. The ventricles are proportionate to the degree of atrophy. No significant extraaxial fluid collection is present. The brainstem and cerebellum are within normal limits. Vascular: Atherosclerotic calcification is present at the right MCA bifurcation, stable. Additional calcifications are present within the cavernous internal carotid arteries bilaterally. No hyperdense vessel is present. Skull: Calvarium is intact. No focal lytic or blastic lesions are present. No significant extracranial soft tissue lesion is present. Sinuses/Orbits: The paranasal sinuses and mastoid air cells are clear. Bilateral lens replacements are noted. Globes and orbits are otherwise unremarkable. ASPECTS [facility] Stroke Program Early CT Score) - Ganglionic level infarction (caudate, lentiform nuclei, internal capsule, insula, M1-M3 cortex): 7/7 - Supraganglionic infarction (M4-M6 cortex): 3/3 Total score (0-10 with 10 being normal): 10/10 IMPRESSION: 1. Remote right parietal lobe infarct. 2. No acute abnormality. 3. Moderate diffuse white matter disease likely reflects sequela of chronic microvascular ischemia. 4. ASPECTS is 10/10 These results were called by telephone at the time of interpretation on 11/06/2019 at 12:43 pm to provider Duffy Bruce , who verbally acknowledged these results. Electronically Signed   By: San Morelle M.D.   On: 11/06/2019 12:46    ____________________________________________  PROCEDURES   Procedure(s) performed (including Critical Care):  Procedures  ____________________________________________  INITIAL IMPRESSION / MDM / [place] / ED COURSE  As part of my medical decision making, I reviewed the  following data within the [place] notes reviewed and incorporated, Old chart reviewed, Notes from prior ED visits, and [place] Controlled Substance Database       *ALYANNA KEIMIG was evaluated in Emergency Department on 11/06/2019 for the symptoms described in the history of present illness. She was evaluated in the context of the global COVID-19 pandemic, which necessitated consideration that the patient might be at risk for infection with the SARS-CoV-2 virus that causes COVID-19. Institutional protocols and algorithms that pertain to the evaluation of patients at risk for COVID-19 are in a state of rapid change based on information released by regulatory bodies including the CDC and federal and state organizations. These policies and algorithms were followed during the patient's care in the ED.  Some ED evaluations and interventions may be delayed as a result of limited staffing during the pandemic.*     Medical Decision Making: 79 year old female here with transient altered mental status, headache, and severe hypertension.  She had some mild left pronator drift and reported dysarthria on arrival, so she was initially activated as a code stroke.  This resolved, and she has largely nonspecific symptoms with mild confusion.  CT scans show diffuse atherosclerotic disease without large vessel occlusion.  Per Dr. Doy Mince, suspect some of this is related to hypertensive encephalopathy.  Do not feel MRI needed for ischemic work-up.  Patient does report intermittent confusion and increasing hypertension at home.  She was given labetalol and losartan with persistent hypertension and intermittent headache.  Given her age, confusion, and severe hypertension, will admit for observation.  ____________________________________________  FINAL CLINICAL IMPRESSION(S) / ED DIAGNOSES  Final diagnoses:  Essential hypertension  Hypertensive encephalopathy     MEDICATIONS GIVEN DURING THIS  VISIT:  Medications  sodium chloride flush (NS) 0.9 % injection 3 mL (has no administration in time range)  iohexol (OMNIPAQUE) 350 MG/ML injection 75 mL (75 mLs Intravenous Contrast Given 11/06/19 1306)  aspirin EC tablet 325 mg (325 mg Oral Given 11/06/19 1443)  losartan (COZAAR)  tablet 25 mg (25 mg Oral Given 11/06/19 1443)  labetalol (NORMODYNE) injection 10 mg (10 mg Intravenous Given 11/06/19 1448)     ED Discharge Orders    None       Note:  This document was prepared using Dragon voice recognition software and may include unintentional dictation errors.   Duffy Bruce, MD 11/06/19 [phone]

## 2019-11-06 NOTE — ED Triage Notes (Signed)
Pt to ED via [facility] from [place] for Dizziness, bilateral arm and leg tingling, headache, and nausea. Pt states that her symptoms started around 1040. Pt has hx/o CVA. Pt states that her blood pressure medication was changed 1 week ago. Per EMS last BP 225/100. Pt has slightly weaker grip on right. No facial droop noted, no slurred speech. Pt is in NAD at this time. Dr. Ellender Hose called to bedside.

## 2019-11-06 NOTE — ED Notes (Signed)
Report given to Jenna, RN

## 2019-11-06 NOTE — H&P (Signed)
.  History and Physical    [name] Douglas [id number] DOB: [birth year] DOA: 11/06/2019   PCP: Lavera Guise, MD   Patient coming from: Home  Chief Complaint: Dizziness  HPI: [name] Douglas is a 79 y.o. female with medical history significant of history of stroke, carotid stenosis, HTN, HLD who reports she was driving today and noted acute onset of dizziness and then started to have paresthesias throughout her body. Pt also reports ongoing tobacco abuse although she is cutting back to few cigarettes a day. Pt was driving prior to her symptoms onset. No visual symptoms . Pt seen in ed for eval of dizziness and nausea. Pt was on her way to get her hair fixed, driving . Pt got a tingling and  Numbness or her neck and throat and torso and ue/ and le along with nausea,pt had dizziness and nausea and no chest pain no sob.pt had a headache that stated inside the salon.pt went inside and called EMS. Pt does not take any aspirin 81 mg , she was on plavix since her first stroke. Pt states she was told to take plavix not aspirin. In 2015 pt had BRBPR  Pt saw dr. Tiffany Kocher and was told she had c.diff colitis.  ED Course: Blood pressure (!) 164/54, pulse 85, temperature 98.1 F (36.7 C), temperature source Oral, resp. rate (!) 23, SpO2 95 %. Pt in ED was seen by neurologist and code stroke was called in ed. Pt had hypertensive urgency , stat imaging studies showed negative noncontrast head ct, and cta of head showed  No large vessel occlusion but common catoyid and vertebral artery   Review of Systems: As per HPI otherwise 10 point review of systems negative.    Past Medical History:  Diagnosis Date  . Anxiety   . Arthritis    "shoulders" (08/20/2014)  . Asthma   . Chronic lower back pain   . Complication of anesthesia    "agitated & restless after knee replacement in 2008"  . COPD (chronic obstructive pulmonary disease) ([place])   . DDD (degenerative disc disease), lumbar   . Depression   .  Diverticulosis   . GERD (gastroesophageal reflux disease)   . High cholesterol   . Hypertension   . Hypothyroidism   . On home oxygen therapy    "2L at night" (08/20/2014)  . Pre-diabetes   . Shortness of breath dyspnea    with exertion  . Stroke [facility])     Past Surgical History:  Procedure Laterality Date  . CARDIAC CATHETERIZATION  ~ 2007  . EXCISIONAL HEMORRHOIDECTOMY  1960's  . EYE SURGERY Bilateral    Cataract extraction with IOL  . JOINT REPLACEMENT Left 2009  . KNEE ARTHROSCOPY Left X 2 <2008  . KNEE ARTHROSCOPY WITH SUBCHONDROPLASTY Right 04/19/2016   Procedure: KNEE ARTHROSCOPY WITH SUBCHONDROPLASTY, PARTIAL MENISCECTOMY;  Surgeon: Hessie Knows, MD;  Location: [facility] ORS;  Service: Orthopedics;  Laterality: Right;  . LOOP RECORDER INSERTION N/A 01/02/2018   Procedure: LOOP RECORDER INSERTION;  Surgeon: Deboraha Sprang, MD;  Location: [place] [facility];  Service: Cardiovascular;  Laterality: N/A;  . SHOULDER OPEN ROTATOR CUFF REPAIR Right 2001  . TONSILLECTOMY  ~ 1950  . TOTAL KNEE ARTHROPLASTY Left 2008  . TOTAL KNEE ARTHROPLASTY Right 07/03/2016   Procedure: TOTAL KNEE ARTHROPLASTY;  Surgeon: Hessie Knows, MD;  Location: [facility] ORS;  Service: Orthopedics;  Laterality: Right;  . TUBAL LIGATION  1973  . VAGINAL HYSTERECTOMY  1974  reports that she has been smoking cigarettes. She has a 13.25 pack-year smoking history. She has never used smokeless tobacco. She reports that she does not drink alcohol or use drugs.  Allergies  Allergen Reactions  . Heparin Nausea And Vomiting    Family History  Problem Relation Age of Onset  . Diabetes Mother   . Hypertension Mother   . Lung cancer Mother   . Congestive Heart Failure Father   . Congestive Heart Failure Sister    Prior to Admission medications   Medication Sig Start Date End Date Taking? Authorizing Provider  acetaminophen (TYLENOL) 500 MG tablet Take 500-1,000 mg by mouth every 6 (six) hours as needed for mild  pain, moderate pain or fever.    Yes [provider]  atorvastatin (LIPITOR) 40 MG tablet TAKE 2 TABLETS (80 MG TOTAL) BY MOUTH DAILY AT 6PM Patient taking differently: Take 80 mg by mouth every evening.  04/06/19  Yes Boscia, Greer Ee, NP  calcium carbonate (OS-CAL) 1250 (500 Ca) MG chewable tablet Chew 1 tablet by mouth daily.    Yes [provider]  clopidogrel (PLAVIX) 75 MG tablet Take 1 tablet (75 mg total) by mouth daily. 06/25/19  Yes Boscia, Greer Ee, NP  diphenhydrAMINE HCl, Sleep, (SLEEP AID) 25 MG CAPS Take 25 mg by mouth at bedtime as needed (sleep or allergies).    Yes [provider]  docusate sodium (COLACE) 100 MG capsule Take 100 mg by mouth at bedtime.   Yes [provider]  donepezil (ARICEPT) 5 MG tablet Take 1 tablet (5 mg total) by mouth at bedtime. 11/06/19  Yes Boscia, Greer Ee, NP  ergocalciferol (DRISDOL) 1.25 MG (50000 UT) capsule Take 1 capsule (50,000 Units total) by mouth once a week. 05/15/19  Yes Boscia, Greer Ee, NP  levothyroxine (SYNTHROID) 100 MCG tablet TAKE 1.5 TABLETS (150 MCG TOTAL) BY MOUTH DAILY BEFORE BREAKFAST Patient taking differently: Take 150 mcg by mouth daily before breakfast.  03/31/19  Yes Boscia, Heather E, NP  losartan (COZAAR) 25 MG tablet Take 1 tablet (25 mg total) by mouth daily. 10/26/19  Yes Boscia, Greer Ee, NP  metoprolol succinate (TOPROL-XL) 50 MG 24 hr tablet Take 2 tablets (100 mg total) by mouth daily. 09/03/19  Yes Boscia, Heather E, NP  venlafaxine XR (EFFEXOR-XR) 75 MG 24 hr capsule TAKE 2 CAPSULES BY MOUTH DAILY. Patient taking differently: Take 150 mg by mouth daily.  04/06/19  Yes Boscia, Greer Ee, NP  Crisaborole (EUCRISA) 2 % OINT Apply 1 application topically 2 (two) times daily. Patient not taking: Reported on 11/06/2019 10/26/19   Ronnell Freshwater, NP    Physical Exam: Vitals:   11/06/19 1219 11/06/19 1318 11/06/19 1330 11/06/19 1430  BP:  (!) 197/68 (!) 216/82 (!) 198/157  Pulse:  94  91 93  Resp:      Temp: 98.1 F (36.7 C)     TempSrc: Oral     SpO2:  99% 97% 97%      Constitutional: NAD, calm, comfortable Vitals:   11/06/19 1219 11/06/19 1318 11/06/19 1330 11/06/19 1430  BP:  (!) 197/68 (!) 216/82 (!) 198/157  Pulse:  94 91 93  Resp:      Temp: 98.1 F (36.7 C)     TempSrc: Oral     SpO2:  99% 97% 97%   Eyes: PERRL,EOMI , no nystagmus, lids and conjunctivae normal ENMT: Mucous membranes are moist. Posterior pharynx clear of any exudate or lesions.Normal dentition.  Neck: normal,  supple, no masses, no thyromegaly Respiratory: clear to auscultation bilaterally, no wheezing, no crackles. Normal respiratory effort. No accessory muscle use.  Cardiovascular: Regular rate and rhythm, + murmurs / rubs / gallops. No extremity edema. 2+ pedal pulses. No carotid bruits.  Abdomen: no tenderness, no masses palpated. No hepatosplenomegaly. Bowel sounds positive.  Musculoskeletal: no clubbing / cyanosis. No joint deformity upper and lower extremities. Good ROM, no contractures. Normal muscle tone.  Skin: no rashes, lesions, ulcers. No induration Neurologic: CN 2-12 grossly intact.  DTR normal. Strength 5/5 in all 4.  Psychiatric: Normal judgment and insight. Alert and oriented x 3. Normal mood.    Labs on Admission: I have personally reviewed following labs and imaging studies  CBC: Recent Labs  Lab 11/06/19 1218  WBC 8.6  NEUTROABS 6.2  HGB 12.5  HCT 38.8  MCV 88.6  PLT 99991111   Basic Metabolic Panel: Recent Labs  Lab 11/06/19 1218  NA 142  K 3.5  CL 103  CO2 28  GLUCOSE 100*  BUN 14  CREATININE 0.73  CALCIUM 9.4   GFR: Estimated Creatinine Clearance: 64.8 mL/min (by C-G formula based on SCr of 0.73 mg/dL). Liver Function Tests: Recent Labs  Lab 11/06/19 1218  AST 21  ALT 14  ALKPHOS 94  BILITOT 0.6  PROT 7.2  ALBUMIN 4.2   No results for input(s): LIPASE, AMYLASE in the last 168 hours. No results for input(s): AMMONIA in the last 168  hours. Coagulation Profile: Recent Labs  Lab 11/06/19 1218  INR 1.0   Cardiac Enzymes: No results for input(s): CKTOTAL, CKMB, CKMBINDEX, TROPONINI in the last 168 hours. BNP (last 3 results) No results for input(s): PROBNP in the last 8760 hours. HbA1C: No results for input(s): HGBA1C in the last 72 hours. CBG: Recent Labs  Lab 11/06/19 1224  GLUCAP 87   Lipid Profile: No results for input(s): CHOL, HDL, LDLCALC, TRIG, CHOLHDL, LDLDIRECT in the last 72 hours. Thyroid Function Tests: No results for input(s): TSH, T4TOTAL, FREET4, T3FREE, THYROIDAB in the last 72 hours. Anemia Panel: No results for input(s): VITAMINB12, FOLATE, FERRITIN, TIBC, IRON, RETICCTPCT in the last 72 hours. Urine analysis:    Component Value Date/Time   COLORURINE STRAW (A) 12/20/2017 1250   APPEARANCEUR Clear 03/31/2019 1459   LABSPEC 1.004 (L) 12/20/2017 1250   LABSPEC 1.035 08/02/2014 2331   PHURINE 6.0 12/20/2017 1250   GLUCOSEU Negative 03/31/2019 1459   GLUCOSEU Negative 08/02/2014 2331   HGBUR NEGATIVE 12/20/2017 1250   BILIRUBINUR Negative 03/31/2019 1459   BILIRUBINUR Negative 08/02/2014 2331   KETONESUR NEGATIVE 12/20/2017 1250   PROTEINUR Negative 03/31/2019 1459   PROTEINUR NEGATIVE 12/20/2017 1250   UROBILINOGEN 1.0 08/20/2014 2125   NITRITE Negative 03/31/2019 1459   NITRITE NEGATIVE 12/20/2017 1250   LEUKOCYTESUR Negative 03/31/2019 1459   LEUKOCYTESUR Negative 08/02/2014 2331    Radiological Exams on Admission: CT ANGIO HEAD W OR WO CONTRAST  Result Date: 11/06/2019 CLINICAL DATA:  Acute onset of bilateral upper and lower extremities paresthesia. EXAM: CT ANGIOGRAPHY HEAD AND NECK TECHNIQUE: Multidetector CT imaging of the head and neck was performed using the standard protocol during bolus administration of intravenous contrast. Multiplanar CT image reconstructions and MIPs were obtained to evaluate the vascular anatomy. Carotid stenosis measurements (when applicable) are  obtained utilizing NASCET criteria, using the distal internal carotid diameter as the denominator. CONTRAST:  50mL OMNIPAQUE IOHEXOL 350 MG/ML SOLN COMPARISON:  Head CT earlier same day. Previous CT angiography 08/22/2016 FINDINGS: CTA NECK FINDINGS Aortic  arch: Aortic atherosclerosis. No aneurysm or dissection. Branching pattern is normal. Right carotid system: Common carotid artery shows atherosclerotic plaque. At the level of the distal common carotid artery, there is focal plaque with stenosis of 50%. At the bifurcation, there is soft and calcified plaque but no stenosis of the proximal ICA compared to the more distal cervical ICA diameter. Left carotid system: Common carotid artery shows atherosclerotic plaque with stenosis estimated at 30-50% in the midportion. At the bifurcation, there is complex soft and calcified plaque. Minimal diameter of the proximal ICA is 3 mm. Compared to a more distal cervical ICA diameter of 5 mm, this indicates a 40% stenosis. Vertebral arteries: There is atherosclerotic plaque at both vertebral artery origins. Detail is somewhat limited by chest density and breathing motion, but stenosis is estimated at 50-70% on both sides. Both vessels do show flow beyond that, without stenosis in the cervical region. Skeleton: Degenerative cervical spondylosis. Other neck: No soft tissue mass or lymphadenopathy. Upper chest: No active upper lung disease.  Some scarring. Review of the MIP images confirms the above findings CTA HEAD FINDINGS Anterior circulation: Both internal carotid arteries are patent through the skull base and siphon regions. There is siphon atherosclerotic calcification but no stenosis greater than 30%. The anterior and middle cerebral vessels show flow. A1 segment on the right is diminutive, with both anterior cerebral arteries receiving most of there supply from the left carotid circulation. No sign of large or medium vessel occlusion. Focal atherosclerotic plaque  affecting the distal M1 segment on the right with stenosis of 30-50%. Posterior circulation: Both vertebral arteries widely patent to the basilar. No basilar stenosis. Posterior circulation branch vessels are patent. No occlusion or flow limiting stenosis. Venous sinuses: Patent and normal. Anatomic variants: None significant. Review of the MIP images confirms the above findings IMPRESSION: No acute large or medium vessel occlusion. Atherosclerotic disease of both common carotid arteries with stenoses of up to 50% on both sides. Soft and calcified plaque at both carotid bifurcations. No measurable stenosis on the right. 40% stenosis of the proximal ICA on the left. 50-70% stenosis both vertebral artery origins. The vessels are widely patent beyond that however. Focal calcified plaque of the distal right M1 segment with stenosis estimated at 30-50%. Call report in progress. Electronically Signed   By: Nelson Chimes M.D.   On: 11/06/2019 13:42   CT ANGIO NECK W OR WO CONTRAST  Result Date: 11/06/2019 CLINICAL DATA:  Acute onset of bilateral upper and lower extremities paresthesia. EXAM: CT ANGIOGRAPHY HEAD AND NECK TECHNIQUE: Multidetector CT imaging of the head and neck was performed using the standard protocol during bolus administration of intravenous contrast. Multiplanar CT image reconstructions and MIPs were obtained to evaluate the vascular anatomy. Carotid stenosis measurements (when applicable) are obtained utilizing NASCET criteria, using the distal internal carotid diameter as the denominator. CONTRAST:  56mL OMNIPAQUE IOHEXOL 350 MG/ML SOLN COMPARISON:  Head CT earlier same day. Previous CT angiography 08/22/2016 FINDINGS: CTA NECK FINDINGS Aortic arch: Aortic atherosclerosis. No aneurysm or dissection. Branching pattern is normal. Right carotid system: Common carotid artery shows atherosclerotic plaque. At the level of the distal common carotid artery, there is focal plaque with stenosis of 50%. At the  bifurcation, there is soft and calcified plaque but no stenosis of the proximal ICA compared to the more distal cervical ICA diameter. Left carotid system: Common carotid artery shows atherosclerotic plaque with stenosis estimated at 30-50% in the midportion. At the bifurcation, there is complex soft  and calcified plaque. Minimal diameter of the proximal ICA is 3 mm. Compared to a more distal cervical ICA diameter of 5 mm, this indicates a 40% stenosis. Vertebral arteries: There is atherosclerotic plaque at both vertebral artery origins. Detail is somewhat limited by chest density and breathing motion, but stenosis is estimated at 50-70% on both sides. Both vessels do show flow beyond that, without stenosis in the cervical region. Skeleton: Degenerative cervical spondylosis. Other neck: No soft tissue mass or lymphadenopathy. Upper chest: No active upper lung disease.  Some scarring. Review of the MIP images confirms the above findings CTA HEAD FINDINGS Anterior circulation: Both internal carotid arteries are patent through the skull base and siphon regions. There is siphon atherosclerotic calcification but no stenosis greater than 30%. The anterior and middle cerebral vessels show flow. A1 segment on the right is diminutive, with both anterior cerebral arteries receiving most of there supply from the left carotid circulation. No sign of large or medium vessel occlusion. Focal atherosclerotic plaque affecting the distal M1 segment on the right with stenosis of 30-50%. Posterior circulation: Both vertebral arteries widely patent to the basilar. No basilar stenosis. Posterior circulation branch vessels are patent. No occlusion or flow limiting stenosis. Venous sinuses: Patent and normal. Anatomic variants: None significant. Review of the MIP images confirms the above findings IMPRESSION: No acute large or medium vessel occlusion. Atherosclerotic disease of both common carotid arteries with stenoses of up to 50% on  both sides. Soft and calcified plaque at both carotid bifurcations. No measurable stenosis on the right. 40% stenosis of the proximal ICA on the left. 50-70% stenosis both vertebral artery origins. The vessels are widely patent beyond that however. Focal calcified plaque of the distal right M1 segment with stenosis estimated at 30-50%. Call report in progress. Electronically Signed   By: Nelson Chimes M.D.   On: 11/06/2019 13:42   CT HEAD CODE STROKE WO CONTRAST  Result Date: 11/06/2019 CLINICAL DATA:  Code stroke. Acute onset of bilateral upper and lower extremity paresthesias, headache, and nausea. Personal history of CVA. Symptoms started 2 hours ago. Hypertension. Right-sided grip weakness. EXAM: CT HEAD WITHOUT CONTRAST TECHNIQUE: Contiguous axial images were obtained from the base of the skull through the vertex without intravenous contrast. COMPARISON:  CT head without contrast 09/29/2019. FINDINGS: Brain: Remote right parietal lobe infarct is again noted. Ex vacuo dilation of the right lateral ventricle is present. Moderate diffuse white matter disease is stable. The basal ganglia are intact. Acute or focal cortical infarct is present otherwise. Insular ribbon is normal bilaterally. No acute hemorrhage or mass lesion is present. The ventricles are proportionate to the degree of atrophy. No significant extraaxial fluid collection is present. The brainstem and cerebellum are within normal limits. Vascular: Atherosclerotic calcification is present at the right MCA bifurcation, stable. Additional calcifications are present within the cavernous internal carotid arteries bilaterally. No hyperdense vessel is present. Skull: Calvarium is intact. No focal lytic or blastic lesions are present. No significant extracranial soft tissue lesion is present. Sinuses/Orbits: The paranasal sinuses and mastoid air cells are clear. Bilateral lens replacements are noted. Globes and orbits are otherwise unremarkable. ASPECTS  [facility] Stroke Program Early CT Score) - Ganglionic level infarction (caudate, lentiform nuclei, internal capsule, insula, M1-M3 cortex): 7/7 - Supraganglionic infarction (M4-M6 cortex): 3/3 Total score (0-10 with 10 being normal): 10/10 IMPRESSION: 1. Remote right parietal lobe infarct. 2. No acute abnormality. 3. Moderate diffuse white matter disease likely reflects sequela of chronic microvascular ischemia. 4. ASPECTS is  10/10 These results were called by telephone at the time of interpretation on 11/06/2019 at 12:43 pm to provider Duffy Bruce , who verbally acknowledged these results. Electronically Signed   By: San Morelle M.D.   On: 11/06/2019 12:46    EKG: Independently reviewed. A,flutter on reading however sinus rhythm 107.   Assessment/Plan Active Problems:   Essential hypertension   Hypothyroidism   Tobacco abuse   COPD (chronic obstructive pulmonary disease) (HCC)   Iron deficiency anemia   History of CVA (cerebrovascular accident)   Subarachnoid hemorrhage (HCC)  Dizziness/ H/O CVA on plavix only: Suspected tia/rec cva  Admit to telemetry Asa/plavix for 3 weeks until April 10 th then resume plavix as monotherapy.\cont statin therapy. Permissive htn for perfusion. Neuro check/ swallow screen/ PT/OT. Cardiac enzymes/ 2d echo,  HTN Goal SBP 123456 systolic.  Hypothyroidism: Continue home regimen with levothyroxine  TFT;s  Tobacco abuse: Pt is cutting back.  COPD Stable. Prn albuterol/duoneb prn  H/O iron def anemia Monitor cbc.  Subarachnoid hemorrhage from fall in past.   DVT prophylaxis: SCD's /Heparin Allergy. Code Status: Full Family Communication: Daughter at bedside Disposition Plan: D/C home with home PT. Consults called: Dr.Reynolds Neurology. Admission status: Observation  Para Skeans MD [facility] If 7PM-7AM, please contact night-coverage [email or web address] Password [facility]  11/06/2019, 3:55 PM

## 2019-11-06 NOTE — Consult Note (Signed)
Requesting Physician: Ellender Hose    Chief Complaint: Dizziness  I have been asked by Dr. Ellender Hose to see this patient in consultation for code stroke.  HPI: [name] Douglas is an 79 y.o. female with a history of stroke, carotid stenosis, HTN, HLD who reports she was driving today and noted acute onset of dizziness and then started to have paresthesias throughout her body.  Soon noted development of a headache as well. Symptoms persisted and she presented for evaluation.  On arrival to the ED symptoms were improving.  Headache and paresthesias had resolved and dizziness was improving as well.  Initial NIHSS of 0.  Patient on Plavix prior to presentation.     Date last known well: 11/06/2019 Time last known well: Time: 10:30 tPA Given: No: Improving, minimal symptoms  Past Medical History:  Diagnosis Date  . Anxiety   . Arthritis    "shoulders" (08/20/2014)  . Asthma   . Chronic lower back pain   . Complication of anesthesia    "agitated & restless after knee replacement in 2008"  . COPD (chronic obstructive pulmonary disease) ([place])   . DDD (degenerative disc disease), lumbar   . Depression   . Diverticulosis   . GERD (gastroesophageal reflux disease)   . High cholesterol   . Hypertension   . Hypothyroidism   . On home oxygen therapy    "2L at night" (08/20/2014)  . Shortness of breath dyspnea    with exertion  . Stroke [facility])     Past Surgical History:  Procedure Laterality Date  . CARDIAC CATHETERIZATION  ~ 2007  . EXCISIONAL HEMORRHOIDECTOMY  1960's  . EYE SURGERY Bilateral    Cataract extraction with IOL  . JOINT REPLACEMENT Left 2009  . KNEE ARTHROSCOPY Left X 2 <2008  . KNEE ARTHROSCOPY WITH SUBCHONDROPLASTY Right 04/19/2016   Procedure: KNEE ARTHROSCOPY WITH SUBCHONDROPLASTY, PARTIAL MENISCECTOMY;  Surgeon: Hessie Knows, MD;  Location: [facility] ORS;  Service: Orthopedics;  Laterality: Right;  . LOOP RECORDER INSERTION N/A 01/02/2018   Procedure: LOOP RECORDER INSERTION;  Surgeon:  Deboraha Sprang, MD;  Location: [place] [facility];  Service: Cardiovascular;  Laterality: N/A;  . SHOULDER OPEN ROTATOR CUFF REPAIR Right 2001  . TONSILLECTOMY  ~ 1950  . TOTAL KNEE ARTHROPLASTY Left 2008  . TOTAL KNEE ARTHROPLASTY Right 07/03/2016   Procedure: TOTAL KNEE ARTHROPLASTY;  Surgeon: Hessie Knows, MD;  Location: [facility] ORS;  Service: Orthopedics;  Laterality: Right;  . TUBAL LIGATION  1973  . VAGINAL HYSTERECTOMY  1974    Family History  Problem Relation Age of Onset  . Diabetes Mother   . Hypertension Mother   . Lung cancer Mother   . Congestive Heart Failure Father   . Congestive Heart Failure Sister    Social History:  reports that she has been smoking cigarettes. She has smoked for the past 53.00 years. She has never used smokeless tobacco. She reports that she does not drink alcohol or use drugs.  Allergies:  Allergies  Allergen Reactions  . Heparin Nausea And Vomiting    Medications: I have reviewed the patient's current medications. Prior to Admission medications   Medication Sig Start Date End Date Taking? Authorizing Provider  acetaminophen (TYLENOL) 500 MG tablet Take 500 mg by mouth daily as needed.    [provider]  atorvastatin (LIPITOR) 40 MG tablet TAKE 2 TABLETS (80 MG TOTAL) BY MOUTH DAILY AT 6PM 04/06/19   Ronnell Freshwater, NP  calcium carbonate (OS-CAL) 1250 (500 Ca) MG  chewable tablet Chew 1 tablet by mouth daily.     [provider]  clopidogrel (PLAVIX) 75 MG tablet Take 1 tablet (75 mg total) by mouth daily. 06/25/19   Ronnell Freshwater, NP  Crisaborole (EUCRISA) 2 % OINT Apply 1 application topically 2 (two) times daily. 10/26/19   Ronnell Freshwater, NP  diphenhydrAMINE HCl, Sleep, (SLEEP AID) 25 MG CAPS Take by mouth.     [provider]  docusate sodium (COLACE) 100 MG capsule Take 100 mg by mouth at bedtime.    [provider]  donepezil (ARICEPT) 5 MG tablet Take 1 tablet (5 mg total) by mouth at bedtime.  04/03/19   Ronnell Freshwater, NP  ergocalciferol (DRISDOL) 1.25 MG (50000 UT) capsule Take 1 capsule (50,000 Units total) by mouth once a week. 05/15/19   Ronnell Freshwater, NP  levothyroxine (SYNTHROID) 100 MCG tablet TAKE 1.5 TABLETS (150 MCG TOTAL) BY MOUTH DAILY BEFORE BREAKFAST 03/31/19   Ronnell Freshwater, NP  losartan (COZAAR) 25 MG tablet Take 1 tablet (25 mg total) by mouth daily. 10/26/19   Ronnell Freshwater, NP  metoprolol succinate (TOPROL-XL) 50 MG 24 hr tablet Take 2 tablets (100 mg total) by mouth daily. 09/03/19   Ronnell Freshwater, NP  ondansetron (ZOFRAN) 4 MG tablet Take 1 tablet (4 mg total) by mouth every 8 (eight) hours as needed for nausea or vomiting. 03/31/19   Ronnell Freshwater, NP  pneumococcal 23 valent vaccine (PNEUMOVAX 23) 25 MCG/0.5ML injection Inject 0.38ml IM once 03/31/19   Ronnell Freshwater, NP  venlafaxine XR (EFFEXOR-XR) 75 MG 24 hr capsule TAKE 2 CAPSULES BY MOUTH DAILY. 04/06/19   Ronnell Freshwater, NP    ROS: History obtained from the patient  General ROS: negative for - chills, fatigue, fever, night sweats, weight gain or weight loss Psychological ROS: negative for - behavioral disorder, hallucinations, memory difficulties, mood swings or suicidal ideation Ophthalmic ROS: negative for - blurry vision, double vision, eye pain or loss of vision ENT ROS: negative for - epistaxis, nasal discharge, oral lesions, sore throat, tinnitus or vertigo Allergy and Immunology ROS: negative for - hives or itchy/watery eyes Hematological and Lymphatic ROS: negative for - bleeding problems, bruising or swollen lymph nodes Endocrine ROS: negative for - galactorrhea, hair pattern changes, polydipsia/polyuria or temperature intolerance Respiratory ROS: negative for - cough, hemoptysis, shortness of breath or wheezing Cardiovascular ROS: negative for - chest pain, dyspnea on exertion, edema or irregular heartbeat Gastrointestinal ROS: negative for - abdominal pain, diarrhea,  hematemesis, nausea/vomiting or stool incontinence Genito-Urinary ROS: negative for - dysuria, hematuria, incontinence or urinary frequency/urgency Musculoskeletal ROS: negative for - joint swelling or muscular weakness Neurological ROS: as noted in HPI Dermatological ROS: negative for rash and skin lesion changes  Physical Examination: Blood pressure (!) 243/94, temperature 98.1 F (36.7 C), temperature source Oral, resp. rate (!) 25.  HEENT-  Normocephalic, no lesions, without obvious abnormality.  Normal external eye and conjunctiva.  Normal TM's bilaterally.  Normal auditory canals and external ears. Normal external nose, mucus membranes and septum.  Normal pharynx. Cardiovascular- S1, S2 normal, pulses palpable throughout   Lungs- chest clear, no wheezing, rales, normal symmetric air entry Abdomen- soft, non-tender; bowel sounds normal; no masses,  no organomegaly Extremities- no edema Lymph-no adenopathy palpable Musculoskeletal-no joint tenderness, deformity or swelling Skin-warm and dry, no hyperpigmentation, vitiligo, or suspicious lesions  Neurological Examination   Mental Status: Alert, oriented, thought content appropriate.  Speech fluent without evidence of  aphasia.  Able to follow 3 step commands without difficulty. Cranial Nerves: II: Discs flat bilaterally; Visual fields grossly normal, pupils equal, round, reactive to light and accommodation III,IV, VI: ptosis not present, extra-ocular motions intact bilaterally V,VII: smile symmetric, facial light touch sensation normal bilaterally VIII: hearing normal bilaterally IX,X: gag reflex present XI: bilateral shoulder shrug XII: midline tongue extension Motor: Right : Upper extremity   5/5    Left:     Upper extremity   5/5  Lower extremity   5/5     Lower extremity   5/5 Tone and bulk:normal tone throughout; no atrophy noted Sensory: Pinprick and light touch intact throughout, bilaterally Deep Tendon Reflexes: Symmetric  throughout Plantars: Right: mute   Left: mute Cerebellar: Normal finger-to-nose and normal heel-to-shin testing bilaterally Gait: not tested due to safety concerns   Laboratory Studies:  Basic Metabolic Panel: Recent Labs  Lab 11/06/19 1218  NA 142  K 3.5  CL 103  CO2 28  GLUCOSE 100*  BUN 14  CREATININE 0.73  CALCIUM 9.4    Liver Function Tests: Recent Labs  Lab 11/06/19 1218  AST 21  ALT 14  ALKPHOS 94  BILITOT 0.6  PROT 7.2  ALBUMIN 4.2   No results for input(s): LIPASE, AMYLASE in the last 168 hours. No results for input(s): AMMONIA in the last 168 hours.  CBC: Recent Labs  Lab 11/06/19 1218  WBC 8.6  NEUTROABS 6.2  HGB 12.5  HCT 38.8  MCV 88.6  PLT 263    Cardiac Enzymes: No results for input(s): CKTOTAL, CKMB, CKMBINDEX, TROPONINI in the last 168 hours.  BNP: Invalid input(s): POCBNP  CBG: Recent Labs  Lab 11/06/19 1224  GLUCAP 87    Microbiology: Results for orders placed or performed during the hospital encounter of 08/22/16  MRSA PCR Screening     Status: None   Collection Time: 08/22/16  4:10 PM   Specimen: Nasal Mucosa; Nasopharyngeal  Result Value Ref Range Status   MRSA by PCR NEGATIVE NEGATIVE Final    Comment:        The GeneXpert MRSA Assay (FDA approved for NASAL specimens only), is one component of a comprehensive MRSA colonization surveillance program. It is not intended to diagnose MRSA infection nor to guide or monitor treatment for MRSA infections.     Coagulation Studies: Recent Labs    11/06/19 1218  LABPROT 12.9  INR 1.0    Urinalysis: No results for input(s): COLORURINE, LABSPEC, PHURINE, GLUCOSEU, HGBUR, BILIRUBINUR, KETONESUR, PROTEINUR, UROBILINOGEN, NITRITE, LEUKOCYTESUR in the last 168 hours.  Invalid input(s): APPERANCEUR  Lipid Panel:    Component Value Date/Time   CHOL 138 05/07/2019 1409   TRIG 106 05/07/2019 1409   HDL 49 05/07/2019 1409   CHOLHDL 4.2 12/21/2017 0449   VLDL 37  12/21/2017 0449   LDLCALC 70 05/07/2019 1409    HgbA1C:  Lab Results  Component Value Date   HGBA1C 6.2 (H) 12/20/2017    Urine Drug Screen:  No results found for: LABOPIA, COCAINSCRNUR, LABBENZ, AMPHETMU, THCU, LABBARB  Alcohol Level: No results for input(s): ETH in the last 168 hours.  Imaging: CT ANGIO HEAD W OR WO CONTRAST  Result Date: 11/06/2019 CLINICAL DATA:  Acute onset of bilateral upper and lower extremities paresthesia. EXAM: CT ANGIOGRAPHY HEAD AND NECK TECHNIQUE: Multidetector CT imaging of the head and neck was performed using the standard protocol during bolus administration of intravenous contrast. Multiplanar CT image reconstructions and MIPs were obtained to evaluate the vascular anatomy.  Carotid stenosis measurements (when applicable) are obtained utilizing NASCET criteria, using the distal internal carotid diameter as the denominator. CONTRAST:  74mL OMNIPAQUE IOHEXOL 350 MG/ML SOLN COMPARISON:  Head CT earlier same day. Previous CT angiography 08/22/2016 FINDINGS: CTA NECK FINDINGS Aortic arch: Aortic atherosclerosis. No aneurysm or dissection. Branching pattern is normal. Right carotid system: Common carotid artery shows atherosclerotic plaque. At the level of the distal common carotid artery, there is focal plaque with stenosis of 50%. At the bifurcation, there is soft and calcified plaque but no stenosis of the proximal ICA compared to the more distal cervical ICA diameter. Left carotid system: Common carotid artery shows atherosclerotic plaque with stenosis estimated at 30-50% in the midportion. At the bifurcation, there is complex soft and calcified plaque. Minimal diameter of the proximal ICA is 3 mm. Compared to a more distal cervical ICA diameter of 5 mm, this indicates a 40% stenosis. Vertebral arteries: There is atherosclerotic plaque at both vertebral artery origins. Detail is somewhat limited by chest density and breathing motion, but stenosis is estimated at 50-70%  on both sides. Both vessels do show flow beyond that, without stenosis in the cervical region. Skeleton: Degenerative cervical spondylosis. Other neck: No soft tissue mass or lymphadenopathy. Upper chest: No active upper lung disease.  Some scarring. Review of the MIP images confirms the above findings CTA HEAD FINDINGS Anterior circulation: Both internal carotid arteries are patent through the skull base and siphon regions. There is siphon atherosclerotic calcification but no stenosis greater than 30%. The anterior and middle cerebral vessels show flow. A1 segment on the right is diminutive, with both anterior cerebral arteries receiving most of there supply from the left carotid circulation. No sign of large or medium vessel occlusion. Focal atherosclerotic plaque affecting the distal M1 segment on the right with stenosis of 30-50%. Posterior circulation: Both vertebral arteries widely patent to the basilar. No basilar stenosis. Posterior circulation branch vessels are patent. No occlusion or flow limiting stenosis. Venous sinuses: Patent and normal. Anatomic variants: None significant. Review of the MIP images confirms the above findings IMPRESSION: No acute large or medium vessel occlusion. Atherosclerotic disease of both common carotid arteries with stenoses of up to 50% on both sides. Soft and calcified plaque at both carotid bifurcations. No measurable stenosis on the right. 40% stenosis of the proximal ICA on the left. 50-70% stenosis both vertebral artery origins. The vessels are widely patent beyond that however. Focal calcified plaque of the distal right M1 segment with stenosis estimated at 30-50%. Call report in progress. Electronically Signed   By: Nelson Chimes M.D.   On: 11/06/2019 13:42   CT ANGIO NECK W OR WO CONTRAST  Result Date: 11/06/2019 CLINICAL DATA:  Acute onset of bilateral upper and lower extremities paresthesia. EXAM: CT ANGIOGRAPHY HEAD AND NECK TECHNIQUE: Multidetector CT imaging of  the head and neck was performed using the standard protocol during bolus administration of intravenous contrast. Multiplanar CT image reconstructions and MIPs were obtained to evaluate the vascular anatomy. Carotid stenosis measurements (when applicable) are obtained utilizing NASCET criteria, using the distal internal carotid diameter as the denominator. CONTRAST:  24mL OMNIPAQUE IOHEXOL 350 MG/ML SOLN COMPARISON:  Head CT earlier same day. Previous CT angiography 08/22/2016 FINDINGS: CTA NECK FINDINGS Aortic arch: Aortic atherosclerosis. No aneurysm or dissection. Branching pattern is normal. Right carotid system: Common carotid artery shows atherosclerotic plaque. At the level of the distal common carotid artery, there is focal plaque with stenosis of 50%. At the bifurcation, there is  soft and calcified plaque but no stenosis of the proximal ICA compared to the more distal cervical ICA diameter. Left carotid system: Common carotid artery shows atherosclerotic plaque with stenosis estimated at 30-50% in the midportion. At the bifurcation, there is complex soft and calcified plaque. Minimal diameter of the proximal ICA is 3 mm. Compared to a more distal cervical ICA diameter of 5 mm, this indicates a 40% stenosis. Vertebral arteries: There is atherosclerotic plaque at both vertebral artery origins. Detail is somewhat limited by chest density and breathing motion, but stenosis is estimated at 50-70% on both sides. Both vessels do show flow beyond that, without stenosis in the cervical region. Skeleton: Degenerative cervical spondylosis. Other neck: No soft tissue mass or lymphadenopathy. Upper chest: No active upper lung disease.  Some scarring. Review of the MIP images confirms the above findings CTA HEAD FINDINGS Anterior circulation: Both internal carotid arteries are patent through the skull base and siphon regions. There is siphon atherosclerotic calcification but no stenosis greater than 30%. The anterior and  middle cerebral vessels show flow. A1 segment on the right is diminutive, with both anterior cerebral arteries receiving most of there supply from the left carotid circulation. No sign of large or medium vessel occlusion. Focal atherosclerotic plaque affecting the distal M1 segment on the right with stenosis of 30-50%. Posterior circulation: Both vertebral arteries widely patent to the basilar. No basilar stenosis. Posterior circulation branch vessels are patent. No occlusion or flow limiting stenosis. Venous sinuses: Patent and normal. Anatomic variants: None significant. Review of the MIP images confirms the above findings IMPRESSION: No acute large or medium vessel occlusion. Atherosclerotic disease of both common carotid arteries with stenoses of up to 50% on both sides. Soft and calcified plaque at both carotid bifurcations. No measurable stenosis on the right. 40% stenosis of the proximal ICA on the left. 50-70% stenosis both vertebral artery origins. The vessels are widely patent beyond that however. Focal calcified plaque of the distal right M1 segment with stenosis estimated at 30-50%. Call report in progress. Electronically Signed   By: Nelson Chimes M.D.   On: 11/06/2019 13:42   CT HEAD CODE STROKE WO CONTRAST  Result Date: 11/06/2019 CLINICAL DATA:  Code stroke. Acute onset of bilateral upper and lower extremity paresthesias, headache, and nausea. Personal history of CVA. Symptoms started 2 hours ago. Hypertension. Right-sided grip weakness. EXAM: CT HEAD WITHOUT CONTRAST TECHNIQUE: Contiguous axial images were obtained from the base of the skull through the vertex without intravenous contrast. COMPARISON:  CT head without contrast 09/29/2019. FINDINGS: Brain: Remote right parietal lobe infarct is again noted. Ex vacuo dilation of the right lateral ventricle is present. Moderate diffuse white matter disease is stable. The basal ganglia are intact. Acute or focal cortical infarct is present otherwise.  Insular ribbon is normal bilaterally. No acute hemorrhage or mass lesion is present. The ventricles are proportionate to the degree of atrophy. No significant extraaxial fluid collection is present. The brainstem and cerebellum are within normal limits. Vascular: Atherosclerotic calcification is present at the right MCA bifurcation, stable. Additional calcifications are present within the cavernous internal carotid arteries bilaterally. No hyperdense vessel is present. Skull: Calvarium is intact. No focal lytic or blastic lesions are present. No significant extracranial soft tissue lesion is present. Sinuses/Orbits: The paranasal sinuses and mastoid air cells are clear. Bilateral lens replacements are noted. Globes and orbits are otherwise unremarkable. ASPECTS [facility] Stroke Program Early CT Score) - Ganglionic level infarction (caudate, lentiform nuclei, internal capsule, insula, M1-M3  cortex): 7/7 - Supraganglionic infarction (M4-M6 cortex): 3/3 Total score (0-10 with 10 being normal): 10/10 IMPRESSION: 1. Remote right parietal lobe infarct. 2. No acute abnormality. 3. Moderate diffuse white matter disease likely reflects sequela of chronic microvascular ischemia. 4. ASPECTS is 10/10 These results were called by telephone at the time of interpretation on 11/06/2019 at 12:43 pm to provider Duffy Bruce , who verbally acknowledged these results. Electronically Signed   By: San Morelle M.D.   On: 11/06/2019 12:46    Assessment: 79 y.o. female with a history of stroke, carotid stenosis, HTN, HLD who presented with acute onset dizziness, headache and paresthesias.  BP elevated at 243/94.  Symptoms improved by the time of evaluation but patient reports she still "just does not feel right".  Code stroke called.  Head CT personally reviewed and shows no acute changes.  CTA performed and personally reviewed as well to rule out acute vertebrobasilar thrombus.  No evidence of LVO noted but patient with  multiple areas of vascular stenosis including both common carotids, left proximal ICA, right M1 and both vertebral origins with the most significant being at the vertebral origins at 50-70% with the vessels widely patent after the stenoses.   Suspect symptoms more likely related to elevated BP than to TIA/acute infarct.     Stroke Risk Factors - carotid stenosis, hyperlipidemia, hypertension and smoking  Plan: 1. Continue Plavix and statin.  Would add ASA 81 mg for 3 weeks before returning to Plavix 75mg  alone.  Would administer ASA 325mg  now.   2. Telemetry monitoring 3. Frequent neuro checks 4. BP control.  Would not be overly aggressive with BP control since patient with cerebral atherosclerosis and does likely need some elevation in BP for adequate cerebral perfusion.  Would likely benefit from decrease in SBP by about 20% though.   5. If symptoms continue to improve with improvement in BP, patient may follow up with neurology on an outpatient basis.     Alexis Goodell, MD Neurology [phone] 11/06/2019, 1:49 PM

## 2019-11-06 NOTE — ED Notes (Signed)
Assisted patient to tolilet

## 2019-11-07 ENCOUNTER — Observation Stay: Payer: Medicare Other

## 2019-11-07 ENCOUNTER — Encounter: Payer: Self-pay | Admitting: Internal Medicine

## 2019-11-07 DIAGNOSIS — R42 Dizziness and giddiness: Secondary | ICD-10-CM | POA: Diagnosis not present

## 2019-11-07 DIAGNOSIS — G459 Transient cerebral ischemic attack, unspecified: Secondary | ICD-10-CM | POA: Diagnosis not present

## 2019-11-07 DIAGNOSIS — E039 Hypothyroidism, unspecified: Secondary | ICD-10-CM | POA: Diagnosis not present

## 2019-11-07 DIAGNOSIS — I10 Essential (primary) hypertension: Secondary | ICD-10-CM | POA: Diagnosis not present

## 2019-11-07 LAB — LIPID PANEL
Cholesterol: 136 mg/dL (ref 0–200)
HDL: 50 mg/dL (ref >40)
LDL Cholesterol: 61 mg/dL (ref 0–99)
Total CHOL/HDL Ratio: 2.7 RATIO
Triglycerides: 124 mg/dL (ref <150)
VLDL: 25 mg/dL (ref 0–40)

## 2019-11-07 LAB — HEMOGLOBIN A1C
Hgb A1c MFr Bld: 6.1 % — ABNORMAL HIGH (ref 4.8–5.6)
Mean Plasma Glucose: 128.37 mg/dL

## 2019-11-07 LAB — SARS CORONAVIRUS 2 (TAT 6-24 HRS): SARS Coronavirus 2: NEGATIVE

## 2019-11-07 MED ORDER — ASPIRIN EC 81 MG PO TBEC: 81.0000 mg | DELAYED_RELEASE_TABLET | Freq: Every day | ORAL | 0 refills | Status: AC

## 2019-11-07 MED ORDER — ASPIRIN 81 MG PO CHEW
324.0000 mg | CHEWABLE_TABLET | Freq: Once | ORAL | Status: DC
  Filled 2019-11-07: qty 4

## 2019-11-07 MED ORDER — ASPIRIN EC 81 MG PO TBEC: 81.0000 mg | DELAYED_RELEASE_TABLET | Freq: Every day | ORAL | 2 refills | Status: DC

## 2019-11-07 MED ORDER — DIAZEPAM 5 MG PO TABS: 5.0000 mg | ORAL_TABLET | Freq: Once | ORAL | Status: DC

## 2019-11-07 MED ORDER — DIAZEPAM 5 MG PO TABS
ORAL_TABLET | ORAL | Status: AC
  Filled 2019-11-07: qty 1

## 2019-11-07 NOTE — Progress Notes (Signed)
Subjective: Patient reports feeling back to baseline today.  No dizziness.  No paresthesias.  No headache.  BP improved.    Objective: Current vital signs: BP (!) 170/50 (BP Location: Left Arm)   Pulse 79   Temp 98.1 F (36.7 C) (Oral)   Resp (!) 25   Ht 5\' 6"  (1.676 m)   Wt 94.4 kg   SpO2 92%   BMI 33.59 kg/m  Vital signs in last 24 hours: Temp:  [97.8 F (36.6 C)-98.6 F (37 C)] 98.1 F (36.7 C) (03/20 1049) Pulse Rate:  [73-97] 79 (03/20 0553) Resp:  [13-31] 25 (03/20 0343) BP: (145-243)/(43-157) 170/50 (03/20 1049) SpO2:  [92 %-99 %] 92 % (03/20 0553) Weight:  [94.4 kg] 94.4 kg (03/19 2339)  Intake/Output from previous day: No intake/output data recorded. Intake/Output this shift: No intake/output data recorded. Nutritional status:  Diet Order            Diet Carb Modified Fluid consistency: Thin; Room service appropriate? Yes  Diet effective now              Neurologic Exam: Mental Status: Alert, oriented, thought content appropriate.  Speech fluent without evidence of aphasia.  Able to follow 3 step commands without difficulty. Cranial Nerves: II: Discs flat bilaterally; Visual fields grossly normal, pupils equal, round, reactive to light and accommodation III,IV, VI: ptosis not present, extra-ocular motions intact bilaterally V,VII: smile symmetric, facial light touch sensation normal bilaterally VIII: hearing normal bilaterally IX,X: gag reflex present XI: bilateral shoulder shrug XII: midline tongue extension Motor: 5/5 throughout Sensory: Pinprick and light touch intact throughout, bilaterally   Lab Results: Basic Metabolic Panel: Recent Labs  Lab 11/06/19 1218  NA 142  K 3.5  CL 103  CO2 28  GLUCOSE 100*  BUN 14  CREATININE 0.73  CALCIUM 9.4    Liver Function Tests: Recent Labs  Lab 11/06/19 1218  AST 21  ALT 14  ALKPHOS 94  BILITOT 0.6  PROT 7.2  ALBUMIN 4.2   No results for input(s): LIPASE, AMYLASE in the last 168  hours. No results for input(s): AMMONIA in the last 168 hours.  CBC: Recent Labs  Lab 11/06/19 1218  WBC 8.6  NEUTROABS 6.2  HGB 12.5  HCT 38.8  MCV 88.6  PLT 263    Cardiac Enzymes: No results for input(s): CKTOTAL, CKMB, CKMBINDEX, TROPONINI in the last 168 hours.  Lipid Panel: Recent Labs  Lab 11/07/19 0608  CHOL 136  TRIG 124  HDL 50  CHOLHDL 2.7  VLDL 25  LDLCALC 61    CBG: Recent Labs  Lab 11/06/19 1224  GLUCAP 87    Microbiology: Results for orders placed or performed during the hospital encounter of 11/06/19  SARS CORONAVIRUS 2 (TAT 6-24 HRS) Nasopharyngeal Nasopharyngeal Swab     Status: None   Collection Time: 11/06/19 10:49 PM   Specimen: Nasopharyngeal Swab  Result Value Ref Range Status   SARS Coronavirus 2 NEGATIVE NEGATIVE Final    Comment: (NOTE) SARS-CoV-2 target nucleic acids are NOT DETECTED. The SARS-CoV-2 RNA is generally detectable in upper and lower respiratory specimens during the acute phase of infection. Negative results do not preclude SARS-CoV-2 infection, do not rule out co-infections with other pathogens, and should not be used as the sole basis for treatment or other patient management decisions. Negative results must be combined with clinical observations, patient history, and epidemiological information. The expected result is Negative. Fact Sheet for Patients: SugarRoll[employer]be Fact Sheet for Healthcare Providers: https://www.woods-mathews.com/ This  test is not yet approved or cleared by the [place] and  has been authorized for detection and/or diagnosis of SARS-CoV-2 by FDA under an Emergency Use Authorization (EUA). This EUA will remain  in effect (meaning this test can be used) for the duration of the COVID-19 declaration under Section 56 4(b)(1) of the Act, 21 U.S.C. section 360bbb-3(b)(1), unless the authorization is terminated or revoked sooner. Performed at [facility], [place] [address], [place], [place] [address]     Coagulation Studies: Recent Labs    11/06/19 11/19/16  LABPROT 12.9  INR 1.0    Imaging: CT ANGIO HEAD W OR WO CONTRAST  Result Date: 11/06/2019 CLINICAL DATA:  Acute onset of bilateral upper and lower extremities paresthesia. EXAM: CT ANGIOGRAPHY HEAD AND NECK TECHNIQUE: Multidetector CT imaging of the head and neck was performed using the standard protocol during bolus administration of intravenous contrast. Multiplanar CT image reconstructions and MIPs were obtained to evaluate the vascular anatomy. Carotid stenosis measurements (when applicable) are obtained utilizing NASCET criteria, using the distal internal carotid diameter as the denominator. CONTRAST:  15mL OMNIPAQUE IOHEXOL 350 MG/ML SOLN COMPARISON:  Head CT earlier same day. Previous CT angiography 08/22/2016 FINDINGS: CTA NECK FINDINGS Aortic arch: Aortic atherosclerosis. No aneurysm or dissection. Branching pattern is normal. Right carotid system: Common carotid artery shows atherosclerotic plaque. At the level of the distal common carotid artery, there is focal plaque with stenosis of 50%. At the bifurcation, there is soft and calcified plaque but no stenosis of the proximal ICA compared to the more distal cervical ICA diameter. Left carotid system: Common carotid artery shows atherosclerotic plaque with stenosis estimated at 30-50% in the midportion. At the bifurcation, there is complex soft and calcified plaque. Minimal diameter of the proximal ICA is 3 mm. Compared to a more distal cervical ICA diameter of 5 mm, this indicates a 40% stenosis. Vertebral arteries: There is atherosclerotic plaque at both vertebral artery origins. Detail is somewhat limited by chest density and breathing motion, but stenosis is estimated at 50-70% on both sides. Both vessels do show flow beyond that, without stenosis in the cervical region. Skeleton: Degenerative cervical spondylosis. Other neck: No  soft tissue mass or lymphadenopathy. Upper chest: No active upper lung disease.  Some scarring. Review of the MIP images confirms the above findings CTA HEAD FINDINGS Anterior circulation: Both internal carotid arteries are patent through the skull base and siphon regions. There is siphon atherosclerotic calcification but no stenosis greater than 30%. The anterior and middle cerebral vessels show flow. A1 segment on the right is diminutive, with both anterior cerebral arteries receiving most of there supply from the left carotid circulation. No sign of large or medium vessel occlusion. Focal atherosclerotic plaque affecting the distal M1 segment on the right with stenosis of 30-50%. Posterior circulation: Both vertebral arteries widely patent to the basilar. No basilar stenosis. Posterior circulation branch vessels are patent. No occlusion or flow limiting stenosis. Venous sinuses: Patent and normal. Anatomic variants: None significant. Review of the MIP images confirms the above findings IMPRESSION: No acute large or medium vessel occlusion. Atherosclerotic disease of both common carotid arteries with stenoses of up to 50% on both sides. Soft and calcified plaque at both carotid bifurcations. No measurable stenosis on the right. 40% stenosis of the proximal ICA on the left. 50-70% stenosis both vertebral artery origins. The vessels are widely patent beyond that however. Focal calcified plaque of the distal right M1 segment with stenosis estimated at  30-50%. Call report in progress. Electronically Signed   By: Nelson Chimes M.D.   On: 11/06/2019 13:42   CT ANGIO NECK W OR WO CONTRAST  Result Date: 11/06/2019 CLINICAL DATA:  Acute onset of bilateral upper and lower extremities paresthesia. EXAM: CT ANGIOGRAPHY HEAD AND NECK TECHNIQUE: Multidetector CT imaging of the head and neck was performed using the standard protocol during bolus administration of intravenous contrast. Multiplanar CT image reconstructions and  MIPs were obtained to evaluate the vascular anatomy. Carotid stenosis measurements (when applicable) are obtained utilizing NASCET criteria, using the distal internal carotid diameter as the denominator. CONTRAST:  51mL OMNIPAQUE IOHEXOL 350 MG/ML SOLN COMPARISON:  Head CT earlier same day. Previous CT angiography 08/22/2016 FINDINGS: CTA NECK FINDINGS Aortic arch: Aortic atherosclerosis. No aneurysm or dissection. Branching pattern is normal. Right carotid system: Common carotid artery shows atherosclerotic plaque. At the level of the distal common carotid artery, there is focal plaque with stenosis of 50%. At the bifurcation, there is soft and calcified plaque but no stenosis of the proximal ICA compared to the more distal cervical ICA diameter. Left carotid system: Common carotid artery shows atherosclerotic plaque with stenosis estimated at 30-50% in the midportion. At the bifurcation, there is complex soft and calcified plaque. Minimal diameter of the proximal ICA is 3 mm. Compared to a more distal cervical ICA diameter of 5 mm, this indicates a 40% stenosis. Vertebral arteries: There is atherosclerotic plaque at both vertebral artery origins. Detail is somewhat limited by chest density and breathing motion, but stenosis is estimated at 50-70% on both sides. Both vessels do show flow beyond that, without stenosis in the cervical region. Skeleton: Degenerative cervical spondylosis. Other neck: No soft tissue mass or lymphadenopathy. Upper chest: No active upper lung disease.  Some scarring. Review of the MIP images confirms the above findings CTA HEAD FINDINGS Anterior circulation: Both internal carotid arteries are patent through the skull base and siphon regions. There is siphon atherosclerotic calcification but no stenosis greater than 30%. The anterior and middle cerebral vessels show flow. A1 segment on the right is diminutive, with both anterior cerebral arteries receiving most of there supply from the  left carotid circulation. No sign of large or medium vessel occlusion. Focal atherosclerotic plaque affecting the distal M1 segment on the right with stenosis of 30-50%. Posterior circulation: Both vertebral arteries widely patent to the basilar. No basilar stenosis. Posterior circulation branch vessels are patent. No occlusion or flow limiting stenosis. Venous sinuses: Patent and normal. Anatomic variants: None significant. Review of the MIP images confirms the above findings IMPRESSION: No acute large or medium vessel occlusion. Atherosclerotic disease of both common carotid arteries with stenoses of up to 50% on both sides. Soft and calcified plaque at both carotid bifurcations. No measurable stenosis on the right. 40% stenosis of the proximal ICA on the left. 50-70% stenosis both vertebral artery origins. The vessels are widely patent beyond that however. Focal calcified plaque of the distal right M1 segment with stenosis estimated at 30-50%. Call report in progress. Electronically Signed   By: Nelson Chimes M.D.   On: 11/06/2019 13:42   CT HEAD CODE STROKE WO CONTRAST  Result Date: 11/06/2019 CLINICAL DATA:  Code stroke. Acute onset of bilateral upper and lower extremity paresthesias, headache, and nausea. Personal history of CVA. Symptoms started 2 hours ago. Hypertension. Right-sided grip weakness. EXAM: CT HEAD WITHOUT CONTRAST TECHNIQUE: Contiguous axial images were obtained from the base of the skull through the vertex without intravenous contrast. COMPARISON:  CT head without contrast 09/29/2019. FINDINGS: Brain: Remote right parietal lobe infarct is again noted. Ex vacuo dilation of the right lateral ventricle is present. Moderate diffuse white matter disease is stable. The basal ganglia are intact. Acute or focal cortical infarct is present otherwise. Insular ribbon is normal bilaterally. No acute hemorrhage or mass lesion is present. The ventricles are proportionate to the degree of atrophy. No  significant extraaxial fluid collection is present. The brainstem and cerebellum are within normal limits. Vascular: Atherosclerotic calcification is present at the right MCA bifurcation, stable. Additional calcifications are present within the cavernous internal carotid arteries bilaterally. No hyperdense vessel is present. Skull: Calvarium is intact. No focal lytic or blastic lesions are present. No significant extracranial soft tissue lesion is present. Sinuses/Orbits: The paranasal sinuses and mastoid air cells are clear. Bilateral lens replacements are noted. Globes and orbits are otherwise unremarkable. ASPECTS [facility] Stroke Program Early CT Score) - Ganglionic level infarction (caudate, lentiform nuclei, internal capsule, insula, M1-M3 cortex): 7/7 - Supraganglionic infarction (M4-M6 cortex): 3/3 Total score (0-10 with 10 being normal): 10/10 IMPRESSION: 1. Remote right parietal lobe infarct. 2. No acute abnormality. 3. Moderate diffuse white matter disease likely reflects sequela of chronic microvascular ischemia. 4. ASPECTS is 10/10 These results were called by telephone at the time of interpretation on 11/06/2019 at 12:43 pm to provider Duffy Bruce , who verbally acknowledged these results. Electronically Signed   By: San Morelle M.D.   On: 11/06/2019 12:46    Medications:  I have reviewed the patient's current medications. Scheduled: . atorvastatin  80 mg Oral QPM  . clopidogrel  75 mg Oral Daily  . donepezil  5 mg Oral QHS  . levothyroxine  150 mcg Oral QAC breakfast  . losartan  25 mg Oral Daily  . venlafaxine XR  150 mg Oral Daily    Assessment/Plan: 79 y.o. female with a history of stroke, carotid stenosis, HTN, HLD who presented with acute onset dizziness, headache and paresthesias.  BP elevated at 243/94.  Symptoms now resolved.  BP improved.  Head CT personally reviewed and shows no acute changes.  CTA performed and personally reviewed as well to rule out acute  vertebrobasilar thrombus.  No evidence of LVO noted but patient with multiple areas of vascular stenosis including both common carotids, left proximal ICA, right M1 and both vertebral origins with the most significant being at the vertebral origins at 50-70% with the vessels widely patent after the stenoses.   Suspect symptoms more likely related to elevated BP than to TIA/acute infarct.  Patient on Plavix prior to admission.    Recommendations: 1. Continue Plavix and statin.  Would add ASA 81 mg for 3 weeks before returning to Plavix 75mg  alone.  Would administer ASA 325mg  now.   2. Telemetry monitoring 3. Frequent neuro checks 4. BP control   LOS: 0 days   Alexis Goodell, MD Neurology [phone] 11/07/2019  10:52 AM

## 2019-11-07 NOTE — Discharge Summary (Signed)
Physician Discharge Summary  [name] [id number] DOB: Dec 13, 1940 DOA: 11/06/2019  PCP: [name] Guise, MD  Admit date: 11/06/2019 Discharge date: 11/07/2019  Time spent: 30 minutes   Discharge Diagnoses:  Active Problems:   Essential hypertension   Hypothyroidism   Tobacco abuse   COPD (chronic obstructive pulmonary disease) (HCC)   Iron deficiency anemia   History of CVA (cerebrovascular accident)   Subarachnoid hemorrhage ([name] Douglas)   Dizziness   Discharge Condition:  Good.   Diet recommendation:  Carb consistent/cardiac diet  Filed Weights   11/06/19 2339  Weight: 94.4 kg    History of present illness:  : [name] Douglas is a 79 y.o. female with medical history significant of history of stroke, carotid stenosis, HTN, HLD who reports she was driving today and noted acute onset of dizziness and then started to have paresthesias throughout her body. Pt also reports ongoing tobacco abuse although she is cutting back to few cigarettes a day. Pt was driving prior to her symptoms onset. No visual symptoms . Pt seen in ed for eval of dizziness and nausea. Pt was on her way to get her hair fixed, driving . Pt got a tingling and  Numbness or her neck and throat and torso and ue/ and le along with nausea,pt had dizziness and nausea and no chest pain no sob.pt had a headache that stated inside the salon.pt went inside and called EMS. Pt does not take any aspirin 81 mg , she was on plavix since her first stroke. Pt states she was told to take plavix not aspirin. In 2015 pt had BRBPR Pt saw dr. Tiffany Kocher and was told she had c.diff colitis.  ED Course: Blood pressure (!) 164/54, pulse 85, temperature 98.1 F (36.7 C), temperature source Oral, resp. rate (!) 23, SpO2 95 %. Pt in ED was seen by neurologist and code stroke was called in ed. Pt had hypertensive urgency , stat imaging studies showed negative noncontrast head ct, and cta of head showed  No large vessel occlusion but common  catoyid and vertebral artery  Hospital Course:  Pt admitted for dizziness and diaphoresis and admitted for observation  Pt was stable throughout the stay and pt had mmri of brain which was negative. Pt seen by neurology and plan was for pt to cont asa and Plavix for 3 weeks then Start plavix monotherapy. Pt did well with Physical therapy .  Consultations: Neurology Dr.Reynolds.  Discharge Exam: Vitals:   11/07/19 1212 11/07/19 1341  BP: (!) 168/43 (!) 142/53  Pulse: (!) 111 94  Resp:    Temp:    SpO2: 93% 96%   Eyes: PERRL,EOMI , no nystagmus, lids and conjunctivae normal ENMT: Mucous membranes are moist. Posterior pharynx clear of any exudate or lesions.Normal dentition.  Neck: normal, supple, no masses, no thyromegaly Respiratory: clear to auscultation bilaterally, no wheezing, no crackles. Normal respiratory effort. No accessory muscle use.  Cardiovascular: Regular rate and rhythm, + murmurs / rubs / gallops. No extremity edema. 2+ pedal pulses. No carotid bruits.  Abdomen: no tenderness, no masses palpated. No hepatosplenomegaly. Bowel sounds positive.  Musculoskeletal: no clubbing / cyanosis. No joint deformity upper and lower extremities. Good ROM, no contractures. Normal muscle tone.  Skin: no rashes, lesions, ulcers. No induration Neurologic: CN 2-12 grossly intact.  DTR normal. Strength 5/5 in all 4.  Psychiatric: Normal judgment and insight. Alert and oriented x 3. Normal mood.   Discharge Instructions Discharge Instructions    Call MD for:  Complete by: As directed    Any symptoms of chest pain shortness of breath tingling numbness or strokelike symptoms balance or visual problems.   Call MD for:  difficulty breathing, headache or visual disturbances   Complete by: As directed    Call MD for:  extreme fatigue   Complete by: As directed    Call MD for:  hives   Complete by: As directed    Call MD for:  persistant dizziness or light-headedness   Complete by: As  directed    Call MD for:  persistant nausea and vomiting   Complete by: As directed    Call MD for:  redness, tenderness, or signs of infection (pain, swelling, redness, odor or green/yellow discharge around incision site)   Complete by: As directed    Call MD for:  severe uncontrolled pain   Complete by: As directed    Call MD for:  temperature >100.4   Complete by: As directed    Diet - low sodium heart healthy   Complete by: As directed    Increase activity slowly   Complete by: As directed      Allergies as of 11/07/2019      Reactions   Heparin Nausea And Vomiting      Medication List    STOP taking these medications   levothyroxine 100 MCG tablet Commonly known as: SYNTHROID     TAKE these medications   acetaminophen 500 MG tablet Commonly known as: TYLENOL Take 500-1,000 mg by mouth every 6 (six) hours as needed for mild pain, moderate pain or fever.   aspirin EC 81 MG tablet Take 1 tablet (81 mg total) by mouth daily for 21 days.   atorvastatin 40 MG tablet Commonly known as: LIPITOR TAKE 2 TABLETS (80 MG TOTAL) BY MOUTH DAILY AT 6PM What changed:   how much to take  how to take this  when to take this  additional instructions   calcium carbonate 1250 (500 Ca) MG chewable tablet Commonly known as: OS-CAL Chew 1 tablet by mouth daily.   clopidogrel 75 MG tablet Commonly known as: PLAVIX Take 1 tablet (75 mg total) by mouth daily.   docusate sodium 100 MG capsule Commonly known as: COLACE Take 100 mg by mouth at bedtime.   donepezil 5 MG tablet Commonly known as: ARICEPT Take 1 tablet (5 mg total) by mouth at bedtime.   ergocalciferol 1.25 MG (50000 UT) capsule Commonly known as: Drisdol Take 1 capsule (50,000 Units total) by mouth once a week.   Eucrisa 2 % Oint Generic drug: Crisaborole Apply 1 application topically 2 (two) times daily.   losartan 25 MG tablet Commonly known as: Cozaar Take 1 tablet (25 mg total) by mouth daily.    metoprolol succinate 50 MG 24 hr tablet Commonly known as: TOPROL-XL Take 2 tablets (100 mg total) by mouth daily.   Sleep Aid 25 MG Caps Generic drug: diphenhydrAMINE HCl (Sleep) Take 25 mg by mouth at bedtime as needed (sleep or allergies).   venlafaxine XR 75 MG 24 hr capsule Commonly known as: EFFEXOR-XR TAKE 2 CAPSULES BY MOUTH DAILY. What changed:   how much to take  how to take this  when to take this  additional instructions      Allergies  Allergen Reactions  . Heparin Nausea And Vomiting    The results of significant diagnostics from this hospitalization (including imaging, microbiology, ancillary and laboratory) are listed below for reference.    Significant Diagnostic Studies:  CT ANGIO HEAD W OR WO CONTRAST  Result Date: 11/06/2019 CLINICAL DATA:  Acute onset of bilateral upper and lower extremities paresthesia. EXAM: CT ANGIOGRAPHY HEAD AND NECK TECHNIQUE: Multidetector CT imaging of the head and neck was performed using the standard protocol during bolus administration of intravenous contrast. Multiplanar CT image reconstructions and MIPs were obtained to evaluate the vascular anatomy. Carotid stenosis measurements (when applicable) are obtained utilizing NASCET criteria, using the distal internal carotid diameter as the denominator. CONTRAST:  31mL OMNIPAQUE IOHEXOL 350 MG/ML SOLN COMPARISON:  Head CT earlier same day. Previous CT angiography 08/22/2016 FINDINGS: CTA NECK FINDINGS Aortic arch: Aortic atherosclerosis. No aneurysm or dissection. Branching pattern is normal. Right carotid system: Common carotid artery shows atherosclerotic plaque. At the level of the distal common carotid artery, there is focal plaque with stenosis of 50%. At the bifurcation, there is soft and calcified plaque but no stenosis of the proximal ICA compared to the more distal cervical ICA diameter. Left carotid system: Common carotid artery shows atherosclerotic plaque with stenosis  estimated at 30-50% in the midportion. At the bifurcation, there is complex soft and calcified plaque. Minimal diameter of the proximal ICA is 3 mm. Compared to a more distal cervical ICA diameter of 5 mm, this indicates a 40% stenosis. Vertebral arteries: There is atherosclerotic plaque at both vertebral artery origins. Detail is somewhat limited by chest density and breathing motion, but stenosis is estimated at 50-70% on both sides. Both vessels do show flow beyond that, without stenosis in the cervical region. Skeleton: Degenerative cervical spondylosis. Other neck: No soft tissue mass or lymphadenopathy. Upper chest: No active upper lung disease.  Some scarring. Review of the MIP images confirms the above findings CTA HEAD FINDINGS Anterior circulation: Both internal carotid arteries are patent through the skull base and siphon regions. There is siphon atherosclerotic calcification but no stenosis greater than 30%. The anterior and middle cerebral vessels show flow. A1 segment on the right is diminutive, with both anterior cerebral arteries receiving most of there supply from the left carotid circulation. No sign of large or medium vessel occlusion. Focal atherosclerotic plaque affecting the distal M1 segment on the right with stenosis of 30-50%. Posterior circulation: Both vertebral arteries widely patent to the basilar. No basilar stenosis. Posterior circulation branch vessels are patent. No occlusion or flow limiting stenosis. Venous sinuses: Patent and normal. Anatomic variants: None significant. Review of the MIP images confirms the above findings IMPRESSION: No acute large or medium vessel occlusion. Atherosclerotic disease of both common carotid arteries with stenoses of up to 50% on both sides. Soft and calcified plaque at both carotid bifurcations. No measurable stenosis on the right. 40% stenosis of the proximal ICA on the left. 50-70% stenosis both vertebral artery origins. The vessels are widely  patent beyond that however. Focal calcified plaque of the distal right M1 segment with stenosis estimated at 30-50%. Call report in progress. Electronically Signed   By: Nelson Chimes M.D.   On: 11/06/2019 13:42   CT ANGIO NECK W OR WO CONTRAST  Result Date: 11/06/2019 CLINICAL DATA:  Acute onset of bilateral upper and lower extremities paresthesia. EXAM: CT ANGIOGRAPHY HEAD AND NECK TECHNIQUE: Multidetector CT imaging of the head and neck was performed using the standard protocol during bolus administration of intravenous contrast. Multiplanar CT image reconstructions and MIPs were obtained to evaluate the vascular anatomy. Carotid stenosis measurements (when applicable) are obtained utilizing NASCET criteria, using the distal internal carotid diameter as the denominator. CONTRAST:  31mL  OMNIPAQUE IOHEXOL 350 MG/ML SOLN COMPARISON:  Head CT earlier same day. Previous CT angiography 08/22/2016 FINDINGS: CTA NECK FINDINGS Aortic arch: Aortic atherosclerosis. No aneurysm or dissection. Branching pattern is normal. Right carotid system: Common carotid artery shows atherosclerotic plaque. At the level of the distal common carotid artery, there is focal plaque with stenosis of 50%. At the bifurcation, there is soft and calcified plaque but no stenosis of the proximal ICA compared to the more distal cervical ICA diameter. Left carotid system: Common carotid artery shows atherosclerotic plaque with stenosis estimated at 30-50% in the midportion. At the bifurcation, there is complex soft and calcified plaque. Minimal diameter of the proximal ICA is 3 mm. Compared to a more distal cervical ICA diameter of 5 mm, this indicates a 40% stenosis. Vertebral arteries: There is atherosclerotic plaque at both vertebral artery origins. Detail is somewhat limited by chest density and breathing motion, but stenosis is estimated at 50-70% on both sides. Both vessels do show flow beyond that, without stenosis in the cervical region.  Skeleton: Degenerative cervical spondylosis. Other neck: No soft tissue mass or lymphadenopathy. Upper chest: No active upper lung disease.  Some scarring. Review of the MIP images confirms the above findings CTA HEAD FINDINGS Anterior circulation: Both internal carotid arteries are patent through the skull base and siphon regions. There is siphon atherosclerotic calcification but no stenosis greater than 30%. The anterior and middle cerebral vessels show flow. A1 segment on the right is diminutive, with both anterior cerebral arteries receiving most of there supply from the left carotid circulation. No sign of large or medium vessel occlusion. Focal atherosclerotic plaque affecting the distal M1 segment on the right with stenosis of 30-50%. Posterior circulation: Both vertebral arteries widely patent to the basilar. No basilar stenosis. Posterior circulation branch vessels are patent. No occlusion or flow limiting stenosis. Venous sinuses: Patent and normal. Anatomic variants: None significant. Review of the MIP images confirms the above findings IMPRESSION: No acute large or medium vessel occlusion. Atherosclerotic disease of both common carotid arteries with stenoses of up to 50% on both sides. Soft and calcified plaque at both carotid bifurcations. No measurable stenosis on the right. 40% stenosis of the proximal ICA on the left. 50-70% stenosis both vertebral artery origins. The vessels are widely patent beyond that however. Focal calcified plaque of the distal right M1 segment with stenosis estimated at 30-50%. Call report in progress. Electronically Signed   By: Nelson Chimes M.D.   On: 11/06/2019 13:42   CT ANGIO NECK W OR WO CONTRAST  Result Date: 10/19/2019 CLINICAL DATA:  79 year old female with history of cerebral infarcts, carotid artery atherosclerosis. EXAM: CT ANGIOGRAPHY NECK TECHNIQUE: Multidetector CT imaging of the neck was performed using the standard protocol during bolus administration of  intravenous contrast. Multiplanar CT image reconstructions and MIPs were obtained to evaluate the vascular anatomy. Carotid stenosis measurements (when applicable) are obtained utilizing NASCET criteria, using the distal internal carotid diameter as the denominator. CONTRAST:  53mL OMNIPAQUE IOHEXOL 350 MG/ML SOLN COMPARISON:  Carotid Doppler ultrasound 12/20/2017. CTA head and neck 08/22/2016. FINDINGS: Skeleton: Absent dentition. Cervical spine degeneration appears stable since 2018. No acute osseous abnormality identified. Visualized paranasal sinuses and mastoids are stable and well pneumatized. Upper chest: Emphysema and mild upper lung scarring. Resolved patchy 12 mm opacity seen on the 2018 CTA. Partially visible left chest wall cardiac loop recorder. No superior mediastinal lymphadenopathy. Other neck: Negative. Aortic arch: Calcified aortic atherosclerosis. Three vessel arch configuration. Chronic proximal great vessel  atherosclerosis. Right carotid system: No brachiocephalic artery or right CCA origin stenosis despite plaque. Soft and calcified plaque in the proximal right CCA with less than 50 % stenosis with respect to the distal vessel. Soft and calcified plaque leading up to the right carotid bifurcation but no significant stenosis. Tortuous cervical right ICA with a kinked appearance at the C1-C2 level. Visible right ICA siphon is patent with calcified plaque but no significant stenosis. Left carotid system: Left CCA origin plaque without stenosis. Bulky calcified plaque in the left CCA at the thoracic inlet results in 70 % stenosis with respect to the distal vessel on series 6, image 68. Combined soft and calcified plaque leading up to the left carotid bifurcation although less than 50 % stenosis with respect to the distal vessel results. Tortuous left ICA with a kinked appearance at the C2-C3 level. Patent left ICA siphon with only minimal visible calcified plaque. Vertebral arteries: Chronic  proximal subclavian artery plaque. Absent motion artifact at the arch today, which seems to have simulated the appearance of 60-65% left subclavian artery origin stenosis previously. Bulky calcified plaque resulting in moderate right vertebral origin stenosis is stable. The right vertebral is tortuous but otherwise patent to the vertebrobasilar junction without additional stenosis. Patent right PICA origin. Patent visible basilar artery without plaque or stenosis. Severe left vertebral artery origin stenosis due to calcified plaque is stable. The left vertebral is then patent to the vertebrobasilar junction with mild tortuosity. The left AICA appears dominant. Review of the MIP images confirms the above findings IMPRESSION: 1. There is no significant carotid bifurcation stenosis despite combined soft and calcified plaque bilaterally. However, bulky calcified plaque does result in 70% stenosis of the Left CCA at the thoracic inlet. See series 6, image 68. 2. Motion artifact at the aortic arch in 2018 simulated the appearance of proximal left subclavian artery stenosis. However, there is genuine moderate to severe bilateral vertebral artery origin stenosis which appears stable. 3. Aortic Atherosclerosis (ICD10-I70.0) and Emphysema (ICD10-J43.9). Electronically Signed   By: Genevie Ann M.D.   On: 10/19/2019 15:37   MR BRAIN WO CONTRAST  Result Date: 11/07/2019 CLINICAL DATA:  Transient ischemic attack (TIA). EXAM: MRI HEAD WITHOUT CONTRAST TECHNIQUE: Multiplanar, multiecho pulse sequences of the brain and surrounding structures were obtained without intravenous contrast. COMPARISON:  CT angiogram head/neck 11/06/2019, noncontrast head CT 11/06/2019, head CT 09/29/2019, brain MRI 09/29/2019. FINDINGS: Brain: There is no evidence of acute infarct. No evidence of intracranial mass. No midline shift or extra-axial fluid collection. Redemonstrated chronic cortically based right parietal lobe infarct. Chronic petechial  hemorrhage at this site Moderate scattered and confluent T2/FLAIR hyperintensity within the cerebral white matter and pons is nonspecific, but consistent with chronic small vessel ischemic disease. Redemonstrated chronic lacunar infarcts within the right cerebellar hemisphere. There is curvilinear SWI signal loss along the bilateral frontal lobes consistent with superficial siderosis and suggestive of prior subarachnoid hemorrhage. A punctate microhemorrhage is also present within the central pons. Cerebral volume is normal for age. Vascular: Flow voids maintained within the proximal large arterial vessels. Skull and upper cervical spine: No focal marrow lesion. Sinuses/Orbits: Visualized orbits demonstrate no acute abnormality. Trace ethmoid sinus mucosal thickening. No significant mastoid effusion IMPRESSION: 1. No evidence of acute intracranial abnormality, including acute infarction. 2. Superficial siderosis along the bilateral frontal lobes suggestive of remote subarachnoid hemorrhage. 3. Punctate microhemorrhage within the central pons. 4. Moderate chronic small vessel ischemic changes within the cerebral white matter and pons. Redemonstrated chronic lacunar infarcts  within the right cerebellum. Electronically Signed   By: Kellie Simmering DO   On: 11/07/2019 13:04   CUP PACEART REMOTE DEVICE CHECK  Result Date: 10/18/2019 [facility] summary report received. Battery status OK. Normal device function. No new symptom episodes, tachy episodes, brady, or pause episodes. No new AF episodes. Monthly summary reports and ROV/PRN. SChancey  CT HEAD CODE STROKE WO CONTRAST  Result Date: 11/06/2019 CLINICAL DATA:  Code stroke. Acute onset of bilateral upper and lower extremity paresthesias, headache, and nausea. Personal history of CVA. Symptoms started 2 hours ago. Hypertension. Right-sided grip weakness. EXAM: CT HEAD WITHOUT CONTRAST TECHNIQUE: Contiguous axial images were obtained from the base of the skull through  the vertex without intravenous contrast. COMPARISON:  CT head without contrast 09/29/2019. FINDINGS: Brain: Remote right parietal lobe infarct is again noted. Ex vacuo dilation of the right lateral ventricle is present. Moderate diffuse white matter disease is stable. The basal ganglia are intact. Acute or focal cortical infarct is present otherwise. Insular ribbon is normal bilaterally. No acute hemorrhage or mass lesion is present. The ventricles are proportionate to the degree of atrophy. No significant extraaxial fluid collection is present. The brainstem and cerebellum are within normal limits. Vascular: Atherosclerotic calcification is present at the right MCA bifurcation, stable. Additional calcifications are present within the cavernous internal carotid arteries bilaterally. No hyperdense vessel is present. Skull: Calvarium is intact. No focal lytic or blastic lesions are present. No significant extracranial soft tissue lesion is present. Sinuses/Orbits: The paranasal sinuses and mastoid air cells are clear. Bilateral lens replacements are noted. Globes and orbits are otherwise unremarkable. ASPECTS [facility] Stroke Program Early CT Score) - Ganglionic level infarction (caudate, lentiform nuclei, internal capsule, insula, M1-M3 cortex): 7/7 - Supraganglionic infarction (M4-M6 cortex): 3/3 Total score (0-10 with 10 being normal): 10/10 IMPRESSION: 1. Remote right parietal lobe infarct. 2. No acute abnormality. 3. Moderate diffuse white matter disease likely reflects sequela of chronic microvascular ischemia. 4. ASPECTS is 10/10 These results were called by telephone at the time of interpretation on 11/06/2019 at 12:43 pm to provider Duffy Bruce , who verbally acknowledged these results. Electronically Signed   By: San Morelle M.D.   On: 11/06/2019 12:46    Microbiology: Recent Results (from the past 240 hour(s))  SARS CORONAVIRUS 2 (TAT 6-24 HRS) Nasopharyngeal Nasopharyngeal Swab     Status:  None   Collection Time: 11/06/19 10:49 PM   Specimen: Nasopharyngeal Swab  Result Value Ref Range Status   SARS Coronavirus 2 NEGATIVE NEGATIVE Final    Comment: (NOTE) SARS-CoV-2 target nucleic acids are NOT DETECTED. The SARS-CoV-2 RNA is generally detectable in upper and lower respiratory specimens during the acute phase of infection. Negative results do not preclude SARS-CoV-2 infection, do not rule out co-infections with other pathogens, and should not be used as the sole basis for treatment or other patient management decisions. Negative results must be combined with clinical observations, patient history, and epidemiological information. The expected result is Negative. Fact Sheet for Patients: SugarRoll.be Fact Sheet for Healthcare Providers: https://www.woods-mathews.com/ This test is not yet approved or cleared by the [place] FDA and  has been authorized for detection and/or diagnosis of SARS-CoV-2 by FDA under an Emergency Use Authorization (EUA). This EUA will remain  in effect (meaning this test can be used) for the duration of the COVID-19 declaration under Section 56 4(b)(1) of the Act, 21 U.S.C. section 360bbb-3(b)(1), unless the authorization is terminated or revoked sooner. Performed at [facility], [address] [place], [place] [address]      Labs: Basic Metabolic Panel: Recent Labs  Lab 11/06/19 1218  NA 142  K 3.5  CL 103  CO2 28  GLUCOSE 100*  BUN 14  CREATININE 0.73  CALCIUM 9.4   Liver Function Tests: Recent Labs  Lab 11/06/19 1218  AST 21  ALT 14  ALKPHOS 94  BILITOT 0.6  PROT 7.2  ALBUMIN 4.2   No results for input(s): LIPASE, AMYLASE in the last 168 hours. No results for input(s): AMMONIA in the last 168 hours. CBC: Recent Labs  Lab 11/06/19 1218  WBC 8.6  NEUTROABS 6.2  HGB 12.5  HCT 38.8  MCV 88.6  PLT 263   Cardiac Enzymes: No results for input(s): CKTOTAL, CKMB,  CKMBINDEX, TROPONINI in the last 168 hours. BNP: BNP (last 3 results) No results for input(s): BNP in the last 8760 hours.  ProBNP (last 3 results) No results for input(s): PROBNP in the last 8760 hours.  CBG: Recent Labs  Lab 11/06/19 1224  GLUCAP 87    Signed:  Para Skeans MD.  [facility] 11/07/2019, 2:08 PM

## 2019-11-07 NOTE — Evaluation (Signed)
Physical Therapy Evaluation Patient Details Name: [name] Douglas MRN: [id number] DOB: [birth year] Today's Date: 11/07/2019   History of Present Illness  79 y.o. female with a history of stroke, carotid stenosis, HTN, HLD who presented with acute onset dizziness, headache and paresthesias.  BP elevated at 243/94.  Symptoms improved by the time of evaluation but patient reports she still "just does not feel right".  Code stroke called.  Head CT personally reviewed and shows no acute changes.  CTA performed and personally reviewed as well to rule out acute vertebrobasilar thrombus.  Per neuro consult, suspect symptomatic elevated BP vs. TIA/stroke.  Clinical Impression  Pt is a pleasant 79 year old F who was admitted for above diagnoses. Pt performs bed mobility with mod I, transfers with mod I, and ambulation with CGA. Pt is independent at baseline with ADLs and limited community distances without AD secondary to shortness of breath; pt demos minimal shortness of breath during gait trial today, and educated to perform pursed lip breathing throughout. Pt ambulates 150' with RW with CGA for safety requiring VCs for safe RW management.  Pt demonstrates deficits with activity tolerance, strength and balance requiring continued skilled PT intervention to promote safe functional mobility.     Follow Up Recommendations Home health PT    Equipment Recommendations  None recommended by PT(Pt has rollator, 4ww, quad cane and SPC, as well as shower chair)    Recommendations for Other Services       Precautions / Restrictions Precautions Precautions: Fall      Mobility  Bed Mobility Overal bed mobility: Modified Independent             General bed mobility comments: Requiring extra time, with pt utilizing bedrails to upright trunk prior to pivoting to bedside; educated on log roll technique for improved comfort  Transfers Overall transfer level: Modified independent Equipment used: Rolling  walker (2 wheeled)             General transfer comment: Cuing to push up from bed vs. pulling on RW; otherwise, no cuing necessary  Ambulation/Gait Ambulation/Gait assistance: Min guard Gait Distance (Feet): 150 Feet Assistive device: Rolling walker (2 wheeled) Gait Pattern/deviations: Step-through pattern;Decreased step length - right;Decreased step length - left     General Gait Details: Small steps, positioned in hip ER throughout bilaterally; cues for upright gaze and for appropriate distance from RW (as pt is unfamiliar with RW use); instruction in pursed lip breathing to maximize o2 sat  Stairs            Wheelchair Mobility    Modified Rankin (Stroke Patients Only) Modified Rankin (Stroke Patients Only) Pre-Morbid Rankin Score: No significant disability Modified Rankin: No significant disability     Balance Overall balance assessment: Needs assistance Sitting-balance support: No upper extremity supported;Feet supported Sitting balance-Leahy Scale: Good Sitting balance - Comments: Able to sit steady edge of bed while reaching outside BOS   Standing balance support: During functional activity;No upper extremity supported Standing balance-Leahy Scale: Good Standing balance comment: Able to stand without UE support while adjusting gown                             Pertinent Vitals/Pain Pain Assessment: No/denies pain    Home Living Family/patient expects to be discharged to:: Private residence Living Arrangements: Alone Available Help at Discharge: Family Type of Home: Apartment Home Access: Elevator(pt reports having to negotiate 3 steps to get to car,  though she rarely uses these)     Home Layout: One level Home Equipment: [place] - 4 wheels;Cane - single point;Cane - quad;Shower seat Additional Comments: Lives on 4th floor apartment with elevator; reports 3 stairs with bil HRs to get to car but rarely uses these    Prior Function Level of  Independence: Independent         Comments: Ind with ADLs, household and short community distances (limited secondary to shortness of breath); reports has ADs but does not use them. Denies falls within 6 months.     Hand Dominance   Dominant Hand: Right    Extremity/Trunk Assessment   Upper Extremity Assessment Upper Extremity Assessment: Overall WFL for tasks assessed    Lower Extremity Assessment Lower Extremity Assessment: Overall WFL for tasks assessed;RLE deficits/detail;LLE deficits/detail RLE Deficits / Details: Sensation intact to light touch at bil LEs LLE Deficits / Details: Sensation intact to light touch at bil LEs    Cervical / Trunk Assessment Cervical / Trunk Assessment: Normal  Communication   Communication: No difficulties  Cognition   Behavior During Therapy: WFL for tasks assessed/performed Overall Cognitive Status: Within Functional Limits for tasks assessed                                        General Comments      Exercises Other Exercises Other Exercises: standing marches with UE support of RW   Assessment/Plan    PT Assessment Patient needs continued PT services  PT Problem List Decreased strength;Decreased mobility;Decreased safety awareness;Decreased activity tolerance;Decreased balance       PT Treatment Interventions DME instruction;Therapeutic exercise;Gait training;Balance training;Stair training;Neuromuscular re-education;Therapeutic activities;Patient/family education    PT Goals (Current goals can be found in the Care Plan section)  Acute Rehab PT Goals Patient Stated Goal: to figure out what's going on and go home PT Goal Formulation: With patient Time For Goal Achievement: 11/21/19 Potential to Achieve Goals: Good    Frequency Min 2X/week   Barriers to discharge        Co-evaluation               AM-PAC PT "6 Clicks" Mobility  Outcome Measure Help needed turning from your back to your side  while in a flat bed without using bedrails?: A Little Help needed moving from lying on your back to sitting on the side of a flat bed without using bedrails?: A Little Help needed moving to and from a bed to a chair (including a wheelchair)?: None Help needed standing up from a chair using your arms (e.g., wheelchair or bedside chair)?: None Help needed to walk in hospital room?: A Little Help needed climbing 3-5 steps with a railing? : A Little 6 Click Score: 20    End of Session Equipment Utilized During Treatment: Gait belt Activity Tolerance: Patient tolerated treatment well Patient left: in chair;with call bell/phone within reach;with chair alarm set;Other (comment)(transport arrives at end of session to take patient for MRI)   PT Visit Diagnosis: Unsteadiness on feet (R26.81);Difficulty in walking, not elsewhere classified (R26.2)    Time: [id number] PT Time Calculation (min) (ACUTE ONLY): 30 min   Charges:   PT Evaluation $PT Eval Moderate Complexity: 1 Mod PT Treatments $Gait Training: 8-22 mins        Petra Kuba, PT, DPT 11/07/19, 12:38 PM

## 2019-11-07 NOTE — Evaluation (Signed)
Occupational Therapy Evaluation Patient Details Name: [name] Douglas MRN: [id number] DOB: 04-21-1941 Today's Date: 11/07/2019    History of Present Illness 79 y.o. female with a history of stroke, carotid stenosis, HTN, HLD who presented with acute onset dizziness, headache and paresthesias.  BP elevated at 243/94.  Symptoms improved by the time of evaluation but patient reports she still "just does not feel right".  Code stroke called.  Head CT personally reviewed and shows no acute changes.  CTA performed and personally reviewed as well to rule out acute vertebrobasilar thrombus.  Per neuro consult, suspect symptomatic elevated BP vs. TIA/stroke.   Clinical Impression   [name] Douglas was seen for OT evaluation this date. Prior to hospital admission, pt was Independent with I/ADLs and community mobility, pt reports driving. Pt lives alone in [place] community apartments. Pt presents to acute OT demonstrating impaired ADL performance and functional mobility 2/2 decreased functional endurance, balance deficits, and decreased knowledge of DME. Pt currently requires SUP for seated ADLs and CGA for in room functional mobility.  Pt would benefit from skilled OT to address noted impairments and functional limitations (see below for any additional details) in order to maximize safety and independence while minimizing falls risk and caregiver burden. Upon hospital discharge, recommend HOT to maximize pt safety and return to functional independence during meaningful occupations of daily life.     Follow Up Recommendations  Home health OT;Supervision - Intermittent    Equipment Recommendations       Recommendations for Other Services       Precautions / Restrictions Precautions Precautions: Fall Restrictions Weight Bearing Restrictions: No      Mobility Bed Mobility Overal bed mobility: Modified Independent             General bed mobility comments: Increased time sup>sit from flat bed.    Transfers Overall transfer level: Needs assistance Equipment used: Rolling walker (2 wheeled) Transfers: Sit to/from [facility] Sit to Stand: Supervision Stand pivot transfers: Min guard       General transfer comment: SPT from bed to chair, VCs to reach back for arm rest     Balance Overall balance assessment: Needs assistance Sitting-balance support: No upper extremity supported;Feet supported Sitting balance-Leahy Scale: Good Sitting balance - Comments: Able to sit steady edge of bed while reaching outside BOS   Standing balance support: No upper extremity supported Standing balance-Leahy Scale: Good Standing balance comment: Able to stand without UE support while adjusting gown                           ADL either performed or assessed with clinical judgement   ADL Overall ADL's : Needs assistance/impaired                                       General ADL Comments: SUP don/doff B socks seated EOB. SUP self-feeding/drinking seated in recliner     Vision         Perception     Praxis      Pertinent Vitals/Pain Pain Assessment: No/denies pain     Hand Dominance Right   Extremity/Trunk Assessment Upper Extremity Assessment Upper Extremity Assessment: Overall WFL for tasks assessed   Lower Extremity Assessment Lower Extremity Assessment: Overall WFL for tasks assessed RLE Deficits / Details: Sensation intact to light touch at bil LEs LLE Deficits / Details:  Sensation intact to light touch at bil LEs   Cervical / Trunk Assessment Cervical / Trunk Assessment: Normal   Communication Communication Communication: No difficulties   Cognition Arousal/Alertness: Awake/alert Behavior During Therapy: WFL for tasks assessed/performed Overall Cognitive Status: Within Functional Limits for tasks assessed                                     General Comments       Exercises Exercises: Other  exercises Other Exercises Other Exercises: Pt educated re: falls prevention strategies, home/routine modifications, energy conservation strategies, OT role in acute setting, importance of OOB for improved functional strengthening/endurance, DME recommendations, safe t/f technique Other Exercises: LBD, sit<>stand, SPT, bed mobility, sup>sit from flat bed, sitting/standing balance/tolerance   Shoulder Instructions      Home Living Family/patient expects to be discharged to:: Private residence Living Arrangements: Alone Available Help at Discharge: Family Type of Home: Apartment Home Access: Elevator     Home Layout: One level     Bathroom Shower/Tub: Teacher, early years/pre: Standard Bathroom Accessibility: Yes How Accessible: Accessible via walker Home Equipment: Walker - 4 wheels;Cane - single point;Cane - quad;Shower seat;Grab bars - toilet;Grab bars - tub/shower;Hand held shower head   Additional Comments: Lives on 4th floor apartment with elevator; reports 3 stairs with bil HRs to get to car but rarely uses these      Prior Functioning/Environment Level of Independence: Independent        Comments: Ind with ADLs, household and short community distances (limited secondary to shortness of breath); reports has ADs but does not use them. Denies falls within 6 months.        OT Problem List: Decreased strength;Decreased range of motion;Decreased activity tolerance      OT Treatment/Interventions: Self-care/ADL training;Therapeutic exercise;Neuromuscular education;Energy conservation;DME and/or AE instruction;Therapeutic activities;Visual/perceptual remediation/compensation;Patient/family education;Balance training    OT Goals(Current goals can be found in the care plan section) Acute Rehab OT Goals Patient Stated Goal: to figure out what's going on and go home OT Goal Formulation: With patient Time For Goal Achievement: 11/21/19 Potential to Achieve Goals:  Good ADL Goals Pt Will Perform Lower Body Dressing: Independently;sit to/from stand Pt Will Perform Toileting - Clothing Manipulation and hygiene: Independently;sit to/from stand Additional ADL Goal #1: Pt will independently verbalize x3 falls prevention strategies  OT Frequency: Min 1X/week   Barriers to D/C: Decreased caregiver support          Co-evaluation              AM-PAC OT "6 Clicks" Daily Activity     Outcome Measure Help from another person eating meals?: None Help from another person taking care of personal grooming?: None Help from another person toileting, which includes using toliet, bedpan, or urinal?: A Little Help from another person bathing (including washing, rinsing, drying)?: A Little Help from another person to put on and taking off regular upper body clothing?: None Help from another person to put on and taking off regular lower body clothing?: A Little 6 Click Score: 21   End of Session    Activity Tolerance: Patient tolerated treatment well Patient left: in chair;with call bell/phone within reach;with chair alarm set  OT Visit Diagnosis: Other abnormalities of gait and mobility (R26.89)                [id number] OT Time Calculation (min): 29 min Charges:  OT  General Charges $OT Visit: 1 Visit OT Evaluation $OT Eval Low Complexity: 1 Low OT Treatments $Self Care/Home Management : 23-37 mins  Dessie Coma, M.S. OTR/L  11/07/19, 2:30 PM

## 2019-11-07 NOTE — Progress Notes (Signed)
SLP Cancellation Note  Patient Details Name: WRETHA LIEB MRN: [id number] DOB: [birth year]   Cancelled treatment:        Patient screened, no needs.  Patient at baseline for speech, language, and cognition.  Will sign off.   Leroy Sea, MS/CCC- SLP  Lou Miner 11/07/2019, 12:04 PM

## 2019-11-09 NOTE — Telephone Encounter (Signed)
[name] Douglas Could you set up appt with either MA or one of the apps to discuss initiating anticoagulation for afib detected on her loop-- this would also prompt the discontiuing of plavix  Thanks SK

## 2019-11-18 ENCOUNTER — Ambulatory Visit (INDEPENDENT_AMBULATORY_CARE_PROVIDER_SITE_OTHER): Payer: Medicare Other | Admitting: *Deleted

## 2019-11-18 DIAGNOSIS — I639 Cerebral infarction, unspecified: Secondary | ICD-10-CM | POA: Diagnosis not present

## 2019-11-18 LAB — CUP PACEART REMOTE DEVICE CHECK
Date Time Interrogation Session: 20210331033805
Implantable Pulse Generator Implant Date: 20190516

## 2019-11-18 NOTE — Progress Notes (Signed)
ILR Remote 

## 2019-11-19 ENCOUNTER — Ambulatory Visit: Payer: Medicare Other | Admitting: Nurse Practitioner

## 2019-11-19 ENCOUNTER — Telehealth: Payer: Self-pay

## 2019-11-19 LAB — CUP PACEART REMOTE DEVICE CHECK
Date Time Interrogation Session: 20210401000500
Implantable Pulse Generator Implant Date: 20190516

## 2019-11-19 NOTE — Telephone Encounter (Signed)
Called patient to assess for episodes of tachycardia. No answer. LMOVM. Direct number and hours provided.   Episode was noted on November 08, 2019 with max v rate of  200 bpm which lasted lasted 3 minutes 49 seconds. + Lopressor. No new AF episodes noted.

## 2019-11-20 NOTE — Telephone Encounter (Signed)
LMOVM for patient to call office back. Direct phone number provided along with office hours.

## 2019-11-23 NOTE — Telephone Encounter (Signed)
Patient returned phone call. Patient informed of tachy heart rate episodes. Patient states she does not recall feeling bad. Patient states she has been taking medications appropriately with no missed doses. Patient informed I will forward this information to Dr. Caryl Comes. Advised patient to call DC back if any questions or concerns should arise. Patient verbalizes understanding.   Routing to Dr. Caryl Comes.

## 2019-11-23 NOTE — Telephone Encounter (Signed)
SVT noted  no changes  Thanks SK

## 2019-11-30 ENCOUNTER — Other Ambulatory Visit: Payer: Self-pay

## 2019-11-30 DIAGNOSIS — E559 Vitamin D deficiency, unspecified: Secondary | ICD-10-CM

## 2019-11-30 MED ORDER — ERGOCALCIFEROL 1.25 MG (50000 UT) PO CAPS: 50000.0000 [IU] | ORAL_CAPSULE | ORAL | 1 refills | Status: DC

## 2019-11-30 MED ORDER — CLOPIDOGREL BISULFATE 75 MG PO TABS: 75.0000 mg | ORAL_TABLET | Freq: Every day | ORAL | 1 refills | Status: DC

## 2019-12-08 ENCOUNTER — Ambulatory Visit: Payer: Medicare Other | Admitting: Nurse Practitioner

## 2019-12-16 ENCOUNTER — Other Ambulatory Visit: Payer: Self-pay

## 2019-12-16 MED ORDER — ATORVASTATIN CALCIUM 80 MG PO TABS: 80.0000 mg | ORAL_TABLET | Freq: Every day | ORAL | 3 refills | Status: DC

## 2019-12-19 LAB — CUP PACEART REMOTE DEVICE CHECK
Date Time Interrogation Session: 20210501034057
Implantable Pulse Generator Implant Date: 20190516

## 2019-12-21 ENCOUNTER — Ambulatory Visit (INDEPENDENT_AMBULATORY_CARE_PROVIDER_SITE_OTHER): Payer: Medicare Other | Admitting: *Deleted

## 2019-12-21 DIAGNOSIS — R519 Headache, unspecified: Secondary | ICD-10-CM | POA: Insufficient documentation

## 2019-12-21 DIAGNOSIS — Z8673 Personal history of transient ischemic attack (TIA), and cerebral infarction without residual deficits: Secondary | ICD-10-CM | POA: Insufficient documentation

## 2019-12-21 DIAGNOSIS — I639 Cerebral infarction, unspecified: Secondary | ICD-10-CM | POA: Diagnosis not present

## 2019-12-22 NOTE — Progress Notes (Signed)
Carelink Summary Report / Loop Recorder 

## 2019-12-30 ENCOUNTER — Other Ambulatory Visit: Payer: Self-pay

## 2019-12-30 DIAGNOSIS — F411 Generalized anxiety disorder: Secondary | ICD-10-CM

## 2019-12-30 MED ORDER — VENLAFAXINE HCL ER 75 MG PO CP24: ORAL_CAPSULE | ORAL | 1 refills | Status: DC

## 2019-12-31 ENCOUNTER — Telehealth: Payer: Self-pay

## 2019-12-31 NOTE — Telephone Encounter (Signed)
Confirmed appointment on 01/04/2020 and screened for covid. klh 

## 2020-01-04 ENCOUNTER — Encounter: Payer: Self-pay | Admitting: Adult Health

## 2020-01-04 ENCOUNTER — Ambulatory Visit (INDEPENDENT_AMBULATORY_CARE_PROVIDER_SITE_OTHER): Payer: Medicare Other | Admitting: Adult Health

## 2020-01-04 ENCOUNTER — Telehealth: Payer: Self-pay

## 2020-01-04 ENCOUNTER — Other Ambulatory Visit: Payer: Self-pay

## 2020-01-04 VITALS — BP 140/85 | HR 72 | Temp 97.2°F | Resp 95 | Ht 66.0 in | Wt 206.2 lb

## 2020-01-04 DIAGNOSIS — M5442 Lumbago with sciatica, left side: Secondary | ICD-10-CM | POA: Diagnosis not present

## 2020-01-04 DIAGNOSIS — G4489 Other headache syndrome: Secondary | ICD-10-CM

## 2020-01-04 DIAGNOSIS — M62838 Other muscle spasm: Secondary | ICD-10-CM

## 2020-01-04 MED ORDER — PREDNISONE 10 MG PO TABS: ORAL_TABLET | ORAL | 0 refills | Status: DC

## 2020-01-04 MED ORDER — CYCLOBENZAPRINE HCL 5 MG PO TABS: 5.0000 mg | ORAL_TABLET | Freq: Every day | ORAL | 0 refills | Status: DC

## 2020-01-04 NOTE — Progress Notes (Signed)
[facility] [place], [place] [address]  Internal MEDICINE  Office Visit Note  Patient Name: [name] Douglas  [id number]  PT:[id number]  Date of Service: 01/04/2020  Chief Complaint  Patient presents with  . Hospitalization Follow-up  . Depression  . Gastroesophageal Reflux  . Hyperlipidemia  . Hypertension  . Back Pain    back spasams on left side    HPI  Patient is here for follow.  She saw Dr. Melrose Nakayama neurology on May 3rd 2021 for an initial visit due to headaches. She has a history of Depression, GERD, HTN, and HLD.  Her blood pressure  Is controlled. Her GERD is also controlled.    Current Medication: Outpatient Encounter Medications as of 01/04/2020  Medication Sig  . atorvastatin (LIPITOR) 80 MG tablet Take 1 tablet (80 mg total) by mouth daily at 6 PM.  . calcium carbonate (OS-CAL) 1250 (500 Ca) MG chewable tablet Chew 1 tablet by mouth daily.   . clopidogrel (PLAVIX) 75 MG tablet Take 1 tablet (75 mg total) by mouth daily.  . diphenhydrAMINE HCl, Sleep, (SLEEP AID) 25 MG CAPS Take 25 mg by mouth at bedtime as needed (sleep or allergies).   . docusate sodium (COLACE) 100 MG capsule Take 100 mg by mouth at bedtime.  . donepezil (ARICEPT) 5 MG tablet Take 1 tablet (5 mg total) by mouth at bedtime.  . ergocalciferol (DRISDOL) 1.25 MG (50000 UT) capsule Take 1 capsule (50,000 Units total) by mouth once a week.  . losartan (COZAAR) 25 MG tablet Take 1 tablet (25 mg total) by mouth daily.  . metoprolol succinate (TOPROL-XL) 50 MG 24 hr tablet Take 2 tablets (100 mg total) by mouth daily.  Marland Kitchen venlafaxine XR (EFFEXOR-XR) 75 MG 24 hr capsule TAKE 2 CAPSULES BY MOUTH DAILY.  . [DISCONTINUED] acetaminophen (TYLENOL) 500 MG tablet Take 500-1,000 mg by mouth every 6 (six) hours as needed for mild pain, moderate pain or fever.   . [DISCONTINUED] Crisaborole (EUCRISA) 2 % OINT Apply 1 application topically 2 (two) times daily. (Patient not taking: Reported on  11/06/2019)   No facility-administered encounter medications on file as of 01/04/2020.    Surgical History: Past Surgical History:  Procedure Laterality Date  . CARDIAC CATHETERIZATION  ~ 2007  . EXCISIONAL HEMORRHOIDECTOMY  1960's  . EYE SURGERY Bilateral    Cataract extraction with IOL  . JOINT REPLACEMENT Left 2009  . KNEE ARTHROSCOPY Left X 2 <2008  . KNEE ARTHROSCOPY WITH SUBCHONDROPLASTY Right 04/19/2016   Procedure: KNEE ARTHROSCOPY WITH SUBCHONDROPLASTY, PARTIAL MENISCECTOMY;  Surgeon: Hessie Knows, MD;  Location: [facility];  Service: Orthopedics;  Laterality: Right;  . LOOP RECORDER INSERTION N/A 01/02/2018   Procedure: LOOP RECORDER INSERTION;  Surgeon: Deboraha Sprang, MD;  Location: [facility];  Service: Cardiovascular;  Laterality: N/A;  . SHOULDER OPEN ROTATOR CUFF REPAIR Right 2001  . TONSILLECTOMY  ~ 1950  . TOTAL KNEE ARTHROPLASTY Left 2008  . TOTAL KNEE ARTHROPLASTY Right 07/03/2016   Procedure: TOTAL KNEE ARTHROPLASTY;  Surgeon: Hessie Knows, MD;  Location: [facility];  Service: Orthopedics;  Laterality: Right;  . TUBAL LIGATION  1973  . VAGINAL HYSTERECTOMY  1974    Medical History: Past Medical History:  Diagnosis Date  . Anxiety   . Arthritis    "shoulders" (08/20/2014)  . Asthma   . Chronic lower back pain   . Complication of anesthesia    "agitated & restless after knee replacement in 2008"  . COPD (  chronic obstructive pulmonary disease) ([place])   . DDD (degenerative disc disease), lumbar   . Depression   . Diverticulosis   . GERD (gastroesophageal reflux disease)   . High cholesterol   . Hypertension   . Hypothyroidism   . On home oxygen therapy    "2L at night" (08/20/2014)  . Pre-diabetes   . Shortness of breath dyspnea    with exertion  . Stroke [facility])     Family History: Family History  Problem Relation Age of Onset  . Diabetes Mother   . Hypertension Mother   . Lung cancer Mother   . Congestive Heart Failure Father   . Congestive  Heart Failure Sister     Social History   Socioeconomic History  . Marital status: Divorced    Spouse name: Not on file  . Number of children: Not on file  . Years of education: Not on file  . Highest education level: Not on file  Occupational History  . Not on file  Tobacco Use  . Smoking status: Current Some Day Smoker    Packs/day: 0.25    Years: 53.00    Pack years: 13.25    Types: Cigarettes  . Smokeless tobacco: Never Used  . Tobacco comment: 3 cigarettes in week  Substance and Sexual Activity  . Alcohol use: No  . Drug use: No  . Sexual activity: Never  Other Topics Concern  . Not on file  Social History Narrative  . Not on file   Social Determinants of Health   Financial Resource Strain:   . Difficulty of Paying Living Expenses:   Food Insecurity:   . Worried About Charity fundraiser in the Last Year:   . Arboriculturist in the Last Year:   Transportation Needs:   . Film/video editor (Medical):   Marland Kitchen Lack of Transportation (Non-Medical):   Physical Activity:   . Days of Exercise per Week:   . Minutes of Exercise per Session:   Stress:   . Feeling of Stress :   Social Connections:   . Frequency of Communication with Friends and Family:   . Frequency of Social Gatherings with Friends and Family:   . Attends Religious Services:   . Active Member of Clubs or Organizations:   . Attends Archivist Meetings:   Marland Kitchen Marital Status:   Intimate Partner Violence:   . Fear of Current or Ex-Partner:   . Emotionally Abused:   Marland Kitchen Physically Abused:   . Sexually Abused:       Review of Systems  Constitutional: Negative for chills, fatigue and unexpected weight change.  HENT: Negative for congestion, rhinorrhea, sneezing and sore throat.   Eyes: Negative for photophobia, pain and redness.  Respiratory: Negative for cough, chest tightness and shortness of breath.   Cardiovascular: Negative for chest pain and palpitations.  Gastrointestinal: Negative  for abdominal pain, constipation, diarrhea, nausea and vomiting.  Endocrine: Negative.   Genitourinary: Negative for dysuria and frequency.  Musculoskeletal: Negative for arthralgias, back pain, joint swelling and neck pain.  Skin: Negative for rash.  Allergic/Immunologic: Negative.   Neurological: Negative for tremors and numbness.  Hematological: Negative for adenopathy. Does not bruise/bleed easily.  Psychiatric/Behavioral: Negative for behavioral problems and sleep disturbance. The patient is not nervous/anxious.     Vital Signs: BP 140/85   Pulse 72   Temp (!) 97.2 F (36.2 C)   Resp (!) 95   Ht 5\' 6"  (1.676 m)   Wt  206 lb 3.2 oz (93.5 kg)   SpO2 (!) 16%   BMI 33.28 kg/m    Physical Exam Vitals and nursing note reviewed.  Constitutional:      General: She is not in acute distress.    Appearance: She is well-developed. She is not diaphoretic.  HENT:     Head: Normocephalic and atraumatic.     Mouth/Throat:     Pharynx: No oropharyngeal exudate.  Eyes:     Pupils: Pupils are equal, round, and reactive to light.  Neck:     Thyroid: No thyromegaly.     Vascular: No JVD.     Trachea: No tracheal deviation.  Cardiovascular:     Rate and Rhythm: Normal rate and regular rhythm.     Heart sounds: Normal heart sounds. No murmur. No friction rub. No gallop.   Pulmonary:     Effort: Pulmonary effort is normal. No respiratory distress.     Breath sounds: Normal breath sounds. No wheezing or rales.  Chest:     Chest wall: No tenderness.  Abdominal:     Palpations: Abdomen is soft.     Tenderness: There is no abdominal tenderness. There is no guarding.  Musculoskeletal:        General: Normal range of motion.     Cervical back: Normal range of motion and neck supple.  Lymphadenopathy:     Cervical: No cervical adenopathy.  Skin:    General: Skin is warm and dry.  Neurological:     Mental Status: She is alert and oriented to person, place, and time.     Cranial Nerves:  No cranial nerve deficit.  Psychiatric:        Behavior: Behavior normal.        Thought Content: Thought content normal.        Judgment: Judgment normal.    Assessment/Plan: 1. Other headache syndrome Have labs drawn and follow with neurology as directed.  - Sedimentation rate - C-reactive protein  2. Muscle spasm Use flexeril as directed.  - cyclobenzaprine (FLEXERIL) 5 MG tablet; Take 1 tablet (5 mg total) by mouth at bedtime.  Dispense: 15 tablet; Refill: 0  3. Low back pain with left-sided sciatica, unspecified back pain laterality, unspecified chronicity Use prednisone as directed. - predniSONE (DELTASONE) 10 MG tablet; Use per dose pack  Dispense: 21 tablet; Refill: 0  General Counseling: Lesleyann verbalizes understanding of the findings of todays visit and agrees with plan of treatment. I have discussed any further diagnostic evaluation that may be needed or ordered today. We also reviewed her medications today. she has been encouraged to call the office with any questions or concerns that should arise related to todays visit.    No orders of the defined types were placed in this encounter.   No orders of the defined types were placed in this encounter.   Time spent:30 Minutes   This patient was seen by Orson Gear AGNP-C in Collaboration with Dr Lavera Guise as a part of collaborative care agreement     Kendell Bane AGNP-C Internal medicine

## 2020-01-04 NOTE — Telephone Encounter (Signed)
PRIOR AUTHORIZATION FOR CYCLOBENZAPRINE HAS BEEN APPROVED.Marland Kitchen VALID FROM 10/06/19-04/03/20.TAT

## 2020-01-05 LAB — C-REACTIVE PROTEIN: CRP: 3 mg/L (ref 0–10)

## 2020-01-05 LAB — SEDIMENTATION RATE: Sed Rate: 17 mm/hr (ref 0–40)

## 2020-01-14 ENCOUNTER — Other Ambulatory Visit: Payer: Self-pay

## 2020-01-14 DIAGNOSIS — I10 Essential (primary) hypertension: Secondary | ICD-10-CM

## 2020-01-14 MED ORDER — METOPROLOL SUCCINATE ER 50 MG PO TB24: 100.0000 mg | ORAL_TABLET | Freq: Every day | ORAL | 0 refills | Status: DC

## 2020-01-14 MED ORDER — LOSARTAN POTASSIUM 25 MG PO TABS: 25.0000 mg | ORAL_TABLET | Freq: Every day | ORAL | 2 refills | Status: DC

## 2020-01-21 ENCOUNTER — Other Ambulatory Visit: Payer: Self-pay | Admitting: Adult Health

## 2020-01-21 ENCOUNTER — Other Ambulatory Visit: Payer: Self-pay

## 2020-01-21 DIAGNOSIS — E559 Vitamin D deficiency, unspecified: Secondary | ICD-10-CM

## 2020-01-21 DIAGNOSIS — I10 Essential (primary) hypertension: Secondary | ICD-10-CM

## 2020-01-21 DIAGNOSIS — M62838 Other muscle spasm: Secondary | ICD-10-CM

## 2020-01-21 MED ORDER — ERGOCALCIFEROL 1.25 MG (50000 UT) PO CAPS: 50000.0000 [IU] | ORAL_CAPSULE | ORAL | 1 refills | Status: DC

## 2020-01-21 MED ORDER — METOPROLOL SUCCINATE ER 50 MG PO TB24: 100.0000 mg | ORAL_TABLET | Freq: Every day | ORAL | 0 refills | Status: DC

## 2020-01-22 LAB — CUP PACEART REMOTE DEVICE CHECK
Date Time Interrogation Session: 20210603231304
Implantable Pulse Generator Implant Date: 20190516

## 2020-02-25 ENCOUNTER — Telehealth: Payer: Self-pay | Admitting: Emergency Medicine

## 2020-02-25 ENCOUNTER — Ambulatory Visit (INDEPENDENT_AMBULATORY_CARE_PROVIDER_SITE_OTHER): Payer: Medicare Other | Admitting: *Deleted

## 2020-02-25 DIAGNOSIS — I6310 Cerebral infarction due to embolism of unspecified precerebral artery: Secondary | ICD-10-CM

## 2020-02-25 LAB — CUP PACEART REMOTE DEVICE CHECK
Date Time Interrogation Session: 20210707233235
Implantable Pulse Generator Implant Date: 20190516

## 2020-02-25 NOTE — Telephone Encounter (Signed)
LMOM that need remote transmission from loop recorder. Device clinic # and office hours provided if assistance needed.

## 2020-02-26 NOTE — Telephone Encounter (Signed)
No answer/voicemail not set up.

## 2020-02-26 NOTE — Telephone Encounter (Signed)
LMOM (DPR) requesting manual transmission. Instructions provided. Direct DC number and office hours provided if pt needs assistance.

## 2020-02-29 ENCOUNTER — Other Ambulatory Visit: Payer: Self-pay

## 2020-02-29 DIAGNOSIS — M62838 Other muscle spasm: Secondary | ICD-10-CM

## 2020-02-29 MED ORDER — CYCLOBENZAPRINE HCL 5 MG PO TABS: 5.0000 mg | ORAL_TABLET | Freq: Every day | ORAL | 0 refills | Status: DC

## 2020-02-29 NOTE — Telephone Encounter (Signed)
Attempted to reach pt, no answer/ no VM on mobile number.  LVM on home phone requesting callback with device clinic phone number and hours.

## 2020-02-29 NOTE — Progress Notes (Signed)
Carelink Summary Report / Loop Recorder 

## 2020-03-01 NOTE — Telephone Encounter (Signed)
Follow up   Pt's daughter called, she said she received the message for pt and she will relay it to the pt. However, she say the best number to call pt is her mobile phone number on file.

## 2020-03-01 NOTE — Telephone Encounter (Signed)
Spoke with pt, walked her through sending a manual transmission.  She was having a hard time keeping monitor over device while holding phone so she will let monitor charge for a little and try again.

## 2020-03-02 NOTE — Telephone Encounter (Signed)
Manual/ full transmission received-  Pt has history of asymptomatic SVT noted on 4/1 phone encounter by MD with no changes.  Spoke with pt, she does not recall having any symptoms or days she did not feel well, she also confirms she is taking her meds as ordered, including metoprolol 100mg  daily.

## 2020-03-03 ENCOUNTER — Encounter: Payer: Self-pay | Admitting: *Deleted

## 2020-03-03 NOTE — Telephone Encounter (Signed)
Opened in error

## 2020-03-14 ENCOUNTER — Telehealth: Payer: Self-pay

## 2020-03-14 NOTE — Telephone Encounter (Signed)
Confirmed and screened for 03-15-20 ov. °

## 2020-03-15 ENCOUNTER — Encounter: Payer: Self-pay | Admitting: Nurse Practitioner

## 2020-03-15 ENCOUNTER — Ambulatory Visit (INDEPENDENT_AMBULATORY_CARE_PROVIDER_SITE_OTHER): Payer: Medicare Other | Admitting: Nurse Practitioner

## 2020-03-15 ENCOUNTER — Ambulatory Visit
Admission: RE | Admit: 2020-03-15 | Discharge: 2020-03-15 | Disposition: A | Discharge status: Home / Self Care | Payer: Medicare Other | Attending: Nurse Practitioner | Admitting: Nurse Practitioner

## 2020-03-15 ENCOUNTER — Other Ambulatory Visit: Payer: Self-pay

## 2020-03-15 ENCOUNTER — Ambulatory Visit
Admission: RE | Admit: 2020-03-15 | Discharge: 2020-03-15 | Disposition: A | Discharge status: Home / Self Care | Payer: Medicare Other | Source: Ambulatory Visit | Attending: Nurse Practitioner | Admitting: Nurse Practitioner

## 2020-03-15 VITALS — BP 156/76 | HR 79 | Temp 97.4°F | Ht 66.0 in | Wt 211.0 lb

## 2020-03-15 DIAGNOSIS — M5442 Lumbago with sciatica, left side: Secondary | ICD-10-CM

## 2020-03-15 DIAGNOSIS — M545 Low back pain: Secondary | ICD-10-CM | POA: Diagnosis not present

## 2020-03-15 DIAGNOSIS — R300 Dysuria: Secondary | ICD-10-CM | POA: Diagnosis not present

## 2020-03-15 DIAGNOSIS — N390 Urinary tract infection, site not specified: Secondary | ICD-10-CM

## 2020-03-15 DIAGNOSIS — R319 Hematuria, unspecified: Secondary | ICD-10-CM

## 2020-03-15 DIAGNOSIS — M62838 Other muscle spasm: Secondary | ICD-10-CM | POA: Diagnosis not present

## 2020-03-15 LAB — POCT URINALYSIS DIPSTICK
Glucose, UA: NEGATIVE
Nitrite, UA: NEGATIVE
Protein, UA: POSITIVE — AB
Spec Grav, UA: 1.025 (ref 1.010–1.025)
Urobilinogen, UA: 0.2 E.U./dL
pH, UA: 5 (ref 5.0–8.0)

## 2020-03-15 MED ORDER — NITROFURANTOIN MONOHYD MACRO 100 MG PO CAPS: 100.0000 mg | ORAL_CAPSULE | Freq: Two times a day (BID) | ORAL | 0 refills | Status: DC

## 2020-03-15 MED ORDER — PREDNISONE 10 MG PO TABS: ORAL_TABLET | ORAL | 0 refills | Status: DC

## 2020-03-15 MED ORDER — CYCLOBENZAPRINE HCL 5 MG PO TABS: 5.0000 mg | ORAL_TABLET | Freq: Every day | ORAL | 1 refills | Status: DC

## 2020-03-15 NOTE — Progress Notes (Signed)
[facility] [place], [place] [address]  Internal MEDICINE  Office Visit Note  Patient Name: [name] Douglas  [id number]  [id number]  Date of Service: 04/10/2020   Pt is here for a sick visit.  Chief Complaint  Patient presents with  . Back Pain     The patient presents for acute visit. Today, she is having moderate low back pain which radiates into the hips. She denies lower abdominal pain. She denies painful urination. She states that pain in her lower back is worse she stands or walks. Activity is limited due to the increased pain. She denies fever. She denies fall or injury.       Current Medication:  Outpatient Encounter Medications as of 03/15/2020  Medication Sig  . atorvastatin (LIPITOR) 80 MG tablet Take 1 tablet (80 mg total) by mouth daily at 6 PM.  . calcium carbonate (OS-CAL) 1250 (500 Ca) MG chewable tablet Chew 1 tablet by mouth daily.   . clopidogrel (PLAVIX) 75 MG tablet Take 1 tablet (75 mg total) by mouth daily.  . cyclobenzaprine (FLEXERIL) 5 MG tablet Take 1 tablet (5 mg total) by mouth at bedtime.  . diphenhydrAMINE HCl, Sleep, (SLEEP AID) 25 MG CAPS Take 25 mg by mouth at bedtime as needed (sleep or allergies).   . docusate sodium (COLACE) 100 MG capsule Take 100 mg by mouth at bedtime.  . donepezil (ARICEPT) 5 MG tablet Take 1 tablet (5 mg total) by mouth at bedtime.  Marland Kitchen losartan (COZAAR) 25 MG tablet Take 1 tablet (25 mg total) by mouth daily.  . metoprolol succinate (TOPROL-XL) 50 MG 24 hr tablet Take 2 tablets (100 mg total) by mouth daily.  . predniSONE (DELTASONE) 10 MG tablet Use per dose pack (Patient not taking: Reported on 04/07/2020)  . venlafaxine XR (EFFEXOR-XR) 75 MG 24 hr capsule TAKE 2 CAPSULES BY MOUTH DAILY. (Patient not taking: Reported on 04/07/2020)  . [DISCONTINUED] cyclobenzaprine (FLEXERIL) 5 MG tablet Take 1 tablet (5 mg total) by mouth at bedtime.  . [DISCONTINUED] ergocalciferol (DRISDOL) 1.25 MG (50000  UT) capsule Take 1 capsule (50,000 Units total) by mouth once a week.  . [DISCONTINUED] predniSONE (DELTASONE) 10 MG tablet Use per dose pack  . [DISCONTINUED] nitrofurantoin, macrocrystal-monohydrate, (MACROBID) 100 MG capsule Take 1 capsule (100 mg total) by mouth 2 (two) times daily.   No facility-administered encounter medications on file as of 03/15/2020.      Medical History: Past Medical History:  Diagnosis Date  . Anxiety   . Arthritis    "shoulders" (08/20/2014)  . Asthma   . Chronic lower back pain   . Complication of anesthesia    "agitated & restless after knee replacement in 2008"  . COPD (chronic obstructive pulmonary disease) ([place])   . DDD (degenerative disc disease), lumbar   . Depression   . Diverticulosis   . GERD (gastroesophageal reflux disease)   . High cholesterol   . Hypertension   . Hypothyroidism   . On home oxygen therapy    "2L at night" (08/20/2014)  . Pre-diabetes   . Shortness of breath dyspnea    with exertion  . Stroke ([place])      Today's Vitals   03/15/20 1407  BP: (!) 156/76  Pulse: 79  Temp: (!) 97.4 F (36.3 C)  SpO2: 95%  Weight: (!) 211 lb (95.7 kg)   Body mass index is 34.06 kg/m.  Review of Systems  Constitutional: Positive for appetite change and fatigue. Negative  for chills and unexpected weight change.       Decreased activity levels due to back pain.  HENT: Negative for congestion, postnasal drip, rhinorrhea, sneezing and sore throat.   Respiratory: Negative for cough, chest tightness, shortness of breath and wheezing.   Cardiovascular: Negative for chest pain and palpitations.       Elevated blood pressure.   Gastrointestinal: Negative for abdominal pain, constipation, diarrhea, nausea and vomiting.  Endocrine: Negative for cold intolerance, heat intolerance, polydipsia and polyuria.  Genitourinary: Negative for dysuria, frequency and urgency.  Musculoskeletal: Positive for back pain and myalgias. Negative for  arthralgias, joint swelling and neck pain.  Skin: Negative for rash.  Neurological: Negative for dizziness, tremors, numbness and headaches.  Hematological: Negative for adenopathy. Does not bruise/bleed easily.  Psychiatric/Behavioral: Negative for behavioral problems (Depression), sleep disturbance and suicidal ideas. The patient is nervous/anxious.     Physical Exam Vitals and nursing note reviewed.  Constitutional:      General: She is in acute distress.     Appearance: Normal appearance. She is well-developed. She is not diaphoretic.  HENT:     Head: Normocephalic and atraumatic.     Nose: Nose normal.     Mouth/Throat:     Pharynx: No oropharyngeal exudate.  Eyes:     Pupils: Pupils are equal, round, and reactive to light.  Neck:     Thyroid: No thyromegaly.     Vascular: No JVD.     Trachea: No tracheal deviation.  Cardiovascular:     Rate and Rhythm: Normal rate and regular rhythm.     Heart sounds: Normal heart sounds. No murmur heard.  No friction rub. No gallop.   Pulmonary:     Effort: Pulmonary effort is normal. No respiratory distress.     Breath sounds: Normal breath sounds. No wheezing or rales.  Chest:     Chest wall: No tenderness.  Abdominal:     General: Bowel sounds are normal.     Palpations: Abdomen is soft.     Tenderness: There is no abdominal tenderness.  Genitourinary:    Comments: Urine sample positive for protein, small blood, and small WBC Musculoskeletal:        General: Normal range of motion.     Cervical back: Normal range of motion and neck supple.     Comments: The patient has moderate lower back pain. This is worse with movement, standing, or walking. There are no palpable abnormalities or deformities present.   Lymphadenopathy:     Cervical: No cervical adenopathy.  Skin:    General: Skin is warm and dry.  Neurological:     Mental Status: She is alert and oriented to person, place, and time.     Cranial Nerves: No cranial nerve  deficit.  Psychiatric:        Mood and Affect: Mood normal.        Behavior: Behavior normal.        Thought Content: Thought content normal.        Judgment: Judgment normal.   Assessment/Plan: 1. Low back pain with left-sided sciatica, unspecified back pain laterality, unspecified chronicity Add prednisone dose pack. Take as directed for 6 days. Will get x-ray of the lumbar spine for further evaluation.  - predniSONE (DELTASONE) 10 MG tablet; Use per dose pack (Patient not taking: Reported on 04/07/2020)  Dispense: 21 tablet; Refill: 0 - DG Lumbar Spine Complete; Future  2. Muscle spasm May take flexeril 5mg  at bedtime as needed for  muscle pain/spasms.  - cyclobenzaprine (FLEXERIL) 5 MG tablet; Take 1 tablet (5 mg total) by mouth at bedtime.  Dispense: 30 tablet; Refill: 1  3. Urinary tract infection with hematuria, site unspecified Start macrobid 100mg  up to twice daily for five days. Send urine for culture and sensitivity and adjust antibiotics as indicated.   4. Dysuria Treat as infection and adjust treatment as indicated.  - POCT Urinalysis Dipstick  General Counseling: Sandy Salaam understanding of the findings of todays visit and agrees with plan of treatment. I have discussed any further diagnostic evaluation that may be needed or ordered today. We also reviewed her medications today. she has been encouraged to call the office with any questions or concerns that should arise related to todays visit.    Counseling:  This patient was seen by Leretha Pol FNP Collaboration with Dr Lavera Guise as a part of collaborative care agreement  Orders Placed This Encounter  Procedures  . DG Lumbar Spine Complete  . POCT Urinalysis Dipstick    Meds ordered this encounter  Medications  . predniSONE (DELTASONE) 10 MG tablet    Sig: Use per dose pack    Dispense:  21 tablet    Refill:  0    Delivery to patient please    Order Specific Question:   Supervising Provider     Answer:   Lavera Guise [[id number]]  . cyclobenzaprine (FLEXERIL) 5 MG tablet    Sig: Take 1 tablet (5 mg total) by mouth at bedtime.    Dispense:  30 tablet    Refill:  1    Please deliver to patient.    Order Specific Question:   Supervising Provider    Answer:   Lavera Guise [[id number]]  . DISCONTD: nitrofurantoin, macrocrystal-monohydrate, (MACROBID) 100 MG capsule    Sig: Take 1 capsule (100 mg total) by mouth 2 (two) times daily.    Dispense:  10 capsule    Refill:  0    Please deliver to patient .    Order Specific Question:   Supervising Provider    Answer:   Lavera Guise [[id number]]    Time spent: 30 Minutes

## 2020-03-15 NOTE — Progress Notes (Signed)
Discuss with patient at routine visit 04/07/2020. Patient is followed routinely due to prior CVA. Will discuss treatment plan.

## 2020-03-16 NOTE — Progress Notes (Signed)
Hey. Can you let the patient know that the x-ray of her lumbar spine showed severe degenerative arthritis with disc space narrowing at multiple levels. I would like to refer her to orthopedics ASAP for further evaluation and treatment. Thanks.

## 2020-03-17 ENCOUNTER — Telehealth: Payer: Self-pay

## 2020-03-17 NOTE — Telephone Encounter (Signed)
Pt advised we already send

## 2020-03-17 NOTE — Telephone Encounter (Signed)
I did send antibiotics. I sent macrobid 100mg  twice daily.

## 2020-03-29 ENCOUNTER — Ambulatory Visit (INDEPENDENT_AMBULATORY_CARE_PROVIDER_SITE_OTHER): Payer: Medicare Other | Admitting: *Deleted

## 2020-03-29 DIAGNOSIS — G459 Transient cerebral ischemic attack, unspecified: Secondary | ICD-10-CM

## 2020-03-29 LAB — CUP PACEART REMOTE DEVICE CHECK
Date Time Interrogation Session: 20210809233218
Implantable Pulse Generator Implant Date: 20190516

## 2020-03-31 ENCOUNTER — Other Ambulatory Visit: Payer: Self-pay

## 2020-03-31 DIAGNOSIS — E559 Vitamin D deficiency, unspecified: Secondary | ICD-10-CM

## 2020-03-31 DIAGNOSIS — M5442 Lumbago with sciatica, left side: Secondary | ICD-10-CM | POA: Diagnosis not present

## 2020-03-31 DIAGNOSIS — M5441 Lumbago with sciatica, right side: Secondary | ICD-10-CM | POA: Diagnosis not present

## 2020-03-31 DIAGNOSIS — G8929 Other chronic pain: Secondary | ICD-10-CM | POA: Diagnosis not present

## 2020-03-31 MED ORDER — ERGOCALCIFEROL 1.25 MG (50000 UT) PO CAPS: 50000.0000 [IU] | ORAL_CAPSULE | ORAL | 1 refills | Status: DC

## 2020-03-31 NOTE — Progress Notes (Signed)
Carelink Summary Report / Loop Recorder 

## 2020-04-01 ENCOUNTER — Other Ambulatory Visit: Payer: Self-pay | Admitting: Nurse Practitioner

## 2020-04-01 ENCOUNTER — Other Ambulatory Visit: Payer: Self-pay

## 2020-04-01 DIAGNOSIS — N390 Urinary tract infection, site not specified: Secondary | ICD-10-CM

## 2020-04-01 DIAGNOSIS — G8929 Other chronic pain: Secondary | ICD-10-CM

## 2020-04-01 DIAGNOSIS — R319 Hematuria, unspecified: Secondary | ICD-10-CM

## 2020-04-01 DIAGNOSIS — M5442 Lumbago with sciatica, left side: Secondary | ICD-10-CM

## 2020-04-01 MED ORDER — NITROFURANTOIN MONOHYD MACRO 100 MG PO CAPS: 100.0000 mg | ORAL_CAPSULE | Freq: Two times a day (BID) | ORAL | 0 refills | Status: DC

## 2020-04-05 ENCOUNTER — Telehealth: Payer: Self-pay

## 2020-04-05 NOTE — Telephone Encounter (Signed)
Confirmed and screened for 04-07-20 ov.

## 2020-04-07 ENCOUNTER — Ambulatory Visit (INDEPENDENT_AMBULATORY_CARE_PROVIDER_SITE_OTHER): Payer: Medicare Other | Admitting: Nurse Practitioner

## 2020-04-07 ENCOUNTER — Other Ambulatory Visit: Payer: Self-pay

## 2020-04-07 ENCOUNTER — Encounter: Payer: Self-pay | Admitting: Nurse Practitioner

## 2020-04-07 VITALS — BP 130/86 | HR 69 | Temp 97.1°F | Resp 16 | Ht 66.0 in | Wt 210.6 lb

## 2020-04-07 DIAGNOSIS — Z23 Encounter for immunization: Secondary | ICD-10-CM

## 2020-04-07 DIAGNOSIS — Z8673 Personal history of transient ischemic attack (TIA), and cerebral infarction without residual deficits: Secondary | ICD-10-CM

## 2020-04-07 DIAGNOSIS — G44011 Episodic cluster headache, intractable: Secondary | ICD-10-CM

## 2020-04-07 DIAGNOSIS — R300 Dysuria: Secondary | ICD-10-CM | POA: Diagnosis not present

## 2020-04-07 DIAGNOSIS — E785 Hyperlipidemia, unspecified: Secondary | ICD-10-CM | POA: Diagnosis not present

## 2020-04-07 DIAGNOSIS — I10 Essential (primary) hypertension: Secondary | ICD-10-CM | POA: Diagnosis not present

## 2020-04-07 DIAGNOSIS — Z0001 Encounter for general adult medical examination with abnormal findings: Secondary | ICD-10-CM

## 2020-04-07 MED ORDER — PNEUMOCOCCAL 13-VAL CONJ VACC IM SUSP: 0.5000 mL | Freq: Once | INTRAMUSCULAR | 0 refills | Status: AC

## 2020-04-07 NOTE — Progress Notes (Signed)
[facility] [place], [place] [address]  Internal MEDICINE  Office Visit Note  Patient Name: [name] Douglas  [id number]  [id number]  Date of Service: 04/24/2020   Pt is here for routine health maintenance examination   Chief Complaint  Patient presents with  . Medicare Wellness  . Hyperlipidemia  . Hypertension  . Depression  . Quality Metric Gaps    PNA, TDAP     The patient is here for health maintenance exam. She states that she is doing well. Her blood pressure is doing well. She states that she has stopped having frequent and severe headaches. She states that she is doing and feeling well. She is due to have vaccination against pneumonia.    Current Medication: Outpatient Encounter Medications as of 04/07/2020  Medication Sig  . atorvastatin (LIPITOR) 80 MG tablet Take 1 tablet (80 mg total) by mouth daily at 6 PM.  . calcium carbonate (OS-CAL) 1250 (500 Ca) MG chewable tablet Chew 1 tablet by mouth daily.   . clopidogrel (PLAVIX) 75 MG tablet Take 1 tablet (75 mg total) by mouth daily.  . cyclobenzaprine (FLEXERIL) 5 MG tablet Take 1 tablet (5 mg total) by mouth at bedtime.  . diphenhydrAMINE HCl, Sleep, (SLEEP AID) 25 MG CAPS Take 25 mg by mouth at bedtime as needed (sleep or allergies).   . docusate sodium (COLACE) 100 MG capsule Take 100 mg by mouth at bedtime.  . donepezil (ARICEPT) 5 MG tablet Take 1 tablet (5 mg total) by mouth at bedtime.  . ergocalciferol (DRISDOL) 1.25 MG (50000 UT) capsule Take 1 capsule (50,000 Units total) by mouth once a week.  . losartan (COZAAR) 25 MG tablet Take 1 tablet (25 mg total) by mouth daily.  . metoprolol succinate (TOPROL-XL) 50 MG 24 hr tablet Take 2 tablets (100 mg total) by mouth daily.  . nitrofurantoin, macrocrystal-monohydrate, (MACROBID) 100 MG capsule Take 1 capsule (100 mg total) by mouth 2 (two) times daily.  . [EXPIRED] pneumococcal 13-valent conjugate vaccine (PREVNAR 13) SUSP injection  Inject 0.5 mLs into the muscle once for 1 dose.  . predniSONE (DELTASONE) 10 MG tablet Use per dose pack (Patient not taking: Reported on 04/07/2020)  . venlafaxine XR (EFFEXOR-XR) 75 MG 24 hr capsule TAKE 2 CAPSULES BY MOUTH DAILY. (Patient not taking: Reported on 04/07/2020)  . [DISCONTINUED] acetaminophen (TYLENOL) 500 MG tablet Take 500-1,000 mg by mouth every 6 (six) hours as needed for mild pain, moderate pain or fever.   . [DISCONTINUED] atorvastatin (LIPITOR) 40 MG tablet TAKE 2 TABLETS (80 MG TOTAL) BY MOUTH DAILY AT 6PM (Patient not taking: Reported on 12/16/2019)  . [DISCONTINUED] clopidogrel (PLAVIX) 75 MG tablet Take 1 tablet (75 mg total) by mouth daily.  . [DISCONTINUED] Crisaborole (EUCRISA) 2 % OINT Apply 1 application topically 2 (two) times daily. (Patient not taking: Reported on 11/06/2019)  . [DISCONTINUED] donepezil (ARICEPT) 5 MG tablet Take 1 tablet (5 mg total) by mouth at bedtime.  . [DISCONTINUED] ergocalciferol (DRISDOL) 1.25 MG (50000 UT) capsule Take 1 capsule (50,000 Units total) by mouth once a week.  . [DISCONTINUED] levothyroxine (SYNTHROID) 100 MCG tablet TAKE 1.5 TABLETS (150 MCG TOTAL) BY MOUTH DAILY BEFORE BREAKFAST (Patient taking differently: Take 150 mcg by mouth daily before breakfast. )  . [DISCONTINUED] losartan (COZAAR) 25 MG tablet Take 1 tablet (25 mg total) by mouth daily.  . [DISCONTINUED] metoprolol succinate (TOPROL-XL) 50 MG 24 hr tablet Take 2 tablets (100 mg total) by mouth daily.  . [  DISCONTINUED] ondansetron (ZOFRAN) 4 MG tablet Take 1 tablet (4 mg total) by mouth every 8 (eight) hours as needed for nausea or vomiting.  . [DISCONTINUED] pneumococcal 23 valent vaccine (PNEUMOVAX 23) 25 MCG/0.5ML injection Inject 0.52ml IM once  . [DISCONTINUED] venlafaxine XR (EFFEXOR-XR) 75 MG 24 hr capsule TAKE 2 CAPSULES BY MOUTH DAILY. (Patient taking differently: Take 150 mg by mouth daily. )   No facility-administered encounter medications on file as of  04/07/2020.    Surgical History: Past Surgical History:  Procedure Laterality Date  . CARDIAC CATHETERIZATION  ~ 2007  . EXCISIONAL HEMORRHOIDECTOMY  1960's  . EYE SURGERY Bilateral    Cataract extraction with IOL  . JOINT REPLACEMENT Left 2009  . KNEE ARTHROSCOPY Left X 2 <2008  . KNEE ARTHROSCOPY WITH SUBCHONDROPLASTY Right 04/19/2016   Procedure: KNEE ARTHROSCOPY WITH SUBCHONDROPLASTY, PARTIAL MENISCECTOMY;  Surgeon: Hessie Knows, MD;  Location: [facility] ORS;  Service: Orthopedics;  Laterality: Right;  . LOOP RECORDER INSERTION N/A 01/02/2018   Procedure: LOOP RECORDER INSERTION;  Surgeon: Deboraha Sprang, MD;  Location: [facility];  Service: Cardiovascular;  Laterality: N/A;  . SHOULDER OPEN ROTATOR CUFF REPAIR Right 2001  . TONSILLECTOMY  ~ 1950  . TOTAL KNEE ARTHROPLASTY Left 2008  . TOTAL KNEE ARTHROPLASTY Right 07/03/2016   Procedure: TOTAL KNEE ARTHROPLASTY;  Surgeon: Hessie Knows, MD;  Location: [facility] ORS;  Service: Orthopedics;  Laterality: Right;  . TUBAL LIGATION  1973  . VAGINAL HYSTERECTOMY  1974    Medical History: Past Medical History:  Diagnosis Date  . Anxiety   . Arthritis    "shoulders" (08/20/2014)  . Asthma   . Chronic lower back pain   . Complication of anesthesia    "agitated & restless after knee replacement in 2008"  . COPD (chronic obstructive pulmonary disease) ([place])   . DDD (degenerative disc disease), lumbar   . Depression   . Diverticulosis   . GERD (gastroesophageal reflux disease)   . High cholesterol   . Hypertension   . Hypothyroidism   . On home oxygen therapy    "2L at night" (08/20/2014)  . Pre-diabetes   . Shortness of breath dyspnea    with exertion  . Stroke [facility])     Family History: Family History  Problem Relation Age of Onset  . Diabetes Mother   . Hypertension Mother   . Lung cancer Mother   . Congestive Heart Failure Father   . Congestive Heart Failure Sister       Review of Systems  Constitutional: Positive  for fatigue. Negative for appetite change, chills and unexpected weight change.       Improved activity levels  HENT: Negative for congestion, postnasal drip, rhinorrhea, sneezing and sore throat.   Respiratory: Negative for cough, chest tightness, shortness of breath and wheezing.   Cardiovascular: Negative for chest pain and palpitations.       Blood pressure stable.   Gastrointestinal: Negative for abdominal pain, constipation, diarrhea, nausea and vomiting.  Endocrine: Negative for cold intolerance, heat intolerance, polydipsia and polyuria.  Genitourinary: Negative for dysuria, frequency and urgency.  Musculoskeletal: Positive for back pain and myalgias. Negative for arthralgias, joint swelling and neck pain.  Skin: Negative for rash.  Neurological: Negative for dizziness, tremors, numbness and headaches.  Hematological: Negative for adenopathy. Does not bruise/bleed easily.  Psychiatric/Behavioral: Negative for behavioral problems (Depression), sleep disturbance and suicidal ideas. The patient is nervous/anxious.      Today's Vitals   04/07/20 1523  BP:  130/86  Pulse: 69  Resp: 16  Temp: (!) 97.1 F (36.2 C)  SpO2: 95%  Weight: 210 lb 9.6 oz (95.5 kg)  Height: 5\' 6"  (1.676 m)   Body mass index is 33.99 kg/m.   Physical Exam Vitals and nursing note reviewed.  Constitutional:      General: She is not in acute distress.    Appearance: Normal appearance. She is well-developed. She is not diaphoretic.  HENT:     Head: Normocephalic and atraumatic.     Nose: Nose normal.     Mouth/Throat:     Pharynx: No oropharyngeal exudate.  Eyes:     Pupils: Pupils are equal, round, and reactive to light.  Neck:     Thyroid: No thyromegaly.     Vascular: No JVD.     Trachea: No tracheal deviation.  Cardiovascular:     Rate and Rhythm: Normal rate and regular rhythm.     Pulses: Normal pulses.     Heart sounds: Normal heart sounds. No murmur heard.  No friction rub. No gallop.    Pulmonary:     Effort: Pulmonary effort is normal. No respiratory distress.     Breath sounds: Normal breath sounds. No wheezing or rales.  Chest:     Chest wall: No tenderness.  Abdominal:     General: Bowel sounds are normal.     Palpations: Abdomen is soft.     Tenderness: There is no abdominal tenderness.  Musculoskeletal:        General: Normal range of motion.     Cervical back: Normal range of motion and neck supple.     Comments: Improved lower back pain. Patient has seen orthopedics.  Lymphadenopathy:     Cervical: No cervical adenopathy.  Skin:    General: Skin is warm and dry.  Neurological:     General: No focal deficit present.     Mental Status: She is alert and oriented to person, place, and time.     Cranial Nerves: No cranial nerve deficit.  Psychiatric:        Mood and Affect: Mood normal.        Behavior: Behavior normal.        Thought Content: Thought content normal.        Judgment: Judgment normal.    Depression screen [facility] 2/9 04/07/2020 09/18/2019 03/31/2019 04/29/2018 02/12/2018  Decreased Interest 0 0 0 0 0  Down, Depressed, Hopeless 0 0 0 0 0  PHQ - 2 Score 0 0 0 0 0    Functional Status Survey: Is the patient deaf or have difficulty hearing?: No Does the patient have difficulty seeing, even when wearing glasses/contacts?: No Does the patient have difficulty concentrating, remembering, or making decisions?: No Does the patient have difficulty walking or climbing stairs?: Yes Does the patient have difficulty dressing or bathing?: No Does the patient have difficulty doing errands alone such as visiting a doctor's office or shopping?: Yes  MMSE - [place] Exam 04/07/2020 03/31/2019  Orientation to time 5 5  Orientation to Place 5 5  Registration 0 3  Attention/ Calculation 5 5  Recall 3 3  Language- name 2 objects 2 2  Language- repeat 1 1  Language- follow 3 step command 3 3  Language- read & follow direction 1 1  Write a sentence - 1   Copy design 1 1  Total score - 30    Fall Risk  04/07/2020 09/18/2019 03/31/2019 04/29/2018 02/12/2018  Falls in  the past year? 1 0 0 No No  Comment - - - - -  Number falls in past yr: 1 - - - -  Comment - - - - -  Injury with Fall? 0 - - - -     LABS: Recent Results (from the past 2160 hour(s))  CUP PACEART REMOTE DEVICE CHECK     Status: None   Collection Time: 02/24/20 11:32 PM  Result Value Ref Range   Date Time Interrogation Session [id number]    Pulse Generator Manufacturer MERM    Pulse Gen Model [id number] Reveal LINQ    Pulse Gen Serial Number [id number] [facility] Pulse Generator Type ICM/ILR    Implantable Pulse Generator Implant Date [id number]    Eval Rhythm SR at 62 bpm   POCT Urinalysis Dipstick     Status: Abnormal   Collection Time: 03/15/20  2:16 PM  Result Value Ref Range   Color, UA     Clarity, UA     Glucose, UA Negative Negative   Bilirubin, UA Moderate    Ketones, UA Moderate    Spec Grav, UA 1.025 1.010 - 1.025   Blood, UA Small    pH, UA 5.0 5.0 - 8.0   Protein, UA Positive (A) Negative    Comment: 100+   Urobilinogen, UA 0.2 0.2 or 1.0 E.U./dL   Nitrite, UA Negative    Leukocytes, UA Trace (A) Negative   Appearance     Odor    CUP PACEART REMOTE DEVICE CHECK     Status: None   Collection Time: 03/28/20 11:32 PM  Result Value Ref Range   Date Time Interrogation Session [id number]    Pulse Generator Manufacturer MERM    Pulse Gen Model [id number] Reveal LINQ    Pulse Gen Serial Number [id number]    Clinic Name [facility]    Implantable Pulse Generator Type ICM/ILR    Implantable Pulse Generator Implant Date [id number]   UA/M w/rflx Culture, Routine     Status: None   Collection Time: 04/07/20  4:39 PM   Specimen: Urine   Urine  Result Value Ref Range   Specific Gravity, UA CANCELED     Comment: Test not performed. Insufficient specimen to perform or complete analysis.  Result canceled by the  ancillary.    pH, UA CANCELED     Comment: Test not performed  Result canceled by the ancillary.    Protein,UA CANCELED     Comment: Test not performed  Result canceled by the ancillary.    Glucose, UA CANCELED     Comment: Test not performed  Result canceled by the ancillary.    Ketones, UA CANCELED     Comment: Test not performed  Result canceled by the ancillary.     .Assessment/Plan: 1. Encounter for general adult medical examination with abnormal findings Annual health maintenance exam.   2. Essential hypertension Stable. Continue bp medication as prescribed   3. Intractable episodic cluster headache Improved. Will continue to monitor.   4. History of CVA (cerebrovascular accident) Stable cerebrovascular disease. Continue regular visits with neurology/cardiology as scheduled.   5. Hyperlipidemia, unspecified hyperlipidemia type conitnue atorvastatin as prescribed   6. Need for vaccination against Streptococcus pneumoniae using pneumococcal conjugate vaccine 7 Prescription for prevnar 13 sent to her pharmacy for administration.  - pneumococcal 13-valent conjugate vaccine (PREVNAR 13) SUSP injection; Inject 0.5 mLs into the muscle once for 1 dose.  Dispense: 0.5 mL;  Refill: 0  7. Dysuria - UA/M w/rflx Culture, Routine  General Counseling: Sandy Salaam understanding of the findings of todays visit and agrees with plan of treatment. I have discussed any further diagnostic evaluation that may be needed or ordered today. We also reviewed her medications today. she has been encouraged to call the office with any questions or concerns that should arise related to todays visit.    Counseling:  Hypertension Counseling:   The following hypertensive lifestyle modification were recommended and discussed:  1. Limiting alcohol intake to less than 1 oz/day of ethanol:(24 oz of beer or 8 oz of wine or 2 oz of 100-proof whiskey). 2. Take baby ASA 81 mg daily. 3.  Importance of regular aerobic exercise and losing weight. 4. Reduce dietary saturated fat and cholesterol intake for overall cardiovascular health. 5. Maintaining adequate dietary potassium, calcium, and magnesium intake. 6. Regular monitoring of the blood pressure. 7. Reduce sodium intake to less than 100 mmol/day (less than 2.3 gm of sodium or less than 6 gm of sodium choride)   This patient was seen by [place] with Dr Lavera Guise as a part of collaborative care agreement  Orders Placed This Encounter  Procedures  . UA/M w/rflx Culture, Routine    Meds ordered this encounter  Medications  . pneumococcal 13-valent conjugate vaccine (PREVNAR 13) SUSP injection    Sig: Inject 0.5 mLs into the muscle once for 1 dose.    Dispense:  0.5 mL    Refill:  0    Order Specific Question:   Supervising Provider    Answer:   Lavera Guise [[id number]]    Total time spent: 62 Minutes  Time spent includes review of chart, medications, test results, and follow up plan with the patient.     Lavera Guise, MD  Internal Medicine

## 2020-04-10 DIAGNOSIS — M62838 Other muscle spasm: Secondary | ICD-10-CM | POA: Insufficient documentation

## 2020-04-10 DIAGNOSIS — N390 Urinary tract infection, site not specified: Secondary | ICD-10-CM | POA: Insufficient documentation

## 2020-04-10 DIAGNOSIS — R319 Hematuria, unspecified: Secondary | ICD-10-CM | POA: Insufficient documentation

## 2020-04-11 LAB — UA/M W/RFLX CULTURE, ROUTINE

## 2020-04-20 ENCOUNTER — Ambulatory Visit
Admission: RE | Admit: 2020-04-20 | Discharge: 2020-04-20 | Disposition: A | Discharge status: Home / Self Care | Payer: Medicare Other | Source: Ambulatory Visit | Attending: Nurse Practitioner | Admitting: Nurse Practitioner

## 2020-04-20 ENCOUNTER — Other Ambulatory Visit: Payer: Self-pay

## 2020-04-20 DIAGNOSIS — G8929 Other chronic pain: Secondary | ICD-10-CM | POA: Diagnosis not present

## 2020-04-20 DIAGNOSIS — M5442 Lumbago with sciatica, left side: Secondary | ICD-10-CM | POA: Insufficient documentation

## 2020-04-20 DIAGNOSIS — M5441 Lumbago with sciatica, right side: Secondary | ICD-10-CM | POA: Diagnosis not present

## 2020-04-20 DIAGNOSIS — M545 Low back pain: Secondary | ICD-10-CM | POA: Diagnosis not present

## 2020-04-21 ENCOUNTER — Telehealth: Payer: Self-pay

## 2020-04-21 NOTE — Telephone Encounter (Signed)
Frequent tachy alerts received for ILR implanted for CVA. Appear SVT, similar to previous asymptomatic episodes. On metropolol. Could we adjust programming to minimize alerts?

## 2020-04-25 NOTE — Telephone Encounter (Signed)
Leigh  lets do two things 1) lets drop detection to 130 And after we have an event with onset 2) lets increase the rate to 180 Thanks SK

## 2020-04-27 NOTE — Telephone Encounter (Signed)
LMOM of [name]( daughter) to call device clinic. DC # and office hours provided. Need to schedule patient for DC appointment for reprogramming per Dr Caryl Comes.

## 2020-04-27 NOTE — Telephone Encounter (Signed)
Called patient to discuss coming into device clinic for reprogramming. Patient does not drive and will have to come when daughter is able to bring her. Patient requested to call her back this afternoon to discuss apt. Will follow-up this afternoon.

## 2020-04-28 ENCOUNTER — Other Ambulatory Visit: Payer: Self-pay

## 2020-04-28 ENCOUNTER — Encounter (INDEPENDENT_AMBULATORY_CARE_PROVIDER_SITE_OTHER): Payer: Medicare Other

## 2020-04-28 ENCOUNTER — Ambulatory Visit (INDEPENDENT_AMBULATORY_CARE_PROVIDER_SITE_OTHER): Payer: Medicare Other | Admitting: Vascular Surgery

## 2020-04-28 DIAGNOSIS — I10 Essential (primary) hypertension: Secondary | ICD-10-CM

## 2020-04-28 MED ORDER — LOSARTAN POTASSIUM 25 MG PO TABS: 25.0000 mg | ORAL_TABLET | Freq: Every day | ORAL | 2 refills | Status: DC

## 2020-04-28 MED ORDER — DONEPEZIL HCL 5 MG PO TABS: 5.0000 mg | ORAL_TABLET | Freq: Every day | ORAL | 1 refills | Status: DC

## 2020-04-29 ENCOUNTER — Telehealth: Payer: Self-pay

## 2020-04-29 NOTE — Telephone Encounter (Signed)
Authorization approved for CYCLOBENZAPRINE HCL tablet from 01/30/2020 through 07/28/2020 [facility]

## 2020-05-01 ENCOUNTER — Ambulatory Visit: Payer: Medicare Other

## 2020-05-02 LAB — CUP PACEART REMOTE DEVICE CHECK
Date Time Interrogation Session: 20210911233622
Implantable Pulse Generator Implant Date: 20190516

## 2020-05-05 DIAGNOSIS — M5441 Lumbago with sciatica, right side: Secondary | ICD-10-CM | POA: Diagnosis not present

## 2020-05-09 ENCOUNTER — Encounter (INDEPENDENT_AMBULATORY_CARE_PROVIDER_SITE_OTHER): Payer: Self-pay | Admitting: Vascular Surgery

## 2020-05-09 ENCOUNTER — Other Ambulatory Visit: Payer: Self-pay

## 2020-05-09 ENCOUNTER — Ambulatory Visit (INDEPENDENT_AMBULATORY_CARE_PROVIDER_SITE_OTHER): Payer: Medicare Other

## 2020-05-09 ENCOUNTER — Ambulatory Visit (INDEPENDENT_AMBULATORY_CARE_PROVIDER_SITE_OTHER): Payer: Medicare Other | Admitting: Vascular Surgery

## 2020-05-09 VITALS — BP 152/69 | HR 80 | Resp 16 | Wt 209.4 lb

## 2020-05-09 DIAGNOSIS — I639 Cerebral infarction, unspecified: Secondary | ICD-10-CM | POA: Diagnosis not present

## 2020-05-09 DIAGNOSIS — I6523 Occlusion and stenosis of bilateral carotid arteries: Secondary | ICD-10-CM | POA: Diagnosis not present

## 2020-05-09 DIAGNOSIS — G459 Transient cerebral ischemic attack, unspecified: Secondary | ICD-10-CM | POA: Diagnosis not present

## 2020-05-09 DIAGNOSIS — I10 Essential (primary) hypertension: Secondary | ICD-10-CM

## 2020-05-09 DIAGNOSIS — E785 Hyperlipidemia, unspecified: Secondary | ICD-10-CM | POA: Diagnosis not present

## 2020-05-09 DIAGNOSIS — J449 Chronic obstructive pulmonary disease, unspecified: Secondary | ICD-10-CM

## 2020-05-09 NOTE — Telephone Encounter (Signed)
Spoke with [name] Douglas (DPR).  Advised of LINQ reprogramming orders received from Dr. Caryl Comes.  [name] Douglas is agreeable to scheduling appointment, but it will need to be on a Monday or a Friday.  Advised will forward message to scheduler for assistance.  [name] Douglas is in agreement with plan and denies additional questions at this time.

## 2020-05-11 ENCOUNTER — Encounter (INDEPENDENT_AMBULATORY_CARE_PROVIDER_SITE_OTHER): Payer: Self-pay | Admitting: Vascular Surgery

## 2020-05-11 NOTE — Progress Notes (Signed)
MRN : [id number]  [name] Douglas is a 79 y.o. (03-15-1941) female who presents with chief complaint of  Chief Complaint  Patient presents with  . Follow-up    ultrasound follow up  .  History of Present Illness:   The patient is seen for follow up evaluation of carotid stenosis. The carotid stenosis followed by ultrasound.   The patient denies amaurosis fugax. There is no recent history of TIA symptoms or focal motor deficits. There is no prior documented CVA.  The patient is taking enteric-coated aspirin 81 mg daily.  There is no history of migraine headaches. There is no history of seizures.  The patient has a history of coronary artery disease, no recent episodes of angina or shortness of breath. The patient denies PAD or claudication symptoms. There is a history of hyperlipidemia which is being treated with a statin.    Carotid Duplex done today shows <40% bilateral ICA stenosis.  No change compared to last study in May, 2019 (there is bilateral high grade external carotid stenosis again noted)  Current Meds  Medication Sig  . atorvastatin (LIPITOR) 80 MG tablet Take 1 tablet (80 mg total) by mouth daily at 6 PM.  . calcium carbonate (OS-CAL) 1250 (500 Ca) MG chewable tablet Chew 1 tablet by mouth daily.   . clopidogrel (PLAVIX) 75 MG tablet Take 1 tablet (75 mg total) by mouth daily.  . cyclobenzaprine (FLEXERIL) 5 MG tablet Take 1 tablet (5 mg total) by mouth at bedtime.  . diphenhydrAMINE HCl, Sleep, (SLEEP AID) 25 MG CAPS Take 25 mg by mouth at bedtime as needed (sleep or allergies).   . docusate sodium (COLACE) 100 MG capsule Take 100 mg by mouth at bedtime.  . donepezil (ARICEPT) 5 MG tablet Take 1 tablet (5 mg total) by mouth at bedtime.  . ergocalciferol (DRISDOL) 1.25 MG (50000 UT) capsule Take 1 capsule (50,000 Units total) by mouth once a week.  . losartan (COZAAR) 25 MG tablet Take 1 tablet (25 mg total) by mouth daily.  . metoprolol succinate (TOPROL-XL) 50 MG  24 hr tablet Take 2 tablets (100 mg total) by mouth daily.  . nitrofurantoin, macrocrystal-monohydrate, (MACROBID) 100 MG capsule Take 1 capsule (100 mg total) by mouth 2 (two) times daily.  . traMADol (ULTRAM) 50 MG tablet Take by mouth every 6 (six) hours as needed.     Past Medical History:  Diagnosis Date  . Anxiety   . Arthritis    "shoulders" (08/20/2014)  . Asthma   . Chronic lower back pain   . Complication of anesthesia    "agitated & restless after knee replacement in 2008"  . COPD (chronic obstructive pulmonary disease) ([place])   . DDD (degenerative disc disease), lumbar   . Depression   . Diverticulosis   . GERD (gastroesophageal reflux disease)   . High cholesterol   . Hypertension   . Hypothyroidism   . On home oxygen therapy    "2L at night" (08/20/2014)  . Pre-diabetes   . Shortness of breath dyspnea    with exertion  . Stroke [facility])     Past Surgical History:  Procedure Laterality Date  . CARDIAC CATHETERIZATION  ~ 2007  . EXCISIONAL HEMORRHOIDECTOMY  1960's  . EYE SURGERY Bilateral    Cataract extraction with IOL  . JOINT REPLACEMENT Left 2009  . KNEE ARTHROSCOPY Left X 2 <2008  . KNEE ARTHROSCOPY WITH SUBCHONDROPLASTY Right 04/19/2016   Procedure: KNEE ARTHROSCOPY WITH SUBCHONDROPLASTY, PARTIAL MENISCECTOMY;  Surgeon: Hessie Knows, MD;  Location: [facility] ORS;  Service: Orthopedics;  Laterality: Right;  . LOOP RECORDER INSERTION N/A 01/02/2018   Procedure: LOOP RECORDER INSERTION;  Surgeon: Deboraha Sprang, MD;  Location: [facility];  Service: Cardiovascular;  Laterality: N/A;  . SHOULDER OPEN ROTATOR CUFF REPAIR Right 2001  . TONSILLECTOMY  ~ 1950  . TOTAL KNEE ARTHROPLASTY Left 2008  . TOTAL KNEE ARTHROPLASTY Right 07/03/2016   Procedure: TOTAL KNEE ARTHROPLASTY;  Surgeon: Hessie Knows, MD;  Location: [facility] ORS;  Service: Orthopedics;  Laterality: Right;  . TUBAL LIGATION  1973  . VAGINAL HYSTERECTOMY  1974    Social History Social History    Tobacco Use  . Smoking status: Current Some Day Smoker    Packs/day: 0.25    Years: 53.00    Pack years: 13.25    Types: Cigarettes  . Smokeless tobacco: Never Used  . Tobacco comment: 3 cigarettes in week  Vaping Use  . Vaping Use: Never used  Substance Use Topics  . Alcohol use: No  . Drug use: No    Family History Family History  Problem Relation Age of Onset  . Diabetes Mother   . Hypertension Mother   . Lung cancer Mother   . Congestive Heart Failure Father   . Congestive Heart Failure Sister     Allergies  Allergen Reactions  . Heparin Nausea And Vomiting  . Other     Other reaction(s): Vomiting     REVIEW OF SYSTEMS (Negative unless checked)  Constitutional: [] Weight loss  [] Fever  [] Chills Cardiac: [] Chest pain   [] Chest pressure   [] Palpitations   [] Shortness of breath when laying flat   [] Shortness of breath with exertion. Vascular:  [] Pain in legs with walking   [] Pain in legs at rest  [] History of DVT   [] Phlebitis   [] Swelling in legs   [] Varicose veins   [] Non-healing ulcers Pulmonary:   [] Uses home oxygen   [] Productive cough   [] Hemoptysis   [] Wheeze  [] COPD   [] Asthma Neurologic:  [] Dizziness   [] Seizures   [] History of stroke   [x] History of TIA  [] Aphasia   [] Vissual changes   [] Weakness or numbness in arm   [] Weakness or numbness in leg Musculoskeletal:   [] Joint swelling   [x] Joint pain   [] Low back pain Hematologic:  [] Easy bruising  [] Easy bleeding   [] Hypercoagulable state   [] Anemic Gastrointestinal:  [] Diarrhea   [] Vomiting  [] Gastroesophageal reflux/heartburn   [] Difficulty swallowing. Genitourinary:  [] Chronic kidney disease   [] Difficult urination  [] Frequent urination   [] Blood in urine Skin:  [] Rashes   [] Ulcers  Psychological:  [] History of anxiety   []  History of major depression.  Physical Examination  Vitals:   05/09/20 1613  BP: (!) 152/69  Pulse: 80  Resp: 16  Weight: 209 lb 6.4 oz (95 kg)   Body mass index is 33.8  kg/m. Gen: WD/WN, NAD Head: [place]/AT, No temporalis wasting.  Ear/Nose/Throat: Hearing grossly intact, nares w/o erythema or drainage Eyes: PER, EOMI, sclera nonicteric.  Neck: Supple, no large masses.   Pulmonary:  Good air movement, no audible wheezing bilaterally, no use of accessory muscles.  Cardiac: RRR, no JVD Vascular: bilateral carotid bruits Vessel Right Left  Radial Palpable Palpable  Carotid Palpable Palpable  Gastrointestinal: Non-distended. No guarding/no peritoneal signs.  Musculoskeletal: M/S 5/5 throughout.  No deformity or atrophy.  Neurologic: CN 2-12 intact. Symmetrical.  Speech is fluent. Motor exam as listed above. Psychiatric: Judgment intact, Mood &  affect appropriate for pt's clinical situation. Dermatologic: No rashes or ulcers noted.  No changes consistent with cellulitis.   CBC Lab Results  Component Value Date   WBC 8.6 11/06/2019   HGB 12.5 11/06/2019   HCT 38.8 11/06/2019   MCV 88.6 11/06/2019   PLT 263 11/06/2019    BMET    Component Value Date/Time   NA 142 11/06/2019 1218   NA 142 05/07/2019 1409   NA 143 08/04/2014 0356   K 3.5 11/06/2019 1218   K 4.0 08/07/2014 1123   CL 103 11/06/2019 1218   CL 110 (H) 08/04/2014 0356   CO2 28 11/06/2019 1218   CO2 28 08/04/2014 0356   GLUCOSE 100 (H) 11/06/2019 1218   GLUCOSE 99 08/04/2014 0356   BUN 14 11/06/2019 1218   BUN 15 05/07/2019 1409   BUN 3 (L) 08/04/2014 0356   CREATININE 0.73 11/06/2019 1218   CREATININE 0.69 08/08/2014 0358   CALCIUM 9.4 11/06/2019 1218   CALCIUM 7.7 (L) 08/04/2014 0356   GFRNONAA >60 11/06/2019 1218   GFRNONAA >60 08/08/2014 0358   GFRNONAA >60 08/23/2013 1439   GFRAA >60 11/06/2019 1218   GFRAA >60 08/08/2014 0358   GFRAA >60 08/23/2013 1439   CrCl cannot be calculated (Patient's most recent lab result is older than the maximum 21 days allowed.).  COAG Lab Results  Component Value Date   INR 1.0 11/06/2019   INR 0.97 12/20/2017   INR 0.94 12/08/2016     Radiology MR LUMBAR SPINE WO CONTRAST  Result Date: 04/20/2020 CLINICAL DATA:  Low back pain extending into the buttocks bilaterally. Pain extends into the thighs with burning sensation in both thighs. EXAM: MRI LUMBAR SPINE WITHOUT CONTRAST TECHNIQUE: Multiplanar, multisequence MR imaging of the lumbar spine was performed. No intravenous contrast was administered. COMPARISON:  Lumbar spine radiographs 03/15/2020 FINDINGS: Segmentation: 5 non rib-bearing lumbar type vertebral bodies are present. The lowest fully formed vertebral body is L5. Alignment: Slight anterolisthesis is present at L3-4 and L4-5. Minimal retrolisthesis present at L2-3. Vertebrae: A remote superior endplate fractures present at L1. Chronic fatty endplate marrow changes are present at L4-5 bilaterally. Marrow signal and vertebral body heights are otherwise normal. Conus medullaris and cauda equina: Conus extends to the L1 level. Conus and cauda equina appear normal. Paraspinal and other soft tissues: Limited imaging the abdomen is unremarkable. There is no significant adenopathy. No solid organ lesions are present. Disc levels: T12-L1: A mild broad-based disc protrusion is present. Facet hypertrophy is noted bilaterally. No significant stenosis present. L1-2: A mild broad-based disc protrusion is present. Facet hypertrophy is worse on the right. No significant stenosis is present. L2-3: Extruded disc material extends 11 mm superiorly in the right lateral recess. Broad-based disc protrusion is present. Moderate facet hypertrophy and ligamentum flavum thickening is noted. This results in moderate central canal stenosis with crowding of the nerve roots. Moderate foraminal narrowing is worse on the right. L3-4: A broad-based disc protrusion is present. Moderate facet hypertrophy and ligamentum flavum thickening contributes to severe central canal stenosis with crowding of nerve roots above this level. Moderate foraminal narrowing is present  bilaterally. L4-5: A broad-based disc protrusion is present. Moderate facet hypertrophy is worse on left. This results in severe central canal stenosis. Severe left and moderate right foraminal narrowing is present. L5-S1: A mild broad-based disc bulge is present. Moderate facet hypertrophy present on the right. This leads to mild right foraminal stenosis. IMPRESSION: 1. Moderate central and bilateral foraminal stenosis at L2-3  is worse on the right. A broad-based disc protrusion present with the superior disc extrusion extending 11 mm above the inferior endplate of L2 in the right lateral recess. 2. Severe central canal stenosis at L3-4 and L4-5 with crowding of the nerve roots above both levels. 3. Moderate foraminal narrowing bilaterally at L3-4. 4. Severe left and moderate right foraminal stenosis at L4-5. 5. Mild right foraminal narrowing at L5-S1. 6. Remote superior endplate compression fracture at L1. Electronically Signed   By: San Morelle M.D.   On: 04/20/2020 22:27   CUP PACEART REMOTE DEVICE CHECK  Result Date: 05/02/2020 ILR summary report received. Battery status OK. Normal device function. No new symptom episodes, 8 tachy evens, available EGM's appears SVT 160-170's. No brady, or pause episodes. No new AF episodes. Monthly summary reports and ROV/PRN JM  VAS US CAROTID  Result Date: 05/09/2020 Carotid Arterial Duplex Study Indications:       Carotid artery disease. Comparison Study:  09/25/2019 ( 1-39% stenosis in ICA's) Performing Technologist: Almira Coaster RVS  Examination Guidelines: A complete evaluation includes B-mode imaging, spectral Doppler, color Doppler, and power Doppler as needed of all accessible portions of each vessel. Bilateral testing is considered an integral part of a complete examination. Limited examinations for reoccurring indications may be performed as noted.  Right Carotid Findings: +----------+--------+--------+--------+------------------+--------+            PSV cm/sEDV cm/sStenosisPlaque DescriptionComments +----------+--------+--------+--------+------------------+--------+ CCA Prox  91      0                                          +----------+--------+--------+--------+------------------+--------+ CCA Mid   90      0                                          +----------+--------+--------+--------+------------------+--------+ CCA Distal86      0                                          +----------+--------+--------+--------+------------------+--------+ ICA Prox  86      4                                          +----------+--------+--------+--------+------------------+--------+ ICA Mid   78      5                                          +----------+--------+--------+--------+------------------+--------+ ICA Distal62      8                                          +----------+--------+--------+--------+------------------+--------+ ECA       298     0                                          +----------+--------+--------+--------+------------------+--------+ +----------+--------+-------+--------+-------------------+  PSV cm/sEDV cmsDescribeArm Pressure (mmHG) +----------+--------+-------+--------+-------------------+ Subclavian191     0                                  +----------+--------+-------+--------+-------------------+ +---------+--------+--+--------+-+ VertebralPSV cm/s65EDV cm/s0 +---------+--------+--+--------+-+  Left Carotid Findings: +----------+--------+--------+--------+------------------+--------+           PSV cm/sEDV cm/sStenosisPlaque DescriptionComments +----------+--------+--------+--------+------------------+--------+ CCA Prox  111     11                                         +----------+--------+--------+--------+------------------+--------+ CCA Mid   118     10                                          +----------+--------+--------+--------+------------------+--------+ CCA Distal82      7                                          +----------+--------+--------+--------+------------------+--------+ ICA Prox  105     12                                         +----------+--------+--------+--------+------------------+--------+ ICA Mid   90      13                                         +----------+--------+--------+--------+------------------+--------+ ICA Distal62      8                                          +----------+--------+--------+--------+------------------+--------+ ECA       246     0                                          +----------+--------+--------+--------+------------------+--------+ +----------+--------+--------+--------+-------------------+           PSV cm/sEDV cm/sDescribeArm Pressure (mmHG) +----------+--------+--------+--------+-------------------+ Subclavian190     0                                   +----------+--------+--------+--------+-------------------+ +---------+--------+--+--------+--+ VertebralPSV cm/s46EDV cm/s11 +---------+--------+--+--------+--+   Summary: Right Carotid: Velocities in the right ICA are consistent with a 1-39% stenosis.                The ECA appears >50% stenosed. Left Carotid: Velocities in the left ICA are consistent with a 1-39% stenosis.               The ECA appears >50% stenosed. Vertebrals:  Bilateral vertebral arteries demonstrate antegrade flow. Subclavians: Normal flow hemodynamics were seen in bilateral subclavian              arteries. *See table(s) above for measurements and observations.  Electronically signed by Hortencia Pilar MD on 05/09/2020 at 4:52:44 PM.    Final      Assessment/Plan 1. Occlusion and stenosis of bilateral carotid arteries Recommend:  Given the patient's asymptomatic subcritical stenosis no further invasive testing or surgery at this time.  Duplex ultrasound  shows <40% stenosis bilaterally.  Continue antiplatelet therapy as prescribed Continue management of CAD, HTN and Hyperlipidemia Healthy heart diet,  encouraged exercise at least 4 times per week Follow up in 12 months with duplex ultrasound and physical exam.   - VAS US CAROTID; Future  2. L brain TIA (transient ischemic attack) Continue antiplatelet therapy as ordered no changes  3. Essential hypertension Continue antihypertensive medications as already ordered, these medications have been reviewed and there are no changes at this time.   4. Chronic obstructive pulmonary disease, unspecified COPD type ([place]) Continue pulmonary medications and aerosols as already ordered, these medications have been reviewed and there are no changes at this time.  5. Hyperlipidemia, unspecified hyperlipidemia type Continue statin as ordered and reviewed, no changes at this time     Hortencia Pilar, MD  05/11/2020 8:22 AM

## 2020-05-16 ENCOUNTER — Other Ambulatory Visit: Payer: Self-pay

## 2020-05-16 DIAGNOSIS — E559 Vitamin D deficiency, unspecified: Secondary | ICD-10-CM

## 2020-05-16 MED ORDER — ATORVASTATIN CALCIUM 80 MG PO TABS
80.0000 mg | ORAL_TABLET | Freq: Every day | ORAL | 3 refills | Status: DC
Start: 2020-05-16 — End: 2020-08-22

## 2020-05-16 MED ORDER — ERGOCALCIFEROL 1.25 MG (50000 UT) PO CAPS: 50000.0000 [IU] | ORAL_CAPSULE | ORAL | 1 refills | Status: DC

## 2020-05-18 NOTE — Telephone Encounter (Signed)
Patient has OV scheduled with A. Tillery, PA-C for reprogramming.

## 2020-05-27 DIAGNOSIS — M419 Scoliosis, unspecified: Secondary | ICD-10-CM | POA: Diagnosis not present

## 2020-05-27 DIAGNOSIS — M47816 Spondylosis without myelopathy or radiculopathy, lumbar region: Secondary | ICD-10-CM | POA: Diagnosis not present

## 2020-05-27 DIAGNOSIS — I700 Atherosclerosis of aorta: Secondary | ICD-10-CM | POA: Diagnosis not present

## 2020-05-27 DIAGNOSIS — S32010A Wedge compression fracture of first lumbar vertebra, initial encounter for closed fracture: Secondary | ICD-10-CM | POA: Diagnosis not present

## 2020-05-27 DIAGNOSIS — Z8781 Personal history of (healed) traumatic fracture: Secondary | ICD-10-CM | POA: Diagnosis not present

## 2020-05-31 DIAGNOSIS — M48062 Spinal stenosis, lumbar region with neurogenic claudication: Secondary | ICD-10-CM | POA: Insufficient documentation

## 2020-05-31 DIAGNOSIS — M5441 Lumbago with sciatica, right side: Secondary | ICD-10-CM | POA: Diagnosis not present

## 2020-05-31 DIAGNOSIS — G8929 Other chronic pain: Secondary | ICD-10-CM | POA: Diagnosis not present

## 2020-06-01 ENCOUNTER — Other Ambulatory Visit: Payer: Self-pay

## 2020-06-01 DIAGNOSIS — M62838 Other muscle spasm: Secondary | ICD-10-CM

## 2020-06-01 MED ORDER — CYCLOBENZAPRINE HCL 5 MG PO TABS: 5.0000 mg | ORAL_TABLET | Freq: Every day | ORAL | 1 refills | Status: DC

## 2020-06-02 DIAGNOSIS — R4701 Aphasia: Secondary | ICD-10-CM | POA: Diagnosis present

## 2020-06-02 DIAGNOSIS — Z03818 Encounter for observation for suspected exposure to other biological agents ruled out: Secondary | ICD-10-CM | POA: Diagnosis not present

## 2020-06-02 DIAGNOSIS — J019 Acute sinusitis, unspecified: Secondary | ICD-10-CM | POA: Diagnosis not present

## 2020-06-02 DIAGNOSIS — B9689 Other specified bacterial agents as the cause of diseases classified elsewhere: Secondary | ICD-10-CM | POA: Diagnosis not present

## 2020-06-03 ENCOUNTER — Ambulatory Visit (INDEPENDENT_AMBULATORY_CARE_PROVIDER_SITE_OTHER): Payer: Medicare Other

## 2020-06-03 ENCOUNTER — Encounter: Payer: Medicare Other | Admitting: Student

## 2020-06-03 DIAGNOSIS — I6310 Cerebral infarction due to embolism of unspecified precerebral artery: Secondary | ICD-10-CM | POA: Diagnosis not present

## 2020-06-04 LAB — CUP PACEART REMOTE DEVICE CHECK
Date Time Interrogation Session: 20211014233958
Implantable Pulse Generator Implant Date: 20190516

## 2020-06-08 NOTE — Progress Notes (Signed)
Carelink Summary Report / Loop Recorder 

## 2020-06-09 DIAGNOSIS — G8929 Other chronic pain: Secondary | ICD-10-CM | POA: Diagnosis not present

## 2020-06-09 DIAGNOSIS — M48062 Spinal stenosis, lumbar region with neurogenic claudication: Secondary | ICD-10-CM | POA: Diagnosis not present

## 2020-06-09 DIAGNOSIS — M5441 Lumbago with sciatica, right side: Secondary | ICD-10-CM | POA: Diagnosis not present

## 2020-06-24 DIAGNOSIS — M5441 Lumbago with sciatica, right side: Secondary | ICD-10-CM | POA: Diagnosis not present

## 2020-06-24 DIAGNOSIS — M48062 Spinal stenosis, lumbar region with neurogenic claudication: Secondary | ICD-10-CM | POA: Diagnosis not present

## 2020-06-24 DIAGNOSIS — G8929 Other chronic pain: Secondary | ICD-10-CM | POA: Diagnosis not present

## 2020-06-27 ENCOUNTER — Other Ambulatory Visit: Payer: Self-pay

## 2020-06-27 DIAGNOSIS — I10 Essential (primary) hypertension: Secondary | ICD-10-CM

## 2020-06-27 MED ORDER — METOPROLOL SUCCINATE ER 50 MG PO TB24: 100.0000 mg | ORAL_TABLET | Freq: Every day | ORAL | 0 refills | Status: DC

## 2020-07-05 DIAGNOSIS — M5416 Radiculopathy, lumbar region: Secondary | ICD-10-CM | POA: Diagnosis not present

## 2020-07-05 DIAGNOSIS — M48061 Spinal stenosis, lumbar region without neurogenic claudication: Secondary | ICD-10-CM | POA: Diagnosis not present

## 2020-07-06 ENCOUNTER — Ambulatory Visit (INDEPENDENT_AMBULATORY_CARE_PROVIDER_SITE_OTHER): Payer: Medicare Other

## 2020-07-06 ENCOUNTER — Other Ambulatory Visit: Payer: Self-pay | Admitting: Neurosurgery

## 2020-07-06 DIAGNOSIS — I6310 Cerebral infarction due to embolism of unspecified precerebral artery: Secondary | ICD-10-CM | POA: Diagnosis not present

## 2020-07-06 LAB — CUP PACEART REMOTE DEVICE CHECK
Date Time Interrogation Session: 20211116225206
Implantable Pulse Generator Implant Date: 20190516

## 2020-07-08 NOTE — Progress Notes (Signed)
Carelink Summary Report / Loop Recorder 

## 2020-07-14 ENCOUNTER — Other Ambulatory Visit: Payer: Self-pay

## 2020-07-14 ENCOUNTER — Encounter: Payer: Self-pay | Admitting: Family Medicine

## 2020-07-14 ENCOUNTER — Emergency Department: Payer: Medicare Other

## 2020-07-14 ENCOUNTER — Observation Stay
Admission: EM | Admit: 2020-07-14 | Discharge: 2020-07-15 | Disposition: A | Discharge status: Home / Self Care | Payer: Medicare Other | Attending: Internal Medicine | Admitting: Internal Medicine

## 2020-07-14 DIAGNOSIS — J45909 Unspecified asthma, uncomplicated: Secondary | ICD-10-CM | POA: Diagnosis not present

## 2020-07-14 DIAGNOSIS — R42 Dizziness and giddiness: Secondary | ICD-10-CM

## 2020-07-14 DIAGNOSIS — I639 Cerebral infarction, unspecified: Principal | ICD-10-CM | POA: Diagnosis present

## 2020-07-14 DIAGNOSIS — M549 Dorsalgia, unspecified: Secondary | ICD-10-CM | POA: Diagnosis not present

## 2020-07-14 DIAGNOSIS — I10 Essential (primary) hypertension: Secondary | ICD-10-CM | POA: Diagnosis present

## 2020-07-14 DIAGNOSIS — Z20822 Contact with and (suspected) exposure to covid-19: Secondary | ICD-10-CM | POA: Diagnosis not present

## 2020-07-14 DIAGNOSIS — Z8673 Personal history of transient ischemic attack (TIA), and cerebral infarction without residual deficits: Secondary | ICD-10-CM | POA: Insufficient documentation

## 2020-07-14 DIAGNOSIS — M5442 Lumbago with sciatica, left side: Secondary | ICD-10-CM

## 2020-07-14 DIAGNOSIS — H538 Other visual disturbances: Secondary | ICD-10-CM | POA: Diagnosis not present

## 2020-07-14 DIAGNOSIS — G8929 Other chronic pain: Secondary | ICD-10-CM

## 2020-07-14 DIAGNOSIS — Z79899 Other long term (current) drug therapy: Secondary | ICD-10-CM | POA: Diagnosis not present

## 2020-07-14 DIAGNOSIS — E039 Hypothyroidism, unspecified: Secondary | ICD-10-CM | POA: Insufficient documentation

## 2020-07-14 DIAGNOSIS — E785 Hyperlipidemia, unspecified: Secondary | ICD-10-CM | POA: Diagnosis not present

## 2020-07-14 DIAGNOSIS — Z96653 Presence of artificial knee joint, bilateral: Secondary | ICD-10-CM | POA: Insufficient documentation

## 2020-07-14 DIAGNOSIS — R29818 Other symptoms and signs involving the nervous system: Secondary | ICD-10-CM | POA: Diagnosis not present

## 2020-07-14 DIAGNOSIS — Z23 Encounter for immunization: Secondary | ICD-10-CM | POA: Diagnosis not present

## 2020-07-14 DIAGNOSIS — M545 Low back pain, unspecified: Secondary | ICD-10-CM | POA: Insufficient documentation

## 2020-07-14 DIAGNOSIS — F17210 Nicotine dependence, cigarettes, uncomplicated: Secondary | ICD-10-CM | POA: Diagnosis not present

## 2020-07-14 LAB — PROTIME-INR
INR: 1 (ref 0.8–1.2)
Prothrombin Time: 12.4 seconds (ref 11.4–15.2)

## 2020-07-14 LAB — RESP PANEL BY RT-PCR (FLU A&B, COVID) ARPGX2
Influenza A by PCR: NEGATIVE
Influenza B by PCR: NEGATIVE
SARS Coronavirus 2 by RT PCR: NEGATIVE

## 2020-07-14 LAB — CBC
HCT: 37 % (ref 36.0–46.0)
Hemoglobin: 11.9 g/dL — ABNORMAL LOW (ref 12.0–15.0)
MCH: 30.5 pg (ref 26.0–34.0)
MCHC: 32.2 g/dL (ref 30.0–36.0)
MCV: 94.9 fL (ref 80.0–100.0)
Platelets: 286 10*3/uL (ref 150–400)
RBC: 3.9 MIL/uL (ref 3.87–5.11)
RDW: 13.3 % (ref 11.5–15.5)
WBC: 8.5 10*3/uL (ref 4.0–10.5)
nRBC: 0 % (ref 0.0–0.2)

## 2020-07-14 LAB — DIFFERENTIAL
Abs Immature Granulocytes: 0.05 10*3/uL (ref 0.00–0.07)
Basophils Absolute: 0.1 10*3/uL (ref 0.0–0.1)
Basophils Relative: 1 %
Eosinophils Absolute: 0.3 10*3/uL (ref 0.0–0.5)
Eosinophils Relative: 3 %
Immature Granulocytes: 1 %
Lymphocytes Relative: 18 %
Lymphs Abs: 1.6 10*3/uL (ref 0.7–4.0)
Monocytes Absolute: 0.6 10*3/uL (ref 0.1–1.0)
Monocytes Relative: 7 %
Neutro Abs: 6 10*3/uL (ref 1.7–7.7)
Neutrophils Relative %: 70 %

## 2020-07-14 LAB — APTT: aPTT: 40 seconds — ABNORMAL HIGH (ref 24–36)

## 2020-07-14 LAB — COMPREHENSIVE METABOLIC PANEL
ALT: 13 U/L (ref 0–44)
AST: 20 U/L (ref 15–41)
Albumin: 4.3 g/dL (ref 3.5–5.0)
Alkaline Phosphatase: 65 U/L (ref 38–126)
Anion gap: 10 (ref 5–15)
BUN: 16 mg/dL (ref 8–23)
CO2: 30 mmol/L (ref 22–32)
Calcium: 9.3 mg/dL (ref 8.9–10.3)
Chloride: 103 mmol/L (ref 98–111)
Creatinine, Ser: 0.89 mg/dL (ref 0.44–1.00)
GFR, Estimated: >60 mL/min (ref >60)
Glucose, Bld: 106 mg/dL — ABNORMAL HIGH (ref 70–99)
Potassium: 4 mmol/L (ref 3.5–5.1)
Sodium: 143 mmol/L (ref 135–145)
Total Bilirubin: 0.4 mg/dL (ref 0.3–1.2)
Total Protein: 7.4 g/dL (ref 6.5–8.1)

## 2020-07-14 MED ORDER — ACETAMINOPHEN 160 MG/5ML PO SOLN
650.0000 mg | ORAL | Status: DC | PRN
  Filled 2020-07-14: qty 20.3

## 2020-07-14 MED ORDER — ATORVASTATIN CALCIUM 20 MG PO TABS: 80.0000 mg | ORAL_TABLET | Freq: Every day | ORAL | Status: DC

## 2020-07-14 MED ORDER — CLOPIDOGREL BISULFATE 75 MG PO TABS
75.0000 mg | ORAL_TABLET | Freq: Every day | ORAL | Status: DC
  Administered 2020-07-15: 11:00:00 75 mg via ORAL
  Filled 2020-07-14: qty 1

## 2020-07-14 MED ORDER — ACETAMINOPHEN 325 MG PO TABS: 650.0000 mg | ORAL_TABLET | ORAL | Status: DC | PRN

## 2020-07-14 MED ORDER — HYDROCODONE-ACETAMINOPHEN 5-325 MG PO TABS
1.0000 | ORAL_TABLET | Freq: Two times a day (BID) | ORAL | Status: DC | PRN
  Administered 2020-07-15: 1 via ORAL
  Filled 2020-07-14: qty 1

## 2020-07-14 MED ORDER — DONEPEZIL HCL 5 MG PO TABS
5.0000 mg | ORAL_TABLET | Freq: Every day | ORAL | Status: DC
  Administered 2020-07-14: 5 mg via ORAL
  Filled 2020-07-14: qty 1

## 2020-07-14 MED ORDER — SODIUM CHLORIDE 0.9% FLUSH: 3.0000 mL | Freq: Once | INTRAVENOUS | Status: DC

## 2020-07-14 MED ORDER — CYCLOBENZAPRINE HCL 10 MG PO TABS
5.0000 mg | ORAL_TABLET | Freq: Every day | ORAL | Status: DC
  Administered 2020-07-14: 23:00:00 5 mg via ORAL
  Filled 2020-07-14: qty 1

## 2020-07-14 MED ORDER — STROKE: EARLY STAGES OF RECOVERY BOOK: Freq: Once | Status: AC

## 2020-07-14 MED ORDER — ACETAMINOPHEN 650 MG RE SUPP: 650.0000 mg | RECTAL | Status: DC | PRN

## 2020-07-14 MED ORDER — GABAPENTIN 100 MG PO CAPS
100.0000 mg | ORAL_CAPSULE | Freq: Three times a day (TID) | ORAL | Status: DC
  Administered 2020-07-14 – 2020-07-15 (×2): 100 mg via ORAL
  Filled 2020-07-14 (×2): qty 1

## 2020-07-14 NOTE — ED Notes (Signed)
Report given to Mac RN

## 2020-07-14 NOTE — ED Notes (Signed)
CODE STROKE called to [phone]

## 2020-07-14 NOTE — ED Notes (Signed)
CBG 113, Brianna, RN made aware.

## 2020-07-14 NOTE — ED Notes (Signed)
Tele neuro speaking to pt at this time

## 2020-07-14 NOTE — Plan of Care (Signed)
  Problem: Education: Goal: Knowledge of secondary prevention will improve Outcome: Progressing Goal: Knowledge of patient specific risk factors addressed and post discharge goals established will improve Outcome: Progressing Goal: Individualized Educational Video(s) Outcome: Progressing   Problem: Nutrition: Goal: Risk of aspiration will decrease Outcome: Progressing Goal: Dietary intake will improve Outcome: Progressing   Problem: Intracerebral Hemorrhage Tissue Perfusion: Goal: Complications of Intracerebral Hemorrhage will be minimized Outcome: Progressing   Problem: Ischemic Stroke/TIA Tissue Perfusion: Goal: Complications of ischemic stroke/TIA will be minimized Outcome: Progressing   Problem: Education: Goal: Knowledge of General Education information will improve Description: Including pain rating scale, medication(s)/side effects and non-pharmacologic comfort measures Outcome: Progressing   Problem: Health Behavior/Discharge Planning: Goal: Ability to manage health-related needs will improve Outcome: Progressing   Problem: Clinical Measurements: Goal: Ability to maintain clinical measurements within normal limits will improve Outcome: Progressing Goal: Will remain free from infection Outcome: Progressing Goal: Diagnostic test results will improve Outcome: Progressing Goal: Respiratory complications will improve Outcome: Progressing Goal: Cardiovascular complication will be avoided Outcome: Progressing   Problem: Activity: Goal: Risk for activity intolerance will decrease Outcome: Progressing   Problem: Nutrition: Goal: Adequate nutrition will be maintained Outcome: Progressing   Problem: Coping: Goal: Level of anxiety will decrease Outcome: Progressing   Problem: Elimination: Goal: Will not experience complications related to bowel motility Outcome: Progressing Goal: Will not experience complications related to urinary retention Outcome:  Progressing   Problem: Pain Managment: Goal: General experience of comfort will improve Outcome: Progressing   Problem: Safety: Goal: Ability to remain free from injury will improve Outcome: Progressing   Problem: Skin Integrity: Goal: Risk for impaired skin integrity will decrease Outcome: Progressing

## 2020-07-14 NOTE — ED Provider Notes (Signed)
[facility] Emergency Department Provider Note   ____________________________________________   First MD Initiated Contact with Patient 07/14/20 1732     (approximate)  I have reviewed the triage vital signs and the nursing notes.   HISTORY  Chief Complaint Weakness  Please note, slight delay in initial evaluation due to attending to a critical procedure for another patient in the ER  EM caveat: Limitation due to acuity, high concern for acute neurologic process  HPI [name] Douglas is a 79 y.o. female reports that about 4 or 4:30 PM today she was suddenly noticing a change or fuzziness in vision.   No recent illness felt fine right up until about 4 4:30 PM when symptoms started.  She and her daughter who is also he reports that she had same symptoms with a previous "TIA" a few years ago.  She has taken aspirin and Plavix daily, she took both of those already today  Mild nausea.  No headache.  No neck pain.  No chest pain.  No shortness of breath  Orts fuzziness in both eyes.  Slight feeling of nausea.  No specific weakness in 1 arm or leg.  No facial droop.  No speech changes.  Does have chronic difficulty with her right lower leg  Past Medical History:  Diagnosis Date  . Anxiety   . Arthritis    "shoulders" (08/20/2014)  . Asthma   . Chronic lower back pain   . Complication of anesthesia    "agitated & restless after knee replacement in 2008"  . COPD (chronic obstructive pulmonary disease) ([place])   . DDD (degenerative disc disease), lumbar   . Depression   . Diverticulosis   . GERD (gastroesophageal reflux disease)   . High cholesterol   . Hypertension   . Hypothyroidism   . On home oxygen therapy    "2L at night" (08/20/2014)  . Pre-diabetes   . Shortness of breath dyspnea    with exertion  . Stroke [facility])     Patient Active Problem List   Diagnosis Date Noted  . Muscle spasm 04/10/2020  . Urinary tract infection with hematuria  04/10/2020  . Headache disorder 12/21/2019  . Dizziness 11/06/2019  . Atopic dermatitis 10/28/2019  . History of CVA (cerebrovascular accident) 10/07/2019  . Intractable episodic cluster headache 09/20/2019  . Other symptoms and signs involving the nervous system 09/20/2019  . Iron deficiency anemia 05/17/2019  . Vitamin D deficiency 05/17/2019  . Flu vaccine need 05/17/2019  . Encounter for general adult medical examination with abnormal findings 04/05/2019  . Nausea and vomiting 04/05/2019  . Dysuria 04/05/2019  . Need for vaccination against Streptococcus pneumoniae using pneumococcal conjugate vaccine 7 04/05/2019  . Occlusion and stenosis of bilateral carotid arteries 11/24/2017  . Subarachnoid hemorrhage ([place]) 12/09/2016  . L brain TIA (transient ischemic attack) 08/24/2016  . Solitary pulmonary nodule 08/24/2016  . Aphasia   . Cellulitis of right knee 07/17/2016  . Primary localized osteoarthritis of right knee 07/03/2016  . Ileus ([place]) 04/24/2016  . Generalized abdominal pain   . Colitis due to Clostridium difficile   . Recurrent Clostridium difficile diarrhea   . Enteritis due to Clostridium difficile 08/22/2014  . Diarrhea 08/20/2014  . Essential hypertension 08/20/2014  . Hyperlipidemia 08/20/2014  . Hypothyroidism 08/20/2014  . Tobacco abuse 08/20/2014  . COPD (chronic obstructive pulmonary disease) ([place]) 08/20/2014  . Back pain 08/20/2014  . Acute diarrhea 07/27/2014    Past Surgical History:  Procedure Laterality Date  .  CARDIAC CATHETERIZATION  ~ 2007  . EXCISIONAL HEMORRHOIDECTOMY  1960's  . EYE SURGERY Bilateral    Cataract extraction with IOL  . JOINT REPLACEMENT Left 2009  . KNEE ARTHROSCOPY Left X 2 <2008  . KNEE ARTHROSCOPY WITH SUBCHONDROPLASTY Right 04/19/2016   Procedure: KNEE ARTHROSCOPY WITH SUBCHONDROPLASTY, PARTIAL MENISCECTOMY;  Surgeon: Hessie Knows, MD;  Location: [facility] ORS;  Service: Orthopedics;  Laterality: Right;  . LOOP RECORDER  INSERTION N/A 01/02/2018   Procedure: LOOP RECORDER INSERTION;  Surgeon: Deboraha Sprang, MD;  Location: [place] [facility];  Service: Cardiovascular;  Laterality: N/A;  . SHOULDER OPEN ROTATOR CUFF REPAIR Right 2001  . TONSILLECTOMY  ~ 1950  . TOTAL KNEE ARTHROPLASTY Left 2008  . TOTAL KNEE ARTHROPLASTY Right 07/03/2016   Procedure: TOTAL KNEE ARTHROPLASTY;  Surgeon: Hessie Knows, MD;  Location: [facility] ORS;  Service: Orthopedics;  Laterality: Right;  . TUBAL LIGATION  1973  . VAGINAL HYSTERECTOMY  1974    Prior to Admission medications   Medication Sig Start Date End Date Taking? Authorizing Provider  atorvastatin (LIPITOR) 80 MG tablet Take 1 tablet (80 mg total) by mouth daily at 6 PM. 05/16/20   Ronnell Freshwater, NP  calcium carbonate (OS-CAL) 1250 (500 Ca) MG chewable tablet Chew 1 tablet by mouth daily.     [provider]  clopidogrel (PLAVIX) 75 MG tablet Take 1 tablet (75 mg total) by mouth daily. 11/30/19   Ronnell Freshwater, NP  cyclobenzaprine (FLEXERIL) 5 MG tablet Take 1 tablet (5 mg total) by mouth at bedtime. 06/01/20   Ronnell Freshwater, NP  diphenhydrAMINE HCl, Sleep, (SLEEP AID) 25 MG CAPS Take 25 mg by mouth at bedtime as needed (sleep or allergies).     [provider]  docusate sodium (COLACE) 100 MG capsule Take 100 mg by mouth at bedtime.    [provider]  donepezil (ARICEPT) 5 MG tablet Take 1 tablet (5 mg total) by mouth at bedtime. 04/28/20   Ronnell Freshwater, NP  ergocalciferol (DRISDOL) 1.25 MG (50000 UT) capsule Take 1 capsule (50,000 Units total) by mouth once a week. 05/16/20   Ronnell Freshwater, NP  losartan (COZAAR) 25 MG tablet Take 1 tablet (25 mg total) by mouth daily. 04/28/20   Ronnell Freshwater, NP  metoprolol succinate (TOPROL-XL) 50 MG 24 hr tablet Take 2 tablets (100 mg total) by mouth daily. 06/27/20   Ronnell Freshwater, NP  nitrofurantoin, macrocrystal-monohydrate, (MACROBID) 100 MG capsule Take 1 capsule (100 mg total) by  mouth 2 (two) times daily. 04/01/20   Ronnell Freshwater, NP  predniSONE (DELTASONE) 10 MG tablet Use per dose pack Patient not taking: Reported on 04/07/2020 03/15/20   Ronnell Freshwater, NP  venlafaxine XR (EFFEXOR-XR) 75 MG 24 hr capsule TAKE 2 CAPSULES BY MOUTH DAILY. Patient not taking: Reported on 04/07/2020 12/30/19   Ronnell Freshwater, NP    Allergies Heparin and Other  Family History  Problem Relation Age of Onset  . Diabetes Mother   . Hypertension Mother   . Lung cancer Mother   . Congestive Heart Failure Father   . Congestive Heart Failure Sister     Social History Social History   Tobacco Use  . Smoking status: Current Some Day Smoker    Packs/day: 0.25    Years: 53.00    Pack years: 13.25    Types: Cigarettes  . Smokeless tobacco: Never Used  . Tobacco comment: 3 cigarettes in week  Vaping Use  .  Vaping Use: Never used  Substance Use Topics  . Alcohol use: No  . Drug use: No    Review of Systems Constitutional: No fever/chills Eyes: No loss of vision but it seems like a slight blurring vision ENT: No sore throat. Cardiovascular: Denies chest pain. Respiratory: Denies shortness of breath. Gastrointestinal: No abdominal pain.  Slight nausea Genitourinary: Negative for dysuria.  No abnormal odor. Musculoskeletal: Negative for back pain. Skin: Negative for rash. Neurological: Negative for headaches, areas of focal weakness or numbness.  Describes a feeling of just slightly feeling off balance.    ____________________________________________   PHYSICAL EXAM:  VITAL SIGNS: ED Triage Vitals  Enc Vitals Group     BP 07/14/20 1704 (!) 152/60     Pulse Rate 07/14/20 1704 84     Resp 07/14/20 1704 16     Temp 07/14/20 1704 98.2 F (36.8 C)     Temp Source 07/14/20 1704 Oral     SpO2 07/14/20 1704 99 %     Weight 07/14/20 1656 215 lb (97.5 kg)     Height 07/14/20 1656 5\' 6"  (1.676 m)     Head Circumference --      Peak Flow --      Pain Score 07/14/20  1656 0     Pain Loc --      Pain Edu? --      Excl. in [place]? --     Constitutional: Alert and oriented. Well appearing and in no acute distress. Eyes: Conjunctivae are normal.  Demonstrates normal extraocular movements. Head: Atraumatic. Nose: No congestion/rhinnorhea. Mouth/Throat: Mucous membranes are moist. Neck: No stridor.  Cardiovascular: Normal rate, regular rhythm. Grossly normal heart sounds.  Good peripheral circulation. Respiratory: Normal respiratory effort.  No retractions. Lungs CTAB. Gastrointestinal: Soft and nontender. No distention. Musculoskeletal: No lower extremity tenderness nor edema. Neurologic:  Normal speech and language. No gross focal neurologic deficits are appreciated.  Speech is clear.  No facial droop.  Normal strength all extremities some slight limitation the right lower leg which is reported is chronic. VAN = 0.  Skin:  Skin is warm, dry and intact. No rash noted. Psychiatric: Mood and affect are normal. Speech and behavior are normal.  ____________________________________________   LABS (all labs ordered are listed, but only abnormal results are displayed)  Labs Reviewed  APTT - Abnormal; Notable for the following components:      Result Value   aPTT 40 (*)    All other components within normal limits  CBC - Abnormal; Notable for the following components:   Hemoglobin 11.9 (*)    All other components within normal limits  COMPREHENSIVE METABOLIC PANEL - Abnormal; Notable for the following components:   Glucose, Bld 106 (*)    All other components within normal limits  RESP PANEL BY RT-PCR (FLU A&B, COVID) ARPGX2  PROTIME-INR  DIFFERENTIAL  CBG MONITORING, ED   ____________________________________________  EKG  Reviewed interpreted at 1740 Heart rate 89 QRS 109 QTc 470 Normal sinus rhythm, mild baseline artifact.  No evidence of acute ischemia ____________________________________________  RADIOLOGY  CT HEAD CODE STROKE WO  CONTRAST  Result Date: 07/14/2020 CLINICAL DATA:  Code stroke. Episodic weakness today. Hypertensive. Blurred vision. EXAM: CT HEAD WITHOUT CONTRAST TECHNIQUE: Contiguous axial images were obtained from the base of the skull through the vertex without intravenous contrast. COMPARISON:  11/06/2019 FINDINGS: Brain: Newly seen 4 mm focus of hyperdensity in the left para median pons could be a lacunar infarction with petechial blood. No focal  cerebellar finding. Cerebral hemispheres show chronic small-vessel disease of the white matter and an old infarction in the right parietal cortical and subcortical brain. No acute finding in the cerebral hemispheres. No mass, hydrocephalus or extra-axial collection. Vascular: There is atherosclerotic calcification of the major vessels at the base of the brain. Skull: Negative Sinuses/Orbits: Clear/normal Other: None ASPECTS ([place] Stroke Program Early CT Score) - Ganglionic level infarction (caudate, lentiform nuclei, internal capsule, insula, M1-M3 cortex): 7 - Supraganglionic infarction (M4-M6 cortex): 3 Total score (0-10 with 10 being normal): 10 IMPRESSION: 1. Newly seen 4 mm focus of hyperdensity in the left para median pons could be a lacunar infarction with petechial blood. 2. ASPECTS is 10. 3. Chronic small-vessel disease of the white matter. Old right parietal cortical and subcortical infarction. 4. These results were called by telephone at the time of interpretation on 07/14/2020 at 5:29 pm to provider Lavonia Drafts , who verbally acknowledged these results. Electronically Signed   By: Nelson Chimes M.D.   On: 07/14/2020 17:31    Imaging reviewed, concerning for possible 4 mm focus of hyperdensity in pons.  Also small petechial blood noted.  Discussed this with Dr. Irish Elders who advises continue current aspirin and Plavix, no TPA ____________________________________________   PROCEDURES  Procedure(s) performed: None  Procedures  Critical Care performed:  Yes, see critical care note(s)  CRITICAL CARE Performed by: Delman Kitten   Total critical care time: 30 minutes  Critical care time was exclusive of separately billable procedures and treating other patients.  Critical care was necessary to treat or prevent imminent or life-threatening deterioration.  Critical care was time spent personally by me on the following activities: development of treatment plan with patient and/or surrogate as well as nursing, discussions with consultants, evaluation of patient's response to treatment, examination of patient, obtaining history from patient or surrogate, ordering and performing treatments and interventions, ordering and review of laboratory studies, ordering and review of radiographic studies, pulse oximetry and re-evaluation of patient's condition.  ____________________________________________   INITIAL IMPRESSION / ASSESSMENT AND PLAN / ED COURSE  Pertinent labs & imaging results that were available during my care of the patient were reviewed by me and considered in my medical decision making (see chart for details).   Patient presents concern for possible stroke or neurologic etiology.  Additional differential is broad including metabolic, infectious, vascular, neurologic etiologies, no obvious cardiac or pulmonary symptoms.  Will work-up broadly.  Patient is not a TPA candidate as advised by neurology Dr. Irish Elders who saw and evaluated.  Patient has already taken her aspirin and Plavix today.  Discussed with the patient, understand plan for admission further care management with hospitalist service  ----------------------------------------- 5:37 PM on 07/14/2020 -----------------------------------------  Patient reassessed, she is not have any further additional symptoms arising.  Labs reviewed, metabolic panel normal.  CBC normal except for very slight anemia.    Admission discussed with hospitalist  MD  ____________________________________________   FINAL CLINICAL IMPRESSION(S) / ED DIAGNOSES  Final diagnoses:  Dizziness        Note:  This document was prepared using Dragon voice recognition software and may include unintentional dictation errors       Delman Kitten, MD 07/14/20 1832

## 2020-07-14 NOTE — ED Notes (Signed)
Patient transferred to CT

## 2020-07-14 NOTE — H&P (Signed)
History and Physical  REINETTE CUNEO ZOX:[id number] DOB: [birth year] DOA: 07/14/2020  PCP: Lavera Guise, MD   Chief Complaint: blurred vision  HPI:  79 year old woman PMH stroke, essential hypertension, COPD, presented to the emergency department with sudden onset of blurred vision and lightheadedness.  Admitted for further evaluation of stroke.  Patient has been in usual health lately with no recent problems or illnesses reported.  She felt well this morning.  Approximately 4 PM she noted sudden onset of blurred vision and lightheadedness.  She felt she was not able to walk secondary to these 2 issues.  She called her daughter who checked her blood pressure and found it to be quite high with systolic over 811.  Reported symptoms were similar when she was last admitted for a stroke.  At this point in the emergency department she continues to have some blurred vision about the same.  No difficulty speaking or swallowing.  No focal motor deficits, weakness or decreased sensation.  No specific aggravating or alleviating factors.  Chart review: Marland Kitchen March 2021 presented with dizziness and paresthesias, sas admitted for further evaluation of the same, MRI brain was negative that time, seen by neurology with recommendation to continue aspirin and Plavix. . 2019 admitted for acute CVA, hypertensive urgency  ED Course: Patient has already had aspirin and Plavix today.  Seen by neurology, at this point left pontine infarct felt to be likely incidental, recommendation was to continue aspirin and Plavix, check MRI and echocardiogram, not pursue CT angiogram of the head and neck  Review of Systems:  Negative for fever, sore throat, rash, new muscle aches, chest pain, shortness of breath, dysuria, bleeding, nausea, vomiting, abdominal pain  PMH . Stroke . COPD, continues to smoke "a little bit" . Essential hypertension . Hyperlipidemia . Hypothyroidism . Ordered to be on oxygen at night but patient  discontinued this because of machine noise . Remainder reviewed in Epic  [place] . Vaginal hysterectomy . Bilateral knee replacements . Remainder reviewed in Epic  Family history includes: . Mother with diabetes . Remainder reviewed in [place] History . Continues to smoke cigarettes, few a day . No alcohol  Allergies . Heparin caused nausea and vomiting  Meds include: Current Outpatient Medications  Medication Instructions  . atorvastatin (LIPITOR) 80 mg, Oral, Daily-1800  . calcium carbonate (OS-CAL) 1250 (500 Ca) MG chewable tablet 1 tablet, Daily  . clopidogrel (PLAVIX) 75 mg, Oral, Daily  . cyclobenzaprine (FLEXERIL) 5 mg, Oral, Daily at bedtime  . diphenhydrAMINE HCl (Sleep) (SLEEP AID) 25 mg, At bedtime PRN  . docusate sodium (COLACE) 100 mg, Oral, Daily at bedtime  . donepezil (ARICEPT) 5 mg, Oral, Daily at bedtime  . ergocalciferol (DRISDOL) 50,000 Units, Oral, Weekly  . gabapentin (NEURONTIN) 100 mg, Oral, 3 times daily  . HYDROcodone-acetaminophen (NORCO/VICODIN) 5-325 MG tablet 2 tablets, Oral, 2 times daily PRN  . losartan (COZAAR) 25 mg, Oral, Daily  . metoprolol succinate (TOPROL-XL) 100 mg, Oral, Daily  . venlafaxine XR (EFFEXOR-XR) 75 MG 24 hr capsule TAKE 2 CAPSULES BY MOUTH DAILY.    Physicial Exam   Vitals:  Vitals:   07/14/20 1800 07/14/20 1830  BP: 132/65 (!) 144/105  Pulse: 79 69  Resp: 19 (!) 23  Temp:    SpO2: 95% 96%   Constitutional:   . Appears calm and comfortable in the emergency department Eyes:  . pupils and irises appear normal . Normal lids . Able to read at a distance of a white  board, distance vision appears grossly intact ENMT:  . grossly normal hearing  . Lips appear normal Neck:  . neck appears normal, no masses . no thyromegaly Respiratory:  . CTA bilaterally, no w/r/r.  . Respiratory effort normal.  Cardiovascular:  . RRR, no m/r/g . No LE extremity edema   Abdomen:  . Soft, nontender,  nondistended Musculoskeletal:  . RUE, LUE, RLE, LLE   o strength and tone normal, no atrophy, no abnormal movements o No tenderness, masses Skin:  . No rashes, lesions, ulcers . palpation of skin: no induration or nodules Neurologic:  . CN 2-12 grossly intact Psychiatric:  . Mental status o Mood, affect appropriate . judgment and insight appear intact   I have personally reviewed following labs and imaging studies  Labs:  Marland Kitchen Complete metabolic panel unremarkable . CBC unremarkable  Imaging studies:   CT head new 4 mm focus hyperdensity pons could be lacunar infarction with petechial blood.  Chronic small vessel disease.  Medical tests:   EKG independently reviewed: Sinus rhythm, no acute changes   ASSESSMENT/PLAN  Acute CVA seen on CT, thought incidental finding per neurology --MRI brain, 2D echocardiogram, carotid Dopplers. Per neurology no need for CT angiogram --Continue aspirin and Plavix as per neurology, patient has already had today --Therapy evaluations, neurochecks, stroke pathway  Essential hypertension, fairly well controlled here in the emergency department --Permissive hypertension.  Withhold antihypertensives at this point.  Hyperlipidemia --Continue statin  Blurred vision --Distance vision appears grossly intact.  Presbyopic.  Monitor clinically.  Suspect related to hypertension earlier in the day.  Lightheadedness.  Appears resolved at this time.  COPD --Appears asymptomatic.  Does not use oxygen at home..  Chronic back pain --Continue gabapentin, hydrocodone  DVT prophylaxis: SCDs Code Status: Full per my discussion with patient Family Communication: daughter at bedside Consults called: neurology by EDP    Time spent: 85 minutes  Murray Hodgkins, MD  [facility] Direct contact: see [email or web address]  7PM-7AM contact night coverage as below   1. Check the care team in [facility] and look for a) attending/consulting [facility] provider listed and b)  the [facility] team listed 2. Log into [email or web address] and use [place] universal password to access. If you do not have the password, please contact the hospital operator. 3. Locate the [facility] provider you are looking for under [facility] and page to a number that you can be directly reached. 4. If you still have difficulty reaching the provider, please page the [facility] (Director on Call) for the Hospitalists listed on amion for assistance.  Severity of Illness: The appropriate patient status for this patient is OBSERVATION. Observation status is judged to be reasonable and necessary in order to provide the required intensity of service to ensure the patient's safety. The patient's presenting symptoms, physical exam findings, and initial radiographic and laboratory data in the context of their medical condition is felt to place them at decreased risk for further clinical deterioration. Furthermore, it is anticipated that the patient will be medically stable for discharge from the hospital within 2 midnights of admission. The following factors support the patient status of observation.   " The patient's presenting symptoms include blurred vision. " The physical exam findings include but on examination. " The initial radiographic and laboratory data are stroke seen on CT head.    Status is: Observation  The patient remains OBS appropriate and will d/c before 2 midnights.  Dispo: The patient is from: Home  Anticipated d/c is to: Home              Anticipated d/c date is: 1 day              Patient currently is not medically stable to d/c.   07/14/2020, 7:24 PM   Principal Problem:   CVA (cerebral vascular accident) [facility]) Active Problems:   Essential hypertension   Hyperlipidemia   Chronic back pain   Blurred vision

## 2020-07-14 NOTE — Consult Note (Signed)
Reason for Consult:dizziness  Requesting Physician: Dr. Jacqualine Code  CC: dizziness    HPI: [name] Douglas is an 79 y.o. female with hx of COPD, asthma, DDD, HTN, HLD prior TIA and stroke on ASA 81mg  and Plavix 75 daily last known well at 4 PM presents with blurry vision which has resolved and positional vertigo. Now close to baseline NIIHSS of 0    Past Medical History:  Diagnosis Date  . Anxiety   . Arthritis    "shoulders" (08/20/2014)  . Asthma   . Chronic lower back pain   . Complication of anesthesia    "agitated & restless after knee replacement in 2008"  . COPD (chronic obstructive pulmonary disease) ([place])   . DDD (degenerative disc disease), lumbar   . Depression   . Diverticulosis   . GERD (gastroesophageal reflux disease)   . High cholesterol   . Hypertension   . Hypothyroidism   . On home oxygen therapy    "2L at night" (08/20/2014)  . Pre-diabetes   . Shortness of breath dyspnea    with exertion  . Stroke [facility])     Past Surgical History:  Procedure Laterality Date  . CARDIAC CATHETERIZATION  ~ 2007  . EXCISIONAL HEMORRHOIDECTOMY  1960's  . EYE SURGERY Bilateral    Cataract extraction with IOL  . JOINT REPLACEMENT Left 2009  . KNEE ARTHROSCOPY Left X 2 <2008  . KNEE ARTHROSCOPY WITH SUBCHONDROPLASTY Right 04/19/2016   Procedure: KNEE ARTHROSCOPY WITH SUBCHONDROPLASTY, PARTIAL MENISCECTOMY;  Surgeon: Hessie Knows, MD;  Location: [facility] ORS;  Service: Orthopedics;  Laterality: Right;  . LOOP RECORDER INSERTION N/A 01/02/2018   Procedure: LOOP RECORDER INSERTION;  Surgeon: Deboraha Sprang, MD;  Location: [facility];  Service: Cardiovascular;  Laterality: N/A;  . SHOULDER OPEN ROTATOR CUFF REPAIR Right 2001  . TONSILLECTOMY  ~ 1950  . TOTAL KNEE ARTHROPLASTY Left 2008  . TOTAL KNEE ARTHROPLASTY Right 07/03/2016   Procedure: TOTAL KNEE ARTHROPLASTY;  Surgeon: Hessie Knows, MD;  Location: [facility] ORS;  Service: Orthopedics;  Laterality: Right;  . TUBAL LIGATION  1973   . VAGINAL HYSTERECTOMY  1974    Family History  Problem Relation Age of Onset  . Diabetes Mother   . Hypertension Mother   . Lung cancer Mother   . Congestive Heart Failure Father   . Congestive Heart Failure Sister     Social History:  reports that she has been smoking cigarettes. She has a 13.25 pack-year smoking history. She has never used smokeless tobacco. She reports that she does not drink alcohol and does not use drugs.  Allergies  Allergen Reactions  . Heparin Nausea And Vomiting  . Other     Other reaction(s): Vomiting    Medications: I have reviewed the patient's current medications.  ROS: History obtained from the patient  General ROS: negative for - chills, fatigue, fever, night sweats, weight gain or weight loss Psychological ROS: negative for - behavioral disorder, hallucinations, memory difficulties, mood swings or suicidal ideation Ophthalmic ROS: negative for - blurry vision, double vision, eye pain or loss of vision ENT ROS: negative for - epistaxis, nasal discharge, oral lesions, sore throat, tinnitus or vertigo Allergy and Immunology ROS: negative for - hives or itchy/watery eyes Hematological and Lymphatic ROS: negative for - bleeding problems, bruising or swollen lymph nodes Endocrine ROS: negative for - galactorrhea, hair pattern changes, polydipsia/polyuria or temperature intolerance Respiratory ROS: negative for - cough, hemoptysis, shortness of breath or wheezing Cardiovascular ROS: negative for -  chest pain, dyspnea on exertion, edema or irregular heartbeat Gastrointestinal ROS: negative for - abdominal pain, diarrhea, hematemesis, nausea/vomiting or stool incontinence Genito-Urinary ROS: negative for - dysuria, hematuria, incontinence or urinary frequency/urgency Musculoskeletal ROS: negative for - joint swelling or muscular weakness Neurological ROS: as noted in HPI Dermatological ROS: negative for rash and skin lesion changes  Physical  Examination: Blood pressure (!) 152/60, pulse 84, temperature 98.2 F (36.8 C), temperature source Oral, resp. rate 16, height 5\' 6"  (1.676 m), weight 95.6 kg, SpO2 99 %.    Neurological Examination   Mental Status: Alert, oriented, thought content appropriate.  Speech fluent without evidence of aphasia.  Able to follow 3 step commands without difficulty. Cranial Nerves: II: Discs flat bilaterally; Visual fields grossly normal, pupils equal, round, reactive to light and accommodation III,IV, VI: ptosis not present, extra-ocular motions intact bilaterally V,VII: smile symmetric, facial light touch sensation normal bilaterally VIII: hearing normal bilaterally IX,X: gag reflex present XI: bilateral shoulder shrug XII: midline tongue extension Motor: Right : Upper extremity   5/5    Left:     Upper extremity   5/5  Lower extremity   5/5     Lower extremity   5/5 Tone and bulk:normal tone throughout; no atrophy noted Sensory: Pinprick and light touch intact throughout, bilaterally  Laboratory Studies:   Basic Metabolic Panel: No results for input(s): NA, K, CL, CO2, GLUCOSE, BUN, CREATININE, CALCIUM, MG, PHOS in the last 168 hours.  Liver Function Tests: No results for input(s): AST, ALT, ALKPHOS, BILITOT, PROT, ALBUMIN in the last 168 hours. No results for input(s): LIPASE, AMYLASE in the last 168 hours. No results for input(s): AMMONIA in the last 168 hours.  CBC: Recent Labs  Lab 07/14/20 1731  WBC 8.5  NEUTROABS 6.0  HGB 11.9*  HCT 37.0  MCV 94.9  PLT 286    Cardiac Enzymes: No results for input(s): CKTOTAL, CKMB, CKMBINDEX, TROPONINI in the last 168 hours.  BNP: Invalid input(s): POCBNP  CBG: No results for input(s): GLUCAP in the last 168 hours.  Microbiology: Results for orders placed or performed during the hospital encounter of 11/06/19  SARS CORONAVIRUS 2 (TAT 6-24 HRS) Nasopharyngeal Nasopharyngeal Swab     Status: None   Collection Time: 11/06/19 10:49  PM   Specimen: Nasopharyngeal Swab  Result Value Ref Range Status   SARS Coronavirus 2 NEGATIVE NEGATIVE Final    Comment: (NOTE) SARS-CoV-2 target nucleic acids are NOT DETECTED. The SARS-CoV-2 RNA is generally detectable in upper and lower respiratory specimens during the acute phase of infection. Negative results do not preclude SARS-CoV-2 infection, do not rule out co-infections with other pathogens, and should not be used as the sole basis for treatment or other patient management decisions. Negative results must be combined with clinical observations, patient history, and epidemiological information. The expected result is Negative. Fact Sheet for Patients: SugarRoll.be Fact Sheet for Healthcare Providers: https://www.woods-mathews.com/ This test is not yet approved or cleared by the [place] FDA and  has been authorized for detection and/or diagnosis of SARS-CoV-2 by FDA under an Emergency Use Authorization (EUA). This EUA will remain  in effect (meaning this test can be used) for the duration of the COVID-19 declaration under Section 56 4(b)(1) of the Act, 21 U.S.C. section 360bbb-3(b)(1), unless the authorization is terminated or revoked sooner. Performed at [facility], [place] [address] [place], [place] [address]     Coagulation Studies: No results for input(s): LABPROT, INR in the last 72 hours.  Urinalysis: No results for input(s): COLORURINE, LABSPEC, PHURINE, GLUCOSEU, HGBUR, BILIRUBINUR, KETONESUR, PROTEINUR, UROBILINOGEN, NITRITE, LEUKOCYTESUR in the last 168 hours.  Invalid input(s): APPERANCEUR  Lipid Panel:     Component Value Date/Time   CHOL 136 11/07/2019 0608   CHOL 138 05/07/2019 1409   TRIG 124 11/07/2019 0608   HDL 50 11/07/2019 0608   HDL 49 05/07/2019 1409   CHOLHDL 2.7 11/07/2019 0608   VLDL 25 11/07/2019 0608   LDLCALC 61 11/07/2019 0608   LDLCALC 70 05/07/2019 1409    HgbA1C:  Lab  Results  Component Value Date   HGBA1C 6.1 (H) 11/07/2019    Urine Drug Screen:  No results found for: LABOPIA, COCAINSCRNUR, LABBENZ, AMPHETMU, THCU, LABBARB  Alcohol Level: No results for input(s): ETH in the last 168 hours.  Other results: EKG: normal EKG, normal sinus rhythm, unchanged from previous tracings.  Imaging: CT HEAD CODE STROKE WO CONTRAST  Result Date: 07/14/2020 CLINICAL DATA:  Code stroke. Episodic weakness today. Hypertensive. Blurred vision. EXAM: CT HEAD WITHOUT CONTRAST TECHNIQUE: Contiguous axial images were obtained from the base of the skull through the vertex without intravenous contrast. COMPARISON:  11/06/2019 FINDINGS: Brain: Newly seen 4 mm focus of hyperdensity in the left para median pons could be a lacunar infarction with petechial blood. No focal cerebellar finding. Cerebral hemispheres show chronic small-vessel disease of the white matter and an old infarction in the right parietal cortical and subcortical brain. No acute finding in the cerebral hemispheres. No mass, hydrocephalus or extra-axial collection. Vascular: There is atherosclerotic calcification of the major vessels at the base of the brain. Skull: Negative Sinuses/Orbits: Clear/normal Other: None ASPECTS ([place] Stroke Program Early CT Score) - Ganglionic level infarction (caudate, lentiform nuclei, internal capsule, insula, M1-M3 cortex): 7 - Supraganglionic infarction (M4-M6 cortex): 3 Total score (0-10 with 10 being normal): 10 IMPRESSION: 1. Newly seen 4 mm focus of hyperdensity in the left para median pons could be a lacunar infarction with petechial blood. 2. ASPECTS is 10. 3. Chronic small-vessel disease of the white matter. Old right parietal cortical and subcortical infarction. 4. These results were called by telephone at the time of interpretation on 07/14/2020 at 5:29 pm to provider Lavonia Drafts , who verbally acknowledged these results. Electronically Signed   By: Nelson Chimes M.D.   On:  07/14/2020 17:31     Assessment/Plan:  79 y.o. female with hx of COPD, asthma, DDD, HTN, HLD prior TIA and stroke on ASA 81mg  and Plavix 75 daily last known well at 4 PM presents with blurry vision which has resolved and positional vertigo. Now close to baseline NIIHSS of 0    - CTH reviewed and L pontine pons potential subacute infarct with some petechial hemorrhage which is likely incidental finding as would nto cause vertigo or blurry vision  - con't asa and plavix from home - admission  - MRI in AM - pt/ot - 2d echo - Likely d/c tomorrow morning after the work up.   07/14/2020, 5:39 PM

## 2020-07-14 NOTE — ED Notes (Signed)
Patient's daughter states patient has been more forgetful and refuses to wear her glasses. Patient was able to read stroke exam without difficulty.

## 2020-07-14 NOTE — ED Triage Notes (Addendum)
Pt reports having 2 episodes of weakness today, daughter took her bp 240/114, pt reported to family feeling the way she did when she had a stroke in the past. Pt reports blurred vision that started earlier today then again at 1600 she started having the blurred vision again. Pt reports being staggery with her gait. Pt reports feeling a little better after sitting in the wheelchair. Pt was ambulatory from the car to the registration desk without difficulty Pt reports taking a goodies today

## 2020-07-14 NOTE — ED Notes (Signed)
Pt ambulatory to bedside commode. Family remains at bedside.

## 2020-07-15 ENCOUNTER — Observation Stay: Payer: Medicare Other

## 2020-07-15 ENCOUNTER — Observation Stay (HOSPITAL_BASED_OUTPATIENT_CLINIC_OR_DEPARTMENT_OTHER)
Admit: 2020-07-15 | Discharge: 2020-07-15 | Disposition: A | Discharge status: Home / Self Care | Payer: Medicare Other | Attending: Family Medicine | Admitting: Family Medicine

## 2020-07-15 ENCOUNTER — Observation Stay: Admit: 2020-07-15 | Payer: Medicare Other

## 2020-07-15 DIAGNOSIS — I6523 Occlusion and stenosis of bilateral carotid arteries: Secondary | ICD-10-CM | POA: Diagnosis not present

## 2020-07-15 DIAGNOSIS — G319 Degenerative disease of nervous system, unspecified: Secondary | ICD-10-CM | POA: Diagnosis not present

## 2020-07-15 DIAGNOSIS — I6389 Other cerebral infarction: Secondary | ICD-10-CM

## 2020-07-15 DIAGNOSIS — I7789 Other specified disorders of arteries and arterioles: Secondary | ICD-10-CM | POA: Diagnosis not present

## 2020-07-15 DIAGNOSIS — H538 Other visual disturbances: Secondary | ICD-10-CM | POA: Diagnosis not present

## 2020-07-15 DIAGNOSIS — I613 Nontraumatic intracerebral hemorrhage in brain stem: Secondary | ICD-10-CM | POA: Diagnosis not present

## 2020-07-15 DIAGNOSIS — G8929 Other chronic pain: Secondary | ICD-10-CM | POA: Diagnosis not present

## 2020-07-15 DIAGNOSIS — M549 Dorsalgia, unspecified: Secondary | ICD-10-CM | POA: Diagnosis not present

## 2020-07-15 DIAGNOSIS — I639 Cerebral infarction, unspecified: Secondary | ICD-10-CM | POA: Diagnosis not present

## 2020-07-15 DIAGNOSIS — I351 Nonrheumatic aortic (valve) insufficiency: Secondary | ICD-10-CM

## 2020-07-15 DIAGNOSIS — G9389 Other specified disorders of brain: Secondary | ICD-10-CM | POA: Diagnosis not present

## 2020-07-15 DIAGNOSIS — I10 Essential (primary) hypertension: Secondary | ICD-10-CM | POA: Diagnosis not present

## 2020-07-15 DIAGNOSIS — Z8673 Personal history of transient ischemic attack (TIA), and cerebral infarction without residual deficits: Secondary | ICD-10-CM | POA: Diagnosis not present

## 2020-07-15 LAB — LIPID PANEL
Cholesterol: 186 mg/dL (ref 0–200)
HDL: 50 mg/dL (ref >40)
LDL Cholesterol: 106 mg/dL — ABNORMAL HIGH (ref 0–99)
Total CHOL/HDL Ratio: 3.7 RATIO
Triglycerides: 148 mg/dL (ref <150)
VLDL: 30 mg/dL (ref 0–40)

## 2020-07-15 LAB — HEMOGLOBIN A1C
Hgb A1c MFr Bld: 6 % — ABNORMAL HIGH (ref 4.8–5.6)
Mean Plasma Glucose: 125.5 mg/dL

## 2020-07-15 LAB — ECHOCARDIOGRAM COMPLETE
Height: 66 in
S' Lateral: 3.35 cm
Weight: 3371.2 oz

## 2020-07-15 MED ORDER — LOSARTAN POTASSIUM 25 MG PO TABS
25.0000 mg | ORAL_TABLET | Freq: Every day | ORAL | Status: DC
  Administered 2020-07-15: 25 mg via ORAL
  Filled 2020-07-15: qty 1

## 2020-07-15 MED ORDER — METOPROLOL SUCCINATE ER 50 MG PO TB24
100.0000 mg | ORAL_TABLET | Freq: Every day | ORAL | Status: DC
  Administered 2020-07-15: 100 mg via ORAL
  Filled 2020-07-15: qty 2

## 2020-07-15 MED ORDER — INFLUENZA VAC A&B SA ADJ QUAD 0.5 ML IM PRSY
0.5000 mL | PREFILLED_SYRINGE | INTRAMUSCULAR | Status: AC
  Administered 2020-07-15: 17:00:00 0.5 mL via INTRAMUSCULAR
  Filled 2020-07-15 (×2): qty 0.5

## 2020-07-15 MED ORDER — VENLAFAXINE HCL ER 75 MG PO CP24
150.0000 mg | ORAL_CAPSULE | Freq: Every day | ORAL | Status: DC
  Administered 2020-07-15: 150 mg via ORAL
  Filled 2020-07-15: qty 2

## 2020-07-15 NOTE — Discharge Instructions (Signed)

## 2020-07-15 NOTE — Evaluation (Signed)
Physical Therapy Evaluation Patient Details Name: [name] Douglas MRN: [id number] DOB: 06-14-1941 Today's Date: 07/15/2020   History of Present Illness  79 year old woman PMH stroke, essential hypertension, COPD, presented to the emergency department with sudden onset of blurred vision and lightheadedness; admitted for TIA/CVA work up.  Imaging significant for L pons hemorrhage, stable between images.  Clinical Impression  Patient resting in bed, daughter at bedside upon arrival to room (daughter left during session to get drink). Patient alert and oriented; agreeable to participation with session, eager/hopeful for upcoming discharge home.  Endorses pain in low back, FACES 6-8/10; chronic, unchanged with this admission.  Mild visual blurriness persists, but marked improvement since admission per patient report.  Bilat UE/LE strength and ROM grossly symmetrical and WFL; no focal weakness, sensory or coordination deficit appreciated.  Does endorse intermittent radicular symptoms/pain down R LE at times; not present during session.  Able to complete bed mobility with mod indep; sit/stand, basic transfers and gait (100') with RW, cga/close sup.  Demonstrates reciprocal stepping pattern with fair step height/length; mild forward trunk flexion, improved with cuing for postural extension and walker position.  Intermittent grimacing due to pain in R hip, improved with use of RW to offset LE WBing. Do recommend continued use of RW at all times upon dischage; patient voices understanding and agreement 5x sit/stand with bilat UE  support x37 seconds.  Requires bilat UE support to safely complete; decreased functional strength of bilat LEs, decreased eccentric control with stand to sit.  Requiring increased time compared to age-matched norms; indicative of increased fall risk.  Continued recommendation for RW at all times. Would benefit from skilled PT to address above deficits and promote optimal return to PLOF.;  Recommend transition to HHPT upon discharge from acute hospitalization.     Follow Up Recommendations Home health PT    Equipment Recommendations   (has RW, [id number])    Recommendations for Other Services       Precautions / Restrictions Precautions Precautions: Fall Restrictions Weight Bearing Restrictions: No      Mobility  Bed Mobility Overal bed mobility: Modified Independent             General bed mobility comments: Increased time / slow movements 2/2 low back pain     Transfers Overall transfer level: Needs assistance Equipment used: Rolling walker (2 wheeled) Transfers: Sit to/from Stand Sit to Stand: Supervision         General transfer comment: cuing for hand placement to prevent pulling on RW  Ambulation/Gait Ambulation/Gait assistance: Supervision Gait Distance (Feet): 100 Feet Assistive device: Rolling walker (2 wheeled)       General Gait Details: reciprocal stepping pattern with fair step height/length; mild forward trunk flexion, improved with cuing for postural extension and walker position.  Intermittent grimacing due to pain in R hip, improved with use of RW to offset LE WBing. Do recommend continued use of RW at all times upon dischage; patient voices understanding and agreement  Stairs            Wheelchair Mobility    Modified Rankin (Stroke Patients Only)       Balance Overall balance assessment: Needs assistance Sitting-balance support: No upper extremity supported;Feet supported Sitting balance-Leahy Scale: Normal     Standing balance support: Bilateral upper extremity supported Standing balance-Leahy Scale: Fair Standing balance comment: furniture walking  Pertinent Vitals/Pain Pain Assessment: No/denies pain Faces Pain Scale: Hurts whole lot Pain Location: low back Pain Descriptors / Indicators: Discomfort;Grimacing Pain Intervention(s): Monitored during  session;Repositioned;Limited activity within patient's tolerance    Home Living Family/patient expects to be discharged to:: Private residence Living Arrangements: Alone Available Help at Discharge: Family;Available PRN/intermittently Type of Home: Apartment Home Access: Elevator     Home Layout: One level Home Equipment: Walker - 4 wheels;Cane - single point;Shower seat;Hand held Tourist information centre manager - 2 wheels Additional Comments: Lives on 4th floor apartment with elevator    Prior Function Level of Independence: Independent         Comments: Ind ADLs (sponge bathes 2/2 fear of falling in tub) and household/community mobility (has AD but does not use, endorses furniture walking). Was driving and Ind IADLs until ~02/2020 r/t low back pain - scheduled surgery in ~2 weeks     Hand Dominance   Dominant Hand: Right    Extremity/Trunk Assessment   Upper Extremity Assessment Upper Extremity Assessment: Overall WFL for tasks assessed    Lower Extremity Assessment Lower Extremity Assessment: Overall WFL for tasks assessed (grossly at least 4+/5 throughout; no focal weakness, sensory or coordination deficit. Does endorse radicular pain down R LE at times; not present in session today.)       Communication   Communication: No difficulties  Cognition Arousal/Alertness: Awake/alert Behavior During Therapy: WFL for tasks assessed/performed Overall Cognitive Status: Within Functional Limits for tasks assessed                                        General Comments      Exercises Other Exercises Other Exercises: 10' without assist device, min assist-decreased stance time, loading phase to R LE due to pain; generally unsteady, consistetnly reaching for walls/furniture for support Other Exercises: 5x sit/stand with bilat UE  support x37 seconds.  Requires bilat UE support to safely complete; decreased functional strength of bilat LEs, decreased eccentric control with  stand to sit.  Requiring increased time compared to age-matched norms; indicative of increased fall risk.  Continued recommendation for RW at all times.   Assessment/Plan    PT Assessment Patient needs continued PT services  PT Problem List Decreased activity tolerance;Decreased balance;Decreased mobility;Decreased knowledge of use of DME;Decreased safety awareness;Decreased knowledge of precautions;Pain       PT Treatment Interventions DME instruction;Gait training;Stair training;Functional mobility training;Therapeutic activities;Therapeutic exercise;Balance training;Patient/family education    PT Goals (Current goals can be found in the Care Plan section)  Acute Rehab PT Goals Patient Stated Goal: To return home  PT Goal Formulation: With patient Time For Goal Achievement: 07/29/20 Potential to Achieve Goals: Good    Frequency Min 2X/week   Barriers to discharge        Co-evaluation               AM-PAC PT "6 Clicks" Mobility  Outcome Measure Help needed turning from your back to your side while in a flat bed without using bedrails?: None Help needed moving from lying on your back to sitting on the side of a flat bed without using bedrails?: None Help needed moving to and from a bed to a chair (including a wheelchair)?: None Help needed standing up from a chair using your arms (e.g., wheelchair or bedside chair)?: None Help needed to walk in hospital room?: None Help needed climbing 3-5 steps with a railing? :  A Little 6 Click Score: 23    End of Session Equipment Utilized During Treatment: Gait belt Activity Tolerance: Patient tolerated treatment well;Patient limited by pain Patient left: in bed;with call bell/phone within reach;with bed alarm set Nurse Communication: Mobility status PT Visit Diagnosis: Muscle weakness (generalized) (M62.81);Difficulty in walking, not elsewhere classified (R26.2);Pain    Time: 0397-9536 PT Time Calculation (min) (ACUTE ONLY): 21  min   Charges:   PT Evaluation $PT Eval Moderate Complexity: 1 Mod PT Treatments $Therapeutic Activity: 8-22 mins       Ragna Kramlich H. Owens Shark, PT, DPT, NCS 07/15/20, 2:54 PM [phone]

## 2020-07-15 NOTE — TOC Initial Note (Addendum)
Transition of Care [facility]) - Initial/Assessment Note    Patient Details  Name: [name] Douglas MRN: [id number] Date of Birth: 09-04-1940  [facility]) CM/SW Contact:    Magnus Ivan, LCSW Phone Number: 07/15/2020, 2:56 PM  Clinical Narrative:        CSW spoke to patient. Patient reported she lives alone and her daughter takes her to appointments. PCP is Dr. Humphrey Rolls. Pharmacy is [facility]. Patient has a RW, cane, and motorized scooter at home. Patient used [facility] in the past, could not recall agency used. Patient also went to [facility] in [place] in the past for SNF rehab. CSW informed patient of recommendations for HHPT and 3 in 1. Patient agreeable to both recommendations, no agency preference. CSW made referral to [facility] with [facility] for 3 in 1 and Cheryl with [facility] for HHPT. Made both agencies aware of plan to discharge today. Asked MD for orders. Patient's daughter will pick her up at time of discharge. No additional needs at this time.            Expected Discharge Plan: [place] Barriers to Discharge: Barriers Resolved   Patient Goals and CMS Choice Patient states their goals for this hospitalization and ongoing recovery are:: to return home with home health CMS Medicare.gov Compare Post Acute Care list provided to:: Patient Choice offered to / list presented to : Patient  Expected Discharge Plan and Services Expected Discharge Plan: [place]       Living arrangements for the past 2 months: Single Family Home                 DME Arranged: 3-N-1 DME Agency: [facility] Date DME Agency Contacted: 07/15/20   Representative spoke with at DME Agency: [place]: PT [place]: [place] Date [place]: 07/15/20   Representative spoke with at [place]: Malachy Mood  Prior Living Arrangements/Services Living arrangements for the past 2 months: [place] Lives with:: Self Patient language  and need for interpreter reviewed:: Yes Do you feel safe going back to the place where you live?: Yes      Need for Family Participation in Patient Care: Yes (Comment) Care giver support system in place?: Yes (comment) Current home services: DME Criminal Activity/Legal Involvement Pertinent to Current Situation/Hospitalization: No - Comment as needed  Activities of Daily Living Home Assistive Devices/Equipment: Walker (specify type) (intermittent) ADL Screening (condition at time of admission) Patient's cognitive ability adequate to safely complete daily activities?: Yes Is the patient deaf or have difficulty hearing?: No Does the patient have difficulty seeing, even when wearing glasses/contacts?: No Does the patient have difficulty concentrating, remembering, or making decisions?: No Patient able to express need for assistance with ADLs?: No Does the patient have difficulty dressing or bathing?: Yes Independently performs ADLs?: Yes (appropriate for developmental age) Does the patient have difficulty walking or climbing stairs?: Yes Weakness of Legs: Right (chronic ) Weakness of Arms/Hands: None  Permission Sought/Granted Permission sought to share information with : Chartered certified accountant granted to share information with : Yes, Verbal Permission Granted     Permission granted to share info w AGENCY: [place], DME agencies        Emotional Assessment       Orientation: : Oriented to Situation, Oriented to  Time, Oriented to Place, Oriented to Self Alcohol / Substance Use: Not Applicable Psych Involvement: No (comment)  Admission diagnosis:  Dizziness [R42] CVA (  cerebral vascular accident) [facility]) [I63.9] Patient Active Problem List   Diagnosis Date Noted  . CVA (cerebral vascular accident) ([place]) 07/14/2020  . Blurred vision 07/14/2020  . Muscle spasm 04/10/2020  . Urinary tract infection with hematuria 04/10/2020  . Headache disorder 12/21/2019  .  Dizziness 11/06/2019  . Atopic dermatitis 10/28/2019  . History of CVA (cerebrovascular accident) 10/07/2019  . Intractable episodic cluster headache 09/20/2019  . Other symptoms and signs involving the nervous system 09/20/2019  . Iron deficiency anemia 05/17/2019  . Vitamin D deficiency 05/17/2019  . Flu vaccine need 05/17/2019  . Encounter for general adult medical examination with abnormal findings 04/05/2019  . Nausea and vomiting 04/05/2019  . Dysuria 04/05/2019  . Need for vaccination against Streptococcus pneumoniae using pneumococcal conjugate vaccine 7 04/05/2019  . Occlusion and stenosis of bilateral carotid arteries 11/24/2017  . Subarachnoid hemorrhage ([place]) 12/09/2016  . L brain TIA (transient ischemic attack) 08/24/2016  . Solitary pulmonary nodule 08/24/2016  . Aphasia   . Cellulitis of right knee 07/17/2016  . Primary localized osteoarthritis of right knee 07/03/2016  . Ileus ([place]) 04/24/2016  . Generalized abdominal pain   . Colitis due to Clostridium difficile   . Recurrent Clostridium difficile diarrhea   . Enteritis due to Clostridium difficile 08/22/2014  . Diarrhea 08/20/2014  . Essential hypertension 08/20/2014  . Hyperlipidemia 08/20/2014  . Hypothyroidism 08/20/2014  . Tobacco abuse 08/20/2014  . COPD (chronic obstructive pulmonary disease) ([place]) 08/20/2014  . Chronic back pain 08/20/2014  . Acute diarrhea 07/27/2014   PCP:  Lavera Guise, MD Pharmacy:   [place], [place] - [address] [address] Phone: [phone] Fax: [phone]     Social Determinants of Health (SDOH) Interventions    Readmission Risk Interventions No flowsheet data found.

## 2020-07-15 NOTE — Progress Notes (Signed)
SLP Cancellation Note  Patient Details Name: [name] Douglas MRN: [id number] DOB: 12-27-1940   Cancelled treatment:       Reason Eval/Treat Not Completed: SLP screened, no needs identified, will sign off  Pt and daughter endorse a gradual decline in memory for which they have compensated for. This has not worsened or changed during this admission. Recommend pt follow up with her neurologist and adhere to any recommendations that occur.   [name] B. Rutherford Nail M.S., CCC-SLP, [place] Office [phone]  [name] 07/15/2020, 2:14 PM

## 2020-07-15 NOTE — Evaluation (Signed)
Occupational Therapy Evaluation Patient Details Name: [name] Douglas MRN: [id number] DOB: 08-02-1941 Today's Date: 07/15/2020    History of Present Illness 79 year old woman PMH stroke, essential hypertension, COPD, presented to the emergency department with sudden onset of blurred vision and lightheadedness.  Admitted for further evaluation of stroke.   Clinical Impression   [name] Douglas was seen for OT evaluation this date. Prior to hospital admission, pt was Independent in ADLs (sponge bathes 2/2 fear of falling in tub) and household/community mobility (has AD but does not use, endorses furniture walking). Pt was driving and Independent c IADLs until ~02/2020 r/t severe low back pain - scheduled surgery in ~2 weeks. Pt lives alone in apartment c elevator access, DTR available PRN. Currently pt reporting symptoms have resolved. Pt noted to furniture walk during toilet t/f requiring CGA - improving to [place], requires VCs for safety. Mobility deficits appear r/t chronic low back pain, pt reports near recent baseline independence to perform ADL and mobility tasks and no strength, sensory, coordination, cognitive, or visual deficits appreciated with assessment. No skilled OT needs identified. Will sign off. Please re-consult if additional OT needs arise.     Follow Up Recommendations  No OT follow up;Supervision - Intermittent    Equipment Recommendations  3 in 1 bedside commode    Recommendations for Other Services       Precautions / Restrictions Precautions Precautions: Fall Restrictions Weight Bearing Restrictions: No      Mobility Bed Mobility Overal bed mobility: Independent      General bed mobility comments: Increased time / slow movements 2/2 low back pain     Transfers Overall transfer level: Needs assistance Equipment used: None Transfers: Sit to/from Stand Sit to Stand: Supervision         General transfer comment: Increased time + SBA     Balance  Overall balance assessment: Needs assistance Sitting-balance support: No upper extremity supported;Feet supported Sitting balance-Leahy Scale: Normal     Standing balance support: No upper extremity supported;During functional activity Standing balance-Leahy Scale: Poor Standing balance comment: furniture walking        ADL either performed or assessed with clinical judgement   ADL Overall ADL's : Needs assistance/impaired       General ADL Comments: SUPERVISION + RW for toilet t/f - assist for safety and back pain causing LOBs                  Pertinent Vitals/Pain Pain Assessment: Faces Faces Pain Scale: Hurts whole lot Pain Location: low back Pain Descriptors / Indicators: Discomfort;Grimacing Pain Intervention(s): Limited activity within patient's tolerance;Premedicated before session     Hand Dominance Right   Extremity/Trunk Assessment Upper Extremity Assessment Upper Extremity Assessment: Generalized weakness   Lower Extremity Assessment Lower Extremity Assessment: Generalized weakness       Communication Communication Communication: No difficulties   Cognition Arousal/Alertness: Awake/alert Behavior During Therapy: WFL for tasks assessed/performed Overall Cognitive Status: Within Functional Limits for tasks assessed        General Comments       Exercises Exercises: Other exercises Other Exercises Other Exercises: Pt and family educated re: OT role, DME recs, d/c recs, falls prevention, stroke education Other Exercises: LBD, toileting, hand washing, sup<>sit, sit<>stand, sitting/standing balance/tolerance   Shoulder Instructions      Home Living Family/patient expects to be discharged to:: Private residence Living Arrangements: Alone Available Help at Discharge: Family;Available PRN/intermittently Type of Home: Apartment Home Access: Elevator     Home Layout:  One level     Bathroom Shower/Tub: Teacher, early years/pre:  Standard     Home Equipment: Environmental consultant - 4 wheels;Cane - single point;Shower seat;Hand held Tourist information centre manager - 2 wheels   Additional Comments: Lives on 4th floor apartment with elevator      Prior Functioning/Environment Level of Independence: Independent        Comments: Ind ADLs (sponge bathes 2/2 fear of falling in tub) and household/community mobility (has AD but does not use, endorses furniture walking). Was driving and Ind IADLs until ~02/2020 r/t low back pain - scheduled surgery in ~2 weeks        OT Problem List: Decreased activity tolerance;Impaired balance (sitting and/or standing);Pain      OT Treatment/Interventions:      OT Goals(Current goals can be found in the care plan section) Acute Rehab OT Goals Patient Stated Goal: To return home  OT Goal Formulation: With patient/family Time For Goal Achievement: 07/29/20 Potential to Achieve Goals: Good   AM-PAC OT "6 Clicks" Daily Activity     Outcome Measure Help from another person eating meals?: None Help from another person taking care of personal grooming?: None Help from another person toileting, which includes using toliet, bedpan, or urinal?: A Little Help from another person bathing (including washing, rinsing, drying)?: A Little Help from another person to put on and taking off regular upper body clothing?: None Help from another person to put on and taking off regular lower body clothing?: A Little 6 Click Score: 21   End of Session Equipment Utilized During Treatment: Rolling walker Nurse Communication: Mobility status  Activity Tolerance: Patient tolerated treatment well Patient left: in bed;with call bell/phone within reach;with bed alarm set;with family/visitor present  OT Visit Diagnosis: Other abnormalities of gait and mobility (R26.89)                Time: 1448-1856 OT Time Calculation (min): 25 min Charges:  OT General Charges $OT Visit: 1 Visit OT Evaluation $OT Eval Low Complexity: 1  Low OT Treatments $Self Care/Home Management : 23-37 mins  Dessie Coma, M.S. OTR/L  07/15/20, 12:57 PM  ascom [phone]

## 2020-07-15 NOTE — Progress Notes (Signed)
*  PRELIMINARY RESULTS* Echocardiogram 2D Echocardiogram has been performed.  [name] Douglas 07/15/2020, 11:21 AM

## 2020-07-15 NOTE — Progress Notes (Signed)
Subjective: Improved. Pt is close to baseline. Dizziness and blurry vision resolved.    HPI: [name] Douglas is an 79 y.o. female with hx of COPD, asthma, DDD, HTN, HLD prior TIA and stroke onPlavix 75 daily last known well at 4 PM presents with blurry vision which has resolved and positional vertigo. Now close to baseline NIIHSS of 0    Past Medical History:  Diagnosis Date  . Anxiety   . Arthritis    "shoulders" (08/20/2014)  . Asthma   . Chronic lower back pain   . Complication of anesthesia    "agitated & restless after knee replacement in 2008"  . COPD (chronic obstructive pulmonary disease) ([place])   . DDD (degenerative disc disease), lumbar   . Depression   . Diverticulosis   . GERD (gastroesophageal reflux disease)   . High cholesterol   . Hypertension   . Hypothyroidism   . On home oxygen therapy    "2L at night" (08/20/2014)  . Pre-diabetes   . Shortness of breath dyspnea    with exertion  . Stroke [facility])     Past Surgical History:  Procedure Laterality Date  . CARDIAC CATHETERIZATION  ~ 2007  . EXCISIONAL HEMORRHOIDECTOMY  1960's  . EYE SURGERY Bilateral    Cataract extraction with IOL  . JOINT REPLACEMENT Left 2009  . KNEE ARTHROSCOPY Left X 2 <2008  . KNEE ARTHROSCOPY WITH SUBCHONDROPLASTY Right 04/19/2016   Procedure: KNEE ARTHROSCOPY WITH SUBCHONDROPLASTY, PARTIAL MENISCECTOMY;  Surgeon: Hessie Knows, MD;  Location: [facility];  Service: Orthopedics;  Laterality: Right;  . LOOP RECORDER INSERTION N/A 01/02/2018   Procedure: LOOP RECORDER INSERTION;  Surgeon: Deboraha Sprang, MD;  Location: [place] [facility];  Service: Cardiovascular;  Laterality: N/A;  . SHOULDER OPEN ROTATOR CUFF REPAIR Right 2001  . TONSILLECTOMY  ~ 1950  . TOTAL KNEE ARTHROPLASTY Left 2008  . TOTAL KNEE ARTHROPLASTY Right 07/03/2016   Procedure: TOTAL KNEE ARTHROPLASTY;  Surgeon: Hessie Knows, MD;  Location: [facility];  Service: Orthopedics;  Laterality: Right;  . TUBAL LIGATION  1973  .  VAGINAL HYSTERECTOMY  1974    Family History  Problem Relation Age of Onset  . Diabetes Mother   . Hypertension Mother   . Lung cancer Mother   . Congestive Heart Failure Father   . Congestive Heart Failure Sister     Social History:  reports that she has been smoking cigarettes. She has a 13.25 pack-year smoking history. She has never used smokeless tobacco. She reports that she does not drink alcohol and does not use drugs.  Allergies  Allergen Reactions  . Heparin Nausea And Vomiting    Medications: I have reviewed the patient's current medications.  ROS: History obtained from the patient  General ROS: negative for - chills, fatigue, fever, night sweats, weight gain or weight loss Psychological ROS: negative for - behavioral disorder, hallucinations, memory difficulties, mood swings or suicidal ideation Ophthalmic ROS: negative for - blurry vision, double vision, eye pain or loss of vision ENT ROS: negative for - epistaxis, nasal discharge, oral lesions, sore throat, tinnitus or vertigo Allergy and Immunology ROS: negative for - hives or itchy/watery eyes Hematological and Lymphatic ROS: negative for - bleeding problems, bruising or swollen lymph nodes Endocrine ROS: negative for - galactorrhea, hair pattern changes, polydipsia/polyuria or temperature intolerance Respiratory ROS: negative for - cough, hemoptysis, shortness of breath or wheezing Cardiovascular ROS: negative for - chest pain, dyspnea on exertion, edema or irregular heartbeat Gastrointestinal ROS: negative  for - abdominal pain, diarrhea, hematemesis, nausea/vomiting or stool incontinence Genito-Urinary ROS: negative for - dysuria, hematuria, incontinence or urinary frequency/urgency Musculoskeletal ROS: negative for - joint swelling or muscular weakness Neurological ROS: as noted in HPI Dermatological ROS: negative for rash and skin lesion changes  Physical Examination: Blood pressure (!) 189/65, pulse 64,  temperature 98 F (36.7 C), temperature source Oral, resp. rate 16, height 5\' 6"  (1.676 m), weight 95.6 kg, SpO2 100 %.    Neurological Examination   Mental Status: Alert, oriented, thought content appropriate.  Speech fluent without evidence of aphasia.  Able to follow 3 step commands without difficulty. Cranial Nerves: II: Discs flat bilaterally; Visual fields grossly normal, pupils equal, round, reactive to light and accommodation III,IV, VI: ptosis not present, extra-ocular motions intact bilaterally V,VII: smile symmetric, facial light touch sensation normal bilaterally VIII: hearing normal bilaterally IX,X: gag reflex present XI: bilateral shoulder shrug XII: midline tongue extension Motor: Right : Upper extremity   5/5    Left:     Upper extremity   5/5  Lower extremity   5/5     Lower extremity   5/5 Tone and bulk:normal tone throughout; no atrophy noted Sensory: Pinprick and light touch intact throughout, bilaterally  Laboratory Studies:   Basic Metabolic Panel: Recent Labs  Lab 07/14/20 1731  NA 143  K 4.0  CL 103  CO2 30  GLUCOSE 106*  BUN 16  CREATININE 0.89  CALCIUM 9.3    Liver Function Tests: Recent Labs  Lab 07/14/20 1731  AST 20  ALT 13  ALKPHOS 65  BILITOT 0.4  PROT 7.4  ALBUMIN 4.3   No results for input(s): LIPASE, AMYLASE in the last 168 hours. No results for input(s): AMMONIA in the last 168 hours.  CBC: Recent Labs  Lab 07/14/20 1731  WBC 8.5  NEUTROABS 6.0  HGB 11.9*  HCT 37.0  MCV 94.9  PLT 286    Cardiac Enzymes: No results for input(s): CKTOTAL, CKMB, CKMBINDEX, TROPONINI in the last 168 hours.  BNP: Invalid input(s): POCBNP  CBG: No results for input(s): GLUCAP in the last 168 hours.  Microbiology: Results for orders placed or performed during the hospital encounter of 07/14/20  Resp Panel by RT-PCR (Flu A&B, Covid) Nasopharyngeal Swab     Status: None   Collection Time: 07/14/20  6:53 PM   Specimen:  Nasopharyngeal Swab; Nasopharyngeal(NP) swabs in vial transport medium  Result Value Ref Range Status   SARS Coronavirus 2 by RT PCR NEGATIVE NEGATIVE Final    Comment: (NOTE) SARS-CoV-2 target nucleic acids are NOT DETECTED.  The SARS-CoV-2 RNA is generally detectable in upper respiratory specimens during the acute phase of infection. The lowest concentration of SARS-CoV-2 viral copies this assay can detect is 138 copies/mL. A negative result does not preclude SARS-Cov-2 infection and should not be used as the sole basis for treatment or other patient management decisions. A negative result may occur with  improper specimen collection/handling, submission of specimen other than nasopharyngeal swab, presence of viral mutation(s) within the areas targeted by this assay, and inadequate number of viral copies(<138 copies/mL). A negative result must be combined with clinical observations, patient history, and epidemiological information. The expected result is Negative.  Fact Sheet for Patients:  EntrepreneurPulse.com.au  Fact Sheet for Healthcare Providers:  IncredibleEmployment.be  This test is no t yet approved or cleared by the [place] FDA and  has been authorized for detection and/or diagnosis of SARS-CoV-2 by FDA under an Emergency Use Authorization (  EUA). This EUA will remain  in effect (meaning this test can be used) for the duration of the COVID-19 declaration under Section 564(b)(1) of the Act, 21 U.S.C.section 360bbb-3(b)(1), unless the authorization is terminated  or revoked sooner.       Influenza A by PCR NEGATIVE NEGATIVE Final   Influenza B by PCR NEGATIVE NEGATIVE Final    Comment: (NOTE) The Xpert Xpress SARS-CoV-2/FLU/RSV plus assay is intended as an aid in the diagnosis of influenza from Nasopharyngeal swab specimens and should not be used as a sole basis for treatment. Nasal washings and aspirates are unacceptable for  Xpert Xpress SARS-CoV-2/FLU/RSV testing.  Fact Sheet for Patients: EntrepreneurPulse.com.au  Fact Sheet for Healthcare Providers: IncredibleEmployment.be  This test is not yet approved or cleared by the [place] FDA and has been authorized for detection and/or diagnosis of SARS-CoV-2 by FDA under an Emergency Use Authorization (EUA). This EUA will remain in effect (meaning this test can be used) for the duration of the COVID-19 declaration under Section 564(b)(1) of the Act, 21 U.S.C. section 360bbb-3(b)(1), unless the authorization is terminated or revoked.  Performed at [facility], [address] [place], [place] [address]     Coagulation Studies: Recent Labs    07/14/20 1731  LABPROT 12.4  INR 1.0    Urinalysis: No results for input(s): COLORURINE, LABSPEC, PHURINE, GLUCOSEU, HGBUR, BILIRUBINUR, KETONESUR, PROTEINUR, UROBILINOGEN, NITRITE, LEUKOCYTESUR in the last 168 hours.  Invalid input(s): APPERANCEUR  Lipid Panel:     Component Value Date/Time   CHOL 186 07/15/2020 0437   CHOL 138 05/07/2019 1409   TRIG 148 07/15/2020 0437   HDL 50 07/15/2020 0437   HDL 49 05/07/2019 1409   CHOLHDL 3.7 07/15/2020 0437   VLDL 30 07/15/2020 0437   LDLCALC 106 (H) 07/15/2020 0437   LDLCALC 70 05/07/2019 1409    HgbA1C:  Lab Results  Component Value Date   HGBA1C 6.1 (H) 11/07/2019    Urine Drug Screen:  No results found for: LABOPIA, COCAINSCRNUR, LABBENZ, AMPHETMU, THCU, LABBARB  Alcohol Level: No results for input(s): ETH in the last 168 hours.  Other results: EKG: normal EKG, normal sinus rhythm, unchanged from previous tracings.  Imaging: MR BRAIN WO CONTRAST  Result Date: 07/15/2020 CLINICAL DATA:  Stroke follow-up. EXAM: MRI HEAD WITHOUT CONTRAST TECHNIQUE: Multiplanar, multiecho pulse sequences of the brain and surrounding structures were obtained without intravenous contrast. COMPARISON:  CT head July 14, 2020. FINDINGS: Brain: Focus of susceptibility artifact and T1 hyperintensity within the left pons, compatible with a focus of hemorrhage when correlating with recent CT head. There is mild associated edema without significant mass effect. Acute blood products limit evaluation for underlying infarct in this region. Otherwise, no evidence of acute infarct. Remote right parietal infarct with encephalomalacia and surrounding gliosis. No hydrocephalus. No mass lesion or midline shift. Scattered T2/FLAIR hyperintensities within the white matter, compatible with chronic microvascular ischemic disease. Generalized cerebral atrophy with ex vacuo ventricular dilation. Vascular: Major arterial flow voids are maintained at the skull base. Skull and upper cervical spine: Normal marrow signal. Sinuses/Orbits: Visualized sinuses are clear.  Unremarkable orbits. Other: No mastoid effusions. IMPRESSION: 1. Acute/subacute left pontine hemorrhage, similar in size when comparing across modalities. This could represent a spontaneous hemorrhage (potentially hypertensive) or hemorrhagic conversion of a lacunar infarct with acute blood products limiting evaluation. Mild associated edema without substantial mass effect. No hydrocephalus. 2. Chronic microvascular ischemic disease and remote right parietal infarct. Electronically Signed   By: Margaretha Sheffield  MD   On: 07/15/2020 09:59   CT HEAD CODE STROKE WO CONTRAST  Result Date: 07/14/2020 CLINICAL DATA:  Code stroke. Episodic weakness today. Hypertensive. Blurred vision. EXAM: CT HEAD WITHOUT CONTRAST TECHNIQUE: Contiguous axial images were obtained from the base of the skull through the vertex without intravenous contrast. COMPARISON:  11/06/2019 FINDINGS: Brain: Newly seen 4 mm focus of hyperdensity in the left para median pons could be a lacunar infarction with petechial blood. No focal cerebellar finding. Cerebral hemispheres show chronic small-vessel disease of the white  matter and an old infarction in the right parietal cortical and subcortical brain. No acute finding in the cerebral hemispheres. No mass, hydrocephalus or extra-axial collection. Vascular: There is atherosclerotic calcification of the major vessels at the base of the brain. Skull: Negative Sinuses/Orbits: Clear/normal Other: None ASPECTS ([place] Stroke Program Early CT Score) - Ganglionic level infarction (caudate, lentiform nuclei, internal capsule, insula, M1-M3 cortex): 7 - Supraganglionic infarction (M4-M6 cortex): 3 Total score (0-10 with 10 being normal): 10 IMPRESSION: 1. Newly seen 4 mm focus of hyperdensity in the left para median pons could be a lacunar infarction with petechial blood. 2. ASPECTS is 10. 3. Chronic small-vessel disease of the white matter. Old right parietal cortical and subcortical infarction. 4. These results were called by telephone at the time of interpretation on 07/14/2020 at 5:29 pm to provider Lavonia Drafts , who verbally acknowledged these results. Electronically Signed   By: Nelson Chimes M.D.   On: 07/14/2020 17:31     Assessment/Plan:  79 y.o. female with hx of COPD, asthma, DDD, HTN, HLD prior TIA and stroke on Plavix 75 daily last known well at 4 PM presents with blurry vision which has resolved and positional vertigo. Now close to baseline NIIHSS of 0    - CTH reviewed and L pontine pons potential subacute infarct with some petechial hemorrhage which is likely incidental finding as would nto cause vertigo or blurry vision  - Imaging reviewed and pt does have similar size L pontine suacute stroke with mild hemorrhage - Can continue plavix.  - appreciate pt/ot - pt has back surgery in about 2 weeks which I am ok with from Neurological perspective - finishing up her stroke work up and possible d/c later today   07/15/2020, 11:48 AM

## 2020-07-15 NOTE — Discharge Summary (Signed)
Physician Discharge Summary  [name] Douglas DGL:[id number] DOB: [birth year]  PCP: Lavera Guise, MD  Admitted from: Home Discharged to: Home  Admit date: 07/14/2020 Discharge date: 07/15/2020  Recommendations for Outpatient Follow-up:    Follow-up Information    Lavera Guise, MD. Schedule an appointment as soon as possible for a visit in 1 week.   Specialty: Internal Medicine Why: patient to make own follow up appointment; office closed for the Holiday;To be seen with repeat labs (CBC & BMP).  Consider outpatient ophthalmology consultation.  Contact information: [place] [address] [phone]        Anabel Bene, MD. Schedule an appointment as soon as possible for a visit in 2 weeks.   Specialty: Neurology Why: patient to make own hospital follow-up/new consultation; office closed for the Holiday  Contact information: [place] [facility] [place] [address] [address] [place] Orders (From admission, onward)    Start     Ordered   07/15/20 Portia  At discharge       Question:  To provide the following care/treatments  Answer:  PT   07/15/20 1515           Equipment/Devices:     Durable Medical Equipment  (From admission, onward)         Start     Ordered   07/15/20 1515  DME 3-in-1  Once        07/15/20 1515           Discharge Condition: Improved and stable   Code Status: Full Code Diet recommendation:  Discharge Diet Orders (From admission, onward)    Start     Ordered   07/15/20 0000  Diet - low sodium heart healthy        07/15/20 1515           Discharge Diagnoses:  Principal Problem:   CVA (cerebral vascular accident) ([place]) Active Problems:   Essential hypertension   Hyperlipidemia   Chronic back pain   Blurred vision   Brief Summary: 79 year old female, lives alone, has a Agricultural engineer, past medical history  significant for stroke/TIA on Plavix, essential hypertension, hyperlipidemia, vitamin D deficiency, COPD/asthma, DDD, presented to the ED with sudden onset of blurred vision and lightheadedness which started at around 4 PM on the day of admission.  She felt that she was not able to walk secondary to these 2 issues.  She called her daughter who checked her blood pressure and found it to be quite high with SBP over 200 mmHg.  She reported that the symptoms were similar to when she was last admitted for a stroke.  She did not have any other strokelike symptoms.  She was admitted for further evaluation and management.  Neurology consulted.  Chart review:  March 2021 presented with dizziness and paresthesias, sas admitted for further evaluation of the same, MRI brain was negative that time, seen by neurology with recommendation to continue aspirin and Plavix.  2019 admitted for acute CVA, hypertensive urgency  Assessment and plan:  1. Subacute stroke/infarct with some petechial hemorrhage: Neurology was consulted and assisted with management.  Code stroke CT head: Newly seen 4 mm focus of hyperdensity in the left paramedian pons could be lacunar infarction with petechial blood.  MRI brain: Acute/subacute left pontine hemorrhage.  This could represent a spontaneous hemorrhage (  potentially hypertensive) or hemorrhagic conversion of lacunar infarct with acute blood products limiting evaluation.  Mild associated edema without substantial mass-effect.  No hydrocephalus.  Chronic microvascular ischemic disease and remote right parietal infarct.  Carotid Dopplers: Bilateral, less than 50% stenosis secondary to moderate multifocal atherosclerotic plaque formation.  Vertebral artery: Patent with antegrade flow bilaterally.  2D echo: LVEF 55-60%.  Mild to moderate AI, no aortic stenosis.  A1c: 6.  LDL 106.  As per neurology follow-up, patient presented with transient blurred vision and positional vertigo which have since  resolved, now close to baseline NIIHSS of 0, potential subacute infarct with some petechial hemorrhage which is likely incidental finding as would not cause vertigo or blurred vision, imaging reviewed and patient does have a similar sized left pontine subacute stroke with mild hemorrhage, recommends continued Plavix and cleared for discharge today with outpatient follow-up with neurology.  Therapies evaluated and recommend home health PT and DME as noted above.  Patient supposedly has back surgery in about 2 weeks and neurology is okay for her to have this surgery.  I discussed in detail with patient's daughter who will be able to provide close supervision and assistance upon discharge.  Also recommend follow-up closely with PCP for medication adjustments for better control of her LDL. 2. Blurred vision: Unclear etiology.  As per neurology, not related to incidentally found subacute stroke with hemorrhage.?  Blurred vision and lightheadedness were related to uncontrolled hypertension.  Recommend better control of blood pressures, outpatient follow-up with PCP.  May also consider outpatient ophthalmology consultation. 3. Essential hypertension: As per neurology, no need for permissive hypertension given subacute stroke and minimal hemorrhage.  Continue prior home dose of Toprol-XL and Cozaar-close outpatient follow-up with PCP for evaluation and further dose adjustment for better control as deemed necessary. 4. Hyperlipidemia: Continue prior home dose of statins.  Follow-up with PCP for better control. 5. COPD/asthma: Stable without clinical bronchospasm. 6. Chronic back pain: Continue prior home meds. 7. Dementia: Continue Aricept. 8. Anemia: Likely chronic and stable, unclear etiology,?  Anemia of chronic disease  Consultations:  Neurology  Procedures:  As above   Discharge Instructions  Discharge Instructions    Call MD for:   Complete by: As directed    Recurrent blurred vision or  strokelike symptoms.   Call MD for:  extreme fatigue   Complete by: As directed    Call MD for:  persistant dizziness or light-headedness   Complete by: As directed    Diet - low sodium heart healthy   Complete by: As directed    Increase activity slowly   Complete by: As directed        Medication List    STOP taking these medications   Sleep Aid 25 MG Caps Generic drug: diphenhydrAMINE HCl (Sleep)     TAKE these medications   atorvastatin 80 MG tablet Commonly known as: LIPITOR Take 1 tablet (80 mg total) by mouth daily at 6 PM.   calcium carbonate 1250 (500 Ca) MG chewable tablet Commonly known as: OS-CAL Chew 1 tablet by mouth daily.   clopidogrel 75 MG tablet Commonly known as: PLAVIX Take 1 tablet (75 mg total) by mouth daily.   cyclobenzaprine 5 MG tablet Commonly known as: FLEXERIL Take 1 tablet (5 mg total) by mouth at bedtime.   docusate sodium 100 MG capsule Commonly known as: COLACE Take 100 mg by mouth at bedtime.   donepezil 5 MG tablet Commonly known as: ARICEPT Take 1 tablet (5 mg total)  by mouth at bedtime.   ergocalciferol 1.25 MG (50000 UT) capsule Commonly known as: Drisdol Take 1 capsule (50,000 Units total) by mouth once a week.   gabapentin 100 MG capsule Commonly known as: NEURONTIN Take 100 mg by mouth 3 (three) times daily.   HYDROcodone-acetaminophen 5-325 MG tablet Commonly known as: NORCO/VICODIN Take 2 tablets by mouth 2 (two) times daily as needed for severe pain.   losartan 25 MG tablet Commonly known as: Cozaar Take 1 tablet (25 mg total) by mouth daily.   metoprolol succinate 50 MG 24 hr tablet Commonly known as: TOPROL-XL Take 2 tablets (100 mg total) by mouth daily.   venlafaxine XR 75 MG 24 hr capsule Commonly known as: EFFEXOR-XR TAKE 2 CAPSULES BY MOUTH DAILY. What changed:   how much to take  how to take this  when to take this      Allergies  Allergen Reactions  . Heparin Nausea And Vomiting       Procedures/Studies: MR BRAIN WO CONTRAST  Result Date: 07/15/2020 CLINICAL DATA:  Stroke follow-up. EXAM: MRI HEAD WITHOUT CONTRAST TECHNIQUE: Multiplanar, multiecho pulse sequences of the brain and surrounding structures were obtained without intravenous contrast. COMPARISON:  CT head July 14, 2020. FINDINGS: Brain: Focus of susceptibility artifact and T1 hyperintensity within the left pons, compatible with a focus of hemorrhage when correlating with recent CT head. There is mild associated edema without significant mass effect. Acute blood products limit evaluation for underlying infarct in this region. Otherwise, no evidence of acute infarct. Remote right parietal infarct with encephalomalacia and surrounding gliosis. No hydrocephalus. No mass lesion or midline shift. Scattered T2/FLAIR hyperintensities within the white matter, compatible with chronic microvascular ischemic disease. Generalized cerebral atrophy with ex vacuo ventricular dilation. Vascular: Major arterial flow voids are maintained at the skull base. Skull and upper cervical spine: Normal marrow signal. Sinuses/Orbits: Visualized sinuses are clear.  Unremarkable orbits. Other: No mastoid effusions. IMPRESSION: 1. Acute/subacute left pontine hemorrhage, similar in size when comparing across modalities. This could represent a spontaneous hemorrhage (potentially hypertensive) or hemorrhagic conversion of a lacunar infarct with acute blood products limiting evaluation. Mild associated edema without substantial mass effect. No hydrocephalus. 2. Chronic microvascular ischemic disease and remote right parietal infarct. Electronically Signed   By: Margaretha Sheffield MD   On: 07/15/2020 09:59   US Carotid Bilateral (at [facility] and [facility] only)  Result Date: 07/15/2020 CLINICAL DATA:  79 year old female with history of stroke. EXAM: BILATERAL CAROTID DUPLEX ULTRASOUND TECHNIQUE: Pearline Cables scale imaging, color Doppler and duplex ultrasound were  performed of bilateral carotid and vertebral arteries in the neck. COMPARISON:  12/20/2017 FINDINGS: Criteria: Quantification of carotid stenosis is based on velocity parameters that correlate the residual internal carotid diameter with NASCET-based stenosis levels, using the diameter of the distal internal carotid lumen as the denominator for stenosis measurement. The following velocity measurements were obtained: RIGHT ICA: Peak systolic velocity 95 cm/sec, End diastolic velocity 70 cm/sec CCA: Peak systolic velocity 88 cm/sec SYSTOLIC ICA/CCA RATIO:  1.1 ECA: Peak systolic velocity 062 cm/sec LEFT ICA: Peak systolic velocity 376 cm/sec, End diastolic velocity 16 cm/sec CCA: 283 cm/sec SYSTOLIC ICA/CCA RATIO:  0.8 ECA: 173 cm/sec RIGHT CAROTID ARTERY: Moderate multifocal atherosclerotic plaque formation about the carotid bulb and bifurcation. No significant tortuosity. Low resistance waveforms which demonstrate pulsus bisferiens. RIGHT VERTEBRAL ARTERY:  Antegrade flow. LEFT CAROTID ARTERY: Moderate multifocal atherosclerotic plaque formation in the distal CCA, bulb and bifurcation, and into the ICA. No significant tortuosity. Low resistance waveforms  which demonstrate pulsus bisferiens. LEFT VERTEBRAL ARTERY:  Antegrade flow. Upper extremity non-invasive blood pressures: Not obtained. IMPRESSION: 1. Right carotid artery system: Less than 50% stenosis secondary to moderate multifocal atherosclerotic plaque formation. 2. Left carotid artery system: Less than 50% stenosis secondary to moderate multifocal atherosclerotic plaque formation. 3. Pulsus bisferiens waveform visualized in the bilateral carotid arteries as could be seen with aortic valvulopathy. 4.  Vertebral artery system: Patent with antegrade flow bilaterally. Ruthann Cancer, MD [facility] Electronically Signed   By: Ruthann Cancer MD   On: 07/15/2020 12:13   ECHOCARDIOGRAM COMPLETE  Result  Date: 07/15/2020    ECHOCARDIOGRAM REPORT   Patient Name:   CARA THAXTON Date of Exam: 07/15/2020 Medical Rec #:  [id number]       Height:       66.0 in Accession #:    [id number]      Weight:       210.7 lb Date of Birth:  04/11/1941        BSA:          2.045 m Patient Age:    31 years        BP:           152/47 mmHg Patient Gender: F               HR:           61 bpm. Exam Location:  [facility] Procedure: 2D Echo, Color Doppler and Cardiac Doppler Indications:     Stroke 434.91  History:         Patient has prior history of Echocardiogram examinations, most                  recent 12/20/2017. COPD and Stroke; Risk Factors:Hypertension.  Sonographer:     Sherrie Sport RDCS (AE) Referring Phys:  [place] Diagnosing Phys: Kate Sable MD  Sonographer Comments: No apical window and no subcostal window. Image acquisition challenging due to COPD. IMPRESSIONS  1. Left ventricular ejection fraction, by estimation, is 55 to 60%. The left ventricle has normal function. The left ventricle has no regional wall motion abnormalities. Left ventricular diastolic function could not be evaluated.  2. Right ventricular systolic function is low normal. The right ventricular size is not well visualized.  3. The mitral valve is degenerative. No evidence of mitral valve regurgitation.  4. The aortic valve is calcified. Aortic valve regurgitation is mild to moderate. Mild aortic valve sclerosis is present, with no evidence of aortic valve stenosis. FINDINGS  Left Ventricle: Left ventricular ejection fraction, by estimation, is 55 to 60%. The left ventricle has normal function. The left ventricle has no regional wall motion abnormalities. The left ventricular internal cavity size was normal in size. There is  no left ventricular hypertrophy. Left ventricular diastolic function could not be evaluated. Right Ventricle: The right ventricular size is not well visualized. No increase in right ventricular wall thickness. Right  ventricular systolic function is low normal. Left Atrium: Left atrial size was normal in size. Right Atrium: Right atrial size was not well visualized. Pericardium: There is no evidence of pericardial effusion. Mitral Valve: The mitral valve is degenerative in appearance. No evidence of mitral valve regurgitation. Tricuspid Valve: The tricuspid valve is normal in structure. Tricuspid valve regurgitation is trivial. Aortic Valve: The aortic valve is calcified. Aortic valve regurgitation is mild to moderate. Mild aortic valve sclerosis is present, with no evidence of aortic  valve stenosis. Pulmonic Valve: The pulmonic valve was not well visualized. Pulmonic valve regurgitation is not visualized. Aorta: The aortic root is normal in size and structure. Venous: The inferior vena cava was not well visualized. IAS/Shunts: No atrial level shunt detected by color flow Doppler.  LEFT VENTRICLE PLAX 2D LVIDd:         4.62 cm LVIDs:         3.35 cm LV PW:         1.50 cm LV IVS:        1.31 cm LVOT diam:     2.10 cm LVOT Area:     3.46 cm  LEFT ATRIUM         Index LA diam:    3.80 cm 1.86 cm/m                        PULMONIC VALVE AORTA                 PV Vmax:        0.55 m/s Ao Root diam: 2.70 cm PV Peak grad:   1.2 mmHg                       RVOT Peak grad: 2 mmHg   SHUNTS Systemic Diam: 2.10 cm Kate Sable MD Electronically signed by Kate Sable MD Signature Date/Time: 07/15/2020/2:27:18 PM    Final    CUP PACEART REMOTE DEVICE CHECK  Result Date: 07/06/2020 ILR summary report received. Battery status OK. Normal device function. No new symptom, tachy, brady, or pause episodes. No new AF episodes. Monthly summary reports and ROV/PRN. HB  CT HEAD CODE STROKE WO CONTRAST  Result Date: 07/14/2020 CLINICAL DATA:  Code stroke. Episodic weakness today. Hypertensive. Blurred vision. EXAM: CT HEAD WITHOUT CONTRAST TECHNIQUE: Contiguous axial images were obtained from the base of the skull through the vertex  without intravenous contrast. COMPARISON:  11/06/2019 FINDINGS: Brain: Newly seen 4 mm focus of hyperdensity in the left para median pons could be a lacunar infarction with petechial blood. No focal cerebellar finding. Cerebral hemispheres show chronic small-vessel disease of the white matter and an old infarction in the right parietal cortical and subcortical brain. No acute finding in the cerebral hemispheres. No mass, hydrocephalus or extra-axial collection. Vascular: There is atherosclerotic calcification of the major vessels at the base of the brain. Skull: Negative Sinuses/Orbits: Clear/normal Other: None ASPECTS ([place] Stroke Program Early CT Score) - Ganglionic level infarction (caudate, lentiform nuclei, internal capsule, insula, M1-M3 cortex): 7 - Supraganglionic infarction (M4-M6 cortex): 3 Total score (0-10 with 10 being normal): 10 IMPRESSION: 1. Newly seen 4 mm focus of hyperdensity in the left para median pons could be a lacunar infarction with petechial blood. 2. ASPECTS is 10. 3. Chronic small-vessel disease of the white matter. Old right parietal cortical and subcortical infarction. 4. These results were called by telephone at the time of interpretation on 07/14/2020 at 5:29 pm to provider Lavonia Drafts , who verbally acknowledged these results. Electronically Signed   By: Nelson Chimes M.D.   On: 07/14/2020 17:31      Subjective: Patient interviewed and examined along with her daughter at bedside.  Reports feeling better.  States that her blurred vision has resolved and vision is back to baseline.  Denies diplopia.  No headache reported.  No lightheadedness.  Denies any other focal neurological symptoms  Discharge Exam:  Vitals:   07/15/20 0501 07/15/20 1102  07/15/20 1200 07/15/20 1216  BP: (!) 152/47 (!) 189/65  (!) 184/52  Pulse: 61 64  65  Resp: 16   20  Temp: 98 F (36.7 C) 98 F (36.7 C) 98.2 F (36.8 C) 98.2 F (36.8 C)  TempSrc:  Oral Oral Oral  SpO2: 97% 100%  100%   Weight:      Height:        General: Pleasant elderly female, moderately built and obese lying comfortably propped up in bed without distress. Cardiovascular: S1 & S2 heard, RRR, S1/S2 +. No murmurs, rubs, gallops or clicks. No JVD or pedal edema.  Telemetry personally reviewed: Sinus rhythm. Respiratory: Clear to auscultation without wheezing, rhonchi or crackles. No increased work of breathing. Abdominal:  Non distended, non tender & soft. No organomegaly or masses appreciated. Normal bowel sounds heard. CNS: Alert and oriented. No focal deficits. Extremities: no edema, no cyanosis    The results of significant diagnostics from this hospitalization (including imaging, microbiology, ancillary and laboratory) are listed below for reference.     Microbiology: Recent Results (from the past 240 hour(s))  Resp Panel by RT-PCR (Flu A&B, Covid) Nasopharyngeal Swab     Status: None   Collection Time: 07/14/20  6:53 PM   Specimen: Nasopharyngeal Swab; Nasopharyngeal(NP) swabs in vial transport medium  Result Value Ref Range Status   SARS Coronavirus 2 by RT PCR NEGATIVE NEGATIVE Final    Comment: (NOTE) SARS-CoV-2 target nucleic acids are NOT DETECTED.  The SARS-CoV-2 RNA is generally detectable in upper respiratory specimens during the acute phase of infection. The lowest concentration of SARS-CoV-2 viral copies this assay can detect is 138 copies/mL. A negative result does not preclude SARS-Cov-2 infection and should not be used as the sole basis for treatment or other patient management decisions. A negative result may occur with  improper specimen collection/handling, submission of specimen other than nasopharyngeal swab, presence of viral mutation(s) within the areas targeted by this assay, and inadequate number of viral copies(<138 copies/mL). A negative result must be combined with clinical observations, patient history, and epidemiological information. The expected result is  Negative.  Fact Sheet for Patients:  EntrepreneurPulse.com.au  Fact Sheet for Healthcare Providers:  IncredibleEmployment.be  This test is no t yet approved or cleared by the [place] FDA and  has been authorized for detection and/or diagnosis of SARS-CoV-2 by FDA under an Emergency Use Authorization (EUA). This EUA will remain  in effect (meaning this test can be used) for the duration of the COVID-19 declaration under Section 564(b)(1) of the Act, 21 U.S.C.section 360bbb-3(b)(1), unless the authorization is terminated  or revoked sooner.       Influenza A by PCR NEGATIVE NEGATIVE Final   Influenza B by PCR NEGATIVE NEGATIVE Final    Comment: (NOTE) The Xpert Xpress SARS-CoV-2/FLU/RSV plus assay is intended as an aid in the diagnosis of influenza from Nasopharyngeal swab specimens and should not be used as a sole basis for treatment. Nasal washings and aspirates are unacceptable for Xpert Xpress SARS-CoV-2/FLU/RSV testing.  Fact Sheet for Patients: EntrepreneurPulse.com.au  Fact Sheet for Healthcare Providers: IncredibleEmployment.be  This test is not yet approved or cleared by the [place] FDA and has been authorized for detection and/or diagnosis of SARS-CoV-2 by FDA under an Emergency Use Authorization (EUA). This EUA will remain in effect (meaning this test can be used) for the duration of the COVID-19 declaration under Section 564(b)(1) of the Act, 21 U.S.C. section 360bbb-3(b)(1), unless the authorization is terminated or revoked.  Performed at [facility], [address] [place], [place] [address]      Labs: CBC: Recent Labs  Lab 07/14/20 1731  WBC 8.5  NEUTROABS 6.0  HGB 11.9*  HCT 37.0  MCV 94.9  PLT 384    Basic Metabolic Panel: Recent Labs  Lab 07/14/20 1731  NA 143  K 4.0  CL 103  CO2 30  GLUCOSE 106*  BUN 16  CREATININE 0.89  CALCIUM 9.3     Liver Function Tests: Recent Labs  Lab 07/14/20 1731  AST 20  ALT 13  ALKPHOS 65  BILITOT 0.4  PROT 7.4  ALBUMIN 4.3    CBG: No results for input(s): GLUCAP in the last 168 hours.  Hgb A1c Recent Labs    07/15/20 0437  HGBA1C 6.0*    Lipid Profile Recent Labs    07/15/20 0437  CHOL 186  HDL 50  LDLCALC 106*  TRIG 148  CHOLHDL 3.7   I discussed in detail with patient's daughter at bedside, updated care and answered questions.   Time coordinating discharge: 35 minutes  SIGNED:  Vernell Leep, MD, [place], [facility]. [facility]  To contact the attending provider between 7A-7P or the covering provider during after hours 7P-7A, please log into the web site [email or web address] and access using universal [place] password for that web site. If you do not have the password, please call the hospital operator.

## 2020-07-15 NOTE — Progress Notes (Signed)
PT Cancellation Note  Patient Details Name: [name] Douglas MRN: [id number] DOB: [birth year]   Cancelled Treatment:    Reason Eval/Treat Not Completed: Patient at procedure or test/unavailable (Consult received and chart reviewed. Patient currently off unit for diagnostic imaging; will re-attempt at later time/date as medically appropriate and available.)   Emigdio Wildeman H. Owens Shark, PT, DPT, NCS 07/15/20, 8:37 AM [phone]

## 2020-07-18 DIAGNOSIS — I69398 Other sequelae of cerebral infarction: Secondary | ICD-10-CM | POA: Insufficient documentation

## 2020-07-19 ENCOUNTER — Other Ambulatory Visit: Payer: Self-pay

## 2020-07-19 ENCOUNTER — Encounter (HOSPITAL_COMMUNITY): Payer: Self-pay | Admitting: Urgent Care

## 2020-07-19 ENCOUNTER — Telehealth: Payer: Self-pay | Admitting: *Deleted

## 2020-07-19 ENCOUNTER — Encounter
Admission: RE | Admit: 2020-07-19 | Discharge: 2020-07-19 | Disposition: A | Discharge status: Home / Self Care | Payer: Medicare Other | Source: Ambulatory Visit | Attending: Neurosurgery | Admitting: Neurosurgery

## 2020-07-19 DIAGNOSIS — Z01812 Encounter for preprocedural laboratory examination: Secondary | ICD-10-CM | POA: Diagnosis not present

## 2020-07-19 HISTORY — DX: Other amnesia: R41.3

## 2020-07-19 HISTORY — DX: Peripheral vascular disease, unspecified: I73.9

## 2020-07-19 LAB — TYPE AND SCREEN
ABO/RH(D): A POS
Antibody Screen: NEGATIVE

## 2020-07-19 LAB — GLUCOSE, CAPILLARY: Glucose-Capillary: 113 mg/dL — ABNORMAL HIGH (ref 70–99)

## 2020-07-19 LAB — PROTIME-INR
INR: 1 (ref 0.8–1.2)
Prothrombin Time: 12.8 seconds (ref 11.4–15.2)

## 2020-07-19 LAB — SURGICAL PCR SCREEN
MRSA, PCR: NEGATIVE
Staphylococcus aureus: NEGATIVE

## 2020-07-19 LAB — APTT: aPTT: 39 seconds — ABNORMAL HIGH (ref 24–36)

## 2020-07-19 NOTE — Telephone Encounter (Signed)
Pre-op call back Check with Dr. Jonathon Jordan office before making appt.  She is on ASA and plavix for strokes.  Thanks.

## 2020-07-19 NOTE — Patient Instructions (Addendum)
Your procedure is scheduled on: Mon 12/6 Report to Registration Desk in the [facility]   Then to Day Surgery. To find out your arrival time please call [phone] between 1PM - 3PM on Friday 12/3.  Remember: Instructions that are not followed completely may result in serious medical risk,  up to and including death, or upon the discretion of your surgeon and anesthesiologist your  surgery may need to be rescheduled.     _X__ 1. Do not eat food after midnight the night before your procedure.                 No chewing gum or hard candies. You may drink clear liquids up to 2 hours                 before you are scheduled to arrive for your surgery- DO not drink clear                 liquids within 2 hours of the start of your surgery.                 Clear Liquids include:  water, apple juice without pulp, clear Gatorade, G2 or                  Gatorade Zero (avoid Red/Purple/Blue), Black Coffee or Tea (Do not add                 anything to coffee or tea). _____2.   Complete the "Ensure Clear Pre-surgery Clear Carbohydrate Drink" provided to you, 2 hours before arrival.   __X__2.  On the morning of surgery brush your teeth with toothpaste and water, you                may rinse your mouth with mouthwash if you wish.  Do not swallow any toothpaste of mouthwash.     ___ 3.  No Alcohol for 24 hours before or after surgery.   _X__ 4.  Do Not Smoke or use e-cigarettes For 24 Hours Prior to Your Surgery.                 Do not use any chewable tobacco products for at least 6 hours prior to                 Surgery.  ___  5.  Do not use any recreational drugs (marijuana, cocaine, heroin, ecstasy, MDMA or other)                For at least one week prior to your surgery.  Combination of these drugs with anesthesia                May have life threatening results.  ____  6.  Bring all medications with you on the day of surgery if instructed.   __x__  7.  Notify your  doctor if there is any change in your medical condition      (cold, fever, infections).     Do not wear jewelry, make-up, hairpins, clips or nail polish. Do not wear lotions, powders, or perfumes. You may wear deodorant. Do not shave 48 hours prior to surgery. Do not bring valuables to the hospital.    [facility] is not responsible for any belongings or valuables.  Contacts, dentures or bridgework may not be worn into surgery. Leave your suitcase in the car. After surgery it may be brought to your room. For patients admitted to  the hospital, discharge time is determined by your treatment team.   Patients discharged the day of surgery will not be allowed to drive home.   Make arrangements for someone to be with you for the first 24 hours of your Same Day Discharge.    Please read over the following fact sheets that you were given:    _x___ Take these medicines the morning of surgery with A SIP OF WATER:    1. gabapentin (NEURONTIN) 100 MG capsule  2. HYDROcodone-acetaminophen (NORCO/VICODIN) 5-325 MG tablet if needed  3. metoprolol succinate (TOPROL-XL) 50 MG 24 hr tablet  4.venlafaxine XR (EFFEXOR-XR) 75 MG 24 hr capsule  5.  6.  ____ Fleet Enema (as directed)   _x___ Use CHG Soap (or wipes) as directed  ____ Use Benzoyl Peroxide Gel as instructed  ____ Use inhalers on the day of surgery  ____ Stop metformin 2 days prior to surgery    ____ Take 1/2 of usual insulin dose the night before surgery. No insulin the morning          of surgery.   _x___ Stopped  Plavix  On 11/28                Stopped aspirin on on 11/28  __x__ Stop Anti-inflammatories on no ibuprofen or aleve may take tylenol or norco   ____ Stop supplements until after surgery.    ____ Bring C-Pap to the hospital.    If you have any questions regarding your pre-procedure instructions,  Please call Pre-admit Testing at [place]

## 2020-07-19 NOTE — Telephone Encounter (Signed)
See prior note

## 2020-07-19 NOTE — Telephone Encounter (Signed)
    Primary Cardiologist: No primary care provider on file.  Chart reviewed as part of pre-operative protocol coverage. Patient was contacted 07/19/2020 in reference to pre-operative risk assessment for pending surgery as outlined below.  AIMY SWEETING was last seen on 2019 by Dr. Fletcher Anon post CVA for possible atrial fib.  Upon his recommendation Dr. Caryl Comes placed a loop recorder to monitor for atrial fib. We have not seen in the office since.   She has had strokes since that time.     Most recent CVA 07/14/20  With hospitalization and pt is to see Dr. Gurney Maxin (neurology) for that follow up.  Dr. Melrose Nakayama would need to clear for surgery and give recommendations for asprin and plavix.   We will be glad to have her seen by cardiology but no hx of CAD.  Please let us know how we can help.    I will route this recommendation to the requesting party via Epic fax function and remove from pre-op pool. Please call with questions.  [name], NP 07/19/2020, 5:22 PM

## 2020-07-19 NOTE — Telephone Encounter (Signed)
   Primary Cardiologist: No primary care provider on file.  Chart reviewed as part of pre-operative protocol coverage. Because of [name] Douglas past medical history and time since last visit, she will require a follow-up visit in order to better assess preoperative cardiovascular risk.  Pre-op covering staff: - Please schedule appointment and call patient to inform them. If patient already had an upcoming appointment within acceptable timeframe, please add "pre-op clearance" to the appointment notes so provider is aware. - Please contact requesting surgeon's office via preferred method (i.e, phone, fax) to inform them of need for appointment prior to surgery.  I have notified them that we have not seen pt since 2019 and she has had recent CVA.  Neurology has her on asprin and plavix. Not cardiology they would need recommendations from neurology.     If applicable, this message will also be routed to pharmacy pool and/or primary cardiologist for input on holding anticoagulant/antiplatelet agent as requested below so that this information is available to the clearing provider at time of patient's appointment.   [name], NP  07/19/2020, 5:19 PM

## 2020-07-19 NOTE — Telephone Encounter (Signed)
   [facility] Pre-operative Risk Assessment    HEARTCARE STAFF: - Please ensure there is not already an duplicate clearance open for this procedure. - Under Visit Info/Reason for Call, type in Other and utilize the format Clearance MM/DD/YY or Clearance TBD. Do not use dashes or single digits. - If request is for dental extraction, please clarify the # of teeth to be extracted.  Request for surgical clearance:  1. What type of surgery is being performed? RIGHT L2-3 HEMILAMINECTOMY; L3-5 LAMINECTOMY    2. When is this surgery scheduled? 07/25/20   3. What type of clearance is required (medical clearance vs. Pharmacy clearance to hold med vs. Both)? MEDICAL  4. Are there any medications that need to be held prior to surgery and how long? ASA AND PLAVIX   5. Practice name and name of physician performing surgery? DR. Remo Lipps [facility]   6. What is the office phone number? [phone]   7.   What is the office fax number? [phone]  8.   Anesthesia type (None, local, MAC, general) ? GENERAL   [name] Douglas 07/19/2020, 4:36 PM  _________________________________________________________________   (provider comments below)

## 2020-07-20 NOTE — Telephone Encounter (Signed)
Addendum: numbers on the clearance form are not correct. I did confirm this while on the phone this morning.  Phone # [phone] Fax# [phone]

## 2020-07-20 NOTE — Telephone Encounter (Signed)
I dialed the number on the clearance form [phone] to s/w Dr. Jonathon Jordan office in regards to ASA and Plavix to be held for surgery. Clearance for ASA and Plavix with need to be obtained by Dr. Gurney Maxin (Neurology). Pt is on  for h/o CVA. Pt was last seen by Dr. Fletcher Anon in our office 12/2017. Per Pre Op team we will be happy to see the pt if needed, though pt has no h/o CAD. I assured that I will send notes to Dr. Lacinda Axon this morning with recommendations. I will remove from the pre op call back pool. If the surgeon still requires cardiac clearance from our office the pt will then need to be scheduled.

## 2020-07-21 ENCOUNTER — Other Ambulatory Visit: Payer: Medicare Other

## 2020-07-21 ENCOUNTER — Telehealth: Payer: Self-pay

## 2020-07-21 NOTE — Telephone Encounter (Signed)
Gave verbal order for ok to [facility] health to start Physical therapy on dec 7( [phone] )order for home health given from hospital

## 2020-07-25 ENCOUNTER — Encounter: Admission: RE | Payer: Self-pay | Source: Home / Self Care

## 2020-07-25 ENCOUNTER — Ambulatory Visit: Admission: RE | Admit: 2020-07-25 | Payer: Medicare Other | Source: Home / Self Care | Admitting: Neurosurgery

## 2020-07-25 SURGERY — LUMBAR LAMINECTOMY/DECOMPRESSION MICRODISCECTOMY 3 LEVELS
Anesthesia: General

## 2020-07-26 ENCOUNTER — Other Ambulatory Visit: Payer: Self-pay

## 2020-07-26 DIAGNOSIS — E785 Hyperlipidemia, unspecified: Secondary | ICD-10-CM | POA: Diagnosis not present

## 2020-07-26 DIAGNOSIS — Z9981 Dependence on supplemental oxygen: Secondary | ICD-10-CM | POA: Diagnosis not present

## 2020-07-26 DIAGNOSIS — G8929 Other chronic pain: Secondary | ICD-10-CM | POA: Insufficient documentation

## 2020-07-26 DIAGNOSIS — J449 Chronic obstructive pulmonary disease, unspecified: Secondary | ICD-10-CM | POA: Diagnosis not present

## 2020-07-26 DIAGNOSIS — I10 Essential (primary) hypertension: Secondary | ICD-10-CM | POA: Diagnosis not present

## 2020-07-26 DIAGNOSIS — R42 Dizziness and giddiness: Secondary | ICD-10-CM | POA: Diagnosis not present

## 2020-07-26 DIAGNOSIS — E559 Vitamin D deficiency, unspecified: Secondary | ICD-10-CM

## 2020-07-26 DIAGNOSIS — F17210 Nicotine dependence, cigarettes, uncomplicated: Secondary | ICD-10-CM | POA: Diagnosis not present

## 2020-07-26 DIAGNOSIS — F419 Anxiety disorder, unspecified: Secondary | ICD-10-CM | POA: Diagnosis not present

## 2020-07-26 DIAGNOSIS — E039 Hypothyroidism, unspecified: Secondary | ICD-10-CM | POA: Diagnosis not present

## 2020-07-26 DIAGNOSIS — I69398 Other sequelae of cerebral infarction: Secondary | ICD-10-CM | POA: Diagnosis not present

## 2020-07-26 DIAGNOSIS — I639 Cerebral infarction, unspecified: Secondary | ICD-10-CM | POA: Diagnosis not present

## 2020-07-26 DIAGNOSIS — M545 Low back pain, unspecified: Secondary | ICD-10-CM | POA: Diagnosis not present

## 2020-07-26 DIAGNOSIS — Z96653 Presence of artificial knee joint, bilateral: Secondary | ICD-10-CM | POA: Diagnosis not present

## 2020-07-26 MED ORDER — ERGOCALCIFEROL 1.25 MG (50000 UT) PO CAPS: 50000.0000 [IU] | ORAL_CAPSULE | ORAL | 3 refills | Status: DC

## 2020-07-26 MED ORDER — ERGOCALCIFEROL 1.25 MG (50000 UT) PO CAPS: 50000.0000 [IU] | ORAL_CAPSULE | ORAL | 0 refills | Status: DC

## 2020-08-02 DIAGNOSIS — I10 Essential (primary) hypertension: Secondary | ICD-10-CM | POA: Diagnosis not present

## 2020-08-02 DIAGNOSIS — M545 Low back pain, unspecified: Secondary | ICD-10-CM | POA: Diagnosis not present

## 2020-08-02 DIAGNOSIS — R42 Dizziness and giddiness: Secondary | ICD-10-CM | POA: Diagnosis not present

## 2020-08-02 DIAGNOSIS — G8929 Other chronic pain: Secondary | ICD-10-CM | POA: Diagnosis not present

## 2020-08-02 DIAGNOSIS — I69398 Other sequelae of cerebral infarction: Secondary | ICD-10-CM | POA: Diagnosis not present

## 2020-08-02 DIAGNOSIS — J449 Chronic obstructive pulmonary disease, unspecified: Secondary | ICD-10-CM | POA: Diagnosis not present

## 2020-08-03 ENCOUNTER — Observation Stay: Payer: Medicare Other

## 2020-08-03 ENCOUNTER — Telehealth: Payer: Self-pay | Admitting: Student

## 2020-08-03 ENCOUNTER — Emergency Department: Payer: Medicare Other

## 2020-08-03 ENCOUNTER — Observation Stay
Admission: EM | Admit: 2020-08-03 | Discharge: 2020-08-05 | Disposition: A | Discharge status: Home / Self Care | Payer: Medicare Other | Attending: Internal Medicine | Admitting: Internal Medicine

## 2020-08-03 ENCOUNTER — Other Ambulatory Visit: Payer: Self-pay

## 2020-08-03 DIAGNOSIS — F17210 Nicotine dependence, cigarettes, uncomplicated: Secondary | ICD-10-CM | POA: Diagnosis not present

## 2020-08-03 DIAGNOSIS — W19XXXA Unspecified fall, initial encounter: Secondary | ICD-10-CM | POA: Diagnosis not present

## 2020-08-03 DIAGNOSIS — M533 Sacrococcygeal disorders, not elsewhere classified: Secondary | ICD-10-CM | POA: Diagnosis not present

## 2020-08-03 DIAGNOSIS — Z96653 Presence of artificial knee joint, bilateral: Secondary | ICD-10-CM | POA: Insufficient documentation

## 2020-08-03 DIAGNOSIS — J449 Chronic obstructive pulmonary disease, unspecified: Secondary | ICD-10-CM | POA: Insufficient documentation

## 2020-08-03 DIAGNOSIS — Z9861 Coronary angioplasty status: Secondary | ICD-10-CM | POA: Diagnosis not present

## 2020-08-03 DIAGNOSIS — Z8673 Personal history of transient ischemic attack (TIA), and cerebral infarction without residual deficits: Secondary | ICD-10-CM | POA: Diagnosis not present

## 2020-08-03 DIAGNOSIS — R102 Pelvic and perineal pain: Secondary | ICD-10-CM

## 2020-08-03 DIAGNOSIS — J45909 Unspecified asthma, uncomplicated: Secondary | ICD-10-CM | POA: Insufficient documentation

## 2020-08-03 DIAGNOSIS — I10 Essential (primary) hypertension: Secondary | ICD-10-CM | POA: Diagnosis not present

## 2020-08-03 DIAGNOSIS — Z79899 Other long term (current) drug therapy: Secondary | ICD-10-CM | POA: Insufficient documentation

## 2020-08-03 DIAGNOSIS — Z7982 Long term (current) use of aspirin: Secondary | ICD-10-CM | POA: Diagnosis not present

## 2020-08-03 DIAGNOSIS — G8929 Other chronic pain: Secondary | ICD-10-CM | POA: Insufficient documentation

## 2020-08-03 DIAGNOSIS — M25551 Pain in right hip: Secondary | ICD-10-CM | POA: Insufficient documentation

## 2020-08-03 DIAGNOSIS — M5441 Lumbago with sciatica, right side: Principal | ICD-10-CM

## 2020-08-03 DIAGNOSIS — Z7902 Long term (current) use of antithrombotics/antiplatelets: Secondary | ICD-10-CM | POA: Diagnosis not present

## 2020-08-03 DIAGNOSIS — Z20822 Contact with and (suspected) exposure to covid-19: Secondary | ICD-10-CM | POA: Diagnosis not present

## 2020-08-03 DIAGNOSIS — E039 Hypothyroidism, unspecified: Secondary | ICD-10-CM | POA: Diagnosis not present

## 2020-08-03 DIAGNOSIS — W108XXA Fall (on) (from) other stairs and steps, initial encounter: Secondary | ICD-10-CM | POA: Insufficient documentation

## 2020-08-03 DIAGNOSIS — M5459 Other low back pain: Secondary | ICD-10-CM | POA: Diagnosis present

## 2020-08-03 DIAGNOSIS — R519 Headache, unspecified: Secondary | ICD-10-CM | POA: Diagnosis not present

## 2020-08-03 DIAGNOSIS — I619 Nontraumatic intracerebral hemorrhage, unspecified: Secondary | ICD-10-CM | POA: Insufficient documentation

## 2020-08-03 DIAGNOSIS — W1839XA Other fall on same level, initial encounter: Secondary | ICD-10-CM | POA: Diagnosis not present

## 2020-08-03 DIAGNOSIS — M546 Pain in thoracic spine: Secondary | ICD-10-CM | POA: Diagnosis not present

## 2020-08-03 DIAGNOSIS — M549 Dorsalgia, unspecified: Secondary | ICD-10-CM | POA: Diagnosis not present

## 2020-08-03 DIAGNOSIS — M545 Low back pain, unspecified: Secondary | ICD-10-CM | POA: Diagnosis not present

## 2020-08-03 DIAGNOSIS — K5730 Diverticulosis of large intestine without perforation or abscess without bleeding: Secondary | ICD-10-CM | POA: Diagnosis not present

## 2020-08-03 LAB — RESP PANEL BY RT-PCR (FLU A&B, COVID) ARPGX2
Influenza A by PCR: NEGATIVE
Influenza B by PCR: NEGATIVE
SARS Coronavirus 2 by RT PCR: NEGATIVE

## 2020-08-03 LAB — COMPREHENSIVE METABOLIC PANEL
ALT: 11 U/L (ref 0–44)
AST: 16 U/L (ref 15–41)
Albumin: 4.1 g/dL (ref 3.5–5.0)
Alkaline Phosphatase: 56 U/L (ref 38–126)
Anion gap: 11 (ref 5–15)
BUN: 14 mg/dL (ref 8–23)
CO2: 26 mmol/L (ref 22–32)
Calcium: 9 mg/dL (ref 8.9–10.3)
Chloride: 104 mmol/L (ref 98–111)
Creatinine, Ser: 0.66 mg/dL (ref 0.44–1.00)
GFR, Estimated: >60 mL/min (ref >60)
Glucose, Bld: 95 mg/dL (ref 70–99)
Potassium: 3.7 mmol/L (ref 3.5–5.1)
Sodium: 141 mmol/L (ref 135–145)
Total Bilirubin: 0.6 mg/dL (ref 0.3–1.2)
Total Protein: 6.7 g/dL (ref 6.5–8.1)

## 2020-08-03 LAB — CBC WITH DIFFERENTIAL/PLATELET
Abs Immature Granulocytes: 0.03 10*3/uL (ref 0.00–0.07)
Basophils Absolute: 0.1 10*3/uL (ref 0.0–0.1)
Basophils Relative: 1 %
Eosinophils Absolute: 0.3 10*3/uL (ref 0.0–0.5)
Eosinophils Relative: 4 %
HCT: 35.2 % — ABNORMAL LOW (ref 36.0–46.0)
Hemoglobin: 11.7 g/dL — ABNORMAL LOW (ref 12.0–15.0)
Immature Granulocytes: 0 %
Lymphocytes Relative: 25 %
Lymphs Abs: 1.9 10*3/uL (ref 0.7–4.0)
MCH: 30.8 pg (ref 26.0–34.0)
MCHC: 33.2 g/dL (ref 30.0–36.0)
MCV: 92.6 fL (ref 80.0–100.0)
Monocytes Absolute: 0.5 10*3/uL (ref 0.1–1.0)
Monocytes Relative: 7 %
Neutro Abs: 4.9 10*3/uL (ref 1.7–7.7)
Neutrophils Relative %: 63 %
Platelets: 252 10*3/uL (ref 150–400)
RBC: 3.8 MIL/uL — ABNORMAL LOW (ref 3.87–5.11)
RDW: 13.1 % (ref 11.5–15.5)
WBC: 7.7 10*3/uL (ref 4.0–10.5)
nRBC: 0 % (ref 0.0–0.2)

## 2020-08-03 MED ORDER — HYDROCODONE-ACETAMINOPHEN 5-325 MG PO TABS: 1.0000 | ORAL_TABLET | ORAL | Status: DC | PRN

## 2020-08-03 MED ORDER — LEVOTHYROXINE SODIUM 50 MCG PO TABS: 75.0000 ug | ORAL_TABLET | Freq: Every day | ORAL | Status: DC

## 2020-08-03 MED ORDER — ACETAMINOPHEN 650 MG RE SUPP: 650.0000 mg | Freq: Four times a day (QID) | RECTAL | Status: DC | PRN

## 2020-08-03 MED ORDER — TRAZODONE HCL 50 MG PO TABS: 25.0000 mg | ORAL_TABLET | Freq: Every evening | ORAL | Status: DC | PRN

## 2020-08-03 MED ORDER — CLOPIDOGREL BISULFATE 75 MG PO TABS
75.0000 mg | ORAL_TABLET | Freq: Every day | ORAL | Status: DC
  Administered 2020-08-04 – 2020-08-05 (×2): 75 mg via ORAL
  Filled 2020-08-03 (×2): qty 1

## 2020-08-03 MED ORDER — IPRATROPIUM BROMIDE 0.02 % IN SOLN: 0.5000 mg | Freq: Four times a day (QID) | RESPIRATORY_TRACT | Status: DC | PRN

## 2020-08-03 MED ORDER — SENNOSIDES-DOCUSATE SODIUM 8.6-50 MG PO TABS: 1.0000 | ORAL_TABLET | Freq: Every evening | ORAL | Status: DC | PRN

## 2020-08-03 MED ORDER — ALBUTEROL SULFATE (2.5 MG/3ML) 0.083% IN NEBU: 2.5000 mg | INHALATION_SOLUTION | Freq: Four times a day (QID) | RESPIRATORY_TRACT | Status: DC | PRN

## 2020-08-03 MED ORDER — VENLAFAXINE HCL ER 150 MG PO CP24
150.0000 mg | ORAL_CAPSULE | Freq: Every day | ORAL | Status: DC
  Administered 2020-08-04 – 2020-08-05 (×2): 150 mg via ORAL
  Filled 2020-08-03: qty 2
  Filled 2020-08-03 (×2): qty 1
  Filled 2020-08-03: qty 2

## 2020-08-03 MED ORDER — METOPROLOL SUCCINATE ER 50 MG PO TB24
100.0000 mg | ORAL_TABLET | Freq: Every day | ORAL | Status: DC
Start: 2020-08-04 — End: 2020-08-05
  Administered 2020-08-04 – 2020-08-05 (×2): 100 mg via ORAL
  Filled 2020-08-03 (×2): qty 2

## 2020-08-03 MED ORDER — DONEPEZIL HCL 5 MG PO TABS
5.0000 mg | ORAL_TABLET | Freq: Every day | ORAL | Status: DC
  Administered 2020-08-03 – 2020-08-04 (×2): 5 mg via ORAL
  Filled 2020-08-03 (×3): qty 1

## 2020-08-03 MED ORDER — DOCUSATE SODIUM 100 MG PO CAPS: 100.0000 mg | ORAL_CAPSULE | Freq: Every day | ORAL | Status: DC | PRN

## 2020-08-03 MED ORDER — ATORVASTATIN CALCIUM 20 MG PO TABS
80.0000 mg | ORAL_TABLET | Freq: Every day | ORAL | Status: DC
  Administered 2020-08-04: 80 mg via ORAL
  Filled 2020-08-03: qty 4

## 2020-08-03 MED ORDER — ASPIRIN EC 81 MG PO TBEC
81.0000 mg | DELAYED_RELEASE_TABLET | Freq: Every day | ORAL | Status: DC
  Administered 2020-08-04 – 2020-08-05 (×2): 81 mg via ORAL
  Filled 2020-08-03 (×2): qty 1

## 2020-08-03 MED ORDER — MAGNESIUM CITRATE PO SOLN
1.0000 | Freq: Once | ORAL | Status: DC | PRN
  Filled 2020-08-03: qty 296

## 2020-08-03 MED ORDER — MORPHINE SULFATE (PF) 2 MG/ML IV SOLN: 1.0000 mg | Freq: Four times a day (QID) | INTRAVENOUS | Status: DC | PRN

## 2020-08-03 MED ORDER — LOSARTAN POTASSIUM 25 MG PO TABS
25.0000 mg | ORAL_TABLET | Freq: Every day | ORAL | Status: DC
  Administered 2020-08-03 – 2020-08-05 (×3): 25 mg via ORAL
  Filled 2020-08-03 (×3): qty 1

## 2020-08-03 MED ORDER — BISACODYL 5 MG PO TBEC: 5.0000 mg | DELAYED_RELEASE_TABLET | Freq: Every day | ORAL | Status: DC | PRN

## 2020-08-03 MED ORDER — CYCLOBENZAPRINE HCL 10 MG PO TABS
5.0000 mg | ORAL_TABLET | Freq: Every day | ORAL | Status: DC
Start: 2020-08-03 — End: 2020-08-03

## 2020-08-03 MED ORDER — DOCUSATE SODIUM 100 MG PO CAPS
100.0000 mg | ORAL_CAPSULE | Freq: Two times a day (BID) | ORAL | Status: DC
  Administered 2020-08-04 – 2020-08-05 (×4): 100 mg via ORAL
  Filled 2020-08-03 (×4): qty 1

## 2020-08-03 MED ORDER — HYDROCODONE-ACETAMINOPHEN 5-325 MG PO TABS
1.0000 | ORAL_TABLET | Freq: Four times a day (QID) | ORAL | Status: DC
  Administered 2020-08-04 – 2020-08-05 (×6): 2 via ORAL
  Filled 2020-08-03 (×6): qty 2

## 2020-08-03 MED ORDER — LIDOCAINE 5 % EX PTCH
3.0000 | MEDICATED_PATCH | CUTANEOUS | Status: DC
  Administered 2020-08-04: 3 via TRANSDERMAL
  Filled 2020-08-03 (×2): qty 3

## 2020-08-03 MED ORDER — ONDANSETRON HCL 4 MG PO TABS: 4.0000 mg | ORAL_TABLET | Freq: Four times a day (QID) | ORAL | Status: DC | PRN

## 2020-08-03 MED ORDER — CYCLOBENZAPRINE HCL 10 MG PO TABS
5.0000 mg | ORAL_TABLET | Freq: Three times a day (TID) | ORAL | Status: DC
  Administered 2020-08-04 – 2020-08-05 (×5): 5 mg via ORAL
  Filled 2020-08-03 (×5): qty 1

## 2020-08-03 MED ORDER — GABAPENTIN 600 MG PO TABS
300.0000 mg | ORAL_TABLET | Freq: Once | ORAL | Status: AC
  Administered 2020-08-03: 300 mg via ORAL
  Filled 2020-08-03: qty 1

## 2020-08-03 MED ORDER — HYDROMORPHONE HCL 1 MG/ML IJ SOLN
0.5000 mg | Freq: Once | INTRAMUSCULAR | Status: AC
  Administered 2020-08-03: 0.5 mg via INTRAVENOUS
  Filled 2020-08-03: qty 1

## 2020-08-03 MED ORDER — ACETAMINOPHEN 325 MG PO TABS: 650.0000 mg | ORAL_TABLET | Freq: Four times a day (QID) | ORAL | Status: DC | PRN

## 2020-08-03 MED ORDER — VITAMIN D (ERGOCALCIFEROL) 1.25 MG (50000 UNIT) PO CAPS
50000.0000 [IU] | ORAL_CAPSULE | ORAL | Status: DC
  Filled 2020-08-03: qty 1

## 2020-08-03 MED ORDER — GABAPENTIN 100 MG PO CAPS
100.0000 mg | ORAL_CAPSULE | Freq: Three times a day (TID) | ORAL | Status: DC
  Administered 2020-08-04 – 2020-08-05 (×5): 100 mg via ORAL
  Filled 2020-08-03 (×5): qty 1

## 2020-08-03 MED ORDER — ONDANSETRON HCL 4 MG/2ML IJ SOLN: 4.0000 mg | Freq: Four times a day (QID) | INTRAMUSCULAR | Status: DC | PRN

## 2020-08-03 MED ORDER — OXYCODONE-ACETAMINOPHEN 5-325 MG PO TABS
1.0000 | ORAL_TABLET | Freq: Once | ORAL | Status: AC
  Administered 2020-08-03: 1 via ORAL
  Filled 2020-08-03: qty 1

## 2020-08-03 MED ORDER — ENOXAPARIN SODIUM 60 MG/0.6ML ~~LOC~~ SOLN
50.0000 mg | SUBCUTANEOUS | Status: DC
  Administered 2020-08-04 – 2020-08-05 (×2): 50 mg via SUBCUTANEOUS
  Filled 2020-08-03 (×2): qty 0.6

## 2020-08-03 NOTE — H&P (Signed)
History and Physical   [facility] [place] @ [facility] Admission History and Physical [employer], D.O.    Patient Name: [name] Douglas MR#: [id number] Date of Birth: 1941/02/01 Date of Admission: 08/03/2020  Referring MD/NP/PA: Dr. Tamala Julian Primary Care Physician: Lavera Guise, MD  Chief Complaint:  Chief Complaint  Patient presents with  . back pain    HPI: [name] Douglas is a 79 y.o. female with a known history of multiple medical problems including anxiety, asthma, COPD, hypertension, hyperlipidemia, hypothyroidism, chronic low back pain recent stroke without residual deficit presents to the emergency department for evaluation of back pain.  Patient was in a usual state of health until 4 days ago when sustained a ground-level mechanical fall landing on her right hip.  She since complained of right back pain and right hip pain that impairs her ability to ambulate.  Pain is intractable and refractory to gabapentin and hydrocodone which she takes for her chronic back pain.  Daughter states that she does not take her hydrocodone around the clock as prescribed but is only taking it at bedtime.   Of note patient was at home alone but does have physical therapy coming to the house twice weekly and is usually independent with her ADLs.  She also has an appointment with Dr. Lacinda Axon from [facility] neurosurgery for consideration of lumbar laminectomy.   Patient denies fevers/chills, weakness, dizziness, chest pain, shortness of breath, N/V/C/D, abdominal pain, dysuria/frequency, changes in mental status.    Otherwise there has been no change in status. Patient has been taking medication as prescribed and there has been no recent change in medication or diet.  No recent antibiotics.  There has been no recent illness, hospitalizations, travel or sick contacts.    EMS/ED Course: Patient received Dilaudid, Percocet, Cozaar, Neurontin and Aricept.. Medical admission has been requested for further  management of intractable low back and right hip pain, rule out occult fracture.  Review of Systems:  CONSTITUTIONAL: No fever/chills, fatigue, weakness, weight gain/loss, headache. EYES: No blurry or double vision. ENT: No tinnitus, postnasal drip, redness or soreness of the oropharynx. RESPIRATORY: No cough, dyspnea, wheeze.  No hemoptysis.  CARDIOVASCULAR: No chest pain, palpitations, syncope, orthopnea. No lower extremity edema.  GASTROINTESTINAL: No nausea, vomiting, abdominal pain, diarrhea, constipation.  No hematemesis, melena or hematochezia. GENITOURINARY: No dysuria, frequency, hematuria. ENDOCRINE: No polyuria or nocturia. No heat or cold intolerance. HEMATOLOGY: No anemia, bruising, bleeding. INTEGUMENTARY: No rashes, ulcers, lesions. MUSCULOSKELETAL: Right-sided low back and hip pain no arthritis, gout, dyspnea. NEUROLOGIC: No numbness, tingling, ataxia, seizure-type activity, weakness. PSYCHIATRIC: No anxiety, depression, insomnia.   Past Medical History:  Diagnosis Date  . Anxiety   . Arthritis    "shoulders" (08/20/2014)  . Asthma   . Chronic lower back pain   . Complication of anesthesia    "agitated & restless after knee replacement in 2008"  . COPD (chronic obstructive pulmonary disease) ([place])   . DDD (degenerative disc disease), lumbar   . Depression   . Diverticulosis   . GERD (gastroesophageal reflux disease)   . High cholesterol   . Hypertension   . Hypothyroidism   . Memory loss    mild  . On home oxygen therapy    "2L at night" (08/20/2014)  . Peripheral vascular disease ([place])   . Pre-diabetes   . Shortness of breath dyspnea    with exertion  . Stroke ([place])    mild, no deficits    Past Surgical History:  Procedure Laterality  Date  . CARDIAC CATHETERIZATION  ~ 2007  . EXCISIONAL HEMORRHOIDECTOMY  1960's  . EYE SURGERY Bilateral    Cataract extraction with IOL  . KNEE ARTHROSCOPY Left X 2 <2008  . KNEE ARTHROSCOPY WITH SUBCHONDROPLASTY Right  04/19/2016   Procedure: KNEE ARTHROSCOPY WITH SUBCHONDROPLASTY, PARTIAL MENISCECTOMY;  Surgeon: Hessie Knows, MD;  Location: [facility] ORS;  Service: Orthopedics;  Laterality: Right;  . LOOP RECORDER INSERTION N/A 01/02/2018   Procedure: LOOP RECORDER INSERTION;  Surgeon: Deboraha Sprang, MD;  Location: [place] [facility];  Service: Cardiovascular;  Laterality: N/A;  . SHOULDER OPEN ROTATOR CUFF REPAIR Right 2001  . TONSILLECTOMY  ~ 1950  . TOTAL KNEE ARTHROPLASTY Left 2008  . TOTAL KNEE ARTHROPLASTY Right 07/03/2016   Procedure: TOTAL KNEE ARTHROPLASTY;  Surgeon: Hessie Knows, MD;  Location: [facility] ORS;  Service: Orthopedics;  Laterality: Right;  . TUBAL LIGATION  1973  . VAGINAL HYSTERECTOMY  1974     reports that she has been smoking cigarettes. She has a 13.25 pack-year smoking history. She has never used smokeless tobacco. She reports that she does not drink alcohol and does not use drugs.  Allergies  Allergen Reactions  . Heparin Nausea And Vomiting    Family History  Problem Relation Age of Onset  . Diabetes Mother   . Hypertension Mother   . Lung cancer Mother   . Congestive Heart Failure Father   . Congestive Heart Failure Sister     Prior to Admission medications   Medication Sig Start Date End Date Taking? Authorizing Provider  aspirin EC 81 MG tablet Take 81 mg by mouth daily. Swallow whole.    [provider]  Aspirin-Acetaminophen-Caffeine (GOODY HEADACHE PO) Take 1 packet by mouth daily as needed (headaches/pain).    [provider]  atorvastatin (LIPITOR) 80 MG tablet Take 1 tablet (80 mg total) by mouth daily at 6 PM. 05/16/20   Ronnell Freshwater, NP  clopidogrel (PLAVIX) 75 MG tablet Take 1 tablet (75 mg total) by mouth daily. 11/30/19   Ronnell Freshwater, NP  cyclobenzaprine (FLEXERIL) 5 MG tablet Take 1 tablet (5 mg total) by mouth at bedtime. 06/01/20   Ronnell Freshwater, NP  docusate sodium (COLACE) 100 MG capsule Take 100 mg by mouth daily as needed  for mild constipation.     [provider]  donepezil (ARICEPT) 5 MG tablet Take 1 tablet (5 mg total) by mouth at bedtime. 04/28/20   Ronnell Freshwater, NP  ergocalciferol (DRISDOL) 1.25 MG (50000 UT) capsule Take 1 capsule (50,000 Units total) by mouth every Friday. 07/29/20   Lavera Guise, MD  gabapentin (NEURONTIN) 100 MG capsule Take 100 mg by mouth 3 (three) times daily. 06/24/20   [provider]  HYDROcodone-acetaminophen (NORCO/VICODIN) 5-325 MG tablet Take 1-2 tablets by mouth 2 (two) times daily as needed for severe pain.  06/24/20   [provider]  levothyroxine (SYNTHROID) 75 MCG tablet Take 75 mcg by mouth daily before breakfast.    [provider]  losartan (COZAAR) 25 MG tablet Take 1 tablet (25 mg total) by mouth daily. 04/28/20   Ronnell Freshwater, NP  metoprolol succinate (TOPROL-XL) 50 MG 24 hr tablet Take 2 tablets (100 mg total) by mouth daily. 06/27/20   Ronnell Freshwater, NP  venlafaxine XR (EFFEXOR-XR) 75 MG 24 hr capsule TAKE 2 CAPSULES BY MOUTH DAILY. Patient taking differently: Take 150 mg by mouth daily with breakfast.  12/30/19   Ronnell Freshwater, NP  Physical Exam: Vitals:   08/03/20 1752 08/03/20 1802 08/03/20 2057  BP: (!) 174/56  (!) 208/50  Pulse: 70  64  Resp: 18  18  Temp: 98.5 F (36.9 C)    TempSrc: Oral    SpO2: 96%  96%  Weight: 95.3 kg 95 kg   Height: 5\' 6"  (1.676 m) 5\' 6"  (1.676 m)     GENERAL: 79 y.o.-year-old white female patient, well-developed, well-nourished lying in the bed in no acute distress.  Pleasant and cooperative.   HEENT: Head atraumatic, normocephalic. Pupils equal. Mucus membranes moist. NECK: Supple. No JVD. CHEST: Normal breath sounds bilaterally. No wheezing, rales, rhonchi or crackles. No use of accessory muscles of respiration.  No reproducible chest wall tenderness.  CARDIOVASCULAR: S1, S2 normal. No murmurs, rubs, or gallops. Cap refill <2 seconds. Pulses intact distally.  ABDOMEN: Soft,  nondistended, nontender. No rebound, guarding, rigidity. Normoactive bowel sounds present in all four quadrants.  EXTREMITIES: Mild tenderness over the right hip and right lumbar spine.  Lumbar paravertebral muscle spasm.  Negative straight leg raise test.  Leg lengths equal.  Well healed surgical scar over right knee.    No pedal edema, cyanosis, or clubbing. No calf tenderness or Homan's sign.  NEUROLOGIC: The patient is alert and oriented x 3. Cranial nerves II through XII are grossly intact with no focal sensorimotor deficit. PSYCHIATRIC:  Normal affect, mood, thought content. SKIN: Warm, dry, and intact without obvious rash, lesion, or ulcer.    Labs on Admission:  CBC: No results for input(s): WBC, NEUTROABS, HGB, HCT, MCV, PLT in the last 168 hours. Basic Metabolic Panel: No results for input(s): NA, K, CL, CO2, GLUCOSE, BUN, CREATININE, CALCIUM, MG, PHOS in the last 168 hours. GFR: Estimated Creatinine Clearance: 58.6 mL/min (by C-G formula based on SCr of 0.89 mg/dL). Liver Function Tests: No results for input(s): AST, ALT, ALKPHOS, BILITOT, PROT, ALBUMIN in the last 168 hours. No results for input(s): LIPASE, AMYLASE in the last 168 hours. No results for input(s): AMMONIA in the last 168 hours. Coagulation Profile: No results for input(s): INR, PROTIME in the last 168 hours. Cardiac Enzymes: No results for input(s): CKTOTAL, CKMB, CKMBINDEX, TROPONINI in the last 168 hours. BNP (last 3 results) No results for input(s): PROBNP in the last 8760 hours. HbA1C: No results for input(s): HGBA1C in the last 72 hours. CBG: No results for input(s): GLUCAP in the last 168 hours. Lipid Profile: No results for input(s): CHOL, HDL, LDLCALC, TRIG, CHOLHDL, LDLDIRECT in the last 72 hours. Thyroid Function Tests: No results for input(s): TSH, T4TOTAL, FREET4, T3FREE, THYROIDAB in the last 72 hours. Anemia Panel: No results for input(s): VITAMINB12, FOLATE, FERRITIN, TIBC, IRON, RETICCTPCT  in the last 72 hours. Urine analysis:    Component Value Date/Time   COLORURINE STRAW (A) 12/20/2017 1250   APPEARANCEUR Clear 03/31/2019 1459   LABSPEC 1.004 (L) 12/20/2017 1250   LABSPEC 1.035 08/02/2014 2331   PHURINE 6.0 12/20/2017 1250   GLUCOSEU CANCELED 04/07/2020 1639   GLUCOSEU Negative 08/02/2014 2331   HGBUR NEGATIVE 12/20/2017 1250   BILIRUBINUR Moderate 03/15/2020 1416   BILIRUBINUR Negative 03/31/2019 1459   BILIRUBINUR Negative 08/02/2014 2331   KETONESUR NEGATIVE 12/20/2017 1250   PROTEINUR CANCELED 04/07/2020 1639   PROTEINUR NEGATIVE 12/20/2017 1250   UROBILINOGEN 0.2 03/15/2020 1416   UROBILINOGEN 1.0 08/20/2014 2125   NITRITE Negative 03/15/2020 1416   NITRITE Negative 03/31/2019 1459   NITRITE NEGATIVE 12/20/2017 1250   LEUKOCYTESUR Trace (A) 03/15/2020 1416  LEUKOCYTESUR Negative 03/31/2019 1459   LEUKOCYTESUR Negative 08/02/2014 2331   Sepsis Labs: @LABRCNTIP (procalcitonin:4,lacticidven:4) )No results found for this or any previous visit (from the past 240 hour(s)).   Radiological Exams on Admission: DG Thoracic Spine 2 View  Result Date: 08/03/2020 CLINICAL DATA:  Fall, back pain EXAM: THORACIC SPINE 2 VIEWS COMPARISON:  None. FINDINGS: Two view radiograph thoracic spine demonstrates normal thoracic kyphosis. No acute fracture or listhesis of the thoracic spine. Vertebral body height and intervertebral disc height have been preserved. Mild endplate remodeling is seen within the mid and lower thoracic spine in keeping with changes of mild degenerative disc disease. Incidentally noted is a remote L1 compression deformity with approximately 20% loss of height. The paraspinal soft tissues are unremarkable. Note is made of an implanted loop recorder. IMPRESSION: No acute fracture or listhesis of the thoracic spine. Electronically Signed   By: Fidela Salisbury MD   On: 08/03/2020 20:22   DG Lumbar Spine 2-3 Views  Result Date: 08/03/2020 CLINICAL DATA:  Fall,  low back pain EXAM: LUMBAR SPINE - 2-3 VIEW COMPARISON:  03/15/2020 FINDINGS: Five non rib bearing segments of the lumbar spine. Normal lumbar lordosis. Remote anterior wedge compression deformity of L1 with approximately 20% loss of height is unchanged. No acute fracture or listhesis of the lumbar spine. Remaining vertebral body height has been preserved. There is diffuse intervertebral disc space narrowing and endplate remodeling throughout the lumbar spine, most severe at L4-5, in keeping with changes of moderate to severe degenerative disc disease. Facet arthrosis is noted throughout the lumbar spine, not well characterized on this examination. Atherosclerotic calcification is seen within the abdominal aorta. The paraspinal soft tissues are otherwise unremarkable. IMPRESSION: No acute fracture or listhesis of the lumbar spine. Stable mild L1 compression deformity. Diffuse moderate to severe degenerative disc and degenerative joint disease throughout the lumbar spine. Electronically Signed   By: Fidela Salisbury MD   On: 08/03/2020 20:20   DG Sacrum/Coccyx  Result Date: 08/03/2020 CLINICAL DATA:  Fall, back pain EXAM: SACRUM AND COCCYX - 2+ VIEW COMPARISON:  None. FINDINGS: There is no evidence of fracture or other focal bone lesions. IMPRESSION: Negative. Electronically Signed   By: Fidela Salisbury MD   On: 08/03/2020 20:20   DG Hip Unilat W or Wo Pelvis 2-3 Views Right  Result Date: 08/03/2020 CLINICAL DATA:  Fall, right hip pain EXAM: DG HIP (WITH OR WITHOUT PELVIS) 2-3V RIGHT COMPARISON:  None. FINDINGS: Single view radiograph pelvis and two view radiograph of the right hip demonstrates normal alignment. No fracture or dislocation. Mild bilateral degenerative hip arthritis is present with asymmetric joint space narrowing. Vascular calcifications are seen within the pelvis. Degenerative changes are seen within the lower lumbar spine, not well characterized on this examination. IMPRESSION: No acute  fracture or dislocation. Electronically Signed   By: Fidela Salisbury MD   On: 08/03/2020 20:16    Assessment/Plan  This is a 79 y.o. female with a history of anxiety, asthma, COPD, hypertension, hyperlipidemia, hypothyroidism, chronic low back pain recent stroke without residual deficit now being admitted with:  #. Intractable right hip and low back pain s/p mechanical fall -Admit to Obs -Pain control, continue home gabapentin, Flexeril, hydrocodone ATC. Increase Flexeril and add Lidocaine patches.  -PT evaluation -Follow-up CT to rule out occult fracture, consider MRI if films are negative and pain persists - Chronic, stable L1 compression fracture with multilevel DDD and spinal stenosis of the lumbar spine for consultation with Dr. Lacinda Axon of [facility]  neurosurgery next week.   #. History of CVA -Continue aspirin, Plavix  #.  History of hypothyroidism -Continue Synthroid  #. History of hypertension - Continue metoprolol  #. History of hyperlipidemia - Continue Lipitor  #. History of anxiety/depression - Continue Effexor  #. History of memory loss - Continue Aricept  Admission status: Obs IV Fluids: Hep-Lock Diet/Nutrition: Heart healthy Consults called: PT DVT Px: Lovenox, SCDs and early ambulation. Code Status: Full Code  Disposition Plan: To home in less than 24 hours  All the records are reviewed and case discussed with ED provider. Management plans discussed with the patient and/or family who express understanding and agree with plan of care.  Waverly Chavarria D.O. on 08/03/2020 at 9:15 PM CC: Primary care physician; Lavera Guise, MD   08/03/2020, 9:15 PM

## 2020-08-03 NOTE — ED Provider Notes (Signed)
[facility] Emergency Department Provider Note  ____________________________________________   Event Date/Time   First MD Initiated Contact with Patient 08/03/20 1902     (approximate)  I have reviewed the triage vital signs and the nursing notes.   HISTORY  Chief Complaint back pain   HPI [name] Douglas is a 79 y.o. female with a past medical history of anxiety, arthritis, asthma, COPD, HTN, HDL, hypothyroidism, GERD, and chronic lower back pain who presents for assessment of acute on chronic low back pain after a mechanical ground-level fall that occurred 4 days ago.  Patient states that since then she has had worsening pain in her lower back and right hip and feels that her ability to ambulate has also been decreasing.  She states she now has extreme pain whenever she tries to move walk or lay flat.  She states that when she fell was because a piece of furniture moved that she was holding onto and she fell onto her left elbow and left knee but has not had any subsequent pain in these areas.  She denies striking her head or any subsequent headache, neck pain, chest pain or other extremity pain.  Denies any sick symptoms including fevers, chills, cough, nausea, vomiting, diarrhea, dysuria, rash, urinary incontinence or other recent falls.  States he has been taking gabapentin and hydrocodone without improvement in her pain.         Past Medical History:  Diagnosis Date  . Anxiety   . Arthritis    "shoulders" (08/20/2014)  . Asthma   . Chronic lower back pain   . Complication of anesthesia    "agitated & restless after knee replacement in 2008"  . COPD (chronic obstructive pulmonary disease) ([place])   . DDD (degenerative disc disease), lumbar   . Depression   . Diverticulosis   . GERD (gastroesophageal reflux disease)   . High cholesterol   . Hypertension   . Hypothyroidism   . Memory loss    mild  . On home oxygen therapy    "2L at night" (08/20/2014)   . Peripheral vascular disease ([place])   . Pre-diabetes   . Shortness of breath dyspnea    with exertion  . Stroke ([place])    mild, no deficits    Patient Active Problem List   Diagnosis Date Noted  . CVA (cerebral vascular accident) ([place]) 07/14/2020  . Blurred vision 07/14/2020  . Muscle spasm 04/10/2020  . Urinary tract infection with hematuria 04/10/2020  . Headache disorder 12/21/2019  . Dizziness 11/06/2019  . Atopic dermatitis 10/28/2019  . History of CVA (cerebrovascular accident) 10/07/2019  . Intractable episodic cluster headache 09/20/2019  . Other symptoms and signs involving the nervous system 09/20/2019  . Iron deficiency anemia 05/17/2019  . Vitamin D deficiency 05/17/2019  . Flu vaccine need 05/17/2019  . Encounter for general adult medical examination with abnormal findings 04/05/2019  . Nausea and vomiting 04/05/2019  . Dysuria 04/05/2019  . Need for vaccination against Streptococcus pneumoniae using pneumococcal conjugate vaccine 7 04/05/2019  . Occlusion and stenosis of bilateral carotid arteries 11/24/2017  . Subarachnoid hemorrhage ([place]) 12/09/2016  . L brain TIA (transient ischemic attack) 08/24/2016  . Solitary pulmonary nodule 08/24/2016  . Aphasia   . Cellulitis of right knee 07/17/2016  . Primary localized osteoarthritis of right knee 07/03/2016  . Ileus ([place]) 04/24/2016  . Generalized abdominal pain   . Colitis due to Clostridium difficile   . Recurrent Clostridium difficile diarrhea   .  Enteritis due to Clostridium difficile 08/22/2014  . Diarrhea 08/20/2014  . Essential hypertension 08/20/2014  . Hyperlipidemia 08/20/2014  . Hypothyroidism 08/20/2014  . Tobacco abuse 08/20/2014  . COPD (chronic obstructive pulmonary disease) ([place]) 08/20/2014  . Chronic back pain 08/20/2014  . Acute diarrhea 07/27/2014    Past Surgical History:  Procedure Laterality Date  . CARDIAC CATHETERIZATION  ~ 2007  . EXCISIONAL HEMORRHOIDECTOMY  1960's  . EYE  SURGERY Bilateral    Cataract extraction with IOL  . KNEE ARTHROSCOPY Left X 2 <2008  . KNEE ARTHROSCOPY WITH SUBCHONDROPLASTY Right 04/19/2016   Procedure: KNEE ARTHROSCOPY WITH SUBCHONDROPLASTY, PARTIAL MENISCECTOMY;  Surgeon: Hessie Knows, MD;  Location: [facility] ORS;  Service: Orthopedics;  Laterality: Right;  . LOOP RECORDER INSERTION N/A 01/02/2018   Procedure: LOOP RECORDER INSERTION;  Surgeon: Deboraha Sprang, MD;  Location: [facility];  Service: Cardiovascular;  Laterality: N/A;  . SHOULDER OPEN ROTATOR CUFF REPAIR Right 2001  . TONSILLECTOMY  ~ 1950  . TOTAL KNEE ARTHROPLASTY Left 2008  . TOTAL KNEE ARTHROPLASTY Right 07/03/2016   Procedure: TOTAL KNEE ARTHROPLASTY;  Surgeon: Hessie Knows, MD;  Location: [facility] ORS;  Service: Orthopedics;  Laterality: Right;  . TUBAL LIGATION  1973  . VAGINAL HYSTERECTOMY  1974    Prior to Admission medications   Medication Sig Start Date End Date Taking? Authorizing Provider  aspirin EC 81 MG tablet Take 81 mg by mouth daily. Swallow whole.    [provider]  Aspirin-Acetaminophen-Caffeine (GOODY HEADACHE PO) Take 1 packet by mouth daily as needed (headaches/pain).    [provider]  atorvastatin (LIPITOR) 80 MG tablet Take 1 tablet (80 mg total) by mouth daily at 6 PM. 05/16/20   Ronnell Freshwater, NP  clopidogrel (PLAVIX) 75 MG tablet Take 1 tablet (75 mg total) by mouth daily. 11/30/19   Ronnell Freshwater, NP  cyclobenzaprine (FLEXERIL) 5 MG tablet Take 1 tablet (5 mg total) by mouth at bedtime. 06/01/20   Ronnell Freshwater, NP  docusate sodium (COLACE) 100 MG capsule Take 100 mg by mouth daily as needed for mild constipation.     [provider]  donepezil (ARICEPT) 5 MG tablet Take 1 tablet (5 mg total) by mouth at bedtime. 04/28/20   Ronnell Freshwater, NP  ergocalciferol (DRISDOL) 1.25 MG (50000 UT) capsule Take 1 capsule (50,000 Units total) by mouth every Friday. 07/29/20   Lavera Guise, MD  gabapentin  (NEURONTIN) 100 MG capsule Take 100 mg by mouth 3 (three) times daily. 06/24/20   [provider]  HYDROcodone-acetaminophen (NORCO/VICODIN) 5-325 MG tablet Take 1-2 tablets by mouth 2 (two) times daily as needed for severe pain.  06/24/20   [provider]  levothyroxine (SYNTHROID) 75 MCG tablet Take 75 mcg by mouth daily before breakfast.    [provider]  losartan (COZAAR) 25 MG tablet Take 1 tablet (25 mg total) by mouth daily. 04/28/20   Ronnell Freshwater, NP  metoprolol succinate (TOPROL-XL) 50 MG 24 hr tablet Take 2 tablets (100 mg total) by mouth daily. 06/27/20   Ronnell Freshwater, NP  venlafaxine XR (EFFEXOR-XR) 75 MG 24 hr capsule TAKE 2 CAPSULES BY MOUTH DAILY. Patient taking differently: Take 150 mg by mouth daily with breakfast.  12/30/19   Ronnell Freshwater, NP    Allergies Heparin  Family History  Problem Relation Age of Onset  . Diabetes Mother   . Hypertension Mother   . Lung cancer Mother   .  Congestive Heart Failure Father   . Congestive Heart Failure Sister     Social History Social History   Tobacco Use  . Smoking status: Current Some Day Smoker    Packs/day: 0.25    Years: 53.00    Pack years: 13.25    Types: Cigarettes  . Smokeless tobacco: Never Used  . Tobacco comment: 3 cigarettes in week  Vaping Use  . Vaping Use: Never used  Substance Use Topics  . Alcohol use: No  . Drug use: No    Review of Systems  Review of Systems  Constitutional: Negative for chills and fever.  HENT: Negative for sore throat.   Eyes: Negative for pain.  Respiratory: Negative for cough and stridor.   Cardiovascular: Negative for chest pain.  Gastrointestinal: Negative for vomiting.  Genitourinary: Negative for dysuria.  Musculoskeletal: Positive for back pain, falls and joint pain ( R hip). Negative for neck pain.  Skin: Negative for rash.  Neurological: Negative for seizures, loss of consciousness and headaches.  Psychiatric/Behavioral:  Negative for suicidal ideas.  All other systems reviewed and are negative.     ____________________________________________   PHYSICAL EXAM:  VITAL SIGNS: ED Triage Vitals  Enc Vitals Group     BP 08/03/20 1752 (!) 174/56     Pulse Rate 08/03/20 1752 70     Resp 08/03/20 1752 18     Temp 08/03/20 1752 98.5 F (36.9 C)     Temp Source 08/03/20 1752 Oral     SpO2 08/03/20 1752 96 %     Weight 08/03/20 1752 210 lb (95.3 kg)     Height 08/03/20 1752 5\' 6"  (1.676 m)     Head Circumference --      [facility] --      Pain Score 08/03/20 1802 9     Pain Loc --      Pain Edu? --      Excl. in [place]? --    Vitals:   08/03/20 1752 08/03/20 2057  BP: (!) 174/56 (!) 208/50  Pulse: 70 64  Resp: 18 18  Temp: 98.5 F (36.9 C)   SpO2: 96% 96%   Physical Exam Vitals and nursing note reviewed.  Constitutional:      General: She is not in acute distress.    Appearance: She is well-developed and well-nourished. She is obese.  HENT:     Head: Normocephalic and atraumatic.     Right Ear: External ear normal.     Left Ear: External ear normal.     Nose: Nose normal.  Eyes:     Conjunctiva/sclera: Conjunctivae normal.  Cardiovascular:     Rate and Rhythm: Normal rate and regular rhythm.     Heart sounds: No murmur heard.   Pulmonary:     Effort: Pulmonary effort is normal. No respiratory distress.     Breath sounds: Normal breath sounds.  Abdominal:     Palpations: Abdomen is soft.     Tenderness: There is no abdominal tenderness.  Musculoskeletal:        General: No edema.     Cervical back: Neck supple.  Skin:    General: Skin is warm and dry.  Neurological:     Mental Status: She is alert and oriented to person, place, and time.  Psychiatric:        Mood and Affect: Mood and affect and mood normal.     Patient has full and symmetric strength on her bilateral upper extremities.  There is  no tenderness step-offs or deformities over the C or T-spine but there is some  tenderness over the coccyx and L-spine.  There is also some mild tenderness of the right hip.  Patient is weaker on the right hip on flexion extension compared to the left.  She otherwise has full strength on her left lower extremity and is able to plantar and dorsiflex with symmetric strength.  Sensation is intact to light touch of both lower extremities.  Bilateral upper extremities have full strength are unremarkable specifically the bilateral elbows and wrist.  2+ bilateral radial pulse. ____________________________________________   LABS (all labs ordered are listed, but only abnormal results are displayed)  Labs Reviewed  RESP PANEL BY RT-PCR (FLU A&B, COVID) ARPGX2  CBC WITH DIFFERENTIAL/PLATELET  COMPREHENSIVE METABOLIC PANEL   ____________________________________________   ____________________________________________  RADIOLOGY  ED MD interpretation: No evidence of acute fracture dislocation on plain films of the patient's T-spine, L-spine or sacral imaging.  There is unknown compression deformity of L1.  There is also severe degenerative changes throughout.  Official radiology report(s): DG Thoracic Spine 2 View  Result Date: 08/03/2020 CLINICAL DATA:  Fall, back pain EXAM: THORACIC SPINE 2 VIEWS COMPARISON:  None. FINDINGS: Two view radiograph thoracic spine demonstrates normal thoracic kyphosis. No acute fracture or listhesis of the thoracic spine. Vertebral body height and intervertebral disc height have been preserved. Mild endplate remodeling is seen within the mid and lower thoracic spine in keeping with changes of mild degenerative disc disease. Incidentally noted is a remote L1 compression deformity with approximately 20% loss of height. The paraspinal soft tissues are unremarkable. Note is made of an implanted loop recorder. IMPRESSION: No acute fracture or listhesis of the thoracic spine. Electronically Signed   By: Fidela Salisbury MD   On: 08/03/2020 20:22   DG Lumbar  Spine 2-3 Views  Result Date: 08/03/2020 CLINICAL DATA:  Fall, low back pain EXAM: LUMBAR SPINE - 2-3 VIEW COMPARISON:  03/15/2020 FINDINGS: Five non rib bearing segments of the lumbar spine. Normal lumbar lordosis. Remote anterior wedge compression deformity of L1 with approximately 20% loss of height is unchanged. No acute fracture or listhesis of the lumbar spine. Remaining vertebral body height has been preserved. There is diffuse intervertebral disc space narrowing and endplate remodeling throughout the lumbar spine, most severe at L4-5, in keeping with changes of moderate to severe degenerative disc disease. Facet arthrosis is noted throughout the lumbar spine, not well characterized on this examination. Atherosclerotic calcification is seen within the abdominal aorta. The paraspinal soft tissues are otherwise unremarkable. IMPRESSION: No acute fracture or listhesis of the lumbar spine. Stable mild L1 compression deformity. Diffuse moderate to severe degenerative disc and degenerative joint disease throughout the lumbar spine. Electronically Signed   By: Fidela Salisbury MD   On: 08/03/2020 20:20   DG Sacrum/Coccyx  Result Date: 08/03/2020 CLINICAL DATA:  Fall, back pain EXAM: SACRUM AND COCCYX - 2+ VIEW COMPARISON:  None. FINDINGS: There is no evidence of fracture or other focal bone lesions. IMPRESSION: Negative. Electronically Signed   By: Fidela Salisbury MD   On: 08/03/2020 20:20   DG Hip Unilat W or Wo Pelvis 2-3 Views Right  Result Date: 08/03/2020 CLINICAL DATA:  Fall, right hip pain EXAM: DG HIP (WITH OR WITHOUT PELVIS) 2-3V RIGHT COMPARISON:  None. FINDINGS: Single view radiograph pelvis and two view radiograph of the right hip demonstrates normal alignment. No fracture or dislocation. Mild bilateral degenerative hip arthritis is present with asymmetric joint space narrowing.  Vascular calcifications are seen within the pelvis. Degenerative changes are seen within the lower lumbar spine, not  well characterized on this examination. IMPRESSION: No acute fracture or dislocation. Electronically Signed   By: Fidela Salisbury MD   On: 08/03/2020 20:16    ____________________________________________   PROCEDURES  Procedure(s) performed (including Critical Care):  Procedures   ____________________________________________   INITIAL IMPRESSION / ASSESSMENT AND PLAN / ED COURSE      Patient presents with left history exam for assessment of some acute on chronic lower back pain and right hip discomfort that has been progressively limiting her ability to ambulate and get around her home since a fall couple days ago.  Patient is hypertensive but otherwise with stable vital signs on arrival.  She does have some tenderness in her lower back and weakness in the right hip on exam.  She denies any other acute pain and on exam has no evidence of injury to her left knee or elbow where she states she fell.  She is otherwise neurovascular intact in all extremities.  Plain films of the right hip, sacrum and coccyx, L-spine and T-spine shows no evidence of acute fracture or dislocation.  Plain film of the L-spine does show a previously noted L1 compression deformity and severe diffuse degenerative joint disease throughout the L-spine.  Given persistent severe pain limiting ambulation with no evidence of acute fracture CT is ordered due to possible occult compression fracture limiting symptoms.   Very low suspicion for acute infectious process metabolic derangement or other immediate life-threatening pathology at this time.  No findings on history or exam to suggest acute spinal cord compression.  Had a long discussion with patient and her daughter at bedside regarding patient's severe uncontrolled pain on her hydrocodone at home and her inability to ambulate.  Patient does live alone and has a physical therapist who sees her twice a week but otherwise does not have any help.  I discussed with patient and  daughter my concern about patient being able to adequately care for self and get around safely in her home if she is unable to ambulate secondary to pain.  Discussed recommendation for observation so that patient's pain could be adequately controlled and she can get PT and OT.  Both patient and her daughter are amenable to this.  We will plan to admit to medicine in order CT to assess for occult injury not seen on x-rays.       ____________________________________________   FINAL CLINICAL IMPRESSION(S) / ED DIAGNOSES  Final diagnoses:  Fall  Acute right-sided low back pain with right-sided sciatica    Medications  losartan (COZAAR) tablet 25 mg (25 mg Oral Given 08/03/20 2106)  donepezil (ARICEPT) tablet 5 mg (5 mg Oral Given 08/03/20 2106)  oxyCODONE-acetaminophen (PERCOCET/ROXICET) 5-325 MG per tablet 1 tablet (1 tablet Oral Given 08/03/20 1926)  gabapentin (NEURONTIN) tablet 300 mg (300 mg Oral Given 08/03/20 1926)  HYDROmorphone (DILAUDID) injection 0.5 mg (0.5 mg Intravenous Given 08/03/20 2107)     ED Discharge Orders    None       Note:  This document was prepared using Dragon voice recognition software and may include unintentional dictation errors.   Lucrezia Starch, MD 08/03/20 2118

## 2020-08-03 NOTE — Telephone Encounter (Signed)
New message   Called pt to RS appt with AT from 10/15. The appt was for [facility]. Pt said she thought she had covid then, which is why it was cancelled. She said since then she's supposed to have had back surgery which was postponed because she had a stroke on Thanksgiving. She said she is waiting to hear back from the surgeon to RS her surgery and then she will RS our appt because she's in no shape to come to an appt right now.

## 2020-08-03 NOTE — ED Triage Notes (Signed)
Pt to ED POV for chief complaint of lower back pain worsening since fall on Sunday. Fall caused by rolling chair.  Denies hitting head with fall, or LOC Denies loss of bowel or bladder function Has not taken anything for pain Pt in NAD

## 2020-08-04 DIAGNOSIS — M5459 Other low back pain: Secondary | ICD-10-CM | POA: Diagnosis not present

## 2020-08-04 DIAGNOSIS — M5441 Lumbago with sciatica, right side: Secondary | ICD-10-CM | POA: Diagnosis not present

## 2020-08-04 LAB — CBC
HCT: 34 % — ABNORMAL LOW (ref 36.0–46.0)
HCT: 32.3 % — ABNORMAL LOW (ref 36.0–46.0)
Hemoglobin: 11.3 g/dL — ABNORMAL LOW (ref 12.0–15.0)
Hemoglobin: 10.6 g/dL — ABNORMAL LOW (ref 12.0–15.0)
MCH: 30.8 pg (ref 26.0–34.0)
MCH: 30.5 pg (ref 26.0–34.0)
MCHC: 33.2 g/dL (ref 30.0–36.0)
MCHC: 32.8 g/dL (ref 30.0–36.0)
MCV: 92.6 fL (ref 80.0–100.0)
MCV: 93.1 fL (ref 80.0–100.0)
Platelets: 255 10*3/uL (ref 150–400)
Platelets: 247 10*3/uL (ref 150–400)
RBC: 3.67 MIL/uL — ABNORMAL LOW (ref 3.87–5.11)
RBC: 3.47 MIL/uL — ABNORMAL LOW (ref 3.87–5.11)
RDW: 13 % (ref 11.5–15.5)
RDW: 13 % (ref 11.5–15.5)
WBC: 7 10*3/uL (ref 4.0–10.5)
WBC: 7 10*3/uL (ref 4.0–10.5)
nRBC: 0 % (ref 0.0–0.2)
nRBC: 0 % (ref 0.0–0.2)

## 2020-08-04 LAB — COMPREHENSIVE METABOLIC PANEL
ALT: 10 U/L (ref 0–44)
AST: 13 U/L — ABNORMAL LOW (ref 15–41)
Albumin: 3.7 g/dL (ref 3.5–5.0)
Alkaline Phosphatase: 52 U/L (ref 38–126)
Anion gap: 7 (ref 5–15)
BUN: 18 mg/dL (ref 8–23)
CO2: 30 mmol/L (ref 22–32)
Calcium: 9.4 mg/dL (ref 8.9–10.3)
Chloride: 107 mmol/L (ref 98–111)
Creatinine, Ser: 0.85 mg/dL (ref 0.44–1.00)
GFR, Estimated: >60 mL/min (ref >60)
Glucose, Bld: 110 mg/dL — ABNORMAL HIGH (ref 70–99)
Potassium: 3.8 mmol/L (ref 3.5–5.1)
Sodium: 144 mmol/L (ref 135–145)
Total Bilirubin: 0.3 mg/dL (ref 0.3–1.2)
Total Protein: 6.2 g/dL — ABNORMAL LOW (ref 6.5–8.1)

## 2020-08-04 LAB — CREATININE, SERUM
Creatinine, Ser: 0.79 mg/dL (ref 0.44–1.00)
GFR, Estimated: >60 mL/min (ref >60)

## 2020-08-04 MED ORDER — LEVOTHYROXINE SODIUM 50 MCG PO TABS
75.0000 ug | ORAL_TABLET | Freq: Every day | ORAL | Status: DC
  Administered 2020-08-04 – 2020-08-05 (×2): 75 ug via ORAL
  Filled 2020-08-04 (×2): qty 1

## 2020-08-04 NOTE — Evaluation (Addendum)
Physical Therapy Evaluation Patient Details Name: [name] Douglas MRN: [id number] DOB: 02-15-1941 Today's Date: 08/04/2020   History of Present Illness  79 year old female with PMH stroke, essential hypertension, COPD, chronic back pain.  Here after fall at home with R hip and back pain.  of note: She was here 1 month ago with sudden onset of blurred vision and lightheadedness; admitted for TIA/CVA work up.  Imaging significant for L pons hemorrhage, stable between images.  Clinical Impression  Pt eager to get up and work with PT.  She is wanting to go home but after working with PT today realized that she is not yet safe to do so.  She was able to walk ~75 ft but was highly reliant on the walker (typically does not need any AD) and had 2 LOBs while negotiating the steps needing direct assist to maintain upright. She showed great effort but was ultimately pain limited with acute on chronic back pain as well as acute R hip pain from fall that led to admission.  She does have a daughter that checks in daily but there is no option for 24/7 assist.  Pt generally showed good effort and willingness to work with PT but was ultimately too pain limited to be able to show ability to safely go home at this time.    Follow Up Recommendations Home health PT, 24/7 supervision per progress    Equipment Recommendations   (would benefit from bed rail)    Recommendations for Other Services       Precautions / Restrictions Precautions Precautions: Fall Restrictions Weight Bearing Restrictions: No      Mobility  Bed Mobility Overal bed mobility: Needs Assistance Bed Mobility: Supine to Sit     Supine to sit: Min assist     General bed mobility comments: Pt initially completely unable to get toward EOB 2/2 pain, PT gave cuing about log roll and maintaining spinal neutral.  She was able to do a bulk of the effort needing only light PT assist to get to upright, however she was heavily reliant on the  rails for UE assist    Transfers Overall transfer level: Needs assistance Equipment used: Rolling walker (2 wheeled) Transfers: Sit to/from Stand Sit to Stand: Min guard         General transfer comment: Pt again needing heavy UE use to attain standing, c/o increased pain instantly on standing (back and R hip) and though she did not have any LOBs she was clearly very reliant on the walker and very guarded  Ambulation/Gait Ambulation/Gait assistance: Min guard Gait Distance (Feet): 75 Feet Assistive device: Rolling walker (2 wheeled)       General Gait Details: Pt with very guarded and UE/AD reliant gait.  She generally was able to maintain balance and slow cadence but had to stop secondary to both back and R hip pain being too much to tolerate.  Cues to [facility] as able on the walker but pt clearly struggling 2/2 pain.  Pt does not typically need/use and AD and is far from her baseline regarding ambulation/mobility at this time.  Stairs Stairs: Yes Stairs assistance: Mod assist Stair Management: Two rails;Forwards Number of Stairs: 4 General stair comments: Pt was able to ascend steps with b/l rails and L LE leading, however even with cuing for proper technique and heavy UE use she had 2 LOBs descending steps; nearly loosing balance backward and needing considerable assist to maintain upright.  Wheelchair Mobility    Modified Rankin (  Stroke Patients Only)       Balance Overall balance assessment: Needs assistance   Sitting balance-Leahy Scale: Good Sitting balance - Comments: Pt able to maintain balance at EOB and even don shoes while sitting (though she did have increased pain with bend/twist aspects of this)   Standing balance support: Bilateral upper extremity supported Standing balance-Leahy Scale: Fair Standing balance comment: highly reliant on the walker, two LOBs while on steps.                             Pertinent Vitals/Pain Pain Assessment:  0-10 Pain Score: 8  Pain Location: Pt with very mild pain at rest in supine, however with any movement back pain increases significantly and then in standing R hip pain becomes very limiting    Home Living Family/patient expects to be discharged to:: Private residence Living Arrangements: Alone Available Help at Discharge: Family;Available PRN/intermittently Type of Home: Apartment Home Access: Elevator (she does have 2 steps to enter the building before elevator to 4th floor)     Home Layout: One level Home Equipment: Walker - 4 wheels;Cane - single point;Shower seat;Hand held Tourist information centre manager - 2 wheels      Prior Function Level of Independence: Independent         Comments: Ind ADLs (sponge bathes 2/2 fear of falling in tub) and household/community mobility (has AD but does not use, endorses furniture walking). Was driving and Ind IADLs until ~02/2020 r/t low back pain - daughter does groceries/errands     Hand Dominance        Extremity/Trunk Assessment   Upper Extremity Assessment Upper Extremity Assessment: Generalized weakness (age appropriate defecits)    Lower Extremity Assessment Lower Extremity Assessment: Generalized weakness (tolerates testing but pain with most resisted acts, especially R hip)       Communication   Communication: No difficulties  Cognition Arousal/Alertness: Awake/alert Behavior During Therapy: WFL for tasks assessed/performed Overall Cognitive Status: Within Functional Limits for tasks assessed                                        General Comments      Exercises     Assessment/Plan    PT Assessment Patient needs continued PT services  PT Problem List Decreased activity tolerance;Decreased balance;Decreased mobility;Decreased knowledge of use of DME;Decreased safety awareness;Decreased knowledge of precautions;Pain;Decreased strength;Decreased range of motion       PT Treatment Interventions DME  instruction;Gait training;Stair training;Functional mobility training;Therapeutic activities;Therapeutic exercise;Balance training;Patient/family education    PT Goals (Current goals can be found in the Care Plan section)  Acute Rehab PT Goals Patient Stated Goal: To return home  PT Goal Formulation: With patient Time For Goal Achievement: 08/18/20 Potential to Achieve Goals: Fair    Frequency Min 2X/week   Barriers to discharge        Co-evaluation               AM-PAC PT "6 Clicks" Mobility  Outcome Measure Help needed turning from your back to your side while in a flat bed without using bedrails?: A Little Help needed moving from lying on your back to sitting on the side of a flat bed without using bedrails?: A Lot Help needed moving to and from a bed to a chair (including a wheelchair)?: A Little Help needed standing up from a chair  using your arms (e.g., wheelchair or bedside chair)?: A Little Help needed to walk in hospital room?: A Lot Help needed climbing 3-5 steps with a railing? : A Lot 6 Click Score: 15    End of Session Equipment Utilized During Treatment: Gait belt Activity Tolerance: Patient limited by pain Patient left: with chair alarm set;with call bell/phone within reach Nurse Communication: Mobility status PT Visit Diagnosis: Muscle weakness (generalized) (M62.81);Difficulty in walking, not elsewhere classified (R26.2);Pain;Unsteadiness on feet (R26.81) Pain - part of body:  (lumbago)    Time: 0935-1010 PT Time Calculation (min) (ACUTE ONLY): 35 min   Charges:   PT Evaluation $PT Eval Low Complexity: 1 Low PT Treatments $Gait Training: 8-22 mins $Therapeutic Activity: 8-22 mins        Kreg Shropshire, DPT 08/04/2020, 11:28 AM

## 2020-08-04 NOTE — TOC Initial Note (Signed)
Transition of Care [facility]) - Initial/Assessment Note    Patient Details  Name: [name] Douglas MRN: [id number] Date of Birth: May 23, 1941  Transition of Care [facility]) CM/SW Contact:    Magnus Ivan, LCSW Phone Number: 08/04/2020, 11:01 AM  Clinical Narrative:                Patient known to this CSW from previous hospitalization this month. CSW spoke to patient who confirmed no changes in home set up information listed below. Plan for patient to return home with home health at discharge. CSW confirmed with Malachy Mood with [facility] that they are active with her for HHPT and will resume at discharge, planned for tomorrow. Patient reported her daughter will take her home at time of discharge.  -lives alone -PCP Dr. Humphrey Rolls -[facility] -Daughter transports to appointments -has RW, cane, motorized scooter, and 3 in 1 -HHPT through [employer]    Expected Discharge Plan: [place] Barriers to Discharge: Continued Medical Work up   Patient Goals and CMS Choice Patient states their goals for this hospitalization and ongoing recovery are:: to return home with home health CMS Medicare.gov Compare Post Acute Care list provided to:: Patient Choice offered to / list presented to : Patient  Expected Discharge Plan and Services Expected Discharge Plan: [place]       Living arrangements for the past 2 months: Single Family Home                           HH Arranged: PT [facility] Agency: [place] Date [place]: 08/04/20   Representative spoke with at [place]: Sharmon Revere  Prior Living Arrangements/Services Living arrangements for the past 2 months: [place] Lives with:: Self Patient language and need for interpreter reviewed:: Yes Do you feel safe going back to the place where you live?: Yes      Need for Family Participation in Patient Care: Yes (Comment) Care giver support system in place?: Yes  (comment) Current home services: DME Criminal Activity/Legal Involvement Pertinent to Current Situation/Hospitalization: No - Comment as needed  Activities of Daily Living Home Assistive Devices/Equipment: Walker (specify type) (Frontwheel walker) ADL Screening (condition at time of admission) Patient's cognitive ability adequate to safely complete daily activities?: Yes Is the patient deaf or have difficulty hearing?: No Does the patient have difficulty seeing, even when wearing glasses/contacts?: No Does the patient have difficulty concentrating, remembering, or making decisions?: No Patient able to express need for assistance with ADLs?: No Does the patient have difficulty dressing or bathing?: No Independently performs ADLs?: Yes (appropriate for developmental age) Does the patient have difficulty walking or climbing stairs?: Yes Weakness of Legs: None Weakness of Arms/Hands: None  Permission Sought/Granted Permission sought to share information with : Facility Retail banker granted to share information with : Yes, Verbal Permission Granted     Permission granted to share info w AGENCY: [place], DME  Permission granted to share info w Relationship: daughter     Emotional Assessment       Orientation: : Oriented to Self,Oriented to Place,Oriented to  Time,Oriented to Situation Alcohol / Substance Use: Not Applicable Psych Involvement: No (comment)  Admission diagnosis:  Fall [W19.XXXA] Pelvic pain [R10.2] Intractable low back pain [M54.59] Acute right-sided low back pain with right-sided sciatica [M54.41] Patient Active Problem List   Diagnosis Date Noted  . Intractable low back pain 08/03/2020  .  CVA (cerebral vascular accident) ([place]) 07/14/2020  . Blurred vision 07/14/2020  . Muscle spasm 04/10/2020  . Urinary tract infection with hematuria 04/10/2020  . Headache disorder 12/21/2019  . Dizziness 11/06/2019  . Atopic dermatitis  10/28/2019  . History of CVA (cerebrovascular accident) 10/07/2019  . Intractable episodic cluster headache 09/20/2019  . Other symptoms and signs involving the nervous system 09/20/2019  . Iron deficiency anemia 05/17/2019  . Vitamin D deficiency 05/17/2019  . Flu vaccine need 05/17/2019  . Encounter for general adult medical examination with abnormal findings 04/05/2019  . Nausea and vomiting 04/05/2019  . Dysuria 04/05/2019  . Need for vaccination against Streptococcus pneumoniae using pneumococcal conjugate vaccine 7 04/05/2019  . Occlusion and stenosis of bilateral carotid arteries 11/24/2017  . Subarachnoid hemorrhage ([place]) 12/09/2016  . L brain TIA (transient ischemic attack) 08/24/2016  . Solitary pulmonary nodule 08/24/2016  . Aphasia   . Cellulitis of right knee 07/17/2016  . Primary localized osteoarthritis of right knee 07/03/2016  . Ileus ([place]) 04/24/2016  . Generalized abdominal pain   . Colitis due to Clostridium difficile   . Recurrent Clostridium difficile diarrhea   . Enteritis due to Clostridium difficile 08/22/2014  . Diarrhea 08/20/2014  . Essential hypertension 08/20/2014  . Hyperlipidemia 08/20/2014  . Hypothyroidism 08/20/2014  . Tobacco abuse 08/20/2014  . COPD (chronic obstructive pulmonary disease) ([place]) 08/20/2014  . Chronic back pain 08/20/2014  . Acute diarrhea 07/27/2014   PCP:  Lavera Guise, MD Pharmacy:   [place], [place] - [address] [address] Phone: [phone] Fax: [phone]     Social Determinants of Health (SDOH) Interventions    Readmission Risk Interventions No flowsheet data found.

## 2020-08-04 NOTE — Progress Notes (Signed)
Anticoagulation monitoring(Lovenox):  79 yo female ordered Lovenox 40 mg Q24h  Filed Weights   08/03/20 1752 08/03/20 1802 08/04/20 0015  Weight: 95.3 kg (210 lb) 95 kg (209 lb 7 oz) 97.2 kg (214 lb 4.6 oz)   BMI 34.59   Lab Results  Component Value Date   CREATININE 0.66 08/03/2020   CREATININE 0.89 07/14/2020   CREATININE 0.73 11/06/2019   Estimated Creatinine Clearance: 66 mL/min (by C-G formula based on SCr of 0.66 mg/dL). Hemoglobin & Hematocrit     Component Value Date/Time   HGB 11.7 (L) 08/03/2020 2056   HGB 10.3 (L) 05/07/2019 1409   HCT 35.2 (L) 08/03/2020 2056   HCT 31.2 (L) 05/07/2019 1409     Per Protocol for Patient with estCrcl > 30 ml/min and BMI > 30, will transition to Lovenox 50 mg Q24h.

## 2020-08-04 NOTE — Progress Notes (Signed)
PROGRESS NOTE    [name] Douglas  BUL:[id number] DOB: 05-Jan-1941 DOA: 08/03/2020 PCP: Lavera Guise, MD   Chief Complain: Back pain  Brief Narrative: Patient is a 79 year old  female with history of anxiety, asthma, COPD, hypertension, hyperlipidemia, hypothyroidism, chronic low back pain from L1 vertebral fracture, recent stroke without residual deficits who presented to the emergency department for the evaluation of back pain after she sustained a mechanical fall landing on her right hip.  Extensive skeletal survey done on presentation did not show any fracture or dislocation.  PT/OT recommended home health on discharge.  Plan for discharge tomorrow to home after better pain control.  Assessment & Plan:   Active Problems:   Intractable low back pain   Intractable right hip/low back pain: Status post mechanical fall.  Continue supportive care, analgesics.  PT/OT recommended home health on discharge. Extensive skeletal survey did not show any new fracture or dislocation.  Imaging showed a stable L1 compression deformity, moderately severe degenerative takes disease.  She follows with [facility] and is being planned for surgery for L1 compression fracture.  History of CVA: On aspirin, Plavix which we will continue  Hypothyroidism: Continue Synthyroid  Hypertension: Currently on metoprolol.  Continue to monitor blood pressure  Hyperlipidemia: Continue Lipitor  History of anxiety/depression: Continue Effexor  History of memory deficits: Continue Aricept.  Supportive care.  Return precautions.         DVT prophylaxis:Observation Code Status: Full Family Communication:talked to daughter on phone today  Status is: Observation  The patient remains OBS appropriate and will d/c before 2 midnights.  Dispo: The patient is from: Home              Anticipated d/c is to: Home              Anticipated d/c date is: 1 day              Patient currently is not medically stable to  d/c.  Patient needs additional PT therapy and additional pain control before safe discharge   Consultants: None  Procedures: None  Antimicrobials:  Anti-infectives (From admission, onward)   None      Subjective: Patient seen and examined at the bedside this morning.  Hemodynamically stable during my evaluation.  She was overall comfortable when I evaluated her when she was at rest at bed.  During assessment with physical therapy, she became short of breath and complained of severe back pain.  Objective: Vitals:   08/04/20 0015 08/04/20 0351 08/04/20 0840 08/04/20 1123  BP: (!) 164/44 124/60 (!) 157/49 (!) 146/41  Pulse: (!) 59 (!) 58 63 64  Resp: 18 16 18 18   Temp: 97.8 F (36.6 C) 98.1 F (36.7 C) 98.3 F (36.8 C) 97.7 F (36.5 C)  TempSrc: Oral  Oral Oral  SpO2: 98% 94% 94% 98%  Weight: 97.2 kg     Height: 5\' 6"  (1.676 m)      No intake or output data in the 24 hours ending 08/04/20 1249 Filed Weights   08/03/20 1752 08/03/20 1802 08/04/20 0015  Weight: 95.3 kg 95 kg 97.2 kg    Examination:  General exam: Pleasant elderly female HEENT:PERRL,Oral mucosa moist, Ear/Nose normal on gross exam Respiratory system: Bilateral equal air entry, normal vesicular breath sounds, no wheezes or crackles  Cardiovascular system: S1 & S2 heard, RRR. No JVD, murmurs, rubs, gallops or clicks. No pedal edema. Gastrointestinal system: Abdomen is nondistended, soft and nontender. No organomegaly or  masses felt. Normal bowel sounds heard. Central nervous system: Alert and oriented. No focal neurological deficits. Extremities: No edema, no clubbing ,no cyanosis Skin: No rashes, lesions or ulcers,no icterus ,no pallor   Data Reviewed: I have personally reviewed following labs and imaging studies  CBC: Recent Labs  Lab 08/03/20 2056 08/04/20 0037 08/04/20 0521  WBC 7.7 7.0 7.0  NEUTROABS 4.9  --   --   HGB 11.7* 11.3* 10.6*  HCT 35.2* 34.0* 32.3*  MCV 92.6 92.6 93.1  PLT 252  255 485   Basic Metabolic Panel: Recent Labs  Lab 08/03/20 2056 08/04/20 0037 08/04/20 0521  NA 141  --  144  K 3.7  --  3.8  CL 104  --  107  CO2 26  --  30  GLUCOSE 95  --  110*  BUN 14  --  18  CREATININE 0.66 0.79 0.85  CALCIUM 9.0  --  9.4   GFR: Estimated Creatinine Clearance: 62.1 mL/min (by C-G formula based on SCr of 0.85 mg/dL). Liver Function Tests: Recent Labs  Lab 08/03/20 2056 08/04/20 0521  AST 16 13*  ALT 11 10  ALKPHOS 56 52  BILITOT 0.6 0.3  PROT 6.7 6.2*  ALBUMIN 4.1 3.7   No results for input(s): LIPASE, AMYLASE in the last 168 hours. No results for input(s): AMMONIA in the last 168 hours. Coagulation Profile: No results for input(s): INR, PROTIME in the last 168 hours. Cardiac Enzymes: No results for input(s): CKTOTAL, CKMB, CKMBINDEX, TROPONINI in the last 168 hours. BNP (last 3 results) No results for input(s): PROBNP in the last 8760 hours. HbA1C: No results for input(s): HGBA1C in the last 72 hours. CBG: No results for input(s): GLUCAP in the last 168 hours. Lipid Profile: No results for input(s): CHOL, HDL, LDLCALC, TRIG, CHOLHDL, LDLDIRECT in the last 72 hours. Thyroid Function Tests: No results for input(s): TSH, T4TOTAL, FREET4, T3FREE, THYROIDAB in the last 72 hours. Anemia Panel: No results for input(s): VITAMINB12, FOLATE, FERRITIN, TIBC, IRON, RETICCTPCT in the last 72 hours. Sepsis Labs: No results for input(s): PROCALCITON, LATICACIDVEN in the last 168 hours.  Recent Results (from the past 240 hour(s))  Resp Panel by RT-PCR (Flu A&B, Covid) Nasopharyngeal Swab     Status: None   Collection Time: 08/03/20  8:56 PM   Specimen: Nasopharyngeal Swab; Nasopharyngeal(NP) swabs in vial transport medium  Result Value Ref Range Status   SARS Coronavirus 2 by RT PCR NEGATIVE NEGATIVE Final    Comment: (NOTE) SARS-CoV-2 target nucleic acids are NOT DETECTED.  The SARS-CoV-2 RNA is generally detectable in upper respiratory specimens  during the acute phase of infection. The lowest concentration of SARS-CoV-2 viral copies this assay can detect is 138 copies/mL. A negative result does not preclude SARS-Cov-2 infection and should not be used as the sole basis for treatment or other patient management decisions. A negative result may occur with  improper specimen collection/handling, submission of specimen other than nasopharyngeal swab, presence of viral mutation(s) within the areas targeted by this assay, and inadequate number of viral copies(<138 copies/mL). A negative result must be combined with clinical observations, patient history, and epidemiological information. The expected result is Negative.  Fact Sheet for Patients:  EntrepreneurPulse.com.au  Fact Sheet for Healthcare Providers:  IncredibleEmployment.be  This test is no t yet approved or cleared by the [place] FDA and  has been authorized for detection and/or diagnosis of SARS-CoV-2 by FDA under an Emergency Use Authorization (EUA). This EUA will remain  in effect (meaning this test can be used) for the duration of the COVID-19 declaration under Section 564(b)(1) of the Act, 21 U.S.C.section 360bbb-3(b)(1), unless the authorization is terminated  or revoked sooner.       Influenza A by PCR NEGATIVE NEGATIVE Final   Influenza B by PCR NEGATIVE NEGATIVE Final    Comment: (NOTE) The Xpert Xpress SARS-CoV-2/FLU/RSV plus assay is intended as an aid in the diagnosis of influenza from Nasopharyngeal swab specimens and should not be used as a sole basis for treatment. Nasal washings and aspirates are unacceptable for Xpert Xpress SARS-CoV-2/FLU/RSV testing.  Fact Sheet for Patients: EntrepreneurPulse.com.au  Fact Sheet for Healthcare Providers: IncredibleEmployment.be  This test is not yet approved or cleared by the [place] FDA and has been authorized for detection  and/or diagnosis of SARS-CoV-2 by FDA under an Emergency Use Authorization (EUA). This EUA will remain in effect (meaning this test can be used) for the duration of the COVID-19 declaration under Section 564(b)(1) of the Act, 21 U.S.C. section 360bbb-3(b)(1), unless the authorization is terminated or revoked.  Performed at [facility], [address] [place], [place] [address]          Radiology Studies: CT ABDOMEN PELVIS WO CONTRAST  Result Date: 08/03/2020 CLINICAL DATA:  Abdominal trauma. Patient reports worsening low back pain since fall on Sunday. EXAM: CT ABDOMEN AND PELVIS WITHOUT CONTRAST TECHNIQUE: Multidetector CT imaging of the abdomen and pelvis was performed following the standard protocol without IV contrast. COMPARISON:  None. FINDINGS: Assessment for traumatic injury is limited in the absence of IV contrast. Lower chest: Subpleural reticulation in both lung bases. Heart is enlarged. No pleural or pericardial effusion. Hepatobiliary: No evidence of focal hepatic abnormality allowing for lack contrast. There is no perihepatic hematoma. Gallbladder physiologically distended, no calcified stone. No biliary dilatation. Pancreas: No evidence of injury. Fatty atrophy of the pancreas. No ductal dilatation or inflammation. Spleen: Limited assessment for injury in the absence of IV contrast. No focal splenic abnormality. No perisplenic hematoma. Adrenals/Urinary Tract: No adrenal hemorrhage. No evidence of renal injury. No hydronephrosis. No renal calculi. Urinary bladder is unremarkable without evidence of injury. Stomach/Bowel: No evidence of bowel injury or mesenteric hematoma. Decompressed stomach. No small bowel obstruction. No bowel wall thickening. Normal appendix courses posteriorly into the pelvis. Moderate volume of colonic stool. Diverticulosis in the sigmoid colon. No diverticulitis. Vascular/Lymphatic: Limited assessment for vascular injury in the absence of IV  contrast. Dense aortic and branch atherosclerosis. No retroperitoneal fluid or edema. There is no bulky abdominopelvic adenopathy. Reproductive: Status post hysterectomy. No adnexal masses. Other: There is no confluent body wall contusion. No free air or free fluid. Tiny fat containing umbilical hernia. Musculoskeletal: Lumbar spine assessed on concurrent lumbar spine reformats, reported separately. There is no pelvic fracture. Degenerative change of the pubic symphysis, both hips and sacroiliac joints. There are scattered bone islands in the pelvis. No fracture of the included lower ribs. IMPRESSION: 1. No evidence of acute traumatic injury to the abdomen or pelvis allowing for lack of IV contrast. 2. Colonic diverticulosis without diverticulitis. Aortic Atherosclerosis (ICD10-I70.0). Electronically Signed   By: Keith Rake M.D.   On: 08/03/2020 22:04   DG Thoracic Spine 2 View  Result Date: 08/03/2020 CLINICAL DATA:  Fall, back pain EXAM: THORACIC SPINE 2 VIEWS COMPARISON:  None. FINDINGS: Two view radiograph thoracic spine demonstrates normal thoracic kyphosis. No acute fracture or listhesis of the thoracic spine. Vertebral body height and intervertebral disc height have been preserved.  Mild endplate remodeling is seen within the mid and lower thoracic spine in keeping with changes of mild degenerative disc disease. Incidentally noted is a remote L1 compression deformity with approximately 20% loss of height. The paraspinal soft tissues are unremarkable. Note is made of an implanted loop recorder. IMPRESSION: No acute fracture or listhesis of the thoracic spine. Electronically Signed   By: Fidela Salisbury MD   On: 08/03/2020 20:22   DG Lumbar Spine 2-3 Views  Result Date: 08/03/2020 CLINICAL DATA:  Fall, low back pain EXAM: LUMBAR SPINE - 2-3 VIEW COMPARISON:  03/15/2020 FINDINGS: Five non rib bearing segments of the lumbar spine. Normal lumbar lordosis. Remote anterior wedge compression deformity  of L1 with approximately 20% loss of height is unchanged. No acute fracture or listhesis of the lumbar spine. Remaining vertebral body height has been preserved. There is diffuse intervertebral disc space narrowing and endplate remodeling throughout the lumbar spine, most severe at L4-5, in keeping with changes of moderate to severe degenerative disc disease. Facet arthrosis is noted throughout the lumbar spine, not well characterized on this examination. Atherosclerotic calcification is seen within the abdominal aorta. The paraspinal soft tissues are otherwise unremarkable. IMPRESSION: No acute fracture or listhesis of the lumbar spine. Stable mild L1 compression deformity. Diffuse moderate to severe degenerative disc and degenerative joint disease throughout the lumbar spine. Electronically Signed   By: Fidela Salisbury MD   On: 08/03/2020 20:20   DG Sacrum/Coccyx  Result Date: 08/03/2020 CLINICAL DATA:  Fall, back pain EXAM: SACRUM AND COCCYX - 2+ VIEW COMPARISON:  None. FINDINGS: There is no evidence of fracture or other focal bone lesions. IMPRESSION: Negative. Electronically Signed   By: Fidela Salisbury MD   On: 08/03/2020 20:20   CT L-SPINE NO CHARGE  Result Date: 08/03/2020 CLINICAL DATA:  Worsening back pain since fall on Sunday. EXAM: CT LUMBAR SPINE WITHOUT CONTRAST TECHNIQUE: Multidetector CT imaging of the lumbar spine was performed without intravenous contrast administration. Multiplanar CT image reconstructions were also generated. COMPARISON:  Lumbar MRI 04/20/2020, lumbar radiograph 02/24/2020 FINDINGS: Segmentation: 5 lumbar type vertebrae. Alignment: Normal. Vertebrae: Chronic compression fracture of L1, unchanged from prior imaging. Chronic anterior wedging of T12. No evidence of progression. The posterior elements are intact without acute fracture. Paraspinal and other soft tissues: Negative. Disc levels: Vacuum phenomena from L2-L3 through L5-S1 with so she aided disc space narrowing and  endplate spurring. Disc space narrowing is most severe at L4-L5 with prominent facet hypertrophy at this level. There is spinal canal and bilateral neural foraminal stenosis. Combination of facet hypertrophy and disc bulge at L3-L4 causes canal stenosis and neural foraminal narrowing. Disc bulge at L2-L3 also causes canal stenosis. These findings are similar to prior MRI. IMPRESSION: 1. No acute fracture or subluxation of the lumbar spine. 2. Chronic compression fractures of L1, unchanged from prior imaging. Chronic anterior wedging of T12 of also unchanged. 3. Multilevel degenerative disc disease and facet arthropathy with spinal canal stenosis at L2-L3, L3-L4 and L4-L5, similar to prior MRI. Electronically Signed   By: Keith Rake M.D.   On: 08/03/2020 22:10   CT NO CHARGE  Result Date: 08/03/2020 CLINICAL DATA:  Pelvic pain after fall. Hip and low back pain after mechanical fall. EXAM: CT ADDITIONAL VIEWS AT NO CHARGE PELVIS CT WITHOUT CONTRAST TECHNIQUE: Multiplanar CT images of the pelvis were reconstructed from contemporary CT of the Abdomen and Pelvis. COMPARISON:  Radiographs earlier today FINDINGS: Cortical margins of the pelvis are intact. No acute fracture.  The sacral ala are maintained. Mild degenerative change of both sacroiliac joints which are congruent. Moderate degenerative change of the pubic symphysis. Femoral heads are well seated in the respective acetabula, no proximal femur fractures. Pubic rami are intact. Soft tissues assessed on concurrent abdominopelvic CT. IMPRESSION: 1. No evidence of acute pelvic fracture. 2. If there is persistent clinical concern for fracture, consider further evaluation with MRI. Electronically Signed   By: Keith Rake M.D.   On: 08/03/2020 23:40   DG Hip Unilat W or Wo Pelvis 2-3 Views Right  Result Date: 08/03/2020 CLINICAL DATA:  Fall, right hip pain EXAM: DG HIP (WITH OR WITHOUT PELVIS) 2-3V RIGHT COMPARISON:  None. FINDINGS: Single view  radiograph pelvis and two view radiograph of the right hip demonstrates normal alignment. No fracture or dislocation. Mild bilateral degenerative hip arthritis is present with asymmetric joint space narrowing. Vascular calcifications are seen within the pelvis. Degenerative changes are seen within the lower lumbar spine, not well characterized on this examination. IMPRESSION: No acute fracture or dislocation. Electronically Signed   By: Fidela Salisbury MD   On: 08/03/2020 20:16        Scheduled Meds: . aspirin EC  81 mg Oral Daily  . atorvastatin  80 mg Oral q1800  . clopidogrel  75 mg Oral Daily  . cyclobenzaprine  5 mg Oral TID  . docusate sodium  100 mg Oral BID  . donepezil  5 mg Oral QHS  . enoxaparin (LOVENOX) injection  50 mg Subcutaneous Q24H  . gabapentin  100 mg Oral TID  . HYDROcodone-acetaminophen  1-2 tablet Oral Q6H  . levothyroxine  75 mcg Oral Q0600  . lidocaine  3 patch Transdermal Q24H  . losartan  25 mg Oral Daily  . metoprolol succinate  100 mg Oral Daily  . venlafaxine XR  150 mg Oral Q breakfast  . [START ON 08/05/2020] Vitamin D (Ergocalciferol)  50,000 Units Oral Q Fri   Continuous Infusions:   LOS: 0 days    Time spent: More than 50% of that time was spent in counseling and/or coordination of care.      Shelly Coss, MD [facility] P12/16/2021, 12:49 PM

## 2020-08-05 DIAGNOSIS — M5441 Lumbago with sciatica, right side: Secondary | ICD-10-CM | POA: Diagnosis not present

## 2020-08-05 DIAGNOSIS — M5459 Other low back pain: Secondary | ICD-10-CM | POA: Diagnosis not present

## 2020-08-05 MED ORDER — ALBUTEROL SULFATE HFA 108 (90 BASE) MCG/ACT IN AERS: 2.0000 | INHALATION_SPRAY | Freq: Four times a day (QID) | RESPIRATORY_TRACT | 1 refills | Status: DC | PRN

## 2020-08-05 MED ORDER — HYDROCODONE-ACETAMINOPHEN 5-325 MG PO TABS: 1.0000 | ORAL_TABLET | Freq: Two times a day (BID) | ORAL | 0 refills | Status: DC | PRN

## 2020-08-05 NOTE — TOC Transition Note (Signed)
Transition of Care [facility]) - CM/SW Discharge Note   Patient Details  Name: [name] Douglas MRN: [id number] Date of Birth: [birth year]  Transition of Care [facility]) CM/SW Contact:  Magnus Ivan, LCSW Phone Number: 08/05/2020, 11:12 AM   Clinical Narrative:   Per MD patient to discharge home today. Cheryl with [facility] notified. They are following patient for HHPT. Patient reported she is aware of plan to discharge and said her daughter will pick her up at time of discharge. Denied additional needs prior to discharge.    Final next level of care: [place] Barriers to Discharge: Barriers Resolved   Patient Goals and CMS Choice Patient states their goals for this hospitalization and ongoing recovery are:: to return home with home health CMS Medicare.gov Compare Post Acute Care list provided to:: Patient Choice offered to / list presented to : Patient  Discharge Placement                Patient to be transferred to facility by: daughter Name of family member notified: patient aware Patient and family notified of of transfer: 08/05/20  Discharge Plan and Services                          HH Arranged: PT [facility] Agency: [place] Date [place]: 08/05/20   Representative spoke with at [place]: [place] Determinants of Health ([place]) Interventions     Readmission Risk Interventions No flowsheet data found.

## 2020-08-05 NOTE — Discharge Summary (Signed)
Physician Discharge Summary  [name] Douglas HCW:[id number] DOB: 1940-12-11 DOA: 08/03/2020  PCP: Lavera Guise, MD  Admit date: 08/03/2020 Discharge date: 08/05/2020  Admitted From: Home Disposition:  Home  Discharge Condition:Stable CODE STATUS:FULL Diet recommendation: Heart Healthy  Brief/Interim Summary: Patient is a 79 year old  female with history of anxiety, asthma, COPD, hypertension, hyperlipidemia, hypothyroidism, chronic low back pain from L1 vertebral fracture, recent stroke without residual deficits who presented to the emergency department for the evaluation of back pain after she sustained a mechanical fall landing on her right hip.  Extensive skeletal survey done on presentation did not show any fracture or dislocation.  PT/OT recommended home health on discharge. She is medically stable for discharge home today.  Following problems were addressed during hospitalization:  Intractable right hip/low back pain: Status post mechanical fall.  Continue supportive care, analgesics.  PT/OT recommended home health on discharge. Extensive skeletal survey did not show any new fracture or dislocation.  Imaging showed a stable L1 compression deformity, moderately severe degenerative takes disease.  She follows with [facility] neurosurgery and is being planned for surgery for L1 compression fracture.  History of CVA: On aspirin, Plavix which we will continue on DC  Hypothyroidism: Continue Synthyroid  Hypertension: Currently on metoprolol.    Hyperlipidemia: Continue Lipitor  History of anxiety/depression: Continue Effexor  History of memory deficits: Continue Aricept.  Supportive care.    Discharge Diagnoses:  Active Problems:   Intractable low back pain    Discharge Instructions  Discharge Instructions    Diet - low sodium heart healthy   Complete by: As directed    Discharge instructions   Complete by: As directed    1)Please take prescribed medications as  instructed 2)Follow up with your PCP in a week. Follow-up with your neurosurgeon on the given appointment date 3)Follow up with home health   Increase activity slowly   Complete by: As directed      Allergies as of 08/05/2020      Reactions   Heparin Nausea And Vomiting      Medication List    TAKE these medications   albuterol 108 (90 Base) MCG/ACT inhaler Commonly known as: VENTOLIN HFA Inhale 2 puffs into the lungs every 6 (six) hours as needed for wheezing or shortness of breath.   aspirin EC 81 MG tablet Take 81 mg by mouth daily. Swallow whole.   atorvastatin 80 MG tablet Commonly known as: LIPITOR Take 1 tablet (80 mg total) by mouth daily at 6 PM.   clopidogrel 75 MG tablet Commonly known as: PLAVIX Take 1 tablet (75 mg total) by mouth daily.   cyclobenzaprine 5 MG tablet Commonly known as: FLEXERIL Take 1 tablet (5 mg total) by mouth at bedtime.   docusate sodium 100 MG capsule Commonly known as: COLACE Take 100 mg by mouth daily as needed for mild constipation.   donepezil 5 MG tablet Commonly known as: ARICEPT Take 1 tablet (5 mg total) by mouth at bedtime.   ergocalciferol 1.25 MG (50000 UT) capsule Commonly known as: Drisdol Take 1 capsule (50,000 Units total) by mouth every Friday.   gabapentin 100 MG capsule Commonly known as: NEURONTIN Take 100 mg by mouth 3 (three) times daily.   GOODY HEADACHE PO Take 1 packet by mouth daily as needed (headaches/pain).   HYDROcodone-acetaminophen 5-325 MG tablet Commonly known as: NORCO/VICODIN Take 1-2 tablets by mouth 2 (two) times daily as needed for severe pain.   levothyroxine 75 MCG tablet Commonly known  as: SYNTHROID Take 75 mcg by mouth daily before breakfast.   losartan 25 MG tablet Commonly known as: Cozaar Take 1 tablet (25 mg total) by mouth daily.   metoprolol succinate 50 MG 24 hr tablet Commonly known as: TOPROL-XL Take 2 tablets (100 mg total) by mouth daily.   venlafaxine XR 75 MG  24 hr capsule Commonly known as: EFFEXOR-XR TAKE 2 CAPSULES BY MOUTH DAILY. What changed:   how much to take  how to take this  when to take this  additional instructions       Follow-up Information    Lavera Guise, MD. Schedule an appointment as soon as possible for a visit in 1 week(s).   Specialty: Internal Medicine Contact information: [id number] [place]  [phone]              Allergies  Allergen Reactions  . Heparin Nausea And Vomiting    Consultations:  None   Procedures/Studies: CT ABDOMEN PELVIS WO CONTRAST  Result Date: 08/03/2020 CLINICAL DATA:  Abdominal trauma. Patient reports worsening low back pain since fall on Sunday. EXAM: CT ABDOMEN AND PELVIS WITHOUT CONTRAST TECHNIQUE: Multidetector CT imaging of the abdomen and pelvis was performed following the standard protocol without IV contrast. COMPARISON:  None. FINDINGS: Assessment for traumatic injury is limited in the absence of IV contrast. Lower chest: Subpleural reticulation in both lung bases. Heart is enlarged. No pleural or pericardial effusion. Hepatobiliary: No evidence of focal hepatic abnormality allowing for lack contrast. There is no perihepatic hematoma. Gallbladder physiologically distended, no calcified stone. No biliary dilatation. Pancreas: No evidence of injury. Fatty atrophy of the pancreas. No ductal dilatation or inflammation. Spleen: Limited assessment for injury in the absence of IV contrast. No focal splenic abnormality. No perisplenic hematoma. Adrenals/Urinary Tract: No adrenal hemorrhage. No evidence of renal injury. No hydronephrosis. No renal calculi. Urinary bladder is unremarkable without evidence of injury. Stomach/Bowel: No evidence of bowel injury or mesenteric hematoma. Decompressed stomach. No small bowel obstruction. No bowel wall thickening. Normal appendix courses posteriorly into the pelvis. Moderate volume of colonic stool. Diverticulosis in the  sigmoid colon. No diverticulitis. Vascular/Lymphatic: Limited assessment for vascular injury in the absence of IV contrast. Dense aortic and branch atherosclerosis. No retroperitoneal fluid or edema. There is no bulky abdominopelvic adenopathy. Reproductive: Status post hysterectomy. No adnexal masses. Other: There is no confluent body wall contusion. No free air or free fluid. Tiny fat containing umbilical hernia. Musculoskeletal: Lumbar spine assessed on concurrent lumbar spine reformats, reported separately. There is no pelvic fracture. Degenerative change of the pubic symphysis, both hips and sacroiliac joints. There are scattered bone islands in the pelvis. No fracture of the included lower ribs. IMPRESSION: 1. No evidence of acute traumatic injury to the abdomen or pelvis allowing for lack of IV contrast. 2. Colonic diverticulosis without diverticulitis. Aortic Atherosclerosis (ICD10-I70.0). Electronically Signed   By: Keith Rake M.D.   On: 08/03/2020 22:04   DG Thoracic Spine 2 View  Result Date: 08/03/2020 CLINICAL DATA:  Fall, back pain EXAM: THORACIC SPINE 2 VIEWS COMPARISON:  None. FINDINGS: Two view radiograph thoracic spine demonstrates normal thoracic kyphosis. No acute fracture or listhesis of the thoracic spine. Vertebral body height and intervertebral disc height have been preserved. Mild endplate remodeling is seen within the mid and lower thoracic spine in keeping with changes of mild degenerative disc disease. Incidentally noted is a remote L1 compression deformity with approximately 20% loss of height. The paraspinal soft tissues are unremarkable. Note  is made of an implanted loop recorder. IMPRESSION: No acute fracture or listhesis of the thoracic spine. Electronically Signed   By: Fidela Salisbury MD   On: 08/03/2020 20:22   DG Lumbar Spine 2-3 Views  Result Date: 08/03/2020 CLINICAL DATA:  Fall, low back pain EXAM: LUMBAR SPINE - 2-3 VIEW COMPARISON:  03/15/2020 FINDINGS: Five  non rib bearing segments of the lumbar spine. Normal lumbar lordosis. Remote anterior wedge compression deformity of L1 with approximately 20% loss of height is unchanged. No acute fracture or listhesis of the lumbar spine. Remaining vertebral body height has been preserved. There is diffuse intervertebral disc space narrowing and endplate remodeling throughout the lumbar spine, most severe at L4-5, in keeping with changes of moderate to severe degenerative disc disease. Facet arthrosis is noted throughout the lumbar spine, not well characterized on this examination. Atherosclerotic calcification is seen within the abdominal aorta. The paraspinal soft tissues are otherwise unremarkable. IMPRESSION: No acute fracture or listhesis of the lumbar spine. Stable mild L1 compression deformity. Diffuse moderate to severe degenerative disc and degenerative joint disease throughout the lumbar spine. Electronically Signed   By: Fidela Salisbury MD   On: 08/03/2020 20:20   DG Sacrum/Coccyx  Result Date: 08/03/2020 CLINICAL DATA:  Fall, back pain EXAM: SACRUM AND COCCYX - 2+ VIEW COMPARISON:  None. FINDINGS: There is no evidence of fracture or other focal bone lesions. IMPRESSION: Negative. Electronically Signed   By: Fidela Salisbury MD   On: 08/03/2020 20:20   MR BRAIN WO CONTRAST  Result Date: 07/15/2020 CLINICAL DATA:  Stroke follow-up. EXAM: MRI HEAD WITHOUT CONTRAST TECHNIQUE: Multiplanar, multiecho pulse sequences of the brain and surrounding structures were obtained without intravenous contrast. COMPARISON:  CT head July 14, 2020. FINDINGS: Brain: Focus of susceptibility artifact and T1 hyperintensity within the left pons, compatible with a focus of hemorrhage when correlating with recent CT head. There is mild associated edema without significant mass effect. Acute blood products limit evaluation for underlying infarct in this region. Otherwise, no evidence of acute infarct. Remote right parietal infarct with  encephalomalacia and surrounding gliosis. No hydrocephalus. No mass lesion or midline shift. Scattered T2/FLAIR hyperintensities within the white matter, compatible with chronic microvascular ischemic disease. Generalized cerebral atrophy with ex vacuo ventricular dilation. Vascular: Major arterial flow voids are maintained at the skull base. Skull and upper cervical spine: Normal marrow signal. Sinuses/Orbits: Visualized sinuses are clear.  Unremarkable orbits. Other: No mastoid effusions. IMPRESSION: 1. Acute/subacute left pontine hemorrhage, similar in size when comparing across modalities. This could represent a spontaneous hemorrhage (potentially hypertensive) or hemorrhagic conversion of a lacunar infarct with acute blood products limiting evaluation. Mild associated edema without substantial mass effect. No hydrocephalus. 2. Chronic microvascular ischemic disease and remote right parietal infarct. Electronically Signed   By: Margaretha Sheffield MD   On: 07/15/2020 09:59   US Carotid Bilateral (at [facility] and [facility] only)  Result Date: 07/15/2020 CLINICAL DATA:  79 year old female with history of stroke. EXAM: BILATERAL CAROTID DUPLEX ULTRASOUND TECHNIQUE: Pearline Cables scale imaging, color Doppler and duplex ultrasound were performed of bilateral carotid and vertebral arteries in the neck. COMPARISON:  12/20/2017 FINDINGS: Criteria: Quantification of carotid stenosis is based on velocity parameters that correlate the residual internal carotid diameter with NASCET-based stenosis levels, using the diameter of the distal internal carotid lumen as the denominator for stenosis measurement. The following velocity measurements were obtained: RIGHT ICA: Peak systolic velocity 95 cm/sec, End diastolic velocity 70 cm/sec CCA: Peak systolic velocity 88 cm/sec SYSTOLIC  ICA/CCA RATIO:  1.1 ECA: Peak systolic velocity 583 cm/sec LEFT ICA: Peak systolic velocity 094 cm/sec, End diastolic velocity 16 cm/sec CCA: 076 cm/sec SYSTOLIC  ICA/CCA RATIO:  0.8 ECA: 173 cm/sec RIGHT CAROTID ARTERY: Moderate multifocal atherosclerotic plaque formation about the carotid bulb and bifurcation. No significant tortuosity. Low resistance waveforms which demonstrate pulsus bisferiens. RIGHT VERTEBRAL ARTERY:  Antegrade flow. LEFT CAROTID ARTERY: Moderate multifocal atherosclerotic plaque formation in the distal CCA, bulb and bifurcation, and into the ICA. No significant tortuosity. Low resistance waveforms which demonstrate pulsus bisferiens. LEFT VERTEBRAL ARTERY:  Antegrade flow. Upper extremity non-invasive blood pressures: Not obtained. IMPRESSION: 1. Right carotid artery system: Less than 50% stenosis secondary to moderate multifocal atherosclerotic plaque formation. 2. Left carotid artery system: Less than 50% stenosis secondary to moderate multifocal atherosclerotic plaque formation. 3. Pulsus bisferiens waveform visualized in the bilateral carotid arteries as could be seen with aortic valvulopathy. 4.  Vertebral artery system: Patent with antegrade flow bilaterally. Ruthann Cancer, MD [facility] Electronically Signed   By: Ruthann Cancer MD   On: 07/15/2020 12:13   CT L-SPINE NO CHARGE  Result Date: 08/03/2020 CLINICAL DATA:  Worsening back pain since fall on Sunday. EXAM: CT LUMBAR SPINE WITHOUT CONTRAST TECHNIQUE: Multidetector CT imaging of the lumbar spine was performed without intravenous contrast administration. Multiplanar CT image reconstructions were also generated. COMPARISON:  Lumbar MRI 04/20/2020, lumbar radiograph 02/24/2020 FINDINGS: Segmentation: 5 lumbar type vertebrae. Alignment: Normal. Vertebrae: Chronic compression fracture of L1, unchanged from prior imaging. Chronic anterior wedging of T12. No evidence of progression. The posterior elements are intact without acute fracture. Paraspinal and other soft tissues: Negative. Disc levels: Vacuum phenomena from L2-L3 through  L5-S1 with so she aided disc space narrowing and endplate spurring. Disc space narrowing is most severe at L4-L5 with prominent facet hypertrophy at this level. There is spinal canal and bilateral neural foraminal stenosis. Combination of facet hypertrophy and disc bulge at L3-L4 causes canal stenosis and neural foraminal narrowing. Disc bulge at L2-L3 also causes canal stenosis. These findings are similar to prior MRI. IMPRESSION: 1. No acute fracture or subluxation of the lumbar spine. 2. Chronic compression fractures of L1, unchanged from prior imaging. Chronic anterior wedging of T12 of also unchanged. 3. Multilevel degenerative disc disease and facet arthropathy with spinal canal stenosis at L2-L3, L3-L4 and L4-L5, similar to prior MRI. Electronically Signed   By: Melanie  Sanford M.D.   On: 08/03/2020 22:10   ECHOCARDIOGRAM COMPLETE  Result Date: 07/15/2020    ECHOCARDIOGRAM REPORT   Patient Name:   Delani F Felch Date of Exam: 07/15/2020 Medical Rec #:  [id number]       Height:       66.0 in Accession #:    [id number]      Weight:       210.7 lb Date of Birth:  08/03/1941        BSA:          2.045 m Patient Age:    79 years        BP:           152/47 mmHg Patient Gender: F               HR:           61  bpm. Exam Location:  [facility] Procedure: 2D Echo, Color Doppler and Cardiac Doppler Indications:     Stroke 434.91  History:  Patient has prior history of Echocardiogram examinations, most                  recent 12/20/2017. COPD and Stroke; Risk Factors:Hypertension.  Sonographer:     Sherrie Sport RDCS (AE) Referring Phys:  [place] Diagnosing Phys: Kate Sable MD  Sonographer Comments: No apical window and no subcostal window. Image acquisition challenging due to COPD. IMPRESSIONS  1. Left ventricular ejection fraction, by estimation, is 55 to 60%. The left ventricle has normal function. The left ventricle has no regional wall motion abnormalities. Left ventricular diastolic function  could not be evaluated.  2. Right ventricular systolic function is low normal. The right ventricular size is not well visualized.  3. The mitral valve is degenerative. No evidence of mitral valve regurgitation.  4. The aortic valve is calcified. Aortic valve regurgitation is mild to moderate. Mild aortic valve sclerosis is present, with no evidence of aortic valve stenosis. FINDINGS  Left Ventricle: Left ventricular ejection fraction, by estimation, is 55 to 60%. The left ventricle has normal function. The left ventricle has no regional wall motion abnormalities. The left ventricular internal cavity size was normal in size. There is  no left ventricular hypertrophy. Left ventricular diastolic function could not be evaluated. Right Ventricle: The right ventricular size is not well visualized. No increase in right ventricular wall thickness. Right ventricular systolic function is low normal. Left Atrium: Left atrial size was normal in size. Right Atrium: Right atrial size was not well visualized. Pericardium: There is no evidence of pericardial effusion. Mitral Valve: The mitral valve is degenerative in appearance. No evidence of mitral valve regurgitation. Tricuspid Valve: The tricuspid valve is normal in structure. Tricuspid valve regurgitation is trivial. Aortic Valve: The aortic valve is calcified. Aortic valve regurgitation is mild to moderate. Mild aortic valve sclerosis is present, with no evidence of aortic valve stenosis. Pulmonic Valve: The pulmonic valve was not well visualized. Pulmonic valve regurgitation is not visualized. Aorta: The aortic root is normal in size and structure. Venous: The inferior vena cava was not well visualized. IAS/Shunts: No atrial level shunt detected by color flow Doppler.  LEFT VENTRICLE PLAX 2D LVIDd:         4.62 cm LVIDs:         3.35 cm LV PW:         1.50 cm LV IVS:        1.31 cm LVOT diam:     2.10 cm LVOT Area:     3.46 cm  LEFT ATRIUM         Index LA diam:    3.80 cm  1.86 cm/m                        PULMONIC VALVE AORTA                 PV Vmax:        0.55 m/s Ao Root diam: 2.70 cm PV Peak grad:   1.2 mmHg                       RVOT Peak grad: 2 mmHg   SHUNTS Systemic Diam: 2.10 cm Kate Sable MD Electronically signed by Kate Sable MD Signature Date/Time: 07/15/2020/2:27:18 PM    Final    CT NO CHARGE  Result Date: 08/03/2020 CLINICAL DATA:  Pelvic pain after fall. Hip and low back pain after mechanical fall. EXAM: CT ADDITIONAL  VIEWS AT NO CHARGE PELVIS CT WITHOUT CONTRAST TECHNIQUE: Multiplanar CT images of the pelvis were reconstructed from contemporary CT of the Abdomen and Pelvis. COMPARISON:  Radiographs earlier today FINDINGS: Cortical margins of the pelvis are intact. No acute fracture. The sacral ala are maintained. Mild degenerative change of both sacroiliac joints which are congruent. Moderate degenerative change of the pubic symphysis. Femoral heads are well seated in the respective acetabula, no proximal femur fractures. Pubic rami are intact. Soft tissues assessed on concurrent abdominopelvic CT. IMPRESSION: 1. No evidence of acute pelvic fracture. 2. If there is persistent clinical concern for fracture, consider further evaluation with MRI. Electronically Signed   By: Keith Rake M.D.   On: 08/03/2020 23:40   DG Hip Unilat W or Wo Pelvis 2-3 Views Right  Result Date: 08/03/2020 CLINICAL DATA:  Fall, right hip pain EXAM: DG HIP (WITH OR WITHOUT PELVIS) 2-3V RIGHT COMPARISON:  None. FINDINGS: Single view radiograph pelvis and two view radiograph of the right hip demonstrates normal alignment. No fracture or dislocation. Mild bilateral degenerative hip arthritis is present with asymmetric joint space narrowing. Vascular calcifications are seen within the pelvis. Degenerative changes are seen within the lower lumbar spine, not well characterized on this examination. IMPRESSION: No acute fracture or dislocation. Electronically Signed   By:  Fidela Salisbury MD   On: 08/03/2020 20:16   CT HEAD CODE STROKE WO CONTRAST  Result Date: 07/14/2020 CLINICAL DATA:  Code stroke. Episodic weakness today. Hypertensive. Blurred vision. EXAM: CT HEAD WITHOUT CONTRAST TECHNIQUE: Contiguous axial images were obtained from the base of the skull through the vertex without intravenous contrast. COMPARISON:  11/06/2019 FINDINGS: Brain: Newly seen 4 mm focus of hyperdensity in the left para median pons could be a lacunar infarction with petechial blood. No focal cerebellar finding. Cerebral hemispheres show chronic small-vessel disease of the white matter and an old infarction in the right parietal cortical and subcortical brain. No acute finding in the cerebral hemispheres. No mass, hydrocephalus or extra-axial collection. Vascular: There is atherosclerotic calcification of the major vessels at the base of the brain. Skull: Negative Sinuses/Orbits: Clear/normal Other: None ASPECTS ([place] Stroke Program Early CT Score) - Ganglionic level infarction (caudate, lentiform nuclei, internal capsule, insula, M1-M3 cortex): 7 - Supraganglionic infarction (M4-M6 cortex): 3 Total score (0-10 with 10 being normal): 10 IMPRESSION: 1. Newly seen 4 mm focus of hyperdensity in the left para median pons could be a lacunar infarction with petechial blood. 2. ASPECTS is 10. 3. Chronic small-vessel disease of the white matter. Old right parietal cortical and subcortical infarction. 4. These results were called by telephone at the time of interpretation on 07/14/2020 at 5:29 pm to provider Lavonia Drafts , who verbally acknowledged these results. Electronically Signed   By: Nelson Chimes M.D.   On: 07/14/2020 17:31       Subjective: Patient seen and examined at the bedside this morning. Hemodynamically stable for discharge.  Discharge Exam: Vitals:   08/05/20 0422 08/05/20 0743  BP: (!) 148/52 (!) 146/65  Pulse: 60 69  Resp:  18  Temp:  98.3 F (36.8 C)  SpO2: 90% (!) 82%    Vitals:   08/04/20 2253 08/05/20 0420 08/05/20 0422 08/05/20 0743  BP: (!) 167/52 (!) 145/63 (!) 148/52 (!) 146/65  Pulse: 62 60 60 69  Resp: 18 18  18   Temp: 97.6 F (36.4 C) 97.6 F (36.4 C)  98.3 F (36.8 C)  TempSrc:    Oral  SpO2: 96% 92% 90% Marland Kitchen)  82%  Weight:      Height:        General: Pt is alert, awake, not in acute distress Cardiovascular: RRR, S1/S2 +, no rubs, no gallops Respiratory: CTA bilaterally, no wheezing, no rhonchi Abdominal: Soft, NT, ND, bowel sounds + Extremities: no edema, no cyanosis    The results of significant diagnostics from this hospitalization (including imaging, microbiology, ancillary and laboratory) are listed below for reference.     Microbiology: Recent Results (from the past 240 hour(s))  Resp Panel by RT-PCR (Flu A&B, Covid) Nasopharyngeal Swab     Status: None   Collection Time: 08/03/20  8:56 PM   Specimen: Nasopharyngeal Swab; Nasopharyngeal(NP) swabs in vial transport medium  Result Value Ref Range Status   SARS Coronavirus 2 by RT PCR NEGATIVE NEGATIVE Final    Comment: (NOTE) SARS-CoV-2 target nucleic acids are NOT DETECTED.  The SARS-CoV-2 RNA is generally detectable in upper respiratory specimens during the acute phase of infection. The lowest concentration of SARS-CoV-2 viral copies this assay can detect is 138 copies/mL. A negative result does not preclude SARS-Cov-2 infection and should not be used as the sole basis for treatment or other patient management decisions. A negative result may occur with  improper specimen collection/handling, submission of specimen other than nasopharyngeal swab, presence of viral mutation(s) within the areas targeted by this assay, and inadequate number of viral copies(<138 copies/mL). A negative result must be combined with clinical observations, patient history, and epidemiological information. The expected result is Negative.  Fact Sheet for Patients:   EntrepreneurPulse.com.au  Fact Sheet for Healthcare Providers:  IncredibleEmployment.be  This test is no t yet approved or cleared by the [place] FDA and  has been authorized for detection and/or diagnosis of SARS-CoV-2 by FDA under an Emergency Use Authorization (EUA). This EUA will remain  in effect (meaning this test can be used) for the duration of the COVID-19 declaration under Section 564(b)(1) of the Act, 21 U.S.C.section 360bbb-3(b)(1), unless the authorization is terminated  or revoked sooner.       Influenza A by PCR NEGATIVE NEGATIVE Final   Influenza B by PCR NEGATIVE NEGATIVE Final    Comment: (NOTE) The Xpert Xpress SARS-CoV-2/FLU/RSV plus assay is intended as an aid in the diagnosis of influenza from Nasopharyngeal swab specimens and should not be used as a sole basis for treatment. Nasal washings and aspirates are unacceptable for Xpert Xpress SARS-CoV-2/FLU/RSV testing.  Fact Sheet for Patients: EntrepreneurPulse.com.au  Fact Sheet for Healthcare Providers: IncredibleEmployment.be  This test is not yet approved or cleared by the [place] FDA and has been authorized for detection and/or diagnosis of SARS-CoV-2 by FDA under an Emergency Use Authorization (EUA). This EUA will remain in effect (meaning this test can be used) for the duration of the COVID-19 declaration under Section 564(b)(1) of the Act, 21 U.S.C. section 360bbb-3(b)(1), unless the authorization is terminated or revoked.  Performed at [id number], [address] [place], [place] [address]      Labs: BNP (last 3 results) No results for input(s): BNP in the last 8760 hours. Basic Metabolic Panel: Recent Labs  Lab 08/03/20 2056 08/04/20 0037 08/04/20 0521  NA 141  --  144  K 3.7  --  3.8  CL 104  --  107  CO2 26  --  30  GLUCOSE 95  --  110*  BUN 14  --  18  CREATININE 0.66 0.79 0.85   CALCIUM 9.0  --  9.4  Liver Function Tests: Recent Labs  Lab 08/03/20 2056 08/04/20 0521  AST 16 13*  ALT 11 10  ALKPHOS 56 52  BILITOT 0.6 0.3  PROT 6.7 6.2*  ALBUMIN 4.1 3.7   No results for input(s): LIPASE, AMYLASE in the last 168 hours. No results for input(s): AMMONIA in the last 168 hours. CBC: Recent Labs  Lab 08/03/20 2056 08/04/20 0037 08/04/20 0521  WBC 7.7 7.0 7.0  NEUTROABS 4.9  --   --   HGB 11.7* 11.3* 10.6*  HCT 35.2* 34.0* 32.3*  MCV 92.6 92.6 93.1  PLT 252 255 247   Cardiac Enzymes: No results for input(s): CKTOTAL, CKMB, CKMBINDEX, TROPONINI in the last 168 hours. BNP: Invalid input(s): POCBNP CBG: No results for input(s): GLUCAP in the last 168 hours. D-Dimer No results for input(s): DDIMER in the last 72 hours. Hgb A1c No results for input(s): HGBA1C in the last 72 hours. Lipid Profile No results for input(s): CHOL, HDL, LDLCALC, TRIG, CHOLHDL, LDLDIRECT in the last 72 hours. Thyroid function studies No results for input(s): TSH, T4TOTAL, T3FREE, THYROIDAB in the last 72 hours.  Invalid input(s): FREET3 Anemia work up No results for input(s): VITAMINB12, FOLATE, FERRITIN, TIBC, IRON, RETICCTPCT in the last 72 hours. Urinalysis    Component Value Date/Time   COLORURINE STRAW (A) 12/20/2017 1250   APPEARANCEUR Clear 03/31/2019 1459   LABSPEC 1.004 (L) 12/20/2017 1250   LABSPEC 1.035 08/02/2014 2331   PHURINE 6.0 12/20/2017 1250   GLUCOSEU CANCELED 04/07/2020 1639   GLUCOSEU Negative 08/02/2014 2331   HGBUR NEGATIVE 12/20/2017 1250   BILIRUBINUR Moderate 03/15/2020 1416   BILIRUBINUR Negative 03/31/2019 1459   BILIRUBINUR Negative 08/02/2014 2331   KETONESUR NEGATIVE 12/20/2017 1250   PROTEINUR CANCELED 04/07/2020 1639   PROTEINUR NEGATIVE 12/20/2017 1250   UROBILINOGEN 0.2 03/15/2020 1416   UROBILINOGEN 1.0 08/20/2014 2125   NITRITE Negative 03/15/2020 1416   NITRITE Negative 03/31/2019 1459   NITRITE NEGATIVE 12/20/2017 1250    LEUKOCYTESUR Trace (A) 03/15/2020 1416   LEUKOCYTESUR Negative 03/31/2019 1459   LEUKOCYTESUR Negative 08/02/2014 2331   Sepsis Labs Invalid input(s): PROCALCITONIN,  WBC,  LACTICIDVEN Microbiology Recent Results (from the past 240 hour(s))  Resp Panel by RT-PCR (Flu A&B, Covid) Nasopharyngeal Swab     Status: None   Collection Time: 08/03/20  8:56 PM   Specimen: Nasopharyngeal Swab; Nasopharyngeal(NP) swabs in vial transport medium  Result Value Ref Range Status   SARS Coronavirus 2 by RT PCR NEGATIVE NEGATIVE Final    Comment: (NOTE) SARS-CoV-2 target nucleic acids are NOT DETECTED.  The SARS-CoV-2 RNA is generally detectable in upper respiratory specimens during the acute phase of infection. The lowest concentration of SARS-CoV-2 viral copies this assay can detect is 138 copies/mL. A negative result does not preclude SARS-Cov-2 infection and should not be used as the sole basis for treatment or other patient management decisions. A negative result may occur with  improper specimen collection/handling, submission of specimen other than nasopharyngeal swab, presence of viral mutation(s) within the areas targeted by this assay, and inadequate number of viral copies(<138 copies/mL). A negative result must be combined with clinical observations, patient history, and epidemiological information. The expected result is Negative.  Fact Sheet for Patients:  EntrepreneurPulse.com.au  Fact Sheet for Healthcare Providers:  IncredibleEmployment.be  This test is no t yet approved or cleared by the [place] FDA and  has been authorized for detection and/or diagnosis of SARS-CoV-2 by FDA under an Emergency Use Authorization (EUA). This EUA will  remain  in effect (meaning this test can be used) for the duration of the COVID-19 declaration under Section 564(b)(1) of the Act, 21 U.S.C.section 360bbb-3(b)(1), unless the authorization is terminated   or revoked sooner.       Influenza A by PCR NEGATIVE NEGATIVE Final   Influenza B by PCR NEGATIVE NEGATIVE Final    Comment: (NOTE) The Xpert Xpress SARS-CoV-2/FLU/RSV plus assay is intended as an aid in the diagnosis of influenza from Nasopharyngeal swab specimens and should not be used as a sole basis for treatment. Nasal washings and aspirates are unacceptable for Xpert Xpress SARS-CoV-2/FLU/RSV testing.  Fact Sheet for Patients: EntrepreneurPulse.com.au  Fact Sheet for Healthcare Providers: IncredibleEmployment.be  This test is not yet approved or cleared by the [place] FDA and has been authorized for detection and/or diagnosis of SARS-CoV-2 by FDA under an Emergency Use Authorization (EUA). This EUA will remain in effect (meaning this test can be used) for the duration of the COVID-19 declaration under Section 564(b)(1) of the Act, 21 U.S.C. section 360bbb-3(b)(1), unless the authorization is terminated or revoked.  Performed at [facility], [address] [place], [place] [address]     Please note: You were cared for by a hospitalist during your hospital stay. Once you are discharged, your primary care physician will handle any further medical issues. Please note that NO REFILLS for any discharge medications will be authorized once you are discharged, as it is imperative that you return to your primary care physician (or establish a relationship with a primary care physician if you do not have one) for your post hospital discharge needs so that they can reassess your need for medications and monitor your lab values.    Time coordinating discharge: 40 minutes  SIGNED:   Shelly Coss, MD  [facility] 08/05/2020, 11:22 AM Pager [phone]  If 7PM-7AM, please contact night-coverage [email or web address] Password [email or web address]

## 2020-08-05 NOTE — Progress Notes (Addendum)
Physical Therapy Treatment Patient Details Name: [name] Douglas MRN: [id number] DOB: 03/19/1941 Today's Date: 08/05/2020    History of Present Illness 79 year old female with PMH stroke, essential hypertension, COPD, chronic back pain.  Here after fall at home with R hip and back pain.  of note: She was here 1 month ago with sudden onset of blurred vision and lightheadedness; admitted for TIA/CVA work up.  Imaging significant for L pons hemorrhage, stable between images.    PT Comments    Pt was supine in bed upon arriving. She agrees to PT session and is cooperative and pleasant throughout. Continues to have severe back pain in wt bearing positions. She required some assistance to exit bed, stood to RW with CGA, and ambulated 60 ft with slow antalgic step to pattern. Limited distance 2/2 to pain. She was able to perform ascending/descending 4 stair safely. Called pt's daughter who states she and grand-daughter will provide 24 hr assist at DC. Recommend [place] services to maximize independence.     Follow Up Recommendations  Home health PT;Supervision/Assistance - 24 hour     Equipment Recommendations  Other (comment) (pt has all equipment needs met.)    Recommendations for Other Services       Precautions / Restrictions Precautions Precautions: Fall Restrictions Weight Bearing Restrictions: No    Mobility  Bed Mobility Overal bed mobility: Needs Assistance Bed Mobility: Sidelying to Sit;Supine to Sit;Sit to Supine   Sidelying to sit: Min assist Supine to sit: Min assist     General bed mobility comments: Min assist to exit  Transfers Overall transfer level: Needs assistance Equipment used: Rolling walker (2 wheeled) Transfers: Sit to/from Stand Sit to Stand: Min guard            Ambulation/Gait Ambulation/Gait assistance: Min guard Gait Distance (Feet): 60 Feet Assistive device: Rolling walker (2 wheeled) Gait Pattern/deviations: Step-to pattern;Antalgic Gait  velocity: decreased   General Gait Details: Pt was able to ambulate 60 ft with slow antalgic step to pattern.   Stairs Stairs: Yes Stairs assistance: Min guard Stair Management: Two rails;Forwards;Backwards (fwd up/ backwards down) Number of Stairs: 4 General stair comments: Pt was able to ascend/descend stairs with CGA for safety       Balance Overall balance assessment: Needs assistance Sitting-balance support: No upper extremity supported;Feet supported Sitting balance-Leahy Scale: Good Sitting balance - Comments: no LOB in sitting   Standing balance support: Bilateral upper extremity supported Standing balance-Leahy Scale: Fair       Cognition Arousal/Alertness: Awake/alert Behavior During Therapy: WFL for tasks assessed/performed Overall Cognitive Status: Within Functional Limits for tasks assessed          General Comments: Pt is A and O x 4             Pertinent Vitals/Pain Pain Assessment: 0-10 Pain Score: 9  Faces Pain Scale: Hurts whole lot Pain Location: severe pain in back with wt bearing Pain Descriptors / Indicators: Discomfort;Grimacing Pain Intervention(s): Limited activity within patient's tolerance;Monitored during session;Premedicated before session;Repositioned           PT Goals (current goals can now be found in the care plan section) Acute Rehab PT Goals Patient Stated Goal: To return home  Progress towards PT goals: Progressing toward goals    Frequency    Min 2X/week      PT Plan Current plan remains appropriate       AM-PAC PT "6 Clicks" Mobility   Outcome Measure  Help needed turning from your back  to your side while in a flat bed without using bedrails?: A Little Help needed moving from lying on your back to sitting on the side of a flat bed without using bedrails?: A Lot Help needed moving to and from a bed to a chair (including a wheelchair)?: A Little Help needed standing up from a chair using your arms (e.g.,  wheelchair or bedside chair)?: A Little Help needed to walk in hospital room?: A Lot Help needed climbing 3-5 steps with a railing? : A Lot 6 Click Score: 15    End of Session Equipment Utilized During Treatment: Gait belt Activity Tolerance: Patient limited by pain Patient left: with chair alarm set;with call bell/phone within reach Nurse Communication: Mobility status PT Visit Diagnosis: Muscle weakness (generalized) (M62.81);Difficulty in walking, not elsewhere classified (R26.2);Pain;Unsteadiness on feet (R26.81) Pain - part of body:  (back)     Time: 5400-8676 PT Time Calculation (min) (ACUTE ONLY): 25 min  Charges:  $Gait Training: 23-37 mins                     Julaine Fusi PTA 08/05/20, 12:26 PM

## 2020-08-05 NOTE — Care Management Obs Status (Signed)
[facility] NOTIFICATION   Patient Details  Name: BRIANNY SOULLIERE MRN: [id number] Date of Birth: 1941-02-03   Medicare Observation Status Notification Given:  Yes    Katiejo Gilroy E Traycen Goyer, LCSW 08/05/2020, 9:42 AM

## 2020-08-08 ENCOUNTER — Ambulatory Visit (INDEPENDENT_AMBULATORY_CARE_PROVIDER_SITE_OTHER): Payer: Medicare Other | Admitting: Nurse Practitioner

## 2020-08-08 ENCOUNTER — Other Ambulatory Visit: Payer: Self-pay

## 2020-08-08 ENCOUNTER — Encounter: Payer: Self-pay | Admitting: Nurse Practitioner

## 2020-08-08 ENCOUNTER — Ambulatory Visit (INDEPENDENT_AMBULATORY_CARE_PROVIDER_SITE_OTHER): Payer: Medicare Other

## 2020-08-08 VITALS — BP 138/70 | HR 88 | Temp 97.2°F | Resp 16 | Ht 66.0 in | Wt 214.6 lb

## 2020-08-08 DIAGNOSIS — Z23 Encounter for immunization: Secondary | ICD-10-CM

## 2020-08-08 DIAGNOSIS — M5442 Lumbago with sciatica, left side: Secondary | ICD-10-CM | POA: Diagnosis not present

## 2020-08-08 DIAGNOSIS — G44011 Episodic cluster headache, intractable: Secondary | ICD-10-CM

## 2020-08-08 DIAGNOSIS — I10 Essential (primary) hypertension: Secondary | ICD-10-CM

## 2020-08-08 DIAGNOSIS — I6310 Cerebral infarction due to embolism of unspecified precerebral artery: Secondary | ICD-10-CM

## 2020-08-08 DIAGNOSIS — I639 Cerebral infarction, unspecified: Secondary | ICD-10-CM | POA: Diagnosis not present

## 2020-08-08 MED ORDER — PNEUMOCOCCAL 13-VAL CONJ VACC IM SUSP: 0.5000 mL | Freq: Once | INTRAMUSCULAR | 0 refills | Status: AC

## 2020-08-08 NOTE — Progress Notes (Signed)
[facility] [place], [place] [address]  Internal MEDICINE  Office Visit Note  Patient Name: [name] Douglas  [id number]  [id number]  Date of Service: 09/06/2020  Chief Complaint  Patient presents with  . Follow-up    Repeat vitamin D, had been in hospital and had mini stroke  . COPD  . Asthma  . Anxiety  . Depression  . Gastroesophageal Reflux  . Hypertension  . Hyperlipidemia    The patient is here for routine follow up. Has been hospitalized twice since her last visit. She did have TIA on Thanksgiving day. She was to have laminectomy surgery to repair damaged disc in her lumbar spine to relieve pain. She state that surgery was postponed due to the recent mini-stroke. Have to weight four to six weeks after the mini stroke before they can consider doing surgery again. She was hospitalized again 08/04/2020 due to the intractable back pain which is most severe on her right side. She is following up with neurologist. She has physical and occupational therapy coming to her home to help her build strength and help with pain control.  -reviewed carotid doppler done 07/15/2020 showing moderate plaque with <50% stenosis, bilaterally.  -MRI done 07/15/2020 showed acute/subacute left pontine hemorrhagic stroke with mild cerebral edema. She has chronic microvascular ischemic disease.  -review of labs was good. -blood pressure doing well today.  -motor functions and gait have returned to baseline since prior to her stroke.        Current Medication: Outpatient Encounter Medications as of 08/08/2020  Medication Sig  . albuterol (VENTOLIN HFA) 108 (90 Base) MCG/ACT inhaler Inhale 2 puffs into the lungs every 6 (six) hours as needed for wheezing or shortness of breath.  Marland Kitchen aspirin EC 81 MG tablet Take 81 mg by mouth daily. Swallow whole.  . Aspirin-Acetaminophen-Caffeine (GOODY HEADACHE PO) Take 1 packet by mouth daily as needed (headaches/pain).  Marland Kitchen clopidogrel  (PLAVIX) 75 MG tablet Take 1 tablet (75 mg total) by mouth daily.  . cyclobenzaprine (FLEXERIL) 5 MG tablet Take 1 tablet (5 mg total) by mouth at bedtime.  . docusate sodium (COLACE) 100 MG capsule Take 100 mg by mouth daily as needed for mild constipation.   Marland Kitchen donepezil (ARICEPT) 5 MG tablet Take 1 tablet (5 mg total) by mouth at bedtime.  . ergocalciferol (DRISDOL) 1.25 MG (50000 UT) capsule Take 1 capsule (50,000 Units total) by mouth every Friday.  . gabapentin (NEURONTIN) 100 MG capsule Take 100 mg by mouth 3 (three) times daily.  Marland Kitchen HYDROcodone-acetaminophen (NORCO/VICODIN) 5-325 MG tablet Take 1-2 tablets by mouth 2 (two) times daily as needed for severe pain.  Marland Kitchen losartan (COZAAR) 25 MG tablet Take 1 tablet (25 mg total) by mouth daily.  . metoprolol succinate (TOPROL-XL) 50 MG 24 hr tablet Take 2 tablets (100 mg total) by mouth daily.  Marland Kitchen venlafaxine XR (EFFEXOR-XR) 75 MG 24 hr capsule TAKE 2 CAPSULES BY MOUTH DAILY. (Patient taking differently: Take 150 mg by mouth daily with breakfast.)  . [DISCONTINUED] atorvastatin (LIPITOR) 80 MG tablet Take 1 tablet (80 mg total) by mouth daily at 6 PM.  . [DISCONTINUED] levothyroxine (SYNTHROID) 75 MCG tablet Take 75 mcg by mouth daily before breakfast.  . [EXPIRED] pneumococcal 13-valent conjugate vaccine (PREVNAR 13) SUSP injection Inject 0.5 mLs into the muscle once for 1 dose.   No facility-administered encounter medications on file as of 08/08/2020.    Surgical History: Past Surgical History:  Procedure Laterality Date  .  CARDIAC CATHETERIZATION  ~ 2007  . EXCISIONAL HEMORRHOIDECTOMY  1960's  . EYE SURGERY Bilateral    Cataract extraction with IOL  . KNEE ARTHROSCOPY Left X 2 <2008  . KNEE ARTHROSCOPY WITH SUBCHONDROPLASTY Right 04/19/2016   Procedure: KNEE ARTHROSCOPY WITH SUBCHONDROPLASTY, PARTIAL MENISCECTOMY;  Surgeon: Hessie Knows, MD;  Location: [facility] ORS;  Service: Orthopedics;  Laterality: Right;  . LOOP RECORDER INSERTION N/A  01/02/2018   Procedure: LOOP RECORDER INSERTION;  Surgeon: Deboraha Sprang, MD;  Location: [facility];  Service: Cardiovascular;  Laterality: N/A;  . SHOULDER OPEN ROTATOR CUFF REPAIR Right 2001  . TONSILLECTOMY  ~ 1950  . TOTAL KNEE ARTHROPLASTY Left 2008  . TOTAL KNEE ARTHROPLASTY Right 07/03/2016   Procedure: TOTAL KNEE ARTHROPLASTY;  Surgeon: Hessie Knows, MD;  Location: [facility] ORS;  Service: Orthopedics;  Laterality: Right;  . TUBAL LIGATION  1973  . VAGINAL HYSTERECTOMY  1974    Medical History: Past Medical History:  Diagnosis Date  . Anxiety   . Arthritis    "shoulders" (08/20/2014)  . Asthma   . Chronic lower back pain   . Complication of anesthesia    "agitated & restless after knee replacement in 2008"  . COPD (chronic obstructive pulmonary disease) ([place])   . DDD (degenerative disc disease), lumbar   . Depression   . Diverticulosis   . GERD (gastroesophageal reflux disease)   . High cholesterol   . Hypertension   . Hypothyroidism   . Memory loss    mild  . On home oxygen therapy    "2L at night" (08/20/2014)  . Peripheral vascular disease ([place])   . Pre-diabetes   . Shortness of breath dyspnea    with exertion  . Stroke ([place])    mild, no deficits    Family History: Family History  Problem Relation Age of Onset  . Diabetes Mother   . Hypertension Mother   . Lung cancer Mother   . Congestive Heart Failure Father   . Congestive Heart Failure Sister     Social History   Socioeconomic History  . Marital status: Divorced    Spouse name: Not on file  . Number of children: Not on file  . Years of education: Not on file  . Highest education level: Not on file  Occupational History  . Not on file  Tobacco Use  . Smoking status: Current Some Day Smoker    Packs/day: 0.25    Years: 53.00    Pack years: 13.25    Types: Cigarettes  . Smokeless tobacco: Never Used  . Tobacco comment: 3 cigarettes in week  Vaping Use  . Vaping Use: Never used   Substance and Sexual Activity  . Alcohol use: No  . Drug use: No  . Sexual activity: Never  Other Topics Concern  . Not on file  Social History Narrative  . Not on file   Social Determinants of Health   Financial Resource Strain: Not on file  Food Insecurity: Not on file  Transportation Needs: Not on file  Physical Activity: Not on file  Stress: Not on file  Social Connections: Not on file  Intimate Partner Violence: Not on file      Review of Systems  Constitutional: Positive for activity change and fatigue. Negative for chills and unexpected weight change.  HENT: Negative for congestion, postnasal drip, rhinorrhea, sneezing and sore throat.   Respiratory: Negative for cough, chest tightness, shortness of breath and wheezing.   Cardiovascular: Negative for chest  pain and palpitations.  Gastrointestinal: Negative for abdominal pain, constipation, diarrhea, nausea and vomiting.  Endocrine: Negative for cold intolerance, heat intolerance, polydipsia and polyuria.  Musculoskeletal: Positive for arthralgias, back pain and myalgias. Negative for joint swelling and neck pain.       Moderate to severe in nature. Seeing spinal surgeon and pain management. Due to have laminectomy lumbar disc to help improve pain.   Skin: Negative for rash.  Neurological: Positive for weakness. Negative for tremors and numbness.  Hematological: Negative for adenopathy. Does not bruise/bleed easily.  Psychiatric/Behavioral: Negative for behavioral problems (Depression), sleep disturbance and suicidal ideas. The patient is not nervous/anxious.    Today's Vitals   08/08/20 1444  BP: 138/70  Pulse: 88  Resp: 16  Temp: (!) 97.2 F (36.2 C)  SpO2: 97%  Weight: 214 lb 9.6 oz (97.3 kg)  Height: 5\' 6"  (1.676 m)   Body mass index is 34.64 kg/m.   Physical Exam Vitals and nursing note reviewed.  Constitutional:      General: She is not in acute distress.    Appearance: Normal appearance. She is  well-developed. She is obese. She is not diaphoretic.  HENT:     Head: Normocephalic and atraumatic.     Nose: Nose normal.     Mouth/Throat:     Pharynx: No oropharyngeal exudate.  Eyes:     Pupils: Pupils are equal, round, and reactive to light.  Neck:     Thyroid: No thyromegaly.     Vascular: No carotid bruit or JVD.     Trachea: No tracheal deviation.  Cardiovascular:     Rate and Rhythm: Normal rate and regular rhythm.     Heart sounds: Normal heart sounds. No murmur heard. No friction rub. No gallop.   Pulmonary:     Effort: Pulmonary effort is normal. No respiratory distress.     Breath sounds: Normal breath sounds. No wheezing or rales.  Chest:     Chest wall: No tenderness.  Abdominal:     General: Bowel sounds are normal.     Palpations: Abdomen is soft.     Tenderness: There is no abdominal tenderness.  Musculoskeletal:        General: Normal range of motion.     Cervical back: Normal range of motion and neck supple.  Lymphadenopathy:     Cervical: No cervical adenopathy.  Skin:    General: Skin is warm and dry.     Capillary Refill: Capillary refill takes less than 2 seconds.  Neurological:     Mental Status: She is alert and oriented to person, place, and time. Mental status is at baseline.     Cranial Nerves: No cranial nerve deficit.  Psychiatric:        Mood and Affect: Mood normal.        Behavior: Behavior normal.        Thought Content: Thought content normal.        Judgment: Judgment normal.    Assessment/Plan: 1. Essential hypertension Blood pressure stable. Continue bp medication as prescribed  2. Intractable episodic cluster headache Some improved. She is now seeing neurology.   3. Cerebrovascular accident (CVA), unspecified mechanism [facility]) Patient seeing neurology for continued management  4. Low back pain with left-sided sciatica, unspecified back pain laterality, unspecified chronicity Some improved. May continue hydrocodone/APAP as  needed and as previously prescribed. She should also continue gabapentin three times daily.   5. Need for vaccination against Streptococcus pneumoniae using pneumococcal conjugate vaccine  7 Prescription for Prevnar 13 sent to her pharmacy for administration.  - pneumococcal 13-valent conjugate vaccine (PREVNAR 13) SUSP injection; Inject 0.5 mLs into the muscle once for 1 dose.  Dispense: 0.5 mL; Refill: 0  General Counseling: Clodagh verbalizes understanding of the findings of todays visit and agrees with plan of treatment. I have discussed any further diagnostic evaluation that may be needed or ordered today. We also reviewed her medications today. she has been encouraged to call the office with any questions or concerns that should arise related to todays visit.  This patient was seen by [place] with Dr Lavera Guise as a part of collaborative care agreement  Meds ordered this encounter  Medications  . pneumococcal 13-valent conjugate vaccine (PREVNAR 13) SUSP injection    Sig: Inject 0.5 mLs into the muscle once for 1 dose.    Dispense:  0.5 mL    Refill:  0    Order Specific Question:   Supervising Provider    Answer:   Lavera Guise [[id number]]    Total time spent: 30 Minutes   Time spent includes review of chart, medications, test results, and follow up plan with the patient.      Dr Lavera Guise Internal medicine

## 2020-08-09 ENCOUNTER — Telehealth: Payer: Self-pay

## 2020-08-09 LAB — CUP PACEART REMOTE DEVICE CHECK
Date Time Interrogation Session: 20211219225120
Implantable Pulse Generator Implant Date: 20190516

## 2020-08-10 DIAGNOSIS — M545 Low back pain, unspecified: Secondary | ICD-10-CM | POA: Diagnosis not present

## 2020-08-10 DIAGNOSIS — G8929 Other chronic pain: Secondary | ICD-10-CM | POA: Diagnosis not present

## 2020-08-10 DIAGNOSIS — J449 Chronic obstructive pulmonary disease, unspecified: Secondary | ICD-10-CM | POA: Diagnosis not present

## 2020-08-10 DIAGNOSIS — R42 Dizziness and giddiness: Secondary | ICD-10-CM | POA: Diagnosis not present

## 2020-08-10 DIAGNOSIS — I69398 Other sequelae of cerebral infarction: Secondary | ICD-10-CM | POA: Diagnosis not present

## 2020-08-10 DIAGNOSIS — I10 Essential (primary) hypertension: Secondary | ICD-10-CM | POA: Diagnosis not present

## 2020-08-10 NOTE — Telephone Encounter (Signed)
Can you find out from patient if she plans to continue with the physical therapy. Thanks.

## 2020-08-10 NOTE — Telephone Encounter (Signed)
Message sent to provider 

## 2020-08-11 ENCOUNTER — Telehealth: Payer: Self-pay | Admitting: Cardiovascular Disease

## 2020-08-11 NOTE — Telephone Encounter (Signed)
Please arrange for the patient to be seen by Dr. Caryl Comes.  The neurologist mentions that he sent a message to Dr. Caryl Comes regarding the need to switch to anticoagulation.

## 2020-08-11 NOTE — Telephone Encounter (Signed)
I tried to call [facility]--lunch time   I have not had any contact with [facility] regarding anticoagulation nor do I have record of atrial fibrillation I will try and contact [facility] neuro after lunch but at this point have nothing to offer

## 2020-08-11 NOTE — Telephone Encounter (Signed)
This is what Dr. Melrose Nakayama mentioned in his last office note on 12/15:  " Will send a message to Dr. Virl Axe about starting an anti-coagulation medication due to afib"   However,I did not see evidence of documented A-fib. It seems that the patient should address her issues with neurology and neurosurgery

## 2020-08-11 NOTE — Telephone Encounter (Signed)
Please advise on overbook or scheduling next available in march

## 2020-08-11 NOTE — Telephone Encounter (Signed)
LMOM to ask pt about PT plans

## 2020-08-11 NOTE — Telephone Encounter (Signed)
Patient's daughter calling on behalf of patient. She states that Dr. Lacinda Axon won't do surgery until she is cleared to come off plavix - per the telephone notes it looks like we are wanting the neurologist to clear the patient. Larene Beach, patients daughter, states that Dr. Melrose Nakayama wants the patient on a different anticoagulation and wants her to follow up with cardio. There are phone notes regarding this situation open already, this one didn't really fit to add to them. Please call/advise as to whether they need to get in to see cardio, nuero, or both!   Thank you   Please call shannon's work number below   Work number: [phone] Ask for [employer]

## 2020-08-11 NOTE — Telephone Encounter (Signed)
Attempted to call the daughter back to discuss the situation and get more information. Was unable to leave a message due to the voicemail being full.   The patient used to be a [employer] patient and has not been seen since 2019. Clopidogrel was filled by another provider and according to the preop clearance note, clearance for that medication and aspirin need to be made by neurology.  The patient will need to be seen if they need clearance from cardiology.

## 2020-08-11 NOTE — Telephone Encounter (Signed)
To Dr. Klein to review. 

## 2020-08-15 NOTE — Telephone Encounter (Signed)
Spoke to pt and she did state that she is continuing PT she was just having a bad day and some family issues but she definitely is going to continue

## 2020-08-16 DIAGNOSIS — M5136 Other intervertebral disc degeneration, lumbar region: Secondary | ICD-10-CM | POA: Diagnosis not present

## 2020-08-16 NOTE — Telephone Encounter (Signed)
Spoke with Toni Amend at [facility] and she states there was a telephone encounter entered by our office back on 11/04/19 showing Linq readings. There was a note entered by Judy Pimple dated November 04, 2019 with Linq strips and then documentation from Dr. Graciela Husbands to set up appointment to discuss anticoagulation. Will forward to both providers for review and recommendations.

## 2020-08-16 NOTE — Telephone Encounter (Signed)
I have to confess they are right and I am wrong  The interrogation of 3/21 showed afib and a message was sent to set up an appt to discuss anticoagulation and apparently did not happen.  This should happen but in terms of her surgery, she can come off her plavix for surgery and then anticoagulation could be intitated. Thanks SK

## 2020-08-17 ENCOUNTER — Other Ambulatory Visit: Payer: Self-pay

## 2020-08-17 MED ORDER — LEVOTHYROXINE SODIUM 75 MCG PO TABS: 75.0000 ug | ORAL_TABLET | Freq: Every day | ORAL | 0 refills | Status: DC

## 2020-08-17 NOTE — Telephone Encounter (Signed)
Spoke with patients daughter per release form and she reviewed that patient is not able to have her surgery at all. She states that they were told by Dr. Adriana Simas that she could not have any surgery due to her type of stroke and that the only surgery she can have at this point is for life threatening. Advised that I have appointment with Dr. Graciela Husbands for September 06, 2020 at 08:00 AM here in our office. Recommended to arrive early so that she can get through screening and get to our office. She verbalized understanding with no further questions at this time. Will send to Dr. Odessa Fleming nurse so she is up to date as well.

## 2020-08-17 NOTE — Telephone Encounter (Signed)
Spoke with Dr. Odessa Fleming nurse Herbert Seta and she recommended that we try to get her in on 09/06/20 or 09/13/20 if she has had her procedure by that point and then we can review with her. Will reach out to patient and discuss this information with her.

## 2020-08-17 NOTE — Telephone Encounter (Signed)
Sent message to Dr. Graciela Husbands to see if I can add patient on 09/01/20 at 8:20 AM.

## 2020-08-17 NOTE — Telephone Encounter (Signed)
No answer/Voicemail box is full on Shannon's number Left voicemail message on Donna's cell number

## 2020-08-17 NOTE — Telephone Encounter (Signed)
Spoke with daughter per release form and that her mother spoke with someone in [place] and she has appointment the day before. Advised that I would talk with her first and keep appointment with Dr. Graciela Husbands if at all possible. She is going to talk with her further and will be in touch.

## 2020-08-17 NOTE — Telephone Encounter (Signed)
No answer/No voicemail on cell and left voicemail message on home number

## 2020-08-22 ENCOUNTER — Other Ambulatory Visit: Payer: Self-pay

## 2020-08-22 MED ORDER — ATORVASTATIN CALCIUM 80 MG PO TABS: 80.0000 mg | ORAL_TABLET | Freq: Every day | ORAL | 1 refills | Status: DC

## 2020-08-22 NOTE — Telephone Encounter (Signed)
Called patient - no answer, VM not set up

## 2020-08-22 NOTE — Telephone Encounter (Signed)
The patient has an appointment on 09/05/20 with Otilio Saber, PA in [place], then on 09/06/20 with Dr. Graciela Husbands in [place].  She does not need both appointments.   Will ask scheduling to please reach out to the patient and see which appointment she would like to keep as the discussion of anticoagulation and reprogramming of her device could be taken care of with either provider.  I have copied appointment notes regarding re-programming onto her 09/06/20 appt.

## 2020-08-22 NOTE — Telephone Encounter (Signed)
I am good with either [id number]

## 2020-08-22 NOTE — Progress Notes (Signed)
Carelink Summary Report / Loop Recorder 

## 2020-08-24 DIAGNOSIS — M545 Low back pain, unspecified: Secondary | ICD-10-CM | POA: Diagnosis not present

## 2020-08-24 DIAGNOSIS — I69398 Other sequelae of cerebral infarction: Secondary | ICD-10-CM | POA: Diagnosis not present

## 2020-08-24 DIAGNOSIS — I10 Essential (primary) hypertension: Secondary | ICD-10-CM | POA: Diagnosis not present

## 2020-08-24 DIAGNOSIS — J449 Chronic obstructive pulmonary disease, unspecified: Secondary | ICD-10-CM | POA: Diagnosis not present

## 2020-08-24 DIAGNOSIS — G8929 Other chronic pain: Secondary | ICD-10-CM | POA: Diagnosis not present

## 2020-08-24 DIAGNOSIS — R42 Dizziness and giddiness: Secondary | ICD-10-CM | POA: Diagnosis not present

## 2020-08-24 NOTE — Telephone Encounter (Signed)
I reviewed the patient's appointments again today. It looks like she did call back and cancel her appointment with Mardelle Matte, [place] on 09/05/20 in [place] and will see Dr. Graciela Husbands in [place] on 09/06/20.

## 2020-08-25 DIAGNOSIS — E785 Hyperlipidemia, unspecified: Secondary | ICD-10-CM | POA: Diagnosis not present

## 2020-08-25 DIAGNOSIS — Z9981 Dependence on supplemental oxygen: Secondary | ICD-10-CM | POA: Diagnosis not present

## 2020-08-25 DIAGNOSIS — M545 Low back pain, unspecified: Secondary | ICD-10-CM | POA: Diagnosis not present

## 2020-08-25 DIAGNOSIS — R42 Dizziness and giddiness: Secondary | ICD-10-CM | POA: Diagnosis not present

## 2020-08-25 DIAGNOSIS — E039 Hypothyroidism, unspecified: Secondary | ICD-10-CM | POA: Diagnosis not present

## 2020-08-25 DIAGNOSIS — F17210 Nicotine dependence, cigarettes, uncomplicated: Secondary | ICD-10-CM | POA: Diagnosis not present

## 2020-08-25 DIAGNOSIS — I10 Essential (primary) hypertension: Secondary | ICD-10-CM | POA: Diagnosis not present

## 2020-08-25 DIAGNOSIS — F419 Anxiety disorder, unspecified: Secondary | ICD-10-CM | POA: Diagnosis not present

## 2020-08-25 DIAGNOSIS — I69398 Other sequelae of cerebral infarction: Secondary | ICD-10-CM | POA: Diagnosis not present

## 2020-08-25 DIAGNOSIS — G8929 Other chronic pain: Secondary | ICD-10-CM | POA: Diagnosis not present

## 2020-08-25 DIAGNOSIS — J449 Chronic obstructive pulmonary disease, unspecified: Secondary | ICD-10-CM | POA: Diagnosis not present

## 2020-08-25 DIAGNOSIS — Z96653 Presence of artificial knee joint, bilateral: Secondary | ICD-10-CM | POA: Diagnosis not present

## 2020-08-30 DIAGNOSIS — M545 Low back pain, unspecified: Secondary | ICD-10-CM | POA: Diagnosis not present

## 2020-08-30 DIAGNOSIS — G8929 Other chronic pain: Secondary | ICD-10-CM | POA: Diagnosis not present

## 2020-08-30 DIAGNOSIS — J449 Chronic obstructive pulmonary disease, unspecified: Secondary | ICD-10-CM | POA: Diagnosis not present

## 2020-08-30 DIAGNOSIS — I10 Essential (primary) hypertension: Secondary | ICD-10-CM | POA: Diagnosis not present

## 2020-08-30 DIAGNOSIS — R42 Dizziness and giddiness: Secondary | ICD-10-CM | POA: Diagnosis not present

## 2020-08-30 DIAGNOSIS — I69398 Other sequelae of cerebral infarction: Secondary | ICD-10-CM | POA: Diagnosis not present

## 2020-09-05 ENCOUNTER — Encounter: Payer: Medicare Other | Admitting: Student

## 2020-09-06 ENCOUNTER — Ambulatory Visit: Payer: Medicare Other | Admitting: Internal Medicine

## 2020-09-06 NOTE — Progress Notes (Signed)
Patient: [name] Douglas  Service Category: E/M  Provider: Gaspar Cola, MD  DOB: Mar 08, 1941  DOS: 09/07/2020  Referring Provider: Cathleen Fears, MD  MRN: [id number]  Setting: Ambulatory outpatient  PCP: Lavera Guise, MD  Type: New Patient  Specialty: Interventional Pain Management    Location: Office  Delivery: Face-to-face     Primary Reason(s) for Visit: Encounter for initial evaluation of one or more chronic problems (new to examiner) potentially causing chronic pain, and posing a threat to normal musculoskeletal function. (Level of risk: High) CC: Back Pain (Lower back)  HPI  [name] Douglas is a 80 y.o. year old, female patient, who comes for the first time to our practice referred by Cathleen Fears, MD for our initial evaluation of her chronic pain. She has Diarrhea; Essential hypertension; Hyperlipidemia; Hypothyroidism; Tobacco abuse; COPD (chronic obstructive pulmonary disease) ([place]); Enteritis due to Clostridium difficile; Colitis due to Clostridium difficile; Recurrent Clostridium difficile diarrhea; Ileus ([place]); Primary localized osteoarthritis of right knee; L brain TIA (transient ischemic attack); Solitary pulmonary nodule; Occlusion and stenosis of bilateral carotid arteries; Encounter for general adult medical examination with abnormal findings; Nausea and vomiting; Dysuria; Need for vaccination against Streptococcus pneumoniae using pneumococcal conjugate vaccine 7; Iron deficiency anemia; Vitamin D deficiency; Flu vaccine need; Intractable episodic cluster headache; Other symptoms and signs involving the nervous system; History of CVA (cerebrovascular accident); Acute diarrhea; Subarachnoid hemorrhage ([place]); Atopic dermatitis; Dizziness; Muscle spasm; Urinary tract infection with hematuria; Headache disorder; CVA (cerebral vascular accident) ([place]); Blurred vision; Intractable low back pain; Fall down stairs; Hemorrhagic stroke ([place]); Lumbar central spinal stenosis (Moderate: L2-3  Severe:  L3-4, L4-5) w/ neurogenic claudication; History of stroke; Chronic pain syndrome; Pharmacologic therapy; Disorder of skeletal system; Problems influencing health status; Uncomplicated opioid dependence ([place]); Chronic low back pain (2ry area of Pain) (Bilateral) (R>L) w/ sciatica (Right); Chronic lower extremity pain (1ry area of Pain) (Right); Chronic lumbar radiculopathy (Right); Lumbosacral radiculopathy at L2 (Right); Lumbar radiculitis (L2 dermatome) (Right); Unspecified inflammatory spondylopathy, lumbar region [facility]); Chronic anticoagulation (Plavix); Abnormal MRI, lumbar spine (04/20/2020); Lumbar foraminal stenosis (Bilateral: L2-3, L3-4, L4-5) (Right: L5-S1) (Severe left L4-5); Grade 1 Lumbar Anterolisthesis (L3/4 and L4/5); Grade 1 Lumbar Retrolisthesis (L2/3); Protrusion of lumbar intervertebral disc (Multilevel); Compression fracture of L1 lumbar vertebra, sequela; Other intervertebral disc degeneration, lumbar region; Lumbosacral facet syndrome (Multilevel) (Bilateral); Lumbar facet hypertrophy (Multilevel) (Bilateral); Ligamentum flavum hypertrophy; Lumbar lateral recess stenosis (Right: L2-3); DDD (degenerative disc disease), lumbosacral; Compression fracture of L5 vertebra (HCC); and Osteoporosis with pathological fracture of lumbar vertebra (HCC) on their problem list. Today she comes in for evaluation of her Back Pain (Lower back)  Pain Assessment: Location: Lower Back Radiating: pain radiaties down to her groin area and continue down front of her thigh down her legs to her knee Onset: More than a month ago Duration: Chronic pain Quality: Aching,Burning,Sharp,Stabbing,Throbbing,Constant Severity: 0-No pain/10 (subjective, self-reported pain score)  Effect on ADL: limits my daily activities Timing: Constant Modifying factors: prenisone pack, bio freeze BP: (!) 149/62  HR: 92  Onset and Duration: Gradual Cause of pain: Unknown Severity: Getting worse, NAS-11 at its worse: 10/10,  NAS-11 at its best: 4/10, NAS-11 now: 0/10 and NAS-11 on the average: 7/10 Timing: Morning, During activity or exercise and After activity or exercise Aggravating Factors: Walking Alleviating Factors: Resting and Sitting Associated Problems: Numbness and Tingling Quality of Pain: Aching, Burning, Constant, Disabling, Exhausting, Horrible, Sharp and Sickening Previous Examinations or Tests: Nerve block Previous Treatments: Epidural steroid injections and Steroid  treatments by mouth  According to the patient she was sent over to [place] by Dr. Deetta Perla for management of her low back and lower extremity pain.  She was actually scheduled for surgery and just prior to the surgery around Thanksgiving, she had a stroke and the surgery was canceled.  She comes into the clinic today on a wheelchair.  According to the patient the primary area of pain is that of the right lower extremity where the pain goes over the anterior thigh to the level of the knee (L2 dermatome).  She describes this pain as a severe burning and stabbing sensation.  This pain appears to follow an L2 dermatomal pattern.  She also indicates that she does not have this pain all the time and in fact she recently finished a second round of prednisone provided to her by Dr. Lacinda Axon, which completely eliminated all of her pain.  She describes that this pain typically will come about whenever she stands for prolonged.  Time.  She refers that it has happened twice while she was shopping at [place].  When this pain comes on, she has to sit down and within a couple of minutes it goes away.  However once it has occurred, she has to go home because it will immediately come back after she stands for any prolonged period of time.  The patient's secondary area pain is that of the lower back (Bilateral) (R>L).  She denies any prior back surgeries but does admit to having had x-rays, MRIs, and physical therapy which actually worsened the pain.  The patient  indicates having had II nerve blocks done at the neuro pain clinic by Dr. Alba Destine, both of which have failed to provide her with any long-term benefit.  According to the patient's daughter, who has been keeping a diary of the patient's problems, the first injection provided the patient with approximately 7 days of pain relief and the second injection only provided her with 24 to 48 hours of pain relief after that the pain has come back.  Once the second injection failed to benefit, she was referred by Dr. Alba Destine to Dr. Lacinda Axon for surgery.  Pharmacotherapy: The patient is currently taking hydrocodone/APAP 5/325 1 tablet p.o. twice daily; gabapentin (Neurontin) 100 mg capsule, 1 cap p.o. 3 times daily; cyclobenzaprine (Flexeril) 5 mg tablet, 1 tab p.o. daily at bedtime.  Of significance is the fact that the patient is currently on Plavix 75 mg p.o. daily.  Lumbar MRI: Done on 04/20/2020.  Ordered by Lonell Face, NP ([place]) (04/20/2020) LUMBAR MRI FINDINGS: Alignment: Slight anterolisthesis is present at L3-4 and L4-5. Minimal retrolisthesis present at L2-3. Vertebrae: A remote superior endplate fractures present at L1. Chronic fatty endplate marrow changes are present at L4-5 bilaterally.  Disc levels: T12-L1: A mild broad-based disc protrusion is present. Facet hypertrophy is noted bilaterally. L1-2: A mild broad-based disc protrusion is present. Facet hypertrophy is worse on the right. L2-3: Extruded disc material extends 11 mm superiorly in the right lateral recess. Broad-based disc protrusion is present. Moderate facet hypertrophy and ligamentum flavum thickening is noted. This results in moderate central canal stenosis with crowding of the nerve roots. Moderate foraminal narrowing is worse on the right. L3-4: A broad-based disc protrusion is present. Moderate facet hypertrophy and ligamentum flavum thickening contributes to severe central canal stenosis with crowding of  nerve roots above this level. Moderate foraminal narrowing is present bilaterally. L4-5: A broad-based disc protrusion is present. Moderate facet  hypertrophy is worse on left. This results in severe central canal stenosis. Severe left and moderate right foraminal narrowing is present. L5-S1: A mild broad-based disc bulge is present. Moderate facet hypertrophy present on the right. This leads to mild right foraminal stenosis.  IMPRESSION: 1. Moderate central and bilateral foraminal stenosis at L2-3 is worse on the right. A broad-based disc protrusion present with the superior disc extrusion extending 11 mm above the inferior endplate of L2 in the right lateral recess.  2. Severe central canal stenosis at L3-4 and L4-5 with crowding of the nerve roots above both levels. 3. Moderate foraminal narrowing bilaterally at L3-4. 4. Severe left and moderate right foraminal stenosis at L4-5. 5. Mild right foraminal narrowing at L5-S1. 6. Remote superior endplate compression fracture at L1  Interventional therapies by Girtha Hake, MD [facility] PMR): 1. Right L5-S1 TFESI #1 (05/05/2020) - 7 days of pain relief. 2. Bilateral L5-S1 TFESI #2 (06/09/2020) - 24 to 48 hours of pain relief.  Assessment of interventional therapies: I am not quite sure why Dr. Alba Destine chose to do the above interventional therapies at those particular levels.  The patient describes symptoms primarily affecting the right L2 nerve root.  A lumbar MRI done on 04/20/2020 and available prior to the procedures, indicating that the patient has central and bilateral foraminal stenosis at L2-3, worse on the right side.  Above it, there is an L1 vertebral body fracture and a disc protrusion as well as facet hypertrophy which is also worse on the right side, likely to be causing some L1-2 lateral recess narrowing, which in combination with the right sided L2-3 disc extrusion with cephalad migration, may also be contributing to the right L2  radicular symptoms.                        Today I took the time to provide the patient with information regarding my pain practice. The patient was informed that my practice is divided into two sections: an interventional pain management section, as well as a completely separate and distinct medication management section. I explained that I have procedure days for my interventional therapies, and evaluation days for follow-ups and medication management. Because of the amount of documentation required during both, they are kept separated. This means that there is the possibility that she may be scheduled for a procedure on one day, and medication management the next. I have also informed her that because of staffing and facility limitations, I no longer take patients for medication management only. To illustrate the reasons for this, I gave the patient the example of surgeons, and how inappropriate it would be to refer a patient to his/her care, just to write for the post-surgical antibiotics on a surgery done by a different surgeon.   Because interventional pain management is my board-certified specialty, the patient was informed that joining my practice means that they are open to any and all interventional therapies. I made it clear that this does not mean that they will be forced to have any procedures done. What this means is that I believe interventional therapies to be essential part of the diagnosis and proper management of chronic pain conditions. Therefore, patients not interested in these interventional alternatives will be better served under the care of a different practitioner.  The patient was also made aware of my Comprehensive Pain Management Safety Guidelines where by joining my practice, they limit all of their nerve blocks and joint injections to  those done by our practice, for as long as we are retained to manage their care.   Historic Controlled Substance Pharmacotherapy Review  PMP  and historical list of controlled substances: Hydrocodone/APAP 5/325 1 tablet p.o. twice daily (10 mg/day of hydrocodone) Current opioid analgesics: Hydrocodone/APAP 5/325 1 tablet p.o. twice daily (10 mg/day of hydrocodone)  MME/day: 10 mg/day  Historical Monitoring: The patient  reports no history of drug use. List of all UDS Test(s): Lab Results  Component Value Date   ETH <10 12/20/2017   List of other Serum/Urine Drug Screening Test(s):  Lab Results  Component Value Date   ETH <10 12/20/2017   Historical Background Evaluation: [place] PMP: PDMP reviewed during this encounter. Online review of the past 87-monthperiod conducted.             PMP NARX Score Report:  Narcotic: 220 Sedative: 110 Stimulant: 000 [place] Department of public safety, offender search: (Editor, commissioningInformation) Non-contributory Risk Assessment Profile: Aberrant behavior: None observed or detected today Risk factors for fatal opioid overdose: None identified today PMP NARX Overdose Risk Score: 200 Fatal overdose hazard ratio (HR): Calculation deferred Non-fatal overdose hazard ratio (HR): Calculation deferred Risk of opioid abuse or dependence: 0.7-3.0% with doses ? 36 MME/day and 6.1-26% with doses ? 120 MME/day. Substance use disorder (SUD) risk level: See below Personal History of Substance Abuse (SUD-Substance use disorder):  Alcohol: Negative  Illegal Drugs: Negative  Rx Drugs: Negative  ORT Risk Level calculation: Low Risk  Opioid Risk Tool - 09/07/20 1023      Family History of Substance Abuse   Alcohol Negative    Illegal Drugs Negative    Rx Drugs Negative      Personal History of Substance Abuse   Alcohol Negative    Illegal Drugs Negative    Rx Drugs Negative      Age   Age between 13-45years  No      History of Preadolescent Sexual Abuse   History of Preadolescent Sexual Abuse Negative or Female      Psychological Disease   Psychological Disease Negative    Depression Negative      Total  Score   Opioid Risk Tool Scoring 0    Opioid Risk Interpretation Low Risk          ORT Scoring interpretation table:  Score <3 = Low Risk for SUD  Score between 4-7 = Moderate Risk for SUD  Score >8 = High Risk for Opioid Abuse   PHQ-2 Depression Scale:  Total score:    PHQ-2 Scoring interpretation table: (Score and probability of major depressive disorder)  Score 0 = No depression  Score 1 = 15.4% Probability  Score 2 = 21.1% Probability  Score 3 = 38.4% Probability  Score 4 = 45.5% Probability  Score 5 = 56.4% Probability  Score 6 = 78.6% Probability   PHQ-9 Depression Scale:  Total score:    PHQ-9 Scoring interpretation table:  Score 0-4 = No depression  Score 5-9 = Mild depression  Score 10-14 = Moderate depression  Score 15-19 = Moderately severe depression  Score 20-27 = Severe depression (2.4 times higher risk of SUD and 2.89 times higher risk of overuse)   Pharmacologic Plan: As per protocol, I have not taken over any controlled substance management, pending the results of ordered tests and/or consults.            Initial impression: Pending review of available data and ordered tests.  Meds   Current  Outpatient Medications:  .  albuterol (VENTOLIN HFA) 108 (90 Base) MCG/ACT inhaler, Inhale 2 puffs into the lungs every 6 (six) hours as needed for wheezing or shortness of breath., Disp: 8 g, Rfl: 1 .  aspirin EC 81 MG tablet, Take 81 mg by mouth daily. Swallow whole., Disp: , Rfl:  .  Aspirin-Acetaminophen-Caffeine (GOODY HEADACHE PO), Take 1 packet by mouth daily as needed (headaches/pain)., Disp: , Rfl:  .  atorvastatin (LIPITOR) 80 MG tablet, Take 1 tablet (80 mg total) by mouth daily at 6 PM., Disp: 90 tablet, Rfl: 1 .  clopidogrel (PLAVIX) 75 MG tablet, Take 1 tablet (75 mg total) by mouth daily., Disp: 90 tablet, Rfl: 1 .  cyclobenzaprine (FLEXERIL) 5 MG tablet, Take 1 tablet (5 mg total) by mouth at bedtime., Disp: 30 tablet, Rfl: 1 .  docusate sodium (COLACE)  100 MG capsule, Take 100 mg by mouth daily as needed for mild constipation. , Disp: , Rfl:  .  donepezil (ARICEPT) 5 MG tablet, Take 1 tablet (5 mg total) by mouth at bedtime., Disp: 90 tablet, Rfl: 1 .  ergocalciferol (DRISDOL) 1.25 MG (50000 UT) capsule, Take 1 capsule (50,000 Units total) by mouth every Friday., Disp: 4 capsule, Rfl: 3 .  gabapentin (NEURONTIN) 100 MG capsule, Take 100 mg by mouth 3 (three) times daily., Disp: , Rfl:  .  HYDROcodone-acetaminophen (NORCO/VICODIN) 5-325 MG tablet, Take 1-2 tablets by mouth 2 (two) times daily as needed for severe pain., Disp: 15 tablet, Rfl: 0 .  levothyroxine (SYNTHROID) 75 MCG tablet, Take 1 tablet (75 mcg total) by mouth daily before breakfast., Disp: 90 tablet, Rfl: 0 .  losartan (COZAAR) 25 MG tablet, Take 1 tablet (25 mg total) by mouth daily., Disp: 30 tablet, Rfl: 2 .  metoprolol succinate (TOPROL-XL) 50 MG 24 hr tablet, Take 2 tablets (100 mg total) by mouth daily., Disp: 180 tablet, Rfl: 0 .  venlafaxine XR (EFFEXOR-XR) 75 MG 24 hr capsule, TAKE 2 CAPSULES BY MOUTH DAILY. (Patient taking differently: Take 150 mg by mouth daily with breakfast.), Disp: 180 capsule, Rfl: 1  Imaging Review  Cervical Imaging: Cervical CT wo contrast: Results for orders placed during the hospital encounter of 12/07/16 CT Cervical Spine Wo Contrast  Narrative CLINICAL DATA:  Fall with right forehead hematoma  EXAM: CT HEAD WITHOUT CONTRAST  CT CERVICAL SPINE WITHOUT CONTRAST  TECHNIQUE: Multidetector CT imaging of the head and cervical spine was performed following the standard protocol without intravenous contrast. Multiplanar CT image reconstructions of the cervical spine were also generated.  COMPARISON:  08/28/2016, 08/23/2016, 08/22/2016  FINDINGS: CT HEAD FINDINGS  Brain: No large vessel territorial infarct or mass is seen. Scattered areas of subarachnoid hemorrhage within the bilateral right greater than left frontal lobes no midline  shift. The ventricles are nonenlarged. Mild white matter small vessel ischemic changes.  Vascular: No hyperdense vessels. Stable calcifications at the right MCA bifurcation. Carotid artery calcification.  Skull: No definite fracture.  No suspicious bone lesion.  Sinuses/Orbits: Mild mucosal thickening in the ethmoid sinuses. No acute orbital abnormality.  Other: Large right frontal scalp hematoma and laceration.  CT CERVICAL SPINE FINDINGS  Alignment: Minimal 2 mm anterolisthesis of C3 on C4. Straightening of the cervical spine. Facet alignment is within normal limits.  Skull base and vertebrae: Craniovertebral junction appears intact. The vertebral body heights are normal. No fracture is visualized.  Soft tissues and spinal canal: No prevertebral fluid or swelling. No visible canal hematoma.  Disc levels: Moderate to severe  degenerative disc changes at C4-C5, C5-C6 and C6-C7. Multilevel bilateral facet arthropathy. Multilevel bilateral foraminal stenosis, new most marked between C4 and C7.  Upper chest: Lung apices demonstrate emphysema. No evidence for a thyroid mass. Carotid artery calcifications.  Other: None  IMPRESSION: 1. Scattered areas of subarachnoid hemorrhage within the bilateral frontal lobes, right greater than left. No midline shift. Normal ventricle size. Large right frontal scalp hematoma without underlying fracture. 2. Mild periventricular white matter small vessel ischemic change 3. Minimal anterolisthesis of C3 on C4 suspected to be due to degenerative change. No acute fracture or malalignment is seen. 4. Mild emphysema at the lung apices Critical Value/emergent results were called by telephone at the time of interpretation on 12/08/2016 at 12:47 am to Dr. Marjean Donna , who verbally acknowledged these results.   Electronically Signed By: Donavan Foil M.D. On: 12/08/2016 00:47  Thoracic Imaging: Thoracic DG 2-3 views: Results for orders  placed during the hospital encounter of 08/03/20 DG Thoracic Spine 2 View  Narrative CLINICAL DATA:  Fall, back pain  EXAM: THORACIC SPINE 2 VIEWS  COMPARISON:  None.  FINDINGS: Two view radiograph thoracic spine demonstrates normal thoracic kyphosis. No acute fracture or listhesis of the thoracic spine. Vertebral body height and intervertebral disc height have been preserved. Mild endplate remodeling is seen within the mid and lower thoracic spine in keeping with changes of mild degenerative disc disease. Incidentally noted is a remote L1 compression deformity with approximately 20% loss of height. The paraspinal soft tissues are unremarkable. Note is made of an implanted loop recorder.  IMPRESSION: No acute fracture or listhesis of the thoracic spine.   Electronically Signed By: Fidela Salisbury MD On: 08/03/2020 20:22  Lumbosacral Imaging: Lumbar MR wo contrast: Results for orders placed during the hospital encounter of 04/20/20 MR LUMBAR SPINE WO CONTRAST  Narrative CLINICAL DATA:  Low back pain extending into the buttocks bilaterally. Pain extends into the thighs with burning sensation in both thighs.  EXAM: MRI LUMBAR SPINE WITHOUT CONTRAST  TECHNIQUE: Multiplanar, multisequence MR imaging of the lumbar spine was performed. No intravenous contrast was administered.  COMPARISON:  Lumbar spine radiographs 03/15/2020  FINDINGS: Segmentation: 5 non rib-bearing lumbar type vertebral bodies are present. The lowest fully formed vertebral body is L5.  Alignment: Slight anterolisthesis is present at L3-4 and L4-5. Minimal retrolisthesis present at L2-3.  Vertebrae: A remote superior endplate fractures present at L1. Chronic fatty endplate marrow changes are present at L4-5 bilaterally. Marrow signal and vertebral body heights are otherwise normal.  Conus medullaris and cauda equina: Conus extends to the L1 level. Conus and cauda equina appear  normal.  Paraspinal and other soft tissues: Limited imaging the abdomen is unremarkable. There is no significant adenopathy. No solid organ lesions are present.  Disc levels:  T12-L1: A mild broad-based disc protrusion is present. Facet hypertrophy is noted bilaterally. No significant stenosis present.  L1-2: A mild broad-based disc protrusion is present. Facet hypertrophy is worse on the right. No significant stenosis is present.  L2-3: Extruded disc material extends 11 mm superiorly in the right lateral recess. Broad-based disc protrusion is present. Moderate facet hypertrophy and ligamentum flavum thickening is noted. This results in moderate central canal stenosis with crowding of the nerve roots. Moderate foraminal narrowing is worse on the right.  L3-4: A broad-based disc protrusion is present. Moderate facet hypertrophy and ligamentum flavum thickening contributes to severe central canal stenosis with crowding of nerve roots above this level. Moderate foraminal narrowing is present  bilaterally.  L4-5: A broad-based disc protrusion is present. Moderate facet hypertrophy is worse on left. This results in severe central canal stenosis. Severe left and moderate right foraminal narrowing is present.  L5-S1: A mild broad-based disc bulge is present. Moderate facet hypertrophy present on the right. This leads to mild right foraminal stenosis.  IMPRESSION: 1. Moderate central and bilateral foraminal stenosis at L2-3 is worse on the right. A broad-based disc protrusion present with the superior disc extrusion extending 11 mm above the inferior endplate of L2 in the right lateral recess. 2. Severe central canal stenosis at L3-4 and L4-5 with crowding of the nerve roots above both levels. 3. Moderate foraminal narrowing bilaterally at L3-4. 4. Severe left and moderate right foraminal stenosis at L4-5. 5. Mild right foraminal narrowing at L5-S1. 6. Remote superior endplate  compression fracture at L1.   Electronically Signed By: San Morelle M.D. On: 04/20/2020 22:27  Lumbar DG 2-3 views: Results for orders placed during the hospital encounter of 08/03/20 DG Lumbar Spine 2-3 Views  Narrative CLINICAL DATA:  Fall, low back pain  EXAM: LUMBAR SPINE - 2-3 VIEW  COMPARISON:  03/15/2020  FINDINGS: Five non rib bearing segments of the lumbar spine. Normal lumbar lordosis. Remote anterior wedge compression deformity of L1 with approximately 20% loss of height is unchanged. No acute fracture or listhesis of the lumbar spine. Remaining vertebral body height has been preserved. There is diffuse intervertebral disc space narrowing and endplate remodeling throughout the lumbar spine, most severe at L4-5, in keeping with changes of moderate to severe degenerative disc disease. Facet arthrosis is noted throughout the lumbar spine, not well characterized on this examination. Atherosclerotic calcification is seen within the abdominal aorta. The paraspinal soft tissues are otherwise unremarkable.  IMPRESSION: No acute fracture or listhesis of the lumbar spine.  Stable mild L1 compression deformity.  Diffuse moderate to severe degenerative disc and degenerative joint disease throughout the lumbar spine.   Electronically Signed By: Fidela Salisbury MD On: 08/03/2020 20:20  Lumbar DG (Complete) 4+V: Results for orders placed during the hospital encounter of 03/15/20 DG Lumbar Spine Complete  Narrative CLINICAL DATA:  Progressive low back pain  EXAM: LUMBAR SPINE - COMPLETE 4+ VIEW  COMPARISON:  August 13, 2009 lumbar radiographs. CT abdomen and pelvis with bony reformats June 19, 2014  FINDINGS: Frontal, lateral, spot lumbosacral lateral, and bilateral oblique views were obtained. There are 5 non-rib-bearing lumbar type vertebral bodies. There is anterior wedging of the L1 vertebral body, age uncertain but not present on available  prior studies. No other fracture. No evidence spondylolisthesis. There is severe disc space narrowing at L2-3, L3-4, L4-5, and L5-S1, progressed from prior studies. There is facet osteoarthritic change at all levels, most notably at L3-4, L4-5, and L5-S1 bilaterally. There is aortic and iliac artery atherosclerosis.  IMPRESSION: Anterior wedging of the L1 vertebral body, age uncertain but not present on available prior studies, most recent from 28. No other fracture. No spondylolisthesis. Multilevel osteoarthritic change, progressed from prior studies.  Aortic Atherosclerosis (ICD10-I70.0).   Electronically Signed By: Lowella Grip III M.D. On: 03/16/2020 10:02   Hip Imaging: Hip-R DG 2-3 views: Results for orders placed during the hospital encounter of 08/03/20 DG Hip Unilat W or Wo Pelvis 2-3 Views Right  Narrative CLINICAL DATA:  Fall, right hip pain  EXAM: DG HIP (WITH OR WITHOUT PELVIS) 2-3V RIGHT  COMPARISON:  None.  FINDINGS: Single view radiograph pelvis and two view radiograph of the right hip  demonstrates normal alignment. No fracture or dislocation. Mild bilateral degenerative hip arthritis is present with asymmetric joint space narrowing. Vascular calcifications are seen within the pelvis. Degenerative changes are seen within the lower lumbar spine, not well characterized on this examination.  IMPRESSION: No acute fracture or dislocation.   Electronically Signed By: Fidela Salisbury MD On: 08/03/2020 20:16  Knee Imaging: Knee-R MR wo contrast: Results for orders placed during the hospital encounter of 03/19/16 MR Knee Right Wo Contrast  Narrative CLINICAL DATA:  Right knee pain since falling on 02/13/2016. EXAM: MRI OF THE RIGHT KNEE WITHOUT CONTRAST TECHNIQUE: Multiplanar, multisequence MR imaging of the knee was performed. No intravenous contrast was administered. COMPARISON:  None. FINDINGS: MENISCI Medial meniscus: Severely degenerated  and torn posterior horn and mid body region. There is a flap type tear in the mid body region with a flipped meniscal fragment in the medial gutter. The posterior horn appears macerated. Lateral meniscus:  Intact LIGAMENTS Cruciates:  Intact Collaterals:  Intact CARTILAGE Patellofemoral: Moderate degenerative chondrosis/ chondromalacia with early joint space narrowing and spurring. Medial: Severe degenerative chondrosis with areas of full or near full-thickness cartilage loss, joint space narrowing, osteophytic spurring and marked stress edema in the tibia. Lateral:  Mild degenerative chondrosis. Joint:  Moderate-sized joint effusion and mild synovitis. Popliteal Fossa:  Moderate to large Baker's cyst. Extensor Mechanism: The patella retinacular structures are intact and the quadriceps and patellar tendons are intact. Bones: There is a subchondral stress fracture or small focus of spontaneous osteonecrosis involving the medial tibial plateau with a small depressed fragment. Significant surrounding marrow edema. Other: None IMPRESSION: 1. Severely degenerated and torn medial meniscus as discussed above. The meniscus is extruded medially and dysfunctional. 2. Significant medial compartment degenerative changes. 3. Subchondral stress fracture versus small focus of spontaneous osteonecrosis involving the medial tibial plateau with slight depression and significant surrounding marrow edema. 4. Intact ligamentous structures. 5. Moderate MCL and pes anserine bursitis. 6. Moderate-sized joint effusion and moderate to large Baker's cyst. Electronically Signed By: Marijo Sanes M.D. On: 03/19/2016 12:28  Knee-R CT wo contrast: Results for orders placed during the hospital encounter of 06/06/16 CT KNEE RIGHT WO CONTRAST  Narrative CLINICAL DATA:  Right knee pain for 4 months. No known injury. pre-op for knee replacement (MY Knee protocol)  EXAM: CT OF THE RIGHT KNEE WITHOUT  CONTRAST  TECHNIQUE: Multidetector CT imaging of the right knee was performed according to the standard protocol. Multiplanar CT image reconstructions were also generated.  COMPARISON:  MRI 03/19/2016.  FINDINGS: Bones/Joint/Cartilage  There is progressive subchondral sclerosis and fragmentation of the medial tibial plateau compared with previous MRI. Underlying subchondral insufficiency fracture is best seen on the coronal images. There is stable medial compartment joint space narrowing. No new fractures are identified. There is a small knee joint effusion.  This protocol includes axial imaging through the right hip and ankle for surgical planning. No significant findings are present at the right hip. There is mild osteitis pubis. There are mild degenerative changes at the ankle and calcaneal cuboid joint.  Ligaments  Not relevant for exam/indication.  Muscles and Tendons  Unremarkable.  The extensor mechanism is intact.  Soft tissues  Moderate-sized Baker's cyst is similar to the previous study. Femoral popliteal atherosclerosis noted.  IMPRESSION: 1. Imaging for surgical planning for knee arthroplasty. 2. Progressive subchondral sclerosis and fragmentation of the medial tibial plateau from underlying subchondral insufficiency fracture. 3. Moderate-sized Baker's cyst.   Electronically Signed By: Richardean Sale M.D. On:  06/06/2016 16:12  Knee-R DG 1-2 views: Results for orders placed during the hospital encounter of 07/03/16 DG Knee 1-2 Views Right  Narrative CLINICAL DATA:  Status post right total knee replacement.  EXAM: RIGHT KNEE - 1-2 VIEW  COMPARISON:  Radiographs of April 19, 2016.  FINDINGS: The femoral and tibial components appear to be well situated. No fracture or dislocation is noted. Expected postoperative findings are noted in the anterior soft tissues.  IMPRESSION: Status post right total knee arthroplasty.   Electronically  Signed By: Marijo Conception, M.D. On: 07/03/2016 09:51  Knee-R DG 3 views: Results for orders placed during the hospital encounter of 04/19/16 DG Knee 2 Views Right  Narrative CLINICAL DATA:  Elective surgery.  EXAM: DG C-ARM 61-120 MIN; RIGHT KNEE - 3 VIEW  Radiation exposure index:  4.8 mGy.  COMPARISON:  MRI of March 19, 2016.  FINDINGS: Two intraoperative fluoroscopic images of the right knee demonstrate mild narrowing of the medial joint space.  IMPRESSION: Mild narrowing of medial joint space.   Electronically Signed By: Marijo Conception, M.D. On: 04/19/2016 10:22  Complexity Note: Imaging results reviewed. Results shared with [name] Douglas, using Layman's terms.                        ROS  Cardiovascular: Abnormal heart rhythm, High blood pressure, Heart murmur and Blood thinners:  Anticoagulant Pulmonary or Respiratory: Difficulty blowing air out (Emphysema), Shortness of breath, Smoking and No reported pulmonary signs or symptoms such as wheezing and difficulty taking a deep full breath (Asthma), difficulty blowing air out (Emphysema), coughing up mucus (Bronchitis), persistent dry cough, or temporary stoppage of breathing during sleep Neurological: Stroke (Residual deficits or weakness: none) Psychological-Psychiatric: Anxiousness Gastrointestinal: No reported gastrointestinal signs or symptoms such as vomiting or evacuating blood, reflux, heartburn, alternating episodes of diarrhea and constipation, inflamed or scarred liver, or pancreas or irrregular and/or infrequent bowel movements Genitourinary: No reported renal or genitourinary signs or symptoms such as difficulty voiding or producing urine, peeing blood, non-functioning kidney, kidney stones, difficulty emptying the bladder, difficulty controlling the flow of urine, or chronic kidney disease Hematological: No reported hematological signs or symptoms such as prolonged bleeding, low or poor functioning platelets,  bruising or bleeding easily, hereditary bleeding problems, low energy levels due to low hemoglobin or being anemic Endocrine: No reported endocrine signs or symptoms such as high or low blood sugar, rapid heart rate due to high thyroid levels, obesity or weight gain due to slow thyroid or thyroid disease Rheumatologic: No reported rheumatological signs and symptoms such as fatigue, joint pain, tenderness, swelling, redness, heat, stiffness, decreased range of motion, with or without associated rash Musculoskeletal: Negative for myasthenia gravis, muscular dystrophy, multiple sclerosis or malignant hyperthermia Work History: Retired  Allergies  [name] Douglas is allergic to heparin.  Laboratory Chemistry Profile   Renal Lab Results  Component Value Date   BUN 18 08/04/2020   CREATININE 0.85 08/04/2020   BCR 16 05/07/2019   GFRAA >60 11/06/2019   GFRNONAA >60 08/04/2020   SPECGRAV CANCELED 04/07/2020   PHUR CANCELED 04/07/2020   PROTEINUR CANCELED 04/07/2020     Electrolytes Lab Results  Component Value Date   NA 144 08/04/2020   K 3.8 08/04/2020   CL 107 08/04/2020   CALCIUM 9.4 08/04/2020   MG 2.1 09/07/2020     Hepatic Lab Results  Component Value Date   AST 13 (L) 08/04/2020   ALT 10 08/04/2020  ALBUMIN 3.7 08/04/2020   ALKPHOS 52 08/04/2020   LIPASE 18 08/20/2014     ID Lab Results  Component Value Date   HIV NONREACTIVE 08/24/2014   SARSCOV2NAA NEGATIVE 08/03/2020   STAPHAUREUS NEGATIVE 07/19/2020   MRSAPCR NEGATIVE 07/19/2020   HCVAB NEGATIVE 08/24/2014     Bone Lab Results  Component Value Date   VD25OH 8.6 (L) 05/07/2019   TESTOSTERONE 16 04/29/2018     Endocrine Lab Results  Component Value Date   GLUCOSE 110 (H) 08/04/2020   GLUCOSEU CANCELED 04/07/2020   HGBA1C 6.0 (H) 07/15/2020   TSH 0.654 05/07/2019   FREET4 1.10 05/07/2019   TESTOSTERONE 16 04/29/2018     Neuropathy Lab Results  Component Value Date   VITAMINB12 113 (L) 09/07/2020    FOLATE 13.6 02/12/2018   HGBA1C 6.0 (H) 07/15/2020   HIV NONREACTIVE 08/24/2014     CNS No results found for: COLORCSF, APPEARCSF, RBCCOUNTCSF, WBCCSF, POLYSCSF, LYMPHSCSF, EOSCSF, PROTEINCSF, GLUCCSF, JCVIRUS, CSFOLI, IGGCSF, LABACHR, ACETBL, LABACHR, ACETBL   Inflammation (CRP: Acute  ESR: Chronic) Lab Results  Component Value Date   CRP 3 01/04/2020   ESRSEDRATE 17 01/04/2020   LATICACIDVEN 1.7 04/23/2016     Rheumatology No results found for: RF, ANA, LABURIC, URICUR, LYMEIGGIGMAB, LYMEABIGMQN, HLAB27   Coagulation Lab Results  Component Value Date   INR 1.0 07/19/2020   LABPROT 12.8 07/19/2020   APTT 39 (H) 07/19/2020   PLT 247 08/04/2020     Cardiovascular Lab Results  Component Value Date   TROPONINI <0.03 12/20/2017   HGB 10.6 (L) 08/04/2020   HCT 32.3 (L) 08/04/2020     Screening Lab Results  Component Value Date   SARSCOV2NAA NEGATIVE 08/03/2020   STAPHAUREUS NEGATIVE 07/19/2020   MRSAPCR NEGATIVE 07/19/2020   HCVAB NEGATIVE 08/24/2014   HIV NONREACTIVE 08/24/2014     Cancer No results found for: CEA, CA125, LABCA2   Allergens No results found for: ALMOND, APPLE, ASPARAGUS, AVOCADO, BANANA, BARLEY, BASIL, BAYLEAF, GREENBEAN, LIMABEAN, WHITEBEAN, BEEFIGE, REDBEET, BLUEBERRY, BROCCOLI, CABBAGE, MELON, CARROT, CASEIN, CASHEWNUT, CAULIFLOWER, CELERY     Note: Lab results reviewed.  PFSH  Drug: [name] Douglas  reports no history of drug use. Alcohol:  reports no history of alcohol use. Tobacco:  reports that she has been smoking cigarettes. She has a 13.25 pack-year smoking history. She has never used smokeless tobacco. Medical:  has a past medical history of Anxiety, Arthritis, Asthma, Chronic lower back pain, Complication of anesthesia, COPD (chronic obstructive pulmonary disease) ([place]), DDD (degenerative disc disease), lumbar, Depression, Diverticulosis, GERD (gastroesophageal reflux disease), High cholesterol, Hypertension, Hypothyroidism, Memory loss, On  home oxygen therapy, Peripheral vascular disease ([place]), Pre-diabetes, Shortness of breath dyspnea, and Stroke ([place]). Family: family history includes Congestive Heart Failure in her father and sister; Diabetes in her mother; Hypertension in her mother; Lung cancer in her mother.  Past Surgical History:  Procedure Laterality Date  . CARDIAC CATHETERIZATION  ~ 2007  . EXCISIONAL HEMORRHOIDECTOMY  1960's  . EYE SURGERY Bilateral    Cataract extraction with IOL  . KNEE ARTHROSCOPY Left X 2 <2008  . KNEE ARTHROSCOPY WITH SUBCHONDROPLASTY Right 04/19/2016   Procedure: KNEE ARTHROSCOPY WITH SUBCHONDROPLASTY, PARTIAL MENISCECTOMY;  Surgeon: Hessie Knows, MD;  Location: [facility];  Service: Orthopedics;  Laterality: Right;  . LOOP RECORDER INSERTION N/A 01/02/2018   Procedure: LOOP RECORDER INSERTION;  Surgeon: Deboraha Sprang, MD;  Location: [facility];  Service: Cardiovascular;  Laterality: N/A;  . SHOULDER OPEN ROTATOR CUFF REPAIR Right 2001  .  TONSILLECTOMY  ~ 1950  . TOTAL KNEE ARTHROPLASTY Left 2008  . TOTAL KNEE ARTHROPLASTY Right 07/03/2016   Procedure: TOTAL KNEE ARTHROPLASTY;  Surgeon: Hessie Knows, MD;  Location: [facility] OR[facility];  Service: Orthopedics;  Laterality: Right;  . TUBAL LIGATION  1973  . VAGINAL HYSTERECTOMY  1974   Active Ambulatory Problems    Diagnosis Date Noted  . Diarrhea 08/20/2014  . Essential hypertension 08/20/2014  . Hyperlipidemia 08/20/2014  . Hypothyroidism 08/20/2014  . Tobacco abuse 08/20/2014  . COPD (chronic obstructive pulmonary disease) ([place]) 08/20/2014  . Enteritis due to Clostridium difficile 08/22/2014  . Colitis due to Clostridium difficile   . Recurrent Clostridium difficile diarrhea   . Ileus ([place]) 04/24/2016  . Primary localized osteoarthritis of right knee 07/03/2016  . L brain TIA (transient ischemic attack) 08/24/2016  . Solitary pulmonary nodule 08/24/2016  . Occlusion and stenosis of bilateral carotid arteries 11/24/2017  . Encounter  for general adult medical examination with abnormal findings 04/05/2019  . Nausea and vomiting 04/05/2019  . Dysuria 04/05/2019  . Need for vaccination against Streptococcus pneumoniae using pneumococcal conjugate vaccine 7 04/05/2019  . Iron deficiency anemia 05/17/2019  . Vitamin D deficiency 05/17/2019  . Flu vaccine need 05/17/2019  . Intractable episodic cluster headache 09/20/2019  . Other symptoms and signs involving the nervous system 09/20/2019  . History of CVA (cerebrovascular accident) 10/07/2019  . Acute diarrhea 07/27/2014  . Subarachnoid hemorrhage ([place]) 12/09/2016  . Atopic dermatitis 10/28/2019  . Dizziness 11/06/2019  . Muscle spasm 04/10/2020  . Urinary tract infection with hematuria 04/10/2020  . Headache disorder 12/21/2019  . CVA (cerebral vascular accident) ([place]) 07/14/2020  . Blurred vision 07/14/2020  . Intractable low back pain 08/03/2020  . Fall down stairs 08/03/2020  . Hemorrhagic stroke ([place]) 08/03/2020  . Lumbar central spinal stenosis (Moderate: L2-3  Severe: L3-4, L4-5) w/ neurogenic claudication 05/31/2020  . History of stroke 12/21/2019  . Chronic pain syndrome 09/07/2020  . Pharmacologic therapy 09/07/2020  . Disorder of skeletal system 09/07/2020  . Problems influencing health status 09/07/2020  . Uncomplicated opioid dependence ([place]) 09/07/2020  . Chronic low back pain (2ry area of Pain) (Bilateral) (R>L) w/ sciatica (Right) 09/07/2020  . Chronic lower extremity pain (1ry area of Pain) (Right) 09/07/2020  . Chronic lumbar radiculopathy (Right) 09/07/2020  . Lumbosacral radiculopathy at L2 (Right) 09/07/2020  . Lumbar radiculitis (L2 dermatome) (Right) 09/07/2020  . Unspecified inflammatory spondylopathy, lumbar region ([place]) 09/07/2020  . Chronic anticoagulation (Plavix) 09/07/2020  . Abnormal MRI, lumbar spine (04/20/2020) 09/07/2020  . Lumbar foraminal stenosis (Bilateral: L2-3, L3-4, L4-5) (Right: L5-S1) (Severe left L4-5) 09/08/2020  .  Grade 1 Lumbar Anterolisthesis (L3/4 and L4/5) 09/08/2020  . Grade 1 Lumbar Retrolisthesis (L2/3) 09/08/2020  . Protrusion of lumbar intervertebral disc (Multilevel) 09/08/2020  . Compression fracture of L1 lumbar vertebra, sequela 09/08/2020  . Other intervertebral disc degeneration, lumbar region 09/08/2020  . Lumbosacral facet syndrome (Multilevel) (Bilateral) 09/08/2020  . Lumbar facet hypertrophy (Multilevel) (Bilateral) 09/08/2020  . Ligamentum flavum hypertrophy 09/08/2020  . Lumbar lateral recess stenosis (Right: L2-3) 09/08/2020  . DDD (degenerative disc disease), lumbosacral 09/08/2020  . Compression fracture of L5 vertebra ([place]) 09/08/2020  . Osteoporosis with pathological fracture of lumbar vertebra ([place]) 09/08/2020   Resolved Ambulatory Problems    Diagnosis Date Noted  . Generalized abdominal pain   . Cellulitis of right knee 07/17/2016  . Aphasia 06/02/2020  . Acute CVA (cerebrovascular accident) ([place]) 12/20/2017   Past Medical History:  Diagnosis Date  .  Anxiety   . Arthritis   . Asthma   . Chronic lower back pain   . Complication of anesthesia   . DDD (degenerative disc disease), lumbar   . Depression   . Diverticulosis   . GERD (gastroesophageal reflux disease)   . High cholesterol   . Hypertension   . Memory loss   . On home oxygen therapy   . Peripheral vascular disease ([place])   . Pre-diabetes   . Shortness of breath dyspnea   . Stroke [facility])    Constitutional Exam  General appearance: Well nourished, well developed, and well hydrated. In no apparent acute distress Vitals:   09/07/20 1009  BP: (!) 149/62  Pulse: 92  Temp: (!) 96.6 F (35.9 C)  SpO2: 97%  Weight: 215 lb (97.5 kg)  Height: $Remove'5\' 6"'hJqtdHq$  (1.676 m)   BMI Assessment: Estimated body mass index is 34.7 kg/m as calculated from the following:   Height as of this encounter: $RemoveBeforeD'5\' 6"'[id number]$  (1.676 m).   Weight as of this encounter: 215 lb (97.5 kg).  BMI interpretation table: BMI level Category Range  association with higher incidence of chronic pain  <18 kg/m2 Underweight   18.5-24.9 kg/m2 Ideal body weight   25-29.9 kg/m2 Overweight Increased incidence by 20%  30-34.9 kg/m2 Obese (Class I) Increased incidence by 68%  35-39.9 kg/m2 Severe obesity (Class II) Increased incidence by 136%  >40 kg/m2 Extreme obesity (Class III) Increased incidence by 254%   Patient's current BMI Ideal Body weight  Body mass index is 34.7 kg/m. Ideal body weight: 59.3 kg (130 lb 11.7 oz) Adjusted ideal body weight: 74.6 kg (164 lb 7 oz)   BMI Readings from Last 4 Encounters:  09/07/20 34.70 kg/m  08/08/20 34.64 kg/m  08/04/20 34.59 kg/m  07/19/20 34.02 kg/m   Wt Readings from Last 4 Encounters:  09/07/20 215 lb (97.5 kg)  08/08/20 214 lb 9.6 oz (97.3 kg)  08/04/20 214 lb 4.6 oz (97.2 kg)  07/19/20 210 lb 12.2 oz (95.6 kg)   Psych/Mental status: Alert, oriented x 3 (person, place, & time)       Eyes: PERLA Respiratory: No evidence of acute respiratory distress  Assessment  Primary Diagnosis & Pertinent Problem List: The primary encounter diagnosis was Chronic pain syndrome. Diagnoses of Chronic lower extremity pain (1ry area of Pain) (Right), Grade 1 Lumbar Retrolisthesis (L2/3), Lumbar lateral recess stenosis (Right: L2-3), Lumbar foraminal stenosis (Bilateral: L2-3, L3-4, L4-5) (Right: L5-S1) (Severe left L4-5), Lumbar radiculitis (L2 dermatome) (Right), Chronic lumbar radiculopathy (Right), Lumbosacral radiculopathy at L2 (Right), Lumbar central spinal stenosis (Moderate: L2-3  Severe: L3-4, L4-5) w/ neurogenic claudication, Ligamentum flavum hypertrophy, Chronic low back pain (2ry area of Pain) (Bilateral) (R>L) w/ sciatica (Right), DDD (degenerative disc disease), lumbosacral, Lumbar facet hypertrophy (Multilevel) (Bilateral), Lumbosacral facet syndrome (Multilevel) (Bilateral), Anterolisthesis of lumbar spine (L3/4 and L4/5), Compression fracture of L1 lumbar vertebra, sequela, Compression  fracture of L5 vertebra, initial encounter (HCC), Protrusion of lumbar intervertebral disc (Multilevel), Unspecified inflammatory spondylopathy, lumbar region [facility]), Other intervertebral disc degeneration, lumbar region, Osteoporosis with pathological fracture of lumbar vertebra (HCC), Abnormal MRI, lumbar spine (04/20/2020), Pharmacologic therapy, Disorder of skeletal system, Problems influencing health status, Uncomplicated opioid dependence ([place]), Vitamin D deficiency, and Chronic anticoagulation (Plavix) were also pertinent to this visit.  Visit Diagnosis (New problems to examiner): 1. Chronic pain syndrome   2. Chronic lower extremity pain (1ry area of Pain) (Right)   3. Grade 1 Lumbar Retrolisthesis (L2/3)   4. Lumbar lateral recess stenosis (  Right: L2-3)   5. Lumbar foraminal stenosis (Bilateral: L2-3, L3-4, L4-5) (Right: L5-S1) (Severe left L4-5)   6. Lumbar radiculitis (L2 dermatome) (Right)   7. Chronic lumbar radiculopathy (Right)   8. Lumbosacral radiculopathy at L2 (Right)   9. Lumbar central spinal stenosis (Moderate: L2-3  Severe: L3-4, L4-5) w/ neurogenic claudication   10. Ligamentum flavum hypertrophy   11. Chronic low back pain (2ry area of Pain) (Bilateral) (R>L) w/ sciatica (Right)   12. DDD (degenerative disc disease), lumbosacral   13. Lumbar facet hypertrophy (Multilevel) (Bilateral)   14. Lumbosacral facet syndrome (Multilevel) (Bilateral)   15. Anterolisthesis of lumbar spine (L3/4 and L4/5)   16. Compression fracture of L1 lumbar vertebra, sequela   17. Compression fracture of L5 vertebra, initial encounter ([place])   18. Protrusion of lumbar intervertebral disc (Multilevel)   19. Unspecified inflammatory spondylopathy, lumbar region ([place])   20. Other intervertebral disc degeneration, lumbar region   21. Osteoporosis with pathological fracture of lumbar vertebra ([place])   22. Abnormal MRI, lumbar spine (04/20/2020)   23. Pharmacologic therapy   24. Disorder of  skeletal system   25. Problems influencing health status   26. Uncomplicated opioid dependence ([place])   27. Vitamin D deficiency   28. Chronic anticoagulation (Plavix)    Plan of Care (Initial workup plan)  Note: Ms. Bober was reminded that as per protocol, today's visit has been an evaluation only. We have not taken over the patient's controlled substance management.  Problem-specific plan: No problem-specific Assessment & Plan notes found for this encounter.   Lab Orders     Magnesium     Vitamin B12     25-Hydroxy vitamin D Lcms D2+D3  Imaging Orders     DG Lumbar Spine Complete W/Bend     DG BONE DENSITY W/VFA Referral Orders  No referral(s) requested today   Procedure Orders    No procedure(s) ordered today   Pharmacotherapy (current): Medications ordered:  No orders of the defined types were placed in this encounter.  Medications administered during this visit: [name] had no medications administered during this visit.   Pharmacological management options:  Opioid Analgesics: The patient was informed that there is no guarantee that she would be a candidate for opioid analgesics. The decision will be made following CDC guidelines. This decision will be based on the results of diagnostic studies, as well as [name] Douglas risk profile.   Membrane stabilizer: To be determined at a later time  Muscle relaxant: To be determined at a later time  NSAID: To be determined at a later time  Other analgesic(s): To be determined at a later time   Interventional management options: [name] Douglas was informed that there is no guarantee that she would be a candidate for interventional therapies. The decision will be based on the results of diagnostic studies, as well as [name] Douglas risk profile.  Procedure(s) under consideration:  Diagnostic/therapeutic right L1 & L2 transforaminal ESI #1    Provider-requested follow-up: Return for (F2F), (2V) post-test eval (40 min),  (s/p Tests).  Future Appointments  Date Time Provider [place]  09/12/2020  9:00 AM [facility] DEVICE REMOTES [facility]  09/13/2020  8:20 AM Deboraha Sprang, MD [facility]  09/19/2020  8:20 AM Milinda Pointer, MD [facility] None  10/17/2020  9:00 AM [facility] DEVICE REMOTES [facility]  11/11/2020 11:30 AM Luiz Ochoa, NP [facility] None  04/10/2021  3:00 PM Luiz Ochoa, NP [facility] None    Note  by: Gaspar Cola, MD Date: 09/07/2020; Time: 4:33 PM

## 2020-09-07 ENCOUNTER — Ambulatory Visit
Admission: RE | Admit: 2020-09-07 | Discharge: 2020-09-07 | Disposition: A | Discharge status: Home / Self Care | Payer: Medicare Other | Source: Ambulatory Visit | Attending: Pain Medicine | Admitting: Pain Medicine

## 2020-09-07 ENCOUNTER — Ambulatory Visit
Admission: RE | Admit: 2020-09-07 | Discharge: 2020-09-07 | Disposition: A | Discharge status: Home / Self Care | Payer: Medicare Other | Attending: Pain Medicine | Admitting: Pain Medicine

## 2020-09-07 ENCOUNTER — Other Ambulatory Visit
Admission: RE | Admit: 2020-09-07 | Discharge: 2020-09-07 | Disposition: A | Discharge status: Home / Self Care | Payer: Medicare Other | Source: Home / Self Care | Attending: Pain Medicine | Admitting: Pain Medicine

## 2020-09-07 ENCOUNTER — Other Ambulatory Visit: Payer: Self-pay

## 2020-09-07 ENCOUNTER — Ambulatory Visit (HOSPITAL_BASED_OUTPATIENT_CLINIC_OR_DEPARTMENT_OTHER): Payer: Medicare Other | Admitting: Pain Medicine

## 2020-09-07 ENCOUNTER — Encounter: Payer: Self-pay | Admitting: Pain Medicine

## 2020-09-07 VITALS — BP 149/62 | HR 92 | Temp 96.6°F | Ht 66.0 in | Wt 215.0 lb

## 2020-09-07 DIAGNOSIS — M5416 Radiculopathy, lumbar region: Secondary | ICD-10-CM

## 2020-09-07 DIAGNOSIS — M5417 Radiculopathy, lumbosacral region: Secondary | ICD-10-CM

## 2020-09-07 DIAGNOSIS — M48062 Spinal stenosis, lumbar region with neurogenic claudication: Secondary | ICD-10-CM

## 2020-09-07 DIAGNOSIS — R937 Abnormal findings on diagnostic imaging of other parts of musculoskeletal system: Secondary | ICD-10-CM | POA: Insufficient documentation

## 2020-09-07 DIAGNOSIS — M47816 Spondylosis without myelopathy or radiculopathy, lumbar region: Secondary | ICD-10-CM

## 2020-09-07 DIAGNOSIS — E538 Deficiency of other specified B group vitamins: Secondary | ICD-10-CM

## 2020-09-07 DIAGNOSIS — M5441 Lumbago with sciatica, right side: Secondary | ICD-10-CM | POA: Insufficient documentation

## 2020-09-07 DIAGNOSIS — Z789 Other specified health status: Secondary | ICD-10-CM

## 2020-09-07 DIAGNOSIS — M8008XA Age-related osteoporosis with current pathological fracture, vertebra(e), initial encounter for fracture: Secondary | ICD-10-CM

## 2020-09-07 DIAGNOSIS — Z7901 Long term (current) use of anticoagulants: Secondary | ICD-10-CM | POA: Insufficient documentation

## 2020-09-07 DIAGNOSIS — M4310 Spondylolisthesis, site unspecified: Secondary | ICD-10-CM

## 2020-09-07 DIAGNOSIS — G8929 Other chronic pain: Secondary | ICD-10-CM | POA: Insufficient documentation

## 2020-09-07 DIAGNOSIS — Z79899 Other long term (current) drug therapy: Secondary | ICD-10-CM | POA: Insufficient documentation

## 2020-09-07 DIAGNOSIS — M2428 Disorder of ligament, vertebrae: Secondary | ICD-10-CM

## 2020-09-07 DIAGNOSIS — F1120 Opioid dependence, uncomplicated: Secondary | ICD-10-CM | POA: Insufficient documentation

## 2020-09-07 DIAGNOSIS — M899 Disorder of bone, unspecified: Secondary | ICD-10-CM | POA: Insufficient documentation

## 2020-09-07 DIAGNOSIS — M4316 Spondylolisthesis, lumbar region: Secondary | ICD-10-CM | POA: Insufficient documentation

## 2020-09-07 DIAGNOSIS — M4696 Unspecified inflammatory spondylopathy, lumbar region: Secondary | ICD-10-CM | POA: Insufficient documentation

## 2020-09-07 DIAGNOSIS — S32010S Wedge compression fracture of first lumbar vertebra, sequela: Secondary | ICD-10-CM | POA: Insufficient documentation

## 2020-09-07 DIAGNOSIS — M5136 Other intervertebral disc degeneration, lumbar region: Secondary | ICD-10-CM

## 2020-09-07 DIAGNOSIS — G894 Chronic pain syndrome: Secondary | ICD-10-CM | POA: Insufficient documentation

## 2020-09-07 DIAGNOSIS — M5137 Other intervertebral disc degeneration, lumbosacral region: Secondary | ICD-10-CM | POA: Insufficient documentation

## 2020-09-07 DIAGNOSIS — E559 Vitamin D deficiency, unspecified: Secondary | ICD-10-CM

## 2020-09-07 DIAGNOSIS — S32010A Wedge compression fracture of first lumbar vertebra, initial encounter for closed fracture: Secondary | ICD-10-CM | POA: Insufficient documentation

## 2020-09-07 DIAGNOSIS — M47817 Spondylosis without myelopathy or radiculopathy, lumbosacral region: Secondary | ICD-10-CM | POA: Insufficient documentation

## 2020-09-07 DIAGNOSIS — M79604 Pain in right leg: Secondary | ICD-10-CM | POA: Insufficient documentation

## 2020-09-07 DIAGNOSIS — M5126 Other intervertebral disc displacement, lumbar region: Secondary | ICD-10-CM | POA: Insufficient documentation

## 2020-09-07 DIAGNOSIS — S32050A Wedge compression fracture of fifth lumbar vertebra, initial encounter for closed fracture: Secondary | ICD-10-CM | POA: Insufficient documentation

## 2020-09-07 DIAGNOSIS — I700 Atherosclerosis of aorta: Secondary | ICD-10-CM | POA: Diagnosis not present

## 2020-09-07 DIAGNOSIS — M48061 Spinal stenosis, lumbar region without neurogenic claudication: Secondary | ICD-10-CM

## 2020-09-07 LAB — MAGNESIUM: Magnesium: 2.1 mg/dL (ref 1.7–2.4)

## 2020-09-07 LAB — VITAMIN B12: Vitamin B-12: 113 pg/mL — ABNORMAL LOW (ref 180–914)

## 2020-09-07 NOTE — Progress Notes (Signed)
Safety precautions to be maintained throughout the outpatient stay will include: orient to surroundings, keep bed in low position, maintain call bell within reach at all times, provide assistance with transfer out of bed and ambulation.  

## 2020-09-07 NOTE — Patient Instructions (Signed)
____________________________________________________________________________________________  Medication Rules  Purpose: To inform patients, and their family members, of our rules and regulations.  Applies to: All patients receiving prescriptions (written or electronic).  Pharmacy of record: Pharmacy where electronic prescriptions will be sent. If written prescriptions are taken to a different pharmacy, please inform the nursing staff. The pharmacy listed in the electronic medical record should be the one where you would like electronic prescriptions to be sent.  Electronic prescriptions: In compliance with the [place] Strengthen Opioid Misuse Prevention (STOP) Act of 2017 (Session Law 2017-74/H243), effective August 20, 2018, all controlled substances must be electronically prescribed. Calling prescriptions to the pharmacy will cease to exist.  Prescription refills: Only during scheduled appointments. Applies to all prescriptions.  NOTE: The following applies primarily to controlled substances (Opioid* Pain Medications).   Type of encounter (visit): For patients receiving controlled substances, face-to-face visits are required. (Not an option or up to the patient.)  Patient's responsibilities: 1. Pain Pills: Bring all pain pills to every appointment (except for procedure appointments). 2. Pill Bottles: Bring pills in original pharmacy bottle. Always bring the newest bottle. Bring bottle, even if empty. 3. Medication refills: You are responsible for knowing and keeping track of what medications you take and those you need refilled. The day before your appointment: write a list of all prescriptions that need to be refilled. The day of the appointment: give the list to the admitting nurse. Prescriptions will be written only during appointments. No prescriptions will be written on procedure days. If you forget a medication: it will not be "Called in", "Faxed", or "electronically sent".  You will need to get another appointment to get these prescribed. No early refills. Do not call asking to have your prescription filled early. 4. Prescription Accuracy: You are responsible for carefully inspecting your prescriptions before leaving our office. Have the discharge nurse carefully go over each prescription with you, before taking them home. Make sure that your name is accurately spelled, that your address is correct. Check the name and dose of your medication to make sure it is accurate. Check the number of pills, and the written instructions to make sure they are clear and accurate. Make sure that you are given enough medication to last until your next medication refill appointment. 5. Taking Medication: Take medication as prescribed. When it comes to controlled substances, taking less pills or less frequently than prescribed is permitted and encouraged. Never take more pills than instructed. Never take medication more frequently than prescribed.  6. Inform other Doctors: Always inform, all of your healthcare providers, of all the medications you take. 7. Pain Medication from other Providers: You are not allowed to accept any additional pain medication from any other Doctor or Healthcare provider. There are two exceptions to this rule. (see below) In the event that you require additional pain medication, you are responsible for notifying us, as stated below. 8. Cough Medicine: Often these contain an opioid, such as codeine or hydrocodone. Never accept or take cough medicine containing these opioids if you are already taking an opioid* medication. The combination may cause respiratory failure and death. 9. Medication Agreement: You are responsible for carefully reading and following our Medication Agreement. This must be signed before receiving any prescriptions from our practice. Safely store a copy of your signed Agreement. Violations to the Agreement will result in no further prescriptions.  (Additional copies of our Medication Agreement are available upon request.) 10. Laws, Rules, & Regulations: All patients are expected to follow all   Federal and State Laws, Statutes, Rules, & Regulations. Ignorance of the Laws does not constitute a valid excuse.  11. Illegal drugs and Controlled Substances: The use of illegal substances (including, but not limited to marijuana and its derivatives) and/or the illegal use of any controlled substances is strictly prohibited. Violation of this rule may result in the immediate and permanent discontinuation of any and all prescriptions being written by our practice. The use of any illegal substances is prohibited. 12. Adopted CDC guidelines & recommendations: Target dosing levels will be at or below 60 MME/day. Use of benzodiazepines** is not recommended.  Exceptions: There are only two exceptions to the rule of not receiving pain medications from other Healthcare Providers. 1. Exception #1 (Emergencies): In the event of an emergency (i.e.: accident requiring emergency care), you are allowed to receive additional pain medication. However, you are responsible for: As soon as you are able, call our office [phone], at any time of the day or night, and leave a message stating your name, the date and nature of the emergency, and the name and dose of the medication prescribed. In the event that your call is answered by a member of our staff, make sure to document and save the date, time, and the name of the person that took your information.  2. Exception #2 (Planned Surgery): In the event that you are scheduled by another doctor or dentist to have any type of surgery or procedure, you are allowed (for a period no longer than 30 days), to receive additional pain medication, for the acute post-op pain. However, in this case, you are responsible for picking up a copy of our "Post-op Pain Management for Surgeons" handout, and giving it to your surgeon or dentist. This  document is available at our office, and does not require an appointment to obtain it. Simply go to our office during business hours (Monday-Thursday from 8:00 AM to 4:00 PM) (Friday 8:00 AM to 12:00 Noon) or if you have a scheduled appointment with us, prior to your surgery, and ask for it by name. In addition, you are responsible for: calling our office [phone], at any time of the day or night, and leaving a message stating your name, name of your surgeon, type of surgery, and date of procedure or surgery. Failure to comply with your responsibilities may result in termination of therapy involving the controlled substances.  *Opioid medications include: morphine, codeine, oxycodone, oxymorphone, hydrocodone, hydromorphone, meperidine, tramadol, tapentadol, buprenorphine, fentanyl, methadone. **Benzodiazepine medications include: diazepam (Valium), alprazolam (Xanax), clonazepam (Klonopine), lorazepam (Ativan), clorazepate (Tranxene), chlordiazepoxide (Librium), estazolam (Prosom), oxazepam (Serax), temazepam (Restoril), triazolam (Halcion) (Last updated: 07/18/2020) ____________________________________________________________________________________________   ____________________________________________________________________________________________  Medication Recommendations and Reminders  Applies to: All patients receiving prescriptions (written and/or electronic).  Medication Rules & Regulations: These rules and regulations exist for your safety and that of others. They are not flexible and neither are we. Dismissing or ignoring them will be considered "non-compliance" with medication therapy, resulting in complete and irreversible termination of such therapy. (See document titled "Medication Rules" for more details.) In all conscience, because of safety reasons, we cannot continue providing a therapy where the patient does not follow instructions.  Pharmacy of record:   Definition:  This is the pharmacy where your electronic prescriptions will be sent.   We do not endorse any particular pharmacy, however, we have experienced problems with [facility] not securing enough medication supply for the community.  We do not restrict you in your choice of pharmacy. However,   once we write for your prescriptions, we will NOT be re-sending more prescriptions to fix restricted supply problems created by your pharmacy, or your insurance.   The pharmacy listed in the electronic medical record should be the one where you want electronic prescriptions to be sent.  If you choose to change pharmacy, simply notify our nursing staff.  Recommendations:  Keep all of your pain medications in a safe place, under lock and key, even if you live alone. We will NOT replace lost, stolen, or damaged medication.  After you fill your prescription, take 1 week's worth of pills and put them away in a safe place. You should keep a separate, properly labeled bottle for this purpose. The remainder should be kept in the original bottle. Use this as your primary supply, until it runs out. Once it's gone, then you know that you have 1 week's worth of medicine, and it is time to come in for a prescription refill. If you do this correctly, it is unlikely that you will ever run out of medicine.  To make sure that the above recommendation works, it is very important that you make sure your medication refill appointments are scheduled at least 1 week before you run out of medicine. To do this in an effective manner, make sure that you do not leave the office without scheduling your next medication management appointment. Always ask the nursing staff to show you in your prescription , when your medication will be running out. Then arrange for the receptionist to get you a return appointment, at least 7 days before you run out of medicine. Do not wait until you have 1 or 2 pills left, to come in. This is very poor planning and  does not take into consideration that we may need to cancel appointments due to bad weather, sickness, or emergencies affecting our staff.  DO NOT ACCEPT A "Partial Fill": If for any reason your pharmacy does not have enough pills/tablets to completely fill or refill your prescription, do not allow for a "partial fill". The law allows the pharmacy to complete that prescription within 72 hours, without requiring a new prescription. If they do not fill the rest of your prescription within those 72 hours, you will need a separate prescription to fill the remaining amount, which we will NOT provide. If the reason for the partial fill is your insurance, you will need to talk to the pharmacist about payment alternatives for the remaining tablets, but again, DO NOT ACCEPT A PARTIAL FILL, unless you can trust your pharmacist to obtain the remainder of the pills within 72 hours.  Prescription refills and/or changes in medication(s):   Prescription refills, and/or changes in dose or medication, will be conducted only during scheduled medication management appointments. (Applies to both, written and electronic prescriptions.)  No refills on procedure days. No medication will be changed or started on procedure days. No changes, adjustments, and/or refills will be conducted on a procedure day. Doing so will interfere with the diagnostic portion of the procedure.  No phone refills. No medications will be "called into the pharmacy".  No Fax refills.  No weekend refills.  No Holliday refills.  No after hours refills.  Remember:  Business hours are:  Monday to Thursday 8:00 AM to 4:00 PM Provider's Schedule: Jaleel Allen, MD - Appointments are:  Medication management: Monday and Wednesday 8:00 AM to 4:00 PM Procedure day: Tuesday and Thursday 7:30 AM to 4:00 PM Bilal Lateef, MD - Appointments are:    Medication management: Tuesday and Thursday 8:00 AM to 4:00 PM Procedure day: Monday and Wednesday  7:30 AM to 4:00 PM (Last update: 03/09/2020) ____________________________________________________________________________________________   ____________________________________________________________________________________________  CBD (cannabidiol) WARNING  Applicable to: All individuals currently taking or considering taking CBD (cannabidiol) and, more important, all patients taking opioid analgesic controlled substances (pain medication). (Example: oxycodone; oxymorphone; hydrocodone; hydromorphone; morphine; methadone; tramadol; tapentadol; fentanyl; buprenorphine; butorphanol; dextromethorphan; meperidine; codeine; etc.)  Legal status: CBD remains a Schedule I drug prohibited for any use. CBD is illegal with one exception. In the [place], CBD has a limited Transport planner (FDA) approval for the treatment of two specific types of epilepsy disorders. Only one CBD product has been approved by the FDA for this purpose: "Epidiolex". FDA is aware that some companies are marketing products containing cannabis and cannabis-derived compounds in ways that violate the [employer], Drug and Cosmetic Act [facility] Act) and that may put the health and safety of consumers at risk. The FDA, a Federal agency, has not enforced the CBD status since 2018.   Legality: Some manufacturers ship CBD products nationally, which is illegal. Often such products are sold online and are therefore available throughout the country. CBD is openly sold in head shops and health food stores in some states where such sales have not been explicitly legalized. Selling unapproved products with unsubstantiated therapeutic claims is not only a violation of the law, but also can put patients at risk, as these products have not been proven to be safe or effective. Federal illegality makes it difficult to conduct research on CBD.  Reference: "FDA Regulation of Cannabis and Cannabis-Derived Products, Including Cannabidiol  (CBD)" - [employer]  Warning: CBD is not FDA approved and has not undergo the same manufacturing controls as prescription drugs.  This means that the purity and safety of available CBD may be questionable. Most of the time, despite manufacturer's claims, it is contaminated with THC (delta-9-tetrahydrocannabinol - the chemical in marijuana responsible for the "HIGH").  When this is the case, the [facility] contaminant will trigger a positive urine drug screen (UDS) test for Marijuana (carboxy-THC). Because a positive UDS for any illicit substance is a violation of our medication agreement, your opioid analgesics (pain medicine) may be permanently discontinued.  MORE ABOUT CBD  General Information: CBD  is a derivative of the Marijuana (cannabis sativa) plant discovered in 15. It is one of the 113 identified substances found in Marijuana. It accounts for up to 40% of the plant's extract. As of 2018, preliminary clinical studies on CBD included research for the treatment of anxiety, movement disorders, and pain. CBD is available and consumed in multiple forms, including inhalation of smoke or vapor, as an aerosol spray, and by mouth. It may be supplied as an oil containing CBD, capsules, dried cannabis, or as a liquid solution. CBD is thought not to be as psychoactive as THC (delta-9-tetrahydrocannabinol - the chemical in marijuana responsible for the "HIGH"). Studies suggest that CBD may interact with different biological target receptors in the body, including cannabinoid and other neurotransmitter receptors. As of 2018 the mechanism of action for its biological effects has not been determined.  Side-effects  Adverse reactions: Dry mouth, diarrhea, decreased appetite, fatigue, drowsiness, malaise, weakness, sleep disturbances, and others.  Drug interactions: CBC may interact with other  medications such as blood-thinners. (Last update: 03/26/2020) ____________________________________________________________________________________________   ____________________________________________________________________________________________  General Risks and Possible Complications  Patient Responsibilities: It is important that you read this as it is part of your  informed consent. It is our duty to inform you of the risks and possible complications associated with treatments offered to you. It is your responsibility as a patient to read this and to ask questions about anything that is not clear or that you believe was not covered in this document.  Patient's Rights: You have the right to refuse treatment. You also have the right to change your mind, even after initially having agreed to have the treatment done. However, under this last option, if you wait until the last second to change your mind, you may be charged for the materials used up to that point.  Introduction: Medicine is not an Chief Strategy Officer. Everything in Medicine, including the lack of treatment(s), carries the potential for danger, harm, or loss (which is by definition: Risk). In Medicine, a complication is a secondary problem, condition, or disease that can aggravate an already existing one. All treatments carry the risk of possible complications. The fact that a side effects or complications occurs, does not imply that the treatment was conducted incorrectly. It must be clearly understood that these can happen even when everything is done following the highest safety standards.  No treatment: You can choose not to proceed with the proposed treatment alternative. The "PRO(s)" would include: avoiding the risk of complications associated with the therapy. The "CON(s)" would include: not getting any of the treatment benefits. These benefits fall under one of three categories: diagnostic; therapeutic; and/or palliative.  Diagnostic benefits include: getting information which can ultimately lead to improvement of the disease or symptom(s). Therapeutic benefits are those associated with the successful treatment of the disease. Finally, palliative benefits are those related to the decrease of the primary symptoms, without necessarily curing the condition (example: decreasing the pain from a flare-up of a chronic condition, such as incurable terminal cancer).  General Risks and Complications: These are associated to most interventional treatments. They can occur alone, or in combination. They fall under one of the following six (6) categories: no benefit or worsening of symptoms; bleeding; infection; nerve damage; allergic reactions; and/or death. 1. No benefits or worsening of symptoms: In Medicine there are no guarantees, only probabilities. No healthcare provider can ever guarantee that a medical treatment will work, they can only state the probability that it may. Furthermore, there is always the possibility that the condition may worsen, either directly, or indirectly, as a consequence of the treatment. 2. Bleeding: This is more common if the patient is taking a blood thinner, either prescription or over the counter (example: Goody Powders, Fish oil, Aspirin, Garlic, etc.), or if suffering a condition associated with impaired coagulation (example: Hemophilia, cirrhosis of the liver, low platelet counts, etc.). However, even if you do not have one on these, it can still happen. If you have any of these conditions, or take one of these drugs, make sure to notify your treating physician. 3. Infection: This is more common in patients with a compromised immune system, either due to disease (example: diabetes, cancer, human immunodeficiency virus [HIV], etc.), or due to medications or treatments (example: therapies used to treat cancer and rheumatological diseases). However, even if you do not have one on these, it can still happen.  If you have any of these conditions, or take one of these drugs, make sure to notify your treating physician. 4. Nerve Damage: This is more common when the treatment is an invasive one, but it can also happen with the use of medications, such as those used in the treatment  of cancer. The damage can occur to small secondary nerves, or to large primary ones, such as those in the spinal cord and brain. This damage may be temporary or permanent and it may lead to impairments that can range from temporary numbness to permanent paralysis and/or brain death. 5. Allergic Reactions: Any time a substance or material comes in contact with our body, there is the possibility of an allergic reaction. These can range from a mild skin rash (contact dermatitis) to a severe systemic reaction (anaphylactic reaction), which can result in death. 6. Death: In general, any medical intervention can result in death, most of the time due to an unforeseen complication. ____________________________________________________________________________________________

## 2020-09-07 NOTE — Progress Notes (Signed)
Patient needs permission to stop PLAVIX for 7 days prior to  procedure ( trans foraminal ESI  vs. Interlaminar @ L2-3 ) in the pain clinic  per Dr. Dossie Arbour . Please advise.   Thank you- Anderson Malta RN

## 2020-09-08 DIAGNOSIS — S32010S Wedge compression fracture of first lumbar vertebra, sequela: Secondary | ICD-10-CM | POA: Insufficient documentation

## 2020-09-08 DIAGNOSIS — G8929 Other chronic pain: Secondary | ICD-10-CM | POA: Diagnosis not present

## 2020-09-08 DIAGNOSIS — M47816 Spondylosis without myelopathy or radiculopathy, lumbar region: Secondary | ICD-10-CM | POA: Insufficient documentation

## 2020-09-08 DIAGNOSIS — M4310 Spondylolisthesis, site unspecified: Secondary | ICD-10-CM | POA: Insufficient documentation

## 2020-09-08 DIAGNOSIS — M545 Low back pain, unspecified: Secondary | ICD-10-CM | POA: Diagnosis not present

## 2020-09-08 DIAGNOSIS — R42 Dizziness and giddiness: Secondary | ICD-10-CM | POA: Diagnosis not present

## 2020-09-08 DIAGNOSIS — M8008XA Age-related osteoporosis with current pathological fracture, vertebra(e), initial encounter for fracture: Secondary | ICD-10-CM | POA: Insufficient documentation

## 2020-09-08 DIAGNOSIS — M5137 Other intervertebral disc degeneration, lumbosacral region: Secondary | ICD-10-CM | POA: Insufficient documentation

## 2020-09-08 DIAGNOSIS — I10 Essential (primary) hypertension: Secondary | ICD-10-CM | POA: Diagnosis not present

## 2020-09-08 DIAGNOSIS — M48061 Spinal stenosis, lumbar region without neurogenic claudication: Secondary | ICD-10-CM | POA: Insufficient documentation

## 2020-09-08 DIAGNOSIS — S32050A Wedge compression fracture of fifth lumbar vertebra, initial encounter for closed fracture: Secondary | ICD-10-CM | POA: Insufficient documentation

## 2020-09-08 DIAGNOSIS — I69398 Other sequelae of cerebral infarction: Secondary | ICD-10-CM | POA: Diagnosis not present

## 2020-09-08 DIAGNOSIS — M47817 Spondylosis without myelopathy or radiculopathy, lumbosacral region: Secondary | ICD-10-CM | POA: Insufficient documentation

## 2020-09-08 DIAGNOSIS — M5136 Other intervertebral disc degeneration, lumbar region: Secondary | ICD-10-CM | POA: Insufficient documentation

## 2020-09-08 DIAGNOSIS — M2428 Disorder of ligament, vertebrae: Secondary | ICD-10-CM | POA: Insufficient documentation

## 2020-09-08 DIAGNOSIS — M4316 Spondylolisthesis, lumbar region: Secondary | ICD-10-CM | POA: Insufficient documentation

## 2020-09-08 DIAGNOSIS — M5126 Other intervertebral disc displacement, lumbar region: Secondary | ICD-10-CM | POA: Insufficient documentation

## 2020-09-08 DIAGNOSIS — J449 Chronic obstructive pulmonary disease, unspecified: Secondary | ICD-10-CM | POA: Diagnosis not present

## 2020-09-08 DIAGNOSIS — E538 Deficiency of other specified B group vitamins: Secondary | ICD-10-CM | POA: Insufficient documentation

## 2020-09-08 MED ORDER — VITAMIN B-12 5000 MCG SL SUBL: 5000.0000 ug | SUBLINGUAL_TABLET | Freq: Every day | SUBLINGUAL | 2 refills | Status: DC

## 2020-09-11 LAB — CUP PACEART REMOTE DEVICE CHECK
Date Time Interrogation Session: 20220121230426
Implantable Pulse Generator Implant Date: 20190516

## 2020-09-12 ENCOUNTER — Other Ambulatory Visit: Payer: Self-pay | Admitting: Pain Medicine

## 2020-09-12 ENCOUNTER — Ambulatory Visit (INDEPENDENT_AMBULATORY_CARE_PROVIDER_SITE_OTHER): Payer: Medicare Other

## 2020-09-12 DIAGNOSIS — M8008XA Age-related osteoporosis with current pathological fracture, vertebra(e), initial encounter for fracture: Secondary | ICD-10-CM

## 2020-09-12 DIAGNOSIS — I6310 Cerebral infarction due to embolism of unspecified precerebral artery: Secondary | ICD-10-CM | POA: Diagnosis not present

## 2020-09-12 DIAGNOSIS — E559 Vitamin D deficiency, unspecified: Secondary | ICD-10-CM

## 2020-09-12 DIAGNOSIS — S32010S Wedge compression fracture of first lumbar vertebra, sequela: Secondary | ICD-10-CM

## 2020-09-12 DIAGNOSIS — S32050D Wedge compression fracture of fifth lumbar vertebra, subsequent encounter for fracture with routine healing: Secondary | ICD-10-CM

## 2020-09-13 ENCOUNTER — Ambulatory Visit (INDEPENDENT_AMBULATORY_CARE_PROVIDER_SITE_OTHER): Payer: Medicare Other | Admitting: Internal Medicine

## 2020-09-13 ENCOUNTER — Encounter: Payer: Self-pay | Admitting: Internal Medicine

## 2020-09-13 ENCOUNTER — Other Ambulatory Visit: Payer: Self-pay

## 2020-09-13 VITALS — BP 160/62 | HR 70 | Ht 66.0 in | Wt 212.5 lb

## 2020-09-13 DIAGNOSIS — I639 Cerebral infarction, unspecified: Secondary | ICD-10-CM

## 2020-09-13 DIAGNOSIS — Z959 Presence of cardiac and vascular implant and graft, unspecified: Secondary | ICD-10-CM | POA: Diagnosis not present

## 2020-09-13 DIAGNOSIS — Z79899 Other long term (current) drug therapy: Secondary | ICD-10-CM | POA: Diagnosis not present

## 2020-09-13 MED ORDER — APIXABAN 5 MG PO TABS: 5.0000 mg | ORAL_TABLET | Freq: Two times a day (BID) | ORAL | 11 refills | Status: DC

## 2020-09-13 NOTE — Patient Instructions (Addendum)
Medication Instructions:  - Your physician has recommended you make the following change in your medication:   1) STOP aspirin  2) STOP plavix (clopidogrel)  3) START eliquis 5 mg- take 1 tablet by mouth twice daily   Samples Given:  Eliquis 5 mg Lot: [id number] Exp: 09/2022  # 2 boxes  *If you need a refill on your cardiac medications before your next appointment, please call your pharmacy*   Lab Work: - Your physician recommends that you return for lab work in: 4 weeks (around 10/04/20) : BMP/ CBC  Medical Mall Entrance at [facility] 1st desk on the right to check in, past the screening table Lab hours: Monday- Friday (7:30 am- 5:30 pm)   If you have labs (blood work) drawn today and your tests are completely normal, you will receive your results only by: Marland Kitchen MyChart Message (if you have MyChart) OR . A paper copy in the mail If you have any lab test that is abnormal or we need to change your treatment, we will call you to review the results.   Testing/Procedures: - none ordered   Follow-Up: At [facility], you and your health needs are our priority.  As part of our continuing mission to provide you with exceptional heart care, we have created designated Provider Care Teams.  These Care Teams include your primary Cardiologist (physician) and Advanced Practice Providers (APPs -  Physician Assistants and Nurse Practitioners) who all work together to provide you with the care you need, when you need it.  We recommend signing up for the patient portal called "MyChart".  Sign up information is provided on this After Visit Summary.  MyChart is used to connect with patients for Virtual Visits (Telemedicine).  Patients are able to view lab/test results, encounter notes, upcoming appointments, etc.  Non-urgent messages can be sent to your provider as well.   To learn more about what you can do with MyChart, go to [email or web address].    Your next appointment:   18 month(s)  The  format for your next appointment:   In Person  Provider:   Virl Axe, MD   Other Instructions  Eliquis (Apixaban) Tablets What is this medicine? APIXABAN (a PIX a ban) is an anticoagulant (blood thinner). It is used to lower the chance of stroke in people with a medical condition called atrial fibrillation. It is also used to treat or prevent blood clots in the lungs or in the veins. This medicine may be used for other purposes; ask your health care provider or pharmacist if you have questions. COMMON BRAND NAME(S): Eliquis What should I tell my health care provider before I take this medicine? They need to know if you have any of these conditions:  antiphospholipid antibody syndrome  bleeding disorders  bleeding in the brain  blood in your stools (black or tarry stools) or if you have blood in your vomit  history of blood clots  history of stomach bleeding  kidney disease  liver disease  mechanical heart valve  an unusual or allergic reaction to apixaban, other medicines, foods, dyes, or preservatives  pregnant or trying to get pregnant  breast-feeding How should I use this medicine? Take this medicine by mouth with a glass of water. Follow the directions on the prescription label. You can take it with or without food. If it upsets your stomach, take it with food. Take your medicine at regular intervals. Do not take it more often than directed. Do not stop taking except on your  doctor's advice. Stopping this medicine may increase your risk of a blood clot. Be sure to refill your prescription before you run out of medicine. Talk to your pediatrician regarding the use of this medicine in children. Special care may be needed. Overdosage: If you think you have taken too much of this medicine contact a poison control center or emergency room at once. NOTE: This medicine is only for you. Do not share this medicine with others. What if I miss a dose? If you miss a dose,  take it as soon as you can. If it is almost time for your next dose, take only that dose. Do not take double or extra doses. What may interact with this medicine? This medicine may interact with the following:  aspirin and aspirin-like medicines  certain medicines for fungal infections like ketoconazole and itraconazole  certain medicines for seizures like carbamazepine and phenytoin  certain medicines that treat or prevent blood clots like warfarin, enoxaparin, and dalteparin  clarithromycin  NSAIDs, medicines for pain and inflammation, like ibuprofen or naproxen  rifampin  ritonavir  St. John's wort This list may not describe all possible interactions. Give your health care provider a list of all the medicines, herbs, non-prescription drugs, or dietary supplements you use. Also tell them if you smoke, drink alcohol, or use illegal drugs. Some items may interact with your medicine. What should I watch for while using this medicine? Visit your healthcare professional for regular checks on your progress. You may need blood work done while you are taking this medicine. Your condition will be monitored carefully while you are receiving this medicine. It is important not to miss any appointments. Avoid sports and activities that might cause injury while you are using this medicine. Severe falls or injuries can cause unseen bleeding. Be careful when using sharp tools or knives. Consider using an Copy. Take special care brushing or flossing your teeth. Report any injuries, bruising, or red spots on the skin to your healthcare professional. If you are going to need surgery or other procedure, tell your healthcare professional that you are taking this medicine. Wear a medical ID bracelet or chain. Carry a card that describes your disease and details of your medicine and dosage times. What side effects may I notice from receiving this medicine? Side effects that you should report to your  doctor or health care professional as soon as possible:  allergic reactions like skin rash, itching or hives, swelling of the face, lips, or tongue  signs and symptoms of bleeding such as bloody or black, tarry stools; red or dark-brown urine; spitting up blood or brown material that looks like coffee grounds; red spots on the skin; unusual bruising or bleeding from the eye, gums, or nose  signs and symptoms of a blood clot such as chest pain; shortness of breath; pain, swelling, or warmth in the leg  signs and symptoms of a stroke such as changes in vision; confusion; trouble speaking or understanding; severe headaches; sudden numbness or weakness of the face, arm or leg; trouble walking; dizziness; loss of coordination This list may not describe all possible side effects. Call your doctor for medical advice about side effects. You may report side effects to FDA at 1-800-FDA-1088. Where should I keep my medicine? Keep out of the reach of children. Store at room temperature between 20 and 25 degrees C (68 and 77 degrees F). Throw away any unused medicine after the expiration date. NOTE: This sheet is a summary. It  may not cover all possible information. If you have questions about this medicine, talk to your doctor, pharmacist, or health care provider.  2021 Elsevier/Gold Standard (2020-06-15 16:54:11)

## 2020-09-13 NOTE — Progress Notes (Signed)
Patient Care Team: Lavera Guise, MD as PCP - General (Internal Medicine)   HPI  [name] Douglas is a 80 y.o. female Seen in followup for a loop recorder implanted for Cryptogenic Stroke 5/19  Mild DOE no chest pain or edema but effort is much more limited by back pain than anything else.    No bleeding hx   Currently taking ASA and plavix  DATE TEST EF   5/19 Echo   55-60 %   11/21 Echo   55-60 % LA normal         Date Cr K Hgb  12/21 0.85 3.8 10.6<,11.7           On an interrogation 3/21 >> afib and mssg was sent to discuss anticoagulation but apparently never happened and then neuro ( thankfully) saw that note and reached out for [place] to followup   Thromboembolic risk factors ( age  -2, HTN-1, TIA/CVA-2, Gender-1) for a CHADSVASc Score of >=6   Records and Results Reviewed   Past Medical History:  Diagnosis Date  . Anxiety   . Arthritis    "shoulders" (08/20/2014)  . Asthma   . Chronic lower back pain   . Complication of anesthesia    "agitated & restless after knee replacement in 2008"  . COPD (chronic obstructive pulmonary disease) ([place])   . DDD (degenerative disc disease), lumbar   . Depression   . Diverticulosis   . GERD (gastroesophageal reflux disease)   . High cholesterol   . Hypertension   . Hypothyroidism   . Memory loss    mild  . On home oxygen therapy    "2L at night" (08/20/2014)  . Peripheral vascular disease ([place])   . Pre-diabetes   . Shortness of breath dyspnea    with exertion  . Stroke ([place])    mild, no deficits    Past Surgical History:  Procedure Laterality Date  . CARDIAC CATHETERIZATION  ~ 2007  . EXCISIONAL HEMORRHOIDECTOMY  1960's  . EYE SURGERY Bilateral    Cataract extraction with IOL  . KNEE ARTHROSCOPY Left X 2 <2008  . KNEE ARTHROSCOPY WITH SUBCHONDROPLASTY Right 04/19/2016   Procedure: KNEE ARTHROSCOPY WITH SUBCHONDROPLASTY, PARTIAL MENISCECTOMY;  Surgeon: Hessie Knows, MD;  Location: [facility];  Service:  Orthopedics;  Laterality: Right;  . LOOP RECORDER INSERTION N/A 01/02/2018   Procedure: LOOP RECORDER INSERTION;  Surgeon: Deboraha Sprang, MD;  Location: [facility];  Service: Cardiovascular;  Laterality: N/A;  . SHOULDER OPEN ROTATOR CUFF REPAIR Right 2001  . TONSILLECTOMY  ~ 1950  . TOTAL KNEE ARTHROPLASTY Left 2008  . TOTAL KNEE ARTHROPLASTY Right 07/03/2016   Procedure: TOTAL KNEE ARTHROPLASTY;  Surgeon: Hessie Knows, MD;  Location: [facility];  Service: Orthopedics;  Laterality: Right;  . TUBAL LIGATION  1973  . VAGINAL HYSTERECTOMY  1974    Current Meds  Medication Sig  . albuterol (VENTOLIN HFA) 108 (90 Base) MCG/ACT inhaler Inhale 2 puffs into the lungs every 6 (six) hours as needed for wheezing or shortness of breath.  Marland Kitchen aspirin EC 81 MG tablet Take 81 mg by mouth daily. Swallow whole.  . Aspirin-Acetaminophen-Caffeine (GOODY HEADACHE PO) Take 1 packet by mouth daily as needed (headaches/pain).  Marland Kitchen atorvastatin (LIPITOR) 80 MG tablet Take 1 tablet (80 mg total) by mouth daily at 6 PM.  . clopidogrel (PLAVIX) 75 MG tablet Take 1 tablet (75 mg total) by mouth daily.  . Cyanocobalamin (VITAMIN B-12) 5000  MCG SUBL Place 1 tablet (5,000 mcg total) under the tongue daily.  . cyclobenzaprine (FLEXERIL) 5 MG tablet Take 1 tablet (5 mg total) by mouth at bedtime.  . docusate sodium (COLACE) 100 MG capsule Take 100 mg by mouth daily as needed for mild constipation.   Marland Kitchen donepezil (ARICEPT) 5 MG tablet Take 1 tablet (5 mg total) by mouth at bedtime.  . ergocalciferol (DRISDOL) 1.25 MG (50000 UT) capsule Take 1 capsule (50,000 Units total) by mouth every Friday.  . gabapentin (NEURONTIN) 100 MG capsule Take 100 mg by mouth 3 (three) times daily.  Marland Kitchen HYDROcodone-acetaminophen (NORCO/VICODIN) 5-325 MG tablet Take 1-2 tablets by mouth 2 (two) times daily as needed for severe pain.  Marland Kitchen levothyroxine (SYNTHROID) 75 MCG tablet Take 1 tablet (75 mcg total) by mouth daily before breakfast.  .  losartan (COZAAR) 25 MG tablet Take 1 tablet (25 mg total) by mouth daily.  . metoprolol succinate (TOPROL-XL) 50 MG 24 hr tablet Take 2 tablets (100 mg total) by mouth daily.  Marland Kitchen venlafaxine XR (EFFEXOR-XR) 75 MG 24 hr capsule TAKE 2 CAPSULES BY MOUTH DAILY. (Patient taking differently: Take 150 mg by mouth daily with breakfast.)    Allergies  Allergen Reactions  . Heparin Nausea And Vomiting      Review of Systems negative except from HPI and PMH  Physical Exam BP (!) 160/62 (BP Location: Left Arm, Patient Position: Sitting, Cuff Size: Normal)   Pulse 70   Ht 5\' 6"  (1.676 m)   Wt 212 lb 8 oz (96.4 kg)   SpO2 96%   BMI 34.30 kg/m  Well developed and well nourished in no acute distress HENT normal E scleral and icterus clear Neck Supple JVP flat; carotids brisk and full Clear to ausculation  Regular rate and rhythm, no murmurs gallops or rub Soft with active bowel sounds No clubbing cyanosis   Edema Alert and oriented, grossly normal motor and sensory function Skin Warm and Dry  ECG sinus @ 70 15/08/42  CrCl cannot be calculated (Patient's most recent lab result is older than the maximum 21 days allowed.).   Assessment and  Plan  Atrial fib paroxysmal  LINQ implantable loop  Hypertension  Stroke prior  HFpEF  Anemia   Discussed the presence of AFib on the monitor detected 3/21 but then appt never got made, and the hypothesis that anticoagulation is appropriate for Cryptogenic Stroke assoc detected AFib,. This true notwithstanding the  LOOP trial. Reviewed risks esp vis a vis ASA/Plavix and Averroes and benefits As above  ]BP poorly controlled  Have encouraged home monitoring  Euvolemic continue current meds-- encouraged less salt  Will recheck Hgb in about 4 weeks-- interval loss      Current medicines are reviewed at length with the patient today .  The patient does not  have concerns regarding medicines.

## 2020-09-14 LAB — 25-HYDROXY VITAMIN D LCMS D2+D3
25-Hydroxy, Vitamin D-2: 57 ng/mL
25-Hydroxy, Vitamin D-3: 1.9 ng/mL
25-Hydroxy, Vitamin D: 59 ng/mL

## 2020-09-15 DIAGNOSIS — R42 Dizziness and giddiness: Secondary | ICD-10-CM | POA: Diagnosis not present

## 2020-09-15 DIAGNOSIS — I69398 Other sequelae of cerebral infarction: Secondary | ICD-10-CM | POA: Diagnosis not present

## 2020-09-15 DIAGNOSIS — M545 Low back pain, unspecified: Secondary | ICD-10-CM | POA: Diagnosis not present

## 2020-09-15 DIAGNOSIS — I10 Essential (primary) hypertension: Secondary | ICD-10-CM | POA: Diagnosis not present

## 2020-09-15 DIAGNOSIS — J449 Chronic obstructive pulmonary disease, unspecified: Secondary | ICD-10-CM | POA: Diagnosis not present

## 2020-09-15 DIAGNOSIS — G8929 Other chronic pain: Secondary | ICD-10-CM | POA: Diagnosis not present

## 2020-09-19 ENCOUNTER — Ambulatory Visit: Payer: Medicare Other | Attending: Pain Medicine | Admitting: Pain Medicine

## 2020-09-19 ENCOUNTER — Telehealth: Payer: Self-pay

## 2020-09-19 ENCOUNTER — Encounter: Payer: Self-pay | Admitting: Pain Medicine

## 2020-09-19 ENCOUNTER — Other Ambulatory Visit: Payer: Self-pay

## 2020-09-19 VITALS — BP 149/59 | HR 72 | Temp 97.3°F | Resp 16 | Ht 66.0 in | Wt 211.0 lb

## 2020-09-19 DIAGNOSIS — G894 Chronic pain syndrome: Secondary | ICD-10-CM

## 2020-09-19 DIAGNOSIS — M4310 Spondylolisthesis, site unspecified: Secondary | ICD-10-CM | POA: Diagnosis not present

## 2020-09-19 DIAGNOSIS — Z79899 Other long term (current) drug therapy: Secondary | ICD-10-CM | POA: Diagnosis not present

## 2020-09-19 DIAGNOSIS — S32050S Wedge compression fracture of fifth lumbar vertebra, sequela: Secondary | ICD-10-CM | POA: Diagnosis not present

## 2020-09-19 DIAGNOSIS — M5441 Lumbago with sciatica, right side: Secondary | ICD-10-CM | POA: Diagnosis not present

## 2020-09-19 DIAGNOSIS — Z7901 Long term (current) use of anticoagulants: Secondary | ICD-10-CM

## 2020-09-19 DIAGNOSIS — S32010S Wedge compression fracture of first lumbar vertebra, sequela: Secondary | ICD-10-CM

## 2020-09-19 DIAGNOSIS — M48062 Spinal stenosis, lumbar region with neurogenic claudication: Secondary | ICD-10-CM

## 2020-09-19 DIAGNOSIS — M48061 Spinal stenosis, lumbar region without neurogenic claudication: Secondary | ICD-10-CM

## 2020-09-19 DIAGNOSIS — M5416 Radiculopathy, lumbar region: Secondary | ICD-10-CM

## 2020-09-19 DIAGNOSIS — M5417 Radiculopathy, lumbosacral region: Secondary | ICD-10-CM | POA: Diagnosis not present

## 2020-09-19 DIAGNOSIS — G8929 Other chronic pain: Secondary | ICD-10-CM | POA: Diagnosis not present

## 2020-09-19 DIAGNOSIS — M79604 Pain in right leg: Secondary | ICD-10-CM | POA: Insufficient documentation

## 2020-09-19 DIAGNOSIS — M47816 Spondylosis without myelopathy or radiculopathy, lumbar region: Secondary | ICD-10-CM | POA: Diagnosis not present

## 2020-09-19 DIAGNOSIS — I639 Cerebral infarction, unspecified: Secondary | ICD-10-CM

## 2020-09-19 DIAGNOSIS — M4316 Spondylolisthesis, lumbar region: Secondary | ICD-10-CM

## 2020-09-19 NOTE — Progress Notes (Signed)
PROVIDER NOTE: Information contained herein reflects review and annotations entered in association with encounter. Interpretation of such information and data should be left to medically-trained personnel. Information provided to patient can be located elsewhere in the medical record under "Patient Instructions". Document created using STT-dictation technology, any transcriptional errors that may result from process are unintentional.    Patient: [name] Douglas  Service Category: E/M  Provider: Gaspar Cola, MD  DOB: [birth year]  DOS: 09/19/2020  Specialty: Interventional Pain Management  MRN: [id number]  Setting: Ambulatory outpatient  PCP: Lavera Guise, MD  Type: Established Patient    Referring Provider: Lavera Guise, MD  Location: Office  Delivery: Face-to-face     Primary Reason(s) for Visit: Encounter for evaluation before starting new chronic pain management plan of care (Level of risk: moderate) CC: Back Pain  HPI  [name] Douglas is a 80 y.o. year old, female patient, who comes today for a follow-up evaluation to review the test results and decide on a treatment plan. She has Diarrhea; Essential hypertension; Hyperlipidemia; Hypothyroidism; Tobacco abuse; COPD (chronic obstructive pulmonary disease) ([place]); Enteritis due to Clostridium difficile; Colitis due to Clostridium difficile; Recurrent Clostridium difficile diarrhea; Ileus ([place]); Primary localized osteoarthritis of right knee; L brain TIA (transient ischemic attack); Solitary pulmonary nodule; Occlusion and stenosis of bilateral carotid arteries; Encounter for general adult medical examination with abnormal findings; Nausea and vomiting; Dysuria; Need for vaccination against Streptococcus pneumoniae using pneumococcal conjugate vaccine 7; Iron deficiency anemia; Vitamin D deficiency; Flu vaccine need; Intractable episodic cluster headache; Other symptoms and signs involving the nervous system; History of CVA (cerebrovascular accident);  Acute diarrhea; Subarachnoid hemorrhage ([place]); Atopic dermatitis; Dizziness; Muscle spasm; Urinary tract infection with hematuria; Headache disorder; CVA (cerebral vascular accident) ([place]); Blurred vision; Intractable low back pain; Fall down stairs; Hemorrhagic stroke ([place]); Lumbar central spinal stenosis (Moderate: L2-3  Severe: L3-4, L4-5) w/ neurogenic claudication; History of stroke; Chronic pain syndrome; Pharmacologic therapy; Disorder of skeletal system; Problems influencing health status; Uncomplicated opioid dependence ([place]); Chronic low back pain (2ry area of Pain) (Bilateral) (R>L) w/ sciatica (Right); Chronic lower extremity pain (1ry area of Pain) (Right); Chronic lumbar radiculopathy (Right); Lumbosacral radiculopathy at L2 (Right); Lumbar radiculitis (L2 dermatome) (Right); Unspecified inflammatory spondylopathy, lumbar region [facility]); Chronic anticoagulation (Eliquis); Abnormal MRI, lumbar spine (04/20/2020); Lumbar foraminal stenosis (Bilateral: L2-3, L3-4, L4-5) (Right: L5-S1) (Severe left L4-5); Grade 1 Lumbar Anterolisthesis (L3/4 and L4/5); Grade 1 Lumbar Retrolisthesis (L2/3); Protrusion of lumbar intervertebral disc (Multilevel); Compression fracture of L1 lumbar vertebra, sequela; Other intervertebral disc degeneration, lumbar region; Lumbosacral facet syndrome (Multilevel) (Bilateral); Lumbar facet hypertrophy (Multilevel) (Bilateral); Ligamentum flavum hypertrophy; Lumbar lateral recess stenosis (Right: L2-3); DDD (degenerative disc disease), lumbosacral; Compression fracture of L5 vertebra ([place]); Osteoporosis with pathological fracture of lumbar vertebra ([place]); and Vitamin B12 deficiency on their problem list. Her primarily concern today is the Back Pain  Pain Assessment: Location: Left,Right Back Radiating: hips bilateral around to front of thighs to knees Onset: More than a month ago Duration: Chronic pain Quality: Aching,Burning,Constant,Stabbing,Throbbing,Shooting Severity: 7  /10 (subjective, self-reported pain score)  Effect on ADL: "I can do anything". bending, walking, standing Timing: Constant Modifying factors: rest BP: (!) 149/59  HR: 72  [name] Douglas comes in today for a follow-up visit after her initial evaluation on 09/07/2020. Today we went over the results of her tests. These were explained in "L[place]man's terms". During today's appointment we went over my diagnostic impression, as well as the proposed treatment plan.  According to the  patient she was sent over to [place] by Dr. Deetta Perla for management of her low back and lower extremity pain.  She was actually scheduled for surgery and just prior to the surgery around Thanksgiving, she had a stroke and the surgery was canceled.  She comes into the clinic today on a wheelchair.  According to the patient the primary area of pain is that of the right lower extremity where the pain goes over the anterior thigh to the level of the knee (L2 dermatome).  She describes this pain as a severe burning and stabbing sensation.  This pain appears to follow an L2 dermatomal pattern.  She also indicates that she does not have this pain all the time and in fact she recently finished a second round of prednisone provided to her by Dr. Lacinda Axon, which completely eliminated all of her pain.  She describes that this pain typically will come about whenever she stands for prolonged.  Time.  She refers that it has happened twice while she was shopping at [employer].  When this pain comes on, she has to sit down and within a couple of minutes it goes away.  However once it has occurred, she has to go home because it will immediately come back after she stands for any prolonged period of time.  The patient's secondary area pain is that of the lower back (Bilateral) (R>L).  She denies any prior back surgeries but does admit to having had x-rays, MRIs, and physical therapy which actually worsened the pain.  The patient indicates having had II nerve blocks  done at the neuro pain clinic by Dr. Alba Destine, both of which have failed to provide her with any long-term benefit.  According to the patient's daughter, who has been keeping a diary of the patient's problems, the first injection provided the patient with approximately 7 days of pain relief and the second injection only provided her with 24 to 48 hours of pain relief after that the pain has come back.  Once the second injection failed to benefit, she was referred by Dr. Alba Destine to Dr. Lacinda Axon for surgery.  Pharmacotherapy: The patient is currently taking hydrocodone/APAP 5/325 1 tablet p.o. twice daily; gabapentin (Neurontin) 100 mg capsule, 1 cap p.o. 3 times daily; cyclobenzaprine (Flexeril) 5 mg tablet, 1 tab p.o. daily at bedtime.  Of significance is the fact that the patient is currently on Plavix 75 mg p.o. daily.  Lumbar MRI: Done on 04/20/2020.  Ordered by Lonell Face, NP ([place]) (04/20/2020) LUMBAR MRI FINDINGS: Alignment: Slight anterolisthesis is present at L3-4 and L4-5. Minimal retrolisthesis present at L2-3. Vertebrae: A remote superior endplate fractures present at L1. Chronic fatty endplate marrow changes are present at L4-5 bilaterally.  Disc levels: T12-L1: A mild broad-based disc protrusion is present. Facet hypertrophy is noted bilaterally. L1-2: A mild broad-based disc protrusion is present. Facet hypertrophy is worse on the right. L2-3: Extruded disc material extends 11 mm superiorly in the right lateral recess. Broad-based disc protrusion is present. Moderate facet hypertrophy and ligamentum flavum thickening is noted. This results in moderate central canal stenosis with crowding of the nerve roots. Moderate foraminal narrowing is worse on the right. L3-4: A broad-based disc protrusion is present. Moderate facet hypertrophy and ligamentum flavum thickening contributes to severe central canal stenosis with crowding of nerve roots above this level.  Moderate foraminal narrowing is present bilaterally. L4-5: A broad-based disc protrusion is present. Moderate facet hypertrophy is worse on left. This results  in severe central canal stenosis. Severe left and moderate right foraminal narrowing is present. L5-S1: A mild broad-based disc bulge is present. Moderate facet hypertrophy present on the right. This leads to mild right foraminal stenosis.  IMPRESSION: 1. Moderate central and bilateral foraminal stenosis at L2-3 is worse on the right. A broad-based disc protrusion present with the superior disc extrusion extending 11 mm above the inferior endplate of L2 in the right lateral recess.  2. Severe central canal stenosis at L3-4 and L4-5 with crowding of the nerve roots above both levels. 3. Moderate foraminal narrowing bilaterally at L3-4. 4. Severe left and moderate right foraminal stenosis at L4-5. 5. Mild right foraminal narrowing at L5-S1. 6. Remote superior endplate compression fracture at L1  Interventional therapies by Girtha Hake, MD [facility] PMR): 1. Right L5-S1 TFESI #1 (05/05/2020) - 7 days of pain relief. 2. Bilateral L5-S1 TFESI #2 (06/09/2020) - 24 to 48 hours of pain relief.  Assessment of interventional therapies: I am not quite sure why Dr. Alba Destine chose to do the above interventional therapies at those particular levels.  The patient describes symptoms primarily affecting the right L2 nerve root.  A lumbar MRI done on 04/20/2020 and available prior to the procedures, indicating that the patient has central and bilateral foraminal stenosis at L2-3, worse on the right side.  Above it, there is an L1 vertebral body fracture and a disc protrusion as well as facet hypertrophy which is also worse on the right side, likely to be causing some L1-2 lateral recess narrowing, which in combination with the right sided L2-3 disc extrusion with cephalad migration, may also be contributing to the right L2 radicular symptoms.                        Based on the above information, I would recommend starting with a Diagnostic/therapeutic right L1 & L2 transforaminal ESI #1, under fluoroscopic guidance and IV sedation.  Today the patient has expressed her interest in perhaps also taking over her medication management.  However, this was not brought up on the initial evaluation and therefore no testing was done for this purpose.  Specifically no UDS.  Today we will be ordering that and we will evaluate her for this possibility once we get the tests back.  Controlled Substance Pharmacotherapy Assessment REMS (Risk Evaluation and Mitigation Strategy)  Analgesic: Hydrocodone/APAP 5/325 1 tablet p.o. twice daily (10 mg/day of hydrocodone)  MME/day: 10 mg/day  Pill Count: None expected due to no prior prescriptions written by our practice. Ignatius Specking, RN  09/19/2020  8:09 AM  Sign when Signing Visit Safety precautions to be maintained throughout the outpatient stay will include: orient to surroundings, keep bed in low position, maintain call bell within reach at all times, provide assistance with transfer out of bed and ambulation.    Pharmacokinetics: Liberation and absorption (onset of action): WNL Distribution (time to peak effect): WNL Metabolism and excretion (duration of action): WNL         Pharmacodynamics: Desired effects: Analgesia: Ms. Reiland reports >50% benefit. Functional ability: Patient reports that medication allows her to accomplish basic ADLs Clinically meaningful improvement in function (CMIF): Sustained CMIF goals met Perceived effectiveness: Described as relatively effective, allowing for increase in activities of daily living (ADL) Undesirable effects: Side-effects or Adverse reactions: None reported Monitoring: [place] PMP: PDMP reviewed during this encounter. Online review of the past 22-monthperiod previously conducted. Not applicable at this point since we have  not taken over the patient's  medication management yet. List of other Serum/Urine Drug Screening Test(s):  Lab Results  Component Value Date   ETH <10 12/20/2017   List of all UDS test(s) done:  No results found. Last UDS on record: No results found.  Risk Assessment Profile: Aberrant behavior: See initial evaluations. None observed or detected today Comorbid factors increasing risk of overdose: See initial evaluation. No additional risks detected today Opioid risk tool (ORT):  Opioid Risk  09/19/2020  Alcohol -  Illegal Drugs -  Rx Drugs -  Alcohol 0  Illegal Drugs 0  Rx Drugs 0  Age between 16-45 years  -  History of Preadolescent Sexual Abuse -  Psychological Disease 0  Depression 0  Opioid Risk Tool Scoring 0  Opioid Risk Interpretation Low Risk    ORT Scoring interpretation table:  Score <3 = Low Risk for SUD  Score between 4-7 = Moderate Risk for SUD  Score >8 = High Risk for Opioid Abuse   Risk of substance use disorder (SUD): Low  Laboratory Chemistry Profile   Renal Lab Results  Component Value Date   BUN 18 08/04/2020   CREATININE 0.85 08/04/2020   BCR 16 05/07/2019   GFRAA >60 11/06/2019   GFRNONAA >60 08/04/2020   SPECGRAV CANCELED 04/07/2020   PHUR CANCELED 04/07/2020   PROTEINUR CANCELED 04/07/2020     Electrolytes Lab Results  Component Value Date   NA 144 08/04/2020   K 3.8 08/04/2020   CL 107 08/04/2020   CALCIUM 9.4 08/04/2020   MG 2.1 09/07/2020     Hepatic Lab Results  Component Value Date   AST 13 (L) 08/04/2020   ALT 10 08/04/2020   ALBUMIN 3.7 08/04/2020   ALKPHOS 52 08/04/2020   LIPASE 18 08/20/2014     ID Lab Results  Component Value Date   HIV NONREACTIVE 08/24/2014   SARSCOV2NAA NEGATIVE 08/03/2020   STAPHAUREUS NEGATIVE 07/19/2020   MRSAPCR NEGATIVE 07/19/2020   HCVAB NEGATIVE 08/24/2014     Bone Lab Results  Component Value Date   VD25OH 8.6 (L) 05/07/2019   25OHVITD1 59 09/07/2020   25OHVITD2 57 09/07/2020   25OHVITD3 1.9  09/07/2020   TESTOSTERONE 16 04/29/2018     Endocrine Lab Results  Component Value Date   GLUCOSE 110 (H) 08/04/2020   GLUCOSEU CANCELED 04/07/2020   HGBA1C 6.0 (H) 07/15/2020   TSH 0.654 05/07/2019   FREET4 1.10 05/07/2019   TESTOSTERONE 16 04/29/2018     Neuropathy Lab Results  Component Value Date   VITAMINB12 113 (L) 09/07/2020   FOLATE 13.6 02/12/2018   HGBA1C 6.0 (H) 07/15/2020   HIV NONREACTIVE 08/24/2014     CNS No results found for: COLORCSF, APPEARCSF, RBCCOUNTCSF, WBCCSF, POLYSCSF, LYMPHSCSF, EOSCSF, PROTEINCSF, GLUCCSF, JCVIRUS, CSFOLI, IGGCSF, LABACHR, ACETBL, LABACHR, ACETBL   Inflammation (CRP: Acute  ESR: Chronic) Lab Results  Component Value Date   CRP 3 01/04/2020   ESRSEDRATE 17 01/04/2020   LATICACIDVEN 1.7 04/23/2016     Rheumatology No results found for: RF, ANA, LABURIC, URICUR, LYMEIGGIGMAB, LYMEABIGMQN, HLAB27   Coagulation Lab Results  Component Value Date   INR 1.0 07/19/2020   LABPROT 12.8 07/19/2020   APTT 39 (H) 07/19/2020   PLT 247 08/04/2020     Cardiovascular Lab Results  Component Value Date   TROPONINI <0.03 12/20/2017   HGB 10.6 (L) 08/04/2020   HCT 32.3 (L) 08/04/2020     Screening Lab Results  Component Value Date   SARSCOV2NAA NEGATIVE  08/03/2020   STAPHAUREUS NEGATIVE 07/19/2020   MRSAPCR NEGATIVE 07/19/2020   HCVAB NEGATIVE 08/24/2014   HIV NONREACTIVE 08/24/2014     Cancer No results found for: CEA, CA125, LABCA2   Allergens No results found for: ALMOND, APPLE, ASPARAGUS, AVOCADO, BANANA, BARLEY, BASIL, BAYLEAF, GREENBEAN, LIMABEAN, WHITEBEAN, BEEFIGE, REDBEET, BLUEBERRY, BROCCOLI, CABBAGE, MELON, CARROT, CASEIN, CASHEWNUT, CAULIFLOWER, CELERY     Note: Lab results reviewed.  Recent Diagnostic Imaging Review  Cervical Imaging: Cervical CT wo contrast: Results for orders placed during the hospital encounter of 12/07/16 CT Cervical Spine Wo Contrast  Narrative CLINICAL DATA:  Fall with right forehead  hematoma  EXAM: CT HEAD WITHOUT CONTRAST  CT CERVICAL SPINE WITHOUT CONTRAST  TECHNIQUE: Multidetector CT imaging of the head and cervical spine was performed following the standard protocol without intravenous contrast. Multiplanar CT image reconstructions of the cervical spine were also generated.  COMPARISON:  08/28/2016, 08/23/2016, 08/22/2016  FINDINGS: CT HEAD FINDINGS  Brain: No large vessel territorial infarct or mass is seen. Scattered areas of subarachnoid hemorrhage within the bilateral right greater than left frontal lobes no midline shift. The ventricles are nonenlarged. Mild white matter small vessel ischemic changes.  Vascular: No hyperdense vessels. Stable calcifications at the right MCA bifurcation. Carotid artery calcification.  Skull: No definite fracture.  No suspicious bone lesion.  Sinuses/Orbits: Mild mucosal thickening in the ethmoid sinuses. No acute orbital abnormality.  Other: Large right frontal scalp hematoma and laceration.  CT CERVICAL SPINE FINDINGS  Alignment: Minimal 2 mm anterolisthesis of C3 on C4. Straightening of the cervical spine. Facet alignment is within normal limits.  Skull base and vertebrae: Craniovertebral junction appears intact. The vertebral body heights are normal. No fracture is visualized.  Soft tissues and spinal canal: No prevertebral fluid or swelling. No visible canal hematoma.  Disc levels: Moderate to severe degenerative disc changes at C4-C5, C5-C6 and C6-C7. Multilevel bilateral facet arthropathy. Multilevel bilateral foraminal stenosis, new most marked between C4 and C7.  Upper chest: Lung apices demonstrate emphysema. No evidence for a thyroid mass. Carotid artery calcifications.  Other: None  IMPRESSION: 1. Scattered areas of subarachnoid hemorrhage within the bilateral frontal lobes, right greater than left. No midline shift. Normal ventricle size. Large right frontal scalp hematoma  without underlying fracture. 2. Mild periventricular white matter small vessel ischemic change 3. Minimal anterolisthesis of C3 on C4 suspected to be due to degenerative change. No acute fracture or malalignment is seen. 4. Mild emphysema at the lung apices Critical Value/emergent results were called by telephone at the time of interpretation on 12/08/2016 at 12:47 am to Dr. Marjean Donna , who verbally acknowledged these results.   Electronically Signed By: Donavan Foil M.D. On: 12/08/2016 00:47  Thoracic Imaging: Thoracic DG 2-3 views: Results for orders placed during the hospital encounter of 08/03/20 DG Thoracic Spine 2 View  Narrative CLINICAL DATA:  Fall, back pain  EXAM: THORACIC SPINE 2 VIEWS  COMPARISON:  None.  FINDINGS: Two view radiograph thoracic spine demonstrates normal thoracic kyphosis. No acute fracture or listhesis of the thoracic spine. Vertebral body height and intervertebral disc height have been preserved. Mild endplate remodeling is seen within the mid and lower thoracic spine in keeping with changes of mild degenerative disc disease. Incidentally noted is a remote L1 compression deformity with approximately 20% loss of height. The paraspinal soft tissues are unremarkable. Note is made of an implanted loop recorder.  IMPRESSION: No acute fracture or listhesis of the thoracic spine.   Electronically Signed By: Cassandria Anger  Christa See MD On: 08/03/2020 20:22  Lumbosacral Imaging: Lumbar MR wo contrast: Results for orders placed during the hospital encounter of 04/20/20 MR LUMBAR SPINE WO CONTRAST  Narrative CLINICAL DATA:  Low back pain extending into the buttocks bilaterally. Pain extends into the thighs with burning sensation in both thighs.  EXAM: MRI LUMBAR SPINE WITHOUT CONTRAST  TECHNIQUE: Multiplanar, multisequence MR imaging of the lumbar spine was performed. No intravenous contrast was administered.  COMPARISON:  Lumbar spine  radiographs 03/15/2020  FINDINGS: Segmentation: 5 non rib-bearing lumbar type vertebral bodies are present. The lowest fully formed vertebral body is L5.  Alignment: Slight anterolisthesis is present at L3-4 and L4-5. Minimal retrolisthesis present at L2-3.  Vertebrae: A remote superior endplate fractures present at L1. Chronic fatty endplate marrow changes are present at L4-5 bilaterally. Marrow signal and vertebral body heights are otherwise normal.  Conus medullaris and cauda equina: Conus extends to the L1 level. Conus and cauda equina appear normal.  Paraspinal and other soft tissues: Limited imaging the abdomen is unremarkable. There is no significant adenopathy. No solid organ lesions are present.  Disc levels:  T12-L1: A mild broad-based disc protrusion is present. Facet hypertrophy is noted bilaterally. No significant stenosis present.  L1-2: A mild broad-based disc protrusion is present. Facet hypertrophy is worse on the right. No significant stenosis is present.  L2-3: Extruded disc material extends 11 mm superiorly in the right lateral recess. Broad-based disc protrusion is present. Moderate facet hypertrophy and ligamentum flavum thickening is noted. This results in moderate central canal stenosis with crowding of the nerve roots. Moderate foraminal narrowing is worse on the right.  L3-4: A broad-based disc protrusion is present. Moderate facet hypertrophy and ligamentum flavum thickening contributes to severe central canal stenosis with crowding of nerve roots above this level. Moderate foraminal narrowing is present bilaterally.  L4-5: A broad-based disc protrusion is present. Moderate facet hypertrophy is worse on left. This results in severe central canal stenosis. Severe left and moderate right foraminal narrowing is present.  L5-S1: A mild broad-based disc bulge is present. Moderate facet hypertrophy present on the right. This leads to mild right  foraminal stenosis.  IMPRESSION: 1. Moderate central and bilateral foraminal stenosis at L2-3 is worse on the right. A broad-based disc protrusion present with the superior disc extrusion extending 11 mm above the inferior endplate of L2 in the right lateral recess. 2. Severe central canal stenosis at L3-4 and L4-5 with crowding of the nerve roots above both levels. 3. Moderate foraminal narrowing bilaterally at L3-4. 4. Severe left and moderate right foraminal stenosis at L4-5. 5. Mild right foraminal narrowing at L5-S1. 6. Remote superior endplate compression fracture at L1.   Electronically Signed By: San Morelle M.D. On: 04/20/2020 22:27  Lumbar DG 2-3 views: Results for orders placed during the hospital encounter of 08/03/20 DG Lumbar Spine 2-3 Views  Narrative CLINICAL DATA:  Fall, low back pain  EXAM: LUMBAR SPINE - 2-3 VIEW  COMPARISON:  03/15/2020  FINDINGS: Five non rib bearing segments of the lumbar spine. Normal lumbar lordosis. Remote anterior wedge compression deformity of L1 with approximately 20% loss of height is unchanged. No acute fracture or listhesis of the lumbar spine. Remaining vertebral body height has been preserved. There is diffuse intervertebral disc space narrowing and endplate remodeling throughout the lumbar spine, most severe at L4-5, in keeping with changes of moderate to severe degenerative disc disease. Facet arthrosis is noted throughout the lumbar spine, not well characterized on this examination. Atherosclerotic  calcification is seen within the abdominal aorta. The paraspinal soft tissues are otherwise unremarkable.  IMPRESSION: No acute fracture or listhesis of the lumbar spine.  Stable mild L1 compression deformity.  Diffuse moderate to severe degenerative disc and degenerative joint disease throughout the lumbar spine.   Electronically Signed By: Fidela Salisbury MD On: 08/03/2020 20:20        Lumbar DG  (Complete) 4+V: Results for orders placed during the hospital encounter of 03/15/20 DG Lumbar Spine Complete  Narrative CLINICAL DATA:  Progressive low back pain  EXAM: LUMBAR SPINE - COMPLETE 4+ VIEW  COMPARISON:  August 13, 2009 lumbar radiographs. CT abdomen and pelvis with bony reformats June 19, 2014  FINDINGS: Frontal, lateral, spot lumbosacral lateral, and bilateral oblique views were obtained. There are 5 non-rib-bearing lumbar type vertebral bodies. There is anterior wedging of the L1 vertebral body, age uncertain but not present on available prior studies. No other fracture. No evidence spondylolisthesis. There is severe disc space narrowing at L2-3, L3-4, L4-5, and L5-S1, progressed from prior studies. There is facet osteoarthritic change at all levels, most notably at L3-4, L4-5, and L5-S1 bilaterally. There is aortic and iliac artery atherosclerosis.  IMPRESSION: Anterior wedging of the L1 vertebral body, age uncertain but not present on available prior studies, most recent from 16. No other fracture. No spondylolisthesis. Multilevel osteoarthritic change, progressed from prior studies.  Aortic Atherosclerosis (ICD10-I70.0).   Electronically Signed By: Lowella Grip III M.D. On: 03/16/2020 10:02  Lumbar DG Bending views: Results for orders placed during the hospital encounter of 09/07/20 DG Lumbar Spine Complete W/Bend  Narrative CLINICAL DATA:  Low back pain X 1 year.  EXAM: LUMBAR SPINE - COMPLETE WITH BENDING VIEWS  COMPARISON:  X-ray lumbar spine 08/03/2020  FINDINGS: Five non-rib-bearing lumbar vertebral bodies.  Similar-appearing chronic compression fracture of the L1 and L5 vertebral bodies. Multilevel moderate degenerative changes of the spine with severe intervertebral disc space narrowing at several levels. There is no evidence of lumbar spine fracture.  Similar-appearing mild retrolisthesis of L2 on L3. Otherwise alignment is  normal.  Severe atherosclerotic plaque of the abdominal aorta.  IMPRESSION: No acute displaced fracture or traumatic listhesis of the lumbar spine in a patient with multilevel severe degenerative changes as well as chronic L1 and L5 compression fractures.   Electronically Signed By: Iven Finn M.D. On: 09/07/2020 21:27        Hip Imaging: Hip-R DG 2-3 views: Results for orders placed during the hospital encounter of 08/03/20 DG Hip Unilat W or Wo Pelvis 2-3 Views Right  Narrative CLINICAL DATA:  Fall, right hip pain  EXAM: DG HIP (WITH OR WITHOUT PELVIS) 2-3V RIGHT  COMPARISON:  None.  FINDINGS: Single view radiograph pelvis and two view radiograph of the right hip demonstrates normal alignment. No fracture or dislocation. Mild bilateral degenerative hip arthritis is present with asymmetric joint space narrowing. Vascular calcifications are seen within the pelvis. Degenerative changes are seen within the lower lumbar spine, not well characterized on this examination.  IMPRESSION: No acute fracture or dislocation.   Electronically Signed By: Fidela Salisbury MD On: 08/03/2020 20:16  Knee Imaging: Knee-R MR wo contrast: Results for orders placed during the hospital encounter of 03/19/16 MR Knee Right Wo Contrast  Narrative CLINICAL DATA:  Right knee pain since falling on 02/13/2016. EXAM: MRI OF THE RIGHT KNEE WITHOUT CONTRAST TECHNIQUE: Multiplanar, multisequence MR imaging of the knee was performed. No intravenous contrast was administered. COMPARISON:  None. FINDINGS: MENISCI Medial  meniscus: Severely degenerated and torn posterior horn and mid body region. There is a flap type tear in the mid body region with a flipped meniscal fragment in the medial gutter. The posterior horn appears macerated. Lateral meniscus:  Intact LIGAMENTS Cruciates:  Intact Collaterals:  Intact CARTILAGE Patellofemoral: Moderate degenerative chondrosis/  chondromalacia with early joint space narrowing and spurring. Medial: Severe degenerative chondrosis with areas of full or near full-thickness cartilage loss, joint space narrowing, osteophytic spurring and marked stress edema in the tibia. Lateral:  Mild degenerative chondrosis. Joint:  Moderate-sized joint effusion and mild synovitis. Popliteal Fossa:  Moderate to large Baker's cyst. Extensor Mechanism: The patella retinacular structures are intact and the quadriceps and patellar tendons are intact. Bones: There is a subchondral stress fracture or small focus of spontaneous osteonecrosis involving the medial tibial plateau with a small depressed fragment. Significant surrounding marrow edema. Other: None IMPRESSION: 1. Severely degenerated and torn medial meniscus as discussed above. The meniscus is extruded medially and dysfunctional. 2. Significant medial compartment degenerative changes. 3. Subchondral stress fracture versus small focus of spontaneous osteonecrosis involving the medial tibial plateau with slight depression and significant surrounding marrow edema. 4. Intact ligamentous structures. 5. Moderate MCL and pes anserine bursitis. 6. Moderate-sized joint effusion and moderate to large Baker's cyst. Electronically Signed By: Marijo Sanes M.D. On: 03/19/2016 12:28  Knee-R CT wo contrast: Results for orders placed during the hospital encounter of 06/06/16 CT KNEE RIGHT WO CONTRAST  Narrative CLINICAL DATA:  Right knee pain for 4 months. No known injury. pre-op for knee replacement ([facility] Knee protocol)  EXAM: CT OF THE RIGHT KNEE WITHOUT CONTRAST  TECHNIQUE: Multidetector CT imaging of the right knee was performed according to the standard protocol. Multiplanar CT image reconstructions were also generated.  COMPARISON:  MRI 03/19/2016.  FINDINGS: Bones/Joint/Cartilage  There is progressive subchondral sclerosis and fragmentation of the medial tibial plateau  compared with previous MRI. Underlying subchondral insufficiency fracture is best seen on the coronal images. There is stable medial compartment joint space narrowing. No new fractures are identified. There is a small knee joint effusion.  This protocol includes axial imaging through the right hip and ankle for surgical planning. No significant findings are present at the right hip. There is mild osteitis pubis. There are mild degenerative changes at the ankle and calcaneal cuboid joint.  Ligaments  Not relevant for exam/indication.  Muscles and Tendons  Unremarkable.  The extensor mechanism is intact.  Soft tissues  Moderate-sized Baker's cyst is similar to the previous study. Femoral popliteal atherosclerosis noted.  IMPRESSION: 1. Imaging for surgical planning for knee arthroplasty. 2. Progressive subchondral sclerosis and fragmentation of the medial tibial plateau from underlying subchondral insufficiency fracture. 3. Moderate-sized Baker's cyst.   Electronically Signed By: Richardean Sale M.D. On: 06/06/2016 16:12  Knee-R DG 1-2 views: Results for orders placed during the hospital encounter of 07/03/16 DG Knee 1-2 Views Right  Narrative CLINICAL DATA:  Status post right total knee replacement.  EXAM: RIGHT KNEE - 1-2 VIEW  COMPARISON:  Radiographs of April 19, 2016.  FINDINGS: The femoral and tibial components appear to be well situated. No fracture or dislocation is noted. Expected postoperative findings are noted in the anterior soft tissues.  IMPRESSION: Status post right total knee arthroplasty.   Electronically Signed By: Marijo Conception, M.D. On: 07/03/2016 09:51  Knee-R DG 3 views: Results for orders placed during the hospital encounter of 04/19/16 DG Knee 2 Views Right  Narrative CLINICAL DATA:  Elective  surgery.  EXAM: DG C-ARM 61-120 MIN; RIGHT KNEE - 3 VIEW  Radiation exposure index:  4.8 mGy.  COMPARISON:  MRI of March 19, 2016.  FINDINGS: Two intraoperative fluoroscopic images of the right knee demonstrate mild narrowing of the medial joint space.  IMPRESSION: Mild narrowing of medial joint space.   Electronically Signed By: Marijo Conception, M.D. On: 04/19/2016 10:22  Complexity Note: Imaging results reviewed. Results shared with Ms. Rascon, using Layman's terms.          Meds   Current Outpatient Medications:  .  albuterol (VENTOLIN HFA) 108 (90 Base) MCG/ACT inhaler, Inhale 2 puffs into the lungs every 6 (six) hours as needed for wheezing or shortness of breath., Disp: 8 g, Rfl: 1 .  apixaban (ELIQUIS) 5 MG TABS tablet, Take 1 tablet (5 mg total) by mouth 2 (two) times daily., Disp: 60 tablet, Rfl: 11 .  Aspirin-Acetaminophen-Caffeine (GOODY HEADACHE PO), Take 1 packet by mouth daily as needed (headaches/pain)., Disp: , Rfl:  .  atorvastatin (LIPITOR) 80 MG tablet, Take 1 tablet (80 mg total) by mouth daily at 6 PM., Disp: 90 tablet, Rfl: 1 .  Cobalamin Combinations (B-12) [phone] MCG SUBL, Place under the tongue., Disp: , Rfl:  .  Cyanocobalamin (VITAMIN B-12) 5000 MCG SUBL, Place 1 tablet (5,000 mcg total) under the tongue daily., Disp: 30 tablet, Rfl: 2 .  cyclobenzaprine (FLEXERIL) 5 MG tablet, Take 1 tablet (5 mg total) by mouth at bedtime., Disp: 30 tablet, Rfl: 1 .  docusate sodium (COLACE) 100 MG capsule, Take 100 mg by mouth daily as needed for mild constipation. , Disp: , Rfl:  .  donepezil (ARICEPT) 5 MG tablet, Take 1 tablet (5 mg total) by mouth at bedtime., Disp: 90 tablet, Rfl: 1 .  ergocalciferol (DRISDOL) 1.25 MG (50000 UT) capsule, Take 1 capsule (50,000 Units total) by mouth every Friday., Disp: 4 capsule, Rfl: 3 .  gabapentin (NEURONTIN) 100 MG capsule, Take 100 mg by mouth 3 (three) times daily., Disp: , Rfl:  .  HYDROcodone-acetaminophen (NORCO/VICODIN) 5-325 MG tablet, Take 1-2 tablets by mouth 2 (two) times daily as needed for severe pain., Disp: 15 tablet, Rfl: 0 .   levothyroxine (SYNTHROID) 75 MCG tablet, Take 1 tablet (75 mcg total) by mouth daily before breakfast., Disp: 90 tablet, Rfl: 0 .  losartan (COZAAR) 25 MG tablet, Take 1 tablet (25 mg total) by mouth daily., Disp: 30 tablet, Rfl: 2 .  metoprolol succinate (TOPROL-XL) 50 MG 24 hr tablet, Take 2 tablets (100 mg total) by mouth daily., Disp: 180 tablet, Rfl: 0 .  venlafaxine XR (EFFEXOR-XR) 75 MG 24 hr capsule, TAKE 2 CAPSULES BY MOUTH DAILY. (Patient taking differently: Take 150 mg by mouth daily with breakfast.), Disp: 180 capsule, Rfl: 1  ROS  Constitutional: Denies any fever or chills Gastrointestinal: No reported hemesis, hematochezia, vomiting, or acute GI distress Musculoskeletal: Denies any acute onset joint swelling, redness, loss of ROM, or weakness Neurological: No reported episodes of acute onset apraxia, aphasia, dysarthria, agnosia, amnesia, paralysis, loss of coordination, or loss of consciousness  Allergies  Ms. Rovito is allergic to heparin.  PFSH  Drug: Ms. Wurster  reports no history of drug use. Alcohol:  reports no history of alcohol use. Tobacco:  reports that she has been smoking cigarettes. She has a 13.25 pack-year smoking history. She has never used smokeless tobacco. Medical:  has a past medical history of Anxiety, Arthritis, Asthma, Chronic lower back pain, Complication of anesthesia, COPD (chronic  obstructive pulmonary disease) ([place]), DDD (degenerative disc disease), lumbar, Depression, Diverticulosis, GERD (gastroesophageal reflux disease), High cholesterol, Hypertension, Hypothyroidism, Memory loss, On home oxygen therapy, Peripheral vascular disease ([place]), Pre-diabetes, Shortness of breath dyspnea, and Stroke ([place]). Surgical: Ms. Fowers  has a past surgical history that includes Tonsillectomy (~ 1950); Excisional hemorrhoidectomy (1960's); Total knee arthroplasty (Left, 2008); Knee arthroscopy (Left, X 2 <2008); Vaginal hysterectomy (1974); Tubal ligation (1973);  Cardiac catheterization (~ 2007); Shoulder open rotator cuff repair (Right, 2001); Eye surgery (Bilateral); Knee arthroscopy with subchondroplasty (Right, 04/19/2016); Total knee arthroplasty (Right, 07/03/2016); and LOOP RECORDER INSERTION (N/A, 01/02/2018). Family: family history includes Congestive Heart Failure in her father and sister; Diabetes in her mother; Hypertension in her mother; Lung cancer in her mother.  Constitutional Exam  General appearance: Well nourished, well developed, and well hydrated. In no apparent acute distress Vitals:   09/19/20 0808  BP: (!) 149/59  Pulse: 72  Resp: 16  Temp: (!) 97.3 F (36.3 C)  SpO2: 99%  Weight: 211 lb (95.7 kg)  Height: '5\' 6"'  (1.676 m)   BMI Assessment: Estimated body mass index is 34.06 kg/m as calculated from the following:   Height as of this encounter: '5\' 6"'  (1.676 m).   Weight as of this encounter: 211 lb (95.7 kg).  BMI interpretation table: BMI level Category Range association with higher incidence of chronic pain  <18 kg/m2 Underweight   18.5-24.9 kg/m2 Ideal body weight   25-29.9 kg/m2 Overweight Increased incidence by 20%  30-34.9 kg/m2 Obese (Class I) Increased incidence by 68%  35-39.9 kg/m2 Severe obesity (Class II) Increased incidence by 136%  >40 kg/m2 Extreme obesity (Class III) Increased incidence by 254%   Patient's current BMI Ideal Body weight  Body mass index is 34.06 kg/m. Ideal body weight: 59.3 kg (130 lb 11.7 oz) Adjusted ideal body weight: 73.9 kg (162 lb 13.4 oz)   BMI Readings from Last 4 Encounters:  09/19/20 34.06 kg/m  09/13/20 34.30 kg/m  09/07/20 34.70 kg/m  08/08/20 34.64 kg/m   Wt Readings from Last 4 Encounters:  09/19/20 211 lb (95.7 kg)  09/13/20 212 lb 8 oz (96.4 kg)  09/07/20 215 lb (97.5 kg)  08/08/20 214 lb 9.6 oz (97.3 kg)    Psych/Mental status: Alert, oriented x 3 (person, place, & time)       Eyes: PERLA Respiratory: No evidence of acute respiratory  distress  Assessment & Plan  Primary Diagnosis & Pertinent Problem List: The primary encounter diagnosis was Chronic pain syndrome. Diagnoses of Chronic lower extremity pain (1ry area of Pain) (Right), Chronic low back pain (2ry area of Pain) (Bilateral) (R>L) w/ sciatica (Right), Compression fracture of L1 lumbar vertebra, sequela, Compression fracture of L5 vertebra, sequela, Grade 1 Lumbar Retrolisthesis (L2/3), Grade 1 Lumbar Anterolisthesis (L3/4 and L4/5), Lumbar central spinal stenosis (Moderate: L2-3  Severe: L3-4, L4-5) w/ neurogenic claudication, Lumbar foraminal stenosis (Bilateral: L2-3, L3-4, L4-5) (Right: L5-S1) (Severe left L4-5), Lumbar lateral recess stenosis (Right: L2-3), Lumbar radiculitis (L2 dermatome) (Right), Lumbosacral radiculopathy at L2 (Right), Lumbar facet hypertrophy (Multilevel) (Bilateral), Chronic anticoagulation (Eliquis), and Pharmacologic therapy were also pertinent to this visit.  Visit Diagnosis: 1. Chronic pain syndrome   2. Chronic lower extremity pain (1ry area of Pain) (Right)   3. Chronic low back pain (2ry area of Pain) (Bilateral) (R>L) w/ sciatica (Right)   4. Compression fracture of L1 lumbar vertebra, sequela   5. Compression fracture of L5 vertebra, sequela   6. Grade 1 Lumbar Retrolisthesis (L2/3)  7. Grade 1 Lumbar Anterolisthesis (L3/4 and L4/5)   8. Lumbar central spinal stenosis (Moderate: L2-3  Severe: L3-4, L4-5) w/ neurogenic claudication   9. Lumbar foraminal stenosis (Bilateral: L2-3, L3-4, L4-5) (Right: L5-S1) (Severe left L4-5)   10. Lumbar lateral recess stenosis (Right: L2-3)   11. Lumbar radiculitis (L2 dermatome) (Right)   12. Lumbosacral radiculopathy at L2 (Right)   13. Lumbar facet hypertrophy (Multilevel) (Bilateral)   14. Chronic anticoagulation (Eliquis)   15. Pharmacologic therapy    Problems updated and reviewed during this visit: Problem  Chronic anticoagulation (Eliquis)   (Stop: 3 days  Restart: 6 hrs)      Plan of Care  Pharmacotherapy (Medications Ordered): No orders of the defined types were placed in this encounter.   Procedure Orders     Lumbar Transforaminal Epidural     Lumbar Epidural Injection  Lab Orders     Compliance Drug Analysis, Ur Imaging Orders  No imaging studies ordered today    Referral Orders     Ambulatory referral to Neurosurgery  Pharmacological management options:  Opioid Analgesics: We will not be taking over the patient's opioid analgesics on this particular visit. Membrane stabilizer: None prescribed at this time Muscle relaxant: None prescribed at this time NSAID: Medically contraindicated Other analgesic(s): None prescribed at this time     Interventional Therapies  Risk  Complexity Considerations:   ELIQUIS Anticoagulation (Stop: 3 days  Restart: 6 hrs) Prior patient of [facility] (Dr. Alba Destine)   Planned  Pending:   Pending further evaluation   Under consideration:   Diagnostic/therapeutic right L1 & L2 transforaminal ESI #1    Completed:   None at this time   Therapeutic  Palliative (PRN) options:   None established    Provider-requested follow-up: Return for Procedure (w/ sedation): (R) L1 & L2 TFESI #1 + (L) L4-5 LESI #1, (Blood Thinner Protocol). Recent Visits Date Type Provider Dept  09/07/20 Office Visit Milinda Pointer, MD [facility]  Showing recent visits within past 90 days and meeting all other requirements Today's Visits Date Type Provider Dept  09/19/20 Office Visit Milinda Pointer, MD [facility]  Showing today's visits and meeting all other requirements Future Appointments No visits were found meeting these conditions. Showing future appointments within next 90 days and meeting all other requirements  Primary Care Physician: Lavera Guise, MD Note by: Gaspar Cola, MD Date: 09/19/2020; Time: 12:25 PM

## 2020-09-19 NOTE — Patient Instructions (Addendum)
____________________________________________________________________________________________  Preparing for Procedure with Sedation  Procedure appointments are limited to planned procedures: . No Prescription Refills. . No disability issues will be discussed. . No medication changes will be discussed.  Instructions: . Oral Intake: Do not eat or drink anything for at least 8 hours prior to your procedure. (Exception: Blood Pressure Medication. See below.) . Transportation: Unless otherwise stated by your physician, you may drive yourself after the procedure. . Blood Pressure Medicine: Do not forget to take your blood pressure medicine with a sip of water the morning of the procedure. If your Diastolic (lower reading)is above 100 mmHg, elective cases will be cancelled/rescheduled. . Blood thinners: These will need to be stopped for procedures. Notify our staff if you are taking any blood thinners. Depending on which one you take, there will be specific instructions on how and when to stop it. . Diabetics on insulin: Notify the staff so that you can be scheduled 1st case in the morning. If your diabetes requires high dose insulin, take only  of your normal insulin dose the morning of the procedure and notify the staff that you have done so. . Preventing infections: Shower with an antibacterial soap the morning of your procedure. . Build-up your immune system: Take 1000 mg of Vitamin C with every meal (3 times a day) the day prior to your procedure. . Antibiotics: Inform the staff if you have a condition or reason that requires you to take antibiotics before dental procedures. . Pregnancy: If you are pregnant, call and cancel the procedure. . Sickness: If you have a cold, fever, or any active infections, call and cancel the procedure. . Arrival: You must be in the facility at least 30 minutes prior to your scheduled procedure. . Children: Do not bring children with you. . Dress appropriately:  Bring dark clothing that you would not mind if they get stained. . Valuables: Do not bring any jewelry or valuables.  Reasons to call and reschedule or cancel your procedure: (Following these recommendations will minimize the risk of a serious complication.) . Surgeries: Avoid having procedures within 2 weeks of any surgery. (Avoid for 2 weeks before or after any surgery). . Flu Shots: Avoid having procedures within 2 weeks of a flu shots or . (Avoid for 2 weeks before or after immunizations). . Barium: Avoid having a procedure within 7-10 days after having had a radiological study involving the use of radiological contrast. (Myelograms, Barium swallow or enema study). . Heart attacks: Avoid any elective procedures or surgeries for the initial 6 months after a "Myocardial Infarction" (Heart Attack). . Blood thinners: It is imperative that you stop these medications before procedures. Let us know if you if you take any blood thinner.  . Infection: Avoid procedures during or within two weeks of an infection (including chest colds or gastrointestinal problems). Symptoms associated with infections include: Localized redness, fever, chills, night sweats or profuse sweating, burning sensation when voiding, cough, congestion, stuffiness, runny nose, sore throat, diarrhea, nausea, vomiting, cold or Flu symptoms, recent or current infections. It is specially important if the infection is over the area that we intend to treat. . Heart and lung problems: Symptoms that may suggest an active cardiopulmonary problem include: cough, chest pain, breathing difficulties or shortness of breath, dizziness, ankle swelling, uncontrolled high or unusually low blood pressure, and/or palpitations. If you are experiencing any of these symptoms, cancel your procedure and contact your primary care physician for an evaluation.  Remember:  Regular Business hours are:    Monday to Thursday 8:00 AM to 4:00 PM  Provider's  Schedule: Jonavon Trieu, MD:  Procedure days: Tuesday and Thursday 7:30 AM to 4:00 PM  Bilal Lateef, MD:  Procedure days: Monday and Wednesday 7:30 AM to 4:00 PM ____________________________________________________________________________________________   ____________________________________________________________________________________________  General Risks and Possible Complications  Patient Responsibilities: It is important that you read this as it is part of your informed consent. It is our duty to inform you of the risks and possible complications associated with treatments offered to you. It is your responsibility as a patient to read this and to ask questions about anything that is not clear or that you believe was not covered in this document.  Patient's Rights: You have the right to refuse treatment. You also have the right to change your mind, even after initially having agreed to have the treatment done. However, under this last option, if you wait until the last second to change your mind, you may be charged for the materials used up to that point.  Introduction: Medicine is not an exact science. Everything in Medicine, including the lack of treatment(s), carries the potential for danger, harm, or loss (which is by definition: Risk). In Medicine, a complication is a secondary problem, condition, or disease that can aggravate an already existing one. All treatments carry the risk of possible complications. The fact that a side effects or complications occurs, does not imply that the treatment was conducted incorrectly. It must be clearly understood that these can happen even when everything is done following the highest safety standards.  No treatment: You can choose not to proceed with the proposed treatment alternative. The "PRO(s)" would include: avoiding the risk of complications associated with the therapy. The "CON(s)" would include: not getting any of the treatment  benefits. These benefits fall under one of three categories: diagnostic; therapeutic; and/or palliative. Diagnostic benefits include: getting information which can ultimately lead to improvement of the disease or symptom(s). Therapeutic benefits are those associated with the successful treatment of the disease. Finally, palliative benefits are those related to the decrease of the primary symptoms, without necessarily curing the condition (example: decreasing the pain from a flare-up of a chronic condition, such as incurable terminal cancer).  General Risks and Complications: These are associated to most interventional treatments. They can occur alone, or in combination. They fall under one of the following six (6) categories: no benefit or worsening of symptoms; bleeding; infection; nerve damage; allergic reactions; and/or death. 1. No benefits or worsening of symptoms: In Medicine there are no guarantees, only probabilities. No healthcare provider can ever guarantee that a medical treatment will work, they can only state the probability that it may. Furthermore, there is always the possibility that the condition may worsen, either directly, or indirectly, as a consequence of the treatment. 2. Bleeding: This is more common if the patient is taking a blood thinner, either prescription or over the counter (example: Goody Powders, Fish oil, Aspirin, Garlic, etc.), or if suffering a condition associated with impaired coagulation (example: Hemophilia, cirrhosis of the liver, low platelet counts, etc.). However, even if you do not have one on these, it can still happen. If you have any of these conditions, or take one of these drugs, make sure to notify your treating physician. 3. Infection: This is more common in patients with a compromised immune system, either due to disease (example: diabetes, cancer, human immunodeficiency virus [HIV], etc.), or due to medications or treatments (example: therapies used to treat  cancer and   rheumatological diseases). However, even if you do not have one on these, it can still happen. If you have any of these conditions, or take one of these drugs, make sure to notify your treating physician. 4. Nerve Damage: This is more common when the treatment is an invasive one, but it can also happen with the use of medications, such as those used in the treatment of cancer. The damage can occur to small secondary nerves, or to large primary ones, such as those in the spinal cord and brain. This damage may be temporary or permanent and it may lead to impairments that can range from temporary numbness to permanent paralysis and/or brain death. 5. Allergic Reactions: Any time a substance or material comes in contact with our body, there is the possibility of an allergic reaction. These can range from a mild skin rash (contact dermatitis) to a severe systemic reaction (anaphylactic reaction), which can result in death. 6. Death: In general, any medical intervention can result in death, most of the time due to an unforeseen complication. ____________________________________________________________________________________________  ____________________________________________________________________________________________  Blood Thinners  IMPORTANT NOTICE:  If you take any of these, make sure to notify the nursing staff.  Failure to do so may result in injury.  Recommended time intervals to stop and restart blood-thinners, before & after invasive procedures  Generic Name Brand Name Stop Time. Must be stopped at least this long before procedures. After procedures, wait at least this long before re-starting.  Abciximab Reopro 15 days 2 hrs  Alteplase Activase 10 days 10 days  Anagrelide Agrylin    Apixaban Eliquis 3 days 6 hrs  Cilostazol Pletal 3 days 5 hrs  Clopidogrel Plavix 7-10 days 2 hrs  Dabigatran Pradaxa 5 days 6 hrs  Dalteparin Fragmin 24 hours 4 hrs  Dipyridamole Aggrenox  11days 2 hrs  Edoxaban Lixiana; Savaysa 3 days 2 hrs  Enoxaparin  Lovenox 24 hours 4 hrs  Eptifibatide Integrillin 8 hours 2 hrs  Fondaparinux  Arixtra 72 hours 12 hrs  Hydroxychloroquine Plaquenil 11 days   Prasugrel Effient 7-10 days 6 hrs  Reteplase Retavase 10 days 10 days  Rivaroxaban Xarelto 3 days 6 hrs  Ticagrelor Brilinta 5-7 days 6 hrs  Ticlopidine Ticlid 10-14 days 2 hrs  Tinzaparin Innohep 24 hours 4 hrs  Tirofiban Aggrastat 8 hours 2 hrs  Warfarin Coumadin 5 days 2 hrs   Other medications with blood-thinning effects  Product indications Generic (Brand) names Note  Cholesterol Lipitor Stop 4 days before procedure  Blood thinner (injectable) Heparin (LMW or LMWH Heparin) Stop 24 hours before procedure  Cancer Ibrutinib (Imbruvica) Stop 7 days before procedure  Malaria/Rheumatoid Hydroxychloroquine (Plaquenil) Stop 11 days before procedure  Thrombolytics  10 days before or after procedures   Over-the-counter (OTC) Products with blood-thinning effects  Product Common names Stop Time  Aspirin > 325 mg Goody Powders, Excedrin, etc. 11 days  Aspirin ? 81 mg  7 days  Fish oil  4 days  Garlic supplements  7 days  Ginkgo biloba  36 hours  Ginseng  24 hours  NSAIDs Ibuprofen, Naprosyn, etc. 3 days  Vitamin E  4 days   ____________________________________________________________________________________________  ____________________________________________________________________________________________  CBD (cannabidiol) WARNING  Applicable to: All individuals currently taking or considering taking CBD (cannabidiol) and, more important, all patients taking opioid analgesic controlled substances (pain medication). (Example: oxycodone; oxymorphone; hydrocodone; hydromorphone; morphine; methadone; tramadol; tapentadol; fentanyl; buprenorphine; butorphanol; dextromethorphan; meperidine; codeine; etc.)  Legal status: CBD remains a Schedule I drug prohibited for any use.  CBD is  illegal with one exception. In the [place], CBD has a limited Transport planner (FDA) approval for the treatment of two specific types of epilepsy disorders. Only one CBD product has been approved by the FDA for this purpose: "Epidiolex". FDA is aware that some companies are marketing products containing cannabis and cannabis-derived compounds in ways that violate the [employer], Drug and Cosmetic Act [facility] Act) and that may put the health and safety of consumers at risk. The FDA, a Federal agency, has not enforced the CBD status since 2018.   Legality: Some manufacturers ship CBD products nationally, which is illegal. Often such products are sold online and are therefore available throughout the country. CBD is openly sold in head shops and health food stores in some states where such sales have not been explicitly legalized. Selling unapproved products with unsubstantiated therapeutic claims is not only a violation of the law, but also can put patients at risk, as these products have not been proven to be safe or effective. Federal illegality makes it difficult to conduct research on CBD.  Reference: "FDA Regulation of Cannabis and Cannabis-Derived Products, Including Cannabidiol (CBD)" - [employer]  Warning: CBD is not FDA approved and has not undergo the same manufacturing controls as prescription drugs.  This means that the purity and safety of available CBD may be questionable. Most of the time, despite manufacturer's claims, it is contaminated with THC (delta-9-tetrahydrocannabinol - the chemical in marijuana responsible for the "HIGH").  When this is the case, the [facility] contaminant will trigger a positive urine drug screen (UDS) test for Marijuana (carboxy-THC). Because a positive UDS for any illicit substance is a violation of our medication agreement, your opioid  analgesics (pain medicine) may be permanently discontinued.  MORE ABOUT CBD  General Information: CBD  is a derivative of the Marijuana (cannabis sativa) plant discovered in 80. It is one of the 113 identified substances found in Marijuana. It accounts for up to 40% of the plant's extract. As of 2018, preliminary clinical studies on CBD included research for the treatment of anxiety, movement disorders, and pain. CBD is available and consumed in multiple forms, including inhalation of smoke or vapor, as an aerosol spray, and by mouth. It may be supplied as an oil containing CBD, capsules, dried cannabis, or as a liquid solution. CBD is thought not to be as psychoactive as THC (delta-9-tetrahydrocannabinol - the chemical in marijuana responsible for the "HIGH"). Studies suggest that CBD may interact with different biological target receptors in the body, including cannabinoid and other neurotransmitter receptors. As of 2018 the mechanism of action for its biological effects has not been determined.  Side-effects  Adverse reactions: Dry mouth, diarrhea, decreased appetite, fatigue, drowsiness, malaise, weakness, sleep disturbances, and others.  Drug interactions: CBC may interact with other medications such as blood-thinners. (Last update: 03/26/2020) ____________________________________________________________________________________________   Preparing for Procedure with Sedation Instructions: . Oral Intake: Do not eat or drink anything for at least 8 hours prior to your procedure. . Transportation: Public transportation is not allowed. Bring an adult driver. The driver must be physically present in our waiting room before any procedure can be started. Marland Kitchen Physical Assistance: Bring an adult capable of physically assisting you, in the event you need help. . Blood Pressure Medicine: Take your blood pressure medicine with a sip of water the morning of the procedure. . Insulin: Take only  of your  normal insulin dose. . Preventing infections: Shower with an antibacterial soap the morning  of your procedure. . Build-up your immune system: Take 1000 mg of Vitamin C with every meal (3 times a day) the day prior to your procedure. . Pregnancy: If you are pregnant, call and cancel the procedure. . Sickness: If you have a cold, fever, or any active infections, call and cancel the procedure. . Arrival: You must be in the facility at least 30 minutes prior to your scheduled procedure. . Children: Do not bring children with you. . Dress appropriately: Bring dark clothing that you would not mind if they get stained. . Valuables: Do not bring any jewelry or valuables. Procedure appointments are reserved for interventional treatments only. Marland Kitchen No Prescription Refills. . No medication changes will be discussed during procedure appointments. . No disability issues will be discussed. Epidural Steroid Injection Patient Information  Description: The epidural space surrounds the nerves as they exit the spinal cord.  In some patients, the nerves can be compressed and inflamed by a bulging disc or a tight spinal canal (spinal stenosis).  By injecting steroids into the epidural space, we can bring irritated nerves into direct contact with a potentially helpful medication.  These steroids act directly on the irritated nerves and can reduce swelling and inflammation which often leads to decreased pain.  Epidural steroids may be injected anywhere along the spine and from the neck to the low back depending upon the location of your pain.   After numbing the skin with local anesthetic (like Novocaine), a small needle is passed into the epidural space slowly.  You may experience a sensation of pressure while this is being done.  The entire block usually last less than 10 minutes.  Conditions which may be treated by epidural steroids:  Low back and leg pain Neck and arm pain Spinal stenosis Post-laminectomy  syndrome Herpes zoster (shingles) pain Pain from compression fractures  Preparation for the injection:  Do not eat any solid food or dairy products within 8 hours of your appointment.  You may drink clear liquids up to 3 hours before appointment.  Clear liquids include water, black coffee, juice or soda.  No milk or cream please. You may take your regular medication, including pain medications, with a sip of water before your appointment  Diabetics should hold regular insulin (if taken separately) and take 1/2 normal NPH dos the morning of the procedure.  Carry some sugar containing items with you to your appointment. A driver must accompany you and be prepared to drive you home after your procedure.  Bring all your current medications with your. An IV may be inserted and sedation may be given at the discretion of the physician.   A blood pressure cuff, EKG and other monitors will often be applied during the procedure.  Some patients may need to have extra oxygen administered for a short period. You will be asked to provide medical information, including your allergies, prior to the procedure.  We must know immediately if you are taking blood thinners (like Coumadin/Warfarin)  Or if you are allergic to IV iodine contrast (dye). We must know if you could possible be pregnant.  Possible side-effects: Bleeding from needle site Infection (rare, may require surgery) Nerve injury (rare) Numbness & tingling (temporary) Difficulty urinating (rare, temporary) Spinal headache ( a headache worse with upright posture) Light -headedness (temporary) Pain at injection site (several days) Decreased blood pressure (temporary) Weakness in arm/leg (temporary) Pressure sensation in back/neck (temporary)  Call if you experience: Fever/chills associated with headache or increased back/neck pain. Headache  worsened by an upright position. New onset weakness or numbness of an extremity below the injection  site Hives or difficulty breathing (go to the emergency room) Inflammation or drainage at the infection site Severe back/neck pain Any new symptoms which are concerning to you  Please note:  Although the local anesthetic injected can often make your back or neck feel good for several hours after the injection, the pain will likely return.  It takes 3-7 days for steroids to work in the epidural space.  You may not notice any pain relief for at least that one week.  If effective, we will often do a series of three injections spaced 3-6 weeks apart to maximally decrease your pain.  After the initial series, we generally will wait several months before considering a repeat injection of the same type.  If you have any questions, please call [phone] [facility]

## 2020-09-19 NOTE — Telephone Encounter (Signed)
Dr Dossie Arbour would like permission to stop Eliquis for 3 days for Lumbar Epidural steroid injection.  Thank you

## 2020-09-19 NOTE — Progress Notes (Signed)
Safety precautions to be maintained throughout the outpatient stay will include: orient to surroundings, keep bed in low position, maintain call bell within reach at all times, provide assistance with transfer out of bed and ambulation.  

## 2020-09-20 ENCOUNTER — Other Ambulatory Visit: Payer: Self-pay

## 2020-09-20 ENCOUNTER — Ambulatory Visit
Admission: RE | Admit: 2020-09-20 | Discharge: 2020-09-20 | Disposition: A | Discharge status: Home / Self Care | Payer: Medicare Other | Source: Ambulatory Visit | Attending: Pain Medicine | Admitting: Pain Medicine

## 2020-09-20 DIAGNOSIS — Z90710 Acquired absence of both cervix and uterus: Secondary | ICD-10-CM | POA: Diagnosis not present

## 2020-09-20 DIAGNOSIS — M8589 Other specified disorders of bone density and structure, multiple sites: Secondary | ICD-10-CM | POA: Diagnosis not present

## 2020-09-20 DIAGNOSIS — Z8709 Personal history of other diseases of the respiratory system: Secondary | ICD-10-CM | POA: Diagnosis not present

## 2020-09-20 DIAGNOSIS — E559 Vitamin D deficiency, unspecified: Secondary | ICD-10-CM | POA: Diagnosis not present

## 2020-09-20 DIAGNOSIS — S32050D Wedge compression fracture of fifth lumbar vertebra, subsequent encounter for fracture with routine healing: Secondary | ICD-10-CM | POA: Insufficient documentation

## 2020-09-20 DIAGNOSIS — M8008XA Age-related osteoporosis with current pathological fracture, vertebra(e), initial encounter for fracture: Secondary | ICD-10-CM | POA: Diagnosis not present

## 2020-09-20 DIAGNOSIS — S32010S Wedge compression fracture of first lumbar vertebra, sequela: Secondary | ICD-10-CM | POA: Insufficient documentation

## 2020-09-20 DIAGNOSIS — R29890 Loss of height: Secondary | ICD-10-CM | POA: Diagnosis not present

## 2020-09-20 NOTE — Telephone Encounter (Signed)
We had miscommunicated about her anticoagulation and she was 9 months without anticoagulation   I suspect her risks are very low to hold for the steroid injection-- just started recently Thanks SK

## 2020-09-21 ENCOUNTER — Other Ambulatory Visit: Payer: Medicare Other

## 2020-09-21 NOTE — Telephone Encounter (Signed)
Will you please call her daughter and schedule her for her procedure that was ordered at her last visit.  I believe it was a LESI and TFESI.  Thank you!

## 2020-09-22 ENCOUNTER — Telehealth: Payer: Self-pay

## 2020-09-22 NOTE — Telephone Encounter (Signed)
Voicemail left with daughter Larene Beach, informed her that UDS results are not yet here, she has appt for a facet block on 09-27-20, will discuss at that time. Instructed her on the VM to call back if any further questions.

## 2020-09-22 NOTE — Progress Notes (Signed)
Carelink Summary Report / Loop Recorder 

## 2020-09-22 NOTE — Telephone Encounter (Signed)
Patients daughter called to see if the UDS results are in, and if so what can he do for her, she is hurting.

## 2020-09-23 DIAGNOSIS — I10 Essential (primary) hypertension: Secondary | ICD-10-CM | POA: Diagnosis not present

## 2020-09-23 DIAGNOSIS — R42 Dizziness and giddiness: Secondary | ICD-10-CM | POA: Diagnosis not present

## 2020-09-23 DIAGNOSIS — I69398 Other sequelae of cerebral infarction: Secondary | ICD-10-CM | POA: Diagnosis not present

## 2020-09-23 DIAGNOSIS — M545 Low back pain, unspecified: Secondary | ICD-10-CM | POA: Diagnosis not present

## 2020-09-23 DIAGNOSIS — G8929 Other chronic pain: Secondary | ICD-10-CM | POA: Diagnosis not present

## 2020-09-23 DIAGNOSIS — J449 Chronic obstructive pulmonary disease, unspecified: Secondary | ICD-10-CM | POA: Diagnosis not present

## 2020-09-23 LAB — COMPLIANCE DRUG ANALYSIS, UR

## 2020-09-26 NOTE — Progress Notes (Signed)
PROVIDER NOTE: Information contained herein reflects review and annotations entered in association with encounter. Interpretation of such information and data should be left to medically-trained personnel. Information provided to patient can be located elsewhere in the medical record under "Patient Instructions". Document created using STT-dictation technology, any transcriptional errors that may result from process are unintentional.    Patient: [name] Douglas  Service Category: Procedure  Provider: Gaspar Cola, MD  DOB: Oct 04, 1940  DOS: 09/27/2020  Location: [facility]  MRN: [id number]  Setting: Ambulatory - outpatient  Referring Provider: Milinda Pointer, MD  Type: Established Patient  Specialty: Interventional Pain Management  PCP: Lavera Guise, MD   Primary Reason for Visit: Interventional Pain Management Treatment. CC: Back Pain  Procedure #1:  Anesthesia, Analgesia, Anxiolysis:  Type: Therapeutic Trans-Foraminal Epidural Steroid Injection   #1  Region: Lumbar Level: L1 & L2 Paravertebral Laterality: Right Paravertebral   Type: Moderate (Conscious) Sedation combined with Local Anesthesia Indication(s): Analgesia and Anxiety Route: Intravenous (IV) IV Access: Secured Sedation: Meaningful verbal contact was maintained at all times during the procedure  Local Anesthetic: Lidocaine 1-2%  Position: Prone  Procedure #2:    Type: Therapeutic Inter-Laminar Epidural Steroid Injection   #1  Region: Lumbar Level: L4-5 Level. Laterality: Left-Sided Paramedial     Indications: 1. DDD (degenerative disc disease), lumbosacral   2. Chronic lower extremity pain (1ry area of Pain) (Right)   3. Lumbar radiculitis (L2 dermatome) (Right)   4. Lumbosacral radiculopathy at L2 (Right)   5. Lumbar lateral recess stenosis (Right: L2-3)   6. Compression fracture of L1 lumbar vertebra, sequela   7. Grade 1 Lumbar Retrolisthesis (L2/3)   8. Lumbar central spinal stenosis  (Moderate: L2-3  Severe: L3-4, L4-5) w/ neurogenic claudication   9. Chronic low back pain (2ry area of Pain) (Bilateral) (R>L) w/ sciatica (Right)   10. Compression fracture of L5 vertebra, sequela   11. Chronic lumbar radiculopathy (Right)   12. Grade 1 Lumbar Anterolisthesis (L3/4 and L4/5)   13. Lumbar foraminal stenosis (Bilateral: L2-3, L3-4, L4-5) (Right: L5-S1) (Severe left L4-5)   14. Chronic anticoagulation (Eliquis)    Pain Score: Pre-procedure: 10-Worst pain ever/10 Post-procedure: 6  (while standing, hips/side of upper legs bilateral)/10   Pre-op H&P Assessment:  [name] Douglas is a 80 y.o. (year old), female patient, seen today for interventional treatment. She  has a past surgical history that includes Tonsillectomy (~ 1950); Excisional hemorrhoidectomy (1960's); Total knee arthroplasty (Left, 2008); Knee arthroscopy (Left, X 2 <2008); Vaginal hysterectomy (1974); Tubal ligation (1973); Cardiac catheterization (~ 2007); Shoulder open rotator cuff repair (Right, 2001); Eye surgery (Bilateral); Knee arthroscopy with subchondroplasty (Right, 04/19/2016); Total knee arthroplasty (Right, 07/03/2016); and LOOP RECORDER INSERTION (N/A, 01/02/2018). [name] Douglas has a current medication list which includes the following prescription(s): albuterol, apixaban, aspirin-acetaminophen-caffeine, atorvastatin, b-12, vitamin b-12, docusate sodium, donepezil, ergocalciferol, gabapentin, hydrocodone-acetaminophen, levothyroxine, losartan, metoprolol succinate, venlafaxine xr, and cyclobenzaprine, and the following Facility-Administered Medications: fentanyl and midazolam. Her primarily concern today is the Back Pain  Initial Vital Signs:  Pulse/HCG Rate: 72ECG Heart Rate: 76 Temp: (!) 97.3 F (36.3 C) Resp: 18 BP: (!) 171/65 SpO2: 98 %  BMI: Estimated body mass index is 36.22 kg/m as calculated from the following:   Height as of this encounter: 5\' 4"  (1.626 m).   Weight as of this encounter: 211 lb  (95.7 kg).  Risk Assessment: Allergies: Reviewed. She is allergic to heparin.  Allergy Precautions: None required Coagulopathies: Reviewed. None identified.  Blood-thinner  therapy: None at this time Active Infection(s): Reviewed. None identified. [name] Douglas is afebrile  Site Confirmation: [name] Douglas was asked to confirm the procedure and laterality before marking the site Procedure checklist: Completed Consent: Before the procedure and under the influence of no sedative(s), amnesic(s), or anxiolytics, the patient was informed of the treatment options, risks and possible complications. To fulfill our ethical and legal obligations, as recommended by the American Medical Association's Code of Ethics, I have informed the patient of my clinical impression; the nature and purpose of the treatment or procedure; the risks, benefits, and possible complications of the intervention; the alternatives, including doing nothing; the risk(s) and benefit(s) of the alternative treatment(s) or procedure(s); and the risk(s) and benefit(s) of doing nothing. The patient was provided information about the general risks and possible complications associated with the procedure. These may include, but are not limited to: failure to achieve desired goals, infection, bleeding, organ or nerve damage, allergic reactions, paralysis, and death. In addition, the patient was informed of those risks and complications associated to Spine-related procedures, such as failure to decrease pain; infection (i.e.: Meningitis, epidural or intraspinal abscess); bleeding (i.e.: epidural hematoma, subarachnoid hemorrhage, or any other type of intraspinal or peri-dural bleeding); organ or nerve damage (i.e.: Any type of peripheral nerve, nerve root, or spinal cord injury) with subsequent damage to sensory, motor, and/or autonomic systems, resulting in permanent pain, numbness, and/or weakness of one or several areas of the body; allergic reactions;  (i.e.: anaphylactic reaction); and/or death. Furthermore, the patient was informed of those risks and complications associated with the medications. These include, but are not limited to: allergic reactions (i.e.: anaphylactic or anaphylactoid reaction(s)); adrenal axis suppression; blood sugar elevation that in diabetics may result in ketoacidosis or comma; water retention that in patients with history of congestive heart failure may result in shortness of breath, pulmonary edema, and decompensation with resultant heart failure; weight gain; swelling or edema; medication-induced neural toxicity; particulate matter embolism and blood vessel occlusion with resultant organ, and/or nervous system infarction; and/or aseptic necrosis of one or more joints. Finally, the patient was informed that Medicine is not an exact science; therefore, there is also the possibility of unforeseen or unpredictable risks and/or possible complications that may result in a catastrophic outcome. The patient indicated having understood very clearly. We have given the patient no guarantees and we have made no promises. Enough time was given to the patient to ask questions, all of which were answered to the patient's satisfaction. Ms. Troncoso has indicated that she wanted to continue with the procedure. Attestation: I, the ordering provider, attest that I have discussed with the patient the benefits, risks, side-effects, alternatives, likelihood of achieving goals, and potential problems during recovery for the procedure that I have provided informed consent. Date  Time: 09/27/2020 12:24 PM  Pre-Procedure Preparation:  Monitoring: As per clinic protocol. Respiration, ETCO2, SpO2, BP, heart rate and rhythm monitor placed and checked for adequate function Safety Precautions: Patient was assessed for positional comfort and pressure points before starting the procedure. Time-out: I initiated and conducted the "Time-out" before starting the  procedure, as per protocol. The patient was asked to participate by confirming the accuracy of the "Time Out" information. Verification of the correct person, site, and procedure were performed and confirmed by me, the nursing staff, and the patient. "Time-out" conducted as per Joint Commission's Universal Protocol (UP.01.01.01). Time: 1300  Description of Procedure #1:  Target Area: The inferior and lateral portion of the pedicle, just lateral  to a line created by the 6:00 position of the pedicle and the superior articular process of the vertebral body below. On the lateral view, this target lies just posterior to the anterior aspect of the lamina and posterior to the midpoint created between the anterior and the posterior aspect of the neural foramina. Approach: Posterior paravertebral approach. Area Prepped: Entire Posterior Lumbosacral Region DuraPrep (Iodine Povacrylex [0.7% available iodine] and Isopropyl Alcohol, 74% w/w) Safety Precautions: Aspiration looking for blood return was conducted prior to all injections. At no point did we inject any substances, as a needle was being advanced. No attempts were made at seeking any paresthesias. Safe injection practices and needle disposal techniques used. Medications properly checked for expiration dates. SDV (single dose vial) medications used.  Description of the Procedure: Protocol guidelines were followed. The patient was placed in position over the fluoroscopy table. The target area was identified and the area prepped in the usual manner. Skin & deeper tissues infiltrated with local anesthetic. Appropriate amount of time allowed to pass for local anesthetics to take effect. The procedure needles were then advanced to the target area. Proper needle placement secured. Negative aspiration confirmed. Solution injected in intermittent fashion, asking for systemic symptoms every 0.2cc of injectate. The needles were then removed and the area cleansed, making  sure to leave some of the prepping solution back to take advantage of its long term bactericidal properties.  Start Time: 1300 hrs.  Materials:  Needle(s) Type: Spinal Needle Gauge: 22G Length: 3.5-in Medication(s): Please see orders for medications and dosing details.  Description of Procedure #2:  Target Area: The  interlaminar space, initially targeting the lower border of the superior vertebral body lamina. Approach: Posterior paramedial approach. Area Prepped: Same as above Prepping solution: Same as above Safety Precautions: Same as above  Description of the Procedure: Protocol guidelines were followed. The patient was placed in position over the fluoroscopy table. The target area was identified and the area prepped in the usual manner. Skin & deeper tissues infiltrated with local anesthetic. Appropriate amount of time allowed to pass for local anesthetics to take effect. The procedure needle was introduced through the skin, ipsilateral to the reported pain, and advanced to the target area. Bone was contacted and the needle walked caudad, until the lamina was cleared. The ligamentum flavum was engaged and loss-of-resistance technique used as the epidural needle was advanced. The epidural space was identified using "loss-of-resistance technique" with 2-3 ml of PF-NaCl (0.9% NSS), in a 5cc LOR glass syringe. Proper needle placement secured. Negative aspiration confirmed. Solution injected in intermittent fashion, asking for systemic symptoms every 0.5cc of injectate. The needles were then removed and the area cleansed, making sure to leave some of the prepping solution back to take advantage of its long term bactericidal properties.  Vitals:   09/27/20 1318 09/27/20 1327 09/27/20 1337 09/27/20 1347  BP: (!) 164/69 (!) 149/69 (!) 175/62 (!) 180/78  Pulse:  74 71 70  Resp: 18 18 16 18   Temp:      TempSrc:      SpO2: 98% 97% 98% 98%  Weight:      Height:        End Time: 1313  hrs.  Materials:  Needle(s) Type: Epidural needle Gauge: 17G Length: 3.5-in Medication(s): Please see orders for medications and dosing details.  Imaging Guidance (Spinal):          Type of Imaging Technique: Fluoroscopy Guidance (Spinal) Indication(s): Assistance in needle guidance and placement for procedures requiring  needle placement in or near specific anatomical locations not easily accessible without such assistance. Exposure Time: Please see nurses notes. Contrast: Before injecting any contrast, we confirmed that the patient did not have an allergy to iodine, shellfish, or radiological contrast. Once satisfactory needle placement was completed at the desired level, radiological contrast was injected. Contrast injected under live fluoroscopy. No contrast complications. See chart for type and volume of contrast used. Fluoroscopic Guidance: I was personally present during the use of fluoroscopy. "Tunnel Vision Technique" used to obtain the best possible view of the target area. Parallax error corrected before commencing the procedure. "Direction-depth-direction" technique used to introduce the needle under continuous pulsed fluoroscopy. Once target was reached, antero-posterior, oblique, and lateral fluoroscopic projection used confirm needle placement in all planes. Images permanently stored in EMR. Interpretation: I personally interpreted the imaging intraoperatively. Adequate needle placement confirmed in multiple planes. Appropriate spread of contrast into desired area was observed. No evidence of afferent or efferent intravascular uptake. No intrathecal or subarachnoid spread observed. Permanent images saved into the patient's record.  Antibiotic Prophylaxis:   Anti-infectives (From admission, onward)   None     Indication(s): None identified  Post-operative Assessment:  Post-procedure Vital Signs:  Pulse/HCG Rate: 7073 Temp: (!) 97.3 F (36.3 C) Resp: 18 BP: (!) 180/78 SpO2:  98 %  EBL: None  Complications: No immediate post-treatment complications observed by team, or reported by patient.  Note: The patient tolerated the entire procedure well. A repeat set of vitals were taken after the procedure and the patient was kept under observation following institutional policy, for this type of procedure. Post-procedural neurological assessment was performed, showing return to baseline, prior to discharge. The patient was provided with post-procedure discharge instructions, including a section on how to identify potential problems. Should any problems arise concerning this procedure, the patient was given instructions to immediately contact us, at any time, without hesitation. In any case, we plan to contact the patient by telephone for a follow-up status report regarding this interventional procedure.  Comments:  No additional relevant information.  Plan of Care  Orders:  Orders Placed This Encounter  Procedures  . Lumbar Transforaminal Epidural    Scheduling Instructions:     Side: Right-sided     Level: L1 & L2     Sedation: Patient's choice.     Timeframe: Today    Order Specific Question:   Where will this procedure be performed?    Answer:   [facility]  . Lumbar Epidural Injection    Scheduling Instructions:     Procedure: Interlaminar LESI L4-5     Laterality: Left-sided     Sedation: Patient's choice     Timeframe:  Today    Order Specific Question:   Where will this procedure be performed?    Answer:   [facility]  . DG PAIN [facility] C-ARM 1-60 MIN NO REPORT    Intraoperative interpretation by procedural physician at [place].    Standing Status:   Standing    Number of Occurrences:   1    Order Specific Question:   Reason for exam:    Answer:   Assistance in needle guidance and placement for procedures requiring needle placement in or near specific anatomical locations not easily accessible without such assistance.  .  Informed Consent Details: Physician/Practitioner Attestation; Transcribe to consent form and obtain patient signature    Provider Attestation: I, Bruce Dossie Arbour, MD, (Pain Management Specialist), the physician/practitioner, attest that I  have discussed with the patient the benefits, risks, side effects, alternatives, likelihood of achieving goals and potential problems during recovery for the procedure that I have provided informed consent.    Scheduling Instructions:     Note: Always confirm laterality of pain with Ms. Tait, before procedure.     Transcribe to consent form and obtain patient signature.    Order Specific Question:   Physician/Practitioner attestation of informed consent for procedure/surgical case    Answer:   I, the physician/practitioner, attest that I have discussed with the patient the benefits, risks, side effects, alternatives, likelihood of achieving goals and potential problems during recovery for the procedure that I have provided informed consent.    Order Specific Question:   Procedure    Answer:   Diagnostic lumbar transforaminal epidural steroid injection under fluoroscopic guidance. (See notes for level and laterality.)    Order Specific Question:   Physician/Practitioner performing the procedure    Answer:   Malaika Arnall A. Dossie Arbour, MD    Order Specific Question:   Indication/Reason    Answer:   Lumbar radiculopathy/radiculitis associated with lumbar stenosis  . Care order/instruction: Please confirm that the patient has stopped the Eliquis (Apixaban) x 3 days prior to procedure or surgery.    Please confirm that the patient has stopped the Eliquis (Apixaban) x 3 days prior to procedure or surgery.    Standing Status:   Standing    Number of Occurrences:   1  . Provide equipment / supplies at bedside    "Block Tray" (Disposable  single use) Needle type: SpinalSpinal Amount/quantity: 2 Size: Medium (5-inch) Gauge: 22G    Standing Status:   Standing     Number of Occurrences:   1    Order Specific Question:   Specify    Answer:   Block Tray  . Informed Consent Details: Physician/Practitioner Attestation; Transcribe to consent form and obtain patient signature    Note: Always confirm laterality of pain with Ms. Huls, before procedure. Transcribe to consent form and obtain patient signature.    Order Specific Question:   Physician/Practitioner attestation of informed consent for procedure/surgical case    Answer:   I, the physician/practitioner, attest that I have discussed with the patient the benefits, risks, side effects, alternatives, likelihood of achieving goals and potential problems during recovery for the procedure that I have provided informed consent.    Order Specific Question:   Procedure    Answer:   Lumbar epidural steroid injection under fluoroscopic guidance    Order Specific Question:   Physician/Practitioner performing the procedure    Answer:   Chelcey Caputo A. Dossie Arbour, MD    Order Specific Question:   Indication/Reason    Answer:   Low back and/or lower extremity pain secondary to lumbar radiculitis  . Provide equipment / supplies at bedside    "Epidural Tray" (Disposable  single use) Catheter: NOT required    Standing Status:   Standing    Number of Occurrences:   1    Order Specific Question:   Specify    Answer:   Epidural Tray  . Bleeding precautions    Standing Status:   Standing    Number of Occurrences:   1   Chronic Opioid Analgesic:  Hydrocodone/APAP 5/325 1 tablet p.o. twice daily (10 mg/day of hydrocodone)  MME/day: 10 mg/day   Medications ordered for procedure: Meds ordered this encounter  Medications  . iohexol (OMNIPAQUE) 180 MG/ML injection 10 mL    Must be Myelogram-compatible. If  not available, you may substitute with a water-soluble, non-ionic, hypoallergenic, myelogram-compatible radiological contrast medium.  Marland Kitchen lidocaine (XYLOCAINE) 2 % (with pres) injection 400 mg  . lactated ringers infusion  1,000 mL  . midazolam (VERSED) 5 MG/5ML injection 1-2 mg    Make sure Flumazenil is available in the pyxis when using this medication. If oversedation occurs, administer 0.2 mg IV over 15 sec. If after 45 sec no response, administer 0.2 mg again over 1 min; may repeat at 1 min intervals; not to exceed 4 doses (1 mg)  . fentaNYL (SUBLIMAZE) injection 25-50 mcg    Make sure Narcan is available in the pyxis when using this medication. In the event of respiratory depression (RR< 8/min): Titrate NARCAN (naloxone) in increments of 0.1 to 0.2 mg IV at 2-3 minute intervals, until desired degree of reversal.  . sodium chloride flush (NS) 0.9 % injection 2 mL  . ropivacaine (PF) 2 mg/mL (0.2%) (NAROPIN) injection 2 mL  . DISCONTD: dexamethasone (DECADRON) injection 20 mg  . sodium chloride flush (NS) 0.9 % injection 2 mL  . ropivacaine (PF) 2 mg/mL (0.2%) (NAROPIN) injection 2 mL  . triamcinolone acetonide (KENALOG-40) injection 40 mg  . dexamethasone (DECADRON) injection 10 mg   Medications administered: We administered iohexol, lidocaine, lactated ringers, midazolam, fentaNYL, sodium chloride flush, ropivacaine (PF) 2 mg/mL (0.2%), sodium chloride flush, ropivacaine (PF) 2 mg/mL (0.2%), triamcinolone acetonide, and dexamethasone.  See the medical record for exact dosing, route, and time of administration.  Follow-up plan:   Return in about 2 weeks (around 10/11/2020) for (F2F), (PP) Follow-up.       Interventional Therapies  Risk  Complexity Considerations:   ELIQUIS Anticoagulation (Stop: 3 days  Restart: 6 hrs) Prior patient of [facility] (Dr. Alba Destine)   Planned  Pending:   Pending further evaluation   Under consideration:      Completed:   Diagnostic/therapeutic right L1 TFESI #1 (09/27/2020) Diagnostic/therapeutic right L2 TFESI #1 (09/27/2020)  Diagnostic/therapeutic left L4-5 LESI #1 (09/27/2020)   Completed at [facility] by Girtha Hake, MD (PMR): Right L5  TFESI #1 (05/05/2020) - 7 days of pain relief. Bilateral L5 TFESI #2 (06/09/2020) - 24 to 48 hours of pain relief.   Therapeutic  Palliative (PRN) options:   None established    Recent Visits Date Type Provider Dept  09/19/20 Office Visit Milinda Pointer, MD [facility]  09/07/20 Office Visit Milinda Pointer, MD [facility]  Showing recent visits within past 90 days and meeting all other requirements Today's Visits Date Type Provider Dept  09/27/20 Procedure visit Milinda Pointer, MD [facility]  Showing today's visits and meeting all other requirements Future Appointments Date Type Provider Dept  10/10/20 Appointment Milinda Pointer, MD [facility]  Showing future appointments within next 90 days and meeting all other requirements  Disposition: Discharge home  Discharge (Date  Time): 09/27/2020; 1349 hrs.   Primary Care Physician: Lavera Guise, MD Location: [facility] Note by: Gaspar Cola, MD Date: 09/27/2020; Time: 2:01 PM  Disclaimer:  Medicine is not an Chief Strategy Officer. The only guarantee in medicine is that nothing is guaranteed. It is important to note that the decision to proceed with this intervention was based on the information collected from the patient. The Data and conclusions were drawn from the patient's questionnaire, the interview, and the physical examination. Because the information was provided in large part by the patient, it cannot be guaranteed that it has  not been purposely or unconsciously manipulated. Every effort has been made to obtain as much relevant data as possible for this evaluation. It is important to note that the conclusions that lead to this procedure are derived in large part from the available data. Always take into account that the treatment will also be dependent on availability of resources and existing treatment guidelines, considered by other Pain Management  Practitioners as being common knowledge and practice, at the time of the intervention. For Medico-Legal purposes, it is also important to point out that variation in procedural techniques and pharmacological choices are the acceptable norm. The indications, contraindications, technique, and results of the above procedure should only be interpreted and judged by a Board-Certified Interventional Pain Specialist with extensive familiarity and expertise in the same exact procedure and technique.

## 2020-09-27 ENCOUNTER — Ambulatory Visit
Admission: RE | Admit: 2020-09-27 | Discharge: 2020-09-27 | Disposition: A | Discharge status: Home / Self Care | Payer: Medicare Other | Source: Ambulatory Visit | Attending: Pain Medicine | Admitting: Pain Medicine

## 2020-09-27 ENCOUNTER — Ambulatory Visit (HOSPITAL_BASED_OUTPATIENT_CLINIC_OR_DEPARTMENT_OTHER): Payer: Medicare Other | Admitting: Pain Medicine

## 2020-09-27 ENCOUNTER — Other Ambulatory Visit: Payer: Self-pay

## 2020-09-27 ENCOUNTER — Encounter: Payer: Self-pay | Admitting: Pain Medicine

## 2020-09-27 VITALS — BP 180/78 | HR 70 | Temp 97.3°F | Resp 18 | Ht 64.0 in | Wt 211.0 lb

## 2020-09-27 DIAGNOSIS — Z7901 Long term (current) use of anticoagulants: Secondary | ICD-10-CM | POA: Insufficient documentation

## 2020-09-27 DIAGNOSIS — M5416 Radiculopathy, lumbar region: Secondary | ICD-10-CM | POA: Diagnosis not present

## 2020-09-27 DIAGNOSIS — M48061 Spinal stenosis, lumbar region without neurogenic claudication: Secondary | ICD-10-CM

## 2020-09-27 DIAGNOSIS — M4310 Spondylolisthesis, site unspecified: Secondary | ICD-10-CM | POA: Insufficient documentation

## 2020-09-27 DIAGNOSIS — M5441 Lumbago with sciatica, right side: Secondary | ICD-10-CM | POA: Insufficient documentation

## 2020-09-27 DIAGNOSIS — G8929 Other chronic pain: Secondary | ICD-10-CM | POA: Diagnosis not present

## 2020-09-27 DIAGNOSIS — M5417 Radiculopathy, lumbosacral region: Secondary | ICD-10-CM | POA: Diagnosis not present

## 2020-09-27 DIAGNOSIS — M5137 Other intervertebral disc degeneration, lumbosacral region: Secondary | ICD-10-CM | POA: Insufficient documentation

## 2020-09-27 DIAGNOSIS — S32010S Wedge compression fracture of first lumbar vertebra, sequela: Secondary | ICD-10-CM

## 2020-09-27 DIAGNOSIS — M48062 Spinal stenosis, lumbar region with neurogenic claudication: Secondary | ICD-10-CM | POA: Insufficient documentation

## 2020-09-27 DIAGNOSIS — S32050S Wedge compression fracture of fifth lumbar vertebra, sequela: Secondary | ICD-10-CM | POA: Diagnosis not present

## 2020-09-27 DIAGNOSIS — M4316 Spondylolisthesis, lumbar region: Secondary | ICD-10-CM

## 2020-09-27 DIAGNOSIS — M79604 Pain in right leg: Secondary | ICD-10-CM | POA: Diagnosis not present

## 2020-09-27 MED ORDER — IOHEXOL 180 MG/ML  SOLN
10.0000 mL | Freq: Once | INTRAMUSCULAR | Status: AC
  Administered 2020-09-27: 10 mL via EPIDURAL
  Filled 2020-09-27: qty 20

## 2020-09-27 MED ORDER — LACTATED RINGERS IV SOLN
1000.0000 mL | Freq: Once | INTRAVENOUS | Status: AC
  Administered 2020-09-27: 1000 mL via INTRAVENOUS

## 2020-09-27 MED ORDER — DEXAMETHASONE SODIUM PHOSPHATE 10 MG/ML IJ SOLN
10.0000 mg | Freq: Once | INTRAMUSCULAR | Status: AC
  Administered 2020-09-27: 10 mg
  Filled 2020-09-27: qty 1

## 2020-09-27 MED ORDER — FENTANYL CITRATE (PF) 100 MCG/2ML IJ SOLN
25.0000 ug | INTRAMUSCULAR | Status: DC | PRN
  Administered 2020-09-27: 50 ug via INTRAVENOUS
  Filled 2020-09-27: qty 2

## 2020-09-27 MED ORDER — SODIUM CHLORIDE 0.9% FLUSH
2.0000 mL | Freq: Once | INTRAVENOUS | Status: AC
  Administered 2020-09-27: 2 mL

## 2020-09-27 MED ORDER — MIDAZOLAM HCL 5 MG/5ML IJ SOLN
1.0000 mg | INTRAMUSCULAR | Status: DC | PRN
  Administered 2020-09-27: 1 mg via INTRAVENOUS
  Filled 2020-09-27: qty 5

## 2020-09-27 MED ORDER — ROPIVACAINE HCL 2 MG/ML IJ SOLN
2.0000 mL | Freq: Once | INTRAMUSCULAR | Status: AC
  Administered 2020-09-27: 2 mL via EPIDURAL
  Filled 2020-09-27: qty 10

## 2020-09-27 MED ORDER — TRIAMCINOLONE ACETONIDE 40 MG/ML IJ SUSP
40.0000 mg | Freq: Once | INTRAMUSCULAR | Status: AC
  Administered 2020-09-27: 40 mg
  Filled 2020-09-27: qty 1

## 2020-09-27 MED ORDER — LIDOCAINE HCL (PF) 2 % IJ SOLN
INTRAMUSCULAR | Status: AC
  Filled 2020-09-27: qty 20

## 2020-09-27 MED ORDER — DEXAMETHASONE SODIUM PHOSPHATE 10 MG/ML IJ SOLN: 20.0000 mg | Freq: Once | INTRAMUSCULAR | Status: DC

## 2020-09-27 MED ORDER — LIDOCAINE HCL 2 % IJ SOLN
20.0000 mL | Freq: Once | INTRAMUSCULAR | Status: AC
  Administered 2020-09-27: 200 mg
  Filled 2020-09-27: qty 20

## 2020-09-27 NOTE — Progress Notes (Signed)
Safety precautions to be maintained throughout the outpatient stay will include: orient to surroundings, keep bed in low position, maintain call bell within reach at all times, provide assistance with transfer out of bed and ambulation.  

## 2020-09-27 NOTE — Patient Instructions (Addendum)
____________________________________________________________________________________________  Post-Procedure Discharge Instructions  Instructions:  Apply ice:   Purpose: This will minimize any swelling and discomfort after procedure.   When: Day of procedure, as soon as you get home.  How: Fill a plastic sandwich bag with crushed ice. Cover it with a small towel and apply to injection site.  How long: (15 min on, 15 min off) Apply for 15 minutes then remove x 15 minutes.  Repeat sequence on day of procedure, until you go to bed.  Apply heat:   Purpose: To treat any soreness and discomfort from the procedure.  When: Starting the next day after the procedure.  How: Apply heat to procedure site starting the day following the procedure.  How long: May continue to repeat daily, until discomfort goes away.  Food intake: Start with clear liquids (like water) and advance to regular food, as tolerated.   Physical activities: Keep activities to a minimum for the first 8 hours after the procedure. After that, then as tolerated.  Driving: If you have received any sedation, be responsible and do not drive. You are not allowed to drive for 24 hours after having sedation.  Blood thinner: (Applies only to those taking blood thinners) You may restart your blood thinner 6 hours after your procedure.  Insulin: (Applies only to Diabetic patients taking insulin) As soon as you can eat, you may resume your normal dosing schedule.  Infection prevention: Keep procedure site clean and dry. Shower daily and clean area with soap and water.  Post-procedure Pain Diary: Extremely important that this be done correctly and accurately. Recorded information will be used to determine the next step in treatment. For the purpose of accuracy, follow these rules:  Evaluate only the area treated. Do not report or include pain from an untreated area. For the purpose of this evaluation, ignore all other areas of pain,  except for the treated area.  After your procedure, avoid taking a long nap and attempting to complete the pain diary after you wake up. Instead, set your alarm clock to go off every hour, on the hour, for the initial 8 hours after the procedure. Document the duration of the numbing medicine, and the relief you are getting from it.  Do not go to sleep and attempt to complete it later. It will not be accurate. If you received sedation, it is likely that you were given a medication that may cause amnesia. Because of this, completing the diary at a later time may cause the information to be inaccurate. This information is needed to plan your care.  Follow-up appointment: Keep your post-procedure follow-up evaluation appointment after the procedure (usually 2 weeks for most procedures, 6 weeks for radiofrequencies). DO NOT FORGET to bring you pain diary with you.   Expect: (What should I expect to see with my procedure?)  From numbing medicine (AKA: Local Anesthetics): Numbness or decrease in pain. You may also experience some weakness, which if present, could last for the duration of the local anesthetic.  Onset: Full effect within 15 minutes of injected.  Duration: It will depend on the type of local anesthetic used. On the average, 1 to 8 hours.   From steroids (Applies only if steroids were used): Decrease in swelling or inflammation. Once inflammation is improved, relief of the pain will follow.  Onset of benefits: Depends on the amount of swelling present. The more swelling, the longer it will take for the benefits to be seen. In some cases, up to 10 days.    Duration: Steroids will stay in the system x 2 weeks. Duration of benefits will depend on multiple posibilities including persistent irritating factors.  Side-effects: If present, they may typically last 2 weeks (the duration of the steroids).  Frequent: Cramps (if they occur, drink Gatorade and take over-the-counter Magnesium 450-500 mg  once to twice a day); water retention with temporary weight gain; increases in blood sugar; decreased immune system response; increased appetite.  Occasional: Facial flushing (red, warm cheeks); mood swings; menstrual changes.  Uncommon: Long-term decrease or suppression of natural hormones; bone thinning. (These are more common with higher doses or more frequent use. This is why we prefer that our patients avoid having any injection therapies in other practices.)   Very Rare: Severe mood changes; psychosis; aseptic necrosis.  From procedure: Some discomfort is to be expected once the numbing medicine wears off. This should be minimal if ice and heat are applied as instructed.  Call if: (When should I call?)  You experience numbness and weakness that gets worse with time, as opposed to wearing off.  New onset bowel or bladder incontinence. (Applies only to procedures done in the spine)  Emergency Numbers:  Durning business hours (Monday - Thursday, 8:00 AM - 4:00 PM) (Friday, 9:00 AM - 12:00 Noon): [phone]  After hours: [phone]  NOTE: If you are having a problem and are unable connect with, or to talk to a provider, then go to your nearest urgent care or emergency department. If the problem is serious and urgent, please call 911. ____________________________________________________________________________________________   ____________________________________________________________________________________________  Medication Evaluation (Hydrocodone/APAP 5/325, 1 tab PO BID)  Purpose: The purpose of these questions is to establish the onset of effects (speed of absorption), peak benefit (effectiveness), side-effects (ability to tolerate), and duration (excretion/metabolism) of the prescribed medication. The results will help the healthcare provider decide what to do with the medication in question.  Please indicate:  1. Time to onset of benefits: The amount of time it  takes for you to begin perceiving any benefits after taking (swallowing) your pain medicine. _______ minutes.  2. Time to peak effect: The time it takes between taking medicine (swallowing it) and the moment when you feel the most benefit (peak effect) from the medicine. _______ minutes.  3. Peak benefit: Please quantify the amount of relief you obtain from the medicine at the moment it is working the best. Does you pain completely go away (100% gone)? Is it three quarters (3/4) better (75% relief)? Does half of your pain go away (50% benefit)? Is it a third better (33% improved)? Or does it go down only by a fourth (25% better)? _______ % relief.  4. Duration of benefit: The time it takes for all benefits of the medicine to be gone. This period of time starts when you first swallow your pain pill and ends when you feel that your pain has increased to the point where you absolutely need to take another pill. _______ hours and _______ minutes.  5. Adverse reactions: These are side effects and/or adverse reactions you experience since taking the medicine or only when taking your pain medicine. Please circle any and all that apply:  a. Allergic reactions: itching; hives; generalized redness; swelling of the tongue; swelling of the eyes; difficulty breathing. b. Neurological Intolerance: cognitive impairment (difficulty thinking clearly; memory problems; difficulty remembering things; slurred speech); oversedation; sleepiness: unsteadiness; difficulty walking. c. Gastrointestinal problems: constipation; nausea; vomiting; dry heaves; decreased appetite. d. Hormonal problems: decreased sex drive; erection problems; abnormal  menstrual period; weight gain or inability to lose weight; weakness or low energy; hair loss.  What to do: When completed, please bring back to your next visit. Please give it to the admitting nurse.  (Last updated:  01/01/2019) ____________________________________________________________________________________________    ______________________________________________________________________________________________  Specialty Pain Scale  Introduction:  There are significant differences in how pain is reported. The word pain usually refers to physical pain, but it is also a common synonym of suffering. The medical community uses a scale from 0 (zero) to 10 (ten) to report pain level. Zero (0) is described as "no pain", while ten (10) is described as "the worse pain you can imagine". The problem with this scale is that physical pain is reported along with suffering. Suffering refers to mental pain, or more often yet it refers to any unpleasant feeling, emotion or aversion associated with the perception of harm or threat of harm. It is the psychological component of pain.  Pain Specialists prefer to separate the two components. The pain scale used by this practice is the Verbal Numerical Rating Scale (VNRS-11). This scale is for the physical pain only. DO NOT INCLUDE how your pain psychologically affects you. This scale is for adults 34 years of age and older. It has 11 (eleven) levels. The 1st level is 0/10. This means: "right now, I have no pain". In the context of pain management, it also means: "right now, my physical pain is under control with the current therapy".  General Information:  The scale should reflect your current level of pain. Unless you are specifically asked for the level of your worst pain, or your average pain. If you are asked for one of these two, then it should be understood that it is over the past 24 hours.  Levels 1 (one) through 5 (five) are described below, and can be treated as an outpatient. Ambulatory pain management facilities such as ours are more than adequate to treat these levels. Levels 6 (six) through 10 (ten) are also described below, however, these must be treated as a  hospitalized patient. While levels 6 (six) and 7 (seven) may be evaluated at an urgent care facility, levels 8 (eight) through 10 (ten) constitute medical emergencies and as such, they belong in a hospital's emergency department. When having these levels (as described below), do not come to our office. Our facility is not equipped to manage these levels. Go directly to an urgent care facility or an emergency department to be evaluated.  Definitions:  Activities of Daily Living (ADL): Activities of daily living (ADL or ADLs) is a term used in healthcare to refer to people's daily self-care activities. Health professionals often use a person's ability or inability to perform ADLs as a measurement of their functional status, particularly in regard to people post injury, with disabilities and the elderly. There are two ADL levels: Basic and Instrumental. Basic Activities of Daily Living (BADL  or BADLs) consist of self-care tasks that include: Bathing and showering; personal hygiene and grooming (including brushing/combing/styling hair); dressing; Toilet hygiene (getting to the toilet, cleaning oneself, and getting back up); eating and self-feeding (not including cooking or chewing and swallowing); functional mobility, often referred to as "transferring", as measured by the ability to walk, get in and out of bed, and get into and out of a chair; the broader definition (moving from one place to another while performing activities) is useful for people with different physical abilities who are still able to get around independently. Basic ADLs include the  things many people do when they get up in the morning and get ready to go out of the house: get out of bed, go to the toilet, bathe, dress, groom, and eat. On the average, loss of function typically follows a particular order. Hygiene is the first to go, followed by loss of toilet use and locomotion. The last to go is the ability to eat. When there is only one  remaining area in which the person is independent, there is a 62.9% chance that it is eating and only a 3.5% chance that it is hygiene. Instrumental Activities of Daily Living (IADL or IADLs) are not necessary for fundamental functioning, but they let an individual live independently in a community. IADL consist of tasks that include: cleaning and maintaining the house; home establishment and maintenance; care of others (including selecting and supervising caregivers); care of pets; child rearing; managing money; managing financials (investments, etc.); meal preparation and cleanup; shopping for groceries and necessities; moving within the community; safety procedures and emergency responses; health management and maintenance (taking prescribed medications); and using the telephone or other form of communication.  Instructions:  Most patients tend to report their pain as a combination of two factors, their physical pain and their psychosocial pain. This last one is also known as "suffering" and it is reflection of how physical pain affects you socially and psychologically. From now on, report them separately.  From this point on, when asked to report your pain level, report only your physical pain. Use the following table for reference.  Pain Clinic Pain Levels (0-5/10)  Pain Level Score  Description  No Pain 0   Mild pain 1 Nagging, annoying, but does not interfere with basic activities of daily living (ADL). Patients are able to eat, bathe, get dressed, toileting (being able to get on and off the toilet and perform personal hygiene functions), transfer (move in and out of bed or a chair without assistance), and maintain continence (able to control bladder and bowel functions). Blood pressure and heart rate are unaffected. A normal heart rate for a healthy adult ranges from 60 to 100 bpm (beats per minute).   Mild to moderate pain 2 Noticeable and distracting. Impossible to hide from other people. More  frequent flare-ups. Still possible to adapt and function close to normal. It can be very annoying and may have occasional stronger flare-ups. With discipline, patients may get used to it and adapt.   Moderate pain 3 Interferes significantly with activities of daily living (ADL). It becomes difficult to feed, bathe, get dressed, get on and off the toilet or to perform personal hygiene functions. Difficult to get in and out of bed or a chair without assistance. Very distracting. With effort, it can be ignored when deeply involved in activities.   Moderately severe pain 4 Impossible to ignore for more than a few minutes. With effort, patients may still be able to manage work or participate in some social activities. Very difficult to concentrate. Signs of autonomic nervous system discharge are evident: dilated pupils (mydriasis); mild sweating (diaphoresis); sleep interference. Heart rate becomes elevated (>115 bpm). Diastolic blood pressure (lower number) rises above 100 mmHg. Patients find relief in laying down and not moving.   Severe pain 5 Intense and extremely unpleasant. Associated with frowning face and frequent crying. Pain overwhelms the senses.  Ability to do any activity or maintain social relationships becomes significantly limited. Conversation becomes difficult. Pacing back and forth is common, as getting into a comfortable position is  nearly impossible. Pain wakes you up from deep sleep. Physical signs will be obvious: pupillary dilation; increased sweating; goosebumps; brisk reflexes; cold, clammy hands and feet; nausea, vomiting or dry heaves; loss of appetite; significant sleep disturbance with inability to fall asleep or to remain asleep. When persistent, significant weight loss is observed due to the complete loss of appetite and sleep deprivation.  Blood pressure and heart rate becomes significantly elevated. Caution: If elevated blood pressure triggers a pounding headache associated with  blurred vision, then the patient should immediately seek attention at an urgent or emergency care unit, as these may be signs of an impending stroke.    Emergency Department Pain Levels (6-10/10)  Emergency Room Pain 6 Severely limiting. Requires emergency care and should not be seen or managed at an outpatient pain management facility. Communication becomes difficult and requires great effort. Assistance to reach the emergency department may be required. Facial flushing and profuse sweating along with potentially dangerous increases in heart rate and blood pressure will be evident.   Distressing pain 7 Self-care is very difficult. Assistance is required to transport, or use restroom. Assistance to reach the emergency department will be required. Tasks requiring coordination, such as bathing and getting dressed become very difficult.   Disabling pain 8 Self-care is no longer possible. At this level, pain is disabling. The individual is unable to do even the most "basic" activities such as walking, eating, bathing, dressing, transferring to a bed, or toileting. Fine motor skills are lost. It is difficult to think clearly.   Incapacitating pain 9 Pain becomes incapacitating. Thought processing is no longer possible. Difficult to remember your own name. Control of movement and coordination are lost.   The worst pain imaginable 10 At this level, most patients pass out from pain. When this level is reached, collapse of the autonomic nervous system occurs, leading to a sudden drop in blood pressure and heart rate. This in turn results in a temporary and dramatic drop in blood flow to the brain, leading to a loss of consciousness. Fainting is one of the body's self defense mechanisms. Passing out puts the brain in a calmed state and causes it to shut down for a while, in order to begin the healing process.    Summary: 1.   Refer to this scale when providing [place] with your pain level. 2.   Be accurate and  careful when reporting your pain level. This will help with your care. 3.   Over-reporting your pain level will lead to loss of credibility. 4.   Even a level of 1/10 means that there is pain and will be treated at our facility. 5.   High, inaccurate reporting will be documented as "Symptom Exaggeration", leading to loss of credibility and suspicions of possible secondary gains such as obtaining more narcotics, or wanting to appear disabled, for fraudulent reasons. 6.   Only pain levels of 5 or below will be seen at our facility. 7.   Pain levels of 6 and above will be sent to the Emergency Department and the appointment cancelled.  ______________________________________________________________________________________________

## 2020-09-28 ENCOUNTER — Other Ambulatory Visit: Payer: Self-pay

## 2020-09-28 ENCOUNTER — Telehealth: Payer: Self-pay | Admitting: *Deleted

## 2020-09-28 DIAGNOSIS — F411 Generalized anxiety disorder: Secondary | ICD-10-CM

## 2020-09-28 DIAGNOSIS — I10 Essential (primary) hypertension: Secondary | ICD-10-CM

## 2020-09-28 MED ORDER — VENLAFAXINE HCL ER 75 MG PO CP24: ORAL_CAPSULE | ORAL | 0 refills | Status: DC

## 2020-09-28 MED ORDER — METOPROLOL SUCCINATE ER 50 MG PO TB24: 100.0000 mg | ORAL_TABLET | Freq: Every day | ORAL | 0 refills | Status: DC

## 2020-09-28 NOTE — Telephone Encounter (Signed)
Left voicemail re; procedure on yesterday, to call  if there are any questions or concerns.

## 2020-10-09 NOTE — Progress Notes (Signed)
PROVIDER NOTE: Information contained herein reflects review and annotations entered in association with encounter. Interpretation of such information and data should be left to medically-trained personnel. Information provided to patient can be located elsewhere in the medical record under "Patient Instructions". Document created using STT-dictation technology, any transcriptional errors that may result from process are unintentional.    Patient: [name] Douglas  Service Category: E/M  Provider: Gaspar Cola, MD  DOB: 1940/12/23  DOS: 10/10/2020  Specialty: Interventional Pain Management  MRN: [id number]  Setting: Ambulatory outpatient  PCP: Lavera Guise, MD  Type: Established Patient    Referring Provider: Lavera Guise, MD  Location: Office  Delivery: Face-to-face     HPI  Ms. JACKLINE CASTILLA, a 80 y.o. year old female, is here today because of her Chronic pain syndrome [G89.4]. Ms. Dodge primary complain today is Back Pain (Left side) Last encounter: My last encounter with her was on 09/27/2020. Pertinent problems: Ms. Kinkade has Primary localized osteoarthritis of right knee; Intractable episodic cluster headache; Other symptoms and signs involving the nervous system; Muscle spasm; Headache disorder; Intractable low back pain; Fall down stairs; Lumbar central spinal stenosis (Moderate: L2-3  Severe: L3-4, L4-5) w/ neurogenic claudication; Chronic pain syndrome; Chronic low back pain (2ry area of Pain) (Bilateral) (R>L) w/ sciatica (Right); Chronic lower extremity pain (1ry area of Pain) (Right); Chronic lumbar radiculopathy (Right); Lumbosacral radiculopathy at L2 (Right); Lumbar radiculitis (L2 dermatome) (Right); Unspecified inflammatory spondylopathy, lumbar region [facility]); Abnormal MRI, lumbar spine (04/20/2020); Lumbar foraminal stenosis (Bilateral: L2-3, L3-4, L4-5) (Right: L5-S1) (Severe left L4-5); Grade 1 Lumbar Anterolisthesis (L3/4 and L4/5); Grade 1 Lumbar Retrolisthesis (L2/3);  Protrusion of lumbar intervertebral disc (Multilevel); Compression fracture of L1 lumbar vertebra, sequela; Other intervertebral disc degeneration, lumbar region; Lumbosacral facet syndrome (Multilevel) (Bilateral); Lumbar facet hypertrophy (Multilevel) (Bilateral); Ligamentum flavum hypertrophy; Lumbar lateral recess stenosis (Right: L2-3); DDD (degenerative disc disease), lumbosacral; Compression fracture of L5 vertebra ([place]); Osteoporosis with pathological fracture of lumbar vertebra ([place]); Chronic thoracic back pain (Left); and T12 compression fracture, sequela on their pertinent problem list. Pain Assessment: Severity of Chronic pain is reported as a 7 /10. Location: Back Lower,Left/Denies. Onset: More than a month ago. Quality: Aching,Burning,Constant,Throbbing,Sharp,Shooting. Timing: Constant. Modifying factor(s): procedures, meds. Vitals:  height is 5' 4.5" (1.638 m) and weight is 211 lb (95.7 kg). Her temporal temperature is 97.1 F (36.2 C) (abnormal). Her blood pressure is 131/63 and her pulse is 86. Her respiration is 16 and oxygen saturation is 99%.   Reason for encounter: post-procedure assessment.  The patient returns today after having had a right L1 and L2 transforaminal ESI + left L4-5 LESI.  According to the patient this provided her with 100% relief of the pain that has lasted for 10 days.  During that time she was not experiencing any lower extremity or back pain.  However, after that she began to again experience pain in the left mid thoracic region.  She denies any symptoms in the lower extremities.  Therefore, the treatment has completely eliminated her lower extremity pain and radiculitis.  However, she does have an L1 compression fracture and the CT of the lumbar spine also had revealed a T12 wedge compression.  Both of them were classified by the CT has been chronic.  However, in view of this pain that she is experiencing at this time which she refers to it as having worsened from  what she had before, there is the possibility that she may be having another fracture  in the thoracolumbar region.  Therefore today I will be ordering some x-rays of that area to check for any known fractures.  If she happens to have a no fracture, then we may consider a referral to neurosurgery for kyphoplasty.  If she is having progression of the L1 fracture, we will also consult neurosurgery for possible kyphoplasty to stabilize the fracture.  In any case, I will tentatively schedule her to return for a left-sided lower thoracic epidural steroid injection.  The exact level will depend on the results of the x-rays ordered.  Today the patient came into the clinics with her son-in-law.  He was present for the entire explanation of what is going on and I took the opportunity to counsel the patient on her smoking and why she should stop, based on the problems that she has been experiencing with her spine.  I also recommended that she be on some type of therapy for her osteoporosis so as to decrease the chances of more fractures occurring.  The son-in-law showed me a picture of a note from the patient's daughter where it appeared that she was asking about the possibility of using some nasal salmon calcitonin as an option for the treatment of the osteoporosis.  I informed him that this may be a good option, but any type of osteoporosis treatments need to be managed by the primary care physician.  The final decision as to what type of medication needs to be used for the treatment of her osteoporosis will depend on multiple things and the monitoring of these medications should also be my patients PCP.  The patient's son-in-law also asked me about my opinion on the patient using Goody powders for the treatment of her pain and I told him that this was an absolute contraindication.  Since she is on blood thinners, she is at high risk of severe GI bleeding secondary to the use of any NSAIDs or aspirin products.  I warned  them to be in the look out for possible black stools and the fact that that would suggest internal upper GI bleeding.  They were informed that she should immediately stop the use of these medicines and should any of that occur, she would need to inform her PCP immediately.  I conveyed the gravity of this type of a problem by reminding her that an upper GI bleed while on a blood thinner could be life-threatening.  They understood and accepted.  Review of the PMP indicates that on 09/13/2020 the patient received a prescription for hydrocodone/APAP 5/325 #60 from Dr. Sharlet Salina.  Post-Procedure Evaluation  Procedure (09/27/2020): Diagnostic/therapeutic right L1 and L2 TFESI #1 + left L4-5 LESI #1 under fluoroscopic guidance and IV sedation Pre-procedure pain level: 10/10 Post-procedure: 6/10 (< 50% relief)  Sedation: Sedation provided.  Effectiveness during initial hour after procedure(Ultra-Short Term Relief): 100 %.  Local anesthetic used: Long-acting (4-6 hours) Effectiveness: Defined as any analgesic benefit obtained secondary to the administration of local anesthetics. This carries significant diagnostic value as to the etiological location, or anatomical origin, of the pain. Duration of benefit is expected to coincide with the duration of the local anesthetic used.  Effectiveness during initial 4-6 hours after procedure(Short-Term Relief): 100 %.  Long-term benefit: Defined as any relief past the pharmacologic duration of the local anesthetics.  Effectiveness past the initial 6 hours after procedure(Long-Term Relief): 100 % (last for 10 days).  Current benefits: Defined as benefit that persist at this time.   Analgesia:  90-100% better  with regards to the lower extremities.  With regards to her lower back, she has improved significantly, but she still having some pain on the left mid thoracic region.  This may be different from what we had set up to treat before. Function: Somewhat  improved ROM: Somewhat improved  Pharmacotherapy Assessment   Analgesic: Hydrocodone/APAP 5/325 1 tablet p.o. twice daily (10 mg/day of hydrocodone)  MME/day: 10 mg/day   Monitoring: [place] PMP: PDMP reviewed during this encounter.       Pharmacotherapy: No side-effects or adverse reactions reported. Compliance: No problems identified. Effectiveness: Clinically acceptable.  Chauncey Fischer, RN  10/10/2020 12:01 PM  Sign when Signing Visit Safety precautions to be maintained throughout the outpatient stay will include: orient to surroundings, keep bed in low position, maintain call bell within reach at all times, provide assistance  with transfer out of bed and ambulation.     UDS:  Summary  Date Value Ref Range Status  09/19/2020 Note  Final    Comment:    ==================================================================== Compliance Drug Analysis, Ur ==================================================================== Test                             Result       Flag       Units  Drug Present and Declared for Prescription Verification   Hydrocodone                    642          EXPECTED   ng/mg creat   Hydromorphone                  189          EXPECTED   ng/mg creat   Dihydrocodeine                 108          EXPECTED   ng/mg creat   Norhydrocodone                 971          EXPECTED   ng/mg creat    Sources of hydrocodone include scheduled prescription medications.    Hydromorphone, dihydrocodeine and norhydrocodone are expected    metabolites of hydrocodone. Hydromorphone and dihydrocodeine are    also available as scheduled prescription medications.    Gabapentin                     PRESENT      EXPECTED   Venlafaxine                    PRESENT      EXPECTED   Desmethylvenlafaxine           PRESENT      EXPECTED    Desmethylvenlafaxine is an expected metabolite of venlafaxine.    Acetaminophen                  PRESENT      EXPECTED   Salicylate                      PRESENT      EXPECTED   Metoprolol                     PRESENT      EXPECTED  Drug Present not Declared for Prescription Verification  Lidocaine                      PRESENT      UNEXPECTED  Drug Absent but Declared for Prescription Verification   Cyclobenzaprine                Not Detected UNEXPECTED ==================================================================== Test                      Result    Flag   Units      Ref Range   Creatinine              259              mg/dL      >=20 ==================================================================== Declared Medications:  The flagging and interpretation on this report are based on the  following declared medications.  Unexpected results may arise from  inaccuracies in the declared medications.   **Note: The testing scope of this panel includes these medications:   Cyclobenzaprine (Flexeril)  Gabapentin  Hydrocodone  Metoprolol  Venlafaxine   **Note: The testing scope of this panel does not include small to  moderate amounts of these reported medications:   Acetaminophen  Aspirin   **Note: The testing scope of this panel does not include the  following reported medications:   Albuterol  Apixaban (Eliquis)  Atorvastatin  Caffeine  Docusate  Donepezil (Aricept)  Levothyroxine  Losartan  Vitamin B12  Vitamin D2 ==================================================================== For clinical consultation, please call [phone]. ====================================================================      ROS  Constitutional: Denies any fever or chills Gastrointestinal: No reported hemesis, hematochezia, vomiting, or acute GI distress Musculoskeletal: Denies any acute onset joint swelling, redness, loss of ROM, or weakness Neurological: No reported episodes of acute onset apraxia, aphasia, dysarthria, agnosia, amnesia, paralysis, loss of coordination, or loss of consciousness  Medication Review   Aspirin-Acetaminophen-Caffeine, B-12, HYDROcodone-acetaminophen, Vitamin B-12, albuterol, apixaban, atorvastatin, cyclobenzaprine, docusate sodium, donepezil, ergocalciferol, gabapentin, levothyroxine, losartan, metoprolol succinate, and venlafaxine XR  History Review  Allergy: Ms. Hawes is allergic to heparin. Drug: Ms. Groeneveld  reports no history of drug use. Alcohol:  reports no history of alcohol use. Tobacco:  reports that she has been smoking cigarettes. She has a 13.25 pack-year smoking history. She has never used smokeless tobacco. Social: Ms. Rosene  reports that she has been smoking cigarettes. She has a 13.25 pack-year smoking history. She has never used smokeless tobacco. She reports that she does not drink alcohol and does not use drugs. Medical:  has a past medical history of Anxiety, Arthritis, Asthma, Chronic lower back pain, Complication of anesthesia, COPD (chronic obstructive pulmonary disease) ([place]), DDD (degenerative disc disease), lumbar, Depression, Diverticulosis, GERD (gastroesophageal reflux disease), High cholesterol, Hypertension, Hypothyroidism, Memory loss, On home oxygen therapy, Peripheral vascular disease ([place]), Pre-diabetes, Shortness of breath dyspnea, and Stroke ([place]). Surgical: Ms. Lasky  has a past surgical history that includes Tonsillectomy (~ 1950); Excisional hemorrhoidectomy (1960's); Total knee arthroplasty (Left, 2008); Knee arthroscopy (Left, X 2 <2008); Vaginal hysterectomy (1974); Tubal ligation (1973); Cardiac catheterization (~ 2007); Shoulder open rotator cuff repair (Right, 2001); Eye surgery (Bilateral); Knee arthroscopy with subchondroplasty (Right, 04/19/2016); Total knee arthroplasty (Right, 07/03/2016); and LOOP RECORDER INSERTION (N/A, 01/02/2018). Family: family history includes Congestive Heart Failure in her father and sister; Diabetes in her mother; Hypertension in her mother; Lung cancer in her mother.  Laboratory Chemistry Profile    Renal Lab Results  Component Value Date   BUN 18  08/04/2020   CREATININE 0.85 08/04/2020   BCR 16 05/07/2019   GFRAA >60 11/06/2019   GFRNONAA >60 08/04/2020     Hepatic Lab Results  Component Value Date   AST 13 (L) 08/04/2020   ALT 10 08/04/2020   ALBUMIN 3.7 08/04/2020   ALKPHOS 52 08/04/2020   HCVAB NEGATIVE 08/24/2014   LIPASE 18 08/20/2014     Electrolytes Lab Results  Component Value Date   NA 144 08/04/2020   K 3.8 08/04/2020   CL 107 08/04/2020   CALCIUM 9.4 08/04/2020   MG 2.1 09/07/2020     Bone Lab Results  Component Value Date   VD25OH 8.6 (L) 05/07/2019   25OHVITD1 59 09/07/2020   25OHVITD2 57 09/07/2020   25OHVITD3 1.9 09/07/2020   TESTOSTERONE 16 04/29/2018     Inflammation (CRP: Acute Phase) (ESR: Chronic Phase) Lab Results  Component Value Date   CRP 3 01/04/2020   ESRSEDRATE 17 01/04/2020   LATICACIDVEN 1.7 04/23/2016       Note: Above Lab results reviewed.  Recent Imaging Review  DG PAIN CLINIC C-ARM 1-60 MIN NO REPORT Fluoro was used, but no Radiologist interpretation will be provided.  Please refer to "NOTES" tab for provider progress note. Note: Reviewed        Physical Exam  General appearance: Well nourished, well developed, and well hydrated. In no apparent acute distress Mental status: Alert, oriented x 3 (person, place, & time)       Respiratory: No evidence of acute respiratory distress Eyes: PERLA Vitals: BP 131/63 (BP Location: Left Arm, Patient Position: Sitting, Cuff Size: Large)   Pulse 86   Temp (!) 97.1 F (36.2 C) (Temporal)   Resp 16   Ht 5' 4.5" (1.638 m)   Wt 211 lb (95.7 kg)   SpO2 99%   BMI 35.66 kg/m  BMI: Estimated body mass index is 35.66 kg/m as calculated from the following:   Height as of this encounter: 5' 4.5" (1.638 m).   Weight as of this encounter: 211 lb (95.7 kg). Ideal: Ideal body weight: 55.8 kg (123 lb 2 oz) Adjusted ideal body weight: 71.8 kg (158 lb 4.4 oz)  Assessment    Status Diagnosis  Controlled Controlled Controlled 1. Chronic pain syndrome   2. DDD (degenerative disc disease), lumbosacral   3. Lumbar radiculitis (L2 dermatome) (Right)   4. Lumbosacral radiculopathy at L2 (Right)   5. Lumbar lateral recess stenosis (Right: L2-3)   6. Compression fracture of L1 lumbar vertebra, sequela   7. Grade 1 Lumbar Retrolisthesis (L2/3)   8. Lumbar central spinal stenosis (Moderate: L2-3  Severe: L3-4, L4-5) w/ neurogenic claudication   9. Compression fracture of L5 vertebra, sequela   10. Chronic lumbar radiculopathy (Right)   11. Chronic thoracic back pain (Left)   12. T12 compression fracture, sequela   13. Chronic lower extremity pain (1ry area of Pain) (Right)   14. Chronic anticoagulation (Eliquis)      Updated Problems: Problem  Chronic thoracic back pain (Left)  T12 Compression Fracture, Sequela    Plan of Care  Problem-specific:  No problem-specific Assessment & Plan notes found for this encounter.  Ms. SHAHIDAH NESBITT has a current medication list which includes the following long-term medication(s): albuterol, apixaban, atorvastatin, vitamin b-12, donepezil, levothyroxine, losartan, metoprolol succinate, and venlafaxine xr.  Pharmacotherapy (Medications Ordered): No orders of the defined types were placed in this encounter.  Orders:  Orders Placed This Encounter  Procedures  . Thoracic Epidural Injection  Standing Status:   Future    Standing Expiration Date:   11/07/2020    Scheduling Instructions:     Level: TBD     Laterality: Left-sided     Sedation: With Sedation.     Timeframe: ASAA    Order Specific Question:   Where will this procedure be performed?    Answer:   [facility]  . DG Thoracic Spine 2 View    Patient presents with axial pain with possible radicular component.  In addition to any acute findings, please report on:  1. Facet (Zygapophyseal) joint DJD (Hypertrophy, space narrowing, subchondral  sclerosis, and/or osteophyte formation) 2. DDD and/or IVDD (Loss of disc height, desiccation or "Black disc disease") 3. Pars defects 4. Spondylolisthesis, spondylosis, and/or spondyloarthropathies (include Degree/Grade of displacement in mm) 5. Vertebral body Fractures, including age (old, new/acute) 57. Modic Type Changes 7. Demineralization 8. Bone pathology 9. Central, Lateral Recess, and/or Foraminal Stenosis (include AP diameter of stenosis in mm) 10. Surgical changes (hardware type, status, and presence of fibrosis) NOTE: Please specify level(s) and laterality.    Standing Status:   Future    Standing Expiration Date:   11/07/2020    Scheduling Instructions:     Imaging must be done as soon as possible. Inform patient that order will expire within 30 days and I will not renew it.    Order Specific Question:   Reason for Exam (SYMPTOM  OR DIAGNOSIS REQUIRED)    Answer:   Upper back pain and/or thoracic spine pain.    Order Specific Question:   Preferred imaging location?    Answer:   [facility]    Order Specific Question:   Call Results- Best Contact Number?    Answer:   [phone] [facility])  . Blood Thinner Instructions to Nursing    Always make sure patient has clearance from prescribing physician to stop blood thinners for interventional therapies. If the patient requires a Lovenox-bridge therapy, make sure arrangements are made to institute it with the assistance of the PCP.    Scheduling Instructions:     Have Ms. Gravelle stop the Eliquis (Apixaban) x 3 days prior to procedure or surgery.   Follow-up plan:   Return for Procedure (w/ sedation): (L) TESI #1, (Blood Thinner Protocol).      Interventional Therapies  Risk  Complexity Considerations:   ELIQUIS Anticoagulation (Stop: 3 days  Restart: 6 hrs) Prior patient of [facility] (Dr. Alba Destine)   Planned  Pending:   Pending further evaluation   Under consideration:      Completed:    Diagnostic/therapeutic right L1 TFESI #1 (09/27/2020) Diagnostic/therapeutic right L2 TFESI #1 (09/27/2020)  Diagnostic/therapeutic left L4-5 LESI #1 (09/27/2020)   Completed at the [facility] by Girtha Hake, MD (PMR): Right L5 TFESI #1 (05/05/2020) - 7 days of pain relief. Bilateral L5 TFESI #2 (06/09/2020) - 24 to 48 hours of pain relief.   Therapeutic  Palliative (PRN) options:   None established     Recent Visits Date Type Provider Dept  09/27/20 Procedure visit Milinda Pointer, MD [facility]  09/19/20 Office Visit Milinda Pointer, MD [facility]  09/07/20 Office Visit Milinda Pointer, MD [facility]  Showing recent visits within past 90 days and meeting all other requirements Today's Visits Date Type Provider Dept  10/10/20 Office Visit Milinda Pointer, MD [facility]  Showing today's visits and meeting all other requirements Future Appointments Date Type Provider Dept  10/13/20 Appointment  Milinda Pointer, MD [facility]  Showing future appointments within next 90 days and meeting all other requirements  I discussed the assessment and treatment plan with the patient. The patient was provided an opportunity to ask questions and all were answered. The patient agreed with the plan and demonstrated an understanding of the instructions.  Patient advised to call back or seek an in-person evaluation if the symptoms or condition worsens.  Duration of encounter: 35 minutes.  Note by: Gaspar Cola, MD Date: 10/10/2020; Time: 12:07 PM

## 2020-10-10 ENCOUNTER — Encounter: Payer: Self-pay | Admitting: Pain Medicine

## 2020-10-10 ENCOUNTER — Ambulatory Visit
Admission: RE | Admit: 2020-10-10 | Discharge: 2020-10-10 | Disposition: A | Discharge status: Home / Self Care | Payer: Medicare Other | Attending: Pain Medicine | Admitting: Pain Medicine

## 2020-10-10 ENCOUNTER — Ambulatory Visit
Admission: RE | Admit: 2020-10-10 | Discharge: 2020-10-10 | Disposition: A | Discharge status: Home / Self Care | Payer: Medicare Other | Source: Ambulatory Visit | Attending: Pain Medicine | Admitting: Pain Medicine

## 2020-10-10 ENCOUNTER — Other Ambulatory Visit: Payer: Self-pay

## 2020-10-10 ENCOUNTER — Ambulatory Visit (HOSPITAL_BASED_OUTPATIENT_CLINIC_OR_DEPARTMENT_OTHER): Payer: Medicare Other | Admitting: Pain Medicine

## 2020-10-10 VITALS — BP 131/63 | HR 86 | Temp 97.1°F | Resp 16 | Ht 64.5 in | Wt 211.0 lb

## 2020-10-10 DIAGNOSIS — G8929 Other chronic pain: Secondary | ICD-10-CM | POA: Insufficient documentation

## 2020-10-10 DIAGNOSIS — M4856XA Collapsed vertebra, not elsewhere classified, lumbar region, initial encounter for fracture: Secondary | ICD-10-CM | POA: Diagnosis not present

## 2020-10-10 DIAGNOSIS — M546 Pain in thoracic spine: Secondary | ICD-10-CM | POA: Insufficient documentation

## 2020-10-10 DIAGNOSIS — S32010S Wedge compression fracture of first lumbar vertebra, sequela: Secondary | ICD-10-CM | POA: Insufficient documentation

## 2020-10-10 DIAGNOSIS — Z7901 Long term (current) use of anticoagulants: Secondary | ICD-10-CM

## 2020-10-10 DIAGNOSIS — M48062 Spinal stenosis, lumbar region with neurogenic claudication: Secondary | ICD-10-CM | POA: Insufficient documentation

## 2020-10-10 DIAGNOSIS — M48061 Spinal stenosis, lumbar region without neurogenic claudication: Secondary | ICD-10-CM

## 2020-10-10 DIAGNOSIS — M5137 Other intervertebral disc degeneration, lumbosacral region: Secondary | ICD-10-CM

## 2020-10-10 DIAGNOSIS — G894 Chronic pain syndrome: Secondary | ICD-10-CM | POA: Insufficient documentation

## 2020-10-10 DIAGNOSIS — S22080S Wedge compression fracture of T11-T12 vertebra, sequela: Secondary | ICD-10-CM

## 2020-10-10 DIAGNOSIS — M79604 Pain in right leg: Secondary | ICD-10-CM | POA: Insufficient documentation

## 2020-10-10 DIAGNOSIS — S32050S Wedge compression fracture of fifth lumbar vertebra, sequela: Secondary | ICD-10-CM

## 2020-10-10 DIAGNOSIS — I639 Cerebral infarction, unspecified: Secondary | ICD-10-CM

## 2020-10-10 DIAGNOSIS — M5417 Radiculopathy, lumbosacral region: Secondary | ICD-10-CM

## 2020-10-10 DIAGNOSIS — M4310 Spondylolisthesis, site unspecified: Secondary | ICD-10-CM

## 2020-10-10 DIAGNOSIS — M5416 Radiculopathy, lumbar region: Secondary | ICD-10-CM

## 2020-10-10 DIAGNOSIS — M51379 Other intervertebral disc degeneration, lumbosacral region without mention of lumbar back pain or lower extremity pain: Secondary | ICD-10-CM

## 2020-10-10 NOTE — Patient Instructions (Signed)
____________________________________________________________________________________________  Blood Thinners  IMPORTANT NOTICE:  If you take any of these, make sure to notify the nursing staff.  Failure to do so may result in injury.  Recommended time intervals to stop and restart blood-thinners, before & after invasive procedures  Generic Name Brand Name Stop Time. Must be stopped at least this long before procedures. After procedures, wait at least this long before re-starting.  Abciximab Reopro 15 days 2 hrs  Alteplase Activase 10 days 10 days  Anagrelide Agrylin    Apixaban Eliquis 3 days 6 hrs  Cilostazol Pletal 3 days 5 hrs  Clopidogrel Plavix 7-10 days 2 hrs  Dabigatran Pradaxa 5 days 6 hrs  Dalteparin Fragmin 24 hours 4 hrs  Dipyridamole Aggrenox 11days 2 hrs  Edoxaban Lixiana; Savaysa 3 days 2 hrs  Enoxaparin  Lovenox 24 hours 4 hrs  Eptifibatide Integrillin 8 hours 2 hrs  Fondaparinux  Arixtra 72 hours 12 hrs  Hydroxychloroquine Plaquenil 11 days   Prasugrel Effient 7-10 days 6 hrs  Reteplase Retavase 10 days 10 days  Rivaroxaban Xarelto 3 days 6 hrs  Ticagrelor Brilinta 5-7 days 6 hrs  Ticlopidine Ticlid 10-14 days 2 hrs  Tinzaparin Innohep 24 hours 4 hrs  Tirofiban Aggrastat 8 hours 2 hrs  Warfarin Coumadin 5 days 2 hrs   Other medications with blood-thinning effects  Product indications Generic (Brand) names Note  Cholesterol Lipitor Stop 4 days before procedure  Blood thinner (injectable) Heparin (LMW or LMWH Heparin) Stop 24 hours before procedure  Cancer Ibrutinib (Imbruvica) Stop 7 days before procedure  Malaria/Rheumatoid Hydroxychloroquine (Plaquenil) Stop 11 days before procedure  Thrombolytics  10 days before or after procedures   Over-the-counter (OTC) Products with blood-thinning effects  Product Common names Stop Time  Aspirin > 325 mg Goody Powders, Excedrin, etc. 11 days  Aspirin ? 81 mg  7 days  Fish oil  4 days  Garlic supplements  7 days   Ginkgo biloba  36 hours  Ginseng  24 hours  NSAIDs Ibuprofen, Naprosyn, etc. 3 days  Vitamin E  4 days   ____________________________________________________________________________________________  ____________________________________________________________________________________________  Preparing for Procedure with Sedation  Procedure appointments are limited to planned procedures: . No Prescription Refills. . No disability issues will be discussed. . No medication changes will be discussed.  Instructions: . Oral Intake: Do not eat or drink anything for at least 8 hours prior to your procedure. (Exception: Blood Pressure Medication. See below.) . Transportation: Unless otherwise stated by your physician, you may drive yourself after the procedure. . Blood Pressure Medicine: Do not forget to take your blood pressure medicine with a sip of water the morning of the procedure. If your Diastolic (lower reading)is above 100 mmHg, elective cases will be cancelled/rescheduled. . Blood thinners: These will need to be stopped for procedures. Notify our staff if you are taking any blood thinners. Depending on which one you take, there will be specific instructions on how and when to stop it. . Diabetics on insulin: Notify the staff so that you can be scheduled 1st case in the morning. If your diabetes requires high dose insulin, take only  of your normal insulin dose the morning of the procedure and notify the staff that you have done so. . Preventing infections: Shower with an antibacterial soap the morning of your procedure. . Build-up your immune system: Take 1000 mg of Vitamin C with every meal (3 times a day) the day prior to your procedure. Marland Kitchen Antibiotics: Inform the staff if you have  a condition or reason that requires you to take antibiotics before dental procedures. . Pregnancy: If you are pregnant, call and cancel the procedure. . Sickness: If you have a cold, fever, or any active  infections, call and cancel the procedure. . Arrival: You must be in the facility at least 30 minutes prior to your scheduled procedure. . Children: Do not bring children with you. . Dress appropriately: Bring dark clothing that you would not mind if they get stained. . Valuables: Do not bring any jewelry or valuables.  Reasons to call and reschedule or cancel your procedure: (Following these recommendations will minimize the risk of a serious complication.) . Surgeries: Avoid having procedures within 2 weeks of any surgery. (Avoid for 2 weeks before or after any surgery). . Flu Shots: Avoid having procedures within 2 weeks of a flu shots or . (Avoid for 2 weeks before or after immunizations). . Barium: Avoid having a procedure within 7-10 days after having had a radiological study involving the use of radiological contrast. (Myelograms, Barium swallow or enema study). . Heart attacks: Avoid any elective procedures or surgeries for the initial 6 months after a "Myocardial Infarction" (Heart Attack). . Blood thinners: It is imperative that you stop these medications before procedures. Let us know if you if you take any blood thinner.  . Infection: Avoid procedures during or within two weeks of an infection (including chest colds or gastrointestinal problems). Symptoms associated with infections include: Localized redness, fever, chills, night sweats or profuse sweating, burning sensation when voiding, cough, congestion, stuffiness, runny nose, sore throat, diarrhea, nausea, vomiting, cold or Flu symptoms, recent or current infections. It is specially important if the infection is over the area that we intend to treat. Marland Kitchen Heart and lung problems: Symptoms that may suggest an active cardiopulmonary problem include: cough, chest pain, breathing difficulties or shortness of breath, dizziness, ankle swelling, uncontrolled high or unusually low blood pressure, and/or palpitations. If you are experiencing any of  these symptoms, cancel your procedure and contact your primary care physician for an evaluation.  Remember:  Regular Business hours are:  Monday to Thursday 8:00 AM to 4:00 PM  Provider's Schedule: Milinda Pointer, MD:  Procedure days: Tuesday and Thursday 7:30 AM to 4:00 PM  Gillis Santa, MD:  Procedure days: Monday and Wednesday 7:30 AM to 4:00 PM ____________________________________________________________________________________________   ____________________________________________________________________________________________  General Risks and Possible Complications  Patient Responsibilities: It is important that you read this as it is part of your informed consent. It is our duty to inform you of the risks and possible complications associated with treatments offered to you. It is your responsibility as a patient to read this and to ask questions about anything that is not clear or that you believe was not covered in this document.  Patient's Rights: You have the right to refuse treatment. You also have the right to change your mind, even after initially having agreed to have the treatment done. However, under this last option, if you wait until the last second to change your mind, you may be charged for the materials used up to that point.  Introduction: Medicine is not an Chief Strategy Officer. Everything in Medicine, including the lack of treatment(s), carries the potential for danger, harm, or loss (which is by definition: Risk). In Medicine, a complication is a secondary problem, condition, or disease that can aggravate an already existing one. All treatments carry the risk of possible complications. The fact that a side effects or complications occurs, does  not imply that the treatment was conducted incorrectly. It must be clearly understood that these can happen even when everything is done following the highest safety standards.  No treatment: You can choose not to proceed with  the proposed treatment alternative. The "PRO(s)" would include: avoiding the risk of complications associated with the therapy. The "CON(s)" would include: not getting any of the treatment benefits. These benefits fall under one of three categories: diagnostic; therapeutic; and/or palliative. Diagnostic benefits include: getting information which can ultimately lead to improvement of the disease or symptom(s). Therapeutic benefits are those associated with the successful treatment of the disease. Finally, palliative benefits are those related to the decrease of the primary symptoms, without necessarily curing the condition (example: decreasing the pain from a flare-up of a chronic condition, such as incurable terminal cancer).  General Risks and Complications: These are associated to most interventional treatments. They can occur alone, or in combination. They fall under one of the following six (6) categories: no benefit or worsening of symptoms; bleeding; infection; nerve damage; allergic reactions; and/or death. 1. No benefits or worsening of symptoms: In Medicine there are no guarantees, only probabilities. No healthcare provider can ever guarantee that a medical treatment will work, they can only state the probability that it may. Furthermore, there is always the possibility that the condition may worsen, either directly, or indirectly, as a consequence of the treatment. 2. Bleeding: This is more common if the patient is taking a blood thinner, either prescription or over the counter (example: Goody Powders, Fish oil, Aspirin, Garlic, etc.), or if suffering a condition associated with impaired coagulation (example: Hemophilia, cirrhosis of the liver, low platelet counts, etc.). However, even if you do not have one on these, it can still happen. If you have any of these conditions, or take one of these drugs, make sure to notify your treating physician. 3. Infection: This is more common in patients with a  compromised immune system, either due to disease (example: diabetes, cancer, human immunodeficiency virus [HIV], etc.), or due to medications or treatments (example: therapies used to treat cancer and rheumatological diseases). However, even if you do not have one on these, it can still happen. If you have any of these conditions, or take one of these drugs, make sure to notify your treating physician. 4. Nerve Damage: This is more common when the treatment is an invasive one, but it can also happen with the use of medications, such as those used in the treatment of cancer. The damage can occur to small secondary nerves, or to large primary ones, such as those in the spinal cord and brain. This damage may be temporary or permanent and it may lead to impairments that can range from temporary numbness to permanent paralysis and/or brain death. 5. Allergic Reactions: Any time a substance or material comes in contact with our body, there is the possibility of an allergic reaction. These can range from a mild skin rash (contact dermatitis) to a severe systemic reaction (anaphylactic reaction), which can result in death. 6. Death: In general, any medical intervention can result in death, most of the time due to an unforeseen complication. ____________________________________________________________________________________________

## 2020-10-10 NOTE — Progress Notes (Signed)
Safety precautions to be maintained throughout the outpatient stay will include: orient to surroundings, keep bed in low position, maintain call bell within reach at all times, provide assistance with transfer out of bed and ambulation.  

## 2020-10-11 ENCOUNTER — Emergency Department
Admission: EM | Admit: 2020-10-11 | Discharge: 2020-10-11 | Disposition: A | Discharge status: Home / Self Care | Payer: Medicare Other | Attending: Student in an Organized Health Care Education/Training Program | Admitting: Student in an Organized Health Care Education/Training Program

## 2020-10-11 ENCOUNTER — Other Ambulatory Visit: Payer: Self-pay

## 2020-10-11 ENCOUNTER — Emergency Department: Payer: Medicare Other

## 2020-10-11 DIAGNOSIS — J45909 Unspecified asthma, uncomplicated: Secondary | ICD-10-CM | POA: Insufficient documentation

## 2020-10-11 DIAGNOSIS — E039 Hypothyroidism, unspecified: Secondary | ICD-10-CM | POA: Insufficient documentation

## 2020-10-11 DIAGNOSIS — Z79899 Other long term (current) drug therapy: Secondary | ICD-10-CM | POA: Diagnosis not present

## 2020-10-11 DIAGNOSIS — M25512 Pain in left shoulder: Secondary | ICD-10-CM | POA: Insufficient documentation

## 2020-10-11 DIAGNOSIS — J449 Chronic obstructive pulmonary disease, unspecified: Secondary | ICD-10-CM | POA: Diagnosis not present

## 2020-10-11 DIAGNOSIS — Z96653 Presence of artificial knee joint, bilateral: Secondary | ICD-10-CM | POA: Insufficient documentation

## 2020-10-11 DIAGNOSIS — I10 Essential (primary) hypertension: Secondary | ICD-10-CM | POA: Diagnosis not present

## 2020-10-11 DIAGNOSIS — F17210 Nicotine dependence, cigarettes, uncomplicated: Secondary | ICD-10-CM | POA: Diagnosis not present

## 2020-10-11 DIAGNOSIS — Z7901 Long term (current) use of anticoagulants: Secondary | ICD-10-CM | POA: Diagnosis not present

## 2020-10-11 DIAGNOSIS — G8929 Other chronic pain: Secondary | ICD-10-CM | POA: Diagnosis not present

## 2020-10-11 DIAGNOSIS — M5441 Lumbago with sciatica, right side: Secondary | ICD-10-CM | POA: Diagnosis not present

## 2020-10-11 DIAGNOSIS — W19XXXA Unspecified fall, initial encounter: Secondary | ICD-10-CM | POA: Diagnosis not present

## 2020-10-11 DIAGNOSIS — W010XXA Fall on same level from slipping, tripping and stumbling without subsequent striking against object, initial encounter: Secondary | ICD-10-CM | POA: Diagnosis not present

## 2020-10-11 MED ORDER — ACETAMINOPHEN 325 MG PO TABS
325.0000 mg | ORAL_TABLET | Freq: Once | ORAL | Status: AC
  Administered 2020-10-11: 325 mg via ORAL
  Filled 2020-10-11: qty 1

## 2020-10-11 MED ORDER — HYDROCODONE-ACETAMINOPHEN 5-325 MG PO TABS
1.0000 | ORAL_TABLET | Freq: Three times a day (TID) | ORAL | 0 refills | Status: AC | PRN
Start: 2020-10-11 — End: 2020-10-16

## 2020-10-11 MED ORDER — HYDROCODONE-ACETAMINOPHEN 5-325 MG PO TABS
1.0000 | ORAL_TABLET | Freq: Once | ORAL | Status: AC
  Administered 2020-10-11: 1 via ORAL
  Filled 2020-10-11: qty 1

## 2020-10-11 NOTE — Discharge Instructions (Addendum)
Wear sling at all times except for baths/showers. Take Norco up to 3x daily as needed for pain. You may take Tylenol, up to 500mg  in addition to the Norco 3x daily. Follow up with Orthopedics, or return to ER for any worsening.

## 2020-10-11 NOTE — ED Notes (Signed)
See triage note  States she stubbed her toe  Fell on left shoulder   Denies any other complaints

## 2020-10-11 NOTE — ED Triage Notes (Signed)
Pt comes via EMS from [place] with mechanical fall. Pt denies any LOC or hitting head. Pt landed in grass on left shoulder. Pt states 10/10 pain to shoulder. Pt is hypertensive.  Pt been off blood thinners for 3 days. Pt is A*OX4

## 2020-10-12 ENCOUNTER — Observation Stay
Admission: EM | Admit: 2020-10-12 | Discharge: 2020-10-13 | Disposition: A | Discharge status: Home / Self Care | Payer: Medicare Other | Attending: Internal Medicine | Admitting: Internal Medicine

## 2020-10-12 ENCOUNTER — Emergency Department: Payer: Medicare Other

## 2020-10-12 ENCOUNTER — Telehealth: Payer: Self-pay | Admitting: Pain Medicine

## 2020-10-12 ENCOUNTER — Other Ambulatory Visit: Payer: Self-pay

## 2020-10-12 DIAGNOSIS — W19XXXA Unspecified fall, initial encounter: Secondary | ICD-10-CM | POA: Diagnosis not present

## 2020-10-12 DIAGNOSIS — F17210 Nicotine dependence, cigarettes, uncomplicated: Secondary | ICD-10-CM | POA: Diagnosis not present

## 2020-10-12 DIAGNOSIS — Z79899 Other long term (current) drug therapy: Secondary | ICD-10-CM | POA: Diagnosis not present

## 2020-10-12 DIAGNOSIS — I10 Essential (primary) hypertension: Secondary | ICD-10-CM | POA: Diagnosis not present

## 2020-10-12 DIAGNOSIS — U071 COVID-19: Secondary | ICD-10-CM | POA: Insufficient documentation

## 2020-10-12 DIAGNOSIS — I639 Cerebral infarction, unspecified: Secondary | ICD-10-CM | POA: Diagnosis not present

## 2020-10-12 DIAGNOSIS — M546 Pain in thoracic spine: Secondary | ICD-10-CM | POA: Diagnosis present

## 2020-10-12 DIAGNOSIS — R55 Syncope and collapse: Secondary | ICD-10-CM

## 2020-10-12 DIAGNOSIS — Z96653 Presence of artificial knee joint, bilateral: Secondary | ICD-10-CM | POA: Insufficient documentation

## 2020-10-12 DIAGNOSIS — G459 Transient cerebral ischemic attack, unspecified: Principal | ICD-10-CM | POA: Insufficient documentation

## 2020-10-12 DIAGNOSIS — I4891 Unspecified atrial fibrillation: Secondary | ICD-10-CM | POA: Diagnosis not present

## 2020-10-12 DIAGNOSIS — G894 Chronic pain syndrome: Secondary | ICD-10-CM | POA: Diagnosis present

## 2020-10-12 DIAGNOSIS — R531 Weakness: Secondary | ICD-10-CM

## 2020-10-12 DIAGNOSIS — R7303 Prediabetes: Secondary | ICD-10-CM | POA: Insufficient documentation

## 2020-10-12 DIAGNOSIS — I959 Hypotension, unspecified: Secondary | ICD-10-CM | POA: Diagnosis not present

## 2020-10-12 DIAGNOSIS — E785 Hyperlipidemia, unspecified: Secondary | ICD-10-CM | POA: Diagnosis present

## 2020-10-12 DIAGNOSIS — I672 Cerebral atherosclerosis: Secondary | ICD-10-CM | POA: Diagnosis not present

## 2020-10-12 DIAGNOSIS — R42 Dizziness and giddiness: Secondary | ICD-10-CM | POA: Diagnosis not present

## 2020-10-12 DIAGNOSIS — J45909 Unspecified asthma, uncomplicated: Secondary | ICD-10-CM | POA: Diagnosis not present

## 2020-10-12 DIAGNOSIS — J441 Chronic obstructive pulmonary disease with (acute) exacerbation: Secondary | ICD-10-CM | POA: Diagnosis present

## 2020-10-12 DIAGNOSIS — Z7901 Long term (current) use of anticoagulants: Secondary | ICD-10-CM | POA: Diagnosis not present

## 2020-10-12 DIAGNOSIS — G8929 Other chronic pain: Secondary | ICD-10-CM | POA: Diagnosis present

## 2020-10-12 DIAGNOSIS — J449 Chronic obstructive pulmonary disease, unspecified: Secondary | ICD-10-CM | POA: Insufficient documentation

## 2020-10-12 DIAGNOSIS — R0902 Hypoxemia: Secondary | ICD-10-CM | POA: Diagnosis not present

## 2020-10-12 DIAGNOSIS — M25512 Pain in left shoulder: Secondary | ICD-10-CM | POA: Diagnosis present

## 2020-10-12 DIAGNOSIS — M5137 Other intervertebral disc degeneration, lumbosacral region: Secondary | ICD-10-CM | POA: Diagnosis present

## 2020-10-12 DIAGNOSIS — M5416 Radiculopathy, lumbar region: Secondary | ICD-10-CM | POA: Diagnosis present

## 2020-10-12 DIAGNOSIS — E039 Hypothyroidism, unspecified: Secondary | ICD-10-CM | POA: Insufficient documentation

## 2020-10-12 DIAGNOSIS — R29810 Facial weakness: Secondary | ICD-10-CM | POA: Diagnosis not present

## 2020-10-12 LAB — COMPREHENSIVE METABOLIC PANEL
ALT: 16 U/L (ref 0–44)
AST: 21 U/L (ref 15–41)
Albumin: 3.8 g/dL (ref 3.5–5.0)
Alkaline Phosphatase: 58 U/L (ref 38–126)
Anion gap: 8 (ref 5–15)
BUN: 23 mg/dL (ref 8–23)
CO2: 26 mmol/L (ref 22–32)
Calcium: 8.9 mg/dL (ref 8.9–10.3)
Chloride: 106 mmol/L (ref 98–111)
Creatinine, Ser: 0.64 mg/dL (ref 0.44–1.00)
GFR, Estimated: >60 mL/min (ref >60)
Glucose, Bld: 121 mg/dL — ABNORMAL HIGH (ref 70–99)
Potassium: 4.2 mmol/L (ref 3.5–5.1)
Sodium: 140 mmol/L (ref 135–145)
Total Bilirubin: 0.6 mg/dL (ref 0.3–1.2)
Total Protein: 6.8 g/dL (ref 6.5–8.1)

## 2020-10-12 LAB — CBC WITH DIFFERENTIAL/PLATELET
Abs Immature Granulocytes: 0.05 10*3/uL (ref 0.00–0.07)
Basophils Absolute: 0 10*3/uL (ref 0.0–0.1)
Basophils Relative: 1 %
Eosinophils Absolute: 0.2 10*3/uL (ref 0.0–0.5)
Eosinophils Relative: 2 %
HCT: 37.5 % (ref 36.0–46.0)
Hemoglobin: 12.1 g/dL (ref 12.0–15.0)
Immature Granulocytes: 1 %
Lymphocytes Relative: 14 %
Lymphs Abs: 1.2 10*3/uL (ref 0.7–4.0)
MCH: 30.3 pg (ref 26.0–34.0)
MCHC: 32.3 g/dL (ref 30.0–36.0)
MCV: 94 fL (ref 80.0–100.0)
Monocytes Absolute: 0.6 10*3/uL (ref 0.1–1.0)
Monocytes Relative: 7 %
Neutro Abs: 6.7 10*3/uL (ref 1.7–7.7)
Neutrophils Relative %: 75 %
Platelets: 234 10*3/uL (ref 150–400)
RBC: 3.99 MIL/uL (ref 3.87–5.11)
RDW: 13.6 % (ref 11.5–15.5)
WBC: 8.7 10*3/uL (ref 4.0–10.5)
nRBC: 0 % (ref 0.0–0.2)

## 2020-10-12 LAB — URINALYSIS, COMPLETE (UACMP) WITH MICROSCOPIC
Bilirubin Urine: NEGATIVE
Glucose, UA: NEGATIVE mg/dL
Ketones, ur: NEGATIVE mg/dL
Leukocytes,Ua: NEGATIVE
Nitrite: NEGATIVE
Protein, ur: NEGATIVE mg/dL
Specific Gravity, Urine: 1.015 (ref 1.005–1.030)
pH: 5 (ref 5.0–8.0)

## 2020-10-12 LAB — TROPONIN I (HIGH SENSITIVITY)
Troponin I (High Sensitivity): 8 ng/L (ref <18)
Troponin I (High Sensitivity): 8 ng/L (ref <18)

## 2020-10-12 LAB — PROTIME-INR
INR: 1 (ref 0.8–1.2)
Prothrombin Time: 12.5 seconds (ref 11.4–15.2)

## 2020-10-12 MED ORDER — DONEPEZIL HCL 5 MG PO TABS
5.0000 mg | ORAL_TABLET | Freq: Every day | ORAL | Status: DC
  Administered 2020-10-12: 5 mg via ORAL
  Filled 2020-10-12 (×2): qty 1

## 2020-10-12 MED ORDER — VENLAFAXINE HCL ER 150 MG PO CP24
150.0000 mg | ORAL_CAPSULE | Freq: Every day | ORAL | Status: DC
  Administered 2020-10-13: 150 mg via ORAL
  Filled 2020-10-12 (×2): qty 1

## 2020-10-12 MED ORDER — KETOROLAC TROMETHAMINE 15 MG/ML IJ SOLN
15.0000 mg | Freq: Four times a day (QID) | INTRAMUSCULAR | Status: DC | PRN
Start: 2020-10-12 — End: 2020-10-13
  Administered 2020-10-12 – 2020-10-13 (×2): 15 mg via INTRAVENOUS
  Filled 2020-10-12 (×3): qty 1

## 2020-10-12 MED ORDER — VITAMIN B-12 1000 MCG PO TABS
5000.0000 ug | ORAL_TABLET | Freq: Every day | ORAL | Status: DC
  Administered 2020-10-13: 5000 ug via ORAL
  Filled 2020-10-12: qty 5

## 2020-10-12 MED ORDER — SENNOSIDES-DOCUSATE SODIUM 8.6-50 MG PO TABS: 1.0000 | ORAL_TABLET | Freq: Every evening | ORAL | Status: DC | PRN

## 2020-10-12 MED ORDER — STROKE: EARLY STAGES OF RECOVERY BOOK: Freq: Once | Status: DC

## 2020-10-12 MED ORDER — ACETAMINOPHEN 160 MG/5ML PO SOLN
325.0000 mg | ORAL | Status: DC | PRN
  Filled 2020-10-12: qty 10.2

## 2020-10-12 MED ORDER — HYDROCODONE-ACETAMINOPHEN 7.5-325 MG PO TABS
1.0000 | ORAL_TABLET | Freq: Four times a day (QID) | ORAL | Status: DC | PRN
  Administered 2020-10-13: 1 via ORAL
  Filled 2020-10-12: qty 1

## 2020-10-12 MED ORDER — MORPHINE SULFATE (PF) 2 MG/ML IV SOLN
1.0000 mg | INTRAVENOUS | Status: DC | PRN
Start: 2020-10-12 — End: 2020-10-13
  Administered 2020-10-12: 1 mg via INTRAVENOUS
  Filled 2020-10-12: qty 1

## 2020-10-12 MED ORDER — VITAMIN B-12 5000 MCG SL SUBL: 5000.0000 ug | SUBLINGUAL_TABLET | Freq: Every day | SUBLINGUAL | Status: DC

## 2020-10-12 MED ORDER — LEVOTHYROXINE SODIUM 50 MCG PO TABS
75.0000 ug | ORAL_TABLET | Freq: Every day | ORAL | Status: DC
  Administered 2020-10-13: 75 ug via ORAL
  Filled 2020-10-12: qty 1

## 2020-10-12 MED ORDER — APIXABAN 5 MG PO TABS
5.0000 mg | ORAL_TABLET | Freq: Two times a day (BID) | ORAL | Status: DC
  Administered 2020-10-12 – 2020-10-13 (×2): 5 mg via ORAL
  Filled 2020-10-12 (×2): qty 1

## 2020-10-12 MED ORDER — LABETALOL HCL 5 MG/ML IV SOLN: 5.0000 mg | INTRAVENOUS | Status: AC | PRN

## 2020-10-12 MED ORDER — ALBUTEROL SULFATE HFA 108 (90 BASE) MCG/ACT IN AERS
2.0000 | INHALATION_SPRAY | Freq: Four times a day (QID) | RESPIRATORY_TRACT | Status: DC | PRN
  Filled 2020-10-12: qty 6.7

## 2020-10-12 MED ORDER — ACETAMINOPHEN 325 MG PO TABS: 325.0000 mg | ORAL_TABLET | ORAL | Status: DC | PRN

## 2020-10-12 MED ORDER — ACETAMINOPHEN 650 MG RE SUPP: 325.0000 mg | RECTAL | Status: DC | PRN

## 2020-10-12 MED ORDER — ATORVASTATIN CALCIUM 80 MG PO TABS
80.0000 mg | ORAL_TABLET | Freq: Every day | ORAL | Status: DC
  Administered 2020-10-12: 80 mg via ORAL
  Filled 2020-10-12: qty 4

## 2020-10-12 NOTE — ED Notes (Addendum)
Called pharmacy to ask them to verify Eliquis. They said will verify now.

## 2020-10-12 NOTE — ED Notes (Signed)
Overdue meds not verified yet by pharmacy.

## 2020-10-12 NOTE — ED Notes (Signed)
Pt moved to hospital bed per pt request.

## 2020-10-12 NOTE — ED Notes (Signed)
Pt sitting at side of bed eating dinner tray. No needs expressed at this time.

## 2020-10-12 NOTE — Telephone Encounter (Signed)
Attempted to call daughter, voicemail left

## 2020-10-12 NOTE — ED Notes (Signed)
Floor nurse has accepted report on pt and states that room is clean. Pt will be brought up after protected time is over. Asked floor nurse to add night nurse to conversation so handoff can be seen.

## 2020-10-12 NOTE — ED Notes (Signed)
Pt and daughter asking if orthopedic doctor can evaluate pt because L arm in sling and not moving L arm. Explained that she may be seen ny PO and OT. Informed will let Dr Tobie Poet know they are asking for ortho eval.

## 2020-10-12 NOTE — ED Notes (Signed)
Assisted pt to to pivot to a hospital bed. No other needs found.

## 2020-10-12 NOTE — Telephone Encounter (Signed)
Was speaking with daughter, [name] Douglas about the procedure tomorrow.  She was questioning if we were going to be able to do this procedure with her shoulder injury.  I felt we should reschedule to time when she would be able to move her arm but the family did not want to reschedle.  Spoke with Dr Dossie Arbour and he states that if they think she can safely and comfortably lie on her stomach to have this procedure done, then to proceed.   But if they felt it would be too painful, then reschedule. The daughter was on hold while I spoke to Dr Dossie Arbour but the call was disconnected for some reason.  Attempted to call daughter back to discuss.  LEft message for her to call our office to continue the discussion.

## 2020-10-12 NOTE — ED Provider Notes (Signed)
[facility] Emergency Department Provider Note   ____________________________________________   Event Date/Time   First MD Initiated Contact with Patient 10/12/20 1046     (approximate)  I have reviewed the triage vital signs and the nursing notes.   HISTORY  Chief Complaint Weakness    HPI [name] Douglas is a 80 y.o. female with past medical history of hypertension, hyperlipidemia, COPD, stroke, and atrial fibrillation on Eliquis who presents to the ED complaining of weakness. Family reportedly called EMS after patient had sudden onset left-sided facial droop and difficulty speaking. Patient states that she felt very lightheaded and like she was going to pass out around the same time she noticed the speech difficulties. She has been holding her Eliquis recently in anticipation of steroid injection for chronic back pain. She also had a fall yesterday at [employer], was seen in the ED with left shoulder pain and x-rays at that time were negative. Patient continues to complain of left shoulder pain, unchanged from yesterday. She denies any numbness or weakness in her extremities. She is otherwise felt well with no fevers, cough, chest pain, or shortness of breath.        Past Medical History:  Diagnosis Date  . Anxiety   . Arthritis    "shoulders" (08/20/2014)  . Asthma   . Chronic lower back pain   . Complication of anesthesia    "agitated & restless after knee replacement in 2008"  . COPD (chronic obstructive pulmonary disease) ([place])   . DDD (degenerative disc disease), lumbar   . Depression   . Diverticulosis   . GERD (gastroesophageal reflux disease)   . High cholesterol   . Hypertension   . Hypothyroidism   . Memory loss    mild  . On home oxygen therapy    "2L at night" (08/20/2014)  . Peripheral vascular disease ([place])   . Pre-diabetes   . Shortness of breath dyspnea    with exertion  . Stroke ([place])    mild, no deficits    Patient  Active Problem List   Diagnosis Date Noted  . Chronic thoracic back pain (Left) 10/10/2020  . T12 compression fracture, sequela 10/10/2020  . Lumbar foraminal stenosis (Bilateral: L2-3, L3-4, L4-5) (Right: L5-S1) (Severe left L4-5) 09/08/2020  . Grade 1 Lumbar Anterolisthesis (L3/4 and L4/5) 09/08/2020  . Grade 1 Lumbar Retrolisthesis (L2/3) 09/08/2020  . Protrusion of lumbar intervertebral disc (Multilevel) 09/08/2020  . Compression fracture of L1 lumbar vertebra, sequela 09/08/2020  . Other intervertebral disc degeneration, lumbar region 09/08/2020  . Lumbosacral facet syndrome (Multilevel) (Bilateral) 09/08/2020  . Lumbar facet hypertrophy (Multilevel) (Bilateral) 09/08/2020  . Ligamentum flavum hypertrophy 09/08/2020  . Lumbar lateral recess stenosis (Right: L2-3) 09/08/2020  . DDD (degenerative disc disease), lumbosacral 09/08/2020  . Compression fracture of L5 vertebra ([place]) 09/08/2020  . Osteoporosis with pathological fracture of lumbar vertebra ([place]) 09/08/2020  . Vitamin B12 deficiency 09/08/2020  . Chronic pain syndrome 09/07/2020  . Pharmacologic therapy 09/07/2020  . Disorder of skeletal system 09/07/2020  . Problems influencing health status 09/07/2020  . Uncomplicated opioid dependence ([place]) 09/07/2020  . Chronic low back pain (2ry area of Pain) (Bilateral) (R>L) w/ sciatica (Right) 09/07/2020  . Chronic lower extremity pain (1ry area of Pain) (Right) 09/07/2020  . Chronic lumbar radiculopathy (Right) 09/07/2020  . Lumbosacral radiculopathy at L2 (Right) 09/07/2020  . Lumbar radiculitis (L2 dermatome) (Right) 09/07/2020  . Unspecified inflammatory spondylopathy, lumbar region ([place]) 09/07/2020  . Chronic anticoagulation ([place])  09/07/2020  . Abnormal MRI, lumbar spine (04/20/2020) 09/07/2020  . Intractable low back pain 08/03/2020  . Fall down stairs 08/03/2020  . Hemorrhagic stroke ([place]) 08/03/2020  . CVA (cerebral vascular accident) ([place]) 07/14/2020  . Blurred  vision 07/14/2020  . Lumbar central spinal stenosis (Moderate: L2-3  Severe: L3-4, L4-5) w/ neurogenic claudication 05/31/2020  . Muscle spasm 04/10/2020  . Urinary tract infection with hematuria 04/10/2020  . Headache disorder 12/21/2019  . History of stroke 12/21/2019  . Dizziness 11/06/2019  . Atopic dermatitis 10/28/2019  . History of CVA (cerebrovascular accident) 10/07/2019  . Intractable episodic cluster headache 09/20/2019  . Other symptoms and signs involving the nervous system 09/20/2019  . Iron deficiency anemia 05/17/2019  . Vitamin D deficiency 05/17/2019  . Flu vaccine need 05/17/2019  . Encounter for general adult medical examination with abnormal findings 04/05/2019  . Nausea and vomiting 04/05/2019  . Dysuria 04/05/2019  . Need for vaccination against Streptococcus pneumoniae using pneumococcal conjugate vaccine 7 04/05/2019  . Occlusion and stenosis of bilateral carotid arteries 11/24/2017  . Subarachnoid hemorrhage ([place]) 12/09/2016  . L brain TIA (transient ischemic attack) 08/24/2016  . Solitary pulmonary nodule 08/24/2016  . Primary localized osteoarthritis of right knee 07/03/2016  . Ileus ([place]) 04/24/2016  . Colitis due to Clostridium difficile   . Recurrent Clostridium difficile diarrhea   . Enteritis due to Clostridium difficile 08/22/2014  . Diarrhea 08/20/2014  . Essential hypertension 08/20/2014  . Hyperlipidemia 08/20/2014  . Hypothyroidism 08/20/2014  . Tobacco abuse 08/20/2014  . COPD (chronic obstructive pulmonary disease) ([place]) 08/20/2014  . Acute diarrhea 07/27/2014    Past Surgical History:  Procedure Laterality Date  . CARDIAC CATHETERIZATION  ~ 2007  . EXCISIONAL HEMORRHOIDECTOMY  1960's  . EYE SURGERY Bilateral    Cataract extraction with IOL  . KNEE ARTHROSCOPY Left X 2 <2008  . KNEE ARTHROSCOPY WITH SUBCHONDROPLASTY Right 04/19/2016   Procedure: KNEE ARTHROSCOPY WITH SUBCHONDROPLASTY, PARTIAL MENISCECTOMY;  Surgeon: Hessie Knows, MD;   Location: [facility] ORS;  Service: Orthopedics;  Laterality: Right;  . LOOP RECORDER INSERTION N/A 01/02/2018   Procedure: LOOP RECORDER INSERTION;  Surgeon: Deboraha Sprang, MD;  Location: [facility];  Service: Cardiovascular;  Laterality: N/A;  . SHOULDER OPEN ROTATOR CUFF REPAIR Right 2001  . TONSILLECTOMY  ~ 1950  . TOTAL KNEE ARTHROPLASTY Left 2008  . TOTAL KNEE ARTHROPLASTY Right 07/03/2016   Procedure: TOTAL KNEE ARTHROPLASTY;  Surgeon: Hessie Knows, MD;  Location: [facility] ORS;  Service: Orthopedics;  Laterality: Right;  . TUBAL LIGATION  1973  . VAGINAL HYSTERECTOMY  1974    Prior to Admission medications   Medication Sig Start Date End Date Taking? Authorizing Provider  albuterol (VENTOLIN HFA) 108 (90 Base) MCG/ACT inhaler Inhale 2 puffs into the lungs every 6 (six) hours as needed for wheezing or shortness of breath. 08/05/20   Shelly Coss, MD  apixaban (ELIQUIS) 5 MG TABS tablet Take 1 tablet (5 mg total) by mouth 2 (two) times daily. 09/13/20   Deboraha Sprang, MD  Aspirin-Acetaminophen-Caffeine (GOODY HEADACHE PO) Take 1 packet by mouth daily as needed (headaches/pain).    [provider]  atorvastatin (LIPITOR) 80 MG tablet Take 1 tablet (80 mg total) by mouth daily at 6 PM. 08/22/20   Boscia, Greer Ee, NP  Cobalamin Combinations (B-12) [phone] MCG SUBL Place under the tongue.    [provider]  Cyanocobalamin (VITAMIN B-12) 5000 MCG SUBL Place 1 tablet (5,000 mcg total) under the tongue daily.  09/08/20 12/07/20  Milinda Pointer, MD  cyclobenzaprine (FLEXERIL) 5 MG tablet Take 1 tablet (5 mg total) by mouth at bedtime. 06/01/20   Ronnell Freshwater, NP  docusate sodium (COLACE) 100 MG capsule Take 100 mg by mouth daily as needed for mild constipation.     [provider]  donepezil (ARICEPT) 5 MG tablet Take 1 tablet (5 mg total) by mouth at bedtime. 04/28/20   Ronnell Freshwater, NP  ergocalciferol (DRISDOL) 1.25 MG (50000 UT) capsule Take 1  capsule (50,000 Units total) by mouth every Friday. 07/29/20   Lavera Guise, MD  gabapentin (NEURONTIN) 100 MG capsule Take 100 mg by mouth 3 (three) times daily. 06/24/20   [provider]  HYDROcodone-acetaminophen (NORCO) 5-325 MG tablet Take 1 tablet by mouth 3 (three) times daily as needed for up to 5 days for moderate pain. 10/11/20 10/16/20  Marlana Salvage, PA  levothyroxine (SYNTHROID) 75 MCG tablet Take 1 tablet (75 mcg total) by mouth daily before breakfast. 08/17/20   Ronnell Freshwater, NP  losartan (COZAAR) 25 MG tablet Take 1 tablet (25 mg total) by mouth daily. 04/28/20   Ronnell Freshwater, NP  metoprolol succinate (TOPROL-XL) 50 MG 24 hr tablet Take 2 tablets (100 mg total) by mouth daily. 09/28/20   Lavera Guise, MD  venlafaxine XR (EFFEXOR-XR) 75 MG 24 hr capsule TAKE 2 CAPSULES BY MOUTH DAILY. 09/28/20   Lavera Guise, MD    Allergies Heparin  Family History  Problem Relation Age of Onset  . Diabetes Mother   . Hypertension Mother   . Lung cancer Mother   . Congestive Heart Failure Father   . Congestive Heart Failure Sister     Social History Social History   Tobacco Use  . Smoking status: Current Some Day Smoker    Packs/day: 0.25    Years: 53.00    Pack years: 13.25    Types: Cigarettes  . Smokeless tobacco: Never Used  . Tobacco comment: 3 cigarettes in week  Vaping Use  . Vaping Use: Never used  Substance Use Topics  . Alcohol use: No  . Drug use: No    Review of Systems  Constitutional: No fever/chills Eyes: No visual changes. ENT: No sore throat. Cardiovascular: Denies chest pain. Positive for lightheadedness. Respiratory: Denies shortness of breath. Gastrointestinal: No abdominal pain.  No nausea, no vomiting.  No diarrhea.  No constipation. Genitourinary: Negative for dysuria. Musculoskeletal: Negative for back pain. Skin: Negative for rash. Neurological: Negative for headaches, focal weakness or numbness. Positive for difficulty  speaking and facial droop.  ____________________________________________   PHYSICAL EXAM:  VITAL SIGNS: ED Triage Vitals  Enc Vitals Group     BP --      Pulse --      Resp --      Temp --      Temp src --      SpO2 --      Weight 10/12/20 1048 209 lb (94.8 kg)     Height 10/12/20 1048 5\' 4"  (1.626 m)     Head Circumference --      Peak Flow --      Pain Score 10/12/20 1047 0     Pain Loc --      Pain Edu? --      Excl. in [place]? --     Constitutional: Alert and oriented. Eyes: Conjunctivae are normal. Head: Atraumatic. Nose: No congestion/rhinnorhea. Mouth/Throat: Mucous membranes are moist. Neck: Normal ROM, no  midline cervical spine tenderness. Cardiovascular: Normal rate, regular rhythm. Grossly normal heart sounds. Respiratory: Normal respiratory effort.  No retractions. Lungs CTAB. Gastrointestinal: Soft and nontender. No distention. Genitourinary: deferred Musculoskeletal: No lower extremity tenderness nor edema. Left shoulder in sling with pain with any movement of left upper extremity. Neurologic:  Normal speech and language. No gross focal neurologic deficits are appreciated. Skin:  Skin is warm, dry and intact. No rash noted. Psychiatric: Mood and affect are normal. Speech and behavior are normal.  ____________________________________________   LABS (all labs ordered are listed, but only abnormal results are displayed)  Labs Reviewed  COMPREHENSIVE METABOLIC PANEL - Abnormal; Notable for the following components:      Result Value   Glucose, Bld 121 (*)    All other components within normal limits  SARS CORONAVIRUS 2 (TAT 6-24 HRS)  CBC WITH DIFFERENTIAL/PLATELET  PROTIME-INR  URINALYSIS, COMPLETE (UACMP) WITH MICROSCOPIC  TROPONIN I (HIGH SENSITIVITY)  TROPONIN I (HIGH SENSITIVITY)   ____________________________________________  EKG  ED ECG REPORT I, Blake Divine, the attending physician, personally viewed and interpreted this ECG.   Date:  10/12/2020  EKG Time: 10:48  Rate: 65  Rhythm: normal sinus rhythm  Axis: Normal  Intervals:none  ST&T Change: None   PROCEDURES  Procedure(s) performed (including Critical Care):  Procedures   ____________________________________________   INITIAL IMPRESSION / ASSESSMENT AND PLAN / ED COURSE       80 year old female with past medical history of hypertension, hyperlipidemia, stroke, COPD, and atrial fibrillation on Eliquis who presents to the ED for acute onset difficulty speaking and facial droop around 10:00 this morning. Symptoms now appear to have resolved, no facial droop noted on my exam and patient speech is back to normal. She has no focal deficits and any of her extremities. Presentation seems most consistent with TIA and we will screen labs and CT head.  CT head is negative for acute process, chest x-ray reviewed by me and shows no infiltrate, edema, or effusion.  Lab work is also unremarkable.  Patient continues to be awake and alert with no focal deficits here in the ED.  Case discussed with hospitalist for admission for further TIA work-up.      ____________________________________________   FINAL CLINICAL IMPRESSION(S) / ED DIAGNOSES  Final diagnoses:  TIA (transient ischemic attack)  Near syncope     ED Discharge Orders    None       Note:  This document was prepared using Dragon voice recognition software and may include unintentional dictation errors.   Blake Divine, MD 10/12/20 1235

## 2020-10-12 NOTE — H&P (Signed)
History and Physical   CHASITEE ZENKER YDX:[id number] DOB: May 10, 1941 DOA: 10/12/2020  PCP: Lavera Guise, MD  Outpatient Specialists: Dr. Lacinda Axon, neurosurgery Patient coming from: Home  I have personally briefly reviewed patient's old medical records in [place].  Chief Concern: TIA  HPI: TALMA AGUILLARD is a 80 y.o. female with medical history significant for anxiety, asthma, COPD, hypertension, hyperlipidemia, hypothyroid, chronic low back pain from LV vertebral fracture in the past, history of 3 strokes, presents to the emergency department for chief concerns of strokelike symptoms.  Per patient and daughter at bedside, patient endorses feeling hot, dizziness and felt clammy. Daughter said patient's eyes glazed over and slow to respond. Patient was slow to respond with words but understandable.  Daughter denies loss of consciousness.  Daughter states this episode lasted approximately 30 minutes and has completely resolved.  Daughter states that this was similar to November 2021 episode.   Patient at bedside endorses left-sided arm pain that is 10 out of 10 and minimally improves with IV pain medication.  Patient reports that she had a mechanical fall at [employer] on 10/11/2020 due to her tripping on the uneven concrete.  She reports that she had landed on the grass onto her left shoulder and has had severe pain since.  Multiple x-rays of the shoulder and chest have been done and was read as negative for acute fracture.  Social history: Patient lives at home by herself.  She is a former tobacco user and quit smoking about 20 years ago.  Denies EtOH, recreational drug use.  Vaccination: Patient states that she is fully vaccinated for COVID-19 including booster.  ROS: Constitutional: no weight change, no fever ENT/Mouth: no sore throat, no rhinorrhea Eyes: no eye pain, no vision changes Cardiovascular: no chest pain, no dyspnea,  no edema, no palpitations Respiratory: no cough,  no sputum, no wheezing Gastrointestinal: no nausea, no vomiting, no diarrhea, no constipation Genitourinary: no urinary incontinence, no dysuria, no hematuria Musculoskeletal: + arthralgias, no myalgias Skin: no skin lesions, no pruritus, Neuro: + weakness, no loss of consciousness, no syncope Psych: no anxiety, no depression, no decrease appetite Heme/Lymph: no bruising, no bleeding  ED Course: Discussed with ED provider, patient presenting for strokelike symptoms likely TIA.  Vitals in the emergency department was afebrile with temperatures of 97.9, respiration rate of 14, heart rate 73, blood pressure 151/53, satting at 99% on room air.  Labs were unremarkable.  High-sensitivity troponin was 8x2.  Assessment/Plan  Principal Problem:   TIA (transient ischemic attack) Active Problems:   Essential hypertension   Hyperlipidemia   Hypothyroidism   COPD (chronic obstructive pulmonary disease) (HCC)   Chronic pain syndrome   Chronic lumbar radiculopathy (Right)   DDD (degenerative disc disease), lumbosacral   Chronic thoracic back pain (Left)   Left shoulder pain   TIA-neurology has been consulted and neurology suspects it secondary to short-term discontinuation of her Eliquis -Recommended to resume Eliquis 5 mg twice daily -MRI of the brain -TTE -PT/OT/speech -Modified permissive hypertension protocol for 24 hours, -Labetalol as needed for SBP greater than 180, until 10 AM on 10/13/2020 -Fasting lipid and A1c in the a.m.  Left shoulder pain secondary to mechanical fall -Likely worsened due to osteoarthritis and chronic pain syndrome -Toradol 15 mg IV every 6 hours as needed for moderate pain, morphine 1 mg IV every 3 hours as needed for severe pain -Resumed home Norco 7.5 mg p.o. every 6 hours.  For moderate pain -Family and patient  request Ortho evaluation for her left shoulder pain however I explained to patient that there is no acute fracture on imaging and that her pain is  made worse in the setting of chronic pain syndrome and small subacromial bone spur.  Dementia-donepezil 5 mg nightly  Depression/anxiety venlafaxine 150 mg p.o. daily with breakfast  Hypothyroid-levothyroxine 75 mcg daily before breakfast  Atrial fibrillation-resumed apixaban 5 mg twice daily  Hypertension-holding home antihypertensives due to allowing permissive hypertension modified for 24 hours -A.m. team to resume losartan 25 mg daily, metoprolol succinate 100 mg daily when appropriate  Chronic pain syndrome  Chart reviewed.   DVT prophylaxis: Eliquis 5 mg twice daily Code Status: Limited, no intubation Diet: Heart healthy Family Communication: Updated daughter at bedside Disposition Plan: Pending clinical course Consults called: Neurology Admission status: Observation to progressive cardiac with telemetry  Past Medical History:  Diagnosis Date  . Anxiety   . Arthritis    "shoulders" (08/20/2014)  . Asthma   . Chronic lower back pain   . Complication of anesthesia    "agitated & restless after knee replacement in 2008"  . COPD (chronic obstructive pulmonary disease) ([place])   . DDD (degenerative disc disease), lumbar   . Depression   . Diverticulosis   . GERD (gastroesophageal reflux disease)   . High cholesterol   . Hypertension   . Hypothyroidism   . Memory loss    mild  . On home oxygen therapy    "2L at night" (08/20/2014)  . Peripheral vascular disease ([place])   . Pre-diabetes   . Shortness of breath dyspnea    with exertion  . Stroke ([place])    mild, no deficits   Past Surgical History:  Procedure Laterality Date  . CARDIAC CATHETERIZATION  ~ 2007  . EXCISIONAL HEMORRHOIDECTOMY  1960's  . EYE SURGERY Bilateral    Cataract extraction with IOL  . KNEE ARTHROSCOPY Left X 2 <2008  . KNEE ARTHROSCOPY WITH SUBCHONDROPLASTY Right 04/19/2016   Procedure: KNEE ARTHROSCOPY WITH SUBCHONDROPLASTY, PARTIAL MENISCECTOMY;  Surgeon: Hessie Knows, MD;  Location: [facility];   Service: Orthopedics;  Laterality: Right;  . LOOP RECORDER INSERTION N/A 01/02/2018   Procedure: LOOP RECORDER INSERTION;  Surgeon: Deboraha Sprang, MD;  Location: [facility];  Service: Cardiovascular;  Laterality: N/A;  . SHOULDER OPEN ROTATOR CUFF REPAIR Right 2001  . TONSILLECTOMY  ~ 1950  . TOTAL KNEE ARTHROPLASTY Left 2008  . TOTAL KNEE ARTHROPLASTY Right 07/03/2016   Procedure: TOTAL KNEE ARTHROPLASTY;  Surgeon: Hessie Knows, MD;  Location: [facility];  Service: Orthopedics;  Laterality: Right;  . TUBAL LIGATION  1973  . VAGINAL HYSTERECTOMY  1974   Social History:  reports that she has been smoking cigarettes. She has a 13.25 pack-year smoking history. She has never used smokeless tobacco. She reports that she does not drink alcohol and does not use drugs.  Allergies  Allergen Reactions  . Heparin Nausea And Vomiting   Family History  Problem Relation Age of Onset  . Diabetes Mother   . Hypertension Mother   . Lung cancer Mother   . Congestive Heart Failure Father   . Congestive Heart Failure Sister    Family history: Family history reviewed and not pertinent  Prior to Admission medications   Medication Sig Start Date End Date Taking? Authorizing Provider  albuterol (VENTOLIN HFA) 108 (90 Base) MCG/ACT inhaler Inhale 2 puffs into the lungs every 6 (six) hours as needed for wheezing or shortness of breath. 08/05/20  Shelly Coss, MD  apixaban (ELIQUIS) 5 MG TABS tablet Take 1 tablet (5 mg total) by mouth 2 (two) times daily. 09/13/20   Deboraha Sprang, MD  Aspirin-Acetaminophen-Caffeine (GOODY HEADACHE PO) Take 1 packet by mouth daily as needed (headaches/pain).    [provider]  atorvastatin (LIPITOR) 80 MG tablet Take 1 tablet (80 mg total) by mouth daily at 6 PM. 08/22/20   Boscia, Greer Ee, NP  Cobalamin Combinations (B-12) [phone] MCG SUBL Place under the tongue.    [provider]  Cyanocobalamin (VITAMIN B-12) 5000 MCG SUBL Place 1 tablet  (5,000 mcg total) under the tongue daily. 09/08/20 12/07/20  Milinda Pointer, MD  cyclobenzaprine (FLEXERIL) 5 MG tablet Take 1 tablet (5 mg total) by mouth at bedtime. 06/01/20   Ronnell Freshwater, NP  docusate sodium (COLACE) 100 MG capsule Take 100 mg by mouth daily as needed for mild constipation.     [provider]  donepezil (ARICEPT) 5 MG tablet Take 1 tablet (5 mg total) by mouth at bedtime. 04/28/20   Ronnell Freshwater, NP  ergocalciferol (DRISDOL) 1.25 MG (50000 UT) capsule Take 1 capsule (50,000 Units total) by mouth every Friday. 07/29/20   Lavera Guise, MD  gabapentin (NEURONTIN) 100 MG capsule Take 100 mg by mouth 3 (three) times daily. 06/24/20   [provider]  HYDROcodone-acetaminophen (NORCO) 5-325 MG tablet Take 1 tablet by mouth 3 (three) times daily as needed for up to 5 days for moderate pain. 10/11/20 10/16/20  Marlana Salvage, PA  levothyroxine (SYNTHROID) 75 MCG tablet Take 1 tablet (75 mcg total) by mouth daily before breakfast. 08/17/20   Ronnell Freshwater, NP  losartan (COZAAR) 25 MG tablet Take 1 tablet (25 mg total) by mouth daily. 04/28/20   Ronnell Freshwater, NP  metoprolol succinate (TOPROL-XL) 50 MG 24 hr tablet Take 2 tablets (100 mg total) by mouth daily. 09/28/20   Lavera Guise, MD  venlafaxine XR (EFFEXOR-XR) 75 MG 24 hr capsule TAKE 2 CAPSULES BY MOUTH DAILY. 09/28/20   Lavera Guise, MD   Physical Exam: Vitals:   10/12/20 1530 10/12/20 1600 10/12/20 1700 10/12/20 2012  BP: (!) 146/63 (!) 164/51 (!) 168/57 (!) 148/49  Pulse: 76 72 72 75  Resp: (!) 23 18 (!) 23 18  Temp:    98.8 F (37.1 C)  TempSrc:    Oral  SpO2: 99% 98% 98% 100%  Weight:      Height:       Constitutional: appears age-appropriate, NAD, calm, comfortable Eyes: PERRL, lids and conjunctivae normal ENMT: Mucous membranes are moist. Posterior pharynx clear of any exudate or lesions. Age-appropriate dentition. Hearing appropriate Neck: normal, supple, no masses, no  thyromegaly Respiratory: clear to auscultation bilaterally, no wheezing, no crackles. Normal respiratory effort. No accessory muscle use.  Cardiovascular: Regular rate and rhythm, no murmurs / rubs / gallops. No extremity edema. 2+ pedal pulses. No carotid bruits.  Abdomen: Obese abdomen, no tenderness, no masses palpated, no hepatosplenomegaly. Bowel sounds positive.  Musculoskeletal: no clubbing / cyanosis. No joint deformity upper and lower extremities. no contractures, no atrophy. Normal muscle tone.  Decreased range of motion of the left upper extremity.  Left upper extremity is in a sling Skin: no rashes, lesions, ulcers. No induration Neurologic: Sensation intact. Strength 5/5 in all 4.  Psychiatric: Normal judgment and insight. Alert and oriented x 3. Normal mood.   EKG: independently reviewed, showing normal sinus rhythm with a rate of 65, QTc  436  Chest x-ray on Admission: I personally reviewed and I agree with radiologist reading as below.  DG Chest 1 View  Result Date: 10/12/2020 CLINICAL DATA:  TIA. EXAM: CHEST  1 VIEW COMPARISON:  07/19/2016 FINDINGS: Heart size upper normal. Negative for heart failure. Lungs are clear without infiltrate or effusion. IMPRESSION: No active disease. Electronically Signed   By: Franchot Gallo M.D.   On: 10/12/2020 11:37   CT Head Wo Contrast  Result Date: 10/12/2020 CLINICAL DATA:  TIA. EXAM: CT HEAD WITHOUT CONTRAST TECHNIQUE: Contiguous axial images were obtained from the base of the skull through the vertex without intravenous contrast. COMPARISON:  CT head 07/14/2020, MRI 07/15/2020 FINDINGS: Brain: Ventricle size and cerebral volume normal for age. Chronic infarct right parietal lobe unchanged. Patchy white matter hypodensity diffusely unchanged. Previously noted subtle hyperdensity in the left pons has resolved compatible with resolving hemorrhage. No new area of hemorrhage. No mass lesion. Vascular: Negative for hyperdense vessel. Atherosclerotic  calcification right middle cerebral artery. Skull: Negative Sinuses/Orbits: Paranasal sinuses clear. Bilateral cataract extraction Other: None IMPRESSION: No acute abnormality. Chronic ischemic changes in the white matter and right parietal lobe. Interval resolution of small hemorrhage in the left pons compared to the prior studies. Electronically Signed   By: Franchot Gallo M.D.   On: 10/12/2020 11:36   DG Shoulder Left  Result Date: 10/11/2020 CLINICAL DATA:  Left shoulder pain after fall. EXAM: LEFT SHOULDER - 2+ VIEW COMPARISON:  None. FINDINGS: No acute fracture or dislocation. Joint spaces are preserved. Small subacromial spur. Bone mineralization is normal. Soft tissues are unremarkable. IMPRESSION: 1. No acute osseous abnormality. Electronically Signed   By: Titus Dubin M.D.   On: 10/11/2020 18:07   Labs on Admission: I have personally reviewed following labs  CBC: Recent Labs  Lab 10/12/20 1106  WBC 8.7  NEUTROABS 6.7  HGB 12.1  HCT 37.5  MCV 94.0  PLT 562   Basic Metabolic Panel: Recent Labs  Lab 10/12/20 1106  NA 140  K 4.2  CL 106  CO2 26  GLUCOSE 121*  BUN 23  CREATININE 0.64  CALCIUM 8.9   GFR: Estimated Creatinine Clearance: 62.6 mL/min (by C-G formula based on SCr of 0.64 mg/dL).  Liver Function Tests: Recent Labs  Lab 10/12/20 1106  AST 21  ALT 16  ALKPHOS 58  BILITOT 0.6  PROT 6.8  ALBUMIN 3.8   Coagulation Profile: Recent Labs  Lab 10/12/20 1106  INR 1.0   Urine analysis:    Component Value Date/Time   COLORURINE YELLOW (A) 10/12/2020 1105   APPEARANCEUR HAZY (A) 10/12/2020 1105   APPEARANCEUR Clear 03/31/2019 1459   LABSPEC 1.015 10/12/2020 1105   LABSPEC 1.035 08/02/2014 2331   PHURINE 5.0 10/12/2020 1105   GLUCOSEU NEGATIVE 10/12/2020 1105   GLUCOSEU Negative 08/02/2014 2331   HGBUR SMALL (A) 10/12/2020 1105   BILIRUBINUR NEGATIVE 10/12/2020 1105   BILIRUBINUR Moderate 03/15/2020 1416   BILIRUBINUR Negative 03/31/2019 1459    BILIRUBINUR Negative 08/02/2014 2331   KETONESUR NEGATIVE 10/12/2020 1105   PROTEINUR NEGATIVE 10/12/2020 1105   UROBILINOGEN 0.2 03/15/2020 1416   UROBILINOGEN 1.0 08/20/2014 2125   NITRITE NEGATIVE 10/12/2020 1105   LEUKOCYTESUR NEGATIVE 10/12/2020 1105   LEUKOCYTESUR Negative 08/02/2014 2331   Josslyn Ciolek N Mkenzie Dotts D.O. [facility]  If 7PM-7AM, please contact overnight-coverage provider If 7AM-7PM, please contact day coverage provider [email or web address]  10/12/2020, 11:41 PM

## 2020-10-12 NOTE — ED Notes (Signed)
Helped walk to room toilet. Pt had steady gait. Back in bed. Unable to send urine sample at this time due to contamination with feces.

## 2020-10-12 NOTE — Telephone Encounter (Signed)
Daughter [name] Douglas called stating her mother [name] Douglas fell at [employer] yesterday and had to go to ED. She has messed her shoulder up and has to keep it in a sling. She is scheduled for TESI tomorrow and they are wondering if she can still come in for her procedure as she is in a lot of pain there as well. Please call patient's daughter with advise.

## 2020-10-12 NOTE — Telephone Encounter (Signed)
Spoke with daughter, Larene Beach and she cancelled procedure for tomorrow due to patient having stroke like symptoms and was in route to ED via ambulance.

## 2020-10-12 NOTE — ED Notes (Signed)
Was providing direct care to other urgent pt. Provided ice water to pt.

## 2020-10-12 NOTE — Consult Note (Signed)
NEURO HOSPITALIST CONSULT NOTE   Requestig physician: Dr. Tobie Poet  Reason for Consult: Acute onset of dysphasia and facial droop, now resolved   History obtained from:  Patient, Daughter and Chart     HPI:                                                                                                                                          [name] Douglas is an 80 y.o. female with a PMHx of chronic smoking, chronic pain syndrome, lumbar radiculitis and radiculopathy in the context of DDD and chronic L1 and T12 compression fractures, s/p recent  L1 and L2 transforaminal ESI + left L4-5 LESI for radiculopathic pain, COPD, elevated cholesterol, HTN, hypothyroidism, mild memory loss, PVD, atrial fibrillation (on Eliquis) and 3 prior strokes, the most recent in the Fall of 2021 (pontine hemorrhage), who presents after a transient spell of awake unresponsiveness and then slurred speech. She was seen in the [facility] ED yesterday for mechanical fall which occurred at [employer]. She landed in grass on her left shoulder, with resultant severe pain rated at 10/10. Of note, her left arm was injured and was placed in a sling, but shoulder films were negative. She returned today via EMS after AMS in conjunction with bilateral lower facial swelling and facial droop with slurred speech were noted by her daughter. Patient states that she felt very lightheaded and like she was going to pass out around the same time she noticed the speech difficulties. Symptoms began at about 1000 and had resolved at the time of EDP examination. She denies any numbness or weakness in her extremities. She also has no vision loss and does not endorse any vertigo.    Of note, she has been off her blood thinner (Elliquis) since Monday for upcoming left lower thoracic epidural steroid injection that was to be performed on Thursday.    Past Medical History:  Diagnosis Date  . Anxiety   . Arthritis    "shoulders"  (08/20/2014)  . Asthma   . Chronic lower back pain   . Complication of anesthesia    "agitated & restless after knee replacement in 2008"  . COPD (chronic obstructive pulmonary disease) ([place])   . DDD (degenerative disc disease), lumbar   . Depression   . Diverticulosis   . GERD (gastroesophageal reflux disease)   . High cholesterol   . Hypertension   . Hypothyroidism   . Memory loss    mild  . On home oxygen therapy    "2L at night" (08/20/2014)  . Peripheral vascular disease ([place])   . Pre-diabetes   . Shortness of breath dyspnea    with exertion  . Stroke ([place])    mild, no deficits    Past Surgical History:  Procedure Laterality Date  . CARDIAC CATHETERIZATION  ~ 2007  . EXCISIONAL HEMORRHOIDECTOMY  1960's  . EYE SURGERY Bilateral    Cataract extraction with IOL  . KNEE ARTHROSCOPY Left X 2 <2008  . KNEE ARTHROSCOPY WITH SUBCHONDROPLASTY Right 04/19/2016   Procedure: KNEE ARTHROSCOPY WITH SUBCHONDROPLASTY, PARTIAL MENISCECTOMY;  Surgeon: Hessie Knows, MD;  Location: [facility] ORS;  Service: Orthopedics;  Laterality: Right;  . LOOP RECORDER INSERTION N/A 01/02/2018   Procedure: LOOP RECORDER INSERTION;  Surgeon: Deboraha Sprang, MD;  Location: [facility];  Service: Cardiovascular;  Laterality: N/A;  . SHOULDER OPEN ROTATOR CUFF REPAIR Right 2001  . TONSILLECTOMY  ~ 1950  . TOTAL KNEE ARTHROPLASTY Left 2008  . TOTAL KNEE ARTHROPLASTY Right 07/03/2016   Procedure: TOTAL KNEE ARTHROPLASTY;  Surgeon: Hessie Knows, MD;  Location: [facility] ORS;  Service: Orthopedics;  Laterality: Right;  . TUBAL LIGATION  1973  . VAGINAL HYSTERECTOMY  1974    Family History  Problem Relation Age of Onset  . Diabetes Mother   . Hypertension Mother   . Lung cancer Mother   . Congestive Heart Failure Father   . Congestive Heart Failure Sister               Social History:  reports that she has been smoking cigarettes. She has a 13.25 pack-year smoking history. She has never used smokeless  tobacco. She reports that she does not drink alcohol and does not use drugs.  Allergies  Allergen Reactions  . Heparin Nausea And Vomiting    MEDICATIONS:                                                                                                                     No current facility-administered medications on file prior to encounter.   Current Outpatient Medications on File Prior to Encounter  Medication Sig Dispense Refill  . albuterol (VENTOLIN HFA) 108 (90 Base) MCG/ACT inhaler Inhale 2 puffs into the lungs every 6 (six) hours as needed for wheezing or shortness of breath. 8 g 1  . apixaban (ELIQUIS) 5 MG TABS tablet Take 1 tablet (5 mg total) by mouth 2 (two) times daily. (Patient taking differently: Take 5 mg by mouth daily.) 60 tablet 11  . atorvastatin (LIPITOR) 80 MG tablet Take 1 tablet (80 mg total) by mouth daily at 6 PM. 90 tablet 1  . Cyanocobalamin (VITAMIN B-12) 5000 MCG SUBL Place 1 tablet (5,000 mcg total) under the tongue daily. 30 tablet 2  . cyclobenzaprine (FLEXERIL) 5 MG tablet Take 1 tablet (5 mg total) by mouth at bedtime. 30 tablet 1  . docusate sodium (COLACE) 100 MG capsule Take 100 mg by mouth daily as needed for mild constipation.     Marland Kitchen donepezil (ARICEPT) 5 MG tablet Take 1 tablet (5 mg total) by mouth at bedtime. 90 tablet 1  . ergocalciferol (DRISDOL) 1.25 MG (50000 UT) capsule Take 1 capsule (50,000 Units total) by mouth every Friday. 4 capsule 3  . gabapentin (  NEURONTIN) 100 MG capsule Take 200 mg by mouth 3 (three) times daily.    Marland Kitchen HYDROcodone-acetaminophen (NORCO) 5-325 MG tablet Take 1 tablet by mouth 3 (three) times daily as needed for up to 5 days for moderate pain. 15 tablet 0  . levothyroxine (SYNTHROID) 75 MCG tablet Take 1 tablet (75 mcg total) by mouth daily before breakfast. 90 tablet 0  . losartan (COZAAR) 25 MG tablet Take 1 tablet (25 mg total) by mouth daily. 30 tablet 2  . metoprolol succinate (TOPROL-XL) 50 MG 24 hr tablet Take 2  tablets (100 mg total) by mouth daily. 180 tablet 0  . venlafaxine XR (EFFEXOR-XR) 75 MG 24 hr capsule TAKE 2 CAPSULES BY MOUTH DAILY. 180 capsule 0  . Cobalamin Combinations (B-12) [phone] MCG SUBL Place under the tongue. (Patient not taking: No sig reported)      ROS:                                                                                                                                       Denies fevers, cough, chest pain, or shortness of breath. Other ROS as per HPI.    Blood pressure (!) 151/53, pulse 64, temperature 97.9 F (36.6 C), temperature source Oral, resp. rate 15, height 5\' 4"  (1.626 m), weight 94.8 kg, SpO2 97 %.   General Examination:                                                                                                       Physical Exam  HEENT-  [place]/AT   Lungs- Respirations unlabored Extremities- LUE in sling from recent shoulder injury. No peripheral edema.   Neurological Examination Mental Status: Awake and alert. Oriented to year, month, city and state but not day ("Tuesday"). Speech is fluent with intact comprehension and naming. Pleasant and cooperative.  Cranial Nerves: II: Temporal visual fields intact without extinction to DSS. PERRL.   III,IV, VI: No ptosis. EOMI with saccadic quality of visual pursuits noted.  V,VII: Temp sensation equal bilaterally. Smile is symmetric.  VIII: Hearing intact to conversation IX,X: Palate rises symmetrically XI: Head is midline.  XII: Midline tongue extension Motor: Handgrips 5/5 bilaterally.  Finger dexterity with alternating fingers touching thumb of same hand: Normal and symmetric bilaterally. Deferred more proximal assessment due to left shoulder injury.  BLE 5/5 knee extension, knee flexion, ADF and APF bilaterally without asymmetry. Has back pain when attempting to straight leg raise at hip.  Sensory: Temp and light  touch intact x 4. No extinction to DSS.  Deep Tendon Reflexes: 1+ bilateral  patellae. Deferred upper extremities due to left shoulder injury.  Cerebellar: No ataxia with FNF on the right. Unable to assess on the left.  H-S normal bilaterally  Gait: Deferred   Lab Results: Basic Metabolic Panel: Recent Labs  Lab 10/12/20 1106  NA 140  K 4.2  CL 106  CO2 26  GLUCOSE 121*  BUN 23  CREATININE 0.64  CALCIUM 8.9    CBC: Recent Labs  Lab 10/12/20 1106  WBC 8.7  NEUTROABS 6.7  HGB 12.1  HCT 37.5  MCV 94.0  PLT 234    Cardiac Enzymes: No results for input(s): CKTOTAL, CKMB, CKMBINDEX, TROPONINI in the last 168 hours.  Lipid Panel: No results for input(s): CHOL, TRIG, HDL, CHOLHDL, VLDL, LDLCALC in the last 168 hours.  Imaging: DG Chest 1 View  Result Date: 10/12/2020 CLINICAL DATA:  TIA. EXAM: CHEST  1 VIEW COMPARISON:  07/19/2016 FINDINGS: Heart size upper normal. Negative for heart failure. Lungs are clear without infiltrate or effusion. IMPRESSION: No active disease. Electronically Signed   By: Franchot Gallo M.D.   On: 10/12/2020 11:37   CT Head Wo Contrast  Result Date: 10/12/2020 CLINICAL DATA:  TIA. EXAM: CT HEAD WITHOUT CONTRAST TECHNIQUE: Contiguous axial images were obtained from the base of the skull through the vertex without intravenous contrast. COMPARISON:  CT head 07/14/2020, MRI 07/15/2020 FINDINGS: Brain: Ventricle size and cerebral volume normal for age. Chronic infarct right parietal lobe unchanged. Patchy white matter hypodensity diffusely unchanged. Previously noted subtle hyperdensity in the left pons has resolved compatible with resolving hemorrhage. No new area of hemorrhage. No mass lesion. Vascular: Negative for hyperdense vessel. Atherosclerotic calcification right middle cerebral artery. Skull: Negative Sinuses/Orbits: Paranasal sinuses clear. Bilateral cataract extraction Other: None IMPRESSION: No acute abnormality. Chronic ischemic changes in the white matter and right parietal lobe. Interval resolution of small hemorrhage  in the left pons compared to the prior studies. Electronically Signed   By: Franchot Gallo M.D.   On: 10/12/2020 11:36   DG Shoulder Left  Result Date: 10/11/2020 CLINICAL DATA:  Left shoulder pain after fall. EXAM: LEFT SHOULDER - 2+ VIEW COMPARISON:  None. FINDINGS: No acute fracture or dislocation. Joint spaces are preserved. Small subacromial spur. Bone mineralization is normal. Soft tissues are unremarkable. IMPRESSION: 1. No acute osseous abnormality. Electronically Signed   By: Titus Dubin M.D.   On: 10/11/2020 18:07    Assessment: 80 year old female with acute onset of dysphasia and facial droop, now resolved. She has a history of 2 prior ischemic strokes, one small hemorrhagic pontine stroke and atrial fibrillation. She has been off of her Eliquis since Monday for a steroid injection which was planned for tomorrow.   1. Exam reveals no focal abnormalities.  2. CT head shows no acute abnormality. Old right parietal lobe cortically-based stroke is seen. Chronic ischemic changes in the white matter are mild. There has been interval resolution of a small hemorrhage in the left pons compared to the prior studies. 3. Has a loop recorder which daughter states is MRI compatible as the patient has had an MRI since the loop recorder was placed.  4. CTA of head and neck (March 2021): Atherosclerotic disease of both common carotid arteries with stenoses of up to 50% on both sides. Soft and calcified plaque at both carotid bifurcations. No measurable stenosis on the right. 40% stenosis of the proximal ICA on the left.  50-70% stenosis both vertebral artery origins. The vessels are widely patent beyond that however. Focal calcified plaque of the distal right M1 segment with stenosis estimated at 30-50%. 5. Overall presentation is most consistent with a TIA, most likely secondary to short term discontinuation of her anticoagulant.   Recommendations: 1. Can restart Eliquis at 5 mg BID. On Eliquis, her  risk reduction regarding stroke recurrence in the setting of atrial fibrillation is most likely higher than/offsets the increased risk of recurrent ICH.   2. MRI brain.  3. TTE 4. PT/OT/Speech 5. IVF 6. Modified permissive HTN protocol x 24 hours: Correct SBP if > 180.    Electronically signed: Dr. Kerney Elbe 10/12/2020, 1:19 PM

## 2020-10-12 NOTE — ED Notes (Signed)
Pt to CT

## 2020-10-12 NOTE — ED Notes (Signed)
Overdue meds not verified yet by pharmacy. Helped pt from bed to toilet; pt stating needs to have BM again. Pt daughter requesting IV morphine for pt for L shoulder and arm pain.

## 2020-10-12 NOTE — ED Triage Notes (Signed)
Pt to ED via [place] from home family called EMS c/o facial droop aned swelling around mouth had stroke in Nov 2021 Has seen at [facility] yesterday for witnessed fall, L arm was injured and in sling hx 2 previous strokes, a fib Off Eliquis for past 1 week for upcoming procedure Negative stroke screen, LVO negative per EMS CBG was 138 EDP at bedside Pt does not display weakness, facial asymmetry at this time Alert and oriented to self, place, time, situation

## 2020-10-12 NOTE — ED Notes (Signed)
Daughter at bedside. Pt back from CT.

## 2020-10-12 NOTE — ED Triage Notes (Signed)
Pt to ED via [address] from home family called EMS concerned that pt had facial droop, abnormal speech and swelling around mouth had stroke in Nov 2021 Has seen at [facility] yesterday for witnessed fall, L arm was injured and in sling hx 2 previous strokes, a fib Off Eliquis for past 1 week for upcoming procedure Negative stroke screen, LVO negative per EMS CBG was 138 EDP at bedside

## 2020-10-12 NOTE — ED Provider Notes (Signed)
[facility] Emergency Department Provider Note  ___________________________________________   Event Date/Time   First MD Initiated Contact with Patient 10/11/20 1633     (approximate)  I have reviewed the triage vital signs and the nursing notes.   HISTORY  Chief Complaint Left Arm Pain  HPI [name] Douglas is a 80 y.o. female who presents to the emergency department today for evaluation of left arm pain. Patient is with daughter, who presents following a fall. Patient states that she tripped on her shoe while leaving [employer], and fell on outstretched left arm. She denies hitting her head, denies loss of consciousness. She does report using blood thinners, but states that she has been off of them for the last 3 days in preparation for a steroid injection of the back. She reports all of her pain is in her proximal left arm. Patient is on chronic Norco, up to 3 times daily for her back pain. Pain is rated 10/10, no alleviating measures have been attempted.         Past Medical History:  Diagnosis Date  . Anxiety   . Arthritis    "shoulders" (08/20/2014)  . Asthma   . Chronic lower back pain   . Complication of anesthesia    "agitated & restless after knee replacement in 2008"  . COPD (chronic obstructive pulmonary disease) ([place])   . DDD (degenerative disc disease), lumbar   . Depression   . Diverticulosis   . GERD (gastroesophageal reflux disease)   . High cholesterol   . Hypertension   . Hypothyroidism   . Memory loss    mild  . On home oxygen therapy    "2L at night" (08/20/2014)  . Peripheral vascular disease ([place])   . Pre-diabetes   . Shortness of breath dyspnea    with exertion  . Stroke ([place])    mild, no deficits    Patient Active Problem List   Diagnosis Date Noted  . TIA (transient ischemic attack) 10/12/2020  . Chronic thoracic back pain (Left) 10/10/2020  . T12 compression fracture, sequela 10/10/2020  . Lumbar foraminal  stenosis (Bilateral: L2-3, L3-4, L4-5) (Right: L5-S1) (Severe left L4-5) 09/08/2020  . Grade 1 Lumbar Anterolisthesis (L3/4 and L4/5) 09/08/2020  . Grade 1 Lumbar Retrolisthesis (L2/3) 09/08/2020  . Protrusion of lumbar intervertebral disc (Multilevel) 09/08/2020  . Compression fracture of L1 lumbar vertebra, sequela 09/08/2020  . Other intervertebral disc degeneration, lumbar region 09/08/2020  . Lumbosacral facet syndrome (Multilevel) (Bilateral) 09/08/2020  . Lumbar facet hypertrophy (Multilevel) (Bilateral) 09/08/2020  . Ligamentum flavum hypertrophy 09/08/2020  . Lumbar lateral recess stenosis (Right: L2-3) 09/08/2020  . DDD (degenerative disc disease), lumbosacral 09/08/2020  . Compression fracture of L5 vertebra ([place]) 09/08/2020  . Osteoporosis with pathological fracture of lumbar vertebra ([place]) 09/08/2020  . Vitamin B12 deficiency 09/08/2020  . Chronic pain syndrome 09/07/2020  . Pharmacologic therapy 09/07/2020  . Disorder of skeletal system 09/07/2020  . Problems influencing health status 09/07/2020  . Uncomplicated opioid dependence ([place]) 09/07/2020  . Chronic low back pain (2ry area of Pain) (Bilateral) (R>L) w/ sciatica (Right) 09/07/2020  . Chronic lower extremity pain (1ry area of Pain) (Right) 09/07/2020  . Chronic lumbar radiculopathy (Right) 09/07/2020  . Lumbosacral radiculopathy at L2 (Right) 09/07/2020  . Lumbar radiculitis (L2 dermatome) (Right) 09/07/2020  . Unspecified inflammatory spondylopathy, lumbar region ([place]) 09/07/2020  . Chronic anticoagulation ([place]) 09/07/2020  . Abnormal MRI, lumbar spine (04/20/2020) 09/07/2020  . Intractable low back pain 08/03/2020  .  Fall down stairs 08/03/2020  . Hemorrhagic stroke ([place]) 08/03/2020  . CVA (cerebral vascular accident) ([place]) 07/14/2020  . Blurred vision 07/14/2020  . Lumbar central spinal stenosis (Moderate: L2-3  Severe: L3-4, L4-5) w/ neurogenic claudication 05/31/2020  . Muscle spasm 04/10/2020  .  Urinary tract infection with hematuria 04/10/2020  . Headache disorder 12/21/2019  . History of stroke 12/21/2019  . Dizziness 11/06/2019  . Atopic dermatitis 10/28/2019  . History of CVA (cerebrovascular accident) 10/07/2019  . Intractable episodic cluster headache 09/20/2019  . Other symptoms and signs involving the nervous system 09/20/2019  . Iron deficiency anemia 05/17/2019  . Vitamin D deficiency 05/17/2019  . Flu vaccine need 05/17/2019  . Encounter for general adult medical examination with abnormal findings 04/05/2019  . Nausea and vomiting 04/05/2019  . Dysuria 04/05/2019  . Need for vaccination against Streptococcus pneumoniae using pneumococcal conjugate vaccine 7 04/05/2019  . Occlusion and stenosis of bilateral carotid arteries 11/24/2017  . Subarachnoid hemorrhage ([place]) 12/09/2016  . Solitary pulmonary nodule 08/24/2016  . Primary localized osteoarthritis of right knee 07/03/2016  . Ileus ([place]) 04/24/2016  . Colitis due to Clostridium difficile   . Recurrent Clostridium difficile diarrhea   . Enteritis due to Clostridium difficile 08/22/2014  . Diarrhea 08/20/2014  . Essential hypertension 08/20/2014  . Hyperlipidemia 08/20/2014  . Hypothyroidism 08/20/2014  . Tobacco abuse 08/20/2014  . COPD (chronic obstructive pulmonary disease) ([place]) 08/20/2014  . Acute diarrhea 07/27/2014    Past Surgical History:  Procedure Laterality Date  . CARDIAC CATHETERIZATION  ~ 2007  . EXCISIONAL HEMORRHOIDECTOMY  1960's  . EYE SURGERY Bilateral    Cataract extraction with IOL  . KNEE ARTHROSCOPY Left X 2 <2008  . KNEE ARTHROSCOPY WITH SUBCHONDROPLASTY Right 04/19/2016   Procedure: KNEE ARTHROSCOPY WITH SUBCHONDROPLASTY, PARTIAL MENISCECTOMY;  Surgeon: Hessie Knows, MD;  Location: [facility] ORS;  Service: Orthopedics;  Laterality: Right;  . LOOP RECORDER INSERTION N/A 01/02/2018   Procedure: LOOP RECORDER INSERTION;  Surgeon: Deboraha Sprang, MD;  Location: [facility];   Service: Cardiovascular;  Laterality: N/A;  . SHOULDER OPEN ROTATOR CUFF REPAIR Right 2001  . TONSILLECTOMY  ~ 1950  . TOTAL KNEE ARTHROPLASTY Left 2008  . TOTAL KNEE ARTHROPLASTY Right 07/03/2016   Procedure: TOTAL KNEE ARTHROPLASTY;  Surgeon: Hessie Knows, MD;  Location: [facility] ORS;  Service: Orthopedics;  Laterality: Right;  . TUBAL LIGATION  1973  . VAGINAL HYSTERECTOMY  1974    Prior to Admission medications   Medication Sig Start Date End Date Taking? Authorizing Provider  HYDROcodone-acetaminophen (NORCO) 5-325 MG tablet Take 1 tablet by mouth 3 (three) times daily as needed for up to 5 days for moderate pain. 10/11/20 10/16/20 Yes Samil Mecham, Farrel Gordon, PA  albuterol (VENTOLIN HFA) 108 (90 Base) MCG/ACT inhaler Inhale 2 puffs into the lungs every 6 (six) hours as needed for wheezing or shortness of breath. 08/05/20   Shelly Coss, MD  apixaban (ELIQUIS) 5 MG TABS tablet Take 1 tablet (5 mg total) by mouth 2 (two) times daily. Patient taking differently: Take 5 mg by mouth daily. 09/13/20   Deboraha Sprang, MD  atorvastatin (LIPITOR) 80 MG tablet Take 1 tablet (80 mg total) by mouth daily at 6 PM. 08/22/20   Boscia, Greer Ee, NP  Cobalamin Combinations (B-12) [phone] MCG SUBL Place under the tongue. Patient not taking: No sig reported    [provider]  Cyanocobalamin (VITAMIN B-12) 5000 MCG SUBL Place 1 tablet (5,000 mcg total) under the tongue daily.  09/08/20 12/07/20  Milinda Pointer, MD  cyclobenzaprine (FLEXERIL) 5 MG tablet Take 1 tablet (5 mg total) by mouth at bedtime. 06/01/20   Ronnell Freshwater, NP  docusate sodium (COLACE) 100 MG capsule Take 100 mg by mouth daily as needed for mild constipation.     [provider]  donepezil (ARICEPT) 5 MG tablet Take 1 tablet (5 mg total) by mouth at bedtime. 04/28/20   Ronnell Freshwater, NP  ergocalciferol (DRISDOL) 1.25 MG (50000 UT) capsule Take 1 capsule (50,000 Units total) by mouth every Friday. 07/29/20   Lavera Guise, MD  gabapentin (NEURONTIN) 100 MG capsule Take 200 mg by mouth 3 (three) times daily. 06/24/20   [provider]  levothyroxine (SYNTHROID) 75 MCG tablet Take 1 tablet (75 mcg total) by mouth daily before breakfast. 08/17/20   Ronnell Freshwater, NP  losartan (COZAAR) 25 MG tablet Take 1 tablet (25 mg total) by mouth daily. 04/28/20   Ronnell Freshwater, NP  metoprolol succinate (TOPROL-XL) 50 MG 24 hr tablet Take 2 tablets (100 mg total) by mouth daily. 09/28/20   Lavera Guise, MD  venlafaxine XR (EFFEXOR-XR) 75 MG 24 hr capsule TAKE 2 CAPSULES BY MOUTH DAILY. 09/28/20   Lavera Guise, MD    Allergies Heparin  Family History  Problem Relation Age of Onset  . Diabetes Mother   . Hypertension Mother   . Lung cancer Mother   . Congestive Heart Failure Father   . Congestive Heart Failure Sister     Social History Social History   Tobacco Use  . Smoking status: Current Some Day Smoker    Packs/day: 0.25    Years: 53.00    Pack years: 13.25    Types: Cigarettes  . Smokeless tobacco: Never Used  . Tobacco comment: 3 cigarettes in week  Vaping Use  . Vaping Use: Never used  Substance Use Topics  . Alcohol use: No  . Drug use: No    Review of Systems Constitutional: No fever/chills Eyes: No visual changes. ENT: No sore throat. Cardiovascular: Denies chest pain. Respiratory: Denies shortness of breath. Gastrointestinal: No abdominal pain.  No nausea, no vomiting.  No diarrhea.  No constipation. Genitourinary: Negative for dysuria. Musculoskeletal: + Left shoulder pain, Skin: Negative for rash. Neurological: Negative for headaches, focal weakness or numbness.   ____________________________________________   PHYSICAL EXAM:  VITAL SIGNS: ED Triage Vitals  Enc Vitals Group     BP 10/11/20 1624 (!) 152/62     Pulse Rate 10/11/20 1624 77     Resp 10/11/20 1624 20     Temp 10/11/20 1624 98.4 F (36.9 C)     Temp Source 10/11/20 1624 Oral     SpO2 10/11/20 1624 96  %     Weight --      Height --      Head Circumference --      Peak Flow --      Pain Score 10/11/20 1615 10     Pain Loc --      Pain Edu? --      Excl. in [place]? --    Constitutional: Alert and oriented. Well appearing and in no acute distress. Eyes: Conjunctivae are normal. PERRL. EOMI. Head: Atraumatic. Nose: No congestion/rhinnorhea. Mouth/Throat: Mucous membranes are moist.   Neck: No stridor. No tenderness to palpation of the midline or paraspinals of the cervical spine. Cardiovascular: Normal rate, regular rhythm. Grossly normal heart sounds.  Good peripheral circulation. Respiratory: Normal respiratory  effort.  No retractions. Lungs CTAB. Gastrointestinal: Soft and nontender. No distention. No abdominal bruits. No CVA tenderness. Musculoskeletal: There is a great deal of tenderness to the proximal humerus that extends into the upper trapezius and periscapular region. No tenderness to palpation about the elbow. No tenderness to palpation of the clavicle until at the far distal range. Patient has extremely limited range of motion of the shoulder and elbow secondary to pain in the shoulder. Radial pulse 2+, able to move the wrist and digits without deficit. Neurologic:  Normal speech and language. No gross focal neurologic deficits are appreciated.  Skin:  Skin is warm, dry and intact. No rash noted. Psychiatric: Mood and affect are normal. Speech and behavior are normal.  ____________________________________________  RADIOLOGY I, Marlana Salvage, personally viewed and evaluated these images (plain radiographs) as part of my medical decision making, as well as reviewing the written report by the radiologist.  ED provider interpretation: X-rays of the left shoulder reveal no obvious acute fracture, significant osteopenia noted  ____________________________________________   INITIAL IMPRESSION / ASSESSMENT AND PLAN / ED COURSE  As part of my medical decision making, I reviewed  the following data within the [place] notes reviewed and incorporated, Radiograph reviewed and Notes from prior ED visits        Patient is an 80 year old female who presents to the emergency department for evaluation of left arm pain following a mechanical fall in the parking lot of [employer]. See HPI for further details. In triage, the patient is mildly hypertensive but otherwise has normal vital signs. On physical exam, she has a significant amount of tenderness to the proximal left humerus and significant decrease in range of motion secondary to 10/10 pain. She remains neurovascularly intact. X-rays are negative for any obvious acute fracture, however the patient significant osteopenia may limit early detection of fracture. Differentials considered at this time include proximal humerus fracture, other fracture of the shoulder region, rotator cuff injury or tear, other musculoskeletal injury. Given the patient's significant amount of pain, have high suspicion for occult fracture. We will place the patient in a shoulder immobilizer with close follow-up with orthopedics. Patient states that she only has 3 tablets left of her chronic Norco, and will give her a short supply of this until she can reach back out to her long-term prescriber. Patient and daughter are amenable with this plan, she stable this time for outpatient follow-up.      ____________________________________________   FINAL CLINICAL IMPRESSION(S) / ED DIAGNOSES  Final diagnoses:  Acute pain of left shoulder     ED Discharge Orders         Ordered    HYDROcodone-acetaminophen (NORCO) 5-325 MG tablet  3 times daily PRN       Note to Pharmacy: Please deliver to patient.   10/11/20 1831          *Please note:  [name] Douglas was evaluated in Emergency Department on 10/12/2020 for the symptoms described in the history of present illness. She was evaluated in the context of the global COVID-19  pandemic, which necessitated consideration that the patient might be at risk for infection with the SARS-CoV-2 virus that causes COVID-19. Institutional protocols and algorithms that pertain to the evaluation of patients at risk for COVID-19 are in a state of rapid change based on information released by regulatory bodies including the CDC and federal and state organizations. These policies and algorithms were followed during the patient's care in  the ED.  Some ED evaluations and interventions may be delayed as a result of limited staffing during and the pandemic.*   Note:  This document was prepared using Dragon voice recognition software and may include unintentional dictation errors.   Marlana Salvage, PA 10/12/20 Dionicia Abler    Merlyn Lot, MD 10/14/20 1245

## 2020-10-12 NOTE — ED Notes (Signed)
BP cuff changed to larger cuff with last set VS.

## 2020-10-13 ENCOUNTER — Observation Stay: Payer: Medicare Other

## 2020-10-13 ENCOUNTER — Observation Stay (HOSPITAL_BASED_OUTPATIENT_CLINIC_OR_DEPARTMENT_OTHER)
Admit: 2020-10-13 | Discharge: 2020-10-13 | Disposition: A | Discharge status: Home / Self Care | Payer: Medicare Other | Attending: Internal Medicine | Admitting: Internal Medicine

## 2020-10-13 ENCOUNTER — Ambulatory Visit: Payer: Medicare Other | Admitting: Pain Medicine

## 2020-10-13 ENCOUNTER — Other Ambulatory Visit: Payer: Self-pay

## 2020-10-13 DIAGNOSIS — I10 Essential (primary) hypertension: Secondary | ICD-10-CM | POA: Diagnosis not present

## 2020-10-13 DIAGNOSIS — G459 Transient cerebral ischemic attack, unspecified: Secondary | ICD-10-CM | POA: Diagnosis not present

## 2020-10-13 DIAGNOSIS — U071 COVID-19: Secondary | ICD-10-CM

## 2020-10-13 DIAGNOSIS — I739 Peripheral vascular disease, unspecified: Secondary | ICD-10-CM | POA: Diagnosis not present

## 2020-10-13 DIAGNOSIS — I613 Nontraumatic intracerebral hemorrhage in brain stem: Secondary | ICD-10-CM | POA: Diagnosis not present

## 2020-10-13 DIAGNOSIS — Z8673 Personal history of transient ischemic attack (TIA), and cerebral infarction without residual deficits: Secondary | ICD-10-CM | POA: Diagnosis not present

## 2020-10-13 LAB — HEMOGLOBIN A1C
Hgb A1c MFr Bld: 6.5 % — ABNORMAL HIGH (ref 4.8–5.6)
Mean Plasma Glucose: 139.85 mg/dL

## 2020-10-13 LAB — ECHOCARDIOGRAM COMPLETE
Height: 64 in
S' Lateral: 2.8 cm
Weight: 3343.94 oz

## 2020-10-13 LAB — CBC
HCT: 35.3 % — ABNORMAL LOW (ref 36.0–46.0)
Hemoglobin: 11.6 g/dL — ABNORMAL LOW (ref 12.0–15.0)
MCH: 30.4 pg (ref 26.0–34.0)
MCHC: 32.9 g/dL (ref 30.0–36.0)
MCV: 92.4 fL (ref 80.0–100.0)
Platelets: 210 10*3/uL (ref 150–400)
RBC: 3.82 MIL/uL — ABNORMAL LOW (ref 3.87–5.11)
RDW: 13.5 % (ref 11.5–15.5)
WBC: 7.1 10*3/uL (ref 4.0–10.5)
nRBC: 0 % (ref 0.0–0.2)

## 2020-10-13 LAB — BASIC METABOLIC PANEL
Anion gap: 7 (ref 5–15)
BUN: 17 mg/dL (ref 8–23)
CO2: 26 mmol/L (ref 22–32)
Calcium: 8.9 mg/dL (ref 8.9–10.3)
Chloride: 106 mmol/L (ref 98–111)
Creatinine, Ser: 0.68 mg/dL (ref 0.44–1.00)
GFR, Estimated: >60 mL/min (ref >60)
Glucose, Bld: 104 mg/dL — ABNORMAL HIGH (ref 70–99)
Potassium: 3.7 mmol/L (ref 3.5–5.1)
Sodium: 139 mmol/L (ref 135–145)

## 2020-10-13 LAB — LIPID PANEL
Cholesterol: 147 mg/dL (ref 0–200)
HDL: 57 mg/dL (ref >40)
LDL Cholesterol: 77 mg/dL (ref 0–99)
Total CHOL/HDL Ratio: 2.6 RATIO
Triglycerides: 64 mg/dL (ref <150)
VLDL: 13 mg/dL (ref 0–40)

## 2020-10-13 LAB — SARS CORONAVIRUS 2 (TAT 6-24 HRS): SARS Coronavirus 2: POSITIVE — AB

## 2020-10-13 MED ORDER — METOPROLOL SUCCINATE ER 100 MG PO TB24
100.0000 mg | ORAL_TABLET | Freq: Every day | ORAL | Status: DC
  Administered 2020-10-13: 100 mg via ORAL
  Filled 2020-10-13: qty 1

## 2020-10-13 MED ORDER — LOSARTAN POTASSIUM 25 MG PO TABS
25.0000 mg | ORAL_TABLET | Freq: Every day | ORAL | Status: DC
  Administered 2020-10-13: 25 mg via ORAL
  Filled 2020-10-13: qty 1

## 2020-10-13 MED ORDER — HYDRALAZINE HCL 25 MG PO TABS
25.0000 mg | ORAL_TABLET | Freq: Once | ORAL | Status: AC
  Administered 2020-10-13: 25 mg via ORAL
  Filled 2020-10-13: qty 1

## 2020-10-13 NOTE — Progress Notes (Signed)
Pt BP 186/57-MD made aware

## 2020-10-13 NOTE — Discharge Summary (Signed)
Physician Discharge Summary  [name] Douglas PXT:[id number] DOB: Aug 09, 1941 DOA: 10/12/2020  PCP: Lavera Guise, MD  Admit date: 10/12/2020 Discharge date: 10/13/2020  Admitted From: Home Disposition:  Home  Recommendations for Outpatient Follow-up:  1. Follow up with PCP in 1-2 weeks 2. Please obtain BMP/CBC in one week 3. Please follow up on the following pending results:None  Home Health:Yes Equipment/Devices: Rolling walker Discharge Condition: Stable CODE STATUS: Partial/DNI Diet recommendation: Heart Healthy  Brief/Interim Summary: [name] Douglas is a 80 y.o. female with medical history significant for anxiety, asthma, COPD, hypertension, hyperlipidemia, hypothyroid, chronic low back pain from LV vertebral fracture in the past, history of 3 strokes, presents to the emergency department for chief concerns of strokelike symptoms.  Per patient and daughter at bedside, patient endorses feeling hot, dizziness and felt clammy. Daughter said patient's eyes glazed over and slow to respond. Patient was slow to respond with words but understandable.  Daughter denies loss of consciousness.  Daughter states this episode lasted approximately 30 minutes and has completely resolved.  Daughter states that this was similar to November 2021 episode.   He was admitted for stroke work-up, imaging which includes CT head and MRI were negative.  It had any focal neurologic deficit.  Her A1c was at 6.5, which keeps her still at prediabetic range.  She has continued to complain about left shoulder pain since her fall.  Multiple imaging were negative for any acute fractures.  She wants to see an orthopedic and advised to follow-up with them as an outpatient.  Patient was also found to have positive for COVID-19.  No upper respiratory symptoms.  Remained afebrile.  Chest x-ray normal.  Patient received vaccination with 2 doses and no booster.  She was given a referral for outpatient Covid clinic for a  possible monoclonal antibody or further management due to her risk factors and age.  Continue with rest of her home medications and follow-up with her primary care provider.  Discharge Diagnoses:  Principal Problem:   TIA (transient ischemic attack) Active Problems:   Essential hypertension   Hyperlipidemia   Hypothyroidism   COPD (chronic obstructive pulmonary disease) (HCC)   Chronic pain syndrome   Chronic lumbar radiculopathy (Right)   DDD (degenerative disc disease), lumbosacral   Chronic thoracic back pain (Left)   Left shoulder pain  Discharge Instructions  Discharge Instructions    Diet - low sodium heart healthy   Complete by: As directed    Discharge instructions   Complete by: As directed    It was pleasure taking care of you. As we discussed, please contact your primary care provider to receive either monoclonal antibodies or this new antiviral medication.  These medications are only approved as an outpatient.  Keep your self quarantine for 10 days, if you experience any respiratory symptoms contact your primary care provider. As we discussed there is no fracture in your shoulder, if you want to see an orthopedic your primary care provider should be able to make an appointment and give you a referral.   Increase activity slowly   Complete by: As directed      Allergies as of 10/13/2020      Reactions   Heparin Nausea And Vomiting      Medication List    STOP taking these medications   B-12 [phone] MCG Subl     TAKE these medications   albuterol 108 (90 Base) MCG/ACT inhaler Commonly known as: VENTOLIN HFA Inhale 2 puffs into the lungs  every 6 (six) hours as needed for wheezing or shortness of breath.   apixaban 5 MG Tabs tablet Commonly known as: Eliquis Take 1 tablet (5 mg total) by mouth 2 (two) times daily. What changed: when to take this   atorvastatin 80 MG tablet Commonly known as: LIPITOR Take 1 tablet (80 mg total) by mouth daily at 6 PM.    cyclobenzaprine 5 MG tablet Commonly known as: FLEXERIL Take 1 tablet (5 mg total) by mouth at bedtime.   docusate sodium 100 MG capsule Commonly known as: COLACE Take 100 mg by mouth daily as needed for mild constipation.   donepezil 5 MG tablet Commonly known as: ARICEPT Take 1 tablet (5 mg total) by mouth at bedtime.   ergocalciferol 1.25 MG (50000 UT) capsule Commonly known as: Drisdol Take 1 capsule (50,000 Units total) by mouth every Friday.   gabapentin 100 MG capsule Commonly known as: NEURONTIN Take 200 mg by mouth 3 (three) times daily.   HYDROcodone-acetaminophen 5-325 MG tablet Commonly known as: Norco Take 1 tablet by mouth 3 (three) times daily as needed for up to 5 days for moderate pain.   levothyroxine 75 MCG tablet Commonly known as: SYNTHROID Take 1 tablet (75 mcg total) by mouth daily before breakfast.   losartan 25 MG tablet Commonly known as: Cozaar Take 1 tablet (25 mg total) by mouth daily.   metoprolol succinate 50 MG 24 hr tablet Commonly known as: TOPROL-XL Take 2 tablets (100 mg total) by mouth daily.   venlafaxine XR 75 MG 24 hr capsule Commonly known as: EFFEXOR-XR TAKE 2 CAPSULES BY MOUTH DAILY.   Vitamin B-12 5000 MCG Subl Place 1 tablet (5,000 mcg total) under the tongue daily.       Follow-up Information    Lavera Guise, MD. Schedule an appointment as soon as possible for a visit.   Specialty: Internal Medicine Contact information: [address] [place] [address] [phone]              Allergies  Allergen Reactions  . Heparin Nausea And Vomiting    Consultations:  Neurology  Procedures/Studies: DG Chest 1 View  Result Date: 10/12/2020 CLINICAL DATA:  TIA. EXAM: CHEST  1 VIEW COMPARISON:  07/19/2016 FINDINGS: Heart size upper normal. Negative for heart failure. Lungs are clear without infiltrate or effusion. IMPRESSION: No active disease. Electronically Signed   By: Franchot Gallo M.D.   On: 10/12/2020  11:37   DG Thoracic Spine 2 View  Result Date: 10/10/2020 CLINICAL DATA:  Upper back and or thoracic spine pain. Pain for 1 year. No known injury. EXAM: THORACIC SPINE 2 VIEWS COMPARISON:  Thoracic spine radiograph 08/03/2020, lumbar CT 08/03/2020 FINDINGS: The alignment is maintained. Remote L1 superior endplate compression deformity is unchanged from prior. Slight anterior wedging of T12 vertebra is also chronic. Remaining vertebral body heights are preserved. No evidence of fracture or destructive bone lesion. Posterior elements appear intact. There is no paravertebral soft tissue abnormality. IMPRESSION: 1. Remote L1 superior endplate compression fracture, unchanged from prior. 2. No acute findings or destructive bone lesions. Electronically Signed   By: Keith Rake M.D.   On: 10/10/2020 17:59   CT Head Wo Contrast  Result Date: 10/12/2020 CLINICAL DATA:  TIA. EXAM: CT HEAD WITHOUT CONTRAST TECHNIQUE: Contiguous axial images were obtained from the base of the skull through the vertex without intravenous contrast. COMPARISON:  CT head 07/14/2020, MRI 07/15/2020 FINDINGS: Brain: Ventricle size and cerebral volume normal for age. Chronic infarct right  parietal lobe unchanged. Patchy white matter hypodensity diffusely unchanged. Previously noted subtle hyperdensity in the left pons has resolved compatible with resolving hemorrhage. No new area of hemorrhage. No mass lesion. Vascular: Negative for hyperdense vessel. Atherosclerotic calcification right middle cerebral artery. Skull: Negative Sinuses/Orbits: Paranasal sinuses clear. Bilateral cataract extraction Other: None IMPRESSION: No acute abnormality. Chronic ischemic changes in the white matter and right parietal lobe. Interval resolution of small hemorrhage in the left pons compared to the prior studies. Electronically Signed   By: Franchot Gallo M.D.   On: 10/12/2020 11:36   MR BRAIN WO CONTRAST  Result Date: 10/13/2020 CLINICAL DATA:   Transient ischemic attack EXAM: MRI HEAD WITHOUT CONTRAST TECHNIQUE: Multiplanar, multiecho pulse sequences of the brain and surrounding structures were obtained without intravenous contrast. COMPARISON:  07/15/2020 FINDINGS: Brain: No acute infarct, mass effect or extra-axial collection. Multifocal chronic blood products. This includes left pontine chronic microhemorrhage. There is also an old right parietal infarct. Hyperintense T2-weighted signal is moderately widespread throughout the white matter. Generalized volume loss without a clear lobar predilection. The midline structures are normal. Vascular: Major flow voids are preserved. Skull and upper cervical spine: Normal calvarium and skull base. Visualized upper cervical spine and soft tissues are normal. Sinuses/Orbits:No paranasal sinus fluid levels or advanced mucosal thickening. No mastoid or middle ear effusion. Normal orbits. IMPRESSION: 1. No acute intracranial abnormality. 2. Old right parietal infarct and findings of chronic small vessel disease. Electronically Signed   By: Ulyses Jarred M.D.   On: 10/13/2020 01:53   DG Bone Density  Result Date: 09/20/2020 EXAM: DUAL X-RAY ABSORPTIOMETRY (DXA) FOR BONE MINERAL DENSITY IMPRESSION: Your patient Marium Ragan completed a BMD test on 09/20/2020 using the [place] (software version: 14.10) manufactured by [employer]. The following summarizes the results of our evaluation. Technologist:VLM PATIENT BIOGRAPHICAL: Name: Kimberli, Winne Patient ID: [id number] Birth Date: Dec 21, 1940 Height: 64.5 in. Gender: Female Exam Date: 09/20/2020 Weight: 213.0 lbs. Indications: Caucasian, COPD, Height Loss, Hysterectomy Fractures: Spinal compression fx Treatments: Albuterol, Vitamin D DENSITOMETRY RESULTS: Site         Region     Measured Date Measured Age WHO Classification Young Adult T-score BMD         %Change vs. Previous Significant Change (*) Left Forearm Radius 33% 09/20/2020 [age]  Osteopenia -2.0 0.705 g/cm2 - - DualFemur Neck Left 09/20/2020 80.9 Osteopenia -2.2 0.731 g/cm2 - - ASSESSMENT: The BMD measured at Femur Neck Left is 0.731 g/cm2 with a T-score of -2.2. This patient is considered osteopenic according to [facility]) criteria. Lumbar spine was not utilized due to advanced degenerative changes and compression fractures. The scan quality is good. World Pharmacologist [facility]) criteria for post-menopausal, Caucasian Women: Normal:                   T-score at or above -1 SD Osteopenia/low bone mass: T-score between -1 and -2.5 SD Osteoporosis:             T-score at or below -2.5 SD RECOMMENDATIONS: 1. All patients should optimize calcium and vitamin D intake. 2. Consider FDA-approved medical therapies in postmenopausal women and men aged 8 years and older, based on the following: a. A hip or vertebral(clinical or morphometric) fracture b. T-score < -2.5 at the femoral neck or spine after appropriate evaluation to exclude secondary causes c. Low bone mass (T-score between -1.0 and -2.5 at the femoral neck or spine) and a 10-year probability of a  hip fracture > 3% or a 10-year probability of a major osteoporosis-related fracture > 20% based on the [place]-adapted WHO algorithm 3. Clinician judgment and/or patient preferences may indicate treatment for people with 10-year fracture probabilities above or below these levels FOLLOW-UP: People with diagnosed cases of osteoporosis or at high risk for fracture should have regular bone mineral density tests. For patients eligible for Medicare, routine testing is allowed once every 2 years. The testing frequency can be increased to one year for patients who have rapidly progressing disease, those who are receiving or discontinuing medical therapy to restore bone mass, or have additional risk factors. FRAX* RESULTS:  (version: 3.5) 10-year Probability of Fracture1 Major Osteoporotic Fracture2 Hip Fracture 15.6% 4.8% Population:  [place] (Caucasian) Risk Factors: None Based on Femur (Left) Neck BMD 1 -The 10-year probability of fracture may be lower than reported if the patient has received treatment. 2 -Major Osteoporotic Fracture: Clinical Spine, Forearm, Hip or Shoulder *FRAX is a Materials engineer of the [employer] for Metabolic Bone Disease, a [place] (WHO) [facility]. ASSESSMENT: The probability of a major osteoporotic fracture is 15.6% within the next ten years. The probability of a hip fracture is 4.8% within the next ten years. Electronically Signed   By: Marlaine Hind M.D.   On: 09/20/2020 10:15   DG Shoulder Left  Result Date: 10/11/2020 CLINICAL DATA:  Left shoulder pain after fall. EXAM: LEFT SHOULDER - 2+ VIEW COMPARISON:  None. FINDINGS: No acute fracture or dislocation. Joint spaces are preserved. Small subacromial spur. Bone mineralization is normal. Soft tissues are unremarkable. IMPRESSION: 1. No acute osseous abnormality. Electronically Signed   By: Titus Dubin M.D.   On: 10/11/2020 18:07   DG PAIN CLINIC C-ARM 1-60 MIN NO REPORT  Result Date: 09/27/2020 Fluoro was used, but no Radiologist interpretation will be provided. Please refer to "NOTES" tab for provider progress note.    Subjective: Patient is having some left shoulder pain since her fall.  Denies any cough, congestion or shortness of breath.  No chest pain.  She received 2 doses of vaccine, no booster yet.  Denies any sick contacts.  Discharge Exam: Vitals:   10/13/20 0927 10/13/20 1217  BP: (!) 164/45 (!) 186/57  Pulse: 72 74  Resp:  18  Temp: 98.5 F (36.9 C)   SpO2: 99% 96%   Vitals:   10/13/20 0527 10/13/20 0618 10/13/20 0927 10/13/20 1217  BP: (!) 148/61  (!) 164/45 (!) 186/57  Pulse: 72  72 74  Resp: 20   18  Temp: 98.6 F (37 C)  98.5 F (36.9 C)   TempSrc:   Oral   SpO2: 100%  99% 96%  Weight:  94.8 kg    Height:        General: Pt is alert, awake, not in acute  distress Cardiovascular: RRR, S1/S2 +, no rubs, no gallops Respiratory: CTA bilaterally, no wheezing, no rhonchi Abdominal: Soft, NT, ND, bowel sounds + Extremities: no edema, no cyanosis   The results of significant diagnostics from this hospitalization (including imaging, microbiology, ancillary and laboratory) are listed below for reference.    Microbiology: Recent Results (from the past 240 hour(s))  SARS CORONAVIRUS 2 (TAT 6-24 HRS) Nasopharyngeal Nasopharyngeal Swab     Status: Abnormal   Collection Time: 10/12/20  1:30 PM   Specimen: Nasopharyngeal Swab  Result Value Ref Range Status   SARS Coronavirus 2 POSITIVE (A) NEGATIVE Final    Comment: (NOTE) SARS-CoV-2 target nucleic  acids are DETECTED.  The SARS-CoV-2 RNA is generally detectable in upper and lower respiratory specimens during the acute phase of infection. Positive results are indicative of the presence of SARS-CoV-2 RNA. Clinical correlation with patient history and other diagnostic information is  necessary to determine patient infection status. Positive results do not rule out bacterial infection or co-infection with other viruses.  The expected result is Negative.  Fact Sheet for Patients: SugarRoll.be  Fact Sheet for Healthcare Providers: https://www.woods-mathews.com/  This test is not yet approved or cleared by the [place] FDA and  has been authorized for detection and/or diagnosis of SARS-CoV-2 by FDA under an Emergency Use Authorization (EUA). This EUA will remain  in effect (meaning this test can be used) for the duration of the COVID-19 declaration under Section 564(b)(1) of the Act, 21 U. S.C. section 360bbb-3(b)(1), unless the authorization is terminated or revoked sooner.   Performed at [facility], [place] [address], [place],  [address]      Labs: BNP (last 3 results) No results for input(s): BNP in the last 8760 hours. Basic  Metabolic Panel: Recent Labs  Lab 10/12/20 1106 10/13/20 0442  NA 140 139  K 4.2 3.7  CL 106 106  CO2 26 26  GLUCOSE 121* 104*  BUN 23 17  CREATININE 0.64 0.68  CALCIUM 8.9 8.9   Liver Function Tests: Recent Labs  Lab 10/12/20 1106  AST 21  ALT 16  ALKPHOS 58  BILITOT 0.6  PROT 6.8  ALBUMIN 3.8   No results for input(s): LIPASE, AMYLASE in the last 168 hours. No results for input(s): AMMONIA in the last 168 hours. CBC: Recent Labs  Lab 10/12/20 1106 10/13/20 0442  WBC 8.7 7.1  NEUTROABS 6.7  --   HGB 12.1 11.6*  HCT 37.5 35.3*  MCV 94.0 92.4  PLT 234 210   Cardiac Enzymes: No results for input(s): CKTOTAL, CKMB, CKMBINDEX, TROPONINI in the last 168 hours. BNP: Invalid input(s): POCBNP CBG: No results for input(s): GLUCAP in the last 168 hours. D-Dimer No results for input(s): DDIMER in the last 72 hours. Hgb A1c Recent Labs    10/13/20 0442  HGBA1C 6.5*   Lipid Profile Recent Labs    10/13/20 0442  CHOL 147  HDL 57  LDLCALC 77  TRIG 64  CHOLHDL 2.6   Thyroid function studies No results for input(s): TSH, T4TOTAL, T3FREE, THYROIDAB in the last 72 hours.  Invalid input(s): FREET3 Anemia work up No results for input(s): VITAMINB12, FOLATE, FERRITIN, TIBC, IRON, RETICCTPCT in the last 72 hours. Urinalysis    Component Value Date/Time   COLORURINE YELLOW (A) 10/12/2020 1105   APPEARANCEUR HAZY (A) 10/12/2020 1105   APPEARANCEUR Clear 03/31/2019 1459   LABSPEC 1.015 10/12/2020 1105   LABSPEC 1.035 08/02/2014 2331   PHURINE 5.0 10/12/2020 1105   GLUCOSEU NEGATIVE 10/12/2020 1105   GLUCOSEU Negative 08/02/2014 2331   HGBUR SMALL (A) 10/12/2020 1105   BILIRUBINUR NEGATIVE 10/12/2020 1105   BILIRUBINUR Moderate 03/15/2020 1416   BILIRUBINUR Negative 03/31/2019 1459   BILIRUBINUR Negative 08/02/2014 2331   KETONESUR NEGATIVE 10/12/2020 1105   PROTEINUR NEGATIVE 10/12/2020 1105   UROBILINOGEN 0.2 03/15/2020 1416   UROBILINOGEN 1.0 08/20/2014  2125   NITRITE NEGATIVE 10/12/2020 1105   LEUKOCYTESUR NEGATIVE 10/12/2020 1105   LEUKOCYTESUR Negative 08/02/2014 2331   Sepsis Labs Invalid input(s): PROCALCITONIN,  WBC,  LACTICIDVEN Microbiology Recent Results (from the past 240 hour(s))  SARS CORONAVIRUS 2 (TAT 6-24 HRS) Nasopharyngeal Nasopharyngeal Swab  Status: Abnormal   Collection Time: 10/12/20  1:30 PM   Specimen: Nasopharyngeal Swab  Result Value Ref Range Status   SARS Coronavirus 2 POSITIVE (A) NEGATIVE Final    Comment: (NOTE) SARS-CoV-2 target nucleic acids are DETECTED.  The SARS-CoV-2 RNA is generally detectable in upper and lower respiratory specimens during the acute phase of infection. Positive results are indicative of the presence of SARS-CoV-2 RNA. Clinical correlation with patient history and other diagnostic information is  necessary to determine patient infection status. Positive results do not rule out bacterial infection or co-infection with other viruses.  The expected result is Negative.  Fact Sheet for Patients: SugarRoll.be  Fact Sheet for Healthcare Providers: https://www.woods-mathews.com/  This test is not yet approved or cleared by the [place] FDA and  has been authorized for detection and/or diagnosis of SARS-CoV-2 by FDA under an Emergency Use Authorization (EUA). This EUA will remain  in effect (meaning this test can be used) for the duration of the COVID-19 declaration under Section 564(b)(1) of the Act, 21 U. S.C. section 360bbb-3(b)(1), unless the authorization is terminated or revoked sooner.   Performed at [facility], [place] [address], [place], [place] [address]     Time coordinating discharge: Over 30 minutes  SIGNED:  Lorella Nimrod, MD  [facility] 10/13/2020, 2:21 PM  If 7PM-7AM, please contact night-coverage [email or web address]  This record has been created using Systems analyst. Errors have  been sought and corrected,but may not always be located. Such creation errors do not reflect on the standard of care.

## 2020-10-13 NOTE — Evaluation (Signed)
Physical Therapy Evaluation Patient Details Name: AGRIPINA GUYETTE MRN: [id number] DOB: 1940/09/20 Today's Date: 10/13/2020   History of Present Illness  Leora Platt is an 60yoF who comes to [facility] on 2/22 after mechanical [place] at [employer] pt c 10/10 Left shoulder pain. Xrays of shoulder revealed no acute process. PMH: GAD, asthma, back pain c multi level formainal stenosis, COPD, DDD, GERD, HTN, hypoTSH, CRF on 2L O2 at home, CVA. MD suspecting occult fracture given high pain levels, but stable fo rDC to home, OP follow up, arm placed in immobilizer.  Pt brought back to ED next day (2/23) after family concerns for facial droop, difficulty speaking, pt also says some near-syncope symptoms at that time.  Pt has been off her blood thinner (Eliquis) since Monday for upcoming left lower thoracic epidural steroid injection, neurology now recommending resumption of eliquis. Brain MRI shows only remote infarcts, no acute process. Pt (+) COVID on 2/23.  Clinical Impression  Pt admitted with above diagnosis. Pt currently with functional limitations due to the deficits listed below (see "PT Problem List"). Upon entry, pt in bed, awake and agreeable to participate. The pt is alert, pleasant, interactive, and able to provide info regarding prior level of function, both in tolerance and independence. MinA needed to get to EOB, then for help with posterior LOB, then pt able to AMB in room using stationary objects for support in balance. Pt denies any acute difficulty with her strength or balance, but does have baseline unsteadiness of gait, "walks like a drunk," to leave it in her own words. Baseline balance impairment will be addressed while admitted, and recommend ongoing PT to address these at DC given her extensive balance issues and falls history. Patient's performance this date reveals decreased ability, independence, and tolerance in performing all basic mobility required for performance of  activities of daily living. Pt requires additional DME, close physical assistance, and cues for safe participate in mobility. Pt will benefit from skilled PT intervention to increase independence and safety with basic mobility in preparation for discharge to the venue listed below.       Follow Up Recommendations Home health PT    Equipment Recommendations  None recommended by PT    Recommendations for Other Services       Precautions / Restrictions Precautions Precautions: Fall Required Braces or Orthoses: Sling Restrictions Weight Bearing Restrictions: No Other Position/Activity Restrictions: Pt wearing sling, L UE      Mobility  Bed Mobility Overal bed mobility: Needs Assistance Bed Mobility: Supine to Sit     Supine to sit: Min assist     General bed mobility comments: ptr requires HHA to pull self to soitting EOB; RUE not particularly robust in strength    Transfers Overall transfer level: Needs assistance Equipment used: None Transfers: Sit to/from [facility] Sit to Stand: Min guard Stand pivot transfers: Supervision       General transfer comment: uses counter and walls for support while ambulating in room  Ambulation/Gait Ambulation/Gait assistance: Min guard Gait Distance (Feet): 100 Feet Assistive device:  (uses counter and walls for support while ambulating in room) Gait Pattern/deviations: WFL(Within Functional Limits)     General Gait Details: slight retropulsive LOB upon standing with a notable delay in awareness that is more concerning to author than patient. She reports history of frequent posterior LOB  Stairs            Wheelchair Mobility    Modified Rankin (Stroke Patients Only)  Balance Overall balance assessment: Needs assistance   Sitting balance-Leahy Scale: Good       Standing balance-Leahy Scale: Good Standing balance comment: Using walls and furniture for support                              Pertinent Vitals/Pain Pain Assessment: 0-10 Pain Score: 10-Worst pain ever Pain Location: Left shoulder lateral -- with movement. Pain Intervention(s): Limited activity within patient's tolerance;Monitored during session    [place] expects to be discharged to:: Private residence Living Arrangements: Alone Available Help at Discharge: Family;Available PRN/intermittently Type of Home: Apartment Home Access: Elevator     Home Layout: One level Home Equipment: Walker - 4 wheels;Cane - single point;Shower seat;Hand held Tourist information centre manager - 2 wheels;Cane - quad Additional Comments: Lives on 4th floor apartment with elevator. Although pt has an assortment of DME at home, she reports ambulating without AD in both home and community. She describes using walls and furniture to stablize herself when walking within her apt.    Prior Function Level of Independence: Independent         Comments: Ind ADLs (sponge bathes 2/2 fear of falling in tub) and household/community mobility (has AD but does not use, endorses furniture walking). Recently stopped driving, but still goes out into community, with daughters driving.     Hand Dominance   Dominant Hand: Right    Extremity/Trunk Assessment   Upper Extremity Assessment Upper Extremity Assessment: Overall WFL for tasks assessed;LUE deficits/detail LUE Deficits / Details: s/p fall on L shoulder 2 days earlier LUE: Unable to fully assess due to pain    Lower Extremity Assessment Lower Extremity Assessment: Overall WFL for tasks assessed       Communication   Communication: No difficulties  Cognition Arousal/Alertness: Awake/alert Behavior During Therapy: WFL for tasks assessed/performed Overall Cognitive Status: Within Functional Limits for tasks assessed                                        General Comments      Exercises Other Exercises Other Exercises: Educ re: value of AD, falls  prevention, stroke education   Assessment/Plan    PT Assessment Patient needs continued PT services  PT Problem List Decreased activity tolerance;Decreased balance;Decreased mobility;Decreased safety awareness;Decreased knowledge of precautions       PT Treatment Interventions Balance training;DME instruction;Therapeutic activities;Functional mobility training;Gait training;Stair training    PT Goals (Current goals can be found in the Care Plan section)  Acute Rehab PT Goals Patient Stated Goal: to get back home PT Goal Formulation: With patient Time For Goal Achievement: 10/27/20 Potential to Achieve Goals: Good    Frequency Min 2X/week   Barriers to discharge        Co-evaluation               AM-PAC PT "6 Clicks" Mobility  Outcome Measure Help needed turning from your back to your side while in a flat bed without using bedrails?: None Help needed moving from lying on your back to sitting on the side of a flat bed without using bedrails?: A Little Help needed moving to and from a bed to a chair (including a wheelchair)?: A Little Help needed standing up from a chair using your arms (e.g., wheelchair or bedside chair)?: A Little Help needed to walk in hospital room?:  A Little Help needed climbing 3-5 steps with a railing? : A Little 6 Click Score: 19    End of Session Equipment Utilized During Treatment: Gait belt Activity Tolerance: Patient tolerated treatment well Patient left: in chair;with call bell/phone within reach   PT Visit Diagnosis: Unsteadiness on feet (R26.81);History of falling (Z91.81)    Time: 8841-6606 PT Time Calculation (min) (ACUTE ONLY): 18 min   Charges:   PT Evaluation $PT Eval Low Complexity: 1 Low          11:50 AM, 10/13/20 Etta Grandchild, PT, DPT Physical Therapist - [facility]  [phone] ([place])    Tammy Ericsson C 10/13/2020, 11:46 AM

## 2020-10-13 NOTE — TOC Initial Note (Signed)
Transition of Care [facility]) - Initial/Assessment Note    Patient Details  Name: [name] Douglas MRN: [id number] Date of Birth: 18-May-1941  [facility]) CM/SW Contact:    Eileen Stanford, LCSW Phone Number: 10/13/2020, 2:21 PM  Clinical Narrative:   Pt lives home alone. Pt's daughters serve as supports for pt. Pt states she is agreeable to [facility] and has had it in the past. Pt does not remember the name of the agency. CSW read the name of the agencies allowed and the pt is agreeable to [facility] as she has used them in the past. Pt states she has all the equipment she could need at home.   [facility] will accept pt.               Expected Discharge Plan: [place] Barriers to Discharge: Continued Medical Work up   Patient Goals and CMS Choice Patient states their goals for this hospitalization and ongoing recovery are:: to get better   Choice offered to / list presented to : Patient  Expected Discharge Plan and Services Expected Discharge Plan: [place] In-house Referral: NA   Post Acute Care Choice: [place] arrangements for the past 2 months: Single Family Home Expected Discharge Date: 10/13/20                         HH Arranged: OT,PT,RN [place]: [place] ([place]) Date [place]: 10/13/20 Time [place]: 1420 Representative spoke with at [place]  Prior Living Arrangements/Services Living arrangements for the past 2 months: [place] with:: Self Patient language and need for interpreter reviewed:: Yes Do you feel safe going back to the place where you live?: Yes      Need for Family Participation in Patient Care: Yes (Comment) Care giver support system in place?: Yes (comment)   Criminal Activity/Legal Involvement Pertinent to Current Situation/Hospitalization: No - Comment as needed  Activities of Daily Living Home Assistive Devices/Equipment: Walker  (specify type) ADL Screening (condition at time of admission) Patient's cognitive ability adequate to safely complete daily activities?: Yes Is the patient deaf or have difficulty hearing?: Yes Does the patient have difficulty seeing, even when wearing glasses/contacts?: No Does the patient have difficulty concentrating, remembering, or making decisions?: Yes Patient able to express need for assistance with ADLs?: Yes Does the patient have difficulty dressing or bathing?: Yes Independently performs ADLs?: No Does the patient have difficulty walking or climbing stairs?: Yes Weakness of Legs: None Weakness of Arms/Hands: Left  Permission Sought/Granted Permission sought to share information with : Family Supports    Share Information with NAME: donna  Permission granted to share info w AGENC[facility]  Permission granted to share info w Relationship: daughter     Emotional Assessment Appearance:: Appears stated age Attitude/Demeanor/Rapport: Engaged Affect (typically observed): Accepting,Appropriate Orientation: : Oriented to Self,Oriented to Place,Oriented to  Time,Oriented to Situation Alcohol / Substance Use: Not Applicable Psych Involvement: No (comment)  Admission diagnosis:  TIA (transient ischemic attack) [G45.9] Weakness [R53.1] Near syncope [R55] Patient Active Problem List   Diagnosis Date Noted  . TIA (transient ischemic attack) 10/12/2020  . Left shoulder pain 10/12/2020  . Chronic thoracic back pain (Left) 10/10/2020  . T12 compression fracture, sequela 10/10/2020  . Lumbar foraminal stenosis (Bilateral: L2-3, L3-4, L4-5) (Right: L5-S1) (Severe left L4-5) 09/08/2020  . Grade 1 Lumbar Anterolisthesis (L3/4 and L4/5) 09/08/2020  .  Grade 1 Lumbar Retrolisthesis (L2/3) 09/08/2020  . Protrusion of lumbar intervertebral disc (Multilevel) 09/08/2020  . Compression fracture of L1 lumbar vertebra, sequela 09/08/2020  . Other intervertebral disc degeneration, lumbar  region 09/08/2020  . Lumbosacral facet syndrome (Multilevel) (Bilateral) 09/08/2020  . Lumbar facet hypertrophy (Multilevel) (Bilateral) 09/08/2020  . Ligamentum flavum hypertrophy 09/08/2020  . Lumbar lateral recess stenosis (Right: L2-3) 09/08/2020  . DDD (degenerative disc disease), lumbosacral 09/08/2020  . Compression fracture of L5 vertebra ([place]) 09/08/2020  . Osteoporosis with pathological fracture of lumbar vertebra ([place]) 09/08/2020  . Vitamin B12 deficiency 09/08/2020  . Chronic pain syndrome 09/07/2020  . Pharmacologic therapy 09/07/2020  . Disorder of skeletal system 09/07/2020  . Problems influencing health status 09/07/2020  . Uncomplicated opioid dependence ([place]) 09/07/2020  . Chronic low back pain (2ry area of Pain) (Bilateral) (R>L) w/ sciatica (Right) 09/07/2020  . Chronic lower extremity pain (1ry area of Pain) (Right) 09/07/2020  . Chronic lumbar radiculopathy (Right) 09/07/2020  . Lumbosacral radiculopathy at L2 (Right) 09/07/2020  . Lumbar radiculitis (L2 dermatome) (Right) 09/07/2020  . Unspecified inflammatory spondylopathy, lumbar region ([place]) 09/07/2020  . Chronic anticoagulation (Eliquis) 09/07/2020  . Abnormal MRI, lumbar spine (04/20/2020) 09/07/2020  . Intractable low back pain 08/03/2020  . Fall down stairs 08/03/2020  . Hemorrhagic stroke ([place]) 08/03/2020  . CVA (cerebral vascular accident) ([place]) 07/14/2020  . Blurred vision 07/14/2020  . Lumbar central spinal stenosis (Moderate: L2-3  Severe: L3-4, L4-5) w/ neurogenic claudication 05/31/2020  . Muscle spasm 04/10/2020  . Urinary tract infection with hematuria 04/10/2020  . Headache disorder 12/21/2019  . History of stroke 12/21/2019  . Dizziness 11/06/2019  . Atopic dermatitis 10/28/2019  . History of CVA (cerebrovascular accident) 10/07/2019  . Intractable episodic cluster headache 09/20/2019  . Other symptoms and signs involving the nervous system 09/20/2019  . Iron deficiency anemia 05/17/2019   . Vitamin D deficiency 05/17/2019  . Flu vaccine need 05/17/2019  . Encounter for general adult medical examination with abnormal findings 04/05/2019  . Nausea and vomiting 04/05/2019  . Dysuria 04/05/2019  . Need for vaccination against Streptococcus pneumoniae using pneumococcal conjugate vaccine 7 04/05/2019  . Occlusion and stenosis of bilateral carotid arteries 11/24/2017  . Subarachnoid hemorrhage ([place]) 12/09/2016  . Solitary pulmonary nodule 08/24/2016  . Primary localized osteoarthritis of right knee 07/03/2016  . Ileus ([place]) 04/24/2016  . Colitis due to Clostridium difficile   . Recurrent Clostridium difficile diarrhea   . Enteritis due to Clostridium difficile 08/22/2014  . Diarrhea 08/20/2014  . Essential hypertension 08/20/2014  . Hyperlipidemia 08/20/2014  . Hypothyroidism 08/20/2014  . Tobacco abuse 08/20/2014  . COPD (chronic obstructive pulmonary disease) ([place]) 08/20/2014  . Acute diarrhea 07/27/2014   PCP:  Lavera Guise, MD Pharmacy:   [place], [place] - [address] [address] Phone: [phone] Fax: [phone]     Social Determinants of Health (SDOH) Interventions    Readmission Risk Interventions No flowsheet data found.

## 2020-10-13 NOTE — Care Management Obs Status (Signed)
[place] NOTIFICATION   Patient Details  Name: [name] Douglas MRN: [id number] Date of Birth: [birth year]   Medicare Observation Status Notification Given:  Yes    Gerrianne Scale Kaiah Hosea, LCSW 10/13/2020, 2:29 PM

## 2020-10-13 NOTE — Progress Notes (Signed)
SLP Cancellation Note  Patient Details Name: [name] Douglas MRN: [id number] DOB: 1941/05/06   Cancelled treatment:       Reason Eval/Treat Not Completed: SLP screened, no needs identified, will sign off (chart reviewed; consulted pt/family, NSG). Pt denied any difficulty swallowing and is currently on a regular diet; tolerates swallowing pills w/ water per NSG. Pt feeding self her Lunch meal w/out difficulty while in room w/ her. Pt conversed at conversational level w/out deficits noted; pt and family denied any speech-language deficits today. Pt eager to go home she said.  No further skilled ST services indicated as pt appears at her baseline. Pt agreed. NSG to reconsult if any change in status.      [name], MS, CCC-SLP Speech Language Pathologist Rehab Services [phone] [facility] 10/13/2020, 1:04 PM

## 2020-10-13 NOTE — Progress Notes (Signed)
MRI brain: 1. No acute intracranial abnormality. 2. Old right parietal infarct and findings of chronic small vessel Disease.  TTE:  Completed with report pending.   Electronically signed: Dr. Kerney Elbe

## 2020-10-13 NOTE — Progress Notes (Signed)
*  PRELIMINARY RESULTS* Echocardiogram 2D Echocardiogram has been performed.  Sherrie Sport 10/13/2020, 10:21 AM

## 2020-10-13 NOTE — Evaluation (Signed)
Occupational Therapy Evaluation Patient Details Name: [name] Douglas MRN: [id number] DOB: [birth year] Today's Date: 10/13/2020    History of Present Illness Per MD note: [name] Douglas is a 80 y.o. female with medical history significant for anxiety, asthma, COPD, hypertension, hyperlipidemia, hypothyroid, chronic low back pain from LV vertebral fracture in the past, history of 3 strokes, presents to the emergency department for chief concerns of strokelike symptoms. Per patient and daughter at bedside, patient endorses feeling hot, dizzy, and clammy. Daughter said patient's eyes glazed over and she was slow to respond, although understandable. Daughter denies loss of consciousness. Daughter states this episode lasted approximately 30 minutes and has completely resolved. Daughter states that this was similar to November 2021 episode. Pt also reports a fall in a parking lot on 10/11/20, landing on grass on her L shoulder, and endorses left-sided arm pain that is 10 out of 10 and minimally improves with IV pain medication. Multiple x-rays of the shoulder and chest have been done and are negative for acute fracture.   Clinical Impression   [name] Douglas is pleasant, talkative, fully oriented this AM, displaying no signs of confusion or slow speech noted by daughter prior to ED visit. Pt reports no pain lying in bed, but endorses 10/10 L shoulder pain with movement. She reports 2 falls in previous 12 months (1 home in bathroom, 1 in community 2 days ago). During today's evaluation, [name] Douglas is able to perform bed mobility, transfers, grooming, toileting with Mod I, requiring increased time secondary to limited mobility at present in splinted L UE. [name] Douglas lives alone, IND in ADL and IADL (with assistance from family for driving), and with frequent visits from her 2 daughters, who live nearby. Pt does report furniture walking within her home, which she describes as "very small," and community ambulation w/o  AD. She reports that there is "hardly room" for using a walker in her apartment. No follow-up OT is required at this time. This therapist, however, strongly encouraged pt to use the AD she already owns (a rollator, a 2 [employer], and a 4 [employer]) whenever she goes out in the community.    Follow Up Recommendations  No OT follow up    Equipment Recommendations  None recommended by OT    Recommendations for Other Services       Precautions / Restrictions Precautions Precautions: Fall Restrictions Weight Bearing Restrictions: No Other Position/Activity Restrictions: Pt wearing sling, L UE      Mobility Bed Mobility Overal bed mobility: Modified Independent             General bed mobility comments: increased time, 2/2 shoulder pain/guarding    Transfers Overall transfer level: Modified independent               General transfer comment: uses counter and walls for support while ambulating in room    Balance Overall balance assessment: Needs assistance   Sitting balance-Leahy Scale: Good       Standing balance-Leahy Scale: Good Standing balance comment: Using walls and furniture for support                           ADL either performed or assessed with clinical judgement   ADL Overall ADL's : Modified independent  General ADL Comments: Requires increased time and effort, 2/2 LUE pain/sling.     Vision Baseline Vision/History: Wears glasses Wears Glasses: At all times Patient Visual Report: No change from baseline       Perception     Praxis      Pertinent Vitals/Pain Pain Assessment: 0-10 Pain Score: 10-Worst pain ever Pain Location: Left shoulder lateral -- with movement. Pain Intervention(s): Limited activity within patient's tolerance;Monitored during session     Hand Dominance Right   Extremity/Trunk Assessment Upper Extremity Assessment Upper Extremity Assessment: Overall WFL for  tasks assessed;LUE deficits/detail LUE Deficits / Details: s/p fall on L shoulder 2 days earlier LUE: Unable to fully assess due to pain   Lower Extremity Assessment Lower Extremity Assessment: Overall WFL for tasks assessed       Communication Communication Communication: No difficulties   Cognition Arousal/Alertness: Awake/alert Behavior During Therapy: WFL for tasks assessed/performed Overall Cognitive Status: Within Functional Limits for tasks assessed                                     General Comments       Exercises Other Exercises Other Exercises: Educ re: value of AD, falls prevention, stroke education   Shoulder Instructions      Home Living Family/patient expects to be discharged to:: Private residence Living Arrangements: Alone Available Help at Discharge: Family;Available PRN/intermittently Type of Home: Apartment Home Access: Elevator     Home Layout: One level     Bathroom Shower/Tub: Teacher, early years/pre: Standard Bathroom Accessibility: Yes How Accessible: Accessible via walker Home Equipment: Walker - 4 wheels;Cane - single point;Shower seat;Hand held Tourist information centre manager - 2 wheels;Cane - quad   Additional Comments: Lives on 4th floor apartment with elevator. Although pt has an assortment of DME at home, she reports ambulating without AD in both home and community. She describes using walls and furniture to stablize herself when walking within her apt.      Prior Functioning/Environment Level of Independence: Independent        Comments: Ind ADLs (sponge bathes 2/2 fear of falling in tub) and household/community mobility (has AD but does not use, endorses furniture walking). Recently stopped driving, but still goes out into community, with daughters driving.        OT Problem List: Decreased strength;Decreased range of motion;Impaired balance (sitting and/or standing)      OT Treatment/Interventions:      OT  Goals(Current goals can be found in the care plan section) Acute Rehab OT Goals Patient Stated Goal: to get back home OT Goal Formulation: With patient Time For Goal Achievement: 10/27/20 Potential to Achieve Goals: Good  OT Frequency:     Barriers to D/C:            Co-evaluation              AM-PAC OT "6 Clicks" Daily Activity     Outcome Measure Help from another person eating meals?: None Help from another person taking care of personal grooming?: None Help from another person toileting, which includes using toliet, bedpan, or urinal?: A Little Help from another person bathing (including washing, rinsing, drying)?: A Little Help from another person to put on and taking off regular upper body clothing?: None Help from another person to put on and taking off regular lower body clothing?: A Little 6 Click Score: 21   End of Session  Activity Tolerance: Patient tolerated treatment well Patient left: in bed;with bed alarm set;with call bell/phone within reach;Other (comment) (MD coming into room)  OT Visit Diagnosis: Muscle weakness (generalized) (M62.81);History of falling (Z91.81)                Time: 1010-1035 OT Time Calculation (min): 25 min Charges:  OT General Charges $OT Visit: 1 Visit OT Evaluation $OT Eval Low Complexity: 1 Low OT Treatments $Self Care/Home Management : 23-37 mins  Josiah Lobo, PhD, [place], OTR/L ascom [phone] 10/13/20, 11:03 AM

## 2020-10-13 NOTE — Progress Notes (Signed)
Pts BP  Remains elevated/ MD aware/ additional BP med given and ok to discharge per MD/ iv and tele removed/ transported off unit via wheelchair.

## 2020-10-14 ENCOUNTER — Ambulatory Visit: Payer: Self-pay

## 2020-10-14 ENCOUNTER — Telehealth: Payer: Self-pay | Admitting: Infectious Diseases

## 2020-10-14 DIAGNOSIS — M19012 Primary osteoarthritis, left shoulder: Secondary | ICD-10-CM | POA: Diagnosis not present

## 2020-10-14 DIAGNOSIS — I4891 Unspecified atrial fibrillation: Secondary | ICD-10-CM | POA: Diagnosis not present

## 2020-10-14 DIAGNOSIS — M5459 Other low back pain: Secondary | ICD-10-CM | POA: Diagnosis not present

## 2020-10-14 DIAGNOSIS — F32A Depression, unspecified: Secondary | ICD-10-CM | POA: Diagnosis not present

## 2020-10-14 DIAGNOSIS — K219 Gastro-esophageal reflux disease without esophagitis: Secondary | ICD-10-CM | POA: Diagnosis not present

## 2020-10-14 DIAGNOSIS — F419 Anxiety disorder, unspecified: Secondary | ICD-10-CM | POA: Diagnosis not present

## 2020-10-14 DIAGNOSIS — M19011 Primary osteoarthritis, right shoulder: Secondary | ICD-10-CM | POA: Diagnosis not present

## 2020-10-14 DIAGNOSIS — M5117 Intervertebral disc disorders with radiculopathy, lumbosacral region: Secondary | ICD-10-CM | POA: Diagnosis not present

## 2020-10-14 DIAGNOSIS — J449 Chronic obstructive pulmonary disease, unspecified: Secondary | ICD-10-CM | POA: Diagnosis not present

## 2020-10-14 DIAGNOSIS — F17210 Nicotine dependence, cigarettes, uncomplicated: Secondary | ICD-10-CM | POA: Diagnosis not present

## 2020-10-14 DIAGNOSIS — U071 COVID-19: Secondary | ICD-10-CM | POA: Diagnosis not present

## 2020-10-14 DIAGNOSIS — E039 Hypothyroidism, unspecified: Secondary | ICD-10-CM | POA: Diagnosis not present

## 2020-10-14 DIAGNOSIS — G459 Transient cerebral ischemic attack, unspecified: Secondary | ICD-10-CM | POA: Diagnosis not present

## 2020-10-14 DIAGNOSIS — E785 Hyperlipidemia, unspecified: Secondary | ICD-10-CM | POA: Diagnosis not present

## 2020-10-14 DIAGNOSIS — G894 Chronic pain syndrome: Secondary | ICD-10-CM | POA: Diagnosis not present

## 2020-10-14 DIAGNOSIS — M546 Pain in thoracic spine: Secondary | ICD-10-CM | POA: Diagnosis not present

## 2020-10-14 DIAGNOSIS — I10 Essential (primary) hypertension: Secondary | ICD-10-CM | POA: Diagnosis not present

## 2020-10-14 DIAGNOSIS — I739 Peripheral vascular disease, unspecified: Secondary | ICD-10-CM | POA: Diagnosis not present

## 2020-10-14 DIAGNOSIS — Z8673 Personal history of transient ischemic attack (TIA), and cerebral infarction without residual deficits: Secondary | ICD-10-CM | POA: Diagnosis not present

## 2020-10-14 DIAGNOSIS — W19XXXD Unspecified fall, subsequent encounter: Secondary | ICD-10-CM | POA: Diagnosis not present

## 2020-10-14 DIAGNOSIS — R7303 Prediabetes: Secondary | ICD-10-CM | POA: Diagnosis not present

## 2020-10-14 DIAGNOSIS — Z9981 Dependence on supplemental oxygen: Secondary | ICD-10-CM | POA: Diagnosis not present

## 2020-10-14 DIAGNOSIS — S32059S Unspecified fracture of fifth lumbar vertebra, sequela: Secondary | ICD-10-CM | POA: Diagnosis not present

## 2020-10-14 DIAGNOSIS — K5790 Diverticulosis of intestine, part unspecified, without perforation or abscess without bleeding: Secondary | ICD-10-CM | POA: Diagnosis not present

## 2020-10-14 DIAGNOSIS — F0280 Dementia in other diseases classified elsewhere without behavioral disturbance: Secondary | ICD-10-CM | POA: Diagnosis not present

## 2020-10-14 NOTE — Telephone Encounter (Signed)
Called to discuss with patient about COVID-19 symptoms and the use of one of the available treatments for those with mild to moderate Covid symptoms and at a high risk of hospitalization.  Pt appears to qualify for outpatient treatment due to co-morbid conditions and/or a member of an at-risk group in accordance with the FDA Emergency Use Authorization.    She declined consideration of any therapy.  She just wants to sleep. I encouraged her to consider monoclonal therapy since she did not get booster.  She was not interested in spending much time on the phone. I asked her to please check in with her PCP for an update on her condition Monday. I will send a note to her as well.   Janene Madeira, NP

## 2020-10-14 NOTE — Telephone Encounter (Signed)
Called to discuss with patient about COVID-19 symptoms and the use of one of the available treatments for those with mild to moderate Covid symptoms and at a high risk of hospitalization.  Pt appears to qualify for outpatient treatment due to co-morbid conditions and/or a member of an at-risk group in accordance with the FDA Emergency Use Authorization.    Symptom onset: 10/12/20 Weakness, mild cough Vaccinated: Yes Booster? No Immunocompromised? No Qualifiers: HTN,CVA,COPD  Pt. Would like to speak with APP. States her daughter, Larene Beach, will want to speak with them as well. She will find her contact number. Marcello Moores

## 2020-10-15 LAB — CUP PACEART REMOTE DEVICE CHECK
Date Time Interrogation Session: 20220223230556
Implantable Pulse Generator Implant Date: 20190516

## 2020-10-15 NOTE — Telephone Encounter (Signed)
Okay thank you!!, will keep an eye on her as well

## 2020-10-17 ENCOUNTER — Telehealth: Payer: Self-pay

## 2020-10-17 ENCOUNTER — Inpatient Hospital Stay: Payer: Medicare Other | Admitting: Hospice and Palliative Medicine

## 2020-10-17 ENCOUNTER — Ambulatory Visit (INDEPENDENT_AMBULATORY_CARE_PROVIDER_SITE_OTHER): Payer: Medicare Other

## 2020-10-17 DIAGNOSIS — I639 Cerebral infarction, unspecified: Secondary | ICD-10-CM

## 2020-10-17 NOTE — Telephone Encounter (Signed)
Gave verbal order to [facility] [id number]  2 times a week 1 and 2 times a week for 3 week  And once a week for 5  Week and 2 prn

## 2020-10-18 ENCOUNTER — Telehealth: Payer: Self-pay

## 2020-10-18 ENCOUNTER — Inpatient Hospital Stay: Payer: Medicare Other | Admitting: Hospice and Palliative Medicine

## 2020-10-18 DIAGNOSIS — G894 Chronic pain syndrome: Secondary | ICD-10-CM | POA: Diagnosis not present

## 2020-10-18 DIAGNOSIS — G459 Transient cerebral ischemic attack, unspecified: Secondary | ICD-10-CM | POA: Diagnosis not present

## 2020-10-18 DIAGNOSIS — M5117 Intervertebral disc disorders with radiculopathy, lumbosacral region: Secondary | ICD-10-CM | POA: Diagnosis not present

## 2020-10-18 DIAGNOSIS — S32059S Unspecified fracture of fifth lumbar vertebra, sequela: Secondary | ICD-10-CM | POA: Diagnosis not present

## 2020-10-18 DIAGNOSIS — M5459 Other low back pain: Secondary | ICD-10-CM | POA: Diagnosis not present

## 2020-10-18 DIAGNOSIS — M546 Pain in thoracic spine: Secondary | ICD-10-CM | POA: Diagnosis not present

## 2020-10-18 NOTE — Telephone Encounter (Signed)
Gave verbal Order to [facility] [id number] Occupational therapy 2 times a week for 3 week

## 2020-10-21 DIAGNOSIS — M5459 Other low back pain: Secondary | ICD-10-CM | POA: Diagnosis not present

## 2020-10-21 DIAGNOSIS — G459 Transient cerebral ischemic attack, unspecified: Secondary | ICD-10-CM | POA: Diagnosis not present

## 2020-10-21 DIAGNOSIS — S32059S Unspecified fracture of fifth lumbar vertebra, sequela: Secondary | ICD-10-CM | POA: Diagnosis not present

## 2020-10-21 DIAGNOSIS — G894 Chronic pain syndrome: Secondary | ICD-10-CM | POA: Diagnosis not present

## 2020-10-21 DIAGNOSIS — M5117 Intervertebral disc disorders with radiculopathy, lumbosacral region: Secondary | ICD-10-CM | POA: Diagnosis not present

## 2020-10-21 DIAGNOSIS — M546 Pain in thoracic spine: Secondary | ICD-10-CM | POA: Diagnosis not present

## 2020-10-24 DIAGNOSIS — S32059S Unspecified fracture of fifth lumbar vertebra, sequela: Secondary | ICD-10-CM | POA: Diagnosis not present

## 2020-10-24 DIAGNOSIS — M546 Pain in thoracic spine: Secondary | ICD-10-CM | POA: Diagnosis not present

## 2020-10-24 DIAGNOSIS — M5459 Other low back pain: Secondary | ICD-10-CM | POA: Diagnosis not present

## 2020-10-24 DIAGNOSIS — G459 Transient cerebral ischemic attack, unspecified: Secondary | ICD-10-CM | POA: Diagnosis not present

## 2020-10-24 DIAGNOSIS — G894 Chronic pain syndrome: Secondary | ICD-10-CM | POA: Diagnosis not present

## 2020-10-24 DIAGNOSIS — M5117 Intervertebral disc disorders with radiculopathy, lumbosacral region: Secondary | ICD-10-CM | POA: Diagnosis not present

## 2020-10-25 ENCOUNTER — Other Ambulatory Visit: Payer: Self-pay | Admitting: Pain Medicine

## 2020-10-25 NOTE — Progress Notes (Signed)
Carelink Summary Report / Loop Recorder 

## 2020-10-26 DIAGNOSIS — M5459 Other low back pain: Secondary | ICD-10-CM | POA: Diagnosis not present

## 2020-10-26 DIAGNOSIS — M5117 Intervertebral disc disorders with radiculopathy, lumbosacral region: Secondary | ICD-10-CM | POA: Diagnosis not present

## 2020-10-26 DIAGNOSIS — G894 Chronic pain syndrome: Secondary | ICD-10-CM | POA: Diagnosis not present

## 2020-10-26 DIAGNOSIS — M546 Pain in thoracic spine: Secondary | ICD-10-CM | POA: Diagnosis not present

## 2020-10-26 DIAGNOSIS — S32059S Unspecified fracture of fifth lumbar vertebra, sequela: Secondary | ICD-10-CM | POA: Diagnosis not present

## 2020-10-26 DIAGNOSIS — G459 Transient cerebral ischemic attack, unspecified: Secondary | ICD-10-CM | POA: Diagnosis not present

## 2020-10-27 DIAGNOSIS — M5459 Other low back pain: Secondary | ICD-10-CM | POA: Diagnosis not present

## 2020-10-27 DIAGNOSIS — S32059S Unspecified fracture of fifth lumbar vertebra, sequela: Secondary | ICD-10-CM | POA: Diagnosis not present

## 2020-10-27 DIAGNOSIS — G894 Chronic pain syndrome: Secondary | ICD-10-CM | POA: Diagnosis not present

## 2020-10-27 DIAGNOSIS — M5117 Intervertebral disc disorders with radiculopathy, lumbosacral region: Secondary | ICD-10-CM | POA: Diagnosis not present

## 2020-10-27 DIAGNOSIS — G459 Transient cerebral ischemic attack, unspecified: Secondary | ICD-10-CM | POA: Diagnosis not present

## 2020-10-27 DIAGNOSIS — M546 Pain in thoracic spine: Secondary | ICD-10-CM | POA: Diagnosis not present

## 2020-10-28 ENCOUNTER — Ambulatory Visit (INDEPENDENT_AMBULATORY_CARE_PROVIDER_SITE_OTHER): Payer: Medicare Other | Admitting: Hospice and Palliative Medicine

## 2020-10-28 ENCOUNTER — Other Ambulatory Visit: Payer: Self-pay

## 2020-10-28 ENCOUNTER — Other Ambulatory Visit: Payer: Self-pay | Admitting: Nurse Practitioner

## 2020-10-28 ENCOUNTER — Encounter: Payer: Self-pay | Admitting: Hospice and Palliative Medicine

## 2020-10-28 VITALS — BP 138/70 | HR 67 | Temp 98.0°F | Resp 16 | Ht 64.0 in | Wt 213.4 lb

## 2020-10-28 DIAGNOSIS — D649 Anemia, unspecified: Secondary | ICD-10-CM | POA: Diagnosis not present

## 2020-10-28 DIAGNOSIS — R6889 Other general symptoms and signs: Secondary | ICD-10-CM

## 2020-10-28 DIAGNOSIS — R5383 Other fatigue: Secondary | ICD-10-CM | POA: Diagnosis not present

## 2020-10-28 DIAGNOSIS — Z8673 Personal history of transient ischemic attack (TIA), and cerebral infarction without residual deficits: Secondary | ICD-10-CM | POA: Diagnosis not present

## 2020-10-28 DIAGNOSIS — I10 Essential (primary) hypertension: Secondary | ICD-10-CM | POA: Diagnosis not present

## 2020-10-28 DIAGNOSIS — M25512 Pain in left shoulder: Secondary | ICD-10-CM

## 2020-10-28 MED ORDER — OXYCODONE HCL 5 MG PO TABS: 5.0000 mg | ORAL_TABLET | ORAL | 0 refills | Status: DC | PRN

## 2020-10-28 NOTE — Progress Notes (Signed)
[facility] [place], [place] [address]  Internal MEDICINE  Office Visit Note  Patient Name: [name] Douglas  [id number]  [id number]  Date of Service: 10/31/2020  Transitional Care Management//Hospital Follow-up   Chief Complaint  Patient presents with  . Hospitalization Follow-up  . Hyperlipidemia  . Hypertension  . Depression     HPI Pt is here for recent hospital follow up. Admitted 2/23-2/24, presented with stroke-like symptoms, had a fall earlier on the day of the 23rd--CT scan and MRI negative for CVA Found to be positive for COVID19 without symptoms Biggest complaint is pain in left shoulder related to her fall--has not yet been to see orthopaedic as recommended at discharge Imaging while inpatient negative for acute processes or fractures Pain is worsening each day and is becoming unbearable  Discussed her fall--feels as though she tripped over her shoes, was not utilizing walker or cane for assistance  Reviewed labs while inpatient--hgb 11.6, A1C 6.5 Echocardiogram--essentially normal  Current Medication: Outpatient Encounter Medications as of 10/28/2020  Medication Sig Note  . albuterol (VENTOLIN HFA) 108 (90 Base) MCG/ACT inhaler Inhale 2 puffs into the lungs every 6 (six) hours as needed for wheezing or shortness of breath.   Marland Kitchen apixaban (ELIQUIS) 5 MG TABS tablet Take 1 tablet (5 mg total) by mouth 2 (two) times daily. (Patient taking differently: Take 5 mg by mouth daily.) 10/12/2020: On hold until 2/24  . atorvastatin (LIPITOR) 80 MG tablet Take 1 tablet (80 mg total) by mouth daily at 6 PM.   . Cyanocobalamin (VITAMIN B-12) 5000 MCG SUBL Place 1 tablet (5,000 mcg total) under the tongue daily.   . cyclobenzaprine (FLEXERIL) 5 MG tablet Take 1 tablet (5 mg total) by mouth at bedtime.   . docusate sodium (COLACE) 100 MG capsule Take 100 mg by mouth daily as needed for mild constipation.    Marland Kitchen donepezil (ARICEPT) 5 MG tablet Take 1 tablet  (5 mg total) by mouth at bedtime.   . ergocalciferol (DRISDOL) 1.25 MG (50000 UT) capsule Take 1 capsule (50,000 Units total) by mouth every Friday.   . gabapentin (NEURONTIN) 100 MG capsule Take 200 mg by mouth 3 (three) times daily.   Marland Kitchen levothyroxine (SYNTHROID) 75 MCG tablet Take 1 tablet (75 mcg total) by mouth daily before breakfast.   . losartan (COZAAR) 25 MG tablet Take 1 tablet (25 mg total) by mouth daily.   . metoprolol succinate (TOPROL-XL) 50 MG 24 hr tablet Take 2 tablets (100 mg total) by mouth daily.   Marland Kitchen oxyCODONE (ROXICODONE) 5 MG immediate release tablet Take 1 tablet (5 mg total) by mouth every 4 (four) hours as needed for severe pain.   Marland Kitchen venlafaxine XR (EFFEXOR-XR) 75 MG 24 hr capsule TAKE 2 CAPSULES BY MOUTH DAILY.    No facility-administered encounter medications on file as of 10/28/2020.    Surgical History: Past Surgical History:  Procedure Laterality Date  . CARDIAC CATHETERIZATION  ~ 2007  . EXCISIONAL HEMORRHOIDECTOMY  1960's  . EYE SURGERY Bilateral    Cataract extraction with IOL  . KNEE ARTHROSCOPY Left X 2 <2008  . KNEE ARTHROSCOPY WITH SUBCHONDROPLASTY Right 04/19/2016   Procedure: KNEE ARTHROSCOPY WITH SUBCHONDROPLASTY, PARTIAL MENISCECTOMY;  Surgeon: Hessie Knows, MD;  Location: [facility];  Service: Orthopedics;  Laterality: Right;  . LOOP RECORDER INSERTION N/A 01/02/2018   Procedure: LOOP RECORDER INSERTION;  Surgeon: Deboraha Sprang, MD;  Location: [facility];  Service: Cardiovascular;  Laterality: N/A;  .  SHOULDER OPEN ROTATOR CUFF REPAIR Right 2001  . TONSILLECTOMY  ~ 1950  . TOTAL KNEE ARTHROPLASTY Left 2008  . TOTAL KNEE ARTHROPLASTY Right 07/03/2016   Procedure: TOTAL KNEE ARTHROPLASTY;  Surgeon: Hessie Knows, MD;  Location: [facility];  Service: Orthopedics;  Laterality: Right;  . TUBAL LIGATION  1973  . VAGINAL HYSTERECTOMY  1974    Medical History: Past Medical History:  Diagnosis Date  . Anxiety   . Arthritis    "shoulders"  (08/20/2014)  . Asthma   . Chronic lower back pain   . Complication of anesthesia    "agitated & restless after knee replacement in 2008"  . COPD (chronic obstructive pulmonary disease) ([place])   . DDD (degenerative disc disease), lumbar   . Depression   . Diverticulosis   . GERD (gastroesophageal reflux disease)   . High cholesterol   . Hypertension   . Hypothyroidism   . Memory loss    mild  . On home oxygen therapy    "2L at night" (08/20/2014)  . Peripheral vascular disease ([place])   . Pre-diabetes   . Shortness of breath dyspnea    with exertion  . Stroke ([place])    mild, no deficits    Family History: Family History  Problem Relation Age of Onset  . Diabetes Mother   . Hypertension Mother   . Lung cancer Mother   . Congestive Heart Failure Father   . Congestive Heart Failure Sister     Social History   Socioeconomic History  . Marital status: Divorced    Spouse name: Not on file  . Number of children: Not on file  . Years of education: Not on file  . Highest education level: Not on file  Occupational History  . Not on file  Tobacco Use  . Smoking status: Current Some Day Smoker    Packs/day: 0.25    Years: 53.00    Pack years: 13.25    Types: Cigarettes  . Smokeless tobacco: Never Used  . Tobacco comment: 3 cigarettes in week  Vaping Use  . Vaping Use: Never used  Substance and Sexual Activity  . Alcohol use: No  . Drug use: No  . Sexual activity: Never  Other Topics Concern  . Not on file  Social History Narrative  . Not on file   Social Determinants of Health   Financial Resource Strain: Not on file  Food Insecurity: Not on file  Transportation Needs: Not on file  Physical Activity: Not on file  Stress: Not on file  Social Connections: Not on file  Intimate Partner Violence: Not on file      Review of Systems  Constitutional: Negative for chills, diaphoresis and fatigue.  HENT: Negative for ear pain, postnasal drip and sinus pressure.    Eyes: Negative for photophobia, discharge, redness, itching and visual disturbance.  Respiratory: Negative for cough, shortness of breath and wheezing.   Cardiovascular: Negative for chest pain, palpitations and leg swelling.  Gastrointestinal: Negative for abdominal pain, constipation, diarrhea, nausea and vomiting.  Genitourinary: Negative for dysuria and flank pain.  Musculoskeletal: Negative for arthralgias, back pain, gait problem and neck pain.       Left shoulder pain  Skin: Negative for color change.  Allergic/Immunologic: Negative for environmental allergies and food allergies.  Neurological: Negative for dizziness and headaches.  Hematological: Does not bruise/bleed easily.  Psychiatric/Behavioral: Negative for agitation, behavioral problems (depression) and hallucinations.    Vital Signs: BP 138/70   Pulse 67  Temp 98 F (36.7 C)   Resp 16   Ht 5\' 4"  (1.626 m)   Wt 213 lb 6.4 oz (96.8 kg)   SpO2 95%   BMI 36.63 kg/m    Physical Exam Vitals reviewed.  Constitutional:      Appearance: Normal appearance. She is normal weight.  Cardiovascular:     Rate and Rhythm: Normal rate and regular rhythm.     Pulses: Normal pulses.     Heart sounds: Normal heart sounds.  Pulmonary:     Effort: Pulmonary effort is normal.     Breath sounds: Normal breath sounds.  Abdominal:     General: Abdomen is flat.     Palpations: Abdomen is soft.  Musculoskeletal:     Left shoulder: Tenderness present. Decreased range of motion.     Cervical back: Normal range of motion.     Comments: Obvious pain with ROM exercises   Skin:    General: Skin is warm.  Neurological:     General: No focal deficit present.     Mental Status: She is alert and oriented to person, place, and time. Mental status is at baseline.  Psychiatric:        Mood and Affect: Mood normal.        Behavior: Behavior normal.        Thought Content: Thought content normal.        Judgment: Judgment normal.     Assessment/Plan: 1. Acute pain of left shoulder S/p mechanical fall, imaging from admission reviewed, no evidence of acute fracture or dislocation, advised to contact previously established orthopaedist to schedule a follow-up Short course oxycodone to bridge until orthopaedic evaluation [place] Controlled Substance Database was reviewed by me for overdose risk score (ORS) - oxyCODONE (ROXICODONE) 5 MG immediate release tablet; Take 1 tablet (5 mg total) by mouth every 4 (four) hours as needed for severe pain.  Dispense: 30 tablet; Refill: 0  2. Essential hypertension BP and HR well controlled, continue current therapy and monitoring   3. History of CVA (cerebrovascular accident) Continue statin therapy as well as Eliquis  4. Other fatigue Repeat labs from recent hospital admission - CBC w/Diff/Platelet - Comprehensive Metabolic Panel (CMET) - Lipid Panel With LDL/HDL Ratio - TSH + free T4 - Folate - Iron and TIBC - Ferritin  5. Anemia, unspecified type hgb 11.6 during admission, recheck for stability and underlying cause - CBC w/Diff/Platelet - Folate - Iron and TIBC - Ferritin  6. Other general symptoms and signs  - Folate  General Counseling: Lasonja verbalizes understanding of the findings of todays visit and agrees with plan of treatment. I have discussed any further diagnostic evaluation that may be needed or ordered today. We also reviewed her medications today. she has been encouraged to call the office with any questions or concerns that should arise related to todays visit.   Orders Placed This Encounter  Procedures  . CBC w/Diff/Platelet  . Comprehensive Metabolic Panel (CMET)  . Lipid Panel With LDL/HDL Ratio  . TSH + free T4  . Folate  . Iron and TIBC  . Ferritin      I have reviewed all medical records from hospital follow up including radiology reports and consults from other physicians. Appropriate follow up diagnostics will be scheduled as needed.  Patient/ Family understands the plan of treatment.   Time spent 30 minutes.  This patient was seen by Theodoro Grist AGNP-C in Collaboration with Dr Lavera Guise as a part of  collaborative care agreement  Tanna Furry. [facility] Internal Medicine

## 2020-10-31 ENCOUNTER — Encounter: Payer: Self-pay | Admitting: Hospice and Palliative Medicine

## 2020-10-31 ENCOUNTER — Telehealth: Payer: Self-pay | Admitting: Pain Medicine

## 2020-10-31 DIAGNOSIS — G459 Transient cerebral ischemic attack, unspecified: Secondary | ICD-10-CM | POA: Diagnosis not present

## 2020-10-31 DIAGNOSIS — M5459 Other low back pain: Secondary | ICD-10-CM | POA: Diagnosis not present

## 2020-10-31 DIAGNOSIS — G894 Chronic pain syndrome: Secondary | ICD-10-CM | POA: Diagnosis not present

## 2020-10-31 DIAGNOSIS — M5117 Intervertebral disc disorders with radiculopathy, lumbosacral region: Secondary | ICD-10-CM | POA: Diagnosis not present

## 2020-10-31 DIAGNOSIS — S32059S Unspecified fracture of fifth lumbar vertebra, sequela: Secondary | ICD-10-CM | POA: Diagnosis not present

## 2020-10-31 DIAGNOSIS — M546 Pain in thoracic spine: Secondary | ICD-10-CM | POA: Diagnosis not present

## 2020-10-31 NOTE — Telephone Encounter (Signed)
Patient's daughter Larene Beach is calling to reschedule her mom for a procedure. She has some questions about blood thinner and wants Dr. Dossie Arbour to know patient didn't have a stroke, they think her BP dropped really low was why she had to go to hospital. I scheduled patient for 11-08-20 procedure appt. Please call daughter about this.   Call [phone] [facility]

## 2020-10-31 NOTE — Telephone Encounter (Signed)
Left message that patient would need to stop blood thinner Eliquis for 3 days prior to procedure.  If she has any other questions, she needs to call the office.

## 2020-11-02 ENCOUNTER — Other Ambulatory Visit: Payer: Self-pay | Admitting: Nurse Practitioner

## 2020-11-02 ENCOUNTER — Other Ambulatory Visit: Payer: Self-pay | Admitting: Hospice and Palliative Medicine

## 2020-11-02 DIAGNOSIS — G459 Transient cerebral ischemic attack, unspecified: Secondary | ICD-10-CM | POA: Diagnosis not present

## 2020-11-02 DIAGNOSIS — I10 Essential (primary) hypertension: Secondary | ICD-10-CM

## 2020-11-02 DIAGNOSIS — M546 Pain in thoracic spine: Secondary | ICD-10-CM | POA: Diagnosis not present

## 2020-11-02 DIAGNOSIS — S32059S Unspecified fracture of fifth lumbar vertebra, sequela: Secondary | ICD-10-CM | POA: Diagnosis not present

## 2020-11-02 DIAGNOSIS — M5459 Other low back pain: Secondary | ICD-10-CM | POA: Diagnosis not present

## 2020-11-02 DIAGNOSIS — G894 Chronic pain syndrome: Secondary | ICD-10-CM | POA: Diagnosis not present

## 2020-11-02 DIAGNOSIS — M5117 Intervertebral disc disorders with radiculopathy, lumbosacral region: Secondary | ICD-10-CM | POA: Diagnosis not present

## 2020-11-08 ENCOUNTER — Telehealth: Payer: Self-pay

## 2020-11-08 ENCOUNTER — Ambulatory Visit: Payer: Medicare Other | Admitting: Pain Medicine

## 2020-11-08 NOTE — Telephone Encounter (Signed)
ILR tachy alert, will need manual transmission for review of episode (no details or episode downloaded). Block of disconnected monitor at end of February and most likely occurred at this time. Routed to remote admin for retrieval of transmission for further review. Per [employer].  I LMOVM for patient to send a manual transmission and left the device clinic direct number.

## 2020-11-10 ENCOUNTER — Ambulatory Visit (HOSPITAL_BASED_OUTPATIENT_CLINIC_OR_DEPARTMENT_OTHER): Payer: Medicare Other | Admitting: Pain Medicine

## 2020-11-10 ENCOUNTER — Encounter: Payer: Self-pay | Admitting: Pain Medicine

## 2020-11-10 ENCOUNTER — Other Ambulatory Visit: Payer: Self-pay

## 2020-11-10 ENCOUNTER — Ambulatory Visit
Admission: RE | Admit: 2020-11-10 | Discharge: 2020-11-10 | Disposition: A | Discharge status: Home / Self Care | Payer: Medicare Other | Source: Ambulatory Visit | Attending: Pain Medicine | Admitting: Pain Medicine

## 2020-11-10 VITALS — BP 157/55 | HR 74 | Temp 97.1°F | Resp 18 | Ht 64.0 in | Wt 213.0 lb

## 2020-11-10 DIAGNOSIS — M546 Pain in thoracic spine: Secondary | ICD-10-CM | POA: Diagnosis not present

## 2020-11-10 DIAGNOSIS — S32010S Wedge compression fracture of first lumbar vertebra, sequela: Secondary | ICD-10-CM | POA: Insufficient documentation

## 2020-11-10 DIAGNOSIS — Z9981 Dependence on supplemental oxygen: Secondary | ICD-10-CM | POA: Insufficient documentation

## 2020-11-10 DIAGNOSIS — S22080S Wedge compression fracture of T11-T12 vertebra, sequela: Secondary | ICD-10-CM | POA: Insufficient documentation

## 2020-11-10 DIAGNOSIS — Z7901 Long term (current) use of anticoagulants: Secondary | ICD-10-CM | POA: Diagnosis not present

## 2020-11-10 DIAGNOSIS — M5134 Other intervertebral disc degeneration, thoracic region: Secondary | ICD-10-CM | POA: Diagnosis not present

## 2020-11-10 DIAGNOSIS — G8929 Other chronic pain: Secondary | ICD-10-CM | POA: Insufficient documentation

## 2020-11-10 DIAGNOSIS — J449 Chronic obstructive pulmonary disease, unspecified: Secondary | ICD-10-CM | POA: Insufficient documentation

## 2020-11-10 MED ORDER — LACTATED RINGERS IV SOLN
1000.0000 mL | Freq: Once | INTRAVENOUS | Status: AC
  Administered 2020-11-10: 1000 mL via INTRAVENOUS

## 2020-11-10 MED ORDER — IOHEXOL 180 MG/ML  SOLN
10.0000 mL | Freq: Once | INTRAMUSCULAR | Status: AC
  Administered 2020-11-10: 10 mL via EPIDURAL
  Filled 2020-11-10: qty 20

## 2020-11-10 MED ORDER — SODIUM CHLORIDE 0.9% FLUSH
2.0000 mL | Freq: Once | INTRAVENOUS | Status: AC
  Administered 2020-11-10: 2 mL

## 2020-11-10 MED ORDER — TRIAMCINOLONE ACETONIDE 40 MG/ML IJ SUSP
INTRAMUSCULAR | Status: AC
  Filled 2020-11-10: qty 1

## 2020-11-10 MED ORDER — SODIUM CHLORIDE (PF) 0.9 % IJ SOLN
INTRAMUSCULAR | Status: AC
  Filled 2020-11-10: qty 10

## 2020-11-10 MED ORDER — ROPIVACAINE HCL 2 MG/ML IJ SOLN
INTRAMUSCULAR | Status: AC
  Filled 2020-11-10: qty 10

## 2020-11-10 MED ORDER — DEXAMETHASONE SODIUM PHOSPHATE 10 MG/ML IJ SOLN
INTRAMUSCULAR | Status: AC
  Filled 2020-11-10: qty 1

## 2020-11-10 MED ORDER — MIDAZOLAM HCL 5 MG/5ML IJ SOLN
INTRAMUSCULAR | Status: AC
  Filled 2020-11-10: qty 5

## 2020-11-10 MED ORDER — DEXAMETHASONE SODIUM PHOSPHATE 10 MG/ML IJ SOLN
10.0000 mg | Freq: Once | INTRAMUSCULAR | Status: AC
  Administered 2020-11-10: 10 mg

## 2020-11-10 MED ORDER — ROPIVACAINE HCL 2 MG/ML IJ SOLN
2.0000 mL | Freq: Once | INTRAMUSCULAR | Status: AC
  Administered 2020-11-10: 2 mL via EPIDURAL

## 2020-11-10 MED ORDER — MIDAZOLAM HCL 5 MG/5ML IJ SOLN
1.0000 mg | INTRAMUSCULAR | Status: DC | PRN
  Administered 2020-11-10: 1 mg via INTRAVENOUS

## 2020-11-10 MED ORDER — LIDOCAINE HCL 2 % IJ SOLN
20.0000 mL | Freq: Once | INTRAMUSCULAR | Status: AC
  Administered 2020-11-10: 200 mg

## 2020-11-10 MED ORDER — FENTANYL CITRATE (PF) 100 MCG/2ML IJ SOLN
25.0000 ug | INTRAMUSCULAR | Status: DC | PRN
  Administered 2020-11-10: 50 ug via INTRAVENOUS

## 2020-11-10 MED ORDER — LIDOCAINE HCL 2 % IJ SOLN
INTRAMUSCULAR | Status: AC
  Filled 2020-11-10: qty 40

## 2020-11-10 MED ORDER — FENTANYL CITRATE (PF) 100 MCG/2ML IJ SOLN
INTRAMUSCULAR | Status: AC
  Filled 2020-11-10: qty 2

## 2020-11-10 NOTE — Telephone Encounter (Signed)
[employer] is calling the patient back.

## 2020-11-10 NOTE — Patient Instructions (Addendum)
____________________________________________________________________________________________  Post-Procedure Discharge Instructions  Instructions:  Apply ice:   Purpose: This will minimize any swelling and discomfort after procedure.   When: Day of procedure, as soon as you get home.  How: Fill a plastic sandwich bag with crushed ice. Cover it with a small towel and apply to injection site.  How long: (15 min on, 15 min off) Apply for 15 minutes then remove x 15 minutes.  Repeat sequence on day of procedure, until you go to bed.  Apply heat:   Purpose: To treat any soreness and discomfort from the procedure.  When: Starting the next day after the procedure.  How: Apply heat to procedure site starting the day following the procedure.  How long: May continue to repeat daily, until discomfort goes away.  Food intake: Start with clear liquids (like water) and advance to regular food, as tolerated.   Physical activities: Keep activities to a minimum for the first 8 hours after the procedure. After that, then as tolerated.  Driving: If you have received any sedation, be responsible and do not drive. You are not allowed to drive for 24 hours after having sedation.  Blood thinner: (Applies only to those taking blood thinners) You may restart your blood thinner 6 hours after your procedure.  Insulin: (Applies only to Diabetic patients taking insulin) As soon as you can eat, you may resume your normal dosing schedule.  Infection prevention: Keep procedure site clean and dry. Shower daily and clean area with soap and water.  Post-procedure Pain Diary: Extremely important that this be done correctly and accurately. Recorded information will be used to determine the next step in treatment. For the purpose of accuracy, follow these rules:  Evaluate only the area treated. Do not report or include pain from an untreated area. For the purpose of this evaluation, ignore all other areas of pain,  except for the treated area.  After your procedure, avoid taking a long nap and attempting to complete the pain diary after you wake up. Instead, set your alarm clock to go off every hour, on the hour, for the initial 8 hours after the procedure. Document the duration of the numbing medicine, and the relief you are getting from it.  Do not go to sleep and attempt to complete it later. It will not be accurate. If you received sedation, it is likely that you were given a medication that may cause amnesia. Because of this, completing the diary at a later time may cause the information to be inaccurate. This information is needed to plan your care.  Follow-up appointment: Keep your post-procedure follow-up evaluation appointment after the procedure (usually 2 weeks for most procedures, 6 weeks for radiofrequencies). DO NOT FORGET to bring you pain diary with you.   Expect: (What should I expect to see with my procedure?)  From numbing medicine (AKA: Local Anesthetics): Numbness or decrease in pain. You may also experience some weakness, which if present, could last for the duration of the local anesthetic.  Onset: Full effect within 15 minutes of injected.  Duration: It will depend on the type of local anesthetic used. On the average, 1 to 8 hours.   From steroids (Applies only if steroids were used): Decrease in swelling or inflammation. Once inflammation is improved, relief of the pain will follow.  Onset of benefits: Depends on the amount of swelling present. The more swelling, the longer it will take for the benefits to be seen. In some cases, up to 10 days.    Duration: Steroids will stay in the system x 2 weeks. Duration of benefits will depend on multiple posibilities including persistent irritating factors.  Side-effects: If present, they may typically last 2 weeks (the duration of the steroids).  Frequent: Cramps (if they occur, drink Gatorade and take over-the-counter Magnesium 450-500 mg  once to twice a day); water retention with temporary weight gain; increases in blood sugar; decreased immune system response; increased appetite.  Occasional: Facial flushing (red, warm cheeks); mood swings; menstrual changes.  Uncommon: Long-term decrease or suppression of natural hormones; bone thinning. (These are more common with higher doses or more frequent use. This is why we prefer that our patients avoid having any injection therapies in other practices.)   Very Rare: Severe mood changes; psychosis; aseptic necrosis.  From procedure: Some discomfort is to be expected once the numbing medicine wears off. This should be minimal if ice and heat are applied as instructed.  Call if: (When should I call?)  You experience numbness and weakness that gets worse with time, as opposed to wearing off.  New onset bowel or bladder incontinence. (Applies only to procedures done in the spine)  Emergency Numbers:  Durning business hours (Monday - Thursday, 8:00 AM - 4:00 PM) (Friday, 9:00 AM - 12:00 Noon): [phone]  After hours: [phone]  NOTE: If you are having a problem and are unable connect with, or to talk to a provider, then go to your nearest urgent care or emergency department. If the problem is serious and urgent, please call 911. ____________________________________________________________________________________________   Pain Management Discharge Instructions  General Discharge Instructions :  If you need to reach your doctor call: Monday-Friday 8:00 am - 4:00 pm at [phone] or toll free [phone].  After clinic hours [phone] to have operator reach doctor.  Bring all of your medication bottles to all your appointments in the pain clinic.  To cancel or reschedule your appointment with Pain Management please remember to call 24 hours in advance to avoid a fee.  Refer to the educational materials which you have been given on: General Risks, I had my  Procedure. Discharge Instructions, Post Sedation.  Post Procedure Instructions:  The drugs you were given will stay in your system until tomorrow, so for the next 24 hours you should not drive, make any legal decisions or drink any alcoholic beverages.  You may eat anything you prefer, but it is better to start with liquids then soups and crackers, and gradually work up to solid foods.  Please notify your doctor immediately if you have any unusual bleeding, trouble breathing or pain that is not related to your normal pain.  Depending on the type of procedure that was done, some parts of your body may feel week and/or numb.  This usually clears up by tonight or the next day.  Walk with the use of an assistive device or accompanied by an adult for the 24 hours.  You may use ice on the affected area for the first 24 hours.  Put ice in a Ziploc bag and cover with a towel and place against area 15 minutes on 15 minutes off.  You may switch to heat after 24 hours. Facet Joint Block The facet joints connect the bones of the spine (vertebrae). They make it possible for you to bend, twist, and make other movements with your spine. They also keep you from bending too far, twisting too far, and making other extreme movements. A facet joint block is a procedure in  which a numbing medicine (anesthetic) is injected into a facet joint. In many cases, an anti-inflammatory medicine (steroid) is also injected. A facet joint block may be done:  To diagnose neck or back pain. If the pain gets better after a facet joint block, it means the pain is probably coming from the facet joint. If the pain does not get better, it means the pain is probably not coming from the facet joint.  To relieve neck or back pain that is caused by an inflamed facet joint. A facet joint block is only done to relieve pain if the pain does not improve with other methods, such as medicine, exercise programs, and physical therapy. Tell a  health care provider about:  Any allergies you have.  All medicines you are taking, including vitamins, herbs, eye drops, creams, and over-the-counter medicines.  Any problems you or family members have had with anesthetic medicines.  Any blood disorders you have.  Any surgeries you have had.  Any medical conditions you have or have had.  Whether you are pregnant or may be pregnant. What are the risks? Generally, this is a safe procedure. However, problems may occur, including:  Bleeding.  Injury to a nerve near the injection site.  Pain at the injection site.  Weakness or numbness in areas controlled by nerves near the injection site.  Infection.  Temporary fluid retention.  Allergic reactions to medicines or dyes.  Injury to other structures or organs near the injection site. What happens before the procedure? Medicines Ask your health care provider about:  Changing or stopping your regular medicines. This is especially important if you are taking diabetes medicines or blood thinners.  Taking medicines such as aspirin and ibuprofen. These medicines can thin your blood. Do not take these medicines unless your health care provider tells you to take them.  Taking over-the-counter medicines, vitamins, herbs, and supplements. Eating and drinking Follow instructions from your health care provider about eating and drinking, which may include:  8 hours before the procedure - stop eating heavy meals or foods, such as meat, fried foods, or fatty foods.  6 hours before the procedure - stop eating light meals or foods, such as toast or cereal.  6 hours before the procedure - stop drinking milk or drinks that contain milk.  2 hours before the procedure - stop drinking clear liquids. Staying hydrated Follow instructions from your health care provider about hydration, which may include:  Up to 2 hours before the procedure - you may continue to drink clear liquids, such as  water, clear fruit juice, black coffee, and plain tea. General instructions  Do not use any products that contain nicotine or tobacco for at least 4-6 weeks before the procedure. These products include cigarettes, e-cigarettes, and chewing tobacco. If you need help quitting, ask your health care provider.  Plan to have someone take you home from the hospital or clinic.  Ask your health care provider: ? How your surgery site will be marked. ? What steps will be taken to help prevent infection. These may include:  Removing hair at the surgery site.  Washing skin with a germ-killing soap.  Receiving antibiotic medicine. What happens during the procedure?  You will put on a hospital gown.  You will lie on your stomach on an X-ray table. You may be asked to lie in a different position if an injection will be made in your neck.  Machines will be used to monitor your oxygen levels, heart rate,  and blood pressure.  Your skin will be cleaned.  If an injection will be made in your neck, an IV will be inserted into one of your veins. Fluids and medicine will flow directly into your body through the IV.  A numbing medicine (local anesthetic) will be applied to your skin. Your skin may sting or burn for a moment.  A video X-ray machine (fluoroscopy) will be used to find the joint. In some cases, a CT scan may be used.  A contrast dye may be injected into the facet joint area to help find the joint.  When the joint is located, an anesthetic will be injected into the joint through the needle.  Your health care provider will ask you whether you feel pain relief. ? If you feel relief, a steroid may be injected to provide pain relief for a longer period of time. ? If you do not feel relief or feel only partial relief, additional injections of an anesthetic may be made in other facet joints.  The needle will be removed.  Your skin will be cleaned.  A bandage (dressing) will be applied over  each injection site. The procedure may vary among health care providers and hospitals.   What happens after the procedure?  Your blood pressure, heart rate, breathing rate, and blood oxygen level will be monitored until you leave the hospital or clinic.  You will lie down and rest for a period of time. Summary  A facet joint block is a procedure in which a numbing medicine (anesthetic) is injected into a facet joint. An anti-inflammatory medicine (stereoid) may also be injected.  Follow instructions from your health care provider about medicines and eating and drinking before the procedure.  Do not use any products that contain nicotine or tobacco for at least 4-6 weeks before the procedure.  You will lie on your stomach for the procedure, but you may be asked to lie in a different position if an injection will be made in your neck.  When the joint is located, an anesthetic will be injected into the joint through the needle. This information is not intended to replace advice given to you by your health care provider. Make sure you discuss any questions you have with your health care provider. Document Revised: 11/27/2018 Document Reviewed: 07/11/2018 [employer] Patient Education  2021 [employer].

## 2020-11-10 NOTE — Progress Notes (Signed)
PROVIDER NOTE: Information contained herein reflects review and annotations entered in association with encounter. Interpretation of such information and data should be left to medically-trained personnel. Information provided to patient can be located elsewhere in the medical record under "Patient Instructions". Document created using STT-dictation technology, any transcriptional errors that may result from process are unintentional.    Patient: [name] Douglas  Service Category: Procedure  Provider: Gaspar Cola, MD  DOB: 1941/06/18  DOS: 11/10/2020  Location: [facility]  MRN: [id number]  Setting: Ambulatory - outpatient  Referring Provider: Lavera Guise, MD  Type: Established Patient  Specialty: Interventional Pain Management  PCP: Lavera Guise, MD   Primary Reason for Visit: Interventional Pain Management Treatment. CC: Back Pain (Low right)  Procedure:          Anesthesia, Analgesia, Anxiolysis:  Type: Therapeutic Inter-Laminar Epidural Steroid Injection  #1  Region: Lumbar Level: L1-2 Level. Laterality: Right Paramedial  Type: Moderate (Conscious) Sedation combined with Local Anesthesia Indication(s): Analgesia and Anxiety Route: Intravenous (IV) IV Access: Secured Sedation: Meaningful verbal contact was maintained at all times during the procedure  Local Anesthetic: Lidocaine 1-2%  Position: Prone with head of the table was raised to facilitate breathing.   Indications: 1. Chronic thoracic back pain (Left)   2. Compression fracture of L1 lumbar vertebra, sequela   3. DDD (degenerative disc disease), thoracic   4. T12 compression fracture, sequela   5. Chronic anticoagulation (Eliquis)   6. Chronic obstructive pulmonary disease, unspecified COPD type ([place])   7. Dependence on supplemental oxygen   8. Chronic left-sided thoracic back pain    Pain Score: Pre-procedure: 8 /10 Post-procedure: 0-No pain/10   Pre-op H&P Assessment:  [name] Douglas is a 80  y.o. (year old), female patient, seen today for interventional treatment. She  has a past surgical history that includes Tonsillectomy (~ 1950); Excisional hemorrhoidectomy (1960's); Total knee arthroplasty (Left, 2008); Knee arthroscopy (Left, X 2 <2008); Vaginal hysterectomy (1974); Tubal ligation (1973); Cardiac catheterization (~ 2007); Shoulder open rotator cuff repair (Right, 2001); Eye surgery (Bilateral); Knee arthroscopy with subchondroplasty (Right, 04/19/2016); Total knee arthroplasty (Right, 07/03/2016); and LOOP RECORDER INSERTION (N/A, 01/02/2018). [name] Douglas has a current medication list which includes the following prescription(s): albuterol, apixaban, atorvastatin, vitamin b-12, cyclobenzaprine, docusate sodium, donepezil, ergocalciferol, gabapentin, levothyroxine, losartan, metoprolol succinate, oxycodone, and venlafaxine xr, and the following Facility-Administered Medications: fentanyl and midazolam. Her primarily concern today is the Back Pain (Low right)  Initial Vital Signs:  Pulse/HCG Rate: 74ECG Heart Rate: 77 Temp: (!) 97.1 F (36.2 C) Resp: 16 BP: (!) 159/46 SpO2: 96 %  BMI: Estimated body mass index is 36.56 kg/m as calculated from the following:   Height as of this encounter: 5\' 4"  (1.626 m).   Weight as of this encounter: 213 lb (96.6 kg).  Risk Assessment: Allergies: Reviewed. She is allergic to heparin.  Allergy Precautions: None required Coagulopathies: Reviewed. None identified.  Blood-thinner therapy: None at this time Active Infection(s): Reviewed. None identified. [name] Douglas is afebrile  Site Confirmation: [name] Douglas was asked to confirm the procedure and laterality before marking the site Procedure checklist: Completed Consent: Before the procedure and under the influence of no sedative(s), amnesic(s), or anxiolytics, the patient was informed of the treatment options, risks and possible complications. To fulfill our ethical and legal obligations, as  recommended by the American Medical Association's Code of Ethics, I have informed the patient of my clinical impression; the nature and purpose of the treatment or procedure;  the risks, benefits, and possible complications of the intervention; the alternatives, including doing nothing; the risk(s) and benefit(s) of the alternative treatment(s) or procedure(s); and the risk(s) and benefit(s) of doing nothing. The patient was provided information about the general risks and possible complications associated with the procedure. These may include, but are not limited to: failure to achieve desired goals, infection, bleeding, organ or nerve damage, allergic reactions, paralysis, and death. In addition, the patient was informed of those risks and complications associated to Spine-related procedures, such as failure to decrease pain; infection (i.e.: Meningitis, epidural or intraspinal abscess); bleeding (i.e.: epidural hematoma, subarachnoid hemorrhage, or any other type of intraspinal or peri-dural bleeding); organ or nerve damage (i.e.: Any type of peripheral nerve, nerve root, or spinal cord injury) with subsequent damage to sensory, motor, and/or autonomic systems, resulting in permanent pain, numbness, and/or weakness of one or several areas of the body; allergic reactions; (i.e.: anaphylactic reaction); and/or death. Furthermore, the patient was informed of those risks and complications associated with the medications. These include, but are not limited to: allergic reactions (i.e.: anaphylactic or anaphylactoid reaction(s)); adrenal axis suppression; blood sugar elevation that in diabetics may result in ketoacidosis or comma; water retention that in patients with history of congestive heart failure may result in shortness of breath, pulmonary edema, and decompensation with resultant heart failure; weight gain; swelling or edema; medication-induced neural toxicity; particulate matter embolism and blood vessel  occlusion with resultant organ, and/or nervous system infarction; and/or aseptic necrosis of one or more joints. Finally, the patient was informed that Medicine is not an exact science; therefore, there is also the possibility of unforeseen or unpredictable risks and/or possible complications that may result in a catastrophic outcome. The patient indicated having understood very clearly. We have given the patient no guarantees and we have made no promises. Enough time was given to the patient to ask questions, all of which were answered to the patient's satisfaction. Ms. Rota has indicated that she wanted to continue with the procedure. Attestation: I, the ordering provider, attest that I have discussed with the patient the benefits, risks, side-effects, alternatives, likelihood of achieving goals, and potential problems during recovery for the procedure that I have provided informed consent. Date  Time: 11/10/2020  8:59 AM  Pre-Procedure Preparation:  Monitoring: As per clinic protocol. Respiration, ETCO2, SpO2, BP, heart rate and rhythm monitor placed and checked for adequate function Safety Precautions: Patient was assessed for positional comfort and pressure points before starting the procedure. Time-out: I initiated and conducted the "Time-out" before starting the procedure, as per protocol. The patient was asked to participate by confirming the accuracy of the "Time Out" information. Verification of the correct person, site, and procedure were performed and confirmed by me, the nursing staff, and the patient. "Time-out" conducted as per Joint Commission's Universal Protocol (UP.01.01.01). Time: 0934  Description of Procedure:          Target Area: The interlaminar space, initially targeting the lower laminar border of the superior vertebral body. Approach: Paramedial approach. Area Prepped: Entire Posterior Lumbar Region DuraPrep (Iodine Povacrylex [0.7% available iodine] and Isopropyl  Alcohol, 74% w/w) Safety Precautions: Aspiration looking for blood return was conducted prior to all injections. At no point did we inject any substances, as a needle was being advanced. No attempts were made at seeking any paresthesias. Safe injection practices and needle disposal techniques used. Medications properly checked for expiration dates. SDV (single dose vial) medications used. Description of the Procedure: Protocol guidelines were followed.  The procedure needle was introduced through the skin, ipsilateral to the reported pain, and advanced to the target area. Bone was contacted and the needle walked caudad, until the lamina was cleared. The epidural space was identified using "loss-of-resistance technique" with 2-3 ml of PF-NaCl (0.9% NSS), in a 5cc LOR glass syringe.  Vitals:   11/10/20 0938 11/10/20 0948 11/10/20 0958 11/10/20 1000  BP: (!) 164/65 (!) 166/44 (!) 167/49 (!) 157/55  Pulse:      Resp: 13 15 16 18   Temp:      SpO2: 93% 97% 98% 99%  Weight:      Height:        Start Time: 0934 hrs. End Time: 0938 hrs.  Materials:  Needle(s) Type: Epidural needle Gauge: 17G Length: 3.5-in Medication(s): Please see orders for medications and dosing details.  Imaging Guidance (Spinal):          Type of Imaging Technique: Fluoroscopy Guidance (Spinal) Indication(s): Assistance in needle guidance and placement for procedures requiring needle placement in or near specific anatomical locations not easily accessible without such assistance. Exposure Time: Please see nurses notes. Contrast: Before injecting any contrast, we confirmed that the patient did not have an allergy to iodine, shellfish, or radiological contrast. Once satisfactory needle placement was completed at the desired level, radiological contrast was injected. Contrast injected under live fluoroscopy. No contrast complications. See chart for type and volume of contrast used. Fluoroscopic Guidance: I was personally present  during the use of fluoroscopy. "Tunnel Vision Technique" used to obtain the best possible view of the target area. Parallax error corrected before commencing the procedure. "Direction-depth-direction" technique used to introduce the needle under continuous pulsed fluoroscopy. Once target was reached, antero-posterior, oblique, and lateral fluoroscopic projection used confirm needle placement in all planes. Images permanently stored in EMR. Interpretation: I personally interpreted the imaging intraoperatively. Adequate needle placement confirmed in multiple planes. Appropriate spread of contrast into desired area was observed. No evidence of afferent or efferent intravascular uptake. No intrathecal or subarachnoid spread observed. Permanent images saved into the patient's record.  Antibiotic Prophylaxis:   Anti-infectives (From admission, onward)   None     Indication(s): None identified  Post-operative Assessment:  Post-procedure Vital Signs:  Pulse/HCG Rate: 7477 Temp: (!) 97.1 F (36.2 C) Resp: 18 BP: (!) 157/55 SpO2: 99 %  EBL: None  Complications: No immediate post-treatment complications observed by team, or reported by patient.  Note: The patient tolerated the entire procedure well. A repeat set of vitals were taken after the procedure and the patient was kept under observation following institutional policy, for this type of procedure. Post-procedural neurological assessment was performed, showing return to baseline, prior to discharge. The patient was provided with post-procedure discharge instructions, including a section on how to identify potential problems. Should any problems arise concerning this procedure, the patient was given instructions to immediately contact us, at any time, without hesitation. In any case, we plan to contact the patient by telephone for a follow-up status report regarding this interventional procedure.  Comments:  No additional relevant  information.  Plan of Care  Orders:  Orders Placed This Encounter  Procedures  . Lumbar Epidural Injection    Scheduling Instructions:     Procedure: Interlaminar LESI L1-2     Laterality: Right-sided     Sedation: With Sedation     Timeframe:  Today    Order Specific Question:   Where will this procedure be performed?    Answer:   [facility]  .  DG PA[facility] C-ARM 1-60 MIN NO REPORT    Intraoperative interpretation by procedural physician at [place].    Standing Status:   Standing    Number of Occurrences:   1    Order Specific Question:   Reason for exam:    Answer:   Assistance in needle guidance and placement for procedures requiring needle placement in or near specific anatomical locations not easily accessible without such assistance.  . Care order/instruction: Please confirm that the patient has stopped the Eliquis (Apixaban) x 3 days prior to procedure or surgery.    Please confirm that the patient has stopped the Eliquis (Apixaban) x 3 days prior to procedure or surgery.    Standing Status:   Standing    Number of Occurrences:   1  . Provide equipment / supplies at bedside    "Epidural Tray" (Disposable  single use) Catheter: NOT required    Standing Status:   Standing    Number of Occurrences:   1    Order Specific Question:   Specify    Answer:   Epidural Tray  . Monitor O2 SATs    Monitor O2 SATs during and after procedure. Administer supplemental O2 if saturation decreases with sedation.    Standing Status:   Standing    Number of Occurrences:   1  . Informed Consent Details: Physician/Practitioner Attestation; Transcribe to consent form and obtain patient signature    Note: Always confirm laterality of pain with Ms. Piatt, before procedure. Transcribe to consent form and obtain patient signature.    Order Specific Question:   Physician/Practitioner attestation of informed consent for procedure/surgical case    Answer:   I, the  physician/practitioner, attest that I have discussed with the patient the benefits, risks, side effects, alternatives, likelihood of achieving goals and potential problems during recovery for the procedure that I have provided informed consent.    Order Specific Question:   Procedure    Answer:   Lumbar epidural steroid injection under fluoroscopic guidance    Order Specific Question:   Physician/Practitioner performing the procedure    Answer:   Aldena Worm A. Dossie Arbour, MD    Order Specific Question:   Indication/Reason    Answer:   Low back and/or lower extremity pain secondary to lumbar radiculitis  . Oxygen therapy Mode or (Route): Nasal cannula; FiO2: 21%; Liters Per Minute: 2; Keep 02 saturation: Above 90% for COPD and above 92% for non-COPD patients    Always have available and administer if patient has a history of COPD, coronary artery disease, oxygen dependence, and/or history of sleep apnea.    Standing Status:   Standing    Number of Occurrences:   [phone]    Order Specific Question:   Mode or (Route)    Answer:   Nasal cannula    Order Specific Question:   FiO2    Answer:   21%    Order Specific Question:   Liters Per Minute    Answer:   2    Order Specific Question:   Keep 02 saturation    Answer:   Above 90% for COPD and above 92% for non-COPD patients  . Bleeding precautions    Standing Status:   Standing    Number of Occurrences:   1   Chronic Opioid Analgesic:  Hydrocodone/APAP 5/325 1 tablet p.o. twice daily (10 mg/day of hydrocodone)  MME/day: 10 mg/day   Medications ordered for procedure: Meds ordered this encounter  Medications  .  iohexol (OMNIPAQUE) 180 MG/ML injection 10 mL    Must be Myelogram-compatible. If not available, you may substitute with a water-soluble, non-ionic, hypoallergenic, myelogram-compatible radiological contrast medium.  Marland Kitchen lidocaine (XYLOCAINE) 2 % (with pres) injection 400 mg  . lactated ringers infusion 1,000 mL  . midazolam (VERSED) 5 MG/5ML  injection 1-2 mg    Make sure Flumazenil is available in the pyxis when using this medication. If oversedation occurs, administer 0.2 mg IV over 15 sec. If after 45 sec no response, administer 0.2 mg again over 1 min; may repeat at 1 min intervals; not to exceed 4 doses (1 mg)  . fentaNYL (SUBLIMAZE) injection 25-50 mcg    Make sure Narcan is available in the pyxis when using this medication. In the event of respiratory depression (RR< 8/min): Titrate NARCAN (naloxone) in increments of 0.1 to 0.2 mg IV at 2-3 minute intervals, until desired degree of reversal.  . sodium chloride flush (NS) 0.9 % injection 2 mL  . ropivacaine (PF) 2 mg/mL (0.2%) (NAROPIN) injection 2 mL  . dexamethasone (DECADRON) injection 10 mg   Medications administered: We administered iohexol, lidocaine, lactated ringers, midazolam, fentaNYL, sodium chloride flush, ropivacaine (PF) 2 mg/mL (0.2%), and dexamethasone.  See the medical record for exact dosing, route, and time of administration.  Follow-up plan:   Return in about 2 weeks (around 11/24/2020) for on afternoon of procedure day, (F2F), (PPE).       Interventional Therapies  Risk  Complexity Considerations:   ELIQUIS Anticoagulation (Stop: 3 days  Restart: 6 hrs) Prior patient of [facility] (Dr. Alba Destine)   Planned  Pending:   Diagnostic right L1-2 LESI #1 (11/10/2020)    Under consideration:      Completed:   Diagnostic/therapeutic right L1 TFESI #1 (09/27/2020) Diagnostic/therapeutic right L2 TFESI #1 (09/27/2020)  Diagnostic/therapeutic left L4-5 LESI #1 (09/27/2020)   Completed at the [facility] by Girtha Hake, MD (PMR): Right L5 TFESI #1 (05/05/2020) - 7 days of pain relief. Bilateral L5 TFESI #2 (06/09/2020) - 24 to 48 hours of pain relief.   Therapeutic  Palliative (PRN) options:   None established    Recent Visits Date Type Provider Dept  10/10/20 Office Visit Milinda Pointer, MD [facility]  09/27/20 Procedure  visit Milinda Pointer, MD [facility]  09/19/20 Office Visit Milinda Pointer, MD [facility]  09/07/20 Office Visit Milinda Pointer, MD [facility]  Showing recent visits within past 90 days and meeting all other requirements Today's Visits Date Type Provider Dept  11/10/20 Procedure visit Milinda Pointer, MD [facility]  Showing today's visits and meeting all other requirements Future Appointments Date Type Provider Dept  11/29/20 Appointment Milinda Pointer, MD [facility]  Showing future appointments within next 90 days and meeting all other requirements  Disposition: Discharge home  Discharge (Date  Time): 11/10/2020; 1020 hrs.   Primary Care Physician: Lavera Guise, MD Location: [facility] Note by: Gaspar Cola, MD Date: 11/10/2020; Time: 10:48 AM  Disclaimer:  Medicine is not an Chief Strategy Officer. The only guarantee in medicine is that nothing is guaranteed. It is important to note that the decision to proceed with this intervention was based on the information collected from the patient. The Data and conclusions were drawn from the patient's questionnaire, the interview, and the physical examination. Because the information was provided in large part by the patient, it cannot be guaranteed that it has not been purposely or unconsciously manipulated.  Every effort has been made to obtain as much relevant data as possible for this evaluation. It is important to note that the conclusions that lead to this procedure are derived in large part from the available data. Always take into account that the treatment will also be dependent on availability of resources and existing treatment guidelines, considered by other Pain Management Practitioners as being common knowledge and practice, at the time of the intervention. For Medico-Legal purposes, it is also important to point out that variation in  procedural techniques and pharmacological choices are the acceptable norm. The indications, contraindications, technique, and results of the above procedure should only be interpreted and judged by a Board-Certified Interventional Pain Specialist with extensive familiarity and expertise in the same exact procedure and technique.

## 2020-11-10 NOTE — Telephone Encounter (Signed)
The patient received 3230 error code. We waited one minute and tried again. She received the error code 3248. I called tech support and no one answered after 10 minutes. I told her I will call tech support later on today and order her handheld for her monitor.

## 2020-11-10 NOTE — Progress Notes (Signed)
Safety precautions to be maintained throughout the outpatient stay will include: orient to surroundings, keep bed in low position, maintain call bell within reach at all times, provide assistance with transfer out of bed and ambulation.  

## 2020-11-11 ENCOUNTER — Telehealth: Payer: Self-pay

## 2020-11-11 ENCOUNTER — Ambulatory Visit: Payer: Medicare Other | Admitting: Hospice and Palliative Medicine

## 2020-11-11 DIAGNOSIS — S32059S Unspecified fracture of fifth lumbar vertebra, sequela: Secondary | ICD-10-CM | POA: Diagnosis not present

## 2020-11-11 DIAGNOSIS — M5459 Other low back pain: Secondary | ICD-10-CM | POA: Diagnosis not present

## 2020-11-11 DIAGNOSIS — G459 Transient cerebral ischemic attack, unspecified: Secondary | ICD-10-CM | POA: Diagnosis not present

## 2020-11-11 DIAGNOSIS — M546 Pain in thoracic spine: Secondary | ICD-10-CM | POA: Diagnosis not present

## 2020-11-11 DIAGNOSIS — G894 Chronic pain syndrome: Secondary | ICD-10-CM | POA: Diagnosis not present

## 2020-11-11 DIAGNOSIS — M5117 Intervertebral disc disorders with radiculopathy, lumbosacral region: Secondary | ICD-10-CM | POA: Diagnosis not present

## 2020-11-11 NOTE — Telephone Encounter (Signed)
Post procedure phone call.  Patient states she is doing well.  

## 2020-11-14 ENCOUNTER — Telehealth: Payer: Self-pay

## 2020-11-14 NOTE — Telephone Encounter (Signed)
Spoke with daughter Larene Beach.  Explained to daughter that it could take up to 4-10 days for the steroid to start working.  States that the pain is the same pain as prior to procedure.  States patient is using heat.  States she just wanted to make sure that she needed to give the steroid time to work.  Instructed daughter to call  us back for any further questions or concerns.

## 2020-11-14 NOTE — Telephone Encounter (Signed)
Pt had a procedure 11/10/20 and is still In pain pt have been using the heat pad and taking her medication, pain is in her back and would like to know what else can she do she's having trouble sleeping.

## 2020-11-15 ENCOUNTER — Ambulatory Visit (INDEPENDENT_AMBULATORY_CARE_PROVIDER_SITE_OTHER): Payer: Medicare Other

## 2020-11-15 DIAGNOSIS — I639 Cerebral infarction, unspecified: Secondary | ICD-10-CM | POA: Diagnosis not present

## 2020-11-15 LAB — CUP PACEART REMOTE DEVICE CHECK
Date Time Interrogation Session: 20220329001802
Implantable Pulse Generator Implant Date: 20190516

## 2020-11-18 ENCOUNTER — Telehealth: Payer: Self-pay

## 2020-11-18 NOTE — Telephone Encounter (Signed)
Pt states her pain is not better since the shot and the burning and tingling that was on the right side is now on the left side

## 2020-11-18 NOTE — Telephone Encounter (Signed)
Please put that on her list of phone numbers with the message

## 2020-11-18 NOTE — Telephone Encounter (Signed)
If you call during the week please call [phone]

## 2020-11-21 ENCOUNTER — Telehealth: Payer: Self-pay

## 2020-11-21 NOTE — Telephone Encounter (Signed)
Called and spoke with daughter re; Mrs Severa and her increased pain.  Pain is great that she is unable to standup straight and pretty much is laying on heating pad to try and help the pain.  Larene Beach is wondering if the fall that her mother had affected the outcome of the injections, since the first injections worked really well. Her mother was assessed after the fall but she states they were only concerned with shoulder. She is concerned that something has been displaced,interrupted in the back and that maybe what is creating the severe pain.  She states her mothers pain is a burning pain from low back up to mid back and pain is now in both legs instead of just the right.    I told her that I feel it may be prudent to have Dr Dossie Arbour see her mother to assess if there is something more that can be done.  Sent up to scheduling for appt with DR Salina April for evaluation.

## 2020-11-21 NOTE — Telephone Encounter (Signed)
[name] Douglas her daughter called and stated that her mother is in a lot of pain the procedure did not help her. Her mom can barely walk or stand up straight from the pain it is day 10 since procedure.

## 2020-11-22 ENCOUNTER — Encounter: Payer: Self-pay | Admitting: Pain Medicine

## 2020-11-22 ENCOUNTER — Ambulatory Visit: Payer: Medicare Other | Attending: Pain Medicine | Admitting: Pain Medicine

## 2020-11-22 ENCOUNTER — Other Ambulatory Visit: Payer: Self-pay

## 2020-11-22 VITALS — BP 162/56 | HR 71 | Temp 97.0°F | Resp 16 | Ht 64.0 in | Wt 213.0 lb

## 2020-11-22 DIAGNOSIS — G8929 Other chronic pain: Secondary | ICD-10-CM

## 2020-11-22 DIAGNOSIS — S32010S Wedge compression fracture of first lumbar vertebra, sequela: Secondary | ICD-10-CM

## 2020-11-22 DIAGNOSIS — M48062 Spinal stenosis, lumbar region with neurogenic claudication: Secondary | ICD-10-CM | POA: Diagnosis not present

## 2020-11-22 DIAGNOSIS — M5417 Radiculopathy, lumbosacral region: Secondary | ICD-10-CM | POA: Diagnosis not present

## 2020-11-22 DIAGNOSIS — M48061 Spinal stenosis, lumbar region without neurogenic claudication: Secondary | ICD-10-CM | POA: Diagnosis not present

## 2020-11-22 DIAGNOSIS — G894 Chronic pain syndrome: Secondary | ICD-10-CM | POA: Diagnosis not present

## 2020-11-22 DIAGNOSIS — Z7901 Long term (current) use of anticoagulants: Secondary | ICD-10-CM

## 2020-11-22 DIAGNOSIS — M546 Pain in thoracic spine: Secondary | ICD-10-CM | POA: Diagnosis not present

## 2020-11-22 DIAGNOSIS — R937 Abnormal findings on diagnostic imaging of other parts of musculoskeletal system: Secondary | ICD-10-CM | POA: Diagnosis not present

## 2020-11-22 DIAGNOSIS — M5416 Radiculopathy, lumbar region: Secondary | ICD-10-CM | POA: Diagnosis not present

## 2020-11-22 DIAGNOSIS — I639 Cerebral infarction, unspecified: Secondary | ICD-10-CM

## 2020-11-22 DIAGNOSIS — S22080S Wedge compression fracture of T11-T12 vertebra, sequela: Secondary | ICD-10-CM

## 2020-11-22 DIAGNOSIS — M5135 Other intervertebral disc degeneration, thoracolumbar region: Secondary | ICD-10-CM

## 2020-11-22 NOTE — Patient Instructions (Addendum)
____________________________________________________________________________________________  Medication Evaluation  Purpose: The purpose of these questions is to establish the onset of effects (speed of absorption), peak benefit (effectiveness), side-effects (ability to tolerate), and duration (excretion/metabolism) of the prescribed medication. The results will help the healthcare provider decide what to do with the medication in question.  Please indicate:  1. Time to onset of benefits: The amount of time it takes for you to begin perceiving any benefits after taking (swallowing) your pain medicine. _______ minutes.  2. Time to peak effect: The time it takes between taking medicine (swallowing it) and the moment when you feel the most benefit (peak effect) from the medicine. _______ minutes.  3. Peak benefit: Please quantify the amount of relief you obtain from the medicine at the moment it is working the best. Does you pain completely go away (100% gone)? Is it three quarters (3/4) better (75% relief)? Does half of your pain go away (50% benefit)? Is it a third better (33% improved)? Or does it go down only by a fourth (25% better)? _______ % relief.  4. Duration of benefit: The time it takes for all benefits of the medicine to be gone. This period of time starts when you first swallow your pain pill and ends when you feel that your pain has increased to the point where you absolutely need to take another pill. _______ hours and _______ minutes.  5. Adverse reactions: These are side effects and/or adverse reactions you experience since taking the medicine or only when taking your pain medicine. Please circle any and all that apply:  a. Allergic reactions: itching; hives; generalized redness; swelling of the tongue; swelling of the eyes; difficulty breathing. b. Neurological Intolerance: cognitive impairment (difficulty thinking clearly; memory problems; difficulty remembering things; slurred  speech); oversedation; sleepiness: unsteadiness; difficulty walking. c. Gastrointestinal problems: constipation; nausea; vomiting; dry heaves; decreased appetite. d. Hormonal problems: decreased sex drive; erection problems; abnormal menstrual period; weight gain or inability to lose weight; weakness or low energy; hair loss.  What to do: When completed, please bring back to your next visit. Please give it to the admitting nurse.  (Last updated: 01/01/2019) ____________________________________________________________________________________________   ____________________________________________________________________________________________  Preparing for your procedure (without sedation)  Procedure appointments are limited to planned procedures: . No Prescription Refills. . No disability issues will be discussed. . No medication changes will be discussed.  Instructions: . Oral Intake: Do not eat or drink anything for at least 6 hours prior to your procedure. (Exception: Blood Pressure Medication. See below.) . Transportation: Unless otherwise stated by your physician, you may drive yourself after the procedure. . Blood Pressure Medicine: Do not forget to take your blood pressure medicine with a sip of water the morning of the procedure. If your Diastolic (lower reading)is above 100 mmHg, elective cases will be cancelled/rescheduled. . Blood thinners: These will need to be stopped for procedures. Notify our staff if you are taking any blood thinners. Depending on which one you take, there will be specific instructions on how and when to stop it. . Diabetics on insulin: Notify the staff so that you can be scheduled 1st case in the morning. If your diabetes requires high dose insulin, take only  of your normal insulin dose the morning of the procedure and notify the staff that you have done so. . Preventing infections: Shower with an antibacterial soap the morning of your procedure.   . Build-up your immune system: Take 1000 mg of Vitamin C with every meal (3 times a day) the  day prior to your procedure. Marland Kitchen Antibiotics: Inform the staff if you have a condition or reason that requires you to take antibiotics before dental procedures. . Pregnancy: If you are pregnant, call and cancel the procedure. . Sickness: If you have a cold, fever, or any active infections, call and cancel the procedure. . Arrival: You must be in the facility at least 30 minutes prior to your scheduled procedure. . Children: Do not bring any children with you. . Dress appropriately: Bring dark clothing that you would not mind if they get stained. . Valuables: Do not bring any jewelry or valuables.  Reasons to call and reschedule or cancel your procedure: (Following these recommendations will minimize the risk of a serious complication.) . Surgeries: Avoid having procedures within 2 weeks of any surgery. (Avoid for 2 weeks before or after any surgery). . Flu Shots: Avoid having procedures within 2 weeks of a flu shots or . (Avoid for 2 weeks before or after immunizations). . Barium: Avoid having a procedure within 7-10 days after having had a radiological study involving the use of radiological contrast. (Myelograms, Barium swallow or enema study). . Heart attacks: Avoid any elective procedures or surgeries for the initial 6 months after a "Myocardial Infarction" (Heart Attack). . Blood thinners: It is imperative that you stop these medications before procedures. Let us know if you if you take any blood thinner.  . Infection: Avoid procedures during or within two weeks of an infection (including chest colds or gastrointestinal problems). Symptoms associated with infections include: Localized redness, fever, chills, night sweats or profuse sweating, burning sensation when voiding, cough, congestion, stuffiness, runny nose, sore throat, diarrhea, nausea, vomiting, cold or Flu symptoms, recent or current  infections. It is specially important if the infection is over the area that we intend to treat. Marland Kitchen Heart and lung problems: Symptoms that may suggest an active cardiopulmonary problem include: cough, chest pain, breathing difficulties or shortness of breath, dizziness, ankle swelling, uncontrolled high or unusually low blood pressure, and/or palpitations. If you are experiencing any of these symptoms, cancel your procedure and contact your primary care physician for an evaluation.  Remember:  Regular Business hours are:  Monday to Thursday 8:00 AM to 4:00 PM  Provider's Schedule: Milinda Pointer, MD:  Procedure days: Tuesday and Thursday 7:30 AM to 4:00 PM  Gillis Santa, MD:  Procedure days: Monday and Wednesday 7:30 AM to 4:00 PM ____________________________________________________________________________________________

## 2020-11-22 NOTE — Progress Notes (Signed)
Safety precautions to be maintained throughout the outpatient stay will include: orient to surroundings, keep bed in low position, maintain call bell within reach at all times, provide assistance with transfer out of bed and ambulation.  

## 2020-11-22 NOTE — Progress Notes (Signed)
PROVIDER NOTE: Information contained herein reflects review and annotations entered in association with encounter. Interpretation of such information and data should be left to medically-trained personnel. Information provided to patient can be located elsewhere in the medical record under "Patient Instructions". Document created using STT-dictation technology, any transcriptional errors that may result from process are unintentional.    Patient: [name] Douglas  Service Category: E/M  Provider: Gaspar Cola, MD  DOB: [birth year]  DOS: 11/22/2020  Specialty: Interventional Pain Management  MRN: [id number]  Setting: Ambulatory outpatient  PCP: Lavera Guise, MD  Type: Established Patient    Referring Provider: Lavera Guise, MD  Location: Office  Delivery: Face-to-face     HPI  [name], a 80 y.o. year old female, is here today because of her Spinal stenosis, lumbar region, with neurogenic claudication [M48.062]. Ms. Hartner primary complain today is Leg Pain Last encounter: My last encounter with her was on 11/10/2020. Pertinent problems: [name] has Primary localized osteoarthritis of right knee; Intractable episodic cluster headache; Other symptoms and signs involving the nervous system; Muscle spasm; Headache disorder; Intractable low back pain; Fall down stairs; Lumbar central spinal stenosis (Moderate: L2-3  Severe: L3-4, L4-5) w/ neurogenic claudication; Chronic pain syndrome; Chronic low back pain (2ry area of Pain) (Bilateral) (R>L) w/ sciatica (Right); Chronic lower extremity pain (1ry area of Pain) (Right); Chronic lumbar radiculopathy (Right); Lumbosacral radiculopathy at L2 (Right); Lumbar radiculitis (L2 dermatome) (Right); Unspecified inflammatory spondylopathy, lumbar region [facility]); Abnormal MRI, lumbar spine (04/20/2020); Lumbar foraminal stenosis (Bilateral: L2-3, L3-4, L4-5) (Right: L5-S1) (Severe left L4-5); Grade 1 Lumbar Anterolisthesis (L3/4 and L4/5); Grade 1 Lumbar  Retrolisthesis (L2/3); Protrusion of lumbar intervertebral disc (Multilevel); Compression fracture of L1 lumbar vertebra, sequela; Other intervertebral disc degeneration, lumbar region; Lumbosacral facet syndrome (Multilevel) (Bilateral); Lumbar facet hypertrophy (Multilevel) (Bilateral); Ligamentum flavum hypertrophy; Lumbar lateral recess stenosis (Right: L2-3); DDD (degenerative disc disease), lumbosacral; Compression fracture of L5 vertebra ([place]); Osteoporosis with pathological fracture of lumbar vertebra ([place]); Chronic thoracic back pain (Left); T12 compression fracture, sequela; Left shoulder pain; Presence of artificial knee joint, bilateral; Other chronic pain; DDD (degenerative disc disease), thoracic; and DDD (degenerative disc disease), thoracolumbar on their pertinent problem list. Pain Assessment: Severity of Chronic pain is reported as a 10-Worst pain ever/10. Location: Hip Right/right hip/buttock down side of leg to knee, states left side is not bothering her now. Onset:  . Quality: Aching,Burning. Timing:  . Modifying factor(s): Medications not help. heat, applies heat but does not help when she gets up, patient has to hold area and put pressure on it.. Vitals:  height is _0  (1.626 m) and weight is 213 lb (96.6 kg). Her temperature is 97 F (36.1 C) (abnormal). Her blood pressure is 162/56 (abnormal) and her pulse is 71. Her respiration is 16 and oxygen saturation is 97%.   Reason for encounter: post-procedure assessment.  The right-sided L1-2 LESI did not provide the patient with the same type of benefits that she attained with the first procedure where we did a right-sided L1 and L2 transforaminal epidural steroid injections in addition to a left sided L4-5 LESI.  That combination provided the patient with 100% relief of the pain that lasted for almost 10 days.  Unfortunately, she had a fall which triggered all this pain to return.  The patient has an MRI that was done on 04/20/2020 and it  shows an L2-3 disc extrusion, as well as severe central spinal stenosis at the L3-4 and L4-5  levels with various degrees of foraminal and lateral recess stenosis.  In all honesty, the only thing that could solve the established problem would be a decompressive surgery.  The patient's neurosurgeon is Dr. Deetta Perla.  She was apparently scheduled for a right-sided L2-3 hemilaminectomy, as well as an L3-5 laminectomy on 07/25/2020, but this was canceled.  Apparently he has been determined that the patient is a very high risk candidate for surgery.  Apparently the patient has been already evaluated twice by Dr. Lacinda Axon whom decided that her risk was too high for general anesthesia.  The patient comes into the clinic today with her family and they had several questions about what can be done.  Today I spent over an hour with them showing them the MRI films and going over the results of the procedures that we had done.  I was very clear that nothing that I have to offer is going to provide her with any long-term benefit, when he comes to injections.  They asked me about medications and I have explained to them that the problems that we face here is the fact that she is 80 years old and medications can affect her adversely causing cognitive impairment and worsening her ability to ambulate.  To make matters worse, she lives by herself.  She has been getting some pain medication from her primary care physician in the form of oxycodone IR 5 mg, but the patient has not taken it because she has found out that they did not help with the pain but do make her very sleepy.  This is exactly what I would expect to see with opioids in the treatment of neuropathic pain.  Opioids are no good for this type of pain and typically you can see side effects before you see any benefit.  This is the case with [name].  I have explained to them that the only medications that may provide her with some benefit would be the membrane  stabilizers and we only have pregabalin (Lyrica) and gabapentin (Neurontin).  She is currently taking gabapentin 200 mg p.o. 3 times daily and 1 possible alternative is to slowly titrate her up as tolerated.  Since this is being prescribed by her primary care physician, I recommended that they consult Dr. Clayborn Bigness, but before making any changes.  Having looked at the patient has MRI films, I see that above the T12 level, she does seem to have a little bit more space within the spinal canal.  This would suggest that there is the possibility of inserting an intrathecal catheter and running medication in that area to help with the pain.  Today I spent a lot of time talking to them about the intrathecal pumps and we have decided to go ahead and schedule her for an intrathecal injection of fentanyl to determine if she can get any benefit and whether or not it gives her any side effects.  We talked about the side effects such as pruritus and urinary retention and I also took the opportunity to talk to them about the very real possibility of neurogenic bladder as a consequence of the compression of the cord.  When evaluating the films today I measured the diameter of the canal at about the L3-4 level and it seemed to be less than 7.5 mm in diameter.  Studies have shown that almost 100% of patients will have incontinence when the diameter of the canal reaches 5 mm.  Today I also took the time to  explain to them that even if I could provide her with some relief of the pain, this would not help the weakness in the lower extremities since this is secondary to nerve compression within the spinal canal, which would not be relieved by the infusion of medications intrathecally.  Today the patient and family were provided with information regarding the intrathecal pumps.  Post-Procedure Evaluation  Procedure (11/10/2020): Diagnostic/therapeutic right L1-2 LESI #1 under fluoroscopic guidance and IV sedation Pre-procedure  pain level: 8/10 Post-procedure: 0/10 (100% relief)  Sedation: Sedation provided.  Effectiveness during initial hour after procedure(Ultra-Short Term Relief): 100 %.  Local anesthetic used: Long-acting (4-6 hours) Effectiveness: Defined as any analgesic benefit obtained secondary to the administration of local anesthetics. This carries significant diagnostic value as to the etiological location, or anatomical origin, of the pain. Duration of benefit is expected to coincide with the duration of the local anesthetic used.  Effectiveness during initial 4-6 hours after procedure(Short-Term Relief): 0 %.  Long-term benefit: Defined as any relief past the pharmacologic duration of the local anesthetics.  Effectiveness past the initial 6 hours after procedure(Long-Term Relief): 0 %.  Current benefits: Defined as benefit that persist at this time.   Analgesia:  Back to baseline Function: Back to baseline ROM: Back to baseline  Pharmacotherapy Assessment   Analgesic: Hydrocodone/APAP 5/325 1 tablet p.o. twice daily (10 mg/day of hydrocodone)  MME/day: 10 mg/day   Monitoring: [place] PMP: PDMP reviewed during this encounter.       Pharmacotherapy: No side-effects or adverse reactions reported. Compliance: No problems identified. Effectiveness: Clinically acceptable.  Ignatius Specking, RN  11/22/2020  2:42 PM  Sign when Signing Visit Safety precautions to be maintained throughout the outpatient stay will include: orient to surroundings, keep bed in low position, maintain call bell within reach at all times, provide assistance with transfer out of bed and ambulation.     UDS:  Summary  Date Value Ref Range Status  09/19/2020 Note  Final    Comment:    ==================================================================== Compliance Drug Analysis, Ur ==================================================================== Test                             Result       Flag       Units  Drug Present  and Declared for Prescription Verification   Hydrocodone                    642          EXPECTED   ng/mg creat   Hydromorphone                  189          EXPECTED   ng/mg creat   Dihydrocodeine                 108          EXPECTED   ng/mg creat   Norhydrocodone                 971          EXPECTED   ng/mg creat    Sources of hydrocodone include scheduled prescription medications.    Hydromorphone, dihydrocodeine and norhydrocodone are expected    metabolites of hydrocodone. Hydromorphone and dihydrocodeine are    also available as scheduled prescription medications.    Gabapentin  PRESENT      EXPECTED   Venlafaxine                    PRESENT      EXPECTED   Desmethylvenlafaxine           PRESENT      EXPECTED    Desmethylvenlafaxine is an expected metabolite of venlafaxine.    Acetaminophen                  PRESENT      EXPECTED   Salicylate                     PRESENT      EXPECTED   Metoprolol                     PRESENT      EXPECTED  Drug Present not Declared for Prescription Verification   Lidocaine                      PRESENT      UNEXPECTED  Drug Absent but Declared for Prescription Verification   Cyclobenzaprine                Not Detected UNEXPECTED ==================================================================== Test                      Result    Flag   Units      Ref Range   Creatinine              259              mg/dL      >=20 ==================================================================== Declared Medications:  The flagging and interpretation on this report are based on the  following declared medications.  Unexpected results may arise from  inaccuracies in the declared medications.   **Note: The testing scope of this panel includes these medications:   Cyclobenzaprine (Flexeril)  Gabapentin  Hydrocodone  Metoprolol  Venlafaxine   **Note: The testing scope of this panel does not include small to  moderate amounts of these  reported medications:   Acetaminophen  Aspirin   **Note: The testing scope of this panel does not include the  following reported medications:   Albuterol  Apixaban (Eliquis)  Atorvastatin  Caffeine  Docusate  Donepezil (Aricept)  Levothyroxine  Losartan  Vitamin B12  Vitamin D2 ==================================================================== For clinical consultation, please call [phone]. ====================================================================      ROS  Constitutional: Denies any fever or chills Gastrointestinal: No reported hemesis, hematochezia, vomiting, or acute GI distress Musculoskeletal: Denies any acute onset joint swelling, redness, loss of ROM, or weakness Neurological: No reported episodes of acute onset apraxia, aphasia, dysarthria, agnosia, amnesia, paralysis, loss of coordination, or loss of consciousness  Medication Review  Vitamin B-12, albuterol, apixaban, atorvastatin, cyclobenzaprine, docusate sodium, donepezil, ergocalciferol, gabapentin, levothyroxine, losartan, metoprolol succinate, oxyCODONE, and venlafaxine XR  History Review  Allergy: Ms. Nading is allergic to heparin. Drug: Ms. Lacks  reports no history of drug use. Alcohol:  reports no history of alcohol use. Tobacco:  reports that she has been smoking cigarettes. She has a 13.25 pack-year smoking history. She has never used smokeless tobacco. Social: Ms. Toenjes  reports that she has been smoking cigarettes. She has a 13.25 pack-year smoking history. She has never used smokeless tobacco. She reports that she does not drink alcohol and does not use drugs. Medical:  has  a past medical history of Anxiety, Arthritis, Asthma, Chronic lower back pain, Complication of anesthesia, COPD (chronic obstructive pulmonary disease) ([place]), DDD (degenerative disc disease), lumbar, Depression, Diverticulosis, GERD (gastroesophageal reflux disease), High cholesterol, Hypertension,  Hypothyroidism, Memory loss, On home oxygen therapy, Peripheral vascular disease ([place]), Pre-diabetes, Shortness of breath dyspnea, and Stroke ([place]). Surgical: Ms. Dauphin  has a past surgical history that includes Tonsillectomy (~ 1950); Excisional hemorrhoidectomy (1960's); Total knee arthroplasty (Left, 2008); Knee arthroscopy (Left, X 2 <2008); Vaginal hysterectomy (1974); Tubal ligation (1973); Cardiac catheterization (~ 2007); Shoulder open rotator cuff repair (Right, 2001); Eye surgery (Bilateral); Knee arthroscopy with subchondroplasty (Right, 04/19/2016); Total knee arthroplasty (Right, 07/03/2016); and LOOP RECORDER INSERTION (N/A, 01/02/2018). Family: family history includes Congestive Heart Failure in her father and sister; Diabetes in her mother; Hypertension in her mother; Lung cancer in her mother.  Laboratory Chemistry Profile   Renal Lab Results  Component Value Date   BUN 17 10/13/2020   CREATININE 0.68 10/13/2020   BCR 16 05/07/2019   GFRAA >60 11/06/2019   GFRNONAA >60 10/13/2020     Hepatic Lab Results  Component Value Date   AST 21 10/12/2020   ALT 16 10/12/2020   ALBUMIN 3.8 10/12/2020   ALKPHOS 58 10/12/2020   HCVAB NEGATIVE 08/24/2014   LIPASE 18 08/20/2014     Electrolytes Lab Results  Component Value Date   NA 139 10/13/2020   K 3.7 10/13/2020   CL 106 10/13/2020   CALCIUM 8.9 10/13/2020   MG 2.1 09/07/2020     Bone Lab Results  Component Value Date   VD25OH 8.6 (L) 05/07/2019   25OHVITD1 59 09/07/2020   25OHVITD2 57 09/07/2020   25OHVITD3 1.9 09/07/2020   TESTOSTERONE 16 04/29/2018     Inflammation (CRP: Acute Phase) (ESR: Chronic Phase) Lab Results  Component Value Date   CRP 3 01/04/2020   ESRSEDRATE 17 01/04/2020   LATICACIDVEN 1.7 04/23/2016       Note: Above Lab results reviewed.  Recent Imaging Review  CUP PACEART REMOTE DEVICE CHECK ILR summary report received. Battery status OK. Normal device function. No new symptom, brady, or  pause episodes. No new AF episodes. 1 tachy episode stored, no details to review with block of non-monitored tome noted. Patient has had message left to  send manual for further review. Monthly summary reports and ROV/PRN Note: Reviewed        Physical Exam  General appearance: Well nourished, well developed, and well hydrated. In no apparent acute distress Mental status: Alert, oriented x 3 (person, place, & time)       Respiratory: No evidence of acute respiratory distress Eyes: PERLA Vitals: BP (!) 162/56   Pulse 71   Temp (!) 97 F (36.1 C)   Resp 16   Ht _0  (1.626 m)   Wt 213 lb (96.6 kg)   SpO2 97%   BMI 36.56 kg/m  BMI: Estimated body mass index is 36.56 kg/m as calculated from the following:   Height as of this encounter: _1  (1.626 m).   Weight as of this encounter: 213 lb (96.6 kg). Ideal: Ideal body weight: 54.7 kg (120 lb 9.5 oz) Adjusted ideal body weight: 71.5 kg (157 lb 8.9 oz)  Assessment   Status Diagnosis  Controlled Controlled Controlled 1. Lumbar central spinal stenosis (Moderate: L2-3  Severe: L3-4, L4-5) w/ neurogenic claudication   2. Lumbar foraminal stenosis (Bilateral: L2-3, L3-4, L4-5) (Right: L5-S1) (Severe left L4-5)   3. Lumbar lateral recess stenosis (Right:  L2-3)   4. Lumbosacral radiculopathy at L2 (Right)   5. Lumbar radiculitis (L2 dermatome) (Right)   6. DDD (degenerative disc disease), thoracolumbar   7. Compression fracture of L1 lumbar vertebra, sequela   8. Chronic thoracic back pain (Left)   9. T12 compression fracture, sequela   10. Abnormal MRI, lumbar spine (04/20/2020)   11. Chronic pain syndrome   12. Chronic anticoagulation (Eliquis)      Updated Problems: Problem  Ddd (Degenerative Disc Disease), Thoracolumbar    Plan of Care  Problem-specific:  No problem-specific Assessment & Plan notes found for this encounter.  Ms. NHUNG DANKO has a current medication list which includes the following long-term  medication(s): albuterol, apixaban, atorvastatin, vitamin b-12, donepezil, levothyroxine, losartan, metoprolol succinate, and venlafaxine xr.  Pharmacotherapy (Medications Ordered): No orders of the defined types were placed in this encounter.  Orders:  Orders Placed This Encounter  Procedures  . PUMP TRIAL    Standing Status:   Future    Standing Expiration Date:   02/21/2021    Scheduling Instructions:     Same day, outpatient trial. Schedule the patient to be the first case of the morning. Estimated for patient to be here for 4 to 6 hours. Have nurse order necessary medications. Have a spinal tray available.    Order Specific Question:   Where will this procedure be performed?    Answer:   [facility]  . Blood Thinner Instructions to Nursing    Always make sure patient has clearance from prescribing physician to stop blood thinners for interventional therapies. If the patient requires a Lovenox-bridge therapy, make sure arrangements are made to institute it with the assistance of the PCP.    Scheduling Instructions:     Have Ms. Andre stop the Eliquis (Apixaban) x 3 days prior to procedure or surgery.   Follow-up plan:   Return for Procedure (no sedation): IT Pump trial, (Blood Thinner Protocol).      Interventional Therapies  Risk  Complexity Considerations:   ELIQUIS Anticoagulation (Stop: 3 days  Restart: 6 hrs) PMR: Prior patient of [facility] (Dr. Alba Destine) Neurosurgeon: Deetta Perla, MD (right L 2-3 hemilaminectomy and L3-5 laminectomy surgery canceled on 07/25/2020) Neurologist: Gurney Maxin, MD   Planned  Pending:   Trial of intrathecal opioids to determine if the patient may be a good candidate for an intrathecal pump implant.   Under consideration:   Intrathecal pump implant   Completed:   Diagnostic/therapeutic right L1-2 LESI #1 (11/10/2020) Diagnostic/therapeutic right L1 TFESI #1 (09/27/2020) (100/100/100/90-100 x 10 days)   Diagnostic/therapeutic right L2 TFESI #1 (09/27/2020) (100/100/100/90-100 x 10 days)  Diagnostic/therapeutic left L4-5 LESI #1 (09/27/2020) (100/100/100/90-100 x 10 days)    Completed at the [facility] by Girtha Hake, MD (PMR): Right L5 TFESI #1 (05/05/2020) - 7 days of pain relief. Bilateral L5 TFESI #2 (06/09/2020) - 24 to 48 hours of pain relief.   Therapeutic  Palliative (PRN) options:   None established    Recent Visits Date Type Provider Dept  11/10/20 Procedure visit Milinda Pointer, MD [facility]  10/10/20 Office Visit Milinda Pointer, MD [facility]  09/27/20 Procedure visit Milinda Pointer, MD [facility]  09/19/20 Office Visit Milinda Pointer, MD [facility]  09/07/20 Office Visit Milinda Pointer, MD [facility]  Showing recent visits within past 90 days and meeting all other requirements Today's Visits Date Type Provider Dept  11/22/20 Office Visit Milinda Pointer, MD [facility]  Showing today's visits and meeting all other requirements Future Appointments No visits were found meeting these conditions. Showing future appointments within next 90 days and meeting all other requirements  I discussed the assessment and treatment plan with the patient. The patient was provided an opportunity to ask questions and all were answered. The patient agreed with the plan and demonstrated an understanding of the instructions.  Patient advised to call back or seek an in-person evaluation if the symptoms or condition worsens.  Duration of encounter: 60 minutes.  Note by: Gaspar Cola, MD Date: 11/22/2020; Time: 4:10 PM

## 2020-11-28 ENCOUNTER — Telehealth: Payer: Self-pay | Admitting: Pain Medicine

## 2020-11-28 NOTE — Telephone Encounter (Signed)
LM for patient daughter to call us back .  Spoke with DR Dossie Arbour and he states that he had a lengthy conversation with the patient and her family and that other than the pump trial, there wasn't anything else he could do at this point.  The pump trial is scheduled for the end of April.

## 2020-11-28 NOTE — Telephone Encounter (Signed)
Larene Beach, (patient's daughter) called stating her mom called her on Saturday morning in tears because she is hurting so bad. She has appt 12-13-20 for pump trial. They want to know if there is anything that can be done to help until then ? Please call Larene Beach

## 2020-11-29 ENCOUNTER — Other Ambulatory Visit: Payer: Self-pay | Admitting: Hospice and Palliative Medicine

## 2020-11-29 ENCOUNTER — Ambulatory Visit: Payer: Medicare Other | Admitting: Pain Medicine

## 2020-11-29 ENCOUNTER — Other Ambulatory Visit: Payer: Self-pay | Admitting: Nurse Practitioner

## 2020-11-29 ENCOUNTER — Telehealth: Payer: Self-pay | Admitting: *Deleted

## 2020-11-29 DIAGNOSIS — M62838 Other muscle spasm: Secondary | ICD-10-CM

## 2020-11-29 DIAGNOSIS — M25512 Pain in left shoulder: Secondary | ICD-10-CM

## 2020-11-29 NOTE — Telephone Encounter (Signed)
Left message with office number to call back regarding stopping eliquis x 3 days for procedure.

## 2020-11-29 NOTE — Telephone Encounter (Signed)
Please refuse this medication, pt sees pain management

## 2020-11-29 NOTE — Progress Notes (Signed)
Carelink Summary Report / Loop Recorder 

## 2020-11-30 ENCOUNTER — Other Ambulatory Visit: Payer: Self-pay | Admitting: Hospice and Palliative Medicine

## 2020-11-30 ENCOUNTER — Telehealth: Payer: Self-pay

## 2020-11-30 ENCOUNTER — Other Ambulatory Visit: Payer: Self-pay | Admitting: Pain Medicine

## 2020-11-30 DIAGNOSIS — M25512 Pain in left shoulder: Secondary | ICD-10-CM

## 2020-11-30 NOTE — Progress Notes (Signed)
We continue to get daily phone calls from the family requesting something else to be done.  Did not seem to understand that mortality rates associated with stroke following non-cardiac surgery are reported in the range of 18-32% and are even higher in those with a previous history of stroke. Compared with patients who had never experienced a stroke, a prior history of stroke, especially within the preceding 3 months of surgery, is associated with a higher risk of major cardiovascular events (odds ratio 14.23, 95% CI 11.61-17.45) and also a higher 30-day mortality rate.  As to what is it that we have planned for this patient, please see my note on 11/22/2020 where this was explained in detail to the family.  Nothing has changed since.

## 2020-11-30 NOTE — Telephone Encounter (Signed)
Pt daughter called her mother need med to help her relax and she having difficulty sleeping as per dr make her telephone visit tomorrow at 2

## 2020-11-30 NOTE — Telephone Encounter (Signed)
The patients daughter called back and wants to know why this has not been addressed. She called Monday to see what could be done for her mother until procedure on the 4/26. She is in a lot of pain.

## 2020-11-30 NOTE — Telephone Encounter (Signed)
I called the daughterLarene Beach- and had a discussion with her about the return phone calls which were made and voice mails left. She states we were calling the wrong daughter and that she had not been returning calls or listening to VM due to a medical issue. Daughter states she spoke with someone who said they would change the contact info.  Per Dr. Dossie Arbour , he will not be prescribing any pain medications. He states he can offer her to go back to the neurosurgeon or wait until the pump trial. Patient's daughter called and informed.

## 2020-12-01 ENCOUNTER — Other Ambulatory Visit: Payer: Self-pay | Admitting: Internal Medicine

## 2020-12-01 ENCOUNTER — Ambulatory Visit (INDEPENDENT_AMBULATORY_CARE_PROVIDER_SITE_OTHER): Payer: Medicare Other | Admitting: Internal Medicine

## 2020-12-01 ENCOUNTER — Other Ambulatory Visit: Payer: Self-pay

## 2020-12-01 ENCOUNTER — Encounter: Payer: Self-pay | Admitting: Internal Medicine

## 2020-12-01 VITALS — Ht 64.0 in | Wt 213.0 lb

## 2020-12-01 DIAGNOSIS — G894 Chronic pain syndrome: Secondary | ICD-10-CM | POA: Diagnosis not present

## 2020-12-01 DIAGNOSIS — M62838 Other muscle spasm: Secondary | ICD-10-CM

## 2020-12-01 DIAGNOSIS — M48062 Spinal stenosis, lumbar region with neurogenic claudication: Secondary | ICD-10-CM

## 2020-12-01 DIAGNOSIS — G479 Sleep disorder, unspecified: Secondary | ICD-10-CM | POA: Diagnosis not present

## 2020-12-01 MED ORDER — LEVOTHYROXINE SODIUM 75 MCG PO TABS: 75.0000 ug | ORAL_TABLET | Freq: Every day | ORAL | 1 refills | Status: DC

## 2020-12-01 MED ORDER — OXYCODONE HCL 5 MG PO CAPS: ORAL_CAPSULE | ORAL | 0 refills | Status: DC

## 2020-12-01 NOTE — Progress Notes (Signed)
[facility] [place], [place] [address]  Internal MEDICINE  Office Visit Note  Patient Name: [name] Douglas  [id number]  [id number]  Date of Service: 12/08/2020  Chief Complaint  Patient presents with  . Telephone Assessment  . Telephone Screen  . Back Pain    Unable to sleep due to pain   . Leg Pain    Right     HPI Pt is seen for acute visit 1. Unbearable back pain, chronic pain syndrome, followed by pain management as well. She has h/o severe lumbar spinal stenosis with neurogenic claudication, cannot get out of the house so this is a virtual visit  Pt also suffered a TIA in 2/22, no residual deficits.  Unable to assess other problems   Current Medication: Outpatient Encounter Medications as of 12/01/2020  Medication Sig Note  . albuterol (VENTOLIN HFA) 108 (90 Base) MCG/ACT inhaler Inhale 2 puffs into the lungs every 6 (six) hours as needed for wheezing or shortness of breath.   Marland Kitchen apixaban (ELIQUIS) 5 MG TABS tablet Take 1 tablet (5 mg total) by mouth 2 (two) times daily. (Patient taking differently: Take 5 mg by mouth daily.) 10/12/2020: On hold until 2/24  . atorvastatin (LIPITOR) 80 MG tablet Take 1 tablet (80 mg total) by mouth daily at 6 PM.   . Cyanocobalamin (VITAMIN B-12) 5000 MCG SUBL Place 1 tablet (5,000 mcg total) under the tongue daily.   Marland Kitchen docusate sodium (COLACE) 100 MG capsule Take 100 mg by mouth daily as needed for mild constipation.    Marland Kitchen donepezil (ARICEPT) 5 MG tablet Take 1 tablet (5 mg total) by mouth at bedtime.   . ergocalciferol (DRISDOL) 1.25 MG (50000 UT) capsule Take 1 capsule (50,000 Units total) by mouth every Friday.   . gabapentin (NEURONTIN) 100 MG capsule Take 200 mg by mouth 3 (three) times daily.   Marland Kitchen losartan (COZAAR) 25 MG tablet TAKE 1 TABLET BY MOUTH DAILY   . metoprolol succinate (TOPROL-XL) 50 MG 24 hr tablet Take 2 tablets (100 mg total) by mouth daily.   Marland Kitchen oxycodone (OXY-IR) 5 MG capsule Take one tab po qhs  for severe and chronic pain   . venlafaxine XR (EFFEXOR-XR) 75 MG 24 hr capsule TAKE 2 CAPSULES BY MOUTH DAILY.   . [DISCONTINUED] cyclobenzaprine (FLEXERIL) 5 MG tablet Take 1 tablet (5 mg total) by mouth at bedtime.   . [DISCONTINUED] levothyroxine (SYNTHROID) 75 MCG tablet Take 1 tablet (75 mcg total) by mouth daily before breakfast.   . [DISCONTINUED] oxyCODONE (ROXICODONE) 5 MG immediate release tablet Take 1 tablet (5 mg total) by mouth every 4 (four) hours as needed for severe pain.    No facility-administered encounter medications on file as of 12/01/2020.    Surgical History: Past Surgical History:  Procedure Laterality Date  . CARDIAC CATHETERIZATION  ~ 2007  . EXCISIONAL HEMORRHOIDECTOMY  1960's  . EYE SURGERY Bilateral    Cataract extraction with IOL  . KNEE ARTHROSCOPY Left X 2 <2008  . KNEE ARTHROSCOPY WITH SUBCHONDROPLASTY Right 04/19/2016   Procedure: KNEE ARTHROSCOPY WITH SUBCHONDROPLASTY, PARTIAL MENISCECTOMY;  Surgeon: Hessie Knows, MD;  Location: [facility];  Service: Orthopedics;  Laterality: Right;  . LOOP RECORDER INSERTION N/A 01/02/2018   Procedure: LOOP RECORDER INSERTION;  Surgeon: Deboraha Sprang, MD;  Location: [facility];  Service: Cardiovascular;  Laterality: N/A;  . SHOULDER OPEN ROTATOR CUFF REPAIR Right 2001  . TONSILLECTOMY  ~ 1950  . TOTAL KNEE ARTHROPLASTY  Left 2008  . TOTAL KNEE ARTHROPLASTY Right 07/03/2016   Procedure: TOTAL KNEE ARTHROPLASTY;  Surgeon: Hessie Knows, MD;  Location: [facility];  Service: Orthopedics;  Laterality: Right;  . TUBAL LIGATION  1973  . VAGINAL HYSTERECTOMY  1974    Medical History: Past Medical History:  Diagnosis Date  . Anxiety   . Arthritis    "shoulders" (08/20/2014)  . Asthma   . Chronic lower back pain   . Complication of anesthesia    "agitated & restless after knee replacement in 2008"  . COPD (chronic obstructive pulmonary disease) ([place])   . DDD (degenerative disc disease), lumbar   . Depression    . Diverticulosis   . GERD (gastroesophageal reflux disease)   . High cholesterol   . Hypertension   . Hypothyroidism   . Memory loss    mild  . On home oxygen therapy    "2L at night" (08/20/2014)  . Peripheral vascular disease ([place])   . Pre-diabetes   . Shortness of breath dyspnea    with exertion  . Stroke ([place])    mild, no deficits    Family History: Family History  Problem Relation Age of Onset  . Diabetes Mother   . Hypertension Mother   . Lung cancer Mother   . Congestive Heart Failure Father   . Congestive Heart Failure Sister     Social History   Socioeconomic History  . Marital status: Divorced    Spouse name: Not on file  . Number of children: Not on file  . Years of education: Not on file  . Highest education level: Not on file  Occupational History  . Not on file  Tobacco Use  . Smoking status: Current Some Day Smoker    Packs/day: 0.25    Years: 53.00    Pack years: 13.25    Types: Cigarettes  . Smokeless tobacco: Never Used  . Tobacco comment: 3 cigarettes in week  Vaping Use  . Vaping Use: Never used  Substance and Sexual Activity  . Alcohol use: No  . Drug use: No  . Sexual activity: Never  Other Topics Concern  . Not on file  Social History Narrative  . Not on file   Social Determinants of Health   Financial Resource Strain: Not on file  Food Insecurity: Not on file  Transportation Needs: Not on file  Physical Activity: Not on file  Stress: Not on file  Social Connections: Not on file  Intimate Partner Violence: Not on file      Review of Systems  Constitutional: Negative for fatigue and fever.  HENT: Negative for congestion, mouth sores and postnasal drip.   Respiratory: Negative for cough.   Cardiovascular: Negative for chest pain.  Genitourinary: Negative for flank pain.  Musculoskeletal: Positive for arthralgias, back pain and gait problem.  Psychiatric/Behavioral: Positive for sleep disturbance.    Vital Signs: Ht  5\' 4"  (1.626 m)   Wt 213 lb (96.6 kg)   BMI 36.56 kg/m    Physical Exam Pt is able to communicate well and addressed her pain control. She did call her MD at pain center     Assessment/Plan: 1. Lumbar central spinal stenosis (Moderate: L2-3  Severe: L3-4, L4-5) w/ neurogenic claudication Chronic problem, followed by pain management.  2. Sleep disturbance Will order overnight pulse oximetry to make sure pt does not have hypoxia due to narcotics  - Pulse oximetry, overnight; Future  3. Chronic pain syndrome Limited supply of oxycodone and was  advised to communicate with Dr Lowella Dandy  - oxycodone (OXY-IR) 5 MG capsule; Take one tab po qhs for severe and chronic pain  Dispense: 30 capsule; Refill: 0  General Counseling: [name] Douglas verbalizes understanding of the findings of todays visit and agrees with plan of treatment. I have discussed any further diagnostic evaluation that may be needed or ordered today. We also reviewed her medications today. she has been encouraged to call the office with any questions or concerns that should arise related to todays visit.    Orders Placed This Encounter  Procedures  . Pulse oximetry, overnight    Meds ordered this encounter  Medications  . oxycodone (OXY-IR) 5 MG capsule    Sig: Take one tab po qhs for severe and chronic pain    Dispense:  30 capsule    Refill:  0    Total time spent:20 Minutes Time spent includes review of chart, medications, test results, and follow up plan with the patient.    [place] Controlled Substance Database was reviewed by me.   Dr Lavera Guise Internal medicine

## 2020-12-02 ENCOUNTER — Other Ambulatory Visit: Payer: Self-pay | Admitting: Internal Medicine

## 2020-12-02 DIAGNOSIS — M62838 Other muscle spasm: Secondary | ICD-10-CM

## 2020-12-07 NOTE — Telephone Encounter (Signed)
Can you sign off on this rx so it will go off my request please

## 2020-12-09 ENCOUNTER — Ambulatory Visit: Payer: Medicare Other | Admitting: Hospice and Palliative Medicine

## 2020-12-13 ENCOUNTER — Other Ambulatory Visit: Payer: Self-pay

## 2020-12-13 ENCOUNTER — Encounter: Payer: Self-pay | Admitting: Pain Medicine

## 2020-12-13 ENCOUNTER — Ambulatory Visit (HOSPITAL_BASED_OUTPATIENT_CLINIC_OR_DEPARTMENT_OTHER): Payer: Medicare Other | Admitting: Pain Medicine

## 2020-12-13 ENCOUNTER — Ambulatory Visit
Admission: RE | Admit: 2020-12-13 | Discharge: 2020-12-13 | Disposition: A | Discharge status: Home / Self Care | Payer: Medicare Other | Source: Ambulatory Visit | Attending: Pain Medicine | Admitting: Pain Medicine

## 2020-12-13 ENCOUNTER — Telehealth: Payer: Self-pay | Admitting: Pain Medicine

## 2020-12-13 VITALS — BP 202/79 | HR 75 | Temp 97.0°F | Resp 22 | Ht 64.0 in | Wt 213.0 lb

## 2020-12-13 DIAGNOSIS — S32010S Wedge compression fracture of first lumbar vertebra, sequela: Secondary | ICD-10-CM | POA: Diagnosis not present

## 2020-12-13 DIAGNOSIS — M546 Pain in thoracic spine: Secondary | ICD-10-CM | POA: Insufficient documentation

## 2020-12-13 DIAGNOSIS — M48061 Spinal stenosis, lumbar region without neurogenic claudication: Secondary | ICD-10-CM

## 2020-12-13 DIAGNOSIS — M5416 Radiculopathy, lumbar region: Secondary | ICD-10-CM | POA: Diagnosis not present

## 2020-12-13 DIAGNOSIS — M79604 Pain in right leg: Secondary | ICD-10-CM

## 2020-12-13 DIAGNOSIS — M4316 Spondylolisthesis, lumbar region: Secondary | ICD-10-CM | POA: Insufficient documentation

## 2020-12-13 DIAGNOSIS — M5417 Radiculopathy, lumbosacral region: Secondary | ICD-10-CM | POA: Insufficient documentation

## 2020-12-13 DIAGNOSIS — M5441 Lumbago with sciatica, right side: Secondary | ICD-10-CM | POA: Diagnosis not present

## 2020-12-13 DIAGNOSIS — M5137 Other intervertebral disc degeneration, lumbosacral region: Secondary | ICD-10-CM

## 2020-12-13 DIAGNOSIS — G8929 Other chronic pain: Secondary | ICD-10-CM | POA: Diagnosis not present

## 2020-12-13 DIAGNOSIS — S22080S Wedge compression fracture of T11-T12 vertebra, sequela: Secondary | ICD-10-CM | POA: Insufficient documentation

## 2020-12-13 DIAGNOSIS — S32050S Wedge compression fracture of fifth lumbar vertebra, sequela: Secondary | ICD-10-CM | POA: Diagnosis not present

## 2020-12-13 DIAGNOSIS — R937 Abnormal findings on diagnostic imaging of other parts of musculoskeletal system: Secondary | ICD-10-CM | POA: Diagnosis not present

## 2020-12-13 DIAGNOSIS — M48062 Spinal stenosis, lumbar region with neurogenic claudication: Secondary | ICD-10-CM | POA: Diagnosis not present

## 2020-12-13 DIAGNOSIS — M4310 Spondylolisthesis, site unspecified: Secondary | ICD-10-CM | POA: Insufficient documentation

## 2020-12-13 DIAGNOSIS — M51379 Other intervertebral disc degeneration, lumbosacral region without mention of lumbar back pain or lower extremity pain: Secondary | ICD-10-CM

## 2020-12-13 DIAGNOSIS — G894 Chronic pain syndrome: Secondary | ICD-10-CM | POA: Insufficient documentation

## 2020-12-13 DIAGNOSIS — M5135 Other intervertebral disc degeneration, thoracolumbar region: Secondary | ICD-10-CM | POA: Insufficient documentation

## 2020-12-13 MED ORDER — FENTANYL CITRATE (PF) 100 MCG/2ML IJ SOLN
INTRAMUSCULAR | Status: AC
  Filled 2020-12-13: qty 2

## 2020-12-13 MED ORDER — ROPIVACAINE HCL 2 MG/ML IJ SOLN
INTRAMUSCULAR | Status: AC
  Filled 2020-12-13: qty 20

## 2020-12-13 MED ORDER — LIDOCAINE HCL 2 % IJ SOLN
20.0000 mL | Freq: Once | INTRAMUSCULAR | Status: AC
  Administered 2020-12-13: 400 mg

## 2020-12-13 MED ORDER — LIDOCAINE HCL 2 % IJ SOLN
INTRAMUSCULAR | Status: AC
  Filled 2020-12-13: qty 20

## 2020-12-13 MED ORDER — FENTANYL CITRATE (PF) 100 MCG/2ML IJ SOLN: 25.0000 ug | INTRAMUSCULAR | Status: DC | PRN

## 2020-12-13 MED ORDER — ROPIVACAINE HCL 2 MG/ML IJ SOLN
20.0000 mL | Freq: Once | INTRAMUSCULAR | Status: AC
  Administered 2020-12-13: 20 mL

## 2020-12-13 NOTE — Progress Notes (Signed)
Safety precautions to be maintained throughout the outpatient stay will include: orient to surroundings, keep bed in low position, maintain call bell within reach at all times, provide assistance with transfer out of bed and ambulation.   [phone] received to recovery room to watch patient for any complications. Up In wheelchair and water given.  0910 no itching noted per patient. Patient up to bathroom. Voided large amount and moving well. 0/10. Denies needs

## 2020-12-13 NOTE — Patient Instructions (Signed)

## 2020-12-13 NOTE — Progress Notes (Signed)
PROVIDER NOTE: Information contained herein reflects review and annotations entered in association with encounter. Interpretation of such information and data should be left to medically-trained personnel. Information provided to patient can be located elsewhere in the medical record under "Patient Instructions". Document created using STT-dictation technology, any transcriptional errors that may result from process are unintentional.    Patient: [name] Douglas  Service Category: Procedure  Provider: Gaspar Cola, MD  DOB: 1941/02/04  DOS: 12/13/2020  Location: [facility]  MRN: [id number]  Setting: Ambulatory - outpatient  Referring Provider: Milinda Pointer, MD  Type: Established Patient  Specialty: Interventional Pain Management  PCP: Lavera Guise, MD   Primary Reason for Visit: Interventional Pain Management Treatment. CC: Back Pain  Procedure:          Anesthesia, Analgesia, Anxiolysis:  Type: Diagnostic Intrathecal Injection of diagnostic substance Purpose: Intrathecal Pump Trial  Region: Lumbar Level: T12-L1 Level. Laterality: Midline         Type: Local Anesthesia Indication(s): Analgesia         Route: Infiltration (Porter/IM) IV Access: Declined Sedation: Declined  Local Anesthetic: Lidocaine 1-2%  Position: Prone with head of the table was raised to facilitate breathing.   Indications: 1. Chronic lower extremity pain (1ry area of Pain) (Right)   2. Chronic low back pain (2ry area of Pain) (Bilateral) (R>L) w/ sciatica (Right)   3. Chronic lumbar radiculopathy (Right)   4. Compression fracture of L1 lumbar vertebra, sequela   5. Compression fracture of L5 vertebra, sequela   6. DDD (degenerative disc disease), lumbosacral   7. DDD (degenerative disc disease), thoracolumbar   8. Grade 1 Lumbar Anterolisthesis (L3/4 and L4/5)   9. Grade 1 Lumbar Retrolisthesis (L2/3)    Pain Score: Pre-procedure: 10-Worst pain ever/10 Post-procedure: 0-No pain/10    NOTE: on the 15-minute follow-up after the procedure, the patient indicated being extremely happy.  She was experiencing absolutely no pain (100% relief of the pain), she was able to get out of the wheelchair and walk, her range of motion improved significantly, her ability to walk also returned, she did not develop any neurological problems, she did not experience any pruritus or urinary retention, and she was able to void without any problems prior to going home.  If these results hold, we will be referring the patient to neurosurgery for the intrathecal pump implant.  I have the patient's schedule for follow-up in 1 week.  Pre-op H&P Assessment:  [name] is a 80 y.o. (year old), female patient, seen today for interventional treatment. She  has a past surgical history that includes Tonsillectomy (~ 1950); Excisional hemorrhoidectomy (1960's); Total knee arthroplasty (Left, 2008); Knee arthroscopy (Left, X 2 <2008); Vaginal hysterectomy (1974); Tubal ligation (1973); Cardiac catheterization (~ 2007); Shoulder open rotator cuff repair (Right, 2001); Eye surgery (Bilateral); Knee arthroscopy with subchondroplasty (Right, 04/19/2016); Total knee arthroplasty (Right, 07/03/2016); and LOOP RECORDER INSERTION (N/A, 01/02/2018). [name] has a current medication list which includes the following prescription(s): albuterol, apixaban, atorvastatin, cyclobenzaprine, docusate sodium, donepezil, ergocalciferol, gabapentin, levothyroxine, losartan, metoprolol succinate, oxycodone, venlafaxine xr, and vitamin b-12, and the following Facility-Administered Medications: fentanyl. Her primarily concern today is the Back Pain  Initial Vital Signs:  Pulse/HCG Rate: 75ECG Heart Rate: 78 Temp: (!) 97.2 F (36.2 C) Resp: 16 BP: (!) 154/50 SpO2: 98 %  BMI: Estimated body mass index is 36.56 kg/m as calculated from the following:   Height as of this encounter: 5\' 4"  (1.626 m).   Weight  as of this encounter: 213 lb  (96.6 kg).  Risk Assessment: Allergies: Reviewed. She is allergic to heparin.  Allergy Precautions: None required Coagulopathies: Reviewed. None identified.  Blood-thinner therapy: None at this time Active Infection(s): Reviewed. None identified. [name] is afebrile  Site Confirmation: [name] was asked to confirm the procedure and laterality before marking the site Procedure checklist: Completed Consent: Before the procedure and under the influence of no sedative(s), amnesic(s), or anxiolytics, the patient was informed of the treatment options, risks and possible complications. To fulfill our ethical and legal obligations, as recommended by the American Medical Association's Code of Ethics, I have informed the patient of my clinical impression; the nature and purpose of the treatment or procedure; the risks, benefits, and possible complications of the intervention; the alternatives, including doing nothing; the risk(s) and benefit(s) of the alternative treatment(s) or procedure(s); and the risk(s) and benefit(s) of doing nothing. The patient was provided information about the general risks and possible complications associated with the procedure. These may include, but are not limited to: failure to achieve desired goals, infection, bleeding, organ or nerve damage, allergic reactions, paralysis, and death. In addition, the patient was informed of those risks and complications associated to Spine-related procedures, such as failure to decrease pain; infection (i.e.: Meningitis, epidural or intraspinal abscess); bleeding (i.e.: epidural hematoma, subarachnoid hemorrhage, or any other type of intraspinal or peri-dural bleeding); organ or nerve damage (i.e.: Any type of peripheral nerve, nerve root, or spinal cord injury) with subsequent damage to sensory, motor, and/or autonomic systems, resulting in permanent pain, numbness, and/or weakness of one or several areas of the body; allergic reactions;  (i.e.: anaphylactic reaction); and/or death. Furthermore, the patient was informed of those risks and complications associated with the medications. These include, but are not limited to: allergic reactions (i.e.: anaphylactic or anaphylactoid reaction(s)); adrenal axis suppression; blood sugar elevation that in diabetics may result in ketoacidosis or comma; water retention that in patients with history of congestive heart failure may result in shortness of breath, pulmonary edema, and decompensation with resultant heart failure; weight gain; swelling or edema; medication-induced neural toxicity; particulate matter embolism and blood vessel occlusion with resultant organ, and/or nervous system infarction; and/or aseptic necrosis of one or more joints. Finally, the patient was informed that Medicine is not an exact science; therefore, there is also the possibility of unforeseen or unpredictable risks and/or possible complications that may result in a catastrophic outcome. The patient indicated having understood very clearly. We have given the patient no guarantees and we have made no promises. Enough time was given to the patient to ask questions, all of which were answered to the patient's satisfaction. Ms. Terhorst has indicated that she wanted to continue with the procedure. Attestation: I, the ordering provider, attest that I have discussed with the patient the benefits, risks, side-effects, alternatives, likelihood of achieving goals, and potential problems during recovery for the procedure that I have provided informed consent. Date  Time: 12/13/2020  8:02 AM  Pre-Procedure Preparation:  Monitoring: As per clinic protocol. Respiration, ETCO2, SpO2, BP, heart rate and rhythm monitor placed and checked for adequate function Safety Precautions: Patient was assessed for positional comfort and pressure points before starting the procedure. Time-out: I initiated and conducted the "Time-out" before starting  the procedure, as per protocol. The patient was asked to participate by confirming the accuracy of the "Time Out" information. Verification of the correct person, site, and procedure were performed and confirmed by me, the nursing staff, and  the patient. "Time-out" conducted as per Joint Commission's Universal Protocol (UP.01.01.01). Time: 0938  Description of Procedure:          Target Area: Intrathecal canal via interlaminar space, initially targeting the lower laminar border of the superior vertebral body. Approach: Paramedial approach. Area Prepped: Entire Posterior Lumbar Region DuraPrep (Iodine Povacrylex [0.7% available iodine] and Isopropyl Alcohol, 74% w/w) Safety Precautions: Aspiration looking for blood return was conducted prior to all injections. At no point did we inject any substances, as a needle was being advanced. No attempts were made at seeking any paresthesias. Safe injection practices and needle disposal techniques used. Medications properly checked for expiration dates. SDV (single dose vial) medications used. Description of the Procedure: Protocol guidelines were followed.  After initially numbing the skin and subcutaneous tissue with local anesthetic infiltration, an 18-gauge, 1.5 inch introducer needle was used for the purpose of bypassing the skin using a double needle technique.  The pencil point intrathecal needle was introduced through the 18-gauge needle and advanced to the target area. Bone was contacted and the needle walked caudad, until the lamina was cleared. The ligamentum flavum was crossed and the needle slowly advanced while gently aspirating using a 3 cc syringe, until CSF was obtained. Once I had confirmed intrathecal placement, then I proceeded to inject PF-Fentanyl, 25 mcg.  Careful attention was paid to t avoid triggering any sharp pains or paresthesias while injecting.  The patient tolerated the procedure well and and was transferred to the recovery area for  evaluation of subsequent discharge.  Vitals:   12/13/20 0845 12/13/20 0850 12/13/20 0853 12/13/20 0900  BP: (!) 186/82 (!) 202/79 (!) 202/79   Pulse:      Resp: 20 (!) 26 (!) 22   Temp:    (!) 97 F (36.1 C)  TempSrc:    Tympanic  SpO2: 95% 93% 95%   Weight:      Height:        Start Time: 0843 hrs. End Time: 0851 hrs.  Materials:  Needle(s) Type: Pencil Point spinal needle Gauge: 24G Length: 3.5-in Medication(s): Please see orders for medications and dosing details.  Imaging Guidance (Spinal):          Type of Imaging Technique: Fluoroscopy Guidance (Spinal) Indication(s): Assistance in needle guidance and placement for procedures requiring needle placement in or near specific anatomical locations not easily accessible without such assistance. Exposure Time: Please see nurses notes. Contrast: Before injecting any contrast, we confirmed that the patient did not have an allergy to iodine, shellfish, or radiological contrast. Once satisfactory needle placement was completed at the desired level, radiological contrast was injected. Contrast injected under live fluoroscopy. No contrast complications. See chart for type and volume of contrast used. Fluoroscopic Guidance: I was personally present during the use of fluoroscopy. "Tunnel Vision Technique" used to obtain the best possible view of the target area. Parallax error corrected before commencing the procedure. "Direction-depth-direction" technique used to introduce the needle under continuous pulsed fluoroscopy. Once target was reached, antero-posterior, oblique, and lateral fluoroscopic projection used confirm needle placement in all planes. Images permanently stored in EMR. Interpretation: I personally interpreted the imaging intraoperatively. Adequate needle placement confirmed in multiple planes. Appropriate intrathecal spread of contrast observed, pooling in the anterior canal. No evidence of afferent or efferent intravascular  uptake. Permanent images saved into the patient's record.  Antibiotic Prophylaxis:   Anti-infectives (From admission, onward)   None     Indication(s): None identified  Post-operative Assessment:  Post-procedure Vital Signs:  Pulse/HCG Rate: 7576 Temp: (!) 97 F (36.1 C) Resp: (!) 22 BP: (!) 202/79 SpO2: 95 %  EBL: None  Complications: No immediate post-treatment complications observed by team, or reported by patient.  Note: The patient tolerated the entire procedure well. A repeat set of vitals were taken after the procedure and the patient was kept under observation following institutional policy, for this type of procedure. Post-procedural neurological assessment was performed, showing return to baseline, prior to discharge. The patient was provided with post-procedure discharge instructions, including a section on how to identify potential problems. Should any problems arise concerning this procedure, the patient was given instructions to immediately contact us, at any time, without hesitation. In any case, we plan to contact the patient by telephone for a follow-up status report regarding this interventional procedure.  Comments:  No additional relevant information.  Plan of Care  Orders:  Orders Placed This Encounter  Procedures  . DG PAIN CLINIC C-ARM 1-60 MIN NO REPORT    Intraoperative interpretation by procedural physician at [place].    Standing Status:   Standing    Number of Occurrences:   1    Order Specific Question:   Reason for exam:    Answer:   Assistance in needle guidance and placement for procedures requiring needle placement in or near specific anatomical locations not easily accessible without such assistance.  . Informed Consent Details: Physician/Practitioner Attestation; Transcribe to consent form and obtain patient signature    Transcribe to consent form and obtain patient signature. Always confirm laterality of pain with Ms. Mcclenny,  before procedure.    Order Specific Question:   Physician/Practitioner attestation of informed consent for procedure/surgical case    Answer:   I, the physician/practitioner, attest that I have discussed with the patient the benefits, risks, side effects, alternatives, likelihood of achieving goals and potential problems during recovery for the procedure that I have provided informed consent.    Order Specific Question:   Procedure    Answer:   Intrathecal Pump Trial (Spinal injection of PF-Fentanyl)    Order Specific Question:   Physician/Practitioner performing the procedure    Answer:   Blayne Garlick A. 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  . Care order/instruction: Please confirm that the patient has stopped the Eliquis (Apixaban) x 3 days prior to procedure or surgery.    Please confirm that the patient has stopped the Eliquis (Apixaban) x 3 days prior to procedure or surgery.    Standing Status:   Standing    Number of Occurrences:   1  . Intrathecal trial    Fluoroscopy needed. No sedation. Secure a "Spinal Tray" with a 3.5-in, 25G spinal needle + 1.5-in, 18G regular needle. Have PF-Fentanyl available. Patient position is "Prone".  . Bleeding precautions    Standing Status:   Standing    Number of Occurrences:   1   Chronic Opioid Analgesic:  Hydrocodone/APAP 5/325 1 tablet p.o. twice daily (10 mg/day of hydrocodone)  MME/day: 10 mg/day   Medications ordered for procedure: Meds ordered this encounter  Medications  . lidocaine (XYLOCAINE) 2 % (with pres) injection 400 mg  . ropivacaine (PF) 2 mg/mL (0.2%) (NAROPIN) injection 20 mL  . fentaNYL (SUBLIMAZE) injection 25-50 mcg    Make sure Narcan is available in the pyxis when using this medication. In the event of respiratory  depression (RR< 8/min): Titrate NARCAN (naloxone) in increments of 0.1 to 0.2 mg IV at 2-3  minute intervals, until desired degree of reversal.   Medications administered: We administered lidocaine and ropivacaine (PF) 2 mg/mL (0.2%).  See the medical record for exact dosing, route, and time of administration.  Follow-up plan:   Return in about 1 week (around 12/20/2020) for (VV), (PPE).       Interventional Therapies  Risk  Complexity Considerations:   ELIQUIS Anticoagulation (Stop: 3 days  Restart: 6 hrs) PMR: Prior patient of [facility] (Dr. Alba Destine) Neurosurgeon: Deetta Perla, MD (right L 2-3 hemilaminectomy and L3-5 laminectomy surgery canceled on 07/25/2020) Neurologist: Gurney Maxin, MD   Planned  Pending:   Trial of intrathecal opioids to determine if the patient may be a good candidate for an intrathecal pump implant.   Under consideration:   Intrathecal pump implant   Completed:   Diagnostic/therapeutic right L1-2 LESI #1 (11/10/2020) Diagnostic/therapeutic right L1 TFESI #1 (09/27/2020) (100/100/100/90-100 x 10 days)  Diagnostic/therapeutic right L2 TFESI #1 (09/27/2020) (100/100/100/90-100 x 10 days)  Diagnostic/therapeutic left L4-5 LESI #1 (09/27/2020) (100/100/100/90-100 x 10 days)    Completed at [facility] by Girtha Hake, MD (PMR): Right L5 TFESI #1 (05/05/2020) - 7 days of pain relief. Bilateral L5 TFESI #2 (06/09/2020) - 24 to 48 hours of pain relief.   Therapeutic  Palliative (PRN) options:   None established     Recent Visits Date Type Provider Dept  11/22/20 Office Visit Milinda Pointer, MD [facility]  11/10/20 Procedure visit Milinda Pointer, MD [facility]  10/10/20 Office Visit Milinda Pointer, MD [facility]  09/27/20 Procedure visit Milinda Pointer, MD [facility]  09/19/20 Office Visit Milinda Pointer, MD [facility]  Showing recent visits within past 90 days and meeting all other requirements Today's Visits Date Type Provider Dept  12/13/20 Procedure  visit Milinda Pointer, MD [facility]  Showing today's visits and meeting all other requirements Future Appointments Date Type Provider Dept  12/20/20 Appointment Milinda Pointer, MD [facility]  Showing future appointments within next 90 days and meeting all other requirements  Disposition: Discharge home  Discharge (Date  Time): 12/13/2020; 0918 hrs.   Primary Care Physician: Lavera Guise, MD Location: [facility] Note by: Gaspar Cola, MD Date: 12/13/2020; Time: 10:23 AM  Disclaimer:  Medicine is not an Chief Strategy Officer. The only guarantee in medicine is that nothing is guaranteed. It is important to note that the decision to proceed with this intervention was based on the information collected from the patient. The Data and conclusions were drawn from the patient's questionnaire, the interview, and the physical examination. Because the information was provided in large part by the patient, it cannot be guaranteed that it has not been purposely or unconsciously manipulated. Every effort has been made to obtain as much relevant data as possible for this evaluation. It is important to note that the conclusions that lead to this procedure are derived in large part from the available data. Always take into account that the treatment will also be dependent on availability of resources and existing treatment guidelines, considered by other Pain Management Practitioners as being common knowledge and practice, at the time of the intervention. For Medico-Legal purposes, it is also important to point out that variation in procedural techniques and pharmacological choices are the acceptable norm. The indications, contraindications, technique, and results of the above procedure should only be interpreted  and judged by a Board-Certified Interventional Pain Specialist with extensive familiarity and expertise in the same exact procedure and technique.

## 2020-12-14 ENCOUNTER — Telehealth: Payer: Self-pay | Admitting: *Deleted

## 2020-12-14 ENCOUNTER — Telehealth: Payer: Self-pay

## 2020-12-14 NOTE — Telephone Encounter (Signed)
Returned the daughters call about her mother and did give an explanation of what the procedure was and why we performed it and exactly what was given to her during the procedure.

## 2020-12-14 NOTE — Telephone Encounter (Signed)
12/14/2020 at [place] papers completed and patient's daughter called and informed. FMLA faxed to Lexine Baton at [phone] with daughter's permission. Copy also mailed to daughter at [address] [place] [address]. Copy also sent to scan.

## 2020-12-14 NOTE — Telephone Encounter (Signed)
Left voicemail with patient's daughter to call if there are any questions or concerns re; yesterdays procedure.

## 2020-12-14 NOTE — Telephone Encounter (Signed)
Dr. Dossie Arbour, you wanted this patient sent to Dr. Lacinda Axon for possible kyphoplasty. I sent the notes and referral. They called and spoke to [facility], and said there was nothing in the notes about kyphoplasty but they saw in the notes about a pump trial so they scheduled her for that. I had Juliann Pulse call them back and cancel that appt because you did not indicate you wanted a pump trial done on her yet. In the previous notes it is indicated that DR. Lacinda Axon stated the patient was too high risk for anesthesia. Please advise on how you want [place] to proceed.

## 2020-12-19 ENCOUNTER — Ambulatory Visit (INDEPENDENT_AMBULATORY_CARE_PROVIDER_SITE_OTHER): Payer: Medicare Other

## 2020-12-19 ENCOUNTER — Encounter: Payer: Self-pay | Admitting: Pain Medicine

## 2020-12-19 DIAGNOSIS — I639 Cerebral infarction, unspecified: Secondary | ICD-10-CM | POA: Diagnosis not present

## 2020-12-20 ENCOUNTER — Ambulatory Visit: Payer: Medicare Other | Attending: Pain Medicine | Admitting: Pain Medicine

## 2020-12-20 ENCOUNTER — Other Ambulatory Visit: Payer: Self-pay

## 2020-12-20 DIAGNOSIS — G8929 Other chronic pain: Secondary | ICD-10-CM | POA: Diagnosis not present

## 2020-12-20 DIAGNOSIS — M48061 Spinal stenosis, lumbar region without neurogenic claudication: Secondary | ICD-10-CM | POA: Insufficient documentation

## 2020-12-20 DIAGNOSIS — M546 Pain in thoracic spine: Secondary | ICD-10-CM | POA: Diagnosis not present

## 2020-12-20 DIAGNOSIS — M4316 Spondylolisthesis, lumbar region: Secondary | ICD-10-CM | POA: Diagnosis not present

## 2020-12-20 DIAGNOSIS — S32010S Wedge compression fracture of first lumbar vertebra, sequela: Secondary | ICD-10-CM | POA: Diagnosis not present

## 2020-12-20 DIAGNOSIS — M5135 Other intervertebral disc degeneration, thoracolumbar region: Secondary | ICD-10-CM | POA: Diagnosis not present

## 2020-12-20 DIAGNOSIS — M79604 Pain in right leg: Secondary | ICD-10-CM | POA: Insufficient documentation

## 2020-12-20 DIAGNOSIS — M5137 Other intervertebral disc degeneration, lumbosacral region: Secondary | ICD-10-CM | POA: Diagnosis not present

## 2020-12-20 DIAGNOSIS — M5441 Lumbago with sciatica, right side: Secondary | ICD-10-CM | POA: Diagnosis not present

## 2020-12-20 DIAGNOSIS — M4310 Spondylolisthesis, site unspecified: Secondary | ICD-10-CM | POA: Diagnosis not present

## 2020-12-20 DIAGNOSIS — S22080S Wedge compression fracture of T11-T12 vertebra, sequela: Secondary | ICD-10-CM | POA: Diagnosis not present

## 2020-12-20 DIAGNOSIS — S32050A Wedge compression fracture of fifth lumbar vertebra, initial encounter for closed fracture: Secondary | ICD-10-CM | POA: Insufficient documentation

## 2020-12-20 DIAGNOSIS — M5416 Radiculopathy, lumbar region: Secondary | ICD-10-CM | POA: Insufficient documentation

## 2020-12-20 DIAGNOSIS — S32050S Wedge compression fracture of fifth lumbar vertebra, sequela: Secondary | ICD-10-CM | POA: Insufficient documentation

## 2020-12-20 DIAGNOSIS — M48062 Spinal stenosis, lumbar region with neurogenic claudication: Secondary | ICD-10-CM | POA: Diagnosis not present

## 2020-12-20 LAB — CUP PACEART REMOTE DEVICE CHECK
Date Time Interrogation Session: 20220501001959
Implantable Pulse Generator Implant Date: 20190516

## 2020-12-20 NOTE — Progress Notes (Signed)
Patient: [name] Douglas  Service Category: E/M  Provider: Gaspar Cola, MD  DOB: 1941-07-18  DOS: 12/20/2020  Location: Office  MRN: [id number]  Setting: Ambulatory outpatient  Referring Provider: Lavera Guise, MD  Type: Established Patient  Specialty: Interventional Pain Management  PCP: Lavera Guise, MD  Location: Remote location  Delivery: TeleHealth     Virtual Encounter - Pain Management PROVIDER NOTE: Information contained herein reflects review and annotations entered in association with encounter. Interpretation of such information and data should be left to medically-trained personnel. Information provided to patient can be located elsewhere in the medical record under "Patient Instructions". Document created using STT-dictation technology, any transcriptional errors that may result from process are unintentional.    Contact & Pharmacy Preferred: [phone] Home: [phone] (home) Mobile: [phone] (mobile) E-mail: [email or web address]  MEDICAL [place], [place] - [place] [address] Phone: [phone] Fax: [phone]   Pre-screening  [name] Douglas offered "in-person" vs "virtual" encounter. She indicated preferring virtual for this encounter.   Reason COVID-19*  Social distancing based on CDC and AMA recommendations.   I contacted [name] Douglas on 12/20/2020 via telephone.      I clearly identified myself as Gaspar Cola, MD. I verified that I was speaking with the correct person using two identifiers (Name: [name] Douglas, and date of birth: [birth year]).  Consent I sought verbal advanced consent from [name] Douglas for virtual visit interactions. I informed [name] Douglas of possible security and privacy concerns, risks, and limitations associated with providing "not-in-person" medical evaluation and management services. I also informed [name] Douglas of the availability of "in-person" appointments. Finally, I  informed her that there would be a charge for the virtual visit and that she could be  personally, fully or partially, financially responsible for it. [name] Douglas expressed understanding and agreed to proceed.   Historic Elements   [name] Douglas is a 79 y.o. year old, female patient evaluated today after our last contact on 12/13/2020. [name] Douglas  has a past medical history of Anxiety, Arthritis, Asthma, Chronic lower back pain, Complication of anesthesia, COPD (chronic obstructive pulmonary disease) ([place]), DDD (degenerative disc disease), lumbar, Depression, Diverticulosis, GERD (gastroesophageal reflux disease), High cholesterol, Hypertension, Hypothyroidism, Memory loss, On home oxygen therapy, Peripheral vascular disease ([place]), Pre-diabetes, Shortness of breath dyspnea, and Stroke ([place]). She also  has a past surgical history that includes Tonsillectomy (~ 1950); Excisional hemorrhoidectomy (1960's); Total knee arthroplasty (Left, 2008); Knee arthroscopy (Left, X 2 <2008); Vaginal hysterectomy (1974); Tubal ligation (1973); Cardiac catheterization (~ 2007); Shoulder open rotator cuff repair (Right, 2001); Eye surgery (Bilateral); Knee arthroscopy with subchondroplasty (Right, 04/19/2016); Total knee arthroplasty (Right, 07/03/2016); and LOOP RECORDER INSERTION (N/A, 01/02/2018). [name] Douglas has a current medication list which includes the following prescription(s): albuterol, apixaban, atorvastatin, cyclobenzaprine, docusate sodium, donepezil, ergocalciferol, gabapentin, levothyroxine, losartan, metoprolol succinate, oxycodone, venlafaxine xr, and vitamin b-12. She  reports that she has been smoking cigarettes. She has a 13.25 pack-year smoking history. She has never used smokeless tobacco. She reports that she does not drink alcohol and does not use drugs. [name] Douglas is allergic to heparin.   HPI  Today, she is being contacted for a post-procedure assessment.  Today's encounter is to follow-up with  the patient's diagnostic injection of preservative-free fentanyl 25 mcg as a single diagnostic bolus at the T12-L1 level.  According to the patient it did provide her with 100% relief of the pain for approximately  an hour and a half.  According to studies, the average duration of the 25 mcg bolus should be approximately 80 minutes.  After the effects of the fentanyl went away, her pain returned as before.  To me was rather impressive how 20 minutes after the injection of the fentanyl bolus her pain score went from a 10/10 to a 0/10, she got out of her wheelchair, went to the bathroom to void on her own, and recovered nearly 100% of her range of motion of the lumbar spine.  She was also able to walk on her own.  Unfortunately all of these benefits wore off after about an hour and a half.  Today she was interested in knowing whether or not she would be a good candidate for the intrathecal pump and at this point, the main concern and question that I have is whether or not Dr. Lacinda Axon would consider her current medical condition to be stable enough and safe enough to undergo any type of surgery.  Today I will be reaching to him to see if we can discuss her case.  I will be sending him a copy of this note to keep him up-to-date regarding what we have done for her so far.  Post-Procedure Evaluation  Procedure (12/13/2020): Midline T12-L1 intrathecal injection of diagnostic substance (PF-fentanyl 25 mcg) #1 for the purpose of intrathecal pump trial, under fluoroscopic guidance, no sedation. Pre-procedure pain level: 10/10 Post-procedure: 0/10 (100% relief)  Sedation: None.  Effectiveness during initial hour after procedure(Ultra-Short Term Relief): 100 %.  Local anesthetic used: Long-acting (4-6 hours) Effectiveness: Defined as any analgesic benefit obtained secondary to the administration of local anesthetics. This carries significant diagnostic value as to the etiological location, or anatomical origin, of the  pain. Duration of benefit is expected to coincide with the duration of the local anesthetic used.  Effectiveness during initial 4-6 hours after procedure(Short-Term Relief): 0 %.  Long-term benefit: Defined as any relief past the pharmacologic duration of the local anesthetics.  Effectiveness past the initial 6 hours after procedure(Long-Term Relief): 0 %.  Current benefits: Defined as benefit that persist at this time.   Analgesia:  Back to baseline Function: Back to baseline ROM: Back to baseline  Pharmacotherapy Assessment  Analgesic: Hydrocodone/APAP 5/325 1 tablet p.o. twice daily (10 mg/day of hydrocodone)  MME/day: 10 mg/day   Monitoring: [place] PMP: PDMP reviewed during this encounter.       Pharmacotherapy: No side-effects or adverse reactions reported. Compliance: No problems identified. Effectiveness: Clinically acceptable. Plan: Refer to "POC".  UDS:  Summary  Date Value Ref Range Status  09/19/2020 Note  Final    Comment:    ==================================================================== Compliance Drug Analysis, Ur ==================================================================== Test                             Result       Flag       Units  Drug Present and Declared for Prescription Verification   Hydrocodone                    642          EXPECTED   ng/mg creat   Hydromorphone                  189          EXPECTED   ng/mg creat   Dihydrocodeine  108          EXPECTED   ng/mg creat   Norhydrocodone                 971          EXPECTED   ng/mg creat    Sources of hydrocodone include scheduled prescription medications.    Hydromorphone, dihydrocodeine and norhydrocodone are expected    metabolites of hydrocodone. Hydromorphone and dihydrocodeine are    also available as scheduled prescription medications.    Gabapentin                     PRESENT      EXPECTED   Venlafaxine                    PRESENT      EXPECTED   Desmethylvenlafaxine            PRESENT      EXPECTED    Desmethylvenlafaxine is an expected metabolite of venlafaxine.    Acetaminophen                  PRESENT      EXPECTED   Salicylate                     PRESENT      EXPECTED   Metoprolol                     PRESENT      EXPECTED  Drug Present not Declared for Prescription Verification   Lidocaine                      PRESENT      UNEXPECTED  Drug Absent but Declared for Prescription Verification   Cyclobenzaprine                Not Detected UNEXPECTED ==================================================================== Test                      Result    Flag   Units      Ref Range   Creatinine              259              mg/dL      >=20 ==================================================================== Declared Medications:  The flagging and interpretation on this report are based on the  following declared medications.  Unexpected results may arise from  inaccuracies in the declared medications.   **Note: The testing scope of this panel includes these medications:   Cyclobenzaprine (Flexeril)  Gabapentin  Hydrocodone  Metoprolol  Venlafaxine   **Note: The testing scope of this panel does not include small to  moderate amounts of these reported medications:   Acetaminophen  Aspirin   **Note: The testing scope of this panel does not include the  following reported medications:   Albuterol  Apixaban (Eliquis)  Atorvastatin  Caffeine  Docusate  Donepezil (Aricept)  Levothyroxine  Losartan  Vitamin B12  Vitamin D2 ==================================================================== For clinical consultation, please call [phone]. ====================================================================     Laboratory Chemistry Profile   Renal Lab Results  Component Value Date   BUN 17 10/13/2020   CREATININE 0.68 10/13/2020   BCR 16 05/07/2019   GFRAA >60 11/06/2019   GFRNONAA >60 10/13/2020     Hepatic Lab Results   Component Value Date   AST 21 10/12/2020  ALT 16 10/12/2020   ALBUMIN 3.8 10/12/2020   ALKPHOS 58 10/12/2020   HCVAB NEGATIVE 08/24/2014   LIPASE 18 08/20/2014     Electrolytes Lab Results  Component Value Date   NA 139 10/13/2020   K 3.7 10/13/2020   CL 106 10/13/2020   CALCIUM 8.9 10/13/2020   MG 2.1 09/07/2020     Bone Lab Results  Component Value Date   VD25OH 8.6 (L) 05/07/2019   25OHVITD1 59 09/07/2020   25OHVITD2 57 09/07/2020   25OHVITD3 1.9 09/07/2020   TESTOSTERONE 16 04/29/2018     Inflammation (CRP: Acute Phase) (ESR: Chronic Phase) Lab Results  Component Value Date   CRP 3 01/04/2020   ESRSEDRATE 17 01/04/2020   LATICACIDVEN 1.7 04/23/2016       Note: Above Lab results reviewed.  Imaging  CUP PACEART REMOTE DEVICE CHECK ILR summary report received. Battery status OK. Normal device function. No new symptom, tachy, brady, or pause episodes. No new AF episodes. Monthly summary reports and ROV/PRN. LH  Assessment  The primary encounter diagnosis was Chronic lower extremity pain (1ry area of Pain) (Right). Diagnoses of Chronic low back pain (2ry area of Pain) (Bilateral) (R>L) w/ sciatica (Right), Chronic lumbar radiculopathy (Right), Compression fracture of L1 lumbar vertebra, sequela, Compression fracture of L5 vertebra, sequela, DDD (degenerative disc disease), lumbosacral, DDD (degenerative disc disease), thoracolumbar, Grade 1 Lumbar Anterolisthesis (L3/4 and L4/5), Grade 1 Lumbar Retrolisthesis (L2/3), Chronic thoracic back pain (Left), T12 compression fracture, sequela, Lumbar central spinal stenosis (Moderate: L2-3  Severe: L3-4, L4-5) w/ neurogenic claudication, Lumbar foraminal stenosis (Bilateral: L2-3, L3-4, L4-5) (Right: L5-S1) (Severe left L4-5), and Lumbar lateral recess stenosis (Right: L2-3) were also pertinent to this visit.  Plan of Care  Problem-specific:  No problem-specific Assessment & Plan notes found for this encounter.  [name] Douglas has a current medication list which includes the following long-term medication(s): albuterol, apixaban, atorvastatin, vitamin b-12, donepezil, losartan, metoprolol succinate, venlafaxine xr, and levothyroxine.  Pharmacotherapy (Medications Ordered): No orders of the defined types were placed in this encounter.  Orders:  No orders of the defined types were placed in this encounter.  Follow-up plan:   No follow-ups on file.      Interventional Therapies  Risk  Complexity Considerations:   ELIQUIS Anticoagulation (Stop: 3 days  Restart: 6 hrs) PMR: Prior patient of [facility] (Dr. Alba Destine) Neurosurgeon: Deetta Perla, MD (right L 2-3 hemilaminectomy and L3-5 laminectomy surgery canceled on 07/25/2020) Neurologist: Gurney Maxin, MD   Planned  Pending:   Trial of intrathecal opioids to determine if the patient may be a good candidate for an intrathecal pump implant.   Under consideration:   Intrathecal pump implant   Completed:   Diagnostic midline T12-L1 intrathecal single dose injection of PF-Fentanyl (25 mcg) (12/13/2020) (100/0/0/0) Diagnostic/therapeutic right L1-2 LESI x1 (11/10/2020) (100/0/0/0) Diagnostic/therapeutic right L1 TFESI x1 (09/27/2020) (100/100/100/90-100 x 10 days)  Diagnostic/therapeutic right L2 TFESI x1 (09/27/2020) (100/100/100/90-100 x 10 days)  Diagnostic/therapeutic left L4-5 LESI x1 (09/27/2020) (100/100/100/90-100 x 10 days)    Completed at the [facility] by Girtha Hake, MD (PMR): Right L5 TFESI #1 (05/05/2020) - 7 days of pain relief. Bilateral L5 TFESI #2 (06/09/2020) - 24 to 48 hours of pain relief.   Therapeutic  Palliative (PRN) options:   Palliative left L4-5 LESI + right L1 and L2 TFESI #2 combination.    Recent Visits Date Type Provider Dept  12/13/20 Procedure visit Milinda Pointer, MD [facility]  11/22/20  Office Visit Milinda Pointer, MD [facility]  11/10/20 Procedure visit Milinda Pointer, MD [facility]  10/10/20 Office Visit Milinda Pointer, MD [facility]  09/27/20 Procedure visit Milinda Pointer, MD [facility]  Showing recent visits within past 90 days and meeting all other requirements Today's Visits Date Type Provider Dept  12/20/20 Telemedicine Milinda Pointer, MD [facility]  Showing today's visits and meeting all other requirements Future Appointments No visits were found meeting these conditions. Showing future appointments within next 90 days and meeting all other requirements  I discussed the assessment and treatment plan with the patient. The patient was provided an opportunity to ask questions and all were answered. The patient agreed with the plan and demonstrated an understanding of the instructions.  Patient advised to call back or seek an in-person evaluation if the symptoms or condition worsens.  Duration of encounter: 18 minutes.  Note by: Gaspar Cola, MD Date: 12/20/2020; Time: 6:27 PM

## 2021-01-03 ENCOUNTER — Other Ambulatory Visit: Payer: Self-pay | Admitting: Internal Medicine

## 2021-01-03 DIAGNOSIS — E559 Vitamin D deficiency, unspecified: Secondary | ICD-10-CM

## 2021-01-03 NOTE — Progress Notes (Signed)
Unsuccessful attempt to contact patient for Virtual Visit (Pain Management Telehealth)   Patient provided contact information:  [phone] (home); [phone] (mobile); (Preferred) [phone] [email or web address]   Pre-screening:  Our staff was successful in contacting [name] Douglas using the above provided information.   I unsuccessfully attempted to make contact with [name] Douglas twice on 01/04/2021 via telephone. I was unable to complete the virtual encounter due to call going directly to voicemail. I was able to leave a message.  Pharmacotherapy Assessment  Analgesic: Hydrocodone/APAP 5/325 1 tablet p.o. twice daily (10 mg/day of hydrocodone)  MME/day: 10 mg/day   Follow-up plan:   Reschedule Visit.     Interventional Therapies  Risk  Complexity Considerations:   ELIQUIS Anticoagulation (Stop: 3 days  Restart: 6 hrs) PMR: Prior patient of [facility] (Dr. Alba Destine) Neurosurgeon: Deetta Perla, MD (right L 2-3 hemilaminectomy and L3-5 laminectomy surgery canceled on 07/25/2020) Neurologist: Gurney Maxin, MD   Planned  Pending:      Under consideration:   The patient was scheduled by Dr. Lacinda Axon (neurosurgeon) for lumbar decompression for severe spinal stenosis on 07/25/2020, but it was canceled secondary to the patient having had a CVA around her between 12/21/2019 and 07/14/2020.  At this point we have tried several things to help this patient, but none of them seem to help.  The patient's most severe spinal stenosis seems to be at the L3-4 and L4-5 level with bilateral foraminal stenosis as well as central canal stenosis, associated with a grade 1 anterolisthesis of L3/4 and L4/5.  Based on the results that we have obtained from the diagnostic blocks, I believe that we can offer the patient palliative treatments with bilateral L3 and L4 TFESIs, with the understanding that they will not be capable of providing her with any long-term benefit.  The most definite solution to  this patient's problem is to undergo the decompression, as soon as it is safe for her to do it.  For this, my understanding is that she needs to be evaluated by her neurologist, Dr. Gurney Maxin for clearance before Dr. Lacinda Axon decides to proceed with rescheduling the surgery.   Completed:   Diagnostic midline T12-L1 intrathecal single dose injection of PF-Fentanyl (25 mcg) (12/13/2020) (100/0/0/0) Diagnostic/therapeutic right L1-2 LESI x1 (11/10/2020) (100/0/0/0) Diagnostic/therapeutic right L1 TFESI x1 (09/27/2020) (100/100/100/90-100 x 10 days)  Diagnostic/therapeutic right L2 TFESI x1 (09/27/2020) (100/100/100/90-100 x 10 days)  Diagnostic/therapeutic left L4-5 LESI x1 (09/27/2020) (100/100/100/90-100 x 10 days)    Completed at the [facility] by Girtha Hake, MD (PMR): Right L5 TFESI #1 (05/05/2020) - 7 days of pain relief. Bilateral L5 TFESI #2 (06/09/2020) - 24 to 48 hours of pain relief.   Therapeutic  Palliative (PRN) options:   Palliative left L4-5 LESI + right L1 and L2 TFESI #2 combination.     Recent Visits Date Type Provider Dept  12/20/20 Telemedicine Milinda Pointer, MD [facility]  12/13/20 Procedure visit Milinda Pointer, MD [facility]  11/22/20 Office Visit Milinda Pointer, MD [facility]  11/10/20 Procedure visit Milinda Pointer, MD [facility]  10/10/20 Office Visit Milinda Pointer, MD [facility]  Showing recent visits within past 90 days and meeting all other requirements Today's Visits Date Type Provider Dept  01/04/21 Appointment Milinda Pointer, MD [facility]  Showing today's visits and meeting all other requirements Future Appointments No visits were found meeting these conditions. Showing future appointments within next 90 days and meeting all other requirements   Note  by: Gaspar Cola, MD Date: 01/04/2021; Time: 5:08 PM

## 2021-01-04 ENCOUNTER — Ambulatory Visit: Payer: Medicare Other | Attending: Pain Medicine | Admitting: Pain Medicine

## 2021-01-04 DIAGNOSIS — M5459 Other low back pain: Secondary | ICD-10-CM

## 2021-01-04 DIAGNOSIS — M48061 Spinal stenosis, lumbar region without neurogenic claudication: Secondary | ICD-10-CM

## 2021-01-04 DIAGNOSIS — M4316 Spondylolisthesis, lumbar region: Secondary | ICD-10-CM

## 2021-01-04 DIAGNOSIS — G8929 Other chronic pain: Secondary | ICD-10-CM

## 2021-01-04 DIAGNOSIS — M4310 Spondylolisthesis, site unspecified: Secondary | ICD-10-CM

## 2021-01-04 DIAGNOSIS — Z79899 Other long term (current) drug therapy: Secondary | ICD-10-CM

## 2021-01-04 DIAGNOSIS — Z7901 Long term (current) use of anticoagulants: Secondary | ICD-10-CM

## 2021-01-04 DIAGNOSIS — Z8673 Personal history of transient ischemic attack (TIA), and cerebral infarction without residual deficits: Secondary | ICD-10-CM

## 2021-01-04 DIAGNOSIS — M47817 Spondylosis without myelopathy or radiculopathy, lumbosacral region: Secondary | ICD-10-CM

## 2021-01-04 DIAGNOSIS — S22080S Wedge compression fracture of T11-T12 vertebra, sequela: Secondary | ICD-10-CM

## 2021-01-04 DIAGNOSIS — M79604 Pain in right leg: Secondary | ICD-10-CM

## 2021-01-04 DIAGNOSIS — M48062 Spinal stenosis, lumbar region with neurogenic claudication: Secondary | ICD-10-CM

## 2021-01-04 DIAGNOSIS — M5416 Radiculopathy, lumbar region: Secondary | ICD-10-CM

## 2021-01-04 DIAGNOSIS — M5417 Radiculopathy, lumbosacral region: Secondary | ICD-10-CM

## 2021-01-04 DIAGNOSIS — G894 Chronic pain syndrome: Secondary | ICD-10-CM

## 2021-01-05 ENCOUNTER — Other Ambulatory Visit: Payer: Self-pay | Admitting: Pain Medicine

## 2021-01-05 ENCOUNTER — Emergency Department (HOSPITAL_COMMUNITY)
Admission: EM | Admit: 2021-01-05 | Discharge: 2021-01-06 | Disposition: A | Discharge status: Home / Self Care | Payer: Medicare Other | Attending: Emergency Medicine | Admitting: Emergency Medicine

## 2021-01-05 ENCOUNTER — Other Ambulatory Visit: Payer: Self-pay

## 2021-01-05 ENCOUNTER — Encounter (HOSPITAL_COMMUNITY): Payer: Self-pay | Admitting: Emergency Medicine

## 2021-01-05 DIAGNOSIS — J45909 Unspecified asthma, uncomplicated: Secondary | ICD-10-CM | POA: Insufficient documentation

## 2021-01-05 DIAGNOSIS — E039 Hypothyroidism, unspecified: Secondary | ICD-10-CM | POA: Diagnosis not present

## 2021-01-05 DIAGNOSIS — Z96653 Presence of artificial knee joint, bilateral: Secondary | ICD-10-CM | POA: Diagnosis not present

## 2021-01-05 DIAGNOSIS — Z79899 Other long term (current) drug therapy: Secondary | ICD-10-CM | POA: Insufficient documentation

## 2021-01-05 DIAGNOSIS — J449 Chronic obstructive pulmonary disease, unspecified: Secondary | ICD-10-CM | POA: Diagnosis not present

## 2021-01-05 DIAGNOSIS — R0902 Hypoxemia: Secondary | ICD-10-CM | POA: Diagnosis not present

## 2021-01-05 DIAGNOSIS — R202 Paresthesia of skin: Secondary | ICD-10-CM | POA: Diagnosis not present

## 2021-01-05 DIAGNOSIS — F17210 Nicotine dependence, cigarettes, uncomplicated: Secondary | ICD-10-CM | POA: Insufficient documentation

## 2021-01-05 DIAGNOSIS — M549 Dorsalgia, unspecified: Secondary | ICD-10-CM | POA: Diagnosis not present

## 2021-01-05 DIAGNOSIS — I10 Essential (primary) hypertension: Secondary | ICD-10-CM | POA: Diagnosis not present

## 2021-01-05 DIAGNOSIS — M5442 Lumbago with sciatica, left side: Secondary | ICD-10-CM | POA: Diagnosis not present

## 2021-01-05 DIAGNOSIS — M5441 Lumbago with sciatica, right side: Secondary | ICD-10-CM | POA: Diagnosis not present

## 2021-01-05 DIAGNOSIS — M545 Low back pain, unspecified: Secondary | ICD-10-CM | POA: Insufficient documentation

## 2021-01-05 DIAGNOSIS — Z9861 Coronary angioplasty status: Secondary | ICD-10-CM | POA: Diagnosis not present

## 2021-01-05 NOTE — ED Provider Notes (Signed)
Emergency Medicine Provider Triage Evaluation Note  [name] Douglas , a 80 y.o. female  was evaluated in triage.  Pt complains of back pain.  Patient has had back pain for years.  Followed by pain physicians at [facility]  Reports that she "will not get any pain meds from them".  She unfortunately is not a surgical candidate.  There was discussions about a pain pump but she unfortunately is not a surgical candidate for that either.  Patient states that she "needs to get out of pain".  Daughter at bedside is a Therapist, sports with [employer], is requesting the patient be admitted for "observation" and pain management.  Patient denies any increasing symptoms, no recent falls.  States she is just "tired of being in pain".  Reportedly her pain doctors have given her oxycodone and it "does not touch her".  Denies any loss of bowel bladder control.  No numbness or tingling in her upper or lower extremities.  Review of Systems  Positive: As above Negative: As above  Physical Exam  BP (!) 154/48 (BP Location: Left Arm)   Pulse 74   Temp 97.8 F (36.6 C) (Oral)   Resp 20   SpO2 94%  Gen:   Awake, no distress  Resp:  Normal effort  MSK:   Moves extremities without difficulty, 5/5 strength in upper and lower extremities bilaterally.  Sensations intact. Other:    Medical Decision Making  Medically screening exam initiated at 9:02 PM.  Appropriate orders placed.  [name] Douglas was informed that the remainder of the evaluation will be completed by another provider, this initial triage assessment does not replace that evaluation, and the importance of remaining in the ED until their evaluation is complete.    Lyndel Safe 01/05/21 2111    Arnaldo Natal, MD 01/05/21 2259

## 2021-01-05 NOTE — ED Triage Notes (Signed)
Patient reports chronic low back pain for several years worse these past several days , denies recent injury , no fever or chills .

## 2021-01-06 DIAGNOSIS — M545 Low back pain, unspecified: Secondary | ICD-10-CM | POA: Diagnosis not present

## 2021-01-06 MED ORDER — METHYLPREDNISOLONE 4 MG PO TBPK: ORAL_TABLET | ORAL | 0 refills | Status: DC

## 2021-01-06 MED ORDER — DEXAMETHASONE SODIUM PHOSPHATE 10 MG/ML IJ SOLN
10.0000 mg | Freq: Once | INTRAMUSCULAR | Status: AC
  Administered 2021-01-06: 10 mg via INTRAMUSCULAR
  Filled 2021-01-06: qty 1

## 2021-01-06 MED ORDER — DIAZEPAM 2 MG PO TABS
2.0000 mg | ORAL_TABLET | Freq: Once | ORAL | Status: AC
  Administered 2021-01-06: 2 mg via ORAL
  Filled 2021-01-06: qty 1

## 2021-01-06 MED ORDER — OXYCODONE HCL 5 MG PO TABS: 5.0000 mg | ORAL_TABLET | Freq: Three times a day (TID) | ORAL | 0 refills | Status: DC | PRN

## 2021-01-06 NOTE — ED Provider Notes (Signed)
[name] Provider Note   CSN: [id number] Arrival date & time: 01/05/21  2050     History Chief Complaint  Patient presents with  . Back Pain    [name] [facility] is a 80 y.o. female.  The history is provided by the patient.  Back Pain Location:  Lumbar spine Quality:  Aching Radiates to:  L foot and R foot Pain severity:  Mild Onset quality:  Gradual Timing:  Intermittent Progression:  Waxing and waning Chronicity:  Chronic Context comment:  Hx of spinal stenosis worsening pain. Seeing new spine doctor in a few weeks. Told she need surgery but has been unable to fine a surgeon to operate. Relieved by:  Nothing Worsened by:  Palpation and ambulation Associated symptoms: numbness and paresthesias   Associated symptoms: no abdominal pain, no abdominal swelling, no bladder incontinence, no bowel incontinence, no chest pain, no dysuria, no fever, no headaches, no leg pain, no pelvic pain, no perianal numbness, no tingling, no weakness and no weight loss        Past Medical History:  Diagnosis Date  . Anxiety   . Arthritis    "shoulders" (08/20/2014)  . Asthma   . Chronic lower back pain   . Complication of anesthesia    "agitated & restless after knee replacement in 2008"  . COPD (chronic obstructive pulmonary disease) ([place])   . DDD (degenerative disc disease), lumbar   . Depression   . Diverticulosis   . GERD (gastroesophageal reflux disease)   . High cholesterol   . Hypertension   . Hypothyroidism   . Memory loss    mild  . On home oxygen therapy    "2L at night" (08/20/2014)  . Peripheral vascular disease ([place])   . Pre-diabetes   . Shortness of breath dyspnea    with exertion  . Stroke ([place])    mild, no deficits    Patient Active Problem List   Diagnosis Date Noted  . Compression fracture of fifth lumbar vertebra ([place]) 12/20/2020  . DDD (degenerative disc disease), thoracolumbar 11/22/2020  . DDD (degenerative disc  disease), thoracic 11/10/2020  . TIA (transient ischemic attack) 10/12/2020  . Left shoulder pain 10/12/2020  . Chronic thoracic back pain (Left) 10/10/2020  . T12 compression fracture, sequela 10/10/2020  . Lumbar foraminal stenosis (Bilateral: L2-3, L3-4, L4-5) (Right: L5-S1) (Severe left L4-5) 09/08/2020  . Grade 1 Lumbar Anterolisthesis (L3/4 and L4/5) 09/08/2020  . Grade 1 Lumbar Retrolisthesis (L2/3) 09/08/2020  . Protrusion of lumbar intervertebral disc (Multilevel) 09/08/2020  . Compression fracture of L1 lumbar vertebra, sequela 09/08/2020  . Other intervertebral disc degeneration, lumbar region 09/08/2020  . Lumbosacral facet syndrome (Multilevel) (Bilateral) 09/08/2020  . Lumbar facet hypertrophy (Multilevel) (Bilateral) 09/08/2020  . Ligamentum flavum hypertrophy 09/08/2020  . Lumbar lateral recess stenosis (Right: L2-3) 09/08/2020  . DDD (degenerative disc disease), lumbosacral 09/08/2020  . Compression fracture of L5 vertebra ([place]) 09/08/2020  . Osteoporosis with pathological fracture of lumbar vertebra ([place]) 09/08/2020  . Vitamin B12 deficiency 09/08/2020  . Chronic pain syndrome 09/07/2020  . Pharmacologic therapy 09/07/2020  . Disorder of skeletal system 09/07/2020  . Problems influencing health status 09/07/2020  . Uncomplicated opioid dependence ([place]) 09/07/2020  . Chronic low back pain (2ry area of Pain) (Bilateral) (R>L) w/ sciatica (Right) 09/07/2020  . Chronic lower extremity pain (1ry area of Pain) (Right) 09/07/2020  . Chronic lumbar radiculopathy (Right) 09/07/2020  . Lumbosacral radiculopathy at L2 (Right) 09/07/2020  . Lumbar radiculitis (  L2 dermatome) (Right) 09/07/2020  . Unspecified inflammatory spondylopathy, lumbar region ([place]) 09/07/2020  . Chronic anticoagulation ([place]) 09/07/2020  . Abnormal MRI, lumbar spine (04/20/2020) 09/07/2020  . Intractable low back pain 08/03/2020  . Fall down stairs 08/03/2020  . Hemorrhagic stroke ([place]) 08/03/2020  .  Dizziness and giddiness 07/26/2020  . Other chronic pain 07/26/2020  . Other sequelae of cerebral infarction 07/18/2020  . CVA (cerebral vascular accident) ([place]) 07/14/2020  . Blurred vision 07/14/2020  . Lumbar central spinal stenosis (Moderate: L2-3  Severe: L3-4, L4-5) w/ neurogenic claudication 05/31/2020  . Muscle spasm 04/10/2020  . Urinary tract infection with hematuria 04/10/2020  . Headache disorder 12/21/2019  . History of stroke 12/21/2019  . Dizziness 11/06/2019  . Atopic dermatitis 10/28/2019  . History of CVA (cerebrovascular accident) 10/07/2019  . Intractable episodic cluster headache 09/20/2019  . Other symptoms and signs involving the nervous system 09/20/2019  . Anxiety disorder, unspecified 08/21/2019  . Dependence on supplemental oxygen 08/21/2019  . Presence of artificial knee joint, bilateral 08/21/2019  . Nicotine dependence, cigarettes, uncomplicated [id number]  . Iron deficiency anemia 05/17/2019  . Vitamin D deficiency 05/17/2019  . Flu vaccine need 05/17/2019  . Encounter for general adult medical examination with abnormal findings 04/05/2019  . Nausea and vomiting 04/05/2019  . Dysuria 04/05/2019  . Need for vaccination against Streptococcus pneumoniae using pneumococcal conjugate vaccine 7 04/05/2019  . Occlusion and stenosis of bilateral carotid arteries 11/24/2017  . Subarachnoid hemorrhage ([place]) 12/09/2016  . Solitary pulmonary nodule 08/24/2016  . Primary localized osteoarthritis of right knee 07/03/2016  . Ileus ([place]) 04/24/2016  . Colitis due to Clostridium difficile   . Recurrent Clostridium difficile diarrhea   . Enteritis due to Clostridium difficile 08/22/2014  . Diarrhea 08/20/2014  . Essential hypertension 08/20/2014  . Hyperlipidemia 08/20/2014  . Hypothyroidism 08/20/2014  . Tobacco abuse 08/20/2014  . COPD (chronic obstructive pulmonary disease) ([place]) 08/20/2014  . Acute diarrhea 07/27/2014    Past Surgical History:  Procedure  Laterality Date  . CARDIAC CATHETERIZATION  ~ 2007  . EXCISIONAL HEMORRHOIDECTOMY  1960's  . EYE SURGERY Bilateral    Cataract extraction with IOL  . KNEE ARTHROSCOPY Left X 2 <2008  . KNEE ARTHROSCOPY WITH SUBCHONDROPLASTY Right 04/19/2016   Procedure: KNEE ARTHROSCOPY WITH SUBCHONDROPLASTY, PARTIAL MENISCECTOMY;  Surgeon: Hessie Knows, MD;  Location: [facility] ORS;  Service: Orthopedics;  Laterality: Right;  . LOOP RECORDER INSERTION N/A 01/02/2018   Procedure: LOOP RECORDER INSERTION;  Surgeon: Deboraha Sprang, MD;  Location: [place] [facility];  Service: Cardiovascular;  Laterality: N/A;  . SHOULDER OPEN ROTATOR CUFF REPAIR Right 2001  . TONSILLECTOMY  ~ 1950  . TOTAL KNEE ARTHROPLASTY Left 2008  . TOTAL KNEE ARTHROPLASTY Right 07/03/2016   Procedure: TOTAL KNEE ARTHROPLASTY;  Surgeon: Hessie Knows, MD;  Location: [facility] ORS;  Service: Orthopedics;  Laterality: Right;  . TUBAL LIGATION  1973  . VAGINAL HYSTERECTOMY  1974     OB History   No obstetric history on file.     Family History  Problem Relation Age of Onset  . Diabetes Mother   . Hypertension Mother   . Lung cancer Mother   . Congestive Heart Failure Father   . Congestive Heart Failure Sister     Social History   Tobacco Use  . Smoking status: Current Some Day Smoker    Packs/day: 0.25    Years: 53.00    Pack years: 13.25    Types: Cigarettes  . Smokeless tobacco: Never  Used  . Tobacco comment: 3 cigarettes in week  Vaping Use  . Vaping Use: Never used  Substance Use Topics  . Alcohol use: No  . Drug use: No    Home Medications Prior to Admission medications   Medication Sig Start Date End Date Taking? Authorizing Provider  methylPREDNISolone (MEDROL DOSEPAK) 4 MG TBPK tablet Follow package insert 01/06/21  Yes Karlin Binion, DO  oxyCODONE (ROXICODONE) 5 MG immediate release tablet Take 1 tablet (5 mg total) by mouth every 8 (eight) hours as needed for up to 20 doses for breakthrough pain. 01/06/21  Yes  Stephano Arrants, DO  albuterol (VENTOLIN HFA) 108 (90 Base) MCG/ACT inhaler Inhale 2 puffs into the lungs every 6 (six) hours as needed for wheezing or shortness of breath. 08/05/20   Shelly Coss, MD  apixaban (ELIQUIS) 5 MG TABS tablet Take 1 tablet (5 mg total) by mouth 2 (two) times daily. Patient taking differently: Take 5 mg by mouth daily. 09/13/20   Deboraha Sprang, MD  atorvastatin (LIPITOR) 80 MG tablet Take 1 tablet (80 mg total) by mouth daily at 6 PM. 08/22/20   Boscia, Greer Ee, NP  Cyanocobalamin (VITAMIN B-12) 5000 MCG SUBL Place 1 tablet (5,000 mcg total) under the tongue daily. 09/08/20 12/07/20  Milinda Pointer, MD  cyclobenzaprine (FLEXERIL) 5 MG tablet TAKE 1 TABLET BY MOUTH AT BEDTIME 12/05/20   Lavera Guise, MD  docusate sodium (COLACE) 100 MG capsule Take 100 mg by mouth daily as needed for mild constipation.     [provider]  donepezil (ARICEPT) 5 MG tablet Take 1 tablet (5 mg total) by mouth at bedtime. 04/28/20   Ronnell Freshwater, NP  gabapentin (NEURONTIN) 100 MG capsule Take 200 mg by mouth 3 (three) times daily. 06/24/20   [provider]  levothyroxine (SYNTHROID) 75 MCG tablet Take 1 tablet (75 mcg total) by mouth daily before breakfast. 12/01/20   Lavera Guise, MD  losartan (COZAAR) 25 MG tablet TAKE 1 TABLET BY MOUTH DAILY 11/02/20   Luiz Ochoa, NP  metoprolol succinate (TOPROL-XL) 50 MG 24 hr tablet Take 2 tablets (100 mg total) by mouth daily. 09/28/20   Lavera Guise, MD  venlafaxine XR (EFFEXOR-XR) 75 MG 24 hr capsule TAKE 2 CAPSULES BY MOUTH DAILY. 09/28/20   Lavera Guise, MD  Vitamin D, Ergocalciferol, (DRISDOL) 1.25 MG (50000 UNIT) CAPS capsule TAKE 1 CAPSULE BY MOUTH EVERY FRIDAY. 01/03/21   Lavera Guise, MD    Allergies    Heparin  Review of Systems   Review of Systems  Constitutional: Negative for chills, fever and weight loss.  HENT: Negative for ear pain and sore throat.   Eyes: Negative for pain and visual disturbance.   Respiratory: Negative for cough and shortness of breath.   Cardiovascular: Negative for chest pain and palpitations.  Gastrointestinal: Negative for abdominal pain, bowel incontinence and vomiting.  Genitourinary: Negative for bladder incontinence, dysuria, hematuria and pelvic pain.  Musculoskeletal: Positive for back pain and gait problem. Negative for arthralgias.  Skin: Negative for color change and rash.  Neurological: Positive for numbness and paresthesias. Negative for tingling, seizures, syncope, weakness and headaches.  All other systems reviewed and are negative.   Physical Exam Updated Vital Signs BP (!) 153/54 (BP Location: Left Arm)   Pulse 74   Temp 98.4 F (36.9 C) (Oral)   Resp (!) 22   SpO2 97%   Physical Exam Vitals and nursing note reviewed.  Constitutional:  General: She is not in acute distress.    Appearance: She is well-developed.  HENT:     Head: Normocephalic and atraumatic.  Eyes:     Conjunctiva/sclera: Conjunctivae normal.  Cardiovascular:     Rate and Rhythm: Normal rate and regular rhythm.     Pulses: Normal pulses.     Heart sounds: No murmur heard.   Pulmonary:     Effort: Pulmonary effort is normal. No respiratory distress.     Breath sounds: Normal breath sounds.  Abdominal:     Palpations: Abdomen is soft.     Tenderness: There is no abdominal tenderness.  Musculoskeletal:     Cervical back: Neck supple.  Skin:    General: Skin is warm and dry.  Neurological:     General: No focal deficit present.     Mental Status: She is alert and oriented to person, place, and time.     Cranial Nerves: No cranial nerve deficit.     Motor: No weakness.     Coordination: Coordination normal.     Comments: 5+ out of 5 strength to bilateral lower extremities, normal sensation     ED Results / Procedures / Treatments   Labs (all labs ordered are listed, but only abnormal results are displayed) Labs Reviewed - No data to  display  EKG None  Radiology No results found.  Procedures Procedures   Medications Ordered in ED Medications  diazepam (VALIUM) tablet 2 mg (2 mg Oral Given 01/06/21 0227)  dexamethasone (DECADRON) injection 10 mg (10 mg Intramuscular Given 01/06/21 0218)    ED Course  I have reviewed the triage vital signs and the nursing notes.  Pertinent labs & imaging results that were available during my care of the patient were reviewed by me and considered in my medical decision making (see chart for details).    MDM Rules/Calculators/A&P                          ASUKA DUSSEAU is here with back pain.  Normal vitals.  No fever.  Worsening acute on chronic back pain.  History of spinal stenosis and needs likely surgery but has been unable to find surgeon to operate yet.  Follows with pain management.  Having breakthrough pain today.  Denies any saddle anesthesia, urinary retention, loss of bowels.  No concern for cauda equina.  Normal neurological exam.  Normal strength and sensation in the lower extremities.  Pain mostly in the lower back with some sciatic symptoms.  Will prescribe Medrol Dosepak and Roxicodone for breakthrough pain.  Recommend lidocaine patches as well.  She was given Valium and Decadron in the ED with some improvement.  She understands return precautions and discharged in the ED in good condition.  This chart was dictated using voice recognition software.  Despite best efforts to proofread,  errors can occur which can change the documentation meaning.   Final Clinical Impression(s) / ED Diagnoses Final diagnoses:  Acute low back pain with bilateral sciatica, unspecified back pain laterality    Rx / DC Orders ED Discharge Orders         Ordered    oxyCODONE (ROXICODONE) 5 MG immediate release tablet  Every 8 hours PRN        01/06/21 0332    methylPREDNISolone (MEDROL DOSEPAK) 4 MG TBPK tablet        01/06/21 0332           Lennice Sites, DO  01/06/21 0335  

## 2021-01-10 NOTE — Progress Notes (Signed)
Carelink Summary Report / Loop Recorder 

## 2021-01-22 LAB — CUP PACEART REMOTE DEVICE CHECK
Date Time Interrogation Session: 20220603002403
Implantable Pulse Generator Implant Date: 20190516

## 2021-01-23 ENCOUNTER — Ambulatory Visit (INDEPENDENT_AMBULATORY_CARE_PROVIDER_SITE_OTHER): Payer: Medicare Other

## 2021-01-23 DIAGNOSIS — I639 Cerebral infarction, unspecified: Secondary | ICD-10-CM

## 2021-01-25 ENCOUNTER — Telehealth: Payer: Self-pay | Admitting: *Deleted

## 2021-01-25 DIAGNOSIS — M48062 Spinal stenosis, lumbar region with neurogenic claudication: Secondary | ICD-10-CM | POA: Diagnosis not present

## 2021-01-25 DIAGNOSIS — I10 Essential (primary) hypertension: Secondary | ICD-10-CM | POA: Diagnosis not present

## 2021-01-25 NOTE — Telephone Encounter (Signed)
   [facility] Pre-operative Risk Assessment    Patient Name: [name] [facility]  DOB: 1940-10-12  MRN: [id number]   HEARTCARE STAFF: - Please ensure there is not already an duplicate clearance open for this procedure. - Under Visit Info/Reason for Call, type in Other and utilize the format Clearance MM/DD/YY or Clearance TBD. Do not use dashes or single digits. - If request is for dental extraction, please clarify the # of teeth to be extracted. - If the patient is currently at the dentist's office, call Pre-Op APP to address. If the patient is not currently in the dentist office, please route to the Pre-Op pool  Request for surgical clearance:  1. What type of surgery is being performed? L3-4, L4-5 LAMINECTOMY/FORAMINOTOMY   2. When is this surgery scheduled? TBD   3. What type of clearance is required (medical clearance vs. Pharmacy clearance to hold med vs. Both)? BOTH  4. Are there any medications that need to be held prior to surgery and how long? ELIQUIS   5. Practice name and name of physician performing surgery? [facility]; DR. Kristeen Miss   6. What is the office phone number? [phone]   7.   What is the office fax number? [place]: JESSICA  8.   Anesthesia type (None, local, MAC, general) ? GENERAL   Julaine Hua 01/25/2021, 4:06 PM  _________________________________________________________________   (provider comments below)

## 2021-01-26 NOTE — Telephone Encounter (Signed)
Patient with diagnosis of afib on Eliquis for anticoagulation.    Procedure: L3-4, L4-5 LAMINECTOMY/FORAMINOTOMY  Date of procedure: TBD  CHA2DS2-VASc Score = 8  This indicates a 10.8% annual risk of stroke. The patient's score is based upon: CHF History: No HTN History: Yes Diabetes History: Yes Stroke History: Yes Vascular Disease History: Yes Age Score: 2 Gender Score: 1   CrCl 79mL/min Platelet count 210K  Typically hold Eliquis for 3 days prior to spinal procedure, however ideally want to minimize time off of anticoag since pt is at elevated cardiovascular risk with hx of 3 strokes and afib. Will defer to MD for input.

## 2021-01-30 NOTE — Telephone Encounter (Signed)
Would suggest taht 3 days is probably the best answer.  Thanks SK

## 2021-01-31 NOTE — Telephone Encounter (Signed)
    [name] DOB:  [birth year]  MRN:  [id number]   Primary Cardiologist: Dr. Caryl Comes, MD  Chart reviewed as part of pre-operative protocol coverage. Given past medical history and time since last visit, based on ACC/AHA guidelines, [name] Douglas would be at higher CV risk for the planned procedure given hx of multiple CVA's and atrial fibrillation. She was last seen by Dr. Caryl Comes 08/2020 and was without CV symptoms. Most recent issues concern with back pain.   Patient with diagnosis of afib on Eliquis for anticoagulation.     Procedure: L3-4, L4-5 LAMINECTOMY/FORAMINOTOMY  Date of procedure: TBD   CHA2DS2-VASc Score = 8  This indicates a 10.8% annual risk of stroke. The patient's score is based upon: CHF History: No HTN History: Yes Diabetes History: Yes Stroke History: Yes Vascular Disease History: Yes Age Score: 2 Gender Score: 1    CrCl 40mL/min Platelet count 210K   Typically hold Eliquis for 3 days prior to spinal procedure, however ideally want to minimize time off of anticoag since pt is at elevated cardiovascular risk with hx of 3 strokes and afib. Final decision deferred to Dr. Caryl Comes who then recommends a 3 day hold prior to procedure.  The patient was advised that if she develops new symptoms prior to surgery to contact our office to arrange for a follow-up visit, and she verbalized understanding.  I will route this recommendation to the requesting party via Epic fax function and remove from pre-op pool.  Please call with questions.  Kathyrn Drown, NP 01/31/2021, 8:24 AM

## 2021-02-02 NOTE — Pre-Procedure Instructions (Signed)
Surgical Instructions    Your procedure is scheduled on Monday, June 20th.  Report to [facility] Main Entrance "A" at 05:30 A.M., then check in with the Admitting office.  Call this number if you have problems the morning of surgery:  [phone]   If you have any questions prior to your surgery date call [phone]: Open Monday-Friday 8am-4pm.    Remember:  Do not eat or drink after midnight the night before your surgery.     Take these medicines the morning of surgery with A SIP OF WATER: gabapentin (NEURONTIN) levothyroxine (SYNTHROID) metoprolol succinate (TOPROL-XL) venlafaxine XR (EFFEXOR-XR)  IF NEEDED: traMADol (ULTRAM)    Per your Doctor's instructions, STOP apixaban (ELIQUIS) 3 Days prior to surgery.   As of today, STOP taking any Aspirin (unless otherwise instructed by your surgeon), Aspirin-Acetaminophen-Caffeine (GOODY HEADACHE PO), Aleve, Naproxen, Ibuprofen, Motrin, Advil, Goody's, BC's, all herbal medications, fish oil, and all vitamins.            Do not wear jewelry or makeup. Do not wear lotions, powders, perfumes, or deodorant. Do not shave 48 hours prior to surgery.   Do not bring valuables to the hospital. DO Not wear nail polish, gel polish, artificial nails, or any other type of covering on natural nails including finger and toenails. If patients have artificial nails, gel coating, etc. that need to be removed by a nail salon please have this removed prior to surgery or surgery may need to be canceled/delayed if the surgeon/ anesthesia feels like the patient is unable to be adequately monitored.             [facility] is not responsible for any belongings or valuables.  Do NOT Smoke (Tobacco/Vaping) or drink Alcohol 24 hours prior to your procedure. If you use a CPAP at night, you may bring all equipment for your overnight stay.   Contacts, glasses, dentures or bridgework may not be worn into surgery, please bring cases for these belongings    For patients admitted to the hospital, discharge time will be determined by your treatment team.   Patients discharged the day of surgery will not be allowed to drive home, and someone needs to stay with them for 24 hours.  ONLY 1 SUPPORT PERSON MAY BE PRESENT WHILE YOU ARE IN SURGERY. IF YOU ARE TO BE ADMITTED ONCE YOU ARE IN YOUR ROOM YOU WILL BE ALLOWED TWO (2) VISITORS.  Minor children may have two parents present. Special consideration for safety and communication needs will be reviewed on a case by case basis.  Special instructions:    Oral Hygiene is also important to reduce your risk of infection.  Remember - BRUSH YOUR TEETH THE MORNING OF SURGERY WITH YOUR REGULAR TOOTHPASTE   - Preparing For Surgery  Before surgery, you can play an important role. Because skin is not sterile, your skin needs to be as free of germs as possible. You can reduce the number of germs on your skin by washing with CHG (chlorahexidine gluconate) Soap before surgery.  CHG is an antiseptic cleaner which kills germs and bonds with the skin to continue killing germs even after washing.     Please do not use if you have an allergy to CHG or antibacterial soaps. If your skin becomes reddened/irritated stop using the CHG.  Do not shave (including legs and underarms) for at least 48 hours prior to first CHG shower. It is OK to shave your face.  Please follow these instructions carefully.  Shower the NIGHT BEFORE SURGERY and the MORNING OF SURGERY with CHG Soap.   If you chose to wash your hair, wash your hair first as usual with your normal shampoo. After you shampoo, rinse your hair and body thoroughly to remove the shampoo.  Then [employer] and genitals (private parts) with your normal soap and rinse thoroughly to remove soap.  After that Use CHG Soap as you would any other liquid soap. You can apply CHG directly to the skin and wash gently with a scrungie or a clean washcloth.   Apply the CHG  Soap to your body ONLY FROM THE NECK DOWN.  Do not use on open wounds or open sores. Avoid contact with your eyes, ears, mouth and genitals (private parts). Wash Face and genitals (private parts)  with your normal soap.   Wash thoroughly, paying special attention to the area where your surgery will be performed.  Thoroughly rinse your body with warm water from the neck down.  DO NOT shower/wash with your normal soap after using and rinsing off the CHG Soap.  Pat yourself dry with a CLEAN TOWEL.  Wear CLEAN PAJAMAS to bed the night before surgery  Place CLEAN SHEETS on your bed the night before your surgery  DO NOT SLEEP WITH PETS.   Day of Surgery:  Take a shower with CHG soap. Wear Clean/Comfortable clothing the morning of surgery Do not apply any deodorants/lotions.   Remember to brush your teeth WITH YOUR REGULAR TOOTHPASTE.   Please read over the following fact sheets that you were given.

## 2021-02-02 NOTE — Progress Notes (Signed)
IBM'd Dr. Ellene Route for procedure orders.

## 2021-02-03 ENCOUNTER — Encounter (HOSPITAL_COMMUNITY): Payer: Self-pay

## 2021-02-03 ENCOUNTER — Other Ambulatory Visit: Payer: Self-pay

## 2021-02-03 ENCOUNTER — Encounter (HOSPITAL_COMMUNITY)
Admission: RE | Admit: 2021-02-03 | Discharge: 2021-02-03 | Disposition: A | Discharge status: Home / Self Care | Payer: Medicare Other | Source: Ambulatory Visit | Attending: Neurological Surgery | Admitting: Neurological Surgery

## 2021-02-03 DIAGNOSIS — G44009 Cluster headache syndrome, unspecified, not intractable: Secondary | ICD-10-CM | POA: Diagnosis not present

## 2021-02-03 DIAGNOSIS — R5381 Other malaise: Secondary | ICD-10-CM | POA: Diagnosis not present

## 2021-02-03 DIAGNOSIS — E039 Hypothyroidism, unspecified: Secondary | ICD-10-CM | POA: Diagnosis not present

## 2021-02-03 DIAGNOSIS — Z8673 Personal history of transient ischemic attack (TIA), and cerebral infarction without residual deficits: Secondary | ICD-10-CM | POA: Diagnosis not present

## 2021-02-03 DIAGNOSIS — F0150 Vascular dementia without behavioral disturbance: Secondary | ICD-10-CM | POA: Diagnosis not present

## 2021-02-03 DIAGNOSIS — R278 Other lack of coordination: Secondary | ICD-10-CM | POA: Diagnosis not present

## 2021-02-03 DIAGNOSIS — M1990 Unspecified osteoarthritis, unspecified site: Secondary | ICD-10-CM | POA: Diagnosis not present

## 2021-02-03 DIAGNOSIS — M4726 Other spondylosis with radiculopathy, lumbar region: Secondary | ICD-10-CM | POA: Diagnosis not present

## 2021-02-03 DIAGNOSIS — R41841 Cognitive communication deficit: Secondary | ICD-10-CM | POA: Diagnosis not present

## 2021-02-03 DIAGNOSIS — I480 Paroxysmal atrial fibrillation: Secondary | ICD-10-CM | POA: Diagnosis not present

## 2021-02-03 DIAGNOSIS — E559 Vitamin D deficiency, unspecified: Secondary | ICD-10-CM | POA: Diagnosis not present

## 2021-02-03 DIAGNOSIS — Z741 Need for assistance with personal care: Secondary | ICD-10-CM | POA: Diagnosis not present

## 2021-02-03 DIAGNOSIS — I10 Essential (primary) hypertension: Secondary | ICD-10-CM | POA: Diagnosis not present

## 2021-02-03 DIAGNOSIS — M48061 Spinal stenosis, lumbar region without neurogenic claudication: Secondary | ICD-10-CM | POA: Diagnosis not present

## 2021-02-03 DIAGNOSIS — M6281 Muscle weakness (generalized): Secondary | ICD-10-CM | POA: Diagnosis not present

## 2021-02-03 DIAGNOSIS — Z743 Need for continuous supervision: Secondary | ICD-10-CM | POA: Diagnosis not present

## 2021-02-03 DIAGNOSIS — Z981 Arthrodesis status: Secondary | ICD-10-CM | POA: Diagnosis not present

## 2021-02-03 DIAGNOSIS — R531 Weakness: Secondary | ICD-10-CM | POA: Diagnosis not present

## 2021-02-03 DIAGNOSIS — R262 Difficulty in walking, not elsewhere classified: Secondary | ICD-10-CM | POA: Diagnosis not present

## 2021-02-03 DIAGNOSIS — M48062 Spinal stenosis, lumbar region with neurogenic claudication: Secondary | ICD-10-CM | POA: Diagnosis not present

## 2021-02-03 DIAGNOSIS — Z4789 Encounter for other orthopedic aftercare: Secondary | ICD-10-CM | POA: Diagnosis not present

## 2021-02-03 DIAGNOSIS — M5116 Intervertebral disc disorders with radiculopathy, lumbar region: Secondary | ICD-10-CM | POA: Diagnosis not present

## 2021-02-03 DIAGNOSIS — E785 Hyperlipidemia, unspecified: Secondary | ICD-10-CM | POA: Diagnosis not present

## 2021-02-03 DIAGNOSIS — Z20822 Contact with and (suspected) exposure to covid-19: Secondary | ICD-10-CM | POA: Diagnosis not present

## 2021-02-03 DIAGNOSIS — K08109 Complete loss of teeth, unspecified cause, unspecified class: Secondary | ICD-10-CM | POA: Diagnosis not present

## 2021-02-03 DIAGNOSIS — F329 Major depressive disorder, single episode, unspecified: Secondary | ICD-10-CM | POA: Diagnosis not present

## 2021-02-03 HISTORY — DX: Family history of other specified conditions: Z84.89

## 2021-02-03 HISTORY — DX: Paroxysmal atrial fibrillation: I48.0

## 2021-02-03 LAB — CBC
HCT: 39 % (ref 36.0–46.0)
Hemoglobin: 12.4 g/dL (ref 12.0–15.0)
MCH: 29.4 pg (ref 26.0–34.0)
MCHC: 31.8 g/dL (ref 30.0–36.0)
MCV: 92.4 fL (ref 80.0–100.0)
Platelets: 303 10*3/uL (ref 150–400)
RBC: 4.22 MIL/uL (ref 3.87–5.11)
RDW: 12.9 % (ref 11.5–15.5)
WBC: 7.6 10*3/uL (ref 4.0–10.5)
nRBC: 0 % (ref 0.0–0.2)

## 2021-02-03 LAB — BASIC METABOLIC PANEL
Anion gap: 9 (ref 5–15)
BUN: 10 mg/dL (ref 8–23)
CO2: 27 mmol/L (ref 22–32)
Calcium: 9.3 mg/dL (ref 8.9–10.3)
Chloride: 104 mmol/L (ref 98–111)
Creatinine, Ser: 0.78 mg/dL (ref 0.44–1.00)
GFR, Estimated: >60 mL/min (ref >60)
Glucose, Bld: 101 mg/dL — ABNORMAL HIGH (ref 70–99)
Potassium: 3.9 mmol/L (ref 3.5–5.1)
Sodium: 140 mmol/L (ref 135–145)

## 2021-02-03 LAB — GLUCOSE, CAPILLARY: Glucose-Capillary: 102 mg/dL — ABNORMAL HIGH (ref 70–99)

## 2021-02-03 LAB — SURGICAL PCR SCREEN
MRSA, PCR: NEGATIVE
Staphylococcus aureus: NEGATIVE

## 2021-02-03 LAB — SARS CORONAVIRUS 2 (TAT 6-24 HRS): SARS Coronavirus 2: NEGATIVE

## 2021-02-03 NOTE — Progress Notes (Addendum)
PCP - Dr. Loyal Buba Cardiologist - Dr. Adam Phenix  Device Orders - Loop recorder Rep Notified -   Chest x-ray - Not indicated EKG - 10/12/20 Stress Test - 04/09/01 ECHO - 10/13/20 Cardiac Cath - 12/31/08  Sleep Study - No  DM - Prediabetic CBG at PAT appt 102  Blood Thinner Instructions: Eliquis to hold three days prior to surger  COVID TEST- 02/03/21  Anesthesia review: Yes cardiac history  Patient denies shortness of breath, fever, cough and chest pain at PAT appointment   All instructions explained to the patient, with a verbal understanding of the material. Patient agrees to go over the instructions while at home for a better understanding. Patient also instructed to wear a mask in public after being tested for COVID-19. The opportunity to ask questions was provided.

## 2021-02-03 NOTE — Anesthesia Preprocedure Evaluation (Addendum)
Anesthesia Evaluation  Patient identified by MRN, date of birth, ID band Patient awake    Reviewed: Allergy & Precautions, NPO status , Patient's Chart, lab work & pertinent test results  Airway Mallampati: II  TM Distance: >3 FB Neck ROM: Full    Dental  (+) Edentulous Upper, Edentulous Lower   Pulmonary Current Smoker and Patient abstained from smoking.,    breath sounds clear to auscultation       Cardiovascular hypertension,  Rhythm:Regular  Regular rhythm with extra systoles   Neuro/Psych    GI/Hepatic   Endo/Other    Renal/GU      Musculoskeletal   Abdominal   Peds  Hematology   Anesthesia Other Findings   Reproductive/Obstetrics                            Anesthesia Physical Anesthesia Plan  ASA: 3  Anesthesia Plan: General   Post-op Pain Management:    Induction: Intravenous  PONV Risk Score and Plan: Ondansetron and Dexamethasone  Airway Management Planned: Oral ETT  Additional Equipment:   Intra-op Plan:   Post-operative Plan: Extubation in OR  Informed Consent: I have reviewed the patients History and Physical, chart, labs and discussed the procedure including the risks, benefits and alternatives for the proposed anesthesia with the patient or authorized representative who has indicated his/her understanding and acceptance.     Dental advisory given  Plan Discussed with: CRNA and Anesthesiologist  Anesthesia Plan Comments: (PAT note written 02/03/2021 by Myra Gianotti, PA-C. )       Anesthesia Quick Evaluation

## 2021-02-03 NOTE — Progress Notes (Signed)
Anesthesia Chart Review:  Case: [id number] Date/Time: 02/06/21 0715   Procedure: Lumbar 3-4 Lumbar 4-5 Laminectomy and foraminotomy (Back) - 3C/RM 21   Anesthesia type: General   Pre-op diagnosis: Spinal stenosis of lumbar region with neurogenic claudication   Location: [facility] OR ROOM 21 / [place] [facility]   Surgeons: Kristeen Miss, MD       DISCUSSION: Patient is an 80 year old female scheduled for the above procedure.  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 Reported agitation/restless after her 2008 TKA. Loop recorder implant on 01/02/18. + COVID-19 10/12/20.   Preoperative cardiology input outlined by Kathyrn Drown, NP: "Given past medical history and time since last visit, based on ACC/AHA guidelines, [name] Douglas would be at higher CV risk for the planned procedure given hx of multiple CVA's and atrial fibrillation. She was last seen by Dr. Caryl Comes 08/2020 and was without CV symptoms. Most recent issues concern with back pain... CHA2DS2-VASc Score = 8  This indicates a 10.8% annual risk of stroke." Dr. Caryl Comes gave permission to hold Eliquis for 3 days prior to surgery.    Preoperative COVID-19 test in process from 02/03/21. Anesthesia team to evaluate on the day of surgery.    VS: BP (!) 164/64   Pulse 80   Temp 36.8 C (Oral)   Ht 5\' 4"  (1.626 m)   Wt 98.1 kg   SpO2 95%   BMI 37.11 kg/m   PROVIDERS: Lavera Guise, MD is PCP  Virl Axe, MD is EP. Last visit 09/13/20. S/p loop recorder in 2019 for cryptogenic CVA. Had PAF on monitor detected 10/2019. Plavix discontinued and started on Eliquis.  Milinda Pointer, MD is Pain Management provider Gurney Maxin, MD is neurologist Terald Sleeper)   LABS: Labs reviewed: Acceptable for surgery. A1c in process, was 6.5% 10/13/20.  (all labs ordered are  listed, but only abnormal results are displayed)  Labs Reviewed  BASIC METABOLIC PANEL - Abnormal; Notable for the following components:      Result Value   Glucose, Bld 101 (*)    All other components within normal limits  GLUCOSE, CAPILLARY - Abnormal; Notable for the following components:   Glucose-Capillary 102 (*)    All other components within normal limits  SURGICAL PCR SCREEN  SARS CORONAVIRUS 2 (TAT 6-24 HRS)  CBC  HEMOGLOBIN A1C    IMAGES: MRI Brain 10/13/20: IMPRESSION: 1. No acute intracranial abnormality. 2. Old right parietal infarct and findings of chronic small vessel disease.   1V CXR 10/12/20: FINDINGS: Heart size upper normal. Negative for heart failure. Lungs are clear without infiltrate or effusion. IMPRESSION: No active disease.    EKG: 10/12/20: Sinus rhythm Low voltage, precordial leads Nonspecific T abnormalities, lateral leads   CV: Echo 10/13/20: IMPRESSIONS   1. Left ventricular ejection fraction, by estimation, is 60 to 65%. The  left ventricle has normal function. The left ventricle has no regional  wall motion abnormalities. Left ventricular diastolic parameters are  indeterminate.   2. Right ventricular systolic function is normal. The right ventricular  size is normal. There is mildly elevated pulmonary artery systolic  pressure. The estimated right ventricular systolic pressure is 62.3 mmHg.    Carotid US 07/15/20: IMPRESSION: 1. Right carotid artery system: Less than 50% stenosis secondary to moderate multifocal atherosclerotic plaque formation. 2. Left carotid artery system: Less than 50% stenosis secondary to  moderate multifocal atherosclerotic plaque formation. 3. Pulsus bisferiens waveform visualized in the bilateral carotid arteries as could be seen with aortic valvulopathy. 4.  Vertebral artery system: Patent with antegrade flow bilaterally.   Cardiac event monitor 09/04/16: Study Highlights Poor compliance with monitor  (only wore monitor for 4 days) Sinus rhythm Episodes of sinus tachycardia are noted No atrial fibrillation observed No sustained arrhythmias   She had a prior cath ~ 2007 (11/05/05?), although I do not see report in [facility].    Past Medical History:  Diagnosis Date   Anxiety    Arthritis    "shoulders" (08/20/2014)   Asthma    Chronic lower back pain    Complication of anesthesia    "agitated & restless after knee replacement in 2008"   COPD (chronic obstructive pulmonary disease) (HCC)    DDD (degenerative disc disease), lumbar    Depression    Diverticulosis    Family history of adverse reaction to anesthesia    GERD (gastroesophageal reflux disease)    High cholesterol    Hypertension    Hypothyroidism    Memory loss    mild   On home oxygen therapy    "2L at night" (08/20/2014)   PAF (paroxysmal atrial fibrillation) (HCC)    Peripheral vascular disease (HCC)    Pre-diabetes    Shortness of breath dyspnea    with exertion   Stroke ([place])    mild, no deficits    Past Surgical History:  Procedure Laterality Date   CARDIAC CATHETERIZATION  ~ 2007   EXCISIONAL HEMORRHOIDECTOMY  1960's   EYE SURGERY Bilateral    Cataract extraction with IOL   KNEE ARTHROSCOPY Left X 2 <2008   KNEE ARTHROSCOPY WITH SUBCHONDROPLASTY Right 04/19/2016   Procedure: KNEE ARTHROSCOPY WITH SUBCHONDROPLASTY, PARTIAL MENISCECTOMY;  Surgeon: Hessie Knows, MD;  Location: [facility];  Service: Orthopedics;  Laterality: Right;   LOOP RECORDER INSERTION N/A 01/02/2018   Procedure: LOOP RECORDER INSERTION;  Surgeon: Deboraha Sprang, MD;  Location: [facility];  Service: Cardiovascular;  Laterality: N/A;   SHOULDER OPEN ROTATOR CUFF REPAIR Right 2001   TONSILLECTOMY  ~ [place] ARTHROPLASTY Left 2008   TOTAL KNEE ARTHROPLASTY Right 07/03/2016   Procedure: TOTAL KNEE ARTHROPLASTY;  Surgeon: Hessie Knows, MD;  Location: [facility];  Service: Orthopedics;  Laterality: Right;   TUBAL LIGATION   1973   VAGINAL HYSTERECTOMY  1974    MEDICATIONS:  albuterol (VENTOLIN HFA) 108 (90 Base) MCG/ACT inhaler   apixaban (ELIQUIS) 5 MG TABS tablet   Aspirin-Acetaminophen-Caffeine (GOODY HEADACHE PO)   atorvastatin (LIPITOR) 80 MG tablet   Cyanocobalamin (VITAMIN B-12) 5000 MCG SUBL   cyclobenzaprine (FLEXERIL) 5 MG tablet   docusate sodium (COLACE) 100 MG capsule   donepezil (ARICEPT) 5 MG tablet   gabapentin (NEURONTIN) 100 MG capsule   levothyroxine (SYNTHROID) 75 MCG tablet   losartan (COZAAR) 25 MG tablet   methylPREDNISolone (MEDROL DOSEPAK) 4 MG TBPK tablet   metoprolol succinate (TOPROL-XL) 50 MG 24 hr tablet   oxyCODONE (ROXICODONE) 5 MG immediate release tablet   traMADol (ULTRAM) 50 MG tablet   venlafaxine XR (EFFEXOR-XR) 75 MG 24 hr capsule   Vitamin D, Ergocalciferol, (DRISDOL) 1.25 MG (50000 UNIT) CAPS capsule   No current facility-administered medications for this encounter.    Myra Gianotti, PA-C Surgical Short Stay/Anesthesiology [facility] Phone [phone] [facility] Phone [phone] 02/03/2021 5:42 PM

## 2021-02-04 LAB — HEMOGLOBIN A1C
Hgb A1c MFr Bld: 6.2 % — ABNORMAL HIGH (ref 4.8–5.6)
Mean Plasma Glucose: 131 mg/dL

## 2021-02-06 ENCOUNTER — Ambulatory Visit (HOSPITAL_COMMUNITY): Payer: Medicare Other

## 2021-02-06 ENCOUNTER — Ambulatory Visit (HOSPITAL_COMMUNITY): Payer: Medicare Other | Admitting: Anesthesiology

## 2021-02-06 ENCOUNTER — Encounter (HOSPITAL_COMMUNITY)
Admission: RE | Disposition: A | Discharge status: Skilled Nursing Facility | Payer: Self-pay | Source: Home / Self Care | Attending: Neurological Surgery

## 2021-02-06 ENCOUNTER — Encounter (HOSPITAL_COMMUNITY): Payer: Self-pay | Admitting: Neurological Surgery

## 2021-02-06 ENCOUNTER — Ambulatory Visit (HOSPITAL_COMMUNITY): Payer: Medicare Other | Admitting: Vascular Surgery

## 2021-02-06 ENCOUNTER — Inpatient Hospital Stay (HOSPITAL_COMMUNITY)
Admission: RE | Admit: 2021-02-06 | Discharge: 2021-02-08 | DRG: 520 | Disposition: A | Discharge status: Skilled Nursing Facility | Payer: Medicare Other | Attending: Neurological Surgery | Admitting: Neurological Surgery

## 2021-02-06 ENCOUNTER — Other Ambulatory Visit: Payer: Self-pay

## 2021-02-06 DIAGNOSIS — I480 Paroxysmal atrial fibrillation: Secondary | ICD-10-CM | POA: Diagnosis not present

## 2021-02-06 DIAGNOSIS — R531 Weakness: Secondary | ICD-10-CM | POA: Diagnosis not present

## 2021-02-06 DIAGNOSIS — M48061 Spinal stenosis, lumbar region without neurogenic claudication: Secondary | ICD-10-CM | POA: Diagnosis not present

## 2021-02-06 DIAGNOSIS — M48062 Spinal stenosis, lumbar region with neurogenic claudication: Secondary | ICD-10-CM | POA: Diagnosis present

## 2021-02-06 DIAGNOSIS — Z743 Need for continuous supervision: Secondary | ICD-10-CM | POA: Diagnosis not present

## 2021-02-06 DIAGNOSIS — I10 Essential (primary) hypertension: Secondary | ICD-10-CM | POA: Diagnosis present

## 2021-02-06 DIAGNOSIS — Z8673 Personal history of transient ischemic attack (TIA), and cerebral infarction without residual deficits: Secondary | ICD-10-CM

## 2021-02-06 DIAGNOSIS — M4726 Other spondylosis with radiculopathy, lumbar region: Secondary | ICD-10-CM | POA: Diagnosis present

## 2021-02-06 DIAGNOSIS — Z20822 Contact with and (suspected) exposure to covid-19: Secondary | ICD-10-CM | POA: Diagnosis present

## 2021-02-06 DIAGNOSIS — M6281 Muscle weakness (generalized): Secondary | ICD-10-CM | POA: Diagnosis not present

## 2021-02-06 DIAGNOSIS — K08109 Complete loss of teeth, unspecified cause, unspecified class: Secondary | ICD-10-CM | POA: Diagnosis present

## 2021-02-06 DIAGNOSIS — E559 Vitamin D deficiency, unspecified: Secondary | ICD-10-CM | POA: Diagnosis not present

## 2021-02-06 DIAGNOSIS — M5116 Intervertebral disc disorders with radiculopathy, lumbar region: Secondary | ICD-10-CM | POA: Diagnosis present

## 2021-02-06 DIAGNOSIS — E039 Hypothyroidism, unspecified: Secondary | ICD-10-CM | POA: Diagnosis not present

## 2021-02-06 DIAGNOSIS — Z741 Need for assistance with personal care: Secondary | ICD-10-CM | POA: Diagnosis not present

## 2021-02-06 DIAGNOSIS — R41841 Cognitive communication deficit: Secondary | ICD-10-CM | POA: Diagnosis not present

## 2021-02-06 DIAGNOSIS — Z4789 Encounter for other orthopedic aftercare: Secondary | ICD-10-CM | POA: Diagnosis not present

## 2021-02-06 DIAGNOSIS — R278 Other lack of coordination: Secondary | ICD-10-CM | POA: Diagnosis not present

## 2021-02-06 DIAGNOSIS — F0150 Vascular dementia without behavioral disturbance: Secondary | ICD-10-CM | POA: Diagnosis not present

## 2021-02-06 DIAGNOSIS — E785 Hyperlipidemia, unspecified: Secondary | ICD-10-CM | POA: Diagnosis not present

## 2021-02-06 DIAGNOSIS — Z981 Arthrodesis status: Secondary | ICD-10-CM | POA: Diagnosis not present

## 2021-02-06 DIAGNOSIS — R262 Difficulty in walking, not elsewhere classified: Secondary | ICD-10-CM | POA: Diagnosis not present

## 2021-02-06 DIAGNOSIS — M1990 Unspecified osteoarthritis, unspecified site: Secondary | ICD-10-CM | POA: Diagnosis not present

## 2021-02-06 DIAGNOSIS — Z419 Encounter for procedure for purposes other than remedying health state, unspecified: Secondary | ICD-10-CM

## 2021-02-06 DIAGNOSIS — R5381 Other malaise: Secondary | ICD-10-CM | POA: Diagnosis not present

## 2021-02-06 DIAGNOSIS — G44009 Cluster headache syndrome, unspecified, not intractable: Secondary | ICD-10-CM | POA: Diagnosis not present

## 2021-02-06 DIAGNOSIS — F329 Major depressive disorder, single episode, unspecified: Secondary | ICD-10-CM | POA: Diagnosis not present

## 2021-02-06 HISTORY — PX: LUMBAR LAMINECTOMY/DECOMPRESSION MICRODISCECTOMY: SHX5026

## 2021-02-06 LAB — GLUCOSE, CAPILLARY: Glucose-Capillary: 125 mg/dL — ABNORMAL HIGH (ref 70–99)

## 2021-02-06 SURGERY — LUMBAR LAMINECTOMY/DECOMPRESSION MICRODISCECTOMY 2 LEVELS
Anesthesia: General | Site: Spine Lumbar

## 2021-02-06 MED ORDER — CHLORHEXIDINE GLUCONATE 0.12 % MT SOLN
OROMUCOSAL | Status: AC
  Filled 2021-02-06: qty 15

## 2021-02-06 MED ORDER — ONDANSETRON HCL 4 MG/2ML IJ SOLN
INTRAMUSCULAR | Status: DC | PRN
  Administered 2021-02-06: 4 mg via INTRAVENOUS

## 2021-02-06 MED ORDER — LIDOCAINE-EPINEPHRINE 1 %-1:100000 IJ SOLN
INTRAMUSCULAR | Status: DC | PRN
  Administered 2021-02-06: 5 mL

## 2021-02-06 MED ORDER — LOSARTAN POTASSIUM 50 MG PO TABS
25.0000 mg | ORAL_TABLET | Freq: Every day | ORAL | Status: DC
  Administered 2021-02-07 – 2021-02-08 (×2): 25 mg via ORAL
  Filled 2021-02-06 (×2): qty 1

## 2021-02-06 MED ORDER — PHENOL 1.4 % MT LIQD: 1.0000 | OROMUCOSAL | Status: DC | PRN

## 2021-02-06 MED ORDER — ACETAMINOPHEN 325 MG PO TABS
650.0000 mg | ORAL_TABLET | ORAL | Status: DC | PRN
  Administered 2021-02-06 (×2): 650 mg via ORAL
  Filled 2021-02-06 (×2): qty 2

## 2021-02-06 MED ORDER — THROMBIN 5000 UNITS EX SOLR
CUTANEOUS | Status: AC
  Filled 2021-02-06: qty 5000

## 2021-02-06 MED ORDER — ATORVASTATIN CALCIUM 80 MG PO TABS
80.0000 mg | ORAL_TABLET | Freq: Every day | ORAL | Status: DC
  Administered 2021-02-06 – 2021-02-07 (×2): 80 mg via ORAL
  Filled 2021-02-06 (×2): qty 1

## 2021-02-06 MED ORDER — THROMBIN 5000 UNITS EX SOLR
CUTANEOUS | Status: AC
  Filled 2021-02-06: qty 10000

## 2021-02-06 MED ORDER — POLYETHYLENE GLYCOL 3350 17 G PO PACK: 17.0000 g | PACK | Freq: Every day | ORAL | Status: DC | PRN

## 2021-02-06 MED ORDER — SODIUM CHLORIDE 0.9% FLUSH
3.0000 mL | Freq: Two times a day (BID) | INTRAVENOUS | Status: DC
  Administered 2021-02-07 – 2021-02-08 (×3): 3 mL via INTRAVENOUS

## 2021-02-06 MED ORDER — ONDANSETRON HCL 4 MG PO TABS: 4.0000 mg | ORAL_TABLET | Freq: Four times a day (QID) | ORAL | Status: DC | PRN

## 2021-02-06 MED ORDER — ONDANSETRON HCL 4 MG/2ML IJ SOLN: 4.0000 mg | Freq: Four times a day (QID) | INTRAMUSCULAR | Status: DC | PRN

## 2021-02-06 MED ORDER — EPHEDRINE SULFATE-NACL 50-0.9 MG/10ML-% IV SOSY
PREFILLED_SYRINGE | INTRAVENOUS | Status: DC | PRN
  Administered 2021-02-06: 10 mg via INTRAVENOUS
  Administered 2021-02-06 (×3): 5 mg via INTRAVENOUS

## 2021-02-06 MED ORDER — HEMOSTATIC AGENTS (NO CHARGE) OPTIME
TOPICAL | Status: DC | PRN
  Administered 2021-02-06: 1 via TOPICAL

## 2021-02-06 MED ORDER — CEFAZOLIN SODIUM-DEXTROSE 2-3 GM-%(50ML) IV SOLR
INTRAVENOUS | Status: DC | PRN
  Administered 2021-02-06: 2 g via INTRAVENOUS

## 2021-02-06 MED ORDER — SENNA 8.6 MG PO TABS
1.0000 | ORAL_TABLET | Freq: Two times a day (BID) | ORAL | Status: DC
  Administered 2021-02-06 – 2021-02-08 (×4): 8.6 mg via ORAL
  Filled 2021-02-06 (×4): qty 1

## 2021-02-06 MED ORDER — METOPROLOL SUCCINATE ER 100 MG PO TB24
100.0000 mg | ORAL_TABLET | Freq: Every day | ORAL | Status: DC
  Administered 2021-02-07 – 2021-02-08 (×2): 100 mg via ORAL
  Filled 2021-02-06 (×2): qty 1

## 2021-02-06 MED ORDER — THROMBIN 5000 UNITS EX SOLR: OROMUCOSAL | Status: DC | PRN

## 2021-02-06 MED ORDER — ROCURONIUM BROMIDE 10 MG/ML (PF) SYRINGE
PREFILLED_SYRINGE | INTRAVENOUS | Status: DC | PRN
  Administered 2021-02-06: 20 mg via INTRAVENOUS
  Administered 2021-02-06 (×2): 50 mg via INTRAVENOUS

## 2021-02-06 MED ORDER — GABAPENTIN 100 MG PO CAPS
200.0000 mg | ORAL_CAPSULE | Freq: Three times a day (TID) | ORAL | Status: DC
  Administered 2021-02-06 – 2021-02-08 (×6): 200 mg via ORAL
  Filled 2021-02-06 (×6): qty 2

## 2021-02-06 MED ORDER — THROMBIN 5000 UNITS EX SOLR
CUTANEOUS | Status: DC | PRN
  Administered 2021-02-06 (×2): 5000 [IU] via TOPICAL

## 2021-02-06 MED ORDER — CEFAZOLIN SODIUM-DEXTROSE 2-4 GM/100ML-% IV SOLN
2.0000 g | Freq: Three times a day (TID) | INTRAVENOUS | Status: AC
  Administered 2021-02-06 – 2021-02-07 (×2): 2 g via INTRAVENOUS
  Filled 2021-02-06 (×2): qty 100

## 2021-02-06 MED ORDER — FENTANYL CITRATE (PF) 100 MCG/2ML IJ SOLN: 25.0000 ug | INTRAMUSCULAR | Status: DC | PRN

## 2021-02-06 MED ORDER — DONEPEZIL HCL 10 MG PO TABS
5.0000 mg | ORAL_TABLET | Freq: Every day | ORAL | Status: DC
  Administered 2021-02-06 – 2021-02-07 (×2): 5 mg via ORAL
  Filled 2021-02-06 (×2): qty 1

## 2021-02-06 MED ORDER — FENTANYL CITRATE (PF) 250 MCG/5ML IJ SOLN
INTRAMUSCULAR | Status: AC
  Filled 2021-02-06: qty 5

## 2021-02-06 MED ORDER — SODIUM CHLORIDE 0.9 % IV SOLN: 250.0000 mL | INTRAVENOUS | Status: DC

## 2021-02-06 MED ORDER — OXYCODONE-ACETAMINOPHEN 5-325 MG PO TABS
1.0000 | ORAL_TABLET | ORAL | Status: DC | PRN
  Administered 2021-02-07 – 2021-02-08 (×5): 2 via ORAL
  Filled 2021-02-06 (×5): qty 2

## 2021-02-06 MED ORDER — ONDANSETRON HCL 4 MG/2ML IJ SOLN: 4.0000 mg | Freq: Once | INTRAMUSCULAR | Status: DC | PRN

## 2021-02-06 MED ORDER — DOCUSATE SODIUM 100 MG PO CAPS
100.0000 mg | ORAL_CAPSULE | Freq: Two times a day (BID) | ORAL | Status: DC
  Administered 2021-02-06 – 2021-02-08 (×4): 100 mg via ORAL
  Filled 2021-02-06 (×4): qty 1

## 2021-02-06 MED ORDER — LIDOCAINE-EPINEPHRINE 1 %-1:100000 IJ SOLN
INTRAMUSCULAR | Status: AC
  Filled 2021-02-06: qty 1

## 2021-02-06 MED ORDER — CHLORHEXIDINE GLUCONATE 0.12 % MT SOLN
15.0000 mL | Freq: Once | OROMUCOSAL | Status: AC
  Administered 2021-02-06: 15 mL via OROMUCOSAL

## 2021-02-06 MED ORDER — ALUM & MAG HYDROXIDE-SIMETH 200-200-20 MG/5ML PO SUSP: 30.0000 mL | Freq: Four times a day (QID) | ORAL | Status: DC | PRN

## 2021-02-06 MED ORDER — BISACODYL 10 MG RE SUPP: 10.0000 mg | Freq: Every day | RECTAL | Status: DC | PRN

## 2021-02-06 MED ORDER — ORAL CARE MOUTH RINSE: 15.0000 mL | Freq: Once | OROMUCOSAL | Status: AC

## 2021-02-06 MED ORDER — BUPIVACAINE HCL (PF) 0.5 % IJ SOLN
INTRAMUSCULAR | Status: AC
  Filled 2021-02-06: qty 30

## 2021-02-06 MED ORDER — SODIUM CHLORIDE 0.9% FLUSH: 3.0000 mL | INTRAVENOUS | Status: DC | PRN

## 2021-02-06 MED ORDER — METHOCARBAMOL 1000 MG/10ML IJ SOLN
500.0000 mg | Freq: Four times a day (QID) | INTRAVENOUS | Status: DC | PRN
  Filled 2021-02-06: qty 5

## 2021-02-06 MED ORDER — LACTATED RINGERS IV SOLN: INTRAVENOUS | Status: DC

## 2021-02-06 MED ORDER — OXYCODONE HCL 5 MG PO TABS: 5.0000 mg | ORAL_TABLET | Freq: Once | ORAL | Status: DC | PRN

## 2021-02-06 MED ORDER — LEVOTHYROXINE SODIUM 75 MCG PO TABS
75.0000 ug | ORAL_TABLET | Freq: Every day | ORAL | Status: DC
  Administered 2021-02-07 – 2021-02-08 (×2): 75 ug via ORAL
  Filled 2021-02-06 (×2): qty 1

## 2021-02-06 MED ORDER — METHOCARBAMOL 500 MG PO TABS
500.0000 mg | ORAL_TABLET | Freq: Four times a day (QID) | ORAL | Status: DC | PRN
  Administered 2021-02-07: 500 mg via ORAL
  Filled 2021-02-06: qty 1

## 2021-02-06 MED ORDER — PROPOFOL 10 MG/ML IV BOLUS
INTRAVENOUS | Status: AC
  Filled 2021-02-06: qty 20

## 2021-02-06 MED ORDER — BUPIVACAINE HCL (PF) 0.5 % IJ SOLN
INTRAMUSCULAR | Status: DC | PRN
  Administered 2021-02-06: 5 mL
  Administered 2021-02-06: 25 mL

## 2021-02-06 MED ORDER — DEXAMETHASONE SODIUM PHOSPHATE 10 MG/ML IJ SOLN
INTRAMUSCULAR | Status: DC | PRN
  Administered 2021-02-06: 10 mg via INTRAVENOUS

## 2021-02-06 MED ORDER — CYCLOBENZAPRINE HCL 10 MG PO TABS
5.0000 mg | ORAL_TABLET | Freq: Every day | ORAL | Status: DC
  Administered 2021-02-06 – 2021-02-07 (×2): 5 mg via ORAL
  Filled 2021-02-06 (×2): qty 1

## 2021-02-06 MED ORDER — SUGAMMADEX SODIUM 200 MG/2ML IV SOLN
INTRAVENOUS | Status: DC | PRN
  Administered 2021-02-06: 200 mg via INTRAVENOUS

## 2021-02-06 MED ORDER — 0.9 % SODIUM CHLORIDE (POUR BTL) OPTIME
TOPICAL | Status: DC | PRN
  Administered 2021-02-06: 1000 mL

## 2021-02-06 MED ORDER — FENTANYL CITRATE (PF) 250 MCG/5ML IJ SOLN
INTRAMUSCULAR | Status: DC | PRN
  Administered 2021-02-06: 25 ug via INTRAVENOUS
  Administered 2021-02-06: 100 ug via INTRAVENOUS
  Administered 2021-02-06: 25 ug via INTRAVENOUS

## 2021-02-06 MED ORDER — FLEET ENEMA 7-19 GM/118ML RE ENEM: 1.0000 | ENEMA | Freq: Once | RECTAL | Status: DC | PRN

## 2021-02-06 MED ORDER — PHENYLEPHRINE HCL-NACL 10-0.9 MG/250ML-% IV SOLN
INTRAVENOUS | Status: DC | PRN
  Administered 2021-02-06: 20 ug/min via INTRAVENOUS

## 2021-02-06 MED ORDER — MENTHOL 3 MG MT LOZG: 1.0000 | LOZENGE | OROMUCOSAL | Status: DC | PRN

## 2021-02-06 MED ORDER — MORPHINE SULFATE (PF) 2 MG/ML IV SOLN
2.0000 mg | INTRAVENOUS | Status: DC | PRN
  Administered 2021-02-07: 2 mg via INTRAVENOUS
  Filled 2021-02-06: qty 1

## 2021-02-06 MED ORDER — ACETAMINOPHEN 650 MG RE SUPP: 650.0000 mg | RECTAL | Status: DC | PRN

## 2021-02-06 MED ORDER — PROPOFOL 10 MG/ML IV BOLUS
INTRAVENOUS | Status: DC | PRN
  Administered 2021-02-06: 150 mg via INTRAVENOUS

## 2021-02-06 MED ORDER — DOCUSATE SODIUM 100 MG PO CAPS: 100.0000 mg | ORAL_CAPSULE | Freq: Every day | ORAL | Status: DC | PRN

## 2021-02-06 MED ORDER — VENLAFAXINE HCL ER 75 MG PO CP24
150.0000 mg | ORAL_CAPSULE | Freq: Every day | ORAL | Status: DC
  Administered 2021-02-07 – 2021-02-08 (×2): 150 mg via ORAL
  Filled 2021-02-06 (×2): qty 2

## 2021-02-06 MED ORDER — OXYCODONE HCL 5 MG/5ML PO SOLN: 5.0000 mg | Freq: Once | ORAL | Status: DC | PRN

## 2021-02-06 SURGICAL SUPPLY — 40 items
BAND RUBBER #18 3X1/16 STRL (MISCELLANEOUS) ×4 IMPLANT
BUR ACORN 6.0 (BURR) IMPLANT
BUR MATCHSTICK NEURO 3.0 LAGG (BURR) ×2 IMPLANT
CANISTER SUCT 3000ML PPV (MISCELLANEOUS) ×2 IMPLANT
DERMABOND ADVANCED (GAUZE/BANDAGES/DRESSINGS) ×1
DERMABOND ADVANCED .7 DNX12 (GAUZE/BANDAGES/DRESSINGS) ×1 IMPLANT
DRAPE HALF SHEET 40X57 (DRAPES) IMPLANT
DRAPE LAPAROTOMY 100X72X124 (DRAPES) ×2 IMPLANT
DRAPE MICROSCOPE LEICA (MISCELLANEOUS) ×2 IMPLANT
DRSG OPSITE POSTOP 4X8 (GAUZE/BANDAGES/DRESSINGS) ×2 IMPLANT
DURAPREP 26ML APPLICATOR (WOUND CARE) ×2 IMPLANT
ELECT REM PT RETURN 9FT ADLT (ELECTROSURGICAL) ×2 IMPLANT
ELECTRODE REM PT RTRN 9FT ADLT (ELECTROSURGICAL) ×1 IMPLANT
GAUZE 4X4 16PLY RFD (DISPOSABLE) IMPLANT
GAUZE SPONGE 4X4 12PLY STRL (GAUZE/BANDAGES/DRESSINGS) ×2 IMPLANT
GLOVE BIOGEL PI IND STRL 8.5 (GLOVE) ×2 IMPLANT
GLOVE BIOGEL PI INDICATOR 8.5 (GLOVE) ×2
GLOVE ECLIPSE 8.5 STRL (GLOVE) ×4 IMPLANT
GOWN STRL REUS W/ TWL LRG LVL3 (GOWN DISPOSABLE) ×1 IMPLANT
GOWN STRL REUS W/ TWL XL LVL3 (GOWN DISPOSABLE) ×2 IMPLANT
GOWN STRL REUS W/TWL 2XL LVL3 (GOWN DISPOSABLE) ×2 IMPLANT
GOWN STRL REUS W/TWL LRG LVL3 (GOWN DISPOSABLE) ×1
GOWN STRL REUS W/TWL XL LVL3 (GOWN DISPOSABLE) ×2
HEMOSTAT POWDER KIT SURGIFOAM (HEMOSTASIS) ×2 IMPLANT
KIT BASIN OR (CUSTOM PROCEDURE TRAY) ×2 IMPLANT
KIT TURNOVER KIT B (KITS) ×2 IMPLANT
NEEDLE HYPO 22GX1.5 SAFETY (NEEDLE) ×2 IMPLANT
NEEDLE SPNL 20GX3.5 QUINCKE YW (NEEDLE) ×2 IMPLANT
NS IRRIG 1000ML POUR BTL (IV SOLUTION) ×2 IMPLANT
PACK LAMINECTOMY NEURO (CUSTOM PROCEDURE TRAY) ×2 IMPLANT
PATTIES SURGICAL .5 X1 (DISPOSABLE) ×2 IMPLANT
SPONGE SURGIFOAM ABS GEL SZ50 (HEMOSTASIS) ×2 IMPLANT
SUT VIC AB 1 CT1 18XBRD ANBCTR (SUTURE) ×1 IMPLANT
SUT VIC AB 1 CT1 8-18 (SUTURE) ×1
SUT VIC AB 2-0 CP2 18 (SUTURE) ×2 IMPLANT
SUT VIC AB 3-0 SH 8-18 (SUTURE) ×2 IMPLANT
SUT VIC AB 4-0 RB1 18 (SUTURE) ×2 IMPLANT
TOWEL GREEN STERILE (TOWEL DISPOSABLE) ×2 IMPLANT
TOWEL GREEN STERILE FF (TOWEL DISPOSABLE) ×2 IMPLANT
WATER STERILE IRR 1000ML POUR (IV SOLUTION) ×2 IMPLANT

## 2021-02-06 NOTE — H&P (Signed)
CHIEF COMPLAINT: Back pain, leg weakness, inability to walk.  HISTORY OF PRESENT ILLNESS: [name] is an 80 year old right-handed individual who notes that she has had back pain for more than a year now. About a year ago, she started some workup including an MRI, and a CT scan of the lumbar spine.  She had been seen and treated with some injections, some of which helped minimally, but then subsequently had no effect at all.  She had been seen by a neurosurgical specialist in [place], and surgery had been discussed. In November she suffered a pontine CVA characterized by some hypodensity in the midportion of the pons and this gradually took some time to recover.  She noted that she felt weak all over and had some double vision and visual symptoms that gradually recovered.  Nonetheless, on subsequent followup, her neurosurgical consultant indicated that he would not operate on her back unless it was a life and death situation, and since that time, she notes that her functional status is continuing to deteriorate.  She notes that she gets around the house by just hanging onto walls, but going out anywhere requires the use of a wheelchair.  She can walk short distances only and walking to do any shopping or go outside is virtually impossible.  She notes that she has been able to control her bowel and bladder.  She is seen now for another surgical opinion as suggested by Dr. Marsa Aris, the neurosurgeon who had seen her.  PHYSICAL EXAMINATION: I note that the patient can stand for brief periods of time.  She does hold on with 1 or 2 arms to maintain balance.  She has strength that would be graded at 4/5 in the iliopsoas, the quads, the tibialis anterior and the gastrocs and she does require the use of her upper extremities to stand into a straight position.  She is clinically forward flexed at approximately 15 degrees when she tries to attempt to stand as straight as possible.  IMAGING: Review of the  imaging studies that are available includes an MRI from September of 2021.  There is also plain x-rays and a CT scan that was performed in December of 2021.  The MRI demonstrates that the patient has a high-grade stenosis at L3-4 and L4-5 of the central canal.  There is a mild to moderate stenosis at L2-3 with a small, subligamentous disc herniation behind the vertebral body of L2 on the right side.  The stenosis at L4-L5 is due to a combination of disc bulging and marked facet hypertrophy.  The alignment of her spine in both the coronal and sagittal planes appears to be quite normal.  The patient does have significant atherosclerotic disease that can be appreciated best on the CT scan of the lumbar spine.  The MRI of her brain was reviewed from November, which demonstrates a pontine infarct.  There is atherosclerotic disease in the intracranial vessels and currently the patient is on Eliquis.  OTHER MEDICATIONS: Her other medications include: Vitamin D orally, Venlafaxine, Metoprolol, Losartan, Levothyroxine, Gabapentin, Cyclobenzaprine, and Atorvastatin.  IMPRESSION: The patient has a history of having had a pontine cerebrovascular accident in November of this past year.  She has recovered from that, but now her back and her legs are giving her a substantially reduced quality of life.  I discussed the relative risks of surgery with her and her daughter, who accompanies her, noting that indeed there is increased risk of a stroke with surgery and there is also  the risk of the surgery itself that could lead to other issues such as a recurrent fracture of the vertebrae.  I noted that she had an old fracture of the L1 vertebrae likely due to osteoporosis, and given her age and her mobility status, osteoporosis is certainly underlying the current condition.  Having said that, given the change in the deterioration of her lifestyle, I believe that a decompression at I2-6 and E1-5 is certainly warranted to help  alleviate the worst of her stenotic symptoms.  The surgery typically requires an overnight stay in the hospital, but given the patient's age and independent status, she may require some facilitation in a nursing facility to recover.  She notes that she has been at such a facility after knee replacement surgery and I would hope that perhaps a week stay in a convalescent facility would be of benefit to get her back and more mobile and get her back pain under better control.  Thereafter, she can go back to her independent living situation.  All these things being considered, I believe that she would be off the Eliquis for a period of about 3 days before surgery and we would certainly want to restart this within a day or 2 of the surgery thereafter. We can plan the surgical intervention and all the risks being considered, I hope that we can get her by to get some relief of the worst of her symptoms.

## 2021-02-06 NOTE — Progress Notes (Signed)
Pt arrived to the unit. VS taken and stable. Pt A&O X4 and in no pain. Family at the bedside.   Belongings include:  Clothing  Call bell in reach and bed in lowest position.

## 2021-02-06 NOTE — Op Note (Signed)
Date of surgery: 02/06/2021 Preoperative diagnosis: Lumbar spondylosis and stenosis L3-4 L4-5 with neurogenic claudication, lumbar radiculopathy. Postoperative diagnosis: Same Procedure: Bilateral laminotomies L3-4 and L4-5 discectomy on the right L4-5. Surgeon: Kristeen Miss Anesthesia: General endotracheal Indications: [name] Douglas is an 80 year old individual who has had significant symptoms of neurogenic claudication in addition to lumbar radiculopathy at the current time she ambulates minimally barely able to do her activities of daily living.  She can walk with the use of a walker a maximum distance of about 20 feet.  With this she notes that there is severe pain and weakness such that her legs feel like they are going to collapse.  Is been advised regarding surgical decompression at L3-4 and L4-5 given severe nature of stenosis at these 2 levels.  Procedure: The patient was brought to the operating room placed on table in supine position.  After the smooth induction of general tracheal anesthesia, she was carefully turned prone.  The back was prepped with alcohol DuraPrep after the bony prominences were appropriately padded and protected.  The area was draped sterilely.  A midline incision was created in the lower lumbar spine and the dissection was carried down to the lumbodorsal fascia.  Laminotomy was then started on the left side and this was identified positively as the L4-5 level with a radiograph.  Then the laminotomy was enlarged and thickened redundant yellow ligament was encountered.  This was carefully dissected and the common dural tube was identified.  The severity of the stenosis was noted best at this level with complete tightness of the dural sac.  As the laminotomy was increased in size 1 could feel more room superiorly and inferiorly.  A large protrusion of the disc was encountered at this level.  As lateral recess was clear the path of the L5 nerve root was decompressed and the path  of the L4 nerve root superiorly was checked and noted to be decompressed.  Laminotomy was then completed with hemostasis being achieved from bony edges.  Dissection was then carried superiorly to expose L3-4 and here similar laminotomy was created here there was not such a large protrusion of the disc posteriorly.  The path of the L4 nerve root inferiorly and the L3 nerve root on the superior aspect laminotomies was cleared using some curved Kerrison punches.  Attention was then turned to the right side where a laminotomy was created at L4-5 and completion of this laminotomy yielded good relief of the common dural tube on evaluating the right-sided disc which was noted to be pushing back posteriorly even more severely than it was on the left side there was felt to be a looseness in the ligament and on further probing a fragment of disc presented itself this area was opened and decompressed.  The disc space could not be entered on the space but the epidural space was decompressed of the disc material that was present compromising the path of the L5 nerve root inferiorly.  Once this was decompressed care was taken to make sure that the foramen superiorly was decompressed also.  Then the L3-4 laminal was decompressed using laminotomy and foraminotomies at this level to decompress the L4 nerve root inferiorly and the L3 nerve root superiorly.  Once the decompression was achieved hemostasis in the epidural spaces was achieved carefully I half percent Marcaine was injected around the epidural space at L3-4 and L4-5 bilaterally no CSF leaks were encountered during the decompression.  With good hemostasis being achieved the paraspinous fascia and  muscle was injected with an additional 20 cc of half percent Marcaine and then the lumbodorsal fascia was closed with #1 Vicryl interrupted fashion 2-0 Vicryl was used in the subcutaneous tissues 3-0 Vicryl subcuticularly along with a final closure of 4-0 Vicryl Dermabond was  placed on the skin.  Total blood loss was estimated at 100 cc.

## 2021-02-06 NOTE — Anesthesia Postprocedure Evaluation (Signed)
Anesthesia Post Note  Patient: CORNIE MCCOMBER  Procedure(s) Performed: Lumbar Three-Four, Lumbar Four-Five Laminectomy and Foraminotomy (Spine Lumbar)     Patient location during evaluation: PACU Anesthesia Type: General Level of consciousness: awake and alert Pain management: pain level controlled Vital Signs Assessment: post-procedure vital signs reviewed and stable Respiratory status: spontaneous breathing, nonlabored ventilation, respiratory function stable and patient connected to nasal cannula oxygen Cardiovascular status: blood pressure returned to baseline and stable Postop Assessment: no apparent nausea or vomiting Anesthetic complications: no   No notable events documented.  Last Vitals:  Vitals:   02/06/21 1208 02/06/21 1913  BP: (!) 145/48 (!) 136/54  Pulse: 69 79  Resp: 18   Temp: 36.7 C 36.4 C  SpO2: 95% 95%    Last Pain:  Vitals:   02/06/21 1913  TempSrc: Oral  PainSc:                  Camron Monday COKER

## 2021-02-06 NOTE — Progress Notes (Signed)
Pt arrived to unit per PACU RN Pt had not voided since 0500 this am. Pt was bladder scanned upon arrival to the unit at 1200 Bladder scan showed 0 and 8 mls. Pt bladder scanned again at 1630, scans showed 67 and 26mls. Pt expressing no discomfort at this time.

## 2021-02-06 NOTE — Anesthesia Procedure Notes (Signed)
Procedure Name: Intubation Date/Time: 02/06/2021 7:56 AM Performed by: Thelma Comp, CRNA Pre-anesthesia Checklist: Patient identified, Emergency Drugs available, Suction available and Patient being monitored Patient Re-evaluated:Patient Re-evaluated prior to induction Oxygen Delivery Method: Circle System Utilized Preoxygenation: Pre-oxygenation with 100% oxygen Induction Type: IV induction Ventilation: Mask ventilation without difficulty Laryngoscope Size: Mac and 3 Grade View: Grade I Tube type: Oral Number of attempts: 1 Airway Equipment and Method: Stylet Placement Confirmation: ETT inserted through vocal cords under direct vision, positive ETCO2 and breath sounds checked- equal and bilateral Secured at: 21 cm Tube secured with: Tape Dental Injury: Teeth and Oropharynx as per pre-operative assessment

## 2021-02-06 NOTE — Transfer of Care (Signed)
Immediate Anesthesia Transfer of Care Note  Patient: [name] Douglas  Procedure(s) Performed: Lumbar Three-Four, Lumbar Four-Five Laminectomy and Foraminotomy (Spine Lumbar)  Patient Location: PACU  Anesthesia Type:General  Level of Consciousness: drowsy and patient cooperative  Airway & Oxygen Therapy: Patient Spontanous Breathing and Patient connected to face mask oxygen  Post-op Assessment: Report given to RN and Post -op Vital signs reviewed and stable  Post vital signs: Reviewed and stable  Last Vitals:  Vitals Value Taken Time  BP 164/73 02/06/21 1046  Temp    Pulse 81 02/06/21 1048  Resp 14 02/06/21 1048  SpO2 99 % 02/06/21 1048  Vitals shown include unvalidated device data.  Last Pain:  Vitals:   02/06/21 0602  TempSrc:   PainSc: 0-No pain         Complications: No notable events documented.

## 2021-02-07 ENCOUNTER — Encounter (HOSPITAL_COMMUNITY): Payer: Self-pay | Admitting: Neurological Surgery

## 2021-02-07 LAB — SARS CORONAVIRUS 2 (TAT 6-24 HRS): SARS Coronavirus 2: NEGATIVE

## 2021-02-07 NOTE — Progress Notes (Signed)
Patient ID: [name] Douglas, female   DOB: 1940/08/29, 80 y.o.   MRN: [id number] Vital signs are stable Patient appears to be doing quite well She is ambulating better than preop However she does have significant issues of unsteadiness SNF has been recommended and [facility] the past Hopefully our social workers can help arrange this I am pleased with her early recovery Patient would like to snack however she is prediabetic and was written for carb modified diet I will ask dietitian to visit with her

## 2021-02-07 NOTE — Plan of Care (Signed)
Nutrition Education Note  RD consulted for nutrition education regarding pre-diabetes. Pt with questions about diet order and snacks.  Lab Results  Component Value Date   HGBA1C 6.2 (H) 02/03/2021   Spoke with pt at bedside. Pt reports that when she tries to order certain foods off of the menu, she is not able to with current diet order of Carb Modified. Pt reports that she does have pre-diabetes. Discussed Carb Modified diet order parameters with pt. Pt wondering what she can and can't eat at home.  Pt reports that she really loves sweets and drinks a lot of juice during the day as well as Coke. Discussed with pt the importance of decreasing juice and Coke intake. Pt agreeable to try decreasing consumption of sugar-sweetened beverages but does not like diet sodas. Discussed other low-sugar and sugar-free drink options with pt. Also discussed appropriate snack options as RD noted bag of Cheetos and chocolate candy on pt's bedside table.  RD provided "Carbohydrate Counting for People with Diabetes" handout from the Academy of [employer] and Dietetics. Discussed with pt that not all portions of handout would pertain to her but that it did list appropriate portion sizes of carbohydrate-containing foods as well as meal and snack ideas.  Discussed different food groups and their effects on blood sugar, emphasizing carbohydrate-containing foods. Provided list of carbohydrates and recommended serving sizes of common foods.  Discussed importance of controlled and consistent carbohydrate intake throughout the day. Provided examples of ways to balance meals/snacks and encouraged intake of high-fiber, whole grain complex carbohydrates. Teach back method used.  Expect fair compliance.  Current diet order is Carb Modified, patient is consuming approximately 90-100% of meals at this time. Labs and medications reviewed. No further nutrition interventions warranted at this time. RD contact information provided.  If additional nutrition issues arise, please re-consult RD.   Gustavus Bryant, MS, RD, LDN Inpatient Clinical Dietitian Please see [facility] for contact information.

## 2021-02-07 NOTE — NC FL2 (Signed)
[place] LEVEL OF CARE SCREENING TOOL     IDENTIFICATION  Patient Name: [name] Douglas Birthdate: 1941/06/30 Sex: female Admission Date (Current Location): 02/06/2021  [facility] and [place] Number:  Herbalist and Address:  The [place], [place] [address], [place], [place] [address]      Provider Number: [id number]  Attending Physician Name and Address:  Kristeen Miss, MD  Relative Name and Phone Number:       Current Level of Care: Hospital Recommended Level of Care: [place] Prior Approval Number:    Date Approved/Denied:   PASRR Number: [id number]  Discharge Plan: SNF    Current Diagnoses: Patient Active Problem List   Diagnosis Date Noted   Lumbar stenosis with neurogenic claudication 02/06/2021   Compression fracture of fifth lumbar vertebra ([place]) 12/20/2020   DDD (degenerative disc disease), thoracolumbar 11/22/2020   DDD (degenerative disc disease), thoracic 11/10/2020   TIA (transient ischemic attack) 10/12/2020   Left shoulder pain 10/12/2020   Chronic thoracic back pain (Left) 10/10/2020   T12 compression fracture, sequela 10/10/2020   Lumbar foraminal stenosis (Bilateral: L2-3, L3-4, L4-5) (Right: L5-S1) (Severe left L4-5) 09/08/2020   Grade 1 Lumbar Anterolisthesis (L3/4 and L4/5) 09/08/2020   Grade 1 Lumbar Retrolisthesis (L2/3) 09/08/2020   Protrusion of lumbar intervertebral disc (Multilevel) 09/08/2020   Compression fracture of L1 lumbar vertebra, sequela 09/08/2020   Other intervertebral disc degeneration, lumbar region 09/08/2020   Lumbosacral facet syndrome (Multilevel) (Bilateral) 09/08/2020   Lumbar facet hypertrophy (Multilevel) (Bilateral) 09/08/2020   Ligamentum flavum hypertrophy 09/08/2020   Lumbar lateral recess stenosis (Right: L2-3) 09/08/2020   DDD (degenerative disc disease), lumbosacral 09/08/2020   Compression fracture of L5 vertebra ([place]) 09/08/2020   Osteoporosis with  pathological fracture of lumbar vertebra ([place]) 09/08/2020   Vitamin B12 deficiency 09/08/2020   Chronic pain syndrome 09/07/2020   Pharmacologic therapy 09/07/2020   Disorder of skeletal system 09/07/2020   Problems influencing health status 88/89/1694   Uncomplicated opioid dependence ([place]) 09/07/2020   Chronic low back pain (2ry area of Pain) (Bilateral) (R>L) w/ sciatica (Right) 09/07/2020   Chronic lower extremity pain (1ry area of Pain) (Right) 09/07/2020   Chronic lumbar radiculopathy (Right) 09/07/2020   Lumbosacral radiculopathy at L2 (Right) 09/07/2020   Lumbar radiculitis (L2 dermatome) (Right) 09/07/2020   Unspecified inflammatory spondylopathy, lumbar region ([place]) 09/07/2020   Chronic anticoagulation ([place]) 09/07/2020   Abnormal MRI, lumbar spine (04/20/2020) 09/07/2020   Intractable low back pain 08/03/2020   Fall down stairs 08/03/2020   Hemorrhagic stroke ([place]) 08/03/2020   Dizziness and giddiness 07/26/2020   Other chronic pain 07/26/2020   Other sequelae of cerebral infarction 07/18/2020   CVA (cerebral vascular accident) ([place]) 07/14/2020   Blurred vision 07/14/2020   Lumbar central spinal stenosis (Moderate: L2-3  Severe: L3-4, L4-5) w/ neurogenic claudication 05/31/2020   Muscle spasm 04/10/2020   Urinary tract infection with hematuria 04/10/2020   Headache disorder 12/21/2019   History of stroke 12/21/2019   Dizziness 11/06/2019   Atopic dermatitis 10/28/2019   History of CVA (cerebrovascular accident) 10/07/2019   Intractable episodic cluster headache 09/20/2019   Other symptoms and signs involving the nervous system 09/20/2019   Anxiety disorder, unspecified 08/21/2019   Dependence on supplemental oxygen 08/21/2019   Presence of artificial knee joint, bilateral 08/21/2019   Nicotine dependence, cigarettes, uncomplicated 50/38/8828   Iron deficiency anemia 05/17/2019   Vitamin D deficiency 05/17/2019   Flu vaccine need 05/17/2019   Encounter  for  general adult medical examination with abnormal findings 04/05/2019   Nausea and vomiting 04/05/2019   Dysuria 04/05/2019   Need for vaccination against Streptococcus pneumoniae using pneumococcal conjugate vaccine 7 04/05/2019   Occlusion and stenosis of bilateral carotid arteries 11/24/2017   Subarachnoid hemorrhage ([place]) 12/09/2016   Solitary pulmonary nodule 08/24/2016   Primary localized osteoarthritis of right knee 07/03/2016   Ileus ([place]) 04/24/2016   Colitis due to Clostridium difficile    Recurrent Clostridium difficile diarrhea    Enteritis due to Clostridium difficile 08/22/2014   Diarrhea 08/20/2014   Essential hypertension 08/20/2014   Hyperlipidemia 08/20/2014   Hypothyroidism 08/20/2014   Tobacco abuse 08/20/2014   COPD (chronic obstructive pulmonary disease) ([place]) 08/20/2014   Acute diarrhea 07/27/2014    Orientation RESPIRATION BLADDER Height & Weight     Self, Time, Situation, Place  Normal External catheter, Continent Weight: 216 lb (98 kg) Height:  5\' 4"  (162.6 cm)  BEHAVIORAL SYMPTOMS/MOOD NEUROLOGICAL BOWEL NUTRITION STATUS      Continent Diet (please see discharge summary)  AMBULATORY STATUS COMMUNICATION OF NEEDS Skin   Supervision Verbally Surgical wounds (closed incision,Back)                       Personal Care Assistance Level of Assistance  Bathing, Dressing, Feeding Bathing Assistance: Limited assistance Feeding assistance: Limited assistance Dressing Assistance: Limited assistance     Functional Limitations Info  Sight, Hearing, Speech Sight Info: Adequate Hearing Info: Adequate Speech Info: Adequate    SPECIAL CARE FACTORS FREQUENCY  PT (By licensed PT), OT (By licensed OT)     PT Frequency: 5x per week OT Frequency: 5x per week            Contractures Contractures Info: Not present    Additional Factors Info  Isolation Precautions, Code Status Code Status Info: FULL       Isolation Precautions Info: Heparin      Current Medications (02/07/2021):  This is the current hospital active medication list Current Facility-Administered Medications  Medication Dose Route Frequency Provider Last Rate Last Admin   0.9 %  sodium chloride infusion  250 mL Intravenous Continuous Kristeen Miss, MD       acetaminophen (TYLENOL) tablet 650 mg  650 mg Oral Q4H PRN Kristeen Miss, MD   650 mg at 02/06/21 2156   Or   acetaminophen (TYLENOL) suppository 650 mg  650 mg Rectal Q4H PRN Kristeen Miss, MD       alum & mag hydroxide-simeth (MAALOX/MYLANTA) 200-200-20 MG/5ML suspension 30 mL  30 mL Oral Q6H PRN Kristeen Miss, MD       atorvastatin (LIPITOR) tablet 80 mg  80 mg Oral q1800 Kristeen Miss, MD   80 mg at 02/06/21 1721   bisacodyl (DULCOLAX) suppository 10 mg  10 mg Rectal Daily PRN Kristeen Miss, MD       cyclobenzaprine (FLEXERIL) tablet 5 mg  5 mg Oral QHS Kristeen Miss, MD   5 mg at 02/06/21 2154   docusate sodium (COLACE) capsule 100 mg  100 mg Oral Daily PRN Kristeen Miss, MD       docusate sodium (COLACE) capsule 100 mg  100 mg Oral BID Kristeen Miss, MD   100 mg at 02/07/21 0815   donepezil (ARICEPT) tablet 5 mg  5 mg Oral QHS Kristeen Miss, MD   5 mg at 02/06/21 2154   gabapentin (NEURONTIN) capsule 200 mg  200 mg Oral TID Kristeen Miss, MD   200 mg  at 02/07/21 0815   lactated ringers infusion   Intravenous Continuous Kristeen Miss, MD 50 mL/hr at 02/07/21 0450 New Bag at 02/07/21 0450   levothyroxine (SYNTHROID) tablet 75 mcg  75 mcg Oral QAC breakfast Kristeen Miss, MD   75 mcg at 02/07/21 0447   losartan (COZAAR) tablet 25 mg  25 mg Oral Daily Kristeen Miss, MD   25 mg at 02/07/21 6629   menthol-cetylpyridinium (CEPACOL) lozenge 3 mg  1 lozenge Oral PRN Kristeen Miss, MD       Or   phenol (CHLORASEPTIC) mouth spray 1 spray  1 spray Mouth/Throat PRN Kristeen Miss, MD       methocarbamol (ROBAXIN) tablet 500 mg  500 mg Oral Q6H PRN Kristeen Miss, MD   500 mg at 02/07/21 4765   Or   methocarbamol (ROBAXIN)  500 mg in dextrose 5 % 50 mL IVPB  500 mg Intravenous Q6H PRN Kristeen Miss, MD       metoprolol succinate (TOPROL-XL) 24 hr tablet 100 mg  100 mg Oral Daily Kristeen Miss, MD   100 mg at 02/07/21 0815   morphine 2 MG/ML injection 2 mg  2 mg Intravenous Q2H PRN Kristeen Miss, MD   2 mg at 02/07/21 0032   ondansetron (ZOFRAN) tablet 4 mg  4 mg Oral Q6H PRN Kristeen Miss, MD       Or   ondansetron (ZOFRAN) injection 4 mg  4 mg Intravenous Q6H PRN Kristeen Miss, MD       oxyCODONE-acetaminophen (PERCOCET/ROXICET) 5-325 MG per tablet 1-2 tablet  1-2 tablet Oral Q4H PRN Kristeen Miss, MD   2 tablet at 02/07/21 0949   polyethylene glycol (MIRALAX / GLYCOLAX) packet 17 g  17 g Oral Daily PRN Kristeen Miss, MD       senna (SENOKOT) tablet 8.6 mg  1 tablet Oral BID Kristeen Miss, MD   8.6 mg at 02/07/21 0815   sodium chloride flush (NS) 0.9 % injection 3 mL  3 mL Intravenous Q12H Kristeen Miss, MD   3 mL at 02/07/21 0046   sodium chloride flush (NS) 0.9 % injection 3 mL  3 mL Intravenous PRN Kristeen Miss, MD       sodium phosphate (FLEET) 7-19 GM/118ML enema 1 enema  1 enema Rectal Once PRN Kristeen Miss, MD       venlafaxine XR (EFFEXOR-XR) 24 hr capsule 150 mg  150 mg Oral Q breakfast Kristeen Miss, MD   150 mg at 02/07/21 4650     Discharge Medications: Please see discharge summary for a list of discharge medications.  Relevant Imaging Results:  Relevant Lab Results:   Additional Information SSN [id number] Moderna COVID-19 Vaccine 10/06/2019 , 09/08/2019  no booster shot  Vinie Sill, [place]

## 2021-02-07 NOTE — Plan of Care (Signed)

## 2021-02-07 NOTE — TOC Progression Note (Signed)
Transition of Care [facility]) - Progression Note    Patient Details  Name: [name] Douglas MRN: [id number] Date of Birth: May 16, 1941  Transition of [facility]) CM/SW [place], [place] Phone Number: 02/07/2021, 12:58 PM  Clinical Narrative:     CSW visit with patient and her daughter, Larene [place]. CSW introduced self and explained role. CSW discussed with patient and family PT recommendation of short term rehab at [facility] Patient states she lives home alone. Patient and family acknowledges the need for therapy . Patient is agreeable to short term rehab at [facility]. Preferred SNF is [facility]. Patient has received covid vaccine but no booster shot.   CSW sent referral to [facility] ,then called and left voice message to review clinicals. CSW will provide bed offers once available. CSW will continue to follow and assist with discharge planning.  Thurmond Butts, MSW, LCSW Clinical Social Worker    Expected Discharge Plan: Skilled Nursing Facility Barriers to Discharge: SNF Pending bed offer  Expected Discharge Plan and Services Expected Discharge Plan: [place] arrangements for the past 2 months: Single Family Home                                       Social Determinants of Health (SDOH) Interventions    Readmission Risk Interventions No flowsheet data found.

## 2021-02-07 NOTE — Evaluation (Signed)
Physical Therapy Evaluation Patient Details Name: [name] Douglas MRN: [id number] DOB: Feb 01, 1941 Today's Date: 02/07/2021   History of Present Illness  Pt is [name] who presents with lumbar spondylosis and stenosis L3-5 now s/p bilateral laminotomies L3-5, discectomy on the right L4-5 02/06/2021. Significant PMH: asthma, COPD, HTN, CVA, tobacco use.  Clinical Impression  PTA, pt lives in alone in an apartment, is independent with ADL's, and is a limited household ambulator. Pt presents with decreased functional mobility secondary to pain, weakness, balance deficits, postural abnormalities. Pt reports radicular pain down to right knee. Ambulating x 25 feet with a walker at a min assist level. Presents as a high fall risk based on decreased gait speed. In light of deficits and decreased caregiver support, recommend SNF.     Follow Up Recommendations SNF    Equipment Recommendations  None recommended by PT    Recommendations for Other Services       Precautions / Restrictions Precautions Precautions: Back;Fall Precaution Booklet Issued: Yes (comment) Precaution Comments: Verbally reviewed, provided written handout Required Braces or Orthoses:  (no brace required) Restrictions Weight Bearing Restrictions: No      Mobility  Bed Mobility Overal bed mobility: Needs Assistance Bed Mobility: Rolling;Sidelying to Sit Rolling: Supervision Sidelying to sit: Supervision       General bed mobility comments: Cues for log roll technique, HOB elevated, use of rail    Transfers Overall transfer level: Needs assistance Equipment used: Rolling walker (2 wheeled) Transfers: Sit to/from Stand Sit to Stand: Min assist         General transfer comment: MinA to rise and steady, cues for hand placement  Ambulation/Gait Ambulation/Gait assistance: Min assist Gait Distance (Feet): 25 Feet Assistive device: Rolling walker (2 wheeled) Gait Pattern/deviations: Step-through  pattern;Decreased stride length;Trunk flexed Gait velocity: decreased Gait velocity interpretation: <1.31 ft/sec, indicative of household ambulator General Gait Details: Pt with increased trunk flexion, downward gaze, increased bilateral foot ER, often having to lean down onto forearms on walker due to pain. MinA for balance overall; one posterior LOB when stepping backwards towards chair  Stairs            Wheelchair Mobility    Modified Rankin (Stroke Patients Only)       Balance Overall balance assessment: Needs assistance Sitting-balance support: Feet supported Sitting balance-Leahy Scale: Fair     Standing balance support: Bilateral upper extremity supported Standing balance-Leahy Scale: Poor Standing balance comment: reliant on BUE support                             Pertinent Vitals/Pain Pain Assessment: Faces Faces Pain Scale: Hurts even more Pain Location: surgical site, radicular pain to R knee Pain Descriptors / Indicators: Grimacing;Operative site guarding;Radiating Pain Intervention(s): Limited activity within patient's tolerance;Monitored during session;Patient requesting pain meds-RN notified    Home Living Family/patient expects to be discharged to:: Private residence Living Arrangements: Alone Available Help at Discharge: Family;Available PRN/intermittently Type of Home: Apartment Home Access: Elevator     Home Layout: One level Home Equipment: Walker - 4 wheels;Cane - single point;Shower seat;Hand held Tourist information centre manager - 2 wheels;Cane - quad;Wheelchair - manual      Prior Function Level of Independence: Needs assistance   Gait / Transfers Assistance Needed: Limited household ambulator, uses "walls," and "furniture" to stabilize, w/c for longer distances  ADL's / Homemaking Assistance Needed: Independent ADL's, children assist with IADL's. has no been able to go to  the basement to do laundry        Hand Dominance         Extremity/Trunk Assessment   Upper Extremity Assessment Upper Extremity Assessment: Defer to OT evaluation    Lower Extremity Assessment Lower Extremity Assessment: LLE deficits/detail;RLE deficits/detail RLE Deficits / Details: History of TKA. Strength 5/5 except hip flexion 4/5 LLE Deficits / Details: History of TKA. Strength 5/5    Cervical / Trunk Assessment Cervical / Trunk Assessment: Kyphotic;Other exceptions Cervical / Trunk Exceptions: s/p lumbar sx  Communication   Communication: No difficulties  Cognition Arousal/Alertness: Awake/alert Behavior During Therapy: WFL for tasks assessed/performed Overall Cognitive Status: Within Functional Limits for tasks assessed                                        General Comments      Exercises     Assessment/Plan    PT Assessment Patient needs continued PT services  PT Problem List Decreased strength;Decreased activity tolerance;Decreased mobility;Decreased balance;Pain       PT Treatment Interventions DME instruction;Gait training;Therapeutic activities;Therapeutic exercise;Balance training;Patient/family education    PT Goals (Current goals can be found in the Care Plan section)  Acute Rehab PT Goals Patient Stated Goal: "Get back to [employer]." PT Goal Formulation: With patient Time For Goal Achievement: 02/21/21 Potential to Achieve Goals: Good    Frequency Min 5X/week   Barriers to discharge        Co-evaluation               AM-PAC PT "6 Clicks" Mobility  Outcome Measure Help needed turning from your back to your side while in a flat bed without using bedrails?: A Little Help needed moving from lying on your back to sitting on the side of a flat bed without using bedrails?: A Little Help needed moving to and from a bed to a chair (including a wheelchair)?: A Little Help needed standing up from a chair using your arms (e.g., wheelchair or bedside chair)?: A Little Help needed to walk  in hospital room?: A Little Help needed climbing 3-5 steps with a railing? : A Lot 6 Click Score: 17    End of Session Equipment Utilized During Treatment: Gait belt Activity Tolerance: Patient tolerated treatment well Patient left: in chair;with call bell/phone within reach;with chair alarm set Nurse Communication: Mobility status PT Visit Diagnosis: Unsteadiness on feet (R26.81);Other abnormalities of gait and mobility (R26.89);Difficulty in walking, not elsewhere classified (R26.2);Pain Pain - part of body:  (back)    Time: 7408-1448 PT Time Calculation (min) (ACUTE ONLY): 33 min   Charges:   PT Evaluation $PT Eval Moderate Complexity: 1 Mod PT Treatments $Therapeutic Activity: 8-22 mins        Wyona Almas, PT, DPT Acute Rehabilitation Services Pager [phone] Office [phone]   Deno Etienne 02/07/2021, 8:58 AM

## 2021-02-07 NOTE — Evaluation (Signed)
Occupational Therapy Evaluation Patient Details Name: [name] Douglas MRN: [id number] DOB: 1941/08/05 Today's Date: 02/07/2021    History of Present Illness Pt is [name] who presents with lumbar spondylosis and stenosis L3-5 now s/p bilateral laminotomies L3-5, discectomy on the right L4-5 02/06/2021. Significant PMH: asthma, COPD, HTN, CVA, tobacco use.   Clinical Impression   Pt PTA: Pt living alone and reports independence with ADL. Pt currently, Pt limited by pain and decreased ability to care for self safely. Pt seated for most ADL to maintain back precautions. Daughter in room. Pt minguardA to minA with RW in room. Pt standing at sink briefly for grooming tasks. Pt set-upA to modA for OOB ADL. Pt would greatly benefit from continued OT skilled services. OT following acutely. ** Pt and family want SNF as safest option.    Follow Up Recommendations  SNF    Equipment Recommendations  3 in 1 bedside commode    Recommendations for Other Services       Precautions / Restrictions Precautions Precautions: Back;Fall Precaution Booklet Issued: Yes (comment) Precaution Comments: Verbally reviewed, provided written handout Required Braces or Orthoses:  (no brace required) Restrictions Weight Bearing Restrictions: No      Mobility Bed Mobility Overal bed mobility: Needs Assistance Bed Mobility: Rolling;Sidelying to Sit Rolling: Supervision Sidelying to sit: Supervision       General bed mobility comments: Cues for log roll technique, HOB elevated, use of rail    Transfers Overall transfer level: Needs assistance Equipment used: Rolling walker (2 wheeled) Transfers: Sit to/from Stand Sit to Stand: Min assist         General transfer comment: MinA to rise and steady, cues for hand placement    Balance Overall balance assessment: Needs assistance Sitting-balance support: Feet supported Sitting balance-Leahy Scale: Fair     Standing balance support: Bilateral  upper extremity supported Standing balance-Leahy Scale: Poor Standing balance comment: reliant on BUE support                           ADL either performed or assessed with clinical judgement   ADL Overall ADL's : Needs assistance/impaired Eating/Feeding: Set up;Sitting   Grooming: Min guard;Standing   Upper Body Bathing: Set up;Sitting   Lower Body Bathing: Cueing for safety;Sitting/lateral leans;Moderate assistance   Upper Body Dressing : Set up;Sitting   Lower Body Dressing: Moderate assistance;Sitting/lateral leans;Sit to/from stand;Cueing for safety   Toilet Transfer: Min guard;Ambulation;RW   Toileting- Clothing Manipulation and Hygiene: Minimal assistance;Sit to/from stand;Sitting/lateral lean;Cueing for back precautions       Functional mobility during ADLs: Min guard;Rolling walker;Cueing for safety;Cueing for sequencing General ADL Comments: Pt limited by pain and decreased ability to care for self safely. Pt seated for most ADL to maintain back precautions. Daughter in room.     Vision Baseline Vision/History: Wears glasses Wears Glasses: Reading only Patient Visual Report: No change from baseline Vision Assessment?: No apparent visual deficits     Perception     Praxis      Pertinent Vitals/Pain Pain Assessment: Faces Faces Pain Scale: Hurts even more Pain Location: surgical site, radicular pain to R knee Pain Descriptors / Indicators: Grimacing;Operative site guarding;Radiating Pain Intervention(s): Monitored during session;Repositioned     Hand Dominance Right   Extremity/Trunk Assessment Upper Extremity Assessment Upper Extremity Assessment: Generalized weakness   Lower Extremity Assessment Lower Extremity Assessment: Generalized weakness;Defer to PT evaluation RLE Deficits / Details: History of TKA. LLE Deficits /  Details: History of TKA.   Cervical / Trunk Assessment Cervical / Trunk Assessment: Kyphotic;Other  exceptions Cervical / Trunk Exceptions: s/p lumbar sx   Communication Communication Communication: No difficulties   Cognition Arousal/Alertness: Awake/alert Behavior During Therapy: WFL for tasks assessed/performed Overall Cognitive Status: Within Functional Limits for tasks assessed                                     General Comments  daughter in room    Exercises     Shoulder [place] expects to be discharged to:: Private residence Living Arrangements: Alone Available Help at Discharge: Family;Available PRN/intermittently Type of Home: Apartment Home Access: Elevator     Home Layout: One level     Bathroom Shower/Tub: Teacher, early years/pre: Standard Bathroom Accessibility: Yes   Home Equipment: Environmental consultant - 4 wheels;Cane - single point;Shower seat;Hand held Tourist information centre manager - 2 wheels;Cane - quad;Wheelchair - manual          Prior Functioning/Environment Level of Independence: Needs assistance  Gait / Transfers Assistance Needed: Limited household ambulator, uses "walls," and "furniture" to stabilize, w/c for longer distances ADL's / Homemaking Assistance Needed: Independent ADL's, children assist with IADL's. has no been able to go to the basement to do laundry            OT Problem List: Decreased strength;Decreased cognition;Decreased activity tolerance;Decreased safety awareness;Obesity;Pain;Increased edema;Decreased knowledge of precautions      OT Treatment/Interventions: Self-care/ADL training;Therapeutic exercise;Energy conservation;DME and/or AE instruction;Neuromuscular education;Therapeutic activities;Visual/perceptual remediation/compensation;Patient/family education    OT Goals(Current goals can be found in the care plan section) Acute Rehab OT Goals Patient Stated Goal: "Get back to [employer]." OT Goal Formulation: With patient Time For Goal Achievement: 02/21/21 Potential to  Achieve Goals: Good ADL Goals Pt Will Perform Lower Body Dressing: with min guard assist;sitting/lateral leans;sit to/from stand;with adaptive equipment Pt Will Transfer to Toilet: ambulating;with supervision;grab bars Additional ADL Goal #1: Pt will perform OOB ADL x10 mins with minimal cues to maintain back precautions.  OT Frequency: Min 2X/week   Barriers to D/C:            Co-evaluation              AM-PAC OT "6 Clicks" Daily Activity     Outcome Measure Help from another person eating meals?: None Help from another person taking care of personal grooming?: A Little Help from another person toileting, which includes using toliet, bedpan, or urinal?: A Little Help from another person bathing (including washing, rinsing, drying)?: A Lot Help from another person to put on and taking off regular upper body clothing?: A Little Help from another person to put on and taking off regular lower body clothing?: A Lot 6 Click Score: 17   End of Session Equipment Utilized During Treatment: Gait belt;Rolling walker Nurse Communication: Mobility status;Precautions  Activity Tolerance: Patient limited by pain Patient left: in chair;with call bell/phone within reach;with chair alarm set;with family/visitor present  OT Visit Diagnosis: Unsteadiness on feet (R26.81);Muscle weakness (generalized) (M62.81);Pain Pain - part of body:  (low back)                Time: 9937-1696 OT Time Calculation (min): 25 min Charges:  OT General Charges $OT Visit: 1 Visit OT Evaluation $OT Eval Moderate Complexity: 1 Mod OT Treatments $Self Care/Home Management : 8-22 mins  Ebony Hail C, OTR/L Acute  Rehabilitation Services Pager: [phone] Office: [place] 02/07/2021, 9:04 PM

## 2021-02-08 DIAGNOSIS — F0150 Vascular dementia without behavioral disturbance: Secondary | ICD-10-CM | POA: Diagnosis not present

## 2021-02-08 DIAGNOSIS — I10 Essential (primary) hypertension: Secondary | ICD-10-CM | POA: Diagnosis not present

## 2021-02-08 DIAGNOSIS — M6281 Muscle weakness (generalized): Secondary | ICD-10-CM | POA: Diagnosis not present

## 2021-02-08 DIAGNOSIS — I639 Cerebral infarction, unspecified: Secondary | ICD-10-CM | POA: Diagnosis not present

## 2021-02-08 DIAGNOSIS — M48061 Spinal stenosis, lumbar region without neurogenic claudication: Secondary | ICD-10-CM | POA: Diagnosis not present

## 2021-02-08 DIAGNOSIS — R262 Difficulty in walking, not elsewhere classified: Secondary | ICD-10-CM | POA: Diagnosis not present

## 2021-02-08 DIAGNOSIS — Z4789 Encounter for other orthopedic aftercare: Secondary | ICD-10-CM | POA: Diagnosis not present

## 2021-02-08 DIAGNOSIS — E039 Hypothyroidism, unspecified: Secondary | ICD-10-CM | POA: Diagnosis not present

## 2021-02-08 DIAGNOSIS — E785 Hyperlipidemia, unspecified: Secondary | ICD-10-CM | POA: Diagnosis not present

## 2021-02-08 DIAGNOSIS — R52 Pain, unspecified: Secondary | ICD-10-CM | POA: Diagnosis not present

## 2021-02-08 DIAGNOSIS — E559 Vitamin D deficiency, unspecified: Secondary | ICD-10-CM | POA: Diagnosis not present

## 2021-02-08 DIAGNOSIS — R5381 Other malaise: Secondary | ICD-10-CM | POA: Diagnosis not present

## 2021-02-08 DIAGNOSIS — Z5189 Encounter for other specified aftercare: Secondary | ICD-10-CM | POA: Diagnosis not present

## 2021-02-08 DIAGNOSIS — G44009 Cluster headache syndrome, unspecified, not intractable: Secondary | ICD-10-CM | POA: Diagnosis not present

## 2021-02-08 DIAGNOSIS — Z741 Need for assistance with personal care: Secondary | ICD-10-CM | POA: Diagnosis not present

## 2021-02-08 DIAGNOSIS — R278 Other lack of coordination: Secondary | ICD-10-CM | POA: Diagnosis not present

## 2021-02-08 DIAGNOSIS — F329 Major depressive disorder, single episode, unspecified: Secondary | ICD-10-CM | POA: Diagnosis not present

## 2021-02-08 DIAGNOSIS — Z743 Need for continuous supervision: Secondary | ICD-10-CM | POA: Diagnosis not present

## 2021-02-08 DIAGNOSIS — J309 Allergic rhinitis, unspecified: Secondary | ICD-10-CM | POA: Diagnosis not present

## 2021-02-08 DIAGNOSIS — R41841 Cognitive communication deficit: Secondary | ICD-10-CM | POA: Diagnosis not present

## 2021-02-08 DIAGNOSIS — J449 Chronic obstructive pulmonary disease, unspecified: Secondary | ICD-10-CM | POA: Diagnosis not present

## 2021-02-08 DIAGNOSIS — M1990 Unspecified osteoarthritis, unspecified site: Secondary | ICD-10-CM | POA: Diagnosis not present

## 2021-02-08 DIAGNOSIS — R531 Weakness: Secondary | ICD-10-CM | POA: Diagnosis not present

## 2021-02-08 DIAGNOSIS — M48062 Spinal stenosis, lumbar region with neurogenic claudication: Secondary | ICD-10-CM | POA: Diagnosis not present

## 2021-02-08 MED ORDER — OXYCODONE-ACETAMINOPHEN 5-325 MG PO TABS: 1.0000 | ORAL_TABLET | ORAL | 0 refills | Status: DC | PRN

## 2021-02-08 MED ORDER — METHOCARBAMOL 500 MG PO TABS: 500.0000 mg | ORAL_TABLET | Freq: Four times a day (QID) | ORAL | 1 refills | Status: DC | PRN

## 2021-02-08 NOTE — Discharge Summary (Signed)
Physician Discharge Summary  Patient ID: [name] Douglas MRN: [id number] DOB/AGE: 1940/11/21 80 y.o.  Admit date: 02/06/2021 Discharge date: 02/08/2021  Admission Diagnoses: Lumbar stenosis with radiculopathy  Discharge Diagnoses: Lumbar stenosis with radiculopathy, neurogenic claudication Active Problems:   Lumbar stenosis with neurogenic claudication   Discharged Condition: fair  Hospital Course: Patient was admitted to undergo surgery which he tolerated well.  She is significantly debilitated and will require some further convalescence before she is independent enough to go home.  Consults: None  Significant Diagnostic Studies: None  Treatments: surgery: See op note  Discharge Exam: Blood pressure (!) 149/87, pulse 72, temperature 98.5 F (36.9 C), temperature source Oral, resp. rate 18, height 5\' 4"  (1.626 m), weight 98 kg, SpO2 95 %. Incision is clean and dry Station and gait is intact with moderate assistance.  Disposition: Discharge disposition: 03-Skilled Nursing Facility       Discharge Instructions     Call MD for:  redness, tenderness, or signs of infection (pain, swelling, redness, odor or green/yellow discharge around incision site)   Complete by: As directed    Call MD for:  severe uncontrolled pain   Complete by: As directed    Call MD for:  temperature >100.4   Complete by: As directed    Diet - low sodium heart healthy   Complete by: As directed    Discharge wound care:   Complete by: As directed    Okay to shower. Do not apply salves or appointments to incision. No heavy lifting with the upper extremities greater than 10 pounds. May resume driving when not requiring pain medication and patient feels comfortable with doing so.   Incentive spirometry RT   Complete by: As directed    Increase activity slowly   Complete by: As directed       Allergies as of 02/08/2021       Reactions   Heparin Nausea And Vomiting        Medication List      TAKE these medications    albuterol 108 (90 Base) MCG/ACT inhaler Commonly known as: VENTOLIN HFA Inhale 2 puffs into the lungs every 6 (six) hours as needed for wheezing or shortness of breath.   apixaban 5 MG Tabs tablet Commonly known as: Eliquis Take 1 tablet (5 mg total) by mouth 2 (two) times daily.   atorvastatin 80 MG tablet Commonly known as: LIPITOR Take 1 tablet (80 mg total) by mouth daily at 6 PM.   cyclobenzaprine 5 MG tablet Commonly known as: FLEXERIL TAKE 1 TABLET BY MOUTH AT BEDTIME   docusate sodium 100 MG capsule Commonly known as: COLACE Take 100 mg by mouth daily as needed for mild constipation.   donepezil 5 MG tablet Commonly known as: ARICEPT Take 1 tablet (5 mg total) by mouth at bedtime.   gabapentin 100 MG capsule Commonly known as: NEURONTIN Take 200 mg by mouth 3 (three) times daily.   GOODY HEADACHE PO Take 1 Package by mouth daily as needed (headache).   levothyroxine 75 MCG tablet Commonly known as: SYNTHROID Take 1 tablet (75 mcg total) by mouth daily before breakfast.   losartan 25 MG tablet Commonly known as: COZAAR TAKE 1 TABLET BY MOUTH DAILY   methocarbamol 500 MG tablet Commonly known as: ROBAXIN Take 1 tablet (500 mg total) by mouth every 6 (six) hours as needed for muscle spasms.   methylPREDNISolone 4 MG Tbpk tablet Commonly known as: MEDROL DOSEPAK Follow package insert   metoprolol succinate  50 MG 24 hr tablet Commonly known as: TOPROL-XL Take 2 tablets (100 mg total) by mouth daily.   oxyCODONE 5 MG immediate release tablet Commonly known as: Roxicodone Take 1 tablet (5 mg total) by mouth every 8 (eight) hours as needed for up to 20 doses for breakthrough pain.   oxyCODONE-acetaminophen 5-325 MG tablet Commonly known as: PERCOCET/ROXICET Take 1-2 tablets by mouth every 4 (four) hours as needed for moderate pain or severe pain.   traMADol 50 MG tablet Commonly known as: ULTRAM Take 50 mg by mouth every 6  (six) hours as needed for pain.   venlafaxine XR 75 MG 24 hr capsule Commonly known as: EFFEXOR-XR TAKE 2 CAPSULES BY MOUTH DAILY. What changed:  how much to take how to take this when to take this additional instructions   Vitamin B-12 5000 MCG Subl Place 1 tablet (5,000 mcg total) under the tongue daily.   Vitamin D (Ergocalciferol) 1.25 MG (50000 UNIT) Caps capsule Commonly known as: DRISDOL TAKE 1 CAPSULE BY MOUTH EVERY FRIDAY. What changed: See the new instructions.               Discharge Care Instructions  (From admission, onward)           Start     Ordered   02/08/21 0000  Discharge wound care:       Comments: Okay to shower. Do not apply salves or appointments to incision. No heavy lifting with the upper extremities greater than 10 pounds. May resume driving when not requiring pain medication and patient feels comfortable with doing so.   02/08/21 0906             Signed: Blanchie Dessert Cordelia Bessinger 02/08/2021, 9:07 AM

## 2021-02-08 NOTE — Progress Notes (Signed)
Physical Therapy Treatment Patient Details Name: [name] Douglas MRN: [id number] DOB: 1941/04/19 Today's Date: 02/08/2021    History of Present Illness Pt is [name] who presents with lumbar spondylosis and stenosis L3-5 now s/p bilateral laminotomies L3-5, discectomy on the right L4-5 02/06/2021. Significant PMH: asthma, COPD, HTN, CVA, tobacco use.    PT Comments    Pain remains a barrier, however, pt agreeable and motivated to participate. Pt reporting radicular pain to right buttock. Ambulating x 20 feet with a walker at a min assist level. Often has to lean down onto forearms on walker for relief (cues provided for spinal precautions). Will benefit from SNF for further strengthening, balance training, and progression of functional mobility.     Follow Up Recommendations  SNF     Equipment Recommendations  None recommended by PT    Recommendations for Other Services       Precautions / Restrictions Precautions Precautions: Back;Fall Precaution Booklet Issued: Yes (comment) Precaution Comments: Verbally reviewed, provided written handout Required Braces or Orthoses:  (no brace required) Restrictions Weight Bearing Restrictions: No    Mobility  Bed Mobility Overal bed mobility: Needs Assistance Bed Mobility: Rolling;Sidelying to Sit Rolling: Supervision Sidelying to sit: Min guard       General bed mobility comments: Good log roll technique, min guard assist to sit up on side of bed    Transfers Overall transfer level: Needs assistance Equipment used: Rolling walker (2 wheeled) Transfers: Sit to/from Stand Sit to Stand: Min assist         General transfer comment: MinA to rise and steady, cues for hand placement  Ambulation/Gait Ambulation/Gait assistance: Min assist Gait Distance (Feet): 20 Feet Assistive device: Rolling walker (2 wheeled) Gait Pattern/deviations: Step-through pattern;Decreased stride length;Trunk flexed Gait velocity: decreased    General Gait Details: Pt requiring several short standing rest breaks, leaning down on forearms (despite cues for precautions) on walker due to significant pain. MinA overall for balance.   Stairs             Wheelchair Mobility    Modified Rankin (Stroke Patients Only)       Balance Overall balance assessment: Needs assistance Sitting-balance support: Feet supported Sitting balance-Leahy Scale: Fair     Standing balance support: Bilateral upper extremity supported Standing balance-Leahy Scale: Poor Standing balance comment: reliant on BUE support                            Cognition Arousal/Alertness: Awake/alert Behavior During Therapy: WFL for tasks assessed/performed Overall Cognitive Status: Within Functional Limits for tasks assessed                                        Exercises General Exercises - Lower Extremity Long Arc Quad: Both;10 reps;Seated Toe Raises: Both;10 reps;Seated Heel Raises: Both;10 reps;Seated    General Comments        Pertinent Vitals/Pain Pain Assessment: Faces Faces Pain Scale: Hurts worst Pain Location: surgical site, radicular pain to R buttocks with ambulating Pain Descriptors / Indicators: Grimacing;Operative site guarding;Radiating Pain Intervention(s): Limited activity within patient's tolerance;Monitored during session;Premedicated before session    Home Living                      Prior Function            PT  Goals (current goals can now be found in the care plan section) Acute Rehab PT Goals Patient Stated Goal: "Get back to [employer]." PT Goal Formulation: With patient Time For Goal Achievement: 02/21/21 Potential to Achieve Goals: Good Progress towards PT goals: Progressing toward goals    Frequency    Min 5X/week      PT Plan Current plan remains appropriate    Co-evaluation              AM-PAC PT "6 Clicks" Mobility   Outcome Measure  Help needed  turning from your back to your side while in a flat bed without using bedrails?: A Little Help needed moving from lying on your back to sitting on the side of a flat bed without using bedrails?: A Little Help needed moving to and from a bed to a chair (including a wheelchair)?: A Little Help needed standing up from a chair using your arms (e.g., wheelchair or bedside chair)?: A Little Help needed to walk in hospital room?: A Little Help needed climbing 3-5 steps with a railing? : A Lot 6 Click Score: 17    End of Session Equipment Utilized During Treatment: Gait belt Activity Tolerance: Patient tolerated treatment well Patient left: in chair;with call bell/phone within reach;with chair alarm set Nurse Communication: Mobility status PT Visit Diagnosis: Unsteadiness on feet (R26.81);Other abnormalities of gait and mobility (R26.89);Difficulty in walking, not elsewhere classified (R26.2);Pain Pain - part of body:  (back)     Time: 4210-3128 PT Time Calculation (min) (ACUTE ONLY): 16 min  Charges:  $Therapeutic Activity: 8-22 mins                     [name] Douglas, PT, DPT Acute Rehabilitation Services Pager [phone] Office [phone]    [name] Douglas 02/08/2021, 1:10 PM

## 2021-02-08 NOTE — Plan of Care (Signed)
  Problem: Education: Goal: Knowledge of General Education information will improve Description: Including pain rating scale, medication(s)/side effects and non-pharmacologic comfort measures Outcome: Progressing   Problem: Clinical Measurements: Goal: Ability to maintain clinical measurements within normal limits will improve Outcome: Progressing   Problem: Clinical Measurements: Goal: Will remain free from infection Outcome: Progressing   Problem: Clinical Measurements: Goal: Respiratory complications will improve Outcome: Progressing   Problem: Clinical Measurements: Goal: Cardiovascular complication will be avoided Outcome: Progressing

## 2021-02-08 NOTE — TOC Transition Note (Signed)
Transition of Care [facility]) - CM/SW Discharge Note   Patient Details  Name: [name] Douglas MRN: [id number] Date of Birth: [birth year]  Transition of [facility] CM/SW Contact:  Vinie Sill, LCSW Phone Number: 02/08/2021, 12:29 PM   Clinical Narrative:     Patient will Discharge to: [facility]  Discharge Date: 02/09/2020 Family Notified: daughter, left voice message Transport By: Corey Harold  Per MD patient is ready for discharge. RN, patient, and facility notified of discharge. Discharge Summary sent to facility. RN given number for [phone], Room 406). Ambulance transport requested for patient.   Clinical Social Worker signing off.  Thurmond Butts, MSW, LCSW Clinical Social Worker     Final next level of care: Skilled Nursing Facility Barriers to Discharge: Barriers Resolved   Patient Goals and CMS Choice        Discharge Placement              Patient chooses bed at: [facility] [place] Patient to be transferred to facility by: [place] Name of family member notified: daughter-left voice message Patient and family notified of of transfer: 02/08/21  Discharge Plan and Services                                     Social Determinants of Health (SDOH) Interventions     Readmission Risk Interventions No flowsheet data found.

## 2021-02-08 NOTE — Progress Notes (Signed)
Report called to Charlie at [employer].  IV removed, discharge paperwork printed for [facility]AR.

## 2021-02-09 DIAGNOSIS — J449 Chronic obstructive pulmonary disease, unspecified: Secondary | ICD-10-CM | POA: Diagnosis not present

## 2021-02-09 DIAGNOSIS — F329 Major depressive disorder, single episode, unspecified: Secondary | ICD-10-CM | POA: Diagnosis not present

## 2021-02-09 DIAGNOSIS — R52 Pain, unspecified: Secondary | ICD-10-CM | POA: Diagnosis not present

## 2021-02-09 DIAGNOSIS — E785 Hyperlipidemia, unspecified: Secondary | ICD-10-CM | POA: Diagnosis not present

## 2021-02-09 DIAGNOSIS — I10 Essential (primary) hypertension: Secondary | ICD-10-CM | POA: Diagnosis not present

## 2021-02-09 DIAGNOSIS — Z4789 Encounter for other orthopedic aftercare: Secondary | ICD-10-CM | POA: Diagnosis not present

## 2021-02-09 DIAGNOSIS — M48062 Spinal stenosis, lumbar region with neurogenic claudication: Secondary | ICD-10-CM | POA: Diagnosis not present

## 2021-02-09 DIAGNOSIS — R262 Difficulty in walking, not elsewhere classified: Secondary | ICD-10-CM | POA: Diagnosis not present

## 2021-02-09 DIAGNOSIS — E039 Hypothyroidism, unspecified: Secondary | ICD-10-CM | POA: Diagnosis not present

## 2021-02-13 DIAGNOSIS — Z5189 Encounter for other specified aftercare: Secondary | ICD-10-CM | POA: Diagnosis not present

## 2021-02-13 DIAGNOSIS — M1990 Unspecified osteoarthritis, unspecified site: Secondary | ICD-10-CM | POA: Diagnosis not present

## 2021-02-13 DIAGNOSIS — J449 Chronic obstructive pulmonary disease, unspecified: Secondary | ICD-10-CM | POA: Diagnosis not present

## 2021-02-13 DIAGNOSIS — E785 Hyperlipidemia, unspecified: Secondary | ICD-10-CM | POA: Diagnosis not present

## 2021-02-13 DIAGNOSIS — E559 Vitamin D deficiency, unspecified: Secondary | ICD-10-CM | POA: Diagnosis not present

## 2021-02-13 DIAGNOSIS — M48062 Spinal stenosis, lumbar region with neurogenic claudication: Secondary | ICD-10-CM | POA: Diagnosis not present

## 2021-02-13 DIAGNOSIS — R262 Difficulty in walking, not elsewhere classified: Secondary | ICD-10-CM | POA: Diagnosis not present

## 2021-02-13 DIAGNOSIS — F0150 Vascular dementia without behavioral disturbance: Secondary | ICD-10-CM | POA: Diagnosis not present

## 2021-02-13 DIAGNOSIS — M6281 Muscle weakness (generalized): Secondary | ICD-10-CM | POA: Diagnosis not present

## 2021-02-14 NOTE — Progress Notes (Signed)
Carelink Summary Report / Loop Recorder 

## 2021-02-15 DIAGNOSIS — F0150 Vascular dementia without behavioral disturbance: Secondary | ICD-10-CM | POA: Diagnosis not present

## 2021-02-15 DIAGNOSIS — M48062 Spinal stenosis, lumbar region with neurogenic claudication: Secondary | ICD-10-CM | POA: Diagnosis not present

## 2021-02-15 DIAGNOSIS — J309 Allergic rhinitis, unspecified: Secondary | ICD-10-CM | POA: Diagnosis not present

## 2021-02-21 DIAGNOSIS — J449 Chronic obstructive pulmonary disease, unspecified: Secondary | ICD-10-CM | POA: Diagnosis not present

## 2021-02-21 DIAGNOSIS — M48062 Spinal stenosis, lumbar region with neurogenic claudication: Secondary | ICD-10-CM | POA: Diagnosis not present

## 2021-02-21 DIAGNOSIS — J309 Allergic rhinitis, unspecified: Secondary | ICD-10-CM | POA: Diagnosis not present

## 2021-02-21 DIAGNOSIS — M6281 Muscle weakness (generalized): Secondary | ICD-10-CM | POA: Diagnosis not present

## 2021-02-23 DIAGNOSIS — M6281 Muscle weakness (generalized): Secondary | ICD-10-CM | POA: Diagnosis not present

## 2021-02-23 DIAGNOSIS — J449 Chronic obstructive pulmonary disease, unspecified: Secondary | ICD-10-CM | POA: Diagnosis not present

## 2021-02-23 DIAGNOSIS — M48062 Spinal stenosis, lumbar region with neurogenic claudication: Secondary | ICD-10-CM | POA: Diagnosis not present

## 2021-02-27 ENCOUNTER — Ambulatory Visit (INDEPENDENT_AMBULATORY_CARE_PROVIDER_SITE_OTHER): Payer: Medicare Other

## 2021-02-27 DIAGNOSIS — I639 Cerebral infarction, unspecified: Secondary | ICD-10-CM

## 2021-02-27 LAB — CUP PACEART REMOTE DEVICE CHECK
Date Time Interrogation Session: 20220706004306
Implantable Pulse Generator Implant Date: 20190516

## 2021-03-01 ENCOUNTER — Other Ambulatory Visit: Payer: Self-pay | Admitting: Nurse Practitioner

## 2021-03-01 DIAGNOSIS — M48062 Spinal stenosis, lumbar region with neurogenic claudication: Secondary | ICD-10-CM | POA: Diagnosis not present

## 2021-03-01 DIAGNOSIS — I10 Essential (primary) hypertension: Secondary | ICD-10-CM

## 2021-03-01 DIAGNOSIS — M6281 Muscle weakness (generalized): Secondary | ICD-10-CM | POA: Diagnosis not present

## 2021-03-01 DIAGNOSIS — J449 Chronic obstructive pulmonary disease, unspecified: Secondary | ICD-10-CM | POA: Diagnosis not present

## 2021-03-03 DIAGNOSIS — Z9181 History of falling: Secondary | ICD-10-CM | POA: Diagnosis not present

## 2021-03-03 DIAGNOSIS — M1711 Unilateral primary osteoarthritis, right knee: Secondary | ICD-10-CM | POA: Diagnosis not present

## 2021-03-03 DIAGNOSIS — F0390 Unspecified dementia without behavioral disturbance: Secondary | ICD-10-CM | POA: Diagnosis not present

## 2021-03-03 DIAGNOSIS — Z8673 Personal history of transient ischemic attack (TIA), and cerebral infarction without residual deficits: Secondary | ICD-10-CM | POA: Diagnosis not present

## 2021-03-03 DIAGNOSIS — E785 Hyperlipidemia, unspecified: Secondary | ICD-10-CM | POA: Diagnosis not present

## 2021-03-03 DIAGNOSIS — E039 Hypothyroidism, unspecified: Secondary | ICD-10-CM | POA: Diagnosis not present

## 2021-03-03 DIAGNOSIS — M48062 Spinal stenosis, lumbar region with neurogenic claudication: Secondary | ICD-10-CM | POA: Diagnosis not present

## 2021-03-03 DIAGNOSIS — J45909 Unspecified asthma, uncomplicated: Secondary | ICD-10-CM | POA: Diagnosis not present

## 2021-03-03 DIAGNOSIS — Z7901 Long term (current) use of anticoagulants: Secondary | ICD-10-CM | POA: Diagnosis not present

## 2021-03-03 DIAGNOSIS — M5416 Radiculopathy, lumbar region: Secondary | ICD-10-CM | POA: Diagnosis not present

## 2021-03-03 DIAGNOSIS — F32A Depression, unspecified: Secondary | ICD-10-CM | POA: Diagnosis not present

## 2021-03-03 DIAGNOSIS — I10 Essential (primary) hypertension: Secondary | ICD-10-CM | POA: Diagnosis not present

## 2021-03-03 DIAGNOSIS — J449 Chronic obstructive pulmonary disease, unspecified: Secondary | ICD-10-CM | POA: Diagnosis not present

## 2021-03-03 DIAGNOSIS — M810 Age-related osteoporosis without current pathological fracture: Secondary | ICD-10-CM | POA: Diagnosis not present

## 2021-03-03 DIAGNOSIS — M6281 Muscle weakness (generalized): Secondary | ICD-10-CM | POA: Diagnosis not present

## 2021-03-07 DIAGNOSIS — M6281 Muscle weakness (generalized): Secondary | ICD-10-CM | POA: Diagnosis not present

## 2021-03-07 DIAGNOSIS — M5416 Radiculopathy, lumbar region: Secondary | ICD-10-CM | POA: Diagnosis not present

## 2021-03-07 DIAGNOSIS — M48062 Spinal stenosis, lumbar region with neurogenic claudication: Secondary | ICD-10-CM | POA: Diagnosis not present

## 2021-03-07 DIAGNOSIS — J449 Chronic obstructive pulmonary disease, unspecified: Secondary | ICD-10-CM | POA: Diagnosis not present

## 2021-03-07 DIAGNOSIS — I10 Essential (primary) hypertension: Secondary | ICD-10-CM | POA: Diagnosis not present

## 2021-03-07 DIAGNOSIS — M1711 Unilateral primary osteoarthritis, right knee: Secondary | ICD-10-CM | POA: Diagnosis not present

## 2021-03-09 DIAGNOSIS — J449 Chronic obstructive pulmonary disease, unspecified: Secondary | ICD-10-CM | POA: Diagnosis not present

## 2021-03-09 DIAGNOSIS — M1711 Unilateral primary osteoarthritis, right knee: Secondary | ICD-10-CM | POA: Diagnosis not present

## 2021-03-09 DIAGNOSIS — M6281 Muscle weakness (generalized): Secondary | ICD-10-CM | POA: Diagnosis not present

## 2021-03-09 DIAGNOSIS — M48062 Spinal stenosis, lumbar region with neurogenic claudication: Secondary | ICD-10-CM | POA: Diagnosis not present

## 2021-03-09 DIAGNOSIS — M5416 Radiculopathy, lumbar region: Secondary | ICD-10-CM | POA: Diagnosis not present

## 2021-03-09 DIAGNOSIS — I10 Essential (primary) hypertension: Secondary | ICD-10-CM | POA: Diagnosis not present

## 2021-03-14 DIAGNOSIS — I10 Essential (primary) hypertension: Secondary | ICD-10-CM | POA: Diagnosis not present

## 2021-03-14 DIAGNOSIS — M6281 Muscle weakness (generalized): Secondary | ICD-10-CM | POA: Diagnosis not present

## 2021-03-14 DIAGNOSIS — M5416 Radiculopathy, lumbar region: Secondary | ICD-10-CM | POA: Diagnosis not present

## 2021-03-14 DIAGNOSIS — J449 Chronic obstructive pulmonary disease, unspecified: Secondary | ICD-10-CM | POA: Diagnosis not present

## 2021-03-14 DIAGNOSIS — M1711 Unilateral primary osteoarthritis, right knee: Secondary | ICD-10-CM | POA: Diagnosis not present

## 2021-03-14 DIAGNOSIS — M48062 Spinal stenosis, lumbar region with neurogenic claudication: Secondary | ICD-10-CM | POA: Diagnosis not present

## 2021-03-15 DIAGNOSIS — M6281 Muscle weakness (generalized): Secondary | ICD-10-CM | POA: Diagnosis not present

## 2021-03-15 DIAGNOSIS — M48062 Spinal stenosis, lumbar region with neurogenic claudication: Secondary | ICD-10-CM | POA: Diagnosis not present

## 2021-03-15 DIAGNOSIS — M5416 Radiculopathy, lumbar region: Secondary | ICD-10-CM | POA: Diagnosis not present

## 2021-03-15 DIAGNOSIS — I10 Essential (primary) hypertension: Secondary | ICD-10-CM | POA: Diagnosis not present

## 2021-03-15 DIAGNOSIS — J449 Chronic obstructive pulmonary disease, unspecified: Secondary | ICD-10-CM | POA: Diagnosis not present

## 2021-03-15 DIAGNOSIS — M1711 Unilateral primary osteoarthritis, right knee: Secondary | ICD-10-CM | POA: Diagnosis not present

## 2021-03-17 ENCOUNTER — Telehealth: Payer: Self-pay

## 2021-03-17 NOTE — Telephone Encounter (Signed)
Received paperwork from [facility]. Sent to dfk for review and signature-Toni

## 2021-03-21 DIAGNOSIS — J449 Chronic obstructive pulmonary disease, unspecified: Secondary | ICD-10-CM | POA: Diagnosis not present

## 2021-03-21 DIAGNOSIS — M5416 Radiculopathy, lumbar region: Secondary | ICD-10-CM | POA: Diagnosis not present

## 2021-03-21 DIAGNOSIS — M48062 Spinal stenosis, lumbar region with neurogenic claudication: Secondary | ICD-10-CM | POA: Diagnosis not present

## 2021-03-21 DIAGNOSIS — M6281 Muscle weakness (generalized): Secondary | ICD-10-CM | POA: Diagnosis not present

## 2021-03-21 DIAGNOSIS — I10 Essential (primary) hypertension: Secondary | ICD-10-CM | POA: Diagnosis not present

## 2021-03-21 DIAGNOSIS — M1711 Unilateral primary osteoarthritis, right knee: Secondary | ICD-10-CM | POA: Diagnosis not present

## 2021-03-22 DIAGNOSIS — M1711 Unilateral primary osteoarthritis, right knee: Secondary | ICD-10-CM | POA: Diagnosis not present

## 2021-03-22 DIAGNOSIS — M5416 Radiculopathy, lumbar region: Secondary | ICD-10-CM | POA: Diagnosis not present

## 2021-03-22 DIAGNOSIS — J449 Chronic obstructive pulmonary disease, unspecified: Secondary | ICD-10-CM | POA: Diagnosis not present

## 2021-03-22 DIAGNOSIS — M48062 Spinal stenosis, lumbar region with neurogenic claudication: Secondary | ICD-10-CM | POA: Diagnosis not present

## 2021-03-22 DIAGNOSIS — M6281 Muscle weakness (generalized): Secondary | ICD-10-CM | POA: Diagnosis not present

## 2021-03-22 DIAGNOSIS — I10 Essential (primary) hypertension: Secondary | ICD-10-CM | POA: Diagnosis not present

## 2021-03-22 NOTE — Progress Notes (Signed)
Carelink Summary Report / Loop Recorder 

## 2021-03-26 DIAGNOSIS — M48062 Spinal stenosis, lumbar region with neurogenic claudication: Secondary | ICD-10-CM | POA: Diagnosis not present

## 2021-03-26 DIAGNOSIS — M6281 Muscle weakness (generalized): Secondary | ICD-10-CM | POA: Diagnosis not present

## 2021-03-29 ENCOUNTER — Telehealth: Payer: Self-pay

## 2021-03-29 NOTE — Telephone Encounter (Signed)
[facility] physical therapist call [phone] that pt missed today visit he went to his house she don't answer advised him to back call schedule  and I spoke with daughter pt is doing ok and they will call PT and schedule

## 2021-04-03 ENCOUNTER — Ambulatory Visit (INDEPENDENT_AMBULATORY_CARE_PROVIDER_SITE_OTHER): Payer: Medicare Other

## 2021-04-03 DIAGNOSIS — I639 Cerebral infarction, unspecified: Secondary | ICD-10-CM

## 2021-04-04 DIAGNOSIS — M48062 Spinal stenosis, lumbar region with neurogenic claudication: Secondary | ICD-10-CM | POA: Diagnosis not present

## 2021-04-04 LAB — CUP PACEART REMOTE DEVICE CHECK
Date Time Interrogation Session: 20220808014137
Implantable Pulse Generator Implant Date: 20190516

## 2021-04-06 ENCOUNTER — Encounter: Payer: Self-pay | Admitting: Emergency Medicine

## 2021-04-06 ENCOUNTER — Other Ambulatory Visit: Payer: Self-pay

## 2021-04-06 ENCOUNTER — Emergency Department
Admission: EM | Admit: 2021-04-06 | Discharge: 2021-04-06 | Disposition: A | Discharge status: Home / Self Care | Payer: Medicare Other | Attending: Emergency Medicine | Admitting: Emergency Medicine

## 2021-04-06 DIAGNOSIS — R232 Flushing: Secondary | ICD-10-CM | POA: Diagnosis not present

## 2021-04-06 DIAGNOSIS — I10 Essential (primary) hypertension: Secondary | ICD-10-CM | POA: Insufficient documentation

## 2021-04-06 DIAGNOSIS — Z5321 Procedure and treatment not carried out due to patient leaving prior to being seen by health care provider: Secondary | ICD-10-CM | POA: Diagnosis not present

## 2021-04-06 NOTE — ED Triage Notes (Addendum)
Pt arrived via POV with c/o elevated BP, daughter checked multiple times and was found to be elevated.  Pt has hx of CVA.  Pt denies any pain, c/o blurry vision. Pt also had flushed cheeks when she had her BP checked at home.   Pt took BP meds at home this morning.

## 2021-04-06 NOTE — ED Notes (Addendum)
Pt does not want to stay to be evaluated after triage and BP checked. Pt wheeled out to son in lobby, informed her to return if any stroke sxs, pt plans to follow up with PCP has appt on Monday already.

## 2021-04-10 ENCOUNTER — Ambulatory Visit (INDEPENDENT_AMBULATORY_CARE_PROVIDER_SITE_OTHER): Payer: Medicare Other | Admitting: Nurse Practitioner

## 2021-04-10 ENCOUNTER — Encounter: Payer: Self-pay | Admitting: Nurse Practitioner

## 2021-04-10 ENCOUNTER — Other Ambulatory Visit: Payer: Self-pay

## 2021-04-10 VITALS — BP 160/78 | HR 70 | Temp 97.1°F | Resp 16 | Ht 64.0 in | Wt 221.0 lb

## 2021-04-10 DIAGNOSIS — Z23 Encounter for immunization: Secondary | ICD-10-CM | POA: Diagnosis not present

## 2021-04-10 DIAGNOSIS — I10 Essential (primary) hypertension: Secondary | ICD-10-CM | POA: Diagnosis not present

## 2021-04-10 DIAGNOSIS — D649 Anemia, unspecified: Secondary | ICD-10-CM

## 2021-04-10 DIAGNOSIS — I749 Embolism and thrombosis of unspecified artery: Secondary | ICD-10-CM

## 2021-04-10 DIAGNOSIS — M48062 Spinal stenosis, lumbar region with neurogenic claudication: Secondary | ICD-10-CM

## 2021-04-10 DIAGNOSIS — R5383 Other fatigue: Secondary | ICD-10-CM

## 2021-04-10 DIAGNOSIS — E782 Mixed hyperlipidemia: Secondary | ICD-10-CM

## 2021-04-10 DIAGNOSIS — Z0001 Encounter for general adult medical examination with abnormal findings: Secondary | ICD-10-CM

## 2021-04-10 DIAGNOSIS — E559 Vitamin D deficiency, unspecified: Secondary | ICD-10-CM | POA: Diagnosis not present

## 2021-04-10 DIAGNOSIS — R300 Dysuria: Secondary | ICD-10-CM

## 2021-04-10 DIAGNOSIS — G459 Transient cerebral ischemic attack, unspecified: Secondary | ICD-10-CM | POA: Diagnosis not present

## 2021-04-10 MED ORDER — AMLODIPINE BESYLATE 5 MG PO TABS
5.0000 mg | ORAL_TABLET | Freq: Every day | ORAL | 0 refills | Status: DC
Start: 2021-04-10 — End: 2021-05-16

## 2021-04-10 MED ORDER — PNEUMOCOCCAL 20-VAL CONJ VACC 0.5 ML IM SUSY: 0.5000 mL | PREFILLED_SYRINGE | INTRAMUSCULAR | 0 refills | Status: AC

## 2021-04-10 MED ORDER — ZOSTER VAC RECOMB ADJUVANTED 50 MCG/0.5ML IM SUSR: 0.5000 mL | Freq: Once | INTRAMUSCULAR | 0 refills | Status: AC

## 2021-04-10 NOTE — Progress Notes (Addendum)
[facility] [place], [place] [address]  Internal MEDICINE  Office Visit Note  Patient Name: [name] Douglas  [id number]  PT:[id number]  Date of Service: 04/10/2021  Chief Complaint  Patient presents with   Medicare Wellness    Discuss BP   Depression   Gastroesophageal Reflux   Hyperlipidemia   Hypertension   Anxiety   Asthma   COPD    HPI [name] Douglas presents for an annual well visit and physical exam. she has a history of COPD, asthma, arthritis, anxiety, depression, diabetes, GERD, hyperlipidemia, hypertension, stroke, hypothyroidism, diverticulosis, paroxysmal atrial fibrillation On Eliquis  PVD and a hysterectomy. She also has a loop recorder that was implanted in 2019.  She has had 2 doses of the COVID vaccine She has a  She lives at home with family. She denies any pain. She has no other questions or concerns today.   In November she suffered a pontine CVA characterized by some hypodensity in the midportion of the pons and this gradually took some time to recover.  She noted that she felt weak all over and had some double vision and visual symptoms that gradually recovered  Old fracture of the L1 vertebrae likely due to osteoporosis Recent decompression at L3-4 and L4-5 due to spinal stenosis with neurogenic claudication   Current Medication: Outpatient Encounter Medications as of 04/10/2021  Medication Sig   albuterol (VENTOLIN HFA) 108 (90 Base) MCG/ACT inhaler Inhale 2 puffs into the lungs every 6 (six) hours as needed for wheezing or shortness of breath.   amLODipine (NORVASC) 5 MG tablet Take 1 tablet (5 mg total) by mouth daily.   apixaban (ELIQUIS) 5 MG TABS tablet Take 1 tablet (5 mg total) by mouth 2 (two) times daily.   Aspirin-Acetaminophen-Caffeine (GOODY HEADACHE PO) Take 1 Package by mouth daily as needed (headache).   atorvastatin (LIPITOR) 80 MG tablet Take 1 tablet (80 mg total) by mouth daily at 6 PM.   cyclobenzaprine (FLEXERIL) 5 MG  tablet TAKE 1 TABLET BY MOUTH AT BEDTIME   docusate sodium (COLACE) 100 MG capsule Take 100 mg by mouth daily as needed for mild constipation.    donepezil (ARICEPT) 5 MG tablet Take 1 tablet (5 mg total) by mouth at bedtime.   gabapentin (NEURONTIN) 100 MG capsule Take 200 mg by mouth 3 (three) times daily.   levothyroxine (SYNTHROID) 75 MCG tablet Take 1 tablet (75 mcg total) by mouth daily before breakfast.   losartan (COZAAR) 25 MG tablet TAKE 1 TABLET BY MOUTH DAILY   methocarbamol (ROBAXIN) 500 MG tablet Take 1 tablet (500 mg total) by mouth every 6 (six) hours as needed for muscle spasms.   methylPREDNISolone (MEDROL DOSEPAK) 4 MG TBPK tablet Follow package insert   metoprolol succinate (TOPROL-XL) 50 MG 24 hr tablet Take 2 tablets (100 mg total) by mouth daily.   oxyCODONE (ROXICODONE) 5 MG immediate release tablet Take 1 tablet (5 mg total) by mouth every 8 (eight) hours as needed for up to 20 doses for breakthrough pain.   oxyCODONE-acetaminophen (PERCOCET/ROXICET) 5-325 MG tablet Take 1-2 tablets by mouth every 4 (four) hours as needed for moderate pain or severe pain.   predniSONE (DELTASONE) 5 MG tablet Take by mouth.   traMADol (ULTRAM) 50 MG tablet Take 50 mg by mouth every 6 (six) hours as needed for pain.   venlafaxine XR (EFFEXOR-XR) 75 MG 24 hr capsule TAKE 2 CAPSULES BY MOUTH DAILY.   Vitamin D, Ergocalciferol, (DRISDOL) 1.25 MG (50000  UNIT) CAPS capsule TAKE 1 CAPSULE BY MOUTH EVERY FRIDAY.   [DISCONTINUED] pneumococcal 20-Val Conj Vacc (PREVNAR 20) 0.5 ML injection Inject 0.5 mLs into the muscle tomorrow at 10 am.   [DISCONTINUED] Zoster Vaccine Adjuvanted [facility]) injection Inject 0.5 mLs into the muscle once.   [EXPIRED] pneumococcal 20-Val Conj Vacc (PREVNAR 20) 0.5 ML injection Inject 0.5 mLs into the muscle tomorrow at 10 am for 1 dose.   [EXPIRED] Zoster Vaccine Adjuvanted [facility]) injection Inject 0.5 mLs into the muscle once for 1 dose.   [DISCONTINUED]  Cyanocobalamin (VITAMIN B-12) 5000 MCG SUBL Place 1 tablet (5,000 mcg total) under the tongue daily. (Patient not taking: Reported on 02/02/2021)   No facility-administered encounter medications on file as of 04/10/2021.    Surgical History: Past Surgical History:  Procedure Laterality Date   CARDIAC CATHETERIZATION  ~ 2007   EXCISIONAL HEMORRHOIDECTOMY  1960's   EYE SURGERY Bilateral    Cataract extraction with IOL   KNEE ARTHROSCOPY Left X 2 <2008   KNEE ARTHROSCOPY WITH SUBCHONDROPLASTY Right 04/19/2016   Procedure: KNEE ARTHROSCOPY WITH SUBCHONDROPLASTY, PARTIAL MENISCECTOMY;  Surgeon: Hessie Knows, MD;  Location: [facility];  Service: Orthopedics;  Laterality: Right;   LOOP RECORDER INSERTION N/A 01/02/2018   Procedure: LOOP RECORDER INSERTION;  Surgeon: Deboraha Sprang, MD;  Location: [facility];  Service: Cardiovascular;  Laterality: N/A;   LUMBAR LAMINECTOMY/DECOMPRESSION MICRODISCECTOMY N/A 02/06/2021   Procedure: Lumbar Three-Four, Lumbar Four-Five Laminectomy and Foraminotomy;  Surgeon: Kristeen Miss, MD;  Location: [facility];  Service: Neurosurgery;  Laterality: N/A;   SHOULDER OPEN ROTATOR CUFF REPAIR Right 2001   TONSILLECTOMY  ~ [place] ARTHROPLASTY Left 2008   TOTAL KNEE ARTHROPLASTY Right 07/03/2016   Procedure: TOTAL KNEE ARTHROPLASTY;  Surgeon: Hessie Knows, MD;  Location: [facility];  Service: Orthopedics;  Laterality: Right;   TUBAL LIGATION  1973   VAGINAL HYSTERECTOMY  1974    Medical History: Past Medical History:  Diagnosis Date   Anxiety    Arthritis    "shoulders" (08/20/2014)   Asthma    Chronic lower back pain    Complication of anesthesia    "agitated & restless after knee replacement in 2008"   COPD (chronic obstructive pulmonary disease) (HCC)    DDD (degenerative disc disease), lumbar    Depression    Diabetes mellitus without complication (HCC)    Diverticulosis    Family history of adverse reaction to anesthesia    GERD  (gastroesophageal reflux disease)    High cholesterol    Hypertension    Hypothyroidism    Memory loss    mild   On home oxygen therapy    "2L at night" (08/20/2014)   PAF (paroxysmal atrial fibrillation) (HCC)    Peripheral vascular disease (HCC)    Pre-diabetes    Shortness of breath dyspnea    with exertion   Stroke ([place])    mild, no deficits    Family History: Family History  Problem Relation Age of Onset   Diabetes Mother    Hypertension Mother    Lung cancer Mother    Congestive Heart Failure Father    Congestive Heart Failure Sister     Social History   Socioeconomic History   Marital status: Divorced    Spouse name: Not on file   Number of children: Not on file   Years of education: Not on file   Highest education level: Not on file  Occupational History   Not on  file  Tobacco Use   Smoking status: Some Days    Packs/day: 0.25    Years: 53.00    Pack years: 13.25    Types: Cigarettes   Smokeless tobacco: Never   Tobacco comments:    3 cigarettes in week  Vaping Use   Vaping Use: Never used  Substance and Sexual Activity   Alcohol use: No   Drug use: No   Sexual activity: Not Currently  Other Topics Concern   Not on file  Social History Narrative   Not on file   Social Determinants of Health   Financial Resource Strain: Not on file  Food Insecurity: Not on file  Transportation Needs: Not on file  Physical Activity: Not on file  Stress: Not on file  Social Connections: Not on file  Intimate Partner Violence: Not on file      Review of Systems  Constitutional:  Negative for activity change, appetite change, chills, fatigue, fever and unexpected weight change.  HENT: Negative.  Negative for congestion, ear pain, rhinorrhea, sore throat and trouble swallowing.   Eyes: Negative.   Respiratory: Negative.  Negative for cough, chest tightness, shortness of breath and wheezing.   Cardiovascular: Negative.  Negative for chest pain.   Gastrointestinal: Negative.  Negative for abdominal pain, blood in stool, constipation, diarrhea, nausea and vomiting.  Endocrine: Negative.   Genitourinary: Negative.  Negative for difficulty urinating, dysuria, frequency, hematuria and urgency.  Musculoskeletal: Negative.  Negative for arthralgias, back pain, joint swelling, myalgias and neck pain.  Skin: Negative.  Negative for rash and wound.  Allergic/Immunologic: Negative.  Negative for immunocompromised state.  Neurological: Negative.  Negative for dizziness, seizures, numbness and headaches.  Hematological: Negative.   Psychiatric/Behavioral: Negative.  Negative for behavioral problems, self-injury and suicidal ideas. The patient is not nervous/anxious.    Vital Signs: BP (!) 192/82   Pulse 70   Temp (!) 97.1 F (36.2 C)   Resp 16   Ht '5\' 4"'$  (1.626 m)   Wt 221 lb (100.2 kg)   SpO2 97%   BMI 37.93 kg/m    Physical Exam Vitals reviewed.  Constitutional:      General: She is awake. She is not in acute distress.    Appearance: Normal appearance. She is well-developed and well-groomed. She is not diaphoretic.  HENT:     Head: Normocephalic and atraumatic.     Right Ear: Tympanic membrane, ear canal and external ear normal.     Left Ear: Tympanic membrane, ear canal and external ear normal.     Nose: Nose normal. No congestion or rhinorrhea.     Mouth/Throat:     Mouth: Mucous membranes are moist.     Pharynx: Oropharynx is clear. No oropharyngeal exudate or posterior oropharyngeal erythema.  Eyes:     General: Lids are normal. Vision grossly intact. Gaze aligned appropriately. No scleral icterus.       Right eye: No discharge.        Left eye: No discharge.     Extraocular Movements: Extraocular movements intact.     Conjunctiva/sclera: Conjunctivae normal.     Pupils: Pupils are equal, round, and reactive to light.     Funduscopic exam:    Right eye: Red reflex present.        Left eye: Red reflex present. Neck:      Thyroid: No thyromegaly.     Vascular: No JVD.     Trachea: Trachea and phonation normal. No tracheal deviation.  Cardiovascular:  Rate and Rhythm: Normal rate and regular rhythm.     Pulses:          Carotid pulses are 3+ on the right side and 3+ on the left side.      Radial pulses are 2+ on the right side and 2+ on the left side.       Dorsalis pedis pulses are 2+ on the right side and 2+ on the left side.     Heart sounds: Normal heart sounds, S1 normal and S2 normal. No murmur heard.   No friction rub. No gallop.  Pulmonary:     Effort: Pulmonary effort is normal. No accessory muscle usage or respiratory distress.     Breath sounds: Normal breath sounds and air entry. No stridor. No wheezing or rales.  Chest:     Chest wall: No tenderness.  Abdominal:     General: Bowel sounds are normal. There is no distension.     Palpations: Abdomen is soft. There is no shifting dullness, fluid wave, mass or pulsatile mass.     Tenderness: There is no abdominal tenderness. There is no guarding or rebound.  Musculoskeletal:        General: No tenderness or deformity. Normal range of motion.     Cervical back: Normal range of motion and neck supple.     Right lower leg: 1+ Edema present.     Left lower leg: 1+ Edema present.  Lymphadenopathy:     Cervical: No cervical adenopathy.  Skin:    General: Skin is warm and dry.     Capillary Refill: Capillary refill takes less than 2 seconds.     Coloration: Skin is not pale.     Findings: No erythema or rash.  Neurological:     Mental Status: She is alert and oriented to person, place, and time.     Cranial Nerves: No cranial nerve deficit.     Motor: No abnormal muscle tone.     Coordination: Coordination normal.     Deep Tendon Reflexes: Reflexes are normal and symmetric.  Psychiatric:        Mood and Affect: Mood normal.        Behavior: Behavior normal. Behavior is cooperative.        Thought Content: Thought content normal.         Judgment: Judgment normal.       Assessment/Plan: 1. Encounter for general adult medical examination with abnormal findings Age-appropriate preventive screenings and vaccinations discussed, annual physical exam completed. Routine labs for health maintenance ordered see below. PHM updated.    2. Essential hypertension Not well controlled, start amlodipine 5 mg daily. Follow up in 4 weeks. - amLODipine (NORVASC) 5 MG tablet; Take 1 tablet (5 mg total) by mouth daily.  Dispense: 90 tablet; Refill: 0  3. Mixed hyperlipidemia Taking atorvastatin 80 mg daily. Recheck lipid panel - Lipid Profile  4. Vitamin D deficiency Rule out low vitamin D - Vitamin D (25 hydroxy)  5. Other fatigue Labs ordered - TSH + free T4 - Comprehensive Metabolic Panel (CMET)  6. Dysuria Routine urinalysis done.  - UA/M w/rflx Culture, Routine - Microscopic Examination - Urine Culture, Reflex  7. Need for vaccination - Zoster Vaccine Adjuvanted [facility]) injection; Inject 0.5 mLs into the muscle once for 1 dose.  Dispense: 0.5 mL; Refill: 0 - pneumococcal 20-Val Conj Vacc (PREVNAR 20) 0.5 ML injection; Inject 0.5 mLs into the muscle tomorrow at 10 am for 1 dose.  Dispense: 0.5  mL; Refill: 0  8. Atrial Fibrillation  Followed by Cardiology, on Eliquis   9. TIA due to embolism (HCC) No residual deficit however did  have moderate plaque in her carotid artery ( left)   10. Lumbar central spinal stenosis (Moderate: L2-3  Severe: L3-4, L4-5) w/ neurogenic claudication Recent decompression, followed by neurosurgery      General Counseling: Sandy Salaam understanding of the findings of todays visit and agrees with plan of treatment. I have discussed any further diagnostic evaluation that may be needed or ordered today. We also reviewed her medications today. she has been encouraged to call the office with any questions or concerns that should arise related to todays visit.    Orders Placed This  Encounter  Procedures   Microscopic Examination   Urine Culture, Reflex   UA/M w/rflx Culture, Routine   Lipid Profile   Vitamin D (25 hydroxy)   TSH + free T4   Comprehensive Metabolic Panel (CMET)    Meds ordered this encounter  Medications   Zoster Vaccine Adjuvanted [facility]) injection    Sig: Inject 0.5 mLs into the muscle once for 1 dose.    Dispense:  0.5 mL    Refill:  0   pneumococcal 20-Val Conj Vacc (PREVNAR 20) 0.5 ML injection    Sig: Inject 0.5 mLs into the muscle tomorrow at 10 am for 1 dose.    Dispense:  0.5 mL    Refill:  0   amLODipine (NORVASC) 5 MG tablet    Sig: Take 1 tablet (5 mg total) by mouth daily.    Dispense:  90 tablet    Refill:  0    Return in about 4 weeks (around 05/08/2021) for F/U, BP check, Burnell Matlin PCP.   Total time spent:30 Minutes Time spent includes review of chart, medications, test results, and follow up plan with the patient.   [place] Controlled Substance Database was reviewed by me.  This patient was seen by Jonetta Osgood, FNP-C in collaboration with Dr. Clayborn Bigness as a part of collaborative care agreement.  Alesa Echevarria R. Valetta Fuller, MSN, FNP-C Internal medicine

## 2021-04-13 LAB — UA/M W/RFLX CULTURE, ROUTINE
Bilirubin, UA: NEGATIVE
Glucose, UA: NEGATIVE
Ketones, UA: NEGATIVE
Leukocytes,UA: NEGATIVE
Nitrite, UA: NEGATIVE
Protein,UA: NEGATIVE
Specific Gravity, UA: 1.013 (ref 1.005–1.030)
Urobilinogen, Ur: 0.2 mg/dL (ref 0.2–1.0)
pH, UA: 5.5 (ref 5.0–7.5)

## 2021-04-13 LAB — MICROSCOPIC EXAMINATION: Casts: NONE SEEN /lpf

## 2021-04-13 LAB — URINE CULTURE, REFLEX

## 2021-04-13 NOTE — Telephone Encounter (Signed)
error 

## 2021-04-22 NOTE — Progress Notes (Signed)
Carelink Summary Report / Loop Recorder 

## 2021-05-02 ENCOUNTER — Emergency Department: Payer: Medicare Other

## 2021-05-02 ENCOUNTER — Other Ambulatory Visit: Payer: Self-pay

## 2021-05-02 ENCOUNTER — Observation Stay
Admission: EM | Admit: 2021-05-02 | Discharge: 2021-05-03 | Disposition: A | Discharge status: Home / Self Care | Payer: Medicare Other | Attending: Internal Medicine | Admitting: Internal Medicine

## 2021-05-02 DIAGNOSIS — R0602 Shortness of breath: Secondary | ICD-10-CM | POA: Insufficient documentation

## 2021-05-02 DIAGNOSIS — R778 Other specified abnormalities of plasma proteins: Secondary | ICD-10-CM

## 2021-05-02 DIAGNOSIS — E785 Hyperlipidemia, unspecified: Secondary | ICD-10-CM

## 2021-05-02 DIAGNOSIS — Z8673 Personal history of transient ischemic attack (TIA), and cerebral infarction without residual deficits: Secondary | ICD-10-CM | POA: Diagnosis not present

## 2021-05-02 DIAGNOSIS — E119 Type 2 diabetes mellitus without complications: Secondary | ICD-10-CM | POA: Diagnosis not present

## 2021-05-02 DIAGNOSIS — I471 Supraventricular tachycardia: Secondary | ICD-10-CM | POA: Diagnosis not present

## 2021-05-02 DIAGNOSIS — J449 Chronic obstructive pulmonary disease, unspecified: Secondary | ICD-10-CM | POA: Insufficient documentation

## 2021-05-02 DIAGNOSIS — Z96653 Presence of artificial knee joint, bilateral: Secondary | ICD-10-CM | POA: Insufficient documentation

## 2021-05-02 DIAGNOSIS — Z79899 Other long term (current) drug therapy: Secondary | ICD-10-CM | POA: Diagnosis not present

## 2021-05-02 DIAGNOSIS — R55 Syncope and collapse: Secondary | ICD-10-CM | POA: Diagnosis not present

## 2021-05-02 DIAGNOSIS — R079 Chest pain, unspecified: Secondary | ICD-10-CM | POA: Diagnosis present

## 2021-05-02 DIAGNOSIS — I491 Atrial premature depolarization: Secondary | ICD-10-CM | POA: Diagnosis not present

## 2021-05-02 DIAGNOSIS — E039 Hypothyroidism, unspecified: Secondary | ICD-10-CM | POA: Insufficient documentation

## 2021-05-02 DIAGNOSIS — I10 Essential (primary) hypertension: Secondary | ICD-10-CM

## 2021-05-02 DIAGNOSIS — R0902 Hypoxemia: Secondary | ICD-10-CM | POA: Diagnosis not present

## 2021-05-02 DIAGNOSIS — R002 Palpitations: Secondary | ICD-10-CM

## 2021-05-02 DIAGNOSIS — I214 Non-ST elevation (NSTEMI) myocardial infarction: Secondary | ICD-10-CM | POA: Diagnosis not present

## 2021-05-02 DIAGNOSIS — F17210 Nicotine dependence, cigarettes, uncomplicated: Secondary | ICD-10-CM | POA: Diagnosis not present

## 2021-05-02 DIAGNOSIS — G4489 Other headache syndrome: Secondary | ICD-10-CM | POA: Diagnosis not present

## 2021-05-02 DIAGNOSIS — R0689 Other abnormalities of breathing: Secondary | ICD-10-CM | POA: Diagnosis not present

## 2021-05-02 DIAGNOSIS — Z20822 Contact with and (suspected) exposure to covid-19: Secondary | ICD-10-CM | POA: Diagnosis not present

## 2021-05-02 DIAGNOSIS — I480 Paroxysmal atrial fibrillation: Secondary | ICD-10-CM

## 2021-05-02 DIAGNOSIS — R0789 Other chest pain: Secondary | ICD-10-CM | POA: Diagnosis present

## 2021-05-02 DIAGNOSIS — F0390 Unspecified dementia without behavioral disturbance: Secondary | ICD-10-CM | POA: Diagnosis not present

## 2021-05-02 DIAGNOSIS — R42 Dizziness and giddiness: Secondary | ICD-10-CM | POA: Diagnosis not present

## 2021-05-02 LAB — CBC
HCT: 41.7 % (ref 36.0–46.0)
Hemoglobin: 13.4 g/dL (ref 12.0–15.0)
MCH: 30 pg (ref 26.0–34.0)
MCHC: 32.1 g/dL (ref 30.0–36.0)
MCV: 93.3 fL (ref 80.0–100.0)
Platelets: 256 10*3/uL (ref 150–400)
RBC: 4.47 MIL/uL (ref 3.87–5.11)
RDW: 13.3 % (ref 11.5–15.5)
WBC: 8.3 10*3/uL (ref 4.0–10.5)
nRBC: 0 % (ref 0.0–0.2)

## 2021-05-02 LAB — BASIC METABOLIC PANEL
Anion gap: 13 (ref 5–15)
BUN: 14 mg/dL (ref 8–23)
CO2: 23 mmol/L (ref 22–32)
Calcium: 9.2 mg/dL (ref 8.9–10.3)
Chloride: 108 mmol/L (ref 98–111)
Creatinine, Ser: 0.94 mg/dL (ref 0.44–1.00)
GFR, Estimated: >60 mL/min (ref >60)
Glucose, Bld: 129 mg/dL — ABNORMAL HIGH (ref 70–99)
Potassium: 4.7 mmol/L (ref 3.5–5.1)
Sodium: 144 mmol/L (ref 135–145)

## 2021-05-02 LAB — TROPONIN I (HIGH SENSITIVITY): Troponin I (High Sensitivity): 22 ng/L — ABNORMAL HIGH (ref <18)

## 2021-05-02 LAB — BRAIN NATRIURETIC PEPTIDE: B Natriuretic Peptide: 133.4 pg/mL — ABNORMAL HIGH (ref 0.0–100.0)

## 2021-05-02 MED ORDER — METOPROLOL TARTRATE 25 MG PO TABS
12.5000 mg | ORAL_TABLET | Freq: Once | ORAL | Status: AC
  Administered 2021-05-02: 12.5 mg via ORAL
  Filled 2021-05-02: qty 1

## 2021-05-02 MED ORDER — GABAPENTIN 100 MG PO CAPS
100.0000 mg | ORAL_CAPSULE | Freq: Three times a day (TID) | ORAL | Status: DC
  Administered 2021-05-03: 100 mg via ORAL
  Filled 2021-05-02: qty 1

## 2021-05-02 MED ORDER — LOSARTAN POTASSIUM 25 MG PO TABS
25.0000 mg | ORAL_TABLET | Freq: Every day | ORAL | Status: DC
  Administered 2021-05-03: 25 mg via ORAL
  Filled 2021-05-02: qty 1

## 2021-05-02 MED ORDER — ATORVASTATIN CALCIUM 80 MG PO TABS: 80.0000 mg | ORAL_TABLET | Freq: Every day | ORAL | Status: DC

## 2021-05-02 MED ORDER — LEVOTHYROXINE SODIUM 50 MCG PO TABS
75.0000 ug | ORAL_TABLET | Freq: Every day | ORAL | Status: DC
  Administered 2021-05-03: 75 ug via ORAL
  Filled 2021-05-02: qty 1

## 2021-05-02 MED ORDER — VENLAFAXINE HCL ER 75 MG PO CP24
150.0000 mg | ORAL_CAPSULE | Freq: Every day | ORAL | Status: DC
  Administered 2021-05-03: 150 mg via ORAL
  Filled 2021-05-02: qty 2

## 2021-05-02 MED ORDER — SODIUM CHLORIDE 0.9 % IV SOLN: INTRAVENOUS | Status: DC

## 2021-05-02 MED ORDER — MAGNESIUM HYDROXIDE 400 MG/5ML PO SUSP: 30.0000 mL | Freq: Every day | ORAL | Status: DC | PRN

## 2021-05-02 MED ORDER — METOPROLOL SUCCINATE ER 100 MG PO TB24
100.0000 mg | ORAL_TABLET | Freq: Every day | ORAL | Status: DC
  Administered 2021-05-03: 100 mg via ORAL
  Filled 2021-05-02: qty 1

## 2021-05-02 MED ORDER — CYCLOBENZAPRINE HCL 10 MG PO TABS: 5.0000 mg | ORAL_TABLET | Freq: Every evening | ORAL | Status: DC | PRN

## 2021-05-02 MED ORDER — ONDANSETRON HCL 4 MG/2ML IJ SOLN: 4.0000 mg | Freq: Four times a day (QID) | INTRAMUSCULAR | Status: DC | PRN

## 2021-05-02 MED ORDER — VITAMIN D (ERGOCALCIFEROL) 1.25 MG (50000 UNIT) PO CAPS: 50000.0000 [IU] | ORAL_CAPSULE | ORAL | Status: DC

## 2021-05-02 MED ORDER — DOCUSATE SODIUM 100 MG PO CAPS: 100.0000 mg | ORAL_CAPSULE | Freq: Every day | ORAL | Status: DC | PRN

## 2021-05-02 MED ORDER — APIXABAN 5 MG PO TABS: 5.0000 mg | ORAL_TABLET | Freq: Two times a day (BID) | ORAL | Status: DC

## 2021-05-02 MED ORDER — TRAZODONE HCL 50 MG PO TABS: 25.0000 mg | ORAL_TABLET | Freq: Every evening | ORAL | Status: DC | PRN

## 2021-05-02 MED ORDER — ALBUTEROL SULFATE (2.5 MG/3ML) 0.083% IN NEBU: 3.0000 mL | INHALATION_SOLUTION | Freq: Four times a day (QID) | RESPIRATORY_TRACT | Status: DC | PRN

## 2021-05-02 MED ORDER — ACETAMINOPHEN 325 MG RE SUPP
650.0000 mg | Freq: Four times a day (QID) | RECTAL | Status: DC | PRN
  Filled 2021-05-02: qty 2

## 2021-05-02 MED ORDER — METHOCARBAMOL 500 MG PO TABS
500.0000 mg | ORAL_TABLET | Freq: Four times a day (QID) | ORAL | Status: DC | PRN
  Filled 2021-05-02: qty 1

## 2021-05-02 MED ORDER — ENOXAPARIN SODIUM 40 MG/0.4ML IJ SOSY: 40.0000 mg | PREFILLED_SYRINGE | INTRAMUSCULAR | Status: DC

## 2021-05-02 MED ORDER — ACETAMINOPHEN 325 MG PO TABS: 650.0000 mg | ORAL_TABLET | Freq: Four times a day (QID) | ORAL | Status: DC | PRN

## 2021-05-02 MED ORDER — DONEPEZIL HCL 5 MG PO TABS
5.0000 mg | ORAL_TABLET | Freq: Every day | ORAL | Status: DC
  Administered 2021-05-03: 5 mg via ORAL
  Filled 2021-05-02 (×2): qty 1

## 2021-05-02 MED ORDER — AMLODIPINE BESYLATE 5 MG PO TABS
5.0000 mg | ORAL_TABLET | Freq: Every day | ORAL | Status: DC
  Administered 2021-05-03: 5 mg via ORAL
  Filled 2021-05-02: qty 1

## 2021-05-02 MED ORDER — ONDANSETRON HCL 4 MG PO TABS: 4.0000 mg | ORAL_TABLET | Freq: Four times a day (QID) | ORAL | Status: DC | PRN

## 2021-05-02 NOTE — H&P (Signed)
[place]   PATIENT NAME: [name] Douglas    MR#:  [id number]  DATE OF BIRTH:  06-12-1941  DATE OF ADMISSION:  05/02/2021  PRIMARY CARE PHYSICIAN: Lavera Guise, MD   Patient is coming from: Home  REQUESTING/REFERRING PHYSICIAN: Merlyn Lot, MD  CHIEF COMPLAINT:   Chief Complaint  Patient presents with   Dizziness    SVT on scene    HISTORY OF PRESENT ILLNESS:  [name] Douglas is a 80 y.o. female with medical history significant for COPD, type 2 diabetes mellitus, GERD, hypertension, dyslipidemia and hypothyroidism, as well as paroxysmal atrial fibrillation and PVD, who presented to the ER with acute onset of dizziness with associated nausea without vomiting and chest tightness with associated palpitations.  She graded her chest tightness is 4-5/10 in severity without radiation but admitted to associated dyspnea with no diaphoresis.  She denies any leg pain or edema or recent travels or surgeries.  No cough or wheezing or hemoptysis.  No bleeding diathesis.  No dysuria, oliguria or hematuria or flank pain.  She was noted to have SVT by EMS with a rate in the 170s for which she was given adenosine with successful conversion to normal sinus rhythm. ED Course: When she came to ER blood pressure was 168/88 and heart rate was 98 with otherwise normal vital signs.  Labs revealed BNP of 133.4 and high-sensitivity troponin I of 22 and later 111 with normal BMP and normal CBC.  Influenza antigens and COVID-19 PCR came back negative.   EKG as reviewed by me : Sinus rhythm with a rate of 94 with prolonged PR interval and low voltage. Imaging: Two-view chest x-ray showed cardiomegaly and central vascular congestion with no overt edema.  The patient was given 12.5 mg p.o. Lopressor.  With her second troponin I started on IV heparin drip.  She was also given aspirin as well as high-dose statin.  She was pain-free in the ER.  She will be admitted to a progressive unit bed for further  evaluation and management. PAST MEDICAL HISTORY:   Past Medical History:  Diagnosis Date   Anxiety    Arthritis    "shoulders" (08/20/2014)   Asthma    Chronic lower back pain    Complication of anesthesia    "agitated & restless after knee replacement in 2008"   COPD (chronic obstructive pulmonary disease) (HCC)    DDD (degenerative disc disease), lumbar    Depression    Diabetes mellitus without complication ([place])    Diverticulosis    Family history of adverse reaction to anesthesia    GERD (gastroesophageal reflux disease)    High cholesterol    Hypertension    Hypothyroidism    Memory loss    mild   On home oxygen therapy    "2L at night" (08/20/2014)   PAF (paroxysmal atrial fibrillation) (HCC)    Peripheral vascular disease (HCC)    Pre-diabetes    Shortness of breath dyspnea    with exertion   Stroke ([place])    mild, no deficits    PAST SURGICAL HISTORY:   Past Surgical History:  Procedure Laterality Date   CARDIAC CATHETERIZATION  ~ 2007   EXCISIONAL HEMORRHOIDECTOMY  1960's   EYE SURGERY Bilateral    Cataract extraction with IOL   KNEE ARTHROSCOPY Left X 2 <2008   KNEE ARTHROSCOPY WITH SUBCHONDROPLASTY Right 04/19/2016   Procedure: KNEE ARTHROSCOPY WITH SUBCHONDROPLASTY, PARTIAL MENISCECTOMY;  Surgeon: Hessie Knows, MD;  Location: [facility];  Service: Orthopedics;  Laterality: Right;   LOOP RECORDER INSERTION N/A 01/02/2018   Procedure: LOOP RECORDER INSERTION;  Surgeon: Deboraha Sprang, MD;  Location: [facility];  Service: Cardiovascular;  Laterality: N/A;   LUMBAR LAMINECTOMY/DECOMPRESSION MICRODISCECTOMY N/A 02/06/2021   Procedure: Lumbar Three-Four, Lumbar Four-Five Laminectomy and Foraminotomy;  Surgeon: Kristeen Miss, MD;  Location: [facility];  Service: Neurosurgery;  Laterality: N/A;   SHOULDER OPEN ROTATOR CUFF REPAIR Right 2001   TONSILLECTOMY  ~ [place] ARTHROPLASTY Left 2008   TOTAL KNEE ARTHROPLASTY Right 07/03/2016   Procedure: TOTAL  KNEE ARTHROPLASTY;  Surgeon: Hessie Knows, MD;  Location: [facility] ORS;  Service: Orthopedics;  Laterality: Right;   TUBAL LIGATION  1973   VAGINAL HYSTERECTOMY  1974    SOCIAL HISTORY:   Social History   Tobacco Use   Smoking status: Some Days    Packs/day: 0.25    Years: 53.00    Pack years: 13.25    Types: Cigarettes   Smokeless tobacco: Never   Tobacco comments:    3 cigarettes in week  Substance Use Topics   Alcohol use: No    FAMILY HISTORY:   Family History  Problem Relation Age of Onset   Diabetes Mother    Hypertension Mother    Lung cancer Mother    Congestive Heart Failure Father    Congestive Heart Failure Sister     DRUG ALLERGIES:   Allergies  Allergen Reactions   Heparin Nausea And Vomiting    REVIEW OF SYSTEMS:   ROS As per history of present illness. All pertinent systems were reviewed above. Constitutional, HEENT, cardiovascular, respiratory, GI, GU, musculoskeletal, neuro, psychiatric, endocrine, integumentary and hematologic systems were reviewed and are otherwise negative/unremarkable except for positive findings mentioned above in the HPI.   MEDICATIONS AT HOME:   Prior to Admission medications   Medication Sig Start Date End Date Taking? Authorizing Provider  albuterol (VENTOLIN HFA) 108 (90 Base) MCG/ACT inhaler Inhale 2 puffs into the lungs every 6 (six) hours as needed for wheezing or shortness of breath. 08/05/20   Shelly Coss, MD  amLODipine (NORVASC) 5 MG tablet Take 1 tablet (5 mg total) by mouth daily. 04/10/21   Jonetta Osgood, NP  apixaban (ELIQUIS) 5 MG TABS tablet Take 1 tablet (5 mg total) by mouth 2 (two) times daily. 09/13/20   Deboraha Sprang, MD  Aspirin-Acetaminophen-Caffeine (GOODY HEADACHE PO) Take 1 Package by mouth daily as needed (headache).    [provider]  atorvastatin (LIPITOR) 80 MG tablet Take 1 tablet (80 mg total) by mouth daily at 6 PM. 08/22/20   Ronnell Freshwater, NP  cyclobenzaprine (FLEXERIL) 5  MG tablet TAKE 1 TABLET BY MOUTH AT BEDTIME 12/05/20   Lavera Guise, MD  docusate sodium (COLACE) 100 MG capsule Take 100 mg by mouth daily as needed for mild constipation.     [provider]  donepezil (ARICEPT) 5 MG tablet Take 1 tablet (5 mg total) by mouth at bedtime. 04/28/20   Ronnell Freshwater, NP  gabapentin (NEURONTIN) 100 MG capsule Take 200 mg by mouth 3 (three) times daily. 06/24/20   [provider]  levothyroxine (SYNTHROID) 75 MCG tablet Take 1 tablet (75 mcg total) by mouth daily before breakfast. 12/01/20   Lavera Guise, MD  losartan (COZAAR) 25 MG tablet TAKE 1 TABLET BY MOUTH DAILY 03/01/21   Lavera Guise, MD  methocarbamol (ROBAXIN) 500 MG tablet Take 1 tablet (500  mg total) by mouth every 6 (six) hours as needed for muscle spasms. 02/08/21   Kristeen Miss, MD  methylPREDNISolone (MEDROL DOSEPAK) 4 MG TBPK tablet Follow package insert 01/06/21   Curatolo, Adam, DO  metoprolol succinate (TOPROL-XL) 50 MG 24 hr tablet Take 2 tablets (100 mg total) by mouth daily. 09/28/20   Lavera Guise, MD  oxyCODONE (ROXICODONE) 5 MG immediate release tablet Take 1 tablet (5 mg total) by mouth every 8 (eight) hours as needed for up to 20 doses for breakthrough pain. 01/06/21   Curatolo, Adam, DO  oxyCODONE-acetaminophen (PERCOCET/ROXICET) 5-325 MG tablet Take 1-2 tablets by mouth every 4 (four) hours as needed for moderate pain or severe pain. 02/08/21   Kristeen Miss, MD  predniSONE (DELTASONE) 5 MG tablet Take by mouth. 04/05/21   [provider]  traMADol (ULTRAM) 50 MG tablet Take 50 mg by mouth every 6 (six) hours as needed for pain. 01/26/21   [provider]  venlafaxine XR (EFFEXOR-XR) 75 MG 24 hr capsule TAKE 2 CAPSULES BY MOUTH DAILY. 09/28/20   Lavera Guise, MD  Vitamin D, Ergocalciferol, (DRISDOL) 1.25 MG (50000 UNIT) CAPS capsule TAKE 1 CAPSULE BY MOUTH EVERY FRIDAY. 01/03/21   Lavera Guise, MD      VITAL SIGNS:  Blood pressure (!) 127/103, pulse 70,  temperature 97.8 F (36.6 C), temperature source Oral, resp. rate 16, height '5\' 4"'$  (1.626 m), weight 100.8 kg, SpO2 96 %.  PHYSICAL EXAMINATION:  Physical Exam  GENERAL:  80 y.o.-year-old Caucasian female patient lying in the bed with no acute distress.  EYES: Pupils equal, round, reactive to light and accommodation. No scleral icterus. Extraocular muscles intact.  HEENT: Head atraumatic, normocephalic. Oropharynx and nasopharynx clear.  NECK:  Supple, no jugular venous distention. No thyroid enlargement, no tenderness.  LUNGS: Normal breath sounds bilaterally, no wheezing, rales,rhonchi or crepitation. No use of accessory muscles of respiration.  CARDIOVASCULAR: Regular rate and rhythm, S1, S2 normal. No murmurs, rubs, or gallops.  ABDOMEN: Soft, nondistended, nontender. Bowel sounds present. No organomegaly or mass.  EXTREMITIES: No pedal edema, cyanosis, or clubbing.  NEUROLOGIC: Cranial nerves II through XII are intact. Muscle strength 5/5 in all extremities. Sensation intact. Gait not checked.  PSYCHIATRIC: The patient is alert and oriented x 3.  Normal affect and good eye contact. SKIN: No obvious rash, lesion, or ulcer.   LABORATORY PANEL:   CBC Recent Labs  Lab 05/02/21 2201  WBC 8.3  HGB 13.4  HCT 41.7  PLT 256   ------------------------------------------------------------------------------------------------------------------  Chemistries  Recent Labs  Lab 05/02/21 2201  NA 144  K 4.7  CL 108  CO2 23  GLUCOSE 129*  BUN 14  CREATININE 0.94  CALCIUM 9.2   ------------------------------------------------------------------------------------------------------------------  Cardiac Enzymes No results for input(s): TROPONINI in the last 168 hours. ------------------------------------------------------------------------------------------------------------------  RADIOLOGY:  DG Chest 2 View  Result Date: 05/02/2021 CLINICAL DATA:  Dizziness, near syncope, SVT EXAM:  CHEST - 2 VIEW COMPARISON:  10/12/2020 FINDINGS: Frontal and lateral views of the chest demonstrate mild enlargement the cardiac silhouette. There is central vascular congestion without airspace disease, effusion, or pneumothorax. No acute bony abnormalities. IMPRESSION: 1. Enlarged cardiac silhouette with central vascular congestion. No overt edema. Electronically Signed   By: Randa Ngo M.D.   On: 05/02/2021 22:24      IMPRESSION AND PLAN:  Active Problems:   Chest pain  1.  Chest pain with associated elevated troponin I concerning for non-STEMI and associated SVT status post cardioversion  with adenosine. - The patient be admitted to a progressive unit bed. - We will follow serial troponin I's. - She will be placed on aspirin, as needed sublingual nitroglycerin and morphine sulfate for pain. - We will continue her on IV heparin and high-dose statin therapy. - 2D echo and cardiology consult will be obtained. - I notified Dr. Acie Fredrickson about the patient.  2.  Essential hypertension - We will continue amlodipine, Toprol-XL and Cozaar.  3.  Dyslipidemia. - We will continue statin therapy.  4.  Hypothyroidism. - We will continue Synthroid.  5.  Dementia. - We will continue Aricept.  6.  Depression. - We will continue Effexor XR.  DVT prophylaxis: IV heparin. Code Status: full code.  Family Communication:  The plan of care was discussed in details with the patient (and family). I answered all questions. The patient agreed to proceed with the above mentioned plan. Further management will depend upon hospital course. Disposition Plan: Back to previous home environment Consults called: Cardiology consult.   All the records are reviewed and case discussed with ED provider.  Status is: Observation  The patient remains OBS appropriate and will d/c before 2 midnights.  Dispo: The patient is from: Home              Anticipated d/c is to: Home              Patient currently is not  medically stable to d/c.   Difficult to place patient No      TOTAL TIME TAKING CARE OF THIS PATIENT: 55 minutes.    Christel Mormon M.D on 05/02/2021 at 11:55 PM  [facility]   From 7 PM-7 AM, contact night-coverage [email or web address]  CC: Primary care physician; Lavera Guise, MD

## 2021-05-02 NOTE — ED Triage Notes (Signed)
Pt family called ems. Pt complains dizziness and near syncope. Ems treated SVT rate of 170 with 6 of adenosine and 500 ml fluids. Never had hx of svt prior to today.complains of ha

## 2021-05-02 NOTE — ED Notes (Signed)
Pt stated that the temp probe caused her to cough excessively. Coughing lasted approx 1 min. Able to maintain airway and stop on her coughing on her own.

## 2021-05-02 NOTE — ED Provider Notes (Signed)
[facility] Emergency Department Provider Note    Event Date/Time   First MD Initiated Contact with Patient 05/02/21 2202     (approximate)  I have reviewed the triage vital signs and the nursing notes.   HISTORY  Chief Complaint Dizziness (SVT on scene)    HPI [name] Douglas is a 80 y.o. female below listed past medical history presents to the ER for evaluation of midsternal chest pressure palpitations nausea and feeling like she was about to pass out and occurred several hours after dinner this evening.  Has never had symptoms like this before.  EMS was called as she felt like she was about to pass out.  Reportedly was found in SVT with heart rates in the 170s.  Was given 6 mg of adenosine with conversion to sinus rhythm.  Felt improved after that.  Denies any new medications has not missed any medications.  She is on Eliquis.  Denies any shortness of breath at this time.  Past Medical History:  Diagnosis Date   Anxiety    Arthritis    "shoulders" (08/20/2014)   Asthma    Chronic lower back pain    Complication of anesthesia    "agitated & restless after knee replacement in 2008"   COPD (chronic obstructive pulmonary disease) (HCC)    DDD (degenerative disc disease), lumbar    Depression    Diabetes mellitus without complication (HCC)    Diverticulosis    Family history of adverse reaction to anesthesia    GERD (gastroesophageal reflux disease)    High cholesterol    Hypertension    Hypothyroidism    Memory loss    mild   On home oxygen therapy    "2L at night" (08/20/2014)   PAF (paroxysmal atrial fibrillation) (HCC)    Peripheral vascular disease (HCC)    Pre-diabetes    Shortness of breath dyspnea    with exertion   Stroke (HCC)    mild, no deficits   Family History  Problem Relation Age of Onset   Diabetes Mother    Hypertension Mother    Lung cancer Mother    Congestive Heart Failure Father    Congestive Heart Failure Sister     Past Surgical History:  Procedure Laterality Date   CARDIAC CATHETERIZATION  ~ 2007   EXCISIONAL HEMORRHOIDECTOMY  1960's   EYE SURGERY Bilateral    Cataract extraction with IOL   KNEE ARTHROSCOPY Left X 2 <2008   KNEE ARTHROSCOPY WITH SUBCHONDROPLASTY Right 04/19/2016   Procedure: KNEE ARTHROSCOPY WITH SUBCHONDROPLASTY, PARTIAL MENISCECTOMY;  Surgeon: Hessie Knows, MD;  Location: [facility];  Service: Orthopedics;  Laterality: Right;   LOOP RECORDER INSERTION N/A 01/02/2018   Procedure: LOOP RECORDER INSERTION;  Surgeon: Deboraha Sprang, MD;  Location: [place] [facility];  Service: Cardiovascular;  Laterality: N/A;   LUMBAR LAMINECTOMY/DECOMPRESSION MICRODISCECTOMY N/A 02/06/2021   Procedure: Lumbar Three-Four, Lumbar Four-Five Laminectomy and Foraminotomy;  Surgeon: Kristeen Miss, MD;  Location: [place];  Service: Neurosurgery;  Laterality: N/A;   SHOULDER OPEN ROTATOR CUFF REPAIR Right 2001   TONSILLECTOMY  ~ [place] ARTHROPLASTY Left 2008   TOTAL KNEE ARTHROPLASTY Right 07/03/2016   Procedure: TOTAL KNEE ARTHROPLASTY;  Surgeon: Hessie Knows, MD;  Location: [facility];  Service: Orthopedics;  Laterality: Right;   TUBAL LIGATION  1973   VAGINAL HYSTERECTOMY  1974   Patient Active Problem List   Diagnosis Date Noted   Lumbar stenosis with neurogenic claudication  02/06/2021   Compression fracture of fifth lumbar vertebra ([place]) 12/20/2020   DDD (degenerative disc disease), thoracolumbar 11/22/2020   DDD (degenerative disc disease), thoracic 11/10/2020   TIA (transient ischemic attack) 10/12/2020   Left shoulder pain 10/12/2020   Chronic thoracic back pain (Left) 10/10/2020   T12 compression fracture, sequela 10/10/2020   Lumbar foraminal stenosis (Bilateral: L2-3, L3-4, L4-5) (Right: L5-S1) (Severe left L4-5) 09/08/2020   Grade 1 Lumbar Anterolisthesis (L3/4 and L4/5) 09/08/2020   Grade 1 Lumbar Retrolisthesis (L2/3) 09/08/2020   Protrusion of lumbar intervertebral disc  (Multilevel) 09/08/2020   Compression fracture of L1 lumbar vertebra, sequela 09/08/2020   Other intervertebral disc degeneration, lumbar region 09/08/2020   Lumbosacral facet syndrome (Multilevel) (Bilateral) 09/08/2020   Lumbar facet hypertrophy (Multilevel) (Bilateral) 09/08/2020   Ligamentum flavum hypertrophy 09/08/2020   Lumbar lateral recess stenosis (Right: L2-3) 09/08/2020   DDD (degenerative disc disease), lumbosacral 09/08/2020   Compression fracture of L5 vertebra ([place]) 09/08/2020   Osteoporosis with pathological fracture of lumbar vertebra ([place]) 09/08/2020   Vitamin B12 deficiency 09/08/2020   Chronic pain syndrome 09/07/2020   Pharmacologic therapy 09/07/2020   Disorder of skeletal system 09/07/2020   Problems influencing health status [id number]   Uncomplicated opioid dependence ([place]) 09/07/2020   Chronic low back pain (2ry area of Pain) (Bilateral) (R>L) w/ sciatica (Right) 09/07/2020   Chronic lower extremity pain (1ry area of Pain) (Right) 09/07/2020   Chronic lumbar radiculopathy (Right) 09/07/2020   Lumbosacral radiculopathy at L2 (Right) 09/07/2020   Lumbar radiculitis (L2 dermatome) (Right) 09/07/2020   Unspecified inflammatory spondylopathy, lumbar region ([place]) 09/07/2020   Chronic anticoagulation ([place]) 09/07/2020   Abnormal MRI, lumbar spine (04/20/2020) 09/07/2020   Intractable low back pain 08/03/2020   Fall down stairs 08/03/2020   Hemorrhagic stroke ([place]) 08/03/2020   Dizziness and giddiness 07/26/2020   Other chronic pain 07/26/2020   Other sequelae of cerebral infarction 07/18/2020   CVA (cerebral vascular accident) ([place]) 07/14/2020   Blurred vision 07/14/2020   Lumbar central spinal stenosis (Moderate: L2-3  Severe: L3-4, L4-5) w/ neurogenic claudication 05/31/2020   Muscle spasm 04/10/2020   Urinary tract infection with hematuria 04/10/2020   Headache disorder 12/21/2019   History of stroke 12/21/2019   Dizziness 11/06/2019   Atopic  dermatitis 10/28/2019   History of CVA (cerebrovascular accident) 10/07/2019   Intractable episodic cluster headache 09/20/2019   Other symptoms and signs involving the nervous system 09/20/2019   Anxiety disorder, unspecified 08/21/2019   Dependence on supplemental oxygen 08/21/2019   Presence of artificial knee joint, bilateral 08/21/2019   Nicotine dependence, cigarettes, uncomplicated [id number]   Iron deficiency anemia 05/17/2019   Vitamin D deficiency 05/17/2019   Flu vaccine need 05/17/2019   Encounter for general adult medical examination with abnormal findings 04/05/2019   Nausea and vomiting 04/05/2019   Dysuria 04/05/2019   Need for vaccination against Streptococcus pneumoniae using pneumococcal conjugate vaccine 7 04/05/2019   Occlusion and stenosis of bilateral carotid arteries 11/24/2017   Subarachnoid hemorrhage ([place]) 12/09/2016   Solitary pulmonary nodule 08/24/2016   Primary localized osteoarthritis of right knee 07/03/2016   Ileus ([place]) 04/24/2016   Colitis due to Clostridium difficile    Recurrent Clostridium difficile diarrhea    Enteritis due to Clostridium difficile 08/22/2014   Diarrhea 08/20/2014   Essential hypertension 08/20/2014   Hyperlipidemia 08/20/2014   Hypothyroidism 08/20/2014   Tobacco abuse 08/20/2014   COPD (chronic obstructive pulmonary disease) ([place]) 08/20/2014   Acute diarrhea 07/27/2014      Prior  to Admission medications   Medication Sig Start Date End Date Taking? Authorizing Provider  albuterol (VENTOLIN HFA) 108 (90 Base) MCG/ACT inhaler Inhale 2 puffs into the lungs every 6 (six) hours as needed for wheezing or shortness of breath. 08/05/20   Shelly Coss, MD  amLODipine (NORVASC) 5 MG tablet Take 1 tablet (5 mg total) by mouth daily. 04/10/21   Jonetta Osgood, NP  apixaban (ELIQUIS) 5 MG TABS tablet Take 1 tablet (5 mg total) by mouth 2 (two) times daily. 09/13/20   Deboraha Sprang, MD  Aspirin-Acetaminophen-Caffeine (GOODY  HEADACHE PO) Take 1 Package by mouth daily as needed (headache).    [provider]  atorvastatin (LIPITOR) 80 MG tablet Take 1 tablet (80 mg total) by mouth daily at 6 PM. 08/22/20   Ronnell Freshwater, NP  cyclobenzaprine (FLEXERIL) 5 MG tablet TAKE 1 TABLET BY MOUTH AT BEDTIME 12/05/20   Lavera Guise, MD  docusate sodium (COLACE) 100 MG capsule Take 100 mg by mouth daily as needed for mild constipation.     [provider]  donepezil (ARICEPT) 5 MG tablet Take 1 tablet (5 mg total) by mouth at bedtime. 04/28/20   Ronnell Freshwater, NP  gabapentin (NEURONTIN) 100 MG capsule Take 200 mg by mouth 3 (three) times daily. 06/24/20   [provider]  levothyroxine (SYNTHROID) 75 MCG tablet Take 1 tablet (75 mcg total) by mouth daily before breakfast. 12/01/20   Lavera Guise, MD  losartan (COZAAR) 25 MG tablet TAKE 1 TABLET BY MOUTH DAILY 03/01/21   Lavera Guise, MD  methocarbamol (ROBAXIN) 500 MG tablet Take 1 tablet (500 mg total) by mouth every 6 (six) hours as needed for muscle spasms. 02/08/21   Kristeen Miss, MD  methylPREDNISolone (MEDROL DOSEPAK) 4 MG TBPK tablet Follow package insert 01/06/21   Curatolo, Adam, DO  metoprolol succinate (TOPROL-XL) 50 MG 24 hr tablet Take 2 tablets (100 mg total) by mouth daily. 09/28/20   Lavera Guise, MD  oxyCODONE (ROXICODONE) 5 MG immediate release tablet Take 1 tablet (5 mg total) by mouth every 8 (eight) hours as needed for up to 20 doses for breakthrough pain. 01/06/21   Curatolo, Adam, DO  oxyCODONE-acetaminophen (PERCOCET/ROXICET) 5-325 MG tablet Take 1-2 tablets by mouth every 4 (four) hours as needed for moderate pain or severe pain. 02/08/21   Kristeen Miss, MD  predniSONE (DELTASONE) 5 MG tablet Take by mouth. 04/05/21   [provider]  traMADol (ULTRAM) 50 MG tablet Take 50 mg by mouth every 6 (six) hours as needed for pain. 01/26/21   [provider]  venlafaxine XR (EFFEXOR-XR) 75 MG 24 hr capsule TAKE 2 CAPSULES BY  MOUTH DAILY. 09/28/20   Lavera Guise, MD  Vitamin D, Ergocalciferol, (DRISDOL) 1.25 MG (50000 UNIT) CAPS capsule TAKE 1 CAPSULE BY MOUTH EVERY FRIDAY. 01/03/21   Lavera Guise, MD    Allergies Heparin    Social History Social History   Tobacco Use   Smoking status: Some Days    Packs/day: 0.25    Years: 53.00    Pack years: 13.25    Types: Cigarettes   Smokeless tobacco: Never   Tobacco comments:    3 cigarettes in week  Vaping Use   Vaping Use: Never used  Substance Use Topics   Alcohol use: No   Drug use: No    Review of Systems Patient denies headaches, rhinorrhea, blurry vision, numbness, shortness of breath, chest pain, edema, cough, abdominal pain,  nausea, vomiting, diarrhea, dysuria, fevers, rashes or hallucinations unless otherwise stated above in HPI. ____________________________________________   PHYSICAL EXAM:  VITAL SIGNS: Vitals:   05/02/21 2245 05/02/21 2302  BP:  115/63  Pulse: 70 69  Resp: (!) 21   Temp:    SpO2: 93%     Constitutional: Alert and oriented.  Eyes: Conjunctivae are normal.  Head: Atraumatic. Nose: No congestion/rhinnorhea. Mouth/Throat: Mucous membranes are moist.   Neck: No stridor. Painless ROM.  Cardiovascular: Normal rate, regular rhythm. Grossly normal heart sounds.  Good peripheral circulation. Respiratory: Normal respiratory effort.  No retractions. Lungs CTAB. Gastrointestinal: Soft and nontender. No distention. No abdominal bruits. No CVA tenderness. Genitourinary:  Musculoskeletal: No lower extremity tenderness nor edema.  No joint effusions. Neurologic:  Normal speech and language. No gross focal neurologic deficits are appreciated. No facial droop Skin:  Skin is warm, dry and intact. No rash noted. Psychiatric: Mood and affect are normal. Speech and behavior are normal.  ____________________________________________   LABS (all labs ordered are listed, but only abnormal results are displayed)  Results for orders  placed or performed during the hospital encounter of 05/02/21 (from the past 24 hour(s))  Basic metabolic panel     Status: Abnormal   Collection Time: 05/02/21 10:01 PM  Result Value Ref Range   Sodium 144 135 - 145 mmol/L   Potassium 4.7 3.5 - 5.1 mmol/L   Chloride 108 98 - 111 mmol/L   CO2 23 22 - 32 mmol/L   Glucose, Bld 129 (H) 70 - 99 mg/dL   BUN 14 8 - 23 mg/dL   Creatinine, Ser 0.94 0.44 - 1.00 mg/dL   Calcium 9.2 8.9 - 10.3 mg/dL   GFR, Estimated >60 >60 mL/min   Anion gap 13 5 - 15  CBC     Status: None   Collection Time: 05/02/21 10:01 PM  Result Value Ref Range   WBC 8.3 4.0 - 10.5 K/uL   RBC 4.47 3.87 - 5.11 MIL/uL   Hemoglobin 13.4 12.0 - 15.0 g/dL   HCT 41.7 36.0 - 46.0 %   MCV 93.3 80.0 - 100.0 fL   MCH 30.0 26.0 - 34.0 pg   MCHC 32.1 30.0 - 36.0 g/dL   RDW 13.3 11.5 - 15.5 %   Platelets 256 150 - 400 K/uL   nRBC 0.0 0.0 - 0.2 %  Troponin I (High Sensitivity)     Status: Abnormal   Collection Time: 05/02/21 10:01 PM  Result Value Ref Range   Troponin I (High Sensitivity) 22 (H) <18 ng/L   ____________________________________________  EKG My review and personal interpretation at Time: 21:54   Indication: chest pain  Rate: 95  Rhythm: sinus Axis: normal Other: normal intervals, poor r wave progression, nonspecific st abn ____________________________________________  RADIOLOGY  I personally reviewed all radiographic images ordered to evaluate for the above acute complaints and reviewed radiology reports and findings.  These findings were personally discussed with the patient.  Please see medical record for radiology report.  ____________________________________________   PROCEDURES  Procedure(s) performed:  Procedures    Critical Care performed: no ____________________________________________   INITIAL IMPRESSION / ASSESSMENT AND PLAN / ED COURSE  Pertinent labs & imaging results that were available during my care of the patient were reviewed  by me and considered in my medical decision making (see chart for details).   DDX: Dysrhythmia, electrolyte abnormality, CHF, ACS, anemia, medication reaction  JANIRAH REAPE is a 80 y.o. who presents to the ED with  presentation as described above with suspected SVT converted with adenosine prior to hospital arrival.  Currently nontoxic-appearing.  Electrolytes reassuring.  Initial troponin mildly elevated.  Given her age and risk factors she does have history of CVA as well as hypertension high cholesterol and does smoke feel that observation the hospital clinically indicated.  Have given low-dose additional metoprolol.  Have added on BNP as the chest x-ray does show some cardiomegaly and possible cardiovascular congestion however she is not showing signs of lower extremity edema.  Will discuss with hospitalist for admission.     The patient was evaluated in Emergency Department today for the symptoms described in the history of present illness. He/she was evaluated in the context of the global COVID-19 pandemic, which necessitated consideration that the patient might be at risk for infection with the SARS-CoV-2 virus that causes COVID-19. Institutional protocols and algorithms that pertain to the evaluation of patients at risk for COVID-19 are in a state of rapid change based on information released by regulatory bodies including the CDC and federal and state organizations. These policies and algorithms were followed during the patient's care in the ED.  As part of my medical decision making, I reviewed the following data within the [place] notes reviewed and incorporated, Labs reviewed, notes from prior ED visits and [place] Controlled Substance Database   ____________________________________________   FINAL CLINICAL IMPRESSION(S) / ED DIAGNOSES  Final diagnoses:  Palpitations      NEW MEDICATIONS STARTED DURING THIS VISIT:  New Prescriptions   No medications on  file     Note:  This document was prepared using Dragon voice recognition software and may include unintentional dictation errors.    Merlyn Lot, MD 05/02/21 2347

## 2021-05-03 ENCOUNTER — Telehealth: Payer: Self-pay

## 2021-05-03 DIAGNOSIS — I480 Paroxysmal atrial fibrillation: Secondary | ICD-10-CM

## 2021-05-03 DIAGNOSIS — R778 Other specified abnormalities of plasma proteins: Secondary | ICD-10-CM | POA: Diagnosis not present

## 2021-05-03 DIAGNOSIS — R002 Palpitations: Secondary | ICD-10-CM | POA: Diagnosis not present

## 2021-05-03 DIAGNOSIS — I471 Supraventricular tachycardia: Secondary | ICD-10-CM | POA: Diagnosis not present

## 2021-05-03 LAB — CBC
HCT: 34.5 % — ABNORMAL LOW (ref 36.0–46.0)
Hemoglobin: 11.5 g/dL — ABNORMAL LOW (ref 12.0–15.0)
MCH: 30.5 pg (ref 26.0–34.0)
MCHC: 33.3 g/dL (ref 30.0–36.0)
MCV: 91.5 fL (ref 80.0–100.0)
Platelets: 234 10*3/uL (ref 150–400)
RBC: 3.77 MIL/uL — ABNORMAL LOW (ref 3.87–5.11)
RDW: 13.4 % (ref 11.5–15.5)
WBC: 6.9 10*3/uL (ref 4.0–10.5)
nRBC: 0 % (ref 0.0–0.2)

## 2021-05-03 LAB — TROPONIN I (HIGH SENSITIVITY)
Troponin I (High Sensitivity): 111 ng/L (ref <18)
Troponin I (High Sensitivity): 168 ng/L (ref <18)
Troponin I (High Sensitivity): 177 ng/L (ref <18)

## 2021-05-03 LAB — HEPARIN LEVEL (UNFRACTIONATED): Heparin Unfractionated: >1.1 IU/mL — ABNORMAL HIGH (ref 0.30–0.70)

## 2021-05-03 LAB — CUP PACEART REMOTE DEVICE CHECK
Date Time Interrogation Session: 20220910014328
Implantable Pulse Generator Implant Date: 20190516

## 2021-05-03 LAB — BASIC METABOLIC PANEL
Anion gap: 7 (ref 5–15)
BUN: 15 mg/dL (ref 8–23)
CO2: 26 mmol/L (ref 22–32)
Calcium: 8.9 mg/dL (ref 8.9–10.3)
Chloride: 108 mmol/L (ref 98–111)
Creatinine, Ser: 0.85 mg/dL (ref 0.44–1.00)
GFR, Estimated: >60 mL/min (ref >60)
Glucose, Bld: 123 mg/dL — ABNORMAL HIGH (ref 70–99)
Potassium: 4.2 mmol/L (ref 3.5–5.1)
Sodium: 141 mmol/L (ref 135–145)

## 2021-05-03 LAB — RESP PANEL BY RT-PCR (FLU A&B, COVID) ARPGX2
Influenza A by PCR: NEGATIVE
Influenza B by PCR: NEGATIVE
SARS Coronavirus 2 by RT PCR: NEGATIVE

## 2021-05-03 LAB — TSH: TSH: 15.5 u[IU]/mL — ABNORMAL HIGH (ref 0.350–4.500)

## 2021-05-03 LAB — APTT
aPTT: 48 seconds — ABNORMAL HIGH (ref 24–36)
aPTT: >200 seconds (ref 24–36)

## 2021-05-03 MED ORDER — HEPARIN (PORCINE) 25000 UT/250ML-% IV SOLN
12.0000 [IU]/kg/h | INTRAVENOUS | Status: DC
  Filled 2021-05-03: qty 250

## 2021-05-03 MED ORDER — INFLUENZA VAC A&B SA ADJ QUAD 0.5 ML IM PRSY: 0.5000 mL | PREFILLED_SYRINGE | INTRAMUSCULAR | Status: DC

## 2021-05-03 MED ORDER — ASPIRIN EC 325 MG PO TBEC
325.0000 mg | DELAYED_RELEASE_TABLET | Freq: Every day | ORAL | Status: DC
  Administered 2021-05-03: 325 mg via ORAL
  Filled 2021-05-03: qty 1

## 2021-05-03 MED ORDER — ATORVASTATIN CALCIUM 80 MG PO TABS
80.0000 mg | ORAL_TABLET | Freq: Every day | ORAL | Status: DC
  Administered 2021-05-03: 80 mg via ORAL
  Filled 2021-05-03 (×2): qty 1

## 2021-05-03 MED ORDER — NITROGLYCERIN 0.4 MG SL SUBL
0.4000 mg | SUBLINGUAL_TABLET | SUBLINGUAL | Status: DC | PRN
Start: 2021-05-03 — End: 2021-05-03

## 2021-05-03 MED ORDER — ASPIRIN EC 81 MG PO TBEC: 81.0000 mg | DELAYED_RELEASE_TABLET | Freq: Every day | ORAL | Status: DC

## 2021-05-03 MED ORDER — MORPHINE SULFATE (PF) 2 MG/ML IV SOLN
2.0000 mg | INTRAVENOUS | Status: DC | PRN
Start: 2021-05-03 — End: 2021-05-03

## 2021-05-03 MED ORDER — HEPARIN (PORCINE) 25000 UT/250ML-% IV SOLN
950.0000 [IU]/h | INTRAVENOUS | Status: DC
  Administered 2021-05-03: 950 [IU]/h via INTRAVENOUS

## 2021-05-03 NOTE — Progress Notes (Signed)
Discharge instructions explained to pt/ verbalized an understanding/ iv and tele removed/ will transport off unit via wheelchair.  

## 2021-05-03 NOTE — Progress Notes (Signed)
Lab reports critical troponin result 111. Provider B. Randol Kern notified. See new orders. Pt asymptomatic and has no concerns at this time, will continue to monitor    05/03/21 0028  Vitals  Temp 98.7 F (37.1 C)  Temp Source Oral  BP (!) 138/54  MAP (mmHg) 79  BP Location Right Arm  BP Method Automatic  Patient Position (if appropriate) Lying  Pulse Rate 69  Pulse Rate Source Monitor  Resp 20  Level of Consciousness  Level of Consciousness Alert  MEWS COLOR  MEWS Score Color Green  Oxygen Therapy  SpO2 97 %  O2 Device Room Air

## 2021-05-03 NOTE — Progress Notes (Signed)
Lab called to report PTT greater than 200/ pt has already been discharged/ MD made aware

## 2021-05-03 NOTE — Consult Note (Signed)
ANTICOAGULATION CONSULT NOTE - Initial Consult  Pharmacy Consult for heparin infusion Indication: chest pain/ACS -- on apixaban PTA for Afib   Allergies  Allergen Reactions   Heparin Nausea And Vomiting    Patient Measurements: Height: '5\' 4"'$  (162.6 cm) Weight: 100.8 kg (222 lb 4.8 oz) IBW/kg (Calculated) : 54.7 Heparin Dosing Weight: 78.1 kg   Vital Signs: Temp: 97.8 F (36.6 C) (09/13 2152) Temp Source: Oral (09/13 2152) BP: 138/54 (09/14 0028) Pulse Rate: 69 (09/14 0028)  Labs: Recent Labs    05/02/21 2201 05/03/21 0005  HGB 13.4  --   HCT 41.7  --   PLT 256  --   CREATININE 0.94  --   TROPONINIHS 22* 111*    Estimated Creatinine Clearance: 54.2 mL/min (by C-G formula based on SCr of 0.94 mg/dL).   Medical History: Past Medical History:  Diagnosis Date   Anxiety    Arthritis    "shoulders" (08/20/2014)   Asthma    Chronic lower back pain    Complication of anesthesia    "agitated & restless after knee replacement in 2008"   COPD (chronic obstructive pulmonary disease) (HCC)    DDD (degenerative disc disease), lumbar    Depression    Diabetes mellitus without complication (HCC)    Diverticulosis    Family history of adverse reaction to anesthesia    GERD (gastroesophageal reflux disease)    High cholesterol    Hypertension    Hypothyroidism    Memory loss    mild   On home oxygen therapy    "2L at night" (08/20/2014)   PAF (paroxysmal atrial fibrillation) (HCC)    Peripheral vascular disease (HCC)    Pre-diabetes    Shortness of breath dyspnea    with exertion   Stroke (HCC)    mild, no deficits    Medications:  Apixaban 5 mg BID PTA: last dose 9/13 0900   Assessment: 80 y.o. female female with history of atrial fibrillation on apixaban PTA, presented to the ER for evaluation of midsternal chest pressure palpitations ,nausea , dizziness. Troponin 42>111. Pharmacy has been consulted to initiate heparin infusion for ACS.   Baseline HL, aPTT  ordered  Goal of Therapy:  Heparin level 0.3-0.7 units/ml aPTT 66-102 seconds Monitor platelets by anticoagulation protocol: Yes   Plan:  Start heparin infusion at 950 units/hr Check aPTT level in 8 hours and daily while on heparin Transition to HL once aPTT and heparin levels begin to correlate  Continue to monitor H&H and platelets  Dorothe Pea, PharmD, BCPS Clinical Pharmacist   05/03/2021,1:18 AM

## 2021-05-03 NOTE — Plan of Care (Signed)

## 2021-05-03 NOTE — Consult Note (Signed)
Cardiology Consultation:   Patient ID: [name] Douglas MRN: [id number]; DOB: [birth year]  Admit date: 05/02/2021 Date of Consult: 05/03/2021  PCP:  Lavera Guise, MD   [facility] Providers Cardiologist:  None        Patient Profile:   [name] Douglas is a 80 y.o. female with a hx of COPD, DM2, GERD, HTN, dyslipidemia, hypothyroidism, CVA, carotid artery disease, paroxysmal afib and PVD who is being seen 05/03/2021 for the evaluation of chest pain and SVT at the request of Dr. Carilyn Goodpasture.  History of Present Illness:   [name] Douglas has no prior history of MI or cardiac stenting. No smoking history. She lives at an elderly facility by herself. Uses a rollator. H/o of falls, last one in February 2022.   H/o of stroke in early 2018 and was trated with tPA. She fell in April 2018 and had intracranial bleed that did not require neurosurgical intervention. Hospitalized again for parietal and left centrum ovale storke. She was suspected emoblic stroke, Echo showed normal LVSF, G1DD, mild AI, mild pulmonary hypertension. No cardiac source of emboli was identified. Carotid doppler showed mild atherosclerosis bilaterally without obstructive disease. She had a 30 day outpatient monitor the year before with no afib. The patient was referred to EP for a loop recorder, which she underwent 12/2017. Interrogation 3/21 showed afib and she was started on Eliquis. CHADVASC at least 6.   The patient was last seen 09/13/20 and was dong well from a cardiac perspective.   Most recent echo 10/13/20 showed LVSF 60-65%, no WMA.   The patient presented to the ER at [facility] 05/03/21 for dizziness, nausea, palpitations, chest pain. Yesterday she was with her son and his fiance. They were outside sitting. When she stood up she felt sudden onset dizziness, chest tightness, nausea, no vomiting. They went inside and a cool towel was placed on her neck, which seemed to improve symptoms. When she didn't feel better after 20-30 minutes,  EMS was called. They found her  to bein SVT with rates in the 170s which she was given adenosine with successful conversion to SR.   In the ER BP 168/88, HR 98. EKG shows SR with Hr 94bpm, first degree block with no ST/T wave changes. Labs showed BNP 133. HS trop 22>111. Potassium 4.7, Scr 0.94, BUN 14, glucose 129, WBC 8.3, Hgb 13.4.COVID negative. CXR with cardiomegaly and central vascular congestion with no overt edema.she was given lorpessor 12.'5mg'$  and hihg dose aspirin. Started on IV heparin and admitted.   Past Medical History:  Diagnosis Date   Anxiety    Arthritis    "shoulders" (08/20/2014)   Asthma    Chronic lower back pain    Complication of anesthesia    "agitated & restless after knee replacement in 2008"   COPD (chronic obstructive pulmonary disease) (HCC)    DDD (degenerative disc disease), lumbar    Depression    Diabetes mellitus without complication ([place])    Diverticulosis    Family history of adverse reaction to anesthesia    GERD (gastroesophageal reflux disease)    High cholesterol    Hypertension    Hypothyroidism    Memory loss    mild   On home oxygen therapy    "2L at night" (08/20/2014)   PAF (paroxysmal atrial fibrillation) (HCC)    Peripheral vascular disease (HCC)    Pre-diabetes    Shortness of breath dyspnea    with exertion   Stroke (HCC)    mild,  no deficits    Past Surgical History:  Procedure Laterality Date   CARDIAC CATHETERIZATION  ~ 2007   EXCISIONAL HEMORRHOIDECTOMY  1960's   EYE SURGERY Bilateral    Cataract extraction with IOL   KNEE ARTHROSCOPY Left X 2 <2008   KNEE ARTHROSCOPY WITH SUBCHONDROPLASTY Right 04/19/2016   Procedure: KNEE ARTHROSCOPY WITH SUBCHONDROPLASTY, PARTIAL MENISCECTOMY;  Surgeon: Hessie Knows, MD;  Location: [facility];  Service: Orthopedics;  Laterality: Right;   LOOP RECORDER INSERTION N/A 01/02/2018   Procedure: LOOP RECORDER INSERTION;  Surgeon: Deboraha Sprang, MD;  Location: [facility];  Service:  Cardiovascular;  Laterality: N/A;   LUMBAR LAMINECTOMY/DECOMPRESSION MICRODISCECTOMY N/A 02/06/2021   Procedure: Lumbar Three-Four, Lumbar Four-Five Laminectomy and Foraminotomy;  Surgeon: Kristeen Miss, MD;  Location: [facility];  Service: Neurosurgery;  Laterality: N/A;   SHOULDER OPEN ROTATOR CUFF REPAIR Right 2001   TONSILLECTOMY  ~ [place] ARTHROPLASTY Left 2008   TOTAL KNEE ARTHROPLASTY Right 07/03/2016   Procedure: TOTAL KNEE ARTHROPLASTY;  Surgeon: Hessie Knows, MD;  Location: [facility];  Service: Orthopedics;  Laterality: Right;   TUBAL LIGATION  1973   VAGINAL HYSTERECTOMY  1974     Home Medications:  Prior to Admission medications   Medication Sig Start Date End Date Taking? Authorizing Provider  apixaban (ELIQUIS) 5 MG TABS tablet Take 1 tablet (5 mg total) by mouth 2 (two) times daily. 09/13/20  Yes Deboraha Sprang, MD  albuterol (VENTOLIN HFA) 108 (90 Base) MCG/ACT inhaler Inhale 2 puffs into the lungs every 6 (six) hours as needed for wheezing or shortness of breath. 08/05/20   Shelly Coss, MD  amLODipine (NORVASC) 5 MG tablet Take 1 tablet (5 mg total) by mouth daily. 04/10/21   Jonetta Osgood, NP  Aspirin-Acetaminophen-Caffeine (GOODY HEADACHE PO) Take 1 Package by mouth daily as needed (headache).    [provider]  atorvastatin (LIPITOR) 80 MG tablet Take 1 tablet (80 mg total) by mouth daily at 6 PM. 08/22/20   Ronnell Freshwater, NP  cyclobenzaprine (FLEXERIL) 5 MG tablet TAKE 1 TABLET BY MOUTH AT BEDTIME 12/05/20   Lavera Guise, MD  docusate sodium (COLACE) 100 MG capsule Take 100 mg by mouth daily as needed for mild constipation.     [provider]  donepezil (ARICEPT) 5 MG tablet Take 1 tablet (5 mg total) by mouth at bedtime. 04/28/20   Ronnell Freshwater, NP  gabapentin (NEURONTIN) 100 MG capsule Take 200 mg by mouth 3 (three) times daily. 06/24/20   [provider]  levothyroxine (SYNTHROID) 75 MCG tablet Take 1 tablet (75 mcg total) by  mouth daily before breakfast. 12/01/20   Lavera Guise, MD  losartan (COZAAR) 25 MG tablet TAKE 1 TABLET BY MOUTH DAILY 03/01/21   Lavera Guise, MD  methocarbamol (ROBAXIN) 500 MG tablet Take 1 tablet (500 mg total) by mouth every 6 (six) hours as needed for muscle spasms. 02/08/21   Kristeen Miss, MD  methylPREDNISolone (MEDROL DOSEPAK) 4 MG TBPK tablet Follow package insert 01/06/21   Curatolo, Adam, DO  metoprolol succinate (TOPROL-XL) 50 MG 24 hr tablet Take 2 tablets (100 mg total) by mouth daily. 09/28/20   Lavera Guise, MD  oxyCODONE (ROXICODONE) 5 MG immediate release tablet Take 1 tablet (5 mg total) by mouth every 8 (eight) hours as needed for up to 20 doses for breakthrough pain. 01/06/21   Curatolo, Adam, DO  oxyCODONE-acetaminophen (PERCOCET/ROXICET) 5-325 MG tablet Take 1-2 tablets by  mouth every 4 (four) hours as needed for moderate pain or severe pain. 02/08/21   Kristeen Miss, MD  predniSONE (DELTASONE) 5 MG tablet Take by mouth. 04/05/21   [provider]  traMADol (ULTRAM) 50 MG tablet Take 50 mg by mouth every 6 (six) hours as needed for pain. 01/26/21   [provider]  venlafaxine XR (EFFEXOR-XR) 75 MG 24 hr capsule TAKE 2 CAPSULES BY MOUTH DAILY. 09/28/20   Lavera Guise, MD  Vitamin D, Ergocalciferol, (DRISDOL) 1.25 MG (50000 UNIT) CAPS capsule TAKE 1 CAPSULE BY MOUTH EVERY FRIDAY. 01/03/21   Lavera Guise, MD    Inpatient Medications: Scheduled Meds:  amLODipine  5 mg Oral Daily   [START ON 05/04/2021] aspirin EC  81 mg Oral Daily   atorvastatin  80 mg Oral Daily   cyclobenzaprine  5 mg Oral QHS   donepezil  5 mg Oral QHS   gabapentin  200 mg Oral TID   [START ON 05/04/2021] influenza vaccine adjuvanted  0.5 mL Intramuscular Tomorrow-1000   levothyroxine  75 mcg Oral QAC breakfast   losartan  25 mg Oral Daily   metoprolol succinate  100 mg Oral Daily   venlafaxine XR  150 mg Oral Daily   Vitamin D (Ergocalciferol)  50,000 Units Oral Q7 days   Continuous  Infusions:  sodium chloride 100 mL/hr at 05/03/21 0227   heparin 950 Units/hr (05/03/21 0504)   PRN Meds: acetaminophen **OR** acetaminophen, albuterol, docusate sodium, magnesium hydroxide, methocarbamol, morphine injection, nitroGLYCERIN, ondansetron **OR** ondansetron (ZOFRAN) IV, traZODone  Allergies:    Allergies  Allergen Reactions   Heparin Nausea And Vomiting    Social History:   Social History   Socioeconomic History   Marital status: Divorced    Spouse name: Not on file   Number of children: Not on file   Years of education: Not on file   Highest education level: Not on file  Occupational History   Not on file  Tobacco Use   Smoking status: Some Days    Packs/day: 0.25    Years: 53.00    Pack years: 13.25    Types: Cigarettes   Smokeless tobacco: Never   Tobacco comments:    3 cigarettes in week  Vaping Use   Vaping Use: Never used  Substance and Sexual Activity   Alcohol use: No   Drug use: No   Sexual activity: Not Currently  Other Topics Concern   Not on file  Social History Narrative   Not on file   Social Determinants of Health   Financial Resource Strain: Not on file  Food Insecurity: Not on file  Transportation Needs: Not on file  Physical Activity: Not on file  Stress: Not on file  Social Connections: Not on file  Intimate Partner Violence: Not on file    Family History:    Family History  Problem Relation Age of Onset   Diabetes Mother    Hypertension Mother    Lung cancer Mother    Congestive Heart Failure Father    Congestive Heart Failure Sister      ROS:  Please see the history of present illness.   All other ROS reviewed and negative.     Physical Exam/Data:   Vitals:   05/02/21 2302 05/02/21 2330 05/03/21 0028 05/03/21 0749  BP: 115/63 (!) 127/103 (!) 138/54 (!) 160/60  Pulse: 69 70 69 65  Resp:  '16 20 18  '$ Temp:   98.7 F (37.1 C) 98.1 F (36.7  C)  TempSrc:   Oral Oral  SpO2:  96% 97% 96%  Weight:   99 kg    Height:        Intake/Output Summary (Last 24 hours) at 05/03/2021 0818 Last data filed at 05/03/2021 0425 Gross per 24 hour  Intake 0 ml  Output 200 ml  Net -200 ml   Last 3 Weights 05/03/2021 05/02/2021 04/10/2021  Weight (lbs) 218 lb 4.1 oz 222 lb 4.8 oz 221 lb  Weight (kg) 99 kg 100.835 kg 100.245 kg     Body mass index is 37.46 kg/m.  General:  Well nourished, well developed, in no acute distress HEENT: normal Lymph: no adenopathy Neck: no JVD Endocrine:  No thryomegaly Vascular: No carotid bruits; FA pulses 2+ bilaterally without bruits  Cardiac:  normal S1, S2; RRR; no murmur  Lungs:  clear to auscultation bilaterally, no wheezing, rhonchi or rales  Abd: soft, nontender, no hepatomegaly  Ext: no edema Musculoskeletal:  No deformities, BUE and BLE strength normal and equal Skin: warm and dry  Neuro:  CNs 2-12 intact, no focal abnormalities noted Psych:  Normal affect   EKG:  The EKG was personally reviewed and demonstrates:  Nsr, 94bpm, first degree AV block, no ST/T wave changes Telemetry:  Telemetry was personally reviewed and demonstrates:  NSR, HR 60-70s, no SVT noted  Relevant CV Studies:   Echo 09/2020 . Left ventricular ejection fraction, by estimation, is 60 to 65%. The  left ventricle has normal function. The left ventricle has no regional  wall motion abnormalities. Left ventricular diastolic parameters are  indeterminate.   2. Right ventricular systolic function is normal. The right ventricular  size is normal. There is mildly elevated pulmonary artery systolic  pressure. The estimated right ventricular systolic pressure is 0000000 mmHg.   [place] b/l carotids 06/2020 IMPRESSION: 1. Right carotid artery system: Less than 50% stenosis secondary to moderate multifocal atherosclerotic plaque formation.   2. Left carotid artery system: Less than 50% stenosis secondary to moderate multifocal atherosclerotic plaque formation.   3. Pulsus bisferiens waveform  visualized in the bilateral carotid arteries as could be seen with aortic valvulopathy.   4.  Vertebral artery system: Patent with antegrade flow bilaterally.   Ruthann Cancer, MD   [facility]   [facility]  Laboratory Data:  High Sensitivity Troponin:   Recent Labs  Lab 05/02/21 2201 05/03/21 0005  TROPONINIHS 22* 111*     Chemistry Recent Labs  Lab 05/02/21 2201 05/03/21 0143  NA 144 141  K 4.7 4.2  CL 108 108  CO2 23 26  GLUCOSE 129* 123*  BUN 14 15  CREATININE 0.94 0.85  CALCIUM 9.2 8.9  GFRNONAA >60 >60  ANIONGAP 13 7    No results for input(s): PROT, ALBUMIN, AST, ALT, ALKPHOS, BILITOT in the last 168 hours. Hematology Recent Labs  Lab 05/02/21 2201 05/03/21 0143  WBC 8.3 6.9  RBC 4.47 3.77*  HGB 13.4 11.5*  HCT 41.7 34.5*  MCV 93.3 91.5  MCH 30.0 30.5  MCHC 32.1 33.3  RDW 13.3 13.4  PLT 256 234   BNP Recent Labs  Lab 05/02/21 2201  BNP 133.4*    DDimer No results for input(s): DDIMER in the last 168 hours.   Radiology/Studies:  DG Chest 2 View  Result Date: 05/02/2021 CLINICAL DATA:  Dizziness, near syncope, SVT EXAM: CHEST - 2 VIEW COMPARISON:  10/12/2020 FINDINGS: Frontal and lateral views of the chest demonstrate mild enlargement the cardiac silhouette.  There is central vascular congestion without airspace disease, effusion, or pneumothorax. No acute bony abnormalities. IMPRESSION: 1. Enlarged cardiac silhouette with central vascular congestion. No overt edema. Electronically Signed   By: Randa Ngo M.D.   On: 05/02/2021 22:24     Assessment and Plan:   Chest pain/possible SVT ?NSTEMI - presented with sudden onset dizziness, chest tightness, nausea. EMS was called who found her in SVT s/p adenosine '6mg'$  x 1with conversion to SR.  - In the ER EKG show SR, 1st degree AV block with no St/ T wave changes. BNP mildly elevated 133. HS troponin 22>111 started on IV heparin.  - Tele shows no  further tachy-arrhythmia episodes - she has no h/o MI or prior stenting.  - Patient feels asymptomatic with no further chest tightness.  - Echo in 09/2020 showed normal LVSF and no WMA - can check ILR - continue statin and BB. No ASA with Eliquis - continue to trend troponin. If flat suspect demand ischemia and can likely stop heparin.   PAF - h/o ICH - on Eliquis or CHADSVASC at least 6>>held for IV heparin - she denies missing doses - no afib noted on telemetry>> check ILR as above - continue rate control with Toprol-XL '100mg'$  daily  HTN - continue PTA amlodipine, losartan 25 mg daily, Toprol '50mg'$  Daily - Bps mildly elevated  HLD - continue PTA atorvastatin '80mg'$  daily - LDL 77 09/2020  Carotid artery disease - [place] 06/2020 showed b/l stenosis less than 50%  For questions or updates, please contact [facility] Please consult [email or web address] for contact info under    Signed, Mckenzey Parcell Ninfa Meeker, PA-C  05/03/2021 8:18 AM

## 2021-05-03 NOTE — Discharge Summary (Signed)
[facility]  Physician Discharge Summary   Patient ID: [name] Douglas MRN: [id number] DOB/AGE: [birth year] 80 y.o.  Admit date: 05/02/2021 Discharge date: 05/03/2021    PCP: Lavera Guise, MD  DISCHARGE DIAGNOSES:  Paroxysmal supraventricular tachycardia Chest pain likely as a result of SVT Essential hypertension Dyslipidemia Hypothyroidism History of dementia and depression  RECOMMENDATIONS FOR OUTPATIENT FOLLOW UP: Outpatient follow-up with her electrophysiologist, Dr. Caryl Comes   Home Health: None Equipment/Devices: None  CODE STATUS: Full code  DISCHARGE CONDITION: fair  Diet recommendation: As before  INITIAL HISTORY: 80 y.o. female with medical history significant for COPD, type 2 diabetes mellitus, GERD, hypertension, dyslipidemia and hypothyroidism, as well as paroxysmal atrial fibrillation and PVD, who presented to the ER with acute onset of dizziness with associated nausea without vomiting and chest tightness with associated palpitations.  She graded her chest tightness is 4-5/10 in severity without radiation but admitted to associated dyspnea with no diaphoresis.  She denies any leg pain or edema or recent travels or surgeries.  No cough or wheezing or hemoptysis.  No bleeding diathesis.  No dysuria, oliguria or hematuria or flank pain.  She was noted to have SVT by EMS with a rate in the 170s for which she was given adenosine with successful conversion to normal sinus rhythm. ED Course: When she came to ER blood pressure was 168/88 and heart rate was 98 with otherwise normal vital signs.  Labs revealed BNP of 133.4 and high-sensitivity troponin I of 22 and later 111 with normal BMP and normal CBC.  Influenza antigens and COVID-19 PCR came back negative.   EKG as reviewed by me : Sinus rhythm with a rate of 94 with prolonged PR interval and low voltage. Imaging: Two-view chest x-ray showed cardiomegaly and central vascular congestion with no overt edema.  The  patient was given 12.5 mg p.o. Lopressor.  With her second troponin I started on IV heparin drip.  She was also given aspirin as well as high-dose statin.  She was pain-free in the ER.  She will be admitted to a progressive unit bed for further evaluation and management.  Consultations: Cardiology  Procedures: None  HOSPITAL COURSE:   Paroxysmal supraventricular tachycardia/chest pain/elevated troponin  Patient was given adenosine in the field with a conversion to sinus rhythm.  Chest discomfort also resolved subsequently.  She was found to have elevated troponin levels.  She was seen by cardiology.  No further work-up was recommended.  Her symptoms were thought to be secondary to SVT.  She does have a implanted loop recorder.  She is followed by Dr. Caryl Comes with cardiology.  A message was sent to the office to arrange outpatient follow-up.  Essential hypertension/Dyslipidemia. Continue home medications  Hypothyroidism. TSH noted to be 15.5.  Discussed with patient's daughter who mentioned that patient used to be on 100 mcg of levothyroxine but it was changed to 75 mcg a few weeks ago by PCP.  Quite likely she may need a slightly higher dose perhaps 88 mcg.  However I have asked the patient and daughter to discuss this with PCP.  They have an appointment in the next few days.  Dementia. Continue Aricept.  Depression. Continue Effexor XR.   Obesity Estimated body mass index is 37.46 kg/m as calculated from the following:   Height as of this encounter: '5\' 4"'$  (1.626 m).   Weight as of this encounter: 99 kg.  Overall patient is stable.  Cleared by cardiology for discharge.  Discussed with daughter in  detail.  Okay for discharge home today.   PERTINENT LABS:  The results of significant diagnostics from this hospitalization (including imaging, microbiology, ancillary and laboratory) are listed below for reference.    Microbiology: Recent Results (from the past 240 hour(s))  Resp Panel  by RT-PCR (Flu A&B, Covid) Nasopharyngeal Swab     Status: None   Collection Time: 05/02/21 11:20 PM   Specimen: Nasopharyngeal Swab; Nasopharyngeal(NP) swabs in vial transport medium  Result Value Ref Range Status   SARS Coronavirus 2 by RT PCR NEGATIVE NEGATIVE Final    Comment: (NOTE) SARS-CoV-2 target nucleic acids are NOT DETECTED.  The SARS-CoV-2 RNA is generally detectable in upper respiratory specimens during the acute phase of infection. The lowest concentration of SARS-CoV-2 viral copies this assay can detect is 138 copies/mL. A negative result does not preclude SARS-Cov-2 infection and should not be used as the sole basis for treatment or other patient management decisions. A negative result may occur with  improper specimen collection/handling, submission of specimen other than nasopharyngeal swab, presence of viral mutation(s) within the areas targeted by this assay, and inadequate number of viral copies(<138 copies/mL). A negative result must be combined with clinical observations, patient history, and epidemiological information. The expected result is Negative.  Fact Sheet for Patients:  EntrepreneurPulse.com.au  Fact Sheet for Healthcare Providers:  IncredibleEmployment.be  This test is no t yet approved or cleared by the [place] FDA and  has been authorized for detection and/or diagnosis of SARS-CoV-2 by FDA under an Emergency Use Authorization (EUA). This EUA will remain  in effect (meaning this test can be used) for the duration of the COVID-19 declaration under Section 564(b)(1) of the Act, 21 U.S.C.section 360bbb-3(b)(1), unless the authorization is terminated  or revoked sooner.       Influenza A by PCR NEGATIVE NEGATIVE Final   Influenza B by PCR NEGATIVE NEGATIVE Final    Comment: (NOTE) The Xpert Xpress SARS-CoV-2/FLU/RSV plus assay is intended as an aid in the diagnosis of influenza from Nasopharyngeal swab  specimens and should not be used as a sole basis for treatment. Nasal washings and aspirates are unacceptable for Xpert Xpress SARS-CoV-2/FLU/RSV testing.  Fact Sheet for Patients: EntrepreneurPulse.com.au  Fact Sheet for Healthcare Providers: IncredibleEmployment.be  This test is not yet approved or cleared by the [place] FDA and has been authorized for detection and/or diagnosis of SARS-CoV-2 by FDA under an Emergency Use Authorization (EUA). This EUA will remain in effect (meaning this test can be used) for the duration of the COVID-19 declaration under Section 564(b)(1) of the Act, 21 U.S.C. section 360bbb-3(b)(1), unless the authorization is terminated or revoked.  Performed at [facility], [address] [place], [place] [address]      Labs:  COVID-19 Labs   Lab Results  Component Value Date   [place] 05/02/2021   SARSCOV2NAA NEGATIVE 02/07/2021   SARSCOV2NAA NEGATIVE 02/03/2021   SARSCOV2NAA POSITIVE (A) 10/12/2020      Basic Metabolic Panel: Recent Labs  Lab 05/02/21 2201 05/03/21 0143  NA 144 141  K 4.7 4.2  CL 108 108  CO2 23 26  GLUCOSE 129* 123*  BUN 14 15  CREATININE 0.94 0.85  CALCIUM 9.2 8.9    CBC: Recent Labs  Lab 05/02/21 2201 05/03/21 0143  WBC 8.3 6.9  HGB 13.4 11.5*  HCT 41.7 34.5*  MCV 93.3 91.5  PLT 256 234    BNP: BNP (last 3 results) Recent Labs    05/02/21 2201  BNP 133.4*     IMAGING STUDIES DG Chest 2 View  Result Date: 05/02/2021 CLINICAL DATA:  Dizziness, near syncope, SVT EXAM: CHEST - 2 VIEW COMPARISON:  10/12/2020 FINDINGS: Frontal and lateral views of the chest demonstrate mild enlargement the cardiac silhouette. There is central vascular congestion without airspace disease, effusion, or pneumothorax. No acute bony abnormalities. IMPRESSION: 1. Enlarged cardiac silhouette with central vascular congestion. No overt edema. Electronically  Signed   By: Randa Ngo M.D.   On: 05/02/2021 22:24   CUP PACEART REMOTE DEVICE CHECK  Result Date: 05/03/2021 ILR summary report received. Battery status OK. Normal device function. No new symptom, tachy, brady, or pause episodes. No new AF episodes. Monthly summary reports and ROV/PRN Kathy Breach, RN, CCDS, [employer]   DISCHARGE EXAMINATION: Vitals:   05/02/21 2330 05/03/21 0028 05/03/21 0749 05/03/21 1127  BP: (!) 127/103 (!) 138/54 (!) 160/60 (!) 129/41  Pulse: 70 69 65 66  Resp: '16 20 18 18  '$ Temp:  98.7 F (37.1 C) 98.1 F (36.7 C) 98.3 F (36.8 C)  TempSrc:  Oral Oral Oral  SpO2: 96% 97% 96% 96%  Weight:  99 kg    Height:       General appearance: Awake alert.  In no distress Resp: Clear to auscultation bilaterally.  Normal effort Cardio: S1-S2 is normal regular.  No S3-S4.  No rubs murmurs or bruit GI: Abdomen is soft.  Nontender nondistended.  Bowel sounds are present normal.  No masses organomegaly    DISPOSITION: Home  Discharge Instructions     Call MD for:  difficulty breathing, headache or visual disturbances   Complete by: As directed    Call MD for:  extreme fatigue   Complete by: As directed    Call MD for:  persistant dizziness or light-headedness   Complete by: As directed    Call MD for:  persistant nausea and vomiting   Complete by: As directed    Call MD for:  severe uncontrolled pain   Complete by: As directed    Call MD for:  temperature >100.4   Complete by: As directed    Diet - low sodium heart healthy   Complete by: As directed    Discharge instructions   Complete by: As directed    Please be sure to follow-up with Dr. Caryl Comes in the next 1 to 2 weeks.  If you do not hear from their office please call them next week for an appointment.  You were cared for by a hospitalist during your hospital stay. If you have any questions about your discharge medications or the care you received while you were in the hospital after you are  discharged, you can call the unit and asked to speak with the hospitalist on call if the hospitalist that took care of you is not available. Once you are discharged, your primary care physician will handle any further medical issues. Please note that NO REFILLS for any discharge medications will be authorized once you are discharged, as it is imperative that you return to your primary care physician (or establish a relationship with a primary care physician if you do not have one) for your aftercare needs so that they can reassess your need for medications and monitor your lab values. If you do not have a primary care physician, you can call [phone] for a physician referral.   Increase activity slowly   Complete by: As directed  Allergies as of 05/03/2021       Reactions   Heparin Nausea And Vomiting        Medication List     STOP taking these medications    albuterol 108 (90 Base) MCG/ACT inhaler Commonly known as: VENTOLIN HFA   docusate sodium 100 MG capsule Commonly known as: COLACE   GOODY HEADACHE PO   methocarbamol 500 MG tablet Commonly known as: ROBAXIN   methylPREDNISolone 4 MG Tbpk tablet Commonly known as: MEDROL DOSEPAK   oxyCODONE 5 MG immediate release tablet Commonly known as: Roxicodone   oxyCODONE-acetaminophen 5-325 MG tablet Commonly known as: PERCOCET/ROXICET   predniSONE 5 MG tablet Commonly known as: DELTASONE   traMADol 50 MG tablet Commonly known as: ULTRAM       TAKE these medications    amLODipine 5 MG tablet Commonly known as: NORVASC Take 1 tablet (5 mg total) by mouth daily.   apixaban 5 MG Tabs tablet Commonly known as: Eliquis Take 1 tablet (5 mg total) by mouth 2 (two) times daily.   atorvastatin 80 MG tablet Commonly known as: LIPITOR Take 1 tablet (80 mg total) by mouth daily at 6 PM.   cyclobenzaprine 5 MG tablet Commonly known as: FLEXERIL TAKE 1 TABLET BY MOUTH AT BEDTIME   donepezil 5 MG  tablet Commonly known as: ARICEPT Take 1 tablet (5 mg total) by mouth at bedtime.   gabapentin 100 MG capsule Commonly known as: NEURONTIN Take 100 mg by mouth 3 (three) times daily.   levothyroxine 75 MCG tablet Commonly known as: SYNTHROID Take 1 tablet (75 mcg total) by mouth daily before breakfast.   losartan 25 MG tablet Commonly known as: COZAAR TAKE 1 TABLET BY MOUTH DAILY   metoprolol succinate 50 MG 24 hr tablet Commonly known as: TOPROL-XL Take 2 tablets (100 mg total) by mouth daily.   venlafaxine XR 75 MG 24 hr capsule Commonly known as: EFFEXOR-XR TAKE 2 CAPSULES BY MOUTH DAILY.   Vitamin D (Ergocalciferol) 1.25 MG (50000 UNIT) Caps capsule Commonly known as: DRISDOL TAKE 1 CAPSULE BY MOUTH EVERY FRIDAY.          Follow-up Information     Lavera Guise, MD. Schedule an appointment as soon as possible for a visit in 1 week(s).   Specialties: Internal Medicine, Cardiology Why: @ Contact information: [place] [address] [phone]         Deboraha Sprang, MD. Schedule an appointment as soon as possible for a visit on 05/12/2021.   Specialty: Cardiology Why: @ 8:30am Patient will see the PA. Contact information: [place] [id number] [phone]                 TOTAL DISCHARGE TIME: 35 minutes  Sukaina Toothaker [place]  [facility] Pager on [email or web address]  05/04/2021, 1:13 PM

## 2021-05-03 NOTE — Telephone Encounter (Signed)
-----   Message from Kyle, PA-C sent at 05/03/2021  9:56 AM EDT ----- Regarding: hospital follow-up Patient needs hospital follow-up with Dr. Caryl Comes in 2-3 weeks. Please call and schedule later this afternoon maybe. Unsure if she will be discharged today, but likely.

## 2021-05-04 NOTE — Telephone Encounter (Signed)
Reviewed the patient's chart. Per Dr. Thereasa Solo consult note from 05/03/21, she needs to be seen by Dr. Caryl Comes post hospital  1.  SVT -S/p adenosine 6 mg IV in the field per EMS with resolution -Maintaining sinus rhythm -Continue Toprol-XL 100 mg daily. -No further episodes of arrhythmia so far -Plan ILR interrogation by primary cardiologist/Dr. Caryl Comes at follow-up upon discharge   She currently has an 05/25/21 appointment scheduled with Marrianne Mood, PA with appointment notes stating she prefers afternoon.  She should be rescheduled to Dr. Caryl Comes Can offer: Tuesday 05/16/21 at 11:40 am Tuesday 05/23/21  at 11:40 am Both of these slots will be a double book, but options are limited otherwise within the requested time frame.  There are currently no openings on the [facility] schedule either.

## 2021-05-08 ENCOUNTER — Ambulatory Visit (INDEPENDENT_AMBULATORY_CARE_PROVIDER_SITE_OTHER): Payer: Medicare Other

## 2021-05-08 ENCOUNTER — Inpatient Hospital Stay: Payer: Medicare Other | Admitting: Physician Assistant

## 2021-05-08 ENCOUNTER — Telehealth: Payer: Self-pay

## 2021-05-08 DIAGNOSIS — I639 Cerebral infarction, unspecified: Secondary | ICD-10-CM | POA: Diagnosis not present

## 2021-05-08 NOTE — Telephone Encounter (Signed)
Pt daughter called that they tsh in hospital told her that she need medication change as per lauren advised daughter that go head do labs and change  her hospital follow earlier

## 2021-05-09 ENCOUNTER — Other Ambulatory Visit: Payer: Self-pay | Admitting: Internal Medicine

## 2021-05-09 DIAGNOSIS — I10 Essential (primary) hypertension: Secondary | ICD-10-CM

## 2021-05-09 NOTE — Telephone Encounter (Signed)
Was anyone able to try to reach out to the patient to reschedule her follow up?   Just checking! Thanks!

## 2021-05-11 ENCOUNTER — Ambulatory Visit (INDEPENDENT_AMBULATORY_CARE_PROVIDER_SITE_OTHER): Payer: Medicare Other | Admitting: Physician Assistant

## 2021-05-11 ENCOUNTER — Encounter: Payer: Self-pay | Admitting: Physician Assistant

## 2021-05-11 ENCOUNTER — Other Ambulatory Visit: Payer: Self-pay

## 2021-05-11 DIAGNOSIS — E559 Vitamin D deficiency, unspecified: Secondary | ICD-10-CM | POA: Diagnosis not present

## 2021-05-11 DIAGNOSIS — E538 Deficiency of other specified B group vitamins: Secondary | ICD-10-CM | POA: Diagnosis not present

## 2021-05-11 DIAGNOSIS — E782 Mixed hyperlipidemia: Secondary | ICD-10-CM | POA: Diagnosis not present

## 2021-05-11 DIAGNOSIS — Z09 Encounter for follow-up examination after completed treatment for conditions other than malignant neoplasm: Secondary | ICD-10-CM

## 2021-05-11 DIAGNOSIS — R5383 Other fatigue: Secondary | ICD-10-CM

## 2021-05-11 DIAGNOSIS — D509 Iron deficiency anemia, unspecified: Secondary | ICD-10-CM | POA: Diagnosis not present

## 2021-05-11 DIAGNOSIS — D508 Other iron deficiency anemias: Secondary | ICD-10-CM | POA: Diagnosis not present

## 2021-05-11 DIAGNOSIS — I480 Paroxysmal atrial fibrillation: Secondary | ICD-10-CM

## 2021-05-11 DIAGNOSIS — E039 Hypothyroidism, unspecified: Secondary | ICD-10-CM

## 2021-05-11 DIAGNOSIS — I10 Essential (primary) hypertension: Secondary | ICD-10-CM

## 2021-05-11 NOTE — Progress Notes (Signed)
[facility] [place], [place] [address]  Internal MEDICINE  Office Visit Note  Patient Name: [name] Douglas  [id number]  [id number]  Date of Service: 05/17/2021     Chief Complaint  Patient presents with   Hospitalization Follow-up    Discuss meds     HPI Pt is here for recent hospital follow up with her son and has no complaints today. -Patient presented to the emergency room on 9/13 after experiencing acute dizziness and nausea as well as chest tightness and palpitations.  In the ED she ranks her chest tightness as a 4-5 out of 10 without radiation but did have some shortness of breath.  She was brought in by EMS who found that she was in SVT with a rate in the 170s and was given adenosine with successful conversion to normal sinus rhythm.  In the emergency room her blood pressure was 168/88 with a heart rate of 98.  She was found to have an elevated troponin of 22 and later 111, as well as a BNP of 133.  EKG showed sinus rhythm with a rate of 94 with prolonged PR and low voltage per ED notes.  A chest x-ray was also ordered and showed cardiomegaly and central vascular congestion without any overt edema.  Patient was also given 12.5 mg p.o. Lopressor and started on IV heparin drip after her second troponin, and was given aspirin and high-dose statin.  She was admitted for further evaluation and management.  Patient does have an implanted loop recorder and is followed by Dr. Caryl Comes with cardiology with whom she has an upcoming outpatient follow-up scheduled.  She was cleared by cardiology for discharge and left the hospital on 9/14. -No medications changes on discharge.  Does state that she was found to have an abnormal thyroid labs drawn and upon chart investigation her TSH is noted to have been 15.5 however no free T4 was drawn.  We will have patient go for updated labs prior to adjusting medication but will likely need a slight increase in her Synthroid  dose. -Sleep is ok, but always tired. No snoring.  -Denies any recurrence of palpitations, dizziness, CP, or SOB  Current Medication: Outpatient Encounter Medications as of 05/11/2021  Medication Sig Note   apixaban (ELIQUIS) 5 MG TABS tablet Take 1 tablet (5 mg total) by mouth 2 (two) times daily.    atorvastatin (LIPITOR) 80 MG tablet Take 1 tablet (80 mg total) by mouth daily at 6 PM. 05/03/2021: LF 9.12.22   cyclobenzaprine (FLEXERIL) 5 MG tablet TAKE 1 TABLET BY MOUTH AT BEDTIME 05/03/2021: LF 9.12.22   donepezil (ARICEPT) 5 MG tablet Take 1 tablet (5 mg total) by mouth at bedtime. 05/03/2021: LF 7.13.22 for 90 day supply    gabapentin (NEURONTIN) 100 MG capsule Take 100 mg by mouth 3 (three) times daily. 05/03/2021: LF 7.13.22   losartan (COZAAR) 25 MG tablet TAKE 1 TABLET BY MOUTH DAILY 05/03/2021: LF 9.12.22   metoprolol succinate (TOPROL-XL) 50 MG 24 hr tablet TAKE 2 TABLETS BY MOUTH DAILY    Vitamin D, Ergocalciferol, (DRISDOL) 1.25 MG (50000 UNIT) CAPS capsule TAKE 1 CAPSULE BY MOUTH EVERY FRIDAY. 05/03/2021: LF 9.13.22   [DISCONTINUED] amLODipine (NORVASC) 5 MG tablet Take 1 tablet (5 mg total) by mouth daily. 05/03/2021: Last Fill 8.22.22   [DISCONTINUED] levothyroxine (SYNTHROID) 75 MCG tablet Take 1 tablet (75 mcg total) by mouth daily before breakfast. 05/03/2021: LF 7.13.22   [DISCONTINUED] venlafaxine XR (EFFEXOR-XR) 75 MG 24  hr capsule TAKE 2 CAPSULES BY MOUTH DAILY. 05/03/2021: LF 8.23.22   No facility-administered encounter medications on file as of 05/11/2021.    Surgical History: Past Surgical History:  Procedure Laterality Date   CARDIAC CATHETERIZATION  ~ 2007   EXCISIONAL HEMORRHOIDECTOMY  1960's   EYE SURGERY Bilateral    Cataract extraction with IOL   KNEE ARTHROSCOPY Left X 2 <2008   KNEE ARTHROSCOPY WITH SUBCHONDROPLASTY Right 04/19/2016   Procedure: KNEE ARTHROSCOPY WITH SUBCHONDROPLASTY, PARTIAL MENISCECTOMY;  Surgeon: Hessie Knows, MD;  Location: [facility];   Service: Orthopedics;  Laterality: Right;   LOOP RECORDER INSERTION N/A 01/02/2018   Procedure: LOOP RECORDER INSERTION;  Surgeon: Deboraha Sprang, MD;  Location: [facility];  Service: Cardiovascular;  Laterality: N/A;   LUMBAR LAMINECTOMY/DECOMPRESSION MICRODISCECTOMY N/A 02/06/2021   Procedure: Lumbar Three-Four, Lumbar Four-Five Laminectomy and Foraminotomy;  Surgeon: Kristeen Miss, MD;  Location: [facility];  Service: Neurosurgery;  Laterality: N/A;   SHOULDER OPEN ROTATOR CUFF REPAIR Right 2001   TONSILLECTOMY  ~ [place] ARTHROPLASTY Left 2008   TOTAL KNEE ARTHROPLASTY Right 07/03/2016   Procedure: TOTAL KNEE ARTHROPLASTY;  Surgeon: Hessie Knows, MD;  Location: [facility];  Service: Orthopedics;  Laterality: Right;   TUBAL LIGATION  1973   VAGINAL HYSTERECTOMY  1974    Medical History: Past Medical History:  Diagnosis Date   Anxiety    Arthritis    "shoulders" (08/20/2014)   Asthma    Chronic lower back pain    Complication of anesthesia    "agitated & restless after knee replacement in 2008"   COPD (chronic obstructive pulmonary disease) (HCC)    DDD (degenerative disc disease), lumbar    Depression    Diabetes mellitus without complication (HCC)    Diverticulosis    Family history of adverse reaction to anesthesia    GERD (gastroesophageal reflux disease)    High cholesterol    Hypertension    Hypothyroidism    Memory loss    mild   On home oxygen therapy    "2L at night" (08/20/2014)   PAF (paroxysmal atrial fibrillation) (HCC)    Peripheral vascular disease (HCC)    Pre-diabetes    Shortness of breath dyspnea    with exertion   Stroke (HCC)    mild, no deficits    Family History: Family History  Problem Relation Age of Onset   Diabetes Mother    Hypertension Mother    Lung cancer Mother    Congestive Heart Failure Father    Congestive Heart Failure Sister     Social History   Socioeconomic History   Marital status: Divorced    Spouse name:  Not on file   Number of children: Not on file   Years of education: Not on file   Highest education level: Not on file  Occupational History   Not on file  Tobacco Use   Smoking status: Every Day    Packs/day: 0.25    Years: 53.00    Pack years: 13.25    Types: Cigarettes   Smokeless tobacco: Never   Tobacco comments:    3 cigarettes in week  Vaping Use   Vaping Use: Never used  Substance and Sexual Activity   Alcohol use: No   Drug use: No   Sexual activity: Not Currently  Other Topics Concern   Not on file  Social History Narrative   Not on file   Social Determinants of Health   Financial  Resource Strain: Not on file  Food Insecurity: Not on file  Transportation Needs: Not on file  Physical Activity: Not on file  Stress: Not on file  Social Connections: Not on file  Intimate Partner Violence: Not on file      Review of Systems  Constitutional:  Positive for fatigue. Negative for chills and unexpected weight change.  HENT:  Negative for congestion, postnasal drip, rhinorrhea, sneezing and sore throat.   Eyes:  Negative for redness.  Respiratory:  Negative for cough, chest tightness and shortness of breath.   Cardiovascular:  Negative for chest pain and palpitations.  Gastrointestinal:  Negative for abdominal pain, constipation, diarrhea, nausea and vomiting.  Genitourinary:  Negative for dysuria and frequency.  Musculoskeletal:  Positive for arthralgias. Negative for back pain, joint swelling and neck pain.  Skin:  Negative for rash.  Neurological: Negative.  Negative for tremors and numbness.  Hematological:  Negative for adenopathy. Does not bruise/bleed easily.  Psychiatric/Behavioral:  Negative for behavioral problems (Depression), sleep disturbance and suicidal ideas. The patient is not nervous/anxious.    Vital Signs: BP 138/60   Pulse 75   Temp 98.5 F (36.9 C)   Resp 16   Ht 5\' 4"  (1.626 m)   Wt 217 lb 12.8 oz (98.8 kg)   SpO2 97%   BMI 37.39  kg/m    Physical Exam Vitals and nursing note reviewed.  Constitutional:      General: She is not in acute distress.    Appearance: She is well-developed. She is obese. She is not diaphoretic.  HENT:     Head: Normocephalic and atraumatic.     Mouth/Throat:     Pharynx: No oropharyngeal exudate.  Eyes:     Pupils: Pupils are equal, round, and reactive to light.  Neck:     Thyroid: No thyromegaly.     Vascular: No JVD.     Trachea: No tracheal deviation.  Cardiovascular:     Rate and Rhythm: Normal rate and regular rhythm.     Heart sounds: Normal heart sounds. No murmur heard.   No friction rub. No gallop.  Pulmonary:     Effort: Pulmonary effort is normal. No respiratory distress.     Breath sounds: No wheezing or rales.  Chest:     Chest wall: No tenderness.  Abdominal:     General: Bowel sounds are normal.     Palpations: Abdomen is soft.  Musculoskeletal:        General: Normal range of motion.     Cervical back: Normal range of motion and neck supple.  Lymphadenopathy:     Cervical: No cervical adenopathy.  Skin:    General: Skin is warm and dry.  Neurological:     Mental Status: She is alert and oriented to person, place, and time.     Cranial Nerves: No cranial nerve deficit.  Psychiatric:        Behavior: Behavior normal.        Thought Content: Thought content normal.        Judgment: Judgment normal.      Assessment/Plan: 1. Encounter for examination following treatment at [facility] course reviewed, no medication changes were made and patient has upcoming cardiology follow-up  2. Essential hypertension Stable, continue current medications  3. Acquired hypothyroidism Noted to have elevated TSH during hospital stay, will update labs to include a free T4 and consider adjusting dose of Synthroid based on results, especially given fatigue - TSH + free T4  4. PAF (paroxysmal atrial fibrillation) ([place]) Followed by cardiology, on Eliquis  5.  Vitamin D deficiency - VITAMIN D 25 Hydroxy (Vit-D Deficiency, Fractures)  6. B12 deficiency - B12 and Folate Panel  7. Mixed hyperlipidemia Continue Lipitor and will update labs - Lipid Panel With LDL/HDL Ratio  8. Other fatigue - CBC w/Diff/Platelet - Comprehensive metabolic panel - Fe+TIBC+Fer   General Counseling: athziry millican understanding of the findings of todays visit and agrees with plan of treatment. I have discussed any further diagnostic evaluation that may be needed or ordered today. We also reviewed her medications today. she has been encouraged to call the office with any questions or concerns that should arise related to todays visit.    Counseling:    Orders Placed This Encounter  Procedures   CBC w/Diff/Platelet   Comprehensive metabolic panel   VITAMIN D 25 Hydroxy (Vit-D Deficiency, Fractures)   B12 and Folate Panel   TSH + free T4   Lipid Panel With LDL/HDL Ratio   Fe+TIBC+Fer    This patient was seen by Drema Dallas, PA-C in collaboration with Dr. Clayborn Bigness as a part of collaborative care agreement.   I have reviewed all medical records from hospital follow up including radiology reports and consults from other physicians. Appropriate follow up diagnostics will be scheduled as needed. Patient/ Family understands the plan of treatment. Time spent 35 minutes.   Dr Lavera Guise, MD Internal Medicine

## 2021-05-12 ENCOUNTER — Ambulatory Visit: Payer: Medicare Other | Admitting: Physician Assistant

## 2021-05-12 LAB — TSH+FREE T4
Free T4: 0.92 ng/dL (ref 0.82–1.77)
TSH: 15.2 u[IU]/mL — ABNORMAL HIGH (ref 0.450–4.500)

## 2021-05-12 LAB — CBC WITH DIFFERENTIAL/PLATELET
Basophils Absolute: 0.1 10*3/uL (ref 0.0–0.2)
Basos: 1 %
EOS (ABSOLUTE): 0.3 10*3/uL (ref 0.0–0.4)
Eos: 4 %
Hematocrit: 33.3 % — ABNORMAL LOW (ref 34.0–46.6)
Hemoglobin: 11.3 g/dL (ref 11.1–15.9)
Immature Grans (Abs): 0 10*3/uL (ref 0.0–0.1)
Immature Granulocytes: 0 %
Lymphocytes Absolute: 1.6 10*3/uL (ref 0.7–3.1)
Lymphs: 22 %
MCH: 29.3 pg (ref 26.6–33.0)
MCHC: 33.9 g/dL (ref 31.5–35.7)
MCV: 86 fL (ref 79–97)
Monocytes Absolute: 0.6 10*3/uL (ref 0.1–0.9)
Monocytes: 8 %
Neutrophils Absolute: 4.9 10*3/uL (ref 1.4–7.0)
Neutrophils: 65 %
Platelets: 267 10*3/uL (ref 150–450)
RBC: 3.86 x10E6/uL (ref 3.77–5.28)
RDW: 13.1 % (ref 11.7–15.4)
WBC: 7.5 10*3/uL (ref 3.4–10.8)

## 2021-05-12 LAB — B12 AND FOLATE PANEL
Folate: 11.2 ng/mL (ref >3.0)
Vitamin B-12: 497 pg/mL (ref 232–1245)

## 2021-05-12 LAB — COMPREHENSIVE METABOLIC PANEL
ALT: 13 IU/L (ref 0–32)
AST: 23 IU/L (ref 0–40)
Albumin/Globulin Ratio: 2.1 (ref 1.2–2.2)
Albumin: 4.2 g/dL (ref 3.6–4.6)
Alkaline Phosphatase: 98 IU/L (ref 44–121)
BUN/Creatinine Ratio: 18 (ref 12–28)
BUN: 14 mg/dL (ref 8–27)
Bilirubin Total: 0.3 mg/dL (ref 0.0–1.2)
CO2: 20 mmol/L (ref 20–29)
Calcium: 9.6 mg/dL (ref 8.7–10.3)
Chloride: 101 mmol/L (ref 96–106)
Creatinine, Ser: 0.77 mg/dL (ref 0.57–1.00)
Globulin, Total: 2 g/dL (ref 1.5–4.5)
Glucose: 92 mg/dL (ref 65–99)
Potassium: 4.6 mmol/L (ref 3.5–5.2)
Sodium: 139 mmol/L (ref 134–144)
Total Protein: 6.2 g/dL (ref 6.0–8.5)
eGFR: 77 mL/min/{1.73_m2} (ref >59)

## 2021-05-12 LAB — IRON,TIBC AND FERRITIN PANEL
Ferritin: 45 ng/mL (ref 15–150)
Iron Saturation: 22 % (ref 15–55)
Iron: 86 ug/dL (ref 27–139)
Total Iron Binding Capacity: 388 ug/dL (ref 250–450)
UIBC: 302 ug/dL (ref 118–369)

## 2021-05-12 LAB — LIPID PANEL WITH LDL/HDL RATIO
Cholesterol, Total: 130 mg/dL (ref 100–199)
HDL: 54 mg/dL (ref >39)
LDL Chol Calc (NIH): 49 mg/dL (ref 0–99)
LDL/HDL Ratio: 0.9 ratio (ref 0.0–3.2)
Triglycerides: 164 mg/dL — ABNORMAL HIGH (ref 0–149)
VLDL Cholesterol Cal: 27 mg/dL (ref 5–40)

## 2021-05-12 LAB — VITAMIN D 25 HYDROXY (VIT D DEFICIENCY, FRACTURES): Vit D, 25-Hydroxy: 77.6 ng/mL (ref 30.0–100.0)

## 2021-05-15 NOTE — Telephone Encounter (Signed)
Patient scheduled 05/23/21 with Dr. Caryl Comes at 11:40 am in [place].

## 2021-05-15 NOTE — Progress Notes (Signed)
Carelink Summary Report / Loop Recorder 

## 2021-05-16 ENCOUNTER — Other Ambulatory Visit: Payer: Self-pay | Admitting: Nurse Practitioner

## 2021-05-16 ENCOUNTER — Other Ambulatory Visit: Payer: Self-pay | Admitting: Physician Assistant

## 2021-05-16 ENCOUNTER — Telehealth: Payer: Self-pay

## 2021-05-16 ENCOUNTER — Other Ambulatory Visit: Payer: Self-pay | Admitting: Internal Medicine

## 2021-05-16 DIAGNOSIS — E039 Hypothyroidism, unspecified: Secondary | ICD-10-CM

## 2021-05-16 DIAGNOSIS — I10 Essential (primary) hypertension: Secondary | ICD-10-CM

## 2021-05-16 DIAGNOSIS — F411 Generalized anxiety disorder: Secondary | ICD-10-CM

## 2021-05-16 MED ORDER — LEVOTHYROXINE SODIUM 88 MCG PO TABS: 88.0000 ug | ORAL_TABLET | Freq: Every day | ORAL | 3 refills | Status: DC

## 2021-05-16 NOTE — Telephone Encounter (Signed)
Spoke to pt's daughter Larene Beach.  Informed her of the lab results and that we sent in synthroid 55 Mcg to he pharmacy and pt will need to re check labs in 6-8 weeks

## 2021-05-16 NOTE — Telephone Encounter (Signed)
-----   Message from Mylinda Latina, PA-C sent at 05/16/2021  2:52 PM EDT ----- Please let pt know her free t4 was low normal with very elevated TSH. Will increase to 47mcg of synthroid since she is symptomatic with increased fatigue. She should go to have her labs rechecked in 6-8 weeks (orders already placed)

## 2021-05-19 ENCOUNTER — Telehealth: Payer: Self-pay

## 2021-05-19 NOTE — Telephone Encounter (Signed)
Faxed paper work to [facility] for home health aide

## 2021-05-23 ENCOUNTER — Ambulatory Visit (INDEPENDENT_AMBULATORY_CARE_PROVIDER_SITE_OTHER): Payer: Medicare Other | Admitting: Internal Medicine

## 2021-05-23 ENCOUNTER — Other Ambulatory Visit: Payer: Self-pay

## 2021-05-23 ENCOUNTER — Encounter: Payer: Self-pay | Admitting: Internal Medicine

## 2021-05-23 VITALS — BP 110/70 | HR 70 | Ht 64.0 in | Wt 215.0 lb

## 2021-05-23 DIAGNOSIS — I639 Cerebral infarction, unspecified: Secondary | ICD-10-CM | POA: Diagnosis not present

## 2021-05-23 DIAGNOSIS — I471 Supraventricular tachycardia: Secondary | ICD-10-CM | POA: Diagnosis not present

## 2021-05-23 DIAGNOSIS — Z959 Presence of cardiac and vascular implant and graft, unspecified: Secondary | ICD-10-CM | POA: Diagnosis not present

## 2021-05-23 LAB — CUP PACEART INCLINIC DEVICE CHECK
Date Time Interrogation Session: 20221004153224
Implantable Pulse Generator Implant Date: 20190516

## 2021-05-23 LAB — PACEMAKER DEVICE OBSERVATION

## 2021-05-23 MED ORDER — FLECAINIDE ACETATE 50 MG PO TABS: 50.0000 mg | ORAL_TABLET | Freq: Two times a day (BID) | ORAL | 6 refills | Status: DC

## 2021-05-23 NOTE — Patient Instructions (Addendum)
Medication Instructions:  - Your physician has recommended you make the following change in your medication:   1) START flecainide 50 mg: - take 1 tablet by mouth TWICE daily  2) STOP losartan  *If you need a refill on your cardiac medications before your next appointment, please call your pharmacy*   Lab Work: - none ordered  If you have labs (blood work) drawn today and your tests are completely normal, you will receive your results only by: [place] (if you have MyChart) OR A paper copy in the mail If you have any lab test that is abnormal or we need to change your treatment, we will call you to review the results.   Testing/Procedures: - Your physician recommends that you return for an EKG in 6 weeks- nurse visit (please schedule on a Tuesday/ Thursday) that Dr. Caryl Comes is in the clinic>> Nira Conn will do (anytime during the morning that is convenient for the patient)   Follow-Up: At [facility], you and your health needs are our priority.  As part of our continuing mission to provide you with exceptional heart care, we have created designated Provider Care Teams.  These Care Teams include your primary Cardiologist (physician) and Advanced Practice Providers (APPs -  Physician Assistants and Nurse Practitioners) who all work together to provide you with the care you need, when you need it.  We recommend signing up for the patient portal called "MyChart".  Sign up information is provided on this After Visit Summary.  MyChart is used to connect with patients for Virtual Visits (Telemedicine).  Patients are able to view lab/test results, encounter notes, upcoming appointments, etc.  Non-urgent messages can be sent to your provider as well.   To learn more about what you can do with MyChart, go to [email or web address].    Your next appointment:   6 month(s)  The format for your next appointment:   In Person  Provider:   Virl Axe, MD   Other  Instructions  Flecainide Tablets What is this medication? FLECAINIDE (FLEK a nide) prevents and treats a fast or irregular heartbeat (arrhythmia). It is often used to treat a type of arrhythmia known as AFib (atrial fibrillation). It works by slowing down overactive electric signals in the heart, which stabilizes your heart rhythm. It belongs to a group of medications called antiarrhythmics. This medicine may be used for other purposes; ask your health care provider or pharmacist if you have questions. COMMON BRAND NAME(S): Tambocor What should I tell my care team before I take this medication? They need to know if you have any of these conditions: Abnormal levels of potassium in the blood Heart disease including heart rhythm and heart rate problems Kidney or liver disease Recent heart attack An unusual or allergic reaction to flecainide, local anesthetics, other medications, foods, dyes, or preservatives Pregnant or trying to get pregnant Breast-feeding How should I use this medication? Take this medication by mouth with a glass of water. Follow the directions on the prescription label. You can take this medication with or without food. Take your doses at regular intervals. Do not take your medication more often than directed. Do not stop taking this medication suddenly. This may cause serious, heart-related side effects. If your care team wants you to stop the medication, the dose may be slowly lowered over time to avoid any side effects. Talk to your care team regarding the use of this medication in children. While this medication may be prescribed for children as young as  1 year of age for selected conditions, precautions do apply. Overdosage: If you think you have taken too much of this medicine contact a poison control center or emergency room at once. NOTE: This medicine is only for you. Do not share this medicine with others. What if I miss a dose? If you miss a dose, take it as soon as  you can. If it is almost time for your next dose, take only that dose. Do not take double or extra doses. What may interact with this medication? Do not take this medication with any of the following: Amoxapine Arsenic trioxide Certain antibiotics like clarithromycin, erythromycin, gatifloxacin, gemifloxacin, levofloxacin, moxifloxacin, sparfloxacin, or troleandomycin Certain antidepressants called tricyclic antidepressants like amitriptyline, imipramine, or nortriptyline Certain medications to control heart rhythm like disopyramide, encainide, moricizine, procainamide, propafenone, and quinidine Cisapride Delavirdine Droperidol Haloperidol [place] Imatinib Levomethadyl Maprotiline Medications for malaria like chloroquine and halofantrine Pentamidine Phenothiazines like chlorpromazine, mesoridazine, prochlorperazine, thioridazine Pimozide Quinine Ranolazine Ritonavir Sertindole This medication may also interact with the following: Cimetidine Dofetilide Medications for angina or high blood pressure Medications to control heart rhythm like amiodarone and digoxin Ziprasidone This list may not describe all possible interactions. Give your health care provider a list of all the medicines, herbs, non-prescription drugs, or dietary supplements you use. Also tell them if you smoke, drink alcohol, or use illegal drugs. Some items may interact with your medicine. What should I watch for while using this medication? Visit your care team for regular checks on your progress. Because your condition and the use of this medication carries some risk, it is a good idea to carry an identification card, necklace or bracelet with details of your condition, medications, and care team. Check your blood pressure and pulse rate regularly. Ask your care team what your blood pressure and pulse rate should be, and when you should contact them. Your care team also may schedule regular blood tests and  electrocardiograms to check your progress. You may get drowsy or dizzy. Do not drive, use machinery, or do anything that needs mental alertness until you know how this medication affects you. Do not stand or sit up quickly, especially if you are an older patient. This reduces the risk of dizzy or fainting spells. Alcohol can make you more dizzy, increase flushing and rapid heartbeats. Avoid alcoholic drinks. What side effects may I notice from receiving this medication? Side effects that you should report to your care team as soon as possible: Allergic reactions-skin rash, itching, hives, swelling of the face, lips, tongue, or throat Heart failure-shortness of breath, swelling of the ankles, feet, or hands, sudden weight gain, unusual weakness or fatigue Heart rhythm changes-fast or irregular heartbeat, dizziness, feeling faint or lightheaded, chest pain, trouble breathing Liver injury-right upper belly pain, loss of appetite, nausea, light-colored stool, dark yellow or brown urine, yellowing skin or eyes, unusual weakness or fatigue Side effects that usually do not require medical attention (report to your care team if they continue or are bothersome): Blurry vision Constipation Dizziness Fatigue Headache Nausea Tremors or shaking This list may not describe all possible side effects. Call your doctor for medical advice about side effects. You may report side effects to FDA at 1-800-FDA-1088. Where should I keep my medication? Keep out of the reach of children and pets. Store at room temperature between 15 and 30 degrees C (59 and 86 degrees F). Protect from light. Keep container tightly closed. Throw away any unused medication after the expiration date. NOTE: This sheet is  a summary. It may not cover all possible information. If you have questions about this medicine, talk to your doctor, pharmacist, or health care provider.  2022 Elsevier/Gold Standard (2020-09-08 12:17:39)

## 2021-05-23 NOTE — Progress Notes (Signed)
Patient Care Team: Lavera Guise, MD as PCP - General (Internal Medicine)   HPI  [name] Douglas is a 80 y.o. female Seen in followup for a loop recorder implanted for Cryptogenic Stroke 5/19  She is seen today because of an episode 9/22 of adenosine responsive SVT EMS record is not available, but reportedly "heart rates in the 170s.  Was given a 6 mg of adenosine with conversion to sinus rhythm.  "  Lightheaded and short of breath.  In general, back pain is more limiting but does have some mild shortness of breath but no chest pain.  No edema.     Currently taking ASA and plavix.  Without bleeding.  DATE TEST EF   5/19 Echo   55-60 %   11/21 Echo   55-60 % LA normal         Date Cr K Hgb TSH  12/21 0.85 3.8 10.6<,11.7    9/22 0.77 4.6 11.3 15.5     On an interrogation 3/21 >> afib and mssg was sent to discuss anticoagulation but apparently never happened and then neuro ( thankfully) saw that note and reached out for [place] to followup   Thromboembolic risk factors ( age  -2, HTN-1, TIA/CVA-2, Gender-1) for a CHADSVASc Score of >=6   Records and Results Reviewed   Past Medical History:  Diagnosis Date   Anxiety    Arthritis    "shoulders" (08/20/2014)   Asthma    Chronic lower back pain    Complication of anesthesia    "agitated & restless after knee replacement in 2008"   COPD (chronic obstructive pulmonary disease) ([place])    DDD (degenerative disc disease), lumbar    Depression    Diabetes mellitus without complication ([place])    Diverticulosis    Family history of adverse reaction to anesthesia    GERD (gastroesophageal reflux disease)    High cholesterol    Hypertension    Hypothyroidism    Memory loss    mild   On home oxygen therapy    "2L at night" (08/20/2014)   PAF (paroxysmal atrial fibrillation) ([place])    Peripheral vascular disease ([place])    Pre-diabetes    Shortness of breath dyspnea    with exertion   Stroke ([place])    mild, no deficits     Past Surgical History:  Procedure Laterality Date   CARDIAC CATHETERIZATION  ~ 2007   EXCISIONAL HEMORRHOIDECTOMY  1960's   EYE SURGERY Bilateral    Cataract extraction with IOL   KNEE ARTHROSCOPY Left X 2 <2008   KNEE ARTHROSCOPY WITH SUBCHONDROPLASTY Right 04/19/2016   Procedure: KNEE ARTHROSCOPY WITH SUBCHONDROPLASTY, PARTIAL MENISCECTOMY;  Surgeon: Hessie Knows, MD;  Location: [facility];  Service: Orthopedics;  Laterality: Right;   LOOP RECORDER INSERTION N/A 01/02/2018   Procedure: LOOP RECORDER INSERTION;  Surgeon: Deboraha Sprang, MD;  Location: [facility];  Service: Cardiovascular;  Laterality: N/A;   LUMBAR LAMINECTOMY/DECOMPRESSION MICRODISCECTOMY N/A 02/06/2021   Procedure: Lumbar Three-Four, Lumbar Four-Five Laminectomy and Foraminotomy;  Surgeon: Kristeen Miss, MD;  Location: [facility];  Service: Neurosurgery;  Laterality: N/A;   SHOULDER OPEN ROTATOR CUFF REPAIR Right 2001   TONSILLECTOMY  ~ [place] ARTHROPLASTY Left 2008   TOTAL KNEE ARTHROPLASTY Right 07/03/2016   Procedure: TOTAL KNEE ARTHROPLASTY;  Surgeon: Hessie Knows, MD;  Location: [facility];  Service: Orthopedics;  Laterality: Right;   TUBAL LIGATION  1973   VAGINAL  HYSTERECTOMY  1974    Current Meds  Medication Sig   amLODipine (NORVASC) 5 MG tablet TAKE 1 TABLET BY MOUTH DAILY   apixaban (ELIQUIS) 5 MG TABS tablet Take 1 tablet (5 mg total) by mouth 2 (two) times daily.   atorvastatin (LIPITOR) 80 MG tablet Take 1 tablet (80 mg total) by mouth daily at 6 PM.   cyclobenzaprine (FLEXERIL) 5 MG tablet TAKE 1 TABLET BY MOUTH AT BEDTIME   donepezil (ARICEPT) 5 MG tablet Take 1 tablet (5 mg total) by mouth at bedtime.   gabapentin (NEURONTIN) 100 MG capsule Take 100 mg by mouth 3 (three) times daily.   levothyroxine (SYNTHROID) 88 MCG tablet Take 1 tablet (88 mcg total) by mouth daily.   losartan (COZAAR) 25 MG tablet TAKE 1 TABLET BY MOUTH DAILY   metoprolol succinate (TOPROL-XL) 50 MG 24 hr tablet  TAKE 2 TABLETS BY MOUTH DAILY   venlafaxine XR (EFFEXOR-XR) 75 MG 24 hr capsule TAKE 2 CAPSULES BY MOUTH DAILY   Vitamin D, Ergocalciferol, (DRISDOL) 1.25 MG (50000 UNIT) CAPS capsule TAKE 1 CAPSULE BY MOUTH EVERY FRIDAY.    Allergies  Allergen Reactions   Heparin Nausea And Vomiting      Review of Systems negative except from HPI and PMH  Physical Exam BP 110/70 (BP Location: Left Arm, Patient Position: Sitting, Cuff Size: Large)   Pulse 70   Ht 5\' 4"  (1.626 m)   Wt 215 lb (97.5 kg)   SpO2 94%   BMI 36.90 kg/m  Well developed and well nourished in no acute distress HENT normal Neck supple with JVP-flat Clear Device pocket well healed; without hematoma or erythema.  There is no tethering  Regular rate and rhythm, no  gallop No / murmur Abd-soft with active BS No Clubbing cyanosis  edema Skin-warm and dry A & Oriented  Grossly normal sensory and motor function  ECG sinus at 70 Intervals 14/08/41 Otherwise normal    Estimated Creatinine Clearance: 62.5 mL/min (by C-G formula based on SCr of 0.77 mg/dL).   Assessment and  Plan  Atrial fib paroxysmal  LINQ implantable loop  Hypertension9  Stroke prior  HFpEF  Anemia  Blood pressure well controlled.  We will continue her on amlodipine 5 Toprol 100.  We will discontinue her losartan   Recurrent SVT.  Device interrogation demonstrated episodes September 21 x2, November 21, February 22, March 22, and September 22.  The device was reprogrammed because I think there was oscillating around the detection rate with decreased detection of 154 bpm.  Review of the electrogram, I had to take a picture and enlarging, is most consistent with atrial tachycardia with a morphologic change in the T wave preceding the tachycardia suggesting the description of a P wave without PR prolongation.  Hence, we have elected to initiate flecainide 50 mg twice daily as a therapeutic strategy.  She is disinclined towards ablation as am I in  this 80 year old lady with an atrial tachycardia.  On Eliquis for her atrial fibrillation.  No bleeding.  Continue 5 mg twice daily    TSH was elevated with her PCP as she has increased the dose of her Synthroid`       Current medicines are reviewed at length with the patient today .  The patient does not  have concerns regarding medicines.

## 2021-05-25 ENCOUNTER — Ambulatory Visit: Payer: Medicare Other | Admitting: Physician Assistant

## 2021-06-05 ENCOUNTER — Ambulatory Visit (INDEPENDENT_AMBULATORY_CARE_PROVIDER_SITE_OTHER): Payer: Medicare Other

## 2021-06-05 ENCOUNTER — Ambulatory Visit (INDEPENDENT_AMBULATORY_CARE_PROVIDER_SITE_OTHER): Payer: Medicare Other | Admitting: *Deleted

## 2021-06-05 ENCOUNTER — Other Ambulatory Visit: Payer: Self-pay

## 2021-06-05 ENCOUNTER — Other Ambulatory Visit: Payer: Self-pay | Admitting: Internal Medicine

## 2021-06-05 VITALS — BP 136/68 | HR 61 | Ht 64.0 in | Wt 214.2 lb

## 2021-06-05 DIAGNOSIS — Z79899 Other long term (current) drug therapy: Secondary | ICD-10-CM

## 2021-06-05 DIAGNOSIS — I471 Supraventricular tachycardia: Secondary | ICD-10-CM

## 2021-06-05 DIAGNOSIS — E559 Vitamin D deficiency, unspecified: Secondary | ICD-10-CM

## 2021-06-05 DIAGNOSIS — M62838 Other muscle spasm: Secondary | ICD-10-CM

## 2021-06-05 DIAGNOSIS — I6310 Cerebral infarction due to embolism of unspecified precerebral artery: Secondary | ICD-10-CM | POA: Diagnosis not present

## 2021-06-05 NOTE — Progress Notes (Signed)
Reason for visit: EKG  Name of MD requesting visit: Caryl Comes  H&P: Patient seen in office on 05/23/21 by Dr. Caryl Comes. She has an implantable loop recorder for Cryptogenic stroke. Her most recent office visit was to follow up on an episode of adenosine responsive SVT that occurred on 05/11/21. After further review of loop recorder data, Dr. Caryl Comes felt as though the patient had an atrial tachycardia and decided to try the patient on Flecainide 50 mg BID. The patient was advised to follow up in the office today for a nurse visit EKG.  ROS related to problem: Medications reviewed with the patient in detail. She did not take any medications prior to her appointment today. However, she advised that she has not experienced any noticeable side effects from starting the flecainide 50 mg BID dose.   Assessment and plan per MD: The patient was advised at her office visit on 05/23/21 that Dr. Caryl Comes would not be in the office this week when her EKG was performed. MD was also made aware of the date of the patient's EKG. Per Dr. Caryl Comes, ok to perform the EKG and hold for him to review on 06/13/21 when he returns to the clinic. I advised the patient again today that Dr. Caryl Comes will review her EKG when he is back in the office on 06/13/21, but I will have the DOD review in the interim. She is aware I will call her back once Dr. Caryl Comes signs off on this.   05/23/21 EKG: PRI- 142 , QRS- 80 ms/ QT/QTc- 408/440 ms  06/05/21 EKG: PRI- 158 ms/ QRS- 74 ms/ QT/QTc- 444/446 ms

## 2021-06-05 NOTE — Patient Instructions (Signed)
Medication Instructions:  - .instcur  *If you need a refill on your cardiac medications before your next appointment, please call your pharmacy*   Lab Work: - none ordered  If you have labs (blood work) drawn today and your tests are completely normal, you will receive your results only by: [place] (if you have MyChart) OR A paper copy in the mail If you have any lab test that is abnormal or we need to change your treatment, we will call you to review the results.   Testing/Procedures: - none ordered   Follow-Up: At [facility], you and your health needs are our priority.  As part of our continuing mission to provide you with exceptional heart care, we have created designated Provider Care Teams.  These Care Teams include your primary Cardiologist (physician) and Advanced Practice Providers (APPs -  Physician Assistants and Nurse Practitioners) who all work together to provide you with the care you need, when you need it.  We recommend signing up for the patient portal called "MyChart".  Sign up information is provided on this After Visit Summary.  MyChart is used to connect with patients for Virtual Visits (Telemedicine).  Patients are able to view lab/test results, encounter notes, upcoming appointments, etc.  Non-urgent messages can be sent to your provider as well.   To learn more about what you can do with MyChart, go to [email or web address].    Your next appointment:   As previously planned- April 2023   The format for your next appointment:   In Person  Provider:   Virl Axe, MD   Other Instructions - Dr. Olin Pia nurse, Nira Conn, will call you when he returns to the clinic next week and reviews your EKG tracing from today.

## 2021-06-07 LAB — CUP PACEART REMOTE DEVICE CHECK
Date Time Interrogation Session: 20221013014326
Implantable Pulse Generator Implant Date: 20190516

## 2021-06-14 NOTE — Progress Notes (Signed)
Carelink Summary Report / Loop Recorder 

## 2021-06-15 ENCOUNTER — Telehealth: Payer: Self-pay | Admitting: Internal Medicine

## 2021-06-15 NOTE — Telephone Encounter (Signed)
Dr. Caryl Comes reviewed the EKG done on 06/05/21 (Nurse Visit) to follow up on flecainide initiation. Per Dr. Caryl Comes, the EKG is WNL and the patient should continue her medications as currently prescribed.  Attempted to call the patient to follow up. No answer- I left a detailed message on her home voice mail (ok per DPK) with Dr. Olin Pia recommendations as above.  I asked that she call back with any further questions/ concerns.

## 2021-06-20 ENCOUNTER — Other Ambulatory Visit: Payer: Self-pay | Admitting: Nurse Practitioner

## 2021-06-20 DIAGNOSIS — I10 Essential (primary) hypertension: Secondary | ICD-10-CM

## 2021-06-22 ENCOUNTER — Inpatient Hospital Stay: Payer: Medicare Other | Admitting: Physician Assistant

## 2021-06-26 DIAGNOSIS — E559 Vitamin D deficiency, unspecified: Secondary | ICD-10-CM | POA: Diagnosis not present

## 2021-06-26 DIAGNOSIS — R5383 Other fatigue: Secondary | ICD-10-CM | POA: Diagnosis not present

## 2021-06-26 DIAGNOSIS — E782 Mixed hyperlipidemia: Secondary | ICD-10-CM | POA: Diagnosis not present

## 2021-06-27 LAB — COMPREHENSIVE METABOLIC PANEL
ALT: 11 IU/L (ref 0–32)
AST: 16 IU/L (ref 0–40)
Albumin/Globulin Ratio: 2.1 (ref 1.2–2.2)
Albumin: 3.9 g/dL (ref 3.6–4.6)
Alkaline Phosphatase: 103 IU/L (ref 44–121)
BUN/Creatinine Ratio: 13 (ref 12–28)
BUN: 12 mg/dL (ref 8–27)
Bilirubin Total: 0.2 mg/dL (ref 0.0–1.2)
CO2: 22 mmol/L (ref 20–29)
Calcium: 9.1 mg/dL (ref 8.7–10.3)
Chloride: 105 mmol/L (ref 96–106)
Creatinine, Ser: 0.89 mg/dL (ref 0.57–1.00)
Globulin, Total: 1.9 g/dL (ref 1.5–4.5)
Glucose: 71 mg/dL (ref 70–99)
Potassium: 4.5 mmol/L (ref 3.5–5.2)
Sodium: 142 mmol/L (ref 134–144)
Total Protein: 5.8 g/dL — ABNORMAL LOW (ref 6.0–8.5)
eGFR: 65 mL/min/{1.73_m2} (ref >59)

## 2021-06-27 LAB — LIPID PANEL
Chol/HDL Ratio: 2.3 ratio (ref 0.0–4.4)
Cholesterol, Total: 108 mg/dL (ref 100–199)
HDL: 46 mg/dL (ref >39)
LDL Chol Calc (NIH): 40 mg/dL (ref 0–99)
Triglycerides: 123 mg/dL (ref 0–149)
VLDL Cholesterol Cal: 22 mg/dL (ref 5–40)

## 2021-06-27 LAB — TSH+FREE T4
Free T4: 1.55 ng/dL (ref 0.82–1.77)
TSH: 1.48 u[IU]/mL (ref 0.450–4.500)

## 2021-06-27 LAB — VITAMIN D 25 HYDROXY (VIT D DEFICIENCY, FRACTURES): Vit D, 25-Hydroxy: 69.7 ng/mL (ref 30.0–100.0)

## 2021-07-05 LAB — CUP PACEART REMOTE DEVICE CHECK
Date Time Interrogation Session: 20221115011607
Implantable Pulse Generator Implant Date: 20190516

## 2021-07-10 ENCOUNTER — Ambulatory Visit (INDEPENDENT_AMBULATORY_CARE_PROVIDER_SITE_OTHER): Payer: Medicare Other

## 2021-07-10 DIAGNOSIS — I639 Cerebral infarction, unspecified: Secondary | ICD-10-CM | POA: Diagnosis not present

## 2021-07-17 ENCOUNTER — Telehealth: Payer: Self-pay

## 2021-07-17 NOTE — Telephone Encounter (Signed)
Linq alert received, device has reached RRT.    Attempted to reach patient, no answer.  DPR on file.  Left VM requesting callback to discuss next steps.    Carelink/ paceart have not been updated.

## 2021-07-18 NOTE — Telephone Encounter (Signed)
Incoming call from patient per request. Discussed linq RRT status. Provided patient with opportunity for removal if insurance approved. Patient would like to leave in place for now. She is instructed to unplug remote monitor and await return kit. Address confirmed. All future remote monitoring appointments cancelled. Patient marked inactive in [place]. Discontinued in [place]. Return kit sent. Patient appreciative of call and will call back if she decides to have device removed.

## 2021-07-19 ENCOUNTER — Other Ambulatory Visit: Payer: Self-pay | Admitting: Nurse Practitioner

## 2021-07-19 ENCOUNTER — Other Ambulatory Visit: Payer: Self-pay | Admitting: Internal Medicine

## 2021-07-19 NOTE — Progress Notes (Signed)
Carelink Summary Report / Loop Recorder 

## 2021-07-20 ENCOUNTER — Other Ambulatory Visit: Payer: Self-pay

## 2021-07-20 DIAGNOSIS — I10 Essential (primary) hypertension: Secondary | ICD-10-CM

## 2021-07-20 MED ORDER — DONEPEZIL HCL 5 MG PO TABS: 5.0000 mg | ORAL_TABLET | Freq: Every day | ORAL | 1 refills | Status: DC

## 2021-07-20 MED ORDER — ATORVASTATIN CALCIUM 80 MG PO TABS: ORAL_TABLET | ORAL | 1 refills | Status: DC

## 2021-07-21 ENCOUNTER — Telehealth: Payer: Self-pay | Admitting: Internal Medicine

## 2021-07-21 ENCOUNTER — Telehealth: Payer: Self-pay

## 2021-07-21 NOTE — Telephone Encounter (Signed)
Pt daughter called to verify that pt is on losartan or not advised her D/c by hospital and current med she is on for bp is amlodipine and metoprolol

## 2021-07-21 NOTE — Telephone Encounter (Signed)
Called and left detailed message with Larene Beach (pt's daughter, DPR approved).  Confirmed that it is correct that pt should be taking both Eliquis 5 mg 1 tablets TWICE daily and Flecainide 50 mg 1 tablet TWICE daily.   Pt last seen by Dr. Caryl Comes 05/23/21 - Flecainide 50 mg BID was initiated and pt taking Eliquis 5 mg BID at visit.  Follow up lab results 06/15/21 stating pt to continue current medications.   Asked Larene Beach to call back with any further questions.

## 2021-07-21 NOTE — Telephone Encounter (Signed)
Patient daughter calling Would like medication clarification on whether or not she is to be taking both Eliquis 5 MG 1 tablet 2 times daily and flecainide 50 MG 1 tablet 2 times daily or just flecainide  Ok to leave a detailed VM for daughter on [phone]

## 2021-08-08 ENCOUNTER — Telehealth: Payer: Self-pay | Admitting: Internal Medicine

## 2021-08-08 NOTE — Telephone Encounter (Signed)
Patient came by office  States that she is wanting her Linq recorder removed  Please call to discuss if this will be done at [facility] or if it can be done during an office visit with Dr Caryl Comes

## 2021-08-09 ENCOUNTER — Encounter: Payer: Self-pay | Admitting: Internal Medicine

## 2021-08-10 ENCOUNTER — Ambulatory Visit (INDEPENDENT_AMBULATORY_CARE_PROVIDER_SITE_OTHER): Payer: Medicare Other | Admitting: Physician Assistant

## 2021-08-10 ENCOUNTER — Encounter: Payer: Self-pay | Admitting: Physician Assistant

## 2021-08-10 ENCOUNTER — Other Ambulatory Visit: Payer: Self-pay

## 2021-08-10 VITALS — BP 151/63 | HR 88 | Temp 98.4°F | Resp 16 | Ht 64.0 in | Wt 210.0 lb

## 2021-08-10 DIAGNOSIS — I480 Paroxysmal atrial fibrillation: Secondary | ICD-10-CM | POA: Diagnosis not present

## 2021-08-10 DIAGNOSIS — I10 Essential (primary) hypertension: Secondary | ICD-10-CM | POA: Diagnosis not present

## 2021-08-10 DIAGNOSIS — E559 Vitamin D deficiency, unspecified: Secondary | ICD-10-CM

## 2021-08-10 DIAGNOSIS — E119 Type 2 diabetes mellitus without complications: Secondary | ICD-10-CM | POA: Diagnosis not present

## 2021-08-10 DIAGNOSIS — M62838 Other muscle spasm: Secondary | ICD-10-CM

## 2021-08-10 DIAGNOSIS — M5442 Lumbago with sciatica, left side: Secondary | ICD-10-CM

## 2021-08-10 DIAGNOSIS — E039 Hypothyroidism, unspecified: Secondary | ICD-10-CM | POA: Diagnosis not present

## 2021-08-10 LAB — POCT GLYCOSYLATED HEMOGLOBIN (HGB A1C): Hemoglobin A1C: 6 % — AB (ref 4.0–5.6)

## 2021-08-10 MED ORDER — GABAPENTIN 100 MG PO CAPS: 100.0000 mg | ORAL_CAPSULE | Freq: Three times a day (TID) | ORAL | 2 refills | Status: DC

## 2021-08-10 MED ORDER — METOPROLOL SUCCINATE ER 50 MG PO TB24: 100.0000 mg | ORAL_TABLET | Freq: Every day | ORAL | 1 refills | Status: DC

## 2021-08-10 MED ORDER — CYCLOBENZAPRINE HCL 5 MG PO TABS: 5.0000 mg | ORAL_TABLET | Freq: Every day | ORAL | 1 refills | Status: DC

## 2021-08-10 MED ORDER — VITAMIN D (ERGOCALCIFEROL) 1.25 MG (50000 UNIT) PO CAPS: ORAL_CAPSULE | ORAL | 3 refills | Status: DC

## 2021-08-10 NOTE — Telephone Encounter (Signed)
Thursday 08/31/21 at 8:20 am or Thursday 09/14/21 at 8:20 am would be my next available.

## 2021-08-10 NOTE — Progress Notes (Signed)
[facility] [place], [place] [address]  Internal MEDICINE  Office Visit Note  Patient Name: [name] Douglas  [id number]  [id number]  Date of Service: 08/21/2021  Chief Complaint  Patient presents with   Follow-up   Depression   Diabetes   Hypertension   Hyperlipidemia   Gastroesophageal Reflux   Medication Refill    Brought medications that need to be refilled, also needs Gabapentin but did not bring it with her   Quality Metric Gaps    Would like to get a flu shot    HPI Pt is here for routine follow up and has no complaints today -She needs refills on several medications -Has been doing well since last visit and has had no repeat issues with palpitations or dizziness -She is being followed by cardiology -Her repeat TSH/free T4 improved with the increase in synthroid -Other labs look good except low protein -Does not check BG at home usually, but well controlled in office today -needs flu shot and will go to pharmacy since we are out in the office  Current Medication: Outpatient Encounter Medications as of 08/10/2021  Medication Sig Note   amLODipine (NORVASC) 5 MG tablet TAKE 1 TABLET BY MOUTH DAILY    apixaban (ELIQUIS) 5 MG TABS tablet Take 1 tablet (5 mg total) by mouth 2 (two) times daily.    atorvastatin (LIPITOR) 80 MG tablet TAKE 1 TABLET BY MOUTH DAILY AT 6 PM    donepezil (ARICEPT) 5 MG tablet Take 1 tablet (5 mg total) by mouth at bedtime.    flecainide (TAMBOCOR) 50 MG tablet Take 1 tablet (50 mg total) by mouth 2 (two) times daily.    levothyroxine (SYNTHROID) 88 MCG tablet Take 1 tablet (88 mcg total) by mouth daily.    venlafaxine XR (EFFEXOR-XR) 75 MG 24 hr capsule TAKE 2 CAPSULES BY MOUTH DAILY    [DISCONTINUED] cyclobenzaprine (FLEXERIL) 5 MG tablet TAKE 1 TABLET BY MOUTH AT BEDTIME.    [DISCONTINUED] gabapentin (NEURONTIN) 100 MG capsule Take 100 mg by mouth 3 (three) times daily. 05/03/2021: LF 7.13.22   [DISCONTINUED]  metoprolol succinate (TOPROL-XL) 50 MG 24 hr tablet TAKE 2 TABLETS BY MOUTH DAILY    [DISCONTINUED] Vitamin D, Ergocalciferol, (DRISDOL) 1.25 MG (50000 UNIT) CAPS capsule TAKE 1 CAPSULE BY MOUTH EVERY FRIDAY.    cyclobenzaprine (FLEXERIL) 5 MG tablet Take 1 tablet (5 mg total) by mouth at bedtime.    gabapentin (NEURONTIN) 100 MG capsule Take 1 capsule (100 mg total) by mouth 3 (three) times daily.    metoprolol succinate (TOPROL-XL) 50 MG 24 hr tablet Take 2 tablets (100 mg total) by mouth daily. Take with or immediately following a meal.    Vitamin D, Ergocalciferol, (DRISDOL) 1.25 MG (50000 UNIT) CAPS capsule TAKE 1 CAPSULE BY MOUTH EVERY FRIDAY.    No facility-administered encounter medications on file as of 08/10/2021.    Surgical History: Past Surgical History:  Procedure Laterality Date   CARDIAC CATHETERIZATION  ~ 2007   EXCISIONAL HEMORRHOIDECTOMY  1960's   EYE SURGERY Bilateral    Cataract extraction with IOL   KNEE ARTHROSCOPY Left X 2 <2008   KNEE ARTHROSCOPY WITH SUBCHONDROPLASTY Right 04/19/2016   Procedure: KNEE ARTHROSCOPY WITH SUBCHONDROPLASTY, PARTIAL MENISCECTOMY;  Surgeon: Hessie Knows, MD;  Location: [facility];  Service: Orthopedics;  Laterality: Right;   LOOP RECORDER INSERTION N/A 01/02/2018   Procedure: LOOP RECORDER INSERTION;  Surgeon: Deboraha Sprang, MD;  Location: [facility];  Service:  Cardiovascular;  Laterality: N/A;   LUMBAR LAMINECTOMY/DECOMPRESSION MICRODISCECTOMY N/A 02/06/2021   Procedure: Lumbar Three-Four, Lumbar Four-Five Laminectomy and Foraminotomy;  Surgeon: Kristeen Miss, MD;  Location: [place];  Service: Neurosurgery;  Laterality: N/A;   SHOULDER OPEN ROTATOR CUFF REPAIR Right 2001   TONSILLECTOMY  ~ [place] ARTHROPLASTY Left 2008   TOTAL KNEE ARTHROPLASTY Right 07/03/2016   Procedure: TOTAL KNEE ARTHROPLASTY;  Surgeon: Hessie Knows, MD;  Location: [facility];  Service: Orthopedics;  Laterality: Right;   TUBAL LIGATION  1973    VAGINAL HYSTERECTOMY  1974    Medical History: Past Medical History:  Diagnosis Date   Anxiety    Arthritis    "shoulders" (08/20/2014)   Asthma    Chronic lower back pain    Complication of anesthesia    "agitated & restless after knee replacement in 2008"   COPD (chronic obstructive pulmonary disease) ([facility])    DDD (degenerative disc disease), lumbar    Depression    Diabetes mellitus without complication ([facility])    Diverticulosis    Family history of adverse reaction to anesthesia    GERD (gastroesophageal reflux disease)    High cholesterol    Hypertension    Hypothyroidism    Memory loss    mild   On home oxygen therapy    "2L at night" (08/20/2014)   PAF (paroxysmal atrial fibrillation) ([facility])    Peripheral vascular disease ([facility])    Pre-diabetes    Shortness of breath dyspnea    with exertion   Stroke ([facility])    mild, no deficits    Family History: Family History  Problem Relation Age of Onset   Diabetes Mother    Hypertension Mother    Lung cancer Mother    Congestive Heart Failure Father    Congestive Heart Failure Sister     Social History   Socioeconomic History   Marital status: Divorced    Spouse name: Not on file   Number of children: Not on file   Years of education: Not on file   Highest education level: Not on file  Occupational History   Not on file  Tobacco Use   Smoking status: Every Day    Packs/day: 0.25    Years: 53.00    Pack years: 13.25    Types: Cigarettes   Smokeless tobacco: Never   Tobacco comments:    3 cigarettes in week  Vaping Use   Vaping Use: Never used  Substance and Sexual Activity   Alcohol use: No   Drug use: No   Sexual activity: Not Currently  Other Topics Concern   Not on file  Social History Narrative   Not on file   Social Determinants of Health   Financial Resource Strain: Not on file  Food Insecurity: Not on file  Transportation Needs: Not on file  Physical Activity: Not on file  Stress: Not on file   Social Connections: Not on file  Intimate Partner Violence: Not on file      Review of Systems  Constitutional:  Positive for fatigue. Negative for chills and unexpected weight change.  HENT:  Negative for congestion, postnasal drip, rhinorrhea, sneezing and sore throat.   Eyes:  Negative for redness.  Respiratory:  Negative for cough, chest tightness and shortness of breath.   Cardiovascular:  Negative for chest pain and palpitations.  Gastrointestinal:  Negative for abdominal pain, constipation, diarrhea, nausea and vomiting.  Genitourinary:  Negative for dysuria and frequency.  Musculoskeletal:  Positive for arthralgias and back pain. Negative for joint swelling and neck pain.  Skin:  Negative for rash.  Neurological: Negative.  Negative for tremors and numbness.  Hematological:  Negative for adenopathy. Does not bruise/bleed easily.  Psychiatric/Behavioral:  Negative for behavioral problems (Depression), sleep disturbance and suicidal ideas. The patient is not nervous/anxious.    Vital Signs: BP (!) 151/63    Pulse 88    Temp 98.4 F (36.9 C)    Resp 16    Ht 5\' 4"  (1.626 m)    Wt 210 lb (95.3 kg)    SpO2 97%    BMI 36.05 kg/m    Physical Exam Vitals and nursing note reviewed.  Constitutional:      General: She is not in acute distress.    Appearance: She is well-developed. She is obese. She is not diaphoretic.  HENT:     Head: Normocephalic and atraumatic.     Mouth/Throat:     Pharynx: No oropharyngeal exudate.  Eyes:     Pupils: Pupils are equal, round, and reactive to light.  Neck:     Thyroid: No thyromegaly.     Vascular: No JVD.     Trachea: No tracheal deviation.  Cardiovascular:     Rate and Rhythm: Normal rate and regular rhythm.     Heart sounds: Normal heart sounds. No murmur heard.   No friction rub. No gallop.  Pulmonary:     Effort: Pulmonary effort is normal. No respiratory distress.     Breath sounds: No wheezing or rales.  Chest:     Chest  wall: No tenderness.  Abdominal:     General: Bowel sounds are normal.     Palpations: Abdomen is soft.  Musculoskeletal:        General: Normal range of motion.     Cervical back: Normal range of motion and neck supple.  Lymphadenopathy:     Cervical: No cervical adenopathy.  Skin:    General: Skin is warm and dry.  Neurological:     Mental Status: She is alert and oriented to person, place, and time.     Cranial Nerves: No cranial nerve deficit.  Psychiatric:        Behavior: Behavior normal.        Thought Content: Thought content normal.        Judgment: Judgment normal.       Assessment/Plan: 1. Diabetes mellitus without complication ([place]) - POCT HgB A1C is 6.0 which is improved from 6.2 last visit. Continue to control with diet and exercise.  2. Essential hypertension Slightly elevated in office, will monitor and continue current medications - metoprolol succinate (TOPROL-XL) 50 MG 24 hr tablet; Take 2 tablets (100 mg total) by mouth daily. Take with or immediately following a meal.  Dispense: 180 tablet; Refill: 1  3. PAF (paroxysmal atrial fibrillation) ([place]) Followed by cardiology  4. Acquired hypothyroidism Stable, continue current medications  5. Muscle spasm May take flexeril as needed - cyclobenzaprine (FLEXERIL) 5 MG tablet; Take 1 tablet (5 mg total) by mouth at bedtime.  Dispense: 30 tablet; Refill: 1  6. Low back pain with left-sided sciatica, unspecified back pain laterality, unspecified chronicity - gabapentin (NEURONTIN) 100 MG capsule; Take 1 capsule (100 mg total) by mouth 3 (three) times daily.  Dispense: 180 capsule; Refill: 2  7. Vitamin D deficiency - Vitamin D, Ergocalciferol, (DRISDOL) 1.25 MG (50000 UNIT) CAPS capsule; TAKE 1 CAPSULE BY MOUTH EVERY FRIDAY.  Dispense: 4 capsule; Refill: 3  General Counseling: hasna stefanik understanding of the findings of todays visit and agrees with plan of treatment. I have discussed any further  diagnostic evaluation that may be needed or ordered today. We also reviewed her medications today. she has been encouraged to call the office with any questions or concerns that should arise related to todays visit.    Orders Placed This Encounter  Procedures   POCT HgB A1C    Meds ordered this encounter  Medications   cyclobenzaprine (FLEXERIL) 5 MG tablet    Sig: Take 1 tablet (5 mg total) by mouth at bedtime.    Dispense:  30 tablet    Refill:  1   Vitamin D, Ergocalciferol, (DRISDOL) 1.25 MG (50000 UNIT) CAPS capsule    Sig: TAKE 1 CAPSULE BY MOUTH EVERY FRIDAY.    Dispense:  4 capsule    Refill:  3   metoprolol succinate (TOPROL-XL) 50 MG 24 hr tablet    Sig: Take 2 tablets (100 mg total) by mouth daily. Take with or immediately following a meal.    Dispense:  180 tablet    Refill:  1   gabapentin (NEURONTIN) 100 MG capsule    Sig: Take 1 capsule (100 mg total) by mouth 3 (three) times daily.    Dispense:  180 capsule    Refill:  2    This patient was seen by Drema Dallas, PA-C in collaboration with Dr. Clayborn Bigness as a part of collaborative care agreement.   Total time spent:30 Minutes Time spent includes review of chart, medications, test results, and follow up plan with the patient.      Dr Lavera Guise Internal medicine

## 2021-08-16 ENCOUNTER — Encounter: Payer: Self-pay | Admitting: Nurse Practitioner

## 2021-08-16 ENCOUNTER — Telehealth (INDEPENDENT_AMBULATORY_CARE_PROVIDER_SITE_OTHER): Payer: Medicare Other | Admitting: Nurse Practitioner

## 2021-08-16 VITALS — Ht 64.0 in | Wt 210.0 lb

## 2021-08-16 DIAGNOSIS — B9689 Other specified bacterial agents as the cause of diseases classified elsewhere: Secondary | ICD-10-CM | POA: Diagnosis not present

## 2021-08-16 DIAGNOSIS — J028 Acute pharyngitis due to other specified organisms: Secondary | ICD-10-CM | POA: Diagnosis not present

## 2021-08-16 DIAGNOSIS — R051 Acute cough: Secondary | ICD-10-CM

## 2021-08-16 MED ORDER — BENZONATATE 100 MG PO CAPS: 100.0000 mg | ORAL_CAPSULE | Freq: Two times a day (BID) | ORAL | 0 refills | Status: DC | PRN

## 2021-08-16 MED ORDER — AMOXICILLIN-POT CLAVULANATE 875-125 MG PO TABS: 1.0000 | ORAL_TABLET | Freq: Two times a day (BID) | ORAL | 0 refills | Status: DC

## 2021-08-16 NOTE — Progress Notes (Signed)
[facility] [place], [place] [address]  Internal MEDICINE  Telephone Visit  Patient Name: [name] Douglas  [id number]  [id number]  Date of Service: 08/16/2021  I connected with the patient at 11:00 AM by telephone and verified the patients identity using two identifiers.   I discussed the limitations, risks, security and privacy concerns of performing an evaluation and management service by telephone and the availability of in person appointments. I also discussed with the patient that there may be a patient responsible charge related to the service.  The patient expressed understanding and agrees to proceed.    Chief Complaint  Patient presents with   Telephone Assessment    Going on for 3 days    Telephone Screen    [phone]   Sore Throat   Cough    HPI Regana presents for a telehealth virtual visit for symptoms of sinusitis, pharyngitis. She reports cough and sore throat x3 days. She is unable to test for covid.    Current Medication: Outpatient Encounter Medications as of 08/16/2021  Medication Sig   amLODipine (NORVASC) 5 MG tablet TAKE 1 TABLET BY MOUTH DAILY   amoxicillin-clavulanate (AUGMENTIN) 875-125 MG tablet Take 1 tablet by mouth 2 (two) times daily.   apixaban (ELIQUIS) 5 MG TABS tablet Take 1 tablet (5 mg total) by mouth 2 (two) times daily.   atorvastatin (LIPITOR) 80 MG tablet TAKE 1 TABLET BY MOUTH DAILY AT 6 PM   benzonatate (TESSALON) 100 MG capsule Take 1 capsule (100 mg total) by mouth 2 (two) times daily as needed for cough.   cyclobenzaprine (FLEXERIL) 5 MG tablet Take 1 tablet (5 mg total) by mouth at bedtime.   donepezil (ARICEPT) 5 MG tablet Take 1 tablet (5 mg total) by mouth at bedtime.   flecainide (TAMBOCOR) 50 MG tablet Take 1 tablet (50 mg total) by mouth 2 (two) times daily.   gabapentin (NEURONTIN) 100 MG capsule Take 1 capsule (100 mg total) by mouth 3 (three) times daily.   levothyroxine (SYNTHROID) 88 MCG tablet  Take 1 tablet (88 mcg total) by mouth daily.   metoprolol succinate (TOPROL-XL) 50 MG 24 hr tablet Take 2 tablets (100 mg total) by mouth daily. Take with or immediately following a meal.   venlafaxine XR (EFFEXOR-XR) 75 MG 24 hr capsule TAKE 2 CAPSULES BY MOUTH DAILY   Vitamin D, Ergocalciferol, (DRISDOL) 1.25 MG (50000 UNIT) CAPS capsule TAKE 1 CAPSULE BY MOUTH EVERY FRIDAY.   No facility-administered encounter medications on file as of 08/16/2021.    Surgical History: Past Surgical History:  Procedure Laterality Date   CARDIAC CATHETERIZATION  ~ 2007   EXCISIONAL HEMORRHOIDECTOMY  1960's   EYE SURGERY Bilateral    Cataract extraction with IOL   KNEE ARTHROSCOPY Left X 2 <2008   KNEE ARTHROSCOPY WITH SUBCHONDROPLASTY Right 04/19/2016   Procedure: KNEE ARTHROSCOPY WITH SUBCHONDROPLASTY, PARTIAL MENISCECTOMY;  Surgeon: Hessie Knows, MD;  Location: [facility];  Service: Orthopedics;  Laterality: Right;   LOOP RECORDER INSERTION N/A 01/02/2018   Procedure: LOOP RECORDER INSERTION;  Surgeon: Deboraha Sprang, MD;  Location: [place] [facility];  Service: Cardiovascular;  Laterality: N/A;   LUMBAR LAMINECTOMY/DECOMPRESSION MICRODISCECTOMY N/A 02/06/2021   Procedure: Lumbar Three-Four, Lumbar Four-Five Laminectomy and Foraminotomy;  Surgeon: Kristeen Miss, MD;  Location: [place];  Service: Neurosurgery;  Laterality: N/A;   SHOULDER OPEN ROTATOR CUFF REPAIR Right 2001   TONSILLECTOMY  ~ [place] ARTHROPLASTY Left 2008   TOTAL  KNEE ARTHROPLASTY Right 07/03/2016   Procedure: TOTAL KNEE ARTHROPLASTY;  Surgeon: Hessie Knows, MD;  Location: [facility];  Service: Orthopedics;  Laterality: Right;   TUBAL LIGATION  1973   VAGINAL HYSTERECTOMY  1974    Medical History: Past Medical History:  Diagnosis Date   Anxiety    Arthritis    "shoulders" (08/20/2014)   Asthma    Chronic lower back pain    Complication of anesthesia    "agitated & restless after knee replacement in 2008"   COPD (chronic  obstructive pulmonary disease) (HCC)    DDD (degenerative disc disease), lumbar    Depression    Diabetes mellitus without complication (HCC)    Diverticulosis    Family history of adverse reaction to anesthesia    GERD (gastroesophageal reflux disease)    High cholesterol    Hypertension    Hypothyroidism    Memory loss    mild   On home oxygen therapy    "2L at night" (08/20/2014)   PAF (paroxysmal atrial fibrillation) (HCC)    Peripheral vascular disease (HCC)    Pre-diabetes    Shortness of breath dyspnea    with exertion   Stroke (HCC)    mild, no deficits    Family History: Family History  Problem Relation Age of Onset   Diabetes Mother    Hypertension Mother    Lung cancer Mother    Congestive Heart Failure Father    Congestive Heart Failure Sister     Social History   Socioeconomic History   Marital status: Divorced    Spouse name: Not on file   Number of children: Not on file   Years of education: Not on file   Highest education level: Not on file  Occupational History   Not on file  Tobacco Use   Smoking status: Every Day    Packs/day: 0.25    Years: 53.00    Pack years: 13.25    Types: Cigarettes   Smokeless tobacco: Never   Tobacco comments:    3 cigarettes in week  Vaping Use   Vaping Use: Never used  Substance and Sexual Activity   Alcohol use: No   Drug use: No   Sexual activity: Not Currently  Other Topics Concern   Not on file  Social History Narrative   Not on file   Social Determinants of Health   Financial Resource Strain: Not on file  Food Insecurity: Not on file  Transportation Needs: Not on file  Physical Activity: Not on file  Stress: Not on file  Social Connections: Not on file  Intimate Partner Violence: Not on file      Review of Systems  Constitutional:  Negative for chills, fatigue and fever.  HENT:  Positive for congestion, postnasal drip, rhinorrhea, sinus pressure, sinus pain and sore throat. Negative for ear  pain.   Respiratory:  Positive for cough.   Cardiovascular:  Negative for chest pain and palpitations.  Gastrointestinal:  Negative for abdominal pain, constipation, diarrhea, nausea and vomiting.  Musculoskeletal:  Negative for myalgias.  Neurological:  Negative for headaches.   Vital Signs: Ht 5\' 4"  (1.626 m)    Wt 210 lb (95.3 kg)    BMI 36.05 kg/m    Observation/Objective: She is alert and oriented and engages in conversation appropriately. She does not sound as though she is in any acute distress over telephone call.     Assessment/Plan: 1. Acute bacterial pharyngitis Empiric antibiotic treatment prescribed - amoxicillin-clavulanate (AUGMENTIN)  875-125 MG tablet; Take 1 tablet by mouth 2 (two) times daily.  Dispense: 20 tablet; Refill: 0  2. Acute cough Medication prescribed for symptomatic treatment of cough. - benzonatate (TESSALON) 100 MG capsule; Take 1 capsule (100 mg total) by mouth 2 (two) times daily as needed for cough.  Dispense: 30 capsule; Refill: 0   General Counseling: Sandy Salaam understanding of the findings of today's phone visit and agrees with plan of treatment. I have discussed any further diagnostic evaluation that may be needed or ordered today. We also reviewed her medications today. she has been encouraged to call the office with any questions or concerns that should arise related to todays visit.  Return if symptoms worsen or fail to improve.   No orders of the defined types were placed in this encounter.   Meds ordered this encounter  Medications   benzonatate (TESSALON) 100 MG capsule    Sig: Take 1 capsule (100 mg total) by mouth 2 (two) times daily as needed for cough.    Dispense:  30 capsule    Refill:  0   amoxicillin-clavulanate (AUGMENTIN) 875-125 MG tablet    Sig: Take 1 tablet by mouth 2 (two) times daily.    Dispense:  20 tablet    Refill:  0    Time spent:10 Minutes Time spent with patient included reviewing progress  notes, labs, imaging studies, and discussing plan for follow up.  [place] Controlled Substance Database was reviewed by me for overdose risk score (ORS) if appropriate.  This patient was seen by Jonetta Osgood, FNP-C in collaboration with Dr. Clayborn Bigness as a part of collaborative care agreement.  Kendra Woolford R. Valetta Fuller, MSN, FNP-C Internal medicine

## 2021-08-22 ENCOUNTER — Telehealth: Payer: Self-pay

## 2021-08-22 NOTE — Telephone Encounter (Signed)
The patient is scheduled for a Iinq explant on 09/14/21.

## 2021-08-22 NOTE — Telephone Encounter (Signed)
Pt called that she feeling better but she stopped amoxicillin due to she was having diarrhea and she took OTC med to help diarrhea she is feeling better she said like to wait for another antibiotic advised to drink plenty of water and call us back if she not feeling better and relayed message to [employer]

## 2021-08-26 DIAGNOSIS — Z23 Encounter for immunization: Secondary | ICD-10-CM | POA: Diagnosis not present

## 2021-09-12 ENCOUNTER — Other Ambulatory Visit: Payer: Self-pay

## 2021-09-12 ENCOUNTER — Encounter: Payer: Self-pay | Admitting: Internal Medicine

## 2021-09-12 ENCOUNTER — Ambulatory Visit (INDEPENDENT_AMBULATORY_CARE_PROVIDER_SITE_OTHER): Payer: Medicare Other | Admitting: Internal Medicine

## 2021-09-12 VITALS — BP 132/60 | HR 61 | Ht 64.0 in | Wt 210.0 lb

## 2021-09-12 DIAGNOSIS — I639 Cerebral infarction, unspecified: Secondary | ICD-10-CM | POA: Diagnosis not present

## 2021-09-12 DIAGNOSIS — Z959 Presence of cardiac and vascular implant and graft, unspecified: Secondary | ICD-10-CM

## 2021-09-12 DIAGNOSIS — R0602 Shortness of breath: Secondary | ICD-10-CM

## 2021-09-12 DIAGNOSIS — I480 Paroxysmal atrial fibrillation: Secondary | ICD-10-CM | POA: Diagnosis not present

## 2021-09-12 NOTE — Progress Notes (Signed)
Patient Care Team: Lavera Guise, MD as PCP - General (Internal Medicine)   HPI  [name] Douglas is a 81 y.o. female Seen in followup for a loop recorder implanted for Cryptogenic Stroke 5/19  She is seen today because of an episode 9/22 of adenosine responsive SVT EMS record is not available, but reportedly "heart rates in the 170s.  Was given a 6 mg of adenosine with conversion to sinus rhythm.  "   Complaints of progressive dyspnea on exertion, now more limiting than her back.  Short of breath at 20 feet or so.  No nocturnal dyspnea or orthopnea.  No peripheral edema.  Denies concurrent chest pain.  Chest x-ray 9/22 demonstrates no significant pulmonary disease but some vascular congestion occurring in the context of her SVT admission; she has described in the notes as having COPD but I do not see that based on imaging.  Currently taking ASA and plavix.  No bleeding  DATE TEST EF   5/19 Echo   55-60 %   11/21 Echo   55-60 % LA normal   2/22 Echo  55-65%    Date Cr K Hgb TSH  12/21 0.85 3.8 10.6<,11.7    9/22 0.77 4.6 11.3 15.5     On an interrogation 3/21 >> afib and mssg was sent to discuss anticoagulation but apparently never happened and then neuro ( thankfully) saw that note and reached out for [place] to followup   Thromboembolic risk factors ( age  -2, HTN-1, TIA/CVA-2, Gender-1) for a CHADSVASc Score of >=6   Records and Results Reviewed   Past Medical History:  Diagnosis Date   Anxiety    Arthritis    "shoulders" (08/20/2014)   Asthma    Chronic lower back pain    Complication of anesthesia    "agitated & restless after knee replacement in 2008"   COPD (chronic obstructive pulmonary disease) ([place])    DDD (degenerative disc disease), lumbar    Depression    Diabetes mellitus without complication ([place])    Diverticulosis    Family history of adverse reaction to anesthesia    GERD (gastroesophageal reflux disease)    High cholesterol    Hypertension     Hypothyroidism    Memory loss    mild   On home oxygen therapy    "2L at night" (08/20/2014)   PAF (paroxysmal atrial fibrillation) ([place])    Peripheral vascular disease ([place])    Pre-diabetes    Shortness of breath dyspnea    with exertion   Stroke ([place])    mild, no deficits    Past Surgical History:  Procedure Laterality Date   CARDIAC CATHETERIZATION  ~ 2007   EXCISIONAL HEMORRHOIDECTOMY  1960's   EYE SURGERY Bilateral    Cataract extraction with IOL   KNEE ARTHROSCOPY Left X 2 <2008   KNEE ARTHROSCOPY WITH SUBCHONDROPLASTY Right 04/19/2016   Procedure: KNEE ARTHROSCOPY WITH SUBCHONDROPLASTY, PARTIAL MENISCECTOMY;  Surgeon: Hessie Knows, MD;  Location: [facility];  Service: Orthopedics;  Laterality: Right;   LOOP RECORDER INSERTION N/A 01/02/2018   Procedure: LOOP RECORDER INSERTION;  Surgeon: Deboraha Sprang, MD;  Location: [place] [facility];  Service: Cardiovascular;  Laterality: N/A;   LUMBAR LAMINECTOMY/DECOMPRESSION MICRODISCECTOMY N/A 02/06/2021   Procedure: Lumbar Three-Four, Lumbar Four-Five Laminectomy and Foraminotomy;  Surgeon: Kristeen Miss, MD;  Location: [place];  Service: Neurosurgery;  Laterality: N/A;   SHOULDER OPEN ROTATOR CUFF REPAIR Right 2001   TONSILLECTOMY  ~  [place] ARTHROPLASTY Left 2008   TOTAL KNEE ARTHROPLASTY Right 07/03/2016   Procedure: TOTAL KNEE ARTHROPLASTY;  Surgeon: Hessie Knows, MD;  Location: [facility];  Service: Orthopedics;  Laterality: Right;   TUBAL LIGATION  1973   VAGINAL HYSTERECTOMY  1974    Current Meds  Medication Sig   amLODipine (NORVASC) 5 MG tablet TAKE 1 TABLET BY MOUTH DAILY   apixaban (ELIQUIS) 5 MG TABS tablet Take 1 tablet (5 mg total) by mouth 2 (two) times daily.   atorvastatin (LIPITOR) 80 MG tablet TAKE 1 TABLET BY MOUTH DAILY AT 6 PM   benzonatate (TESSALON) 100 MG capsule Take 1 capsule (100 mg total) by mouth 2 (two) times daily as needed for cough.   cyclobenzaprine (FLEXERIL) 5 MG tablet Take 1 tablet (5  mg total) by mouth at bedtime.   donepezil (ARICEPT) 5 MG tablet Take 1 tablet (5 mg total) by mouth at bedtime.   flecainide (TAMBOCOR) 50 MG tablet Take 1 tablet (50 mg total) by mouth 2 (two) times daily.   gabapentin (NEURONTIN) 100 MG capsule Take 1 capsule (100 mg total) by mouth 3 (three) times daily.   levothyroxine (SYNTHROID) 88 MCG tablet Take 1 tablet (88 mcg total) by mouth daily.   metoprolol succinate (TOPROL-XL) 50 MG 24 hr tablet Take 2 tablets (100 mg total) by mouth daily. Take with or immediately following a meal.   venlafaxine XR (EFFEXOR-XR) 75 MG 24 hr capsule TAKE 2 CAPSULES BY MOUTH DAILY   Vitamin D, Ergocalciferol, (DRISDOL) 1.25 MG (50000 UNIT) CAPS capsule TAKE 1 CAPSULE BY MOUTH EVERY FRIDAY.    Allergies  Allergen Reactions   Heparin Nausea And Vomiting      Review of Systems negative except from HPI and PMH  Physical Exam BP 132/60 (BP Location: Left Arm, Patient Position: Sitting, Cuff Size: Large)    Pulse 61    Ht 5\' 4"  (1.626 m)    Wt 210 lb (95.3 kg)    SpO2 98%    BMI 36.05 kg/m  Well developed and nourished in no acute distress HENT normal Neck supple with JVP-  flat   Clear Loop recorder in place  Regular rate and rhythm, no murmurs or gallops Abd-soft with active BS No Clubbing cyanosis edema Skin-warm and dry A & Oriented  Grossly normal sensory and motor function  ECG   sinus at 61 Intervals 15/08/45    CrCl cannot be calculated (Patient's most recent lab result is older than the maximum 21 days allowed.).   Assessment and  Plan  Atrial fib paroxysmal  LINQ implantable loop  Hypertension  Stroke prior  Dyspnea on exertion probably HFpEF  Anemia  Significant dyspnea on exertion.  Progressive.  we will ambulate her today in the office measuring her pulse ox and resting O2 sat is normal.  We will repeat her echo since its been a year, and undertake Myoview scanning.  Measure BNP.  Blood pressure is reasonably controlled.   We will continue her on amlodipine 5 and metoprolol 100.  No interval atrial fibrillation of which she is aware or detected, continue flecainide 50 twice daily.  No bleeding we will continue the Eliquis 5 twice daily.  (Creatinine less than 1.5 and weight greater than 65)  [name] Douglas [id number]  [id number]  Preop Dx: Implantable loop recorder at end of service Postop Dx same/  Procedure: Loop recorder removal  Local anesthesia at the proximal end of the recorder.  A small  incision and device was removed.  A benzoin Steri-Strip dressing was applied     Virl Axe, MD 09/12/2021 10:41 AM

## 2021-09-12 NOTE — Patient Instructions (Addendum)
Medication Instructions:  - Your physician recommends that you continue on your current medications as directed. Please refer to the Current Medication list given to you today.  *If you need a refill on your cardiac medications before your next appointment, please call your pharmacy*   Lab Work: - none ordered  If you have labs (blood work) drawn today and your tests are completely normal, you will receive your results only by: [place] (if you have MyChart) OR A paper copy in the mail If you have any lab test that is abnormal or we need to change your treatment, we will call you to review the results.   Testing/Procedures:  1) Echocardiogram: -Your physician has requested that you have an echocardiogram. Echocardiography is a painless test that uses sound waves to create images of your heart. It provides your doctor with information about the size and shape of your heart and how well your hearts chambers and valves are working. This procedure takes approximately one hour. There are no restrictions for this procedure. There is a possibility that an IV may need to be started during your test to inject an image enhancing agent. This is done to obtain more optimal pictures of your heart. Therefore we ask that you do at least drink some water prior to coming in to hydrate your veins.    2) Lexiscan Myoview (chemical stress test): -Your physician has requested that you have a lexiscan myoview.   [place]  Your caregiver has ordered a Stress Test with nuclear imaging. The purpose of this test is to evaluate the blood supply to your heart muscle. This procedure is referred to as a "Non-Invasive Stress Test." This is because other than having an IV started in your vein, nothing is inserted or "invades" your body. Cardiac stress tests are done to find areas of poor blood flow to the heart by determining the extent of coronary artery disease (CAD). Some patients exercise on a treadmill,  which naturally increases the blood flow to your heart, while others who are  unable to walk on a treadmill due to physical limitations have a pharmacologic/chemical stress agent called Lexiscan . This medicine will mimic walking on a treadmill by temporarily increasing your coronary blood flow.   Please note: these test may take anywhere between 2-4 hours to complete  PLEASE REPORT TO [place] AT THE FIRST DESK WILL DIRECT YOU WHERE TO GO  Date of Procedure:_____________________________________  Arrival Time for Procedure:______________________________  Instructions regarding medication:   __x__ : You may take all of your regular medications the morning of your test with enough water to get them down safely  PLEASE NOTIFY THE OFFICE AT LEAST 24 HOURS IN ADVANCE IF YOU ARE UNABLE TO [place].  [phone] AND  PLEASE NOTIFY NUCLEAR MEDICINE AT [facility] AT LEAST 24 HOURS IN ADVANCE IF YOU ARE UNABLE TO KEEP YOUR APPOINTMENT. [phone]  How to prepare for your Myoview test:  Do not eat or drink after midnight No caffeine for 24 hours prior to test No smoking 24 hours prior to test. Your medication may be taken with water.  If your doctor stopped a medication because of this test, do not take that medication. Ladies, please do not wear dresses.  Skirts or pants are appropriate. Please wear a short sleeve shirt. No perfume, cologne or lotion. Wear comfortable walking shoes. No heels!    Follow-Up: At [facility], you and your health needs are our priority.  As part of our continuing mission to provide you with exceptional heart care, we have created designated Provider Care Teams.  These Care Teams include your primary Cardiologist (physician) and Advanced Practice Providers (APPs -  Physician Assistants and Nurse Practitioners) who all work together to provide you with the care you need, when you need it.  We recommend signing up for  the patient portal called "MyChart".  Sign up information is provided on this After Visit Summary.  MyChart is used to connect with patients for Virtual Visits (Telemedicine).  Patients are able to view lab/test results, encounter notes, upcoming appointments, etc.  Non-urgent messages can be sent to your provider as well.   To learn more about what you can do with MyChart, go to [email or web address].    Your next appointment:   6 month(s)  The format for your next appointment:   In Person  Provider:   Virl Axe, MD    Other Instructions - Post Loop Recorder Explant instructions:  1) You may shower tomorrow night 2) You may remove your tegaderm (top) dressing on Day 4 post procedure: Saturday (1/28) 3) You may remove your steri strips on Day 6 (Monday 1/30) post procedure if they have not fallen off on their own. 4) If you have any bleeding issues or concerns with your  site after getting home, please call our [facility] directly at [phone].   Marland Kitchen

## 2021-09-12 NOTE — Progress Notes (Deleted)
Patient Care Team: Lavera Guise, MD as PCP - General (Internal Medicine)   HPI  [name] Douglas is a 81 y.o. female Seen in followup for a loop recorder implanted for Cryptogenic Stroke 5/19  She is seen today because of an episode 9/22 of adenosine responsive SVT EMS record is not available, but reportedly "heart rates in the 170s.  Was given a 6 mg of adenosine with conversion to sinus rhythm.  "  Lightheaded and short of breath.  In general, back pain is more limiting but does have some mild shortness of breath but no chest pain.  No edema.      On an interrogation 3/21 >> afib and mssg was sent to discuss anticoagulation but apparently never happened and then neuro ( thankfully) saw that note and reached out for [place] to followup   The patient denies chest pain***, shortness of breath***, nocturnal dyspnea***, orthopnea*** or peripheral edema***.  There have been no palpitations***, lightheadedness*** or syncope***.  Complains of ***.    DATE TEST EF   5/19 Echo   55-60 %   11/21 Echo   55-60 % LA normal         Date Cr K Hgb TSH  12/21 0.85 3.8 10.6<,11.7    9/22 0.77 4.6 11.3 95.0     Thromboembolic risk factors ( age  -2, HTN-1, TIA/CVA-2, Gender-1) for a CHADSVASc Score of >=6   Records and Results Reviewed   Past Medical History:  Diagnosis Date   Anxiety    Arthritis    "shoulders" (08/20/2014)   Asthma    Chronic lower back pain    Complication of anesthesia    "agitated & restless after knee replacement in 2008"   COPD (chronic obstructive pulmonary disease) ([place])    DDD (degenerative disc disease), lumbar    Depression    Diabetes mellitus without complication ([place])    Diverticulosis    Family history of adverse reaction to anesthesia    GERD (gastroesophageal reflux disease)    High cholesterol    Hypertension    Hypothyroidism    Memory loss    mild   On home oxygen therapy    "2L at night" (08/20/2014)   PAF (paroxysmal atrial fibrillation)  (HCC)    Peripheral vascular disease (HCC)    Pre-diabetes    Shortness of breath dyspnea    with exertion   Stroke ([place])    mild, no deficits    Past Surgical History:  Procedure Laterality Date   CARDIAC CATHETERIZATION  ~ 2007   EXCISIONAL HEMORRHOIDECTOMY  1960's   EYE SURGERY Bilateral    Cataract extraction with IOL   KNEE ARTHROSCOPY Left X 2 <2008   KNEE ARTHROSCOPY WITH SUBCHONDROPLASTY Right 04/19/2016   Procedure: KNEE ARTHROSCOPY WITH SUBCHONDROPLASTY, PARTIAL MENISCECTOMY;  Surgeon: Hessie Knows, MD;  Location: [facility];  Service: Orthopedics;  Laterality: Right;   LOOP RECORDER INSERTION N/A 01/02/2018   Procedure: LOOP RECORDER INSERTION;  Surgeon: Deboraha Sprang, MD;  Location: [place] [facility];  Service: Cardiovascular;  Laterality: N/A;   LUMBAR LAMINECTOMY/DECOMPRESSION MICRODISCECTOMY N/A 02/06/2021   Procedure: Lumbar Three-Four, Lumbar Four-Five Laminectomy and Foraminotomy;  Surgeon: Kristeen Miss, MD;  Location: [place];  Service: Neurosurgery;  Laterality: N/A;   SHOULDER OPEN ROTATOR CUFF REPAIR Right 2001   TONSILLECTOMY  ~ [place] ARTHROPLASTY Left 2008   TOTAL KNEE ARTHROPLASTY Right 07/03/2016   Procedure: TOTAL KNEE ARTHROPLASTY;  Surgeon:  Hessie Knows, MD;  Location: [facility];  Service: Orthopedics;  Laterality: Right;   TUBAL LIGATION  1973   VAGINAL HYSTERECTOMY  1974    Current Meds  Medication Sig   amLODipine (NORVASC) 5 MG tablet TAKE 1 TABLET BY MOUTH DAILY   apixaban (ELIQUIS) 5 MG TABS tablet Take 1 tablet (5 mg total) by mouth 2 (two) times daily.   atorvastatin (LIPITOR) 80 MG tablet TAKE 1 TABLET BY MOUTH DAILY AT 6 PM   benzonatate (TESSALON) 100 MG capsule Take 1 capsule (100 mg total) by mouth 2 (two) times daily as needed for cough.   cyclobenzaprine (FLEXERIL) 5 MG tablet Take 1 tablet (5 mg total) by mouth at bedtime.   donepezil (ARICEPT) 5 MG tablet Take 1 tablet (5 mg total) by mouth at bedtime.   flecainide  (TAMBOCOR) 50 MG tablet Take 1 tablet (50 mg total) by mouth 2 (two) times daily.   gabapentin (NEURONTIN) 100 MG capsule Take 1 capsule (100 mg total) by mouth 3 (three) times daily.   levothyroxine (SYNTHROID) 88 MCG tablet Take 1 tablet (88 mcg total) by mouth daily.   metoprolol succinate (TOPROL-XL) 50 MG 24 hr tablet Take 2 tablets (100 mg total) by mouth daily. Take with or immediately following a meal.   venlafaxine XR (EFFEXOR-XR) 75 MG 24 hr capsule TAKE 2 CAPSULES BY MOUTH DAILY   Vitamin D, Ergocalciferol, (DRISDOL) 1.25 MG (50000 UNIT) CAPS capsule TAKE 1 CAPSULE BY MOUTH EVERY FRIDAY.    Allergies  Allergen Reactions   Heparin Nausea And Vomiting      Review of Systems negative except from HPI and PMH  Physical Exam BP 132/60 (BP Location: Left Arm, Patient Position: Sitting, Cuff Size: Large)    Pulse 61    Ht 5\' 4"  (1.626 m)    Wt 210 lb (95.3 kg)    SpO2 98%    BMI 36.05 kg/m  Well developed and well nourished in no acute distress HENT normal Neck supple with JVP-flat Clear Device pocket well healed; without hematoma or erythema.  There is no tethering  Regular rate and rhythm, no  gallop No / murmur Abd-soft with active BS No Clubbing cyanosis  edema Skin-warm and dry A & Oriented  Grossly normal sensory and motor function  ECG sinus at 70 Intervals 14/08/41 Otherwise normal    CrCl cannot be calculated (Patient's most recent lab result is older than the maximum 21 days allowed.).   Assessment and  Plan  Atrial fib paroxysmal  LINQ implantable loop  Hypertension9  Stroke prior  HFpEF  Anemia  Blood pressure well controlled.  We will continue her on amlodipine 5 Toprol 100.  We will discontinue her losartan   Recurrent SVT.  Device interrogation demonstrated episodes September 21 x2, November 21, February 22, March 22, and September 22.  The device was reprogrammed because I think there was oscillating around the detection rate with decreased  detection of 154 bpm.  Review of the electrogram, I had to take a picture and enlarging, is most consistent with atrial tachycardia with a morphologic change in the T wave preceding the tachycardia suggesting the description of a P wave without PR prolongation.  Hence, we have elected to initiate flecainide 50 mg twice daily as a therapeutic strategy.  She is disinclined towards ablation as am I in this 81 year old lady with an atrial tachycardia.  On Eliquis for her atrial fibrillation.  No bleeding.  Continue 5 mg twice daily  TSH was elevated with her PCP as she has increased the dose of her Synthroid`       Current medicines are reviewed at length with the patient today .  The patient does not  have concerns regarding medicines.

## 2021-09-13 ENCOUNTER — Ambulatory Visit (INDEPENDENT_AMBULATORY_CARE_PROVIDER_SITE_OTHER): Payer: Medicare Other

## 2021-09-13 DIAGNOSIS — R0602 Shortness of breath: Secondary | ICD-10-CM

## 2021-09-13 MED ORDER — PERFLUTREN LIPID MICROSPHERE
1.0000 mL | INTRAVENOUS | Status: AC | PRN
  Administered 2021-09-13: 2 mL via INTRAVENOUS

## 2021-09-14 ENCOUNTER — Telehealth: Payer: Self-pay | Admitting: Internal Medicine

## 2021-09-14 ENCOUNTER — Encounter: Payer: Medicare Other | Admitting: Internal Medicine

## 2021-09-14 LAB — ECHOCARDIOGRAM COMPLETE
AR max vel: 1.57 cm2
AV Area VTI: 1.33 cm2
AV Area mean vel: 1.57 cm2
AV Mean grad: 10.5 mmHg
AV Peak grad: 17.6 mmHg
Ao pk vel: 2.1 m/s
Area-P 1/2: 3.91 cm2
P 1/2 time: 478 msec
S' Lateral: 2.6 cm
Single Plane A4C EF: 48.7 %

## 2021-09-14 NOTE — Telephone Encounter (Signed)
Attempted to call the patient with her results. No answer- I left a detailed message of results (ok per DPR) and advised we will follow back up with her after once we have the results of her stress test that is scheduled for 09/18/21.   I asked that she call back with any further questions/ concerns

## 2021-09-14 NOTE — Telephone Encounter (Signed)
[name] Sprang, MD  09/14/2021  9:23 AM EST     Please Inform Patient Echo showed  normal heart muscle function    This isn good news and we will wait if the myoview scan is helpful in giving insight

## 2021-09-18 ENCOUNTER — Other Ambulatory Visit: Payer: Self-pay

## 2021-09-18 ENCOUNTER — Encounter
Admission: RE | Admit: 2021-09-18 | Discharge: 2021-09-18 | Disposition: A | Discharge status: Home / Self Care | Payer: Medicare Other | Source: Ambulatory Visit | Attending: Internal Medicine | Admitting: Internal Medicine

## 2021-09-18 DIAGNOSIS — R0602 Shortness of breath: Secondary | ICD-10-CM | POA: Insufficient documentation

## 2021-09-18 LAB — NM MYOCAR MULTI W/SPECT W/WALL MOTION / EF
LV dias vol: 88 mL (ref 46–106)
LV sys vol: 35 mL
Nuc Stress EF: 60 %
Peak HR: 68 {beats}/min
Percent HR: 48 %
Rest HR: 62 {beats}/min
Rest Nuclear Isotope Dose: 10.4 mCi
SDS: 2
SRS: 5
SSS: 7
ST Depression (mm): 0 mm
Stress Nuclear Isotope Dose: 29.1 mCi
TID: 1.02

## 2021-09-18 MED ORDER — TECHNETIUM TC 99M TETROFOSMIN IV KIT
10.0000 | PACK | Freq: Once | INTRAVENOUS | Status: AC | PRN
  Administered 2021-09-18: 10.44 via INTRAVENOUS

## 2021-09-18 MED ORDER — REGADENOSON 0.4 MG/5ML IV SOLN
0.4000 mg | Freq: Once | INTRAVENOUS | Status: AC
  Administered 2021-09-18: 0.4 mg via INTRAVENOUS

## 2021-09-18 MED ORDER — TECHNETIUM TC 99M TETROFOSMIN IV KIT
29.0700 | PACK | Freq: Once | INTRAVENOUS | Status: AC | PRN
  Administered 2021-09-18: 29.07 via INTRAVENOUS

## 2021-09-25 ENCOUNTER — Telehealth: Payer: Self-pay | Admitting: Internal Medicine

## 2021-09-25 NOTE — Telephone Encounter (Signed)
Patient daughter calling to discuss recent stress testing results    She is aware results are preliminary and when POC with details on result from [facility] available nurse will call to discuss.   Patient declined to set up a vm due to tech issues and daughter is asking for a call back to her instead .

## 2021-09-26 NOTE — Telephone Encounter (Signed)
I spoke with the patient's daughter, Larene Beach (ok per DPR). I have advised her of the patient's preliminary report for her stress test results:   The study is normal. The study is low risk  I have advised that Dr. Caryl Comes will still sign off on the final report, but I do not forsee any further cardiac testing being warranted at this time.  Per Larene Beach, the patient is still having SOB with walking up steps. I have advised her that she may need to follow up with her PCP/ Pulmonary physician for further evaluation of her breathing.  Larene Beach is aware we will still touch base with them once Dr. Caryl Comes signs the final report for the patient's stress test.  Larene Beach advised it is ok to leave a detailed message on her cell # of Dr. Olin Pia recommendations.  She voices understanding of the above and was appreciative of the call today.

## 2021-09-26 NOTE — Telephone Encounter (Signed)
Attempted to contact the patient's daughter Larene Beach (ok per DPR). No answer- I left a message to please call back.

## 2021-09-26 NOTE — Telephone Encounter (Signed)
Patient daughter returning call  Please call her at work at [phone]

## 2021-10-03 ENCOUNTER — Other Ambulatory Visit: Payer: Self-pay | Admitting: Internal Medicine

## 2021-10-03 ENCOUNTER — Telehealth: Payer: Self-pay | Admitting: Internal Medicine

## 2021-10-03 NOTE — Telephone Encounter (Signed)
Attempted to call the patient's daughter, Larene Beach, with final results. See 09/25/21 phone note.   No answer- I left a detailed message of results and advised if the patient is still having ongoing issues with her SOB she should follow up with her PCP/ Pulmonary MD.   I asked that she call back with any further questions/ concerns.

## 2021-10-03 NOTE — Telephone Encounter (Signed)
[name] Sprang, MD  10/02/2021  9:52 PM EST     Please Inform Patient that stress test showed no evidence of ischemia, normal  heart muscle function     Thanks

## 2021-10-04 NOTE — Telephone Encounter (Signed)
Prescription refill request for Eliquis received. Indication: Afib  Last office visit: 09/12/21 Caryl Comes)  Scr: 0.89 (06/26/21)  Age: 81 Weight: 95.3kg  Appropriate dose and refill sent to requested pharmacy.

## 2021-10-16 ENCOUNTER — Other Ambulatory Visit: Payer: Self-pay | Admitting: Physician Assistant

## 2021-10-16 DIAGNOSIS — M62838 Other muscle spasm: Secondary | ICD-10-CM

## 2021-10-22 ENCOUNTER — Telehealth: Payer: Self-pay

## 2021-10-22 DIAGNOSIS — M62838 Other muscle spasm: Secondary | ICD-10-CM

## 2021-10-22 MED ORDER — CYCLOBENZAPRINE HCL 5 MG PO TABS: 5.0000 mg | ORAL_TABLET | Freq: Every day | ORAL | 3 refills | Status: DC

## 2021-10-22 NOTE — Telephone Encounter (Signed)
PA for CYCLOBENZAPRINE was approved valid from 08/20/21 to 01/20/22, new rx sent to pharmacy ?

## 2021-10-22 NOTE — Telephone Encounter (Signed)
PA for CYCLOBENZAPRINE was sent 10/22/21 @ 645 pm ?

## 2021-11-09 ENCOUNTER — Other Ambulatory Visit: Payer: Self-pay

## 2021-11-09 ENCOUNTER — Ambulatory Visit (INDEPENDENT_AMBULATORY_CARE_PROVIDER_SITE_OTHER): Payer: Medicare Other | Admitting: Physician Assistant

## 2021-11-09 ENCOUNTER — Encounter: Payer: Self-pay | Admitting: Physician Assistant

## 2021-11-09 VITALS — BP 131/52 | HR 66 | Temp 98.4°F | Resp 16 | Ht 64.0 in | Wt 214.0 lb

## 2021-11-09 DIAGNOSIS — E119 Type 2 diabetes mellitus without complications: Secondary | ICD-10-CM | POA: Diagnosis not present

## 2021-11-09 DIAGNOSIS — I10 Essential (primary) hypertension: Secondary | ICD-10-CM

## 2021-11-09 DIAGNOSIS — E039 Hypothyroidism, unspecified: Secondary | ICD-10-CM

## 2021-11-09 DIAGNOSIS — I480 Paroxysmal atrial fibrillation: Secondary | ICD-10-CM

## 2021-11-09 LAB — POCT GLYCOSYLATED HEMOGLOBIN (HGB A1C): Hemoglobin A1C: 6 % — AB (ref 4.0–5.6)

## 2021-11-09 NOTE — Progress Notes (Signed)
[facility] ?[address] ?[place], [place] [address] ? ?Internal MEDICINE  ?Office Visit Note ? ?Patient Name: [name] Douglas ? [id number]  ?[id number] ? ?Date of Service: 11/09/2021 ? ?Chief Complaint  ?Patient presents with  ? Follow-up  ? Diabetes  ? Depression  ? Hyperlipidemia  ? Hypertension  ? Gastroesophageal Reflux  ? ? ?HPI ?Pt is here for routine follow up and has no complaints ?-Went to cardiology and they took out her long term monitor/loop recorder. They also did a stress test which was normal and was told no further testing needed right now. Denies any SOB or palpitations and has been doing well without any recurrences since last hospitalization in Sept 2022 ?-BG not checked at home ?-BP well controlled at home ?-tolerating medications well ?-reports she will need her DMV handicap placard renewed once she has the paper for it and will bring it by to have signed ? ?Current Medication: ?Outpatient Encounter Medications as of 11/09/2021  ?Medication Sig  ? amLODipine (NORVASC) 5 MG tablet TAKE 1 TABLET BY MOUTH DAILY  ? atorvastatin (LIPITOR) 80 MG tablet TAKE 1 TABLET BY MOUTH DAILY AT 6 PM  ? benzonatate (TESSALON) 100 MG capsule Take 1 capsule (100 mg total) by mouth 2 (two) times daily as needed for cough.  ? cyclobenzaprine (FLEXERIL) 5 MG tablet Take 1 tablet (5 mg total) by mouth at bedtime.  ? donepezil (ARICEPT) 5 MG tablet Take 1 tablet (5 mg total) by mouth at bedtime.  ? ELIQUIS 5 MG TABS tablet TAKE 1 TABLET BY MOUTH TWICE A DAY  ? flecainide (TAMBOCOR) 50 MG tablet Take 1 tablet (50 mg total) by mouth 2 (two) times daily.  ? gabapentin (NEURONTIN) 100 MG capsule Take 1 capsule (100 mg total) by mouth 3 (three) times daily.  ? levothyroxine (SYNTHROID) 88 MCG tablet Take 1 tablet (88 mcg total) by mouth daily.  ? metoprolol succinate (TOPROL-XL) 50 MG 24 hr tablet Take 2 tablets (100 mg total) by mouth daily. Take with or immediately following a meal.  ? venlafaxine XR (EFFEXOR-XR)  75 MG 24 hr capsule TAKE 2 CAPSULES BY MOUTH DAILY  ? Vitamin D, Ergocalciferol, (DRISDOL) 1.25 MG (50000 UNIT) CAPS capsule TAKE 1 CAPSULE BY MOUTH EVERY FRIDAY.  ? ?No facility-administered encounter medications on file as of 11/09/2021.  ? ? ?Surgical History: ?Past Surgical History:  ?Procedure Laterality Date  ? CARDIAC CATHETERIZATION  ~ 2007  ? EXCISIONAL HEMORRHOIDECTOMY  1960's  ? EYE SURGERY Bilateral   ? Cataract extraction with IOL  ? KNEE ARTHROSCOPY Left X 2 <2008  ? KNEE ARTHROSCOPY WITH SUBCHONDROPLASTY Right 04/19/2016  ? Procedure: KNEE ARTHROSCOPY WITH SUBCHONDROPLASTY, PARTIAL MENISCECTOMY;  Surgeon: Hessie Knows, MD;  Location: [facility];  Service: Orthopedics;  Laterality: Right;  ? LOOP RECORDER INSERTION N/A 01/02/2018  ? Procedure: LOOP RECORDER INSERTION;  Surgeon: Deboraha Sprang, MD;  Location: [place] [facility];  Service: Cardiovascular;  Laterality: N/A;  ? LUMBAR LAMINECTOMY/DECOMPRESSION MICRODISCECTOMY N/A 02/06/2021  ? Procedure: Lumbar Three-Four, Lumbar Four-Five Laminectomy and Foraminotomy;  Surgeon: Kristeen Miss, MD;  Location: [place];  Service: Neurosurgery;  Laterality: N/A;  ? SHOULDER OPEN ROTATOR CUFF REPAIR Right 2001  ? TONSILLECTOMY  ~ 1950  ? TOTAL KNEE ARTHROPLASTY Left 2008  ? TOTAL KNEE ARTHROPLASTY Right 07/03/2016  ? Procedure: TOTAL KNEE ARTHROPLASTY;  Surgeon: Hessie Knows, MD;  Location: [facility];  Service: Orthopedics;  Laterality: Right;  ? TUBAL LIGATION  1973  ? VAGINAL HYSTERECTOMY  1974  ? ? ?Medical History: ?Past Medical History:  ?Diagnosis Date  ? Anxiety   ? Arthritis   ? "shoulders" (08/20/2014)  ? Asthma   ? Chronic lower back pain   ? Complication of anesthesia   ? "agitated & restless after knee replacement in 2008"  ? COPD (chronic obstructive pulmonary disease) ([place])   ? DDD (degenerative disc disease), lumbar   ? Depression   ? Diabetes mellitus without complication ([place])   ? Diverticulosis   ? Family history of adverse reaction to anesthesia   ?  GERD (gastroesophageal reflux disease)   ? High cholesterol   ? Hypertension   ? Hypothyroidism   ? Memory loss   ? mild  ? On home oxygen therapy   ? "2L at night" (08/20/2014)  ? PAF (paroxysmal atrial fibrillation) ([place])   ? Peripheral vascular disease ([place])   ? Pre-diabetes   ? Shortness of breath dyspnea   ? with exertion  ? Stroke [facility])   ? mild, no deficits  ? ? ?Family History: ?Family History  ?Problem Relation Age of Onset  ? Diabetes Mother   ? Hypertension Mother   ? Lung cancer Mother   ? Congestive Heart Failure Father   ? Congestive Heart Failure Sister   ? ? ?Social History  ? ?Socioeconomic History  ? Marital status: Divorced  ?  Spouse name: Not on file  ? Number of children: Not on file  ? Years of education: Not on file  ? Highest education level: Not on file  ?Occupational History  ? Not on file  ?Tobacco Use  ? Smoking status: Every Day  ?  Packs/day: 0.25  ?  Years: 53.00  ?  Pack years: 13.25  ?  Types: Cigarettes  ? Smokeless tobacco: Never  ? Tobacco comments:  ?  7 cigarettes weekly  ?Vaping Use  ? Vaping Use: Never used  ?Substance and Sexual Activity  ? Alcohol use: No  ? Drug use: No  ? Sexual activity: Not Currently  ?Other Topics Concern  ? Not on file  ?Social History Narrative  ? Not on file  ? ?Social Determinants of Health  ? ?Financial Resource Strain: Not on file  ?Food Insecurity: Not on file  ?Transportation Needs: Not on file  ?Physical Activity: Not on file  ?Stress: Not on file  ?Social Connections: Not on file  ?Intimate Partner Violence: Not on file  ? ? ? ? ?Review of Systems  ?Constitutional:  Negative for chills and unexpected weight change.  ?HENT:  Negative for congestion, postnasal drip, rhinorrhea, sneezing and sore throat.   ?Eyes:  Negative for redness.  ?Respiratory:  Negative for cough, chest tightness and shortness of breath.   ?Cardiovascular:  Negative for chest pain and palpitations.  ?Gastrointestinal:  Negative for abdominal pain, constipation, diarrhea,  nausea and vomiting.  ?Genitourinary:  Negative for dysuria and frequency.  ?Musculoskeletal:  Positive for arthralgias. Negative for back pain, joint swelling and neck pain.  ?Skin:  Negative for rash.  ?Neurological: Negative.  Negative for tremors and numbness.  ?Hematological:  Negative for adenopathy. Does not bruise/bleed easily.  ?Psychiatric/Behavioral:  Negative for behavioral problems (Depression), sleep disturbance and suicidal ideas. The patient is not nervous/anxious.   ? ?Vital Signs: ?BP (!) 131/52   Pulse 66   Temp 98.4 ?F (36.9 ?C)   Resp 16   Ht '5\' 4"'$  (1.626 m)   Wt 214 lb (97.1 kg)   SpO2 94%   BMI 36.73  kg/m?  ? ? ?Physical Exam ?Vitals and nursing note reviewed.  ?Constitutional:   ?   General: She is not in acute distress. ?   Appearance: She is well-developed. She is obese. She is not diaphoretic.  ?HENT:  ?   Head: Normocephalic and atraumatic.  ?   Mouth/Throat:  ?   Pharynx: No oropharyngeal exudate.  ?Eyes:  ?   Pupils: Pupils are equal, round, and reactive to light.  ?Neck:  ?   Thyroid: No thyromegaly.  ?   Vascular: No JVD.  ?   Trachea: No tracheal deviation.  ?Cardiovascular:  ?   Rate and Rhythm: Normal rate and regular rhythm.  ?   Heart sounds: Normal heart sounds. No murmur heard. ?  No friction rub. No gallop.  ?Pulmonary:  ?   Effort: Pulmonary effort is normal. No respiratory distress.  ?   Breath sounds: No wheezing or rales.  ?Chest:  ?   Chest wall: No tenderness.  ?Abdominal:  ?   General: Bowel sounds are normal.  ?   Palpations: Abdomen is soft.  ?Musculoskeletal:     ?   General: Normal range of motion.  ?   Cervical back: Normal range of motion and neck supple.  ?Lymphadenopathy:  ?   Cervical: No cervical adenopathy.  ?Skin: ?   General: Skin is warm and dry.  ?Neurological:  ?   Mental Status: She is alert and oriented to person, place, and time.  ?   Cranial Nerves: No cranial nerve deficit.  ?Psychiatric:     ?   Behavior: Behavior normal.     ?   Thought  Content: Thought content normal.     ?   Judgment: Judgment normal.  ? ? ? ? ? ?Assessment/Plan: ?1. Diabetes mellitus without complication (HCC) ?- POCT HgB A1C is 6.0 which is stable from last check. Will cont

## 2021-11-22 ENCOUNTER — Other Ambulatory Visit: Payer: Self-pay | Admitting: Physician Assistant

## 2021-11-22 DIAGNOSIS — E559 Vitamin D deficiency, unspecified: Secondary | ICD-10-CM

## 2022-01-01 ENCOUNTER — Other Ambulatory Visit: Payer: Self-pay | Admitting: Internal Medicine

## 2022-01-01 DIAGNOSIS — F411 Generalized anxiety disorder: Secondary | ICD-10-CM

## 2022-01-11 ENCOUNTER — Encounter: Payer: Self-pay | Admitting: Internal Medicine

## 2022-02-06 ENCOUNTER — Other Ambulatory Visit: Payer: Self-pay | Admitting: Nurse Practitioner

## 2022-02-06 ENCOUNTER — Other Ambulatory Visit: Payer: Self-pay | Admitting: Internal Medicine

## 2022-02-06 DIAGNOSIS — I10 Essential (primary) hypertension: Secondary | ICD-10-CM

## 2022-02-06 NOTE — Telephone Encounter (Signed)
Could you please contact the patient to schedule a 6 month follow up?

## 2022-02-06 NOTE — Telephone Encounter (Signed)
LMOV  

## 2022-02-08 NOTE — Telephone Encounter (Signed)
Lmov to schedule  

## 2022-02-09 ENCOUNTER — Telehealth: Payer: Self-pay | Admitting: Internal Medicine

## 2022-02-09 MED ORDER — FLECAINIDE ACETATE 50 MG PO TABS: 50.0000 mg | ORAL_TABLET | Freq: Two times a day (BID) | ORAL | 0 refills | Status: DC

## 2022-02-22 ENCOUNTER — Other Ambulatory Visit: Payer: Self-pay | Admitting: Physician Assistant

## 2022-02-22 DIAGNOSIS — I10 Essential (primary) hypertension: Secondary | ICD-10-CM

## 2022-03-02 DIAGNOSIS — Z9889 Other specified postprocedural states: Secondary | ICD-10-CM | POA: Insufficient documentation

## 2022-03-05 ENCOUNTER — Encounter: Payer: Self-pay | Admitting: Internal Medicine

## 2022-03-05 ENCOUNTER — Ambulatory Visit (INDEPENDENT_AMBULATORY_CARE_PROVIDER_SITE_OTHER): Payer: Medicare Other | Admitting: Internal Medicine

## 2022-03-05 VITALS — BP 112/60 | HR 68 | Ht 64.0 in | Wt 210.6 lb

## 2022-03-05 DIAGNOSIS — Z79899 Other long term (current) drug therapy: Secondary | ICD-10-CM

## 2022-03-05 DIAGNOSIS — I10 Essential (primary) hypertension: Secondary | ICD-10-CM

## 2022-03-05 DIAGNOSIS — I639 Cerebral infarction, unspecified: Secondary | ICD-10-CM

## 2022-03-05 DIAGNOSIS — Z9889 Other specified postprocedural states: Secondary | ICD-10-CM | POA: Diagnosis not present

## 2022-03-05 DIAGNOSIS — I480 Paroxysmal atrial fibrillation: Secondary | ICD-10-CM

## 2022-03-05 DIAGNOSIS — R0600 Dyspnea, unspecified: Secondary | ICD-10-CM

## 2022-03-05 MED ORDER — APIXABAN 5 MG PO TABS: 5.0000 mg | ORAL_TABLET | Freq: Two times a day (BID) | ORAL | 3 refills | Status: DC

## 2022-03-05 MED ORDER — FLECAINIDE ACETATE 50 MG PO TABS: 50.0000 mg | ORAL_TABLET | Freq: Two times a day (BID) | ORAL | 3 refills | Status: DC

## 2022-03-05 MED ORDER — METOPROLOL SUCCINATE ER 50 MG PO TB24: ORAL_TABLET | ORAL | 1 refills | Status: DC

## 2022-03-05 MED ORDER — FUROSEMIDE 20 MG PO TABS: 20.0000 mg | ORAL_TABLET | ORAL | 3 refills | Status: DC

## 2022-03-05 NOTE — Patient Instructions (Signed)
Medication Instructions:  Your physician has recommended you make the following change in your medication:   ** Decrease Metoprolol '50mg'$  - 1 tablet by mouth every morning  ** Begin Furosemide '20mg'$  - 1 tablet by mouth daily x 3 days then 1 tablet by mouth every other day.  *If you need a refill on your cardiac medications before your next appointment, please call your pharmacy*   Lab Work: CBC, BNPand BMET If you have labs (blood work) drawn today and your tests are completely normal, you will receive your results only by: [place] (if you have MyChart) OR A paper copy in the mail If you have any lab test that is abnormal or we need to change your treatment, we will call you to review the results.   Testing/Procedures: None ordered.    Follow-Up: At [facility], you and your health needs are our priority.  As part of our continuing mission to provide you with exceptional heart care, we have created designated Provider Care Teams.  These Care Teams include your primary Cardiologist (physician) and Advanced Practice Providers (APPs -  Physician Assistants and Nurse Practitioners) who all work together to provide you with the care you need, when you need it.  We recommend signing up for the patient portal called "MyChart".  Sign up information is provided on this After Visit Summary.  MyChart is used to connect with patients for Virtual Visits (Telemedicine).  Patients are able to view lab/test results, encounter notes, upcoming appointments, etc.  Non-urgent messages can be sent to your provider as well.   To learn more about what you can do with MyChart, go to [email or web address].    Your next appointment:   6 months with Dr Caryl Comes  Important Information About Sugar

## 2022-03-05 NOTE — Progress Notes (Signed)
Patient Care Team: Carolynne Edouard as PCP - General (Physician Assistant)   HPI  [name] Douglas is a 81 y.o. female Seen in followup for a loop recorder implanted for Cryptogenic Stroke 5/19 explanted 1/23  Also with adenosine responsive SVT EMS record is not available, but reportedly "heart rates in the 170s.  Was given a 6 mg of adenosine with conversion to sinus rhythm.  "   3/21 A-fib was detected on her monitor.  Antiplatelet therapy was subsequently discontinued (1/22) and anticoagulation with Eliquis was initiated.  No clinical bleeding.   Biggest complaint is dyspnea on exertion.  This is less than about 30 or 40 feet.  Evaluation in the fall included negative echo negative Myoview chest x-ray suggesting central vascular congestion   No orthopnea, no nocturnal dyspnea or peripheral edema        DATE TEST EF   5/19 Echo   55-60 %   11/21 Echo   55-60 % LA normal   2/22 Echo  55-65%   1.23 Myoview  No ischemia  1/23 Echo  55-65%    Date Cr K Hgb TSH  12/21 0.85 3.8 10.6<,11.7    9/22 0.77 4.6 11.3 15.5  11/22    1.19     Thromboembolic risk factors ( age  -2, HTN-1, TIA/CVA-2, Gender-1) for a CHADSVASc Score of >=6   Records and Results Reviewed   Past Medical History:  Diagnosis Date   Anxiety    Arthritis    "shoulders" (08/20/2014)   Asthma    Chronic lower back pain    Complication of anesthesia    "agitated & restless after knee replacement in 2008"   COPD (chronic obstructive pulmonary disease) (HCC)    DDD (degenerative disc disease), lumbar    Depression    Diabetes mellitus without complication ([place])    Diverticulosis    Family history of adverse reaction to anesthesia    GERD (gastroesophageal reflux disease)    High cholesterol    Hypertension    Hypothyroidism    Memory loss    mild   On home oxygen therapy    "2L at night" (08/20/2014)   PAF (paroxysmal atrial fibrillation) (HCC)    Peripheral vascular disease (HCC)     Pre-diabetes    Shortness of breath dyspnea    with exertion   Stroke ([place])    mild, no deficits    Past Surgical History:  Procedure Laterality Date   CARDIAC CATHETERIZATION  ~ 2007   EXCISIONAL HEMORRHOIDECTOMY  1960's   EYE SURGERY Bilateral    Cataract extraction with IOL   KNEE ARTHROSCOPY Left X 2 <2008   KNEE ARTHROSCOPY WITH SUBCHONDROPLASTY Right 04/19/2016   Procedure: KNEE ARTHROSCOPY WITH SUBCHONDROPLASTY, PARTIAL MENISCECTOMY;  Surgeon: Hessie Knows, MD;  Location: [facility];  Service: Orthopedics;  Laterality: Right;   LOOP RECORDER INSERTION N/A 01/02/2018   Procedure: LOOP RECORDER INSERTION;  Surgeon: Deboraha Sprang, MD;  Location: [facility];  Service: Cardiovascular;  Laterality: N/A;   LUMBAR LAMINECTOMY/DECOMPRESSION MICRODISCECTOMY N/A 02/06/2021   Procedure: Lumbar Three-Four, Lumbar Four-Five Laminectomy and Foraminotomy;  Surgeon: Kristeen Miss, MD;  Location: [facility];  Service: Neurosurgery;  Laterality: N/A;   SHOULDER OPEN ROTATOR CUFF REPAIR Right 2001   TONSILLECTOMY  ~ [place] ARTHROPLASTY Left 2008   TOTAL KNEE ARTHROPLASTY Right 07/03/2016   Procedure: TOTAL KNEE ARTHROPLASTY;  Surgeon: Hessie Knows, MD;  Location: [facility];  Service: Orthopedics;  Laterality: Right;   TUBAL LIGATION  1973   VAGINAL HYSTERECTOMY  1974    Current Meds  Medication Sig   amLODipine (NORVASC) 5 MG tablet TAKE 1 TABLET BY MOUTH DAILY   atorvastatin (LIPITOR) 80 MG tablet TAKE 1 TABLET BY MOUTH DAILY AT 6 PM   cyclobenzaprine (FLEXERIL) 5 MG tablet Take 1 tablet (5 mg total) by mouth at bedtime.   donepezil (ARICEPT) 5 MG tablet Take 1 tablet (5 mg total) by mouth at bedtime.   ELIQUIS 5 MG TABS tablet TAKE 1 TABLET BY MOUTH TWICE A DAY   flecainide (TAMBOCOR) 50 MG tablet Take 1 tablet (50 mg total) by mouth 2 (two) times daily.   gabapentin (NEURONTIN) 100 MG capsule Take 1 capsule (100 mg total) by mouth 3 (three) times daily.   levothyroxine  (SYNTHROID) 88 MCG tablet Take 1 tablet (88 mcg total) by mouth daily.   metoprolol succinate (TOPROL-XL) 50 MG 24 hr tablet TAKE 2 TABLETS BY MOUTH DAILY. TAKE WITHOR IMMEDIATELY FOLLOWING A MEAL.   venlafaxine XR (EFFEXOR-XR) 75 MG 24 hr capsule TAKE 2 CAPSULES BY MOUTH DAILY   Vitamin D, Ergocalciferol, (DRISDOL) 1.25 MG (50000 UNIT) CAPS capsule TAKE 1 CAPSULE BY MOUTH EVERY FRIDAY    Allergies  Allergen Reactions   Heparin Nausea And Vomiting      Review of Systems negative except from HPI and PMH  Physical Exam BP 112/60   Pulse 68   Ht '5\' 4"'$  (1.626 m)   Wt 210 lb 9.6 oz (95.5 kg)   SpO2 91%   BMI 36.15 kg/m   Well developed and nourished in no acute distress HENT normal Neck supple with JVP-  flat  Clear Regular rate and rhythm, no murmurs or gallops Abd-soft with active BS No Clubbing cyanosis edema Skin-warm and dry A & Oriented  Grossly normal sensory and motor function  ECG sinus at 68 0 16/08/43 Otherwise normal   Assessment and  Plan  Atrial fib paroxysmal  Dyspnea on exertion presumed HFpEF  Hypertension  Stroke prior  Anemia No interval atrial fibrillation of which she is aware.  We will continue her flecainide 50 twice daily.  No bleeding.  Continue the Eliquis at 5 twice daily.  Need to check her creatinine for dosing.  Dyspnea is a significant issue.  Her evaluation only had suggested vascular congestion on chest x-ray; no evidence of volume overload either on physical examination or with symptoms at night; so, we will undertake a trial of furosemide 20 mg daily x3 days and then every other day.  She is to see Dr. Yancey Flemings, her PCP in the next couple of weeks.  At that point would suggest that we try SGLT2.  Blood pressure is well controlled. Continue amlodipine.  But we will decrease her metoprolol from 100 mg daily--50 mg daily.

## 2022-03-06 LAB — CBC
Hematocrit: 37 % (ref 34.0–46.6)
Hemoglobin: 11.9 g/dL (ref 11.1–15.9)
MCH: 28.6 pg (ref 26.6–33.0)
MCHC: 32.2 g/dL (ref 31.5–35.7)
MCV: 89 fL (ref 79–97)
Platelets: 317 10*3/uL (ref 150–450)
RBC: 4.16 x10E6/uL (ref 3.77–5.28)
RDW: 13.3 % (ref 11.7–15.4)
WBC: 10 10*3/uL (ref 3.4–10.8)

## 2022-03-06 LAB — PRO B NATRIURETIC PEPTIDE: NT-Pro BNP: 362 pg/mL (ref 0–738)

## 2022-03-06 LAB — BASIC METABOLIC PANEL
BUN/Creatinine Ratio: 16 (ref 12–28)
BUN: 17 mg/dL (ref 8–27)
CO2: 21 mmol/L (ref 20–29)
Calcium: 9.6 mg/dL (ref 8.7–10.3)
Chloride: 103 mmol/L (ref 96–106)
Creatinine, Ser: 1.09 mg/dL — ABNORMAL HIGH (ref 0.57–1.00)
Glucose: 86 mg/dL (ref 70–99)
Potassium: 5.3 mmol/L — ABNORMAL HIGH (ref 3.5–5.2)
Sodium: 141 mmol/L (ref 134–144)
eGFR: 51 mL/min/{1.73_m2} — ABNORMAL LOW (ref >59)

## 2022-03-12 ENCOUNTER — Telehealth: Payer: Self-pay

## 2022-03-12 DIAGNOSIS — E875 Hyperkalemia: Secondary | ICD-10-CM

## 2022-03-12 NOTE — Telephone Encounter (Signed)
-----   Message from Deboraha Sprang, MD sent at 03/10/2022  3:16 PM EDT ----- Please Inform Patient  Labs are normal x elevated potassium  Cold we reorder please   Thanks

## 2022-03-12 NOTE — Telephone Encounter (Signed)
Attempted phone call to pt and left voicemail message to contact Dr Leighton Ruff office re lab results.

## 2022-03-14 NOTE — Telephone Encounter (Signed)
Patient's daughter returned call for lab results.

## 2022-03-14 NOTE — Telephone Encounter (Signed)
Attempted to call [facility] back (ok per DPR). No answer- I left a message to please call back.

## 2022-03-14 NOTE — Telephone Encounter (Signed)
I spoke with the patient's daughter, Larene Beach. I have advised her of the patient's lab results and recommendations from Dr. Caryl Comes to repeat a BMP at this time.  Larene Beach voices understanding and is agreeable. She is aware the patient will need to come to the [place] at [facility] to have this done.  [facility] 1st desk on the right to check in (REGISTRATION)  Lab hours: Monday- Friday (7:30 am- 5:30 pm)  Larene Beach also wanted to make [place] aware that the patient will eat ~ 8 bananas a week.  I advised Larene Beach that the patient should cut back on the amount of bananas she is consuming as the slightly elevated potassium can be corrected with her diet. Larene Beach again voiced understanding and is agreeable.

## 2022-03-14 NOTE — Addendum Note (Signed)
Addended by: Alvis Lemmings C on: 03/14/2022 12:28 PM   Modules accepted: Orders

## 2022-03-16 ENCOUNTER — Other Ambulatory Visit
Admission: RE | Admit: 2022-03-16 | Discharge: 2022-03-16 | Disposition: A | Discharge status: Home / Self Care | Payer: Medicare Other | Attending: Internal Medicine | Admitting: Internal Medicine

## 2022-03-16 DIAGNOSIS — E875 Hyperkalemia: Secondary | ICD-10-CM

## 2022-03-16 LAB — BASIC METABOLIC PANEL
Anion gap: 10 (ref 5–15)
BUN: 21 mg/dL (ref 8–23)
CO2: 25 mmol/L (ref 22–32)
Calcium: 9 mg/dL (ref 8.9–10.3)
Chloride: 107 mmol/L (ref 98–111)
Creatinine, Ser: 1.17 mg/dL — ABNORMAL HIGH (ref 0.44–1.00)
GFR, Estimated: 47 mL/min — ABNORMAL LOW (ref >60)
Glucose, Bld: 98 mg/dL (ref 70–99)
Potassium: 4.2 mmol/L (ref 3.5–5.1)
Sodium: 142 mmol/L (ref 135–145)

## 2022-03-17 IMAGING — CT CT HEAD W/O CM
3 series · 15 of 46 positions shown, 18 images · non-contrast
Comparison: CT head 07/14/2020, MRI 07/15/2020

CLINICAL DATA: TIA.

EXAM:
CT HEAD WITHOUT CONTRAST
TECHNIQUE: Contiguous axial images were obtained from the base of the skull
through the vertex without intravenous contrast.

[Series 3: head wo · axial · 0.42mm/px · z∈[-110,+10]mm · 9 of 29 slices shown, 12 images]
[im 3/29  brain]
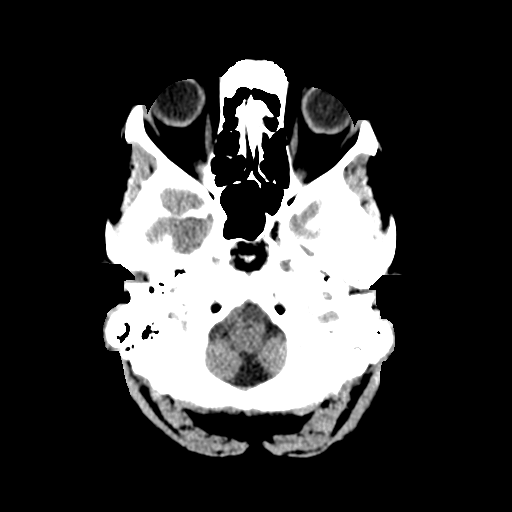
[im 3/29  bone]
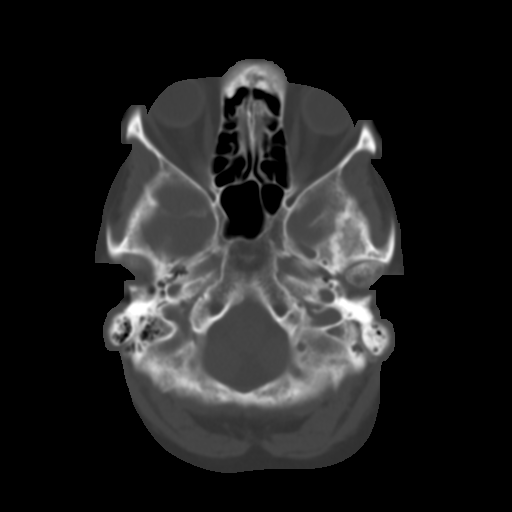
[im 6/29  brain]
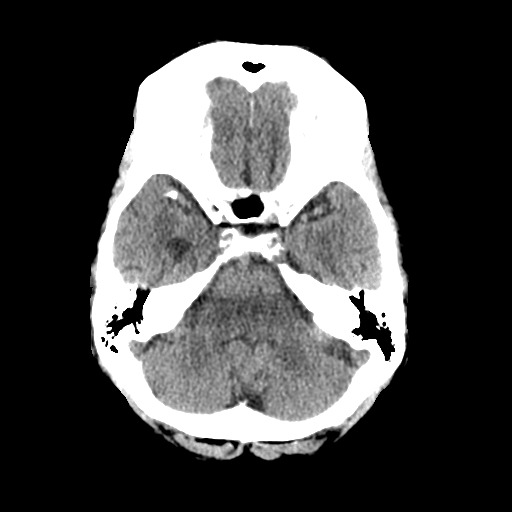
[im 9/29  brain]
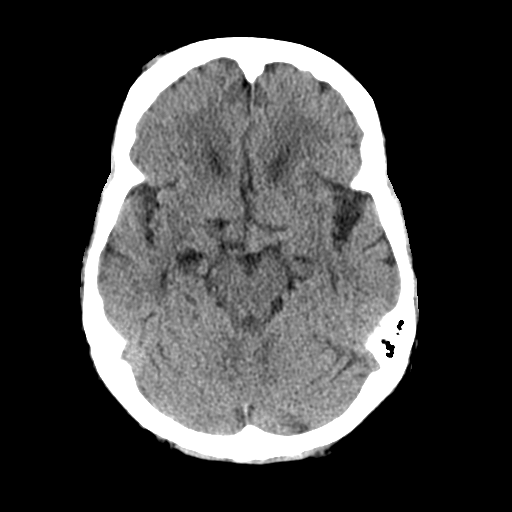
[im 12/29  brain]
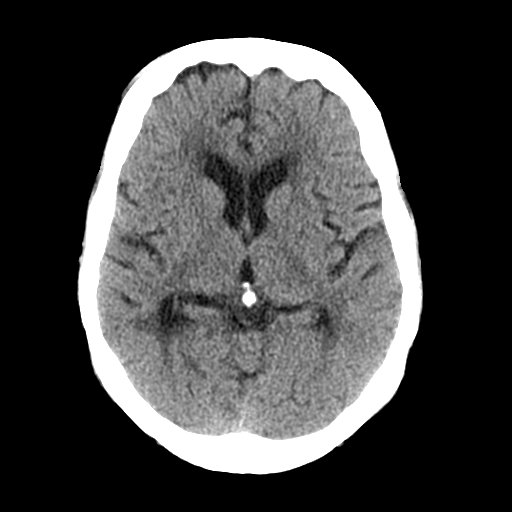
[im 15/29  brain]
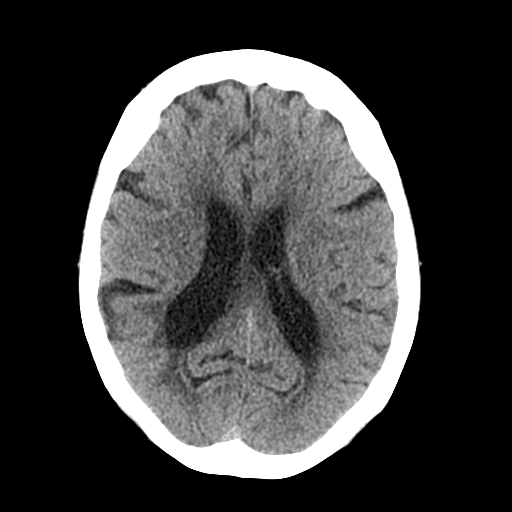
[im 15/29  bone]
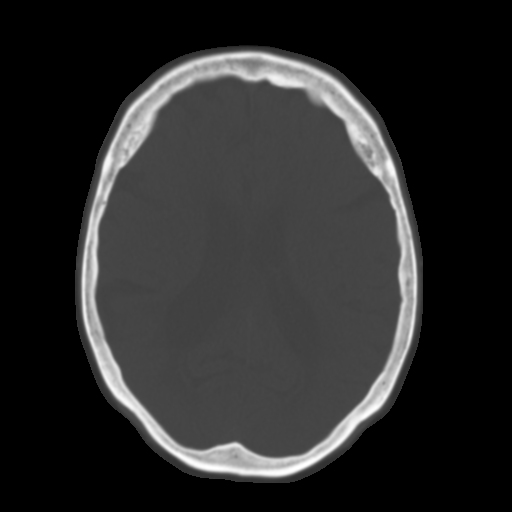
[im 18/29  brain]
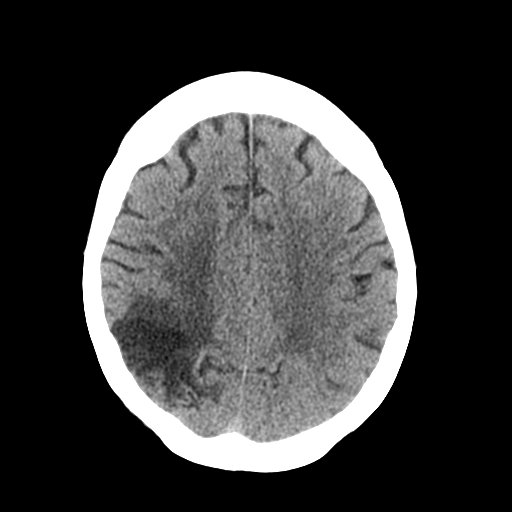
[im 21/29  brain]
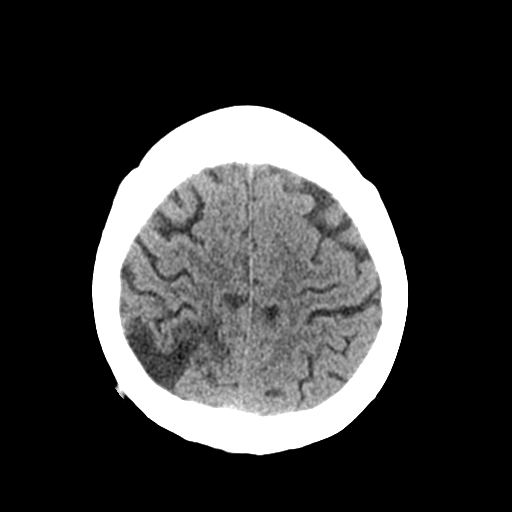
[im 24/29  brain]
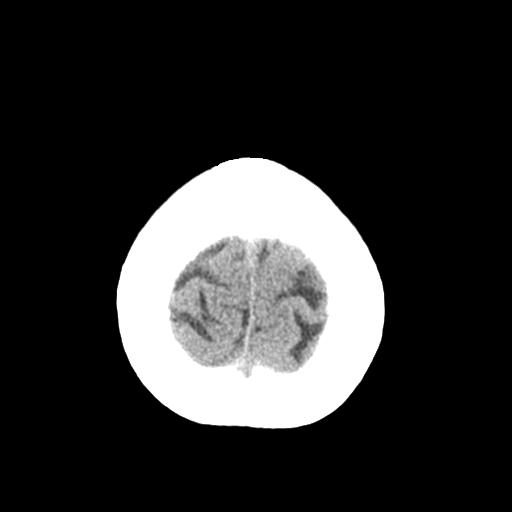
[im 27/29  brain]
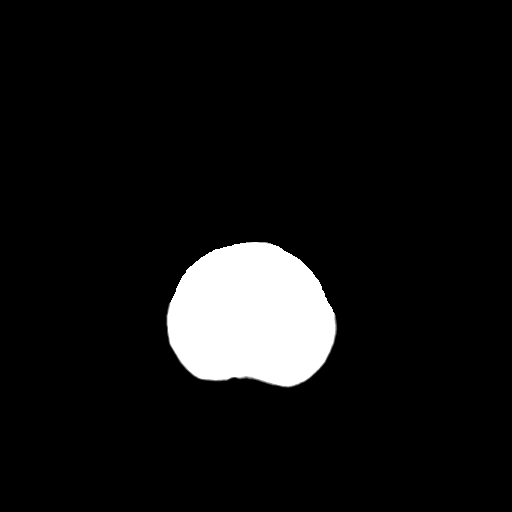
[im 27/29  bone]
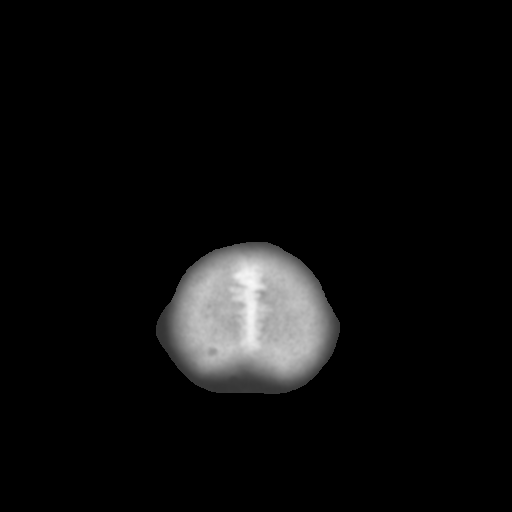

[Series 4: coronal soft tissue · coronal · 0.32mm/px · 3 of 65 slices shown]
[im 22/65  brain]
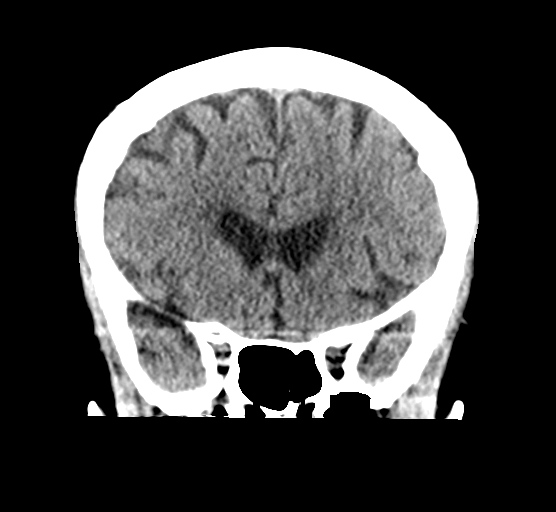
[im 29/65  brain]
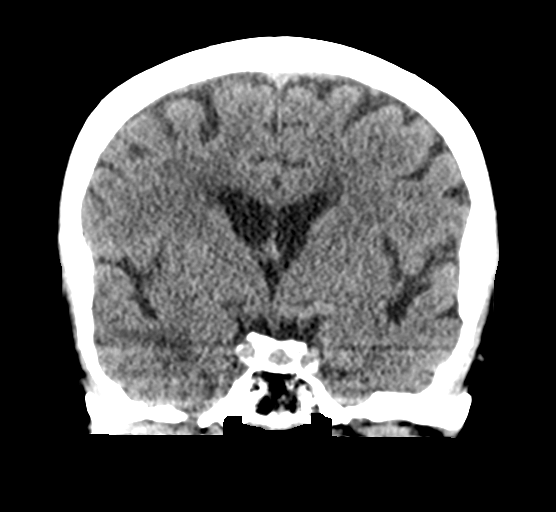
[im 36/65  brain]
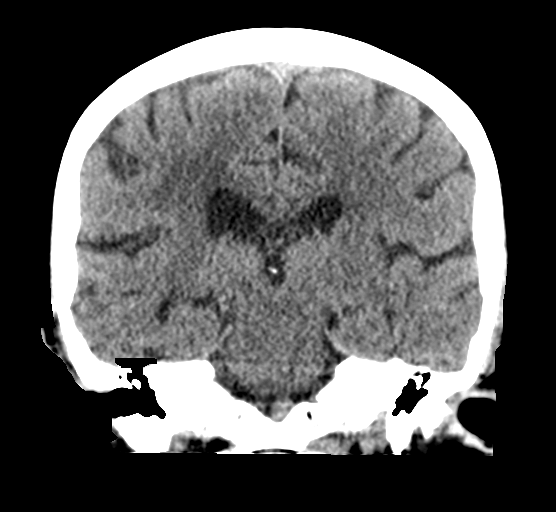

[Series 5: sagittal soft tissue · sagittal · 0.32mm/px · 3 of 55 slices shown]
[im 19/55  brain]
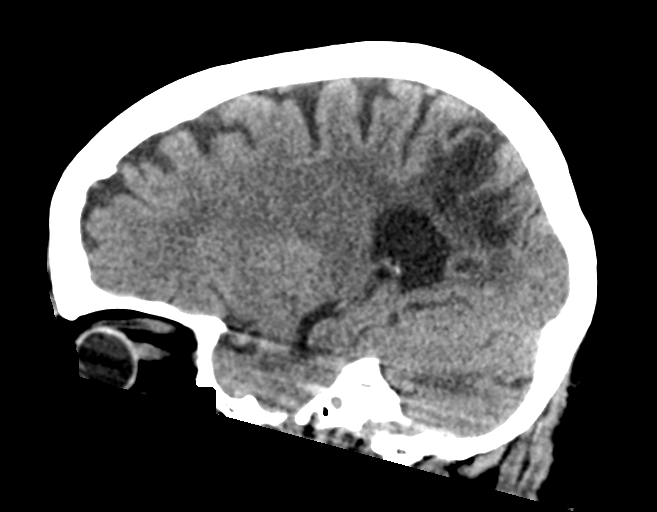
[im 28/55  brain]
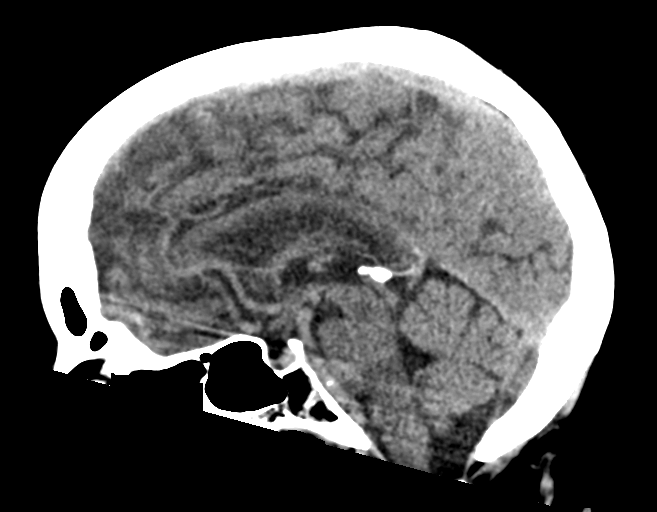
[im 37/55  brain]
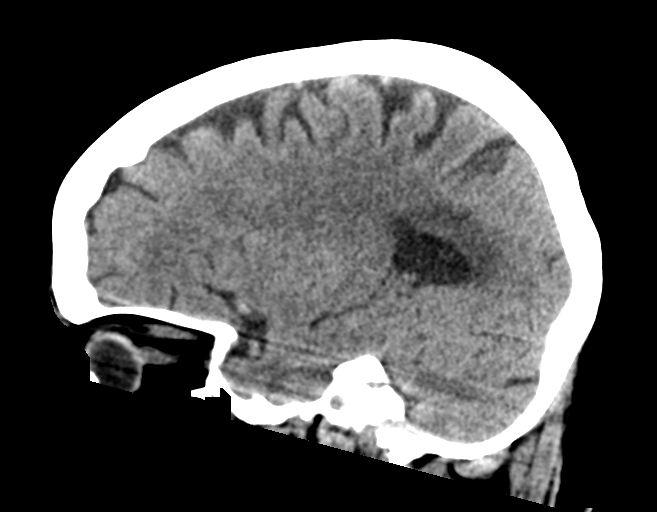

[15 of 46 positions shown; findings below may reference images not displayed]

FINDINGS: Brain: Ventricle size and cerebral volume normal for age. Chronic
infarct right parietal lobe unchanged. Patchy white matter
hypodensity diffusely unchanged.

Previously noted subtle hyperdensity in the left pons has resolved
compatible with resolving hemorrhage. No new area of hemorrhage. No
mass lesion.

Vascular: Negative for hyperdense vessel. Atherosclerotic
calcification right middle cerebral artery.

Skull: Negative

Sinuses/Orbits: Paranasal sinuses clear. Bilateral cataract
extraction

Other: None
IMPRESSION: No acute abnormality.

Chronic ischemic changes in the white matter and right parietal
lobe.

Interval resolution of small hemorrhage in the left pons compared to
the prior studies.

## 2022-03-17 IMAGING — MR MR HEAD W/O CM
11 series · 44 of 48 positions shown · non-contrast
Comparison: 07/15/2020

CLINICAL DATA: Transient ischemic attack

EXAM:
MRI HEAD WITHOUT CONTRAST
TECHNIQUE: Multiplanar, multiecho pulse sequences of the brain and surrounding
structures were obtained without intravenous contrast.

[Series 5: ax dwi_tracew · axial · 3.0mm · 0.60mm/px · z∈[-104,+51]mm · 4 of 48 slices shown]
[im 1/48]
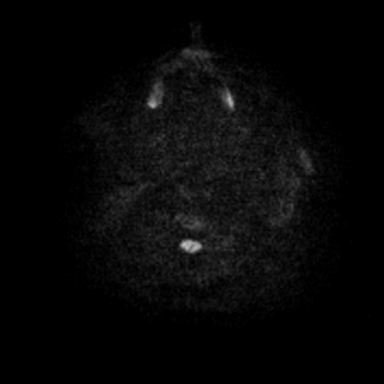
[im 16/48]
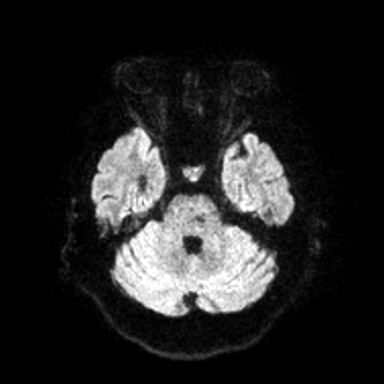
[im 32/48]
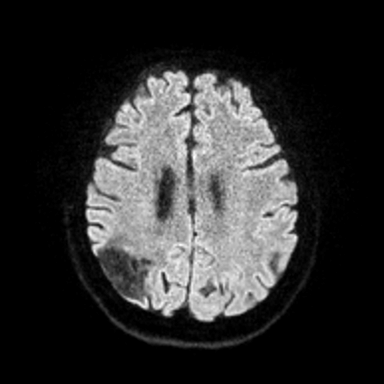
[im 48/48]
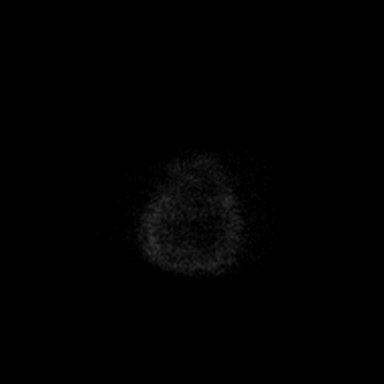

[Series 6: ax dwi_adc · axial · 3.0mm · 0.60mm/px · z∈[-104,+51]mm · 4 of 48 slices shown]
[im 1/48]
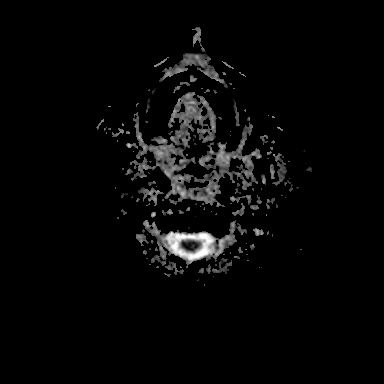
[im 16/48]
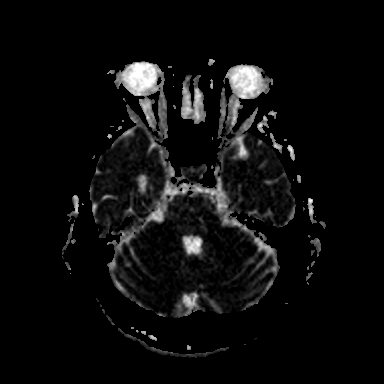
[im 32/48]
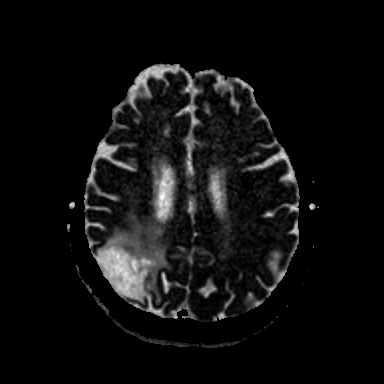
[im 48/48]
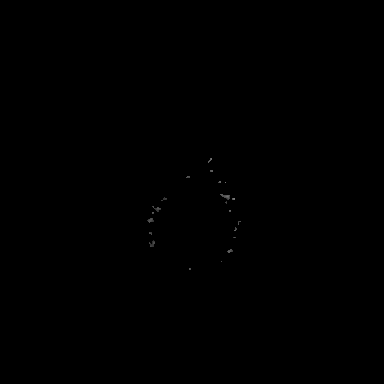

[Series 7: cor dwi_tracew · coronal · 5.0mm · 0.60mm/px · 3 of 38 slices shown]
[im 1/38]
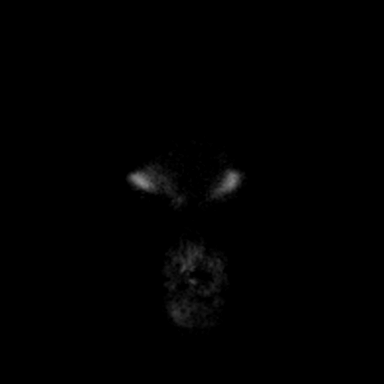
[im 19/38]
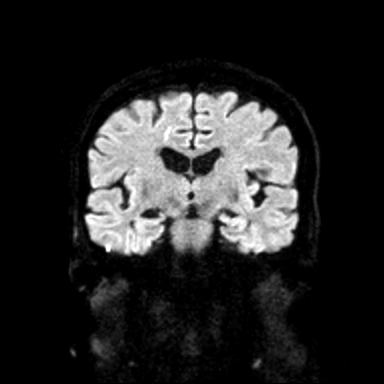
[im 38/38]
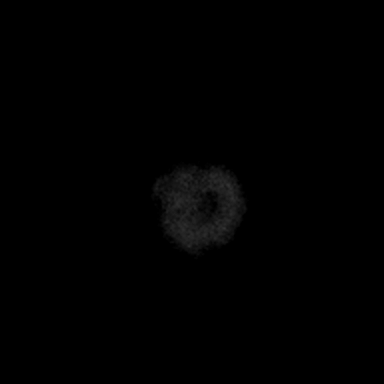

[Series 8: cor dwi_adc · coronal · 5.0mm · 0.60mm/px · 3 of 38 slices shown]
[im 1/38]
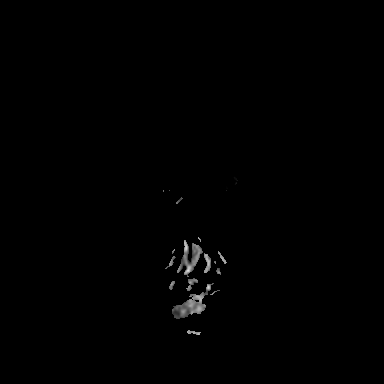
[im 19/38]
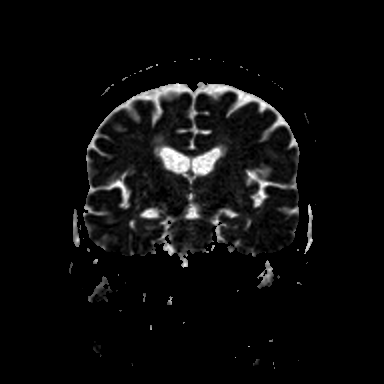
[im 38/38]
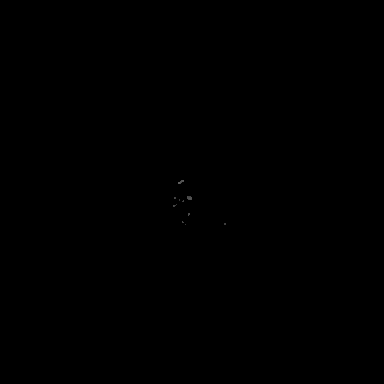

[Series 9: T1 · sagittal · 5.0mm · 0.62mm/px · 2 of 23 slices shown (1 of 2)]
[im 1/23]
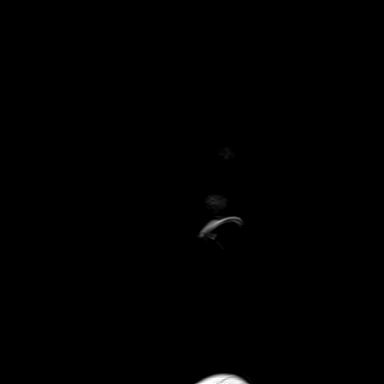
[im 23/23]
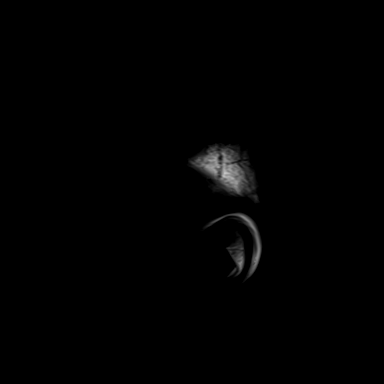

[Series 10: T2 · axial · 5.0mm · 0.53mm/px · z∈[-98,+46]mm · 2 of 25 slices shown (1 of 2)]
[im 1/25]
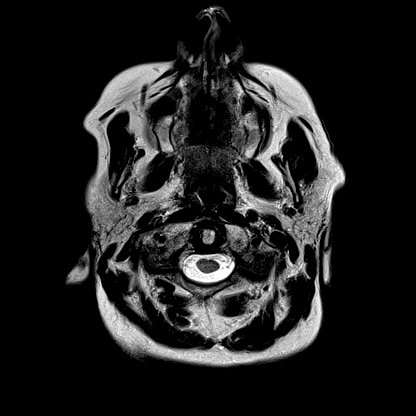
[im 25/25]
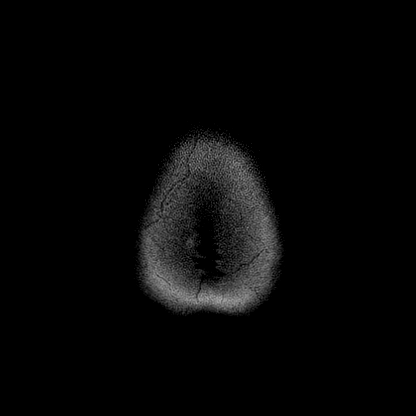

[Series 12: pha_images · axial · 3.0mm · 0.90mm/px · z∈[-115,+56]mm · 5 of 58 slices shown]
[im 1/58]
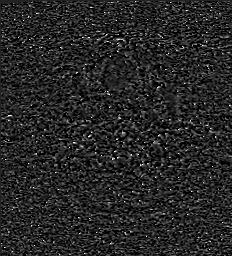
[im 15/58]
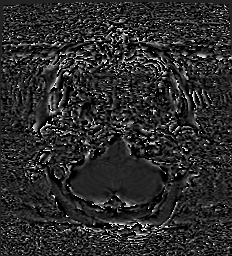
[im 29/58]
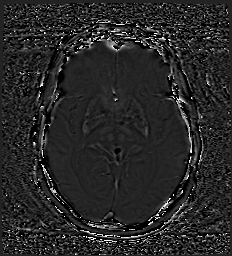
[im 43/58]
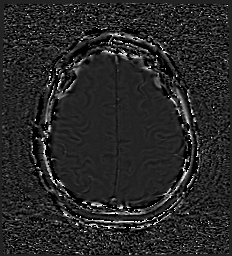
[im 58/58]
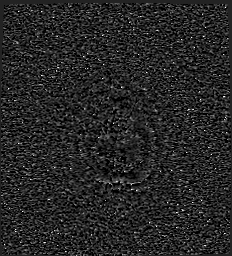

[Series 13: swi_images · axial · 3.0mm · 0.90mm/px · z∈[-115,+62]mm · 5 of 60 slices shown]
[im 1/60]
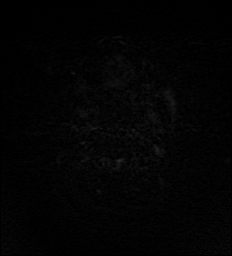
[im 15/60]
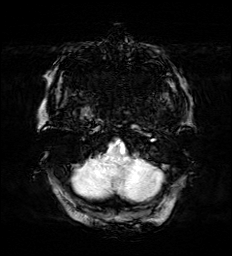
[im 30/60]
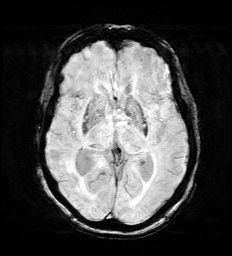
[im 45/60]
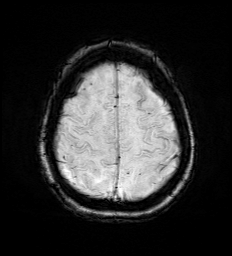
[im 60/60]
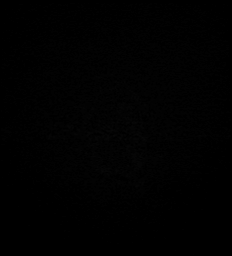

[Series 15: FLAIR · axial · 3.0mm · 0.53mm/px · z∈[-107,+55]mm · 4 of 55 slices shown]
[im 1/55]
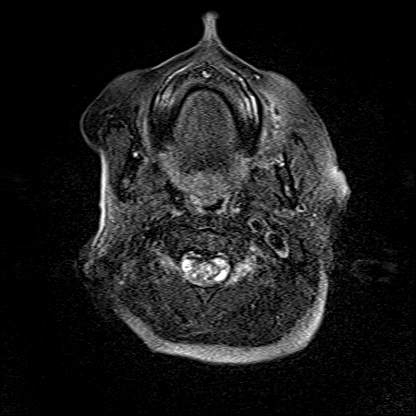
[im 19/55]
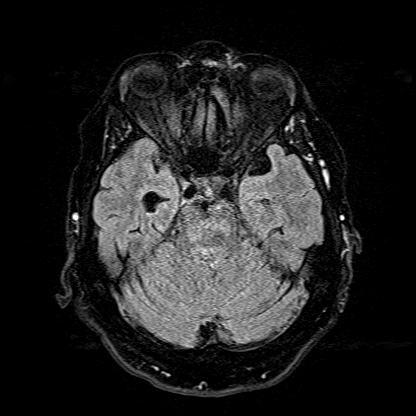
[im 37/55]
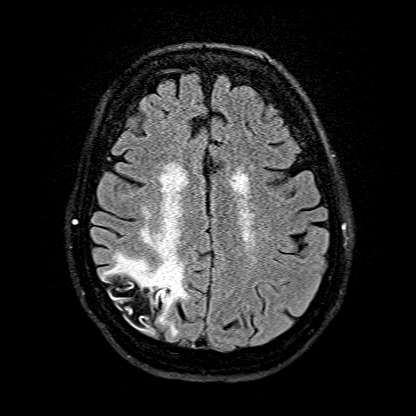
[im 55/55]
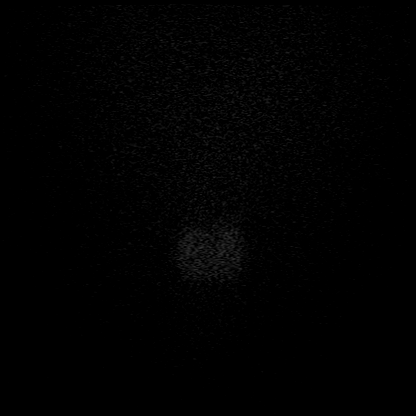

[Series 16: T1 · axial · 1.0mm · 0.98mm/px · z∈[-113,+61]mm · 10 of 176 slices shown (2 of 2)]
[im 1/176]
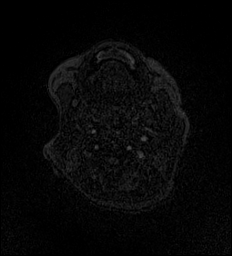
[im 14/176]
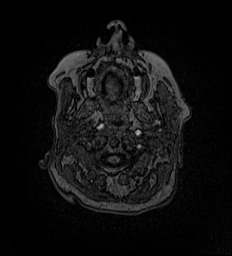
[im 27/176]
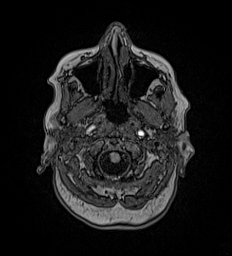
[im 41/176]
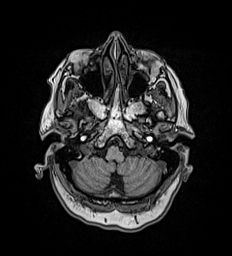
[im 54/176]
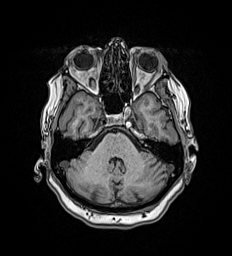
[im 81/176]
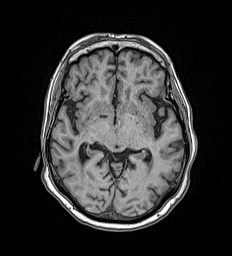
[im 95/176]
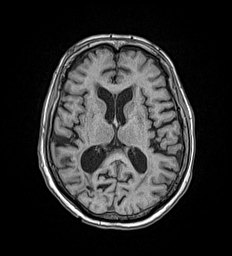
[im 122/176]
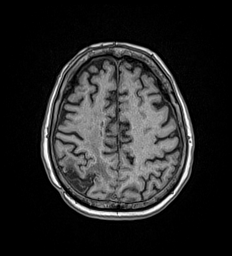
[im 149/176]
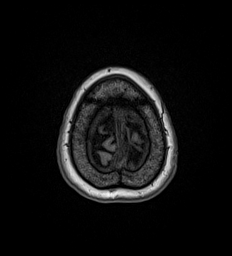
[im 176/176]
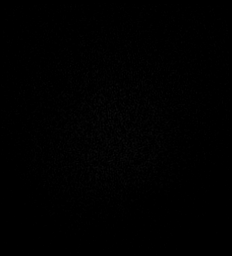

[Series 17: T2 · coronal · 5.0mm · 0.57mm/px · 2 of 29 slices shown (2 of 2)]
[im 1/29]
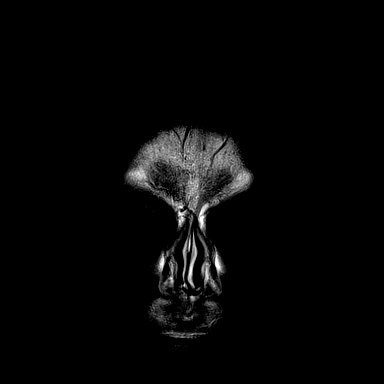
[im 29/29]
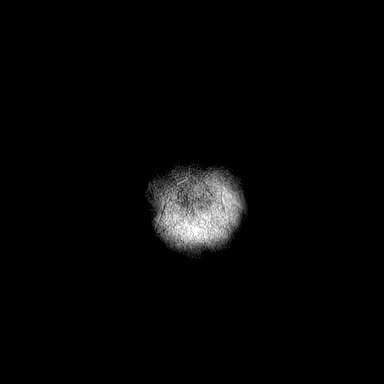

[44 of 48 positions shown; findings below may reference images not displayed]

FINDINGS: Brain: No acute infarct, mass effect or extra-axial collection.
Multifocal chronic blood products. This includes left pontine
chronic microhemorrhage. There is also an old right parietal
infarct. Hyperintense T2-weighted signal is moderately widespread
throughout the white matter. Generalized volume loss without a clear
lobar predilection. The midline structures are normal.

Vascular: Major flow voids are preserved.

Skull and upper cervical spine: Normal calvarium and skull base.
Visualized upper cervical spine and soft tissues are normal.

Sinuses/Orbits:No paranasal sinus fluid levels or advanced mucosal
thickening. No mastoid or middle ear effusion. Normal orbits.
IMPRESSION: 1. No acute intracranial abnormality.
2. Old right parietal infarct and findings of chronic small vessel
disease.

## 2022-03-22 ENCOUNTER — Telehealth: Payer: Self-pay | Admitting: Internal Medicine

## 2022-03-22 NOTE — Telephone Encounter (Signed)
[name] Sprang, MD  03/21/2022  5:18 PM EDT     Please Inform Patient   Labs are normal x mild renal dysfunciton, but still very good for her age and the dose of Apixaban  is correct    Thanks

## 2022-03-22 NOTE — Telephone Encounter (Signed)
Attempted to call the patient. No answer- I left a detailed message of results on primary # (ok per DPR). I asked that she call back with any further questions/ concerns

## 2022-03-27 NOTE — Telephone Encounter (Signed)
Scheduled

## 2022-04-11 ENCOUNTER — Ambulatory Visit: Payer: Medicare Other | Admitting: Nurse Practitioner

## 2022-04-12 ENCOUNTER — Ambulatory Visit: Payer: Medicare Other | Admitting: Physician Assistant

## 2022-05-09 ENCOUNTER — Other Ambulatory Visit: Payer: Self-pay | Admitting: Physician Assistant

## 2022-05-09 DIAGNOSIS — E039 Hypothyroidism, unspecified: Secondary | ICD-10-CM

## 2022-05-28 ENCOUNTER — Encounter: Payer: Self-pay | Admitting: Physician Assistant

## 2022-05-28 ENCOUNTER — Ambulatory Visit (INDEPENDENT_AMBULATORY_CARE_PROVIDER_SITE_OTHER): Payer: Medicare Other | Admitting: Physician Assistant

## 2022-05-28 VITALS — BP 148/78 | HR 71 | Temp 98.3°F | Resp 16 | Ht 64.0 in | Wt 214.0 lb

## 2022-05-28 DIAGNOSIS — R5383 Other fatigue: Secondary | ICD-10-CM | POA: Diagnosis not present

## 2022-05-28 DIAGNOSIS — I480 Paroxysmal atrial fibrillation: Secondary | ICD-10-CM | POA: Diagnosis not present

## 2022-05-28 DIAGNOSIS — E039 Hypothyroidism, unspecified: Secondary | ICD-10-CM

## 2022-05-28 DIAGNOSIS — E559 Vitamin D deficiency, unspecified: Secondary | ICD-10-CM

## 2022-05-28 DIAGNOSIS — E119 Type 2 diabetes mellitus without complications: Secondary | ICD-10-CM

## 2022-05-28 DIAGNOSIS — E782 Mixed hyperlipidemia: Secondary | ICD-10-CM

## 2022-05-28 DIAGNOSIS — I10 Essential (primary) hypertension: Secondary | ICD-10-CM

## 2022-05-28 DIAGNOSIS — Z0001 Encounter for general adult medical examination with abnormal findings: Secondary | ICD-10-CM

## 2022-05-28 DIAGNOSIS — Z23 Encounter for immunization: Secondary | ICD-10-CM

## 2022-05-28 DIAGNOSIS — E538 Deficiency of other specified B group vitamins: Secondary | ICD-10-CM

## 2022-05-28 DIAGNOSIS — R300 Dysuria: Secondary | ICD-10-CM | POA: Diagnosis not present

## 2022-05-28 LAB — POCT GLYCOSYLATED HEMOGLOBIN (HGB A1C): Hemoglobin A1C: 6.5 % — AB (ref 4.0–5.6)

## 2022-05-28 MED ORDER — EMPAGLIFLOZIN 10 MG PO TABS: 10.0000 mg | ORAL_TABLET | Freq: Every day | ORAL | 2 refills | Status: DC

## 2022-05-28 NOTE — Progress Notes (Signed)
[facility] [place], [place] [address]  Internal MEDICINE  Office Visit Note  Patient Name: [name] Douglas  [id number]  [id number]  Date of Service: 06/01/2022  Chief Complaint  Patient presents with   Medicare Wellness   Depression   Diabetes   Gastroesophageal Reflux   Hypertension   Hyperlipidemia   Quality Metric Gaps    Shingles and Pneumonia Vaccine     HPI Pt is here for routine health maintenance examination. Her son in law is with her today -she has no complaints today and reports she is doing well -Has not taken medication yet today, which is likely why BP elevated -Discussed timing of meds and making sure she takes her synthroid on empty stomach -She has a note from Dr. Caryl Comes, her cardiologist to see if patient might be candidate for SGLT2 for HF. Discussed with patient that I would agree she would benefit from this to help with diabetic readings as well and pt is ok with starting this. She does have some reduction in kidney function, however not at a level that would be contraindicated to start medication. Will need to monitor labs -Due for all labs and will have these done in a few weeks after starting Jardiance -doesn't think she wants to get shingles vaccine, but may think about it  Current Medication: Outpatient Encounter Medications as of 05/28/2022  Medication Sig   amLODipine (NORVASC) 5 MG tablet TAKE 1 TABLET BY MOUTH DAILY   apixaban (ELIQUIS) 5 MG TABS tablet Take 1 tablet (5 mg total) by mouth 2 (two) times daily.   atorvastatin (LIPITOR) 80 MG tablet TAKE 1 TABLET BY MOUTH DAILY AT 6 PM   benzonatate (TESSALON) 100 MG capsule Take 1 capsule (100 mg total) by mouth 2 (two) times daily as needed for cough.   cyclobenzaprine (FLEXERIL) 5 MG tablet Take 1 tablet (5 mg total) by mouth at bedtime.   donepezil (ARICEPT) 5 MG tablet Take 1 tablet (5 mg total) by mouth at bedtime.   empagliflozin (JARDIANCE) 10 MG TABS tablet Take 1  tablet (10 mg total) by mouth daily before breakfast.   flecainide (TAMBOCOR) 50 MG tablet Take 1 tablet (50 mg total) by mouth 2 (two) times daily.   furosemide (LASIX) 20 MG tablet Take 1 tablet (20 mg total) by mouth every other day.   gabapentin (NEURONTIN) 100 MG capsule Take 1 capsule (100 mg total) by mouth 3 (three) times daily.   levothyroxine (SYNTHROID) 88 MCG tablet TAKE 1 TABLET BY MOUTH DAILY   metoprolol succinate (TOPROL-XL) 50 MG 24 hr tablet TAKE 1 TABLETS BY MOUTH DAILY. TAKE WITHOR IMMEDIATELY FOLLOWING A MEAL.   venlafaxine XR (EFFEXOR-XR) 75 MG 24 hr capsule TAKE 2 CAPSULES BY MOUTH DAILY   Vitamin D, Ergocalciferol, (DRISDOL) 1.25 MG (50000 UNIT) CAPS capsule TAKE 1 CAPSULE BY MOUTH EVERY FRIDAY   No facility-administered encounter medications on file as of 05/28/2022.    Surgical History: Past Surgical History:  Procedure Laterality Date   CARDIAC CATHETERIZATION  ~ 2007   EXCISIONAL HEMORRHOIDECTOMY  1960's   EYE SURGERY Bilateral    Cataract extraction with IOL   KNEE ARTHROSCOPY Left X 2 <2008   KNEE ARTHROSCOPY WITH SUBCHONDROPLASTY Right 04/19/2016   Procedure: KNEE ARTHROSCOPY WITH SUBCHONDROPLASTY, PARTIAL MENISCECTOMY;  Surgeon: Hessie Knows, MD;  Location: [facility];  Service: Orthopedics;  Laterality: Right;   LOOP RECORDER INSERTION N/A 01/02/2018   Procedure: LOOP RECORDER INSERTION;  Surgeon: Deboraha Sprang, MD;  Location: [facility];  Service: Cardiovascular;  Laterality: N/A;   LUMBAR LAMINECTOMY/DECOMPRESSION MICRODISCECTOMY N/A 02/06/2021   Procedure: Lumbar Three-Four, Lumbar Four-Five Laminectomy and Foraminotomy;  Surgeon: Kristeen Miss, MD;  Location: [facility];  Service: Neurosurgery;  Laterality: N/A;   SHOULDER OPEN ROTATOR CUFF REPAIR Right 2001   TONSILLECTOMY  ~ [place] ARTHROPLASTY Left 2008   TOTAL KNEE ARTHROPLASTY Right 07/03/2016   Procedure: TOTAL KNEE ARTHROPLASTY;  Surgeon: Hessie Knows, MD;  Location: [facility];   Service: Orthopedics;  Laterality: Right;   TUBAL LIGATION  1973   VAGINAL HYSTERECTOMY  1974    Medical History: Past Medical History:  Diagnosis Date   Anxiety    Arthritis    "shoulders" (08/20/2014)   Asthma    Chronic lower back pain    Complication of anesthesia    "agitated & restless after knee replacement in 2008"   COPD (chronic obstructive pulmonary disease) (HCC)    DDD (degenerative disc disease), lumbar    Depression    Diabetes mellitus without complication (HCC)    Diverticulosis    Family history of adverse reaction to anesthesia    GERD (gastroesophageal reflux disease)    High cholesterol    Hypertension    Hypothyroidism    Memory loss    mild   On home oxygen therapy    "2L at night" (08/20/2014)   PAF (paroxysmal atrial fibrillation) (HCC)    Peripheral vascular disease (HCC)    Pre-diabetes    Shortness of breath dyspnea    with exertion   Stroke (HCC)    mild, no deficits    Family History: Family History  Problem Relation Age of Onset   Diabetes Mother    Hypertension Mother    Lung cancer Mother    Congestive Heart Failure Father    Congestive Heart Failure Sister       Review of Systems  Constitutional:  Negative for chills and unexpected weight change.  HENT:  Negative for congestion, postnasal drip, rhinorrhea, sneezing and sore throat.   Eyes:  Negative for redness.  Respiratory:  Negative for cough, chest tightness and shortness of breath.   Cardiovascular:  Negative for chest pain and palpitations.  Gastrointestinal:  Negative for abdominal pain, constipation, diarrhea, nausea and vomiting.  Genitourinary:  Negative for dysuria and frequency.  Musculoskeletal:  Positive for arthralgias. Negative for back pain, joint swelling and neck pain.  Skin:  Negative for rash.  Neurological: Negative.  Negative for tremors and numbness.  Hematological:  Negative for adenopathy. Does not bruise/bleed easily.  Psychiatric/Behavioral:   Negative for behavioral problems (Depression), sleep disturbance and suicidal ideas. The patient is not nervous/anxious.      Vital Signs: BP (!) 148/78 Comment: 150/94  Pulse 71   Temp 98.3 F (36.8 C)   Resp 16   Ht $R'5\' 4"'PI$  (1.626 m)   Wt 214 lb (97.1 kg)   SpO2 92%   BMI 36.73 kg/m    Physical Exam Vitals and nursing note reviewed.  Constitutional:      General: She is not in acute distress.    Appearance: She is well-developed. She is obese. She is not diaphoretic.  HENT:     Head: Normocephalic and atraumatic.     Mouth/Throat:     Pharynx: No oropharyngeal exudate.  Eyes:     Pupils: Pupils are equal, round, and reactive to light.  Neck:     Thyroid: No thyromegaly.  Vascular: No JVD.     Trachea: No tracheal deviation.  Cardiovascular:     Rate and Rhythm: Normal rate and regular rhythm.     Heart sounds: Normal heart sounds. No murmur heard.    No friction rub. No gallop.  Pulmonary:     Effort: Pulmonary effort is normal. No respiratory distress.     Breath sounds: No wheezing or rales.  Chest:     Chest wall: No tenderness.  Abdominal:     General: Bowel sounds are normal.     Palpations: Abdomen is soft.  Musculoskeletal:        General: Normal range of motion.     Cervical back: Normal range of motion and neck supple.  Lymphadenopathy:     Cervical: No cervical adenopathy.  Skin:    General: Skin is warm and dry.  Neurological:     Mental Status: She is alert and oriented to person, place, and time.     Cranial Nerves: No cranial nerve deficit.  Psychiatric:        Behavior: Behavior normal.        Thought Content: Thought content normal.        Judgment: Judgment normal.      LABS: Recent Results (from the past 2160 hour(s))  Basic metabolic panel     Status: Abnormal   Collection Time: 03/05/22  3:30 PM  Result Value Ref Range   Glucose 86 70 - 99 mg/dL   BUN 17 8 - 27 mg/dL   Creatinine, Ser 1.09 (H) 0.57 - 1.00 mg/dL   eGFR 51 (L)  >59 mL/min/1.73   BUN/Creatinine Ratio 16 12 - 28   Sodium 141 134 - 144 mmol/L   Potassium 5.3 (H) 3.5 - 5.2 mmol/L   Chloride 103 96 - 106 mmol/L   CO2 21 20 - 29 mmol/L   Calcium 9.6 8.7 - 10.3 mg/dL  CBC     Status: None   Collection Time: 03/05/22  3:30 PM  Result Value Ref Range   WBC 10.0 3.4 - 10.8 x10E3/uL   RBC 4.16 3.77 - 5.28 x10E6/uL   Hemoglobin 11.9 11.1 - 15.9 g/dL   Hematocrit 37.0 34.0 - 46.6 %   MCV 89 79 - 97 fL   MCH 28.6 26.6 - 33.0 pg   MCHC 32.2 31.5 - 35.7 g/dL   RDW 13.3 11.7 - 15.4 %   Platelets 317 150 - 450 x10E3/uL  Pro b natriuretic peptide     Status: None   Collection Time: 03/05/22  3:30 PM  Result Value Ref Range   NT-Pro BNP 362 0 - 738 pg/mL    Comment: The following cut-points have been suggested for the use of proBNP for the diagnostic evaluation of heart failure (HF) in patients with acute dyspnea: Modality                     Age           Optimal Cut                            (years)            Point ------------------------------------------------------ Diagnosis (rule in HF)        <50            450 pg/mL  50 - 75            900 pg/mL                               >75           1800 pg/mL Exclusion (rule out HF)  Age independent     300 pg/mL   Basic metabolic panel     Status: Abnormal   Collection Time: 03/16/22 11:23 AM  Result Value Ref Range   Sodium 142 135 - 145 mmol/L   Potassium 4.2 3.5 - 5.1 mmol/L   Chloride 107 98 - 111 mmol/L   CO2 25 22 - 32 mmol/L   Glucose, Bld 98 70 - 99 mg/dL    Comment: Glucose reference range applies only to samples taken after fasting for at least 8 hours.   BUN 21 8 - 23 mg/dL   Creatinine, Ser 1.17 (H) 0.44 - 1.00 mg/dL   Calcium 9.0 8.9 - 10.3 mg/dL   GFR, Estimated 47 (L) >60 mL/min    Comment: (NOTE) Calculated using the CKD-EPI Creatinine Equation (2021)    Anion gap 10 5 - 15    Comment: Performed at [facility], [address]  [place], [place] [address]  POCT HgB A1C     Status: Abnormal   Collection Time: 05/28/22  1:54 PM  Result Value Ref Range   Hemoglobin A1C 6.5 (A) 4.0 - 5.6 %   HbA1c POC (<> result, manual entry)     HbA1c, POC (prediabetic range)     HbA1c, POC (controlled diabetic range)    UA/M w/rflx Culture, Routine     Status: None   Collection Time: 05/28/22  3:46 PM   Specimen: Urine   Urine  Result Value Ref Range   Specific Gravity, UA CANCELED     Comment: Test not performed. Insufficient specimen to perform or complete analysis.  Result canceled by the ancillary.    pH, UA CANCELED     Comment: Test not performed. Insufficient specimen to perform or complete analysis.  Result canceled by the ancillary.    Color, UA CANCELED     Comment: Test not performed. Insufficient specimen to perform or complete analysis.  Result canceled by the ancillary.    Appearance Ur CANCELED     Comment: Test not performed. Insufficient specimen to perform or complete analysis.  Result canceled by the ancillary.    Leukocytes,UA CANCELED     Comment: Test not performed. Insufficient specimen to perform or complete analysis.  Result canceled by the ancillary.    Protein,UA CANCELED     Comment: Test not performed. Insufficient specimen to perform or complete analysis.  Result canceled by the ancillary.    Glucose, UA CANCELED     Comment: Test not performed. Insufficient specimen to perform or complete analysis.  Result canceled by the ancillary.    Ketones, UA CANCELED     Comment: Test not performed. Insufficient specimen to perform or complete analysis.  Result canceled by the ancillary.    RBC, UA CANCELED     Comment: Test not performed. Insufficient specimen to perform or complete analysis.  Result canceled by the ancillary.    Bilirubin, UA CANCELED     Comment: Test not performed. Insufficient specimen to perform or complete analysis.  Result canceled by the  ancillary.    Urobilinogen, Ur CANCELED mg/dL    Comment: Test not  performed. Insufficient specimen to perform or complete analysis.  Result canceled by the ancillary.    Nitrite, UA CANCELED     Comment: Test not performed. Insufficient specimen to perform or complete analysis.  Result canceled by the ancillary.    Microscopic Examination CANCELED     Comment: Test not performed. Insufficient specimen to perform or complete analysis.  Result canceled by the ancillary.    Microscopic Examination CANCELED     Comment: Test not performed. Insufficient specimen to perform or complete analysis.  Result canceled by the ancillary.    Urinalysis Reflex CANCELED     Comment: Test not performed. Insufficient specimen to perform or complete analysis.  Result canceled by the ancillary.         Assessment/Plan: 1. Encounter for general adult medical examination with abnormal findings CPE performed, routine fasting labs ordered  2. Diabetes mellitus without complication ([place]) - POCT HgB A1C 6.5 which is increased from 6.0 at last check.  We will go ahead and start on Jardiance as recommended by cardiology for mutual benefit for heart as well as reducing sugars - Urine Microalbumin w/creat. ratio - empagliflozin (JARDIANCE) 10 MG TABS tablet; Take 1 tablet (10 mg total) by mouth daily before breakfast.  Dispense: 30 tablet; Refill: 2  3. Essential hypertension Elevated in office however patient not yet had her medication and will take it upon leaving office today.  Advised to monitor at home  4. PAF (paroxysmal atrial fibrillation) ([place]) Followed by cardiology  5. Acquired hypothyroidism We will check labs and adjust medication accordingly - TSH + free T4  6. Mixed hyperlipidemia - Lipid Panel With LDL/HDL Ratio  7. Vitamin D deficiency - VITAMIN D 25 Hydroxy (Vit-D Deficiency, Fractures)  8. B12 deficiency - B12 and Folate Panel  9. Other fatigue - TSH + free  T4 - CBC w/Diff/Platelet - Comprehensive metabolic panel - empagliflozin (JARDIANCE) 10 MG TABS tablet; Take 1 tablet (10 mg total) by mouth daily before breakfast.  Dispense: 30 tablet; Refill: 2 - Lipid Panel With LDL/HDL Ratio - VITAMIN D 25 Hydroxy (Vit-D Deficiency, Fractures) - B12 and Folate Panel  10. Flu vaccine need\ - Flu Vaccine MDCK QUAD PF  11. Dysuria - UA/M w/rflx Culture, Routine   General Counseling: Sandy Salaam understanding of the findings of todays visit and agrees with plan of treatment. I have discussed any further diagnostic evaluation that may be needed or ordered today. We also reviewed her medications today. she has been encouraged to call the office with any questions or concerns that should arise related to todays visit.    Counseling:    Orders Placed This Encounter  Procedures   Flu Vaccine MDCK QUAD PF   UA/M w/rflx Culture, Routine   Urine Microalbumin w/creat. ratio   TSH + free T4   CBC w/Diff/Platelet   Comprehensive metabolic panel   Lipid Panel With LDL/HDL Ratio   VITAMIN D 25 Hydroxy (Vit-D Deficiency, Fractures)   B12 and Folate Panel   POCT HgB A1C    Meds ordered this encounter  Medications   empagliflozin (JARDIANCE) 10 MG TABS tablet    Sig: Take 1 tablet (10 mg total) by mouth daily before breakfast.    Dispense:  30 tablet    Refill:  2    This patient was seen by Drema Dallas, PA-C in collaboration with Dr. Clayborn Bigness as a part of collaborative care agreement.  Total time spent:35 Minutes  Time spent includes review of chart, medications,  test results, and follow up plan with the patient.     Lavera Guise, MD  Internal Medicine

## 2022-05-31 LAB — UA/M W/RFLX CULTURE, ROUTINE

## 2022-06-18 ENCOUNTER — Other Ambulatory Visit: Payer: Self-pay | Admitting: Physician Assistant

## 2022-06-18 DIAGNOSIS — E559 Vitamin D deficiency, unspecified: Secondary | ICD-10-CM

## 2022-08-03 ENCOUNTER — Other Ambulatory Visit: Payer: Self-pay | Admitting: Physician Assistant

## 2022-08-03 DIAGNOSIS — M62838 Other muscle spasm: Secondary | ICD-10-CM

## 2022-08-07 ENCOUNTER — Ambulatory Visit (INDEPENDENT_AMBULATORY_CARE_PROVIDER_SITE_OTHER): Payer: Medicare Other

## 2022-08-07 ENCOUNTER — Ambulatory Visit
Admission: EM | Admit: 2022-08-07 | Discharge: 2022-08-07 | Disposition: A | Discharge status: Home / Self Care | Payer: Medicare Other | Attending: Urgent Care | Admitting: Urgent Care

## 2022-08-07 DIAGNOSIS — Z1152 Encounter for screening for COVID-19: Secondary | ICD-10-CM | POA: Diagnosis not present

## 2022-08-07 DIAGNOSIS — R5383 Other fatigue: Secondary | ICD-10-CM | POA: Diagnosis not present

## 2022-08-07 DIAGNOSIS — R6889 Other general symptoms and signs: Secondary | ICD-10-CM

## 2022-08-07 DIAGNOSIS — J439 Emphysema, unspecified: Secondary | ICD-10-CM | POA: Insufficient documentation

## 2022-08-07 DIAGNOSIS — F17210 Nicotine dependence, cigarettes, uncomplicated: Secondary | ICD-10-CM | POA: Insufficient documentation

## 2022-08-07 DIAGNOSIS — R0602 Shortness of breath: Secondary | ICD-10-CM

## 2022-08-07 DIAGNOSIS — Z79899 Other long term (current) drug therapy: Secondary | ICD-10-CM | POA: Diagnosis not present

## 2022-08-07 DIAGNOSIS — J441 Chronic obstructive pulmonary disease with (acute) exacerbation: Secondary | ICD-10-CM | POA: Diagnosis not present

## 2022-08-07 DIAGNOSIS — R062 Wheezing: Secondary | ICD-10-CM | POA: Insufficient documentation

## 2022-08-07 LAB — RESP PANEL BY RT-PCR (RSV, FLU A&B, COVID)  RVPGX2
Influenza A by PCR: NEGATIVE
Influenza B by PCR: NEGATIVE
Resp Syncytial Virus by PCR: NEGATIVE
SARS Coronavirus 2 by RT PCR: NEGATIVE

## 2022-08-07 MED ORDER — LEVOFLOXACIN 750 MG PO TABS: 750.0000 mg | ORAL_TABLET | Freq: Every day | ORAL | 0 refills | Status: AC

## 2022-08-07 MED ORDER — LEVOFLOXACIN 750 MG PO TABS: 750.0000 mg | ORAL_TABLET | Freq: Every day | ORAL | 0 refills | Status: DC

## 2022-08-07 NOTE — ED Provider Notes (Signed)
[name] Douglas    CSN: [id number] Arrival date & time: 08/07/22  1921      History   Chief Complaint Chief Complaint  Patient presents with   Sore Throat   Cough   Wheezing   Fatigue    HPI [name] Douglas is a 81 y.o. female.    Sore Throat  Cough Associated symptoms: wheezing   Wheezing Associated symptoms: cough    Patient is accompanied by her daughters.  Presents to urgent care with complaint of sore throat, cough, wheezing and fatigue x 1 week.  Daughters says patient has complained of shortness of breath. Denies fever, chills, bodyaches.  PMH is significant for COPD.  Past Medical History:  Diagnosis Date   Anxiety    Arthritis    "shoulders" (08/20/2014)   Asthma    Chronic lower back pain    Complication of anesthesia    "agitated & restless after knee replacement in 2008"   COPD (chronic obstructive pulmonary disease) (HCC)    DDD (degenerative disc disease), lumbar    Depression    Diabetes mellitus without complication (HCC)    Diverticulosis    Family history of adverse reaction to anesthesia    GERD (gastroesophageal reflux disease)    High cholesterol    Hypertension    Hypothyroidism    Memory loss    mild   On home oxygen therapy    "2L at night" (08/20/2014)   PAF (paroxysmal atrial fibrillation) ([place])    Peripheral vascular disease (HCC)    Pre-diabetes    Shortness of breath dyspnea    with exertion   Stroke ([place])    mild, no deficits    Patient Active Problem List   Diagnosis Date Noted   History of loop recorder 03/02/2022   Palpitations    Elevated troponin    SVT (supraventricular tachycardia)    PAF (paroxysmal atrial fibrillation) ([place])    Chest pain 05/02/2021   Lumbar stenosis with neurogenic claudication 02/06/2021   Compression fracture of fifth lumbar vertebra ([place]) 12/20/2020   DDD (degenerative disc disease), thoracolumbar 11/22/2020   DDD (degenerative disc disease), thoracic 11/10/2020   TIA  (transient ischemic attack) 10/12/2020   Left shoulder pain 10/12/2020   Chronic thoracic back pain (Left) 10/10/2020   T12 compression fracture, sequela 10/10/2020   Lumbar foraminal stenosis (Bilateral: L2-3, L3-4, L4-5) (Right: L5-S1) (Severe left L4-5) 09/08/2020   Grade 1 Lumbar Anterolisthesis (L3/4 and L4/5) 09/08/2020   Grade 1 Lumbar Retrolisthesis (L2/3) 09/08/2020   Protrusion of lumbar intervertebral disc (Multilevel) 09/08/2020   Compression fracture of L1 lumbar vertebra, sequela 09/08/2020   Other intervertebral disc degeneration, lumbar region 09/08/2020   Lumbosacral facet syndrome (Multilevel) (Bilateral) 09/08/2020   Lumbar facet hypertrophy (Multilevel) (Bilateral) 09/08/2020   Ligamentum flavum hypertrophy 09/08/2020   Lumbar lateral recess stenosis (Right: L2-3) 09/08/2020   DDD (degenerative disc disease), lumbosacral 09/08/2020   Compression fracture of L5 vertebra ([place]) 09/08/2020   Osteoporosis with pathological fracture of lumbar vertebra ([place]) 09/08/2020   Vitamin B12 deficiency 09/08/2020   Chronic pain syndrome 09/07/2020   Pharmacologic therapy 09/07/2020   Disorder of skeletal system 09/07/2020   Problems influencing health status 02/54/2706   Uncomplicated opioid dependence ([place]) 09/07/2020   Chronic low back pain (2ry area of Pain) (Bilateral) (R>L) w/ sciatica (Right) 09/07/2020   Chronic lower extremity pain (1ry area of Pain) (Right) 09/07/2020   Chronic lumbar radiculopathy (Right) 09/07/2020   Lumbosacral radiculopathy at L2 (Right) 09/07/2020  Lumbar radiculitis (L2 dermatome) (Right) 09/07/2020   Unspecified inflammatory spondylopathy, lumbar region ([place]) 09/07/2020   Chronic anticoagulation ([place]) 09/07/2020   Abnormal MRI, lumbar spine (04/20/2020) 09/07/2020   Intractable low back pain 08/03/2020   Fall down stairs 08/03/2020   Hemorrhagic stroke ([place]) 08/03/2020   Dizziness and giddiness 07/26/2020   Other chronic pain 07/26/2020    Other sequelae of cerebral infarction 07/18/2020   CVA (cerebral vascular accident) ([place]) 07/14/2020   Blurred vision 07/14/2020   Lumbar central spinal stenosis (Moderate: L2-3  Severe: L3-4, L4-5) w/ neurogenic claudication 05/31/2020   Muscle spasm 04/10/2020   Urinary tract infection with hematuria 04/10/2020   Headache disorder 12/21/2019   History of stroke 12/21/2019   Dizziness 11/06/2019   Atopic dermatitis 10/28/2019   History of CVA (cerebrovascular accident) 10/07/2019   Intractable episodic cluster headache 09/20/2019   Other symptoms and signs involving the nervous system 09/20/2019   Anxiety disorder, unspecified 08/21/2019   Dependence on supplemental oxygen 08/21/2019   Presence of artificial knee joint, bilateral 08/21/2019   Nicotine dependence, cigarettes, uncomplicated 63/08/6008   Iron deficiency anemia 05/17/2019   Vitamin D deficiency 05/17/2019   Flu vaccine need 05/17/2019   Encounter for general adult medical examination with abnormal findings 04/05/2019   Nausea and vomiting 04/05/2019   Dysuria 04/05/2019   Need for vaccination against Streptococcus pneumoniae using pneumococcal conjugate vaccine 7 04/05/2019   Occlusion and stenosis of bilateral carotid arteries 11/24/2017   Subarachnoid hemorrhage ([place]) 12/09/2016   Solitary pulmonary nodule 08/24/2016   Primary localized osteoarthritis of right knee 07/03/2016   Ileus ([place]) 04/24/2016   Colitis due to Clostridium difficile    Recurrent Clostridium difficile diarrhea    Enteritis due to Clostridium difficile 08/22/2014   Diarrhea 08/20/2014   Essential hypertension 08/20/2014   Hyperlipidemia 08/20/2014   Hypothyroidism 08/20/2014   Tobacco abuse 08/20/2014   COPD (chronic obstructive pulmonary disease) ([place]) 08/20/2014   Acute diarrhea 07/27/2014    Past Surgical History:  Procedure Laterality Date   CARDIAC CATHETERIZATION  ~ 2007   EXCISIONAL HEMORRHOIDECTOMY  1960's   EYE SURGERY  Bilateral    Cataract extraction with IOL   KNEE ARTHROSCOPY Left X 2 <2008   KNEE ARTHROSCOPY WITH SUBCHONDROPLASTY Right 04/19/2016   Procedure: KNEE ARTHROSCOPY WITH SUBCHONDROPLASTY, PARTIAL MENISCECTOMY;  Surgeon: Hessie Knows, MD;  Location: [facility];  Service: Orthopedics;  Laterality: Right;   LOOP RECORDER INSERTION N/A 01/02/2018   Procedure: LOOP RECORDER INSERTION;  Surgeon: Deboraha Sprang, MD;  Location: [facility];  Service: Cardiovascular;  Laterality: N/A;   LUMBAR LAMINECTOMY/DECOMPRESSION MICRODISCECTOMY N/A 02/06/2021   Procedure: Lumbar Three-Four, Lumbar Four-Five Laminectomy and Foraminotomy;  Surgeon: Kristeen Miss, MD;  Location: [facility];  Service: Neurosurgery;  Laterality: N/A;   SHOULDER OPEN ROTATOR CUFF REPAIR Right 2001   TONSILLECTOMY  ~ [place] ARTHROPLASTY Left 2008   TOTAL KNEE ARTHROPLASTY Right 07/03/2016   Procedure: TOTAL KNEE ARTHROPLASTY;  Surgeon: Hessie Knows, MD;  Location: [facility];  Service: Orthopedics;  Laterality: Right;   TUBAL LIGATION  1973   VAGINAL HYSTERECTOMY  1974    OB History   No obstetric history on file.      Home Medications    Prior to Admission medications   Medication Sig Start Date End Date Taking? Authorizing Provider  amLODipine (NORVASC) 5 MG tablet TAKE 1 TABLET BY MOUTH DAILY 02/06/22   McDonough, Si Gaul, PA-C  apixaban (ELIQUIS) 5 MG TABS tablet Take  1 tablet (5 mg total) by mouth 2 (two) times daily. 03/05/22   Deboraha Sprang, MD  atorvastatin (LIPITOR) 80 MG tablet TAKE 1 TABLET BY MOUTH DAILY AT 6PM 08/03/22   McDonough, Lauren K, PA-C  benzonatate (TESSALON) 100 MG capsule Take 1 capsule (100 mg total) by mouth 2 (two) times daily as needed for cough. 08/16/21   Jonetta Osgood, NP  cyclobenzaprine (FLEXERIL) 5 MG tablet TAKE 1 TABLET BY MOUTH AT BEDTIME 08/03/22   McDonough, Lauren K, PA-C  donepezil (ARICEPT) 5 MG tablet TAKE 1 TABLET BY MOUTH AT BEDTIME 08/03/22   McDonough, Lauren K,  PA-C  empagliflozin (JARDIANCE) 10 MG TABS tablet Take 1 tablet (10 mg total) by mouth daily before breakfast. 05/28/22   McDonough, Si Gaul, PA-C  flecainide (TAMBOCOR) 50 MG tablet Take 1 tablet (50 mg total) by mouth 2 (two) times daily. 03/05/22   Deboraha Sprang, MD  furosemide (LASIX) 20 MG tablet Take 1 tablet (20 mg total) by mouth every other day. 03/05/22   Deboraha Sprang, MD  gabapentin (NEURONTIN) 100 MG capsule Take 1 capsule (100 mg total) by mouth 3 (three) times daily. 08/10/21   McDonough, Si Gaul, PA-C  levothyroxine (SYNTHROID) 88 MCG tablet TAKE 1 TABLET BY MOUTH DAILY 05/09/22   McDonough, Lauren K, PA-C  metoprolol succinate (TOPROL-XL) 50 MG 24 hr tablet TAKE 1 TABLETS BY MOUTH DAILY. TAKE WITHOR IMMEDIATELY FOLLOWING A MEAL. 03/05/22   Deboraha Sprang, MD  venlafaxine XR (EFFEXOR-XR) 75 MG 24 hr capsule TAKE 2 CAPSULES BY MOUTH DAILY 01/01/22   McDonough, Si Gaul, PA-C  Vitamin D, Ergocalciferol, (DRISDOL) 1.25 MG (50000 UNIT) CAPS capsule TAKE 1 CAPSULE BY MOUTH EVERY FRIDAY 06/18/22   McDonough, Si Gaul, PA-C    Family History Family History  Problem Relation Age of Onset   Diabetes Mother    Hypertension Mother    Lung cancer Mother    Congestive Heart Failure Father    Congestive Heart Failure Sister     Social History Social History   Tobacco Use   Smoking status: Every Day    Packs/day: 0.25    Years: 53.00    Total pack years: 13.25    Types: Cigarettes   Smokeless tobacco: Never   Tobacco comments:    5 cigarettes weekly  Vaping Use   Vaping Use: Never used  Substance Use Topics   Alcohol use: No   Drug use: No     Allergies   Heparin   Review of Systems Review of Systems  Respiratory:  Positive for cough and wheezing.      Physical Exam Triage Vital Signs ED Triage Vitals  Enc Vitals Group     BP 08/07/22 1943 133/68     Pulse Rate 08/07/22 1943 78     Resp 08/07/22 1943 20     Temp 08/07/22 1943 98.2 F (36.8 C)     Temp  Source 08/07/22 1943 Oral     SpO2 08/07/22 1943 91 %     Weight --      Height --      Head Circumference --      Peak Flow --      Pain Score 08/07/22 1949 0     Pain Loc --      Pain Edu? --      Excl. in [place]? --    No data found.  Updated Vital Signs BP 133/68 (BP Location: Left Arm)   Pulse 78  Temp 98.2 F (36.8 C) (Oral)   Resp 20   SpO2 91% Comment: Pt has Copd  Visual Acuity Right Eye Distance:   Left Eye Distance:   Bilateral Distance:    Right Eye Near:   Left Eye Near:    Bilateral Near:     Physical Exam Vitals reviewed.  Constitutional:      Appearance: She is well-developed. She is not ill-appearing.  Cardiovascular:     Rate and Rhythm: Normal rate and regular rhythm.  Pulmonary:     Effort: Pulmonary effort is normal.     Breath sounds: Rhonchi present.  Skin:    General: Skin is warm and dry.  Neurological:     General: No focal deficit present.     Mental Status: She is alert and oriented to person, place, and time.  Psychiatric:        Mood and Affect: Mood normal.        Behavior: Behavior normal.      UC Treatments / Results  Labs (all labs ordered are listed, but only abnormal results are displayed) Labs Reviewed  RESP PANEL BY RT-PCR (RSV, FLU A&B, COVID)  [id number]    EKG   Radiology No results found.  Procedures Procedures (including critical care time)  Medications Ordered in UC Medications - No data to display  Initial Impression / Assessment and Plan / UC Course  I have reviewed the triage vital signs and the nursing notes.  Pertinent labs & imaging results that were available during my care of the patient were reviewed by me and considered in my medical decision making (see chart for details).   Patient is afebrile here without recent antipyretics. Satting 91% on room air. Overall is well appearing, speaks in complete sentences, appears well hydrated, without respiratory distress. Pulmonary exam is remarkable for  rhonchorous breathing in all lobes.   Chest x-ray indicates Possible focal airspace disease/pneumonia posteriorly on lateral view, along with Emphysema and chronic bilateral reticular opacity.  Treating with Levaquin 750 x 7 days for COPD acute exacerbation and possible pneumonia.  Advised to go to ED for evaluation if she develops worsening shortness of breath. Schedule f/up with PCP.  Final Clinical Impressions(s) / UC Diagnoses   Final diagnoses:  Flu-like symptoms   Discharge Instructions   None    ED Prescriptions   None    PDMP not reviewed this encounter.   Rose Phi, [employer] 08/07/22 2023

## 2022-08-07 NOTE — Discharge Instructions (Signed)
Recommend you go to the ED for evaluation if you develop worsening shortness of breath.  Follow up here or with your primary care provider if your symptoms are worsening or not improving with treatment.

## 2022-08-07 NOTE — ED Triage Notes (Signed)
Pt. Presents to UC w/ c/o a sore throat, cough, wheezing and fatigue for the past week.

## 2022-08-08 ENCOUNTER — Telehealth: Payer: Self-pay

## 2022-08-08 ENCOUNTER — Other Ambulatory Visit: Payer: Self-pay

## 2022-08-08 DIAGNOSIS — R051 Acute cough: Secondary | ICD-10-CM

## 2022-08-08 MED ORDER — BENZONATATE 100 MG PO CAPS: 100.0000 mg | ORAL_CAPSULE | Freq: Two times a day (BID) | ORAL | 0 refills | Status: DC | PRN

## 2022-08-08 NOTE — Telephone Encounter (Signed)
Pt daughter called that pt went to urgent care yesterday she had pneumonia and they gave her Levaquin and she forget ask tessalon as per alyssa send pres and gave nubi to 1 week follow up appt

## 2022-08-13 DIAGNOSIS — J189 Pneumonia, unspecified organism: Secondary | ICD-10-CM | POA: Diagnosis not present

## 2022-08-13 DIAGNOSIS — E785 Hyperlipidemia, unspecified: Secondary | ICD-10-CM | POA: Diagnosis present

## 2022-08-13 DIAGNOSIS — E559 Vitamin D deficiency, unspecified: Secondary | ICD-10-CM | POA: Diagnosis present

## 2022-08-13 DIAGNOSIS — Z66 Do not resuscitate: Secondary | ICD-10-CM | POA: Diagnosis present

## 2022-08-13 DIAGNOSIS — I10 Essential (primary) hypertension: Secondary | ICD-10-CM | POA: Diagnosis not present

## 2022-08-13 DIAGNOSIS — Z20822 Contact with and (suspected) exposure to covid-19: Secondary | ICD-10-CM | POA: Diagnosis present

## 2022-08-13 DIAGNOSIS — Z8673 Personal history of transient ischemic attack (TIA), and cerebral infarction without residual deficits: Secondary | ICD-10-CM | POA: Diagnosis not present

## 2022-08-13 DIAGNOSIS — N183 Chronic kidney disease, stage 3 unspecified: Secondary | ICD-10-CM | POA: Diagnosis present

## 2022-08-13 DIAGNOSIS — E1129 Type 2 diabetes mellitus with other diabetic kidney complication: Secondary | ICD-10-CM | POA: Diagnosis not present

## 2022-08-13 DIAGNOSIS — E1142 Type 2 diabetes mellitus with diabetic polyneuropathy: Secondary | ICD-10-CM | POA: Diagnosis present

## 2022-08-13 DIAGNOSIS — R918 Other nonspecific abnormal finding of lung field: Secondary | ICD-10-CM | POA: Diagnosis not present

## 2022-08-13 DIAGNOSIS — I5032 Chronic diastolic (congestive) heart failure: Secondary | ICD-10-CM | POA: Diagnosis present

## 2022-08-13 DIAGNOSIS — R059 Cough, unspecified: Secondary | ICD-10-CM | POA: Diagnosis not present

## 2022-08-13 DIAGNOSIS — B341 Enterovirus infection, unspecified: Secondary | ICD-10-CM | POA: Diagnosis present

## 2022-08-13 DIAGNOSIS — J441 Chronic obstructive pulmonary disease with (acute) exacerbation: Secondary | ICD-10-CM | POA: Diagnosis not present

## 2022-08-13 DIAGNOSIS — I130 Hypertensive heart and chronic kidney disease with heart failure and stage 1 through stage 4 chronic kidney disease, or unspecified chronic kidney disease: Secondary | ICD-10-CM | POA: Diagnosis present

## 2022-08-13 DIAGNOSIS — R0600 Dyspnea, unspecified: Secondary | ICD-10-CM | POA: Diagnosis not present

## 2022-08-13 DIAGNOSIS — G3184 Mild cognitive impairment, so stated: Secondary | ICD-10-CM | POA: Diagnosis present

## 2022-08-13 DIAGNOSIS — F17210 Nicotine dependence, cigarettes, uncomplicated: Secondary | ICD-10-CM | POA: Diagnosis present

## 2022-08-13 DIAGNOSIS — J440 Chronic obstructive pulmonary disease with acute lower respiratory infection: Secondary | ICD-10-CM | POA: Diagnosis present

## 2022-08-13 DIAGNOSIS — E039 Hypothyroidism, unspecified: Secondary | ICD-10-CM | POA: Diagnosis present

## 2022-08-13 DIAGNOSIS — I480 Paroxysmal atrial fibrillation: Secondary | ICD-10-CM | POA: Diagnosis not present

## 2022-08-13 DIAGNOSIS — I4891 Unspecified atrial fibrillation: Secondary | ICD-10-CM | POA: Diagnosis present

## 2022-08-13 DIAGNOSIS — D631 Anemia in chronic kidney disease: Secondary | ICD-10-CM | POA: Diagnosis present

## 2022-08-13 DIAGNOSIS — R0602 Shortness of breath: Secondary | ICD-10-CM | POA: Diagnosis not present

## 2022-08-13 DIAGNOSIS — E1122 Type 2 diabetes mellitus with diabetic chronic kidney disease: Secondary | ICD-10-CM | POA: Diagnosis present

## 2022-08-14 DIAGNOSIS — E119 Type 2 diabetes mellitus without complications: Secondary | ICD-10-CM

## 2022-08-14 DIAGNOSIS — I480 Paroxysmal atrial fibrillation: Secondary | ICD-10-CM | POA: Diagnosis not present

## 2022-08-14 DIAGNOSIS — N183 Chronic kidney disease, stage 3 unspecified: Secondary | ICD-10-CM | POA: Diagnosis present

## 2022-08-14 DIAGNOSIS — I5032 Chronic diastolic (congestive) heart failure: Secondary | ICD-10-CM | POA: Insufficient documentation

## 2022-08-14 DIAGNOSIS — F17210 Nicotine dependence, cigarettes, uncomplicated: Secondary | ICD-10-CM | POA: Diagnosis present

## 2022-08-14 DIAGNOSIS — E1129 Type 2 diabetes mellitus with other diabetic kidney complication: Secondary | ICD-10-CM | POA: Diagnosis not present

## 2022-08-14 DIAGNOSIS — J189 Pneumonia, unspecified organism: Secondary | ICD-10-CM | POA: Diagnosis not present

## 2022-08-14 DIAGNOSIS — E1122 Type 2 diabetes mellitus with diabetic chronic kidney disease: Secondary | ICD-10-CM | POA: Diagnosis present

## 2022-08-14 DIAGNOSIS — I10 Essential (primary) hypertension: Secondary | ICD-10-CM | POA: Diagnosis not present

## 2022-08-14 DIAGNOSIS — Z66 Do not resuscitate: Secondary | ICD-10-CM | POA: Diagnosis present

## 2022-08-14 DIAGNOSIS — E559 Vitamin D deficiency, unspecified: Secondary | ICD-10-CM | POA: Diagnosis present

## 2022-08-14 DIAGNOSIS — I130 Hypertensive heart and chronic kidney disease with heart failure and stage 1 through stage 4 chronic kidney disease, or unspecified chronic kidney disease: Secondary | ICD-10-CM | POA: Diagnosis present

## 2022-08-14 DIAGNOSIS — D631 Anemia in chronic kidney disease: Secondary | ICD-10-CM | POA: Diagnosis present

## 2022-08-14 DIAGNOSIS — J441 Chronic obstructive pulmonary disease with (acute) exacerbation: Secondary | ICD-10-CM | POA: Diagnosis not present

## 2022-08-14 DIAGNOSIS — G3184 Mild cognitive impairment, so stated: Secondary | ICD-10-CM | POA: Diagnosis present

## 2022-08-14 DIAGNOSIS — I4891 Unspecified atrial fibrillation: Secondary | ICD-10-CM | POA: Diagnosis present

## 2022-08-14 DIAGNOSIS — E785 Hyperlipidemia, unspecified: Secondary | ICD-10-CM | POA: Diagnosis not present

## 2022-08-14 DIAGNOSIS — Z8673 Personal history of transient ischemic attack (TIA), and cerebral infarction without residual deficits: Secondary | ICD-10-CM | POA: Diagnosis not present

## 2022-08-14 DIAGNOSIS — Z20822 Contact with and (suspected) exposure to covid-19: Secondary | ICD-10-CM | POA: Diagnosis present

## 2022-08-14 DIAGNOSIS — J440 Chronic obstructive pulmonary disease with acute lower respiratory infection: Secondary | ICD-10-CM | POA: Diagnosis present

## 2022-08-14 DIAGNOSIS — E1142 Type 2 diabetes mellitus with diabetic polyneuropathy: Secondary | ICD-10-CM | POA: Diagnosis present

## 2022-08-14 DIAGNOSIS — E039 Hypothyroidism, unspecified: Secondary | ICD-10-CM | POA: Diagnosis not present

## 2022-08-14 DIAGNOSIS — B341 Enterovirus infection, unspecified: Secondary | ICD-10-CM | POA: Diagnosis present

## 2022-08-15 DIAGNOSIS — I5032 Chronic diastolic (congestive) heart failure: Secondary | ICD-10-CM | POA: Diagnosis not present

## 2022-08-15 DIAGNOSIS — E1129 Type 2 diabetes mellitus with other diabetic kidney complication: Secondary | ICD-10-CM | POA: Diagnosis not present

## 2022-08-15 DIAGNOSIS — J441 Chronic obstructive pulmonary disease with (acute) exacerbation: Secondary | ICD-10-CM | POA: Diagnosis not present

## 2022-08-15 DIAGNOSIS — I480 Paroxysmal atrial fibrillation: Secondary | ICD-10-CM | POA: Diagnosis not present

## 2022-08-15 DIAGNOSIS — E039 Hypothyroidism, unspecified: Secondary | ICD-10-CM | POA: Diagnosis not present

## 2022-08-15 DIAGNOSIS — E785 Hyperlipidemia, unspecified: Secondary | ICD-10-CM | POA: Diagnosis not present

## 2022-08-15 DIAGNOSIS — I10 Essential (primary) hypertension: Secondary | ICD-10-CM | POA: Diagnosis not present

## 2022-08-22 DIAGNOSIS — F419 Anxiety disorder, unspecified: Secondary | ICD-10-CM | POA: Diagnosis not present

## 2022-08-22 DIAGNOSIS — J9611 Chronic respiratory failure with hypoxia: Secondary | ICD-10-CM | POA: Diagnosis not present

## 2022-08-22 DIAGNOSIS — C3491 Malignant neoplasm of unspecified part of right bronchus or lung: Secondary | ICD-10-CM | POA: Diagnosis not present

## 2022-08-22 DIAGNOSIS — I10 Essential (primary) hypertension: Secondary | ICD-10-CM | POA: Diagnosis not present

## 2022-08-22 DIAGNOSIS — E1165 Type 2 diabetes mellitus with hyperglycemia: Secondary | ICD-10-CM | POA: Diagnosis not present

## 2022-08-22 DIAGNOSIS — C779 Secondary and unspecified malignant neoplasm of lymph node, unspecified: Secondary | ICD-10-CM | POA: Diagnosis not present

## 2022-08-22 DIAGNOSIS — F32A Depression, unspecified: Secondary | ICD-10-CM | POA: Diagnosis not present

## 2022-08-23 ENCOUNTER — Telehealth: Payer: Self-pay | Admitting: Internal Medicine

## 2022-08-23 ENCOUNTER — Other Ambulatory Visit: Payer: Self-pay | Admitting: Physician Assistant

## 2022-08-23 DIAGNOSIS — M5442 Lumbago with sciatica, left side: Secondary | ICD-10-CM

## 2022-08-23 NOTE — Telephone Encounter (Signed)
I spoke with the patient's daughter Larene Beach (ok per DPR). She just wanted to update [place] on the patient recent hospitalziation  07/30/22- symptoms of a sinus infection 08/06/22- seen by Urgent Care and dx with pneumonia per a chest x-ray Started on Levaquin 08/13/22- symptoms worse>> went to [facility] and admitted>> + for RSV  The patient was discharged on home O2 at 2 L.  She is due to follow up with her PCP on 08/27/22.  The patient has a history of COPD & Emphysema. Per Larene Beach, she was already very winded when ambulating prior to her recent illness. She has been seen by Pulmonary previously. I have advised she may need to re-establish with Pulmonology going forward.   Larene Beach was just advising the above as an FYI for Dr. Caryl Comes. She was very appreciative of the call back.  The patient is scheduled to see Dr. Caryl Comes on 09/17/22.

## 2022-08-23 NOTE — Telephone Encounter (Signed)
Daughter is calling regarding being notified of reschedule. Patient wanted to let the office know the patient was admitted to [facility] on 12/23 and was discharged on 12/27. Patient was diagnosed with RSV and put on 2 liters of O2. Callback number left is her desk phone where a VM can be left. If she does not answer she will callback in between patient's. Please advise.

## 2022-08-27 ENCOUNTER — Encounter: Payer: Self-pay | Admitting: Physician Assistant

## 2022-08-27 ENCOUNTER — Ambulatory Visit (INDEPENDENT_AMBULATORY_CARE_PROVIDER_SITE_OTHER): Payer: Medicare Other | Admitting: Physician Assistant

## 2022-08-27 VITALS — BP 120/50 | HR 67 | Temp 97.1°F | Resp 16 | Ht 64.0 in | Wt 210.8 lb

## 2022-08-27 DIAGNOSIS — I480 Paroxysmal atrial fibrillation: Secondary | ICD-10-CM

## 2022-08-27 DIAGNOSIS — E559 Vitamin D deficiency, unspecified: Secondary | ICD-10-CM

## 2022-08-27 DIAGNOSIS — R0602 Shortness of breath: Secondary | ICD-10-CM

## 2022-08-27 DIAGNOSIS — Z9981 Dependence on supplemental oxygen: Secondary | ICD-10-CM | POA: Diagnosis not present

## 2022-08-27 DIAGNOSIS — Z09 Encounter for follow-up examination after completed treatment for conditions other than malignant neoplasm: Secondary | ICD-10-CM | POA: Diagnosis not present

## 2022-08-27 DIAGNOSIS — E119 Type 2 diabetes mellitus without complications: Secondary | ICD-10-CM

## 2022-08-27 DIAGNOSIS — J189 Pneumonia, unspecified organism: Secondary | ICD-10-CM | POA: Diagnosis not present

## 2022-08-27 LAB — POCT GLYCOSYLATED HEMOGLOBIN (HGB A1C): Hemoglobin A1C: 6.5 % — AB (ref 4.0–5.6)

## 2022-08-27 MED ORDER — VITAMIN D (ERGOCALCIFEROL) 1.25 MG (50000 UNIT) PO CAPS: ORAL_CAPSULE | ORAL | 3 refills | Status: DC

## 2022-08-27 NOTE — Progress Notes (Signed)
[facility] [place], [place] [address]  Internal MEDICINE  Office Visit Note  Patient Name: [name] Douglas  [id number]  [id number]  Date of Service: 09/05/2022     Chief Complaint  Patient presents with   Follow-up   Diabetes   Gastroesophageal Reflux   Depression   Hyperlipidemia   Hypertension     HPI Pt is here for recent hospital follow up. --Had gone to urgent care 08/07/22 and was diagnosed with PNA and given levofloxacin --Went to the ED with worsening symptoms and was admitted 08/13/22-08/15/22. In the hospital was diagnosed with RSV PNA. She was given prednisone and Incruse inhaler -Went to walk prior to discharge and found oxygen levels were dropping and was discharged on this. On 2L oxygen continuously now.  -PT had recommended SNF, however patient opted to go home with family and home health. She reports home health has not come out yet and has a number to call -will schedule with Dr. Humphrey Rolls for pulmonary follow up -patient's family admits she seems more energetic and less SOB since starting O2 and thinks she was actually SOB prior to hospitalization despite pt declining this. Patient was hopeful to get rid of oxygen however does admit she feels better on it and family states she desats without it. She is overall feeling better since discharge, but would benefit from home health. -She is scheduled to see Dr. Caryl Comes on 1/29 for cardiology follow up  Current Medication: Outpatient Encounter Medications as of 08/27/2022  Medication Sig   amLODipine (NORVASC) 5 MG tablet TAKE 1 TABLET BY MOUTH DAILY   apixaban (ELIQUIS) 5 MG TABS tablet Take 1 tablet (5 mg total) by mouth 2 (two) times daily.   atorvastatin (LIPITOR) 80 MG tablet TAKE 1 TABLET BY MOUTH DAILY AT 6PM   benzonatate (TESSALON) 100 MG capsule Take 1 capsule (100 mg total) by mouth 2 (two) times daily as needed for cough.   donepezil (ARICEPT) 5 MG tablet TAKE 1 TABLET BY MOUTH AT  BEDTIME   flecainide (TAMBOCOR) 50 MG tablet Take 1 tablet (50 mg total) by mouth 2 (two) times daily.   furosemide (LASIX) 20 MG tablet Take 1 tablet (20 mg total) by mouth every other day.   levothyroxine (SYNTHROID) 88 MCG tablet TAKE 1 TABLET BY MOUTH DAILY   metoprolol succinate (TOPROL-XL) 50 MG 24 hr tablet TAKE 1 TABLETS BY MOUTH DAILY. TAKE WITHOR IMMEDIATELY FOLLOWING A MEAL.   [DISCONTINUED] cyclobenzaprine (FLEXERIL) 5 MG tablet TAKE 1 TABLET BY MOUTH AT BEDTIME   [DISCONTINUED] empagliflozin (JARDIANCE) 10 MG TABS tablet Take 1 tablet (10 mg total) by mouth daily before breakfast.   [DISCONTINUED] gabapentin (NEURONTIN) 100 MG capsule TAKE 1 CAPSULE BY MOUTH 3 TIMES DAILY   [DISCONTINUED] venlafaxine XR (EFFEXOR-XR) 75 MG 24 hr capsule TAKE 2 CAPSULES BY MOUTH DAILY   [DISCONTINUED] Vitamin D, Ergocalciferol, (DRISDOL) 1.25 MG (50000 UNIT) CAPS capsule TAKE 1 CAPSULE BY MOUTH EVERY FRIDAY   Vitamin D, Ergocalciferol, (DRISDOL) 1.25 MG (50000 UNIT) CAPS capsule Take 1 capsule by mouth weekly   No facility-administered encounter medications on file as of 08/27/2022.    Surgical History: Past Surgical History:  Procedure Laterality Date   CARDIAC CATHETERIZATION  ~ 2007   EXCISIONAL HEMORRHOIDECTOMY  1960's   EYE SURGERY Bilateral    Cataract extraction with IOL   KNEE ARTHROSCOPY Left X 2 <2008   KNEE ARTHROSCOPY WITH SUBCHONDROPLASTY Right 04/19/2016   Procedure: KNEE ARTHROSCOPY WITH SUBCHONDROPLASTY, PARTIAL MENISCECTOMY;  Surgeon: Hessie Knows, MD;  Location: [facility];  Service: Orthopedics;  Laterality: Right;   LOOP RECORDER INSERTION N/A 01/02/2018   Procedure: LOOP RECORDER INSERTION;  Surgeon: Deboraha Sprang, MD;  Location: [facility];  Service: Cardiovascular;  Laterality: N/A;   LUMBAR LAMINECTOMY/DECOMPRESSION MICRODISCECTOMY N/A 02/06/2021   Procedure: Lumbar Three-Four, Lumbar Four-Five Laminectomy and Foraminotomy;  Surgeon: Kristeen Miss, MD;  Location: [facility];  Service: Neurosurgery;  Laterality: N/A;   SHOULDER OPEN ROTATOR CUFF REPAIR Right 2001   TONSILLECTOMY  ~ [place] ARTHROPLASTY Left 2008   TOTAL KNEE ARTHROPLASTY Right 07/03/2016   Procedure: TOTAL KNEE ARTHROPLASTY;  Surgeon: Hessie Knows, MD;  Location: [facility];  Service: Orthopedics;  Laterality: Right;   TUBAL LIGATION  1973   VAGINAL HYSTERECTOMY  1974    Medical History: Past Medical History:  Diagnosis Date   Anxiety    Arthritis    "shoulders" (08/20/2014)   Asthma    Chronic lower back pain    Complication of anesthesia    "agitated & restless after knee replacement in 2008"   COPD (chronic obstructive pulmonary disease) (HCC)    DDD (degenerative disc disease), lumbar    Depression    Diabetes mellitus without complication (HCC)    Diverticulosis    Family history of adverse reaction to anesthesia    GERD (gastroesophageal reflux disease)    High cholesterol    Hypertension    Hypothyroidism    Memory loss    mild   On home oxygen therapy    "2L at night" (08/20/2014)   PAF (paroxysmal atrial fibrillation) (HCC)    Peripheral vascular disease (HCC)    Pre-diabetes    Shortness of breath dyspnea    with exertion   Stroke (HCC)    mild, no deficits    Family History: Family History  Problem Relation Age of Onset   Diabetes Mother    Hypertension Mother    Lung cancer Mother    Congestive Heart Failure Father    Congestive Heart Failure Sister     Social History   Socioeconomic History   Marital status: Divorced    Spouse name: Not on file   Number of children: Not on file   Years of education: Not on file   Highest education level: Not on file  Occupational History   Not on file  Tobacco Use   Smoking status: Every Day    Packs/day: 0.25    Years: 53.00    Total pack years: 13.25    Types: Cigarettes   Smokeless tobacco: Never   Tobacco comments:    5 cigarettes weekly  Vaping Use   Vaping Use: Never used  Substance and  Sexual Activity   Alcohol use: No   Drug use: No   Sexual activity: Not Currently  Other Topics Concern   Not on file  Social History Narrative   Not on file   Social Determinants of Health   Financial Resource Strain: Not on file  Food Insecurity: Not on file  Transportation Needs: Not on file  Physical Activity: Not on file  Stress: Not on file  Social Connections: Not on file  Intimate Partner Violence: Not on file      Review of Systems  Constitutional:  Negative for chills and unexpected weight change.  HENT:  Negative for congestion, postnasal drip, rhinorrhea, sneezing and sore throat.   Eyes:  Negative for redness.  Respiratory:  Positive for shortness  of breath. Negative for cough and chest tightness.   Cardiovascular:  Negative for chest pain and palpitations.  Gastrointestinal:  Negative for abdominal pain, constipation, diarrhea, nausea and vomiting.  Genitourinary:  Negative for dysuria and frequency.  Musculoskeletal:  Positive for arthralgias. Negative for back pain, joint swelling and neck pain.  Skin:  Negative for rash.  Neurological: Negative.  Negative for tremors and numbness.  Hematological:  Negative for adenopathy. Does not bruise/bleed easily.  Psychiatric/Behavioral:  Negative for behavioral problems (Depression), sleep disturbance and suicidal ideas. The patient is not nervous/anxious.     Vital Signs: BP (!) 120/50   Pulse 67   Temp (!) 97.1 F (36.2 C)   Resp 16   Ht '5\' 4"'$  (1.626 m)   Wt 210 lb 12.8 oz (95.6 kg)   SpO2 97% Comment: 2L  BMI 36.18 kg/m    Physical Exam Vitals and nursing note reviewed.  Constitutional:      General: She is not in acute distress.    Appearance: She is well-developed. She is obese. She is not diaphoretic.  HENT:     Head: Normocephalic and atraumatic.     Mouth/Throat:     Pharynx: No oropharyngeal exudate.  Eyes:     Pupils: Pupils are equal, round, and reactive to light.  Neck:     Thyroid: No  thyromegaly.     Vascular: No JVD.     Trachea: No tracheal deviation.  Cardiovascular:     Rate and Rhythm: Normal rate and regular rhythm.     Heart sounds: Normal heart sounds. No murmur heard.    No friction rub. No gallop.  Pulmonary:     Effort: Pulmonary effort is normal.  Abdominal:     General: Bowel sounds are normal.     Palpations: Abdomen is soft.  Musculoskeletal:        General: Normal range of motion.     Cervical back: Normal range of motion and neck supple.  Lymphadenopathy:     Cervical: No cervical adenopathy.  Skin:    General: Skin is warm and dry.  Neurological:     Mental Status: She is alert and oriented to person, place, and time.     Cranial Nerves: No cranial nerve deficit.  Psychiatric:        Behavior: Behavior normal.        Thought Content: Thought content normal.        Judgment: Judgment normal.       Assessment/Plan: 1. Encounter for examination following treatment at hospital Reviewed hospital course  2. SOB (shortness of breath) Continue on oxygen and inhaler as prescribed and follow up with pulmonology  3. Dependence on supplemental oxygen Continue oxygen  4. Pneumonia due to infectious organism, unspecified laterality, unspecified part of lung Improving, continue oxygen   5. Diabetes mellitus without complication ([place]) - POCT glycosylated hemoglobin (Hb A1C) is 6.5 which is stable from previous. Continue current medication  6. Vitamin D deficiency - Vitamin D, Ergocalciferol, (DRISDOL) 1.25 MG (50000 UNIT) CAPS capsule; Take 1 capsule by mouth weekly  Dispense: 12 capsule; Refill: 3  7. PAF (paroxysmal atrial fibrillation) ([place]) Followed by cardiology   General Counseling: Sandy Salaam understanding of the findings of todays visit and agrees with plan of treatment. I have discussed any further diagnostic evaluation that may be needed or ordered today. We also reviewed her medications today. she has been encouraged to  call the office with any questions or concerns that should arise  related to todays visit.    Counseling:    Orders Placed This Encounter  Procedures   POCT glycosylated hemoglobin (Hb A1C)    This patient was seen by Drema Dallas, PA-C in collaboration with Dr. Clayborn Bigness as a part of collaborative care agreement.   I have reviewed all medical records from hospital follow up including radiology reports and consults from other physicians. Appropriate follow up diagnostics will be scheduled as needed. Patient/ Family understands the plan of treatment. Time spent 40 minutes.   Dr Lavera Guise, MD Internal Medicine

## 2022-09-04 ENCOUNTER — Encounter: Payer: Medicare Other | Admitting: Internal Medicine

## 2022-09-05 ENCOUNTER — Other Ambulatory Visit: Payer: Self-pay | Admitting: Physician Assistant

## 2022-09-05 DIAGNOSIS — F411 Generalized anxiety disorder: Secondary | ICD-10-CM

## 2022-09-05 DIAGNOSIS — R5383 Other fatigue: Secondary | ICD-10-CM

## 2022-09-05 DIAGNOSIS — E119 Type 2 diabetes mellitus without complications: Secondary | ICD-10-CM

## 2022-09-06 NOTE — Telephone Encounter (Signed)
Thanks SK

## 2022-09-11 ENCOUNTER — Telehealth: Payer: Self-pay

## 2022-09-11 ENCOUNTER — Ambulatory Visit (INDEPENDENT_AMBULATORY_CARE_PROVIDER_SITE_OTHER): Payer: Medicare Other | Admitting: Internal Medicine

## 2022-09-11 ENCOUNTER — Encounter: Payer: Self-pay | Admitting: Internal Medicine

## 2022-09-11 VITALS — BP 149/68 | HR 71 | Temp 98.3°F | Resp 16 | Ht 64.0 in | Wt 206.8 lb

## 2022-09-11 DIAGNOSIS — I639 Cerebral infarction, unspecified: Secondary | ICD-10-CM

## 2022-09-11 DIAGNOSIS — J449 Chronic obstructive pulmonary disease, unspecified: Secondary | ICD-10-CM | POA: Diagnosis not present

## 2022-09-11 DIAGNOSIS — Z9981 Dependence on supplemental oxygen: Secondary | ICD-10-CM | POA: Diagnosis not present

## 2022-09-11 DIAGNOSIS — I480 Paroxysmal atrial fibrillation: Secondary | ICD-10-CM

## 2022-09-11 DIAGNOSIS — R0602 Shortness of breath: Secondary | ICD-10-CM | POA: Diagnosis not present

## 2022-09-11 DIAGNOSIS — J849 Interstitial pulmonary disease, unspecified: Secondary | ICD-10-CM | POA: Diagnosis not present

## 2022-09-11 NOTE — Progress Notes (Signed)
[facility] [place], [place] [address]  Pulmonary Sleep Medicine   Office Visit Note  Patient Name: [name] Douglas DOB: 1941/06/18 MRN [id number]  Date of Service: 09/11/2022  Complaints/HPI: She was admitted to the hospital back in [place]. Patient states she does have SOB when she exerts. Her daughter states that she has been easily desaturation while even sitting. Patient is visibly winded. No sign of active chest pain. She was sent home with oxygen from the hospital and her family has noted that she feels better  ROS  General: (-) fever, (-) chills, (-) night sweats, (-) weakness Skin: (-) rashes, (-) itching,. Eyes: (-) visual changes, (-) redness, (-) itching. Nose and Sinuses: (-) nasal stuffiness or itchiness, (-) postnasal drip, (-) nosebleeds, (-) sinus trouble. Mouth and Throat: (-) sore throat, (-) hoarseness. Neck: (-) swollen glands, (-) enlarged thyroid, (-) neck pain. Respiratory: - cough, (-) bloody sputum, + shortness of breath, - wheezing. Cardiovascular: - ankle swelling, (-) chest pain. Lymphatic: (-) lymph node enlargement. Neurologic: (-) numbness, (-) tingling. Psychiatric: (-) anxiety, (-) depression   Current Medication: Outpatient Encounter Medications as of 09/11/2022  Medication Sig   amLODipine (NORVASC) 5 MG tablet TAKE 1 TABLET BY MOUTH DAILY   apixaban (ELIQUIS) 5 MG TABS tablet Take 1 tablet (5 mg total) by mouth 2 (two) times daily.   atorvastatin (LIPITOR) 80 MG tablet TAKE 1 TABLET BY MOUTH DAILY AT 6PM   benzonatate (TESSALON) 100 MG capsule Take 1 capsule (100 mg total) by mouth 2 (two) times daily as needed for cough.   donepezil (ARICEPT) 5 MG tablet TAKE 1 TABLET BY MOUTH AT BEDTIME   flecainide (TAMBOCOR) 50 MG tablet Take 1 tablet (50 mg total) by mouth 2 (two) times daily.   furosemide (LASIX) 20 MG tablet Take 1 tablet (20 mg total) by mouth every other day.   JARDIANCE 10 MG TABS tablet TAKE 1 TABLET BY  MOUTH DAILY BEFORE BREAKFAST   levothyroxine (SYNTHROID) 88 MCG tablet TAKE 1 TABLET BY MOUTH DAILY   metoprolol succinate (TOPROL-XL) 50 MG 24 hr tablet TAKE 1 TABLETS BY MOUTH DAILY. TAKE WITHOR IMMEDIATELY FOLLOWING A MEAL.   venlafaxine XR (EFFEXOR-XR) 75 MG 24 hr capsule TAKE 2 CAPSULES BY MOUTH DAILY   Vitamin D, Ergocalciferol, (DRISDOL) 1.25 MG (50000 UNIT) CAPS capsule Take 1 capsule by mouth weekly   No facility-administered encounter medications on file as of 09/11/2022.    Surgical History: Past Surgical History:  Procedure Laterality Date   CARDIAC CATHETERIZATION  ~ 2007   EXCISIONAL HEMORRHOIDECTOMY  1960's   EYE SURGERY Bilateral    Cataract extraction with IOL   KNEE ARTHROSCOPY Left X 2 <2008   KNEE ARTHROSCOPY WITH SUBCHONDROPLASTY Right 04/19/2016   Procedure: KNEE ARTHROSCOPY WITH SUBCHONDROPLASTY, PARTIAL MENISCECTOMY;  Surgeon: Hessie Knows, MD;  Location: [facility];  Service: Orthopedics;  Laterality: Right;   LOOP RECORDER INSERTION N/A 01/02/2018   Procedure: LOOP RECORDER INSERTION;  Surgeon: Deboraha Sprang, MD;  Location: [facility];  Service: Cardiovascular;  Laterality: N/A;   LUMBAR LAMINECTOMY/DECOMPRESSION MICRODISCECTOMY N/A 02/06/2021   Procedure: Lumbar Three-Four, Lumbar Four-Five Laminectomy and Foraminotomy;  Surgeon: Kristeen Miss, MD;  Location: [facility];  Service: Neurosurgery;  Laterality: N/A;   SHOULDER OPEN ROTATOR CUFF REPAIR Right 2001   TONSILLECTOMY  ~ [place] ARTHROPLASTY Left 2008   TOTAL KNEE ARTHROPLASTY Right 07/03/2016   Procedure: TOTAL KNEE ARTHROPLASTY;  Surgeon: Hessie Knows, MD;  Location: [facility];  Service: Orthopedics;  Laterality: Right;   TUBAL LIGATION  1973   VAGINAL HYSTERECTOMY  1974    Medical History: Past Medical History:  Diagnosis Date   Anxiety    Arthritis    "shoulders" (08/20/2014)   Asthma    Chronic lower back pain    Complication of anesthesia    "agitated & restless after knee  replacement in 2008"   COPD (chronic obstructive pulmonary disease) (HCC)    DDD (degenerative disc disease), lumbar    Depression    Diabetes mellitus without complication (HCC)    Diverticulosis    Family history of adverse reaction to anesthesia    GERD (gastroesophageal reflux disease)    High cholesterol    Hypertension    Hypothyroidism    Memory loss    mild   On home oxygen therapy    "2L at night" (08/20/2014)   PAF (paroxysmal atrial fibrillation) (HCC)    Peripheral vascular disease (HCC)    Pre-diabetes    Shortness of breath dyspnea    with exertion   Stroke (HCC)    mild, no deficits    Family History: Family History  Problem Relation Age of Onset   Diabetes Mother    Hypertension Mother    Lung cancer Mother    Congestive Heart Failure Father    Congestive Heart Failure Sister     Social History: Social History   Socioeconomic History   Marital status: Divorced    Spouse name: Not on file   Number of children: Not on file   Years of education: Not on file   Highest education level: Not on file  Occupational History   Not on file  Tobacco Use   Smoking status: Every Day    Packs/day: 0.25    Years: 53.00    Total pack years: 13.25    Types: Cigarettes   Smokeless tobacco: Never   Tobacco comments:    5 cigarettes weekly  Vaping Use   Vaping Use: Never used  Substance and Sexual Activity   Alcohol use: No   Drug use: No   Sexual activity: Not Currently  Other Topics Concern   Not on file  Social History Narrative   Not on file   Social Determinants of Health   Financial Resource Strain: Not on file  Food Insecurity: Not on file  Transportation Needs: Not on file  Physical Activity: Not on file  Stress: Not on file  Social Connections: Not on file  Intimate Partner Violence: Not on file    Vital Signs: Blood pressure (!) 149/68, pulse 71, temperature 98.3 F (36.8 C), resp. rate 16, height '5\' 4"'$  (1.626 m), weight 206 lb 12.8 oz  (93.8 kg), SpO2 99 %.  Examination: General Appearance: The patient is well-developed, well-nourished, and in no distress. Skin: Gross inspection of skin unremarkable. Head: normocephalic, no gross deformities. Eyes: no gross deformities noted. ENT: ears appear grossly normal no exudates. Neck: Supple. No thyromegaly. No LAD. Respiratory: no rhonchi noted. Cardiovascular: Normal S1 and S2 without murmur or rub. Extremities: No cyanosis. pulses are equal. Neurologic: Alert and oriented. No involuntary movements.  LABS: Recent Results (from the past 2160 hour(s))  Resp panel by RT-PCR (RSV, Flu A&B, Covid) Anterior Nasal Swab     Status: None   Collection Time: 08/07/22  7:50 PM   Specimen: Anterior Nasal Swab  Result Value Ref Range   SARS Coronavirus 2 by RT PCR NEGATIVE NEGATIVE  Comment: (NOTE) SARS-CoV-2 target nucleic acids are NOT DETECTED.  The SARS-CoV-2 RNA is generally detectable in upper respiratory specimens during the acute phase of infection. The lowest concentration of SARS-CoV-2 viral copies this assay can detect is 138 copies/mL. A negative result does not preclude SARS-Cov-2 infection and should not be used as the sole basis for treatment or other patient management decisions. A negative result may occur with  improper specimen collection/handling, submission of specimen other than nasopharyngeal swab, presence of viral mutation(s) within the areas targeted by this assay, and inadequate number of viral copies(<138 copies/mL). A negative result must be combined with clinical observations, patient history, and epidemiological information. The expected result is Negative.  Fact Sheet for Patients:  EntrepreneurPulse.com.au  Fact Sheet for Healthcare Providers:  IncredibleEmployment.be  This test is no t yet approved or cleared by the [place] FDA and  has been authorized for detection and/or diagnosis of SARS-CoV-2  by FDA under an Emergency Use Authorization (EUA). This EUA will remain  in effect (meaning this test can be used) for the duration of the COVID-19 declaration under Section 564(b)(1) of the Act, 21 U.S.C.section 360bbb-3(b)(1), unless the authorization is terminated  or revoked sooner.       Influenza A by PCR NEGATIVE NEGATIVE   Influenza B by PCR NEGATIVE NEGATIVE    Comment: (NOTE) The Xpert Xpress SARS-CoV-2/FLU/RSV plus assay is intended as an aid in the diagnosis of influenza from Nasopharyngeal swab specimens and should not be used as a sole basis for treatment. Nasal washings and aspirates are unacceptable for Xpert Xpress SARS-CoV-2/FLU/RSV testing.  Fact Sheet for Patients: EntrepreneurPulse.com.au  Fact Sheet for Healthcare Providers: IncredibleEmployment.be  This test is not yet approved or cleared by the [place] FDA and has been authorized for detection and/or diagnosis of SARS-CoV-2 by FDA under an Emergency Use Authorization (EUA). This EUA will remain in effect (meaning this test can be used) for the duration of the COVID-19 declaration under Section 564(b)(1) of the Act, 21 U.S.C. section 360bbb-3(b)(1), unless the authorization is terminated or revoked.     Resp Syncytial Virus by PCR NEGATIVE NEGATIVE    Comment: (NOTE) Fact Sheet for Patients: EntrepreneurPulse.com.au  Fact Sheet for Healthcare Providers: IncredibleEmployment.be  This test is not yet approved or cleared by the [place] FDA and has been authorized for detection and/or diagnosis of SARS-CoV-2 by FDA under an Emergency Use Authorization (EUA). This EUA will remain in effect (meaning this test can be used) for the duration of the COVID-19 declaration under Section 564(b)(1) of the Act, 21 U.S.C. section 360bbb-3(b)(1), unless the authorization is terminated or revoked.  Performed at [facility], [place] [address], [place], [place] [address]   POCT glycosylated hemoglobin (Hb A1C)     Status: Abnormal   Collection Time: 08/27/22  1:48 PM  Result Value Ref Range   Hemoglobin A1C 6.5 (A) 4.0 - 5.6 %   HbA1c POC (<> result, manual entry)     HbA1c, POC (prediabetic range)     HbA1c, POC (controlled diabetic range)      Radiology: DG Chest 2 View  Result Date: 08/07/2022 CLINICAL DATA:  Shortness of breath EXAM: CHEST - 2 VIEW COMPARISON:  Chest x-ray 05/02/2021, CT 10/17/2016 FINDINGS: Emphysema and chronic bilateral reticular opacity. Possible focal airspace disease posteriorly on lateral view overlying the spine. Stable borderline cardiomegaly with aortic atherosclerosis. No pneumothorax. IMPRESSION: Emphysema and chronic lung disease. Possible focal airspace disease/pneumonia posteriorly on lateral view. Short interval  radiographic follow-up is recommended. Electronically Signed   By: Donavan Foil M.D.   On: 08/07/2022 20:17    No results found.  No results found.    Assessment and Plan: Patient Active Problem List   Diagnosis Date Noted   History of loop recorder 03/02/2022   Palpitations    Elevated troponin    SVT (supraventricular tachycardia)    PAF (paroxysmal atrial fibrillation) ([place])    Chest pain 05/02/2021   Lumbar stenosis with neurogenic claudication 02/06/2021   Compression fracture of fifth lumbar vertebra ([place]) 12/20/2020   DDD (degenerative disc disease), thoracolumbar 11/22/2020   DDD (degenerative disc disease), thoracic 11/10/2020   TIA (transient ischemic attack) 10/12/2020   Left shoulder pain 10/12/2020   Chronic thoracic back pain (Left) 10/10/2020   T12 compression fracture, sequela 10/10/2020   Lumbar foraminal stenosis (Bilateral: L2-3, L3-4, L4-5) (Right: L5-S1) (Severe left L4-5) 09/08/2020   Grade 1 Lumbar Anterolisthesis (L3/4 and L4/5) 09/08/2020   Grade 1 Lumbar Retrolisthesis (L2/3) 09/08/2020   Protrusion of lumbar intervertebral  disc (Multilevel) 09/08/2020   Compression fracture of L1 lumbar vertebra, sequela 09/08/2020   Other intervertebral disc degeneration, lumbar region 09/08/2020   Lumbosacral facet syndrome (Multilevel) (Bilateral) 09/08/2020   Lumbar facet hypertrophy (Multilevel) (Bilateral) 09/08/2020   Ligamentum flavum hypertrophy 09/08/2020   Lumbar lateral recess stenosis (Right: L2-3) 09/08/2020   DDD (degenerative disc disease), lumbosacral 09/08/2020   Compression fracture of L5 vertebra ([place]) 09/08/2020   Osteoporosis with pathological fracture of lumbar vertebra ([place]) 09/08/2020   Vitamin B12 deficiency 09/08/2020   Chronic pain syndrome 09/07/2020   Pharmacologic therapy 09/07/2020   Disorder of skeletal system 09/07/2020   Problems influencing health status 72/53/6644   Uncomplicated opioid dependence ([place]) 09/07/2020   Chronic low back pain (2ry area of Pain) (Bilateral) (R>L) w/ sciatica (Right) 09/07/2020   Chronic lower extremity pain (1ry area of Pain) (Right) 09/07/2020   Chronic lumbar radiculopathy (Right) 09/07/2020   Lumbosacral radiculopathy at L2 (Right) 09/07/2020   Lumbar radiculitis (L2 dermatome) (Right) 09/07/2020   Unspecified inflammatory spondylopathy, lumbar region ([place]) 09/07/2020   Chronic anticoagulation ([place]) 09/07/2020   Abnormal MRI, lumbar spine (04/20/2020) 09/07/2020   Intractable low back pain 08/03/2020   Fall down stairs 08/03/2020   Hemorrhagic stroke ([place]) 08/03/2020   Dizziness and giddiness 07/26/2020   Other chronic pain 07/26/2020   Other sequelae of cerebral infarction 07/18/2020   CVA (cerebral vascular accident) ([place]) 07/14/2020   Blurred vision 07/14/2020   Lumbar central spinal stenosis (Moderate: L2-3  Severe: L3-4, L4-5) w/ neurogenic claudication 05/31/2020   Muscle spasm 04/10/2020   Urinary tract infection with hematuria 04/10/2020   Headache disorder 12/21/2019   History of stroke 12/21/2019   Dizziness 11/06/2019   Atopic  dermatitis 10/28/2019   History of CVA (cerebrovascular accident) 10/07/2019   Intractable episodic cluster headache 09/20/2019   Other symptoms and signs involving the nervous system 09/20/2019   Anxiety disorder, unspecified 08/21/2019   Dependence on supplemental oxygen 08/21/2019   Presence of artificial knee joint, bilateral 08/21/2019   Nicotine dependence, cigarettes, uncomplicated 03/47/4259   Iron deficiency anemia 05/17/2019   Vitamin D deficiency 05/17/2019   Flu vaccine need 05/17/2019   Encounter for general adult medical examination with abnormal findings 04/05/2019   Nausea and vomiting 04/05/2019   Dysuria 04/05/2019   Need for vaccination against Streptococcus pneumoniae using pneumococcal conjugate vaccine 7 04/05/2019   Occlusion and stenosis of bilateral carotid arteries 11/24/2017   Subarachnoid hemorrhage ([place]) 12/09/2016  Solitary pulmonary nodule 08/24/2016   Primary localized osteoarthritis of right knee 07/03/2016   Ileus ([place]) 04/24/2016   Colitis due to Clostridium difficile    Recurrent Clostridium difficile diarrhea    Enteritis due to Clostridium difficile 08/22/2014   Diarrhea 08/20/2014   Essential hypertension 08/20/2014   Hyperlipidemia 08/20/2014   Hypothyroidism 08/20/2014   Tobacco abuse 08/20/2014   COPD (chronic obstructive pulmonary disease) ([place]) 08/20/2014   Acute diarrhea 07/27/2014    1. Chronic obstructive pulmonary disease, unspecified COPD type ([place]) Will get follow-up pulmonary functions ordered patient has significant COPD as far as treatment is concerned at this time patient is on no inhalers - Pulmonary function test; Future  2. PAF (paroxysmal atrial fibrillation) (HCC) Rate is controlled at this time we will continue with current management  3. Dependence on supplemental oxygen Use 2lpm at rest upto 4lpm - For home use only DME oxygen  4. ILD (interstitial lung disease) ([place]) Will do follow-up CT scan of the chest to  see if there is been any progression of the interstitial lung disease - CT Chest High Resolution; Future   General Counseling: I have discussed the findings of the evaluation and examination with Vaughan Basta.  I have also discussed any further diagnostic evaluation thatmay be needed or ordered today. Charlann verbalizes understanding of the findings of todays visit. We also reviewed her medications today and discussed drug interactions and side effects including but not limited excessive drowsiness and altered mental states. We also discussed that there is always a risk not just to her but also people around her. she has been encouraged to call the office with any questions or concerns that should arise related to todays visit.  No orders of the defined types were placed in this encounter.    Time spent: 32  I have personally obtained a history, examined the patient, evaluated laboratory and imaging results, formulated the assessment and plan and placed orders.    Allyne Gee, MD [facility] Pulmonary and Critical Care Sleep medicine

## 2022-09-11 NOTE — Telephone Encounter (Signed)
Spoke with kelly from [place] [facility] that pt need ASAP oxygen they like to switch company

## 2022-09-11 NOTE — Patient Instructions (Signed)
Chronic Obstructive Pulmonary Disease  Chronic obstructive pulmonary disease (COPD) is a long-term (chronic) lung problem. When you have COPD, it is hard for air to get in and out of your lungs. Usually the condition gets worse over time, and your lungs will never return to normal. There are things you can do to keep yourself as healthy as possible. What are the causes? Smoking. This is the most common cause. Certain genes passed from parent to child (inherited). What increases the risk? Being exposed to secondhand smoke from cigarettes, pipes, or cigars. Being exposed to chemicals and other irritants, such as fumes and dust in the work environment. Having chronic lung conditions or infections. What are the signs or symptoms? Shortness of breath, especially during physical activity. A long-term cough with a large amount of thick mucus. Sometimes, the cough may not have any mucus (dry cough). Wheezing. Breathing quickly. Skin that looks gray or blue, especially in the fingers, toes, or lips. Feeling tired (fatigue). Weight loss. Chest tightness. Having infections often. Episodes when breathing symptoms become much worse (exacerbations). At the later stages of this disease, you may have swelling in the ankles, feet, or legs. How is this treated? Taking medicines. Quitting smoking, if you smoke. Rehabilitation. This includes steps to make your body work better. It may involve a team of specialists. Doing exercises. Making changes to your diet. Using oxygen. Lung surgery. Lung transplant. Comfort measures (palliative care). Follow these instructions at home: Medicines Take over-the-counter and prescription medicines only as told by your doctor. Talk to your doctor before taking any cough or allergy medicines. You may need to avoid medicines that cause your lungs to be dry. Lifestyle If you smoke, stop smoking. Smoking makes the problem worse. Do not smoke or use any products that  contain nicotine or tobacco. If you need help quitting, ask your doctor. Avoid being around things that make your breathing worse. This may include smoke, chemicals, and fumes. Stay active, but remember to rest as well. Learn and use tips on how to manage stress and control your breathing. Make sure you get enough sleep. Most adults need at least 7 hours of sleep every night. Eat healthy foods. Eat smaller meals more often. Rest before meals. Controlled breathing Learn and use tips on how to control your breathing as told by your doctor. Try: Breathing in (inhaling) through your nose for 1 second. Then, pucker your lips and breath out (exhale) through your lips for 2 seconds. Putting one hand on your belly (abdomen). Breathe in slowly through your nose for 1 second. Your hand on your belly should move out. Pucker your lips and breathe out slowly through your lips. Your hand on your belly should move in as you breathe out.  Controlled coughing Learn and use controlled coughing to clear mucus from your lungs. Follow these steps: Lean your head a little forward. Breathe in deeply. Try to hold your breath for 3 seconds. Keep your mouth slightly open while coughing 2 times. Spit any mucus out into a tissue. Rest and do the steps again 1 or 2 times as needed. General instructions Make sure you get all the shots (vaccines) that your doctor recommends. Ask your doctor about a flu shot and a pneumonia shot. Use oxygen therapy and pulmonary rehabilitation if told by your doctor. If you need home oxygen therapy, ask your doctor if you should buy a tool to measure your oxygen level (oximeter). Make a COPD action plan with your doctor. This helps you   to know what to do if you feel worse than usual. Manage any other conditions you have as told by your doctor. Avoid going outside when it is very hot, cold, or humid. Avoid people who have a sickness you can catch (contagious). Keep all follow-up  visits. Contact a doctor if: You cough up more mucus than usual. There is a change in the color or thickness of the mucus. It is harder to breathe than usual. Your breathing is faster than usual. You have trouble sleeping. You need to use your medicines more often than usual. You have trouble doing your normal activities such as getting dressed or walking around the house. Get help right away if: You have shortness of breath while resting. You have shortness of breath that stops you from: Being able to talk. Doing normal activities. Your chest hurts for longer than 5 minutes. Your skin color is more blue than usual. Your pulse oximeter shows that you have low oxygen for longer than 5 minutes. You have a fever. You feel too tired to breathe normally. These symptoms may represent a serious problem that is an emergency. Do not wait to see if the symptoms will go away. Get medical help right away. Call your local emergency services (911 in the [place] Do not drive yourself to the hospital. Summary Chronic obstructive pulmonary disease (COPD) is a long-term lung problem. The way your lungs work will never return to normal. Usually the condition gets worse over time. There are things you can do to keep yourself as healthy as possible. Take over-the-counter and prescription medicines only as told by your doctor. If you smoke, stop. Smoking makes the problem worse. This information is not intended to replace advice given to you by your health care provider. Make sure you discuss any questions you have with your health care provider. Document Revised: 06/14/2020 Document Reviewed: 06/14/2020 Elsevier Patient Education  2023 Elsevier Inc.  

## 2022-09-12 ENCOUNTER — Telehealth: Payer: Self-pay | Admitting: Internal Medicine

## 2022-09-12 NOTE — Telephone Encounter (Signed)
Lvm with daughter, Larene Beach, notifying her of CT appointment date, time & location-[place]

## 2022-09-17 ENCOUNTER — Other Ambulatory Visit
Admission: RE | Admit: 2022-09-17 | Discharge: 2022-09-17 | Disposition: A | Discharge status: Home / Self Care | Payer: Medicare Other | Attending: Internal Medicine | Admitting: Internal Medicine

## 2022-09-17 ENCOUNTER — Ambulatory Visit: Payer: Medicare Other | Attending: Internal Medicine | Admitting: Internal Medicine

## 2022-09-17 ENCOUNTER — Encounter: Payer: Self-pay | Admitting: Internal Medicine

## 2022-09-17 VITALS — BP 120/58 | HR 61 | Ht 61.0 in | Wt 206.4 lb

## 2022-09-17 DIAGNOSIS — E039 Hypothyroidism, unspecified: Secondary | ICD-10-CM | POA: Diagnosis not present

## 2022-09-17 DIAGNOSIS — I480 Paroxysmal atrial fibrillation: Secondary | ICD-10-CM | POA: Diagnosis not present

## 2022-09-17 DIAGNOSIS — Z959 Presence of cardiac and vascular implant and graft, unspecified: Secondary | ICD-10-CM | POA: Diagnosis not present

## 2022-09-17 DIAGNOSIS — I639 Cerebral infarction, unspecified: Secondary | ICD-10-CM | POA: Insufficient documentation

## 2022-09-17 LAB — TSH: TSH: 2.177 u[IU]/mL (ref 0.350–4.500)

## 2022-09-17 NOTE — Progress Notes (Signed)
Patient Care Team: Lavera Guise, MD as PCP - General (Internal Medicine)   HPI  [name] Douglas is a 82 y.o. female Seen in followup for a loop recorder implanted for Cryptogenic Stroke 5/19 explanted 1/23  Also with adenosine responsive SVT EMS record is not available, but reportedly "heart rates in the 170s.  Was given a 6 mg of adenosine with conversion to sinus rhythm.  "   3/21 A-fib was detected on her monitor.  Antiplatelet therapy was subsequently discontinued (1/22) and anticoagulation with Eliquis was initiated.  No clinical bleeding but has been noted to be iron deficient Interval hospitalization at [facility] for pneumonia with a positive screen for rhinovirus treated with prednisone and nebulizers.  Anemia was reevaluated and concerning for iron deficiency  but no therapy was initiated.  She ended up on home oxygen  No chest pain but dyspnea.  No edema.     DATE TEST EF   5/19 Echo   55-60 %   11/21 Echo   55-60 % LA normal   2/22 Echo  55-65%   1.23 Myoview  No ischemia  1/23 Echo  55-65%    Date Cr K Hgb TSH  12/21 0.85 3.8 10.6<,11.7    9/22 0.77 4.6 11.3  1.48<<15.5  7/23 1.17 4.2 11.9       Thromboembolic risk factors ( age  -2, HTN-1, TIA/CVA-2, Gender-1) for a CHADSVASc Score of >=6   Records and Results Reviewed   Past Medical History:  Diagnosis Date   Anxiety    Arthritis    "shoulders" (08/20/2014)   Asthma    Chronic lower back pain    Complication of anesthesia    "agitated & restless after knee replacement in 2008"   COPD (chronic obstructive pulmonary disease) ([place])    DDD (degenerative disc disease), lumbar    Depression    Diabetes mellitus without complication ([place])    Diverticulosis    Family history of adverse reaction to anesthesia    GERD (gastroesophageal reflux disease)    High cholesterol    Hypertension    Hypothyroidism    Memory loss    mild   On home oxygen therapy    "2L at night" (08/20/2014)   PAF (paroxysmal  atrial fibrillation) (HCC)    Peripheral vascular disease (HCC)    Pre-diabetes    Shortness of breath dyspnea    with exertion   Stroke ([place])    mild, no deficits    Past Surgical History:  Procedure Laterality Date   CARDIAC CATHETERIZATION  ~ 2007   EXCISIONAL HEMORRHOIDECTOMY  1960's   EYE SURGERY Bilateral    Cataract extraction with IOL   KNEE ARTHROSCOPY Left X 2 <2008   KNEE ARTHROSCOPY WITH SUBCHONDROPLASTY Right 04/19/2016   Procedure: KNEE ARTHROSCOPY WITH SUBCHONDROPLASTY, PARTIAL MENISCECTOMY;  Surgeon: Hessie Knows, MD;  Location: [facility] ORS;  Service: Orthopedics;  Laterality: Right;   LOOP RECORDER INSERTION N/A 01/02/2018   Procedure: LOOP RECORDER INSERTION;  Surgeon: Deboraha Sprang, MD;  Location: [place] [facility];  Service: Cardiovascular;  Laterality: N/A;   LUMBAR LAMINECTOMY/DECOMPRESSION MICRODISCECTOMY N/A 02/06/2021   Procedure: Lumbar Three-Four, Lumbar Four-Five Laminectomy and Foraminotomy;  Surgeon: Kristeen Miss, MD;  Location: [place];  Service: Neurosurgery;  Laterality: N/A;   SHOULDER OPEN ROTATOR CUFF REPAIR Right 2001   TONSILLECTOMY  ~ [place] ARTHROPLASTY Left 2008   TOTAL KNEE ARTHROPLASTY Right 07/03/2016   Procedure: TOTAL KNEE  ARTHROPLASTY;  Surgeon: Hessie Knows, MD;  Location: [facility];  Service: Orthopedics;  Laterality: Right;   TUBAL LIGATION  1973   VAGINAL HYSTERECTOMY  1974    Current Meds  Medication Sig   amLODipine (NORVASC) 5 MG tablet TAKE 1 TABLET BY MOUTH DAILY   apixaban (ELIQUIS) 5 MG TABS tablet Take 1 tablet (5 mg total) by mouth 2 (two) times daily.   atorvastatin (LIPITOR) 80 MG tablet TAKE 1 TABLET BY MOUTH DAILY AT 6PM   benzonatate (TESSALON) 100 MG capsule Take 1 capsule (100 mg total) by mouth 2 (two) times daily as needed for cough.   donepezil (ARICEPT) 5 MG tablet TAKE 1 TABLET BY MOUTH AT BEDTIME   flecainide (TAMBOCOR) 50 MG tablet Take 1 tablet (50 mg total) by mouth 2 (two) times daily.    furosemide (LASIX) 20 MG tablet Take 1 tablet (20 mg total) by mouth every other day.   JARDIANCE 10 MG TABS tablet TAKE 1 TABLET BY MOUTH DAILY BEFORE BREAKFAST   levothyroxine (SYNTHROID) 88 MCG tablet TAKE 1 TABLET BY MOUTH DAILY   metoprolol succinate (TOPROL-XL) 50 MG 24 hr tablet TAKE 1 TABLETS BY MOUTH DAILY. TAKE WITHOR IMMEDIATELY FOLLOWING A MEAL.   venlafaxine XR (EFFEXOR-XR) 75 MG 24 hr capsule TAKE 2 CAPSULES BY MOUTH DAILY   Vitamin D, Ergocalciferol, (DRISDOL) 1.25 MG (50000 UNIT) CAPS capsule Take 1 capsule by mouth weekly    Allergies  Allergen Reactions   Heparin Nausea And Vomiting      Review of Systems negative except from HPI and PMH  Physical Exam BP (!) 120/58 (BP Location: Left Arm, Patient Position: Sitting, Cuff Size: Large)   Pulse 61   Ht '5\' 1"'$  (1.549 m)   Wt 206 lb 6.4 oz (93.6 kg)   SpO2 98% Comment: 4 liters  BMI 39.00 kg/m   Well developed and nourished in no acute distress wearing O2 HENT normal Neck supple with JVP- Clear Regular rate and rhythm, no murmurs or gallops Abd-soft with active BS No Clubbing cyanosis edema Skin-warm and dry A & Oriented  Grossly normal sensory and motor function  ECG sinus at 61 Level 16/08/43   Assessment and  Plan  Atrial fib paroxysmal  Dyspnea on exertion presumed HFpEF  Hypertension  Stroke prior  Anemia   No interval atrial fibrillation of which she is aware.  Will continue flecainide 50 mg twice daily.  No significant changes in the conduction parameters on her ECG.  Blood pressure is well-controlled, continue amlodipine and metoprolol.  She is euvolemic and will continue on furosemide 20 q. OD.  Her last TSH was normal; however, there have been interval significant decrease so we will recheck it today has been about a year or more.

## 2022-09-17 NOTE — Patient Instructions (Signed)
Medication Instructions:  Your physician recommends that you continue on your current medications as directed. Please refer to the Current Medication list given to you today.  *If you need a refill on your cardiac medications before your next appointment, please call your pharmacy*   Lab Work: Your physician recommends that you get lab for: [facility]  Medical Mall Entrance at [facility] 1st desk on the right to check in (REGISTRATION)  Lab hours: Monday- Friday (7:30 am- 5:30 pm)   If you have labs (blood work) drawn today and your tests are completely normal, you will receive your results only by: MyChart Message (if you have MyChart) OR A paper copy in the mail If you have any lab test that is abnormal or we need to change your treatment, we will call you to review the results.   Testing/Procedures: None ordered   Follow-Up: At [facility], you and your health needs are our priority.  As part of our continuing mission to provide you with exceptional heart care, we have created designated Provider Care Teams.  These Care Teams include your primary Cardiologist (physician) and Advanced Practice Providers (APPs -  Physician Assistants and Nurse Practitioners) who all work together to provide you with the care you need, when you need it.  We recommend signing up for the patient portal called "MyChart".  Sign up information is provided on this After Visit Summary.  MyChart is used to connect with patients for Virtual Visits (Telemedicine).  Patients are able to view lab/test results, encounter notes, upcoming appointments, etc.  Non-urgent messages can be sent to your provider as well.   To learn more about what you can do with MyChart, go to [email or web address].    Your next appointment:   6 month(s)  Provider:   Virl Axe, MD

## 2022-09-19 ENCOUNTER — Other Ambulatory Visit: Payer: Self-pay | Admitting: Physician Assistant

## 2022-09-19 DIAGNOSIS — I10 Essential (primary) hypertension: Secondary | ICD-10-CM

## 2022-10-10 ENCOUNTER — Ambulatory Visit (INDEPENDENT_AMBULATORY_CARE_PROVIDER_SITE_OTHER): Payer: Medicare Other | Admitting: Internal Medicine

## 2022-10-10 DIAGNOSIS — J449 Chronic obstructive pulmonary disease, unspecified: Secondary | ICD-10-CM

## 2022-10-12 ENCOUNTER — Emergency Department
Admission: EM | Admit: 2022-10-12 | Discharge: 2022-10-12 | Disposition: A | Discharge status: Home / Self Care | Payer: Medicare Other | Attending: Emergency Medicine | Admitting: Emergency Medicine

## 2022-10-12 ENCOUNTER — Other Ambulatory Visit: Payer: Self-pay

## 2022-10-12 ENCOUNTER — Emergency Department: Payer: Medicare Other

## 2022-10-12 DIAGNOSIS — R55 Syncope and collapse: Secondary | ICD-10-CM

## 2022-10-12 DIAGNOSIS — I959 Hypotension, unspecified: Secondary | ICD-10-CM | POA: Diagnosis not present

## 2022-10-12 DIAGNOSIS — R262 Difficulty in walking, not elsewhere classified: Secondary | ICD-10-CM | POA: Diagnosis not present

## 2022-10-12 DIAGNOSIS — G459 Transient cerebral ischemic attack, unspecified: Secondary | ICD-10-CM | POA: Diagnosis not present

## 2022-10-12 DIAGNOSIS — R4781 Slurred speech: Secondary | ICD-10-CM | POA: Diagnosis not present

## 2022-10-12 DIAGNOSIS — R531 Weakness: Secondary | ICD-10-CM | POA: Diagnosis not present

## 2022-10-12 LAB — DIFFERENTIAL
Abs Immature Granulocytes: 0.04 10*3/uL (ref 0.00–0.07)
Basophils Absolute: 0.1 10*3/uL (ref 0.0–0.1)
Basophils Relative: 1 %
Eosinophils Absolute: 0.4 10*3/uL (ref 0.0–0.5)
Eosinophils Relative: 5 %
Immature Granulocytes: 1 %
Lymphocytes Relative: 19 %
Lymphs Abs: 1.6 10*3/uL (ref 0.7–4.0)
Monocytes Absolute: 0.7 10*3/uL (ref 0.1–1.0)
Monocytes Relative: 9 %
Neutro Abs: 5.5 10*3/uL (ref 1.7–7.7)
Neutrophils Relative %: 65 %

## 2022-10-12 LAB — COMPREHENSIVE METABOLIC PANEL
ALT: 13 U/L (ref 0–44)
AST: 21 U/L (ref 15–41)
Albumin: 3.7 g/dL (ref 3.5–5.0)
Alkaline Phosphatase: 85 U/L (ref 38–126)
Anion gap: 12 (ref 5–15)
BUN: 17 mg/dL (ref 8–23)
CO2: 26 mmol/L (ref 22–32)
Calcium: 9 mg/dL (ref 8.9–10.3)
Chloride: 99 mmol/L (ref 98–111)
Creatinine, Ser: 1.03 mg/dL — ABNORMAL HIGH (ref 0.44–1.00)
GFR, Estimated: 54 mL/min — ABNORMAL LOW (ref >60)
Glucose, Bld: 72 mg/dL (ref 70–99)
Potassium: 4 mmol/L (ref 3.5–5.1)
Sodium: 137 mmol/L (ref 135–145)
Total Bilirubin: 0.5 mg/dL (ref 0.3–1.2)
Total Protein: 6.9 g/dL (ref 6.5–8.1)

## 2022-10-12 LAB — CBC
HCT: 31.5 % — ABNORMAL LOW (ref 36.0–46.0)
Hemoglobin: 9.5 g/dL — ABNORMAL LOW (ref 12.0–15.0)
MCH: 25.3 pg — ABNORMAL LOW (ref 26.0–34.0)
MCHC: 30.2 g/dL (ref 30.0–36.0)
MCV: 84 fL (ref 80.0–100.0)
Platelets: 317 10*3/uL (ref 150–400)
RBC: 3.75 MIL/uL — ABNORMAL LOW (ref 3.87–5.11)
RDW: 15 % (ref 11.5–15.5)
WBC: 8.3 10*3/uL (ref 4.0–10.5)
nRBC: 0 % (ref 0.0–0.2)

## 2022-10-12 LAB — PROTIME-INR
INR: 1.3 — ABNORMAL HIGH (ref 0.8–1.2)
Prothrombin Time: 16.4 seconds — ABNORMAL HIGH (ref 11.4–15.2)

## 2022-10-12 LAB — APTT: aPTT: 52 seconds — ABNORMAL HIGH (ref 24–36)

## 2022-10-12 LAB — CBG MONITORING, ED: Glucose-Capillary: 94 mg/dL (ref 70–99)

## 2022-10-12 LAB — TROPONIN I (HIGH SENSITIVITY)
Troponin I (High Sensitivity): 7 ng/L (ref <18)
Troponin I (High Sensitivity): 8 ng/L (ref <18)

## 2022-10-12 NOTE — ED Triage Notes (Signed)
Pt to ED via [facility] from parking lot. Pt resides at [facility]. Pt was with family today and had near syncopal episode with slurred speech. Pt had no LOC or head trauma. Symptoms have resolved. Pt with hx of TIA and CVA. LVO 0 with Ems. Pt is on Eliquis.   EMS VS:  A&Ox4 132/86 99% on 3L [place] HR 58  NSR

## 2022-10-12 NOTE — Discharge Instructions (Signed)
Please seek medical attention for any high fevers, chest pain, shortness of breath, change in behavior, persistent vomiting, bloody stool or any other new or concerning symptoms.  

## 2022-10-12 NOTE — ED Provider Notes (Signed)
[facility] Provider Note    Event Date/Time   First MD Initiated Contact with Patient 10/12/22 2045     (approximate)   History   Transient Ischemic Attack   HPI  [name] Douglas is a 82 y.o. female  who presents to the emergency department today because of concern for an episode where her knees went out from under her. The patient states that she was in a parking lot walking to a shop when her knees gave out. She denies any chest pain or palpitations when this occurred. The patient states that she had a similar episode a little time ago.  That however occurred when she was sitting on the porch.  But similarly she became very pale.  By the time my exam patient states she is feeling better.  She ate and drink her normal amount today.  Denies any recent fevers.  Denies any urinary symptoms.      Physical Exam   Triage Vital Signs: ED Triage Vitals  Enc Vitals Group     BP 10/12/22 1700 (!) 138/49     Pulse Rate 10/12/22 1700 (!) 56     Resp 10/12/22 1700 17     Temp 10/12/22 1700 98.3 F (36.8 C)     Temp Source 10/12/22 1700 Oral     SpO2 10/12/22 1700 98 %     Weight --      Height --      Head Circumference --      Peak Flow --      Pain Score 10/12/22 1645 0     Pain Loc --      Pain Edu? --      Excl. in [place]? --     Most recent vital signs: Vitals:   10/12/22 1700 10/12/22 2016  BP: (!) 138/49 132/72  Pulse: (!) 56 63  Resp: 17 14  Temp: 98.3 F (36.8 C)   SpO2: 98% 99%   General: Awake, alert, oriented. CV:  Good peripheral perfusion. Regular rate and rhythm. Resp:  Normal effort. Lungs clear. Abd:  No distention.     ED Results / Procedures / Treatments   Labs (all labs ordered are listed, but only abnormal results are displayed) Labs Reviewed  PROTIME-INR - Abnormal; Notable for the following components:      Result Value   Prothrombin Time 16.4 (*)    INR 1.3 (*)    All other components within normal limits  APTT -  Abnormal; Notable for the following components:   aPTT 52 (*)    All other components within normal limits  CBC - Abnormal; Notable for the following components:   RBC 3.75 (*)    Hemoglobin 9.5 (*)    HCT 31.5 (*)    MCH 25.3 (*)    All other components within normal limits  COMPREHENSIVE METABOLIC PANEL - Abnormal; Notable for the following components:   Creatinine, Ser 1.03 (*)    GFR, Estimated 54 (*)    All other components within normal limits  DIFFERENTIAL  CBG MONITORING, ED     EKG  I, Nance Pear, attending physician, personally viewed and interpreted this EKG  EKG Time: 1657 Rate: 58 Rhythm: sinus bradycardia Axis: normal Intervals: qtc 443 QRS: narrow, low voltage ST changes: no st elevation Impression: abnormal ekg   RADIOLOGY I independently interpreted and visualized the CT head. My interpretation: No acute bleed Radiology interpretation:  IMPRESSION:  1. No acute intracranial abnormality.  2. Chronic right posterior parietal lobe infarct.  3. Generalized cerebral atrophy and microvascular disease changes of  the supratentorial brain.      PROCEDURES:  Critical Care performed: No  Procedures   MEDICATIONS ORDERED IN ED: Medications - No data to display   IMPRESSION / MDM / [place] / ED COURSE  I reviewed the triage vital signs and the nursing notes.                              Differential diagnosis includes, but is not limited to, acs, anemia, electrolyte abnormality, intracranial process, arrhythmia.  Patient's presentation is most consistent with acute presentation with potential threat to life or bodily function.   Patient presented to the emergency department today after her knees gave out while she was walking.  The time my exam patient states she is feeling better.  Head CT here does not show any acute process.  Blood work with troponin negative x 2.  No significant anemia or electrolyte abnormality.  EKG without  ST elevation or arrhythmia.  At this time I do wonder if patient might of suffered a vasovagal episode.  I discussed this with the patient family.  It would potentially also explain the previous episode.  At this time I have low suspicion for ACS.  Will plan on discharging to follow-up with outpatient providers.  Discussed return precautions.   FINAL CLINICAL IMPRESSION(S) / ED DIAGNOSES   Final diagnoses:  Near syncope  Weakness     Note:  This document was prepared using Dragon voice recognition software and may include unintentional dictation errors.    Nance Pear, MD 10/12/22 [phone]

## 2022-10-16 ENCOUNTER — Other Ambulatory Visit: Payer: Self-pay | Admitting: Physician Assistant

## 2022-10-16 ENCOUNTER — Ambulatory Visit
Admission: RE | Admit: 2022-10-16 | Discharge: 2022-10-16 | Disposition: A | Discharge status: Home / Self Care | Payer: Medicare Other | Source: Ambulatory Visit | Attending: Internal Medicine | Admitting: Internal Medicine

## 2022-10-16 ENCOUNTER — Telehealth: Payer: Self-pay

## 2022-10-16 DIAGNOSIS — I2510 Atherosclerotic heart disease of native coronary artery without angina pectoris: Secondary | ICD-10-CM | POA: Diagnosis not present

## 2022-10-16 DIAGNOSIS — J84112 Idiopathic pulmonary fibrosis: Secondary | ICD-10-CM | POA: Diagnosis not present

## 2022-10-16 DIAGNOSIS — J849 Interstitial pulmonary disease, unspecified: Secondary | ICD-10-CM

## 2022-10-16 DIAGNOSIS — J479 Bronchiectasis, uncomplicated: Secondary | ICD-10-CM | POA: Diagnosis not present

## 2022-10-16 DIAGNOSIS — J432 Centrilobular emphysema: Secondary | ICD-10-CM | POA: Diagnosis not present

## 2022-10-16 MED ORDER — DONEPEZIL HCL 10 MG PO TABS: 10.0000 mg | ORAL_TABLET | Freq: Every day | ORAL | 1 refills | Status: DC

## 2022-10-17 NOTE — Telephone Encounter (Signed)
Pt  daughter advised we send med

## 2022-10-21 NOTE — Procedures (Signed)
[facility] [address] [place], [id number]    Complete Pulmonary Function Testing Interpretation:  FINDINGS:  Forced vital capacity is mildly decreased.  FEV1 is normal.  FEV1 FVC ratio is increased.  Postbronchodilator no significant change in FEV1 is noted.  Total lung capacity is mildly decreased.  Residual volume is decreased.  FRC was not measurable DLCO was also not measurable.  IMPRESSION:  This pulmonary function study is inadequate to interpret as patient was not able to properly perform the maneuvers needed for lung measurements however the spirometry is suggestive of normal spirometry.  Clinical correlation is recommended  Allyne Gee, MD [facility] Pulmonary Critical Care Medicine Sleep Medicine

## 2022-10-23 ENCOUNTER — Ambulatory Visit: Payer: Medicare Other | Admitting: Internal Medicine

## 2022-10-23 ENCOUNTER — Ambulatory Visit (INDEPENDENT_AMBULATORY_CARE_PROVIDER_SITE_OTHER): Payer: Medicare Other | Admitting: Internal Medicine

## 2022-10-23 ENCOUNTER — Encounter: Payer: Self-pay | Admitting: Internal Medicine

## 2022-10-23 VITALS — BP 132/66 | HR 72 | Temp 97.6°F | Resp 16 | Ht 61.0 in | Wt 208.0 lb

## 2022-10-23 DIAGNOSIS — I272 Pulmonary hypertension, unspecified: Secondary | ICD-10-CM | POA: Diagnosis not present

## 2022-10-23 DIAGNOSIS — J849 Interstitial pulmonary disease, unspecified: Secondary | ICD-10-CM

## 2022-10-23 DIAGNOSIS — I480 Paroxysmal atrial fibrillation: Secondary | ICD-10-CM | POA: Diagnosis not present

## 2022-10-23 DIAGNOSIS — Z9981 Dependence on supplemental oxygen: Secondary | ICD-10-CM | POA: Diagnosis not present

## 2022-10-23 LAB — PULMONARY FUNCTION TEST

## 2022-10-23 NOTE — Patient Instructions (Signed)
Steps to Quit Smoking Smoking tobacco is the leading cause of preventable death. It can affect almost every organ in the body. Smoking puts you and people around you at risk for many serious, long-lasting (chronic) diseases. Quitting smoking can be hard, but it is one of the best things that you can do for your health. It is never too late to quit. Do not give up if you cannot quit the first time. Some people need to try many times to quit. Do your best to stick to your quit plan, and talk with your doctor if you have any questions or concerns. How do I get ready to quit? Pick a date to quit. Set a date within the next 2 weeks to give you time to prepare. Write down the reasons why you are quitting. Keep this list in places where you will see it often. Tell your family, friends, and co-workers that you are quitting. Their support is important. Talk with your doctor about the choices that may help you quit. Find out if your health insurance will pay for these treatments. Know the people, places, things, and activities that make you want to smoke (triggers). Avoid them. What first steps can I take to quit smoking? Throw away all cigarettes at home, at work, and in your car. Throw away the things that you use when you smoke, such as ashtrays and lighters. Clean your car. Empty the ashtray. Clean your home, including curtains and carpets. What can I do to help me quit smoking? Talk with your doctor about taking medicines and seeing a counselor. You are more likely to succeed when you do both. If you are pregnant or breastfeeding: Talk with your doctor about counseling or other ways to quit smoking. Do not take medicine to help you quit smoking unless your doctor tells you to. Quit right away Quit smoking completely, instead of slowly cutting back on how much you smoke over a period of time. Stopping smoking right away may be more successful than slowly quitting. Go to counseling. In-person is best  if this is an option. You are more likely to quit if you go to counseling sessions regularly. Take medicine You may take medicines to help you quit. Some medicines need a prescription, and some you can buy over-the-counter. Some medicines may contain a drug called nicotine to replace the nicotine in cigarettes. Medicines may: Help you stop having the desire to smoke (cravings). Help to stop the problems that come when you stop smoking (withdrawal symptoms). Your doctor may ask you to use: Nicotine patches, gum, or lozenges. Nicotine inhalers or sprays. Non-nicotine medicine that you take by mouth. Find resources Find resources and other ways to help you quit smoking and remain smoke-free after you quit. They include: Online chats with a counselor. Phone quitlines. Printed self-help materials. Support groups or group counseling. Text messaging programs. Mobile phone apps. Use apps on your mobile phone or tablet that can help you stick to your quit plan. Examples of free services include Quit Guide from the CDC and smokefree.gov  What can I do to make it easier to quit?  Talk to your family and friends. Ask them to support and encourage you. Call a phone quitline, such as 1-800-QUIT-NOW, reach out to support groups, or work with a counselor. Ask people who smoke to not smoke around you. Avoid places that make you want to smoke, such as: Bars. Parties. Smoke-break areas at work. Spend time with people who do not smoke. Lower   the stress in your life. Stress can make you want to smoke. Try these things to lower stress: Getting regular exercise. Doing deep-breathing exercises. Doing yoga. Meditating. What benefits will I see if I quit smoking? Over time, you may have: A better sense of smell and taste. Less coughing and sore throat. A slower heart rate. Lower blood pressure. Clearer skin. Better breathing. Fewer sick days. Summary Quitting smoking can be hard, but it is one of  the best things that you can do for your health. Do not give up if you cannot quit the first time. Some people need to try many times to quit. When you decide to quit smoking, make a plan to help you succeed. Quit smoking right away, not slowly over a period of time. When you start quitting, get help and support to keep you smoke-free. This information is not intended to replace advice given to you by your health care provider. Make sure you discuss any questions you have with your health care provider. Document Revised: 07/28/2021 Document Reviewed: 07/28/2021 Elsevier Patient Education  2023 [employer].  

## 2022-10-23 NOTE — Progress Notes (Signed)
[facility] [place], [place] [address]  Pulmonary Sleep Medicine   Office Visit Note  Patient Name: [name] Douglas DOB: 07/28/1941 MRN [id number]  Date of Service: 10/23/2022  Complaints/HPI: IPF hemoptysis patient is here for follow-up of her studies that were done.  Patient had a CT scan done of the chest which is consistent with idiopathic pulmonary fibrosis.  She had a pulmonary function study done which shows preserved FEV1 with mild reduction in the total lung capacity however study is limited as she had some difficulty performing the maneuver so I am not clear as to how reliable the results are.  Unfortunately she continues to smoke she has been told numerous times that is off and by her family that she needs to stop smoking.  She states that she goes down out of her apartment to smoke and she also does not use oxygen at times.  The other concern that the daughter had was her hemoptysis.  Patient is on Eliquis so it is conceivable but that the Eliquis could be contributing.  In addition to that she has pulmonary hypertension which in itself could be contributing.  Overall there multivessel coronary disease multiple comorbid conditions she is not a candidate for any kind of intervention at this time.  ROS  General: (-) fever, (-) chills, (-) night sweats, (-) weakness Skin: (-) rashes, (-) itching,. Eyes: (-) visual changes, (-) redness, (-) itching. Nose and Sinuses: (-) nasal stuffiness or itchiness, (-) postnasal drip, (-) nosebleeds, (-) sinus trouble. Mouth and Throat: (-) sore throat, (-) hoarseness. Neck: (-) swollen glands, (-) enlarged thyroid, (-) neck pain. Respiratory: + cough, (-) bloody sputum, + shortness of breath, - wheezing. Cardiovascular: - ankle swelling, (-) chest pain. Lymphatic: (-) lymph node enlargement. Neurologic: (-) numbness, (-) tingling. Psychiatric: (-) anxiety, (-) depression   Current Medication: Outpatient Encounter  Medications as of 10/23/2022  Medication Sig   amLODipine (NORVASC) 5 MG tablet TAKE 1 TABLET BY MOUTH DAILY   apixaban (ELIQUIS) 5 MG TABS tablet Take 1 tablet (5 mg total) by mouth 2 (two) times daily.   atorvastatin (LIPITOR) 80 MG tablet TAKE 1 TABLET BY MOUTH DAILY AT 6PM   benzonatate (TESSALON) 100 MG capsule Take 1 capsule (100 mg total) by mouth 2 (two) times daily as needed for cough.   donepezil (ARICEPT) 10 MG tablet Take 1 tablet (10 mg total) by mouth at bedtime.   flecainide (TAMBOCOR) 50 MG tablet Take 1 tablet (50 mg total) by mouth 2 (two) times daily.   furosemide (LASIX) 20 MG tablet Take 1 tablet (20 mg total) by mouth every other day.   JARDIANCE 10 MG TABS tablet TAKE 1 TABLET BY MOUTH DAILY BEFORE BREAKFAST   levothyroxine (SYNTHROID) 88 MCG tablet TAKE 1 TABLET BY MOUTH DAILY   metoprolol succinate (TOPROL-XL) 50 MG 24 hr tablet TAKE 1 TABLETS BY MOUTH DAILY. TAKE WITHOR IMMEDIATELY FOLLOWING A MEAL.   venlafaxine XR (EFFEXOR-XR) 75 MG 24 hr capsule TAKE 2 CAPSULES BY MOUTH DAILY   Vitamin D, Ergocalciferol, (DRISDOL) 1.25 MG (50000 UNIT) CAPS capsule Take 1 capsule by mouth weekly   No facility-administered encounter medications on file as of 10/23/2022.    Surgical History: Past Surgical History:  Procedure Laterality Date   CARDIAC CATHETERIZATION  ~ 2007   EXCISIONAL HEMORRHOIDECTOMY  1960's   EYE SURGERY Bilateral    Cataract extraction with IOL   KNEE ARTHROSCOPY Left X 2 <2008   KNEE ARTHROSCOPY WITH SUBCHONDROPLASTY  Right 04/19/2016   Procedure: KNEE ARTHROSCOPY WITH SUBCHONDROPLASTY, PARTIAL MENISCECTOMY;  Surgeon: Hessie Knows, MD;  Location: [facility];  Service: Orthopedics;  Laterality: Right;   LOOP RECORDER INSERTION N/A 01/02/2018   Procedure: LOOP RECORDER INSERTION;  Surgeon: Deboraha Sprang, MD;  Location: [facility];  Service: Cardiovascular;  Laterality: N/A;   LUMBAR LAMINECTOMY/DECOMPRESSION MICRODISCECTOMY N/A 02/06/2021   Procedure:  Lumbar Three-Four, Lumbar Four-Five Laminectomy and Foraminotomy;  Surgeon: Kristeen Miss, MD;  Location: [facility];  Service: Neurosurgery;  Laterality: N/A;   SHOULDER OPEN ROTATOR CUFF REPAIR Right 2001   TONSILLECTOMY  ~ [place] ARTHROPLASTY Left 2008   TOTAL KNEE ARTHROPLASTY Right 07/03/2016   Procedure: TOTAL KNEE ARTHROPLASTY;  Surgeon: Hessie Knows, MD;  Location: [facility];  Service: Orthopedics;  Laterality: Right;   TUBAL LIGATION  1973   VAGINAL HYSTERECTOMY  1974    Medical History: Past Medical History:  Diagnosis Date   Anxiety    Arthritis    "shoulders" (08/20/2014)   Asthma    Chronic lower back pain    Complication of anesthesia    "agitated & restless after knee replacement in 2008"   COPD (chronic obstructive pulmonary disease) (HCC)    DDD (degenerative disc disease), lumbar    Depression    Diabetes mellitus without complication (HCC)    Diverticulosis    Family history of adverse reaction to anesthesia    GERD (gastroesophageal reflux disease)    High cholesterol    Hypertension    Hypothyroidism    Memory loss    mild   On home oxygen therapy    "2L at night" (08/20/2014)   PAF (paroxysmal atrial fibrillation) (HCC)    Peripheral vascular disease (HCC)    Pre-diabetes    Shortness of breath dyspnea    with exertion   Stroke (HCC)    mild, no deficits    Family History: Family History  Problem Relation Age of Onset   Diabetes Mother    Hypertension Mother    Lung cancer Mother    Congestive Heart Failure Father    Congestive Heart Failure Sister     Social History: Social History   Socioeconomic History   Marital status: Divorced    Spouse name: Not on file   Number of children: Not on file   Years of education: Not on file   Highest education level: Not on file  Occupational History   Not on file  Tobacco Use   Smoking status: Every Day    Packs/day: 0.25    Years: 53.00    Total pack years: 13.25    Types: Cigarettes    Smokeless tobacco: Never   Tobacco comments:    2 cigarettes per day  Vaping Use   Vaping Use: Never used  Substance and Sexual Activity   Alcohol use: No   Drug use: No   Sexual activity: Not Currently  Other Topics Concern   Not on file  Social History Narrative   Not on file   Social Determinants of Health   Financial Resource Strain: Not on file  Food Insecurity: Not on file  Transportation Needs: Not on file  Physical Activity: Not on file  Stress: Not on file  Social Connections: Not on file  Intimate Partner Violence: Not on file    Vital Signs: Blood pressure 132/66, pulse 72, temperature 97.6 F (36.4 C), resp. rate 16, height '5\' 1"'$  (1.549 m), weight 208 lb (94.3 kg), SpO2  93 %.  Examination: General Appearance: The patient is well-developed, well-nourished, and in no distress. Skin: Gross inspection of skin unremarkable. Head: normocephalic, no gross deformities. Eyes: no gross deformities noted. ENT: ears appear grossly normal no exudates. Neck: Supple. No thyromegaly. No LAD. Respiratory: few rhonchi noted. Cardiovascular: Normal S1 and S2 without murmur or rub. Extremities: No cyanosis. pulses are equal. Neurologic: Alert and oriented. No involuntary movements.  LABS: Recent Results (from the past 2160 hour(s))  Resp panel by RT-PCR (RSV, Flu A&B, Covid) Anterior Nasal Swab     Status: None   Collection Time: 08/07/22  7:50 PM   Specimen: Anterior Nasal Swab  Result Value Ref Range   SARS Coronavirus 2 by RT PCR NEGATIVE NEGATIVE    Comment: (NOTE) SARS-CoV-2 target nucleic acids are NOT DETECTED.  The SARS-CoV-2 RNA is generally detectable in upper respiratory specimens during the acute phase of infection. The lowest concentration of SARS-CoV-2 viral copies this assay can detect is 138 copies/mL. A negative result does not preclude SARS-Cov-2 infection and should not be used as the sole basis for treatment or other patient management decisions. A  negative result may occur with  improper specimen collection/handling, submission of specimen other than nasopharyngeal swab, presence of viral mutation(s) within the areas targeted by this assay, and inadequate number of viral copies(<138 copies/mL). A negative result must be combined with clinical observations, patient history, and epidemiological information. The expected result is Negative.  Fact Sheet for Patients:  EntrepreneurPulse.com.au  Fact Sheet for Healthcare Providers:  IncredibleEmployment.be  This test is no t yet approved or cleared by the [place] FDA and  has been authorized for detection and/or diagnosis of SARS-CoV-2 by FDA under an Emergency Use Authorization (EUA). This EUA will remain  in effect (meaning this test can be used) for the duration of the COVID-19 declaration under Section 564(b)(1) of the Act, 21 U.S.C.section 360bbb-3(b)(1), unless the authorization is terminated  or revoked sooner.       Influenza A by PCR NEGATIVE NEGATIVE   Influenza B by PCR NEGATIVE NEGATIVE    Comment: (NOTE) The Xpert Xpress SARS-CoV-2/FLU/RSV plus assay is intended as an aid in the diagnosis of influenza from Nasopharyngeal swab specimens and should not be used as a sole basis for treatment. Nasal washings and aspirates are unacceptable for Xpert Xpress SARS-CoV-2/FLU/RSV testing.  Fact Sheet for Patients: EntrepreneurPulse.com.au  Fact Sheet for Healthcare Providers: IncredibleEmployment.be  This test is not yet approved or cleared by the [place] FDA and has been authorized for detection and/or diagnosis of SARS-CoV-2 by FDA under an Emergency Use Authorization (EUA). This EUA will remain in effect (meaning this test can be used) for the duration of the COVID-19 declaration under Section 564(b)(1) of the Act, 21 U.S.C. section 360bbb-3(b)(1), unless the authorization is terminated  or revoked.     Resp Syncytial Virus by PCR NEGATIVE NEGATIVE    Comment: (NOTE) Fact Sheet for Patients: EntrepreneurPulse.com.au  Fact Sheet for Healthcare Providers: IncredibleEmployment.be  This test is not yet approved or cleared by the [place] FDA and has been authorized for detection and/or diagnosis of SARS-CoV-2 by FDA under an Emergency Use Authorization (EUA). This EUA will remain in effect (meaning this test can be used) for the duration of the COVID-19 declaration under Section 564(b)(1) of the Act, 21 U.S.C. section 360bbb-3(b)(1), unless the authorization is terminated or revoked.  Performed at [facility], [place] [address] [place], [place] [address]   POCT glycosylated hemoglobin (Hb  A1C)     Status: Abnormal   Collection Time: 08/27/22  1:48 PM  Result Value Ref Range   Hemoglobin A1C 6.5 (A) 4.0 - 5.6 %   HbA1c POC (<> result, manual entry)     HbA1c, POC (prediabetic range)     HbA1c, POC (controlled diabetic range)    TSH     Status: None   Collection Time: 09/17/22  1:06 PM  Result Value Ref Range   TSH 2.177 0.350 - 4.500 uIU/mL    Comment: Performed by a 3rd Generation assay with a functional sensitivity of <=0.01 uIU/mL. Performed at [facility], [address] [place], [place] [address]   Protime-INR     Status: Abnormal   Collection Time: 10/12/22  5:01 PM  Result Value Ref Range   Prothrombin Time 16.4 (H) 11.4 - 15.2 seconds   INR 1.3 (H) 0.8 - 1.2    Comment: (NOTE) INR goal varies based on device and disease states. Performed at [facility], [address] [place], [place] [address]   APTT     Status: Abnormal   Collection Time: 10/12/22  5:01 PM  Result Value Ref Range   aPTT 52 (H) 24 - 36 seconds    Comment:        IF BASELINE aPTT IS ELEVATED, SUGGEST PATIENT RISK ASSESSMENT BE USED TO DETERMINE APPROPRIATE ANTICOAGULANT THERAPY. Performed at [facility], [address] [place], [place] [address]   CBC     Status: Abnormal   Collection Time: 10/12/22  5:01 PM  Result Value Ref Range   WBC 8.3 4.0 - 10.5 K/uL   RBC 3.75 (L) 3.87 - 5.11 MIL/uL   Hemoglobin 9.5 (L) 12.0 - 15.0 g/dL   HCT 31.5 (L) 36.0 - 46.0 %   MCV 84.0 80.0 - 100.0 fL   MCH 25.3 (L) 26.0 - 34.0 pg   MCHC 30.2 30.0 - 36.0 g/dL   RDW 15.0 11.5 - 15.5 %   Platelets 317 150 - 400 K/uL   nRBC 0.0 0.0 - 0.2 %    Comment: Performed at [facility], [address] [place], [place] [address]  Differential     Status: None   Collection Time: 10/12/22  5:01 PM  Result Value Ref Range   Neutrophils Relative % 65 %   Neutro Abs 5.5 1.7 - 7.7 K/uL   Lymphocytes Relative 19 %   Lymphs Abs 1.6 0.7 - 4.0 K/uL   Monocytes Relative 9 %   Monocytes Absolute 0.7 0.1 - 1.0 K/uL   Eosinophils Relative 5 %   Eosinophils Absolute 0.4 0.0 - 0.5 K/uL   Basophils Relative 1 %   Basophils Absolute 0.1 0.0 - 0.1 K/uL   Immature Granulocytes 1 %   Abs Immature Granulocytes 0.04 0.00 - 0.07 K/uL    Comment: Performed at [facility], [address] [place],  [address]  Comprehensive metabolic panel     Status: Abnormal   Collection Time: 10/12/22  5:01 PM  Result Value Ref Range   Sodium 137 135 - 145 mmol/L   Potassium 4.0 3.5 - 5.1 mmol/L   Chloride 99 98 - 111 mmol/L   CO2 26 22 - 32 mmol/L   Glucose, Bld 72 70 - 99 mg/dL    Comment: Glucose reference range applies only to samples taken after fasting for at least 8 hours.   BUN 17 8 - 23 mg/dL   Creatinine, Ser 1.03 (H)  0.44 - 1.00 mg/dL   Calcium 9.0 8.9 - 10.3 mg/dL   Total Protein 6.9 6.5 - 8.1 g/dL   Albumin 3.7 3.5 - 5.0 g/dL   AST 21 15 - 41 U/L   ALT 13 0 - 44 U/L   Alkaline Phosphatase 85 38 - 126 U/L   Total Bilirubin 0.5 0.3 - 1.2 mg/dL   GFR, Estimated 54 (L) >60 mL/min    Comment: (NOTE) Calculated using the CKD-EPI Creatinine Equation (2021)    Anion gap 12 5 - 15     Comment: Performed at [facility], [place], [place] [address]  Troponin I (High Sensitivity)     Status: None   Collection Time: 10/12/22  5:01 PM  Result Value Ref Range   Troponin I (High Sensitivity) 7 <18 ng/L    Comment: (NOTE) Elevated high sensitivity troponin I (hsTnI) values and significant  changes across serial measurements may suggest ACS but many other  chronic and acute conditions are known to elevate hsTnI results.  Refer to the "Links" section for chest pain algorithms and additional  guidance. Performed at [facility], [place], [place] [address]   Troponin I (High Sensitivity)     Status: None   Collection Time: 10/12/22 10:31 PM  Result Value Ref Range   Troponin I (High Sensitivity) 8 <18 ng/L    Comment: (NOTE) Elevated high sensitivity troponin I (hsTnI) values and significant  changes across serial measurements may suggest ACS but many other  chronic and acute conditions are known to elevate hsTnI results.  Refer to the "Links" section for chest pain algorithms and additional  guidance. Performed at [facility], [address] [place], [place] [address]   CBG monitoring, ED     Status: None   Collection Time: 10/12/22 10:38 PM  Result Value Ref Range   Glucose-Capillary 94 70 - 99 mg/dL    Comment: Glucose reference range applies only to samples taken after fasting for at least 8 hours.  Pulmonary Function Test     Status: None   Collection Time: 10/23/22  8:22 AM  Result Value Ref Range   FEV1     FVC     FEV1/FVC     TLC     DLCO      Radiology: CT Chest High Resolution  Result Date: 10/17/2022 CLINICAL DATA:  Chronic lung disease, COPD, ILD, 120 pack-year smoker EXAM: CT CHEST WITHOUT CONTRAST TECHNIQUE: Multidetector CT imaging of the chest was performed following the standard protocol without intravenous contrast. High resolution imaging of the lungs, as well as inspiratory and  expiratory imaging, was performed. RADIATION DOSE REDUCTION: This exam was performed according to the departmental dose-optimization program which includes automated exposure control, adjustment of the mA and/or kV according to patient size and/or use of iterative reconstruction technique. COMPARISON:  None Available. FINDINGS: Cardiovascular: Aortic atherosclerosis. Cardiomegaly. Three-vessel coronary artery calcifications. Enlargement of the main pulmonary artery measuring up to 3.8 cm in caliber. No pericardial effusion. Mediastinum/Nodes: No enlarged mediastinal, hilar, or axillary lymph nodes. Thyroid gland, trachea, and esophagus demonstrate no significant findings. Lungs/Pleura: Mild centrilobular and paraseptal emphysema. Mild pulmonary fibrosis in a pattern without clear apical to basal gradient featuring irregular peripheral interstitial opacity and septal thickening. Mild tubular bronchiectasis without clear evidence of subpleural bronchiolectasis or honeycombing. No pleural effusion or pneumothorax. Upper Abdomen: No acute abnormality. Coarse contour of the liver in the included upper abdomen. Musculoskeletal: No chest wall abnormality.  No acute osseous findings. IMPRESSION: 1. Mild pulmonary fibrosis in a pattern without clear apical to basal gradient featuring irregular peripheral interstitial opacity and septal thickening. Mild tubular bronchiectasis without clear evidence of subpleural bronchiolectasis or honeycombing. Findings are indeterminate for UIP per consensus guidelines: Diagnosis of Idiopathic Pulmonary Fibrosis: An Official ATS/ERS/JRS/ALAT Clinical Practice Guideline. [place], Iss 5, [id number], Apr 20 2017. 2. Mild emphysema. 3. Cardiomegaly and coronary artery disease. 4. Enlargement of the main pulmonary artery, as can be seen in pulmonary hypertension. 5. Coarse contour of the liver in the included upper abdomen, suggestive of cirrhosis. Aortic Atherosclerosis  (ICD10-I70.0) and Emphysema (ICD10-J43.9). Electronically Signed   By: Delanna Ahmadi M.D.   On: 10/17/2022 13:37    No results found.  CT Chest High Resolution  Result Date: 10/17/2022 CLINICAL DATA:  Chronic lung disease, COPD, ILD, 120 pack-year smoker EXAM: CT CHEST WITHOUT CONTRAST TECHNIQUE: Multidetector CT imaging of the chest was performed following the standard protocol without intravenous contrast. High resolution imaging of the lungs, as well as inspiratory and expiratory imaging, was performed. RADIATION DOSE REDUCTION: This exam was performed according to the departmental dose-optimization program which includes automated exposure control, adjustment of the mA and/or kV according to patient size and/or use of iterative reconstruction technique. COMPARISON:  None Available. FINDINGS: Cardiovascular: Aortic atherosclerosis. Cardiomegaly. Three-vessel coronary artery calcifications. Enlargement of the main pulmonary artery measuring up to 3.8 cm in caliber. No pericardial effusion. Mediastinum/Nodes: No enlarged mediastinal, hilar, or axillary lymph nodes. Thyroid gland, trachea, and esophagus demonstrate no significant findings. Lungs/Pleura: Mild centrilobular and paraseptal emphysema. Mild pulmonary fibrosis in a pattern without clear apical to basal gradient featuring irregular peripheral interstitial opacity and septal thickening. Mild tubular bronchiectasis without clear evidence of subpleural bronchiolectasis or honeycombing. No pleural effusion or pneumothorax. Upper Abdomen: No acute abnormality. Coarse contour of the liver in the included upper abdomen. Musculoskeletal: No chest wall abnormality. No acute osseous findings. IMPRESSION: 1. Mild pulmonary fibrosis in a pattern without clear apical to basal gradient featuring irregular peripheral interstitial opacity and septal thickening. Mild tubular bronchiectasis without clear evidence of subpleural bronchiolectasis or honeycombing.  Findings are indeterminate for UIP per consensus guidelines: Diagnosis of Idiopathic Pulmonary Fibrosis: An Official ATS/ERS/JRS/ALAT Clinical Practice Guideline. [place], Iss 5, [id number], Apr 20 2017. 2. Mild emphysema. 3. Cardiomegaly and coronary artery disease. 4. Enlargement of the main pulmonary artery, as can be seen in pulmonary hypertension. 5. Coarse contour of the liver in the included upper abdomen, suggestive of cirrhosis. Aortic Atherosclerosis (ICD10-I70.0) and Emphysema (ICD10-J43.9). Electronically Signed   By: Delanna Ahmadi M.D.   On: 10/17/2022 13:37   CT HEAD WO CONTRAST  Result Date: 10/12/2022 CLINICAL DATA:  Near syncope. EXAM: CT HEAD WITHOUT CONTRAST TECHNIQUE: Contiguous axial images were obtained from the base of the skull through the vertex without intravenous contrast. RADIATION DOSE REDUCTION: This exam was performed according to the departmental dose-optimization program which includes automated exposure control, adjustment of the mA and/or kV according to patient size and/or use of iterative reconstruction technique. COMPARISON:  October 12, 2020 FINDINGS: Brain: There is mild cerebral atrophy with widening of the extra-axial spaces and ventricular dilatation. There are areas of decreased attenuation within the white matter tracts of the supratentorial brain, consistent with microvascular disease changes. A chronic right posterior parietal lobe infarct is seen. This is present on the prior exam. Vascular: No hyperdense vessel or unexpected calcification. Skull: Normal. Negative  for fracture or focal lesion. Sinuses/Orbits: No acute finding. Other: None. IMPRESSION: 1. No acute intracranial abnormality. 2. Chronic right posterior parietal lobe infarct. 3. Generalized cerebral atrophy and microvascular disease changes of the supratentorial brain. Electronically Signed   By: [place]gina Norfolk M.D.   On: 10/12/2022 17:44      Assessment and  Plan: Patient Active Problem List   Diagnosis Date Noted   History of loop recorder 03/02/2022   Palpitations    Elevated troponin    SVT (supraventricular tachycardia)    PAF (paroxysmal atrial fibrillation) ([place])    Chest pain 05/02/2021   Lumbar stenosis with neurogenic claudication 02/06/2021   Compression fracture of fifth lumbar vertebra ([place]) 12/20/2020   DDD (degenerative disc disease), thoracolumbar 11/22/2020   DDD (degenerative disc disease), thoracic 11/10/2020   TIA (transient ischemic attack) 10/12/2020   Left shoulder pain 10/12/2020   Chronic thoracic back pain (Left) 10/10/2020   T12 compression fracture, sequela 10/10/2020   Lumbar foraminal stenosis (Bilateral: L2-3, L3-4, L4-5) (Right: L5-S1) (Severe left L4-5) 09/08/2020   Grade 1 Lumbar Anterolisthesis (L3/4 and L4/5) 09/08/2020   Grade 1 Lumbar Retrolisthesis (L2/3) 09/08/2020   Protrusion of lumbar intervertebral disc (Multilevel) 09/08/2020   Compression fracture of L1 lumbar vertebra, sequela 09/08/2020   Other intervertebral disc degeneration, lumbar region 09/08/2020   Lumbosacral facet syndrome (Multilevel) (Bilateral) 09/08/2020   Lumbar facet hypertrophy (Multilevel) (Bilateral) 09/08/2020   Ligamentum flavum hypertrophy 09/08/2020   Lumbar lateral recess stenosis (Right: L2-3) 09/08/2020   DDD (degenerative disc disease), lumbosacral 09/08/2020   Compression fracture of L5 vertebra ([place]) 09/08/2020   Osteoporosis with pathological fracture of lumbar vertebra ([place]) 09/08/2020   Vitamin B12 deficiency 09/08/2020   Chronic pain syndrome 09/07/2020   Pharmacologic therapy 09/07/2020   Disorder of skeletal system 09/07/2020   Problems influencing health status [id number]   Uncomplicated opioid dependence ([place]) 09/07/2020   Chronic low back pain (2ry area of Pain) (Bilateral) (R>L) w/ sciatica (Right) 09/07/2020   Chronic lower extremity pain (1ry area of Pain) (Right) 09/07/2020   Chronic lumbar  radiculopathy (Right) 09/07/2020   Lumbosacral radiculopathy at L2 (Right) 09/07/2020   Lumbar radiculitis (L2 dermatome) (Right) 09/07/2020   Unspecified inflammatory spondylopathy, lumbar region ([place]) 09/07/2020   Chronic anticoagulation ([place]) 09/07/2020   Abnormal MRI, lumbar spine (04/20/2020) 09/07/2020   Intractable low back pain 08/03/2020   Fall down stairs 08/03/2020   Hemorrhagic stroke ([place]) 08/03/2020   Dizziness and giddiness 07/26/2020   Other chronic pain 07/26/2020   Other sequelae of cerebral infarction 07/18/2020   CVA (cerebral vascular accident) ([place]) 07/14/2020   Blurred vision 07/14/2020   Lumbar central spinal stenosis (Moderate: L2-3  Severe: L3-4, L4-5) w/ neurogenic claudication 05/31/2020   Muscle spasm 04/10/2020   Urinary tract infection with hematuria 04/10/2020   Headache disorder 12/21/2019   History of stroke 12/21/2019   Dizziness 11/06/2019   Atopic dermatitis 10/28/2019   History of CVA (cerebrovascular accident) 10/07/2019   Intractable episodic cluster headache 09/20/2019   Other symptoms and signs involving the nervous system 09/20/2019   Anxiety disorder, unspecified 08/21/2019   Dependence on supplemental oxygen 08/21/2019   Presence of artificial knee joint, bilateral 08/21/2019   Nicotine dependence, cigarettes, uncomplicated [id number]   Iron deficiency anemia 05/17/2019   Vitamin D deficiency 05/17/2019   Flu vaccine need 05/17/2019   Encounter for general adult medical examination with abnormal findings 04/05/2019   Nausea and vomiting 04/05/2019   Dysuria 04/05/2019   Need for vaccination against  Streptococcus pneumoniae using pneumococcal conjugate vaccine 7 04/05/2019   Occlusion and stenosis of bilateral carotid arteries 11/24/2017   Subarachnoid hemorrhage ([place]) 12/09/2016   Solitary pulmonary nodule 08/24/2016   Primary localized osteoarthritis of right knee 07/03/2016   Ileus ([place]) 04/24/2016   Colitis due to  Clostridium difficile    Recurrent Clostridium difficile diarrhea    Enteritis due to Clostridium difficile 08/22/2014   Diarrhea 08/20/2014   Essential hypertension 08/20/2014   Hyperlipidemia 08/20/2014   Hypothyroidism 08/20/2014   Tobacco abuse 08/20/2014   COPD (chronic obstructive pulmonary disease) ([place]) 08/20/2014   Acute diarrhea 07/27/2014    1. ILD (interstitial lung disease) ([place]) She has a CT scan is consistent with idiopathic pulmonary fibrosis spoke to her and to the daughter that she is not a candidate for any significant intervention at this time because of her multiple comorbid conditions and ongoing medical issues.  She does need to definitely stop smoking she needs to continue with oxygen use as prescribed.  2. PAF (paroxysmal atrial fibrillation) ([place]) Rate is controlled at this time she is on Eliquis this could conceivably be contributing to her periodic hemoptysis that she is having.  If she does have any massive hemoptysis and she needs to stop Eliquis and she is to go to the hospital immediately  3. Dependence on supplemental oxygen Continues to be more compliant with her oxygen she does have a history of noncompliance she takes it off several times obviously she is still smoking she needs to stop smoking  4. Pulmonary hypertension (HCC) We will continue with supportive care oxygen therapy spoke to them about what is the possible causes of the pulmonary hypertension more than likely is related to chronic hypoxia as well as the pulmonary fibrosis.   General Counseling: I have discussed the findings of the evaluation and examination with [name] Douglas.  I have also discussed any further diagnostic evaluation thatmay be needed or ordered today. [name] Douglas verbalizes understanding of the findings of todays visit. We also reviewed her medications today and discussed drug interactions and side effects including but not limited excessive drowsiness and altered mental states. We also  discussed that there is always a risk not just to her but also people around her. she has been encouraged to call the office with any questions or concerns that should arise related to todays visit.  No orders of the defined types were placed in this encounter.    Time spent: 23   I have personally obtained a history, examined the patient, evaluated laboratory and imaging results, formulated the assessment and plan and placed orders.    Allyne Gee, MD [facility] Pulmonary and Critical Care Sleep medicine

## 2022-10-30 ENCOUNTER — Telehealth: Payer: Self-pay | Admitting: Internal Medicine

## 2022-10-30 NOTE — Telephone Encounter (Signed)
New Message:    Daughter called and said patient had a CT of the chest about 2 weeks ago. Her pulmonary doctor told daughter to call Dr Caryl Comes and have him review the CT. He also told her to see what Dr Caryl Comes will suggest after reviewing the results. The doctor said  usually they would do a Cath, but because of her age, he is not sure what Dr Caryl Comes will suggest.

## 2022-10-31 ENCOUNTER — Other Ambulatory Visit: Payer: Self-pay

## 2022-10-31 DIAGNOSIS — I10 Essential (primary) hypertension: Secondary | ICD-10-CM

## 2022-10-31 MED ORDER — METOPROLOL SUCCINATE ER 50 MG PO TB24: ORAL_TABLET | ORAL | 1 refills | Status: DC

## 2022-10-31 NOTE — Telephone Encounter (Signed)
Attempted phone call to pt's daughter and left voicemail message to contact RN at [phone].

## 2022-10-31 NOTE — Telephone Encounter (Signed)
Please Inform Patient That was grateful to have a chance to review the CT report,  I dont see any particular issues.  I did review Dr Trish Mage note and his concern regarding the coughing up of blood and if it becomes a major issue we may have to stop Apixaban  -- I agree witih that  Thanks

## 2022-11-01 NOTE — Telephone Encounter (Signed)
Spoke with pt's daughter, DPR and advised of Dr Olin Pia comments as below re:CT report.  Pt's daughter verbalizes understanding and thanked Therapist, sports for the call back.

## 2022-11-01 NOTE — Telephone Encounter (Signed)
Pt's daughter is requesting call back. States that if she does not answer, to please leave detailed message.

## 2022-11-06 ENCOUNTER — Other Ambulatory Visit: Payer: Self-pay | Admitting: Internal Medicine

## 2022-11-06 ENCOUNTER — Other Ambulatory Visit: Payer: Self-pay | Admitting: Physician Assistant

## 2022-11-06 DIAGNOSIS — I10 Essential (primary) hypertension: Secondary | ICD-10-CM

## 2022-11-08 ENCOUNTER — Encounter: Payer: Self-pay | Admitting: Physician Assistant

## 2022-11-08 ENCOUNTER — Ambulatory Visit (INDEPENDENT_AMBULATORY_CARE_PROVIDER_SITE_OTHER): Payer: Medicare Other | Admitting: Physician Assistant

## 2022-11-08 ENCOUNTER — Telehealth: Payer: Self-pay | Admitting: Internal Medicine

## 2022-11-08 VITALS — BP 130/59 | HR 90 | Temp 98.3°F | Resp 16 | Ht 61.0 in | Wt 206.0 lb

## 2022-11-08 DIAGNOSIS — R413 Other amnesia: Secondary | ICD-10-CM

## 2022-11-08 DIAGNOSIS — R4189 Other symptoms and signs involving cognitive functions and awareness: Secondary | ICD-10-CM

## 2022-11-08 DIAGNOSIS — R4689 Other symptoms and signs involving appearance and behavior: Secondary | ICD-10-CM

## 2022-11-08 DIAGNOSIS — Z96652 Presence of left artificial knee joint: Secondary | ICD-10-CM | POA: Diagnosis not present

## 2022-11-08 DIAGNOSIS — M25362 Other instability, left knee: Secondary | ICD-10-CM

## 2022-11-08 DIAGNOSIS — Z9981 Dependence on supplemental oxygen: Secondary | ICD-10-CM

## 2022-11-08 DIAGNOSIS — G4700 Insomnia, unspecified: Secondary | ICD-10-CM | POA: Diagnosis not present

## 2022-11-08 DIAGNOSIS — J849 Interstitial pulmonary disease, unspecified: Secondary | ICD-10-CM

## 2022-11-08 DIAGNOSIS — I10 Essential (primary) hypertension: Secondary | ICD-10-CM

## 2022-11-08 DIAGNOSIS — E559 Vitamin D deficiency, unspecified: Secondary | ICD-10-CM

## 2022-11-08 MED ORDER — VITAMIN D (ERGOCALCIFEROL) 1.25 MG (50000 UNIT) PO CAPS: ORAL_CAPSULE | ORAL | 3 refills | Status: DC

## 2022-11-08 MED ORDER — TRAZODONE HCL 50 MG PO TABS: ORAL_TABLET | ORAL | 1 refills | Status: DC

## 2022-11-08 NOTE — Progress Notes (Signed)
[facility] [place], [place] [address]  Internal MEDICINE  Office Visit Note  Patient Name: [name] Douglas  [id number]  [id number]  Date of Service: 11/09/2022  Chief Complaint  Patient presents with   Acute Visit   Knee Pain    Left knee pain buckles and causes patient to fall. Causing toe numbness.   Fatigue    HPI Pt is here for sick visit with her daughter -Left knee is giving away and has some tingling in left toes as well. This makes her at risk of fall -Nervous and gets worried about things. Feels she has limited strength and fatigue -has not had further coughing up blood, and cardiology recommended for her to monitor this and if recurring then they may stop Eliquis--continued for now Daughter also has lists of concerns today. She wants to know about MRI of brain due to dementia worsening. Vs neuropsych eval. Also pt is not sleeping well and feeling nervous--needs something to help this. ---Oxygen up to 4L, the other night went to walk her plate into kitchen and could hear heavy breathing and will turn up to 4.5L if needed. Discussed monitoring Ox with home pulse ox and she may sound lke she is breathing heavy when exerting herself but should check saturations -peptobismol every 2 weeks for bouts of diarrhea periodically, but has not had this in a few weeks. No recent ABX --Daughter also states that pt does not want to let her family do things to help her, doesn't always listen if things are wrong such as knee issue. Wouldn't go to ED or ortho when problems have arisen. -last fall was last Wednesday and now has a  life alert fall necklace. States she did not lose consciousness and no changes in behavior or anything since fall.  -Discussed a neuropsych evaluation would be beneficial to evaluate memory as well as behavioral changes that are likely impacting patient's refusal for aid and putting things off. Will go ahead and try 1/2 tab trazodone for bed,  though discussed risks of further drowsiness, especially if combined with other medication like gabapentin. Daughter expressed understanding. -Also discussed with patient that it would be best for her to let her family take her to [facility] to have her knee evaluated so that imaging and treatment could be taken care of in one location. Pt was agreeable to go there from office today and not put it off longer. Also advised to change position slowly and use her walker to take break if needed.  Current Medication: Outpatient Encounter Medications as of 11/08/2022  Medication Sig   amLODipine (NORVASC) 5 MG tablet TAKE 1 TABLET BY MOUTH DAILY   apixaban (ELIQUIS) 5 MG TABS tablet Take 1 tablet (5 mg total) by mouth 2 (two) times daily.   atorvastatin (LIPITOR) 80 MG tablet TAKE 1 TABLET BY MOUTH DAILY AT 6PM   benzonatate (TESSALON) 100 MG capsule Take 1 capsule (100 mg total) by mouth 2 (two) times daily as needed for cough.   donepezil (ARICEPT) 10 MG tablet Take 1 tablet (10 mg total) by mouth at bedtime.   flecainide (TAMBOCOR) 50 MG tablet Take 1 tablet (50 mg total) by mouth 2 (two) times daily.   furosemide (LASIX) 20 MG tablet Take 1 tablet (20 mg total) by mouth every other day.   JARDIANCE 10 MG TABS tablet TAKE 1 TABLET BY MOUTH DAILY BEFORE BREAKFAST   levothyroxine (SYNTHROID) 88 MCG tablet TAKE 1 TABLET BY MOUTH DAILY  traZODone (DESYREL) 50 MG tablet Take 1/2 tablet by mouth before bed.   venlafaxine XR (EFFEXOR-XR) 75 MG 24 hr capsule TAKE 2 CAPSULES BY MOUTH DAILY   [DISCONTINUED] metoprolol succinate (TOPROL-XL) 50 MG 24 hr tablet TAKE 1 TABLETS BY MOUTH DAILY. TAKE WITH OR IMMEDIATELY FOLLOWING A MEAL   [DISCONTINUED] Vitamin D, Ergocalciferol, (DRISDOL) 1.25 MG (50000 UNIT) CAPS capsule Take 1 capsule by mouth weekly   Vitamin D, Ergocalciferol, (DRISDOL) 1.25 MG (50000 UNIT) CAPS capsule Take 1 capsule by mouth weekly   No facility-administered encounter medications on file as  of 11/08/2022.    Surgical History: Past Surgical History:  Procedure Laterality Date   CARDIAC CATHETERIZATION  ~ 2007   EXCISIONAL HEMORRHOIDECTOMY  1960's   EYE SURGERY Bilateral    Cataract extraction with IOL   KNEE ARTHROSCOPY Left X 2 <2008   KNEE ARTHROSCOPY WITH SUBCHONDROPLASTY Right 04/19/2016   Procedure: KNEE ARTHROSCOPY WITH SUBCHONDROPLASTY, PARTIAL MENISCECTOMY;  Surgeon: Hessie Knows, MD;  Location: [facility];  Service: Orthopedics;  Laterality: Right;   LOOP RECORDER INSERTION N/A 01/02/2018   Procedure: LOOP RECORDER INSERTION;  Surgeon: Deboraha Sprang, MD;  Location: [facility];  Service: Cardiovascular;  Laterality: N/A;   LUMBAR LAMINECTOMY/DECOMPRESSION MICRODISCECTOMY N/A 02/06/2021   Procedure: Lumbar Three-Four, Lumbar Four-Five Laminectomy and Foraminotomy;  Surgeon: Kristeen Miss, MD;  Location: [place];  Service: Neurosurgery;  Laterality: N/A;   SHOULDER OPEN ROTATOR CUFF REPAIR Right 2001   TONSILLECTOMY  ~ [place] ARTHROPLASTY Left 2008   TOTAL KNEE ARTHROPLASTY Right 07/03/2016   Procedure: TOTAL KNEE ARTHROPLASTY;  Surgeon: Hessie Knows, MD;  Location: [facility];  Service: Orthopedics;  Laterality: Right;   TUBAL LIGATION  1973   VAGINAL HYSTERECTOMY  1974    Medical History: Past Medical History:  Diagnosis Date   Anxiety    Arthritis    "shoulders" (08/20/2014)   Asthma    Chronic lower back pain    Complication of anesthesia    "agitated & restless after knee replacement in 2008"   COPD (chronic obstructive pulmonary disease) (HCC)    DDD (degenerative disc disease), lumbar    Depression    Diabetes mellitus without complication (HCC)    Diverticulosis    Family history of adverse reaction to anesthesia    GERD (gastroesophageal reflux disease)    High cholesterol    Hypertension    Hypothyroidism    Memory loss    mild   On home oxygen therapy    "2L at night" (08/20/2014)   PAF (paroxysmal atrial fibrillation) (HCC)     Peripheral vascular disease (HCC)    Pre-diabetes    Shortness of breath dyspnea    with exertion   Stroke (HCC)    mild, no deficits    Family History: Family History  Problem Relation Age of Onset   Diabetes Mother    Hypertension Mother    Lung cancer Mother    Congestive Heart Failure Father    Congestive Heart Failure Sister     Social History   Socioeconomic History   Marital status: Divorced    Spouse name: Not on file   Number of children: Not on file   Years of education: Not on file   Highest education level: Not on file  Occupational History   Not on file  Tobacco Use   Smoking status: Every Day    Packs/day: 0.25    Years: 53.00    Additional pack years:  0.00    Total pack years: 13.25    Types: Cigarettes   Smokeless tobacco: Never   Tobacco comments:    2 cigarettes per day  Vaping Use   Vaping Use: Never used  Substance and Sexual Activity   Alcohol use: No   Drug use: No   Sexual activity: Not Currently  Other Topics Concern   Not on file  Social History Narrative   Not on file   Social Determinants of Health   Financial Resource Strain: Not on file  Food Insecurity: Not on file  Transportation Needs: Not on file  Physical Activity: Not on file  Stress: Not on file  Social Connections: Not on file  Intimate Partner Violence: Not on file      Review of Systems  Constitutional:  Positive for fatigue. Negative for chills and unexpected weight change.  HENT:  Negative for congestion, postnasal drip, rhinorrhea, sneezing and sore throat.   Eyes:  Negative for redness.  Respiratory:  Negative for cough, chest tightness and shortness of breath.   Cardiovascular:  Negative for chest pain and palpitations.  Gastrointestinal:  Negative for abdominal pain, constipation, diarrhea, nausea and vomiting.  Genitourinary:  Negative for dysuria and frequency.  Musculoskeletal:  Positive for arthralgias. Negative for back pain, joint swelling and  neck pain.  Skin:  Negative for rash.  Neurological:  Positive for weakness. Negative for tremors, syncope, numbness and headaches.  Hematological:  Negative for adenopathy. Does not bruise/bleed easily.  Psychiatric/Behavioral:  Positive for confusion and sleep disturbance. Negative for behavioral problems (Depression) and suicidal ideas. The patient is not nervous/anxious.     Vital Signs: BP (!) 130/59   Pulse 90   Temp 98.3 F (36.8 C)   Resp 16   Ht 5\' 1"  (1.549 m)   Wt 206 lb (93.4 kg)   SpO2 93%   BMI 38.92 kg/m    Physical Exam Vitals and nursing note reviewed.  Constitutional:      General: She is not in acute distress.    Appearance: She is well-developed. She is obese. She is not diaphoretic.  HENT:     Head: Normocephalic and atraumatic.     Mouth/Throat:     Pharynx: No oropharyngeal exudate.  Eyes:     Pupils: Pupils are equal, round, and reactive to light.  Neck:     Thyroid: No thyromegaly.     Vascular: No JVD.     Trachea: No tracheal deviation.  Cardiovascular:     Rate and Rhythm: Normal rate and regular rhythm.     Heart sounds: Normal heart sounds. No murmur heard.    No friction rub. No gallop.  Pulmonary:     Effort: Pulmonary effort is normal.     Breath sounds: No wheezing.  Abdominal:     General: Bowel sounds are normal.     Palpations: Abdomen is soft.  Musculoskeletal:        General: No tenderness. Normal range of motion.     Cervical back: Normal range of motion and neck supple.     Comments: Left knee without obvious deformity, non tender to palpation  Lymphadenopathy:     Cervical: No cervical adenopathy.  Skin:    General: Skin is warm and dry.  Neurological:     Mental Status: She is alert.     Cranial Nerves: No cranial nerve deficit.     Motor: Weakness present.     Gait: Gait abnormal.  Psychiatric:  Behavior: Behavior normal.        Thought Content: Thought content normal.        Judgment: Judgment normal.         Assessment/Plan: 1. Memory loss Will refer for neuropsychology evaluation and treatment - Ambulatory referral to Neuropsychology  2. Cognitive and behavioral changes Will refer for neuropsychology evaluation and treatment - Ambulatory referral to Neuropsychology  3. Insomnia, unspecified type Will start on 1/2 tab trazodone and discussed working on sleep hygiene as well and avoiding afternoon naps. Cautioned on use with other medications that can be sedating and will monitor response. - Ambulatory referral to Neuropsychology - traZODone (DESYREL) 50 MG tablet; Take 1/2 tablet by mouth before bed.  Dispense: 30 tablet; Refill: 1  4. Vitamin D deficiency - Vitamin D, Ergocalciferol, (DRISDOL) 1.25 MG (50000 UNIT) CAPS capsule; Take 1 capsule by mouth weekly  Dispense: 12 capsule; Refill: 3  5. ILD (interstitial lung disease) ([place]) Followed by pulmonology and will monitor sats  6. Dependence on supplemental oxygen Continue oxygen  7. Knee buckling, left [facility] to [facility] for [facility] evaluation and management   General Counseling: Sandy Salaam understanding of the findings of todays visit and agrees with plan of treatment. I have discussed any further diagnostic evaluation that may be needed or ordered today. We also reviewed her medications today. she has been encouraged to call the office with any questions or concerns that should arise related to todays visit.    Orders Placed This Encounter  Procedures   Ambulatory referral to Neuropsychology    Meds ordered this encounter  Medications   traZODone (DESYREL) 50 MG tablet    Sig: Take 1/2 tablet by mouth before bed.    Dispense:  30 tablet    Refill:  1   Vitamin D, Ergocalciferol, (DRISDOL) 1.25 MG (50000 UNIT) CAPS capsule    Sig: Take 1 capsule by mouth weekly    Dispense:  12 capsule    Refill:  3    This patient was seen by Drema Dallas, PA-C in collaboration with Dr. Clayborn Bigness as a part  of collaborative care agreement.   Total time spent:45 Minutes Time spent includes review of chart, medications, test results, and follow up plan with the patient.      Dr Lavera Guise Internal medicine

## 2022-11-08 NOTE — Telephone Encounter (Signed)
*  STAT* If patient is at the pharmacy, call can be transferred to refill team.   1. Which medications need to be refilled? (please list name of each medication and dose if known)  metoprolol succinate (TOPROL-XL) 50 MG 24 hr tablet   2. Which pharmacy/location (including street and city if local pharmacy) is medication to be sent to?  [facility], [place]    3. Do they need a 30 day or 90 day supply? [id number]   Pharmacy saying they don't have a prescription for the patient

## 2022-11-09 ENCOUNTER — Telehealth: Payer: Self-pay | Admitting: Physician Assistant

## 2022-11-09 MED ORDER — METOPROLOL SUCCINATE ER 50 MG PO TB24: ORAL_TABLET | ORAL | 3 refills | Status: DC

## 2022-11-09 NOTE — Telephone Encounter (Signed)
Awaiting 11/08/22 office notes for neuropsychology referral-[name] Douglas

## 2022-11-10 DIAGNOSIS — H524 Presbyopia: Secondary | ICD-10-CM | POA: Diagnosis not present

## 2022-11-13 ENCOUNTER — Telehealth: Payer: Self-pay | Admitting: Physician Assistant

## 2022-11-13 NOTE — Telephone Encounter (Signed)
Neuropsychology referral faxed to [facility] in [place] ; [phone]

## 2022-11-14 ENCOUNTER — Telehealth: Payer: Self-pay | Admitting: Physician Assistant

## 2022-11-14 NOTE — Telephone Encounter (Signed)
[place] does not accept patient's Medicaid. Neuropsychology referral faxed to [facility] in [place]; [phone]

## 2022-11-15 ENCOUNTER — Telehealth: Payer: Self-pay | Admitting: Physician Assistant

## 2022-11-15 NOTE — Telephone Encounter (Signed)
Company secretary in [place] does not accept patient's Medicaid. Neuropsychology referral faxed to [facility]; [phone]

## 2022-11-20 ENCOUNTER — Telehealth: Payer: Self-pay | Admitting: Physician Assistant

## 2022-11-21 NOTE — Telephone Encounter (Signed)
Error

## 2022-11-29 ENCOUNTER — Telehealth: Payer: Self-pay | Admitting: Physician Assistant

## 2022-11-29 NOTE — Telephone Encounter (Signed)
Received call from daughter. [facility] stated they had not received referral. Per Darl Pikes with office, she sees referral in [facility] fax folder. I lvm to let daughter know-Toni

## 2022-12-05 DIAGNOSIS — Z961 Presence of intraocular lens: Secondary | ICD-10-CM | POA: Diagnosis not present

## 2022-12-05 DIAGNOSIS — H353131 Nonexudative age-related macular degeneration, bilateral, early dry stage: Secondary | ICD-10-CM | POA: Diagnosis not present

## 2022-12-05 DIAGNOSIS — H59099 Other disorders of unspecified eye following cataract surgery: Secondary | ICD-10-CM | POA: Diagnosis not present

## 2022-12-07 DIAGNOSIS — M189 Osteoarthritis of first carpometacarpal joint, unspecified: Secondary | ICD-10-CM | POA: Diagnosis not present

## 2022-12-07 DIAGNOSIS — M25562 Pain in left knee: Secondary | ICD-10-CM | POA: Diagnosis not present

## 2022-12-10 ENCOUNTER — Other Ambulatory Visit: Payer: Self-pay | Admitting: Physician Assistant

## 2022-12-10 DIAGNOSIS — E119 Type 2 diabetes mellitus without complications: Secondary | ICD-10-CM

## 2022-12-10 DIAGNOSIS — R5383 Other fatigue: Secondary | ICD-10-CM

## 2022-12-12 DIAGNOSIS — Z8673 Personal history of transient ischemic attack (TIA), and cerebral infarction without residual deficits: Secondary | ICD-10-CM | POA: Diagnosis not present

## 2022-12-12 DIAGNOSIS — I10 Essential (primary) hypertension: Secondary | ICD-10-CM | POA: Diagnosis not present

## 2022-12-12 DIAGNOSIS — R531 Weakness: Secondary | ICD-10-CM | POA: Diagnosis not present

## 2022-12-12 DIAGNOSIS — M189 Osteoarthritis of first carpometacarpal joint, unspecified: Secondary | ICD-10-CM | POA: Diagnosis not present

## 2022-12-12 DIAGNOSIS — M25562 Pain in left knee: Secondary | ICD-10-CM | POA: Diagnosis not present

## 2022-12-12 DIAGNOSIS — Z7901 Long term (current) use of anticoagulants: Secondary | ICD-10-CM | POA: Diagnosis not present

## 2022-12-12 DIAGNOSIS — Z9181 History of falling: Secondary | ICD-10-CM | POA: Diagnosis not present

## 2022-12-12 DIAGNOSIS — E785 Hyperlipidemia, unspecified: Secondary | ICD-10-CM | POA: Diagnosis not present

## 2022-12-12 DIAGNOSIS — E119 Type 2 diabetes mellitus without complications: Secondary | ICD-10-CM | POA: Diagnosis not present

## 2022-12-12 DIAGNOSIS — Z7984 Long term (current) use of oral hypoglycemic drugs: Secondary | ICD-10-CM | POA: Diagnosis not present

## 2022-12-12 DIAGNOSIS — F419 Anxiety disorder, unspecified: Secondary | ICD-10-CM | POA: Diagnosis not present

## 2022-12-12 DIAGNOSIS — G8314 Monoplegia of lower limb affecting left nondominant side: Secondary | ICD-10-CM | POA: Diagnosis not present

## 2022-12-14 DIAGNOSIS — H26491 Other secondary cataract, right eye: Secondary | ICD-10-CM | POA: Diagnosis not present

## 2022-12-18 ENCOUNTER — Emergency Department: Payer: Medicare Other

## 2022-12-18 ENCOUNTER — Emergency Department
Admission: EM | Admit: 2022-12-18 | Discharge: 2022-12-18 | Disposition: A | Discharge status: Home / Self Care | Payer: Medicare Other | Attending: Emergency Medicine | Admitting: Emergency Medicine

## 2022-12-18 DIAGNOSIS — M549 Dorsalgia, unspecified: Secondary | ICD-10-CM | POA: Diagnosis not present

## 2022-12-18 DIAGNOSIS — M189 Osteoarthritis of first carpometacarpal joint, unspecified: Secondary | ICD-10-CM | POA: Diagnosis not present

## 2022-12-18 DIAGNOSIS — Z7901 Long term (current) use of anticoagulants: Secondary | ICD-10-CM | POA: Insufficient documentation

## 2022-12-18 DIAGNOSIS — R531 Weakness: Secondary | ICD-10-CM | POA: Diagnosis not present

## 2022-12-18 DIAGNOSIS — T1490XA Injury, unspecified, initial encounter: Secondary | ICD-10-CM | POA: Diagnosis not present

## 2022-12-18 DIAGNOSIS — W19XXXA Unspecified fall, initial encounter: Secondary | ICD-10-CM

## 2022-12-18 DIAGNOSIS — G8314 Monoplegia of lower limb affecting left nondominant side: Secondary | ICD-10-CM | POA: Diagnosis not present

## 2022-12-18 DIAGNOSIS — M542 Cervicalgia: Secondary | ICD-10-CM | POA: Diagnosis not present

## 2022-12-18 DIAGNOSIS — R519 Headache, unspecified: Secondary | ICD-10-CM | POA: Diagnosis not present

## 2022-12-18 DIAGNOSIS — I10 Essential (primary) hypertension: Secondary | ICD-10-CM | POA: Insufficient documentation

## 2022-12-18 DIAGNOSIS — W010XXA Fall on same level from slipping, tripping and stumbling without subsequent striking against object, initial encounter: Secondary | ICD-10-CM | POA: Insufficient documentation

## 2022-12-18 DIAGNOSIS — M25562 Pain in left knee: Secondary | ICD-10-CM | POA: Diagnosis not present

## 2022-12-18 DIAGNOSIS — M545 Low back pain, unspecified: Secondary | ICD-10-CM | POA: Diagnosis not present

## 2022-12-18 DIAGNOSIS — M25522 Pain in left elbow: Secondary | ICD-10-CM | POA: Diagnosis not present

## 2022-12-18 DIAGNOSIS — I959 Hypotension, unspecified: Secondary | ICD-10-CM | POA: Diagnosis not present

## 2022-12-18 DIAGNOSIS — R079 Chest pain, unspecified: Secondary | ICD-10-CM | POA: Diagnosis not present

## 2022-12-18 DIAGNOSIS — E119 Type 2 diabetes mellitus without complications: Secondary | ICD-10-CM | POA: Diagnosis not present

## 2022-12-18 LAB — BASIC METABOLIC PANEL
Anion gap: 7 (ref 5–15)
BUN: 14 mg/dL (ref 8–23)
CO2: 27 mmol/L (ref 22–32)
Calcium: 8.6 mg/dL — ABNORMAL LOW (ref 8.9–10.3)
Chloride: 104 mmol/L (ref 98–111)
Creatinine, Ser: 0.77 mg/dL (ref 0.44–1.00)
GFR, Estimated: >60 mL/min (ref >60)
Glucose, Bld: 95 mg/dL (ref 70–99)
Potassium: 3.7 mmol/L (ref 3.5–5.1)
Sodium: 138 mmol/L (ref 135–145)

## 2022-12-18 LAB — CBC WITH DIFFERENTIAL/PLATELET
Abs Immature Granulocytes: 0.02 10*3/uL (ref 0.00–0.07)
Basophils Absolute: 0.1 10*3/uL (ref 0.0–0.1)
Basophils Relative: 1 %
Eosinophils Absolute: 0.5 10*3/uL (ref 0.0–0.5)
Eosinophils Relative: 7 %
HCT: 29.7 % — ABNORMAL LOW (ref 36.0–46.0)
Hemoglobin: 8.9 g/dL — ABNORMAL LOW (ref 12.0–15.0)
Immature Granulocytes: 0 %
Lymphocytes Relative: 20 %
Lymphs Abs: 1.5 10*3/uL (ref 0.7–4.0)
MCH: 24.7 pg — ABNORMAL LOW (ref 26.0–34.0)
MCHC: 30 g/dL (ref 30.0–36.0)
MCV: 82.5 fL (ref 80.0–100.0)
Monocytes Absolute: 0.6 10*3/uL (ref 0.1–1.0)
Monocytes Relative: 8 %
Neutro Abs: 4.9 10*3/uL (ref 1.7–7.7)
Neutrophils Relative %: 64 %
Platelets: 301 10*3/uL (ref 150–400)
RBC: 3.6 MIL/uL — ABNORMAL LOW (ref 3.87–5.11)
RDW: 15.5 % (ref 11.5–15.5)
WBC: 7.7 10*3/uL (ref 4.0–10.5)
nRBC: 0 % (ref 0.0–0.2)

## 2022-12-18 LAB — URINALYSIS, ROUTINE W REFLEX MICROSCOPIC
Bilirubin Urine: NEGATIVE
Glucose, UA: 50 mg/dL — AB
Hgb urine dipstick: NEGATIVE
Ketones, ur: NEGATIVE mg/dL
Leukocytes,Ua: NEGATIVE
Nitrite: NEGATIVE
Protein, ur: NEGATIVE mg/dL
Specific Gravity, Urine: 1.01 (ref 1.005–1.030)
pH: 6 (ref 5.0–8.0)

## 2022-12-18 LAB — PROTIME-INR
INR: 1.4 — ABNORMAL HIGH (ref 0.8–1.2)
Prothrombin Time: 16.7 seconds — ABNORMAL HIGH (ref 11.4–15.2)

## 2022-12-18 MED ORDER — OXYCODONE HCL 5 MG PO TABS
5.0000 mg | ORAL_TABLET | Freq: Once | ORAL | Status: AC
  Administered 2022-12-18: 5 mg via ORAL
  Filled 2022-12-18: qty 1

## 2022-12-18 MED ORDER — OXYCODONE HCL 5 MG PO TABS: 2.5000 mg | ORAL_TABLET | Freq: Four times a day (QID) | ORAL | 0 refills | Status: DC | PRN

## 2022-12-18 MED ORDER — ONDANSETRON 4 MG PO TBDP
4.0000 mg | ORAL_TABLET | Freq: Once | ORAL | Status: AC
  Administered 2022-12-18: 4 mg via ORAL
  Filled 2022-12-18: qty 1

## 2022-12-18 MED ORDER — ACETAMINOPHEN 500 MG PO TABS
1000.0000 mg | ORAL_TABLET | Freq: Once | ORAL | Status: AC
  Administered 2022-12-18: 1000 mg via ORAL
  Filled 2022-12-18: qty 2

## 2022-12-18 NOTE — ED Provider Notes (Signed)
[facility] Provider Note    Event Date/Time   First MD Initiated Contact with Patient 12/18/22 [phone]     (approximate)   History   Fall   HPI  [name] Douglas is a 82 y.o. female  with h/o falls, DM, HTN, HLD, chronic hypoxia, here with fall and back pain, left elbow pain. Pt reports she fell while getting into the bathroom earlier. She believes her walker got caught up. She has had aching, throbbing lower back pain and left elbow pain since then. H/o surgery on the back. No LE weakness, numbness. She says she was feeling fine prior to the fall. No urinary sx. No flank pain. No cough or SOB.       Physical Exam   Triage Vital Signs: ED Triage Vitals  Enc Vitals Group     BP 12/18/22 0047 (!) 133/55     Pulse Rate 12/18/22 0047 64     Resp 12/18/22 0047 18     Temp 12/18/22 0047 97.8 F (36.6 C)     Temp src --      SpO2 12/18/22 0047 96 %     Weight 12/18/22 0047 220 lb (99.8 kg)     Height 12/18/22 0047 5\' 4"  (1.626 m)     Head Circumference --      Peak Flow --      Pain Score 12/18/22 0046 6     Pain Loc --      Pain Edu? --      Excl. in GC? --     Most recent vital signs: Vitals:   12/18/22 0100 12/18/22 0300  BP: 101/89 (!) 160/47  Pulse: 67 64  Resp: 17 17  Temp:    SpO2: 100% 100%     General: Awake, no distress.  CV:  Good peripheral perfusion. RRR. No murmurs. Resp:  Normal work of breathing. Lungs clear bilaterally. Abd:  No distention. No tenderness. Other:  MIld TTP over L elbow with slight bruising. TTP over midline lower lumbar back, no bruising or deformity. Strength 5/5 bl LE and normal sensation to light touch.   ED Results / Procedures / Treatments   Labs (all labs ordered are listed, but only abnormal results are displayed) Labs Reviewed  CBC WITH DIFFERENTIAL/PLATELET - Abnormal; Notable for the following components:      Result Value   RBC 3.60 (*)    Hemoglobin 8.9 (*)    HCT 29.7 (*)    MCH 24.7  (*)    All other components within normal limits  BASIC METABOLIC PANEL - Abnormal; Notable for the following components:   Calcium 8.6 (*)    All other components within normal limits  PROTIME-INR - Abnormal; Notable for the following components:   Prothrombin Time 16.7 (*)    INR 1.4 (*)    All other components within normal limits  URINALYSIS, ROUTINE W REFLEX MICROSCOPIC - Abnormal; Notable for the following components:   Color, Urine YELLOW (*)    APPearance CLEAR (*)    Glucose, UA 50 (*)    All other components within normal limits     EKG Normal sinus rhyuthm, VR 67. PR 199, QRS 101, QTc 499. No acute st elevations or depressions. No ischemia or infarct.   RADIOLOGY CT L Spine: No acute fx or malalignment, moderate stenosis at L2-3 but no fx CT Head/ C  Spine: NAICA, no acute fx CXR: Clear DG Elbow L: Negative  I also independently  reviewed and agree with radiologist interpretations.   PROCEDURES:  Critical Care performed: No   MEDICATIONS ORDERED IN ED: Medications  acetaminophen (TYLENOL) tablet 1,000 mg (1,000 mg Oral Given 12/18/22 0302)  oxyCODONE (Oxy IR/ROXICODONE) immediate release tablet 5 mg (5 mg Oral Given 12/18/22 0302)  ondansetron (ZOFRAN-ODT) disintegrating tablet 4 mg (4 mg Oral Given 12/18/22 0302)     IMPRESSION / MDM / ASSESSMENT AND PLAN / ED COURSE  I reviewed the triage vital signs and the nursing notes.                              Differential diagnosis includes, but is not limited to, mechanical fall, fall from general medical condition (anemia, dehydration, UTI, PNA), elbow contusion or fx, lumbar fx, radiculopathy  Patient's presentation is most consistent with acute presentation with potential threat to life or bodily function.  The patient is on the cardiac monitor to evaluate for evidence of arrhythmia and/or significant heart rate changes  82 yo F here with mechanical fall and back/elbow pain. Imaging reviewed as above. Ct l  spine shows no fx and pt has no new radicular sx or signs of cauda equina or occult cord injury. Imaging of elbow negative. No injury on CT Head/ C Spine. Pt is otherwise at her baseline state of health. Cbc without leukocytosis. UA negative. Breathing is at baseline, CXR is clear. BMP unremarkable.   Pt pain is controlled with tylenol, oxycodone here and able to ambulate without difficulty. Will d/c with brief course of analgesia to maintain mobility and close outpt follow-up.    FINAL CLINICAL IMPRESSION(S) / ED DIAGNOSES   Final diagnoses:  Fall, initial encounter  Acute midline low back pain without sciatica     Rx / DC Orders   ED Discharge Orders          Ordered    oxyCODONE (ROXICODONE) 5 MG immediate release tablet  Every 6 hours PRN        12/18/22 0424             Note:  This document was prepared using Dragon voice recognition software and may include unintentional dictation errors.   Shaune Pollack, MD 12/18/22 [phone]

## 2022-12-18 NOTE — ED Notes (Signed)
Pt ambulated independently with walker around room.

## 2022-12-18 NOTE — ED Triage Notes (Signed)
Pt to ED via EMS from home, pt was walking backwards with walker into bathroom, tripped and fell. Pt c/o left shoulder and elbow pain and lower back pain. Pt takes eliquis, denies hitting head or loc.

## 2022-12-24 ENCOUNTER — Encounter (HOSPITAL_COMMUNITY): Payer: Self-pay

## 2022-12-24 ENCOUNTER — Emergency Department (HOSPITAL_COMMUNITY): Payer: Medicare Other

## 2022-12-24 ENCOUNTER — Ambulatory Visit: Payer: Medicare Other | Admitting: Physician Assistant

## 2022-12-24 ENCOUNTER — Emergency Department (HOSPITAL_COMMUNITY)
Admission: EM | Admit: 2022-12-24 | Discharge: 2022-12-24 | Disposition: A | Discharge status: Home / Self Care | Payer: Medicare Other | Attending: Emergency Medicine | Admitting: Emergency Medicine

## 2022-12-24 ENCOUNTER — Other Ambulatory Visit: Payer: Self-pay

## 2022-12-24 DIAGNOSIS — M545 Low back pain, unspecified: Secondary | ICD-10-CM | POA: Diagnosis not present

## 2022-12-24 DIAGNOSIS — Z1152 Encounter for screening for COVID-19: Secondary | ICD-10-CM | POA: Insufficient documentation

## 2022-12-24 DIAGNOSIS — Z79899 Other long term (current) drug therapy: Secondary | ICD-10-CM | POA: Insufficient documentation

## 2022-12-24 DIAGNOSIS — G8929 Other chronic pain: Secondary | ICD-10-CM | POA: Diagnosis not present

## 2022-12-24 DIAGNOSIS — I10 Essential (primary) hypertension: Secondary | ICD-10-CM | POA: Insufficient documentation

## 2022-12-24 DIAGNOSIS — Z7901 Long term (current) use of anticoagulants: Secondary | ICD-10-CM | POA: Diagnosis not present

## 2022-12-24 DIAGNOSIS — R0602 Shortness of breath: Secondary | ICD-10-CM | POA: Diagnosis not present

## 2022-12-24 DIAGNOSIS — Z6836 Body mass index (BMI) 36.0-36.9, adult: Secondary | ICD-10-CM | POA: Diagnosis not present

## 2022-12-24 DIAGNOSIS — J189 Pneumonia, unspecified organism: Secondary | ICD-10-CM

## 2022-12-24 DIAGNOSIS — J168 Pneumonia due to other specified infectious organisms: Secondary | ICD-10-CM | POA: Diagnosis not present

## 2022-12-24 DIAGNOSIS — R202 Paresthesia of skin: Secondary | ICD-10-CM | POA: Diagnosis not present

## 2022-12-24 DIAGNOSIS — E876 Hypokalemia: Secondary | ICD-10-CM | POA: Diagnosis not present

## 2022-12-24 DIAGNOSIS — E119 Type 2 diabetes mellitus without complications: Secondary | ICD-10-CM | POA: Insufficient documentation

## 2022-12-24 DIAGNOSIS — R0689 Other abnormalities of breathing: Secondary | ICD-10-CM | POA: Diagnosis not present

## 2022-12-24 DIAGNOSIS — R457 State of emotional shock and stress, unspecified: Secondary | ICD-10-CM | POA: Diagnosis not present

## 2022-12-24 DIAGNOSIS — M48062 Spinal stenosis, lumbar region with neurogenic claudication: Secondary | ICD-10-CM | POA: Diagnosis not present

## 2022-12-24 DIAGNOSIS — W19XXXA Unspecified fall, initial encounter: Secondary | ICD-10-CM | POA: Diagnosis not present

## 2022-12-24 LAB — COMPREHENSIVE METABOLIC PANEL
ALT: 13 U/L (ref 0–44)
AST: 19 U/L (ref 15–41)
Albumin: 3.5 g/dL (ref 3.5–5.0)
Alkaline Phosphatase: 86 U/L (ref 38–126)
Anion gap: 12 (ref 5–15)
BUN: 10 mg/dL (ref 8–23)
CO2: 25 mmol/L (ref 22–32)
Calcium: 9 mg/dL (ref 8.9–10.3)
Chloride: 103 mmol/L (ref 98–111)
Creatinine, Ser: 0.83 mg/dL (ref 0.44–1.00)
GFR, Estimated: >60 mL/min (ref >60)
Glucose, Bld: 115 mg/dL — ABNORMAL HIGH (ref 70–99)
Potassium: 3.1 mmol/L — ABNORMAL LOW (ref 3.5–5.1)
Sodium: 140 mmol/L (ref 135–145)
Total Bilirubin: 0.5 mg/dL (ref 0.3–1.2)
Total Protein: 6.1 g/dL — ABNORMAL LOW (ref 6.5–8.1)

## 2022-12-24 LAB — I-STAT VENOUS BLOOD GAS, ED
Acid-Base Excess: 3 mmol/L — ABNORMAL HIGH (ref 0.0–2.0)
Bicarbonate: 25.8 mmol/L (ref 20.0–28.0)
Calcium, Ion: 1.1 mmol/L — ABNORMAL LOW (ref 1.15–1.40)
HCT: 28 % — ABNORMAL LOW (ref 36.0–46.0)
Hemoglobin: 9.5 g/dL — ABNORMAL LOW (ref 12.0–15.0)
O2 Saturation: 73 %
Potassium: 3.2 mmol/L — ABNORMAL LOW (ref 3.5–5.1)
Sodium: 140 mmol/L (ref 135–145)
TCO2: 27 mmol/L (ref 22–32)
pCO2, Ven: 31.6 mmHg — ABNORMAL LOW (ref 44–60)
pH, Ven: 7.519 — ABNORMAL HIGH (ref 7.25–7.43)
pO2, Ven: 34 mmHg (ref 32–45)

## 2022-12-24 LAB — CBC WITH DIFFERENTIAL/PLATELET
Abs Immature Granulocytes: 0.04 10*3/uL (ref 0.00–0.07)
Basophils Absolute: 0.1 10*3/uL (ref 0.0–0.1)
Basophils Relative: 1 %
Eosinophils Absolute: 0.2 10*3/uL (ref 0.0–0.5)
Eosinophils Relative: 2 %
HCT: 28.7 % — ABNORMAL LOW (ref 36.0–46.0)
Hemoglobin: 8.8 g/dL — ABNORMAL LOW (ref 12.0–15.0)
Immature Granulocytes: 0 %
Lymphocytes Relative: 12 %
Lymphs Abs: 1.1 10*3/uL (ref 0.7–4.0)
MCH: 24.9 pg — ABNORMAL LOW (ref 26.0–34.0)
MCHC: 30.7 g/dL (ref 30.0–36.0)
MCV: 81.3 fL (ref 80.0–100.0)
Monocytes Absolute: 0.6 10*3/uL (ref 0.1–1.0)
Monocytes Relative: 7 %
Neutro Abs: 7 10*3/uL (ref 1.7–7.7)
Neutrophils Relative %: 78 %
Platelets: 312 10*3/uL (ref 150–400)
RBC: 3.53 MIL/uL — ABNORMAL LOW (ref 3.87–5.11)
RDW: 15.9 % — ABNORMAL HIGH (ref 11.5–15.5)
WBC: 9 10*3/uL (ref 4.0–10.5)
nRBC: 0 % (ref 0.0–0.2)

## 2022-12-24 LAB — TROPONIN I (HIGH SENSITIVITY)
Troponin I (High Sensitivity): 12 ng/L (ref <18)
Troponin I (High Sensitivity): 11 ng/L (ref <18)

## 2022-12-24 LAB — RESP PANEL BY RT-PCR (RSV, FLU A&B, COVID)  RVPGX2
Influenza A by PCR: NEGATIVE
Influenza B by PCR: NEGATIVE
Resp Syncytial Virus by PCR: NEGATIVE
SARS Coronavirus 2 by RT PCR: NEGATIVE

## 2022-12-24 LAB — BRAIN NATRIURETIC PEPTIDE: B Natriuretic Peptide: 125.1 pg/mL — ABNORMAL HIGH (ref 0.0–100.0)

## 2022-12-24 LAB — MAGNESIUM: Magnesium: 1.8 mg/dL (ref 1.7–2.4)

## 2022-12-24 MED ORDER — ACETAMINOPHEN 325 MG PO TABS
650.0000 mg | ORAL_TABLET | Freq: Once | ORAL | Status: AC
  Administered 2022-12-24: 650 mg via ORAL
  Filled 2022-12-24: qty 2

## 2022-12-24 MED ORDER — TIZANIDINE HCL 4 MG PO TABS: 2.0000 mg | ORAL_TABLET | Freq: Two times a day (BID) | ORAL | 0 refills | Status: DC | PRN

## 2022-12-24 MED ORDER — CEFDINIR 300 MG PO CAPS: 300.0000 mg | ORAL_CAPSULE | Freq: Two times a day (BID) | ORAL | 0 refills | Status: AC

## 2022-12-24 MED ORDER — POTASSIUM CHLORIDE 20 MEQ PO PACK
40.0000 meq | PACK | Freq: Once | ORAL | Status: AC
  Administered 2022-12-24: 40 meq via ORAL
  Filled 2022-12-24: qty 2

## 2022-12-24 MED ORDER — NAPROXEN 250 MG PO TABS
250.0000 mg | ORAL_TABLET | Freq: Once | ORAL | Status: AC
  Administered 2022-12-24: 250 mg via ORAL
  Filled 2022-12-24: qty 1

## 2022-12-24 MED ORDER — LIDOCAINE 5 % EX PTCH
1.0000 | MEDICATED_PATCH | CUTANEOUS | Status: DC
  Administered 2022-12-24: 1 via TRANSDERMAL
  Filled 2022-12-24: qty 1

## 2022-12-24 MED ORDER — TIZANIDINE HCL 4 MG PO TABS
4.0000 mg | ORAL_TABLET | Freq: Once | ORAL | Status: AC
  Administered 2022-12-24: 4 mg via ORAL
  Filled 2022-12-24: qty 1

## 2022-12-24 NOTE — ED Provider Notes (Signed)
[place] EMERGENCY DEPARTMENT AT [facility] Provider Note   CSN: [id number] Arrival date & time: 12/24/22  1421     History  Chief Complaint  Patient presents with   Hyperventilating    [name] Douglas is a 82 y.o. female. 82 year old female with history of diabetes, hypertension, hyperlipidemia, chronic hypoxia on baseline 4 L presenting for shortness of breath, fatigue.  Patient is here with her daughter and family member.  She reports fall about a week ago, frequently has falls.  She was seen in outside hospital ED.  At that time she had a CT of her head, cervical spine, lumbar spine which showed moderate spinal canal stenosis and some foraminal stenosis but no acute fracture.  She followed up with her doctor today.  There she had worsening trouble breathing.  She had noted shortness of breath for the last few days as well as fatigue.  Denies any chest pain, dizziness, syncope.  No falls since her most recent ED visit.  She lives alone, she does have regular help from family.  They would like her to move in with them but she prefers to stay alone.  Family is concerned about her safety.  She has baseline numbness in her fingers and toes intermittently without acute change.  She is being drinking normally, no vomiting or diarrhea.  No abdominal pain.  She reports history of prior stroke and is on Eliquis.  She missed her dose today but otherwise is very good about taking her doses.  Denies history of blood clots, no lower extremity edema.  The history is provided by the patient and a relative.       Home Medications Prior to Admission medications   Medication Sig Start Date End Date Taking? Authorizing Provider  amLODipine (NORVASC) 5 MG tablet TAKE 1 TABLET BY MOUTH DAILY Patient taking differently: Take 5 mg by mouth daily. 09/19/22   McDonough, Salomon Fick, PA-C  apixaban (ELIQUIS) 5 MG TABS tablet Take 1 tablet (5 mg total) by mouth 2 (two) times daily. 03/05/22   Duke Salvia, MD  atorvastatin (LIPITOR) 80 MG tablet TAKE 1 TABLET BY MOUTH DAILY AT 6PM Patient taking differently: Take 80 mg by mouth See admin instructions. TAKE 1 TABLET BY MOUTH DAILY AT 6PM 08/03/22   McDonough, Lauren K, PA-C  benzonatate (TESSALON) 100 MG capsule Take 1 capsule (100 mg total) by mouth 2 (two) times daily as needed for cough. 08/08/22   Sallyanne Kuster, NP  donepezil (ARICEPT) 10 MG tablet Take 1 tablet (10 mg total) by mouth at bedtime. 10/16/22   McDonough, Salomon Fick, PA-C  flecainide (TAMBOCOR) 50 MG tablet Take 1 tablet (50 mg total) by mouth 2 (two) times daily. 03/05/22   Duke Salvia, MD  furosemide (LASIX) 20 MG tablet Take 1 tablet (20 mg total) by mouth every other day. 03/05/22   Duke Salvia, MD  JARDIANCE 10 MG TABS tablet TAKE 1 TABLET BY MOUTH DAILY BEFORE BREAKFAST 12/10/22   McDonough, Salomon Fick, PA-C  levothyroxine (SYNTHROID) 88 MCG tablet TAKE 1 TABLET BY MOUTH DAILY 05/09/22   McDonough, Salomon Fick, PA-C  metoprolol succinate (TOPROL-XL) 50 MG 24 hr tablet TAKE 1 TABLETS BY MOUTH DAILY. TAKE WITH OR IMMEDIATELY FOLLOWING A MEAL 11/09/22   Duke Salvia, MD  oxyCODONE (ROXICODONE) 5 MG immediate release tablet Take 0.5 tablets (2.5 mg total) by mouth every 6 (six) hours as needed for severe pain. 12/18/22 12/18/23  Shaune Pollack, MD  traZODone (DESYREL) 50 MG tablet Take 1/2 tablet by mouth before bed. Patient taking differently: Take 25 mg by mouth at bedtime. 11/08/22   McDonough, Salomon Fick, PA-C  venlafaxine XR (EFFEXOR-XR) 75 MG 24 hr capsule TAKE 2 CAPSULES BY MOUTH DAILY Patient taking differently: Take 150 mg by mouth daily with breakfast. 09/05/22   McDonough, Salomon Fick, PA-C  Vitamin D, Ergocalciferol, (DRISDOL) 1.25 MG (50000 UNIT) CAPS capsule Take 1 capsule by mouth weekly 11/08/22   McDonough, Salomon Fick, PA-C      Allergies    Heparin    Review of Systems   Review of Systems  Constitutional:  Positive for fatigue.  Respiratory:  Positive for  shortness of breath.     Physical Exam Updated Vital Signs BP (!) 170/56   Pulse 86   Temp 98 F (36.7 C) (Oral)   Resp 18   Ht 5\' 4"  (1.626 m)   Wt 97.1 kg   SpO2 95%   BMI 36.73 kg/m  Physical Exam Vitals and nursing note reviewed.  Constitutional:      General: She is not in acute distress.    Appearance: She is well-developed.  HENT:     Head: Normocephalic and atraumatic.  Eyes:     Conjunctiva/sclera: Conjunctivae normal.  Cardiovascular:     Rate and Rhythm: Normal rate and regular rhythm.     Heart sounds: No murmur heard. Pulmonary:     Effort: No respiratory distress.     Breath sounds: Normal breath sounds.     Comments: Mild intermittent tachypnea. Abdominal:     Palpations: Abdomen is soft.     Tenderness: There is no abdominal tenderness. There is no guarding or rebound.  Musculoskeletal:        General: No swelling.     Cervical back: Neck supple.     Right lower leg: No edema.     Left lower leg: No edema.  Skin:    General: Skin is warm and dry.     Capillary Refill: Capillary refill takes less than 2 seconds.  Neurological:     General: No focal deficit present.     Mental Status: She is alert and oriented to person, place, and time.  Psychiatric:        Mood and Affect: Mood normal.     ED Results / Procedures / Treatments   Labs (all labs ordered are listed, but only abnormal results are displayed) Labs Reviewed  CBC WITH DIFFERENTIAL/PLATELET - Abnormal; Notable for the following components:      Result Value   RBC 3.53 (*)    Hemoglobin 8.8 (*)    HCT 28.7 (*)    MCH 24.9 (*)    RDW 15.9 (*)    All other components within normal limits  BRAIN NATRIURETIC PEPTIDE - Abnormal; Notable for the following components:   B Natriuretic Peptide 125.1 (*)    All other components within normal limits  COMPREHENSIVE METABOLIC PANEL - Abnormal; Notable for the following components:   Potassium 3.1 (*)    Glucose, Bld 115 (*)    Total Protein  6.1 (*)    All other components within normal limits  I-STAT VENOUS BLOOD GAS, ED - Abnormal; Notable for the following components:   pH, Ven 7.519 (*)    pCO2, Ven 31.6 (*)    Acid-Base Excess 3.0 (*)    Potassium 3.2 (*)    Calcium, Ion 1.10 (*)    HCT 28.0 (*)  Hemoglobin 9.5 (*)    All other components within normal limits  RESP PANEL BY RT-PCR (RSV, FLU A&B, COVID)  RVPGX2  MAGNESIUM  TROPONIN I (HIGH SENSITIVITY)  TROPONIN I (HIGH SENSITIVITY)    EKG EKG Interpretation  Date/Time:  Monday Dec 24 2022 14:29:27 EDT Ventricular Rate:  86 PR Interval:  169 QRS Duration: 86 QT Interval:  401 QTC Calculation: 480 R Axis:   30 Text Interpretation: Sinus rhythm Low voltage, precordial leads Minimal ST depression, lateral leads Confirmed by Virgina Norfolk ([id number]) on 12/24/2022 2:32:50 PM  Radiology DG Chest 2 View  Result Date: 12/24/2022 CLINICAL DATA:  Shortness of breath EXAM: CHEST - 2 VIEW COMPARISON:  CXR 12/18/22 FINDINGS: No pleural effusion. No pneumothorax. There is a hazy opacity in the right lung base, which could represent atelectasis or infection. Unchanged cardiac and mediastinal contours. No radiographically apparent displaced rib fractures. Visualized upper abdomen is unremarkable. Vertebral body heights are maintained. Aortic atherosclerotic calcifications. IMPRESSION: Hazy opacity at the right lung base could represent atelectasis or infection. Electronically Signed   By: Lorenza Cambridge M.D.   On: 12/24/2022 16:01    Procedures Procedures    Medications Ordered in ED Medications  lidocaine (LIDODERM) 5 % 1 patch (1 patch Transdermal Patch Applied 12/24/22 1726)  tiZANidine (ZANAFLEX) tablet 4 mg (4 mg Oral Given 12/24/22 1726)  acetaminophen (TYLENOL) tablet 650 mg (650 mg Oral Given 12/24/22 1726)  naproxen (NAPROSYN) tablet 250 mg (250 mg Oral Given 12/24/22 1726)  potassium chloride (KLOR-CON) packet 40 mEq (40 mEq Oral Given 12/24/22 1739)    ED Course/ Medical  Decision Making/ A&P                             Medical Decision Making Amount and/or Complexity of Data Reviewed Labs: ordered. Radiology: ordered.  Risk OTC drugs. Prescription drug management.   82 year old female presenting for several days shortness of breath, fatigue.  On exam she is afebrile, mildly hypertensive and intermittently mildly tachypneic.  She is on her baseline 4 L satting 100%.  Her lungs are clear on exam.  EKG shows sinus rhythm, rate 86, I do not see any significant ST or T wave normalities to suggest ischemia.  I suspect her symptoms could be related somewhat to anxiety, especially given history of similar.  She also has some muscle spasm in her back after recent fall, although had recent imaging without fracture or other acute findings.  Will obtain troponin, BNP, chest x-ray.  Low concern for PE, given no tachycardia, no hypoxia, and has been adherent to her Eliquis regimen without symptoms of DVT.  Lab work reviewed.  She has respiratory alkalosis likely related to her tachypnea.  Her tachypnea has resolved on repeat evaluation.  She is mildly elevated BNP similar to prior, but no signs of pulmonary edema or heart failure.  I reviewed her chest x-ray independently, she has possible right lower lobe pneumonia which could be contributing.  Her CBC is notable for anemia similar to prior.  CMP notable for mild hypokalemia which was repleted.  Troponin is negative x 2, no signs of ACS.  She was given tizanidine, naproxen, Tylenol, lidocaine patch for pain.  On repeat evaluation her pain is much better controlled.  Her tachypnea has resolved.  I suspect her breathing symptoms are related to her back pain.  Will plan to treat her pneumonia as well as start tizanidine at home to help with her  muscle spasms.  I recommended she follow-up with her PCP later this week for reevaluation and gave strict return precautions.  She was discharged home in stable  condition.        Final Clinical Impression(s) / ED Diagnoses Final diagnoses:  None    Rx / DC Orders ED Discharge Orders     None         Fulton Reek, MD 12/24/22 1901    Derwood Kaplan, MD 12/28/22 [phone]

## 2022-12-24 NOTE — ED Notes (Signed)
Patient transported to X-ray 

## 2022-12-24 NOTE — Discharge Instructions (Signed)
You were seen today for fast breathing and back pain.  Your x-ray is concerning for possible pneumonia, you are being treated for pneumonia with antibiotics and her started on a muscle relaxer to help with your pain.  Your potassium was slightly low.  It is importantly follow-up with your primary care provider later this week to recheck your potassium and reevaluate you.  If you develop difficulty breathing, chest pain, worsening pain you should return to the ED.

## 2022-12-24 NOTE — ED Triage Notes (Signed)
Pt brought in by EMS (lives at home) and today was at the neuro/spine center today for ongoing issues with numbness/tingling in bilateral feet. Before her appointment could begin pt was found to be tachypnic to the 30's on her home dose of 4L. Pt was never hypoxic and some anxiety was noted by EMS. Breathing had improved with EMS but is not back to baseline yet reported by family. Family reports that her breathing intermittently does this over the last several years.   Pt denies any new pain, reports chronic back pain. Denies SOB/cough but does report some ongoing generalized weakness x several months (6-12) with the bilateral numbness/tingling

## 2022-12-25 DIAGNOSIS — E119 Type 2 diabetes mellitus without complications: Secondary | ICD-10-CM | POA: Diagnosis not present

## 2022-12-25 DIAGNOSIS — I10 Essential (primary) hypertension: Secondary | ICD-10-CM | POA: Diagnosis not present

## 2022-12-25 DIAGNOSIS — M25562 Pain in left knee: Secondary | ICD-10-CM | POA: Diagnosis not present

## 2022-12-25 DIAGNOSIS — M189 Osteoarthritis of first carpometacarpal joint, unspecified: Secondary | ICD-10-CM | POA: Diagnosis not present

## 2022-12-25 DIAGNOSIS — R531 Weakness: Secondary | ICD-10-CM | POA: Diagnosis not present

## 2022-12-25 DIAGNOSIS — G8314 Monoplegia of lower limb affecting left nondominant side: Secondary | ICD-10-CM | POA: Diagnosis not present

## 2022-12-26 ENCOUNTER — Telehealth: Payer: Self-pay

## 2022-12-27 DIAGNOSIS — M25562 Pain in left knee: Secondary | ICD-10-CM | POA: Diagnosis not present

## 2022-12-27 DIAGNOSIS — I10 Essential (primary) hypertension: Secondary | ICD-10-CM | POA: Diagnosis not present

## 2022-12-27 DIAGNOSIS — E119 Type 2 diabetes mellitus without complications: Secondary | ICD-10-CM | POA: Diagnosis not present

## 2022-12-27 DIAGNOSIS — M189 Osteoarthritis of first carpometacarpal joint, unspecified: Secondary | ICD-10-CM | POA: Diagnosis not present

## 2022-12-27 DIAGNOSIS — G8314 Monoplegia of lower limb affecting left nondominant side: Secondary | ICD-10-CM | POA: Diagnosis not present

## 2022-12-27 DIAGNOSIS — R531 Weakness: Secondary | ICD-10-CM | POA: Diagnosis not present

## 2022-12-27 NOTE — Telephone Encounter (Signed)
Left message for patient to give me a call back.

## 2022-12-28 ENCOUNTER — Telehealth: Payer: Self-pay

## 2022-12-28 NOTE — Telephone Encounter (Signed)
Transition Care Management Unsuccessful Follow-up Telephone Call  Date of discharge and from where:  [facility] 4/[facility]  Attempts:  1st Attempt  Reason for unsuccessful TCM follow-up call:  Left voice message   Ramiro Pangilinan [facility], [place] [phone] [address], [place], [place] [address] Phone: [phone] Email: [email or web address]       

## 2022-12-28 NOTE — Telephone Encounter (Signed)
Transition Care Management Unsuccessful Follow-up Telephone Call  Date of discharge and from where:  [place] 4/30  Attempts:  2nd Attempt  Reason for unsuccessful TCM follow-up call:  Left voice message   Catrell Morrone [facility], [place] [phone] [address], [place], [place] [address] Phone: [phone] Email: [email or web address]       

## 2023-01-01 ENCOUNTER — Other Ambulatory Visit: Payer: Self-pay

## 2023-01-01 ENCOUNTER — Encounter: Payer: Self-pay | Admitting: Internal Medicine

## 2023-01-01 ENCOUNTER — Emergency Department: Payer: Medicare Other

## 2023-01-01 ENCOUNTER — Observation Stay
Admission: EM | Admit: 2023-01-01 | Discharge: 2023-01-02 | Disposition: A | Discharge status: Home / Self Care | Payer: Medicare Other | Attending: Student | Admitting: Student

## 2023-01-01 ENCOUNTER — Observation Stay: Payer: Medicare Other

## 2023-01-01 DIAGNOSIS — E11649 Type 2 diabetes mellitus with hypoglycemia without coma: Secondary | ICD-10-CM | POA: Insufficient documentation

## 2023-01-01 DIAGNOSIS — E559 Vitamin D deficiency, unspecified: Secondary | ICD-10-CM | POA: Diagnosis not present

## 2023-01-01 DIAGNOSIS — I480 Paroxysmal atrial fibrillation: Secondary | ICD-10-CM | POA: Diagnosis not present

## 2023-01-01 DIAGNOSIS — I110 Hypertensive heart disease with heart failure: Secondary | ICD-10-CM | POA: Diagnosis not present

## 2023-01-01 DIAGNOSIS — G9389 Other specified disorders of brain: Secondary | ICD-10-CM | POA: Diagnosis not present

## 2023-01-01 DIAGNOSIS — R531 Weakness: Secondary | ICD-10-CM

## 2023-01-01 DIAGNOSIS — J449 Chronic obstructive pulmonary disease, unspecified: Secondary | ICD-10-CM | POA: Diagnosis not present

## 2023-01-01 DIAGNOSIS — I6782 Cerebral ischemia: Principal | ICD-10-CM | POA: Insufficient documentation

## 2023-01-01 DIAGNOSIS — R000 Tachycardia, unspecified: Secondary | ICD-10-CM | POA: Diagnosis not present

## 2023-01-01 DIAGNOSIS — E611 Iron deficiency: Secondary | ICD-10-CM | POA: Insufficient documentation

## 2023-01-01 DIAGNOSIS — F17210 Nicotine dependence, cigarettes, uncomplicated: Secondary | ICD-10-CM | POA: Insufficient documentation

## 2023-01-01 DIAGNOSIS — I6521 Occlusion and stenosis of right carotid artery: Secondary | ICD-10-CM | POA: Diagnosis not present

## 2023-01-01 DIAGNOSIS — Z96653 Presence of artificial knee joint, bilateral: Secondary | ICD-10-CM | POA: Insufficient documentation

## 2023-01-01 DIAGNOSIS — I130 Hypertensive heart and chronic kidney disease with heart failure and stage 1 through stage 4 chronic kidney disease, or unspecified chronic kidney disease: Secondary | ICD-10-CM | POA: Insufficient documentation

## 2023-01-01 DIAGNOSIS — R4781 Slurred speech: Secondary | ICD-10-CM | POA: Diagnosis not present

## 2023-01-01 DIAGNOSIS — G459 Transient cerebral ischemic attack, unspecified: Secondary | ICD-10-CM | POA: Diagnosis present

## 2023-01-01 DIAGNOSIS — E039 Hypothyroidism, unspecified: Secondary | ICD-10-CM | POA: Diagnosis present

## 2023-01-01 DIAGNOSIS — I10 Essential (primary) hypertension: Secondary | ICD-10-CM | POA: Diagnosis not present

## 2023-01-01 DIAGNOSIS — Z720 Tobacco use: Secondary | ICD-10-CM | POA: Diagnosis present

## 2023-01-01 DIAGNOSIS — J45909 Unspecified asthma, uncomplicated: Secondary | ICD-10-CM | POA: Diagnosis not present

## 2023-01-01 DIAGNOSIS — I5032 Chronic diastolic (congestive) heart failure: Secondary | ICD-10-CM | POA: Insufficient documentation

## 2023-01-01 DIAGNOSIS — Z79899 Other long term (current) drug therapy: Secondary | ICD-10-CM | POA: Diagnosis not present

## 2023-01-01 DIAGNOSIS — Z95818 Presence of other cardiac implants and grafts: Secondary | ICD-10-CM | POA: Insufficient documentation

## 2023-01-01 DIAGNOSIS — R2681 Unsteadiness on feet: Secondary | ICD-10-CM | POA: Diagnosis not present

## 2023-01-01 DIAGNOSIS — R262 Difficulty in walking, not elsewhere classified: Secondary | ICD-10-CM | POA: Diagnosis not present

## 2023-01-01 DIAGNOSIS — E538 Deficiency of other specified B group vitamins: Secondary | ICD-10-CM | POA: Insufficient documentation

## 2023-01-01 DIAGNOSIS — F0390 Unspecified dementia without behavioral disturbance: Secondary | ICD-10-CM | POA: Insufficient documentation

## 2023-01-01 DIAGNOSIS — I5030 Unspecified diastolic (congestive) heart failure: Secondary | ICD-10-CM | POA: Insufficient documentation

## 2023-01-01 DIAGNOSIS — Z7901 Long term (current) use of anticoagulants: Secondary | ICD-10-CM | POA: Insufficient documentation

## 2023-01-01 DIAGNOSIS — Z7984 Long term (current) use of oral hypoglycemic drugs: Secondary | ICD-10-CM | POA: Insufficient documentation

## 2023-01-01 DIAGNOSIS — E785 Hyperlipidemia, unspecified: Secondary | ICD-10-CM | POA: Diagnosis present

## 2023-01-01 DIAGNOSIS — R29898 Other symptoms and signs involving the musculoskeletal system: Secondary | ICD-10-CM | POA: Insufficient documentation

## 2023-01-01 DIAGNOSIS — E162 Hypoglycemia, unspecified: Secondary | ICD-10-CM

## 2023-01-01 DIAGNOSIS — R059 Cough, unspecified: Secondary | ICD-10-CM | POA: Diagnosis not present

## 2023-01-01 DIAGNOSIS — Z8673 Personal history of transient ischemic attack (TIA), and cerebral infarction without residual deficits: Secondary | ICD-10-CM

## 2023-01-01 LAB — HEPATIC FUNCTION PANEL
ALT: 13 U/L (ref 0–44)
AST: 22 U/L (ref 15–41)
Albumin: 3.7 g/dL (ref 3.5–5.0)
Alkaline Phosphatase: 87 U/L (ref 38–126)
Bilirubin, Direct: <0.1 mg/dL (ref 0.0–0.2)
Total Bilirubin: 0.6 mg/dL (ref 0.3–1.2)
Total Protein: 6.5 g/dL (ref 6.5–8.1)

## 2023-01-01 LAB — CBG MONITORING, ED
Glucose-Capillary: 56 mg/dL — ABNORMAL LOW (ref 70–99)
Glucose-Capillary: 156 mg/dL — ABNORMAL HIGH (ref 70–99)
Glucose-Capillary: 85 mg/dL (ref 70–99)

## 2023-01-01 LAB — BASIC METABOLIC PANEL
Anion gap: 9 (ref 5–15)
BUN: 12 mg/dL (ref 8–23)
CO2: 26 mmol/L (ref 22–32)
Calcium: 9 mg/dL (ref 8.9–10.3)
Chloride: 102 mmol/L (ref 98–111)
Creatinine, Ser: 1.1 mg/dL — ABNORMAL HIGH (ref 0.44–1.00)
GFR, Estimated: 50 mL/min — ABNORMAL LOW (ref >60)
Glucose, Bld: 74 mg/dL (ref 70–99)
Potassium: 3.8 mmol/L (ref 3.5–5.1)
Sodium: 137 mmol/L (ref 135–145)

## 2023-01-01 LAB — URINALYSIS, ROUTINE W REFLEX MICROSCOPIC
Bilirubin Urine: NEGATIVE
Glucose, UA: >=500 mg/dL — AB
Hgb urine dipstick: NEGATIVE
Ketones, ur: NEGATIVE mg/dL
Leukocytes,Ua: NEGATIVE
Nitrite: NEGATIVE
Protein, ur: NEGATIVE mg/dL
Specific Gravity, Urine: 1.007 (ref 1.005–1.030)
pH: 5 (ref 5.0–8.0)

## 2023-01-01 LAB — CBC
HCT: 32.2 % — ABNORMAL LOW (ref 36.0–46.0)
Hemoglobin: 9.5 g/dL — ABNORMAL LOW (ref 12.0–15.0)
MCH: 24.8 pg — ABNORMAL LOW (ref 26.0–34.0)
MCHC: 29.5 g/dL — ABNORMAL LOW (ref 30.0–36.0)
MCV: 84.1 fL (ref 80.0–100.0)
Platelets: 403 10*3/uL — ABNORMAL HIGH (ref 150–400)
RBC: 3.83 MIL/uL — ABNORMAL LOW (ref 3.87–5.11)
RDW: 15.7 % — ABNORMAL HIGH (ref 11.5–15.5)
WBC: 10.2 10*3/uL (ref 4.0–10.5)
nRBC: 0 % (ref 0.0–0.2)

## 2023-01-01 LAB — GLUCOSE, CAPILLARY: Glucose-Capillary: 108 mg/dL — ABNORMAL HIGH (ref 70–99)

## 2023-01-01 MED ORDER — INSULIN ASPART 100 UNIT/ML IJ SOLN: 0.0000 [IU] | Freq: Three times a day (TID) | INTRAMUSCULAR | Status: DC

## 2023-01-01 MED ORDER — ATORVASTATIN CALCIUM 20 MG PO TABS
80.0000 mg | ORAL_TABLET | Freq: Every evening | ORAL | Status: DC
  Administered 2023-01-01 – 2023-01-02 (×2): 80 mg via ORAL
  Filled 2023-01-01 (×2): qty 4

## 2023-01-01 MED ORDER — ONDANSETRON HCL 4 MG/2ML IJ SOLN: 4.0000 mg | Freq: Four times a day (QID) | INTRAMUSCULAR | Status: DC | PRN

## 2023-01-01 MED ORDER — APIXABAN 5 MG PO TABS
5.0000 mg | ORAL_TABLET | Freq: Two times a day (BID) | ORAL | Status: DC
  Administered 2023-01-02 (×2): 5 mg via ORAL
  Filled 2023-01-01 (×2): qty 1

## 2023-01-01 MED ORDER — INSULIN ASPART 100 UNIT/ML IJ SOLN: 0.0000 [IU] | Freq: Every day | INTRAMUSCULAR | Status: DC

## 2023-01-01 MED ORDER — BENZONATATE 100 MG PO CAPS: 100.0000 mg | ORAL_CAPSULE | Freq: Two times a day (BID) | ORAL | Status: DC | PRN

## 2023-01-01 MED ORDER — SENNOSIDES-DOCUSATE SODIUM 8.6-50 MG PO TABS: 1.0000 | ORAL_TABLET | Freq: Every evening | ORAL | Status: DC | PRN

## 2023-01-01 MED ORDER — ACETAMINOPHEN 325 MG PO TABS: 650.0000 mg | ORAL_TABLET | Freq: Four times a day (QID) | ORAL | Status: DC | PRN

## 2023-01-01 MED ORDER — HYDRALAZINE HCL 20 MG/ML IJ SOLN: 5.0000 mg | Freq: Three times a day (TID) | INTRAMUSCULAR | Status: DC | PRN

## 2023-01-01 MED ORDER — ACETAMINOPHEN 650 MG RE SUPP: 650.0000 mg | Freq: Four times a day (QID) | RECTAL | Status: DC | PRN

## 2023-01-01 MED ORDER — DEXTROSE 50 % IV SOLN
1.0000 | Freq: Once | INTRAVENOUS | Status: AC
  Administered 2023-01-01: 50 mL via INTRAVENOUS
  Filled 2023-01-01: qty 50

## 2023-01-01 MED ORDER — OXYCODONE HCL 5 MG PO TABS: 2.5000 mg | ORAL_TABLET | Freq: Four times a day (QID) | ORAL | Status: DC | PRN

## 2023-01-01 MED ORDER — NICOTINE 14 MG/24HR TD PT24: 14.0000 mg | MEDICATED_PATCH | Freq: Every day | TRANSDERMAL | Status: DC | PRN

## 2023-01-01 MED ORDER — ENOXAPARIN SODIUM 60 MG/0.6ML IJ SOSY: 50.0000 mg | PREFILLED_SYRINGE | INTRAMUSCULAR | Status: DC

## 2023-01-01 MED ORDER — VENLAFAXINE HCL ER 75 MG PO CP24
150.0000 mg | ORAL_CAPSULE | Freq: Every day | ORAL | Status: DC
  Administered 2023-01-02: 150 mg via ORAL
  Filled 2023-01-01: qty 2

## 2023-01-01 MED ORDER — TRAZODONE HCL 50 MG PO TABS
25.0000 mg | ORAL_TABLET | Freq: Every day | ORAL | Status: DC
  Administered 2023-01-02: 25 mg via ORAL
  Filled 2023-01-01: qty 1

## 2023-01-01 MED ORDER — ONDANSETRON HCL 4 MG PO TABS: 4.0000 mg | ORAL_TABLET | Freq: Four times a day (QID) | ORAL | Status: DC | PRN

## 2023-01-01 MED ORDER — TIZANIDINE HCL 2 MG PO TABS: 2.0000 mg | ORAL_TABLET | Freq: Two times a day (BID) | ORAL | Status: DC | PRN

## 2023-01-01 MED ORDER — LORAZEPAM 2 MG/ML IJ SOLN
1.0000 mg | INTRAMUSCULAR | Status: DC | PRN
  Administered 2023-01-01: 1 mg via INTRAVENOUS
  Filled 2023-01-01: qty 1

## 2023-01-01 MED ORDER — LEVOTHYROXINE SODIUM 88 MCG PO TABS
88.0000 ug | ORAL_TABLET | Freq: Every day | ORAL | Status: DC
  Administered 2023-01-02: 88 ug via ORAL
  Filled 2023-01-01 (×2): qty 1

## 2023-01-01 NOTE — H&P (Signed)
History and Physical   [name] LKG:[id number] DOB: May 25, 1941 DOA: 01/01/2023  PCP: Lyndon Code, MD  Outpatient Specialists: Dr. Graciela Husbands, [facility] cardiology Patient coming from: home via EMS  I have personally briefly reviewed patient's old medical records in [facility] EMR.  Chief Concern: Weakness, slurred speech  HPI: [name] is an 82 year old female with history of TIA, non-insulin-dependent diabetes mellitus, hypertension, hypothyroid, history of atrial fibrillation on Eliquis, hyperlipidemia, heart failure preserved ejection fraction, who presents emergency department for chief concerns of weakness and slurred speech  Vitals in the ED showed temperature 98.3, respiration rate of 18, heart rate of 64, blood pressure 126/51, SpO2 100% on 2 L nasal cannula.  Serum sodium is 137, potassium 3.8, chloride 102, bicarb 26, BUN of 12, serum creatinine 1.10, EGFR 50, nonfasting blood glucose 74, WBC 10.2, hemoglobin 9.5, platelets of 403.  ED treatment: Dextrose 50% 1 amp one-time dose ordered ------------------------ At bedside, patient was able to tell her name, age, location, current, year.  She had her normal breakfast lunch today.  At approximately 1p, patient felt weak and difficulty speaking.  She also had left-sided weakness.  She reports these episodes are similar to her prior TIA and stroke episodes.  She reports the slurring of speech and weakness has improved.  She reports the weakness was generalized.  She denies known fever, chills, chest pain, shortness of breath, dysuria, hematuria, diarrhea, syncope, loss of consciousness.  She denies trauma to her person.  Social history: She infrequently uses tobacco products as she can only smoke outside and not in the facility.  She denies EtOH and recreational drug use.  She is retired.  ROS: Constitutional: no weight change, no fever ENT/Mouth: no sore throat, no rhinorrhea Eyes: no eye pain, no vision  changes Cardiovascular: no chest pain, no dyspnea,  no edema, no palpitations Respiratory: no cough, no sputum, no wheezing Gastrointestinal: no nausea, no vomiting, no diarrhea, no constipation Genitourinary: no urinary incontinence, no dysuria, no hematuria Musculoskeletal: no arthralgias, no myalgias Skin: no skin lesions, no pruritus, Neuro: + weakness, no loss of consciousness, no syncope Psych: no anxiety, no depression, + decrease appetite Heme/Lymph: no bruising, no bleeding  ED Course: Discussed with emergency medicine provider, patient requiring hospitalization for chief concerns of TIA workup and hypoglycemia.  Assessment/Plan  Principal Problem:   TIA (transient ischemic attack) Active Problems:   Essential hypertension   Hyperlipidemia   Hypothyroidism   Tobacco abuse   History of CVA (cerebrovascular accident)   History of stroke   PAF (paroxysmal atrial fibrillation) (HCC)   Weakness of left upper extremity   Generalized weakness   Assessment and Plan:  * TIA (transient ischemic attack) Stroke-like symptoms Left upper and lower extremity weakness compared to the right Complete echo MRI of the brain wo contrast ordered, if positive for new stroke, AM team to consult neurology Fasting lipid Permissive hypertension: Hydralazine 5 mg IV every 8 hours as needed for SBP greater than 180 Frequent neuro vascular checks Heart healthy Fall precaution and aspiration precaution  Generalized weakness Workup in progress, differentials include hypoglycemia versus TIA versus stroke  Weakness of left upper extremity TIA/stroke workup MRI of the brain without contrast ordered  PAF (paroxysmal atrial fibrillation) (HCC) Resumed home apixaban 5 mg p.o. twice daily  Tobacco abuse Patient is not ready to quit smoking As needed nicotine patch ordered  Hypothyroidism Levothyroxine 88 mcg daily resumed  Hyperlipidemia Atorvastatin 80 mg nightly resumed  Essential  hypertension Holding home  metoprolol succinate 50 mg daily and furosemide 20 mg daily Hydralazine 5 mg IV every 8 hours as needed for SBP greater than 180, 40 hours  Chart reviewed.   DVT prophylaxis: Enoxaparin Code Status: Full code Diet: Heart healthy Family Communication: Updated daughters at bedside with patient's permission Disposition Plan: Pending clinical course Consults called: none at this time, pending MRI, if positive AM team to consult neurology Admission status: Telemetry medical, observation  Past Medical History:  Diagnosis Date   Anxiety    Arthritis    "shoulders" (08/20/2014)   Asthma    Chronic lower back pain    Complication of anesthesia    "agitated & restless after knee replacement in 2008"   COPD (chronic obstructive pulmonary disease) (HCC)    DDD (degenerative disc disease), lumbar    Depression    Diabetes mellitus without complication (HCC)    Diverticulosis    Family history of adverse reaction to anesthesia    GERD (gastroesophageal reflux disease)    High cholesterol    Hypertension    Hypothyroidism    Memory loss    mild   On home oxygen therapy    "2L at night" (08/20/2014)   PAF (paroxysmal atrial fibrillation) (HCC)    Peripheral vascular disease (HCC)    Pre-diabetes    Shortness of breath dyspnea    with exertion   Stroke (HCC)    mild, no deficits   Past Surgical History:  Procedure Laterality Date   CARDIAC CATHETERIZATION  ~ 2007   EXCISIONAL HEMORRHOIDECTOMY  1960's   EYE SURGERY Bilateral    Cataract extraction with IOL   KNEE ARTHROSCOPY Left X 2 <2008   KNEE ARTHROSCOPY WITH SUBCHONDROPLASTY Right 04/19/2016   Procedure: KNEE ARTHROSCOPY WITH SUBCHONDROPLASTY, PARTIAL MENISCECTOMY;  Surgeon: Kennedy Bucker, MD;  Location: [facility] ORS;  Service: Orthopedics;  Laterality: Right;   LOOP RECORDER INSERTION N/A 01/02/2018   Procedure: LOOP RECORDER INSERTION;  Surgeon: Duke Salvia, MD;  Location: [facility] INVASIVE CV LAB;  Service:  Cardiovascular;  Laterality: N/A;   LUMBAR LAMINECTOMY/DECOMPRESSION MICRODISCECTOMY N/A 02/06/2021   Procedure: Lumbar Three-Four, Lumbar Four-Five Laminectomy and Foraminotomy;  Surgeon: Barnett Abu, MD;  Location: [facility] OR;  Service: Neurosurgery;  Laterality: N/A;   SHOULDER OPEN ROTATOR CUFF REPAIR Right 2001   TONSILLECTOMY  ~ 1950   TOTAL KNEE ARTHROPLASTY Left 2008   TOTAL KNEE ARTHROPLASTY Right 07/03/2016   Procedure: TOTAL KNEE ARTHROPLASTY;  Surgeon: Kennedy Bucker, MD;  Location: [facility] ORS;  Service: Orthopedics;  Laterality: Right;   TUBAL LIGATION  1973   VAGINAL HYSTERECTOMY  1974   Social History:  reports that she has been smoking cigarettes. She has a 13.25 pack-year smoking history. She has never used smokeless tobacco. She reports that she does not drink alcohol and does not use drugs.  Allergies  Allergen Reactions   Heparin Nausea And Vomiting   Family History  Problem Relation Age of Onset   Diabetes Mother    Hypertension Mother    Lung cancer Mother    Congestive Heart Failure Father    Congestive Heart Failure Sister    Family history: Family history reviewed and not pertinent.  Prior to Admission medications   Medication Sig Start Date End Date Taking? Authorizing Provider  amLODipine (NORVASC) 5 MG tablet TAKE 1 TABLET BY MOUTH DAILY Patient taking differently: Take 5 mg by mouth daily. 09/19/22  Yes McDonough, Lauren K, PA-C  apixaban (ELIQUIS) 5 MG TABS tablet Take 1 tablet (5 mg  total) by mouth 2 (two) times daily. 03/05/22  Yes Duke Salvia, MD  atorvastatin (LIPITOR) 80 MG tablet TAKE 1 TABLET BY MOUTH DAILY AT 6PM Patient taking differently: Take 80 mg by mouth See admin instructions. TAKE 1 TABLET BY MOUTH DAILY AT 6PM 08/03/22  Yes McDonough, Lauren K, PA-C  flecainide (TAMBOCOR) 50 MG tablet Take 1 tablet (50 mg total) by mouth 2 (two) times daily. 03/05/22  Yes Duke Salvia, MD  furosemide (LASIX) 20 MG tablet Take 1 tablet (20 mg total) by  mouth every other day. 03/05/22  Yes Duke Salvia, MD  JARDIANCE 10 MG TABS tablet TAKE 1 TABLET BY MOUTH DAILY BEFORE BREAKFAST 12/10/22  Yes McDonough, Lauren K, PA-C  oxyCODONE (ROXICODONE) 5 MG immediate release tablet Take 0.5 tablets (2.5 mg total) by mouth every 6 (six) hours as needed for severe pain. 12/18/22 12/18/23 Yes Shaune Pollack, MD  tiZANidine (ZANAFLEX) 4 MG tablet Take 0.5 tablets (2 mg total) by mouth 2 (two) times daily as needed for muscle spasms. 12/24/22  Yes Fulton Reek, MD  venlafaxine XR (EFFEXOR-XR) 75 MG 24 hr capsule TAKE 2 CAPSULES BY MOUTH DAILY Patient taking differently: Take 150 mg by mouth daily with breakfast. 09/05/22  Yes McDonough, Lauren K, PA-C  benzonatate (TESSALON) 100 MG capsule Take 1 capsule (100 mg total) by mouth 2 (two) times daily as needed for cough. 08/08/22   Sallyanne Kuster, NP  donepezil (ARICEPT) 10 MG tablet Take 1 tablet (10 mg total) by mouth at bedtime. 10/16/22   McDonough, Salomon Fick, PA-C  levothyroxine (SYNTHROID) 88 MCG tablet TAKE 1 TABLET BY MOUTH DAILY 05/09/22   McDonough, Lauren K, PA-C  metoprolol succinate (TOPROL-XL) 50 MG 24 hr tablet TAKE 1 TABLETS BY MOUTH DAILY. TAKE WITH OR IMMEDIATELY FOLLOWING A MEAL 11/09/22   Duke Salvia, MD  traZODone (DESYREL) 50 MG tablet Take 1/2 tablet by mouth before bed. Patient taking differently: Take 25 mg by mouth at bedtime. 11/08/22   McDonough, Salomon Fick, PA-C  Vitamin D, Ergocalciferol, (DRISDOL) 1.25 MG (50000 UNIT) CAPS capsule Take 1 capsule by mouth weekly 11/08/22   Carlean Jews, PA-C   Physical Exam: Vitals:   01/01/23 1425 01/01/23 1433 01/01/23 1900 01/01/23 2005  BP:    (!) 146/48  Pulse:    79  Resp:    18  Temp:   98.3 F (36.8 C) 98.3 F (36.8 C)  TempSrc:      SpO2:  100%  100%  Weight: 97.1 kg     Height: 5\' 4"  (1.626 m)      Constitutional: appears age-appropriate, frail, NAD , calm Eyes: PERRL, lids and conjunctivae normal ENMT: Mucous membranes are  moist. Posterior pharynx clear of any exudate or lesions. Age-appropriate dentition. Hearing appropriate Neck: normal, supple, no masses, no thyromegaly Respiratory: clear to auscultation bilaterally, no wheezing, no crackles. Normal respiratory effort. No accessory muscle use.  Cardiovascular: Regular rate and rhythm, no murmurs / rubs / gallops. No extremity edema. 2+ pedal pulses. No carotid bruits.  Abdomen: Obese abdomen, no tenderness, no masses palpated, no hepatosplenomegaly. Bowel sounds positive.  Musculoskeletal: no clubbing / cyanosis. No joint deformity upper and lower extremities. Good ROM, no contractures, no atrophy. Normal muscle tone.  Skin: no rashes, lesions, ulcers. No induration Neurologic: Sensation intact.  Weakness of the left upper and lower extremity, compared to the right Psychiatric: Normal judgment and insight. Alert and oriented x 3. Normal mood.   EKG: independently reviewed, showing  sinus rhythm with rate of 63, QTc 454  Chest x-ray on Admission: I personally reviewed and I agree with radiologist reading as below.  DG Chest 2 View  Result Date: 01/01/2023 CLINICAL DATA:  Weakness.  Cough. EXAM: CHEST - 2 VIEW COMPARISON:  12/24/2022.  CT, 10/16/2022. FINDINGS: Cardiac silhouette mildly enlarged. No mediastinal or hilar masses. No evidence of adenopathy. Heterogeneous bilateral interstitial thickening with areas of intervening ground-glass opacity consistent with interstitial lung disease and without significant change prior exams. No convincing pneumonia or pulmonary edema. Skeletal structures are intact. IMPRESSION: 1. No acute cardiopulmonary disease. Electronically Signed   By: Amie Portland M.D.   On: 01/01/2023 16:40   CT Head Wo Contrast  Result Date: 01/01/2023 CLINICAL DATA:  TIA, slurred speech EXAM: CT HEAD WITHOUT CONTRAST TECHNIQUE: Contiguous axial images were obtained from the base of the skull through the vertex without intravenous contrast.  RADIATION DOSE REDUCTION: This exam was performed according to the departmental dose-optimization program which includes automated exposure control, adjustment of the mA and/or kV according to patient size and/or use of iterative reconstruction technique. COMPARISON:  12/18/2022 FINDINGS: Brain: No acute intracranial findings are seen. There are no signs of bleeding within the cranium. There is old infarct in right parietal lobe in right MCA distribution. Cortical sulci are prominent. There is decreased density in periventricular and subcortical white matter. Vascular: Scattered arterial calcifications are seen. Skull: No acute findings are seen. Postsurgical changes are noted in the optic globes. Sinuses/Orbits: Unremarkable. Other: None. IMPRESSION: No acute intracranial findings are seen in noncontrast CT brain. Old infarct is seen in right parietal lobe with no change. Atrophy. Small-vessel disease. Electronically Signed   By: Ernie Avena M.D.   On: 01/01/2023 15:20    Labs on Admission: I have personally reviewed following labs  CBC: Recent Labs  Lab 01/01/23 1432  WBC 10.2  HGB 9.5*  HCT 32.2*  MCV 84.1  PLT 403*   Basic Metabolic Panel: Recent Labs  Lab 01/01/23 1432  NA 137  K 3.8  CL 102  CO2 26  GLUCOSE 74  BUN 12  CREATININE 1.10*  CALCIUM 9.0   GFR: Estimated Creatinine Clearance: 43.9 mL/min (A) (by C-G formula based on SCr of 1.1 mg/dL (H)).  Liver Function Tests: Recent Labs  Lab 01/01/23 1432  AST 22  ALT 13  ALKPHOS 87  BILITOT 0.6  PROT 6.5  ALBUMIN 3.7   BNP (last 3 results) Recent Labs    03/05/22 1530  PROBNP 362   HbA1C: No results for input(s): "HGBA1C" in the last 72 hours. CBG: Recent Labs  Lab 01/01/23 1420 01/01/23 1443 01/01/23 1805  GLUCAP 56* 156* 85   Urine analysis:    Component Value Date/Time   COLORURINE STRAW (A) 01/01/2023 1637   APPEARANCEUR CLEAR (A) 01/01/2023 1637   APPEARANCEUR CANCELED 05/28/2022 1546    LABSPEC 1.007 01/01/2023 1637   LABSPEC 1.035 08/02/2014 2331   PHURINE 5.0 01/01/2023 1637   GLUCOSEU >=500 (A) 01/01/2023 1637   GLUCOSEU Negative 08/02/2014 2331   HGBUR NEGATIVE 01/01/2023 1637   BILIRUBINUR NEGATIVE 01/01/2023 1637   BILIRUBINUR CANCELED 05/28/2022 1546   BILIRUBINUR Negative 08/02/2014 2331   KETONESUR NEGATIVE 01/01/2023 1637   PROTEINUR NEGATIVE 01/01/2023 1637   UROBILINOGEN 0.2 03/15/2020 1416   UROBILINOGEN 1.0 08/20/2014 2125   NITRITE NEGATIVE 01/01/2023 1637   LEUKOCYTESUR NEGATIVE 01/01/2023 1637   LEUKOCYTESUR Negative 08/02/2014 2331   This document was prepared using Dragon Voice Recognition software and  may include unintentional dictation errors.  Dr. Sedalia Muta [facility]  If 7PM-7AM, please contact overnight-coverage provider If 7AM-7PM, please contact day attending provider [email or web address]  01/01/2023, 9:34 PM

## 2023-01-01 NOTE — Hospital Course (Signed)
[name] is an 82 year old female with history of TIA, non-insulin-dependent diabetes mellitus, hypertension, hypothyroid, history of atrial fibrillation on Eliquis, hyperlipidemia, heart failure preserved ejection fraction, who presents emergency department for chief concerns of weakness and slurred speech  Vitals in the ED showed temperature 98.3, respiration rate of 18, heart rate of 64, blood pressure 126/51, SpO2 100% on 2 L nasal cannula.  Serum sodium is 137, potassium 3.8, chloride 102, bicarb 26, BUN of 12, serum creatinine 1.10, EGFR 50, nonfasting blood glucose 74, WBC 10.2, hemoglobin 9.5, platelets of 403.  ED treatment: Dextrose 50% 1 amp one-time dose ordered

## 2023-01-01 NOTE — Assessment & Plan Note (Signed)
-   Levothyroxine 88 mcg daily resumed 

## 2023-01-01 NOTE — Assessment & Plan Note (Addendum)
Stroke-like symptoms Left upper and lower extremity weakness compared to the right Complete echo MRI of the brain wo contrast ordered, if positive for new stroke, AM team to consult neurology Fasting lipid Permissive hypertension: Hydralazine 5 mg IV every 8 hours as needed for SBP greater than 180 Frequent neuro vascular checks Heart healthy Fall precaution and aspiration precaution

## 2023-01-01 NOTE — Assessment & Plan Note (Signed)
Patient is not ready to quit smoking As needed nicotine patch ordered

## 2023-01-01 NOTE — ED Provider Notes (Signed)
[facility] Provider Note    Event Date/Time   First MD Initiated Contact with Patient 01/01/23 1506     (approximate)   History   Chief Complaint Weakness   HPI  [name] Douglas is a 82 y.o. female with a past medical history of hypertension, hyperlipidemia, diabetes, atrial fibrillation on Eliquis, stroke, COPD on 2 L nasal cannula, and chronic back pain who presents to the ED complaining of weakness.  Patient reports that she was feeling well when she went to bed last night, woke up this morning "feeling off."  She reports feeling dizzy and unsteady on her feet, was attempting to walk using her walker but kept crashing into things.  She denies any falls and has not hit her head recently.  Family came to check on her about an hour prior to arrival and found patient to have slurred speech.  They state that she had normal speech when they talk to her last night, had not spoken to her since then.  Patient recently completed a course of antibiotics for pneumonia, but states that she is not having any trouble breathing, fever, or cough.  She reports feeling generally weak, denies any focal weakness in her extremities and has not had any vision changes.  Family now states that patient speech seems back to normal.     Physical Exam   Triage Vital Signs: ED Triage Vitals  Enc Vitals Group     BP 01/01/23 1417 (!) 126/51     Pulse Rate 01/01/23 1417 64     Resp 01/01/23 1417 18     Temp 01/01/23 1417 98.3 F (36.8 C)     Temp Source 01/01/23 1417 Oral     SpO2 01/01/23 1421 (!) 88 %     Weight 01/01/23 1421 214 lb 1.1 oz (97.1 kg)     Height 01/01/23 1421 5\' 4"  (1.626 m)     Head Circumference --      Peak Flow --      Pain Score 01/01/23 1420 0     Pain Loc --      Pain Edu? --      Excl. in GC? --     Most recent vital signs: Vitals:   01/01/23 1421 01/01/23 1433  BP:    Pulse:    Resp:    Temp:    SpO2: (!) 88% 100%    Constitutional: Alert  and oriented. Eyes: Conjunctivae are normal. Head: Atraumatic. Nose: No congestion/rhinnorhea. Mouth/Throat: Mucous membranes are moist.  Cardiovascular: Normal rate, regular rhythm. Grossly normal heart sounds.  2+ radial pulses bilaterally. Respiratory: Normal respiratory effort.  No retractions. Lungs CTAB. Gastrointestinal: Soft and nontender. No distention. Musculoskeletal: No lower extremity tenderness nor edema.  Neurologic:  Normal speech and language. No gross focal neurologic deficits are appreciated.    ED Results / Procedures / Treatments   Labs (all labs ordered are listed, but only abnormal results are displayed) Labs Reviewed  BASIC METABOLIC PANEL - Abnormal; Notable for the following components:      Result Value   Creatinine, Ser 1.10 (*)    GFR, Estimated 50 (*)    All other components within normal limits  CBC - Abnormal; Notable for the following components:   RBC 3.83 (*)    Hemoglobin 9.5 (*)    HCT 32.2 (*)    MCH 24.8 (*)    MCHC 29.5 (*)    RDW 15.7 (*)    Platelets 403 (*)  All other components within normal limits  URINALYSIS, ROUTINE W REFLEX MICROSCOPIC - Abnormal; Notable for the following components:   Color, Urine STRAW (*)    APPearance CLEAR (*)    Glucose, UA >=500 (*)    All other components within normal limits  CBG MONITORING, ED - Abnormal; Notable for the following components:   Glucose-Capillary 56 (*)    All other components within normal limits  CBG MONITORING, ED - Abnormal; Notable for the following components:   Glucose-Capillary 156 (*)    All other components within normal limits  HEPATIC FUNCTION PANEL  BASIC METABOLIC PANEL  CBC     EKG  ED ECG REPORT I, Chesley Noon, the attending physician, personally viewed and interpreted this ECG.   Date: 01/01/2023  EKG Time: 14:28  Rate: 63  Rhythm: normal sinus rhythm  Axis: Normal  Intervals:none  ST&T Change: None  RADIOLOGY CT head reviewed and interpreted  by me with no hemorrhage or midline shift.  PROCEDURES:  Critical Care performed: No  Procedures   MEDICATIONS ORDERED IN ED: Medications  atorvastatin (LIPITOR) tablet 80 mg (has no administration in time range)  ondansetron (ZOFRAN) tablet 4 mg (has no administration in time range)    Or  ondansetron (ZOFRAN) injection 4 mg (has no administration in time range)  enoxaparin (LOVENOX) injection 40 mg (has no administration in time range)  acetaminophen (TYLENOL) tablet 650 mg (has no administration in time range)    Or  acetaminophen (TYLENOL) suppository 650 mg (has no administration in time range)  senna-docusate (Senokot-S) tablet 1 tablet (has no administration in time range)  dextrose 50 % solution 50 mL (50 mLs Intravenous Given 01/01/23 1427)     IMPRESSION / MDM / ASSESSMENT AND PLAN / ED COURSE  I reviewed the triage vital signs and the nursing notes.                              82 y.o. female with past medical history of hypertension, hyperlipidemia, diabetes, stroke, atrial fibrillation on Eliquis, COPD on 2 L, and chronic back pain who presents to the ED with weakness and dizziness starting this morning with slurred speech noted by family that has since improved.  Patient's presentation is most consistent with acute presentation with potential threat to life or bodily function.  Differential diagnosis includes, but is not limited to, stroke, TIA, intracranial hemorrhage, electrode abnormality, AKI, anemia, UTI, pneumonia, hypoglycemia, hypoxia.  Patient nontoxic-appearing and in no acute distress, vital signs initially remarkable for hypoxia however patient arrived not on her usual 2 L nasal cannula and oxygen levels improved with application of nasal cannula.  Patient currently denies any respiratory symptoms, but given her recent pneumonia we will recheck chest x-ray.  CT head is negative for acute process and neurologic symptoms seem to be resolving, CT head is  negative for acute process, but I would be concerned for TIA given her presentation.  Patient also noted to be borderline hypoglycemic on arrival, now improved following an amp of D50 and we will continue to monitor.  Labs with stable chronic kidney disease as well as stable anemia, no significant leukocytosis or electrolyte abnormality.  LFTs and urinalysis are pending at this time.  Urinalysis and chest x-ray with no signs of infection.  LFTs are also unremarkable.  Patient continues to report weakness but speech remains improved.  Would consider hypoglycemia as source of episode but patient would also benefit  from further stroke workup given her history.  Case discussed with hospitalist for admission.      FINAL CLINICAL IMPRESSION(S) / ED DIAGNOSES   Final diagnoses:  TIA (transient ischemic attack)  Slurred speech  Hypoglycemia     Rx / DC Orders   ED Discharge Orders     None        Note:  This document was prepared using Dragon voice recognition software and may include unintentional dictation errors.   Chesley Noon, MD 01/01/23 1730

## 2023-01-01 NOTE — Assessment & Plan Note (Signed)
TIA/stroke workup MRI of the brain without contrast ordered

## 2023-01-01 NOTE — ED Triage Notes (Addendum)
Pt here with weakness, hx of TIA x2. Pt's daughter states her speech was slurred around 1230 today. Pt c/o weakness and unsteady gait. Pt states her speech has resolved   80-cbg 133/64 18G LFA

## 2023-01-01 NOTE — Assessment & Plan Note (Signed)
-   Resumed home apixaban 5 mg p.o. twice daily 

## 2023-01-01 NOTE — Assessment & Plan Note (Signed)
Workup in progress, differentials include hypoglycemia versus TIA versus stroke

## 2023-01-01 NOTE — Assessment & Plan Note (Signed)
-   Atorvastatin 80 mg nightly resumed ?

## 2023-01-01 NOTE — Assessment & Plan Note (Signed)
Holding home metoprolol succinate 50 mg daily and furosemide 20 mg daily Hydralazine 5 mg IV every 8 hours as needed for SBP greater than 180, 40 hours

## 2023-01-02 ENCOUNTER — Observation Stay: Payer: Medicare Other

## 2023-01-02 ENCOUNTER — Observation Stay (HOSPITAL_BASED_OUTPATIENT_CLINIC_OR_DEPARTMENT_OTHER)
Admit: 2023-01-02 | Discharge: 2023-01-02 | Disposition: A | Discharge status: Home / Self Care | Payer: Medicare Other | Attending: Student | Admitting: Student

## 2023-01-02 ENCOUNTER — Telehealth (HOSPITAL_COMMUNITY): Payer: Self-pay | Admitting: Pharmacy Technician

## 2023-01-02 ENCOUNTER — Other Ambulatory Visit (HOSPITAL_COMMUNITY): Payer: Self-pay

## 2023-01-02 DIAGNOSIS — I6389 Other cerebral infarction: Secondary | ICD-10-CM

## 2023-01-02 DIAGNOSIS — I63233 Cerebral infarction due to unspecified occlusion or stenosis of bilateral carotid arteries: Secondary | ICD-10-CM | POA: Diagnosis not present

## 2023-01-02 DIAGNOSIS — G459 Transient cerebral ischemic attack, unspecified: Secondary | ICD-10-CM | POA: Diagnosis not present

## 2023-01-02 DIAGNOSIS — I6782 Cerebral ischemia: Secondary | ICD-10-CM | POA: Diagnosis not present

## 2023-01-02 LAB — BASIC METABOLIC PANEL
Anion gap: 9 (ref 5–15)
BUN: 11 mg/dL (ref 8–23)
CO2: 27 mmol/L (ref 22–32)
Calcium: 9.1 mg/dL (ref 8.9–10.3)
Chloride: 104 mmol/L (ref 98–111)
Creatinine, Ser: 0.94 mg/dL (ref 0.44–1.00)
GFR, Estimated: >60 mL/min (ref >60)
Glucose, Bld: 98 mg/dL (ref 70–99)
Potassium: 3.7 mmol/L (ref 3.5–5.1)
Sodium: 140 mmol/L (ref 135–145)

## 2023-01-02 LAB — GLUCOSE, CAPILLARY
Glucose-Capillary: 84 mg/dL (ref 70–99)
Glucose-Capillary: 116 mg/dL — ABNORMAL HIGH (ref 70–99)
Glucose-Capillary: 91 mg/dL (ref 70–99)

## 2023-01-02 LAB — CBC
HCT: 29.2 % — ABNORMAL LOW (ref 36.0–46.0)
Hemoglobin: 9 g/dL — ABNORMAL LOW (ref 12.0–15.0)
MCH: 25.2 pg — ABNORMAL LOW (ref 26.0–34.0)
MCHC: 30.8 g/dL (ref 30.0–36.0)
MCV: 81.8 fL (ref 80.0–100.0)
Platelets: 346 10*3/uL (ref 150–400)
RBC: 3.57 MIL/uL — ABNORMAL LOW (ref 3.87–5.11)
RDW: 15.8 % — ABNORMAL HIGH (ref 11.5–15.5)
WBC: 8.9 10*3/uL (ref 4.0–10.5)
nRBC: 0 % (ref 0.0–0.2)

## 2023-01-02 LAB — ECHOCARDIOGRAM COMPLETE
AR max vel: 1.7 cm2
AV Area VTI: 1.98 cm2
AV Area mean vel: 1.82 cm2
AV Mean grad: 14 mmHg
AV Peak grad: 27.2 mmHg
Ao pk vel: 2.61 m/s
Area-P 1/2: 2.78 cm2
Height: 61 in
MV VTI: 2.98 cm2
P 1/2 time: 513 msec
S' Lateral: 2.8 cm
Weight: 3213.42 oz

## 2023-01-02 LAB — FOLATE: Folate: 14 ng/mL (ref >5.9)

## 2023-01-02 LAB — MAGNESIUM: Magnesium: 2.2 mg/dL (ref 1.7–2.4)

## 2023-01-02 LAB — PHOSPHORUS: Phosphorus: 4.8 mg/dL — ABNORMAL HIGH (ref 2.5–4.6)

## 2023-01-02 LAB — IRON AND TIBC
Iron: 36 ug/dL (ref 28–170)
Saturation Ratios: 8 % — ABNORMAL LOW (ref 10.4–31.8)
TIBC: 476 ug/dL — ABNORMAL HIGH (ref 250–450)
UIBC: 440 ug/dL

## 2023-01-02 LAB — VITAMIN B12: Vitamin B-12: 150 pg/mL — ABNORMAL LOW (ref 180–914)

## 2023-01-02 MED ORDER — IOHEXOL 350 MG/ML SOLN
75.0000 mL | Freq: Once | INTRAVENOUS | Status: AC | PRN
  Administered 2023-01-02: 75 mL via INTRAVENOUS

## 2023-01-02 MED ORDER — VITAMIN C 500 MG PO TABS: 500.0000 mg | ORAL_TABLET | Freq: Every day | ORAL | Status: DC

## 2023-01-02 MED ORDER — CYANOCOBALAMIN 1000 MCG/ML IJ SOLN
1000.0000 ug | Freq: Once | INTRAMUSCULAR | Status: AC
  Administered 2023-01-02: 1000 ug via INTRAMUSCULAR
  Filled 2023-01-02: qty 1

## 2023-01-02 MED ORDER — DABIGATRAN ETEXILATE MESYLATE 150 MG PO CAPS
150.0000 mg | ORAL_CAPSULE | Freq: Two times a day (BID) | ORAL | Status: DC
  Filled 2023-01-02 (×2): qty 1

## 2023-01-02 MED ORDER — POLYSACCHARIDE IRON COMPLEX 150 MG PO CAPS: 150.0000 mg | ORAL_CAPSULE | Freq: Every day | ORAL | 1 refills | Status: DC

## 2023-01-02 MED ORDER — VITAMIN B-12 1000 MCG PO TABS: 1000.0000 ug | ORAL_TABLET | Freq: Every day | ORAL | Status: DC

## 2023-01-02 MED ORDER — DABIGATRAN ETEXILATE MESYLATE 150 MG PO CAPS: 150.0000 mg | ORAL_CAPSULE | Freq: Two times a day (BID) | ORAL | 3 refills | Status: DC

## 2023-01-02 MED ORDER — ASCORBIC ACID 500 MG PO TABS: 500.0000 mg | ORAL_TABLET | Freq: Every day | ORAL | 1 refills | Status: AC

## 2023-01-02 MED ORDER — POTASSIUM CHLORIDE CRYS ER 20 MEQ PO TBCR
40.0000 meq | EXTENDED_RELEASE_TABLET | Freq: Once | ORAL | Status: AC
  Administered 2023-01-02: 40 meq via ORAL
  Filled 2023-01-02: qty 2

## 2023-01-02 MED ORDER — FLECAINIDE ACETATE 50 MG PO TABS
50.0000 mg | ORAL_TABLET | Freq: Two times a day (BID) | ORAL | Status: DC
  Administered 2023-01-02 (×2): 50 mg via ORAL
  Filled 2023-01-02 (×3): qty 1

## 2023-01-02 MED ORDER — CYANOCOBALAMIN 1000 MCG PO TABS: 1000.0000 ug | ORAL_TABLET | Freq: Every day | ORAL | 1 refills | Status: DC

## 2023-01-02 MED ORDER — POLYSACCHARIDE IRON COMPLEX 150 MG PO CAPS: 150.0000 mg | ORAL_CAPSULE | Freq: Every day | ORAL | Status: DC

## 2023-01-02 MED ORDER — SODIUM CHLORIDE 0.9 % IV SOLN
125.0000 mg | Freq: Once | INTRAVENOUS | Status: AC
  Administered 2023-01-02: 125 mg via INTRAVENOUS
  Filled 2023-01-02: qty 125

## 2023-01-02 MED ORDER — STROKE: EARLY STAGES OF RECOVERY BOOK: Freq: Once | Status: DC

## 2023-01-02 MED ORDER — DONEPEZIL HCL 5 MG PO TABS
10.0000 mg | ORAL_TABLET | Freq: Every day | ORAL | Status: DC
  Administered 2023-01-02: 10 mg via ORAL
  Filled 2023-01-02: qty 2

## 2023-01-02 NOTE — Evaluation (Signed)
Occupational Therapy Evaluation Patient Details Name: [name] Douglas MRN: [id number] DOB: October 30, 1940 Today's Date: 01/02/2023   History of Present Illness Patient is an 82 yo female that presented to the ED for weakness, slurred speech. Imaging showed "2.5 cm acute ischemic nonhemorrhagic infarct involving the  posterior parasagittal right parietal lobe, Chronic right posterior MCA territory infarct, with additional small remote left parietal and bilateral cerebellar infarcts". PMH of TIA, DM, HTN, afib, HLD.   Clinical Impression   [name] was seen for OT evaluation this date. Prior to hospital admission, pt was MOD I using 4WW. Pt lives alone in senior living apartments, children assist with IADLs as needed. Pt presents to acute OT demonstrating impaired ADL performance and functional mobility 2/2 functional strength deficits of non-dominant LUE and decreased activity tolerance. LUE 4/5 grossly, will issue theraputty and HEP next session. Pt currently requires SUPERVISION + RW toilet t/f and hand washing standing sink side, MOD I pericare with lateral leans. MOD I don/doff socks seated EOB, increased time only. Pt would benefit from skilled OT to address noted impairments and functional limitations (see below for any additional details). Upon hospital discharge, recommend follow up therapy.   Recommendations for follow up therapy are one component of a multi-disciplinary discharge planning process, led by the attending physician.  Recommendations may be updated based on patient status, additional functional criteria and insurance authorization.   Assistance Recommended at Discharge Set up Supervision/Assistance  Patient can return home with the following Help with stairs or ramp for entrance;Assistance with cooking/housework    Functional Status Assessment  Patient has had a recent decline in their functional status and demonstrates the ability to make significant improvements in function in a  reasonable and predictable amount of time.  Equipment Recommendations  BSC/3in1    Recommendations for Other Services       Precautions / Restrictions Precautions Precautions: Fall Restrictions Weight Bearing Restrictions: No      Mobility Bed Mobility Overal bed mobility: Modified Independent             General bed mobility comments: HOB elevated    Transfers Overall transfer level: Needs assistance Equipment used: Rolling walker (2 wheels) Transfers: Sit to/from Stand Sit to Stand: Supervision                  Balance Overall balance assessment: Needs assistance Sitting-balance support: Feet supported Sitting balance-Leahy Scale: Good     Standing balance support: No upper extremity supported, During functional activity Standing balance-Leahy Scale: Fair                             ADL either performed or assessed with clinical judgement   ADL Overall ADL's : Needs assistance/impaired                                       General ADL Comments: SUPERVISION + RW toilet t/f and hand washing standing sink side, MOD I pericare with lateral leans. MOD I don/doff socks seated EOB, increased time only.      Pertinent Vitals/Pain Pain Assessment Pain Assessment: No/denies pain     Hand Dominance Right   Extremity/Trunk Assessment Upper Extremity Assessment Upper Extremity Assessment: LUE deficits/detail LUE Deficits / Details: 4/5 grossly. intact digit opposition and RAM   Lower Extremity Assessment Lower Extremity Assessment: Defer to PT evaluation RLE Deficits /  Details: grossly 4/5 LLE Deficits / Details: grossly 4/5       Communication Communication Communication: No difficulties   Cognition Arousal/Alertness: Awake/alert Behavior During Therapy: WFL for tasks assessed/performed Overall Cognitive Status: Within Functional Limits for tasks assessed                                        General Comments  SpO2 99% on 4L [place] (baseline)            Home Living Family/patient expects to be discharged to:: Private residence Living Arrangements: Alone Available Help at Discharge: Family;Available PRN/intermittently Type of Home: Apartment Home Access: Level entry     Home Layout: One level     Bathroom Shower/Tub: Tub/shower unit         Home Equipment: Rollator (4 wheels);Grab bars - toilet   Additional Comments: recent HHPT      Prior Functioning/Environment Prior Level of Function : Independent/Modified Independent;History of Falls (last six months)                        OT Problem List: Decreased strength      OT Treatment/Interventions: Self-care/ADL training;Therapeutic exercise;Energy conservation;Neuromuscular education;DME and/or AE instruction;Balance training;Patient/family education;Therapeutic activities    OT Goals(Current goals can be found in the care plan section) Acute Rehab OT Goals Patient Stated Goal: to go home OT Goal Formulation: With patient Time For Goal Achievement: 01/16/23 Potential to Achieve Goals: Good ADL Goals Pt Will Perform Tub/Shower Transfer: Tub transfer;Independently Pt/caregiver will Perform Home Exercise Program: Increased ROM;Increased strength;Left upper extremity;Independently  OT Frequency: Min 2X/week    Co-evaluation              AM-PAC OT "6 Clicks" Daily Activity     Outcome Measure Help from another person eating meals?: None Help from another person taking care of personal grooming?: None Help from another person toileting, which includes using toliet, bedpan, or urinal?: None Help from another person bathing (including washing, rinsing, drying)?: A Little Help from another person to put on and taking off regular upper body clothing?: A Little Help from another person to put on and taking off regular lower body clothing?: None 6 Click Score: 22   End of Session Equipment Utilized  During Treatment: Rolling walker (2 wheels);Gait belt;Oxygen  Activity Tolerance: Patient tolerated treatment well Patient left: in bed (seated with PT)  OT Visit Diagnosis: Unsteadiness on feet (R26.81);Muscle weakness (generalized) (M62.81)                Time: 0981-1914 OT Time Calculation (min): 21 min Charges:  OT General Charges $OT Visit: 1 Visit OT Evaluation $OT Eval Low Complexity: 1 Low  Kathie Dike, M.S. OTR/L  01/02/23, 12:00 PM  ascom [phone]

## 2023-01-02 NOTE — TOC Transition Note (Signed)
Transition of Care [facility]) - CM/SW Discharge Note   Patient Details  Name: [name] Douglas MRN: [id number] Date of Birth: 1941/01/03  [facility] CM/SW Contact:  Allena Katz, LCSW Phone Number: 01/02/2023, 3:58 PM   Clinical Narrative:   Pt discharging home today. Pt already active with [facility].           Patient Goals and CMS Choice      Discharge Placement                         Discharge Plan and Services Additional resources added to the After Visit Summary for                                       Social Determinants of Health (SDOH) Interventions SDOH Screenings   Food Insecurity: No Food Insecurity (01/02/2023)  Housing: Low Risk  (01/02/2023)  Transportation Needs: No Transportation Needs (01/02/2023)  Utilities: Not At Risk (01/02/2023)  Alcohol Screen: Low Risk  (08/08/2020)  Depression (PHQ2-9): Low Risk  (04/10/2021)  Tobacco Use: High Risk (01/01/2023)     Readmission Risk Interventions     No data to display

## 2023-01-02 NOTE — Discharge Summary (Signed)
[facility] Discharge Summary   Patient: [name] Douglas ZOX:[id number]  PCP: Lyndon Code, MD  Date of admission: 01/01/2023   Date of discharge:  01/02/2023     Discharge Diagnoses:  Principal Problem:   TIA (transient ischemic attack) Active Problems:   Essential hypertension   Hyperlipidemia   Hypothyroidism   Tobacco abuse   History of CVA (cerebrovascular accident)   History of stroke   PAF (paroxysmal atrial fibrillation) (HCC)   Weakness of left upper extremity   Generalized weakness   Admitted From: Home Disposition:  Home with [facility] PT/OT  Recommendations for Outpatient Follow-up:  Follow-up with PCP in 1 to 2 weeks, repeat B12 level and iron profile after 3 to 6 months Follow with neurology in 1 to 2 months for follow-up of stroke and cognitive evaluation. Follow up LABS/TEST:  Repeat B12 and iron profile after 3 to 6 months   Follow-up Information     Morene Crocker, MD Follow up in 1 month(s).   Specialty: Neurology Contact information: [phone] [facility] [address] [facility] [place] [address] [phone]         Lyndon Code, MD Follow up in 1 week(s).   Specialties: Internal Medicine, Cardiology Contact information: [address] Marya Fossa [place] [address] [phone]                Diet recommendation: {dietplan:22518}  Activity: The patient is advised to gradually reintroduce usual activities, as tolerated  Discharge Condition: stable  Code Status: {Palliative Code status:23503}   History of present illness: As per the H and P dictated on admission, "***"  Hospital Course:  *** Summary of her active problems in the hospital is as following.   *** Body mass index is 37.95 kg/m.    Nutrition Interventions:        ***Pain control  - [place] Controlled Substance Reporting System database was reviewed. - *** day supply was provided. - Patient was instructed, not to drive, operate heavy  machinery, perform activities at heights, swimming or participation in water activities or provide baby sitting services while on Pain, Sleep and Anxiety Medications; until her outpatient Physician has advised to do so again.  - Also recommended to not to take more than prescribed Pain, Sleep and Anxiety Medications.  ***Patient was ambulatory without any assistance. ***Patient was seen by physical therapy, who recommended {d/c therapy plan:22521}, ***which was arranged. On the day of the discharge the patient's vitals were stable, and no other acute medical condition were reported by patient. the patient was felt safe to be discharge at {comingfrom:22515} with {d/c therapy plan:22521}.  Consultants: *** Procedures: ***  Discharge Exam: General: Appear in no distress, no Rash; Oral Mucosa Clear, moist. Cardiovascular: S1 and S2 Present, no Murmur, Respiratory: normal respiratory effort, Bilateral Air entry present and no Crackles, no wheezes Abdomen: Bowel Sound present, Soft and no tenderness, no hernia Extremities: no Pedal edema, no calf tenderness Neurology: alert and oriented to time, place, and person affect appropriate.  Filed Weights   01/01/23 1421 01/01/23 1425 01/01/23 2320  Weight: 97.1 kg 97.1 kg 91.1 kg   Vitals:   01/02/23 0320 01/02/23 0812  BP: (!) 141/38 (!) 132/104  Pulse: 63 63  Resp: 16 18  Temp: 98.4 F (36.9 C) (!) 97.5 F (36.4 C)  SpO2: 96% 98%    DISCHARGE MEDICATION: Allergies as of 01/02/2023       Reactions   Heparin Nausea And Vomiting  Medication List     STOP taking these medications    apixaban 5 MG Tabs tablet Commonly known as: Eliquis       TAKE these medications    amLODipine 5 MG tablet Commonly known as: NORVASC TAKE 1 TABLET BY MOUTH DAILY   ascorbic acid 500 MG tablet Commonly known as: VITAMIN C Take 1 tablet (500 mg total) by mouth daily. Start taking on: Jan 03, 2023   atorvastatin 80 MG tablet Commonly  known as: LIPITOR TAKE 1 TABLET BY MOUTH DAILY AT 6PM What changed:  how much to take how to take this when to take this   benzonatate 100 MG capsule Commonly known as: TESSALON Take 1 capsule (100 mg total) by mouth 2 (two) times daily as needed for cough.   cyanocobalamin 1000 MCG tablet Take 1 tablet (1,000 mcg total) by mouth daily. Start taking on: Jan 03, 2023   dabigatran 150 MG Caps capsule Commonly known as: PRADAXA Take 1 capsule (150 mg total) by mouth every 12 (twelve) hours.   donepezil 10 MG tablet Commonly known as: ARICEPT Take 1 tablet (10 mg total) by mouth at bedtime.   flecainide 50 MG tablet Commonly known as: TAMBOCOR Take 1 tablet (50 mg total) by mouth 2 (two) times daily.   furosemide 20 MG tablet Commonly known as: LASIX Take 1 tablet (20 mg total) by mouth every other day.   iron polysaccharides 150 MG capsule Commonly known as: NIFEREX Take 1 capsule (150 mg total) by mouth daily. Start taking on: Jan 03, 2023   Jardiance 10 MG Tabs tablet Generic drug: empagliflozin TAKE 1 TABLET BY MOUTH DAILY BEFORE BREAKFAST   levothyroxine 88 MCG tablet Commonly known as: SYNTHROID TAKE 1 TABLET BY MOUTH DAILY   metoprolol succinate 50 MG 24 hr tablet Commonly known as: TOPROL-XL TAKE 1 TABLETS BY MOUTH DAILY. TAKE WITH OR IMMEDIATELY FOLLOWING A MEAL   oxyCODONE 5 MG immediate release tablet Commonly known as: Roxicodone Take 0.5 tablets (2.5 mg total) by mouth every 6 (six) hours as needed for severe pain.   tiZANidine 4 MG tablet Commonly known as: Zanaflex Take 0.5 tablets (2 mg total) by mouth 2 (two) times daily as needed for muscle spasms.   traZODone 50 MG tablet Commonly known as: DESYREL Take 1/2 tablet by mouth before bed. What changed:  how much to take how to take this when to take this additional instructions   venlafaxine XR 75 MG 24 hr capsule Commonly known as: EFFEXOR-XR TAKE 2 CAPSULES BY MOUTH DAILY What changed:  when to take this   Vitamin D (Ergocalciferol) 1.25 MG (50000 UNIT) Caps capsule Commonly known as: DRISDOL Take 1 capsule by mouth weekly               Durable Medical Equipment  (From admission, onward)           Start     Ordered   01/02/23 1249  For home use only DME 3 n 1  Once        01/02/23 1248           Allergies  Allergen Reactions   Heparin Nausea And Vomiting   Discharge Instructions     Call MD for:   Complete by: As directed    Focal weakness or numbness, any new neurological changes.   Call MD for:  difficulty breathing, headache or visual disturbances   Complete by: As directed    Call MD for:  extreme  fatigue   Complete by: As directed    Call MD for:  persistant dizziness or light-headedness   Complete by: As directed    Call MD for:  persistant nausea and vomiting   Complete by: As directed    Call MD for:  severe uncontrolled pain   Complete by: As directed    Call MD for:  temperature >100.4   Complete by: As directed    Diet - low sodium heart healthy   Complete by: As directed    Discharge instructions   Complete by: As directed    Follow-up with PCP in 1 to 2 weeks, repeat B12 level and iron profile after 3 to 6 months Follow with neurology in 1 to 2 months for follow-up of stroke and cognitive evaluation.   Increase activity slowly   Complete by: As directed        The results of significant diagnostics from this hospitalization (including imaging, microbiology, ancillary and laboratory) are listed below for reference.    Significant Diagnostic Studies: ECHOCARDIOGRAM COMPLETE  Result Date: 01/02/2023    ECHOCARDIOGRAM REPORT   Patient Name:   KEIRSTEN HAMANN Date of Exam: 01/02/2023 Medical Rec #:  [id number]       Height:       61.0 in Accession #:    [id number]      Weight:       200.8 lb Date of Birth:  01-Nov-1940        BSA:          1.892 m Patient Age:    82 years        BP:           132/104 mmHg Patient Gender: F                HR:           66 bpm. Exam Location:  [facility] Procedure: 2D Echo, Cardiac Doppler and Color Doppler Indications:     Stroke  History:         Patient has prior history of Echocardiogram examinations, most                  recent 09/13/2021. Stroke, TIA, COPD and PAD, Arrythmias:Atrial                  Fibrillation and Tachycardia, Signs/Symptoms:Chest Pain and                  Dizziness/Lightheadedness; Risk Factors:Hypertension, Diabetes,                  Dyslipidemia and Current Smoker.  Sonographer:     Mikki Harbor Referring Phys:  [id number] Gillis Santa Diagnosing Phys: Debbe Odea MD IMPRESSIONS  1. Left ventricular ejection fraction, by estimation, is 60 to 65%. The left ventricle has normal function. The left ventricle has no regional wall motion abnormalities. There is mild left ventricular hypertrophy. Left ventricular diastolic parameters are consistent with Grade II diastolic dysfunction (pseudonormalization).  2. Right ventricular systolic function is normal. The right ventricular size is normal. There is moderately elevated pulmonary artery systolic pressure.  3. Left atrial size was moderately dilated.  4. The mitral valve is degenerative. Mild mitral valve regurgitation.  5. Tricuspid valve regurgitation is mild to moderate.  6. The aortic valve is tricuspid. Aortic valve regurgitation is mild. Aortic valve sclerosis is present, with no evidence of aortic valve stenosis.  7. The inferior vena cava is normal in size with  greater than 50% respiratory variability, suggesting right atrial pressure of 3 mmHg. FINDINGS  Left Ventricle: Left ventricular ejection fraction, by estimation, is 60 to 65%. The left ventricle has normal function. The left ventricle has no regional wall motion abnormalities. The left ventricular internal cavity size was normal in size. There is  mild left ventricular hypertrophy. Left ventricular diastolic parameters are consistent with Grade II diastolic dysfunction  (pseudonormalization). Right Ventricle: The right ventricular size is normal. No increase in right ventricular wall thickness. Right ventricular systolic function is normal. There is moderately elevated pulmonary artery systolic pressure. The tricuspid regurgitant velocity is 3.22 m/s, and with an assumed right atrial pressure of 8 mmHg, the estimated right ventricular systolic pressure is 49.5 mmHg. Left Atrium: Left atrial size was moderately dilated. Right Atrium: Right atrial size was normal in size. Pericardium: There is no evidence of pericardial effusion. Mitral Valve: The mitral valve is degenerative in appearance. Mild mitral valve regurgitation. MV peak gradient, 7.4 mmHg. The mean mitral valve gradient is 3.0 mmHg. Tricuspid Valve: The tricuspid valve is normal in structure. Tricuspid valve regurgitation is mild to moderate. Aortic Valve: The aortic valve is tricuspid. Aortic valve regurgitation is mild. Aortic regurgitation PHT measures 513 msec. Aortic valve sclerosis is present, with no evidence of aortic valve stenosis. Aortic valve mean gradient measures 14.0 mmHg. Aortic valve peak gradient measures 27.2 mmHg. Aortic valve area, by VTI measures 1.98 cm. Pulmonic Valve: The pulmonic valve was normal in structure. Pulmonic valve regurgitation is mild. Aorta: The aortic root and ascending aorta are structurally normal, with no evidence of dilitation. Venous: The inferior vena cava is normal in size with greater than 50% respiratory variability, suggesting right atrial pressure of 3 mmHg. IAS/Shunts: No atrial level shunt detected by color flow Doppler.  LEFT VENTRICLE PLAX 2D LVIDd:         4.50 cm   Diastology LVIDs:         2.80 cm   LV e' medial:    5.98 cm/s LV PW:         1.20 cm   LV E/e' medial:  20.1 LV IVS:        1.20 cm   LV e' lateral:   7.83 cm/s LVOT diam:     2.00 cm   LV E/e' lateral: 15.3 LV SV:         124 LV SV Index:   65 LVOT Area:     3.14 cm  RIGHT VENTRICLE RV Basal diam:   3.75 cm RV Mid diam:    3.00 cm RV S prime:     15.90 cm/s TAPSE (M-mode): 2.9 cm LEFT ATRIUM             Index        RIGHT ATRIUM           Index LA diam:        4.30 cm 2.27 cm/m   RA Area:     20.30 cm LA Vol (A2C):   85.0 ml 44.92 ml/m  RA Volume:   50.20 ml  26.53 ml/m LA Vol (A4C):   76.8 ml 40.59 ml/m LA Biplane Vol: 88.3 ml 46.67 ml/m  AORTIC VALVE                     PULMONIC VALVE AV Area (Vmax):    1.70 cm      PV Vmax:       1.05 m/s AV Area (Vmean):  1.82 cm      PV Peak grad:  4.4 mmHg AV Area (VTI):     1.98 cm AV Vmax:           261.00 cm/s AV Vmean:          174.500 cm/s AV VTI:            0.626 m AV Peak Grad:      27.2 mmHg AV Mean Grad:      14.0 mmHg LVOT Vmax:         141.50 cm/s LVOT Vmean:        101.100 cm/s LVOT VTI:          0.394 m LVOT/AV VTI ratio: 0.63 AI PHT:            513 msec  AORTA Ao Root diam: 2.60 cm Ao Asc diam:  3.30 cm MITRAL VALVE                TRICUSPID VALVE MV Area (PHT): 2.78 cm     TR Peak grad:   41.5 mmHg MV Area VTI:   2.98 cm     TR Vmax:        322.00 cm/s MV Peak grad:  7.4 mmHg MV Mean grad:  3.0 mmHg     SHUNTS MV Vmax:       1.36 m/s     Systemic VTI:  0.39 m MV Vmean:      75.0 cm/s    Systemic Diam: 2.00 cm MV Decel Time: 273 msec MV E velocity: 120.00 cm/s MV A velocity: 130.00 cm/s MV E/A ratio:  0.92 Debbe Odea MD Electronically signed by Debbe Odea MD Signature Date/Time: 01/02/2023/1:25:23 PM    Final    CT ANGIO HEAD NECK W WO CM  Result Date: 01/02/2023 CLINICAL DATA:  Stroke/TIA, determine embolic source EXAM: CT ANGIOGRAPHY HEAD AND NECK WITH AND WITHOUT CONTRAST TECHNIQUE: Multidetector CT imaging of the head and neck was performed using the standard protocol during bolus administration of intravenous contrast. Multiplanar CT image reconstructions and MIPs were obtained to evaluate the vascular anatomy. Carotid stenosis measurements (when applicable) are obtained utilizing NASCET criteria, using the distal internal  carotid diameter as the denominator. RADIATION DOSE REDUCTION: This exam was performed according to the departmental dose-optimization program which includes automated exposure control, adjustment of the mA and/or kV according to patient size and/or use of iterative reconstruction technique. CONTRAST:  75mL OMNIPAQUE IOHEXOL 350 MG/ML SOLN COMPARISON:  None Available. FINDINGS: CT HEAD FINDINGS Brain: Remote right frontoparietal infarct with small superimposed acute infarct better characterized on recent MRI. No evidence of acute hemorrhage, mass lesion, midline shift or hydrocephalus. Chronic microvascular ischemic change. Vascular: See below. Skull: No acute fracture. Sinuses/Orbits: Clear sinuses.  No acute orbital findings. Other: No mastoid effusions. Review of the MIP images confirms the above findings CTA NECK FINDINGS Aortic arch: Great vessel origins are patent. Atherosclerosis of the aorta and great vessel origins. Right carotid system: Atherosclerosis involving the proximal common carotid artery with approximately 50% stenosis. Atherosclerosis at the carotid bifurcation without greater than 50% stenosis. Left carotid system: Atherosclerosis involving the proximal common carotid artery with approximately 50% stenosis. Atherosclerosis at the carotid bifurcation with approximately 60% stenosis of the ICA origin. Vertebral arteries: Severe stenosis of the vertebral artery origins due to atherosclerosis. Skeleton: Multilevel degenerative change. Other neck: No acute abnormality on limited assessment. Upper chest: Biapical pleuroparenchymal scarring and fibrotic changes. Review of the MIP images confirms the above  findings CTA HEAD FINDINGS Anterior circulation: Bilateral intracranial ICAs are patent. Moderate right paraclinoid ICA stenosis. Bilateral M1 MCAs are patent with mild-to-moderate atherosclerotic narrowing. Severe distal right M2 MCA stenosis. Severe stenosis versus short-segment occlusion of a  distal left M2 MCA vessel (for example series 10, image 176). Hypoplastic right A1 ACA. Severe stenosis of a distal right A4 ACA Posterior circulation: Bilateral intradural vertebral arteries and basilar artery are patent without significant stenosis. Moderate to severe right P2 PCA stenosis. Mild left P2 PCA stenosis. Venous sinuses: As permitted by contrast timing, patent. Review of the MIP images confirms the above findings IMPRESSION: CTA head: 1. Severe stenosis versus short-segment occlusion of a distal left M2 MCA vessel with poor intermittent distal opacification. 2. Severe distal right M2 and distal right A4 ACA stenosis. 3. Moderate right paraclinoid ICA stenosis. CTA neck: 1. Approximately 60% stenosis of the left ICA origin. 2. Approximately 50% stenosis of the proximal common carotid arteries bilaterally. 3. Severe stenosis of the vertebral artery origins. 4. Fibrotic changes in the visualized lung apices. Outpatient high-resolution CT chest could further characterize if clinically warranted. 5.  Aortic Atherosclerosis (ICD10-I70.0). Findings discussed with Dr. Prudy Feeler via telephone at 9:27 a.m. Electronically Signed   By: Feliberto Harts M.D.   On: 01/02/2023 09:41   MR BRAIN WO CONTRAST  Result Date: 01/01/2023 CLINICAL DATA:  Initial evaluation for neuro deficit, stroke suspected. EXAM: MRI HEAD WITHOUT CONTRAST TECHNIQUE: Multiplanar, multiecho pulse sequences of the brain and surrounding structures were obtained without intravenous contrast. COMPARISON:  Prior CT from earlier the same day as well as previous MRI from 10/13/2020. FINDINGS: Brain: Cerebral volume within normal limits. Patchy T2/FLAIR hyperintensity involving the periventricular deep white matter both cerebral hemispheres as well as the pons, consistent with chronic small vessel ischemic disease. Encephalomalacia and gliosis involving the right parieto-occipital region, consistent with a chronic ischemic infarct, posterior right MCA  distribution. Additional small remote cortical infarct noted at the left parietal lobe, left posterior MCA distribution. Few scattered small remote bilateral cerebellar infarcts. Approximate 2.5 cm focus of restricted diffusion seen involving the cortical to subcortical aspect of the parasagittal right parietal lobe, consistent with a small acute ischemic infarct (series 5, image 33). No associated hemorrhage or mass effect. No other evidence for acute or subacute ischemia. Gray-white matter differentiation otherwise maintained. No acute intracranial hemorrhage. Few scattered chronic micro hemorrhages noted about the pons and left cerebral hemisphere, likely hypertensive/small vessel related. No mass lesion, midline shift or mass effect. No hydrocephalus or extra-axial fluid collection. Pituitary gland and suprasellar region within normal limits. Vascular: Major intracranial vascular flow voids are maintained. Skull and upper cervical spine: Craniocervical junction within normal limits. Bone marrow signal intensity normal. No scalp soft tissue abnormality. Sinuses/Orbits: Prior bilateral ocular lens replacement. Paranasal sinuses are largely clear. Trace bilateral mastoid effusions noted, of doubtful significance. Other: None. IMPRESSION: 1. 2.5 cm acute ischemic nonhemorrhagic infarct involving the posterior parasagittal right parietal lobe. 2. Chronic right posterior MCA territory infarct, with additional small remote left parietal and bilateral cerebellar infarcts. 3. Underlying moderate chronic microvascular ischemic disease. Electronically Signed   By: Rise Mu M.D.   On: 01/01/2023 21:01   DG Chest 2 View  Result Date: 01/01/2023 CLINICAL DATA:  Weakness.  Cough. EXAM: CHEST - 2 VIEW COMPARISON:  12/24/2022.  CT, 10/16/2022. FINDINGS: Cardiac silhouette mildly enlarged. No mediastinal or hilar masses. No evidence of adenopathy. Heterogeneous bilateral interstitial thickening with areas of  intervening ground-glass opacity consistent with interstitial lung  disease and without significant change prior exams. No convincing pneumonia or pulmonary edema. Skeletal structures are intact. IMPRESSION: 1. No acute cardiopulmonary disease. Electronically Signed   By: Amie Portland M.D.   On: 01/01/2023 16:40   CT Head Wo Contrast  Result Date: 01/01/2023 CLINICAL DATA:  TIA, slurred speech EXAM: CT HEAD WITHOUT CONTRAST TECHNIQUE: Contiguous axial images were obtained from the base of the skull through the vertex without intravenous contrast. RADIATION DOSE REDUCTION: This exam was performed according to the departmental dose-optimization program which includes automated exposure control, adjustment of the mA and/or kV according to patient size and/or use of iterative reconstruction technique. COMPARISON:  12/18/2022 FINDINGS: Brain: No acute intracranial findings are seen. There are no signs of bleeding within the cranium. There is old infarct in right parietal lobe in right MCA distribution. Cortical sulci are prominent. There is decreased density in periventricular and subcortical white matter. Vascular: Scattered arterial calcifications are seen. Skull: No acute findings are seen. Postsurgical changes are noted in the optic globes. Sinuses/Orbits: Unremarkable. Other: None. IMPRESSION: No acute intracranial findings are seen in noncontrast CT brain. Old infarct is seen in right parietal lobe with no change. Atrophy. Small-vessel disease. Electronically Signed   By: Ernie Avena M.D.   On: 01/01/2023 15:20   DG Chest 2 View  Result Date: 12/24/2022 CLINICAL DATA:  Shortness of breath EXAM: CHEST - 2 VIEW COMPARISON:  CXR 12/18/22 FINDINGS: No pleural effusion. No pneumothorax. There is a hazy opacity in the right lung base, which could represent atelectasis or infection. Unchanged cardiac and mediastinal contours. No radiographically apparent displaced rib fractures. Visualized upper abdomen is  unremarkable. Vertebral body heights are maintained. Aortic atherosclerotic calcifications. IMPRESSION: Hazy opacity at the right lung base could represent atelectasis or infection. Electronically Signed   By: Lorenza Cambridge M.D.   On: 12/24/2022 16:01   CT Lumbar Spine Wo Contrast  Result Date: 12/18/2022 CLINICAL DATA:  Trauma EXAM: CT LUMBAR SPINE WITHOUT CONTRAST TECHNIQUE: Multidetector CT imaging of the lumbar spine was performed without intravenous contrast administration. Multiplanar CT image reconstructions were also generated. RADIATION DOSE REDUCTION: This exam was performed according to the departmental dose-optimization program which includes automated exposure control, adjustment of the mA and/or kV according to patient size and/or use of iterative reconstruction technique. COMPARISON:  None Available. FINDINGS: Segmentation: 5 lumbar type vertebrae. Alignment: Normal. Vertebrae: Mild chronic L1 height loss.  No acute abnormality. Paraspinal and other soft tissues: Calcific aortic atherosclerosis. Disc levels: L1-2: Unremarkable. L2-3: Moderate spinal canal stenosis due to combination of disc bulge, facet hypertrophy and ligamentum flavum redundancy. L3-4: Moderate facet hypertrophy and remote left laminectomy. No stenosis. L4-5: Disc bulge and moderate facet hypertrophy with right laminectomy. No spinal canal stenosis. Mild right and moderate left foraminal stenosis. L5-S1: Facet arthrosis and small disc bulge.  No stenosis. IMPRESSION: 1. No acute fracture or static subluxation of the lumbar spine. 2. Moderate spinal canal stenosis at L2-3 due to combination of disc bulge, facet hypertrophy and ligamentum flavum redundancy. 3. Moderate left L4-5 foraminal stenosis. Aortic Atherosclerosis (ICD10-I70.0). Electronically Signed   By: Deatra Robinson M.D.   On: 12/18/2022 02:24   DG Chest Portable 1 View  Result Date: 12/18/2022 CLINICAL DATA:  Recent fall with chest pain, initial encounter EXAM:  PORTABLE CHEST 1 VIEW COMPARISON:  08/07/2022 FINDINGS: Cardiac shadow is mildly enlarged. Aortic calcifications are seen. Diffuse fibrotic changes are again identified throughout both lungs. No focal confluent infiltrate or effusion is seen. No acute bony  abnormality is noted. IMPRESSION: Chronic fibrotic changes without acute abnormality. Electronically Signed   By: Alcide Clever M.D.   On: 12/18/2022 02:20   DG Elbow Complete Left  Result Date: 12/18/2022 CLINICAL DATA:  Recent fall with left elbow pain, initial encounter EXAM: LEFT ELBOW - COMPLETE 3+ VIEW COMPARISON:  None Available. FINDINGS: There is no evidence of fracture, dislocation, or joint effusion. There is no evidence of arthropathy or other focal bone abnormality. Soft tissues are unremarkable. IMPRESSION: No acute abnormality noted. Electronically Signed   By: Alcide Clever M.D.   On: 12/18/2022 02:19   CT HEAD WO CONTRAST ( )  Result Date: 12/18/2022 CLINICAL DATA:  Recent fall with headaches and neck pain, initial encounter EXAM: CT HEAD WITHOUT CONTRAST CT CERVICAL SPINE WITHOUT CONTRAST TECHNIQUE: Multidetector CT imaging of the head and cervical spine was performed following the standard protocol without intravenous contrast. Multiplanar CT image reconstructions of the cervical spine were also generated. RADIATION DOSE REDUCTION: This exam was performed according to the departmental dose-optimization program which includes automated exposure control, adjustment of the mA and/or kV according to patient size and/or use of iterative reconstruction technique. COMPARISON:  10/12/2022 FINDINGS: CT HEAD FINDINGS Brain: Encephalomalacia changes are noted in the right posterior parietal lobe consistent with prior infarct. These are stable in appearance from the prior exam. No acute hemorrhage, acute infarction or space-occupying mass lesion is noted. Vascular: No hyperdense vessel or unexpected calcification. Skull: Normal. Negative for  fracture or focal lesion. Sinuses/Orbits: No acute finding. Other: None. CT CERVICAL SPINE FINDINGS Alignment: Mild anterolisthesis of C3 on C4 is noted. Skull base and vertebrae: 7 cervical segments are well visualized. Vertebral body height is well maintained. Multilevel osteophytic changes and facet hypertrophic changes are seen. No acute fracture or acute facet abnormality is noted. Soft tissues and spinal canal: Surrounding soft tissue structures are within normal limits. Upper chest: Visualized lung apices demonstrate no acute abnormality. Other: None IMPRESSION: CT of the head: Chronic right posterior parietal lobe infarct. No acute intracranial abnormality noted. CT of the cervical spine: Multilevel degenerative change without acute abnormality. Electronically Signed   By: Alcide Clever M.D.   On: 12/18/2022 02:17   CT Cervical Spine Wo Contrast  Result Date: 12/18/2022 CLINICAL DATA:  Recent fall with headaches and neck pain, initial encounter EXAM: CT HEAD WITHOUT CONTRAST CT CERVICAL SPINE WITHOUT CONTRAST TECHNIQUE: Multidetector CT imaging of the head and cervical spine was performed following the standard protocol without intravenous contrast. Multiplanar CT image reconstructions of the cervical spine were also generated. RADIATION DOSE REDUCTION: This exam was performed according to the departmental dose-optimization program which includes automated exposure control, adjustment of the mA and/or kV according to patient size and/or use of iterative reconstruction technique. COMPARISON:  10/12/2022 FINDINGS: CT HEAD FINDINGS Brain: Encephalomalacia changes are noted in the right posterior parietal lobe consistent with prior infarct. These are stable in appearance from the prior exam. No acute hemorrhage, acute infarction or space-occupying mass lesion is noted. Vascular: No hyperdense vessel or unexpected calcification. Skull: Normal. Negative for fracture or focal lesion. Sinuses/Orbits: No acute  finding. Other: None. CT CERVICAL SPINE FINDINGS Alignment: Mild anterolisthesis of C3 on C4 is noted. Skull base and vertebrae: 7 cervical segments are well visualized. Vertebral body height is well maintained. Multilevel osteophytic changes and facet hypertrophic changes are seen. No acute fracture or acute facet abnormality is noted. Soft tissues and spinal canal: Surrounding soft tissue structures are within normal limits. Upper chest: Visualized lung apices  demonstrate no acute abnormality. Other: None IMPRESSION: CT of the head: Chronic right posterior parietal lobe infarct. No acute intracranial abnormality noted. CT of the cervical spine: Multilevel degenerative change without acute abnormality. Electronically Signed   By: Alcide Clever M.D.   On: 12/18/2022 02:17    Microbiology: Recent Results (from the past 240 hour(s))  Resp panel by RT-PCR (RSV, Flu A&B, Covid) Anterior Nasal Swab     Status: None   Collection Time: 12/24/22  4:00 PM   Specimen: Anterior Nasal Swab  Result Value Ref Range Status   SARS Coronavirus 2 by RT PCR NEGATIVE NEGATIVE Final   Influenza A by PCR NEGATIVE NEGATIVE Final   Influenza B by PCR NEGATIVE NEGATIVE Final    Comment: (NOTE) The Xpert Xpress SARS-CoV-2/FLU/RSV plus assay is intended as an aid in the diagnosis of influenza from Nasopharyngeal swab specimens and should not be used as a sole basis for treatment. Nasal washings and aspirates are unacceptable for Xpert Xpress SARS-CoV-2/FLU/RSV testing.  Fact Sheet for Patients: BloggerCourse.com  Fact Sheet for Healthcare Providers: SeriousBroker.it  This test is not yet approved or cleared by the [place] FDA and has been authorized for detection and/or diagnosis of SARS-CoV-2 by FDA under an Emergency Use Authorization (EUA). This EUA will remain in effect (meaning this test can be used) for the duration of the COVID-19 declaration under  Section 564(b)(1) of the Act, 21 U.S.C. section 360bbb-3(b)(1), unless the authorization is terminated or revoked.     Resp Syncytial Virus by PCR NEGATIVE NEGATIVE Final    Comment: (NOTE) Fact Sheet for Patients: BloggerCourse.com  Fact Sheet for Healthcare Providers: SeriousBroker.it  This test is not yet approved or cleared by the [place] FDA and has been authorized for detection and/or diagnosis of SARS-CoV-2 by FDA under an Emergency Use Authorization (EUA). This EUA will remain in effect (meaning this test can be used) for the duration of the COVID-19 declaration under Section 564(b)(1) of the Act, 21 U.S.C. section 360bbb-3(b)(1), unless the authorization is terminated or revoked.  Performed at [facility], [address], [place], [place] [address]      Labs: CBC: Recent Labs  Lab 01/01/23 1432 01/02/23 0359  WBC 10.2 8.9  HGB 9.5* 9.0*  HCT 32.2* 29.2*  MCV 84.1 81.8  PLT 403* 346   Basic Metabolic Panel: Recent Labs  Lab 01/01/23 1432 01/02/23 0359  NA 137 140  K 3.8 3.7  CL 102 104  CO2 26 27  GLUCOSE 74 98  BUN 12 11  CREATININE 1.10* 0.94  CALCIUM 9.0 9.1  MG  --  2.2  PHOS  --  4.8*   Liver Function Tests: Recent Labs  Lab 01/01/23 1432  AST 22  ALT 13  ALKPHOS 87  BILITOT 0.6  PROT 6.5  ALBUMIN 3.7   No results for input(s): "LIPASE", "AMYLASE" in the last 168 hours. No results for input(s): "AMMONIA" in the last 168 hours. Cardiac Enzymes: No results for input(s): "CKTOTAL", "CKMB", "CKMBINDEX", "TROPONINI" in the last 168 hours. BNP (last 3 results) Recent Labs    12/24/22 1558  BNP 125.1*   CBG: Recent Labs  Lab 01/01/23 1805 01/01/23 2318 01/02/23 0816 01/02/23 1129 01/02/23 1559  GLUCAP 85 108* 84 116* 91    Time spent: 35 minutes  Signed:  Gillis Santa  [facility]  ***01/02/2023 4:08 PM

## 2023-01-02 NOTE — Telephone Encounter (Signed)
Pharmacy Patient Advocate Encounter  Insurance verification completed.    The patient is insured through Limited Income Medicare Part D   The patient is currently admitted and ran test claims for the following: dabigatran (Pradaxa) .  Copays and coinsurance results were relayed to Inpatient clinical team.

## 2023-01-02 NOTE — Progress Notes (Signed)
*  PRELIMINARY RESULTS* Echocardiogram 2D Echocardiogram has been performed.  [name] Douglas 01/02/2023, 1:03 PM

## 2023-01-02 NOTE — TOC Benefit Eligibility Note (Signed)
Patient Product/process development scientist completed.    The patient is currently admitted and upon discharge could be taking dabigatran (Pradaxa) 150 mg capsules.  The current 30 day co-pay is $1.55.   The patient is insured through Limited Income Medicare Part D   This test claim was processed through [facility]- copay amounts may vary at other pharmacies due to pharmacy/plan contracts, or as the patient moves through the different stages of their insurance plan.  Roland Earl, CPHT Pharmacy Patient Advocate Specialist [facility] Patient Advocate Team Direct Number: [phone]  Fax: [phone]

## 2023-01-02 NOTE — Progress Notes (Signed)
Pt being d/c home . Pt left with her daughter. Pt s pharmacy is not able to dispense her pradaxa tabs tonight. Pharmacy provided one time dose for tonight for her. Pradaxa tab given to the pt with discharge packet

## 2023-01-02 NOTE — Consult Note (Addendum)
Neurology Consultation  Reason for Consult: Stroke Referring Physician: Dr. Lucianne Muss  CC: Slurred speech, weakness  History is obtained from: Patient, chart  HPI: [name] Douglas is a 82 y.o. female who has a past medical history of prior TIA and stroke with unknown residual deficits, atrial fibrillation on Eliquis, hypothyroidism, hypertension, NIDDM, heart failure with preserved ejection fraction presented to the emergency department with reports of slurred speech and weakness. Symptoms were discovered sometimes around 12:30 PM yesterday along with some generalized weakness and unsteady gait but the slurred speech symptoms had resolved by the time she arrived to the emergency room around 2:15 PM. The patient reports that she was feeling just generally unwell and her speech was getting difficult to come out and was slurred making people around her body that she might have had a new stroke. She does not report any preceding illness or sickness. An MRI was performed during this hospitalization that showed a small area of stroke adjacent to her large prior stroke, for which neurology was consulted. See details of imaging reports below.  LKW: Prior to 12:30 PM yesterday IV thrombolysis given?: no, symptoms had resolved per the ED notes EVT: No ELVO Premorbid modified Rankin scale (mRS):4   ROS: Full ROS was performed and is negative except as noted in the HPI.   Past Medical History:  Diagnosis Date   Anxiety    Arthritis    "shoulders" (08/20/2014)   Asthma    Chronic lower back pain    Complication of anesthesia    "agitated & restless after knee replacement in 2008"   COPD (chronic obstructive pulmonary disease) (HCC)    DDD (degenerative disc disease), lumbar    Depression    Diabetes mellitus without complication (HCC)    Diverticulosis    Family history of adverse reaction to anesthesia    GERD (gastroesophageal reflux disease)    High cholesterol    Hypertension     Hypothyroidism    Memory loss    mild   On home oxygen therapy    "2L at night" (08/20/2014)   PAF (paroxysmal atrial fibrillation) (HCC)    Peripheral vascular disease (HCC)    Pre-diabetes    Shortness of breath dyspnea    with exertion   Stroke (HCC)    mild, no deficits     Family History  Problem Relation Age of Onset   Diabetes Mother    Hypertension Mother    Lung cancer Mother    Congestive Heart Failure Father    Congestive Heart Failure Sister      Social History:   reports that she has been smoking cigarettes. She has a 13.25 pack-year smoking history. She has never used smokeless tobacco. She reports that she does not drink alcohol and does not use drugs.  Medications  Current Facility-Administered Medications:    [START ON 01/03/2023]  stroke: early stages of recovery book, , Does not apply, Once, Milon Dikes, MD   acetaminophen (TYLENOL) tablet 650 mg, 650 mg, Oral, Q6H PRN **OR** acetaminophen (TYLENOL) suppository 650 mg, 650 mg, Rectal, Q6H PRN, Cox, Amy N, DO   apixaban (ELIQUIS) tablet 5 mg, 5 mg, Oral, BID, Cox, Amy N, DO, 5 mg at 01/02/23 0847   atorvastatin (LIPITOR) tablet 80 mg, 80 mg, Oral, QPM, Cox, Amy N, DO, 80 mg at 01/01/23 1806   benzonatate (TESSALON) capsule 100 mg, 100 mg, Oral, BID PRN, Cox, Amy N, DO   donepezil (ARICEPT) tablet 10 mg, 10 mg, Oral,  Annamaria Helling, MD, 10 mg at 01/02/23 0042   flecainide (TAMBOCOR) tablet 50 mg, 50 mg, Oral, BID, Andris Baumann, MD, 50 mg at 01/02/23 0848   hydrALAZINE (APRESOLINE) injection 5 mg, 5 mg, Intravenous, Q8H PRN, Cox, Amy N, DO   insulin aspart (novoLOG) injection 0-15 Units, 0-15 Units, Subcutaneous, TID WC, Cox, Amy N, DO   insulin aspart (novoLOG) injection 0-5 Units, 0-5 Units, Subcutaneous, QHS, Cox, Amy N, DO   levothyroxine (SYNTHROID) tablet 88 mcg, 88 mcg, Oral, Q0600, Cox, Amy N, DO, 88 mcg at 01/02/23 0602   LORazepam (ATIVAN) injection 1 mg, 1 mg, Intravenous, PRN, Cox, Amy N,  DO, 1 mg at 01/01/23 1958   nicotine (NICODERM CQ - dosed in mg/24 hours) patch 14 mg, 14 mg, Transdermal, Daily PRN, Cox, Amy N, DO   ondansetron (ZOFRAN) tablet 4 mg, 4 mg, Oral, Q6H PRN **OR** ondansetron (ZOFRAN) injection 4 mg, 4 mg, Intravenous, Q6H PRN, Cox, Amy N, DO   oxyCODONE (Oxy IR/ROXICODONE) immediate release tablet 2.5 mg, 2.5 mg, Oral, Q6H PRN, Cox, Amy N, DO   senna-docusate (Senokot-S) tablet 1 tablet, 1 tablet, Oral, QHS PRN, Cox, Amy N, DO   tiZANidine (ZANAFLEX) tablet 2 mg, 2 mg, Oral, BID PRN, Cox, Amy N, DO   traZODone (DESYREL) tablet 25 mg, 25 mg, Oral, QHS, Cox, Amy N, DO, 25 mg at 01/02/23 0004   venlafaxine XR (EFFEXOR-XR) 24 hr capsule 150 mg, 150 mg, Oral, Daily, Cox, Amy N, DO, 150 mg at 01/02/23 0847  Exam: Current vital signs: BP (!) 132/104 (BP Location: Right Arm)   Pulse 63   Temp (!) 97.5 F (36.4 C)   Resp 18   Ht 5\' 1"  (1.549 m)   Wt 91.1 kg   SpO2 98%   BMI 37.95 kg/m  Vital signs in last 24 hours: Temp:  [97.5 F (36.4 C)-98.4 F (36.9 C)] 97.5 F (36.4 C) (05/15 0812) Pulse Rate:  [63-79] 63 (05/15 0812) Resp:  [16-18] 18 (05/15 0812) BP: (126-164)/(38-104) 132/104 (05/15 0812) SpO2:  [88 %-100 %] 98 % (05/15 0812) Weight:  [91.1 kg-97.1 kg] 91.1 kg (05/14 2320) General: Awake alert in no distress HEENT: Normocephalic atraumatic Lungs: Clear Cardiovascular: Regular rhythm Abdomen nondistended nontender Neurological exam Awake alert oriented x 3 No dysarthria No aphasia Cranial nerves 2-12 intact Motor examination with no drift in any of the 4 extremities Sensation intact to light touch Coordination examination reveals no dysmetria NIH stroke scale-0   Labs I have reviewed labs in epic and the results pertinent to this consultation are:  CBC    Component Value Date/Time   WBC 8.9 01/02/2023 0359   RBC 3.57 (L) 01/02/2023 0359   HGB 9.0 (L) 01/02/2023 0359   HGB 11.9 03/05/2022 1530   HCT 29.2 (L) 01/02/2023 0359    HCT 37.0 03/05/2022 1530   PLT 346 01/02/2023 0359   PLT 317 03/05/2022 1530   MCV 81.8 01/02/2023 0359   MCV 89 03/05/2022 1530   MCV 91 08/04/2014 0356   MCH 25.2 (L) 01/02/2023 0359   MCHC 30.8 01/02/2023 0359   RDW 15.8 (H) 01/02/2023 0359   RDW 13.3 03/05/2022 1530   RDW 14.9 (H) 08/04/2014 0356   LYMPHSABS 1.1 12/24/2022 1558   LYMPHSABS 1.6 05/11/2021 1425   LYMPHSABS 1.4 08/04/2014 0356   MONOABS 0.6 12/24/2022 1558   MONOABS 0.7 08/04/2014 0356   EOSABS 0.2 12/24/2022 1558   EOSABS 0.3 05/11/2021 1425   EOSABS 0.2  08/04/2014 0356   BASOSABS 0.1 12/24/2022 1558   BASOSABS 0.1 05/11/2021 1425   BASOSABS 0.0 08/04/2014 0356    CMP     Component Value Date/Time   NA 140 01/02/2023 0359   NA 141 03/05/2022 1530   NA 143 08/04/2014 0356   K 3.7 01/02/2023 0359   K 4.0 08/07/2014 1123   CL 104 01/02/2023 0359   CL 110 (H) 08/04/2014 0356   CO2 27 01/02/2023 0359   CO2 28 08/04/2014 0356   GLUCOSE 98 01/02/2023 0359   GLUCOSE 99 08/04/2014 0356   BUN 11 01/02/2023 0359   BUN 17 03/05/2022 1530   BUN 3 (L) 08/04/2014 0356   CREATININE 0.94 01/02/2023 0359   CREATININE 0.69 08/08/2014 0358   CALCIUM 9.1 01/02/2023 0359   CALCIUM 7.7 (L) 08/04/2014 0356   PROT 6.5 01/01/2023 1432   PROT 5.8 (L) 06/26/2021 1136   PROT 5.8 (L) 08/02/2014 1948   ALBUMIN 3.7 01/01/2023 1432   ALBUMIN 3.9 06/26/2021 1136   ALBUMIN 2.5 (L) 08/02/2014 1948   AST 22 01/01/2023 1432   AST 31 08/02/2014 1948   ALT 13 01/01/2023 1432   ALT 27 08/02/2014 1948   ALKPHOS 87 01/01/2023 1432   ALKPHOS 100 08/02/2014 1948   BILITOT 0.6 01/01/2023 1432   BILITOT 0.2 06/26/2021 1136   BILITOT 0.4 08/02/2014 1948   GFRNONAA >60 01/02/2023 0359   GFRNONAA >60 08/08/2014 0358   GFRNONAA >60 08/23/2013 1439   GFRAA >60 11/06/2019 1218   GFRAA >60 08/08/2014 0358   GFRAA >60 08/23/2013 1439    Lipid Panel     Component Value Date/Time   CHOL 108 06/26/2021 1136   TRIG 123 06/26/2021 1136    HDL 46 06/26/2021 1136   CHOLHDL 2.3 06/26/2021 1136   CHOLHDL 2.6 10/13/2020 0442   VLDL 13 10/13/2020 0442   LDLCALC 40 06/26/2021 1136    A1c 6.24 August 2022 Lipid panel pending   Imaging I have reviewed the images obtained:  CT-head on arrival-no acute degraded finding.  Old infarcts in the right parietal lobe with no change.  Atrophy and small vessel disease seen.  MRI examination of the brain: 2.5 cm acute ischemic nonhemorrhagic infarct in the posterior parasagittal right parietal lobe adjacent to the chronic stroke.  Chronic right posterior MCA territory infarct with additional small remote left parietal and bilateral cerebellar infarct seen.  Underlying moderate chronic microvascular ischemic disease.  CT angiography head and neck: Severe gnosis versus short segment occlusion of the distal left M2 MCA vessel with poor intermittent distal opacification.  Severe distal right M2 and distal right A4 ACA stenosis.  Moderate right paraclinoid ICA stenosis.  CTA of the neck shows 60% left ICA origin and 50% stenosis of the proximal common carotid arteries bilaterally.  Severe stenosis of the vert artery origins.  Fibrotic changes in the visual lung apices-outpatient high-resolution CT of the chest recommended.   Assessment: 82 year old with past history of prior strokes, A-fib on anticoagulation amongst other comorbidities presenting for evaluation of brief episode of slurred speech and generalized weakness, for which she was evaluated further with imaging where MRI of the brain showed a small acute ischemic infarct in the posterior parasagittal right parietal lobe adjacent to her prior right parietal stroke area.  Appearance of the stroke is suspicious for an embolic process. She reports compliance to Eliquis. CTA head and neck with multifocal intracranial stenoses involving the left M2, right M2, right A4 and right paraclinoid ICA  in the head and 60% stenosis of the left ICA origin and  50% stenosis of the proximal common carotid arteries bilaterally.  Impression: Acute ischemic stroke-likely embolic  Recommendations: 2D echo pending-please call if any concerning findings although even if there is a thrombus noted, treatment will be anticoagulation which she is going to continue anyway. As for anticoagulation I would recommend switching her to Pradaxa, considering the stroke to be an Eliquis failure. PT OT speech therapy Outpatient neurology follow-up in 8 to 12 weeks-has followed with Dr. Herma Carson. Potter in the past-can be referred back to see him in [place] [facility] neurology clinic. Plan was relayed to Dr. Lucianne Muss.  Neurology will be available as needed. -- Milon Dikes, MD Neurologist [facility] Pager: [phone]   ADDENDUM Talked to daughter, who reports that the patient has also been exhibiting some issues with her behavior and memory.  She reports that she has had trouble with her short-term memory now for a while.  Family suspects that she might be developing dementia.  I reported to them that they need to make sure that this gets addressed during the outpatient neurology follow-ups-she will do full neuropsychological evaluation/testing.  It is likely that she has Alzheimer's plus vascular dementia given her history of strokes but formal testing will be done outpatient. Also her vitamin B12 is excessively low-she needs replenishment parenterally and orally on an ongoing basis to keep the levels greater than 400. I had a detailed discussion with the daughter and answered all her questions to the best of my ability. Please call back with questions as needed.   -- Milon Dikes, MD Neurologist [facility] Pager: [phone]

## 2023-01-02 NOTE — Consult Note (Signed)
ANTICOAGULATION CONSULT NOTE - Initial Consult  Pharmacy Consult for dabigatran Indication: atrial fibrillation  Allergies  Allergen Reactions   Heparin Nausea And Vomiting    Patient Measurements: Height: 5\' 1"  (154.9 cm) Weight: 91.1 kg (200 lb 13.4 oz) IBW/kg (Calculated) : 47.8  Vital Signs: Temp: 97.5 F (36.4 C) (05/15 0812) Temp Source: Oral (05/15 0320) BP: 132/104 (05/15 0812) Pulse Rate: 63 (05/15 0812)  Labs: Recent Labs    01/01/23 1432 01/02/23 0359  HGB 9.5* 9.0*  HCT 32.2* 29.2*  PLT 403* 346  CREATININE 1.10* 0.94    Estimated Creatinine Clearance: 46.6 mL/min (by C-G formula based on SCr of 0.94 mg/dL).   Medical History: Past Medical History:  Diagnosis Date   Anxiety    Arthritis    "shoulders" (08/20/2014)   Asthma    Chronic lower back pain    Complication of anesthesia    "agitated & restless after knee replacement in 2008"   COPD (chronic obstructive pulmonary disease) (HCC)    DDD (degenerative disc disease), lumbar    Depression    Diabetes mellitus without complication (HCC)    Diverticulosis    Family history of adverse reaction to anesthesia    GERD (gastroesophageal reflux disease)    High cholesterol    Hypertension    Hypothyroidism    Memory loss    mild   On home oxygen therapy    "2L at night" (08/20/2014)   PAF (paroxysmal atrial fibrillation) (HCC)    Peripheral vascular disease (HCC)    Pre-diabetes    Shortness of breath dyspnea    with exertion   Stroke (HCC)    mild, no deficits    Medications:  Apixaban 5 mg twice daily  Assessment: 82 y.o. female with PMH TIA x 2, HTN, HLD, DM, Afib on Eliquis, COPD (2 L baseline) who presents with stroke. MRI shows acute ischemic nonhemorrhagic infarct in parietal lobe adjacent to chronic stroke. No thrombolytics were given. Per neurology, there is concern for an embolic process which is concerning given that patient is adherent to apixaban. Pharmacy has been consulted to  manage switching from Eliquis to Pradaxa.  Goal of Therapy:   Monitor platelets by anticoagulation protocol: Yes   Plan:  Discontinue apixaban Initiate dabigatran 150 mg PO twice daily. First dose tonight, 5/15 @ 2200 Monitor CBC at least weekly while inpatient and taking a DOAC  Will M. Dareen Piano, PharmD PGY-1 Pharmacy Resident 01/02/2023 1:48 PM

## 2023-01-02 NOTE — Plan of Care (Signed)
  Problem: Fluid Volume: Goal: Ability to maintain a balanced intake and output will improve Outcome: Progressing   Problem: Coping: Goal: Ability to adjust to condition or change in health will improve Outcome: Progressing   Problem: Tissue Perfusion: Goal: Adequacy of tissue perfusion will improve Outcome: Progressing   Problem: Education: Goal: Knowledge of General Education information will improve Description: Including pain rating scale, medication(s)/side effects and non-pharmacologic comfort measures Outcome: Progressing   Problem: Safety: Goal: Ability to remain free from injury will improve Outcome: Progressing   Problem: Skin Integrity: Goal: Risk for impaired skin integrity will decrease Outcome: Progressing   Problem: Ischemic Stroke/TIA Tissue Perfusion: Goal: Complications of ischemic stroke/TIA will be minimized Outcome: Progressing

## 2023-01-02 NOTE — Evaluation (Addendum)
Clinical/Bedside Swallow Evaluation Patient Details  Name: [name] Douglas MRN: [id number] Date of Birth: 1940/12/03  Today's Date: 01/02/2023 Time: SLP Start Time (ACUTE ONLY): 1310 SLP Stop Time (ACUTE ONLY): 1350 SLP Time Calculation (min) (ACUTE ONLY): 40 min  Past Medical History:  Past Medical History:  Diagnosis Date   Anxiety    Arthritis    "shoulders" (08/20/2014)   Asthma    Chronic lower back pain    Complication of anesthesia    "agitated & restless after knee replacement in 2008"   COPD (chronic obstructive pulmonary disease) (HCC)    DDD (degenerative disc disease), lumbar    Depression    Diabetes mellitus without complication (HCC)    Diverticulosis    Family history of adverse reaction to anesthesia    GERD (gastroesophageal reflux disease)    High cholesterol    Hypertension    Hypothyroidism    Memory loss    mild   On home oxygen therapy    "2L at night" (08/20/2014)   PAF (paroxysmal atrial fibrillation) (HCC)    Peripheral vascular disease (HCC)    Pre-diabetes    Shortness of breath dyspnea    with exertion   Stroke (HCC)    mild, no deficits   Past Surgical History:  Past Surgical History:  Procedure Laterality Date   CARDIAC CATHETERIZATION  ~ 2007   EXCISIONAL HEMORRHOIDECTOMY  1960's   EYE SURGERY Bilateral    Cataract extraction with IOL   KNEE ARTHROSCOPY Left X 2 <2008   KNEE ARTHROSCOPY WITH SUBCHONDROPLASTY Right 04/19/2016   Procedure: KNEE ARTHROSCOPY WITH SUBCHONDROPLASTY, PARTIAL MENISCECTOMY;  Surgeon: Kennedy Bucker, MD;  Location: [facility] ORS;  Service: Orthopedics;  Laterality: Right;   LOOP RECORDER INSERTION N/A 01/02/2018   Procedure: LOOP RECORDER INSERTION;  Surgeon: Duke Salvia, MD;  Location: [facility] INVASIVE CV LAB;  Service: Cardiovascular;  Laterality: N/A;   LUMBAR LAMINECTOMY/DECOMPRESSION MICRODISCECTOMY N/A 02/06/2021   Procedure: Lumbar Three-Four, Lumbar Four-Five Laminectomy and Foraminotomy;  Surgeon: Barnett Abu,  MD;  Location: [facility] OR;  Service: Neurosurgery;  Laterality: N/A;   SHOULDER OPEN ROTATOR CUFF REPAIR Right 2001   TONSILLECTOMY  ~ 1950   TOTAL KNEE ARTHROPLASTY Left 2008   TOTAL KNEE ARTHROPLASTY Right 07/03/2016   Procedure: TOTAL KNEE ARTHROPLASTY;  Surgeon: Kennedy Bucker, MD;  Location: [facility] ORS;  Service: Orthopedics;  Laterality: Right;   TUBAL LIGATION  1973   VAGINAL HYSTERECTOMY  1974   HPI:  Patient is an 82 yo female that presented to the ED for weakness, slurred speech -- slurred speech resolved post admit to the ED per pt. Imaging showed "small 2.5 cm acute ischemic nonhemorrhagic infarct involving the  posterior parasagittal right parietal lobe, Chronic right posterior MCA territory infarct, with additional small remote left parietal and bilateral cerebellar infarcts".   PMH of GERD, TIAs, DM, HTN, afib, HLD.  CXR: No acute cardiopulmonary disease.   ALSO, per Neurology note, Family reports "patient has also been exhibiting some issues with her behavior and memory.  She reports that she has had trouble with her short-term memory now for a while.  Family suspects that she might be developing dementia.  I reported to them that they need to make sure that this gets addressed during the outpatient neurology follow-ups-she will have a full neuropsychological evaluation/testing then. It is likely that she has Alzheimer's plus vascular Dementia given her history of strokes but formal testing will be done outpatient.".     Assessment / Plan /  Recommendation  Clinical Impression   Pt seen for BSE today. pt awake, verbal and feeding herself ice chips watching TV. Son in room. Pt engaged easily w/ this SLP.  She stated she does not wear her Dentures when eating. OF NOTE: pt c/o a dry cough; "it wakes me up during the night and I cough and cough". Son and pt DENIED coughing occurred w/ eating/drinking and stated that "ice chips makes it better". Pt does have dx'd GERD and is NOT on a PPI per chart.   NSG reported good tolerance of swallowing Pills w/ water today; eating meals w/out difficulty. Noted CXR results were negative. On RA, afebrile. WBC wnl.  Pt appears to present w/ functional oropharyngeal phase swallow w/ No oropharyngeal phase dysphagia noted, No sensorimotor deficits noted. Pt consumed po trials w/ No overt, clinical s/s of aspiration during po trials.  Pt appears at reduced risk for aspiration following general aspiration precautions.  However, pt does have the challenging factors of GERD and Cognitive decline that could impact her oropharyngeal swallowing and increase risk for aspiration, dysphagia as well as decreased oral intake overall.   During po trials, pt consumed all consistencies w/ no overt coughing, decline in vocal quality, or change in respiratory presentation during/post trials. No decline in O2 sats, 98%. Oral phase appeared grossly [facility] w/ timely bolus management, mastication, and control of bolus propulsion for A-P transfer for swallowing in setting of edentulous status. Oral clearing achieved w/ all trial consistencies -- moistened, soft foods given. OM Exam appeared [facility] w/ no unilateral weakness noted. Speech Clear. Pt fed self w/ setup support.   Recommend continue a fairly Regular consistency diet w/ well-Cut meats, moistened foods; Thin liquids. Recommend general aspiration and REFLUX precautions(baseline GERD). Reduce distractions during meals. Pills w/ water or WHOLE in Puree for safer, easier swallowing if needed. Recommend f/u w/ GI for GERD management.  ADDENDUM: re: pt's speech-language abilities, pt was screened during the BSE. Pt exhibited functional communication w/ clear speech; 100% intelligibility. She made her wants/needs known, participated in conversation, and followed instructions appropriately. No ST needs indicated. See Neurology note for recommended f/u POST D/C for Neuropsychological assessment in setting of Suspected Dementia.   Education  given on Pills in Puree; food consistencies and easy to eat options; general aspiration precautions to pt and Son. NSG to reconsult if any new needs arise. NSG updated, agreed. MD updated. Recommend discussing the chronic cough w/ PCP to explore further -- any relation to her GERD since some of the coughing occurs during the night? GI f/u needed?  SLP Visit Diagnosis: Dysphagia, unspecified (R13.10)    Aspiration Risk   (reduced following general precautions)    Diet Recommendation   a fairly Regular consistency diet w/ well-Cut meats, moistened foods; Thin liquids. Recommend general aspiration and REFLUX precautions(baseline GERD). Reduce distractions during meals  Medication Administration: Whole meds with liquid (vs Whole in a puree if easier)    Other  Recommendations Recommended Consults:  (n/a) Oral Care Recommendations: Oral care BID;Patient independent with oral care (setup)    Recommendations for follow up therapy are one component of a multi-disciplinary discharge planning process, led by the attending physician.  Recommendations may be updated based on patient status, additional functional criteria and insurance authorization.  Follow up Recommendations No SLP follow up      Assistance Recommended at Discharge  PRN  Functional Status Assessment Patient has not had a recent decline in their functional status  Frequency and Duration  (  n/a)   (n/a)       Prognosis Prognosis for improved oropharyngeal function: Good Barriers to Reach Goals: Time post onset;Severity of deficits Barriers/Prognosis Comment: Cognitive decline; Edentulous status baseline      Swallow Study   General Date of Onset: 01/01/23 HPI: Patient is an 82 yo female that presented to the ED for weakness, slurred speech -- slurred speech resolved post admit to the ED per pt. Imaging showed "small 2.5 cm acute ischemic nonhemorrhagic infarct involving the  posterior parasagittal right parietal lobe, Chronic  right posterior MCA territory infarct, with additional small remote left parietal and bilateral cerebellar infarcts".   PMH of TIAs, DM, HTN, afib, HLD.  CXR: No acute cardiopulmonary disease.  ALSO, per Neurology note, Family reports "patient has also been exhibiting some issues with her behavior and memory.  She reports that she has had trouble with her short-term memory now for a while.  Family suspects that she might be developing dementia.  I reported to them that they need to make sure that this gets addressed during the outpatient neurology follow-ups-she will have a full neuropsychological evaluation/testing then. It is likely that she has Alzheimer's plus vascular Dementia given her history of strokes but formal testing will be done outpatient.". Type of Study: Bedside Swallow Evaluation Previous Swallow Assessment: none Diet Prior to this Study: Regular;Thin liquids (Level 0) Temperature Spikes Noted: No (wbc wnl) Respiratory Status: Room air History of Recent Intubation: No Behavior/Cognition: Alert;Cooperative;Pleasant mood;Distractible;Requires cueing (baseline Cognitive decline per chart) Oral Cavity Assessment: Within Functional Limits Oral Care Completed by SLP: Recent completion by staff Oral Cavity - Dentition: Edentulous (does not wear her Dentures) Vision: Functional for self-feeding Self-Feeding Abilities: Needs set up;Able to feed self Patient Positioning: Upright in bed Baseline Vocal Quality: Normal Volitional Cough: Strong Volitional Swallow: Able to elicit    Oral/Motor/Sensory Function Overall Oral Motor/Sensory Function: Within functional limits   Ice Chips Ice chips: Within functional limits Presentation: Spoon;Self Fed (1 trial)   Thin Liquid Thin Liquid: Within functional limits Presentation: Self Fed;Straw (~4 ozs)    Nectar Thick Nectar Thick Liquid: Not tested   Honey Thick Honey Thick Liquid: Not tested   Puree Puree: Within functional  limits Presentation: Self Fed;Spoon (8+ trials)   Solid     Solid: Within functional limits Presentation: Self Fed (4+ trials) Other Comments: moistened        Jerilynn Som, MS, CCC-SLP Speech Language Pathologist Rehab Services; [facility] [place] [phone] (ascom) Scheryl Sanborn 01/02/2023,3:33 PM

## 2023-01-02 NOTE — Evaluation (Signed)
Physical Therapy Evaluation Patient Details Name: [name] Douglas MRN: [id number] DOB: 1941/03/09 Today's Date: 01/02/2023  History of Present Illness  Patient is an 82 yo female that presented to the ED for weakness, slurred speech. Imaging showed "2.5 cm acute ischemic nonhemorrhagic infarct involving the  posterior parasagittal right parietal lobe, Chronic right posterior MCA territory infarct, with additional small remote left parietal and bilateral cerebellar infarcts". PMH of TIA, DM, HTN, afib, HLD.   Clinical Impression  Patient alert, up in bathroom with OT upon PT arrival, oriented x4. She stated at baseline she is modI with her walker, her daughter assists with IADLs including medication management. She was able to transfer and ambulate with supervision and RW, some fatigue and deconditioning noted. Pt reported that she does feel more worn out still than baseline. BLE WFLs and symmetrical.  Overall the patient demonstrated deficits (see "PT Problem List") that impede the patient's functional abilities, safety, and mobility and would benefit from skilled PT intervention. Recommendation is to continue skilled PT services to maximize function, endurance, and activity tolerance.        Recommendations for follow up therapy are one component of a multi-disciplinary discharge planning process, led by the attending physician.  Recommendations may be updated based on patient status, additional functional criteria and insurance authorization.  Follow Up Recommendations       Assistance Recommended at Discharge Intermittent Supervision/Assistance  Patient can return home with the following  Assistance with cooking/housework;Assist for transportation;Direct supervision/assist for medications management;Help with stairs or ramp for entrance    Equipment Recommendations None recommended by PT  Recommendations for Other Services       Functional Status Assessment Patient has had a recent  decline in their functional status and demonstrates the ability to make significant improvements in function in a reasonable and predictable amount of time.     Precautions / Restrictions Precautions Precautions: Fall Restrictions Weight Bearing Restrictions: No      Mobility  Bed Mobility               General bed mobility comments: pt up with OT from bathroom    Transfers Overall transfer level: Needs assistance Equipment used: Rolling walker (2 wheels) Transfers: Sit to/from Stand Sit to Stand: Supervision                Ambulation/Gait Ambulation/Gait assistance: Supervision Gait Distance (Feet): 50 Feet Assistive device: Rolling walker (2 wheels)         General Gait Details: decreased step length/height but no LOB  Stairs            Wheelchair Mobility    Modified Rankin (Stroke Patients Only)       Balance Overall balance assessment: Needs assistance Sitting-balance support: Feet supported Sitting balance-Leahy Scale: Good     Standing balance support: Bilateral upper extremity supported Standing balance-Leahy Scale: Fair                               Pertinent Vitals/Pain Pain Assessment Pain Assessment: No/denies pain    Home Living Family/patient expects to be discharged to:: Private residence Living Arrangements: Alone Available Help at Discharge: Family;Available PRN/intermittently Type of Home: Apartment Home Access: Level entry       Home Layout: One level Home Equipment: Rollator (4 wheels);Grab bars - toilet Additional Comments: recent HHPT    Prior Function Prior Level of Function : Independent/Modified Independent;History of Falls (last six months)  Hand Dominance        Extremity/Trunk Assessment   Upper Extremity Assessment Upper Extremity Assessment: Defer to OT evaluation    Lower Extremity Assessment Lower Extremity Assessment: RLE deficits/detail;LLE  deficits/detail RLE Deficits / Details: grossly 4/5 LLE Deficits / Details: grossly 4/5       Communication      Cognition Arousal/Alertness: Awake/alert Behavior During Therapy: WFL for tasks assessed/performed Overall Cognitive Status: Within Functional Limits for tasks assessed                                          General Comments      Exercises     Assessment/Plan    PT Assessment Patient needs continued PT services  PT Problem List Decreased strength;Decreased mobility;Decreased activity tolerance;Decreased balance       PT Treatment Interventions DME instruction;Therapeutic activities;Gait training;Therapeutic exercise;Patient/family education;Stair training;Balance training;Functional mobility training;Neuromuscular re-education    PT Goals (Current goals can be found in the Care Plan section)  Acute Rehab PT Goals Patient Stated Goal: to get stronger, have more endurance PT Goal Formulation: With patient Time For Goal Achievement: 01/16/23 Potential to Achieve Goals: Good    Frequency Min 2X/week     Co-evaluation               AM-PAC PT "6 Clicks" Mobility  Outcome Measure Help needed turning from your back to your side while in a flat bed without using bedrails?: None Help needed moving from lying on your back to sitting on the side of a flat bed without using bedrails?: None Help needed moving to and from a bed to a chair (including a wheelchair)?: None Help needed standing up from a chair using your arms (e.g., wheelchair or bedside chair)?: None Help needed to walk in hospital room?: None Help needed climbing 3-5 steps with a railing? : A Little 6 Click Score: 23    End of Session Equipment Utilized During Treatment: Gait belt;Oxygen Activity Tolerance: Patient tolerated treatment well Patient left: in chair;with chair alarm set;with call bell/phone within reach Nurse Communication: Mobility status PT Visit Diagnosis:  Other abnormalities of gait and mobility (R26.89);Muscle weakness (generalized) (M62.81)    Time: 1610-9604 PT Time Calculation (min) (ACUTE ONLY): 10 min   Charges:   PT Evaluation $PT Eval Low Complexity: 1 Low          Olga Coaster PT, DPT 11:42 AM,01/02/23

## 2023-01-03 DIAGNOSIS — M25562 Pain in left knee: Secondary | ICD-10-CM | POA: Diagnosis not present

## 2023-01-03 DIAGNOSIS — R531 Weakness: Secondary | ICD-10-CM | POA: Diagnosis not present

## 2023-01-03 DIAGNOSIS — G8314 Monoplegia of lower limb affecting left nondominant side: Secondary | ICD-10-CM | POA: Diagnosis not present

## 2023-01-03 DIAGNOSIS — E119 Type 2 diabetes mellitus without complications: Secondary | ICD-10-CM | POA: Diagnosis not present

## 2023-01-03 DIAGNOSIS — I10 Essential (primary) hypertension: Secondary | ICD-10-CM | POA: Diagnosis not present

## 2023-01-03 DIAGNOSIS — M189 Osteoarthritis of first carpometacarpal joint, unspecified: Secondary | ICD-10-CM | POA: Diagnosis not present

## 2023-01-07 DIAGNOSIS — M25562 Pain in left knee: Secondary | ICD-10-CM | POA: Diagnosis not present

## 2023-01-07 DIAGNOSIS — M189 Osteoarthritis of first carpometacarpal joint, unspecified: Secondary | ICD-10-CM | POA: Diagnosis not present

## 2023-01-07 DIAGNOSIS — I10 Essential (primary) hypertension: Secondary | ICD-10-CM | POA: Diagnosis not present

## 2023-01-07 DIAGNOSIS — G8314 Monoplegia of lower limb affecting left nondominant side: Secondary | ICD-10-CM | POA: Diagnosis not present

## 2023-01-07 DIAGNOSIS — E119 Type 2 diabetes mellitus without complications: Secondary | ICD-10-CM | POA: Diagnosis not present

## 2023-01-07 DIAGNOSIS — R531 Weakness: Secondary | ICD-10-CM | POA: Diagnosis not present

## 2023-01-08 LAB — VITAMIN D 25 HYDROXY (VIT D DEFICIENCY, FRACTURES)

## 2023-01-10 ENCOUNTER — Encounter: Payer: Self-pay | Admitting: Physician Assistant

## 2023-01-10 ENCOUNTER — Ambulatory Visit (INDEPENDENT_AMBULATORY_CARE_PROVIDER_SITE_OTHER): Payer: Medicare Other | Admitting: Physician Assistant

## 2023-01-10 VITALS — BP 110/65 | HR 63 | Temp 98.1°F | Resp 16 | Ht 61.0 in | Wt 203.8 lb

## 2023-01-10 DIAGNOSIS — E611 Iron deficiency: Secondary | ICD-10-CM

## 2023-01-10 DIAGNOSIS — Z8673 Personal history of transient ischemic attack (TIA), and cerebral infarction without residual deficits: Secondary | ICD-10-CM

## 2023-01-10 DIAGNOSIS — E538 Deficiency of other specified B group vitamins: Secondary | ICD-10-CM | POA: Diagnosis not present

## 2023-01-10 DIAGNOSIS — Z9981 Dependence on supplemental oxygen: Secondary | ICD-10-CM | POA: Diagnosis not present

## 2023-01-10 DIAGNOSIS — G4700 Insomnia, unspecified: Secondary | ICD-10-CM

## 2023-01-10 DIAGNOSIS — Z09 Encounter for follow-up examination after completed treatment for conditions other than malignant neoplasm: Secondary | ICD-10-CM | POA: Diagnosis not present

## 2023-01-10 MED ORDER — TIZANIDINE HCL 4 MG PO TABS: 2.0000 mg | ORAL_TABLET | Freq: Two times a day (BID) | ORAL | 0 refills | Status: DC | PRN

## 2023-01-10 MED ORDER — TRAZODONE HCL 50 MG PO TABS: ORAL_TABLET | ORAL | 1 refills | Status: DC

## 2023-01-10 NOTE — Progress Notes (Signed)
[facility] [address] [place], [place] [address]  Internal MEDICINE  Office Visit Note  Patient Name: [name] Douglas  [id number]  [id number]  Date of Service: 01/10/2023     Chief Complaint  Patient presents with   Hospitalization Follow-up     HPI Pt is here for recent hospital follow up. -She went to ED on 01/01/23 and was admitted for one night for CVA and discharged on 01/02/23 -She underwent CT, MRI, and CTA of head and neck and was diagnosed with acute ischemic nonhemorrhagic infarct -Was switched to pradaxa instead of eliquis due to having CVA on Eliquis. Was advised to continue lipitor and continue with home health PT/OT -Will be seeing neurology, Dr. Malvin Johns. Doesn't have appt date yet -She was found to have low b12 and iron and started oral supplements and advised to recheck labs in 3-6 months as OP--will order now to have done before next visit in Oct -had a fall last month as well and went to ED and had back pain as a result. Was given tizanidine to take as needed to take 1/2 tab BID prn. Was told by ortho to start PT which may help back in addition to knee. Will give refill to only use prn -trazodone full tab is helping her to sleep well, 1/2 tab did not help and therefore increased to full tab, will send new script -coughing a lot recently, been going on for weeks, she was put on cefdinir 300mg  BID x7 days previously but a dry cough has continued. Does have runny nose and postnasal drip which may contribute. May need humidifer on home oxygen at night as this could be contributing to dry cough. Only has a Banker with [employer] and will see if this can be added -daughter will need FMLA to help with mom's appts and would like to be called to pick this up once ready.   Current Medication: Outpatient Encounter Medications as of 01/10/2023  Medication Sig   amLODipine (NORVASC) 5 MG tablet TAKE 1 TABLET BY MOUTH DAILY (Patient taking differently: Take 5 mg  by mouth daily.)   ascorbic acid (VITAMIN C) 500 MG tablet Take 1 tablet (500 mg total) by mouth daily.   atorvastatin (LIPITOR) 80 MG tablet TAKE 1 TABLET BY MOUTH DAILY AT 6PM (Patient taking differently: Take 80 mg by mouth See admin instructions. TAKE 1 TABLET BY MOUTH DAILY AT 6PM)   benzonatate (TESSALON) 100 MG capsule Take 1 capsule (100 mg total) by mouth 2 (two) times daily as needed for cough.   cyanocobalamin 1000 MCG tablet Take 1 tablet (1,000 mcg total) by mouth daily.   dabigatran (PRADAXA) 150 MG CAPS capsule Take 1 capsule (150 mg total) by mouth every 12 (twelve) hours.   donepezil (ARICEPT) 10 MG tablet Take 1 tablet (10 mg total) by mouth at bedtime.   flecainide (TAMBOCOR) 50 MG tablet Take 1 tablet (50 mg total) by mouth 2 (two) times daily.   furosemide (LASIX) 20 MG tablet Take 1 tablet (20 mg total) by mouth every other day.   iron polysaccharides (NIFEREX) 150 MG capsule Take 1 capsule (150 mg total) by mouth daily.   levothyroxine (SYNTHROID) 88 MCG tablet TAKE 1 TABLET BY MOUTH DAILY   metoprolol succinate (TOPROL-XL) 50 MG 24 hr tablet TAKE 1 TABLETS BY MOUTH DAILY. TAKE WITH OR IMMEDIATELY FOLLOWING A MEAL   oxyCODONE (ROXICODONE) 5 MG immediate release tablet Take 0.5 tablets (2.5 mg total) by mouth every 6 (six)  hours as needed for severe pain.   venlafaxine XR (EFFEXOR-XR) 75 MG 24 hr capsule TAKE 2 CAPSULES BY MOUTH DAILY (Patient taking differently: Take 150 mg by mouth daily with breakfast.)   Vitamin D, Ergocalciferol, (DRISDOL) 1.25 MG (50000 UNIT) CAPS capsule Take 1 capsule by mouth weekly   [DISCONTINUED] JARDIANCE 10 MG TABS tablet TAKE 1 TABLET BY MOUTH DAILY BEFORE BREAKFAST   [DISCONTINUED] tiZANidine (ZANAFLEX) 4 MG tablet Take 0.5 tablets (2 mg total) by mouth 2 (two) times daily as needed for muscle spasms.   [DISCONTINUED] traZODone (DESYREL) 50 MG tablet Take 1/2 tablet by mouth before bed. (Patient taking differently: Take 25 mg by mouth at  bedtime.)   tiZANidine (ZANAFLEX) 4 MG tablet Take 0.5 tablets (2 mg total) by mouth 2 (two) times daily as needed for muscle spasms.   traZODone (DESYREL) 50 MG tablet Take 1 tablet by mouth before bed.   No facility-administered encounter medications on file as of 01/10/2023.    Surgical History: Past Surgical History:  Procedure Laterality Date   CARDIAC CATHETERIZATION  ~ 2007   EXCISIONAL HEMORRHOIDECTOMY  1960's   EYE SURGERY Bilateral    Cataract extraction with IOL   KNEE ARTHROSCOPY Left X 2 <2008   KNEE ARTHROSCOPY WITH SUBCHONDROPLASTY Right 04/19/2016   Procedure: KNEE ARTHROSCOPY WITH SUBCHONDROPLASTY, PARTIAL MENISCECTOMY;  Surgeon: Kennedy Bucker, MD;  Location: [facility] ORS;  Service: Orthopedics;  Laterality: Right;   LOOP RECORDER INSERTION N/A 01/02/2018   Procedure: LOOP RECORDER INSERTION;  Surgeon: Duke Salvia, MD;  Location: [facility] INVASIVE CV LAB;  Service: Cardiovascular;  Laterality: N/A;   LUMBAR LAMINECTOMY/DECOMPRESSION MICRODISCECTOMY N/A 02/06/2021   Procedure: Lumbar Three-Four, Lumbar Four-Five Laminectomy and Foraminotomy;  Surgeon: Barnett Abu, MD;  Location: [facility] OR;  Service: Neurosurgery;  Laterality: N/A;   SHOULDER OPEN ROTATOR CUFF REPAIR Right 2001   TONSILLECTOMY  ~ 1950   TOTAL KNEE ARTHROPLASTY Left 2008   TOTAL KNEE ARTHROPLASTY Right 07/03/2016   Procedure: TOTAL KNEE ARTHROPLASTY;  Surgeon: Kennedy Bucker, MD;  Location: [facility] ORS;  Service: Orthopedics;  Laterality: Right;   TUBAL LIGATION  1973   VAGINAL HYSTERECTOMY  1974    Medical History: Past Medical History:  Diagnosis Date   Anxiety    Arthritis    "shoulders" (08/20/2014)   Asthma    Chronic lower back pain    Complication of anesthesia    "agitated & restless after knee replacement in 2008"   COPD (chronic obstructive pulmonary disease) (HCC)    DDD (degenerative disc disease), lumbar    Depression    Diabetes mellitus without complication (HCC)    Diverticulosis    Family  history of adverse reaction to anesthesia    GERD (gastroesophageal reflux disease)    High cholesterol    Hypertension    Hypothyroidism    Memory loss    mild   On home oxygen therapy    "2L at night" (08/20/2014)   PAF (paroxysmal atrial fibrillation) (HCC)    Peripheral vascular disease (HCC)    Pre-diabetes    Shortness of breath dyspnea    with exertion   Stroke (HCC)    mild, no deficits    Family History: Family History  Problem Relation Age of Onset   Diabetes Mother    Hypertension Mother    Lung cancer Mother    Congestive Heart Failure Father    Congestive Heart Failure Sister     Social History   Socioeconomic History   Marital status:  Divorced    Spouse name: Not on file   Number of children: Not on file   Years of education: Not on file   Highest education level: Not on file  Occupational History   Not on file  Tobacco Use   Smoking status: Every Day    Packs/day: 0.25    Years: 53.00    Additional pack years: 0.00    Total pack years: 13.25    Types: Cigarettes   Smokeless tobacco: Never   Tobacco comments:    2 cigarettes per day  Vaping Use   Vaping Use: Never used  Substance and Sexual Activity   Alcohol use: No   Drug use: No   Sexual activity: Not Currently  Other Topics Concern   Not on file  Social History Narrative   Not on file   Social Determinants of Health   Financial Resource Strain: Not on file  Food Insecurity: No Food Insecurity (01/02/2023)   Hunger Vital Sign    Worried About Running Out of Food in the Last Year: Never true    Ran Out of Food in the Last Year: Never true  Transportation Needs: No Transportation Needs (01/02/2023)   PRAPARE - Administrator, Civil Service (Medical): No    Lack of Transportation (Non-Medical): No  Physical Activity: Not on file  Stress: Not on file  Social Connections: Not on file  Intimate Partner Violence: Not At Risk (01/02/2023)   Humiliation, Afraid, Rape, and Kick  questionnaire    Fear of Current or Ex-Partner: No    Emotionally Abused: No    Physically Abused: No    Sexually Abused: No      Review of Systems  Constitutional:  Negative for chills, fatigue and unexpected weight change.  HENT:  Positive for postnasal drip. Negative for congestion, rhinorrhea, sneezing and sore throat.   Eyes:  Negative for redness.  Respiratory:  Positive for cough. Negative for chest tightness and shortness of breath.   Cardiovascular:  Negative for chest pain and palpitations.  Gastrointestinal:  Negative for abdominal pain, constipation, diarrhea, nausea and vomiting.  Genitourinary:  Negative for dysuria and frequency.  Musculoskeletal:  Positive for arthralgias and back pain. Negative for joint swelling and neck pain.  Skin:  Negative for rash.  Neurological: Negative.  Negative for tremors and numbness.  Hematological:  Negative for adenopathy. Does not bruise/bleed easily.  Psychiatric/Behavioral:  Positive for sleep disturbance. Negative for behavioral problems (Depression) and suicidal ideas. The patient is not nervous/anxious.     Vital Signs: BP 110/65   Pulse 63   Temp 98.1 F (36.7 C)   Resp 16   Ht 5\' 1"  (1.549 m)   Wt 203 lb 12.8 oz (92.4 kg)   SpO2 95%   BMI 38.51 kg/m    Physical Exam Vitals and nursing note reviewed.  Constitutional:      General: She is not in acute distress.    Appearance: She is well-developed. She is obese. She is not diaphoretic.  HENT:     Head: Normocephalic and atraumatic.     Mouth/Throat:     Pharynx: No oropharyngeal exudate.  Eyes:     Pupils: Pupils are equal, round, and reactive to light.  Neck:     Thyroid: No thyromegaly.     Vascular: No JVD.     Trachea: No tracheal deviation.  Cardiovascular:     Rate and Rhythm: Normal rate and regular rhythm.     Heart sounds:  Normal heart sounds. No murmur heard.    No friction rub. No gallop.  Pulmonary:     Effort: Pulmonary effort is normal.      Breath sounds: No wheezing or rhonchi.  Abdominal:     General: Bowel sounds are normal.     Palpations: Abdomen is soft.  Musculoskeletal:        General: Normal range of motion.     Cervical back: Normal range of motion and neck supple.  Lymphadenopathy:     Cervical: No cervical adenopathy.  Skin:    General: Skin is warm and dry.  Neurological:     Mental Status: She is alert and oriented to person, place, and time.     Cranial Nerves: No cranial nerve deficit.     Motor: Weakness present.     Gait: Gait abnormal.  Psychiatric:        Behavior: Behavior normal.        Thought Content: Thought content normal.        Judgment: Judgment normal.       Assessment/Plan: 1. Encounter for examination following treatment at hospital Reviewed hospital course and medication changes  2. History of CVA (cerebrovascular accident) Will follow up with neurology  3. Insomnia, unspecified type May continue trazodone nightly  - traZODone (DESYREL) 50 MG tablet; Take 1 tablet by mouth before bed.  Dispense: 90 tablet; Refill: 1  4. B12 deficiency - B12 and Folate Panel  5. Iron deficiency - Fe+TIBC+Fer  6. Dependence on supplemental oxygen Continue oxygen as before, will see if humidifier can be added to oxygen   General Counseling: Letitia Neri understanding of the findings of todays visit and agrees with plan of treatment. I have discussed any further diagnostic evaluation that may be needed or ordered today. We also reviewed her medications today. she has been encouraged to call the office with any questions or concerns that should arise related to todays visit.    Counseling:    Orders Placed This Encounter  Procedures   B12 and Folate Panel   Fe+TIBC+Fer    This patient was seen by Lynn Ito, PA-C in collaboration with Dr. Beverely Risen as a part of collaborative care agreement.   I have reviewed all medical records from hospital follow up including  radiology reports and consults from other physicians. Appropriate follow up diagnostics will be scheduled as needed. Patient/ Family understands the plan of treatment. Time spent 40 minutes.   Dr Lyndon Code, MD Internal Medicine

## 2023-01-11 ENCOUNTER — Telehealth: Payer: Self-pay

## 2023-01-11 ENCOUNTER — Telehealth: Payer: Self-pay | Admitting: Physician Assistant

## 2023-01-11 DIAGNOSIS — Z9181 History of falling: Secondary | ICD-10-CM | POA: Diagnosis not present

## 2023-01-11 DIAGNOSIS — M25562 Pain in left knee: Secondary | ICD-10-CM | POA: Diagnosis not present

## 2023-01-11 DIAGNOSIS — Z7984 Long term (current) use of oral hypoglycemic drugs: Secondary | ICD-10-CM | POA: Diagnosis not present

## 2023-01-11 DIAGNOSIS — Z8673 Personal history of transient ischemic attack (TIA), and cerebral infarction without residual deficits: Secondary | ICD-10-CM | POA: Diagnosis not present

## 2023-01-11 DIAGNOSIS — M189 Osteoarthritis of first carpometacarpal joint, unspecified: Secondary | ICD-10-CM | POA: Diagnosis not present

## 2023-01-11 DIAGNOSIS — E119 Type 2 diabetes mellitus without complications: Secondary | ICD-10-CM | POA: Diagnosis not present

## 2023-01-11 DIAGNOSIS — I10 Essential (primary) hypertension: Secondary | ICD-10-CM | POA: Diagnosis not present

## 2023-01-11 DIAGNOSIS — Z7901 Long term (current) use of anticoagulants: Secondary | ICD-10-CM | POA: Diagnosis not present

## 2023-01-11 DIAGNOSIS — R531 Weakness: Secondary | ICD-10-CM | POA: Diagnosis not present

## 2023-01-11 DIAGNOSIS — G8314 Monoplegia of lower limb affecting left nondominant side: Secondary | ICD-10-CM | POA: Diagnosis not present

## 2023-01-11 DIAGNOSIS — F419 Anxiety disorder, unspecified: Secondary | ICD-10-CM | POA: Diagnosis not present

## 2023-01-11 DIAGNOSIS — E785 Hyperlipidemia, unspecified: Secondary | ICD-10-CM | POA: Diagnosis not present

## 2023-01-11 NOTE — Telephone Encounter (Signed)
Spoke with kelly from [place] [facility] they will add humidifier on oxygen concentrator they will  Call pt next Tuesday and deliver

## 2023-01-11 NOTE — Telephone Encounter (Signed)
Notified daughter, Carollee Herter, that FMLA paperwork is ready and can be p/u at front desk-Toni

## 2023-01-15 DIAGNOSIS — G8314 Monoplegia of lower limb affecting left nondominant side: Secondary | ICD-10-CM | POA: Diagnosis not present

## 2023-01-15 DIAGNOSIS — R531 Weakness: Secondary | ICD-10-CM | POA: Diagnosis not present

## 2023-01-15 DIAGNOSIS — E119 Type 2 diabetes mellitus without complications: Secondary | ICD-10-CM | POA: Diagnosis not present

## 2023-01-15 DIAGNOSIS — M189 Osteoarthritis of first carpometacarpal joint, unspecified: Secondary | ICD-10-CM | POA: Diagnosis not present

## 2023-01-15 DIAGNOSIS — I10 Essential (primary) hypertension: Secondary | ICD-10-CM | POA: Diagnosis not present

## 2023-01-15 DIAGNOSIS — M25562 Pain in left knee: Secondary | ICD-10-CM | POA: Diagnosis not present

## 2023-01-18 ENCOUNTER — Emergency Department: Payer: Medicare Other

## 2023-01-18 ENCOUNTER — Telehealth: Payer: Self-pay

## 2023-01-18 ENCOUNTER — Other Ambulatory Visit: Payer: Self-pay

## 2023-01-18 ENCOUNTER — Telehealth: Payer: Self-pay | Admitting: Internal Medicine

## 2023-01-18 ENCOUNTER — Emergency Department
Admission: EM | Admit: 2023-01-18 | Discharge: 2023-01-18 | Disposition: A | Discharge status: Home / Self Care | Payer: Medicare Other | Attending: Emergency Medicine | Admitting: Emergency Medicine

## 2023-01-18 DIAGNOSIS — E119 Type 2 diabetes mellitus without complications: Secondary | ICD-10-CM | POA: Insufficient documentation

## 2023-01-18 DIAGNOSIS — I6782 Cerebral ischemia: Secondary | ICD-10-CM | POA: Diagnosis not present

## 2023-01-18 DIAGNOSIS — R55 Syncope and collapse: Secondary | ICD-10-CM | POA: Diagnosis not present

## 2023-01-18 DIAGNOSIS — J449 Chronic obstructive pulmonary disease, unspecified: Secondary | ICD-10-CM | POA: Diagnosis not present

## 2023-01-18 DIAGNOSIS — I10 Essential (primary) hypertension: Secondary | ICD-10-CM | POA: Insufficient documentation

## 2023-01-18 DIAGNOSIS — I6789 Other cerebrovascular disease: Secondary | ICD-10-CM | POA: Diagnosis not present

## 2023-01-18 DIAGNOSIS — R42 Dizziness and giddiness: Secondary | ICD-10-CM | POA: Diagnosis not present

## 2023-01-18 DIAGNOSIS — R202 Paresthesia of skin: Secondary | ICD-10-CM | POA: Insufficient documentation

## 2023-01-18 DIAGNOSIS — R0602 Shortness of breath: Secondary | ICD-10-CM | POA: Diagnosis not present

## 2023-01-18 DIAGNOSIS — H538 Other visual disturbances: Secondary | ICD-10-CM | POA: Diagnosis not present

## 2023-01-18 DIAGNOSIS — R29818 Other symptoms and signs involving the nervous system: Secondary | ICD-10-CM | POA: Diagnosis not present

## 2023-01-18 DIAGNOSIS — R531 Weakness: Secondary | ICD-10-CM | POA: Diagnosis not present

## 2023-01-18 DIAGNOSIS — J439 Emphysema, unspecified: Secondary | ICD-10-CM | POA: Diagnosis not present

## 2023-01-18 DIAGNOSIS — I959 Hypotension, unspecified: Secondary | ICD-10-CM | POA: Diagnosis not present

## 2023-01-18 DIAGNOSIS — R200 Anesthesia of skin: Secondary | ICD-10-CM | POA: Diagnosis not present

## 2023-01-18 LAB — CBC
HCT: 30.9 % — ABNORMAL LOW (ref 36.0–46.0)
Hemoglobin: 9.3 g/dL — ABNORMAL LOW (ref 12.0–15.0)
MCH: 26.1 pg (ref 26.0–34.0)
MCHC: 30.1 g/dL (ref 30.0–36.0)
MCV: 86.6 fL (ref 80.0–100.0)
Platelets: 262 10*3/uL (ref 150–400)
RBC: 3.57 MIL/uL — ABNORMAL LOW (ref 3.87–5.11)
RDW: 17.6 % — ABNORMAL HIGH (ref 11.5–15.5)
WBC: 11.5 10*3/uL — ABNORMAL HIGH (ref 4.0–10.5)
nRBC: 0 % (ref 0.0–0.2)

## 2023-01-18 LAB — BASIC METABOLIC PANEL
Anion gap: 10 (ref 5–15)
BUN: 12 mg/dL (ref 8–23)
CO2: 25 mmol/L (ref 22–32)
Calcium: 9 mg/dL (ref 8.9–10.3)
Chloride: 102 mmol/L (ref 98–111)
Creatinine, Ser: 0.97 mg/dL (ref 0.44–1.00)
GFR, Estimated: 58 mL/min — ABNORMAL LOW (ref >60)
Glucose, Bld: 112 mg/dL — ABNORMAL HIGH (ref 70–99)
Potassium: 4 mmol/L (ref 3.5–5.1)
Sodium: 137 mmol/L (ref 135–145)

## 2023-01-18 LAB — TROPONIN I (HIGH SENSITIVITY)
Troponin I (High Sensitivity): 8 ng/L (ref <18)
Troponin I (High Sensitivity): 9 ng/L (ref <18)

## 2023-01-18 NOTE — ED Provider Notes (Signed)
[facility] Provider Note    Event Date/Time   First MD Initiated Contact with Patient 01/18/23 [phone]     (approximate)   History   Chief Complaint: Near Syncope   HPI  [name] Douglas is a 82 y.o. female with a history of COPD, GERD, hypertension, paroxysmal atrial fibrillation, diabetes, peripheral vascular disease who comes ED complaining of dizziness and blurry vision that started at 11:30 PM tonight.  Reports feeling "numb" all over, bilaterally head to toe.  No motor weakness.  No headache, neck pain, fever, trauma.  Reports that while using her walker she felt like she was cannot fall back due to loss of balance.  Reviewed outside records noting that patient was recently hospitalized about 2 weeks ago due to acute stroke.  During that hospitalization she was changed from Eliquis to Pradaxa anticoagulation.     Physical Exam   Triage Vital Signs: ED Triage Vitals  Enc Vitals Group     BP 01/18/23 0107 (!) 115/33     Pulse Rate 01/18/23 0107 60     Resp 01/18/23 0107 20     Temp 01/18/23 0107 98.3 F (36.8 C)     Temp Source 01/18/23 0107 Oral     SpO2 01/18/23 0107 99 %     Weight 01/18/23 0108 201 lb (91.2 kg)     Height 01/18/23 0108 5\' 4"  (1.626 m)     Head Circumference --      Peak Flow --      Pain Score 01/18/23 0117 0     Pain Loc --      Pain Edu? --      Excl. in GC? --     Most recent vital signs: Vitals:   01/18/23 0324 01/18/23 0510  BP: (!) 153/42 (!) 148/48  Pulse: 61 64  Resp: 18 (!) 22  Temp: (!) 97.5 F (36.4 C)   SpO2: 100% 100%    General: Awake, no distress.  CV:  Good peripheral perfusion.  Resp:  Normal effort.  Abd:  No distention.  Other:  Cranial nerves III through XII intact.  No motor drift.  Normal cerebellar function.  Normal language   ED Results / Procedures / Treatments   Labs (all labs ordered are listed, but only abnormal results are displayed) Labs Reviewed  BASIC METABOLIC PANEL -  Abnormal; Notable for the following components:      Result Value   Glucose, Bld 112 (*)    GFR, Estimated 58 (*)    All other components within normal limits  CBC - Abnormal; Notable for the following components:   WBC 11.5 (*)    RBC 3.57 (*)    Hemoglobin 9.3 (*)    HCT 30.9 (*)    RDW 17.6 (*)    All other components within normal limits  URINALYSIS, ROUTINE W REFLEX MICROSCOPIC  TROPONIN I (HIGH SENSITIVITY)  TROPONIN I (HIGH SENSITIVITY)     EKG Interpreted by me Normal sinus rhythm rate of 61.  Normal axis, normal intervals.  Normal QRS ST segments and T waves   RADIOLOGY Chest x-ray interpreted by me, appears unremarkable.  Radiology report reviewed.  CT head unremarkable, shows prior remote infarcts.   PROCEDURES:  Procedures   MEDICATIONS ORDERED IN ED: Medications - No data to display   IMPRESSION / MDM / ASSESSMENT AND PLAN / ED COURSE  I reviewed the triage vital signs and the nursing notes.  DDx: Acute ischemic stroke, intracranial  hemorrhage, anemia, dehydration, electrolyte abnormality  Patient's presentation is most consistent with acute presentation with potential threat to life or bodily function.  Patient presents with dizziness, being worse unsteady on her feet with ambulation.  No fall or trauma.  No evidence of infection.  Doubt ACS PE or dissection.  Labs and CT head are unremarkable.  Patient reports feeling better here in the ED.  Will obtain MRI.   ----------------------------------------- 6:51 AM on 01/18/2023 ----------------------------------------- MRI/MRA negative for acute findings.  Patient remains at her baseline.  Stable for discharge home.  Patient is agreeable with outpatient follow-up.      FINAL CLINICAL IMPRESSION(S) / ED DIAGNOSES   Final diagnoses:  Dizziness     Rx / DC Orders   ED Discharge Orders     None        Note:  This document was prepared using Dragon voice recognition software and may  include unintentional dictation errors.   Sharman Cheek, MD 01/18/23 [phone]

## 2023-01-18 NOTE — Discharge Instructions (Signed)
Your lab tests, CT scan of the head, and MRI of the brain do not show any new issues.  Continue taking all of your medications as usual and follow-up with your doctor.

## 2023-01-18 NOTE — ED Notes (Signed)
Oxygen tank changed out at this time. 

## 2023-01-18 NOTE — ED Triage Notes (Signed)
Pt reports near syncope at home. Reports acute onset of dizziness, blurred vision and diffuse tingling. States now resolved but feels weak. Pt currently alert and oriented following commands. Breathing unlabored speaking in full sentences. Pt wears 4L [place] at baseline. Pt denies chest pain or SOB at time of episode or currently.

## 2023-01-18 NOTE — Telephone Encounter (Signed)
Patient daughter called asking if we really needed to see her mom since Leotis Shames just seen her here in office, and with her just going to the ED, spoke with Leotis Shames and she said she doesn't need to see her right away cause she isn't going to change anything. Daughter will monitor her and let us know if anything else happens.

## 2023-01-18 NOTE — ED Triage Notes (Signed)
FIRST NURSE NOTE:  pt arrived via [facility] from [facility] independent living, pt c/o numbness all over,   CBG 129  136/48 P67 100% 4L [place] wears at home  A/O x 4.

## 2023-01-18 NOTE — Telephone Encounter (Signed)
Lvm with daughter to schedule ED follow up-Toni

## 2023-01-22 DIAGNOSIS — M25562 Pain in left knee: Secondary | ICD-10-CM | POA: Diagnosis not present

## 2023-01-22 DIAGNOSIS — E119 Type 2 diabetes mellitus without complications: Secondary | ICD-10-CM | POA: Diagnosis not present

## 2023-01-22 DIAGNOSIS — G8314 Monoplegia of lower limb affecting left nondominant side: Secondary | ICD-10-CM | POA: Diagnosis not present

## 2023-01-22 DIAGNOSIS — I10 Essential (primary) hypertension: Secondary | ICD-10-CM | POA: Diagnosis not present

## 2023-01-22 DIAGNOSIS — R531 Weakness: Secondary | ICD-10-CM | POA: Diagnosis not present

## 2023-01-22 DIAGNOSIS — M189 Osteoarthritis of first carpometacarpal joint, unspecified: Secondary | ICD-10-CM | POA: Diagnosis not present

## 2023-01-23 ENCOUNTER — Telehealth: Payer: Self-pay

## 2023-01-23 NOTE — Telephone Encounter (Signed)
Transition Care Management Unsuccessful Follow-up Telephone Call  Date of discharge and from where:  01/18/2023 [facility]  Attempts:  2nd Attempt  Reason for unsuccessful TCM follow-up call:  Left voice message  Emmaleigh Longo [facility]  [facility] Population Health Community Resource Care Guide   [email or web address]  ?? [phone]   Website: [email or web address]  [email or web address]

## 2023-01-23 NOTE — Telephone Encounter (Signed)
Transition Care Management Unsuccessful Follow-up Telephone Call  Date of discharge and from where:  01/18/2023 [facility]  Attempts:  1st Attempt  Reason for unsuccessful TCM follow-up call:  No answer/busy  Lanette Ell [facility]  [facility] Population Health Community Resource Care Guide   [email or web address]  ?? [phone]   Website: [email or web address]  [email or web address]

## 2023-01-28 DIAGNOSIS — E119 Type 2 diabetes mellitus without complications: Secondary | ICD-10-CM | POA: Diagnosis not present

## 2023-01-28 DIAGNOSIS — M189 Osteoarthritis of first carpometacarpal joint, unspecified: Secondary | ICD-10-CM | POA: Diagnosis not present

## 2023-01-28 DIAGNOSIS — J40 Bronchitis, not specified as acute or chronic: Secondary | ICD-10-CM | POA: Diagnosis not present

## 2023-01-28 DIAGNOSIS — R531 Weakness: Secondary | ICD-10-CM | POA: Diagnosis not present

## 2023-01-28 DIAGNOSIS — I10 Essential (primary) hypertension: Secondary | ICD-10-CM | POA: Diagnosis not present

## 2023-01-28 DIAGNOSIS — R0989 Other specified symptoms and signs involving the circulatory and respiratory systems: Secondary | ICD-10-CM | POA: Diagnosis not present

## 2023-01-28 DIAGNOSIS — R052 Subacute cough: Secondary | ICD-10-CM | POA: Diagnosis not present

## 2023-01-28 DIAGNOSIS — R911 Solitary pulmonary nodule: Secondary | ICD-10-CM | POA: Diagnosis not present

## 2023-01-28 DIAGNOSIS — M25562 Pain in left knee: Secondary | ICD-10-CM | POA: Diagnosis not present

## 2023-01-28 DIAGNOSIS — G8314 Monoplegia of lower limb affecting left nondominant side: Secondary | ICD-10-CM | POA: Diagnosis not present

## 2023-01-28 DIAGNOSIS — Z8701 Personal history of pneumonia (recurrent): Secondary | ICD-10-CM | POA: Diagnosis not present

## 2023-01-28 DIAGNOSIS — R059 Cough, unspecified: Secondary | ICD-10-CM | POA: Diagnosis not present

## 2023-01-29 ENCOUNTER — Telehealth: Payer: Medicare Other | Admitting: Nurse Practitioner

## 2023-02-05 DIAGNOSIS — R531 Weakness: Secondary | ICD-10-CM | POA: Diagnosis not present

## 2023-02-05 DIAGNOSIS — G8314 Monoplegia of lower limb affecting left nondominant side: Secondary | ICD-10-CM | POA: Diagnosis not present

## 2023-02-05 DIAGNOSIS — M189 Osteoarthritis of first carpometacarpal joint, unspecified: Secondary | ICD-10-CM | POA: Diagnosis not present

## 2023-02-05 DIAGNOSIS — E119 Type 2 diabetes mellitus without complications: Secondary | ICD-10-CM | POA: Diagnosis not present

## 2023-02-05 DIAGNOSIS — I10 Essential (primary) hypertension: Secondary | ICD-10-CM | POA: Diagnosis not present

## 2023-02-05 DIAGNOSIS — M25562 Pain in left knee: Secondary | ICD-10-CM | POA: Diagnosis not present

## 2023-02-13 ENCOUNTER — Other Ambulatory Visit: Payer: Self-pay | Admitting: Physician Assistant

## 2023-02-13 DIAGNOSIS — F411 Generalized anxiety disorder: Secondary | ICD-10-CM

## 2023-02-20 DIAGNOSIS — R0902 Hypoxemia: Secondary | ICD-10-CM | POA: Diagnosis not present

## 2023-02-20 DIAGNOSIS — I5189 Other ill-defined heart diseases: Secondary | ICD-10-CM | POA: Diagnosis not present

## 2023-02-20 DIAGNOSIS — F17218 Nicotine dependence, cigarettes, with other nicotine-induced disorders: Secondary | ICD-10-CM | POA: Diagnosis not present

## 2023-02-20 DIAGNOSIS — R911 Solitary pulmonary nodule: Secondary | ICD-10-CM | POA: Diagnosis not present

## 2023-02-20 DIAGNOSIS — R0602 Shortness of breath: Secondary | ICD-10-CM | POA: Diagnosis not present

## 2023-02-20 DIAGNOSIS — J8410 Pulmonary fibrosis, unspecified: Secondary | ICD-10-CM | POA: Diagnosis not present

## 2023-02-20 DIAGNOSIS — I272 Pulmonary hypertension, unspecified: Secondary | ICD-10-CM | POA: Diagnosis not present

## 2023-02-28 ENCOUNTER — Other Ambulatory Visit: Payer: Self-pay | Admitting: Internal Medicine

## 2023-02-28 DIAGNOSIS — I480 Paroxysmal atrial fibrillation: Secondary | ICD-10-CM

## 2023-02-28 DIAGNOSIS — Z79899 Other long term (current) drug therapy: Secondary | ICD-10-CM

## 2023-02-28 DIAGNOSIS — Z9889 Other specified postprocedural states: Secondary | ICD-10-CM

## 2023-02-28 DIAGNOSIS — I639 Cerebral infarction, unspecified: Secondary | ICD-10-CM

## 2023-02-28 DIAGNOSIS — R0600 Dyspnea, unspecified: Secondary | ICD-10-CM

## 2023-02-28 DIAGNOSIS — I10 Essential (primary) hypertension: Secondary | ICD-10-CM

## 2023-03-04 ENCOUNTER — Other Ambulatory Visit: Payer: Self-pay | Admitting: Specialist

## 2023-03-04 DIAGNOSIS — R911 Solitary pulmonary nodule: Secondary | ICD-10-CM

## 2023-03-04 DIAGNOSIS — R9389 Abnormal findings on diagnostic imaging of other specified body structures: Secondary | ICD-10-CM

## 2023-03-07 ENCOUNTER — Other Ambulatory Visit: Payer: Self-pay | Admitting: Physician Assistant

## 2023-03-07 DIAGNOSIS — I10 Essential (primary) hypertension: Secondary | ICD-10-CM

## 2023-03-11 ENCOUNTER — Ambulatory Visit
Admission: RE | Admit: 2023-03-11 | Discharge: 2023-03-11 | Disposition: A | Discharge status: Home / Self Care | Payer: Medicare Other | Source: Ambulatory Visit | Attending: Specialist | Admitting: Specialist

## 2023-03-11 DIAGNOSIS — R911 Solitary pulmonary nodule: Secondary | ICD-10-CM | POA: Diagnosis not present

## 2023-03-11 DIAGNOSIS — R918 Other nonspecific abnormal finding of lung field: Secondary | ICD-10-CM | POA: Diagnosis not present

## 2023-03-11 DIAGNOSIS — R9389 Abnormal findings on diagnostic imaging of other specified body structures: Secondary | ICD-10-CM | POA: Insufficient documentation

## 2023-03-11 DIAGNOSIS — R590 Localized enlarged lymph nodes: Secondary | ICD-10-CM | POA: Diagnosis not present

## 2023-03-12 DIAGNOSIS — H353131 Nonexudative age-related macular degeneration, bilateral, early dry stage: Secondary | ICD-10-CM | POA: Diagnosis not present

## 2023-03-12 DIAGNOSIS — Z961 Presence of intraocular lens: Secondary | ICD-10-CM | POA: Diagnosis not present

## 2023-03-12 DIAGNOSIS — H26492 Other secondary cataract, left eye: Secondary | ICD-10-CM | POA: Diagnosis not present

## 2023-03-12 DIAGNOSIS — Z9889 Other specified postprocedural states: Secondary | ICD-10-CM | POA: Diagnosis not present

## 2023-03-20 ENCOUNTER — Other Ambulatory Visit: Payer: Self-pay | Admitting: Specialist

## 2023-03-20 DIAGNOSIS — R911 Solitary pulmonary nodule: Secondary | ICD-10-CM

## 2023-03-21 DIAGNOSIS — R519 Headache, unspecified: Secondary | ICD-10-CM | POA: Diagnosis not present

## 2023-03-21 DIAGNOSIS — R440 Auditory hallucinations: Secondary | ICD-10-CM | POA: Diagnosis not present

## 2023-03-21 DIAGNOSIS — J8410 Pulmonary fibrosis, unspecified: Secondary | ICD-10-CM | POA: Diagnosis not present

## 2023-03-21 DIAGNOSIS — R918 Other nonspecific abnormal finding of lung field: Secondary | ICD-10-CM | POA: Diagnosis not present

## 2023-03-21 DIAGNOSIS — G459 Transient cerebral ischemic attack, unspecified: Secondary | ICD-10-CM | POA: Diagnosis not present

## 2023-03-21 DIAGNOSIS — R413 Other amnesia: Secondary | ICD-10-CM | POA: Diagnosis not present

## 2023-03-21 DIAGNOSIS — M545 Low back pain, unspecified: Secondary | ICD-10-CM | POA: Diagnosis not present

## 2023-03-21 DIAGNOSIS — R296 Repeated falls: Secondary | ICD-10-CM | POA: Diagnosis not present

## 2023-03-21 DIAGNOSIS — R441 Visual hallucinations: Secondary | ICD-10-CM | POA: Diagnosis not present

## 2023-03-21 DIAGNOSIS — Z9981 Dependence on supplemental oxygen: Secondary | ICD-10-CM | POA: Diagnosis not present

## 2023-03-29 ENCOUNTER — Ambulatory Visit: Admission: RE | Admit: 2023-03-29 | Payer: Medicare Other | Source: Ambulatory Visit

## 2023-04-01 ENCOUNTER — Ambulatory Visit
Admission: RE | Admit: 2023-04-01 | Discharge: 2023-04-01 | Disposition: A | Discharge status: Home / Self Care | Payer: Medicare Other | Source: Ambulatory Visit | Attending: Specialist | Admitting: Specialist

## 2023-04-01 DIAGNOSIS — R911 Solitary pulmonary nodule: Secondary | ICD-10-CM | POA: Insufficient documentation

## 2023-04-01 LAB — GLUCOSE, CAPILLARY: Glucose-Capillary: 85 mg/dL (ref 70–99)

## 2023-04-01 MED ORDER — FLUDEOXYGLUCOSE F - 18 (FDG) INJECTION
10.3200 | Freq: Once | INTRAVENOUS | Status: AC | PRN
  Administered 2023-04-01: 10.32 via INTRAVENOUS

## 2023-04-08 ENCOUNTER — Telehealth: Payer: Self-pay | Admitting: *Deleted

## 2023-04-08 NOTE — Telephone Encounter (Signed)
Spoke Carollee Herter (daughter) regarding urgent referral for this patient .  Appointment is on Wed 04/10/23 @ 130.  Nursing desk phone number given to the daughter in the case that the appointment needs to be changed.

## 2023-04-09 ENCOUNTER — Encounter: Payer: Medicare Other | Admitting: Cardiology

## 2023-04-10 ENCOUNTER — Encounter: Payer: Self-pay | Admitting: Radiation Oncology

## 2023-04-10 ENCOUNTER — Ambulatory Visit
Admission: RE | Admit: 2023-04-10 | Discharge: 2023-04-10 | Disposition: A | Discharge status: Home / Self Care | Payer: Medicare Other | Source: Ambulatory Visit | Attending: Radiation Oncology | Admitting: Radiation Oncology

## 2023-04-10 VITALS — BP 146/46 | HR 91 | Temp 97.1°F | Resp 18 | Ht 64.0 in | Wt 195.1 lb

## 2023-04-10 DIAGNOSIS — R911 Solitary pulmonary nodule: Secondary | ICD-10-CM | POA: Insufficient documentation

## 2023-04-10 DIAGNOSIS — J449 Chronic obstructive pulmonary disease, unspecified: Secondary | ICD-10-CM | POA: Insufficient documentation

## 2023-04-10 DIAGNOSIS — Z7902 Long term (current) use of antithrombotics/antiplatelets: Secondary | ICD-10-CM | POA: Diagnosis not present

## 2023-04-10 DIAGNOSIS — C3411 Malignant neoplasm of upper lobe, right bronchus or lung: Secondary | ICD-10-CM | POA: Insufficient documentation

## 2023-04-10 DIAGNOSIS — M5136 Other intervertebral disc degeneration, lumbar region: Secondary | ICD-10-CM | POA: Insufficient documentation

## 2023-04-10 DIAGNOSIS — I10 Essential (primary) hypertension: Secondary | ICD-10-CM | POA: Insufficient documentation

## 2023-04-10 DIAGNOSIS — Z510 Encounter for antineoplastic radiation therapy: Secondary | ICD-10-CM | POA: Diagnosis not present

## 2023-04-10 DIAGNOSIS — R110 Nausea: Secondary | ICD-10-CM | POA: Insufficient documentation

## 2023-04-10 DIAGNOSIS — Z801 Family history of malignant neoplasm of trachea, bronchus and lung: Secondary | ICD-10-CM | POA: Insufficient documentation

## 2023-04-10 DIAGNOSIS — G8929 Other chronic pain: Secondary | ICD-10-CM | POA: Diagnosis not present

## 2023-04-10 DIAGNOSIS — E039 Hypothyroidism, unspecified: Secondary | ICD-10-CM | POA: Insufficient documentation

## 2023-04-10 DIAGNOSIS — Z79899 Other long term (current) drug therapy: Secondary | ICD-10-CM | POA: Diagnosis not present

## 2023-04-10 DIAGNOSIS — Z79890 Hormone replacement therapy (postmenopausal): Secondary | ICD-10-CM | POA: Insufficient documentation

## 2023-04-10 DIAGNOSIS — J4489 Other specified chronic obstructive pulmonary disease: Secondary | ICD-10-CM | POA: Diagnosis not present

## 2023-04-10 DIAGNOSIS — Z8673 Personal history of transient ischemic attack (TIA), and cerebral infarction without residual deficits: Secondary | ICD-10-CM | POA: Diagnosis not present

## 2023-04-10 DIAGNOSIS — G4700 Insomnia, unspecified: Secondary | ICD-10-CM | POA: Diagnosis not present

## 2023-04-10 DIAGNOSIS — F17210 Nicotine dependence, cigarettes, uncomplicated: Secondary | ICD-10-CM | POA: Diagnosis not present

## 2023-04-10 NOTE — Consult Note (Signed)
NEW PATIENT EVALUATION  Name: [name] Douglas  MRN: [id number]  Date:   04/10/2023     DOB: Oct 13, 1940   This 82 y.o. female patient presents to the clinic for initial evaluation of evaluation of a stage I presumed non-small cell lung cancer of the right lung and patient with multiple comorbidities precluding surgical intervention.  REFERRING PHYSICIAN: Lyndon Code, MD  CHIEF COMPLAINT:  Chief Complaint  Patient presents with   Lung Cancer    consult    DIAGNOSIS: The encounter diagnosis was Solitary pulmonary nodule.   PREVIOUS INVESTIGATIONS:  PET scan CT scans reviewed Labs reviewed Clinical notes reviewed  HPI: Patient is a 82 year old female who presented with a new lesion in her right chest showing a 1.7 x 1.1 cm peripheral right upper lobe pulmonary nodule new from February 2024.  Patient had PET CT scan showing some hypermetabolic activity significantly in the pulmonary nodule.  There is also some slight hypermetabolic activity in the right paratracheal nodes although this is favored to be benign.  Patient is multiple medical comorbidities including history of stroke COPD essential hypertension transient ischemic attacks.  She is now referred on urgent basis for consideration of SBRT.  She does have a cough and some occasional hemoptysis.  She is also having some problems with sleep as well as persistent nausea.  PLANNED TREATMENT REGIMEN: SBRT  PAST MEDICAL HISTORY:  has a past medical history of Anxiety, Arthritis, Asthma, Chronic lower back pain, Complication of anesthesia, COPD (chronic obstructive pulmonary disease) (HCC), DDD (degenerative disc disease), lumbar, Depression, Diabetes mellitus without complication (HCC), Diverticulosis, Family history of adverse reaction to anesthesia, GERD (gastroesophageal reflux disease), High cholesterol, Hypertension, Hypothyroidism, Memory loss, On home oxygen therapy, PAF (paroxysmal atrial fibrillation) (HCC), Peripheral vascular  disease (HCC), Pre-diabetes, Shortness of breath dyspnea, and Stroke (HCC).    PAST SURGICAL HISTORY:  Past Surgical History:  Procedure Laterality Date   CARDIAC CATHETERIZATION  ~ 2007   EXCISIONAL HEMORRHOIDECTOMY  1960's   EYE SURGERY Bilateral    Cataract extraction with IOL   KNEE ARTHROSCOPY Left X 2 <2008   KNEE ARTHROSCOPY WITH SUBCHONDROPLASTY Right 04/19/2016   Procedure: KNEE ARTHROSCOPY WITH SUBCHONDROPLASTY, PARTIAL MENISCECTOMY;  Surgeon: Kennedy Bucker, MD;  Location: [facility] ORS;  Service: Orthopedics;  Laterality: Right;   LOOP RECORDER INSERTION N/A 01/02/2018   Procedure: LOOP RECORDER INSERTION;  Surgeon: Duke Salvia, MD;  Location: [facility] INVASIVE CV LAB;  Service: Cardiovascular;  Laterality: N/A;   LUMBAR LAMINECTOMY/DECOMPRESSION MICRODISCECTOMY N/A 02/06/2021   Procedure: Lumbar Three-Four, Lumbar Four-Five Laminectomy and Foraminotomy;  Surgeon: Barnett Abu, MD;  Location: [facility] OR;  Service: Neurosurgery;  Laterality: N/A;   SHOULDER OPEN ROTATOR CUFF REPAIR Right 2001   TONSILLECTOMY  ~ 1950   TOTAL KNEE ARTHROPLASTY Left 2008   TOTAL KNEE ARTHROPLASTY Right 07/03/2016   Procedure: TOTAL KNEE ARTHROPLASTY;  Surgeon: Kennedy Bucker, MD;  Location: [facility] ORS;  Service: Orthopedics;  Laterality: Right;   TUBAL LIGATION  1973   VAGINAL HYSTERECTOMY  1974    FAMILY HISTORY: family history includes Congestive Heart Failure in her father and sister; Diabetes in her mother; Hypertension in her mother; Lung cancer in her mother.  SOCIAL HISTORY:  reports that she has been smoking cigarettes. She has a 13.3 pack-year smoking history. She has never used smokeless tobacco. She reports that she does not drink alcohol and does not use drugs.  ALLERGIES: Trazodone and nefazodone and Heparin  MEDICATIONS:  Current Outpatient Medications  Medication Sig Dispense  Refill   amLODipine (NORVASC) 5 MG tablet TAKE 1 TABLET BY MOUTH DAILY 90 tablet 1   ascorbic acid (VITAMIN C) 500 MG  tablet Take 1 tablet (500 mg total) by mouth daily. 90 tablet 1   atorvastatin (LIPITOR) 80 MG tablet TAKE 1 TABLET BY MOUTH DAILY AT 6PM 90 tablet 1   cyanocobalamin 1000 MCG tablet Take 1 tablet (1,000 mcg total) by mouth daily. 90 tablet 1   dabigatran (PRADAXA) 150 MG CAPS capsule Take 1 capsule (150 mg total) by mouth every 12 (twelve) hours. 180 capsule 3   donepezil (ARICEPT) 10 MG tablet TAKE ONE TABLET BY MOUTH AT BEDTIME 90 tablet 1   flecainide (TAMBOCOR) 50 MG tablet Take 1 tablet (50 mg total) by mouth 2 (two) times daily. 180 tablet 3   furosemide (LASIX) 20 MG tablet TAKE 1 TABLET BY MOUTH EVERY OTHER DAY 45 tablet 3   iron polysaccharides (NIFEREX) 150 MG capsule Take 1 capsule (150 mg total) by mouth daily. 90 capsule 1   levothyroxine (SYNTHROID) 88 MCG tablet TAKE 1 TABLET BY MOUTH DAILY 90 tablet 3   venlafaxine XR (EFFEXOR-XR) 75 MG 24 hr capsule TAKE 2 CAPSULES BY MOUTH DAILY 180 capsule 1   Vitamin D, Ergocalciferol, (DRISDOL) 1.25 MG (50000 UNIT) CAPS capsule Take 1 capsule by mouth weekly 12 capsule 3   benzonatate (TESSALON) 100 MG capsule Take 1 capsule (100 mg total) by mouth 2 (two) times daily as needed for cough. (Patient not taking: Reported on 04/10/2023) 30 capsule 0   metoprolol succinate (TOPROL-XL) 50 MG 24 hr tablet TAKE 1 TABLETS BY MOUTH DAILY. TAKE WITH OR IMMEDIATELY FOLLOWING A MEAL (Patient not taking: Reported on 04/10/2023) 90 tablet 3   oxyCODONE (ROXICODONE) 5 MG immediate release tablet Take 0.5 tablets (2.5 mg total) by mouth every 6 (six) hours as needed for severe pain. (Patient not taking: Reported on 04/10/2023) 8 tablet 0   tiZANidine (ZANAFLEX) 4 MG tablet Take 0.5 tablets (2 mg total) by mouth 2 (two) times daily as needed for muscle spasms. (Patient not taking: Reported on 04/10/2023) 10 tablet 0   traZODone (DESYREL) 50 MG tablet Take 1 tablet by mouth before bed. (Patient not taking: Reported on 04/10/2023) 90 tablet 1   No current  facility-administered medications for this encounter.    ECOG PERFORMANCE STATUS:  0 - Asymptomatic  REVIEW OF SYSTEMS: Patient denies any weight loss, fatigue, weakness, fever, chills or night sweats. Patient denies any loss of vision, blurred vision. Patient denies any ringing  of the ears or hearing loss. No irregular heartbeat. Patient denies heart murmur or history of fainting. Patient denies any chest pain or pain radiating to her upper extremities. Patient denies any shortness of breath, difficulty breathing at night, cough or hemoptysis. Patient denies any swelling in the lower legs. Patient denies any nausea vomiting, vomiting of blood, or coffee ground material in the vomitus. Patient denies any stomach pain. Patient states has had normal bowel movements no significant constipation or diarrhea. Patient denies any dysuria, hematuria or significant nocturia. Patient denies any problems walking, swelling in the joints or loss of balance. Patient denies any skin changes, loss of hair or loss of weight. Patient denies any excessive worrying or anxiety or significant depression. Patient denies any problems with insomnia. Patient denies excessive thirst, polyuria, polydipsia. Patient denies any swollen glands, patient denies easy bruising or easy bleeding. Patient denies any recent infections, allergies or URI. Patient "s visual fields have not changed significantly  in recent time.   PHYSICAL EXAM: BP (!) 146/46   Pulse 91   Temp (!) 97.1 F (36.2 C)   Resp 18   Ht 5\' 4"  (1.626 m)   Wt 195 lb 1.6 oz (88.5 kg)   BMI 33.49 kg/m  Elderly female in NAD.  Well-developed well-nourished patient in NAD. HEENT reveals PERLA, EOMI, discs not visualized.  Oral cavity is clear. No oral mucosal lesions are identified. Neck is clear without evidence of cervical or supraclavicular adenopathy. Lungs are clear to A&P. Cardiac examination is essentially unremarkable with regular rate and rhythm without murmur rub  or thrill. Abdomen is benign with no organomegaly or masses noted. Motor sensory and DTR levels are equal and symmetric in the upper and lower extremities. Cranial nerves II through XII are grossly intact. Proprioception is intact. No peripheral adenopathy or edema is identified. No motor or sensory levels are noted. Crude visual fields are within normal range.  LABORATORY DATA: Labs reviewed    RADIOLOGY RESULTS: CT scans in serial fashion as well as PET scan reviewed compatible with above-stated findings   IMPRESSION: Stage I non-small cell lung cancer of the peripheral right upper lobe in 82 year old female with multiple medical comorbidities precluding surgical intervention.  PLAN: At this time of offered the option of SBRT.  Would plan on delivering 60 Gray in 5 fractions to her peripheral right upper lobe lesion.  Risks and benefits including extremely low side effect profile for SBRT were discussed with the patient and her daughter.  She may develop a slight cough about a month out from treatment.  Also starting on Zofran for her nausea have also suggested Benadryl at night for helping her with her sleep.  I have personally set up and ordered CT simulation for next week.  Patient comprehends my recommendations well.  I would like to take this opportunity to thank you for allowing me to participate in the care of your patient.Carmina Miller, MD

## 2023-04-11 ENCOUNTER — Other Ambulatory Visit: Payer: Self-pay | Admitting: *Deleted

## 2023-04-11 MED ORDER — ONDANSETRON HCL 8 MG PO TABS: 8.0000 mg | ORAL_TABLET | Freq: Three times a day (TID) | ORAL | 0 refills | Status: DC | PRN

## 2023-04-15 DIAGNOSIS — R911 Solitary pulmonary nodule: Secondary | ICD-10-CM | POA: Diagnosis not present

## 2023-04-16 ENCOUNTER — Ambulatory Visit
Admission: RE | Admit: 2023-04-16 | Discharge: 2023-04-16 | Disposition: A | Discharge status: Home / Self Care | Payer: Medicare Other | Source: Ambulatory Visit | Attending: Radiation Oncology | Admitting: Radiation Oncology

## 2023-04-16 DIAGNOSIS — G4700 Insomnia, unspecified: Secondary | ICD-10-CM | POA: Diagnosis not present

## 2023-04-16 DIAGNOSIS — Z510 Encounter for antineoplastic radiation therapy: Secondary | ICD-10-CM | POA: Diagnosis not present

## 2023-04-16 DIAGNOSIS — I10 Essential (primary) hypertension: Secondary | ICD-10-CM | POA: Diagnosis not present

## 2023-04-16 DIAGNOSIS — J449 Chronic obstructive pulmonary disease, unspecified: Secondary | ICD-10-CM | POA: Diagnosis not present

## 2023-04-16 DIAGNOSIS — R110 Nausea: Secondary | ICD-10-CM | POA: Diagnosis not present

## 2023-04-16 DIAGNOSIS — R911 Solitary pulmonary nodule: Secondary | ICD-10-CM | POA: Diagnosis not present

## 2023-04-16 DIAGNOSIS — C3411 Malignant neoplasm of upper lobe, right bronchus or lung: Secondary | ICD-10-CM | POA: Diagnosis not present

## 2023-04-18 DIAGNOSIS — R911 Solitary pulmonary nodule: Secondary | ICD-10-CM | POA: Diagnosis not present

## 2023-04-18 DIAGNOSIS — G4700 Insomnia, unspecified: Secondary | ICD-10-CM | POA: Diagnosis not present

## 2023-04-18 DIAGNOSIS — J439 Emphysema, unspecified: Secondary | ICD-10-CM | POA: Diagnosis not present

## 2023-04-18 DIAGNOSIS — J8410 Pulmonary fibrosis, unspecified: Secondary | ICD-10-CM | POA: Diagnosis not present

## 2023-04-18 DIAGNOSIS — R110 Nausea: Secondary | ICD-10-CM | POA: Diagnosis not present

## 2023-04-18 DIAGNOSIS — Z510 Encounter for antineoplastic radiation therapy: Secondary | ICD-10-CM | POA: Diagnosis not present

## 2023-04-18 DIAGNOSIS — C3411 Malignant neoplasm of upper lobe, right bronchus or lung: Secondary | ICD-10-CM | POA: Diagnosis not present

## 2023-04-18 DIAGNOSIS — J449 Chronic obstructive pulmonary disease, unspecified: Secondary | ICD-10-CM | POA: Diagnosis not present

## 2023-04-18 DIAGNOSIS — I10 Essential (primary) hypertension: Secondary | ICD-10-CM | POA: Diagnosis not present

## 2023-04-18 DIAGNOSIS — I272 Pulmonary hypertension, unspecified: Secondary | ICD-10-CM | POA: Diagnosis not present

## 2023-04-20 ENCOUNTER — Other Ambulatory Visit: Payer: Self-pay | Admitting: Internal Medicine

## 2023-04-20 DIAGNOSIS — Z9889 Other specified postprocedural states: Secondary | ICD-10-CM

## 2023-04-20 DIAGNOSIS — I480 Paroxysmal atrial fibrillation: Secondary | ICD-10-CM

## 2023-04-20 DIAGNOSIS — R0600 Dyspnea, unspecified: Secondary | ICD-10-CM

## 2023-04-20 DIAGNOSIS — I639 Cerebral infarction, unspecified: Secondary | ICD-10-CM

## 2023-04-20 DIAGNOSIS — I10 Essential (primary) hypertension: Secondary | ICD-10-CM

## 2023-04-20 DIAGNOSIS — Z79899 Other long term (current) drug therapy: Secondary | ICD-10-CM

## 2023-04-23 ENCOUNTER — Telehealth: Payer: Self-pay | Admitting: Internal Medicine

## 2023-04-23 NOTE — Telephone Encounter (Signed)
Eliquis 5mg  refill request received. Patient is 82 years old, weight-88.5kg, Crea-0.97 on 01/18/23, Diagnosis-Afib, and last seen by Dr. Graciela Husbands on 09/17/22.  Eliquis refill requested. Pt had pradaxa 150mg  BID sent in on 01/02/23 by Dr. Lucianne Muss while in the hospital.  Will not send in this refill as pt is on pradaxa.

## 2023-04-23 NOTE — Telephone Encounter (Signed)
Lvm to pt's daughter regarding scheduling pt's awv

## 2023-05-01 ENCOUNTER — Ambulatory Visit
Admission: RE | Admit: 2023-05-01 | Discharge: 2023-05-01 | Disposition: A | Discharge status: Home / Self Care | Payer: Medicare Other | Source: Ambulatory Visit | Attending: Radiation Oncology | Admitting: Radiation Oncology

## 2023-05-01 ENCOUNTER — Other Ambulatory Visit: Payer: Self-pay

## 2023-05-01 DIAGNOSIS — M5136 Other intervertebral disc degeneration, lumbar region: Secondary | ICD-10-CM | POA: Diagnosis not present

## 2023-05-01 DIAGNOSIS — G8929 Other chronic pain: Secondary | ICD-10-CM | POA: Diagnosis not present

## 2023-05-01 DIAGNOSIS — J4489 Other specified chronic obstructive pulmonary disease: Secondary | ICD-10-CM | POA: Diagnosis not present

## 2023-05-01 DIAGNOSIS — C3411 Malignant neoplasm of upper lobe, right bronchus or lung: Secondary | ICD-10-CM | POA: Insufficient documentation

## 2023-05-01 DIAGNOSIS — Z8673 Personal history of transient ischemic attack (TIA), and cerebral infarction without residual deficits: Secondary | ICD-10-CM | POA: Diagnosis not present

## 2023-05-01 DIAGNOSIS — Z510 Encounter for antineoplastic radiation therapy: Secondary | ICD-10-CM | POA: Diagnosis not present

## 2023-05-01 DIAGNOSIS — E039 Hypothyroidism, unspecified: Secondary | ICD-10-CM | POA: Diagnosis not present

## 2023-05-01 DIAGNOSIS — Z7902 Long term (current) use of antithrombotics/antiplatelets: Secondary | ICD-10-CM | POA: Insufficient documentation

## 2023-05-01 DIAGNOSIS — G4700 Insomnia, unspecified: Secondary | ICD-10-CM | POA: Insufficient documentation

## 2023-05-01 DIAGNOSIS — Z79890 Hormone replacement therapy (postmenopausal): Secondary | ICD-10-CM | POA: Insufficient documentation

## 2023-05-01 DIAGNOSIS — Z79899 Other long term (current) drug therapy: Secondary | ICD-10-CM | POA: Diagnosis not present

## 2023-05-01 DIAGNOSIS — J449 Chronic obstructive pulmonary disease, unspecified: Secondary | ICD-10-CM | POA: Diagnosis not present

## 2023-05-01 DIAGNOSIS — I10 Essential (primary) hypertension: Secondary | ICD-10-CM | POA: Diagnosis not present

## 2023-05-01 DIAGNOSIS — Z801 Family history of malignant neoplasm of trachea, bronchus and lung: Secondary | ICD-10-CM | POA: Diagnosis not present

## 2023-05-01 DIAGNOSIS — R110 Nausea: Secondary | ICD-10-CM | POA: Diagnosis not present

## 2023-05-01 DIAGNOSIS — F17210 Nicotine dependence, cigarettes, uncomplicated: Secondary | ICD-10-CM | POA: Insufficient documentation

## 2023-05-01 LAB — RAD ONC ARIA SESSION SUMMARY
Course Elapsed Days: 0
Plan Fractions Treated to Date: 1
Plan Prescribed Dose Per Fraction: 12 Gy
Plan Total Fractions Prescribed: 5
Plan Total Prescribed Dose: 60 Gy
Reference Point Dosage Given to Date: 12 Gy
Reference Point Session Dosage Given: 12 Gy
Session Number: 1

## 2023-05-03 ENCOUNTER — Other Ambulatory Visit: Payer: Self-pay

## 2023-05-03 ENCOUNTER — Ambulatory Visit
Admission: RE | Admit: 2023-05-03 | Discharge: 2023-05-03 | Disposition: A | Discharge status: Home / Self Care | Payer: Medicare Other | Source: Ambulatory Visit | Attending: Radiation Oncology | Admitting: Radiation Oncology

## 2023-05-03 DIAGNOSIS — G4700 Insomnia, unspecified: Secondary | ICD-10-CM | POA: Diagnosis not present

## 2023-05-03 DIAGNOSIS — C3411 Malignant neoplasm of upper lobe, right bronchus or lung: Secondary | ICD-10-CM | POA: Diagnosis not present

## 2023-05-03 DIAGNOSIS — R110 Nausea: Secondary | ICD-10-CM | POA: Diagnosis not present

## 2023-05-03 DIAGNOSIS — Z510 Encounter for antineoplastic radiation therapy: Secondary | ICD-10-CM | POA: Diagnosis not present

## 2023-05-03 DIAGNOSIS — J449 Chronic obstructive pulmonary disease, unspecified: Secondary | ICD-10-CM | POA: Diagnosis not present

## 2023-05-03 DIAGNOSIS — I10 Essential (primary) hypertension: Secondary | ICD-10-CM | POA: Diagnosis not present

## 2023-05-03 LAB — RAD ONC ARIA SESSION SUMMARY
Course Elapsed Days: 2
Plan Fractions Treated to Date: 2
Plan Prescribed Dose Per Fraction: 12 Gy
Plan Total Fractions Prescribed: 5
Plan Total Prescribed Dose: 60 Gy
Reference Point Dosage Given to Date: 24 Gy
Reference Point Session Dosage Given: 12 Gy
Session Number: 2

## 2023-05-06 ENCOUNTER — Other Ambulatory Visit: Payer: Self-pay

## 2023-05-06 ENCOUNTER — Ambulatory Visit
Admission: RE | Admit: 2023-05-06 | Discharge: 2023-05-06 | Disposition: A | Discharge status: Home / Self Care | Payer: Medicare Other | Source: Ambulatory Visit | Attending: Radiation Oncology | Admitting: Radiation Oncology

## 2023-05-06 DIAGNOSIS — I10 Essential (primary) hypertension: Secondary | ICD-10-CM | POA: Diagnosis not present

## 2023-05-06 DIAGNOSIS — J449 Chronic obstructive pulmonary disease, unspecified: Secondary | ICD-10-CM | POA: Diagnosis not present

## 2023-05-06 DIAGNOSIS — C3411 Malignant neoplasm of upper lobe, right bronchus or lung: Secondary | ICD-10-CM | POA: Diagnosis not present

## 2023-05-06 DIAGNOSIS — Z510 Encounter for antineoplastic radiation therapy: Secondary | ICD-10-CM | POA: Diagnosis not present

## 2023-05-06 DIAGNOSIS — R110 Nausea: Secondary | ICD-10-CM | POA: Diagnosis not present

## 2023-05-06 DIAGNOSIS — G4700 Insomnia, unspecified: Secondary | ICD-10-CM | POA: Diagnosis not present

## 2023-05-06 LAB — RAD ONC ARIA SESSION SUMMARY
Course Elapsed Days: 5
Plan Fractions Treated to Date: 3
Plan Prescribed Dose Per Fraction: 12 Gy
Plan Total Fractions Prescribed: 5
Plan Total Prescribed Dose: 60 Gy
Reference Point Dosage Given to Date: 36 Gy
Reference Point Session Dosage Given: 12 Gy
Session Number: 3

## 2023-05-08 ENCOUNTER — Other Ambulatory Visit: Payer: Self-pay

## 2023-05-08 ENCOUNTER — Ambulatory Visit
Admission: RE | Admit: 2023-05-08 | Discharge: 2023-05-08 | Disposition: A | Discharge status: Home / Self Care | Payer: Medicare Other | Source: Ambulatory Visit | Attending: Radiation Oncology | Admitting: Radiation Oncology

## 2023-05-08 DIAGNOSIS — I10 Essential (primary) hypertension: Secondary | ICD-10-CM | POA: Diagnosis not present

## 2023-05-08 DIAGNOSIS — G4700 Insomnia, unspecified: Secondary | ICD-10-CM | POA: Diagnosis not present

## 2023-05-08 DIAGNOSIS — R110 Nausea: Secondary | ICD-10-CM | POA: Diagnosis not present

## 2023-05-08 DIAGNOSIS — J449 Chronic obstructive pulmonary disease, unspecified: Secondary | ICD-10-CM | POA: Diagnosis not present

## 2023-05-08 DIAGNOSIS — C3411 Malignant neoplasm of upper lobe, right bronchus or lung: Secondary | ICD-10-CM | POA: Diagnosis not present

## 2023-05-08 DIAGNOSIS — Z510 Encounter for antineoplastic radiation therapy: Secondary | ICD-10-CM | POA: Diagnosis not present

## 2023-05-08 LAB — RAD ONC ARIA SESSION SUMMARY
Course Elapsed Days: 7
Plan Fractions Treated to Date: 4
Plan Prescribed Dose Per Fraction: 12 Gy
Plan Total Fractions Prescribed: 5
Plan Total Prescribed Dose: 60 Gy
Reference Point Dosage Given to Date: 48 Gy
Reference Point Session Dosage Given: 12 Gy
Session Number: 4

## 2023-05-13 ENCOUNTER — Other Ambulatory Visit: Payer: Self-pay

## 2023-05-13 ENCOUNTER — Ambulatory Visit
Admission: RE | Admit: 2023-05-13 | Discharge: 2023-05-13 | Disposition: A | Discharge status: Home / Self Care | Payer: Medicare Other | Source: Ambulatory Visit | Attending: Radiation Oncology | Admitting: Radiation Oncology

## 2023-05-13 DIAGNOSIS — C3411 Malignant neoplasm of upper lobe, right bronchus or lung: Secondary | ICD-10-CM | POA: Diagnosis not present

## 2023-05-13 DIAGNOSIS — Z510 Encounter for antineoplastic radiation therapy: Secondary | ICD-10-CM | POA: Diagnosis not present

## 2023-05-13 DIAGNOSIS — R911 Solitary pulmonary nodule: Secondary | ICD-10-CM | POA: Diagnosis not present

## 2023-05-13 DIAGNOSIS — I10 Essential (primary) hypertension: Secondary | ICD-10-CM | POA: Diagnosis not present

## 2023-05-13 DIAGNOSIS — R110 Nausea: Secondary | ICD-10-CM | POA: Diagnosis not present

## 2023-05-13 DIAGNOSIS — J449 Chronic obstructive pulmonary disease, unspecified: Secondary | ICD-10-CM | POA: Diagnosis not present

## 2023-05-13 DIAGNOSIS — G4700 Insomnia, unspecified: Secondary | ICD-10-CM | POA: Diagnosis not present

## 2023-05-13 LAB — RAD ONC ARIA SESSION SUMMARY
Course Elapsed Days: 12
Plan Fractions Treated to Date: 5
Plan Prescribed Dose Per Fraction: 12 Gy
Plan Total Fractions Prescribed: 5
Plan Total Prescribed Dose: 60 Gy
Reference Point Dosage Given to Date: 60 Gy
Reference Point Session Dosage Given: 12 Gy
Session Number: 5

## 2023-05-20 ENCOUNTER — Other Ambulatory Visit: Payer: Self-pay | Admitting: Internal Medicine

## 2023-05-20 DIAGNOSIS — Z9889 Other specified postprocedural states: Secondary | ICD-10-CM

## 2023-05-20 DIAGNOSIS — Z79899 Other long term (current) drug therapy: Secondary | ICD-10-CM

## 2023-05-20 DIAGNOSIS — I10 Essential (primary) hypertension: Secondary | ICD-10-CM

## 2023-05-20 DIAGNOSIS — I639 Cerebral infarction, unspecified: Secondary | ICD-10-CM

## 2023-05-20 DIAGNOSIS — R0600 Dyspnea, unspecified: Secondary | ICD-10-CM

## 2023-05-20 DIAGNOSIS — I480 Paroxysmal atrial fibrillation: Secondary | ICD-10-CM

## 2023-05-28 ENCOUNTER — Ambulatory Visit: Payer: Medicare Other | Attending: Internal Medicine | Admitting: Internal Medicine

## 2023-05-28 ENCOUNTER — Encounter: Payer: Self-pay | Admitting: Internal Medicine

## 2023-05-28 VITALS — BP 142/62 | HR 88 | Ht 64.0 in | Wt 200.2 lb

## 2023-05-28 DIAGNOSIS — R0600 Dyspnea, unspecified: Secondary | ICD-10-CM | POA: Diagnosis present

## 2023-05-28 DIAGNOSIS — I480 Paroxysmal atrial fibrillation: Secondary | ICD-10-CM | POA: Diagnosis not present

## 2023-05-28 NOTE — Patient Instructions (Addendum)
Medication Instructions:  The current medical regimen is effective;  continue present plan and medications as directed. Please refer to the Current Medication list given to you today.   *If you need a refill on your cardiac medications before your next appointment, please call your pharmacy*   Lab Work: Your provider would like for you to have following labs drawn today Ferritin, CBC, Hemoglobin, Iron Panel.  If you have labs (blood work) drawn today and your tests are completely normal, you will receive your results only by: MyChart Message (if you have MyChart) OR A paper copy in the mail If you have any lab test that is abnormal or we need to change your treatment, we will call you to review the results.   Follow-Up: At [facility], you and your health needs are our priority.  As part of our continuing mission to provide you with exceptional heart care, we have created designated Provider Care Teams.  These Care Teams include your primary Cardiologist (physician) and Advanced Practice Providers (APPs -  Physician Assistants and Nurse Practitioners) who all work together to provide you with the care you need, when you need it.  We recommend signing up for the patient portal called "MyChart".  Sign up information is provided on this After Visit Summary.  MyChart is used to connect with patients for Virtual Visits (Telemedicine).  Patients are able to view lab/test results, encounter notes, upcoming appointments, etc.  Non-urgent messages can be sent to your provider as well.   To learn more about what you can do with MyChart, go to [email or web address].    Your next appointment:   6 months  Provider:   Sherryl Manges, MD

## 2023-05-28 NOTE — Progress Notes (Signed)
Patient Care Team: Lyndon Code, MD as PCP - General (Internal Medicine)   HPI  [name] Douglas is a 82 y.o. female Seen in followup for a loop recorder implanted for Cryptogenic Stroke 5/19 explanted 1/23 recurrent TIA 5/24 manifested by aphasia and blurriness.  Anticoagulation with dabigatran  Also with adenosine responsive [facility] record is not available, but reportedly "heart rates in the 170s.  Was given a 6 mg of adenosine with conversion to sinus rhythm.  "   3/21 A-fib was detected on her monitor.  Antiplatelet therapy was subsequently discontinued (1/22) and anticoagulation with Eliquis was initiated.  No clinical bleeding but has been noted to be iron deficient Interval hospitalization at [facility] for pneumonia with a positive screen for rhinovirus treated with prednisone and nebulizers.  Anemia was reevaluated and concerning for iron deficiency  but no therapy was initiated.  She ended up on home oxygen Interval diagnosis of likely primary bronchogenic carcinoma felt to have comorbidities precluding surgical therapy; has finished her radiation therapy.  Interval nausea and weakness.  Also has standing intolerance and recurrent falls most recently about 6 months ago.  No chest pain but dyspnea on exertion     DATE TEST EF   5/19 Echo   55-60 %   11/21 Echo   55-60 % LA normal   2/22 Echo  55-65%   1.23 Myoview  No ischemia  1/23 Echo  55-65%   5/24 Echo  60-65% E/E' 17.3   Date Cr K Hgb TSH  12/21 0.85 3.8 10.6<,11.7    9/22 0.77 4.6 11.3  1.48<<15.5  7/23 1.17 4.2 11.9    5/24 0.97 4.0 9.3 (FeSat 8%)      Thromboembolic risk factors ( age  -2, HTN-1, TIA/CVA-2, Gender-1) for a CHADSVASc Score of >=6   Records and Results Reviewed   Past Medical History:  Diagnosis Date   Anxiety    Arthritis    "shoulders" (08/20/2014)   Asthma    Chronic lower back pain    Complication of anesthesia    "agitated & restless after knee replacement in 2008"   COPD  (chronic obstructive pulmonary disease) (HCC)    DDD (degenerative disc disease), lumbar    Depression    Diabetes mellitus without complication (HCC)    Diverticulosis    Family history of adverse reaction to anesthesia    GERD (gastroesophageal reflux disease)    High cholesterol    Hypertension    Hypothyroidism    Memory loss    mild   On home oxygen therapy    "2L at night" (08/20/2014)   PAF (paroxysmal atrial fibrillation) (HCC)    Peripheral vascular disease (HCC)    Pre-diabetes    Shortness of breath dyspnea    with exertion   Stroke (HCC)    mild, no deficits    Past Surgical History:  Procedure Laterality Date   CARDIAC CATHETERIZATION  ~ 2007   EXCISIONAL HEMORRHOIDECTOMY  1960's   EYE SURGERY Bilateral    Cataract extraction with IOL   KNEE ARTHROSCOPY Left X 2 <2008   KNEE ARTHROSCOPY WITH SUBCHONDROPLASTY Right 04/19/2016   Procedure: KNEE ARTHROSCOPY WITH SUBCHONDROPLASTY, PARTIAL MENISCECTOMY;  Surgeon: Kennedy Bucker, MD;  Location: [facility] ORS;  Service: Orthopedics;  Laterality: Right;   LOOP RECORDER INSERTION N/A 01/02/2018   Procedure: LOOP RECORDER INSERTION;  Surgeon: Duke Salvia, MD;  Location: [facility] INVASIVE CV LAB;  Service: Cardiovascular;  Laterality: N/A;  LUMBAR LAMINECTOMY/DECOMPRESSION MICRODISCECTOMY N/A 02/06/2021   Procedure: Lumbar Three-Four, Lumbar Four-Five Laminectomy and Foraminotomy;  Surgeon: Barnett Abu, MD;  Location: [facility] OR;  Service: Neurosurgery;  Laterality: N/A;   SHOULDER OPEN ROTATOR CUFF REPAIR Right 2001   TONSILLECTOMY  ~ 1950   TOTAL KNEE ARTHROPLASTY Left 2008   TOTAL KNEE ARTHROPLASTY Right 07/03/2016   Procedure: TOTAL KNEE ARTHROPLASTY;  Surgeon: Kennedy Bucker, MD;  Location: [facility] ORS;  Service: Orthopedics;  Laterality: Right;   TUBAL LIGATION  1973   VAGINAL HYSTERECTOMY  1974    Current Meds  Medication Sig   amLODipine (NORVASC) 5 MG tablet TAKE 1 TABLET BY MOUTH DAILY   ascorbic acid (VITAMIN C) 500 MG  tablet Take 1 tablet (500 mg total) by mouth daily.   atorvastatin (LIPITOR) 80 MG tablet TAKE 1 TABLET BY MOUTH DAILY AT 6PM   dabigatran (PRADAXA) 150 MG CAPS capsule Take 1 capsule (150 mg total) by mouth every 12 (twelve) hours.   diphenhydramine-acetaminophen (TYLENOL PM) 25-500 MG TABS tablet Take 1 tablet by mouth at bedtime as needed.   donepezil (ARICEPT) 10 MG tablet TAKE ONE TABLET BY MOUTH AT BEDTIME   flecainide (TAMBOCOR) 50 MG tablet Take 1 tablet (50 mg total) by mouth 2 (two) times daily. Pt needs to keep appt in Oct for further refills   furosemide (LASIX) 20 MG tablet TAKE 1 TABLET BY MOUTH EVERY OTHER DAY   iron polysaccharides (NIFEREX) 150 MG capsule Take 150 mg by mouth daily.   levothyroxine (SYNTHROID) 88 MCG tablet TAKE 1 TABLET BY MOUTH DAILY   loratadine (ALLERGY RELIEF) 10 MG tablet Take 10 mg by mouth daily.   metoprolol succinate (TOPROL-XL) 50 MG 24 hr tablet TAKE 1 TABLETS BY MOUTH DAILY. TAKE WITH OR IMMEDIATELY FOLLOWING A MEAL   venlafaxine XR (EFFEXOR-XR) 75 MG 24 hr capsule TAKE 2 CAPSULES BY MOUTH DAILY   Vitamin D, Ergocalciferol, (DRISDOL) 1.25 MG (50000 UNIT) CAPS capsule Take 1 capsule by mouth weekly    Allergies  Allergen Reactions   Trazodone And Nefazodone Other (See Comments)    Agitation   Heparin Nausea And Vomiting      Review of Systems negative except from HPI and PMH  Physical Exam BP (!) 142/62 (BP Location: Left Arm, Patient Position: Sitting)   Pulse 88   Ht 5\' 4"  (1.626 m)   Wt 200 lb 3.2 oz (90.8 kg)   SpO2 99%   BMI 34.36 kg/m   Well developed and well nourished in no acute distress HENT normal Neck supple with JVP-6-7 cm Clear Regular rate and rhythm, no  gallop No  murmur Abd-soft with active BS No Clubbing cyanosis  edema Skin-warm and dry A & Oriented  Grossly normal sensory and motor function  ECG sinus at 88 Intervals 18/07/39      Assessment and  Plan  Atrial fib paroxysmal  Dyspnea on exertion  presumed HFpEF  Hypertension  Stroke prior  Anemia   No interval atrial fibrillation of which she is aware.  Continue dabigatran dosed appropriately with estimated GFR 60  Ongoing issues with anemia..  Will repeat ferritin and labs today; event that this is ongoing, Eliquis is associated with a lower risk of GI bleeding and dabigatran.  Probably worth making the switch.  She had been changed her Eliquis to dabigatran because of the interval TIA.  Weakness with standing and recurrent falls.  The context of systolic hypertension and concerned about orthostatic hypotension. >>  We will have her  take metoprolol at night to try to mitigate the orthostasis that was demonstrated.  We discussed the physiology of orthostatic intolerance including gravitational fluid shifts and the impact of hypertensive vascular disease on orthostasis and treatment options.  We discussed pharmacological options including midodrine, pyridostigmine,.  We discussed nonpharmacological options including raising the HOB, isometric contraction upon standing, abdominal binders  thigh sleeves. We emphasized the importance of recognizing the prodrome and sitting prior to falling, safety in the shower and in the bathroom and the avoidance of dehydration   We will begin with a nonpharmacological approach with abdominal binding.

## 2023-05-29 ENCOUNTER — Telehealth: Payer: Self-pay | Admitting: Internal Medicine

## 2023-05-29 LAB — CBC
Hematocrit: 33.9 % — ABNORMAL LOW (ref 34.0–46.6)
Hemoglobin: 11 g/dL — ABNORMAL LOW (ref 11.1–15.9)
MCH: 29.4 pg (ref 26.6–33.0)
MCHC: 32.4 g/dL (ref 31.5–35.7)
MCV: 91 fL (ref 79–97)
Platelets: 309 10*3/uL (ref 150–450)
RBC: 3.74 x10E6/uL — ABNORMAL LOW (ref 3.77–5.28)
RDW: 13.8 % (ref 11.7–15.4)
WBC: 9.9 10*3/uL (ref 3.4–10.8)

## 2023-05-29 LAB — IRON,TIBC AND FERRITIN PANEL
Ferritin: 58 ng/mL (ref 15–150)
Iron Saturation: 11 % — ABNORMAL LOW (ref 15–55)
Iron: 48 ug/dL (ref 27–139)
Total Iron Binding Capacity: 421 ug/dL (ref 250–450)
UIBC: 373 ug/dL — ABNORMAL HIGH (ref 118–369)

## 2023-05-29 NOTE — Telephone Encounter (Signed)
Daughter returned RN's call and requested call back to desk number - [phone].

## 2023-05-29 NOTE — Telephone Encounter (Signed)
The patient's daughter has been called and VM left to call back.

## 2023-05-29 NOTE — Telephone Encounter (Signed)
Pt's daughter is requesting a callback regarding her office visit yesterday. Please advise.

## 2023-06-03 ENCOUNTER — Ambulatory Visit: Payer: Medicare Other | Admitting: Physician Assistant

## 2023-06-03 DIAGNOSIS — E039 Hypothyroidism, unspecified: Secondary | ICD-10-CM | POA: Diagnosis not present

## 2023-06-03 DIAGNOSIS — E559 Vitamin D deficiency, unspecified: Secondary | ICD-10-CM | POA: Diagnosis not present

## 2023-06-03 DIAGNOSIS — I4891 Unspecified atrial fibrillation: Secondary | ICD-10-CM | POA: Diagnosis not present

## 2023-06-03 DIAGNOSIS — Z0000 Encounter for general adult medical examination without abnormal findings: Secondary | ICD-10-CM | POA: Diagnosis not present

## 2023-06-03 DIAGNOSIS — R7303 Prediabetes: Secondary | ICD-10-CM | POA: Diagnosis not present

## 2023-06-03 DIAGNOSIS — E785 Hyperlipidemia, unspecified: Secondary | ICD-10-CM | POA: Diagnosis not present

## 2023-06-03 DIAGNOSIS — G459 Transient cerebral ischemic attack, unspecified: Secondary | ICD-10-CM | POA: Diagnosis not present

## 2023-06-03 DIAGNOSIS — Z23 Encounter for immunization: Secondary | ICD-10-CM | POA: Diagnosis not present

## 2023-06-03 DIAGNOSIS — Z8673 Personal history of transient ischemic attack (TIA), and cerebral infarction without residual deficits: Secondary | ICD-10-CM | POA: Diagnosis not present

## 2023-06-03 DIAGNOSIS — Z7901 Long term (current) use of anticoagulants: Secondary | ICD-10-CM | POA: Diagnosis not present

## 2023-06-03 DIAGNOSIS — J449 Chronic obstructive pulmonary disease, unspecified: Secondary | ICD-10-CM | POA: Diagnosis not present

## 2023-06-03 DIAGNOSIS — M8008XA Age-related osteoporosis with current pathological fracture, vertebra(e), initial encounter for fracture: Secondary | ICD-10-CM | POA: Diagnosis not present

## 2023-06-03 DIAGNOSIS — E7849 Other hyperlipidemia: Secondary | ICD-10-CM | POA: Diagnosis not present

## 2023-06-03 DIAGNOSIS — I10 Essential (primary) hypertension: Secondary | ICD-10-CM | POA: Diagnosis not present

## 2023-06-04 ENCOUNTER — Telehealth: Payer: Self-pay | Admitting: *Deleted

## 2023-06-04 NOTE — Telephone Encounter (Signed)
Daughter called reporting that patient has been coughing up bright red blood this week and last week she was coughing up dark red blood. She is asking if she needs to come in and see Dr Rushie Chestnut. She does not see Med onc

## 2023-06-05 ENCOUNTER — Telehealth: Payer: Self-pay | Admitting: Internal Medicine

## 2023-06-05 DIAGNOSIS — I517 Cardiomegaly: Secondary | ICD-10-CM | POA: Diagnosis not present

## 2023-06-05 DIAGNOSIS — C3411 Malignant neoplasm of upper lobe, right bronchus or lung: Secondary | ICD-10-CM | POA: Diagnosis not present

## 2023-06-05 DIAGNOSIS — J849 Interstitial pulmonary disease, unspecified: Secondary | ICD-10-CM | POA: Diagnosis not present

## 2023-06-05 DIAGNOSIS — R918 Other nonspecific abnormal finding of lung field: Secondary | ICD-10-CM | POA: Diagnosis not present

## 2023-06-05 DIAGNOSIS — Z9981 Dependence on supplemental oxygen: Secondary | ICD-10-CM | POA: Diagnosis not present

## 2023-06-05 DIAGNOSIS — G479 Sleep disorder, unspecified: Secondary | ICD-10-CM | POA: Diagnosis not present

## 2023-06-05 DIAGNOSIS — R0609 Other forms of dyspnea: Secondary | ICD-10-CM | POA: Diagnosis not present

## 2023-06-05 DIAGNOSIS — R042 Hemoptysis: Secondary | ICD-10-CM | POA: Diagnosis not present

## 2023-06-05 NOTE — Telephone Encounter (Signed)
Spoke w/ pt's daughter Carollee Herter. She reports that at pt's last ov w/ Dr. Graciela Husbands on 05/28/23, she was advised:  "Weakness with standing and recurrent falls.  The context of systolic hypertension and concerned about orthostatic hypotension. >>  We will have her take metoprolol at night to try to mitigate the orthostasis that was demonstrated. "  Carollee Herter fills pt's pill box and gives her meds and realized that metoprolol has not been in the box for some time. She called the pharmacy and the last time it was filled was in March, but she never picked it up and does not know how that happened. Pt has not taken metoprolol since that time. Carollee Herter would like to know of Dr. Graciela Husbands wants her to resume this med in light of her recent hypotension. Advised her that I will make Dr. Graciela Husbands aware and call her back w/ his recommendation.  She asks that if we call back and do not get an answer, to please leave a detailed message on her vm.

## 2023-06-05 NOTE — Telephone Encounter (Signed)
Pt c/o medication issue:  1. Name of Medication:   metoprolol succinate (TOPROL-XL) 50 MG 24 hr tablet    2. How are you currently taking this medication (dosage and times per day)? TAKE 1 TABLETS BY MOUTH DAILY. TAKE WITH OR IMMEDIATELY FOLLOWING A MEAL   3. Are you having a reaction (difficulty breathing--STAT)? No  4. What is your medication issue? Pt's daughter wants to know if the pt needs to get this medication filled. Please advise

## 2023-06-06 NOTE — Telephone Encounter (Signed)
Patient evaluated by Dr Meredeth Ide

## 2023-06-10 ENCOUNTER — Other Ambulatory Visit: Payer: Self-pay | Admitting: Physician Assistant

## 2023-06-10 DIAGNOSIS — E039 Hypothyroidism, unspecified: Secondary | ICD-10-CM

## 2023-06-17 NOTE — Telephone Encounter (Signed)
Supine BP at last visit were 150 or so,  given 40 mm Hg drop with standing lets leave her off the metoprolol and thank her for being so attentive Hope this helps SK

## 2023-06-19 ENCOUNTER — Other Ambulatory Visit: Payer: Self-pay | Admitting: Internal Medicine

## 2023-06-19 ENCOUNTER — Encounter: Payer: Self-pay | Admitting: Radiation Oncology

## 2023-06-19 ENCOUNTER — Ambulatory Visit
Admission: RE | Admit: 2023-06-19 | Discharge: 2023-06-19 | Disposition: A | Discharge status: Home / Self Care | Payer: Medicare Other | Source: Ambulatory Visit | Attending: Radiation Oncology | Admitting: Radiation Oncology

## 2023-06-19 ENCOUNTER — Other Ambulatory Visit: Payer: Self-pay | Admitting: *Deleted

## 2023-06-19 VITALS — BP 129/65 | HR 85 | Temp 99.2°F | Resp 16

## 2023-06-19 DIAGNOSIS — R0600 Dyspnea, unspecified: Secondary | ICD-10-CM

## 2023-06-19 DIAGNOSIS — I10 Essential (primary) hypertension: Secondary | ICD-10-CM

## 2023-06-19 DIAGNOSIS — R911 Solitary pulmonary nodule: Secondary | ICD-10-CM | POA: Diagnosis not present

## 2023-06-19 DIAGNOSIS — Z9889 Other specified postprocedural states: Secondary | ICD-10-CM

## 2023-06-19 DIAGNOSIS — Z79899 Other long term (current) drug therapy: Secondary | ICD-10-CM

## 2023-06-19 DIAGNOSIS — I639 Cerebral infarction, unspecified: Secondary | ICD-10-CM

## 2023-06-19 DIAGNOSIS — I480 Paroxysmal atrial fibrillation: Secondary | ICD-10-CM

## 2023-06-19 NOTE — Telephone Encounter (Signed)
Left detailed message as requested by caller with the following message from the provider:  "Supine BP at last visit were 150 or so,  given 40 mm Hg drop with standing lets leave her off the metoprolol and thank her for being so attentive Hope this helps SK "

## 2023-06-19 NOTE — Progress Notes (Signed)
Radiation Oncology Follow up Note  Name: [name] Douglas   Date:   06/19/2023 MRN:  [id number] DOB: 1941-01-09    This 82 y.o. female presents to the clinic today for 1 month follow-up status post SBRT for presumed stage I non-small cell lung cancer of the right lung.  REFERRING PROVIDER: Lyndon Code, MD  HPI: The patient, an 82 year old with multiple comorbidities including anemia, atrial fibrillation, systolic hypertension, and orthostatic hypotension, is one month post stereotactic body radiation therapy (SBRT) for presumed stage one non-small cell lung cancer of the right lung. The patient reports an improvement in breathing and a reduction in coughing to a minor level. The patient has a history of recurrent falls, which is a concern given her age and comorbidities. The patient is also being followed by cardiology for ongoing issues..  COMPLICATIONS OF TREATMENT: none  FOLLOW UP COMPLIANCE: keeps appointments   PHYSICAL EXAM:  BP 129/65   Pulse 85   Temp 99.2 F (37.3 C) (Tympanic)   Resp 16  Elderly female in NAD wheelchair-bound well-developed well-nourished patient in NAD. HEENT reveals PERLA, EOMI, discs not visualized.  Oral cavity is clear. No oral mucosal lesions are identified. Neck is clear without evidence of cervical or supraclavicular adenopathy. Lungs are clear to A&P. Cardiac examination is essentially unremarkable with regular rate and rhythm without murmur rub or thrill. Abdomen is benign with no organomegaly or masses noted. Motor sensory and DTR levels are equal and symmetric in the upper and lower extremities. Cranial nerves II through XII are grossly intact. Proprioception is intact. No peripheral adenopathy or edema is identified. No motor or sensory levels are noted. Crude visual fields are within normal range.  RADIOLOGY RESULTS: CT scan in 3 months ordered  PLAN: Non-small cell lung cancer Status post SBRT for presumed stage one non-small cell lung cancer of  the right lung. Minimal cough. Discussed the expected radiographic changes post SBRT (scarring and contraction over time). -Order CT chest in 3 months (end of January 2025) to assess response to SBRT. -Repeat CT chest 6 months after the first follow-up scan (end of July 2025).  Fall risk History of recurrent falls in the context of systolic hypertension and orthostatic hypotension. Emphasized the importance of fall prevention. -Continue to be cautious to prevent falls.  Atrial fibrillation and anemia Managed by cardiology. -No changes to management at this time.    Carmina Miller, MD

## 2023-06-21 DIAGNOSIS — R0600 Dyspnea, unspecified: Secondary | ICD-10-CM | POA: Diagnosis not present

## 2023-06-24 ENCOUNTER — Emergency Department
Admission: EM | Admit: 2023-06-24 | Discharge: 2023-06-24 | Disposition: A | Discharge status: Home / Self Care | Payer: Medicare Other | Source: Home / Self Care

## 2023-06-24 DIAGNOSIS — E039 Hypothyroidism, unspecified: Secondary | ICD-10-CM | POA: Diagnosis not present

## 2023-06-24 DIAGNOSIS — R930 Abnormal findings on diagnostic imaging of skull and head, not elsewhere classified: Secondary | ICD-10-CM | POA: Diagnosis not present

## 2023-06-24 DIAGNOSIS — M5020 Other cervical disc displacement, unspecified cervical region: Secondary | ICD-10-CM | POA: Diagnosis not present

## 2023-06-24 DIAGNOSIS — R270 Ataxia, unspecified: Secondary | ICD-10-CM | POA: Diagnosis not present

## 2023-06-24 DIAGNOSIS — H538 Other visual disturbances: Secondary | ICD-10-CM | POA: Diagnosis not present

## 2023-06-24 DIAGNOSIS — I4891 Unspecified atrial fibrillation: Secondary | ICD-10-CM | POA: Diagnosis not present

## 2023-06-24 DIAGNOSIS — M47812 Spondylosis without myelopathy or radiculopathy, cervical region: Secondary | ICD-10-CM | POA: Diagnosis not present

## 2023-06-24 DIAGNOSIS — F17210 Nicotine dependence, cigarettes, uncomplicated: Secondary | ICD-10-CM | POA: Diagnosis not present

## 2023-06-24 DIAGNOSIS — I6523 Occlusion and stenosis of bilateral carotid arteries: Secondary | ICD-10-CM | POA: Diagnosis not present

## 2023-06-24 DIAGNOSIS — M4802 Spinal stenosis, cervical region: Secondary | ICD-10-CM | POA: Diagnosis not present

## 2023-06-24 DIAGNOSIS — R519 Headache, unspecified: Secondary | ICD-10-CM | POA: Diagnosis not present

## 2023-06-24 DIAGNOSIS — R590 Localized enlarged lymph nodes: Secondary | ICD-10-CM | POA: Diagnosis not present

## 2023-06-24 DIAGNOSIS — Z85118 Personal history of other malignant neoplasm of bronchus and lung: Secondary | ICD-10-CM | POA: Diagnosis not present

## 2023-06-24 DIAGNOSIS — M542 Cervicalgia: Secondary | ICD-10-CM | POA: Diagnosis not present

## 2023-06-24 DIAGNOSIS — G9389 Other specified disorders of brain: Secondary | ICD-10-CM | POA: Diagnosis not present

## 2023-06-24 DIAGNOSIS — Z8673 Personal history of transient ischemic attack (TIA), and cerebral infarction without residual deficits: Secondary | ICD-10-CM | POA: Diagnosis not present

## 2023-06-24 DIAGNOSIS — I6529 Occlusion and stenosis of unspecified carotid artery: Secondary | ICD-10-CM | POA: Diagnosis not present

## 2023-06-24 DIAGNOSIS — I10 Essential (primary) hypertension: Secondary | ICD-10-CM | POA: Diagnosis not present

## 2023-06-24 DIAGNOSIS — Z20822 Contact with and (suspected) exposure to covid-19: Secondary | ICD-10-CM | POA: Diagnosis not present

## 2023-06-25 DIAGNOSIS — R590 Localized enlarged lymph nodes: Secondary | ICD-10-CM | POA: Diagnosis not present

## 2023-06-25 DIAGNOSIS — M47812 Spondylosis without myelopathy or radiculopathy, cervical region: Secondary | ICD-10-CM | POA: Diagnosis not present

## 2023-06-25 DIAGNOSIS — R930 Abnormal findings on diagnostic imaging of skull and head, not elsewhere classified: Secondary | ICD-10-CM | POA: Diagnosis not present

## 2023-06-25 DIAGNOSIS — M542 Cervicalgia: Secondary | ICD-10-CM | POA: Diagnosis not present

## 2023-06-25 DIAGNOSIS — I6523 Occlusion and stenosis of bilateral carotid arteries: Secondary | ICD-10-CM | POA: Diagnosis not present

## 2023-06-25 DIAGNOSIS — M4802 Spinal stenosis, cervical region: Secondary | ICD-10-CM | POA: Diagnosis not present

## 2023-06-25 DIAGNOSIS — M5020 Other cervical disc displacement, unspecified cervical region: Secondary | ICD-10-CM | POA: Diagnosis not present

## 2023-06-26 ENCOUNTER — Telehealth: Payer: Self-pay | Admitting: Internal Medicine

## 2023-06-26 NOTE — Telephone Encounter (Addendum)
Lvm with daughter, Carollee Herter, to schedule ED follow up-Toni Daughter called back stating they have transferred her pcp and pulmonology care to other providers. Will not be returning. Would not say as to why-Toni

## 2023-07-15 ENCOUNTER — Other Ambulatory Visit: Payer: Self-pay

## 2023-07-15 ENCOUNTER — Emergency Department: Payer: Medicare Other

## 2023-07-15 ENCOUNTER — Inpatient Hospital Stay
Admission: EM | Admit: 2023-07-15 | Discharge: 2023-07-27 | DRG: 193 | Disposition: A | Discharge status: Home Health | Payer: Medicare Other | Attending: Hospitalist | Admitting: Hospitalist

## 2023-07-15 DIAGNOSIS — E1151 Type 2 diabetes mellitus with diabetic peripheral angiopathy without gangrene: Secondary | ICD-10-CM | POA: Diagnosis present

## 2023-07-15 DIAGNOSIS — J700 Acute pulmonary manifestations due to radiation: Secondary | ICD-10-CM | POA: Diagnosis present

## 2023-07-15 DIAGNOSIS — I2510 Atherosclerotic heart disease of native coronary artery without angina pectoris: Secondary | ICD-10-CM | POA: Diagnosis present

## 2023-07-15 DIAGNOSIS — Z7902 Long term (current) use of antithrombotics/antiplatelets: Secondary | ICD-10-CM

## 2023-07-15 DIAGNOSIS — J984 Other disorders of lung: Secondary | ICD-10-CM | POA: Diagnosis not present

## 2023-07-15 DIAGNOSIS — Z801 Family history of malignant neoplasm of trachea, bronchus and lung: Secondary | ICD-10-CM

## 2023-07-15 DIAGNOSIS — J45901 Unspecified asthma with (acute) exacerbation: Secondary | ICD-10-CM | POA: Diagnosis present

## 2023-07-15 DIAGNOSIS — F17210 Nicotine dependence, cigarettes, uncomplicated: Secondary | ICD-10-CM | POA: Diagnosis present

## 2023-07-15 DIAGNOSIS — K219 Gastro-esophageal reflux disease without esophagitis: Secondary | ICD-10-CM | POA: Diagnosis present

## 2023-07-15 DIAGNOSIS — F0390 Unspecified dementia without behavioral disturbance: Secondary | ICD-10-CM | POA: Diagnosis present

## 2023-07-15 DIAGNOSIS — I5032 Chronic diastolic (congestive) heart failure: Secondary | ICD-10-CM | POA: Diagnosis present

## 2023-07-15 DIAGNOSIS — M19011 Primary osteoarthritis, right shoulder: Secondary | ICD-10-CM | POA: Diagnosis present

## 2023-07-15 DIAGNOSIS — I471 Supraventricular tachycardia, unspecified: Secondary | ICD-10-CM | POA: Diagnosis not present

## 2023-07-15 DIAGNOSIS — Z923 Personal history of irradiation: Secondary | ICD-10-CM

## 2023-07-15 DIAGNOSIS — C3411 Malignant neoplasm of upper lobe, right bronchus or lung: Secondary | ICD-10-CM | POA: Diagnosis present

## 2023-07-15 DIAGNOSIS — I480 Paroxysmal atrial fibrillation: Secondary | ICD-10-CM | POA: Diagnosis present

## 2023-07-15 DIAGNOSIS — J189 Pneumonia, unspecified organism: Secondary | ICD-10-CM | POA: Diagnosis not present

## 2023-07-15 DIAGNOSIS — M19012 Primary osteoarthritis, left shoulder: Secondary | ICD-10-CM | POA: Diagnosis present

## 2023-07-15 DIAGNOSIS — E7800 Pure hypercholesterolemia, unspecified: Secondary | ICD-10-CM | POA: Diagnosis present

## 2023-07-15 DIAGNOSIS — I2489 Other forms of acute ischemic heart disease: Secondary | ICD-10-CM | POA: Diagnosis present

## 2023-07-15 DIAGNOSIS — J9811 Atelectasis: Secondary | ICD-10-CM | POA: Diagnosis not present

## 2023-07-15 DIAGNOSIS — Z7951 Long term (current) use of inhaled steroids: Secondary | ICD-10-CM

## 2023-07-15 DIAGNOSIS — J441 Chronic obstructive pulmonary disease with (acute) exacerbation: Secondary | ICD-10-CM | POA: Diagnosis present

## 2023-07-15 DIAGNOSIS — N179 Acute kidney failure, unspecified: Secondary | ICD-10-CM | POA: Diagnosis present

## 2023-07-15 DIAGNOSIS — R590 Localized enlarged lymph nodes: Secondary | ICD-10-CM | POA: Diagnosis not present

## 2023-07-15 DIAGNOSIS — M51369 Other intervertebral disc degeneration, lumbar region without mention of lumbar back pain or lower extremity pain: Secondary | ICD-10-CM | POA: Diagnosis present

## 2023-07-15 DIAGNOSIS — I110 Hypertensive heart disease with heart failure: Secondary | ICD-10-CM | POA: Diagnosis present

## 2023-07-15 DIAGNOSIS — Z8249 Family history of ischemic heart disease and other diseases of the circulatory system: Secondary | ICD-10-CM | POA: Diagnosis not present

## 2023-07-15 DIAGNOSIS — I4719 Other supraventricular tachycardia: Secondary | ICD-10-CM | POA: Diagnosis present

## 2023-07-15 DIAGNOSIS — R0602 Shortness of breath: Secondary | ICD-10-CM | POA: Diagnosis not present

## 2023-07-15 DIAGNOSIS — Z9981 Dependence on supplemental oxygen: Secondary | ICD-10-CM

## 2023-07-15 DIAGNOSIS — J440 Chronic obstructive pulmonary disease with acute lower respiratory infection: Secondary | ICD-10-CM | POA: Diagnosis present

## 2023-07-15 DIAGNOSIS — E039 Hypothyroidism, unspecified: Secondary | ICD-10-CM | POA: Diagnosis present

## 2023-07-15 DIAGNOSIS — C3490 Malignant neoplasm of unspecified part of unspecified bronchus or lung: Secondary | ICD-10-CM | POA: Diagnosis not present

## 2023-07-15 DIAGNOSIS — Z79890 Hormone replacement therapy (postmenopausal): Secondary | ICD-10-CM

## 2023-07-15 DIAGNOSIS — J811 Chronic pulmonary edema: Secondary | ICD-10-CM | POA: Diagnosis not present

## 2023-07-15 DIAGNOSIS — J90 Pleural effusion, not elsewhere classified: Secondary | ICD-10-CM | POA: Diagnosis not present

## 2023-07-15 DIAGNOSIS — R042 Hemoptysis: Secondary | ICD-10-CM | POA: Diagnosis not present

## 2023-07-15 DIAGNOSIS — Z8673 Personal history of transient ischemic attack (TIA), and cerebral infarction without residual deficits: Secondary | ICD-10-CM

## 2023-07-15 DIAGNOSIS — J9621 Acute and chronic respiratory failure with hypoxia: Secondary | ICD-10-CM | POA: Diagnosis not present

## 2023-07-15 DIAGNOSIS — R591 Generalized enlarged lymph nodes: Secondary | ICD-10-CM | POA: Diagnosis present

## 2023-07-15 DIAGNOSIS — Y842 Radiological procedure and radiotherapy as the cause of abnormal reaction of the patient, or of later complication, without mention of misadventure at the time of the procedure: Secondary | ICD-10-CM | POA: Diagnosis present

## 2023-07-15 DIAGNOSIS — J432 Centrilobular emphysema: Secondary | ICD-10-CM | POA: Diagnosis not present

## 2023-07-15 DIAGNOSIS — Z79899 Other long term (current) drug therapy: Secondary | ICD-10-CM

## 2023-07-15 DIAGNOSIS — F419 Anxiety disorder, unspecified: Secondary | ICD-10-CM | POA: Diagnosis present

## 2023-07-15 DIAGNOSIS — G8929 Other chronic pain: Secondary | ICD-10-CM | POA: Diagnosis present

## 2023-07-15 DIAGNOSIS — R918 Other nonspecific abnormal finding of lung field: Secondary | ICD-10-CM | POA: Diagnosis not present

## 2023-07-15 DIAGNOSIS — Z96653 Presence of artificial knee joint, bilateral: Secondary | ICD-10-CM | POA: Diagnosis present

## 2023-07-15 DIAGNOSIS — I10 Essential (primary) hypertension: Secondary | ICD-10-CM

## 2023-07-15 DIAGNOSIS — J449 Chronic obstructive pulmonary disease, unspecified: Secondary | ICD-10-CM | POA: Diagnosis present

## 2023-07-15 DIAGNOSIS — Z833 Family history of diabetes mellitus: Secondary | ICD-10-CM

## 2023-07-15 DIAGNOSIS — Z888 Allergy status to other drugs, medicaments and biological substances status: Secondary | ICD-10-CM

## 2023-07-15 DIAGNOSIS — I517 Cardiomegaly: Secondary | ICD-10-CM | POA: Diagnosis not present

## 2023-07-15 LAB — CBC WITH DIFFERENTIAL/PLATELET
Abs Immature Granulocytes: 0.07 10*3/uL (ref 0.00–0.07)
Basophils Absolute: 0.1 10*3/uL (ref 0.0–0.1)
Basophils Relative: 1 %
Eosinophils Absolute: 0.1 10*3/uL (ref 0.0–0.5)
Eosinophils Relative: 1 %
HCT: 32.4 % — ABNORMAL LOW (ref 36.0–46.0)
Hemoglobin: 10.1 g/dL — ABNORMAL LOW (ref 12.0–15.0)
Immature Granulocytes: 1 %
Lymphocytes Relative: 10 %
Lymphs Abs: 1.1 10*3/uL (ref 0.7–4.0)
MCH: 28.4 pg (ref 26.0–34.0)
MCHC: 31.2 g/dL (ref 30.0–36.0)
MCV: 91 fL (ref 80.0–100.0)
Monocytes Absolute: 1 10*3/uL (ref 0.1–1.0)
Monocytes Relative: 9 %
Neutro Abs: 8.6 10*3/uL — ABNORMAL HIGH (ref 1.7–7.7)
Neutrophils Relative %: 78 %
Platelets: 338 10*3/uL (ref 150–400)
RBC: 3.56 MIL/uL — ABNORMAL LOW (ref 3.87–5.11)
RDW: 14.8 % (ref 11.5–15.5)
WBC: 10.9 10*3/uL — ABNORMAL HIGH (ref 4.0–10.5)
nRBC: 0 % (ref 0.0–0.2)

## 2023-07-15 LAB — COMPREHENSIVE METABOLIC PANEL
ALT: 17 U/L (ref 0–44)
AST: 26 U/L (ref 15–41)
Albumin: 3.6 g/dL (ref 3.5–5.0)
Alkaline Phosphatase: 77 U/L (ref 38–126)
Anion gap: 12 (ref 5–15)
BUN: 24 mg/dL — ABNORMAL HIGH (ref 8–23)
CO2: 24 mmol/L (ref 22–32)
Calcium: 8.7 mg/dL — ABNORMAL LOW (ref 8.9–10.3)
Chloride: 98 mmol/L (ref 98–111)
Creatinine, Ser: 1.44 mg/dL — ABNORMAL HIGH (ref 0.44–1.00)
GFR, Estimated: 36 mL/min — ABNORMAL LOW (ref >60)
Glucose, Bld: 103 mg/dL — ABNORMAL HIGH (ref 70–99)
Potassium: 4.2 mmol/L (ref 3.5–5.1)
Sodium: 134 mmol/L — ABNORMAL LOW (ref 135–145)
Total Bilirubin: 0.9 mg/dL (ref <1.2)
Total Protein: 6.5 g/dL (ref 6.5–8.1)

## 2023-07-15 LAB — BRAIN NATRIURETIC PEPTIDE: B Natriuretic Peptide: 846.1 pg/mL — ABNORMAL HIGH (ref 0.0–100.0)

## 2023-07-15 LAB — TROPONIN I (HIGH SENSITIVITY)
Troponin I (High Sensitivity): 129 ng/L (ref <18)
Troponin I (High Sensitivity): 103 ng/L (ref <18)
Troponin I (High Sensitivity): 78 ng/L — ABNORMAL HIGH (ref <18)

## 2023-07-15 LAB — MAGNESIUM: Magnesium: 2 mg/dL (ref 1.7–2.4)

## 2023-07-15 MED ORDER — DIPHENHYDRAMINE-APAP (SLEEP) 25-500 MG PO TABS: 1.0000 | ORAL_TABLET | Freq: Every evening | ORAL | Status: DC | PRN

## 2023-07-15 MED ORDER — GUAIFENESIN ER 600 MG PO TB12
1200.0000 mg | ORAL_TABLET | Freq: Two times a day (BID) | ORAL | Status: DC
  Administered 2023-07-15 – 2023-07-27 (×24): 1200 mg via ORAL
  Filled 2023-07-15 (×24): qty 2

## 2023-07-15 MED ORDER — SODIUM CHLORIDE 0.9 % IV SOLN: 1.0000 g | INTRAVENOUS | Status: DC

## 2023-07-15 MED ORDER — ONDANSETRON HCL 4 MG PO TABS: 4.0000 mg | ORAL_TABLET | Freq: Four times a day (QID) | ORAL | Status: DC | PRN

## 2023-07-15 MED ORDER — DONEPEZIL HCL 5 MG PO TABS
10.0000 mg | ORAL_TABLET | Freq: Every day | ORAL | Status: DC
  Administered 2023-07-15 – 2023-07-26 (×12): 10 mg via ORAL
  Filled 2023-07-15 (×13): qty 2

## 2023-07-15 MED ORDER — VENLAFAXINE HCL ER 75 MG PO CP24: 75.0000 mg | ORAL_CAPSULE | Freq: Every day | ORAL | Status: DC

## 2023-07-15 MED ORDER — ONDANSETRON HCL 4 MG/2ML IJ SOLN: 4.0000 mg | Freq: Four times a day (QID) | INTRAMUSCULAR | Status: DC | PRN

## 2023-07-15 MED ORDER — ADENOSINE 6 MG/2ML IV SOLN
6.0000 mg | Freq: Once | INTRAVENOUS | Status: AC
  Administered 2023-07-15: 6 mg via INTRAVENOUS
  Filled 2023-07-15: qty 2

## 2023-07-15 MED ORDER — IPRATROPIUM-ALBUTEROL 0.5-2.5 (3) MG/3ML IN SOLN
3.0000 mL | Freq: Once | RESPIRATORY_TRACT | Status: AC
  Administered 2023-07-15: 3 mL via RESPIRATORY_TRACT
  Filled 2023-07-15: qty 3

## 2023-07-15 MED ORDER — FLECAINIDE ACETATE 50 MG PO TABS
50.0000 mg | ORAL_TABLET | Freq: Two times a day (BID) | ORAL | Status: DC
  Administered 2023-07-15 – 2023-07-27 (×24): 50 mg via ORAL
  Filled 2023-07-15 (×26): qty 1

## 2023-07-15 MED ORDER — MOMETASONE FURO-FORMOTEROL FUM 100-5 MCG/ACT IN AERO
2.0000 | INHALATION_SPRAY | Freq: Every day | RESPIRATORY_TRACT | 1 refills | Status: DC
Start: 2023-07-15 — End: 2023-11-28

## 2023-07-15 MED ORDER — IOHEXOL 350 MG/ML SOLN
50.0000 mL | Freq: Once | INTRAVENOUS | Status: AC | PRN
  Administered 2023-07-15: 50 mL via INTRAVENOUS

## 2023-07-15 MED ORDER — FUROSEMIDE 10 MG/ML IJ SOLN: 40.0000 mg | Freq: Once | INTRAMUSCULAR | Status: DC

## 2023-07-15 MED ORDER — ALBUTEROL SULFATE (2.5 MG/3ML) 0.083% IN NEBU: 2.5000 mg | INHALATION_SOLUTION | RESPIRATORY_TRACT | 3 refills | Status: DC | PRN

## 2023-07-15 MED ORDER — DABIGATRAN ETEXILATE MESYLATE 150 MG PO CAPS
150.0000 mg | ORAL_CAPSULE | Freq: Two times a day (BID) | ORAL | Status: DC
  Administered 2023-07-15 – 2023-07-27 (×24): 150 mg via ORAL
  Filled 2023-07-15 (×25): qty 1

## 2023-07-15 MED ORDER — DIPHENHYDRAMINE HCL 25 MG PO CAPS
25.0000 mg | ORAL_CAPSULE | Freq: Every evening | ORAL | Status: DC | PRN
  Administered 2023-07-15 – 2023-07-16 (×2): 25 mg via ORAL
  Filled 2023-07-15 (×2): qty 1

## 2023-07-15 MED ORDER — BUDESONIDE 0.5 MG/2ML IN SUSP
2.0000 mg | Freq: Two times a day (BID) | RESPIRATORY_TRACT | Status: DC
  Administered 2023-07-15: 2 mg via RESPIRATORY_TRACT
  Filled 2023-07-15 (×2): qty 8

## 2023-07-15 MED ORDER — SODIUM CHLORIDE 0.9 % IV SOLN
1.0000 g | Freq: Once | INTRAVENOUS | Status: AC
  Administered 2023-07-15: 1 g via INTRAVENOUS
  Filled 2023-07-15: qty 10

## 2023-07-15 MED ORDER — FUROSEMIDE 40 MG PO TABS: 20.0000 mg | ORAL_TABLET | ORAL | Status: DC

## 2023-07-15 MED ORDER — LEVALBUTEROL HCL 0.63 MG/3ML IN NEBU: 0.6300 mg | INHALATION_SOLUTION | Freq: Four times a day (QID) | RESPIRATORY_TRACT | Status: DC | PRN

## 2023-07-15 MED ORDER — ATORVASTATIN CALCIUM 20 MG PO TABS
80.0000 mg | ORAL_TABLET | ORAL | Status: DC
  Administered 2023-07-15 – 2023-07-26 (×11): 80 mg via ORAL
  Filled 2023-07-15 (×11): qty 4

## 2023-07-15 MED ORDER — LORATADINE 10 MG PO TABS
10.0000 mg | ORAL_TABLET | Freq: Every day | ORAL | Status: DC
  Administered 2023-07-15 – 2023-07-27 (×13): 10 mg via ORAL
  Filled 2023-07-15 (×13): qty 1

## 2023-07-15 MED ORDER — LEVOTHYROXINE SODIUM 88 MCG PO TABS
88.0000 ug | ORAL_TABLET | Freq: Every day | ORAL | Status: DC
  Administered 2023-07-16 – 2023-07-27 (×11): 88 ug via ORAL
  Filled 2023-07-15 (×12): qty 1

## 2023-07-15 MED ORDER — BUDESONIDE 0.25 MG/2ML IN SUSP: 0.2500 mg | Freq: Every day | RESPIRATORY_TRACT | 2 refills | Status: DC

## 2023-07-15 MED ORDER — PREDNISONE 20 MG PO TABS
40.0000 mg | ORAL_TABLET | Freq: Every day | ORAL | Status: DC
  Administered 2023-07-16: 40 mg via ORAL
  Filled 2023-07-15: qty 2

## 2023-07-15 MED ORDER — SODIUM CHLORIDE 0.9 % IV SOLN
500.0000 mg | Freq: Once | INTRAVENOUS | Status: DC
  Administered 2023-07-15: 500 mg via INTRAVENOUS
  Filled 2023-07-15: qty 5

## 2023-07-15 MED ORDER — ARNUITY ELLIPTA 50 MCG/ACT IN AEPB: 1.0000 | INHALATION_SPRAY | Freq: Two times a day (BID) | RESPIRATORY_TRACT | 3 refills | Status: DC

## 2023-07-15 MED ORDER — IPRATROPIUM BROMIDE 0.02 % IN SOLN
0.5000 mg | Freq: Four times a day (QID) | RESPIRATORY_TRACT | Status: DC
  Administered 2023-07-15 – 2023-07-16 (×4): 0.5 mg via RESPIRATORY_TRACT
  Filled 2023-07-15 (×5): qty 2.5

## 2023-07-15 MED ORDER — MOMETASONE FURO-FORMOTEROL FUM 200-5 MCG/ACT IN AERO
2.0000 | INHALATION_SPRAY | Freq: Two times a day (BID) | RESPIRATORY_TRACT | Status: DC
  Administered 2023-07-15 – 2023-07-16 (×3): 2 via RESPIRATORY_TRACT
  Filled 2023-07-15: qty 8.8

## 2023-07-15 MED ORDER — DOXYCYCLINE HYCLATE 100 MG PO TABS
100.0000 mg | ORAL_TABLET | Freq: Two times a day (BID) | ORAL | Status: AC
  Administered 2023-07-15 – 2023-07-19 (×9): 100 mg via ORAL
  Filled 2023-07-15 (×9): qty 1

## 2023-07-15 MED ORDER — MOMETASONE FURO-FORMOTEROL FUM 100-5 MCG/ACT IN AERO
2.0000 | INHALATION_SPRAY | Freq: Two times a day (BID) | RESPIRATORY_TRACT | 1 refills | Status: DC
Start: 2023-07-15 — End: 2023-08-16

## 2023-07-15 MED ORDER — METHYLPREDNISOLONE SODIUM SUCC 125 MG IJ SOLR
125.0000 mg | Freq: Once | INTRAMUSCULAR | Status: AC
  Administered 2023-07-15: 125 mg via INTRAVENOUS
  Filled 2023-07-15: qty 2

## 2023-07-15 MED ORDER — VENLAFAXINE HCL ER 75 MG PO CP24
150.0000 mg | ORAL_CAPSULE | Freq: Every day | ORAL | Status: DC
  Administered 2023-07-16 – 2023-07-27 (×12): 150 mg via ORAL
  Filled 2023-07-15 (×12): qty 2

## 2023-07-15 MED ORDER — AMLODIPINE BESYLATE 5 MG PO TABS
5.0000 mg | ORAL_TABLET | Freq: Every day | ORAL | Status: DC
  Administered 2023-07-15 – 2023-07-26 (×12): 5 mg via ORAL
  Filled 2023-07-15 (×12): qty 1

## 2023-07-15 NOTE — ED Notes (Signed)
Patient to CT at this time

## 2023-07-15 NOTE — ED Notes (Signed)
Critical Result: Trop 129  Larinda Buttery, MD aware

## 2023-07-15 NOTE — Plan of Care (Signed)

## 2023-07-15 NOTE — ED Notes (Addendum)
Patient placed on pads at this time. Larinda Buttery, MD at bedside.

## 2023-07-15 NOTE — H&P (Signed)
History and Physical    [name] Douglas SAY:[id number] DOB: 08-01-1941 DOA: 07/15/2023  PCP: Mick Sell, MD (Confirm with patient/family/NH records and if not entered, this has to be entered at [facility] point of entry) Patient coming from: Home  I have personally briefly reviewed patient's old medical records in [facility]  Chief Complaint: Cough wheezing, coughing up blood, shortness of breath  HPI: [name] Douglas is a 82 y.o. female with medical history significant of stroke on Pradaxa, PAF/paroxysmal SVT, recently diagnosed RUL non-small cell carcinoma status post radiation therapy x 3 weeks in September, COPD Gold stage I, HTN, IIDM, chronic HFpEF, presented with worsening of productive cough wheezing shortness of breath and coughing up streaks of blood.  Symptoms started 7 days ago, patient started to cough up light yellowish sputum, occasionally mixed with streaks of blood, denied any large amount of hemoptysis.  Meantime she also has had wheezing and increasing exertional dyspnea.  She has history of COPD but only uses as needed albuterol.  Denies any chest pain no fever or chills.  Family reported patient has had 2 other similar episodes earlier this year in January and September, both occasions she was diagnosed with pneumonia and both occasion she also coughed up phlegm with streaks of blood and her symptoms resolved after antibiotic treatment.  Patient was diagnosed with RUL non-small cell carcinoma and underwent 3 weeks 5 cycles of radiation therapy in September. ED Course: Patient was found to be in SVT, adenosine injected and broke SVT back to sinus rhythm.  Blood pressure elevated O2 saturation 94% on room air and stabilized on 3 L.  CT angiogram negative for PE but no significant infiltrates or consolidation, possible lymphadenopathy suspicious for metastatic disease.  Bilateral small pleural effusion.  Patient was given IV Solu-Medrol DuoNebs and antibiotics.  Review of  Systems: As per HPI otherwise 14 point review of systems negative.    Past Medical History:  Diagnosis Date   Anxiety    Arthritis    "shoulders" (08/20/2014)   Asthma    Chronic lower back pain    Complication of anesthesia    "agitated & restless after knee replacement in 2008"   COPD (chronic obstructive pulmonary disease) (HCC)    DDD (degenerative disc disease), lumbar    Depression    Diabetes mellitus without complication (HCC)    Diverticulosis    Family history of adverse reaction to anesthesia    GERD (gastroesophageal reflux disease)    High cholesterol    Hypertension    Hypothyroidism    Memory loss    mild   On home oxygen therapy    "2L at night" (08/20/2014)   PAF (paroxysmal atrial fibrillation) (HCC)    Peripheral vascular disease (HCC)    Pre-diabetes    Shortness of breath dyspnea    with exertion   Stroke (HCC)    mild, no deficits    Past Surgical History:  Procedure Laterality Date   CARDIAC CATHETERIZATION  ~ 2007   EXCISIONAL HEMORRHOIDECTOMY  1960's   EYE SURGERY Bilateral    Cataract extraction with IOL   KNEE ARTHROSCOPY Left X 2 <2008   KNEE ARTHROSCOPY WITH SUBCHONDROPLASTY Right 04/19/2016   Procedure: KNEE ARTHROSCOPY WITH SUBCHONDROPLASTY, PARTIAL MENISCECTOMY;  Surgeon: Kennedy Bucker, MD;  Location: [facility] ORS;  Service: Orthopedics;  Laterality: Right;   LOOP RECORDER INSERTION N/A 01/02/2018   Procedure: LOOP RECORDER INSERTION;  Surgeon: Duke Salvia, MD;  Location: [facility] INVASIVE CV LAB;  Service: Cardiovascular;  Laterality: N/A;   LUMBAR LAMINECTOMY/DECOMPRESSION MICRODISCECTOMY N/A 02/06/2021   Procedure: Lumbar Three-Four, Lumbar Four-Five Laminectomy and Foraminotomy;  Surgeon: Barnett Abu, MD;  Location: [facility];  Service: Neurosurgery;  Laterality: N/A;   SHOULDER OPEN ROTATOR CUFF REPAIR Right 2001   TONSILLECTOMY  ~ 1950   TOTAL KNEE ARTHROPLASTY Left 2008   TOTAL KNEE ARTHROPLASTY Right 07/03/2016   Procedure: TOTAL KNEE  ARTHROPLASTY;  Surgeon: Kennedy Bucker, MD;  Location: [facility] ORS;  Service: Orthopedics;  Laterality: Right;   TUBAL LIGATION  1973   VAGINAL HYSTERECTOMY  1974     reports that she has been smoking cigarettes. She has a 13.3 pack-year smoking history. She has never used smokeless tobacco. She reports that she does not drink alcohol and does not use drugs.  Allergies  Allergen Reactions   Trazodone And Nefazodone Other (See Comments)    Agitation   Heparin Nausea And Vomiting    Family History  Problem Relation Age of Onset   Diabetes Mother    Hypertension Mother    Lung cancer Mother    Congestive Heart Failure Father    Congestive Heart Failure Sister     Prior to Admission medications   Medication Sig Start Date End Date Taking? Authorizing Provider  albuterol (PROVENTIL) (2.5 MG/3ML) 0.083% nebulizer solution Take 3 mLs (2.5 mg total) by nebulization every 4 (four) hours as needed for wheezing or shortness of breath. 07/15/23 07/14/24 Yes Wing Schoch, Renae Fickle, MD  Fluticasone Furoate (ARNUITY ELLIPTA) 50 MCG/ACT AEPB Inhale 1 puff into the lungs 2 (two) times daily. 07/15/23 07/14/24 Yes Krystle Oberman, Renae Fickle, MD  mometasone-formoterol [facility]) 100-5 MCG/ACT AERO Inhale 2 puffs into the lungs 2 (two) times daily. 07/15/23  Yes Mikey College T, MD  amLODipine (NORVASC) 5 MG tablet TAKE 1 TABLET BY MOUTH DAILY 03/07/23   McDonough, Salomon Fick, PA-C  atorvastatin (LIPITOR) 80 MG tablet TAKE 1 TABLET BY MOUTH DAILY AT 6PM 02/13/23   McDonough, Salomon Fick, PA-C  dabigatran (PRADAXA) 150 MG CAPS capsule Take 1 capsule (150 mg total) by mouth every 12 (twelve) hours. 01/02/23 01/02/24  Gillis Santa, MD  diphenhydramine-acetaminophen (TYLENOL PM) 25-500 MG TABS tablet Take 1 tablet by mouth at bedtime as needed.    [provider]  donepezil (ARICEPT) 10 MG tablet TAKE ONE TABLET BY MOUTH AT BEDTIME 02/13/23   McDonough, Salomon Fick, PA-C  flecainide (TAMBOCOR) 50 MG tablet TAKE 1 TABLET BY MOUTH TWICE A DAY  06/20/23   Duke Salvia, MD  furosemide (LASIX) 20 MG tablet TAKE 1 TABLET BY MOUTH EVERY OTHER DAY 02/28/23   Duke Salvia, MD  iron polysaccharides (NIFEREX) 150 MG capsule Take 150 mg by mouth daily.    [provider]  levothyroxine (SYNTHROID) 88 MCG tablet TAKE 1 TABLET BY MOUTH DAILY 06/10/23   Lyndon Code, MD  loratadine (ALLERGY RELIEF) 10 MG tablet Take 10 mg by mouth daily.    [provider]  venlafaxine XR (EFFEXOR-XR) 75 MG 24 hr capsule TAKE 2 CAPSULES BY MOUTH DAILY 02/13/23   McDonough, Salomon Fick, PA-C  Vitamin D, Ergocalciferol, (DRISDOL) 1.25 MG (50000 UNIT) CAPS capsule Take 1 capsule by mouth weekly 11/08/22   Carlean Jews, PA-C    Physical Exam: Vitals:   07/15/23 0953 07/15/23 1000 07/15/23 1100 07/15/23 1222  BP:  (!) 147/52 (!) 171/64   Pulse:  85 92   Resp:  (!) 24 (!) 23   Temp:    98  F (36.7 C)  TempSrc:    Oral  SpO2:  96% 98%   Weight: 90.8 kg     Height: 5\' 4"  (1.626 m)       Constitutional: NAD, calm, comfortable Vitals:   07/15/23 0953 07/15/23 1000 07/15/23 1100 07/15/23 1222  BP:  (!) 147/52 (!) 171/64   Pulse:  85 92   Resp:  (!) 24 (!) 23   Temp:    98 F (36.7 C)  TempSrc:    Oral  SpO2:  96% 98%   Weight: 90.8 kg     Height: 5\' 4"  (1.626 m)      Eyes: PERRL, lids and conjunctivae normal ENMT: Mucous membranes are moist. Posterior pharynx clear of any exudate or lesions.Normal dentition.  Neck: normal, supple, no masses, no thyromegaly Respiratory: Diminished breathing sound bilaterally, diffused wheezing, crackles on bilateral lower fields, increasing. breathing effort. No accessory muscle use.  Cardiovascular: Regular rate and rhythm, no murmurs / rubs / gallops. No extremity edema. 2+ pedal pulses. No carotid bruits.  Abdomen: no tenderness, no masses palpated. No hepatosplenomegaly. Bowel sounds positive.  Musculoskeletal: no clubbing / cyanosis. No joint deformity upper and lower extremities. Good ROM,  no contractures. Normal muscle tone.  Skin: no rashes, lesions, ulcers. No induration Neurologic: CN 2-12 grossly intact. Sensation intact, DTR normal. Strength 5/5 in all 4.  Psychiatric: Normal judgment and insight. Alert and oriented x 3. Normal mood.     Labs on Admission: I have personally reviewed following labs and imaging studies  CBC: Recent Labs  Lab 07/15/23 0852  WBC 10.9*  NEUTROABS 8.6*  HGB 10.1*  HCT 32.4*  MCV 91.0  PLT 338   Basic Metabolic Panel: Recent Labs  Lab 07/15/23 0852  NA 134*  K 4.2  CL 98  CO2 24  GLUCOSE 103*  BUN 24*  CREATININE 1.44*  CALCIUM 8.7*  MG 2.0   GFR: Estimated Creatinine Clearance: 32.3 mL/min (A) (by C-G formula based on SCr of 1.44 mg/dL (H)). Liver Function Tests: Recent Labs  Lab 07/15/23 0852  AST 26  ALT 17  ALKPHOS 77  BILITOT 0.9  PROT 6.5  ALBUMIN 3.6   No results for input(s): "LIPASE", "AMYLASE" in the last 168 hours. No results for input(s): "AMMONIA" in the last 168 hours. Coagulation Profile: No results for input(s): "INR", "PROTIME" in the last 168 hours. Cardiac Enzymes: No results for input(s): "CKTOTAL", "CKMB", "CKMBINDEX", "TROPONINI" in the last 168 hours. BNP (last 3 results) No results for input(s): "PROBNP" in the last 8760 hours. HbA1C: No results for input(s): "HGBA1C" in the last 72 hours. CBG: No results for input(s): "GLUCAP" in the last 168 hours. Lipid Profile: No results for input(s): "CHOL", "HDL", "LDLCALC", "TRIG", "CHOLHDL", "LDLDIRECT" in the last 72 hours. Thyroid Function Tests: No results for input(s): "TSH", "T4TOTAL", "FREET4", "T3FREE", "THYROIDAB" in the last 72 hours. Anemia Panel: No results for input(s): "VITAMINB12", "FOLATE", "FERRITIN", "TIBC", "IRON", "RETICCTPCT" in the last 72 hours. Urine analysis:    Component Value Date/Time   COLORURINE STRAW (A) 01/01/2023 1637   APPEARANCEUR CLEAR (A) 01/01/2023 1637   APPEARANCEUR CANCELED 05/28/2022 1546    LABSPEC 1.007 01/01/2023 1637   LABSPEC 1.035 08/02/2014 2331   PHURINE 5.0 01/01/2023 1637   GLUCOSEU >=500 (A) 01/01/2023 1637   GLUCOSEU Negative 08/02/2014 2331   HGBUR NEGATIVE 01/01/2023 1637   BILIRUBINUR NEGATIVE 01/01/2023 1637   BILIRUBINUR CANCELED 05/28/2022 1546   BILIRUBINUR Negative 08/02/2014 2331   KETONESUR NEGATIVE 01/01/2023 1637  PROTEINUR NEGATIVE 01/01/2023 1637   UROBILINOGEN 0.2 03/15/2020 1416   UROBILINOGEN 1.0 08/20/2014 2125   NITRITE NEGATIVE 01/01/2023 1637   LEUKOCYTESUR NEGATIVE 01/01/2023 1637   LEUKOCYTESUR Negative 08/02/2014 2331    Radiological Exams on Admission: CT Angio Chest PE W/Cm &/Or Wo Cm  Result Date: 07/15/2023 CLINICAL DATA:  Shortness of breath with cough for 1 week. Pulmonary embolism suspected, high probability. Recent completion of radiation therapy for lung cancer. EXAM: CT ANGIOGRAPHY CHEST WITH CONTRAST TECHNIQUE: Multidetector CT imaging of the chest was performed using the standard protocol during bolus administration of intravenous contrast. Multiplanar CT image reconstructions and MIPs were obtained to evaluate the vascular anatomy. RADIATION DOSE REDUCTION: This exam was performed according to the departmental dose-optimization program which includes automated exposure control, adjustment of the mA and/or kV according to patient size and/or use of iterative reconstruction technique. CONTRAST:  50mL OMNIPAQUE IOHEXOL 350 MG/ML SOLN COMPARISON:  Radiographs 07/15/2023. Chest CT 03/11/2023 and PET-CT 04/01/2023. FINDINGS: Technical note: Despite efforts by the technologist and patient, mild motion artifact is present on today's exam and could not be eliminated. This reduces exam sensitivity and specificity. Cardiovascular: The pulmonary arteries are well opacified with contrast to the level of the segmental branches. There is no evidence of acute pulmonary embolism. There is central enlargement of the pulmonary arteries.  Atherosclerosis of the aorta, great vessels and coronary arteries without acute systemic arterial abnormalities. Probable calcifications of the aortic valve and mitral annulus. The heart is mildly enlarged. No significant pericardial fluid. Mediastinum/Nodes: Interval increase in right paratracheal and right hilar adenopathy. There is a right paratracheal node measuring 1.9 cm on image 44/4 and a right hilar node measuring 1.5 cm on image 58/4. No axillary or contralateral mediastinal adenopathy. The thyroid gland, trachea and esophagus demonstrate no significant findings. Lungs/Pleura: New small right-greater-than-left pleural effusions with extension into the superior aspect of the right major fissure. Previously demonstrated right upper lobe pulmonary nodule which was hypermetabolic on PET-CT is not well seen, partly obscured by adjacent parenchymal opacity. No new or enlarging pulmonary nodules are identified. Underlying centrilobular and paraseptal emphysema with diffuse subpleural reticulation, as before. Upper abdomen: The visualized upper abdomen appears stable, without acute findings. Musculoskeletal/Chest wall: There is no chest wall mass or suspicious osseous finding. Unchanged mild chronic L1 compression deformity. Unless specific follow-up recommendations are mentioned in the findings or impression sections, no imaging follow-up of any mentioned incidental findings is recommended. Review of the MIP images confirms the above findings. IMPRESSION: 1. No evidence of acute pulmonary embolism or other acute vascular findings. 2. New small right-greater-than-left pleural effusions with extension into the superior aspect of the right major fissure. 3. Interval increase in right paratracheal and right hilar adenopathy. Although potentially reactive, findings are suspicious for metastatic disease. Recommend attention on follow-up. 4. Previously demonstrated right upper lobe pulmonary nodule which was  hypermetabolic on PET-CT is not well seen, partly obscured by adjacent parenchymal opacity attributed to interval radiation therapy. No new or enlarging pulmonary nodules are identified. 5. Aortic Atherosclerosis (ICD10-I70.0) and Emphysema (ICD10-J43.9). Electronically Signed   By: Carey Bullocks M.D.   On: 07/15/2023 10:51   DG Chest Portable 1 View  Result Date: 07/15/2023 CLINICAL DATA:  Shortness of breath EXAM: PORTABLE CHEST 1 VIEW COMPARISON:  01/18/2023, 03/11/2023 FINDINGS: Cardiomegaly. Right worse than left bibasilar airspace consolidation. Known right upper lobe pulmonary nodule, not well seen radiographically. Probable small bilateral pleural effusions. No pneumothorax. IMPRESSION: 1. Right worse than left bibasilar airspace consolidation,  suspicious for pneumonia. 2. Probable small bilateral pleural effusions. Electronically Signed   By: Duanne Guess D.O.   On: 07/15/2023 09:11    EKG: Independently reviewed.  SVT, back to normal sinus rhythm after adenosine x 1 in the ED.  No significant ST changes.  Assessment/Plan Principal Problem:   Hemoptysis Active Problems:   COPD (chronic obstructive pulmonary disease) (HCC)   CAP (community acquired pneumonia)  (please populate well all problems here in Problem List. (For example, if patient is on BP meds at home and you resume or decide to hold them, it is a problem that needs to be her. Same for CAD, COPD, HLD and so on)  Acute hypoxic respiratory failure -Secondary to COPD exacerbation, also possibly pneumonitis, as there is no significant acute infiltrates or consolidation on CT chest. -Continue to wean off O2.  Hemoptysis -PE ruled out, etiology likely secondary to worsening of cough and COPD exacerbation -H&H stable -Plan to continue Pradaxa  Acute COPD exacerbation -Continue p.o. steroid -Add ICS and LABA, also send prescription to her pharmacy -DuoNebs every 6 hours, and as needed  Paroxysmal SVT/PAF -Likely  secondary to acute hypoxia and COPD exacerbation -Broke back to normal sinus rhythm after adenosine ED -Continue flecainide.  Metoprolol was discontinued by cardiology recently as per family. -Continue Pradaxa  Elevated troponins -No chest pains and no ST changes on EKGs.  Likely demand ischemia from SVT and hypoxia -Echocardiogram  Bilateral pleural effusion -Patient appears to be euvolemic, suspect pleural effusion might be malignant metastasis from RUL cancer.  AKI -Hold off diuresis today  RUL non-small cell lung cancer -Status post radiation therapy    DVT prophylaxis: Pradaxa Code Status: Full code Family Communication: Daughter at bedside Disposition Plan: Expect less than 2 midnight hospital stay Consults called: None Admission status: Telemetry observation   Emeline General MD [facility] Pager [phone]  07/15/2023, 1:47 PM

## 2023-07-15 NOTE — ED Triage Notes (Signed)
Pt here with SOB x1 week and coughing up scant amounts of blood this morning.  Pt tach in triage. Pt wears 3L at home. Pt also finished radiation in Sept for cancer. Pt has a hx of a fib.

## 2023-07-15 NOTE — ED Provider Notes (Signed)
[facility] Provider Note    Event Date/Time   First MD Initiated Contact with Patient 07/15/23 0845     (approximate)   History   Chief Complaint Shortness of Breath and Hemoptysis   HPI  [name] Douglas is a 82 y.o. female with past medical history of hypertension, hyperlipidemia, COPD, chronic hypoxic respiratory failure on 3 L, stroke, SVT, atrial fibrillation, and lung cancer who presents to the ED complaining of shortness of breath.  Patient reports that she has had increasing difficulty breathing with cough for the past week, states she had some pain in her left upper back but denies any pain in her chest.  She has not had any fevers but has started coughing up small amounts of blood last night into this morning.  Daughter reports similar episodes of hemoptysis intermittently throughout the year, patient has been on multiple rounds of antibiotics for this.  She also recently completed radiation for lung cancer, has not received chemotherapy.  She has not noticed any pain or swelling in her legs, currently takes Pradaxa for anticoagulation due to recent TIA but is not on full anticoagulation.     Physical Exam   Triage Vital Signs: ED Triage Vitals [07/15/23 0839]  Encounter Vitals Group     BP (!) 104/59     Systolic BP Percentile      Diastolic BP Percentile      Pulse Rate (!) 150     Resp (!) 24     Temp 97.8 F (36.6 C)     Temp Source Oral     SpO2 (!) 84 %     Weight      Height      Head Circumference      Peak Flow      Pain Score      Pain Loc      Pain Education      Exclude from Growth Chart     Most recent vital signs: Vitals:   07/15/23 1000 07/15/23 1100  BP: (!) 147/52 (!) 171/64  Pulse: 85 92  Resp: (!) 24 (!) 23  Temp:    SpO2: 96% 98%    Constitutional: Alert and oriented. Eyes: Conjunctivae are normal. Head: Atraumatic. Nose: No congestion/rhinnorhea. Mouth/Throat: Mucous membranes are moist.   Cardiovascular: Tachycardic, regular rhythm. Grossly normal heart sounds.  2+ radial pulses bilaterally. Respiratory: Tachypneic with increased work of breathing, expiratory wheezing throughout. Gastrointestinal: Soft and nontender. No distention. Musculoskeletal: No lower extremity tenderness nor edema.  Neurologic:  Normal speech and language. No gross focal neurologic deficits are appreciated.    ED Results / Procedures / Treatments   Labs (all labs ordered are listed, but only abnormal results are displayed) Labs Reviewed  CBC WITH DIFFERENTIAL/PLATELET - Abnormal; Notable for the following components:      Result Value   WBC 10.9 (*)    RBC 3.56 (*)    Hemoglobin 10.1 (*)    HCT 32.4 (*)    Neutro Abs 8.6 (*)    All other components within normal limits  COMPREHENSIVE METABOLIC PANEL - Abnormal; Notable for the following components:   Sodium 134 (*)    Glucose, Bld 103 (*)    BUN 24 (*)    Creatinine, Ser 1.44 (*)    Calcium 8.7 (*)    GFR, Estimated 36 (*)    All other components within normal limits  BRAIN NATRIURETIC PEPTIDE - Abnormal; Notable for the following components:  B Natriuretic Peptide 846.1 (*)    All other components within normal limits  BLOOD GAS, VENOUS - Abnormal; Notable for the following components:   pCO2, Ven 43 (*)    All other components within normal limits  TROPONIN I (HIGH SENSITIVITY) - Abnormal; Notable for the following components:   Troponin I (High Sensitivity) 129 (*)    All other components within normal limits  TROPONIN I (HIGH SENSITIVITY) - Abnormal; Notable for the following components:   Troponin I (High Sensitivity) 103 (*)    All other components within normal limits  MAGNESIUM     EKG  ED ECG REPORT I, Chesley Noon, the attending physician, personally viewed and interpreted this ECG.   Date: 07/15/2023  EKG Time: 8:50  Rate: 151  Rhythm: SVT  Axis: Normal  Intervals:none  ST&T Change: Rate related ST  abnormality  ED ECG REPORT I, Chesley Noon, the attending physician, personally viewed and interpreted this ECG.   Date: 07/15/2023  EKG Time: 9:09  Rate: 86  Rhythm: normal sinus rhythm  Axis: Normal  Intervals:none  ST&T Change: Lateral T wave inversions   RADIOLOGY Chest x-ray reviewed and interpreted by me with multifocal infiltrates concerning for pneumonia.  PROCEDURES:  Critical Care performed: Yes, see critical care procedure note(s)  .Critical Care  Performed by: Chesley Noon, MD Authorized by: Chesley Noon, MD   Critical care provider statement:    Critical care time (minutes):  30   Critical care time was exclusive of:  Separately billable procedures and treating other patients and teaching time   Critical care was necessary to treat or prevent imminent or life-threatening deterioration of the following conditions:  Respiratory failure and cardiac failure   Critical care was time spent personally by me on the following activities:  Development of treatment plan with patient or surrogate, discussions with consultants, evaluation of patient's response to treatment, examination of patient, ordering and review of laboratory studies, ordering and review of radiographic studies, ordering and performing treatments and interventions, pulse oximetry, re-evaluation of patient's condition and review of old charts   I assumed direction of critical care for this patient from another provider in my specialty: no     Care discussed with: admitting provider      MEDICATIONS ORDERED IN ED: Medications  cefTRIAXone (ROCEPHIN) 1 g in sodium chloride 0.9 % 100 mL IVPB (1 g Intravenous New Bag/Given 07/15/23 1124)  azithromycin (ZITHROMAX) 500 mg in sodium chloride 0.9 % 250 mL IVPB (has no administration in time range)  ipratropium-albuterol (DUONEB) 0.5-2.5 (3) MG/3ML nebulizer solution 3 mL (3 mLs Nebulization Given 07/15/23 0912)  methylPREDNISolone sodium succinate  (SOLU-MEDROL) 125 mg/2 mL injection 125 mg (125 mg Intravenous Given 07/15/23 0910)  adenosine (ADENOCARD) 6 MG/2ML injection 6 mg (6 mg Intravenous Given 07/15/23 0908)  iohexol (OMNIPAQUE) 350 MG/ML injection 50 mL (50 mLs Intravenous Contrast Given 07/15/23 1015)  ipratropium-albuterol (DUONEB) 0.5-2.5 (3) MG/3ML nebulizer solution 3 mL (3 mLs Nebulization Given 07/15/23 1124)     IMPRESSION / MDM / ASSESSMENT AND PLAN / ED COURSE  I reviewed the triage vital signs and the nursing notes.                              82 y.o. female with past medical history of hypertension, hyperlipidemia, COPD, chronic hypoxic respiratory failure on 3 L, stroke, SVT, atrial fibrillation, and lung cancer who presents to the ED with 1 week of increasing  difficulty breathing and hemoptysis developing overnight.  Patient's presentation is most consistent with acute presentation with potential threat to life or bodily function.  Differential diagnosis includes, but is not limited to, arrhythmia, ACS, PE, pneumonia, sepsis, CHF exacerbation, COPD exacerbation, anemia, electrolyte abnormality, AKI.  Patient ill-appearing and in mild to moderate respiratory distress, noted to be hypoxic to 84% in triage but not wearing her usual nasal cannula at that time, now improved with 3 L.  She does continue to have increased work of breathing following application of oxygen, wheezing noted on exam and we will treat with IV Solu-Medrol and DuoNeb.  EKG shows apparent SVT with rate related ST changes, vagal maneuvers attempted with no change in rhythm.  Patient was given 6 mg IV adenosine with conversion to normal sinus rhythm, follow-up EKG with lateral T wave inversions but no other acute findings.  Chest x-ray and labs are pending at this time, we will further assess with CTA of her chest to rule out PE.  Chest x-ray concerning for pneumonia, CTA negative for PE but does show new pleural effusion and we will treat with IV  antibiotics.  Wheezing improved but still present on reassessment, will give additional DuoNeb.  Labs show elevated troponin but this is downtrending on recheck and suspect it was related to SVT, will hold off on heparin.  Patient also with AKI but no acute electrolyte abnormality, no significant anemia noted but patient does have mild leukocytosis.  VBG reassuring, case discussed with hospitalist for admission.      FINAL CLINICAL IMPRESSION(S) / ED DIAGNOSES   Final diagnoses:  SVT (supraventricular tachycardia) (HCC)  COPD exacerbation (HCC)     Rx / DC Orders   ED Discharge Orders     None        Note:  This document was prepared using Dragon voice recognition software and may include unintentional dictation errors.   Chesley Noon, MD 07/15/23 1146

## 2023-07-16 DIAGNOSIS — J811 Chronic pulmonary edema: Secondary | ICD-10-CM | POA: Diagnosis not present

## 2023-07-16 DIAGNOSIS — J9811 Atelectasis: Secondary | ICD-10-CM | POA: Diagnosis present

## 2023-07-16 DIAGNOSIS — N179 Acute kidney failure, unspecified: Secondary | ICD-10-CM | POA: Diagnosis present

## 2023-07-16 DIAGNOSIS — I2489 Other forms of acute ischemic heart disease: Secondary | ICD-10-CM | POA: Diagnosis present

## 2023-07-16 DIAGNOSIS — E1151 Type 2 diabetes mellitus with diabetic peripheral angiopathy without gangrene: Secondary | ICD-10-CM | POA: Diagnosis present

## 2023-07-16 DIAGNOSIS — J984 Other disorders of lung: Secondary | ICD-10-CM | POA: Diagnosis not present

## 2023-07-16 DIAGNOSIS — J700 Acute pulmonary manifestations due to radiation: Secondary | ICD-10-CM | POA: Diagnosis present

## 2023-07-16 DIAGNOSIS — I5032 Chronic diastolic (congestive) heart failure: Secondary | ICD-10-CM | POA: Diagnosis present

## 2023-07-16 DIAGNOSIS — E039 Hypothyroidism, unspecified: Secondary | ICD-10-CM | POA: Diagnosis present

## 2023-07-16 DIAGNOSIS — J441 Chronic obstructive pulmonary disease with (acute) exacerbation: Secondary | ICD-10-CM | POA: Diagnosis present

## 2023-07-16 DIAGNOSIS — Y842 Radiological procedure and radiotherapy as the cause of abnormal reaction of the patient, or of later complication, without mention of misadventure at the time of the procedure: Secondary | ICD-10-CM | POA: Diagnosis present

## 2023-07-16 DIAGNOSIS — F0390 Unspecified dementia without behavioral disturbance: Secondary | ICD-10-CM | POA: Diagnosis present

## 2023-07-16 DIAGNOSIS — Z923 Personal history of irradiation: Secondary | ICD-10-CM | POA: Diagnosis not present

## 2023-07-16 DIAGNOSIS — J90 Pleural effusion, not elsewhere classified: Secondary | ICD-10-CM | POA: Diagnosis not present

## 2023-07-16 DIAGNOSIS — C3411 Malignant neoplasm of upper lobe, right bronchus or lung: Secondary | ICD-10-CM | POA: Diagnosis present

## 2023-07-16 DIAGNOSIS — I4719 Other supraventricular tachycardia: Secondary | ICD-10-CM | POA: Diagnosis present

## 2023-07-16 DIAGNOSIS — Z79890 Hormone replacement therapy (postmenopausal): Secondary | ICD-10-CM | POA: Diagnosis not present

## 2023-07-16 DIAGNOSIS — J9621 Acute and chronic respiratory failure with hypoxia: Secondary | ICD-10-CM | POA: Diagnosis present

## 2023-07-16 DIAGNOSIS — I110 Hypertensive heart disease with heart failure: Secondary | ICD-10-CM | POA: Diagnosis present

## 2023-07-16 DIAGNOSIS — E7800 Pure hypercholesterolemia, unspecified: Secondary | ICD-10-CM | POA: Diagnosis present

## 2023-07-16 DIAGNOSIS — I480 Paroxysmal atrial fibrillation: Secondary | ICD-10-CM | POA: Diagnosis present

## 2023-07-16 DIAGNOSIS — R042 Hemoptysis: Secondary | ICD-10-CM | POA: Diagnosis present

## 2023-07-16 DIAGNOSIS — J45901 Unspecified asthma with (acute) exacerbation: Secondary | ICD-10-CM | POA: Diagnosis present

## 2023-07-16 DIAGNOSIS — Z8249 Family history of ischemic heart disease and other diseases of the circulatory system: Secondary | ICD-10-CM | POA: Diagnosis not present

## 2023-07-16 DIAGNOSIS — J189 Pneumonia, unspecified organism: Secondary | ICD-10-CM | POA: Diagnosis present

## 2023-07-16 DIAGNOSIS — F17210 Nicotine dependence, cigarettes, uncomplicated: Secondary | ICD-10-CM | POA: Diagnosis present

## 2023-07-16 DIAGNOSIS — I2510 Atherosclerotic heart disease of native coronary artery without angina pectoris: Secondary | ICD-10-CM | POA: Diagnosis present

## 2023-07-16 DIAGNOSIS — J440 Chronic obstructive pulmonary disease with acute lower respiratory infection: Secondary | ICD-10-CM | POA: Diagnosis present

## 2023-07-16 LAB — CBC
HCT: 27.2 % — ABNORMAL LOW (ref 36.0–46.0)
Hemoglobin: 8.8 g/dL — ABNORMAL LOW (ref 12.0–15.0)
MCH: 29.3 pg (ref 26.0–34.0)
MCHC: 32.4 g/dL (ref 30.0–36.0)
MCV: 90.7 fL (ref 80.0–100.0)
Platelets: 275 10*3/uL (ref 150–400)
RBC: 3 MIL/uL — ABNORMAL LOW (ref 3.87–5.11)
RDW: 14.9 % (ref 11.5–15.5)
WBC: 11.8 10*3/uL — ABNORMAL HIGH (ref 4.0–10.5)
nRBC: 0 % (ref 0.0–0.2)

## 2023-07-16 LAB — BASIC METABOLIC PANEL
Anion gap: 9 (ref 5–15)
BUN: 24 mg/dL — ABNORMAL HIGH (ref 8–23)
CO2: 24 mmol/L (ref 22–32)
Calcium: 8.7 mg/dL — ABNORMAL LOW (ref 8.9–10.3)
Chloride: 104 mmol/L (ref 98–111)
Creatinine, Ser: 1 mg/dL (ref 0.44–1.00)
GFR, Estimated: 56 mL/min — ABNORMAL LOW (ref >60)
Glucose, Bld: 129 mg/dL — ABNORMAL HIGH (ref 70–99)
Potassium: 4.5 mmol/L (ref 3.5–5.1)
Sodium: 137 mmol/L (ref 135–145)

## 2023-07-16 MED ORDER — METHYLPREDNISOLONE SODIUM SUCC 40 MG IJ SOLR
40.0000 mg | Freq: Two times a day (BID) | INTRAMUSCULAR | Status: DC
  Administered 2023-07-16 – 2023-07-18 (×4): 40 mg via INTRAVENOUS
  Filled 2023-07-16 (×4): qty 1

## 2023-07-16 MED ORDER — PHENOL 1.4 % MT LIQD
1.0000 | OROMUCOSAL | Status: DC | PRN
  Administered 2023-07-16: 1 via OROMUCOSAL
  Filled 2023-07-16: qty 177

## 2023-07-16 MED ORDER — ONDANSETRON HCL 4 MG/2ML IJ SOLN
4.0000 mg | Freq: Four times a day (QID) | INTRAMUSCULAR | Status: DC | PRN
  Administered 2023-07-16: 4 mg via INTRAVENOUS
  Filled 2023-07-16: qty 2

## 2023-07-16 MED ORDER — BUDESONIDE 0.5 MG/2ML IN SUSP
0.5000 mg | Freq: Two times a day (BID) | RESPIRATORY_TRACT | Status: DC
  Administered 2023-07-16 – 2023-07-18 (×4): 0.5 mg via RESPIRATORY_TRACT
  Filled 2023-07-16 (×4): qty 2

## 2023-07-16 MED ORDER — VITAMIN B-12 500 MCG PO TABS: 500.0000 ug | ORAL_TABLET | Freq: Every day | ORAL | Status: DC

## 2023-07-16 MED ORDER — VITAMIN C 500 MG PO TABS
500.0000 mg | ORAL_TABLET | Freq: Every day | ORAL | Status: DC
  Administered 2023-07-16 – 2023-07-27 (×12): 500 mg via ORAL
  Filled 2023-07-16 (×12): qty 1

## 2023-07-16 MED ORDER — SENNOSIDES-DOCUSATE SODIUM 8.6-50 MG PO TABS: 1.0000 | ORAL_TABLET | Freq: Every evening | ORAL | Status: DC | PRN

## 2023-07-16 MED ORDER — HYDRALAZINE HCL 20 MG/ML IJ SOLN: 10.0000 mg | INTRAMUSCULAR | Status: DC | PRN

## 2023-07-16 MED ORDER — VITAMIN B-12 1000 MCG PO TABS
500.0000 ug | ORAL_TABLET | Freq: Every day | ORAL | Status: DC
  Administered 2023-07-16 – 2023-07-27 (×12): 500 ug via ORAL
  Filled 2023-07-16 (×12): qty 1

## 2023-07-16 MED ORDER — IPRATROPIUM BROMIDE 0.02 % IN SOLN
0.5000 mg | Freq: Three times a day (TID) | RESPIRATORY_TRACT | Status: DC
  Administered 2023-07-17: 0.5 mg via RESPIRATORY_TRACT
  Filled 2023-07-16: qty 2.5

## 2023-07-16 MED ORDER — ACETAMINOPHEN 325 MG PO TABS
650.0000 mg | ORAL_TABLET | Freq: Four times a day (QID) | ORAL | Status: DC | PRN
  Administered 2023-07-16 – 2023-07-24 (×2): 650 mg via ORAL
  Filled 2023-07-16 (×2): qty 2

## 2023-07-16 MED ORDER — METOPROLOL TARTRATE 5 MG/5ML IV SOLN: 5.0000 mg | INTRAVENOUS | Status: DC | PRN

## 2023-07-16 NOTE — Evaluation (Signed)
Occupational Therapy Evaluation Patient Details Name: [name] Douglas MRN: [id number] DOB: July 07, 1941 Today's Date: 07/16/2023   History of Present Illness 82 y.o. female presented with worsening of productive cough, wheezing, SOB and coughing up streaks of blood. CT chest findings: no PE, possible lymphadenopathy suspicious for metastatic disease.  Bilateral small pleural effusion. PMHx: stroke, PAF/paroxysmal SVT, recently diagnosed RUL non-small cell carcinoma s/p radiation therapy x 3 weeks in Sept, COPD, HTN, IIDM, chronic HFpEF.   Clinical Impression   Pt was seen for OT evaluation this date. Prior to hospital admission, pt was living alone in a 1 level apartment. She was MOD I with ADL and mobility using rollator. Reports sitting on rollator to move through the house with very little walking done at baseline. Daughter comes to assist with bathing in tub with tub bench and otherwise pt able to sponge bathe on her own. Does not drive.   Pt presents to acute OT demonstrating impaired ADL performance and functional mobility 2/2 increased SOB/decreased activity tolerance and weakness (See OT problem list for additional functional deficits). Pt reports she has not been sleeping well. Pt currently requires MOD I for bed mobility. SUP for STS from EOB and BSC. SPT to [facility] with SUP/SBA. Able to stand at sink to perform hand hygiene with SUP. Ambulated using RW short distance around the bed to recliner and 02 dropped to mid 80's. Needed SBA for mobility in room. Pt on 2L 02 via [place] on entry at 92%; dropped to 83% with activity, increased to 3L and dropped to 86%; placed back on 2L at rest but sats at 87-88% therefore left seated on 3L at 95%. Son reports increasing pt to 3L when grocery shopping as well. Pt left seated in recliner with setup of food tray to eat breakfast. Education on ECS and PLB provided to pt and her son present in room as well as on DME for improved ease with ADL performance/energy  conservation. Pt would benefit from skilled OT services to address noted impairments and functional limitations (see below for any additional details) in order to maximize safety and independence while minimizing falls risk and caregiver burden. Do anticipate the need for follow up OT services upon acute hospital DC.        If plan is discharge home, recommend the following: A little help with walking and/or transfers;A little help with bathing/dressing/bathroom;Assistance with cooking/housework;Assist for transportation;Help with stairs or ramp for entrance    Functional Status Assessment  Patient has had a recent decline in their functional status and demonstrates the ability to make significant improvements in function in a reasonable and predictable amount of time.  Equipment Recommendations  BSC/3in1;Wheelchair (measurements OT)    Recommendations for Other Services       Precautions / Restrictions Precautions Precautions: Fall Restrictions Weight Bearing Restrictions: No      Mobility Bed Mobility Overal bed mobility: Modified Independent                  Transfers Overall transfer level: Needs assistance Equipment used: Rolling walker (2 wheels) Transfers: Sit to/from Stand Sit to Stand: Supervision           General transfer comment: SUP for STS from EOB x2 and BSC x1      Balance Overall balance assessment: Needs assistance Sitting-balance support: No upper extremity supported, Feet supported Sitting balance-Leahy Scale: Good     Standing balance support: Bilateral upper extremity supported, During functional activity Standing balance-Leahy Scale: Good Standing balance comment:  pt intermittently not using RW from [facility] to sink to wash hands with good balance                           ADL either performed or assessed with clinical judgement   ADL Overall ADL's : Needs assistance/impaired Eating/Feeding: Set up   Grooming: Wash/dry  hands;Standing;Supervision/safety                   Toilet Transfer: Supervision/safety;BSC/3in1;Rolling walker (2 wheels)   Toileting- Clothing Manipulation and Hygiene: Sitting/lateral lean;Sit to/from stand;Supervision/safety         General ADL Comments: SBA for mobility with RW in room     Vision         Perception         Praxis         Pertinent Vitals/Pain Pain Assessment Pain Assessment: No/denies pain     Extremity/Trunk Assessment Upper Extremity Assessment Upper Extremity Assessment: Overall WFL for tasks assessed   Lower Extremity Assessment Lower Extremity Assessment: Generalized weakness       Communication Communication Communication: No apparent difficulties   Cognition Arousal: Alert Behavior During Therapy: WFL for tasks assessed/performed Overall Cognitive Status: Within Functional Limits for tasks assessed                                       General Comments  2L 02 via [place] on entry at 92%; dropped to 83% with activity, increased to 3L and dropped to 86%; placed back on 2L at rest but sats at 87-88% therefore left seated on 3L at 95%    Exercises Other Exercises Other Exercises: Edu in role of OT in acute setting and importance of therapy to maintain strength and ability to perform ADLs and mobility. Other Exercises: Edu on PLB and ECS to use during ADL performance and mobility to prevent overexertion/increased SOB. Other Exercises: Edu on long handled equipment to maximize ease and lower exertion during ADL performance with pt and son verbalizing understanding.   Shoulder Instructions      Home Living Family/patient expects to be discharged to:: Private residence Living Arrangements: Alone Available Help at Discharge: Family;Available PRN/intermittently Type of Home: Apartment Home Access: Level entry (reports 2 steps and bil handrails to back patio)     Home Layout: One level     Bathroom Shower/Tub:  Chief Strategy Officer: Standard Bathroom Accessibility: Yes   Home Equipment: Rollator (4 wheels);Grab bars - toilet;Tub bench          Prior Functioning/Environment Prior Level of Function : Independent/Modified Independent             Mobility Comments: pt reports she mostly moves around seated on rollator; walks very litte at baseline d/t SOB; rides motorized cart at grocery store ADLs Comments: IND/MOD I with ADLs and daughter assists with bathing in shower on tub bench; son and his girlfriend check on pt every night will call first to see if she's up for them to come        OT Problem List: Decreased strength;Decreased activity tolerance;Cardiopulmonary status limiting activity;Impaired balance (sitting and/or standing)      OT Treatment/Interventions: Self-care/ADL training;Therapeutic exercise;Patient/family education;Energy conservation;Therapeutic activities;DME and/or AE instruction    OT Goals(Current goals can be found in the care plan section) Acute Rehab OT Goals Patient Stated Goal: improve breathing/return home OT  Goal Formulation: With patient/family Time For Goal Achievement: 07/30/23 Potential to Achieve Goals: Good ADL Goals Pt Will Perform Lower Body Bathing: with modified independence;sitting/lateral leans;sit to/from stand;with adaptive equipment Pt Will Perform Lower Body Dressing: with modified independence;sit to/from stand;sitting/lateral leans;with adaptive equipment Pt Will Perform Toileting - Clothing Manipulation and hygiene: with modified independence;sitting/lateral leans;sit to/from stand Additional ADL Goal #1: Pt will verbalize 1 learned ECS and PLB technique to prevent overexertion and maximize safety and ease with ADL performance and mobility upon return home.  OT Frequency: Min 1X/week    Co-evaluation              AM-PAC OT "6 Clicks" Daily Activity     Outcome Measure Help from another person eating meals?:  None Help from another person taking care of personal grooming?: None Help from another person toileting, which includes using toliet, bedpan, or urinal?: A Little Help from another person bathing (including washing, rinsing, drying)?: A Little Help from another person to put on and taking off regular upper body clothing?: None Help from another person to put on and taking off regular lower body clothing?: A Little 6 Click Score: 21   End of Session Equipment Utilized During Treatment: Rolling walker (2 wheels);Oxygen  Activity Tolerance: Patient tolerated treatment well;Patient limited by fatigue Patient left: in chair;with call bell/phone within reach;with chair alarm set;with family/visitor present  OT Visit Diagnosis: Other abnormalities of gait and mobility (R26.89)                Time: 8295-6213 OT Time Calculation (min): 36 min Charges:  OT General Charges $OT Visit: 1 Visit OT Evaluation $OT Eval Low Complexity: 1 Low OT Treatments $Self Care/Home Management : 8-22 mins Newel Oien, OTR/L 07/16/23, 10:13 AM  [place] E Rayshon Albaugh 07/16/2023, 10:09 AM

## 2023-07-16 NOTE — Hospital Course (Addendum)
Brief Narrative:  82 year old with history of stroke on Pradaxa, P A-fib/SVT, recently diagnosed RUL lung small cell lung cancer status post radiation in September, COPD, HTN, DM2, CHF with preserved EF admitted for shortness of breath.  Initially found to be in SVT broke with adenosine.  CT chest negative for PE but possible lymphadenopathy from mets.  She also reported of hemoptysis.  Currently being treated for bronchitis/pneumonia with COPD exacerbation.  Hemoptysis appears to have subsided for now.  CT scan showing possible progression of malignancy therefore notified her oncology team.   Assessment & Plan:  Principal Problem:   Hemoptysis Active Problems:   COPD (chronic obstructive pulmonary disease) (HCC)   CAP (community acquired pneumonia)    Acute hypoxic respiratory failure Hemoptysis I suspect combination of COPD exacerbation/pneumonitis and worsening malignancy.  Hemoptysis from persistent coughing and being on Pradaxa.  CT scan does show concerning for pneumonia but possibly worsening of malignancy therefore I have notified Dr. Carmina Miller from radiation oncology to possibly repeat scans in the near future, he is planning of it in January   Acute COPD exacerbation with concerns of bronchitis/pneumonia Continue steroids and bronchodilators at this time.  Continue doxycycline.   Paroxysmal SVT/PAF -Triggered by hypoxia and COPD exacerbation.  Initially in SVT requiring adenosine.  Continue home medication including flecainide and Pradaxa.   Elevated troponins EKG is nonischemic.  I suspect secondary to SVT and underlying pulmonary condition.  Echocardiogram ordered   Bilateral pleural effusion -Patient appears to be euvolemic, suspect pleural effusion might be malignant metastasis from RUL cancer.   AKI Baseline creatinine 0.9, admission 1.44.  Now resolved   RUL non-small cell lung cancer -Status post radiation therapy   DVT prophylaxis: dabigatran (PRADAXA)  capsule 150 mg  Code Status: Full code Family Communication: Daughter present at bedside Continue hospital stay, significant abnormal breath sounds   Subjective: Feels better but still having significant dyspnea with exertion.  Hemoptysis appears to have subsided.  She overall feels better   Examination:  General exam: Appears calm and comfortable  Respiratory system: Diffuse bilateral rhonchi with expiratory wheezing Cardiovascular system: S1 & S2 heard, RRR. No JVD, murmurs, rubs, gallops or clicks. No pedal edema. Gastrointestinal system: Abdomen is nondistended, soft and nontender. No organomegaly or masses felt. Normal bowel sounds heard. Central nervous system: Alert and oriented. No focal neurological deficits. Extremities: Symmetric 5 x 5 power. Skin: No rashes, lesions or ulcers Psychiatry: Judgement and insight appear normal. Mood & affect appropriate.

## 2023-07-16 NOTE — Evaluation (Signed)
Physical Therapy Evaluation Patient Details Name: [name] Douglas MRN: [id number] DOB: 11/13/1940 Today's Date: 07/16/2023  History of Present Illness  82 y.o. female presented with worsening of productive cough, wheezing, SOB and coughing up streaks of blood. CT chest findings: no PE, possible lymphadenopathy suspicious for metastatic disease.  Bilateral small pleural effusion. PMHx: stroke, PAF/paroxysmal SVT, recently diagnosed RUL non-small cell carcinoma s/p radiation therapy x 3 weeks in Sept, COPD, HTN, IIDM, chronic HFpEF.   Clinical Impression  Pt received in chair on 3L of O2 [place] with daughter at bedside and agreed to PT session. Pt reported no pain, however has been experiencing nausea when having cough spells. RN arrived during session to provide pt with medications. Pt performed STS with the use of RW (2wheels) SUP and amb with RW CGA. Pt needed 1 sitting rest break due to fatigue and generalized BLE weakness. VC necessary throughout session for instructed pursed lip breathing. During mobility O2 increased to 4L and completed session in the recliner back to 3L which is pt's baseline. Vitals read 84-98% SpO2 on O2. Pt tolerated Tx well and will continue to benefit from skilled PT sessions to improve endurance, strength, functional mobility, and activity tolerance to maximize safety/return to PLOF following D/C.        If plan is discharge home, recommend the following: A little help with walking and/or transfers;Assist for transportation;Help with stairs or ramp for entrance   Can travel by private vehicle        Equipment Recommendations None recommended by PT  Recommendations for Other Services       Functional Status Assessment Patient has had a recent decline in their functional status and demonstrates the ability to make significant improvements in function in a reasonable and predictable amount of time.     Precautions / Restrictions Precautions Precautions:  Fall Restrictions Weight Bearing Restrictions: No      Mobility  Bed Mobility               General bed mobility comments: Pt received in recliner. Bed mobility not observed    Transfers Overall transfer level: Needs assistance Equipment used: Rolling walker (2 wheels) Transfers: Sit to/from Stand Sit to Stand: Supervision           General transfer comment: Pt performed 2xSTS with the use of RW (2wheels) SUP and did not report any s/sx relative to dizziness    Ambulation/Gait Ambulation/Gait assistance: Contact guard assist Gait Distance (Feet): 60 Feet Assistive device: Rolling walker (2 wheels) Gait Pattern/deviations: Step-to pattern Gait velocity: Decreased     General Gait Details: Pt amb with the use of RW (2wheels) CGA. Pt reported a sense of generalized weakness in BLE  Stairs            Wheelchair Mobility     Tilt Bed    Modified Rankin (Stroke Patients Only)       Balance Overall balance assessment: Needs assistance Sitting-balance support: No upper extremity supported, Feet supported Sitting balance-Leahy Scale: Good     Standing balance support: Bilateral upper extremity supported, During functional activity Standing balance-Leahy Scale: Good                               Pertinent Vitals/Pain Pain Assessment Pain Assessment: No/denies pain    Home Living Family/patient expects to be discharged to:: Private residence Living Arrangements: Alone Available Help at Discharge: Family;Available PRN/intermittently Type of Home: Apartment Home  Access: Level entry (reports 2 steps and bilat handrails to back patio)       Home Layout: One level Home Equipment: Rollator (4 wheels);Grab bars - toilet;Tub bench Additional Comments: recent HHPT    Prior Function Prior Level of Function : Independent/Modified Independent             Mobility Comments: pt reports she mostly moves around seated on rollator; walks  very litte at baseline d/t SOB; rides motorized cart at grocery store ADLs Comments: IND/MOD I with ADLs and daughter assists with bathing in shower on tub bench; son and his girlfriend check on pt every night will call first to see if she's up for them to come     Extremity/Trunk Assessment   Upper Extremity Assessment Upper Extremity Assessment: Overall WFL for tasks assessed    Lower Extremity Assessment Lower Extremity Assessment: Generalized weakness       Communication   Communication Communication: No apparent difficulties  Cognition Arousal: Alert Behavior During Therapy: WFL for tasks assessed/performed Overall Cognitive Status: Within Functional Limits for tasks assessed                                 General Comments: Pt pleasant and willing to participate in PT session.        General Comments General comments (skin integrity, edema, etc.): 2L 02 via [place] on entry at 92%; dropped to 83% with activity, increased to 3L and dropped to 86%; placed back on 2L at rest but sats at 87-88% therefore left seated on 3L at 95%    Exercises     Assessment/Plan    PT Assessment Patient needs continued PT services  PT Problem List Decreased strength;Decreased activity tolerance;Decreased mobility       PT Treatment Interventions DME instruction;Gait training;Functional mobility training    PT Goals (Current goals can be found in the Care Plan section)  Acute Rehab PT Goals Patient Stated Goal: To go home PT Goal Formulation: With patient Time For Goal Achievement: 07/30/23 Potential to Achieve Goals: Good    Frequency Min 1X/week     Co-evaluation               AM-PAC PT "6 Clicks" Mobility  Outcome Measure Help needed turning from your back to your side while in a flat bed without using bedrails?: None Help needed moving from lying on your back to sitting on the side of a flat bed without using bedrails?: A Little Help needed moving to and  from a bed to a chair (including a wheelchair)?: A Little Help needed standing up from a chair using your arms (e.g., wheelchair or bedside chair)?: A Little Help needed to walk in hospital room?: A Little Help needed climbing 3-5 steps with a railing? : A Lot 6 Click Score: 18    End of Session Equipment Utilized During Treatment: Gait belt;Oxygen Activity Tolerance: Patient tolerated treatment well;Patient limited by fatigue Patient left: in chair;with call bell/phone within reach;with chair alarm set;with family/visitor present Nurse Communication: Mobility status PT Visit Diagnosis: Unsteadiness on feet (R26.81);Other abnormalities of gait and mobility (R26.89);Difficulty in walking, not elsewhere classified (R26.2);Muscle weakness (generalized) (M62.81)    Time: 0981-1914 PT Time Calculation (min) (ACUTE ONLY): 18 min   Charges:   PT Evaluation $PT Eval Low Complexity: 1 Low PT Treatments $Gait Training: 8-22 mins PT General Charges $$ ACUTE PT VISIT: 1 Visit  Aiken Withem [employer] SPT, LAT, ATC  Gabriel Conry [place] 07/16/2023, 12:50 PM

## 2023-07-16 NOTE — Progress Notes (Addendum)
PROGRESS NOTE    [name] Douglas  ZOX:[id number] DOB: 03-May-1941 DOA: 07/15/2023 PCP: Mick Sell, MD    Brief Narrative:  82 year old with history of stroke on Pradaxa, P A-fib/SVT, recently diagnosed RUL lung small cell lung cancer status post radiation in September, COPD, HTN, DM2, CHF with preserved EF admitted for shortness of breath.  Initially found to be in SVT broke with adenosine.  CT chest negative for PE but possible lymphadenopathy from mets.  She also reported of hemoptysis.  Currently being treated for bronchitis/pneumonia with COPD exacerbation.  Hemoptysis appears to have subsided for now.  CT scan showing possible progression of malignancy therefore notified her oncology team.   Assessment & Plan:  Principal Problem:   Hemoptysis Active Problems:   COPD (chronic obstructive pulmonary disease) (HCC)   CAP (community acquired pneumonia)    Acute hypoxic respiratory failure Hemoptysis I suspect combination of COPD exacerbation/pneumonitis and worsening malignancy.  Hemoptysis from persistent coughing and being on Pradaxa.  CT scan does show concerning for pneumonia but possibly worsening of malignancy therefore I have notified Dr. Carmina Miller from radiation oncology to possibly repeat scans in the near future, he is planning of it in January   Acute COPD exacerbation with concerns of bronchitis/pneumonia Continue steroids and bronchodilators at this time.  Continue doxycycline.   Paroxysmal SVT/PAF -Triggered by hypoxia and COPD exacerbation.  Initially in SVT requiring adenosine.  Continue home medication including flecainide and Pradaxa.   Elevated troponins EKG is nonischemic.  I suspect secondary to SVT and underlying pulmonary condition.  Echocardiogram ordered   Bilateral pleural effusion -Patient appears to be euvolemic, suspect pleural effusion might be malignant metastasis from RUL cancer.   AKI Baseline creatinine 0.9, admission 1.44.  Now  resolved   RUL non-small cell lung cancer -Status post radiation therapy   DVT prophylaxis: dabigatran (PRADAXA) capsule 150 mg  Code Status: Full code Family Communication: Daughter present at bedside Continue hospital stay, significant abnormal breath sounds   Subjective: Feels better but still having significant dyspnea with exertion.  Hemoptysis appears to have subsided.  She overall feels better   Examination:  General exam: Appears calm and comfortable  Respiratory system: Diffuse bilateral rhonchi with expiratory wheezing Cardiovascular system: S1 & S2 heard, RRR. No JVD, murmurs, rubs, gallops or clicks. No pedal edema. Gastrointestinal system: Abdomen is nondistended, soft and nontender. No organomegaly or masses felt. Normal bowel sounds heard. Central nervous system: Alert and oriented. No focal neurological deficits. Extremities: Symmetric 5 x 5 power. Skin: No rashes, lesions or ulcers Psychiatry: Judgement and insight appear normal. Mood & affect appropriate.          Diet Orders (From admission, onward)     Start     Ordered   07/15/23 1310  Diet Heart Room service appropriate? Yes; Fluid consistency: Thin  Diet effective now       Question Answer Comment  Room service appropriate? Yes   Fluid consistency: Thin      07/15/23 1311            Objective: Vitals:   07/15/23 2207 07/16/23 0401 07/16/23 0753 07/16/23 0801  BP:  (!) 128/54  (!) 111/39  Pulse:  93  88  Resp:  18  20  Temp:  98.7 F (37.1 C)  98 F (36.7 C)  TempSrc:  Oral    SpO2: 92% 94% 97% 99%  Weight:      Height:       No intake or  output data in the 24 hours ending 07/16/23 1323 Filed Weights   07/15/23 0953  Weight: 90.8 kg    Scheduled Meds:  amLODipine  5 mg Oral Daily   vitamin C  500 mg Oral Daily   atorvastatin  80 mg Oral Q24H   budesonide (PULMICORT) nebulizer solution  0.5 mg Nebulization Q12H   vitamin B-12  500 mcg Oral Daily   dabigatran  150 mg Oral  Q12H   donepezil  10 mg Oral QHS   doxycycline  100 mg Oral Q12H   flecainide  50 mg Oral BID   guaiFENesin  1,200 mg Oral BID   ipratropium  0.5 mg Nebulization Q6H   levothyroxine  88 mcg Oral Q0600   loratadine  10 mg Oral Daily   methylPREDNISolone (SOLU-MEDROL) injection  40 mg Intravenous Q12H   mometasone-formoterol  2 puff Inhalation BID   venlafaxine XR  150 mg Oral Q breakfast   Continuous Infusions:  Nutritional status     Body mass index is 34.36 kg/m.  Data Reviewed:   CBC: Recent Labs  Lab 07/15/23 0852 07/16/23 0618  WBC 10.9* 11.8*  NEUTROABS 8.6*  --   HGB 10.1* 8.8*  HCT 32.4* 27.2*  MCV 91.0 90.7  PLT 338 275   Basic Metabolic Panel: Recent Labs  Lab 07/15/23 0852 07/16/23 0618  NA 134* 137  K 4.2 4.5  CL 98 104  CO2 24 24  GLUCOSE 103* 129*  BUN 24* 24*  CREATININE 1.44* 1.00  CALCIUM 8.7* 8.7*  MG 2.0  --    GFR: Estimated Creatinine Clearance: 46.5 mL/min (by C-G formula based on SCr of 1 mg/dL). Liver Function Tests: Recent Labs  Lab 07/15/23 0852  AST 26  ALT 17  ALKPHOS 77  BILITOT 0.9  PROT 6.5  ALBUMIN 3.6   No results for input(s): "LIPASE", "AMYLASE" in the last 168 hours. No results for input(s): "AMMONIA" in the last 168 hours. Coagulation Profile: No results for input(s): "INR", "PROTIME" in the last 168 hours. Cardiac Enzymes: No results for input(s): "CKTOTAL", "CKMB", "CKMBINDEX", "TROPONINI" in the last 168 hours. BNP (last 3 results) No results for input(s): "PROBNP" in the last 8760 hours. HbA1C: No results for input(s): "HGBA1C" in the last 72 hours. CBG: No results for input(s): "GLUCAP" in the last 168 hours. Lipid Profile: No results for input(s): "CHOL", "HDL", "LDLCALC", "TRIG", "CHOLHDL", "LDLDIRECT" in the last 72 hours. Thyroid Function Tests: No results for input(s): "TSH", "T4TOTAL", "FREET4", "T3FREE", "THYROIDAB" in the last 72 hours. Anemia Panel: No results for input(s): "VITAMINB12",  "FOLATE", "FERRITIN", "TIBC", "IRON", "RETICCTPCT" in the last 72 hours. Sepsis Labs: No results for input(s): "PROCALCITON", "LATICACIDVEN" in the last 168 hours.  No results found for this or any previous visit (from the past 240 hour(s)).       Radiology Studies: CT Angio Chest PE W/Cm &/Or Wo Cm  Result Date: 07/15/2023 CLINICAL DATA:  Shortness of breath with cough for 1 week. Pulmonary embolism suspected, high probability. Recent completion of radiation therapy for lung cancer. EXAM: CT ANGIOGRAPHY CHEST WITH CONTRAST TECHNIQUE: Multidetector CT imaging of the chest was performed using the standard protocol during bolus administration of intravenous contrast. Multiplanar CT image reconstructions and MIPs were obtained to evaluate the vascular anatomy. RADIATION DOSE REDUCTION: This exam was performed according to the departmental dose-optimization program which includes automated exposure control, adjustment of the mA and/or kV according to patient size and/or use of iterative reconstruction technique. CONTRAST:  50mL OMNIPAQUE  IOHEXOL 350 MG/ML SOLN COMPARISON:  Radiographs 07/15/2023. Chest CT 03/11/2023 and PET-CT 04/01/2023. FINDINGS: Technical note: Despite efforts by the technologist and patient, mild motion artifact is present on today's exam and could not be eliminated. This reduces exam sensitivity and specificity. Cardiovascular: The pulmonary arteries are well opacified with contrast to the level of the segmental branches. There is no evidence of acute pulmonary embolism. There is central enlargement of the pulmonary arteries. Atherosclerosis of the aorta, great vessels and coronary arteries without acute systemic arterial abnormalities. Probable calcifications of the aortic valve and mitral annulus. The heart is mildly enlarged. No significant pericardial fluid. Mediastinum/Nodes: Interval increase in right paratracheal and right hilar adenopathy. There is a right paratracheal node  measuring 1.9 cm on image 44/4 and a right hilar node measuring 1.5 cm on image 58/4. No axillary or contralateral mediastinal adenopathy. The thyroid gland, trachea and esophagus demonstrate no significant findings. Lungs/Pleura: New small right-greater-than-left pleural effusions with extension into the superior aspect of the right major fissure. Previously demonstrated right upper lobe pulmonary nodule which was hypermetabolic on PET-CT is not well seen, partly obscured by adjacent parenchymal opacity. No new or enlarging pulmonary nodules are identified. Underlying centrilobular and paraseptal emphysema with diffuse subpleural reticulation, as before. Upper abdomen: The visualized upper abdomen appears stable, without acute findings. Musculoskeletal/Chest wall: There is no chest wall mass or suspicious osseous finding. Unchanged mild chronic L1 compression deformity. Unless specific follow-up recommendations are mentioned in the findings or impression sections, no imaging follow-up of any mentioned incidental findings is recommended. Review of the MIP images confirms the above findings. IMPRESSION: 1. No evidence of acute pulmonary embolism or other acute vascular findings. 2. New small right-greater-than-left pleural effusions with extension into the superior aspect of the right major fissure. 3. Interval increase in right paratracheal and right hilar adenopathy. Although potentially reactive, findings are suspicious for metastatic disease. Recommend attention on follow-up. 4. Previously demonstrated right upper lobe pulmonary nodule which was hypermetabolic on PET-CT is not well seen, partly obscured by adjacent parenchymal opacity attributed to interval radiation therapy. No new or enlarging pulmonary nodules are identified. 5. Aortic Atherosclerosis (ICD10-I70.0) and Emphysema (ICD10-J43.9). Electronically Signed   By: Carey Bullocks M.D.   On: 07/15/2023 10:51   DG Chest Portable 1 View  Result Date:  07/15/2023 CLINICAL DATA:  Shortness of breath EXAM: PORTABLE CHEST 1 VIEW COMPARISON:  01/18/2023, 03/11/2023 FINDINGS: Cardiomegaly. Right worse than left bibasilar airspace consolidation. Known right upper lobe pulmonary nodule, not well seen radiographically. Probable small bilateral pleural effusions. No pneumothorax. IMPRESSION: 1. Right worse than left bibasilar airspace consolidation, suspicious for pneumonia. 2. Probable small bilateral pleural effusions. Electronically Signed   By: Duanne Guess D.O.   On: 07/15/2023 09:11           LOS: 0 days   Time spent= 35 mins    Miguel Rota, MD [facility]  If 7PM-7AM, please contact night-coverage  07/16/2023, 1:23 PM

## 2023-07-17 ENCOUNTER — Telehealth: Payer: Self-pay | Admitting: Physician Assistant

## 2023-07-17 DIAGNOSIS — R042 Hemoptysis: Secondary | ICD-10-CM | POA: Diagnosis not present

## 2023-07-17 LAB — MAGNESIUM: Magnesium: 2.1 mg/dL (ref 1.7–2.4)

## 2023-07-17 LAB — CBC
HCT: 28.3 % — ABNORMAL LOW (ref 36.0–46.0)
Hemoglobin: 9.1 g/dL — ABNORMAL LOW (ref 12.0–15.0)
MCH: 29.2 pg (ref 26.0–34.0)
MCHC: 32.2 g/dL (ref 30.0–36.0)
MCV: 90.7 fL (ref 80.0–100.0)
Platelets: 306 10*3/uL (ref 150–400)
RBC: 3.12 MIL/uL — ABNORMAL LOW (ref 3.87–5.11)
RDW: 15.1 % (ref 11.5–15.5)
WBC: 12.1 10*3/uL — ABNORMAL HIGH (ref 4.0–10.5)
nRBC: 0 % (ref 0.0–0.2)

## 2023-07-17 LAB — BASIC METABOLIC PANEL WITH GFR
Anion gap: 8 (ref 5–15)
BUN: 30 mg/dL — ABNORMAL HIGH (ref 8–23)
CO2: 25 mmol/L (ref 22–32)
Calcium: 8.8 mg/dL — ABNORMAL LOW (ref 8.9–10.3)
Chloride: 104 mmol/L (ref 98–111)
Creatinine, Ser: 0.94 mg/dL (ref 0.44–1.00)
GFR, Estimated: >60 mL/min
Glucose, Bld: 172 mg/dL — ABNORMAL HIGH (ref 70–99)
Potassium: 4.6 mmol/L (ref 3.5–5.1)
Sodium: 137 mmol/L (ref 135–145)

## 2023-07-17 LAB — PHOSPHORUS: Phosphorus: 2.7 mg/dL (ref 2.5–4.6)

## 2023-07-17 MED ORDER — IPRATROPIUM BROMIDE 0.02 % IN SOLN
0.5000 mg | Freq: Four times a day (QID) | RESPIRATORY_TRACT | Status: DC
  Administered 2023-07-17: 0.5 mg via RESPIRATORY_TRACT
  Filled 2023-07-17: qty 2.5

## 2023-07-17 MED ORDER — MELATONIN 5 MG PO TABS: 2.5000 mg | ORAL_TABLET | Freq: Every day | ORAL | Status: DC

## 2023-07-17 MED ORDER — MELATONIN 5 MG PO TABS
5.0000 mg | ORAL_TABLET | Freq: Every day | ORAL | Status: DC
  Administered 2023-07-17 – 2023-07-26 (×9): 5 mg via ORAL
  Filled 2023-07-17 (×10): qty 1

## 2023-07-17 MED ORDER — ARFORMOTEROL TARTRATE 15 MCG/2ML IN NEBU
15.0000 ug | INHALATION_SOLUTION | Freq: Two times a day (BID) | RESPIRATORY_TRACT | Status: DC
  Administered 2023-07-17 – 2023-07-23 (×11): 15 ug via RESPIRATORY_TRACT
  Filled 2023-07-17 (×15): qty 2

## 2023-07-17 NOTE — TOC Initial Note (Addendum)
Transition of Care [facility]) - Initial/Assessment Note    Patient Details  Name: [name] Douglas MRN: [id number] Date of Birth: Jan 14, 1941  Transition of [facility]) CM/SW Contact:    Margarito Liner, LCSW Phone Number: 07/17/2023, 12:30 PM  Clinical Narrative:   Readmission prevention screen complete. CSW met with patient. Daughter and daughter-in-law at bedside. CSW introduced role and explained that discharge planning would be discussed. PCP is Clydie Braun, MD. Family transports her to appointments. She uses [facility] [employer] and they deliver her medications. Patient lives home alone. No home health prior to admission. She has a rollator, rails in the bathroom, and home oxygen (3L) through [facility]. Patient and family are agreeable to home health PT and OT. They prefer [facility] and they have accepted the referral. DME recommendations for 3-in-1 and wheelchair. Patient is agreeable to 3-in-1 but declined wheelchair. Ordered 3-in-1 through [facility] and nebulizer through [facility]. No further concerns. CSW encouraged patient and her family to contact CSW as needed. CSW will continue to follow patient and her family for support and facilitate return home once stable.            3:03 pm: Patient received a 3-in-1 in 2021 so she would have to private pay to get a new one. Daughter will obtain from an outside store.  3:37 pm: CSW called [facility]. They do not have anyone to deliver the nebulizer to [facility] at this time and stated it would likely be next week before they can deliver it. They will call family to notify. MD is aware. CSW called [facility] and they confirmed they will be closed tomorrow for the Thanksgiving holiday.  Expected Discharge Plan: Home w Home Health Services     Patient Goals and CMS Choice            Expected Discharge Plan and Services                         DME Arranged: 3-N-1, Nebulizer  machine DME Agency: [facility], Other - Comment ([facility]) Date DME Agency Contacted: 07/17/23   Representative spoke with at DME Agency: [facility]: Yvone Neu, [facility]: Rocky Morel [facility] Arranged: PT, OT [facility] Agency: [facility] ([facility]) Date [facility] Agency Contacted: 07/17/23   Representative spoke with at [facility] Agency: Duwaine Maxin  Prior Living Arrangements/Services     Patient language and need for interpreter reviewed:: Yes Do you feel safe going back to the place where you live?: Yes      Need for Family Participation in Patient Care: Yes (Comment) Care giver support system in place?: Yes (comment)   Criminal Activity/Legal Involvement Pertinent to Current Situation/Hospitalization: No - Comment as needed  Activities of Daily Living   ADL Screening (condition at time of admission) Independently performs ADLs?: Yes (appropriate for developmental age) Is the patient deaf or have difficulty hearing?: No Does the patient have difficulty seeing, even when wearing glasses/contacts?: No Does the patient have difficulty concentrating, remembering, or making decisions?: No  Permission Sought/Granted Permission sought to share information with : Facility Medical sales representative, Family Supports Permission granted to share information with : Yes, Verbal Permission Granted     Permission granted to share info w AGENCY: Home Health Agencies, DME companies  Permission granted to share info w Relationship: Daughter and daughter-in-law     Emotional Assessment Appearance:: Appears stated age Attitude/Demeanor/Rapport: Engaged, Gracious Affect (typically observed): Accepting, Appropriate,  Calm, Pleasant Orientation: : Oriented to Self, Oriented to Place, Oriented to  Time, Oriented to Situation Alcohol / Substance Use: Not Applicable Psych Involvement: No (comment)  Admission diagnosis:  SVT (supraventricular tachycardia) (HCC) [I47.10] COPD exacerbation  (HCC) [J44.1] Hemoptysis [R04.2] Patient Active Problem List   Diagnosis Date Noted   Hemoptysis 07/15/2023   CAP (community acquired pneumonia) 07/15/2023   Weakness of left upper extremity 01/01/2023   Generalized weakness 01/01/2023   History of loop recorder 03/02/2022   Palpitations    Elevated troponin    SVT (supraventricular tachycardia) (HCC)    PAF (paroxysmal atrial fibrillation) (HCC)    Chest pain 05/02/2021   Lumbar stenosis with neurogenic claudication 02/06/2021   Compression fracture of fifth lumbar vertebra (HCC) 12/20/2020   DDD (degenerative disc disease), thoracolumbar 11/22/2020   DDD (degenerative disc disease), thoracic 11/10/2020   TIA (transient ischemic attack) 10/12/2020   Left shoulder pain 10/12/2020   Chronic thoracic back pain (Left) 10/10/2020   T12 compression fracture, sequela 10/10/2020   Lumbar foraminal stenosis (Bilateral: L2-3, L3-4, L4-5) (Right: L5-S1) (Severe left L4-5) 09/08/2020   Grade 1 Lumbar Anterolisthesis (L3/4 and L4/5) 09/08/2020   Grade 1 Lumbar Retrolisthesis (L2/3) 09/08/2020   Protrusion of lumbar intervertebral disc (Multilevel) 09/08/2020   Compression fracture of L1 lumbar vertebra, sequela 09/08/2020   Other intervertebral disc degeneration, lumbar region 09/08/2020   Lumbosacral facet syndrome (Multilevel) (Bilateral) 09/08/2020   Lumbar facet hypertrophy (Multilevel) (Bilateral) 09/08/2020   Ligamentum flavum hypertrophy 09/08/2020   Lumbar lateral recess stenosis (Right: L2-3) 09/08/2020   DDD (degenerative disc disease), lumbosacral 09/08/2020   Compression fracture of L5 vertebra (HCC) 09/08/2020   Osteoporosis with pathological fracture of lumbar vertebra (HCC) 09/08/2020   Vitamin B12 deficiency 09/08/2020   Chronic pain syndrome 09/07/2020   Pharmacologic therapy 09/07/2020   Disorder of skeletal system 09/07/2020   Problems influencing health status 09/07/2020   Uncomplicated opioid dependence (HCC)  09/07/2020   Chronic low back pain (2ry area of Pain) (Bilateral) (R>L) w/ sciatica (Right) 09/07/2020   Chronic lower extremity pain (1ry area of Pain) (Right) 09/07/2020   Chronic lumbar radiculopathy (Right) 09/07/2020   Lumbosacral radiculopathy at L2 (Right) 09/07/2020   Lumbar radiculitis (L2 dermatome) (Right) 09/07/2020   Unspecified inflammatory spondylopathy, lumbar region (HCC) 09/07/2020   Chronic anticoagulation (Eliquis) 09/07/2020   Abnormal MRI, lumbar spine (04/20/2020) 09/07/2020   Intractable low back pain 08/03/2020   Fall down stairs 08/03/2020   Hemorrhagic stroke (HCC) 08/03/2020   Dizziness and giddiness 07/26/2020   Other chronic pain 07/26/2020   Other sequelae of cerebral infarction 07/18/2020   CVA (cerebral vascular accident) (HCC) 07/14/2020   Blurred vision 07/14/2020   Lumbar central spinal stenosis (Moderate: L2-3  Severe: L3-4, L4-5) w/ neurogenic claudication 05/31/2020   Muscle spasm 04/10/2020   Urinary tract infection with hematuria 04/10/2020   Headache disorder 12/21/2019   History of stroke 12/21/2019   Dizziness 11/06/2019   Atopic dermatitis 10/28/2019   History of CVA (cerebrovascular accident) 10/07/2019   Intractable episodic cluster headache 09/20/2019   Other symptoms and signs involving the nervous system 09/20/2019   Anxiety disorder, unspecified 08/21/2019   Dependence on supplemental oxygen 08/21/2019   Presence of artificial knee joint, bilateral 08/21/2019   Nicotine dependence, cigarettes, uncomplicated 08/21/2019   Iron deficiency anemia 05/17/2019   Vitamin D deficiency 05/17/2019   Flu vaccine need 05/17/2019   Encounter for general adult medical examination with abnormal findings 04/05/2019   Nausea and vomiting 04/05/2019  Dysuria 04/05/2019   Need for vaccination against Streptococcus pneumoniae using pneumococcal conjugate vaccine 7 04/05/2019   Occlusion and stenosis of bilateral carotid arteries 11/24/2017    Subarachnoid hemorrhage (HCC) 12/09/2016   Solitary pulmonary nodule 08/24/2016   Primary localized osteoarthritis of right knee 07/03/2016   Ileus (HCC) 04/24/2016   Colitis due to Clostridium difficile    Recurrent Clostridium difficile diarrhea    Enteritis due to Clostridium difficile 08/22/2014   Diarrhea 08/20/2014   Essential hypertension 08/20/2014   Hyperlipidemia 08/20/2014   Hypothyroidism 08/20/2014   Tobacco abuse 08/20/2014   COPD (chronic obstructive pulmonary disease) (HCC) 08/20/2014   Acute diarrhea 07/27/2014   PCP:  Mick Sell, MD Pharmacy:   [facility] - [place], [place] - [address] [place] [address] Phone: [phone] Fax: [phone]  [id number] - [place], [place] - [address] [place] [address] Phone: [phone] Fax: [phone]  [id number] - [place], [place] - [address] AT [facility] [address] [facility] [address] [place] [address] Phone: [phone] Fax: [phone]     Social Determinants of Health (SDOH) Social History: SDOH Screenings   Food Insecurity: No Food Insecurity (07/15/2023)  Housing: Low Risk  (07/15/2023)  Transportation Needs: No Transportation Needs (07/15/2023)  Utilities: Not At Risk (07/15/2023)  Alcohol Screen: Low Risk  (08/08/2020)  Depression (PHQ2-9): Low Risk  (04/10/2021)  Financial Resource Strain: Low Risk  (06/03/2023)   Received from [facility]  Tobacco Use: High Risk (06/19/2023)   SDOH Interventions:     Readmission Risk Interventions    07/17/2023   11:52 AM  Readmission Risk Prevention Plan  Transportation Screening Complete  PCP or Specialist Appt within 3-5 Days Complete  HRI or Home Care Consult Complete  Social Work Consult for Recovery Care Planning/Counseling Complete  Palliative Care Screening Not Applicable  Medication Review Furniture conservator/restorer) Complete

## 2023-07-17 NOTE — Progress Notes (Signed)
PROGRESS NOTE    [name] Douglas  WUJ:[id number] DOB: 1941/04/09 DOA: 07/15/2023 PCP: Mick Sell, MD    Brief Narrative:   82 year old with history of stroke on Pradaxa, P A-fib/SVT, recently diagnosed RUL lung small cell lung cancer status post radiation in September, COPD, HTN, DM2, CHF with preserved EF admitted for shortness of breath.  Initially found to be in SVT broke with adenosine.  CT chest negative for PE but possible lymphadenopathy from mets.  She also reported of hemoptysis.  Currently being treated for bronchitis/pneumonia with COPD exacerbation.  Hemoptysis appears to have subsided for now.  CT scan showing possible progression of malignancy therefore notified her oncology team.    Assessment & Plan:   Principal Problem:   Hemoptysis Active Problems:   COPD (chronic obstructive pulmonary disease) (HCC)   CAP (community acquired pneumonia)  Acute hypoxic respiratory failure Hemoptysis I suspect combination of COPD exacerbation/pneumonitis and worsening malignancy.  Hemoptysis from persistent coughing and being on Pradaxa.  CT scan does show concerning for pneumonia but possibly worsening of malignancy therefore I have notified Dr. Carmina Miller from radiation oncology to possibly repeat scans in the near future, he is planning of it in January.  Family aware   Acute COPD exacerbation with concerns of bronchitis/pneumonia Continue steroids and bronchodilators at this time.  Continue doxycycline x 5-day course   Paroxysmal SVT/PAF -Triggered by hypoxia and COPD exacerbation.  Initially in SVT requiring adenosine.  Continue home medication including flecainide and Pradaxa.   Elevated troponins EKG is nonischemic.  I suspect secondary to SVT and underlying pulmonary condition.  Echocardiogram ordered   Bilateral pleural effusion -Patient appears to be euvolemic, suspect pleural effusion might be malignant metastasis from RUL cancer.   AKI Baseline creatinine  0.9, admission 1.44.  Now resolved   RUL non-small cell lung cancer -Status post radiation therapy    DVT prophylaxis: Pradaxa Code Status: Full Family Communication: Family member at bedside 11/27 Disposition Plan: Status is: Inpatient Remains inpatient appropriate because: COPD exacerbation   Level of care: Telemetry Medical  Consultants:  None  Procedures:  None  Antimicrobials: None   Subjective: Seen and examined peer resting in bed.  No visible distress.  Appears frail.  Objective: Vitals:   07/16/23 2053 07/17/23 0345 07/17/23 0748 07/17/23 1335  BP: (!) 149/57 (!) 139/49 (!) 165/67   Pulse: 97 92 (!) 104   Resp: 18 18 20    Temp: 98.2 F (36.8 C) 98 F (36.7 C) 98 F (36.7 C)   TempSrc: Oral Oral    SpO2: 98% 97% 99% 97%  Weight:      Height:       No intake or output data in the 24 hours ending 07/17/23 1518 Filed Weights   07/15/23 0953  Weight: 90.8 kg    Examination:  General exam: NAD.  Appears frail Respiratory system: Clear to auscultation. Respiratory effort normal. Cardiovascular system: S1-S2, RRR, no murmurs, no pedal edema Gastrointestinal system: Thin, soft, NT/ND, normal bowel sounds Central nervous system: Alert and oriented. No focal neurological deficits. Extremities: Symmetric 5 x 5 power. Skin: No rashes, lesions or ulcers Psychiatry: Judgement and insight appear normal. Mood & affect appropriate.     Data Reviewed: I have personally reviewed following labs and imaging studies  CBC: Recent Labs  Lab 07/15/23 0852 07/16/23 0618 07/17/23 0411  WBC 10.9* 11.8* 12.1*  NEUTROABS 8.6*  --   --   HGB 10.1* 8.8* 9.1*  HCT 32.4* 27.2* 28.3*  MCV 91.0 90.7 90.7  PLT 338 275 306   Basic Metabolic Panel: Recent Labs  Lab 07/15/23 0852 07/16/23 0618 07/17/23 0411  NA 134* 137 137  K 4.2 4.5 4.6  CL 98 104 104  CO2 24 24 25   GLUCOSE 103* 129* 172*  BUN 24* 24* 30*  CREATININE 1.44* 1.00 0.94  CALCIUM 8.7* 8.7* 8.8*   MG 2.0  --  2.1  PHOS  --   --  2.7   GFR: Estimated Creatinine Clearance: 49.5 mL/min (by C-G formula based on SCr of 0.94 mg/dL). Liver Function Tests: Recent Labs  Lab 07/15/23 0852  AST 26  ALT 17  ALKPHOS 77  BILITOT 0.9  PROT 6.5  ALBUMIN 3.6   No results for input(s): "LIPASE", "AMYLASE" in the last 168 hours. No results for input(s): "AMMONIA" in the last 168 hours. Coagulation Profile: No results for input(s): "INR", "PROTIME" in the last 168 hours. Cardiac Enzymes: No results for input(s): "CKTOTAL", "CKMB", "CKMBINDEX", "TROPONINI" in the last 168 hours. BNP (last 3 results) No results for input(s): "PROBNP" in the last 8760 hours. HbA1C: No results for input(s): "HGBA1C" in the last 72 hours. CBG: No results for input(s): "GLUCAP" in the last 168 hours. Lipid Profile: No results for input(s): "CHOL", "HDL", "LDLCALC", "TRIG", "CHOLHDL", "LDLDIRECT" in the last 72 hours. Thyroid Function Tests: No results for input(s): "TSH", "T4TOTAL", "FREET4", "T3FREE", "THYROIDAB" in the last 72 hours. Anemia Panel: No results for input(s): "VITAMINB12", "FOLATE", "FERRITIN", "TIBC", "IRON", "RETICCTPCT" in the last 72 hours. Sepsis Labs: No results for input(s): "PROCALCITON", "LATICACIDVEN" in the last 168 hours.  No results found for this or any previous visit (from the past 240 hour(s)).       Radiology Studies: No results found.      Scheduled Meds:  amLODipine  5 mg Oral Daily   arformoterol  15 mcg Nebulization BID   vitamin C  500 mg Oral Daily   atorvastatin  80 mg Oral Q24H   budesonide (PULMICORT) nebulizer solution  0.5 mg Nebulization Q12H   vitamin B-12  500 mcg Oral Daily   dabigatran  150 mg Oral Q12H   donepezil  10 mg Oral QHS   doxycycline  100 mg Oral Q12H   flecainide  50 mg Oral BID   guaiFENesin  1,200 mg Oral BID   ipratropium  0.5 mg Nebulization Q6H   levothyroxine  88 mcg Oral Q0600   loratadine  10 mg Oral Daily   melatonin   2.5 mg Oral QHS   methylPREDNISolone (SOLU-MEDROL) injection  40 mg Intravenous Q12H   venlafaxine XR  150 mg Oral Q breakfast   Continuous Infusions:   LOS: 1 day   Tresa Moore, MD [facility]   If 7PM-7AM, please contact night-coverage  07/17/2023, 3:18 PM

## 2023-07-17 NOTE — TOC CM/SW Note (Signed)
Patient is not able to walk the distance required to go the bathroom, or he/she is unable to safely negotiate stairs required to access the bathroom.  A 3in1 BSC will alleviate this problem  

## 2023-07-17 NOTE — Telephone Encounter (Signed)
Received O2 order from [facility]. Gave to [employer]

## 2023-07-17 NOTE — Progress Notes (Signed)
Physical Therapy Treatment Patient Details Name: [name] Douglas MRN: [id number] DOB: 03/22/1941 Today's Date: 07/17/2023   History of Present Illness 82 y.o. female presented with worsening of productive cough, wheezing, SOB and coughing up streaks of blood. CT chest findings: no PE, possible lymphadenopathy suspicious for metastatic disease.  Bilateral small pleural effusion. PMHx: stroke, PAF/paroxysmal SVT, recently diagnosed RUL non-small cell carcinoma s/p radiation therapy x 3 weeks in Sept, COPD, HTN, IIDM, chronic HFpEF.    PT Comments  Pt is making good progress towards goals with ability to increase ambulation distance using RW. 4L of O2 used. Pt eager to sit in recliner at end of session. Will continue to progress as able.   If plan is discharge home, recommend the following: A little help with walking and/or transfers;Assist for transportation;Help with stairs or ramp for entrance   Can travel by private vehicle        Equipment Recommendations  None recommended by PT    Recommendations for Other Services       Precautions / Restrictions Precautions Precautions: Fall Restrictions Weight Bearing Restrictions: No     Mobility  Bed Mobility Overal bed mobility: Modified Independent             General bed mobility comments: safe technique with ease of effort getting to bed    Transfers Overall transfer level: Needs assistance Equipment used: Rolling walker (2 wheels) Transfers: Sit to/from Stand Sit to Stand: Supervision           General transfer comment: safe technique with RW. O2 increased to 4L for mobility    Ambulation/Gait Ambulation/Gait assistance: Contact guard assist Gait Distance (Feet): 200 Feet Assistive device: Rolling walker (2 wheels) Gait Pattern/deviations: Step-through pattern       General Gait Details: ambulated around RN station with RW and 4L of O2. Slight SOB symptoms noted with cues for pacing self.   Stairs              Wheelchair Mobility     Tilt Bed    Modified Rankin (Stroke Patients Only)       Balance Overall balance assessment: Needs assistance Sitting-balance support: No upper extremity supported, Feet supported Sitting balance-Leahy Scale: Good     Standing balance support: Bilateral upper extremity supported, During functional activity Standing balance-Leahy Scale: Good                              Cognition Arousal: Alert Behavior During Therapy: WFL for tasks assessed/performed Overall Cognitive Status: Within Functional Limits for tasks assessed                                 General Comments: pleasant and agreeable to session        Exercises      General Comments        Pertinent Vitals/Pain Pain Assessment Pain Assessment: No/denies pain    Home Living                          Prior Function            PT Goals (current goals can now be found in the care plan section) Acute Rehab PT Goals Patient Stated Goal: To go home PT Goal Formulation: With patient Time For Goal Achievement: 07/30/23 Potential to Achieve Goals: Good Progress towards PT  goals: Progressing toward goals    Frequency    Min 1X/week      PT Plan      Co-evaluation              AM-PAC PT "6 Clicks" Mobility   Outcome Measure  Help needed turning from your back to your side while in a flat bed without using bedrails?: None Help needed moving from lying on your back to sitting on the side of a flat bed without using bedrails?: None Help needed moving to and from a bed to a chair (including a wheelchair)?: A Little Help needed standing up from a chair using your arms (e.g., wheelchair or bedside chair)?: A Little Help needed to walk in hospital room?: A Little Help needed climbing 3-5 steps with a railing? : A Little 6 Click Score: 20    End of Session Equipment Utilized During Treatment: Gait belt;Oxygen Activity  Tolerance: Patient tolerated treatment well;Patient limited by fatigue Patient left: in chair;with chair alarm set Nurse Communication: Mobility status PT Visit Diagnosis: Unsteadiness on feet (R26.81);Other abnormalities of gait and mobility (R26.89);Difficulty in walking, not elsewhere classified (R26.2);Muscle weakness (generalized) (M62.81)     Time: 4782-9562 PT Time Calculation (min) (ACUTE ONLY): 17 min  Charges:    $Gait Training: 8-22 mins PT General Charges $$ ACUTE PT VISIT: 1 Visit                     Elizabeth Palau, PT, DPT, GCS [phone]    [name] Douglas 07/17/2023, 2:03 PM

## 2023-07-17 NOTE — Progress Notes (Signed)
Mobility Specialist - Progress Note   Pre-mobility: SpO2(92) During mobility: SpO2 (83) Post-mobility: SPO2(93)     07/17/23 1233  Mobility  Activity Ambulated with assistance in hallway;Stood at bedside;Dangled on edge of bed  Level of Assistance Standby assist, set-up cues, supervision of patient - no hands on  Assistive Device Front wheel walker  Distance Ambulated (ft) 220 ft  Range of Motion/Exercises Active  Activity Response Tolerated well  Mobility Referral Yes  $Mobility charge 1 Mobility  Mobility Specialist Start Time (ACUTE ONLY) 1205  Mobility Specialist Stop Time (ACUTE ONLY) 1233  Mobility Specialist Time Calculation (min) (ACUTE ONLY) 28 min   Pt resting in recliner upon entry on 3L. Pt STS and ambulates to hallway around NS SBA with RW. Pt desat to 83 after 184ft and returned to recliner and left with needs in reach. Pt took 2-3 minutes to recover breath. Pt SpO2 increased to 93. Pt chair alarm activated. Pt family members at bedside.   Johnathan Hausen Mobility Specialist 07/17/23, 5:35 PM

## 2023-07-18 ENCOUNTER — Encounter: Payer: Self-pay | Admitting: Internal Medicine

## 2023-07-18 DIAGNOSIS — R042 Hemoptysis: Secondary | ICD-10-CM | POA: Diagnosis not present

## 2023-07-18 LAB — CBC
HCT: 30.7 % — ABNORMAL LOW (ref 36.0–46.0)
Hemoglobin: 9.7 g/dL — ABNORMAL LOW (ref 12.0–15.0)
MCH: 29.2 pg (ref 26.0–34.0)
MCHC: 31.6 g/dL (ref 30.0–36.0)
MCV: 92.5 fL (ref 80.0–100.0)
Platelets: 322 10*3/uL (ref 150–400)
RBC: 3.32 MIL/uL — ABNORMAL LOW (ref 3.87–5.11)
RDW: 15 % (ref 11.5–15.5)
WBC: 12.6 10*3/uL — ABNORMAL HIGH (ref 4.0–10.5)
nRBC: 0 % (ref 0.0–0.2)

## 2023-07-18 LAB — BASIC METABOLIC PANEL
Anion gap: 9 (ref 5–15)
BUN: 26 mg/dL — ABNORMAL HIGH (ref 8–23)
CO2: 27 mmol/L (ref 22–32)
Calcium: 9.1 mg/dL (ref 8.9–10.3)
Chloride: 104 mmol/L (ref 98–111)
Creatinine, Ser: 0.81 mg/dL (ref 0.44–1.00)
GFR, Estimated: >60 mL/min (ref >60)
Glucose, Bld: 124 mg/dL — ABNORMAL HIGH (ref 70–99)
Potassium: 4.8 mmol/L (ref 3.5–5.1)
Sodium: 140 mmol/L (ref 135–145)

## 2023-07-18 LAB — MAGNESIUM: Magnesium: 2.4 mg/dL (ref 1.7–2.4)

## 2023-07-18 MED ORDER — METHYLPREDNISOLONE SODIUM SUCC 40 MG IJ SOLR
40.0000 mg | Freq: Every day | INTRAMUSCULAR | Status: DC
  Administered 2023-07-19 – 2023-07-20 (×2): 40 mg via INTRAVENOUS
  Filled 2023-07-18 (×2): qty 1

## 2023-07-18 NOTE — Progress Notes (Signed)
PROGRESS NOTE    [name] Douglas  UUV:[id number] DOB: [birth year] DOA: 07/15/2023 PCP: Mick Sell, MD    Brief Narrative:   82 year old with history of stroke on Pradaxa, P A-fib/SVT, recently diagnosed RUL lung small cell lung cancer status post radiation in September, COPD, HTN, DM2, CHF with preserved EF admitted for shortness of breath.  Initially found to be in SVT broke with adenosine.  CT chest negative for PE but possible lymphadenopathy from mets.  She also reported of hemoptysis.  Currently being treated for bronchitis/pneumonia with COPD exacerbation.  Hemoptysis appears to have subsided for now.  CT scan showing possible progression of malignancy therefore notified her oncology team.    Assessment & Plan:   Principal Problem:   Hemoptysis Active Problems:   COPD (chronic obstructive pulmonary disease) (HCC)   CAP (community acquired pneumonia)  Acute hypoxic respiratory failure Hemoptysis I suspect combination of COPD exacerbation/pneumonitis and worsening malignancy.  Hemoptysis from persistent coughing and being on Pradaxa.  CT scan does show concerning for pneumonia but possibly worsening of malignancy therefore I have notified Dr. Carmina Miller from radiation oncology to possibly repeat scans in the near future, he is planning of it in January.  Family aware   Acute COPD exacerbation with concerns of bronchitis/pneumonia Continue steroids and bronchodilators at this time.   Decrease Solu-Medrol 40 mg once daily Continue doxycycline x 5-day course   Paroxysmal SVT/PAF -Triggered by hypoxia and COPD exacerbation.  Initially in SVT requiring adenosine.  Continue home medication including flecainide and Pradaxa.   Elevated troponins EKG is nonischemic.  I suspect secondary to SVT and underlying pulmonary condition.  Echocardiogram ordered   Bilateral pleural effusion -Patient appears to be euvolemic, suspect pleural effusion might be malignant metastasis from  RUL cancer.   AKI Baseline creatinine 0.9, admission 1.44.  Now resolved   RUL non-small cell lung cancer -Status post radiation therapy    DVT prophylaxis: Pradaxa Code Status: Full Family Communication: daughter Carollee Herter [phone] via phone 11/27 Disposition Plan: Status is: Inpatient Remains inpatient appropriate because: COPD exacerbation   Level of care: Telemetry Medical  Consultants:  None  Procedures:  None  Antimicrobials: None   Subjective: Seen and examined.  Resting in bed.  No visible distress.  Answer questions appropriately.  Objective: Vitals:   07/17/23 2236 07/18/23 0251 07/18/23 0741 07/18/23 0839  BP: (!) 142/56 127/64  (!) 127/50  Pulse: 96 93  86  Resp: 18 18  19   Temp: 98.1 F (36.7 C) 98.5 F (36.9 C)  99 F (37.2 C)  TempSrc: Oral Oral  Oral  SpO2: 95% 91% 97% 98%  Weight:      Height:       No intake or output data in the 24 hours ending 07/18/23 1147 Filed Weights   07/15/23 0953  Weight: 90.8 kg    Examination:  General exam: No acute distress.  Appears in good spirits Respiratory system: Bibasilar crackles.  Normal work of breathing.  3 L Cardiovascular system: S1-S2, RRR, no murmurs, no pedal edema Gastrointestinal system: Thin, soft, NT/ND, normal bowel sounds Central nervous system: Alert and oriented. No focal neurological deficits. Extremities: Symmetric 5 x 5 power. Skin: No rashes, lesions or ulcers Psychiatry: Judgement and insight appear normal. Mood & affect appropriate.     Data Reviewed: I have personally reviewed following labs and imaging studies  CBC: Recent Labs  Lab 07/15/23 0852 07/16/23 0618 07/17/23 0411 07/18/23 0416  WBC 10.9* 11.8* 12.1* 12.6*  NEUTROABS  8.6*  --   --   --   HGB 10.1* 8.8* 9.1* 9.7*  HCT 32.4* 27.2* 28.3* 30.7*  MCV 91.0 90.7 90.7 92.5  PLT 338 275 306 322   Basic Metabolic Panel: Recent Labs  Lab 07/15/23 0852 07/16/23 0618 07/17/23 0411 07/18/23 0416  NA  134* 137 137 140  K 4.2 4.5 4.6 4.8  CL 98 104 104 104  CO2 24 24 25 27   GLUCOSE 103* 129* 172* 124*  BUN 24* 24* 30* 26*  CREATININE 1.44* 1.00 0.94 0.81  CALCIUM 8.7* 8.7* 8.8* 9.1  MG 2.0  --  2.1 2.4  PHOS  --   --  2.7  --    GFR: Estimated Creatinine Clearance: 57.4 mL/min (by C-G formula based on SCr of 0.81 mg/dL). Liver Function Tests: Recent Labs  Lab 07/15/23 0852  AST 26  ALT 17  ALKPHOS 77  BILITOT 0.9  PROT 6.5  ALBUMIN 3.6   No results for input(s): "LIPASE", "AMYLASE" in the last 168 hours. No results for input(s): "AMMONIA" in the last 168 hours. Coagulation Profile: No results for input(s): "INR", "PROTIME" in the last 168 hours. Cardiac Enzymes: No results for input(s): "CKTOTAL", "CKMB", "CKMBINDEX", "TROPONINI" in the last 168 hours. BNP (last 3 results) No results for input(s): "PROBNP" in the last 8760 hours. HbA1C: No results for input(s): "HGBA1C" in the last 72 hours. CBG: No results for input(s): "GLUCAP" in the last 168 hours. Lipid Profile: No results for input(s): "CHOL", "HDL", "LDLCALC", "TRIG", "CHOLHDL", "LDLDIRECT" in the last 72 hours. Thyroid Function Tests: No results for input(s): "TSH", "T4TOTAL", "FREET4", "T3FREE", "THYROIDAB" in the last 72 hours. Anemia Panel: No results for input(s): "VITAMINB12", "FOLATE", "FERRITIN", "TIBC", "IRON", "RETICCTPCT" in the last 72 hours. Sepsis Labs: No results for input(s): "PROCALCITON", "LATICACIDVEN" in the last 168 hours.  No results found for this or any previous visit (from the past 240 hour(s)).       Radiology Studies: No results found.      Scheduled Meds:  amLODipine  5 mg Oral Daily   arformoterol  15 mcg Nebulization BID   vitamin C  500 mg Oral Daily   atorvastatin  80 mg Oral Q24H   budesonide (PULMICORT) nebulizer solution  0.5 mg Nebulization Q12H   vitamin B-12  500 mcg Oral Daily   dabigatran  150 mg Oral Q12H   donepezil  10 mg Oral QHS   doxycycline  100  mg Oral Q12H   flecainide  50 mg Oral BID   guaiFENesin  1,200 mg Oral BID   levothyroxine  88 mcg Oral Q0600   loratadine  10 mg Oral Daily   melatonin  5 mg Oral QHS   methylPREDNISolone (SOLU-MEDROL) injection  40 mg Intravenous Q12H   venlafaxine XR  150 mg Oral Q breakfast   Continuous Infusions:   LOS: 2 days   Tresa Moore, MD [facility]   If 7PM-7AM, please contact night-coverage  07/18/2023, 11:47 AM

## 2023-07-19 DIAGNOSIS — R042 Hemoptysis: Secondary | ICD-10-CM | POA: Diagnosis not present

## 2023-07-19 LAB — CBC
HCT: 34.2 % — ABNORMAL LOW (ref 36.0–46.0)
Hemoglobin: 10.7 g/dL — ABNORMAL LOW (ref 12.0–15.0)
MCH: 28.8 pg (ref 26.0–34.0)
MCHC: 31.3 g/dL (ref 30.0–36.0)
MCV: 92.2 fL (ref 80.0–100.0)
Platelets: 353 10*3/uL (ref 150–400)
RBC: 3.71 MIL/uL — ABNORMAL LOW (ref 3.87–5.11)
RDW: 14.8 % (ref 11.5–15.5)
WBC: 14 10*3/uL — ABNORMAL HIGH (ref 4.0–10.5)
nRBC: 0 % (ref 0.0–0.2)

## 2023-07-19 LAB — BASIC METABOLIC PANEL
Anion gap: 9 (ref 5–15)
BUN: 27 mg/dL — ABNORMAL HIGH (ref 8–23)
CO2: 31 mmol/L (ref 22–32)
Calcium: 9 mg/dL (ref 8.9–10.3)
Chloride: 102 mmol/L (ref 98–111)
Creatinine, Ser: 0.93 mg/dL (ref 0.44–1.00)
GFR, Estimated: >60 mL/min (ref >60)
Glucose, Bld: 94 mg/dL (ref 70–99)
Potassium: 5.1 mmol/L (ref 3.5–5.1)
Sodium: 142 mmol/L (ref 135–145)

## 2023-07-19 LAB — MAGNESIUM: Magnesium: 2.2 mg/dL (ref 1.7–2.4)

## 2023-07-19 MED ORDER — LEVALBUTEROL HCL 0.63 MG/3ML IN NEBU
0.6300 mg | INHALATION_SOLUTION | Freq: Four times a day (QID) | RESPIRATORY_TRACT | Status: DC
  Administered 2023-07-19 – 2023-07-20 (×4): 0.63 mg via RESPIRATORY_TRACT
  Filled 2023-07-19 (×5): qty 3

## 2023-07-19 NOTE — Progress Notes (Signed)
Mobility Specialist - Progress Note     07/19/23 1623  Mobility  Activity Ambulated with assistance in hallway;Stood at bedside  Level of Assistance Standby assist, set-up cues, supervision of patient - no hands on  Assistive Device Front wheel walker  Distance Ambulated (ft) 320 ft  Range of Motion/Exercises Active  Activity Response Tolerated well  Mobility Referral Yes  $Mobility charge 1 Mobility   Pt resting in bed on 4L upon entry. Pt STS and ambulates to hallway around NS SBA with rolling walker. Pt maintained above 90% SpO2 during ambulation. Pt returned to bed and left with needs in reach.   Johnathan Hausen Mobility Specialist 07/19/23, 4:30 PM

## 2023-07-19 NOTE — Progress Notes (Signed)
Occupational Therapy Treatment Patient Details Name: [name] Douglas MRN: [id number] DOB: 08-Dec-1940 Today's Date: 07/19/2023   History of present illness 82 y.o. female presented with worsening of productive cough, wheezing, SOB and coughing up streaks of blood. CT chest findings: no PE, possible lymphadenopathy suspicious for metastatic disease.  Bilateral small pleural effusion. PMHx: stroke, PAF/paroxysmal SVT, recently diagnosed RUL non-small cell carcinoma s/p radiation therapy x 3 weeks in Sept, COPD, HTN, IIDM, chronic HFpEF.   OT comments  Pt is supine in bed on arrival. Pleasant and agreeable to OT session. She denies pain. Pt performed bed mobility MOD I, STS from EOB to RW and mobility to the bathroom and back using RW with SUP. Toilet transfer and all bathing performed in bathroom seated/standing with SUP, no LOB and intermittent periods of not using RW/UE support. Sp02 88% at lowest on 4L with HR of 105. Pt returned to bed with all needs in place and will cont to require skilled acute OT services to maximize her safety, activity tolerance, and IND to return to PLOF.       If plan is discharge home, recommend the following:  A little help with walking and/or transfers;A little help with bathing/dressing/bathroom;Assistance with cooking/housework;Assist for transportation;Help with stairs or ramp for entrance   Equipment Recommendations  None recommended by OT    Recommendations for Other Services      Precautions / Restrictions Precautions Precautions: Fall Restrictions Weight Bearing Restrictions: No       Mobility Bed Mobility Overal bed mobility: Modified Independent                  Transfers Overall transfer level: Needs assistance Equipment used: Rolling walker (2 wheels) Transfers: Sit to/from Stand Sit to Stand: Supervision                 Balance Overall balance assessment: Needs assistance Sitting-balance support: No upper extremity  supported, Feet supported Sitting balance-Leahy Scale: Good     Standing balance support: Bilateral upper extremity supported, During functional activity Standing balance-Leahy Scale: Good Standing balance comment: no LOB during all dynamic standing ADLs with SUP                           ADL either performed or assessed with clinical judgement   ADL Overall ADL's : Needs assistance/impaired     Grooming: Wash/dry hands;Standing;Supervision/safety;Wash/dry face;Sitting   Upper Body Bathing: Sitting;Set up   Lower Body Bathing: Supervison/ safety;Sit to/from stand;Sitting/lateral leans   Upper Body Dressing : Supervision/safety;Standing   Lower Body Dressing: Sit to/from stand;Sitting/lateral leans;Supervision/safety   Toilet Transfer: Supervision/safety;Rolling walker (2 wheels);Grab Pharmacist, community and Hygiene: Sitting/lateral lean;Sit to/from stand;Supervision/safety         General ADL Comments: SUP for all ADLs performed during session as well as mobility from bed <> bathroom with RW on 4L    Extremity/Trunk Assessment Upper Extremity Assessment Upper Extremity Assessment: Overall WFL for tasks assessed   Lower Extremity Assessment Lower Extremity Assessment: Generalized weakness        Vision       Perception     Praxis      Cognition Arousal: Alert Behavior During Therapy: WFL for tasks assessed/performed Overall Cognitive Status: Within Functional Limits for tasks assessed  General Comments: pleasant and agreeable to session        Exercises      Shoulder Instructions       General Comments 4L 02 throughout dropped to 88% lowest with activity during session    Pertinent Vitals/ Pain       Pain Assessment Pain Assessment: No/denies pain  Home Living                                          Prior Functioning/Environment               Frequency  Min 1X/week        Progress Toward Goals  OT Goals(current goals can now be found in the care plan section)  Progress towards OT goals: Progressing toward goals  Acute Rehab OT Goals Patient Stated Goal: return home OT Goal Formulation: With patient/family Time For Goal Achievement: 07/30/23 Potential to Achieve Goals: Good  Plan      Co-evaluation                 AM-PAC OT "6 Clicks" Daily Activity     Outcome Measure   Help from another person eating meals?: None Help from another person taking care of personal grooming?: None Help from another person toileting, which includes using toliet, bedpan, or urinal?: None Help from another person bathing (including washing, rinsing, drying)?: A Little Help from another person to put on and taking off regular upper body clothing?: None Help from another person to put on and taking off regular lower body clothing?: A Little 6 Click Score: 22    End of Session Equipment Utilized During Treatment: Rolling walker (2 wheels);Oxygen  OT Visit Diagnosis: Other abnormalities of gait and mobility (R26.89)   Activity Tolerance Patient tolerated treatment well;Patient limited by fatigue   Patient Left in chair;with call bell/phone within reach;with chair alarm set;with family/visitor present   Nurse Communication          Time: 1324-4010 OT Time Calculation (min): 24 min  Charges: OT General Charges $OT Visit: 1 Visit OT Treatments $Self Care/Home Management : 23-37 mins  Patricio Popwell, OTR/L  07/19/23, 3:45 PM   Carrissa Taitano E Briggette Najarian 07/19/2023, 3:43 PM

## 2023-07-19 NOTE — Progress Notes (Signed)
Physical Therapy Treatment Patient Details Name: [name] Douglas MRN: [id number] DOB: 25-May-1941 Today's Date: 07/19/2023   History of Present Illness 82 y.o. female presented with worsening of productive cough, wheezing, SOB and coughing up streaks of blood. CT chest findings: no PE, possible lymphadenopathy suspicious for metastatic disease.  Bilateral small pleural effusion. PMHx: stroke, PAF/paroxysmal SVT, recently diagnosed RUL non-small cell carcinoma s/p radiation therapy x 3 weeks in Sept, COPD, HTN, IIDM, chronic HFpEF.    PT Comments  Pt was long sitting in bed upon arrival. She is A and O x 4. She remained on 4 L o2 throughout session. Sao2 dropped to its lowest at 89% during ambulation. Pt was easily able to exit L side of bed, stand to RW, and ambulate 2 laps ~ 200 ft with RW. SOB is noted but with seated rest quickly resolves. Pt is progressing well overall. Uses rollator at home at baseline per pt. No equipment needs identified. Dc recs remain appropriate.    If plan is discharge home, recommend the following: A little help with walking and/or transfers;Assist for transportation;Help with stairs or ramp for entrance     Equipment Recommendations  None recommended by PT       Precautions / Restrictions Precautions Precautions: Fall Restrictions Weight Bearing Restrictions: No     Mobility  Bed Mobility Overal bed mobility: Modified Independent   Transfers Overall transfer level: Needs assistance Equipment used: Rolling walker (2 wheels) Transfers: Sit to/from Stand Sit to Stand: Supervision  General transfer comment: pt demonstrated safe abilities to stand from lowest bed height    Ambulation/Gait Ambulation/Gait assistance: Supervision Gait Distance (Feet): 200 Feet Assistive device: Rolling walker (2 wheels) Gait Pattern/deviations: Step-through pattern Gait velocity: Decreased  General Gait Details: Ambulated around RN station 2 x with RW +  4L of O2. Slight  SOB symptoms noted but pt endorses having breathing deficits at baseline. Sao2 > 88% but on 4 L not 3 L baseline o2   Balance Overall balance assessment: Needs assistance Sitting-balance support: No upper extremity supported, Feet supported Sitting balance-Leahy Scale: Good     Standing balance support: Bilateral upper extremity supported, During functional activity Standing balance-Leahy Scale: Good     Cognition Arousal: Alert Behavior During Therapy: WFL for tasks assessed/performed Overall Cognitive Status: Within Functional Limits for tasks assessed  General Comments: pleasant and agreeable to session               Pertinent Vitals/Pain Pain Assessment Pain Assessment: No/denies pain     PT Goals (current goals can now be found in the care plan section) Acute Rehab PT Goals Patient Stated Goal: To go home ASAP Progress towards PT goals: Progressing toward goals    Frequency    Min 1X/week       AM-PAC PT "6 Clicks" Mobility   Outcome Measure  Help needed turning from your back to your side while in a flat bed without using bedrails?: None Help needed moving from lying on your back to sitting on the side of a flat bed without using bedrails?: None Help needed moving to and from a bed to a chair (including a wheelchair)?: A Little Help needed standing up from a chair using your arms (e.g., wheelchair or bedside chair)?: A Little Help needed to walk in hospital room?: A Little Help needed climbing 3-5 steps with a railing? : A Little 6 Click Score: 20    End of Session Equipment Utilized During Treatment: Oxygen (4 L) Activity Tolerance:  Patient tolerated treatment well;Patient limited by fatigue Patient left: in bed;with call bell/phone within reach;with bed alarm set Nurse Communication: Mobility status PT Visit Diagnosis: Unsteadiness on feet (R26.81);Other abnormalities of gait and mobility (R26.89);Difficulty in walking, not elsewhere classified  (R26.2);Muscle weakness (generalized) (M62.81)     Time: 5409-8119 PT Time Calculation (min) (ACUTE ONLY): 12 min  Charges:    $Gait Training: 8-22 mins PT General Charges $$ ACUTE PT VISIT: 1 Visit                    Jetta Lout PTA 07/19/23, 12:01 PM

## 2023-07-19 NOTE — Plan of Care (Signed)
Patient A/O X4, on continous 3L [place] with a non- Problem: Education: Goal: Knowledge of General Education information will improve Description: Including pain rating scale, medication(s)/side effects and non-pharmacologic comfort measures Outcome: Progressing   Problem: Health Behavior/Discharge Planning: Goal: Ability to manage health-related needs will improve Outcome: Progressing   Problem: Clinical Measurements: Goal: Ability to maintain clinical measurements within normal limits will improve Outcome: Progressing Goal: Will remain free from infection Outcome: Progressing Goal: Diagnostic test results will improve Outcome: Progressing Goal: Respiratory complications will improve Outcome: Progressing Goal: Cardiovascular complication will be avoided Outcome: Progressing   Problem: Activity: Goal: Risk for activity intolerance will decrease Outcome: Progressing   Problem: Nutrition: Goal: Adequate nutrition will be maintained Outcome: Progressing   Problem: Coping: Goal: Level of anxiety will decrease Outcome: Progressing   Problem: Elimination: Goal: Will not experience complications related to bowel motility Outcome: Progressing Goal: Will not experience complications related to urinary retention Outcome: Progressing   Problem: Pain Management: Goal: General experience of comfort will improve Outcome: Progressing   Problem: Safety: Goal: Ability to remain free from injury will improve Outcome: Progressing   Problem: Skin Integrity: Goal: Risk for impaired skin integrity will decrease Outcome: Progressing   Problem: Education: Goal: Knowledge of disease or condition will improve Outcome: Progressing Goal: Knowledge of the prescribed therapeutic regimen will improve Outcome: Progressing Goal: Individualized Educational Video(s) Outcome: Progressing   Problem: Activity: Goal: Ability to tolerate increased activity will improve Outcome: Progressing Goal:  Will verbalize the importance of balancing activity with adequate rest periods Outcome: Progressing   Problem: Respiratory: Goal: Ability to maintain a clear airway will improve Outcome: Progressing Goal: Levels of oxygenation will improve Outcome: Progressing Goal: Ability to maintain adequate ventilation will improve Outcome: Progressing  product cough. Received scheduled breathing treatment, without pain 0/10 throughout night.

## 2023-07-19 NOTE — Progress Notes (Signed)
PROGRESS NOTE    [name] Douglas  ZOX:[id number] DOB: 1941-03-19 DOA: 07/15/2023 PCP: Mick Sell, MD    Brief Narrative:   82 year old with history of stroke on Pradaxa, P A-fib/SVT, recently diagnosed RUL lung small cell lung cancer status post radiation in September, COPD, HTN, DM2, CHF with preserved EF admitted for shortness of breath.  Initially found to be in SVT broke with adenosine.  CT chest negative for PE but possible lymphadenopathy from mets.  She also reported of hemoptysis.  Currently being treated for bronchitis/pneumonia with COPD exacerbation.  Hemoptysis appears to have subsided for now.  CT scan showing possible progression of malignancy therefore notified her oncology team.    Assessment & Plan:   Principal Problem:   Hemoptysis Active Problems:   COPD (chronic obstructive pulmonary disease) (HCC)   CAP (community acquired pneumonia)  Acute hypoxic respiratory failure Hemoptysis I suspect combination of COPD exacerbation/pneumonitis and worsening malignancy.  Hemoptysis from persistent coughing and being on Pradaxa.  CT scan does show concerning for pneumonia but possibly worsening of malignancy therefore I have notified Dr. Carmina Miller from radiation oncology to possibly repeat scans in the near future, he is planning of it in January.  Family aware and will follow up with rad onc post dc   Acute COPD exacerbation with concerns of bronchitis/pneumonia Continue IV steroids for today.  Consider de-escalation to p.o. prednisone tomorrow Continue bronchodilators Continue doxycycline, complete 5-day course   Paroxysmal SVT/PAF -Triggered by hypoxia and COPD exacerbation.  Initially in SVT requiring adenosine.  Continue home medication including flecainide and Pradaxa.   Elevated troponins EKG is nonischemic.  I suspect secondary to SVT and underlying pulmonary condition.  Echocardiogram ordered   Bilateral pleural effusion -Patient appears to be  euvolemic, suspect pleural effusion might be malignant metastasis from RUL cancer.   AKI Baseline creatinine 0.9, admission 1.44.  Now resolved   RUL non-small cell lung cancer -Status post radiation therapy    DVT prophylaxis: Pradaxa Code Status: Full Family Communication: daughter Carollee Herter [phone] via phone 11/27, 11/29 Disposition Plan: Status is: Inpatient Remains inpatient appropriate because: COPD exacerbation   Level of care: Telemetry Medical  Consultants:  None  Procedures:  None  Antimicrobials: None   Subjective: Seen and examined.  Resting in bed.  No visible distress.  Answer questions appropriately.  Objective: Vitals:   07/18/23 2133 07/19/23 0438 07/19/23 0624 07/19/23 0912  BP: (!) 159/63 (!) 119/51  (!) 154/58  Pulse: (!) 108 77  72  Resp: 18 (!) 22  18  Temp: 98.1 F (36.7 C) 98.2 F (36.8 C)  98.4 F (36.9 C)  TempSrc: Axillary Oral    SpO2: 91% 92% 94% 99%  Weight:      Height:        Intake/Output Summary (Last 24 hours) at 07/19/2023 1233 Last data filed at 07/19/2023 0523 Gross per 24 hour  Intake --  Output 500 ml  Net -500 ml   Filed Weights   07/15/23 0953  Weight: 90.8 kg    Examination:  General exam: No acute distress.  Appears in good spirits Respiratory system: Diffuse wheezing.  Normal work of breathing.  3 L Cardiovascular system: S1-S2, RRR, no murmurs, no pedal edema Gastrointestinal system: Thin, soft, NT/ND, normal bowel sounds Central nervous system: Alert and oriented. No focal neurological deficits. Extremities: Symmetric 5 x 5 power. Skin: No rashes, lesions or ulcers Psychiatry: Judgement and insight appear normal. Mood & affect appropriate.     Data  Reviewed: I have personally reviewed following labs and imaging studies  CBC: Recent Labs  Lab 07/15/23 0852 07/16/23 0618 07/17/23 0411 07/18/23 0416 07/19/23 0623  WBC 10.9* 11.8* 12.1* 12.6* 14.0*  NEUTROABS 8.6*  --   --   --   --    HGB 10.1* 8.8* 9.1* 9.7* 10.7*  HCT 32.4* 27.2* 28.3* 30.7* 34.2*  MCV 91.0 90.7 90.7 92.5 92.2  PLT 338 275 306 322 353   Basic Metabolic Panel: Recent Labs  Lab 07/15/23 0852 07/16/23 0618 07/17/23 0411 07/18/23 0416 07/19/23 0623  NA 134* 137 137 140 142  K 4.2 4.5 4.6 4.8 5.1  CL 98 104 104 104 102  CO2 24 24 25 27 31   GLUCOSE 103* 129* 172* 124* 94  BUN 24* 24* 30* 26* 27*  CREATININE 1.44* 1.00 0.94 0.81 0.93  CALCIUM 8.7* 8.7* 8.8* 9.1 9.0  MG 2.0  --  2.1 2.4 2.2  PHOS  --   --  2.7  --   --    GFR: Estimated Creatinine Clearance: 50 mL/min (by C-G formula based on SCr of 0.93 mg/dL). Liver Function Tests: Recent Labs  Lab 07/15/23 0852  AST 26  ALT 17  ALKPHOS 77  BILITOT 0.9  PROT 6.5  ALBUMIN 3.6   No results for input(s): "LIPASE", "AMYLASE" in the last 168 hours. No results for input(s): "AMMONIA" in the last 168 hours. Coagulation Profile: No results for input(s): "INR", "PROTIME" in the last 168 hours. Cardiac Enzymes: No results for input(s): "CKTOTAL", "CKMB", "CKMBINDEX", "TROPONINI" in the last 168 hours. BNP (last 3 results) No results for input(s): "PROBNP" in the last 8760 hours. HbA1C: No results for input(s): "HGBA1C" in the last 72 hours. CBG: No results for input(s): "GLUCAP" in the last 168 hours. Lipid Profile: No results for input(s): "CHOL", "HDL", "LDLCALC", "TRIG", "CHOLHDL", "LDLDIRECT" in the last 72 hours. Thyroid Function Tests: No results for input(s): "TSH", "T4TOTAL", "FREET4", "T3FREE", "THYROIDAB" in the last 72 hours. Anemia Panel: No results for input(s): "VITAMINB12", "FOLATE", "FERRITIN", "TIBC", "IRON", "RETICCTPCT" in the last 72 hours. Sepsis Labs: No results for input(s): "PROCALCITON", "LATICACIDVEN" in the last 168 hours.  No results found for this or any previous visit (from the past 240 hour(s)).       Radiology Studies: No results found.      Scheduled Meds:  amLODipine  5 mg Oral Daily    arformoterol  15 mcg Nebulization BID   vitamin C  500 mg Oral Daily   atorvastatin  80 mg Oral Q24H   vitamin B-12  500 mcg Oral Daily   dabigatran  150 mg Oral Q12H   donepezil  10 mg Oral QHS   doxycycline  100 mg Oral Q12H   flecainide  50 mg Oral BID   guaiFENesin  1,200 mg Oral BID   levothyroxine  88 mcg Oral Q0600   loratadine  10 mg Oral Daily   melatonin  5 mg Oral QHS   methylPREDNISolone (SOLU-MEDROL) injection  40 mg Intravenous Daily   venlafaxine XR  150 mg Oral Q breakfast   Continuous Infusions:   LOS: 3 days   Tresa Moore, MD [facility]   If 7PM-7AM, please contact night-coverage  07/19/2023, 12:33 PM

## 2023-07-20 DIAGNOSIS — R042 Hemoptysis: Secondary | ICD-10-CM | POA: Diagnosis not present

## 2023-07-20 LAB — BASIC METABOLIC PANEL
Anion gap: 9 (ref 5–15)
BUN: 35 mg/dL — ABNORMAL HIGH (ref 8–23)
CO2: 28 mmol/L (ref 22–32)
Calcium: 8.7 mg/dL — ABNORMAL LOW (ref 8.9–10.3)
Chloride: 100 mmol/L (ref 98–111)
Creatinine, Ser: 1 mg/dL (ref 0.44–1.00)
GFR, Estimated: 56 mL/min — ABNORMAL LOW (ref >60)
Glucose, Bld: 125 mg/dL — ABNORMAL HIGH (ref 70–99)
Potassium: 4.2 mmol/L (ref 3.5–5.1)
Sodium: 137 mmol/L (ref 135–145)

## 2023-07-20 LAB — BLOOD GAS, VENOUS
Acid-base deficit: 0.6 mmol/L (ref 0.0–2.0)
Bicarbonate: 24.9 mmol/L (ref 20.0–28.0)
O2 Saturation: 34.5 %
Patient temperature: 37
pCO2, Ven: 43 mm[Hg] — ABNORMAL LOW (ref 44–60)
pH, Ven: 7.37 (ref 7.25–7.43)

## 2023-07-20 LAB — MAGNESIUM: Magnesium: 2.4 mg/dL (ref 1.7–2.4)

## 2023-07-20 LAB — CBC
HCT: 31.8 % — ABNORMAL LOW (ref 36.0–46.0)
Hemoglobin: 10.2 g/dL — ABNORMAL LOW (ref 12.0–15.0)
MCH: 29.1 pg (ref 26.0–34.0)
MCHC: 32.1 g/dL (ref 30.0–36.0)
MCV: 90.9 fL (ref 80.0–100.0)
Platelets: 329 10*3/uL (ref 150–400)
RBC: 3.5 MIL/uL — ABNORMAL LOW (ref 3.87–5.11)
RDW: 14.3 % (ref 11.5–15.5)
WBC: 12.5 10*3/uL — ABNORMAL HIGH (ref 4.0–10.5)
nRBC: 0 % (ref 0.0–0.2)

## 2023-07-20 MED ORDER — METHYLPREDNISOLONE SODIUM SUCC 40 MG IJ SOLR
40.0000 mg | Freq: Two times a day (BID) | INTRAMUSCULAR | Status: DC
  Administered 2023-07-20 – 2023-07-21 (×2): 40 mg via INTRAVENOUS
  Filled 2023-07-20 (×2): qty 1

## 2023-07-20 MED ORDER — IPRATROPIUM-ALBUTEROL 0.5-2.5 (3) MG/3ML IN SOLN
3.0000 mL | Freq: Four times a day (QID) | RESPIRATORY_TRACT | Status: DC
  Administered 2023-07-20 – 2023-07-21 (×4): 3 mL via RESPIRATORY_TRACT
  Filled 2023-07-20 (×4): qty 3

## 2023-07-20 NOTE — Progress Notes (Signed)
Mobility Specialist - Progress Note During mobility: HR(140), BP, SpO2(88) Post-mobility: SPO2(96)     07/20/23 1447  Oxygen Therapy  SpO2  (During ambulation)  Mobility  Activity Ambulated with assistance in hallway  Level of Assistance Standby assist, set-up cues, supervision of patient - no hands on  Assistive Device Front wheel walker  Distance Ambulated (ft) 280 ft  Range of Motion/Exercises Active  Activity Response Tolerated well  Mobility Referral Yes  $Mobility charge 1 Mobility   Pt resting in bed on 2L upon entry. Pt STS and ambulates to hallway around NS SBA with RW. Pt increased SOB during this session with a tempoary desat to O2: 88% and HR: 140 after 100 ft. Pt stopped and took a standing rest break to utilize deep breathing for 1 minute on 3L. Pt continued walk and returned to room to take second rest break EOB. Pt recovered to 96% after 3 minutes of deep breathing. RN notified of sudden change in HR and SOB. Pt left in bed with needs in reach and RN present at bedside.   Johnathan Hausen Mobility Specialist 07/20/23, 3:06 PM

## 2023-07-20 NOTE — Progress Notes (Signed)
PROGRESS NOTE    [name] Douglas  UUV:[id number] DOB: 08-Oct-1940 DOA: 07/15/2023 PCP: Mick Sell, MD    Brief Narrative:   82 year old with history of stroke on Pradaxa, P A-fib/SVT, recently diagnosed RUL lung small cell lung cancer status post radiation in September, COPD, HTN, DM2, CHF with preserved EF admitted for shortness of breath.  Initially found to be in SVT broke with adenosine.  CT chest negative for PE but possible lymphadenopathy from mets.  She also reported of hemoptysis.  Currently being treated for bronchitis/pneumonia with COPD exacerbation.  Hemoptysis appears to have subsided for now.  CT scan showing possible progression of malignancy therefore notified her oncology team.    Assessment & Plan:   Principal Problem:   Hemoptysis Active Problems:   COPD (chronic obstructive pulmonary disease) (HCC)   CAP (community acquired pneumonia)  Acute hypoxic respiratory failure Hemoptysis I suspect combination of COPD exacerbation/pneumonitis and worsening malignancy.  Hemoptysis from persistent coughing and being on Pradaxa.  CT scan does show concerning for pneumonia but possibly worsening of malignancy therefore I have notified Dr. Carmina Miller from radiation oncology to possibly repeat scans in the near future, he is planning of it in January.  Family aware and will follow up with rad onc post dc   Acute COPD exacerbation with concerns of bronchitis/pneumonia Still wheezing on exam as of 11/30 Plan: Increase Solu-Medrol 40 mg twice daily Changed to DuoNebs every 6 hours Aggressive pulmonary hygiene Completed course of doxycycline    Paroxysmal SVT/PAF -Triggered by hypoxia and COPD exacerbation.  Initially in SVT requiring adenosine.  Continue home medication including flecainide and Pradaxa.  Heart rate controlled.  No indication for telemetry   Elevated troponins EKG is nonischemic.  I suspect secondary to SVT and underlying pulmonary condition.     Bilateral pleural effusion -Patient appears to be euvolemic, suspect pleural effusion might be malignant metastasis from RUL cancer.   AKI Baseline creatinine 0.9, admission 1.44.  Now resolved   RUL non-small cell lung cancer -Status post radiation therapy    DVT prophylaxis: Pradaxa Code Status: Full Family Communication: daughter Carollee Herter [phone] via phone 11/27, 11/29 Disposition Plan: Status is: Inpatient Remains inpatient appropriate because: COPD exacerbation.  Anticipate discharge 48 hours   Level of care: Telemetry Medical  Consultants:  None  Procedures:  None  Antimicrobials: None   Subjective: Seen and examined.  Resting in bed.  No visible distress.  No conversational dyspnea.  Answers all questions appropriately.  Objective: Vitals:   07/20/23 0141 07/20/23 0459 07/20/23 0730 07/20/23 0757  BP:  (!) 125/48  110/61  Pulse:  86  86  Resp:  18  18  Temp:  99 F (37.2 C)  98.2 F (36.8 C)  TempSrc:      SpO2: 95% 97% 96% 96%  Weight:      Height:       No intake or output data in the 24 hours ending 07/20/23 1129  Filed Weights   07/15/23 0953  Weight: 90.8 kg    Examination:  General exam: No acute distress.  Appears in good spirits Respiratory system: Diffuse wheezing in all lung fields.  Mildly tachypneic.  Normal work of breathing.  4 L Cardiovascular system: S1-S2, RRR, no murmurs, no pedal edema Gastrointestinal system: Thin, soft, NT/ND, normal bowel sounds Central nervous system: Alert and oriented. No focal neurological deficits. Extremities: Symmetric 5 x 5 power. Skin: No rashes, lesions or ulcers Psychiatry: Judgement and insight appear normal. Mood &  affect appropriate.     Data Reviewed: I have personally reviewed following labs and imaging studies  CBC: Recent Labs  Lab 07/15/23 0852 07/16/23 0618 07/17/23 0411 07/18/23 0416 07/19/23 0623 07/20/23 0526  WBC 10.9* 11.8* 12.1* 12.6* 14.0* 12.5*  NEUTROABS 8.6*   --   --   --   --   --   HGB 10.1* 8.8* 9.1* 9.7* 10.7* 10.2*  HCT 32.4* 27.2* 28.3* 30.7* 34.2* 31.8*  MCV 91.0 90.7 90.7 92.5 92.2 90.9  PLT 338 275 306 322 353 329   Basic Metabolic Panel: Recent Labs  Lab 07/15/23 0852 07/16/23 0618 07/17/23 0411 07/18/23 0416 07/19/23 0623 07/20/23 0526  NA 134* 137 137 140 142 137  K 4.2 4.5 4.6 4.8 5.1 4.2  CL 98 104 104 104 102 100  CO2 24 24 25 27 31 28   GLUCOSE 103* 129* 172* 124* 94 125*  BUN 24* 24* 30* 26* 27* 35*  CREATININE 1.44* 1.00 0.94 0.81 0.93 1.00  CALCIUM 8.7* 8.7* 8.8* 9.1 9.0 8.7*  MG 2.0  --  2.1 2.4 2.2 2.4  PHOS  --   --  2.7  --   --   --    GFR: Estimated Creatinine Clearance: 46.5 mL/min (by C-G formula based on SCr of 1 mg/dL). Liver Function Tests: Recent Labs  Lab 07/15/23 0852  AST 26  ALT 17  ALKPHOS 77  BILITOT 0.9  PROT 6.5  ALBUMIN 3.6   No results for input(s): "LIPASE", "AMYLASE" in the last 168 hours. No results for input(s): "AMMONIA" in the last 168 hours. Coagulation Profile: No results for input(s): "INR", "PROTIME" in the last 168 hours. Cardiac Enzymes: No results for input(s): "CKTOTAL", "CKMB", "CKMBINDEX", "TROPONINI" in the last 168 hours. BNP (last 3 results) No results for input(s): "PROBNP" in the last 8760 hours. HbA1C: No results for input(s): "HGBA1C" in the last 72 hours. CBG: No results for input(s): "GLUCAP" in the last 168 hours. Lipid Profile: No results for input(s): "CHOL", "HDL", "LDLCALC", "TRIG", "CHOLHDL", "LDLDIRECT" in the last 72 hours. Thyroid Function Tests: No results for input(s): "TSH", "T4TOTAL", "FREET4", "T3FREE", "THYROIDAB" in the last 72 hours. Anemia Panel: No results for input(s): "VITAMINB12", "FOLATE", "FERRITIN", "TIBC", "IRON", "RETICCTPCT" in the last 72 hours. Sepsis Labs: No results for input(s): "PROCALCITON", "LATICACIDVEN" in the last 168 hours.  No results found for this or any previous visit (from the past 240 hour(s)).        Radiology Studies: No results found.      Scheduled Meds:  amLODipine  5 mg Oral Daily   arformoterol  15 mcg Nebulization BID   vitamin C  500 mg Oral Daily   atorvastatin  80 mg Oral Q24H   vitamin B-12  500 mcg Oral Daily   dabigatran  150 mg Oral Q12H   donepezil  10 mg Oral QHS   flecainide  50 mg Oral BID   guaiFENesin  1,200 mg Oral BID   ipratropium-albuterol  3 mL Nebulization Q6H   levothyroxine  88 mcg Oral Q0600   loratadine  10 mg Oral Daily   melatonin  5 mg Oral QHS   methylPREDNISolone (SOLU-MEDROL) injection  40 mg Intravenous Q12H   venlafaxine XR  150 mg Oral Q breakfast   Continuous Infusions:   LOS: 4 days   Tresa Moore, MD [facility]   If 7PM-7AM, please contact night-coverage  07/20/2023, 11:29 AM

## 2023-07-21 DIAGNOSIS — R042 Hemoptysis: Secondary | ICD-10-CM | POA: Diagnosis not present

## 2023-07-21 LAB — BASIC METABOLIC PANEL
Anion gap: 11 (ref 5–15)
BUN: 40 mg/dL — ABNORMAL HIGH (ref 8–23)
CO2: 25 mmol/L (ref 22–32)
Calcium: 9.1 mg/dL (ref 8.9–10.3)
Chloride: 102 mmol/L (ref 98–111)
Creatinine, Ser: 0.79 mg/dL (ref 0.44–1.00)
GFR, Estimated: >60 mL/min (ref >60)
Glucose, Bld: 157 mg/dL — ABNORMAL HIGH (ref 70–99)
Potassium: 4.6 mmol/L (ref 3.5–5.1)
Sodium: 138 mmol/L (ref 135–145)

## 2023-07-21 LAB — CBC
HCT: 33.5 % — ABNORMAL LOW (ref 36.0–46.0)
Hemoglobin: 10.8 g/dL — ABNORMAL LOW (ref 12.0–15.0)
MCH: 28.7 pg (ref 26.0–34.0)
MCHC: 32.2 g/dL (ref 30.0–36.0)
MCV: 89.1 fL (ref 80.0–100.0)
Platelets: 397 10*3/uL (ref 150–400)
RBC: 3.76 MIL/uL — ABNORMAL LOW (ref 3.87–5.11)
RDW: 14.2 % (ref 11.5–15.5)
WBC: 14.1 10*3/uL — ABNORMAL HIGH (ref 4.0–10.5)
nRBC: 0 % (ref 0.0–0.2)

## 2023-07-21 LAB — MAGNESIUM: Magnesium: 2.5 mg/dL — ABNORMAL HIGH (ref 1.7–2.4)

## 2023-07-21 MED ORDER — IPRATROPIUM-ALBUTEROL 0.5-2.5 (3) MG/3ML IN SOLN
3.0000 mL | Freq: Three times a day (TID) | RESPIRATORY_TRACT | Status: DC
  Administered 2023-07-21 – 2023-07-27 (×17): 3 mL via RESPIRATORY_TRACT
  Filled 2023-07-21 (×16): qty 3

## 2023-07-21 MED ORDER — METHYLPREDNISOLONE SODIUM SUCC 40 MG IJ SOLR
40.0000 mg | Freq: Every day | INTRAMUSCULAR | Status: DC
  Administered 2023-07-22 – 2023-07-23 (×2): 40 mg via INTRAVENOUS
  Filled 2023-07-21 (×2): qty 1

## 2023-07-21 MED ORDER — ORAL CARE MOUTH RINSE: 15.0000 mL | OROMUCOSAL | Status: DC | PRN

## 2023-07-21 NOTE — Plan of Care (Signed)

## 2023-07-21 NOTE — TOC Progression Note (Signed)
Transition of Care [facility]) - Progression Note    Patient Details  Name: [name] Douglas MRN: [id number] Date of Birth: 09-May-1941  [facility]) CM/SW Contact  Liliana Cline, LCSW Phone Number: 07/21/2023, 3:33 PM  Clinical Narrative:    Patient and family inquired when nebulizer will be delivered. Attempted call to [facility]. Office hours are M-F 8:30-5:00. TOC will follow up tomorrow.    Expected Discharge Plan: Home w Home Health Services    Expected Discharge Plan and Services                         DME Arranged: 3-N-1, Nebulizer machine DME Agency: [employer], Other - Comment ([facility]) Date DME Agency Contacted: 07/17/23   Representative spoke with at DME Agency: [employer]: Yvone Neu, [facility]: Rocky Morel [facility] Arranged: PT, OT [facility] Agency: [facility] Date [facility] Agency Contacted: 07/17/23   Representative spoke with at [facility] Agency: Duwaine Maxin   Social Determinants of Health (SDOH) Interventions SDOH Screenings   Food Insecurity: No Food Insecurity (07/15/2023)  Housing: Low Risk  (07/15/2023)  Transportation Needs: No Transportation Needs (07/15/2023)  Utilities: Not At Risk (07/15/2023)  Alcohol Screen: Low Risk  (08/08/2020)  Depression (PHQ2-9): Low Risk  (04/10/2021)  Financial Resource Strain: Low Risk  (06/03/2023)   Received from [facility]  Tobacco Use: High Risk (07/18/2023)    Readmission Risk Interventions    07/17/2023   11:52 AM  Readmission Risk Prevention Plan  Transportation Screening Complete  PCP or Specialist Appt within 3-5 Days Complete  HRI or Home Care Consult Complete  Social Work Consult for Recovery Care Planning/Counseling Complete  Palliative Care Screening Not Applicable  Medication Review Oceanographer) Complete

## 2023-07-21 NOTE — Progress Notes (Signed)
PROGRESS NOTE    [name] Douglas  UJW:[id number] DOB: Mar 15, 1941 DOA: 07/15/2023 PCP: Mick Sell, MD    Brief Narrative:   82 year old with history of stroke on Pradaxa, P A-fib/SVT, recently diagnosed RUL lung small cell lung cancer status post radiation in September, COPD, HTN, DM2, CHF with preserved EF admitted for shortness of breath.  Initially found to be in SVT broke with adenosine.  CT chest negative for PE but possible lymphadenopathy from mets.  She also reported of hemoptysis.  Currently being treated for bronchitis/pneumonia with COPD exacerbation.  Hemoptysis appears to have subsided for now.  CT scan showing possible progression of malignancy therefore notified her oncology team.    Assessment & Plan:   Principal Problem:   Hemoptysis Active Problems:   COPD (chronic obstructive pulmonary disease) (HCC)   CAP (community acquired pneumonia)  Acute hypoxic respiratory failure Hemoptysis I suspect combination of COPD exacerbation/pneumonitis and worsening malignancy.  Hemoptysis from persistent coughing and being on Pradaxa.  CT scan does show concerning for pneumonia but possibly worsening of malignancy therefore I have notified Dr. Carmina Miller from radiation oncology to possibly repeat scans in the near future, he is planning of it in January.  Family aware and will follow up with rad onc post dc   Acute COPD exacerbation with concerns of bronchitis/pneumonia Still wheezing on exam as of 11/30 Improving as of 12/1 Plan: Solu-Medrol 40 mg once daily with plans to further de-escalate to p.o. prednisone starting 12/2.  Continue scheduled DuoNebs and Brovana.  Continue with aggressive pulmonary hygiene.  No further antibiotics.  Anticipate discharge 12/2.   Paroxysmal SVT/PAF -Triggered by hypoxia and COPD exacerbation.  Initially in SVT requiring adenosine.  Continue home medication including flecainide and Pradaxa.  Heart rate controlled.  Cardiac monitoring  discontinued   Elevated troponins EKG is nonischemic.  I suspect secondary to SVT and underlying pulmonary condition.    Bilateral pleural effusion -Patient appears to be euvolemic, suspect pleural effusion might be malignant metastasis from RUL cancer.   AKI Baseline creatinine 0.9, admission 1.44.  Now resolved   RUL non-small cell lung cancer -Status post radiation therapy    DVT prophylaxis: Pradaxa Code Status: Full Family Communication: daughter Carollee Herter [phone] via phone 11/27, 11/29 Disposition Plan: Status is: Inpatient Remains inpatient appropriate because: COPD exacerbation.  Anticipate discharge 12/2   Level of care: Telemetry Medical  Consultants:  None  Procedures:  None  Antimicrobials: None   Subjective: Seen and examined.  Resting in bed.  No visible distress.  No conversational dyspnea noted.  Answers all questions appropriately.  Objective: Vitals:   07/20/23 2048 07/21/23 0435 07/21/23 0746 07/21/23 0811  BP:  (!) 144/61  (!) 164/62  Pulse:  78  75  Resp:  16  20  Temp:  (!) 97.5 F (36.4 C)  97.7 F (36.5 C)  TempSrc:  Oral  Oral  SpO2: 93% 93% 99% 95%  Weight:      Height:       No intake or output data in the 24 hours ending 07/21/23 1133  Filed Weights   07/15/23 0953  Weight: 90.8 kg    Examination:  General exam: Cute distress.  Sitting up in bed.  In good spirits.  No conversational dyspnea. Respiratory system: Bibasilar wheezes.  Normal work of breathing.  2 L Cardiovascular system: S1-S2, RRR, no murmurs, no pedal edema Gastrointestinal system: Thin, soft, NT/ND, normal bowel sounds Central nervous system: Alert and oriented. No focal neurological deficits.  Extremities: Symmetric 5 x 5 power. Skin: No rashes, lesions or ulcers Psychiatry: Judgement and insight appear normal. Mood & affect appropriate.     Data Reviewed: I have personally reviewed following labs and imaging studies  CBC: Recent Labs  Lab  07/15/23 0852 07/16/23 0618 07/17/23 0411 07/18/23 0416 07/19/23 0623 07/20/23 0526 07/21/23 0540  WBC 10.9*   < > 12.1* 12.6* 14.0* 12.5* 14.1*  NEUTROABS 8.6*  --   --   --   --   --   --   HGB 10.1*   < > 9.1* 9.7* 10.7* 10.2* 10.8*  HCT 32.4*   < > 28.3* 30.7* 34.2* 31.8* 33.5*  MCV 91.0   < > 90.7 92.5 92.2 90.9 89.1  PLT 338   < > 306 322 353 329 397   < > = values in this interval not displayed.   Basic Metabolic Panel: Recent Labs  Lab 07/17/23 0411 07/18/23 0416 07/19/23 0623 07/20/23 0526 07/21/23 0540  NA 137 140 142 137 138  K 4.6 4.8 5.1 4.2 4.6  CL 104 104 102 100 102  CO2 25 27 31 28 25   GLUCOSE 172* 124* 94 125* 157*  BUN 30* 26* 27* 35* 40*  CREATININE 0.94 0.81 0.93 1.00 0.79  CALCIUM 8.8* 9.1 9.0 8.7* 9.1  MG 2.1 2.4 2.2 2.4 2.5*  PHOS 2.7  --   --   --   --    GFR: Estimated Creatinine Clearance: 58.1 mL/min (by C-G formula based on SCr of 0.79 mg/dL). Liver Function Tests: Recent Labs  Lab 07/15/23 0852  AST 26  ALT 17  ALKPHOS 77  BILITOT 0.9  PROT 6.5  ALBUMIN 3.6   No results for input(s): "LIPASE", "AMYLASE" in the last 168 hours. No results for input(s): "AMMONIA" in the last 168 hours. Coagulation Profile: No results for input(s): "INR", "PROTIME" in the last 168 hours. Cardiac Enzymes: No results for input(s): "CKTOTAL", "CKMB", "CKMBINDEX", "TROPONINI" in the last 168 hours. BNP (last 3 results) No results for input(s): "PROBNP" in the last 8760 hours. HbA1C: No results for input(s): "HGBA1C" in the last 72 hours. CBG: No results for input(s): "GLUCAP" in the last 168 hours. Lipid Profile: No results for input(s): "CHOL", "HDL", "LDLCALC", "TRIG", "CHOLHDL", "LDLDIRECT" in the last 72 hours. Thyroid Function Tests: No results for input(s): "TSH", "T4TOTAL", "FREET4", "T3FREE", "THYROIDAB" in the last 72 hours. Anemia Panel: No results for input(s): "VITAMINB12", "FOLATE", "FERRITIN", "TIBC", "IRON", "RETICCTPCT" in the last  72 hours. Sepsis Labs: No results for input(s): "PROCALCITON", "LATICACIDVEN" in the last 168 hours.  No results found for this or any previous visit (from the past 240 hour(s)).       Radiology Studies: No results found.      Scheduled Meds:  amLODipine  5 mg Oral Daily   arformoterol  15 mcg Nebulization BID   vitamin C  500 mg Oral Daily   atorvastatin  80 mg Oral Q24H   vitamin B-12  500 mcg Oral Daily   dabigatran  150 mg Oral Q12H   donepezil  10 mg Oral QHS   flecainide  50 mg Oral BID   guaiFENesin  1,200 mg Oral BID   ipratropium-albuterol  3 mL Nebulization Q6H   levothyroxine  88 mcg Oral Q0600   loratadine  10 mg Oral Daily   melatonin  5 mg Oral QHS   [START ON 07/22/2023] methylPREDNISolone (SOLU-MEDROL) injection  40 mg Intravenous Daily   venlafaxine XR  150  mg Oral Q breakfast   Continuous Infusions:   LOS: 5 days   Tresa Moore, MD [facility]   If 7PM-7AM, please contact night-coverage  07/21/2023, 11:33 AM

## 2023-07-22 DIAGNOSIS — R042 Hemoptysis: Secondary | ICD-10-CM | POA: Diagnosis not present

## 2023-07-22 LAB — BASIC METABOLIC PANEL
Anion gap: 10 (ref 5–15)
BUN: 38 mg/dL — ABNORMAL HIGH (ref 8–23)
CO2: 26 mmol/L (ref 22–32)
Calcium: 8.8 mg/dL — ABNORMAL LOW (ref 8.9–10.3)
Chloride: 104 mmol/L (ref 98–111)
Creatinine, Ser: 0.85 mg/dL (ref 0.44–1.00)
GFR, Estimated: >60 mL/min (ref >60)
Glucose, Bld: 131 mg/dL — ABNORMAL HIGH (ref 70–99)
Potassium: 4 mmol/L (ref 3.5–5.1)
Sodium: 140 mmol/L (ref 135–145)

## 2023-07-22 LAB — CBC
HCT: 30.7 % — ABNORMAL LOW (ref 36.0–46.0)
Hemoglobin: 10.1 g/dL — ABNORMAL LOW (ref 12.0–15.0)
MCH: 29.1 pg (ref 26.0–34.0)
MCHC: 32.9 g/dL (ref 30.0–36.0)
MCV: 88.5 fL (ref 80.0–100.0)
Platelets: 331 10*3/uL (ref 150–400)
RBC: 3.47 MIL/uL — ABNORMAL LOW (ref 3.87–5.11)
RDW: 14.4 % (ref 11.5–15.5)
WBC: 15.6 10*3/uL — ABNORMAL HIGH (ref 4.0–10.5)
nRBC: 0 % (ref 0.0–0.2)

## 2023-07-22 LAB — MAGNESIUM: Magnesium: 2.4 mg/dL (ref 1.7–2.4)

## 2023-07-22 NOTE — Care Management Important Message (Signed)
Important Message  Patient Details  Name: [name] Douglas MRN: [id number] Date of Birth: 1941/07/02   Important Message Given:  Yes - Medicare IM     Olegario Messier A Jackie Littlejohn 07/22/2023, 3:30 PM

## 2023-07-22 NOTE — Progress Notes (Signed)
Physical Therapy Treatment Patient Details Name: [name] Douglas MRN: [id number] DOB: [birth year] Today's Date: 07/22/2023   History of Present Illness 82 y.o. female presented with worsening of productive cough, wheezing, SOB and coughing up streaks of blood. CT chest findings: no PE, possible lymphadenopathy suspicious for metastatic disease.  Bilateral small pleural effusion. PMHx: stroke, PAF/paroxysmal SVT, recently diagnosed RUL non-small cell carcinoma s/p radiation therapy x 3 weeks in Sept, COPD, HTN, IIDM, chronic HFpEF.    PT Comments  Pt received in bed on 2L of O2 [place] and agreed to PT session. Pt performed bed mobility ModI, STS with the use of RW (2wheels) SUP and amb ~378ft with RW SUP-CGA. Pt did not need any rest breaks during session, however VC necessary throughout session for instructed pursed lip breathing and RW management. During mobility O2 increased to 3L with vitals reading 87-94% SpO2 on O2. Prior to ending session back in bed on 2L of O2 [place], pt used restroom IND and presented with good standing balance without the use of AD. Following mobility, pt presented with SOB which was resolved with instructed pursed lip breathing. Pt tolerated Tx well and will continue to benefit from skilled PT sessions to improve endurance, strength, functional mobility, and activity tolerance to maximize safety/return to PLOF following D/C.    If plan is discharge home, recommend the following: A little help with walking and/or transfers;Assist for transportation;Help with stairs or ramp for entrance   Can travel by private vehicle        Equipment Recommendations  None recommended by PT    Recommendations for Other Services       Precautions / Restrictions Precautions Precautions: Fall Restrictions Weight Bearing Restrictions: No     Mobility  Bed Mobility Overal bed mobility: Modified Independent             General bed mobility comments: Pt performed bed mobility ModI and  did not report any s/sx relative to dizziness while seated EOB.    Transfers Overall transfer level: Needs assistance Equipment used: Rolling walker (2 wheels) Transfers: Sit to/from Stand Sit to Stand: Supervision           General transfer comment: Pt performed STS with the use of RW (2wheels) SUP and did not report any s/sx relative to dizziness when standing.    Ambulation/Gait Ambulation/Gait assistance: Supervision, Contact guard assist Gait Distance (Feet): 300 Feet Assistive device: Rolling walker (2 wheels) Gait Pattern/deviations: Step-through pattern Gait velocity: wnl     General Gait Details: Pt amb with the use of RW (2wheels) SUP-CGA. VC necessary for RW management and instructed pursed lip breathing.   Stairs             Wheelchair Mobility     Tilt Bed    Modified Rankin (Stroke Patients Only)       Balance Overall balance assessment: Needs assistance Sitting-balance support: No upper extremity supported, Feet supported Sitting balance-Leahy Scale: Good     Standing balance support: Bilateral upper extremity supported, During functional activity Standing balance-Leahy Scale: Good                              Cognition Arousal: Alert Behavior During Therapy: WFL for tasks assessed/performed Overall Cognitive Status: Within Functional Limits for tasks assessed  General Comments: AOx4. Pt pleasant and willing to participate in PT session.        Exercises      General Comments        Pertinent Vitals/Pain Pain Assessment Pain Assessment: No/denies pain    Home Living                          Prior Function            PT Goals (current goals can now be found in the care plan section) Acute Rehab PT Goals Patient Stated Goal: To go home ASAP PT Goal Formulation: With patient Time For Goal Achievement: 07/30/23 Potential to Achieve Goals: Good Progress  towards PT goals: Progressing toward goals    Frequency    Min 1X/week      PT Plan      Co-evaluation              AM-PAC PT "6 Clicks" Mobility   Outcome Measure  Help needed turning from your back to your side while in a flat bed without using bedrails?: None Help needed moving from lying on your back to sitting on the side of a flat bed without using bedrails?: None Help needed moving to and from a bed to a chair (including a wheelchair)?: A Little Help needed standing up from a chair using your arms (e.g., wheelchair or bedside chair)?: A Little Help needed to walk in hospital room?: A Little Help needed climbing 3-5 steps with a railing? : A Little 6 Click Score: 20    End of Session Equipment Utilized During Treatment: Oxygen (2/3L) Activity Tolerance: Patient tolerated treatment well;Patient limited by fatigue Patient left: in bed;with call bell/phone within reach Nurse Communication: Mobility status PT Visit Diagnosis: Unsteadiness on feet (R26.81);Other abnormalities of gait and mobility (R26.89);Difficulty in walking, not elsewhere classified (R26.2);Muscle weakness (generalized) (M62.81)     Time: 4098-1191 PT Time Calculation (min) (ACUTE ONLY): 14 min  Charges:                            Tanekia Ryans Sauvignon Howard SPT, LAT, ATC  Assia Meanor Sauvignon-Howard 07/22/2023, 10:54 AM

## 2023-07-22 NOTE — Progress Notes (Signed)
PROGRESS NOTE    [name] Douglas  YNW:[id number] DOB: Feb 26, 1941 DOA: 07/15/2023 PCP: Mick Sell, MD    Brief Narrative:   82 year old with history of stroke on Pradaxa, P A-fib/SVT, recently diagnosed RUL lung small cell lung cancer status post radiation in September, COPD, HTN, DM2, CHF with preserved EF admitted for shortness of breath.  Initially found to be in SVT broke with adenosine.  CT chest negative for PE but possible lymphadenopathy from mets.  She also reported of hemoptysis.  Currently being treated for bronchitis/pneumonia with COPD exacerbation.  Hemoptysis appears to have subsided for now.  CT scan showing possible progression of malignancy therefore notified her oncology team.    Assessment & Plan:   Principal Problem:   Hemoptysis Active Problems:   COPD (chronic obstructive pulmonary disease) (HCC)   CAP (community acquired pneumonia)  Acute hypoxic respiratory failure Hemoptysis I suspect combination of COPD exacerbation/pneumonitis and worsening malignancy.  Hemoptysis from persistent coughing and being on Pradaxa.  CT scan does show concerning for pneumonia but possibly worsening of malignancy therefore I have notified Dr. Carmina Miller from radiation oncology to possibly repeat scans in the near future, he is planning of it in January.  Family aware and will follow up with rad onc post dc   Acute COPD exacerbation with concerns of bronchitis/pneumonia Still wheezing on exam as of 11/30 Improving as of 12/1 Still wheezing as of 12/2 Plan: Continue IV Solu-Medrol 40 mg once daily.  Continue scheduled DuoNebs and Brovana.  Continue with aggressive pulmonary hygiene.  No further antibiotics.  Hopeful to discharge 12/3.   Paroxysmal SVT/PAF -Triggered by hypoxia and COPD exacerbation.  Initially in SVT requiring adenosine.  Continue home medication including flecainide and Pradaxa.  Heart rate controlled.  Outpatient follow-up with cardiology/EP    Elevated troponins EKG is nonischemic.  I suspect secondary to SVT and underlying pulmonary condition.    Bilateral pleural effusion -Patient appears to be euvolemic, suspect pleural effusion might be malignant metastasis from RUL cancer.   AKI Baseline creatinine 0.9, admission 1.44.  Now resolved   RUL non-small cell lung cancer -Status post radiation therapy    DVT prophylaxis: Pradaxa Code Status: Full Family Communication: daughter Carollee Herter [phone] via phone 11/27, 11/29.  Left voicemail on 12/2 Disposition Plan: Status is: Inpatient Remains inpatient appropriate because: COPD exacerbation.  Anticipate discharge 12/2   Level of care: Telemetry Medical  Consultants:  None  Procedures:  None  Antimicrobials: None   Subjective: Seen and examined.  Resting comfortably in bed.  No visible distress.  No conversational dyspnea noted.  Answer the questions appropriately.  Objective: Vitals:   07/21/23 2019 07/21/23 2027 07/22/23 0506 07/22/23 0811  BP: (!) 147/57  (!) 154/60 (!) 156/58  Pulse: 92  88 82  Resp: 20  20 18   Temp: 98.2 F (36.8 C)   98 F (36.7 C)  TempSrc: Oral   Oral  SpO2: 94% 94% 98% 98%  Weight:      Height:       No intake or output data in the 24 hours ending 07/22/23 1318  Filed Weights   07/15/23 0953  Weight: 90.8 kg    Examination:  General exam: No distress.  Sitting up in bed.  In good spirits.  No conversational dyspnea noted. Respiratory system: Wheeze bilaterally.  Normal work of breathing.  2 L Cardiovascular system: S1-S2, RRR, no murmurs, no pedal edema Gastrointestinal system: Thin, soft, NT/ND, normal bowel sounds Central nervous system: Alert  and oriented. No focal neurological deficits. Extremities: Symmetric 5 x 5 power. Skin: No rashes, lesions or ulcers Psychiatry: Judgement and insight appear normal. Mood & affect appropriate.     Data Reviewed: I have personally reviewed following labs and imaging  studies  CBC: Recent Labs  Lab 07/18/23 0416 07/19/23 0623 07/20/23 0526 07/21/23 0540 07/22/23 0213  WBC 12.6* 14.0* 12.5* 14.1* 15.6*  HGB 9.7* 10.7* 10.2* 10.8* 10.1*  HCT 30.7* 34.2* 31.8* 33.5* 30.7*  MCV 92.5 92.2 90.9 89.1 88.5  PLT 322 353 329 397 331   Basic Metabolic Panel: Recent Labs  Lab 07/17/23 0411 07/18/23 0416 07/19/23 0623 07/20/23 0526 07/21/23 0540 07/22/23 0213  NA 137 140 142 137 138 140  K 4.6 4.8 5.1 4.2 4.6 4.0  CL 104 104 102 100 102 104  CO2 25 27 31 28 25 26   GLUCOSE 172* 124* 94 125* 157* 131*  BUN 30* 26* 27* 35* 40* 38*  CREATININE 0.94 0.81 0.93 1.00 0.79 0.85  CALCIUM 8.8* 9.1 9.0 8.7* 9.1 8.8*  MG 2.1 2.4 2.2 2.4 2.5* 2.4  PHOS 2.7  --   --   --   --   --    GFR: Estimated Creatinine Clearance: 54.7 mL/min (by C-G formula based on SCr of 0.85 mg/dL). Liver Function Tests: No results for input(s): "AST", "ALT", "ALKPHOS", "BILITOT", "PROT", "ALBUMIN" in the last 168 hours.  No results for input(s): "LIPASE", "AMYLASE" in the last 168 hours. No results for input(s): "AMMONIA" in the last 168 hours. Coagulation Profile: No results for input(s): "INR", "PROTIME" in the last 168 hours. Cardiac Enzymes: No results for input(s): "CKTOTAL", "CKMB", "CKMBINDEX", "TROPONINI" in the last 168 hours. BNP (last 3 results) No results for input(s): "PROBNP" in the last 8760 hours. HbA1C: No results for input(s): "HGBA1C" in the last 72 hours. CBG: No results for input(s): "GLUCAP" in the last 168 hours. Lipid Profile: No results for input(s): "CHOL", "HDL", "LDLCALC", "TRIG", "CHOLHDL", "LDLDIRECT" in the last 72 hours. Thyroid Function Tests: No results for input(s): "TSH", "T4TOTAL", "FREET4", "T3FREE", "THYROIDAB" in the last 72 hours. Anemia Panel: No results for input(s): "VITAMINB12", "FOLATE", "FERRITIN", "TIBC", "IRON", "RETICCTPCT" in the last 72 hours. Sepsis Labs: No results for input(s): "PROCALCITON", "LATICACIDVEN" in the  last 168 hours.  No results found for this or any previous visit (from the past 240 hour(s)).       Radiology Studies: No results found.      Scheduled Meds:  amLODipine  5 mg Oral Daily   arformoterol  15 mcg Nebulization BID   vitamin C  500 mg Oral Daily   atorvastatin  80 mg Oral Q24H   vitamin B-12  500 mcg Oral Daily   dabigatran  150 mg Oral Q12H   donepezil  10 mg Oral QHS   flecainide  50 mg Oral BID   guaiFENesin  1,200 mg Oral BID   ipratropium-albuterol  3 mL Nebulization TID   levothyroxine  88 mcg Oral Q0600   loratadine  10 mg Oral Daily   melatonin  5 mg Oral QHS   methylPREDNISolone (SOLU-MEDROL) injection  40 mg Intravenous Daily   venlafaxine XR  150 mg Oral Q breakfast   Continuous Infusions:   LOS: 6 days   Tresa Moore, MD [facility]   If 7PM-7AM, please contact night-coverage  07/22/2023, 1:18 PM

## 2023-07-22 NOTE — TOC Progression Note (Addendum)
Transition of Care [facility]) - Progression Note    Patient Details  Name: [name] Douglas MRN: [id number] Date of Birth: [birth year]  Transition of Care [facility]) CM/SW Contact  Margarito Liner, LCSW Phone Number: 07/22/2023, 10:19 AM  Clinical Narrative:  [facility] will delivery nebulizer to the room today.   11:36 am: Patient asleep. CSW left voicemail for daughter to update about the nebulizer machine.  11:48 am: Received call back from daughter. She asked if patient could get a mask with it. [facility] will send with the equipment if the delivery person has not already left.  1:09 pm: Nebulizer machine has been delivered. Delivery liaison was not notified of mask in time to get one before he left the office but said our masks are compatible with the machine. CSW asked RN to send her home with nebulizer mask at discharge. CSW left daughter a voicemail to notify.  Expected Discharge Plan: Home w Home Health Services    Expected Discharge Plan and Services                         DME Arranged: 3-N-1, Nebulizer machine DME Agency: [employer], Other - Comment ([facility]) Date DME Agency Contacted: 07/17/23   Representative spoke with at DME Agency: [employer]: Yvone Neu, [facility]: Rocky Morel [facility] Arranged: PT, OT [facility] Agency: [facility] Date [facility] Agency Contacted: 07/17/23   Representative spoke with at [facility] Agency: Duwaine Maxin   Social Determinants of Health (SDOH) Interventions SDOH Screenings   Food Insecurity: No Food Insecurity (07/15/2023)  Housing: Low Risk  (07/15/2023)  Transportation Needs: No Transportation Needs (07/15/2023)  Utilities: Not At Risk (07/15/2023)  Alcohol Screen: Low Risk  (08/08/2020)  Depression (PHQ2-9): Low Risk  (04/10/2021)  Financial Resource Strain: Low Risk  (06/03/2023)   Received from [facility]  Tobacco Use: High Risk (07/18/2023)    Readmission Risk  Interventions    07/17/2023   11:52 AM  Readmission Risk Prevention Plan  Transportation Screening Complete  PCP or Specialist Appt within 3-5 Days Complete  HRI or Home Care Consult Complete  Social Work Consult for Recovery Care Planning/Counseling Complete  Palliative Care Screening Not Applicable  Medication Review [employer]) Complete

## 2023-07-22 NOTE — Plan of Care (Signed)
  Problem: Education: Goal: Knowledge of General Education information will improve Description Including pain rating scale, medication(s)/side effects and non-pharmacologic comfort measures Outcome: Progressing   Problem: Health Behavior/Discharge Planning: Goal: Ability to manage health-related needs will improve Outcome: Progressing   

## 2023-07-23 ENCOUNTER — Inpatient Hospital Stay: Payer: Medicare Other

## 2023-07-23 DIAGNOSIS — R042 Hemoptysis: Secondary | ICD-10-CM | POA: Diagnosis not present

## 2023-07-23 LAB — C-REACTIVE PROTEIN: CRP: 0.8 mg/dL (ref <1.0)

## 2023-07-23 MED ORDER — FUROSEMIDE 10 MG/ML IJ SOLN
40.0000 mg | Freq: Every day | INTRAMUSCULAR | Status: DC
  Administered 2023-07-23 – 2023-07-24 (×2): 40 mg via INTRAVENOUS
  Filled 2023-07-23 (×2): qty 4

## 2023-07-23 MED ORDER — DEXAMETHASONE 4 MG PO TABS
4.0000 mg | ORAL_TABLET | Freq: Every day | ORAL | Status: DC
  Administered 2023-07-23 – 2023-07-24 (×2): 4 mg via ORAL
  Filled 2023-07-23 (×2): qty 1

## 2023-07-23 NOTE — Plan of Care (Signed)
Patient alert and oriented X 4, able to get up throughout night with SBA to bathroom. Continues to havw wheezing and on 3L [place] and experience SOB with activity. Slept throughout night. Problem: Education: Goal: Knowledge of General Education information will improve Description: Including pain rating scale, medication(s)/side effects and non-pharmacologic comfort measures Outcome: Progressing   Problem: Health Behavior/Discharge Planning: Goal: Ability to manage health-related needs will improve Outcome: Progressing   Problem: Clinical Measurements: Goal: Ability to maintain clinical measurements within normal limits will improve Outcome: Progressing Goal: Will remain free from infection Outcome: Progressing Goal: Diagnostic test results will improve Outcome: Progressing Goal: Respiratory complications will improve Outcome: Progressing Goal: Cardiovascular complication will be avoided Outcome: Progressing   Problem: Activity: Goal: Risk for activity intolerance will decrease Outcome: Progressing   Problem: Nutrition: Goal: Adequate nutrition will be maintained Outcome: Progressing   Problem: Coping: Goal: Level of anxiety will decrease Outcome: Progressing   Problem: Elimination: Goal: Will not experience complications related to bowel motility Outcome: Progressing Goal: Will not experience complications related to urinary retention Outcome: Progressing   Problem: Pain Management: Goal: General experience of comfort will improve Outcome: Progressing   Problem: Safety: Goal: Ability to remain free from injury will improve Outcome: Progressing   Problem: Skin Integrity: Goal: Risk for impaired skin integrity will decrease Outcome: Progressing   Problem: Education: Goal: Knowledge of disease or condition will improve Outcome: Progressing Goal: Knowledge of the prescribed therapeutic regimen will improve Outcome: Progressing Goal: Individualized Educational  Video(s) Outcome: Progressing   Problem: Activity: Goal: Ability to tolerate increased activity will improve Outcome: Progressing Goal: Will verbalize the importance of balancing activity with adequate rest periods Outcome: Progressing   Problem: Respiratory: Goal: Ability to maintain a clear airway will improve Outcome: Progressing Goal: Levels of oxygenation will improve Outcome: Progressing Goal: Ability to maintain adequate ventilation will improve Outcome: Progressing

## 2023-07-23 NOTE — Progress Notes (Signed)
PROGRESS NOTE    [name] Douglas  WUJ:[id number] DOB: Aug 24, 1940 DOA: 07/15/2023 PCP: Mick Sell, MD    Brief Narrative:   82 year old with history of stroke on Pradaxa, P A-fib/SVT, recently diagnosed RUL lung small cell lung cancer status post radiation in September, COPD, HTN, DM2, CHF with preserved EF admitted for shortness of breath.  Initially found to be in SVT broke with adenosine.  CT chest negative for PE but possible lymphadenopathy from mets.  She also reported of hemoptysis.  Currently being treated for bronchitis/pneumonia with COPD exacerbation.  Hemoptysis appears to have subsided for now.  CT scan showing possible progression of malignancy therefore notified her oncology team.    Assessment & Plan:   Principal Problem:   Hemoptysis Active Problems:   COPD (chronic obstructive pulmonary disease) (HCC)   CAP (community acquired pneumonia)  Acute hypoxic respiratory failure Hemoptysis I suspect combination of COPD exacerbation/pneumonitis and worsening malignancy.  Hemoptysis from persistent coughing and being on Pradaxa.  CT scan does show concerning for pneumonia but possibly worsening of malignancy therefore I have notified Dr. Carmina Miller from radiation oncology to possibly repeat scans in the near future, he is planning of it in January.  Family aware and will follow up with rad onc post dc   Acute COPD exacerbation with concerns of bronchitis/pneumonia Still wheezing on exam as of 11/30 Improving as of 12/1 Still wheezing as of 12/2 Wheezing persistent on 12/3.  Pulmonary engaged for comment. Plan: Case discussed with pulmonology consultant.  Greatly appreciate recommendations.  Per pulmonary decrease steroids to dexamethasone 4 mg daily.  Lasix trial for work of breathing.  Zithromax 250 mg daily.  CRP trend.  Recruitment with MetaNeb and albuterol 3 times daily.  Wean oxygen as able with goal SpO2 88%.  Paroxysmal SVT/PAF -Triggered by hypoxia and  COPD exacerbation.  Initially in SVT requiring adenosine.  Continue home medication including flecainide and Pradaxa.  Heart rate controlled.  Outpatient follow-up with cardiology/EP   Elevated troponins EKG is nonischemic.  I suspect secondary to SVT and underlying pulmonary condition.    Bilateral pleural effusion -Patient appears to be euvolemic, unclear nature of pleural effusion.  Slightly worse.  Possibly secondary to atelectasis versus congestive heart failure.  Lasix trial as above.   AKI Baseline creatinine 0.9, admission 1.44.  Now resolved   RUL non-small cell lung cancer -Status post radiation therapy    DVT prophylaxis: Pradaxa Code Status: Full Family Communication: daughter Carollee Herter [phone] via phone 11/27, 11/29, 12/3 Disposition Plan: Status is: Inpatient Remains inpatient appropriate because: COPD exacerbation.  Anticipate discharge 12/2   Level of care: Telemetry Medical  Consultants:  None  Procedures:  None  Antimicrobials: Azithromycin   Subjective: Seen and examined.  Sitting up in bed.  In good spirits.  Visibly no distress.  Still wheezing.  Dyspneic on ambulation.  Objective: Vitals:   07/22/23 2144 07/23/23 0302 07/23/23 0757 07/23/23 0903  BP: (!) 136/52 (!) 142/59  (!) 161/79  Pulse: 85 85  82  Resp: 17 16    Temp: 98.2 F (36.8 C) 98 F (36.7 C)  97.8 F (36.6 C)  TempSrc: Oral Oral  Oral  SpO2: 95% 96% 99% 94%  Weight:      Height:        Intake/Output Summary (Last 24 hours) at 07/23/2023 1358 Last data filed at 07/22/2023 1500 Gross per 24 hour  Intake 240 ml  Output --  Net 240 ml    [employer]  07/15/23 0953  Weight: 90.8 kg    Examination:  General exam: No distress.  Sitting up in bed.  No conversational dyspnea noted. Respiratory system: Wheeze bilaterally.  Normal work of breathing.  3 L Cardiovascular system: S1-S2, RRR, no murmurs, no pedal edema Gastrointestinal system: Thin, soft, NT/ND, normal  bowel sounds Central nervous system: Alert and oriented. No focal neurological deficits. Extremities: Symmetric 5 x 5 power. Skin: No rashes, lesions or ulcers Psychiatry: Judgement and insight appear normal. Mood & affect appropriate.     Data Reviewed: I have personally reviewed following labs and imaging studies  CBC: Recent Labs  Lab 07/18/23 0416 07/19/23 0623 07/20/23 0526 07/21/23 0540 07/22/23 0213  WBC 12.6* 14.0* 12.5* 14.1* 15.6*  HGB 9.7* 10.7* 10.2* 10.8* 10.1*  HCT 30.7* 34.2* 31.8* 33.5* 30.7*  MCV 92.5 92.2 90.9 89.1 88.5  PLT 322 353 329 397 331   Basic Metabolic Panel: Recent Labs  Lab 07/17/23 0411 07/18/23 0416 07/19/23 0623 07/20/23 0526 07/21/23 0540 07/22/23 0213  NA 137 140 142 137 138 140  K 4.6 4.8 5.1 4.2 4.6 4.0  CL 104 104 102 100 102 104  CO2 25 27 31 28 25 26   GLUCOSE 172* 124* 94 125* 157* 131*  BUN 30* 26* 27* 35* 40* 38*  CREATININE 0.94 0.81 0.93 1.00 0.79 0.85  CALCIUM 8.8* 9.1 9.0 8.7* 9.1 8.8*  MG 2.1 2.4 2.2 2.4 2.5* 2.4  PHOS 2.7  --   --   --   --   --    GFR: Estimated Creatinine Clearance: 54.7 mL/min (by C-G formula based on SCr of 0.85 mg/dL). Liver Function Tests: No results for input(s): "AST", "ALT", "ALKPHOS", "BILITOT", "PROT", "ALBUMIN" in the last 168 hours.  No results for input(s): "LIPASE", "AMYLASE" in the last 168 hours. No results for input(s): "AMMONIA" in the last 168 hours. Coagulation Profile: No results for input(s): "INR", "PROTIME" in the last 168 hours. Cardiac Enzymes: No results for input(s): "CKTOTAL", "CKMB", "CKMBINDEX", "TROPONINI" in the last 168 hours. BNP (last 3 results) No results for input(s): "PROBNP" in the last 8760 hours. HbA1C: No results for input(s): "HGBA1C" in the last 72 hours. CBG: No results for input(s): "GLUCAP" in the last 168 hours. Lipid Profile: No results for input(s): "CHOL", "HDL", "LDLCALC", "TRIG", "CHOLHDL", "LDLDIRECT" in the last 72 hours. Thyroid  Function Tests: No results for input(s): "TSH", "T4TOTAL", "FREET4", "T3FREE", "THYROIDAB" in the last 72 hours. Anemia Panel: No results for input(s): "VITAMINB12", "FOLATE", "FERRITIN", "TIBC", "IRON", "RETICCTPCT" in the last 72 hours. Sepsis Labs: No results for input(s): "PROCALCITON", "LATICACIDVEN" in the last 168 hours.  No results found for this or any previous visit (from the past 240 hour(s)).       Radiology Studies: DG Chest Port 1 View  Result Date: 07/23/2023 CLINICAL DATA:  82 year old female with shortness of breath and hemoptysis. Right upper lobe presume stage I lung cancer status post radiation treatment at the end of September. EXAM: PORTABLE CHEST 1 VIEW COMPARISON:  CTA chest 07/15/2023 and earlier. FINDINGS: Portable AP upright view at 0820 hours. Stable cardiomegaly and mediastinal contours. Visualized tracheal air column is within normal limits. Stable lung volumes. Improved right lung base ventilation since last month but new or increased reticulonodular opacity in the right upper lobe now. Underlying chronic pulmonary interstitial changes. No pneumothorax or pleural effusion. No acute osseous abnormality identified. Paucity of bowel gas. IMPRESSION: Cardiomegaly and chronic lung disease with improved right lung base ventilation. Increased reticular opacity  in the right upper lung might be radiation pneumonitis in this setting rather than acute infection or inflammation. Electronically Signed   By: Odessa Fleming M.D.   On: 07/23/2023 11:29        Scheduled Meds:  amLODipine  5 mg Oral Daily   vitamin C  500 mg Oral Daily   atorvastatin  80 mg Oral Q24H   vitamin B-12  500 mcg Oral Daily   dabigatran  150 mg Oral Q12H   dexamethasone  4 mg Oral Daily   donepezil  10 mg Oral QHS   flecainide  50 mg Oral BID   furosemide  40 mg Intravenous Daily   guaiFENesin  1,200 mg Oral BID   ipratropium-albuterol  3 mL Nebulization TID   levothyroxine  88 mcg Oral Q0600    loratadine  10 mg Oral Daily   melatonin  5 mg Oral QHS   venlafaxine XR  150 mg Oral Q breakfast   Continuous Infusions:   LOS: 7 days   Tresa Moore, MD [facility]   If 7PM-7AM, please contact night-coverage  07/23/2023, 1:58 PM

## 2023-07-23 NOTE — Consult Note (Signed)
PULMONOLOGY         Date: 07/23/2023,   MRN# [id number] [name] 12/17/1940     AdmissionWeight: 90.8 kg                 CurrentWeight: 90.8 kg  Referring provider: Dr Georgeann Oppenheim   CHIEF COMPLAINT:   Acute on chronic hypoxemic respiratory failure   HISTORY OF PRESENT ILLNESS   This is a pleasant 82 year old female with a history of asthma and COPD overlap syndrome with chronic hypoxemia utilizing supplemental oxygen at 2 L/min nocturnally.  She also has cardiac dysfunction with atrial fibrillation, previous history of CVA, prediabetes hypothyroidism dementia, essential hypertension dyslipidemia GERD, diverticulosis major depressive disorder and anxiety disorder.  She does have right upper lobe non-small cell carcinoma and has received radiation therapy for this.  On this admission she reported coughing up phlegm with blood-streaked expectoration nonmassive hemoptysis.  She reports that this has been going on for approximately 1 week with associated progressive dyspnea and wheezing.  While in the emergency room she also had an arrhythmia and received adenosine injection which remedied this issue.  She was initially treated for acute exacerbation of COPD with DuoNeb nebulizer therapy as well as IV Solu-Medrol.  She also had CT chest 07/15/23 performed which was negative for PE however did find bilateral pleural effusions worse on the right with associated compressive atelectasis and interstitial edema.  There is also interval increase in right paratracheal and right hilar adenopathy and absence of previously noted right upper lobe nodule post radiation therapy.  Due to persistent respiratory symptoms with ongoing cough and wheezing PCCM consultation was placed for further evaluation management.   PAST MEDICAL HISTORY   Past Medical History:  Diagnosis Date   Anxiety    Arthritis    "shoulders" (08/20/2014)   Asthma    Chronic lower back pain    Complication of anesthesia     "agitated & restless after knee replacement in 2008"   COPD (chronic obstructive pulmonary disease) (HCC)    DDD (degenerative disc disease), lumbar    Depression    Diabetes mellitus without complication (HCC)    Diverticulosis    Family history of adverse reaction to anesthesia    GERD (gastroesophageal reflux disease)    High cholesterol    Hypertension    Hypothyroidism    Memory loss    mild   On home oxygen therapy    "2L at night" (08/20/2014)   PAF (paroxysmal atrial fibrillation) (HCC)    Peripheral vascular disease (HCC)    Pre-diabetes    Shortness of breath dyspnea    with exertion   Stroke (HCC)    mild, no deficits     SURGICAL HISTORY   Past Surgical History:  Procedure Laterality Date   CARDIAC CATHETERIZATION  ~ 2007   EXCISIONAL HEMORRHOIDECTOMY  1960's   EYE SURGERY Bilateral    Cataract extraction with IOL   KNEE ARTHROSCOPY Left X 2 <2008   KNEE ARTHROSCOPY WITH SUBCHONDROPLASTY Right 04/19/2016   Procedure: KNEE ARTHROSCOPY WITH SUBCHONDROPLASTY, PARTIAL MENISCECTOMY;  Surgeon: Kennedy Bucker, MD;  Location: [facility] ORS;  Service: Orthopedics;  Laterality: Right;   LOOP RECORDER INSERTION N/A 01/02/2018   Procedure: LOOP RECORDER INSERTION;  Surgeon: Duke Salvia, MD;  Location: [facility] INVASIVE CV LAB;  Service: Cardiovascular;  Laterality: N/A;   LUMBAR LAMINECTOMY/DECOMPRESSION MICRODISCECTOMY N/A 02/06/2021   Procedure: Lumbar Three-Four, Lumbar Four-Five Laminectomy and Foraminotomy;  Surgeon: Barnett Abu, MD;  Location: [facility]  OR;  Service: Neurosurgery;  Laterality: N/A;   SHOULDER OPEN ROTATOR CUFF REPAIR Right 2001   TONSILLECTOMY  ~ 1950   TOTAL KNEE ARTHROPLASTY Left 2008   TOTAL KNEE ARTHROPLASTY Right 07/03/2016   Procedure: TOTAL KNEE ARTHROPLASTY;  Surgeon: Kennedy Bucker, MD;  Location: [facility] ORS;  Service: Orthopedics;  Laterality: Right;   TUBAL LIGATION  1973   VAGINAL HYSTERECTOMY  1974     FAMILY HISTORY   Family History  Problem  Relation Age of Onset   Diabetes Mother    Hypertension Mother    Lung cancer Mother    Congestive Heart Failure Father    Congestive Heart Failure Sister      SOCIAL HISTORY   Social History   Tobacco Use   Smoking status: Every Day    Current packs/day: 0.25    Average packs/day: 0.3 packs/day for 53.0 years (13.3 ttl pk-yrs)    Types: Cigarettes   Smokeless tobacco: Never   Tobacco comments:    2 cigarettes per day  Vaping Use   Vaping status: Never Used  Substance Use Topics   Alcohol use: No   Drug use: No     MEDICATIONS    Home Medication:  Current Outpatient Rx   Order #: [id number] Class: Normal   Order #: [id number] Class: Normal   Order #: [id number] Class: Normal   Order #: [id number] Class: Normal   Order #: [id number] Class: Normal    Current Medication:  Current Facility-Administered Medications:    acetaminophen (TYLENOL) tablet 650 mg, 650 mg, Oral, Q6H PRN, Amin, Ankit C, MD, 650 mg at 07/16/23 2115   amLODipine (NORVASC) tablet 5 mg, 5 mg, Oral, Daily, Chipper Herb, Ping T, MD, 5 mg at 07/23/23 0958   arformoterol (BROVANA) nebulizer solution 15 mcg, 15 mcg, Nebulization, BID, Georgeann Oppenheim, Sudheer B, MD, 15 mcg at 07/23/23 2440   ascorbic acid (VITAMIN C) tablet 500 mg, 500 mg, Oral, Daily, Amin, Ankit C, MD, 500 mg at 07/23/23 1001   atorvastatin (LIPITOR) tablet 80 mg, 80 mg, Oral, Q24H, Mikey College T, MD, 80 mg at 07/22/23 1849   cyanocobalamin (VITAMIN B12) tablet 500 mcg, 500 mcg, Oral, Daily, Amin, Ankit C, MD, 500 mcg at 07/23/23 1000   dabigatran (PRADAXA) capsule 150 mg, 150 mg, Oral, Q12H, Mikey College T, MD, 150 mg at 07/23/23 1005   diphenhydrAMINE (BENADRYL) capsule 25 mg, 25 mg, Oral, QHS PRN, Mikey College T, MD, 25 mg at 07/16/23 2115   donepezil (ARICEPT) tablet 10 mg, 10 mg, Oral, QHS, Mikey College T, MD, 10 mg at 07/22/23 2110   flecainide (TAMBOCOR) tablet 50 mg, 50 mg, Oral, BID, Mikey College T, MD, 50 mg at 07/23/23 1005   guaiFENesin (MUCINEX) 12  hr tablet 1,200 mg, 1,200 mg, Oral, BID, Mikey College T, MD, 1,200 mg at 07/23/23 1003   hydrALAZINE (APRESOLINE) injection 10 mg, 10 mg, Intravenous, Q4H PRN, Amin, Ankit C, MD   ipratropium-albuterol (DUONEB) 0.5-2.5 (3) MG/3ML nebulizer solution 3 mL, 3 mL, Nebulization, TID, Sreenath, Sudheer B, MD, 3 mL at 07/23/23 0754   levothyroxine (SYNTHROID) tablet 88 mcg, 88 mcg, Oral, Q0600, Mikey College T, MD, 88 mcg at 07/23/23 0540   loratadine (CLARITIN) tablet 10 mg, 10 mg, Oral, Daily, Chipper Herb, Ping T, MD, 10 mg at 07/23/23 1002   melatonin tablet 5 mg, 5 mg, Oral, QHS, Sreenath, Sudheer B, MD, 5 mg at 07/22/23 2110   methylPREDNISolone sodium succinate (SOLU-MEDROL) 40 mg/mL injection 40 mg, 40 mg, Intravenous, Daily, Georgeann Oppenheim, Jonelle Sports, MD,  40 mg at 07/23/23 1007   metoprolol tartrate (LOPRESSOR) injection 5 mg, 5 mg, Intravenous, Q4H PRN, Amin, Ankit C, MD   ondansetron (ZOFRAN) injection 4 mg, 4 mg, Intravenous, Q6H PRN, Amin, Ankit C, MD, 4 mg at 07/16/23 1055   ondansetron (ZOFRAN) tablet 4 mg, 4 mg, Oral, Q6H PRN **OR** [DISCONTINUED] ondansetron (ZOFRAN) injection 4 mg, 4 mg, Intravenous, Q6H PRN, Emeline General, MD   Oral care mouth rinse, 15 mL, Mouth Rinse, PRN, Sreenath, Sudheer B, MD   phenol (CHLORASEPTIC) mouth spray 1 spray, 1 spray, Mouth/Throat, PRN, Amin, Ankit C, MD, 1 spray at 07/16/23 1830   senna-docusate (Senokot-S) tablet 1 tablet, 1 tablet, Oral, QHS PRN, Amin, Ankit C, MD   venlafaxine XR (EFFEXOR-XR) 24 hr capsule 150 mg, 150 mg, Oral, Q breakfast, Chipper Herb, Ping T, MD, 150 mg at 07/23/23 1610    ALLERGIES   Trazodone and nefazodone and Heparin     REVIEW OF SYSTEMS    Review of Systems:  Gen:  Denies  fever, sweats, chills weigh loss  HEENT: Denies blurred vision, double vision, ear pain, eye pain, hearing loss, nose bleeds, sore throat Cardiac:  No dizziness, chest pain or heaviness, chest tightness,edema Resp:   reports dyspnea chronically  Gi: Denies  swallowing difficulty, stomach pain, nausea or vomiting, diarrhea, constipation, bowel incontinence Gu:  Denies bladder incontinence, burning urine Ext:   Denies Joint pain, stiffness or swelling Skin: Denies  skin rash, easy bruising or bleeding or hives Endoc:  Denies polyuria, polydipsia , polyphagia or weight change Psych:   Denies depression, insomnia or hallucinations   Other:  All other systems negative   VS: BP (!) 161/79 (BP Location: Right Arm)   Pulse 82   Temp 97.8 F (36.6 C) (Oral)   Resp 16   Ht 5\' 4"  (1.626 m)   Wt 90.8 kg   SpO2 94%   BMI 34.36 kg/m      PHYSICAL EXAM    GENERAL:NAD, no fevers, chills, no weakness no fatigue HEAD: Normocephalic, atraumatic.  EYES: Pupils equal, round, reactive to light. Extraocular muscles intact. No scleral icterus.  MOUTH: Moist mucosal membrane. Dentition intact. No abscess noted.  EAR, NOSE, THROAT: Clear without exudates. No external lesions.  NECK: Supple. No thyromegaly. No nodules. No JVD.  PULMONARY: decreased breath sounds with mild rhonchi worse at bases bilaterally.  CARDIOVASCULAR: S1 and S2. Regular rate and rhythm. No murmurs, rubs, or gallops. No edema. Pedal pulses 2+ bilaterally.  GASTROINTESTINAL: Soft, nontender, nondistended. No masses. Positive bowel sounds. No hepatosplenomegaly.  MUSCULOSKELETAL: No swelling, clubbing, or edema. Range of motion full in all extremities.  NEUROLOGIC: Cranial nerves II through XII are intact. No gross focal neurological deficits. Sensation intact. Reflexes intact.  SKIN: No ulceration, lesions, rashes, or cyanosis. Skin warm and dry. Turgor intact.  PSYCHIATRIC: Mood, affect within normal limits. The patient is awake, alert and oriented x 3. Insight, judgment intact.       IMAGING     Narrative & Impression  CLINICAL DATA:  Shortness of breath with cough for 1 week. Pulmonary embolism suspected, high probability. Recent completion of radiation therapy for lung  cancer.   EXAM: CT ANGIOGRAPHY CHEST WITH CONTRAST   TECHNIQUE: Multidetector CT imaging of the chest was performed using the standard protocol during bolus administration of intravenous contrast. Multiplanar CT image reconstructions and MIPs were obtained to evaluate the vascular anatomy.   RADIATION DOSE REDUCTION: This exam was performed according to the departmental dose-optimization program which  includes automated exposure control, adjustment of the mA and/or kV according to patient size and/or use of iterative reconstruction technique.   CONTRAST:  50mL OMNIPAQUE IOHEXOL 350 MG/ML SOLN   COMPARISON:  Radiographs 07/15/2023. Chest CT 03/11/2023 and PET-CT 04/01/2023.   FINDINGS: Technical note: Despite efforts by the technologist and patient, mild motion artifact is present on today's exam and could not be eliminated. This reduces exam sensitivity and specificity.   Cardiovascular: The pulmonary arteries are well opacified with contrast to the level of the segmental branches. There is no evidence of acute pulmonary embolism. There is central enlargement of the pulmonary arteries. Atherosclerosis of the aorta, great vessels and coronary arteries without acute systemic arterial abnormalities. Probable calcifications of the aortic valve and mitral annulus. The heart is mildly enlarged. No significant pericardial fluid.   Mediastinum/Nodes: Interval increase in right paratracheal and right hilar adenopathy. There is a right paratracheal node measuring 1.9 cm on image 44/4 and a right hilar node measuring 1.5 cm on image 58/4. No axillary or contralateral mediastinal adenopathy. The thyroid gland, trachea and esophagus demonstrate no significant findings.   Lungs/Pleura: New small right-greater-than-left pleural effusions with extension into the superior aspect of the right major fissure. Previously demonstrated right upper lobe pulmonary nodule which was hypermetabolic  on PET-CT is not well seen, partly obscured by adjacent parenchymal opacity. No new or enlarging pulmonary nodules are identified. Underlying centrilobular and paraseptal emphysema with diffuse subpleural reticulation, as before.   Upper abdomen: The visualized upper abdomen appears stable, without acute findings.   Musculoskeletal/Chest wall: There is no chest wall mass or suspicious osseous finding. Unchanged mild chronic L1 compression deformity.   Unless specific follow-up recommendations are mentioned in the findings or impression sections, no imaging follow-up of any mentioned incidental findings is recommended.   Review of the MIP images confirms the above findings.   IMPRESSION: 1. No evidence of acute pulmonary embolism or other acute vascular findings. 2. New small right-greater-than-left pleural effusions with extension into the superior aspect of the right major fissure. 3. Interval increase in right paratracheal and right hilar adenopathy. Although potentially reactive, findings are suspicious for metastatic disease. Recommend attention on follow-up. 4. Previously demonstrated right upper lobe pulmonary nodule which was hypermetabolic on PET-CT is not well seen, partly obscured by adjacent parenchymal opacity attributed to interval radiation therapy. No new or enlarging pulmonary nodules are identified. 5. Aortic Atherosclerosis (ICD10-I70.0) and Emphysema (ICD10-J43.9).     Electronically Signed   By: Carey Bullocks M.D.   ASSESSMENT/PLAN    Acute on Chronic hypoxemic respiratory failure -due to community acquired pneumonia with acute on chronic CHF and COPD exacerbation with partial component from radiation pneumonitis.  -she would benefit from reduced steroid dosing, will step down to dexamethasone 4mg  at this time - will diurese the effusions noted on CXR with lasix for now, strict I&O.  Renal function is within reference range -adding zithromax 250 po  daily for prophylaxis while treating COPD/Asthma -crp trend for inflammation   Bilateral pleural effusions   -mild to moderate on right and mild on left - lasix 40 IV daily    Bibasilar atelectasis  - IS currently at 650cc/breath  - would like to add recruitment with metaneb and albuterol TID to treat atelectatic segments   - wean O2 as able with goal spO2 88%   Non -massive hemoptysis   - almost resolved now , continue zithromax and diuresis  -patient on pradaxa   Acute exacerbation of Asthma and  COPD   Duoneb TID , no need for pulmicort or brovana , patient with AF -continue metaneb, IS, PT/OT       Thank you for allowing me to participate in the care of this patient.  Patient/Family are satisfied with care plan and all questions have been answered.    Provider disclosure: Patient with at least one acute or chronic illness or injury that poses a threat to life or bodily function and is being managed actively during this encounter.  All of the below services have been performed independently by signing provider:  review of prior documentation from internal and or external health records.  Review of previous and current lab results.  Interview and comprehensive assessment during patient visit today. Review of current and previous chest radiographs/CT scans. Discussion of management and test interpretation with health care team and patient/family.   This document was prepared using Dragon voice recognition software and may include unintentional dictation errors.     Vida Rigger, M.D.  Division of Pulmonary & Critical Care Medicine

## 2023-07-23 NOTE — Plan of Care (Signed)

## 2023-07-23 NOTE — Progress Notes (Signed)
Occupational Therapy Treatment Patient Details Name: [name] Douglas MRN: [id number] DOB: 1941-03-28 Today's Date: 07/23/2023   History of present illness 82 y.o. female presented with worsening of productive cough, wheezing, SOB and coughing up streaks of blood. CT chest findings: no PE, possible lymphadenopathy suspicious for metastatic disease.  Bilateral small pleural effusion. PMHx: stroke, PAF/paroxysmal SVT, recently diagnosed RUL non-small cell carcinoma s/p radiation therapy x 3 weeks in Sept, COPD, HTN, IIDM, chronic HFpEF.   OT comments  Pt is supine in bed on arrival. Pleasant and agreeable to OT session. She denies pain. Pt performed bed mobility MOD I. She performed STS and mobility to the bathroom and back using no AD with SUP. Pt on 2L and dropped to 86% at lowest with quick increase to [facility] with rest and PLB. LB dressing performed with SUP, as well as hand hygiene at sink. Extensive edu provided on pacing, incorporating rest breaks, using PLB and other ECS to maximize her safety with ADLs/mobility and prevent overexertion/SOB. She verbalized understanding. Declined getting in chair.  Pt returned to bed with all needs in place and will cont to require skilled acute OT services to maximize her safety and IND to return to PLOF.       If plan is discharge home, recommend the following:  A little help with walking and/or transfers;A little help with bathing/dressing/bathroom;Assistance with cooking/housework;Assist for transportation;Help with stairs or ramp for entrance   Equipment Recommendations  None recommended by OT    Recommendations for Other Services      Precautions / Restrictions Precautions Precautions: Fall Restrictions Weight Bearing Restrictions: No       Mobility Bed Mobility Overal bed mobility: Modified Independent                  Transfers Overall transfer level: Needs assistance Equipment used: None Transfers: Sit to/from Stand Sit to Stand:  Supervision           General transfer comment: SUP for STS and mobility with no AD to bathroom and back     Balance Overall balance assessment: Needs assistance Sitting-balance support: No upper extremity supported, Feet supported Sitting balance-Leahy Scale: Good     Standing balance support: Bilateral upper extremity supported, During functional activity Standing balance-Leahy Scale: Good Standing balance comment: no LOB, no AD use                           ADL either performed or assessed with clinical judgement   ADL Overall ADL's : Needs assistance/impaired                     Lower Body Dressing: Sit to/from stand;Sitting/lateral leans;Supervision/safety   Toilet Transfer: Supervision/safety;Rolling walker (2 wheels);Grab Pharmacist, community and Hygiene: Sitting/lateral lean;Sit to/from stand;Supervision/safety         General ADL Comments: SUP for all ADLs performed during session as well as mobility from bed <> bathroom with no AD on 2L    Extremity/Trunk Assessment Upper Extremity Assessment Upper Extremity Assessment: Overall WFL for tasks assessed   Lower Extremity Assessment Lower Extremity Assessment: Generalized weakness        Vision       Perception     Praxis      Cognition Arousal: Alert Behavior During Therapy: WFL for tasks assessed/performed Overall Cognitive Status: Within Functional Limits for tasks assessed  General Comments: AOx4. Pt pleasant and willing to participate in OT session.        Exercises Other Exercises Other Exercises: Extensive edu on PLB, pacing, taking rest breaks and ECS to use during ADL performance and mobility to prevent overexertion/increased SOB.    Shoulder Instructions       General Comments 2L 02 dropped to 86% lowest and increased quickly with breathing and rest; HR 100 max    Pertinent Vitals/  Pain       Pain Assessment Pain Assessment: No/denies pain  Home Living                                          Prior Functioning/Environment              Frequency  Min 1X/week        Progress Toward Goals  OT Goals(current goals can now be found in the care plan section)  Progress towards OT goals: Progressing toward goals  Acute Rehab OT Goals Patient Stated Goal: improve breathing to return home OT Goal Formulation: With patient Time For Goal Achievement: 07/30/23 Potential to Achieve Goals: Good  Plan      Co-evaluation                 AM-PAC OT "6 Clicks" Daily Activity     Outcome Measure   Help from another person eating meals?: None Help from another person taking care of personal grooming?: None Help from another person toileting, which includes using toliet, bedpan, or urinal?: None Help from another person bathing (including washing, rinsing, drying)?: A Little Help from another person to put on and taking off regular upper body clothing?: None Help from another person to put on and taking off regular lower body clothing?: None 6 Click Score: 23    End of Session Equipment Utilized During Treatment: Oxygen  OT Visit Diagnosis: Other abnormalities of gait and mobility (R26.89)   Activity Tolerance Patient tolerated treatment well;Patient limited by fatigue   Patient Left in bed;with bed alarm set;with call bell/phone within reach   Nurse Communication          Time: 1610-9604 OT Time Calculation (min): 11 min  Charges: OT General Charges $OT Visit: 1 Visit OT Treatments $Self Care/Home Management : 8-22 mins  Koby Pickup, OTR/L  07/23/23, 12:50 PM   [name] Douglas E Pamela Intrieri 07/23/2023, 12:48 PM

## 2023-07-24 ENCOUNTER — Inpatient Hospital Stay: Payer: Medicare Other

## 2023-07-24 DIAGNOSIS — R042 Hemoptysis: Secondary | ICD-10-CM | POA: Diagnosis not present

## 2023-07-24 LAB — BASIC METABOLIC PANEL
Anion gap: 13 (ref 5–15)
BUN: 35 mg/dL — ABNORMAL HIGH (ref 8–23)
CO2: 25 mmol/L (ref 22–32)
Calcium: 8.8 mg/dL — ABNORMAL LOW (ref 8.9–10.3)
Chloride: 99 mmol/L (ref 98–111)
Creatinine, Ser: 1.06 mg/dL — ABNORMAL HIGH (ref 0.44–1.00)
GFR, Estimated: 52 mL/min — ABNORMAL LOW (ref >60)
Glucose, Bld: 180 mg/dL — ABNORMAL HIGH (ref 70–99)
Potassium: 4.5 mmol/L (ref 3.5–5.1)
Sodium: 137 mmol/L (ref 135–145)

## 2023-07-24 LAB — MAGNESIUM: Magnesium: 2.3 mg/dL (ref 1.7–2.4)

## 2023-07-24 MED ORDER — AZITHROMYCIN 250 MG PO TABS
250.0000 mg | ORAL_TABLET | Freq: Every day | ORAL | Status: DC
  Administered 2023-07-24 – 2023-07-27 (×4): 250 mg via ORAL
  Filled 2023-07-24 (×4): qty 1

## 2023-07-24 MED ORDER — DEXAMETHASONE 0.5 MG PO TABS
2.0000 mg | ORAL_TABLET | Freq: Every day | ORAL | Status: DC
  Administered 2023-07-25: 2 mg via ORAL
  Filled 2023-07-24: qty 4

## 2023-07-24 NOTE — Progress Notes (Signed)
Physical Therapy Treatment Patient Details Name: [name] Douglas MRN: [id number] DOB: [birth year] Today's Date: 07/24/2023   History of Present Illness 82 y.o. female presented with worsening of productive cough, wheezing, SOB and coughing up streaks of blood. CT chest findings: no PE, possible lymphadenopathy suspicious for metastatic disease.  Bilateral small pleural effusion. PMHx: stroke, PAF/paroxysmal SVT, recently diagnosed RUL non-small cell carcinoma s/p radiation therapy x 3 weeks in Sept, COPD, HTN, IIDM, chronic HFpEF.    PT Comments  Pt extremely pleasant and motivated with PT session.  She showed great confidence with bed mobility, transfers and ambulation with RW.  She was on 2L t/o the session with SpO2 mid 90s at rest.  During ambulation pt was loquacious and seemed to drive herself to DOE and needed encouragement to stop and focus on nasal and pursed lip breathing, after each ~100 ft loop her SpO2 would drop to mid 80s and she would need a 1-2 minute standing rest break to recover; pt determined to do as much as she could.  She endorses only minimal subjective fatigue, despite increasing DOE with increasing effort.  Pt will benefit from continued PT to address functional limitations.    If plan is discharge home, recommend the following: A little help with walking and/or transfers;Assist for transportation;Help with stairs or ramp for entrance   Can travel by private vehicle        Equipment Recommendations  None recommended by PT    Recommendations for Other Services       Precautions / Restrictions Precautions Precautions: Fall Restrictions Weight Bearing Restrictions: No     Mobility  Bed Mobility Overal bed mobility: Modified Independent                  Transfers Overall transfer level: Modified independent Equipment used: None Transfers: Sit to/from Stand Sit to Stand: Supervision           General transfer comment: able to rise to standing w/o  AD, minimally guarded.  Increased confidence STS with RW.    Ambulation/Gait Ambulation/Gait assistance: Supervision, Contact guard assist Gait Distance (Feet): 350 Feet Assistive device: Rolling walker (2 wheels) Gait Pattern/deviations: Step-through pattern       General Gait Details: Pt amb with light use of FWW, supervisonly. VC necessary for directional cuing, O2 management and focused/pursed lip breathing.  Pt tending to talk to the point of DOE and when cued to rest typically had dropped to mid 80s and needing 1-2 minute standing rest break to return to low 90s with focused breathing   Stairs             Wheelchair Mobility     Tilt Bed    Modified Rankin (Stroke Patients Only)       Balance Overall balance assessment: Needs assistance Sitting-balance support: No upper extremity supported, Feet supported Sitting balance-Leahy Scale: Good     Standing balance support: Bilateral upper extremity supported, During functional activity Standing balance-Leahy Scale: Good                              Cognition Arousal: Alert Behavior During Therapy: WFL for tasks assessed/performed Overall Cognitive Status: Within Functional Limits for tasks assessed                                 General Comments: AOx4. Pt pleasant and willing to participate  in OT session.        Exercises      General Comments General comments (skin integrity, edema, etc.): 2L O2 t/o session, confident with ambulation; DOE exacerbated by her desire to speak essentially the entire session      Pertinent Vitals/Pain Pain Assessment Pain Assessment: No/denies pain    Home Living                          Prior Function            PT Goals (current goals can now be found in the care plan section) Progress towards PT goals: Progressing toward goals    Frequency    Min 1X/week      PT Plan      Co-evaluation              AM-PAC  PT "6 Clicks" Mobility   Outcome Measure  Help needed turning from your back to your side while in a flat bed without using bedrails?: None Help needed moving from lying on your back to sitting on the side of a flat bed without using bedrails?: None Help needed moving to and from a bed to a chair (including a wheelchair)?: A Little Help needed standing up from a chair using your arms (e.g., wheelchair or bedside chair)?: A Little Help needed to walk in hospital room?: A Little Help needed climbing 3-5 steps with a railing? : A Little 6 Click Score: 20    End of Session Equipment Utilized During Treatment: Oxygen (2L) Activity Tolerance: Patient tolerated treatment well;Patient limited by fatigue Patient left: in bed;with call bell/phone within reach Nurse Communication: Mobility status PT Visit Diagnosis: Unsteadiness on feet (R26.81);Other abnormalities of gait and mobility (R26.89);Difficulty in walking, not elsewhere classified (R26.2);Muscle weakness (generalized) (M62.81)     Time: 2440-1027 PT Time Calculation (min) (ACUTE ONLY): 16 min  Charges:    $Gait Training: 8-22 mins PT General Charges $$ ACUTE PT VISIT: 1 Visit                     Malachi Pro, DPT 07/24/2023, 5:32 PM

## 2023-07-24 NOTE — Progress Notes (Signed)
PULMONOLOGY         Date: 07/24/2023,   MRN# [id number] [name] Douglas 1940/10/18     AdmissionWeight: 90.8 kg                 CurrentWeight: 90.8 kg  Referring provider: Dr Georgeann Oppenheim   CHIEF COMPLAINT:   Acute on chronic hypoxemic respiratory failure   HISTORY OF PRESENT ILLNESS   This is a pleasant 82 year old female with a history of asthma and COPD overlap syndrome with chronic hypoxemia utilizing supplemental oxygen at 2 L/min nocturnally.  She also has cardiac dysfunction with atrial fibrillation, previous history of CVA, prediabetes hypothyroidism dementia, essential hypertension dyslipidemia GERD, diverticulosis major depressive disorder and anxiety disorder.  She does have right upper lobe non-small cell carcinoma and has received radiation therapy for this.  On this admission she reported coughing up phlegm with blood-streaked expectoration nonmassive hemoptysis.  She reports that this has been going on for approximately 1 week with associated progressive dyspnea and wheezing.  While in the emergency room she also had an arrhythmia and received adenosine injection which remedied this issue.  She was initially treated for acute exacerbation of COPD with DuoNeb nebulizer therapy as well as IV Solu-Medrol.  She also had CT chest 07/15/23 performed which was negative for PE however did find bilateral pleural effusions worse on the right with associated compressive atelectasis and interstitial edema.  There is also interval increase in right paratracheal and right hilar adenopathy and absence of previously noted right upper lobe nodule post radiation therapy.  Due to persistent respiratory symptoms with ongoing cough and wheezing PCCM consultation was placed for further evaluation management.   07/24/23- patient still wheezing with rhonchi bilaterally.  She reports improvement and asking for dc home.  She is on 2L/min baseline O2 req.  She diuresed well and bmp today consistent  with contraction. We dcd lasix and reduced steroids to decadron 2mg .  She continues to use metaneb. She will likely need low dose steroids for few weeks due to pneumonitis post radiation.  She is using IS, improved from tidal volume to in past 24h.   PAST MEDICAL HISTORY   Past Medical History:  Diagnosis Date   Anxiety    Arthritis    "shoulders" (08/20/2014)   Asthma    Chronic lower back pain    Complication of anesthesia    "agitated & restless after knee replacement in 2008"   COPD (chronic obstructive pulmonary disease) (HCC)    DDD (degenerative disc disease), lumbar    Depression    Diabetes mellitus without complication (HCC)    Diverticulosis    Family history of adverse reaction to anesthesia    GERD (gastroesophageal reflux disease)    High cholesterol    Hypertension    Hypothyroidism    Memory loss    mild   On home oxygen therapy    "2L at night" (08/20/2014)   PAF (paroxysmal atrial fibrillation) (HCC)    Peripheral vascular disease (HCC)    Pre-diabetes    Shortness of breath dyspnea    with exertion   Stroke (HCC)    mild, no deficits     SURGICAL HISTORY   Past Surgical History:  Procedure Laterality Date   CARDIAC CATHETERIZATION  ~ 2007   EXCISIONAL HEMORRHOIDECTOMY  1960's   EYE SURGERY Bilateral    Cataract extraction with IOL   KNEE ARTHROSCOPY Left X 2 <2008   KNEE ARTHROSCOPY WITH SUBCHONDROPLASTY Right  04/19/2016   Procedure: KNEE ARTHROSCOPY WITH SUBCHONDROPLASTY, PARTIAL MENISCECTOMY;  Surgeon: Kennedy Bucker, MD;  Location: [facility] ORS;  Service: Orthopedics;  Laterality: Right;   LOOP RECORDER INSERTION N/A 01/02/2018   Procedure: LOOP RECORDER INSERTION;  Surgeon: Duke Salvia, MD;  Location: [facility] INVASIVE CV LAB;  Service: Cardiovascular;  Laterality: N/A;   LUMBAR LAMINECTOMY/DECOMPRESSION MICRODISCECTOMY N/A 02/06/2021   Procedure: Lumbar Three-Four, Lumbar Four-Five Laminectomy and Foraminotomy;  Surgeon: Barnett Abu, MD;   Location: [facility] OR;  Service: Neurosurgery;  Laterality: N/A;   SHOULDER OPEN ROTATOR CUFF REPAIR Right 2001   TONSILLECTOMY  ~ 1950   TOTAL KNEE ARTHROPLASTY Left 2008   TOTAL KNEE ARTHROPLASTY Right 07/03/2016   Procedure: TOTAL KNEE ARTHROPLASTY;  Surgeon: Kennedy Bucker, MD;  Location: [facility] ORS;  Service: Orthopedics;  Laterality: Right;   TUBAL LIGATION  1973   VAGINAL HYSTERECTOMY  1974     FAMILY HISTORY   Family History  Problem Relation Age of Onset   Diabetes Mother    Hypertension Mother    Lung cancer Mother    Congestive Heart Failure Father    Congestive Heart Failure Sister      SOCIAL HISTORY   Social History   Tobacco Use   Smoking status: Every Day    Current packs/day: 0.25    Average packs/day: 0.3 packs/day for 53.0 years (13.3 ttl pk-yrs)    Types: Cigarettes   Smokeless tobacco: Never   Tobacco comments:    2 cigarettes per day  Vaping Use   Vaping status: Never Used  Substance Use Topics   Alcohol use: No   Drug use: No     MEDICATIONS    Home Medication:  Current Outpatient Rx   Order #: [id number] Class: Normal   Order #: [id number] Class: Normal   Order #: [id number] Class: Normal   Order #: [id number] Class: Normal   Order #: [id number] Class: Normal    Current Medication:  Current Facility-Administered Medications:    acetaminophen (TYLENOL) tablet 650 mg, 650 mg, Oral, Q6H PRN, Amin, Ankit C, MD, 650 mg at 07/16/23 2115   amLODipine (NORVASC) tablet 5 mg, 5 mg, Oral, Daily, Chipper Herb, Ping T, MD, 5 mg at 07/23/23 3557   ascorbic acid (VITAMIN C) tablet 500 mg, 500 mg, Oral, Daily, Amin, Ankit C, MD, 500 mg at 07/23/23 1001   atorvastatin (LIPITOR) tablet 80 mg, 80 mg, Oral, Q24H, Mikey College T, MD, 80 mg at 07/23/23 1758   cyanocobalamin (VITAMIN B12) tablet 500 mcg, 500 mcg, Oral, Daily, Amin, Ankit C, MD, 500 mcg at 07/23/23 1000   dabigatran (PRADAXA) capsule 150 mg, 150 mg, Oral, Q12H, Mikey College T, MD, 150 mg at 07/23/23 2212    dexamethasone (DECADRON) tablet 4 mg, 4 mg, Oral, Daily, Marsden Zaino, MD, 4 mg at 07/23/23 1346   donepezil (ARICEPT) tablet 10 mg, 10 mg, Oral, QHS, Mikey College T, MD, 10 mg at 07/23/23 2211   flecainide (TAMBOCOR) tablet 50 mg, 50 mg, Oral, BID, Mikey College T, MD, 50 mg at 07/23/23 2212   furosemide (LASIX) injection 40 mg, 40 mg, Intravenous, Daily, Karna Christmas, Simmone Cape, MD, 40 mg at 07/23/23 1345   guaiFENesin (MUCINEX) 12 hr tablet 1,200 mg, 1,200 mg, Oral, BID, Mikey College T, MD, 1,200 mg at 07/23/23 2211   hydrALAZINE (APRESOLINE) injection 10 mg, 10 mg, Intravenous, Q4H PRN, Amin, Ankit C, MD   ipratropium-albuterol (DUONEB) 0.5-2.5 (3) MG/3ML nebulizer solution 3 mL, 3 mL, Nebulization, TID, Georgeann Oppenheim, Sudheer B, MD, 3 mL at 07/24/23 [phone]  levothyroxine (SYNTHROID) tablet 88 mcg, 88 mcg, Oral, Q0600, Emeline General, MD, 88 mcg at 07/24/23 0618   loratadine (CLARITIN) tablet 10 mg, 10 mg, Oral, Daily, Mikey College T, MD, 10 mg at 07/23/23 1002   melatonin tablet 5 mg, 5 mg, Oral, QHS, Sreenath, Sudheer B, MD, 5 mg at 07/23/23 2211   metoprolol tartrate (LOPRESSOR) injection 5 mg, 5 mg, Intravenous, Q4H PRN, Amin, Ankit C, MD   ondansetron (ZOFRAN) injection 4 mg, 4 mg, Intravenous, Q6H PRN, Amin, Ankit C, MD, 4 mg at 07/16/23 1055   ondansetron (ZOFRAN) tablet 4 mg, 4 mg, Oral, Q6H PRN **OR** [DISCONTINUED] ondansetron (ZOFRAN) injection 4 mg, 4 mg, Intravenous, Q6H PRN, Emeline General, MD   Oral care mouth rinse, 15 mL, Mouth Rinse, PRN, Sreenath, Sudheer B, MD   phenol (CHLORASEPTIC) mouth spray 1 spray, 1 spray, Mouth/Throat, PRN, Amin, Ankit C, MD, 1 spray at 07/16/23 1830   senna-docusate (Senokot-S) tablet 1 tablet, 1 tablet, Oral, QHS PRN, Amin, Ankit C, MD   venlafaxine XR (EFFEXOR-XR) 24 hr capsule 150 mg, 150 mg, Oral, Q breakfast, Chipper Herb, Ping T, MD, 150 mg at 07/23/23 5638    ALLERGIES   Trazodone and nefazodone and Heparin     REVIEW OF SYSTEMS    Review of  Systems:  Gen:  Denies  fever, sweats, chills weigh loss  HEENT: Denies blurred vision, double vision, ear pain, eye pain, hearing loss, nose bleeds, sore throat Cardiac:  No dizziness, chest pain or heaviness, chest tightness,edema Resp:   reports dyspnea chronically  Gi: Denies swallowing difficulty, stomach pain, nausea or vomiting, diarrhea, constipation, bowel incontinence Gu:  Denies bladder incontinence, burning urine Ext:   Denies Joint pain, stiffness or swelling Skin: Denies  skin rash, easy bruising or bleeding or hives Endoc:  Denies polyuria, polydipsia , polyphagia or weight change Psych:   Denies depression, insomnia or hallucinations   Other:  All other systems negative   VS: BP (!) 180/68 (BP Location: Right Arm)   Pulse 79   Temp 97.9 F (36.6 C) (Oral)   Resp 19   Ht 5\' 4"  (1.626 m)   Wt 90.8 kg   SpO2 98%   BMI 34.36 kg/m      PHYSICAL EXAM    GENERAL:NAD, no fevers, chills, no weakness no fatigue HEAD: Normocephalic, atraumatic.  EYES: Pupils equal, round, reactive to light. Extraocular muscles intact. No scleral icterus.  MOUTH: Moist mucosal membrane. Dentition intact. No abscess noted.  EAR, NOSE, THROAT: Clear without exudates. No external lesions.  NECK: Supple. No thyromegaly. No nodules. No JVD.  PULMONARY: decreased breath sounds with mild rhonchi worse at bases bilaterally.  CARDIOVASCULAR: S1 and S2. Regular rate and rhythm. No murmurs, rubs, or gallops. No edema. Pedal pulses 2+ bilaterally.  GASTROINTESTINAL: Soft, nontender, nondistended. No masses. Positive bowel sounds. No hepatosplenomegaly.  MUSCULOSKELETAL: No swelling, clubbing, or edema. Range of motion full in all extremities.  NEUROLOGIC: Cranial nerves II through XII are intact. No gross focal neurological deficits. Sensation intact. Reflexes intact.  SKIN: No ulceration, lesions, rashes, or cyanosis. Skin warm and dry. Turgor intact.  PSYCHIATRIC: Mood, affect within normal  limits. The patient is awake, alert and oriented x 3. Insight, judgment intact.       IMAGING     Narrative & Impression  CLINICAL DATA:  Shortness of breath with cough for 1 week. Pulmonary embolism suspected, high probability. Recent completion of radiation therapy for lung cancer.   EXAM: CT ANGIOGRAPHY CHEST  WITH CONTRAST   TECHNIQUE: Multidetector CT imaging of the chest was performed using the standard protocol during bolus administration of intravenous contrast. Multiplanar CT image reconstructions and MIPs were obtained to evaluate the vascular anatomy.   RADIATION DOSE REDUCTION: This exam was performed according to the departmental dose-optimization program which includes automated exposure control, adjustment of the mA and/or kV according to patient size and/or use of iterative reconstruction technique.   CONTRAST:  50mL OMNIPAQUE IOHEXOL 350 MG/ML SOLN   COMPARISON:  Radiographs 07/15/2023. Chest CT 03/11/2023 and PET-CT 04/01/2023.   FINDINGS: Technical note: Despite efforts by the technologist and patient, mild motion artifact is present on today's exam and could not be eliminated. This reduces exam sensitivity and specificity.   Cardiovascular: The pulmonary arteries are well opacified with contrast to the level of the segmental branches. There is no evidence of acute pulmonary embolism. There is central enlargement of the pulmonary arteries. Atherosclerosis of the aorta, great vessels and coronary arteries without acute systemic arterial abnormalities. Probable calcifications of the aortic valve and mitral annulus. The heart is mildly enlarged. No significant pericardial fluid.   Mediastinum/Nodes: Interval increase in right paratracheal and right hilar adenopathy. There is a right paratracheal node measuring 1.9 cm on image 44/4 and a right hilar node measuring 1.5 cm on image 58/4. No axillary or contralateral mediastinal adenopathy. The thyroid  gland, trachea and esophagus demonstrate no significant findings.   Lungs/Pleura: New small right-greater-than-left pleural effusions with extension into the superior aspect of the right major fissure. Previously demonstrated right upper lobe pulmonary nodule which was hypermetabolic on PET-CT is not well seen, partly obscured by adjacent parenchymal opacity. No new or enlarging pulmonary nodules are identified. Underlying centrilobular and paraseptal emphysema with diffuse subpleural reticulation, as before.   Upper abdomen: The visualized upper abdomen appears stable, without acute findings.   Musculoskeletal/Chest wall: There is no chest wall mass or suspicious osseous finding. Unchanged mild chronic L1 compression deformity.   Unless specific follow-up recommendations are mentioned in the findings or impression sections, no imaging follow-up of any mentioned incidental findings is recommended.   Review of the MIP images confirms the above findings.   IMPRESSION: 1. No evidence of acute pulmonary embolism or other acute vascular findings. 2. New small right-greater-than-left pleural effusions with extension into the superior aspect of the right major fissure. 3. Interval increase in right paratracheal and right hilar adenopathy. Although potentially reactive, findings are suspicious for metastatic disease. Recommend attention on follow-up. 4. Previously demonstrated right upper lobe pulmonary nodule which was hypermetabolic on PET-CT is not well seen, partly obscured by adjacent parenchymal opacity attributed to interval radiation therapy. No new or enlarging pulmonary nodules are identified. 5. Aortic Atherosclerosis (ICD10-I70.0) and Emphysema (ICD10-J43.9).     Electronically Signed   By: Carey Bullocks M.D.   ASSESSMENT/PLAN    Acute on Chronic hypoxemic respiratory failure -due to community acquired pneumonia with acute on chronic CHF and COPD exacerbation with  partial component from radiation pneumonitis.  -she would benefit from reduced steroid dosing, will step down to dexamethasone 2mg  - diuresed >1L now improved with mild AKI will dc lasix -adding zithromax 250 po daily for prophylaxis while treating COPD/Asthma -crp is flat    Bilateral pleural effusions   -mild to moderate on right and mild on left -S/P lasix 40 IV    Bibasilar atelectasis  - IS currently at 650cc/breath>>>1071mL  - would like to add recruitment with metaneb and albuterol TID to treat atelectatic segments   -  wean O2 as able with goal spO2 88%   Non -massive hemoptysis   - almost resolved now , continue zithromax and diuresis  -patient on pradaxa   Acute exacerbation of Asthma and COPD   Duoneb TID , no need for pulmicort or brovana , patient with AF -continue metaneb, IS, PT/OT       Thank you for allowing me to participate in the care of this patient.  Patient/Family are satisfied with care plan and all questions have been answered.    Provider disclosure: Patient with at least one acute or chronic illness or injury that poses a threat to life or bodily function and is being managed actively during this encounter.  All of the below services have been performed independently by signing provider:  review of prior documentation from internal and or external health records.  Review of previous and current lab results.  Interview and comprehensive assessment during patient visit today. Review of current and previous chest radiographs/CT scans. Discussion of management and test interpretation with health care team and patient/family.   This document was prepared using Dragon voice recognition software and may include unintentional dictation errors.     Vida Rigger, M.D.  Division of Pulmonary & Critical Care Medicine

## 2023-07-24 NOTE — Progress Notes (Signed)
PROGRESS NOTE    [name] Douglas  KGM:[id number] DOB: 1941-08-10 DOA: 07/15/2023 PCP: Mick Sell, MD  [id number]  LOS: 8 days   Brief hospital course:   Assessment & Plan: 82 year old with history of stroke on Pradaxa, P A-fib/SVT, recently diagnosed RUL lung small cell lung cancer status post radiation in September, COPD, HTN, DM2, CHF with preserved EF admitted for shortness of breath. Initially found to be in SVT broke with adenosine. CT chest negative for PE but possible lymphadenopathy from mets. She also reported of hemoptysis. Currently being treated for bronchitis/pneumonia with COPD exacerbation. Hemoptysis appears to have subsided for now. CT scan showing possible progression of malignancy therefore notified her oncology team.    Acute on chronic hypoxic respiratory failure Hemoptysis Post-radiation pneumonitis Baseline 2L O2 I suspect combination of COPD exacerbation/pneumonitis and worsening malignancy.  Hemoptysis from persistent coughing and being on Pradaxa.  CT scan does show concerning for pneumonia but possibly worsening of malignancy therefore I have notified Dr. Carmina Miller from radiation oncology to possibly repeat scans in the near future, he is planning of it in January.  Family aware and will follow up with rad onc post dc --will need low-dose steroid for a few weeks, per pulm   Acute COPD exacerbation with concerns of bronchitis/pneumonia --persistent wheezing.  Received 8 days of solumedrol then transitioned to oral decadron. --pulm consulted Plan: --taper decadron to 2 mg daily --start azithromycin   Paroxysmal SVT/PAF -Triggered by hypoxia and COPD exacerbation.  Initially in SVT requiring adenosine.   --cont flecainide and pradaxa Outpatient follow-up with cardiology/EP   Elevated troponins --100's and down trended.  Likely demand ischemia.  EKG is nonischemic.     Bilateral pleural effusion -Patient appears to be euvolemic, unclear  nature of pleural effusion.  Slightly worse.  Possibly secondary to atelectasis versus congestive heart failure.   --s/p IV lasix 40 mg x2 doses   AKI Baseline creatinine 0.9, admission 1.44.  Now resolved   RUL non-small cell lung cancer -Status post radiation therapy    DVT prophylaxis: [id number]  Code Status: Full code  Family Communication:  Level of care: Telemetry Medical Dispo:   The patient is from: home Anticipated d/c is to: home Anticipated d/c date is: 1-2 days   Subjective and Interval History:  Pt felt she had sputum that she couldn't bring up.  Reported urine output not increased from baseline.     Objective: Vitals:   07/24/23 0758 07/24/23 0848 07/24/23 1300 07/24/23 1724  BP:  136/70  (!) 168/48  Pulse:  84  92  Resp:  16  19  Temp:  98.1 F (36.7 C)  98.4 F (36.9 C)  TempSrc:  Oral  Oral  SpO2: 98% 91% 91% 96%  Weight:      Height:        Intake/Output Summary (Last 24 hours) at 07/24/2023 1940 Last data filed at 07/24/2023 1725 Gross per 24 hour  Intake 600 ml  Output 250 ml  Net 350 ml   Filed Weights   07/15/23 0953  Weight: 90.8 kg    Examination:   Constitutional: NAD, AAOx3 HEENT: conjunctivae and lids normal, EOMI CV: No cyanosis.   RESP: normal respiratory effort, wheezing, on 2L Neuro: II - XII grossly intact.   Psych: Normal mood and affect.  Appropriate judgement and reason   Data Reviewed: I have personally reviewed labs and imaging studies  Time spent: 50 minutes  Darlin Priestly, MD [facility] If 7PM-7AM, please  contact night-coverage 07/24/2023, 7:40 PM

## 2023-07-24 NOTE — Plan of Care (Signed)
  Problem: Nutrition: Goal: Adequate nutrition will be maintained Outcome: Progressing   Problem: Elimination: Goal: Will not experience complications related to bowel motility Outcome: Progressing Goal: Will not experience complications related to urinary retention Outcome: Progressing   Problem: Pain Management: Goal: General experience of comfort will improve Outcome: Progressing   Problem: Safety: Goal: Ability to remain free from injury will improve Outcome: Progressing   Problem: Activity: Goal: Risk for activity intolerance will decrease Outcome: Not Progressing

## 2023-07-25 DIAGNOSIS — R042 Hemoptysis: Secondary | ICD-10-CM | POA: Diagnosis not present

## 2023-07-25 LAB — BASIC METABOLIC PANEL
Anion gap: 10 (ref 5–15)
BUN: 43 mg/dL — ABNORMAL HIGH (ref 8–23)
CO2: 30 mmol/L (ref 22–32)
Calcium: 8.9 mg/dL (ref 8.9–10.3)
Chloride: 97 mmol/L — ABNORMAL LOW (ref 98–111)
Creatinine, Ser: 1.12 mg/dL — ABNORMAL HIGH (ref 0.44–1.00)
GFR, Estimated: 49 mL/min — ABNORMAL LOW (ref >60)
Glucose, Bld: 116 mg/dL — ABNORMAL HIGH (ref 70–99)
Potassium: 5.3 mmol/L — ABNORMAL HIGH (ref 3.5–5.1)
Sodium: 137 mmol/L (ref 135–145)

## 2023-07-25 LAB — CBC
HCT: 36.2 % (ref 36.0–46.0)
Hemoglobin: 11.6 g/dL — ABNORMAL LOW (ref 12.0–15.0)
MCH: 28.6 pg (ref 26.0–34.0)
MCHC: 32 g/dL (ref 30.0–36.0)
MCV: 89.2 fL (ref 80.0–100.0)
Platelets: 407 10*3/uL — ABNORMAL HIGH (ref 150–400)
RBC: 4.06 MIL/uL (ref 3.87–5.11)
RDW: 13.8 % (ref 11.5–15.5)
WBC: 13.9 10*3/uL — ABNORMAL HIGH (ref 4.0–10.5)
nRBC: 0 % (ref 0.0–0.2)

## 2023-07-25 LAB — MAGNESIUM: Magnesium: 2.5 mg/dL — ABNORMAL HIGH (ref 1.7–2.4)

## 2023-07-25 MED ORDER — DEXAMETHASONE 6 MG PO TABS
6.0000 mg | ORAL_TABLET | Freq: Two times a day (BID) | ORAL | Status: DC
  Administered 2023-07-25 – 2023-07-27 (×4): 6 mg via ORAL
  Filled 2023-07-25 (×4): qty 1

## 2023-07-25 NOTE — Progress Notes (Signed)
PROGRESS NOTE    [name] Douglas  WUJ:[id number] DOB: 10-Jul-1941 DOA: 07/15/2023 PCP: Mick Sell, MD  [id number]  LOS: 9 days   Brief hospital course:   Assessment & Plan: 82 year old with history of stroke on Pradaxa, P A-fib/SVT, recently diagnosed RUL lung small cell lung cancer status post radiation in September, COPD, HTN, DM2, CHF with preserved EF admitted for shortness of breath. Initially found to be in SVT broke with adenosine. CT chest negative for PE but possible lymphadenopathy from mets. She also reported of hemoptysis. Currently being treated for bronchitis/pneumonia with COPD exacerbation. Hemoptysis appears to have subsided for now. CT scan showing possible progression of malignancy therefore notified her oncology team.    Acute on chronic hypoxic respiratory failure Hemoptysis Post-radiation pneumonitis Baseline 2L O2 I suspect combination of COPD exacerbation/pneumonitis and worsening malignancy.  Hemoptysis from persistent coughing and being on Pradaxa.  CT scan does show concerning for pneumonia but possibly worsening of malignancy therefore I have notified Dr. Carmina Miller from radiation oncology to possibly repeat scans in the near future, he is planning of it in January.  Family aware and will follow up with rad onc post dc --will need low-dose steroid for a few weeks, per pulm   Acute COPD exacerbation with concerns of bronchitis/pneumonia --persistent wheezing.  Received 8 days of solumedrol then transitioned to oral decadron. --pulm consulted Plan: --increase steroid back to to decadron 6 mg BID --cont azithromycin   Paroxysmal SVT/PAF -Triggered by hypoxia and COPD exacerbation.  Initially in SVT requiring adenosine.   --cont flecainide and pradaxa Outpatient follow-up with cardiology/EP   Elevated troponins --100's and down trended.  Likely demand ischemia.  EKG is nonischemic.     Bilateral pleural effusion -Patient appears to be  euvolemic, unclear nature of pleural effusion.  Slightly worse.  Possibly secondary to atelectasis versus congestive heart failure.   --s/p IV lasix 40 mg x2 doses   AKI Baseline creatinine 0.9, admission 1.44.     RUL non-small cell lung cancer -Status post radiation therapy   HTN --cont amlodipine   DVT prophylaxis: [id number]  Code Status: Full code  Family Communication:  Level of care: Telemetry Medical Dispo:   The patient is from: home Anticipated d/c is to: home Anticipated d/c date is: 2-3 days   Subjective and Interval History:  Pt reported feeling great, but admitted to DOE.  Pt couldn't tolerate Metaneb due to nausea.   Objective: Vitals:   07/25/23 0903 07/25/23 1410 07/25/23 1805 07/25/23 2009  BP: (!) 162/62   (!) 141/59  Pulse: 76 81  86  Resp: 18 (!) 22  18  Temp: 97.9 F (36.6 C)   98 F (36.7 C)  TempSrc:    Oral  SpO2: 95% 93% 95% 92%  Weight:      Height:        Intake/Output Summary (Last 24 hours) at 07/25/2023 2027 Last data filed at 07/25/2023 1700 Gross per 24 hour  Intake 240 ml  Output --  Net 240 ml   Filed Weights   07/15/23 0953  Weight: 90.8 kg    Examination:   Constitutional: NAD, AAOx3 HEENT: conjunctivae and lids normal, EOMI CV: No cyanosis.   RESP: normal respiratory effort, loud rhonchi and wheezes, on 2L Extremities: No effusions, edema in BLE SKIN: warm, dry Neuro: II - XII grossly intact.   Psych: Normal mood and affect.  Appropriate judgement and reason   Data Reviewed: I have personally reviewed labs and imaging  studies  Time spent: 35 minutes  Darlin Priestly, MD [facility] If 7PM-7AM, please contact night-coverage 07/25/2023, 8:27 PM

## 2023-07-25 NOTE — Progress Notes (Signed)
PULMONOLOGY         Date: 07/25/2023,   MRN# [id number] [name] Douglas [birth year]     AdmissionWeight: 90.8 kg                 CurrentWeight: 90.8 kg  Referring provider: Dr Georgeann Oppenheim   CHIEF COMPLAINT:   Acute on chronic hypoxemic respiratory failure   HISTORY OF PRESENT ILLNESS   This is a pleasant 82 year old female with a history of asthma and COPD overlap syndrome with chronic hypoxemia utilizing supplemental oxygen at 2 L/min nocturnally.  She also has cardiac dysfunction with atrial fibrillation, previous history of CVA, prediabetes hypothyroidism dementia, essential hypertension dyslipidemia GERD, diverticulosis major depressive disorder and anxiety disorder.  She does have right upper lobe non-small cell carcinoma and has received radiation therapy for this.  On this admission she reported coughing up phlegm with blood-streaked expectoration nonmassive hemoptysis.  She reports that this has been going on for approximately 1 week with associated progressive dyspnea and wheezing.  While in the emergency room she also had an arrhythmia and received adenosine injection which remedied this issue.  She was initially treated for acute exacerbation of COPD with DuoNeb nebulizer therapy as well as IV Solu-Medrol.  She also had CT chest 07/15/23 performed which was negative for PE however did find bilateral pleural effusions worse on the right with associated compressive atelectasis and interstitial edema.  There is also interval increase in right paratracheal and right hilar adenopathy and absence of previously noted right upper lobe nodule post radiation therapy.  Due to persistent respiratory symptoms with ongoing cough and wheezing PCCM consultation was placed for further evaluation management.   07/24/23- patient still wheezing with rhonchi bilaterally.  She reports improvement and asking for dc home.  She is on 2L/min baseline O2 req.  She diuresed well and bmp today consistent  with contraction. We dcd lasix and reduced steroids to decadron 2mg .  She continues to use metaneb. She will likely need low dose steroids for few weeks due to pneumonitis post radiation.  She is using IS, improved from tidal volume to in past 24h.   07/25/23- patient continues to have diffuse rhonchi and wheezing.  Its worse today after reducing steroids.  This seems to have started after November post radiation therapy and more indicative of radiation pneumonitis. She has not had diuresis over 24h but renal function is slightly worse, we can work on reducing any non essential nephrotoxins for now.  She drinks water and eats ice all day. Ive increase her steroids to dexadron 6mg  BID. She was unable to finish PT due to hypoxemia and respiratory distress.   PAST MEDICAL HISTORY   Past Medical History:  Diagnosis Date   Anxiety    Arthritis    "shoulders" (08/20/2014)   Asthma    Chronic lower back pain    Complication of anesthesia    "agitated & restless after knee replacement in 2008"   COPD (chronic obstructive pulmonary disease) (HCC)    DDD (degenerative disc disease), lumbar    Depression    Diabetes mellitus without complication (HCC)    Diverticulosis    Family history of adverse reaction to anesthesia    GERD (gastroesophageal reflux disease)    High cholesterol    Hypertension    Hypothyroidism    Memory loss    mild   On home oxygen therapy    "2L at night" (08/20/2014)   PAF (paroxysmal atrial fibrillation) (HCC)  Peripheral vascular disease (HCC)    Pre-diabetes    Shortness of breath dyspnea    with exertion   Stroke (HCC)    mild, no deficits     SURGICAL HISTORY   Past Surgical History:  Procedure Laterality Date   CARDIAC CATHETERIZATION  ~ 2007   EXCISIONAL HEMORRHOIDECTOMY  1960's   EYE SURGERY Bilateral    Cataract extraction with IOL   KNEE ARTHROSCOPY Left X 2 <2008   KNEE ARTHROSCOPY WITH SUBCHONDROPLASTY Right 04/19/2016   Procedure:  KNEE ARTHROSCOPY WITH SUBCHONDROPLASTY, PARTIAL MENISCECTOMY;  Surgeon: Kennedy Bucker, MD;  Location: [facility] ORS;  Service: Orthopedics;  Laterality: Right;   LOOP RECORDER INSERTION N/A 01/02/2018   Procedure: LOOP RECORDER INSERTION;  Surgeon: Duke Salvia, MD;  Location: [facility] INVASIVE CV LAB;  Service: Cardiovascular;  Laterality: N/A;   LUMBAR LAMINECTOMY/DECOMPRESSION MICRODISCECTOMY N/A 02/06/2021   Procedure: Lumbar Three-Four, Lumbar Four-Five Laminectomy and Foraminotomy;  Surgeon: Barnett Abu, MD;  Location: [facility] OR;  Service: Neurosurgery;  Laterality: N/A;   SHOULDER OPEN ROTATOR CUFF REPAIR Right 2001   TONSILLECTOMY  ~ 1950   TOTAL KNEE ARTHROPLASTY Left 2008   TOTAL KNEE ARTHROPLASTY Right 07/03/2016   Procedure: TOTAL KNEE ARTHROPLASTY;  Surgeon: Kennedy Bucker, MD;  Location: [facility] ORS;  Service: Orthopedics;  Laterality: Right;   TUBAL LIGATION  1973   VAGINAL HYSTERECTOMY  1974     FAMILY HISTORY   Family History  Problem Relation Age of Onset   Diabetes Mother    Hypertension Mother    Lung cancer Mother    Congestive Heart Failure Father    Congestive Heart Failure Sister      SOCIAL HISTORY   Social History   Tobacco Use   Smoking status: Every Day    Current packs/day: 0.25    Average packs/day: 0.3 packs/day for 53.0 years (13.3 ttl pk-yrs)    Types: Cigarettes   Smokeless tobacco: Never   Tobacco comments:    2 cigarettes per day  Vaping Use   Vaping status: Never Used  Substance Use Topics   Alcohol use: No   Drug use: No     MEDICATIONS    Home Medication:  Current Outpatient Rx   Order #: [id number] Class: Normal   Order #: [id number] Class: Normal   Order #: [id number] Class: Normal   Order #: [id number] Class: Normal   Order #: [id number] Class: Normal    Current Medication:  Current Facility-Administered Medications:    acetaminophen (TYLENOL) tablet 650 mg, 650 mg, Oral, Q6H PRN, Amin, Ankit C, MD, 650 mg at 07/24/23 0932   amLODipine  (NORVASC) tablet 5 mg, 5 mg, Oral, Daily, Chipper Herb, Ping T, MD, 5 mg at 07/24/23 9518   ascorbic acid (VITAMIN C) tablet 500 mg, 500 mg, Oral, Daily, Amin, Ankit C, MD, 500 mg at 07/24/23 0934   atorvastatin (LIPITOR) tablet 80 mg, 80 mg, Oral, Q24H, Mikey College T, MD, 80 mg at 07/24/23 1724   azithromycin (ZITHROMAX) tablet 250 mg, 250 mg, Oral, Daily, Darlin Priestly, MD, 250 mg at 07/24/23 1127   cyanocobalamin (VITAMIN B12) tablet 500 mcg, 500 mcg, Oral, Daily, Amin, Ankit C, MD, 500 mcg at 07/24/23 0932   dabigatran (PRADAXA) capsule 150 mg, 150 mg, Oral, Q12H, Mikey College T, MD, 150 mg at 07/24/23 2117   dexamethasone (DECADRON) tablet 2 mg, 2 mg, Oral, Daily, Calisa Luckenbaugh, MD   donepezil (ARICEPT) tablet 10 mg, 10 mg, Oral, QHS, Mikey College T, MD, 10 mg at 07/24/23 2117   flecainide (  TAMBOCOR) tablet 50 mg, 50 mg, Oral, BID, Mikey College T, MD, 50 mg at 07/24/23 2117   guaiFENesin (MUCINEX) 12 hr tablet 1,200 mg, 1,200 mg, Oral, BID, Mikey College T, MD, 1,200 mg at 07/24/23 2117   hydrALAZINE (APRESOLINE) injection 10 mg, 10 mg, Intravenous, Q4H PRN, Amin, Ankit C, MD   ipratropium-albuterol (DUONEB) 0.5-2.5 (3) MG/3ML nebulizer solution 3 mL, 3 mL, Nebulization, TID, Georgeann Oppenheim, Sudheer B, MD, 3 mL at 07/25/23 0723   levothyroxine (SYNTHROID) tablet 88 mcg, 88 mcg, Oral, Q0600, Emeline General, MD, 88 mcg at 07/24/23 0618   loratadine (CLARITIN) tablet 10 mg, 10 mg, Oral, Daily, Mikey College T, MD, 10 mg at 07/24/23 0934   melatonin tablet 5 mg, 5 mg, Oral, QHS, Sreenath, Sudheer B, MD, 5 mg at 07/23/23 2211   metoprolol tartrate (LOPRESSOR) injection 5 mg, 5 mg, Intravenous, Q4H PRN, Amin, Ankit C, MD   ondansetron (ZOFRAN) injection 4 mg, 4 mg, Intravenous, Q6H PRN, Amin, Ankit C, MD, 4 mg at 07/16/23 1055   ondansetron (ZOFRAN) tablet 4 mg, 4 mg, Oral, Q6H PRN **OR** [DISCONTINUED] ondansetron (ZOFRAN) injection 4 mg, 4 mg, Intravenous, Q6H PRN, Emeline General, MD   Oral care mouth rinse, 15 mL, Mouth  Rinse, PRN, Sreenath, Sudheer B, MD   phenol (CHLORASEPTIC) mouth spray 1 spray, 1 spray, Mouth/Throat, PRN, Amin, Ankit C, MD, 1 spray at 07/16/23 1830   senna-docusate (Senokot-S) tablet 1 tablet, 1 tablet, Oral, QHS PRN, Amin, Ankit C, MD   venlafaxine XR (EFFEXOR-XR) 24 hr capsule 150 mg, 150 mg, Oral, Q breakfast, Chipper Herb, Ping T, MD, 150 mg at 07/24/23 0932    ALLERGIES   Trazodone and nefazodone and Heparin     REVIEW OF SYSTEMS    Review of Systems:  Gen:  Denies  fever, sweats, chills weigh loss  HEENT: Denies blurred vision, double vision, ear pain, eye pain, hearing loss, nose bleeds, sore throat Cardiac:  No dizziness, chest pain or heaviness, chest tightness,edema Resp:   reports dyspnea chronically  Gi: Denies swallowing difficulty, stomach pain, nausea or vomiting, diarrhea, constipation, bowel incontinence Gu:  Denies bladder incontinence, burning urine Ext:   Denies Joint pain, stiffness or swelling Skin: Denies  skin rash, easy bruising or bleeding or hives Endoc:  Denies polyuria, polydipsia , polyphagia or weight change Psych:   Denies depression, insomnia or hallucinations   Other:  All other systems negative   VS: BP (!) 133/54 (BP Location: Right Arm)   Pulse 88   Temp 98.2 F (36.8 C) (Oral)   Resp 20   Ht 5\' 4"  (1.626 m)   Wt 90.8 kg   SpO2 93%   BMI 34.36 kg/m      PHYSICAL EXAM    GENERAL:NAD, no fevers, chills, no weakness no fatigue HEAD: Normocephalic, atraumatic.  EYES: Pupils equal, round, reactive to light. Extraocular muscles intact. No scleral icterus.  MOUTH: Moist mucosal membrane. Dentition intact. No abscess noted.  EAR, NOSE, THROAT: Clear without exudates. No external lesions.  NECK: Supple. No thyromegaly. No nodules. No JVD.  PULMONARY: decreased breath sounds with mild rhonchi worse at bases bilaterally.  CARDIOVASCULAR: S1 and S2. Regular rate and rhythm. No murmurs, rubs, or gallops. No edema. Pedal pulses 2+  bilaterally.  GASTROINTESTINAL: Soft, nontender, nondistended. No masses. Positive bowel sounds. No hepatosplenomegaly.  MUSCULOSKELETAL: No swelling, clubbing, or edema. Range of motion full in all extremities.  NEUROLOGIC: Cranial nerves II through XII are intact. No gross focal neurological deficits. Sensation  intact. Reflexes intact.  SKIN: No ulceration, lesions, rashes, or cyanosis. Skin warm and dry. Turgor intact.  PSYCHIATRIC: Mood, affect within normal limits. The patient is awake, alert and oriented x 3. Insight, judgment intact.       IMAGING     Narrative & Impression  CLINICAL DATA:  Shortness of breath with cough for 1 week. Pulmonary embolism suspected, high probability. Recent completion of radiation therapy for lung cancer.   EXAM: CT ANGIOGRAPHY CHEST WITH CONTRAST   TECHNIQUE: Multidetector CT imaging of the chest was performed using the standard protocol during bolus administration of intravenous contrast. Multiplanar CT image reconstructions and MIPs were obtained to evaluate the vascular anatomy.   RADIATION DOSE REDUCTION: This exam was performed according to the departmental dose-optimization program which includes automated exposure control, adjustment of the mA and/or kV according to patient size and/or use of iterative reconstruction technique.   CONTRAST:  50mL OMNIPAQUE IOHEXOL 350 MG/ML SOLN   COMPARISON:  Radiographs 07/15/2023. Chest CT 03/11/2023 and PET-CT 04/01/2023.   FINDINGS: Technical note: Despite efforts by the technologist and patient, mild motion artifact is present on today's exam and could not be eliminated. This reduces exam sensitivity and specificity.   Cardiovascular: The pulmonary arteries are well opacified with contrast to the level of the segmental branches. There is no evidence of acute pulmonary embolism. There is central enlargement of the pulmonary arteries. Atherosclerosis of the aorta, great vessels and  coronary arteries without acute systemic arterial abnormalities. Probable calcifications of the aortic valve and mitral annulus. The heart is mildly enlarged. No significant pericardial fluid.   Mediastinum/Nodes: Interval increase in right paratracheal and right hilar adenopathy. There is a right paratracheal node measuring 1.9 cm on image 44/4 and a right hilar node measuring 1.5 cm on image 58/4. No axillary or contralateral mediastinal adenopathy. The thyroid gland, trachea and esophagus demonstrate no significant findings.   Lungs/Pleura: New small right-greater-than-left pleural effusions with extension into the superior aspect of the right major fissure. Previously demonstrated right upper lobe pulmonary nodule which was hypermetabolic on PET-CT is not well seen, partly obscured by adjacent parenchymal opacity. No new or enlarging pulmonary nodules are identified. Underlying centrilobular and paraseptal emphysema with diffuse subpleural reticulation, as before.   Upper abdomen: The visualized upper abdomen appears stable, without acute findings.   Musculoskeletal/Chest wall: There is no chest wall mass or suspicious osseous finding. Unchanged mild chronic L1 compression deformity.   Unless specific follow-up recommendations are mentioned in the findings or impression sections, no imaging follow-up of any mentioned incidental findings is recommended.   Review of the MIP images confirms the above findings.   IMPRESSION: 1. No evidence of acute pulmonary embolism or other acute vascular findings. 2. New small right-greater-than-left pleural effusions with extension into the superior aspect of the right major fissure. 3. Interval increase in right paratracheal and right hilar adenopathy. Although potentially reactive, findings are suspicious for metastatic disease. Recommend attention on follow-up. 4. Previously demonstrated right upper lobe pulmonary nodule which was  hypermetabolic on PET-CT is not well seen, partly obscured by adjacent parenchymal opacity attributed to interval radiation therapy. No new or enlarging pulmonary nodules are identified. 5. Aortic Atherosclerosis (ICD10-I70.0) and Emphysema (ICD10-J43.9).     Electronically Signed   By: Carey Bullocks M.D.   ASSESSMENT/PLAN    Acute on Chronic hypoxemic respiratory failure -due to community acquired pneumonia with acute on chronic CHF and COPD exacerbation with partial component from radiation pneumonitis.  -she would benefit from  reduced steroid dosing, will increase to dexamethasone 6mg  bid due to worsening post taper.  - diuresed >1L now improved with mild AKI will dc lasix -continue zithromax 250 po daily for prophylaxis while treating COPD/Asthma -crp is flat    Bilateral pleural effusions   -mild to moderate on right and mild on left -S/P lasix 40 IV    Bibasilar atelectasis  - IS currently at 650cc/breath>>>102mL  - would like to add recruitment with metaneb and albuterol TID to treat atelectatic segments   - wean O2 as able with goal spO2 88%   Non -massive hemoptysis   - almost resolved now , continue zithromax and diuresis  -patient on pradaxa   Acute exacerbation of Asthma and COPD   Duoneb TID , no need for pulmicort or brovana , patient with AF -did not tolerate metaneb, does use IS, PT/OT       Thank you for allowing me to participate in the care of this patient.  Patient/Family are satisfied with care plan and all questions have been answered.    Provider disclosure: Patient with at least one acute or chronic illness or injury that poses a threat to life or bodily function and is being managed actively during this encounter.  All of the below services have been performed independently by signing provider:  review of prior documentation from internal and or external health records.  Review of previous and current lab results.  Interview and  comprehensive assessment during patient visit today. Review of current and previous chest radiographs/CT scans. Discussion of management and test interpretation with health care team and patient/family.   This document was prepared using Dragon voice recognition software and may include unintentional dictation errors.     Vida Rigger, M.D.  Division of Pulmonary & Critical Care Medicine

## 2023-07-25 NOTE — Plan of Care (Signed)

## 2023-07-26 DIAGNOSIS — R042 Hemoptysis: Secondary | ICD-10-CM | POA: Diagnosis not present

## 2023-07-26 LAB — BASIC METABOLIC PANEL
Anion gap: 10 (ref 5–15)
BUN: 42 mg/dL — ABNORMAL HIGH (ref 8–23)
CO2: 28 mmol/L (ref 22–32)
Calcium: 8.5 mg/dL — ABNORMAL LOW (ref 8.9–10.3)
Chloride: 99 mmol/L (ref 98–111)
Creatinine, Ser: 0.89 mg/dL (ref 0.44–1.00)
GFR, Estimated: >60 mL/min (ref >60)
Glucose, Bld: 177 mg/dL — ABNORMAL HIGH (ref 70–99)
Potassium: 5.1 mmol/L (ref 3.5–5.1)
Sodium: 137 mmol/L (ref 135–145)

## 2023-07-26 LAB — CBC
HCT: 33.5 % — ABNORMAL LOW (ref 36.0–46.0)
Hemoglobin: 11 g/dL — ABNORMAL LOW (ref 12.0–15.0)
MCH: 28.8 pg (ref 26.0–34.0)
MCHC: 32.8 g/dL (ref 30.0–36.0)
MCV: 87.7 fL (ref 80.0–100.0)
Platelets: 322 10*3/uL (ref 150–400)
RBC: 3.82 MIL/uL — ABNORMAL LOW (ref 3.87–5.11)
RDW: 14.1 % (ref 11.5–15.5)
WBC: 9.5 10*3/uL (ref 4.0–10.5)
nRBC: 0 % (ref 0.0–0.2)

## 2023-07-26 NOTE — Progress Notes (Signed)
Occupational Therapy Treatment Patient Details Name: [name] Douglas MRN: [id number] DOB: Apr 16, 1941 Today's Date: 07/26/2023   History of present illness 82 y.o. female presented with worsening of productive cough, wheezing, SOB and coughing up streaks of blood. CT chest findings: no PE, possible lymphadenopathy suspicious for metastatic disease.  Bilateral small pleural effusion. PMHx: stroke, PAF/paroxysmal SVT, recently diagnosed RUL non-small cell carcinoma s/p radiation therapy x 3 weeks in Sept, COPD, HTN, IIDM, chronic HFpEF.   OT comments  Pt is supine in bed on arrival. Pleasant and agreeable to OT session. She denies pain. Pt performed bed mobility MOD I, STS MOD I, and mobility in the room to the window and back x2 with seated rest break between. No AD used and distant SUP provided. Pt does drop to 87% on 2L, but increases with rest and PLB. Continued edu provided on PLB, pacing, ECS, etc. Pt verbalized understanding. She is performing all ADLs at baseline. Reports her daughter assists with bathing and anything else she needs. No further acute OT needs and will sign off in house with pt in agreement.  HHOT consult for home modification recommendations/safety with ADLs if desired.        If plan is discharge home, recommend the following:  Assist for transportation;Help with stairs or ramp for entrance;A little help with bathing/dressing/bathroom   Equipment Recommendations  None recommended by OT    Recommendations for Other Services      Precautions / Restrictions Restrictions Weight Bearing Restrictions: No       Mobility Bed Mobility Overal bed mobility: Modified Independent                  Transfers Overall transfer level: Modified independent Equipment used: None Transfers: Sit to/from Stand Sit to Stand: Modified independent (Device/Increase time)           General transfer comment: MOD I for bed mobility and STS from EOB; ambulated in room with no AD  and SUP with pt very cautious; reports use of rollator at home via rolling on it with limited mobility except to bathroom     Balance Overall balance assessment: Needs assistance Sitting-balance support: No upper extremity supported, Feet supported Sitting balance-Leahy Scale: Good     Standing balance support: No upper extremity supported, During functional activity Standing balance-Leahy Scale: Good Standing balance comment: no LOB, no AD use                           ADL either performed or assessed with clinical judgement   ADL Overall ADL's : Modified independent             Lower Body Bathing: Modified independent;Sitting/lateral leans Lower Body Bathing Details (indicate cue type and reason): donned socks seated EOB via figure four                            Extremity/Trunk Assessment Upper Extremity Assessment Upper Extremity Assessment: Overall WFL for tasks assessed   Lower Extremity Assessment Lower Extremity Assessment: Overall WFL for tasks assessed        Vision       Perception     Praxis      Cognition Arousal: Alert Behavior During Therapy: WFL for tasks assessed/performed Overall Cognitive Status: Within Functional Limits for tasks assessed  General Comments: AOx4. Pt pleasant and willing to participate in OT session.        Exercises Other Exercises Other Exercises: Re-edu on PLB, pacing, taking rest breaks and ECS to use during ADL performance and mobility to prevent overexertion/increased SOB.    Shoulder Instructions       General Comments      Pertinent Vitals/ Pain       Pain Assessment Pain Assessment: No/denies pain  Home Living                                          Prior Functioning/Environment              Frequency           Progress Toward Goals  OT Goals(current goals can now be found in the care plan section)   Progress towards OT goals: Goals met/education completed, patient discharged from OT  Acute Rehab OT Goals Patient Stated Goal: return home OT Goal Formulation: With patient Time For Goal Achievement: 07/30/23 Potential to Achieve Goals: Good  Plan      Co-evaluation                 AM-PAC OT "6 Clicks" Daily Activity     Outcome Measure   Help from another person eating meals?: None Help from another person taking care of personal grooming?: None Help from another person toileting, which includes using toliet, bedpan, or urinal?: None Help from another person bathing (including washing, rinsing, drying)?: A Little Help from another person to put on and taking off regular upper body clothing?: None Help from another person to put on and taking off regular lower body clothing?: None 6 Click Score: 23    End of Session Equipment Utilized During Treatment: Oxygen  OT Visit Diagnosis: Other abnormalities of gait and mobility (R26.89)   Activity Tolerance Patient tolerated treatment well   Patient Left with call bell/phone within reach;in bed   Nurse Communication          Time: 6045-4098 OT Time Calculation (min): 17 min  Charges: OT General Charges $OT Visit: 1 Visit OT Treatments $Therapeutic Activity: 8-22 mins  Cardell Rachel, OTR/L  07/26/23, 1:08 PM   Markeise Mathews E Tamikia Chowning 07/26/2023, 1:06 PM

## 2023-07-26 NOTE — Plan of Care (Signed)
  Problem: Health Behavior/Discharge Planning: Goal: Ability to manage health-related needs will improve Outcome: Progressing   Problem: Elimination: Goal: Will not experience complications related to bowel motility Outcome: Progressing Goal: Will not experience complications related to urinary retention Outcome: Progressing   Problem: Pain Management: Goal: General experience of comfort will improve Outcome: Progressing

## 2023-07-26 NOTE — Progress Notes (Signed)
PULMONOLOGY         Date: 07/26/2023,   MRN# [id number] [name] Douglas 1941/01/13     AdmissionWeight: 90.8 kg                 CurrentWeight: 90.8 kg  Referring provider: Dr Georgeann Oppenheim   CHIEF COMPLAINT:   Acute on chronic hypoxemic respiratory failure   HISTORY OF PRESENT ILLNESS   This is a pleasant 82 year old female with a history of asthma and COPD overlap syndrome with chronic hypoxemia utilizing supplemental oxygen at 2 L/min nocturnally.  She also has cardiac dysfunction with atrial fibrillation, previous history of CVA, prediabetes hypothyroidism dementia, essential hypertension dyslipidemia GERD, diverticulosis major depressive disorder and anxiety disorder.  She does have right upper lobe non-small cell carcinoma and has received radiation therapy for this.  On this admission she reported coughing up phlegm with blood-streaked expectoration nonmassive hemoptysis.  She reports that this has been going on for approximately 1 week with associated progressive dyspnea and wheezing.  While in the emergency room she also had an arrhythmia and received adenosine injection which remedied this issue.  She was initially treated for acute exacerbation of COPD with DuoNeb nebulizer therapy as well as IV Solu-Medrol.  She also had CT chest 07/15/23 performed which was negative for PE however did find bilateral pleural effusions worse on the right with associated compressive atelectasis and interstitial edema.  There is also interval increase in right paratracheal and right hilar adenopathy and absence of previously noted right upper lobe nodule post radiation therapy.  Due to persistent respiratory symptoms with ongoing cough and wheezing PCCM consultation was placed for further evaluation management.   07/24/23- patient still wheezing with rhonchi bilaterally.  She reports improvement and asking for dc home.  She is on 2L/min baseline O2 req.  She diuresed well and bmp today consistent  with contraction. We dcd lasix and reduced steroids to decadron 2mg .  She continues to use metaneb. She will likely need low dose steroids for few weeks due to pneumonitis post radiation.  She is using IS, improved from tidal volume to in past 24h.   07/25/23- patient continues to have diffuse rhonchi and wheezing.  Its worse today after reducing steroids.  This seems to have started after November post radiation therapy and more indicative of radiation pneumonitis. She has not had diuresis over 24h but renal function is slightly worse, we can work on reducing any non essential nephrotoxins for now.  She drinks water and eats ice all day. Ive increase her steroids to dexadron 6mg  BID. She was unable to finish PT due to hypoxemia and respiratory distress.   07/26/23- patient improved, less wheezing with increased steroids.  O2 stable at home setting of 2L/min.  She will need decadron therapy for at least 2 more weeks.   PAST MEDICAL HISTORY   Past Medical History:  Diagnosis Date   Anxiety    Arthritis    "shoulders" (08/20/2014)   Asthma    Chronic lower back pain    Complication of anesthesia    "agitated & restless after knee replacement in 2008"   COPD (chronic obstructive pulmonary disease) (HCC)    DDD (degenerative disc disease), lumbar    Depression    Diabetes mellitus without complication (HCC)    Diverticulosis    Family history of adverse reaction to anesthesia    GERD (gastroesophageal reflux disease)    High cholesterol    Hypertension  Hypothyroidism    Memory loss    mild   On home oxygen therapy    "2L at night" (08/20/2014)   PAF (paroxysmal atrial fibrillation) (HCC)    Peripheral vascular disease (HCC)    Pre-diabetes    Shortness of breath dyspnea    with exertion   Stroke (HCC)    mild, no deficits     SURGICAL HISTORY   Past Surgical History:  Procedure Laterality Date   CARDIAC CATHETERIZATION  ~ 2007   EXCISIONAL HEMORRHOIDECTOMY  1960's    EYE SURGERY Bilateral    Cataract extraction with IOL   KNEE ARTHROSCOPY Left X 2 <2008   KNEE ARTHROSCOPY WITH SUBCHONDROPLASTY Right 04/19/2016   Procedure: KNEE ARTHROSCOPY WITH SUBCHONDROPLASTY, PARTIAL MENISCECTOMY;  Surgeon: Kennedy Bucker, MD;  Location: [facility] ORS;  Service: Orthopedics;  Laterality: Right;   LOOP RECORDER INSERTION N/A 01/02/2018   Procedure: LOOP RECORDER INSERTION;  Surgeon: Duke Salvia, MD;  Location: [facility] INVASIVE CV LAB;  Service: Cardiovascular;  Laterality: N/A;   LUMBAR LAMINECTOMY/DECOMPRESSION MICRODISCECTOMY N/A 02/06/2021   Procedure: Lumbar Three-Four, Lumbar Four-Five Laminectomy and Foraminotomy;  Surgeon: Barnett Abu, MD;  Location: [facility] OR;  Service: Neurosurgery;  Laterality: N/A;   SHOULDER OPEN ROTATOR CUFF REPAIR Right 2001   TONSILLECTOMY  ~ 1950   TOTAL KNEE ARTHROPLASTY Left 2008   TOTAL KNEE ARTHROPLASTY Right 07/03/2016   Procedure: TOTAL KNEE ARTHROPLASTY;  Surgeon: Kennedy Bucker, MD;  Location: [facility] ORS;  Service: Orthopedics;  Laterality: Right;   TUBAL LIGATION  1973   VAGINAL HYSTERECTOMY  1974     FAMILY HISTORY   Family History  Problem Relation Age of Onset   Diabetes Mother    Hypertension Mother    Lung cancer Mother    Congestive Heart Failure Father    Congestive Heart Failure Sister      SOCIAL HISTORY   Social History   Tobacco Use   Smoking status: Every Day    Current packs/day: 0.25    Average packs/day: 0.3 packs/day for 53.0 years (13.3 ttl pk-yrs)    Types: Cigarettes   Smokeless tobacco: Never   Tobacco comments:    2 cigarettes per day  Vaping Use   Vaping status: Never Used  Substance Use Topics   Alcohol use: No   Drug use: No     MEDICATIONS    Home Medication:  Current Outpatient Rx   Order #: [id number] Class: Normal   Order #: [id number] Class: Normal   Order #: [id number] Class: Normal   Order #: [id number] Class: Normal   Order #: [id number] Class: Normal    Current  Medication:  Current Facility-Administered Medications:    acetaminophen (TYLENOL) tablet 650 mg, 650 mg, Oral, Q6H PRN, Amin, Ankit C, MD, 650 mg at 07/24/23 0932   amLODipine (NORVASC) tablet 5 mg, 5 mg, Oral, Daily, Chipper Herb, Ping T, MD, 5 mg at 07/25/23 6433   ascorbic acid (VITAMIN C) tablet 500 mg, 500 mg, Oral, Daily, Amin, Ankit C, MD, 500 mg at 07/25/23 0838   atorvastatin (LIPITOR) tablet 80 mg, 80 mg, Oral, Q24H, Mikey College T, MD, 80 mg at 07/25/23 1710   azithromycin (ZITHROMAX) tablet 250 mg, 250 mg, Oral, Daily, Darlin Priestly, MD, 250 mg at 07/25/23 2951   cyanocobalamin (VITAMIN B12) tablet 500 mcg, 500 mcg, Oral, Daily, Amin, Ankit C, MD, 500 mcg at 07/25/23 0838   dabigatran (PRADAXA) capsule 150 mg, 150 mg, Oral, Q12H, Mikey College T, MD, 150 mg at 07/25/23 2157   dexamethasone (DECADRON)  tablet 6 mg, 6 mg, Oral, Q12H, Vida Rigger, MD, 6 mg at 07/25/23 2156   donepezil (ARICEPT) tablet 10 mg, 10 mg, Oral, QHS, Mikey College T, MD, 10 mg at 07/25/23 2156   flecainide (TAMBOCOR) tablet 50 mg, 50 mg, Oral, BID, Mikey College T, MD, 50 mg at 07/25/23 2157   guaiFENesin (MUCINEX) 12 hr tablet 1,200 mg, 1,200 mg, Oral, BID, Mikey College T, MD, 1,200 mg at 07/25/23 2156   hydrALAZINE (APRESOLINE) injection 10 mg, 10 mg, Intravenous, Q4H PRN, Amin, Ankit C, MD   ipratropium-albuterol (DUONEB) 0.5-2.5 (3) MG/3ML nebulizer solution 3 mL, 3 mL, Nebulization, TID, Georgeann Oppenheim, Sudheer B, MD, 3 mL at 07/26/23 0718   levothyroxine (SYNTHROID) tablet 88 mcg, 88 mcg, Oral, Q0600, Emeline General, MD, 88 mcg at 07/26/23 0534   loratadine (CLARITIN) tablet 10 mg, 10 mg, Oral, Daily, Mikey College T, MD, 10 mg at 07/25/23 1610   melatonin tablet 5 mg, 5 mg, Oral, QHS, Sreenath, Sudheer B, MD, 5 mg at 07/25/23 2156   metoprolol tartrate (LOPRESSOR) injection 5 mg, 5 mg, Intravenous, Q4H PRN, Amin, Ankit C, MD   ondansetron (ZOFRAN) injection 4 mg, 4 mg, Intravenous, Q6H PRN, Amin, Ankit C, MD, 4 mg at 07/16/23  1055   ondansetron (ZOFRAN) tablet 4 mg, 4 mg, Oral, Q6H PRN **OR** [DISCONTINUED] ondansetron (ZOFRAN) injection 4 mg, 4 mg, Intravenous, Q6H PRN, Emeline General, MD   Oral care mouth rinse, 15 mL, Mouth Rinse, PRN, Sreenath, Sudheer B, MD   phenol (CHLORASEPTIC) mouth spray 1 spray, 1 spray, Mouth/Throat, PRN, Amin, Ankit C, MD, 1 spray at 07/16/23 1830   senna-docusate (Senokot-S) tablet 1 tablet, 1 tablet, Oral, QHS PRN, Amin, Ankit C, MD   venlafaxine XR (EFFEXOR-XR) 24 hr capsule 150 mg, 150 mg, Oral, Q breakfast, Chipper Herb, Ping T, MD, 150 mg at 07/25/23 9604    ALLERGIES   Trazodone and nefazodone and Heparin     REVIEW OF SYSTEMS    Review of Systems:  Gen:  Denies  fever, sweats, chills weigh loss  HEENT: Denies blurred vision, double vision, ear pain, eye pain, hearing loss, nose bleeds, sore throat Cardiac:  No dizziness, chest pain or heaviness, chest tightness,edema Resp:   reports dyspnea chronically  Gi: Denies swallowing difficulty, stomach pain, nausea or vomiting, diarrhea, constipation, bowel incontinence Gu:  Denies bladder incontinence, burning urine Ext:   Denies Joint pain, stiffness or swelling Skin: Denies  skin rash, easy bruising or bleeding or hives Endoc:  Denies polyuria, polydipsia , polyphagia or weight change Psych:   Denies depression, insomnia or hallucinations   Other:  All other systems negative   VS: BP (!) 126/48 (BP Location: Right Arm)   Pulse 67   Temp 97.9 F (36.6 C) (Oral)   Resp 18   Ht 5\' 4"  (1.626 m)   Wt 90.8 kg   SpO2 93%   BMI 34.36 kg/m      PHYSICAL EXAM    GENERAL:NAD, no fevers, chills, no weakness no fatigue HEAD: Normocephalic, atraumatic.  EYES: Pupils equal, round, reactive to light. Extraocular muscles intact. No scleral icterus.  MOUTH: Moist mucosal membrane. Dentition intact. No abscess noted.  EAR, NOSE, THROAT: Clear without exudates. No external lesions.  NECK: Supple. No thyromegaly. No nodules. No  JVD.  PULMONARY: decreased breath sounds with mild rhonchi worse at bases bilaterally.  CARDIOVASCULAR: S1 and S2. Regular rate and rhythm. No murmurs, rubs, or gallops. No edema. Pedal pulses 2+ bilaterally.  GASTROINTESTINAL: Soft,  nontender, nondistended. No masses. Positive bowel sounds. No hepatosplenomegaly.  MUSCULOSKELETAL: No swelling, clubbing, or edema. Range of motion full in all extremities.  NEUROLOGIC: Cranial nerves II through XII are intact. No gross focal neurological deficits. Sensation intact. Reflexes intact.  SKIN: No ulceration, lesions, rashes, or cyanosis. Skin warm and dry. Turgor intact.  PSYCHIATRIC: Mood, affect within normal limits. The patient is awake, alert and oriented x 3. Insight, judgment intact.       IMAGING     Narrative & Impression  CLINICAL DATA:  Shortness of breath with cough for 1 week. Pulmonary embolism suspected, high probability. Recent completion of radiation therapy for lung cancer.   EXAM: CT ANGIOGRAPHY CHEST WITH CONTRAST   TECHNIQUE: Multidetector CT imaging of the chest was performed using the standard protocol during bolus administration of intravenous contrast. Multiplanar CT image reconstructions and MIPs were obtained to evaluate the vascular anatomy.   RADIATION DOSE REDUCTION: This exam was performed according to the departmental dose-optimization program which includes automated exposure control, adjustment of the mA and/or kV according to patient size and/or use of iterative reconstruction technique.   CONTRAST:  50mL OMNIPAQUE IOHEXOL 350 MG/ML SOLN   COMPARISON:  Radiographs 07/15/2023. Chest CT 03/11/2023 and PET-CT 04/01/2023.   FINDINGS: Technical note: Despite efforts by the technologist and patient, mild motion artifact is present on today's exam and could not be eliminated. This reduces exam sensitivity and specificity.   Cardiovascular: The pulmonary arteries are well opacified with contrast to the  level of the segmental branches. There is no evidence of acute pulmonary embolism. There is central enlargement of the pulmonary arteries. Atherosclerosis of the aorta, great vessels and coronary arteries without acute systemic arterial abnormalities. Probable calcifications of the aortic valve and mitral annulus. The heart is mildly enlarged. No significant pericardial fluid.   Mediastinum/Nodes: Interval increase in right paratracheal and right hilar adenopathy. There is a right paratracheal node measuring 1.9 cm on image 44/4 and a right hilar node measuring 1.5 cm on image 58/4. No axillary or contralateral mediastinal adenopathy. The thyroid gland, trachea and esophagus demonstrate no significant findings.   Lungs/Pleura: New small right-greater-than-left pleural effusions with extension into the superior aspect of the right major fissure. Previously demonstrated right upper lobe pulmonary nodule which was hypermetabolic on PET-CT is not well seen, partly obscured by adjacent parenchymal opacity. No new or enlarging pulmonary nodules are identified. Underlying centrilobular and paraseptal emphysema with diffuse subpleural reticulation, as before.   Upper abdomen: The visualized upper abdomen appears stable, without acute findings.   Musculoskeletal/Chest wall: There is no chest wall mass or suspicious osseous finding. Unchanged mild chronic L1 compression deformity.   Unless specific follow-up recommendations are mentioned in the findings or impression sections, no imaging follow-up of any mentioned incidental findings is recommended.   Review of the MIP images confirms the above findings.   IMPRESSION: 1. No evidence of acute pulmonary embolism or other acute vascular findings. 2. New small right-greater-than-left pleural effusions with extension into the superior aspect of the right major fissure. 3. Interval increase in right paratracheal and right hilar adenopathy.  Although potentially reactive, findings are suspicious for metastatic disease. Recommend attention on follow-up. 4. Previously demonstrated right upper lobe pulmonary nodule which was hypermetabolic on PET-CT is not well seen, partly obscured by adjacent parenchymal opacity attributed to interval radiation therapy. No new or enlarging pulmonary nodules are identified. 5. Aortic Atherosclerosis (ICD10-I70.0) and Emphysema (ICD10-J43.9).     Electronically Signed   By: Chrissie Noa  Purcell Mouton M.D.   ASSESSMENT/PLAN    Acute on Chronic hypoxemic respiratory failure -due to community acquired pneumonia with acute on chronic CHF and COPD exacerbation with partial component from radiation pneumonitis.  -she would benefit from reduced steroid dosing, will increase to dexamethasone 6mg  bid due to worsening post taper.  - diuresed >1L now improved with mild AKI will dc lasix -continue zithromax 250 po daily for prophylaxis while treating COPD/Asthma -crp is flat    Bilateral pleural effusions   -mild to moderate on right and mild on left -S/P lasix 40 IV    Bibasilar atelectasis  - IS currently at 650cc/breath>>>1047mL  - would like to add recruitment with metaneb and albuterol TID to treat atelectatic segments   - wean O2 as able with goal spO2 88%   Non -massive hemoptysis   - almost resolved now , continue zithromax and diuresis  -patient on pradaxa   Acute exacerbation of Asthma and COPD   Duoneb TID , no need for pulmicort or brovana , patient with AF -did not tolerate metaneb, does use IS, PT/OT       Thank you for allowing me to participate in the care of this patient.  Patient/Family are satisfied with care plan and all questions have been answered.    Provider disclosure: Patient with at least one acute or chronic illness or injury that poses a threat to life or bodily function and is being managed actively during this encounter.  All of the below services have been  performed independently by signing provider:  review of prior documentation from internal and or external health records.  Review of previous and current lab results.  Interview and comprehensive assessment during patient visit today. Review of current and previous chest radiographs/CT scans. Discussion of management and test interpretation with health care team and patient/family.   This document was prepared using Dragon voice recognition software and may include unintentional dictation errors.     Vida Rigger, M.D.  Division of Pulmonary & Critical Care Medicine

## 2023-07-26 NOTE — Progress Notes (Signed)
PROGRESS NOTE    [name] Douglas  WUJ:[id number] DOB: 06/29/1941 DOA: 07/15/2023 PCP: Mick Sell, MD  [id number]  LOS: 10 days   Brief hospital course:   Assessment & Plan: 82 year old with history of stroke on Pradaxa, P A-fib/SVT, recently diagnosed RUL lung small cell lung cancer status post radiation in September, COPD, HTN, DM2, CHF with preserved EF admitted for shortness of breath. Initially found to be in SVT broke with adenosine. CT chest negative for PE but possible lymphadenopathy from mets. She also reported of hemoptysis. Currently being treated for bronchitis/pneumonia with COPD exacerbation. Hemoptysis appears to have subsided for now. CT scan showing possible progression of malignancy therefore notified her oncology team.    Acute on chronic hypoxic respiratory failure Hemoptysis Post-radiation pneumonitis Baseline 2L O2 I suspect combination of COPD exacerbation/pneumonitis and worsening malignancy.  Hemoptysis from persistent coughing and being on Pradaxa.  CT scan does show concerning for pneumonia but possibly worsening of malignancy therefore I have notified Dr. Carmina Miller from radiation oncology to possibly repeat scans in the near future, he is planning of it in January.  Family aware and will follow up with rad onc post dc --will need low-dose steroid for a few weeks, per pulm   Acute COPD exacerbation with concerns of bronchitis/pneumonia --persistent wheezing.  Received 8 days of solumedrol then transitioned to oral decadron. --pulm consulted Plan: --cont decadron 6 mg q12h --cont azithromycin --per pulm, at discharge, decadron 6mg  daily with zithromax 250po daily until follow up in 1-2 wk   Paroxysmal SVT/PAF -Triggered by hypoxia and COPD exacerbation.  Initially in SVT requiring adenosine.   --cont flecainide and pradaxa Outpatient follow-up with cardiology/EP   Elevated troponins --100's and down trended.  Likely demand ischemia.  EKG is  nonischemic.     Bilateral pleural effusion -Patient appears to be euvolemic, unclear nature of pleural effusion.  Slightly worse.  Possibly secondary to atelectasis versus congestive heart failure.   --s/p IV lasix 40 mg x2 doses   AKI Baseline creatinine 0.9, admission 1.44.     RUL non-small cell lung cancer -Status post radiation therapy   HTN --cont amlodipine  Hypothyroidism --cont Synthroid   DVT prophylaxis: [id number]  Code Status: Full code  Family Communication: daughter updated at bedside today Level of care: Telemetry Medical Dispo:   The patient is from: home Anticipated d/c is to: home Anticipated d/c date is: possible tomorrow   Subjective and Interval History:  Pt reported feeling well, continued to have DOE, but able to walk short distances to bathroom and window without help.   Objective: Vitals:   07/26/23 0410 07/26/23 0812 07/26/23 1431 07/26/23 1655  BP: (!) 126/48 (!) 165/57  (!) 153/46  Pulse: 67 80 90 80  Resp: 18 18 (!) 24 19  Temp: 97.9 F (36.6 C) 98 F (36.7 C)  98.8 F (37.1 C)  TempSrc: Oral     SpO2: 93% 97% 90% 94%  Weight:      Height:        Intake/Output Summary (Last 24 hours) at 07/26/2023 1853 Last data filed at 07/26/2023 1051 Gross per 24 hour  Intake 120 ml  Output --  Net 120 ml   Filed Weights   07/15/23 0953  Weight: 90.8 kg    Examination:   Constitutional: NAD, AAOx3 HEENT: conjunctivae and lids normal, EOMI CV: No cyanosis.   RESP: normal respiratory effort, loud rhonchi and wheezing, on 2L Extremities: No effusions, edema in BLE SKIN: warm, dry  Neuro: II - XII grossly intact.   Psych: Normal mood and affect.  Appropriate judgement and reason   Data Reviewed: I have personally reviewed labs and imaging studies  Time spent: 35 minutes  Darlin Priestly, MD [facility] If 7PM-7AM, please contact night-coverage 07/26/2023, 6:53 PM

## 2023-07-27 DIAGNOSIS — R042 Hemoptysis: Secondary | ICD-10-CM | POA: Diagnosis not present

## 2023-07-27 MED ORDER — GUAIFENESIN ER 600 MG PO TB12: 1200.0000 mg | ORAL_TABLET | Freq: Two times a day (BID) | ORAL | 0 refills | Status: AC

## 2023-07-27 MED ORDER — IPRATROPIUM-ALBUTEROL 0.5-2.5 (3) MG/3ML IN SOLN: 3.0000 mL | RESPIRATORY_TRACT | 2 refills | Status: DC | PRN

## 2023-07-27 MED ORDER — FUROSEMIDE 20 MG PO TABS: 20.0000 mg | ORAL_TABLET | ORAL | Status: DC

## 2023-07-27 MED ORDER — IPRATROPIUM-ALBUTEROL 0.5-2.5 (3) MG/3ML IN SOLN: 3.0000 mL | RESPIRATORY_TRACT | Status: DC | PRN

## 2023-07-27 MED ORDER — AMLODIPINE BESYLATE 10 MG PO TABS
10.0000 mg | ORAL_TABLET | Freq: Every day | ORAL | Status: DC
  Administered 2023-07-27: 10 mg via ORAL
  Filled 2023-07-27: qty 1

## 2023-07-27 MED ORDER — AMLODIPINE BESYLATE 10 MG PO TABS: 10.0000 mg | ORAL_TABLET | Freq: Every day | ORAL | 2 refills | Status: DC

## 2023-07-27 MED ORDER — AZITHROMYCIN 250 MG PO TABS: 250.0000 mg | ORAL_TABLET | Freq: Every day | ORAL | 0 refills | Status: AC

## 2023-07-27 MED ORDER — FUROSEMIDE 20 MG PO TABS
20.0000 mg | ORAL_TABLET | ORAL | Status: DC
  Administered 2023-07-27: 20 mg via ORAL
  Filled 2023-07-27: qty 1

## 2023-07-27 MED ORDER — DEXAMETHASONE 6 MG PO TABS: 6.0000 mg | ORAL_TABLET | Freq: Every day | ORAL | 0 refills | Status: AC

## 2023-07-27 MED ORDER — AMLODIPINE BESYLATE 10 MG PO TABS: 10.0000 mg | ORAL_TABLET | Freq: Every day | ORAL | Status: DC

## 2023-07-27 NOTE — Discharge Summary (Signed)
Physician Discharge Summary   [name] Douglas  female DOB: 04-08-1941  ZDG:[id number]  PCP: Mick Sell, MD  Admit date: 07/15/2023 Discharge date: 07/27/2023  Admitted From: home Disposition:  home Home Health: Yes CODE STATUS: Full code  Discharge Instructions     Discharge instructions   Complete by: As directed    Continue decadron 6mg  daily with azithromycin 250 mg daily until follow up with Dr. Karna Christmas in 1-2 weeks. [facility] Course:  For full details, please see H&P, progress notes, consult notes and ancillary notes.  Briefly,  [name] Douglas is a 82 year old with history of stroke on Pradaxa, P A-fib/SVT, recently diagnosed RUL lung small cell lung cancer status post radiation in September, COPD, HTN, DM2, CHF with preserved EF admitted for shortness of breath.   Initially found to be in SVT broke with adenosine. CT chest negative for PE but possible lymphadenopathy from mets. She also reported of hemoptysis.  CT scan showing possible progression of malignancy therefore notified her oncology team.    Acute on chronic hypoxic respiratory failure Hemoptysis Post-radiation pneumonitis Baseline 2L O2 --suspect combination of COPD exacerbation/pneumonitis and worsening malignancy.  Hemoptysis from persistent coughing and being on Pradaxa.  CT scan does show findings concerning for pneumonia but possibly worsening of malignancy therefore notified Dr. Carmina Miller from radiation oncology to possibly repeat scans in the near future, he is planning of it in January.  Family aware and will follow up with rad onc post dc   Acute COPD exacerbation with concerns of bronchitis/pneumonia --persistent wheezing and rhonchi.  Received 8 days of solumedrol then transitioned to oral decadron. --pulm consulted, expected wheezing to persist for a while due to radiation pneumonitis, and since pt appeared and felt well, pt was cleared for discharge by pulm.   --pt was  discharged on oral decadron 6 mg daily and azithromycin 250 mg daily until f/u with pulm Dr. Richardson Dopp 1-2 weeks after discharge.   Paroxysmal SVT/PAF -Triggered by hypoxia and COPD exacerbation.  Initially in SVT requiring adenosine.   --cont flecainide and pradaxa Outpatient follow-up with cardiology/EP   Elevated troponins --100's and down trended.  Likely demand ischemia.  EKG is nonischemic.     Bilateral pleural effusion -Patient appears to be euvolemic, unclear nature of pleural effusion.  Slightly worse.  Possibly secondary to atelectasis versus congestive heart failure.   --s/p IV lasix 40 mg x2 doses   AKI Baseline creatinine 0.9, admission 1.44.  Cr back to baseline 0.89 prior to discharge.   RUL non-small cell lung cancer -Status post radiation therapy   HTN --cont amlodipine, increased from 5 to 10 mg daily.   Hypothyroidism --cont Synthroid   Unless noted above, medications under "STOP" list are ones pt was not taking PTA.  Discharge Diagnoses:  Principal Problem:   Hemoptysis Active Problems:   COPD (chronic obstructive pulmonary disease) (HCC)   CAP (community acquired pneumonia)   30 Day Unplanned Readmission Risk Score    Flowsheet Row ED to Hosp-Admission (Current) from 07/15/2023 in [facility]  30 Day Unplanned Readmission Risk Score (%) 21.98 Filed at 07/27/2023 1200       This score is the patient's risk of an unplanned readmission within 30 days of being discharged (0 -100%). The score is based on dignosis, age, lab data, medications, orders, and past utilization.   Low:  0-14.9   Medium: 15-21.9   High: 22-29.9   Extreme:  30 and above         Discharge Instructions:  Allergies as of 07/27/2023       Reactions   Trazodone And Nefazodone Other (See Comments)   Agitation   Heparin Nausea And Vomiting        Medication List     STOP taking these medications    HYDROmorphone 2 MG  tablet Commonly known as: DILAUDID       TAKE these medications    albuterol 108 (90 Base) MCG/ACT inhaler Commonly known as: VENTOLIN HFA Inhale 2 puffs into the lungs every 4 (four) hours as needed.   Allergy Relief 10 MG tablet Generic drug: loratadine Take 10 mg by mouth daily.   amLODipine 10 MG tablet Commonly known as: NORVASC Take 1 tablet (10 mg total) by mouth daily. Increased from 5 mg. What changed:  medication strength how much to take additional instructions   Arnuity Ellipta 50 MCG/ACT Aepb Generic drug: Fluticasone Furoate Inhale 1 puff into the lungs 2 (two) times daily.   atorvastatin 80 MG tablet Commonly known as: LIPITOR TAKE 1 TABLET BY MOUTH DAILY AT 6PM   azithromycin 250 MG tablet Commonly known as: ZITHROMAX Take 1 tablet (250 mg total) by mouth daily for 14 days.   budesonide 0.25 MG/2ML nebulizer solution Commonly known as: PULMICORT Take 2 mLs (0.25 mg total) by nebulization daily.   dabigatran 150 MG Caps capsule Commonly known as: PRADAXA Take 1 capsule (150 mg total) by mouth every 12 (twelve) hours.   dexamethasone 6 MG tablet Commonly known as: DECADRON Take 1 tablet (6 mg total) by mouth daily for 14 days.   diphenhydramine-acetaminophen 25-500 MG Tabs tablet Commonly known as: TYLENOL PM Take 1 tablet by mouth at bedtime as needed.   donepezil 10 MG tablet Commonly known as: ARICEPT TAKE ONE TABLET BY MOUTH AT BEDTIME   flecainide 50 MG tablet Commonly known as: TAMBOCOR TAKE 1 TABLET BY MOUTH TWICE A DAY   furosemide 20 MG tablet Commonly known as: LASIX TAKE 1 TABLET BY MOUTH EVERY OTHER DAY   guaiFENesin 600 MG 12 hr tablet Commonly known as: MUCINEX Take 2 tablets (1,200 mg total) by mouth 2 (two) times daily for 10 days.   ipratropium-albuterol 0.5-2.5 (3) MG/3ML Soln Commonly known as: DUONEB Take 3 mLs by nebulization every 4 (four) hours as needed.   iron polysaccharides 150 MG capsule Commonly known  as: NIFEREX Take 150 mg by mouth daily.   levothyroxine 88 MCG tablet Commonly known as: SYNTHROID TAKE 1 TABLET BY MOUTH DAILY   mometasone-formoterol 100-5 MCG/ACT Aero Commonly known as: DULERA Inhale 2 puffs into the lungs 2 (two) times daily.   mometasone-formoterol 100-5 MCG/ACT Aero Commonly known as: DULERA Inhale 2 puffs into the lungs daily.   triamcinolone ointment 0.1 % Commonly known as: KENALOG Apply 1 Application topically 2 (two) times daily.   venlafaxine XR 75 MG 24 hr capsule Commonly known as: EFFEXOR-XR TAKE 2 CAPSULES BY MOUTH DAILY What changed:  how to take this when to take this additional instructions   vitamin B-12 500 MCG tablet Commonly known as: CYANOCOBALAMIN Take 500 mcg by mouth daily.   vitamin C 250 MG tablet Commonly known as: ASCORBIC ACID Take 500 mg by mouth daily.   Vitamin D (Ergocalciferol) 1.25 MG (50000 UNIT) Caps capsule Commonly known as: DRISDOL Take 1 capsule by mouth weekly               Durable Medical Equipment  (  From admission, onward)           Start     Ordered   07/17/23 1157  For home use only DME Nebulizer machine  Once       Question Answer Comment  Patient needs a nebulizer to treat with the following condition COPD exacerbation (HCC)   Length of Need Lifetime      07/17/23 1156   07/17/23 1157  For home use only DME 3 n 1  Once        07/17/23 1156             Follow-up Information     Vida Rigger, MD Follow up in 10 day(s).   Specialty: Pulmonary Disease Contact information: [address] [place] [address] [phone]                 Allergies  Allergen Reactions   Trazodone And Nefazodone Other (See Comments)    Agitation   Heparin Nausea And Vomiting     The results of significant diagnostics from this hospitalization (including imaging, microbiology, ancillary and laboratory) are listed below for reference.    Consultations:   Procedures/Studies: DG Chest Port 1 View  Result Date: 07/24/2023 CLINICAL DATA:  Pulmonary edema. EXAM: PORTABLE CHEST 1 VIEW COMPARISON:  Chest radiograph dated 07/23/2023. FINDINGS: There is diffuse chronic interstitial coarsening. There are bibasilar atelectasis. Similar appearance of hazy density in the right upper lobe as seen on the prior radiograph and CT. No new consolidation. No pneumothorax. Stable cardiac silhouette no acute osseous pathology. IMPRESSION: No interval change.  No consolidation. Electronically Signed   By: Elgie Collard M.D.   On: 07/24/2023 19:10   DG Chest Port 1 View  Result Date: 07/23/2023 CLINICAL DATA:  82 year old female with shortness of breath and hemoptysis. Right upper lobe presume stage I lung cancer status post radiation treatment at the end of September. EXAM: PORTABLE CHEST 1 VIEW COMPARISON:  CTA chest 07/15/2023 and earlier. FINDINGS: Portable AP upright view at 0820 hours. Stable cardiomegaly and mediastinal contours. Visualized tracheal air column is within normal limits. Stable lung volumes. Improved right lung base ventilation since last month but new or increased reticulonodular opacity in the right upper lobe now. Underlying chronic pulmonary interstitial changes. No pneumothorax or pleural effusion. No acute osseous abnormality identified. Paucity of bowel gas. IMPRESSION: Cardiomegaly and chronic lung disease with improved right lung base ventilation. Increased reticular opacity in the right upper lung might be radiation pneumonitis in this setting rather than acute infection or inflammation. Electronically Signed   By: Odessa Fleming M.D.   On: 07/23/2023 11:29   CT Angio Chest PE W/Cm &/Or Wo Cm  Result Date: 07/15/2023 CLINICAL DATA:  Shortness of breath with cough for 1 week. Pulmonary embolism suspected, high probability. Recent completion of radiation therapy for lung cancer. EXAM: CT ANGIOGRAPHY CHEST WITH CONTRAST TECHNIQUE:  Multidetector CT imaging of the chest was performed using the standard protocol during bolus administration of intravenous contrast. Multiplanar CT image reconstructions and MIPs were obtained to evaluate the vascular anatomy. RADIATION DOSE REDUCTION: This exam was performed according to the departmental dose-optimization program which includes automated exposure control, adjustment of the mA and/or kV according to patient size and/or use of iterative reconstruction technique. CONTRAST:  50mL OMNIPAQUE IOHEXOL 350 MG/ML SOLN COMPARISON:  Radiographs 07/15/2023. Chest CT 03/11/2023 and PET-CT 04/01/2023. FINDINGS: Technical note: Despite efforts by the technologist and patient, mild motion artifact is present on today's exam and could not  be eliminated. This reduces exam sensitivity and specificity. Cardiovascular: The pulmonary arteries are well opacified with contrast to the level of the segmental branches. There is no evidence of acute pulmonary embolism. There is central enlargement of the pulmonary arteries. Atherosclerosis of the aorta, great vessels and coronary arteries without acute systemic arterial abnormalities. Probable calcifications of the aortic valve and mitral annulus. The heart is mildly enlarged. No significant pericardial fluid. Mediastinum/Nodes: Interval increase in right paratracheal and right hilar adenopathy. There is a right paratracheal node measuring 1.9 cm on image 44/4 and a right hilar node measuring 1.5 cm on image 58/4. No axillary or contralateral mediastinal adenopathy. The thyroid gland, trachea and esophagus demonstrate no significant findings. Lungs/Pleura: New small right-greater-than-left pleural effusions with extension into the superior aspect of the right major fissure. Previously demonstrated right upper lobe pulmonary nodule which was hypermetabolic on PET-CT is not well seen, partly obscured by adjacent parenchymal opacity. No new or enlarging pulmonary nodules are  identified. Underlying centrilobular and paraseptal emphysema with diffuse subpleural reticulation, as before. Upper abdomen: The visualized upper abdomen appears stable, without acute findings. Musculoskeletal/Chest wall: There is no chest wall mass or suspicious osseous finding. Unchanged mild chronic L1 compression deformity. Unless specific follow-up recommendations are mentioned in the findings or impression sections, no imaging follow-up of any mentioned incidental findings is recommended. Review of the MIP images confirms the above findings. IMPRESSION: 1. No evidence of acute pulmonary embolism or other acute vascular findings. 2. New small right-greater-than-left pleural effusions with extension into the superior aspect of the right major fissure. 3. Interval increase in right paratracheal and right hilar adenopathy. Although potentially reactive, findings are suspicious for metastatic disease. Recommend attention on follow-up. 4. Previously demonstrated right upper lobe pulmonary nodule which was hypermetabolic on PET-CT is not well seen, partly obscured by adjacent parenchymal opacity attributed to interval radiation therapy. No new or enlarging pulmonary nodules are identified. 5. Aortic Atherosclerosis (ICD10-I70.0) and Emphysema (ICD10-J43.9). Electronically Signed   By: Carey Bullocks M.D.   On: 07/15/2023 10:51   DG Chest Portable 1 View  Result Date: 07/15/2023 CLINICAL DATA:  Shortness of breath EXAM: PORTABLE CHEST 1 VIEW COMPARISON:  01/18/2023, 03/11/2023 FINDINGS: Cardiomegaly. Right worse than left bibasilar airspace consolidation. Known right upper lobe pulmonary nodule, not well seen radiographically. Probable small bilateral pleural effusions. No pneumothorax. IMPRESSION: 1. Right worse than left bibasilar airspace consolidation, suspicious for pneumonia. 2. Probable small bilateral pleural effusions. Electronically Signed   By: Duanne Guess D.O.   On: 07/15/2023 09:11       Labs: BNP (last 3 results) Recent Labs    12/24/22 1558 07/15/23 0852  BNP 125.1* 846.1*   Basic Metabolic Panel: Recent Labs  Lab 07/21/23 0540 07/22/23 0213 07/24/23 1031 07/25/23 0554 07/26/23 0606  NA 138 140 137 137 137  K 4.6 4.0 4.5 5.3* 5.1  CL 102 104 99 97* 99  CO2 25 26 25 30 28   GLUCOSE 157* 131* 180* 116* 177*  BUN 40* 38* 35* 43* 42*  CREATININE 0.79 0.85 1.06* 1.12* 0.89  CALCIUM 9.1 8.8* 8.8* 8.9 8.5*  MG 2.5* 2.4 2.3 2.5*  --    Liver Function Tests: No results for input(s): "AST", "ALT", "ALKPHOS", "BILITOT", "PROT", "ALBUMIN" in the last 168 hours. No results for input(s): "LIPASE", "AMYLASE" in the last 168 hours. No results for input(s): "AMMONIA" in the last 168 hours. CBC: Recent Labs  Lab 07/21/23 0540 07/22/23 0213 07/25/23 0554 07/26/23 0606  WBC 14.1* 15.6*  13.9* 9.5  HGB 10.8* 10.1* 11.6* 11.0*  HCT 33.5* 30.7* 36.2 33.5*  MCV 89.1 88.5 89.2 87.7  PLT 397 331 407* 322   Cardiac Enzymes: No results for input(s): "CKTOTAL", "CKMB", "CKMBINDEX", "TROPONINI" in the last 168 hours. BNP: Invalid input(s): "POCBNP" CBG: No results for input(s): "GLUCAP" in the last 168 hours. D-Dimer No results for input(s): "DDIMER" in the last 72 hours. Hgb A1c No results for input(s): "HGBA1C" in the last 72 hours. Lipid Profile No results for input(s): "CHOL", "HDL", "LDLCALC", "TRIG", "CHOLHDL", "LDLDIRECT" in the last 72 hours. Thyroid function studies No results for input(s): "TSH", "T4TOTAL", "T3FREE", "THYROIDAB" in the last 72 hours.  Invalid input(s): "FREET3" Anemia work up No results for input(s): "VITAMINB12", "FOLATE", "FERRITIN", "TIBC", "IRON", "RETICCTPCT" in the last 72 hours. Urinalysis    Component Value Date/Time   COLORURINE STRAW (A) 01/01/2023 1637   APPEARANCEUR CLEAR (A) 01/01/2023 1637   APPEARANCEUR CANCELED 05/28/2022 1546   LABSPEC 1.007 01/01/2023 1637   LABSPEC 1.035 08/02/2014 2331   PHURINE 5.0  01/01/2023 1637   GLUCOSEU >=500 (A) 01/01/2023 1637   GLUCOSEU Negative 08/02/2014 2331   HGBUR NEGATIVE 01/01/2023 1637   BILIRUBINUR NEGATIVE 01/01/2023 1637   BILIRUBINUR CANCELED 05/28/2022 1546   BILIRUBINUR Negative 08/02/2014 2331   KETONESUR NEGATIVE 01/01/2023 1637   PROTEINUR NEGATIVE 01/01/2023 1637   UROBILINOGEN 0.2 03/15/2020 1416   UROBILINOGEN 1.0 08/20/2014 2125   NITRITE NEGATIVE 01/01/2023 1637   LEUKOCYTESUR NEGATIVE 01/01/2023 1637   LEUKOCYTESUR Negative 08/02/2014 2331   Sepsis Labs Recent Labs  Lab 07/21/23 0540 07/22/23 0213 07/25/23 0554 07/26/23 0606  WBC 14.1* 15.6* 13.9* 9.5   Microbiology No results found for this or any previous visit (from the past 240 hour(s)).   Total time spend on discharging this patient, including the last patient exam, discussing the hospital stay, instructions for ongoing care as it relates to all pertinent caregivers, as well as preparing the medical discharge records, prescriptions, and/or referrals as applicable, is 35 minutes.    Darlin Priestly, MD  [facility] 07/27/2023, 2:42 PM

## 2023-07-27 NOTE — TOC Transition Note (Signed)
Transition of Care [facility]) - CM/SW Discharge Note   Patient Details  Name: [name] Douglas MRN: [id number] Date of Birth: October 23, 1940  [facility]) CM/SW Contact:  Liliana Cline, LCSW Phone Number: 07/27/2023, 2:47 PM   Clinical Narrative:    Patient is discharging home today. CSW called and notified Heather with [facility]. Asked RN to send home with face mask for nebulizer as per [facility] handoff.   Final next level of care: Home w Home Health Services Barriers to Discharge: Barriers Resolved   Patient Goals and CMS Choice      Discharge Placement                         Discharge Plan and Services Additional resources added to the After Visit Summary for                  DME Arranged: 3-N-1, Nebulizer machine DME Agency: [facility], Other - Comment ([facility]) Date DME Agency Contacted: 07/17/23   Representative spoke with at DME Agency: [employer]: Yvone Neu, [facility]: Rocky Morel [facility] Arranged: PT, OT [facility] Agency: [facility] ([facility]) Date [facility] Agency Contacted: 07/27/23   Representative spoke with at [facility] Agency: Herbert Seta  Social Determinants of Health (SDOH) Interventions SDOH Screenings   Food Insecurity: No Food Insecurity (07/15/2023)  Housing: Low Risk  (07/15/2023)  Transportation Needs: No Transportation Needs (07/15/2023)  Utilities: Not At Risk (07/15/2023)  Alcohol Screen: Low Risk  (08/08/2020)  Depression (PHQ2-9): Low Risk  (04/10/2021)  Financial Resource Strain: Low Risk  (06/03/2023)   Received from [facility]  Tobacco Use: High Risk (07/18/2023)     Readmission Risk Interventions    07/17/2023   11:52 AM  Readmission Risk Prevention Plan  Transportation Screening Complete  PCP or Specialist Appt within 3-5 Days Complete  HRI or Home Care Consult Complete  Social Work Consult for Recovery Care Planning/Counseling Complete  Palliative Care Screening Not Applicable   Medication Review Ocean[employer]) Complete

## 2023-07-27 NOTE — Plan of Care (Signed)

## 2023-07-27 NOTE — Progress Notes (Signed)
Patient is being discharged home to daughters care. AVS has been given to patient and daughter. Medications have been verified by [facility]. Patients daughter will be her transportation home. Daughter brought home O2 tank to leave the facility.

## 2023-07-28 DIAGNOSIS — J441 Chronic obstructive pulmonary disease with (acute) exacerbation: Secondary | ICD-10-CM | POA: Diagnosis not present

## 2023-07-28 DIAGNOSIS — E785 Hyperlipidemia, unspecified: Secondary | ICD-10-CM | POA: Diagnosis not present

## 2023-07-28 DIAGNOSIS — Z87891 Personal history of nicotine dependence: Secondary | ICD-10-CM | POA: Diagnosis not present

## 2023-07-28 DIAGNOSIS — F32A Depression, unspecified: Secondary | ICD-10-CM | POA: Diagnosis not present

## 2023-07-28 DIAGNOSIS — F419 Anxiety disorder, unspecified: Secondary | ICD-10-CM | POA: Diagnosis not present

## 2023-07-28 DIAGNOSIS — I5032 Chronic diastolic (congestive) heart failure: Secondary | ICD-10-CM | POA: Diagnosis not present

## 2023-07-28 DIAGNOSIS — I110 Hypertensive heart disease with heart failure: Secondary | ICD-10-CM | POA: Diagnosis not present

## 2023-07-28 DIAGNOSIS — C3411 Malignant neoplasm of upper lobe, right bronchus or lung: Secondary | ICD-10-CM | POA: Diagnosis not present

## 2023-07-28 DIAGNOSIS — J90 Pleural effusion, not elsewhere classified: Secondary | ICD-10-CM | POA: Diagnosis not present

## 2023-07-28 DIAGNOSIS — J440 Chronic obstructive pulmonary disease with acute lower respiratory infection: Secondary | ICD-10-CM | POA: Diagnosis not present

## 2023-07-28 DIAGNOSIS — E039 Hypothyroidism, unspecified: Secondary | ICD-10-CM | POA: Diagnosis not present

## 2023-07-28 DIAGNOSIS — E1151 Type 2 diabetes mellitus with diabetic peripheral angiopathy without gangrene: Secondary | ICD-10-CM | POA: Diagnosis not present

## 2023-07-28 DIAGNOSIS — Z7901 Long term (current) use of anticoagulants: Secondary | ICD-10-CM | POA: Diagnosis not present

## 2023-07-28 DIAGNOSIS — Z7951 Long term (current) use of inhaled steroids: Secondary | ICD-10-CM | POA: Diagnosis not present

## 2023-07-28 DIAGNOSIS — J9601 Acute respiratory failure with hypoxia: Secondary | ICD-10-CM | POA: Diagnosis not present

## 2023-07-28 DIAGNOSIS — K219 Gastro-esophageal reflux disease without esophagitis: Secondary | ICD-10-CM | POA: Diagnosis not present

## 2023-07-28 DIAGNOSIS — J449 Chronic obstructive pulmonary disease, unspecified: Secondary | ICD-10-CM | POA: Diagnosis not present

## 2023-07-28 DIAGNOSIS — R7989 Other specified abnormal findings of blood chemistry: Secondary | ICD-10-CM | POA: Diagnosis not present

## 2023-07-28 DIAGNOSIS — M51369 Other intervertebral disc degeneration, lumbar region without mention of lumbar back pain or lower extremity pain: Secondary | ICD-10-CM | POA: Diagnosis not present

## 2023-07-28 DIAGNOSIS — N179 Acute kidney failure, unspecified: Secondary | ICD-10-CM | POA: Diagnosis not present

## 2023-07-28 DIAGNOSIS — I480 Paroxysmal atrial fibrillation: Secondary | ICD-10-CM | POA: Diagnosis not present

## 2023-07-28 DIAGNOSIS — Z8673 Personal history of transient ischemic attack (TIA), and cerebral infarction without residual deficits: Secondary | ICD-10-CM | POA: Diagnosis not present

## 2023-07-28 DIAGNOSIS — J189 Pneumonia, unspecified organism: Secondary | ICD-10-CM | POA: Diagnosis not present

## 2023-07-29 NOTE — Consult Note (Signed)
Community Hospital Liaison Note  07/29/2023  [name] 1941/06/24 [id number]  Location: RN Hospital Liaison screened the patient remotely at [facility].  Insurance: Medicare   [name] Douglas is a 82 y.o. female who is a Primary Care Patient of Mick Sell, MD-[facility]. The patient was screened for  readmission hospitalization with noted medium risk score for unplanned readmission risk with 1 IP in 6 months.  The patient was assessed for potential Care Management service needs for post hospital transition for care coordination. Review of patient's electronic medical record reveals patient was admitted for Hemoptysis. Provider's office will complete the transition of care follow up. Pt d/c with [facility] for [facility] services. No VBCI services needed at this time.   VBCI Care Management/Population Health does not replace or interfere with any arrangements made by the Inpatient Transition of Care team.   For questions contact:   Elliot Cousin, RN, [facility] Liaison [place]   [facility], Population Health Office Hours MTWF  8:00 am-6:00 pm Direct Dial: [phone] mobile [phone] [Office toll free line] Office Hours are M-F 8:30 - 5 pm [email or web address]

## 2023-07-30 ENCOUNTER — Other Ambulatory Visit (HOSPITAL_COMMUNITY): Payer: Self-pay

## 2023-07-30 ENCOUNTER — Telehealth (HOSPITAL_COMMUNITY): Payer: Self-pay | Admitting: Pharmacy Technician

## 2023-07-30 NOTE — Telephone Encounter (Signed)
Pharmacy Patient Advocate Encounter  Received notification from [employer] [facility] [employer] Medicaid that Prior Authorization for Ipratropium-Albuterol 0.5-2.5 (3)MG/3ML solution  has been DENIED.  Full denial letter will be uploaded to the media tab. See denial reason below. Internal Notes: 2024 Request IPRATROPIUM-ALBUTEROL Solution is used in a nebulizer. A nebulizer is a piece of durable medical equipment (DME). Drugs used with DME in the home are covered under Medicare Part B. Our records show that you do not live in a long term care (LTC) facility. We cannot pay for drugs under Medicare Part D if they are covered under Medicare Part A or B. We did not decide whether IPRATROPIUM-ALBUTEROL Solution is medically necessary. We made our decision only on the fact that we cannot pay for the drug under Medicare Part D  PA #/Case ID/Reference #: [id number]

## 2023-07-30 NOTE — Telephone Encounter (Signed)
Pharmacy Patient Advocate Encounter   Received notification from Fax that prior authorization for Ipratropium-Albuterol 0.5-2.5 (3)MG/3ML solution is required/requested.   Insurance verification completed.   The patient is insured through [facility] [place] .   Per test claim: PA required; PA submitted to above mentioned insurance via CoverMyMeds Key/confirmation #/EOC [id number] Status is pending

## 2023-07-31 DIAGNOSIS — J189 Pneumonia, unspecified organism: Secondary | ICD-10-CM | POA: Diagnosis not present

## 2023-07-31 DIAGNOSIS — J441 Chronic obstructive pulmonary disease with (acute) exacerbation: Secondary | ICD-10-CM | POA: Diagnosis not present

## 2023-07-31 DIAGNOSIS — I480 Paroxysmal atrial fibrillation: Secondary | ICD-10-CM | POA: Diagnosis not present

## 2023-07-31 DIAGNOSIS — Z8673 Personal history of transient ischemic attack (TIA), and cerebral infarction without residual deficits: Secondary | ICD-10-CM | POA: Diagnosis not present

## 2023-07-31 DIAGNOSIS — C3411 Malignant neoplasm of upper lobe, right bronchus or lung: Secondary | ICD-10-CM | POA: Diagnosis not present

## 2023-07-31 DIAGNOSIS — J440 Chronic obstructive pulmonary disease with acute lower respiratory infection: Secondary | ICD-10-CM | POA: Diagnosis not present

## 2023-08-01 DIAGNOSIS — C3411 Malignant neoplasm of upper lobe, right bronchus or lung: Secondary | ICD-10-CM | POA: Diagnosis not present

## 2023-08-01 DIAGNOSIS — Z8673 Personal history of transient ischemic attack (TIA), and cerebral infarction without residual deficits: Secondary | ICD-10-CM | POA: Diagnosis not present

## 2023-08-01 DIAGNOSIS — I480 Paroxysmal atrial fibrillation: Secondary | ICD-10-CM | POA: Diagnosis not present

## 2023-08-01 DIAGNOSIS — J440 Chronic obstructive pulmonary disease with acute lower respiratory infection: Secondary | ICD-10-CM | POA: Diagnosis not present

## 2023-08-01 DIAGNOSIS — J441 Chronic obstructive pulmonary disease with (acute) exacerbation: Secondary | ICD-10-CM | POA: Diagnosis not present

## 2023-08-01 DIAGNOSIS — J189 Pneumonia, unspecified organism: Secondary | ICD-10-CM | POA: Diagnosis not present

## 2023-08-06 DIAGNOSIS — J700 Acute pulmonary manifestations due to radiation: Secondary | ICD-10-CM | POA: Diagnosis not present

## 2023-08-07 DIAGNOSIS — J440 Chronic obstructive pulmonary disease with acute lower respiratory infection: Secondary | ICD-10-CM | POA: Diagnosis not present

## 2023-08-07 DIAGNOSIS — Z8673 Personal history of transient ischemic attack (TIA), and cerebral infarction without residual deficits: Secondary | ICD-10-CM | POA: Diagnosis not present

## 2023-08-07 DIAGNOSIS — C3411 Malignant neoplasm of upper lobe, right bronchus or lung: Secondary | ICD-10-CM | POA: Diagnosis not present

## 2023-08-07 DIAGNOSIS — J189 Pneumonia, unspecified organism: Secondary | ICD-10-CM | POA: Diagnosis not present

## 2023-08-07 DIAGNOSIS — I480 Paroxysmal atrial fibrillation: Secondary | ICD-10-CM | POA: Diagnosis not present

## 2023-08-07 DIAGNOSIS — J441 Chronic obstructive pulmonary disease with (acute) exacerbation: Secondary | ICD-10-CM | POA: Diagnosis not present

## 2023-08-09 ENCOUNTER — Other Ambulatory Visit: Payer: Self-pay

## 2023-08-09 ENCOUNTER — Encounter: Payer: Self-pay | Admitting: Emergency Medicine

## 2023-08-09 DIAGNOSIS — J441 Chronic obstructive pulmonary disease with (acute) exacerbation: Secondary | ICD-10-CM | POA: Diagnosis not present

## 2023-08-09 DIAGNOSIS — D72829 Elevated white blood cell count, unspecified: Secondary | ICD-10-CM | POA: Diagnosis not present

## 2023-08-09 DIAGNOSIS — R000 Tachycardia, unspecified: Secondary | ICD-10-CM | POA: Insufficient documentation

## 2023-08-09 DIAGNOSIS — J440 Chronic obstructive pulmonary disease with acute lower respiratory infection: Secondary | ICD-10-CM | POA: Diagnosis not present

## 2023-08-09 DIAGNOSIS — R58 Hemorrhage, not elsewhere classified: Secondary | ICD-10-CM | POA: Diagnosis not present

## 2023-08-09 DIAGNOSIS — K922 Gastrointestinal hemorrhage, unspecified: Secondary | ICD-10-CM | POA: Insufficient documentation

## 2023-08-09 DIAGNOSIS — Z8673 Personal history of transient ischemic attack (TIA), and cerebral infarction without residual deficits: Secondary | ICD-10-CM | POA: Diagnosis not present

## 2023-08-09 DIAGNOSIS — I480 Paroxysmal atrial fibrillation: Secondary | ICD-10-CM | POA: Diagnosis not present

## 2023-08-09 DIAGNOSIS — K625 Hemorrhage of anus and rectum: Secondary | ICD-10-CM | POA: Diagnosis present

## 2023-08-09 DIAGNOSIS — C3411 Malignant neoplasm of upper lobe, right bronchus or lung: Secondary | ICD-10-CM | POA: Diagnosis not present

## 2023-08-09 DIAGNOSIS — J189 Pneumonia, unspecified organism: Secondary | ICD-10-CM | POA: Diagnosis not present

## 2023-08-09 LAB — CBC
HCT: 35.1 % — ABNORMAL LOW (ref 36.0–46.0)
Hemoglobin: 11.4 g/dL — ABNORMAL LOW (ref 12.0–15.0)
MCH: 29.1 pg (ref 26.0–34.0)
MCHC: 32.5 g/dL (ref 30.0–36.0)
MCV: 89.5 fL (ref 80.0–100.0)
Platelets: 226 10*3/uL (ref 150–400)
RBC: 3.92 MIL/uL (ref 3.87–5.11)
RDW: 14.3 % (ref 11.5–15.5)
WBC: 12.2 10*3/uL — ABNORMAL HIGH (ref 4.0–10.5)
nRBC: 0 % (ref 0.0–0.2)

## 2023-08-09 NOTE — ED Triage Notes (Signed)
Pt in via POV, reports fall at home today, states her legs just gave out on here, falling back on her bottom.  Since then w/ bright red rectal bleeding.  Denies any pain.  Takes Pradaxa.  A/Ox4, on 3L nasal cannula at baseline, vitals WDL.

## 2023-08-09 NOTE — ED Notes (Signed)
.  First Nurse note: pt BIB ems from home with c/o bright red rectal bleeding that started tonight.  EMS VS: 114/72 86 94% 3L [place] (normally on 3L at home)

## 2023-08-10 ENCOUNTER — Emergency Department
Admission: EM | Admit: 2023-08-10 | Discharge: 2023-08-10 | Disposition: A | Discharge status: Home / Self Care | Payer: Medicare Other | Attending: Emergency Medicine | Admitting: Emergency Medicine

## 2023-08-10 DIAGNOSIS — K922 Gastrointestinal hemorrhage, unspecified: Secondary | ICD-10-CM

## 2023-08-10 LAB — COMPREHENSIVE METABOLIC PANEL WITH GFR
ALT: 27 U/L (ref 0–44)
AST: 20 U/L (ref 15–41)
Albumin: 3.4 g/dL — ABNORMAL LOW (ref 3.5–5.0)
Alkaline Phosphatase: 73 U/L (ref 38–126)
Anion gap: 13 (ref 5–15)
BUN: 46 mg/dL — ABNORMAL HIGH (ref 8–23)
CO2: 28 mmol/L (ref 22–32)
Calcium: 8.9 mg/dL (ref 8.9–10.3)
Chloride: 97 mmol/L — ABNORMAL LOW (ref 98–111)
Creatinine, Ser: 1.3 mg/dL — ABNORMAL HIGH (ref 0.44–1.00)
GFR, Estimated: 41 mL/min — ABNORMAL LOW (ref >60)
Glucose, Bld: 125 mg/dL — ABNORMAL HIGH (ref 70–99)
Potassium: 4 mmol/L (ref 3.5–5.1)
Sodium: 138 mmol/L (ref 135–145)
Total Bilirubin: 0.7 mg/dL (ref <1.2)
Total Protein: 6.5 g/dL (ref 6.5–8.1)

## 2023-08-10 LAB — TYPE AND SCREEN
ABO/RH(D): A POS
Antibody Screen: NEGATIVE

## 2023-08-10 NOTE — Discharge Instructions (Addendum)
As we discussed, your lab work was quite reassuring overall.  It looks like you are a little bit dehydrated and should focus on drinking plenty of fluids (see the included information about rehydration).  However your hemoglobin level was normal and your physical exam was reassuring.  You do have some blood in your stool, but as we discussed, there is likely nothing that needs to be done about it immediately.  Please let your daughter know when you have additional blood in your stool, and follow-up with your GI doctor or regular primary care doctor at the next available opportunity.  Return immediately to the nearest emergency department if you develop new or worsening symptoms that concern you.

## 2023-08-10 NOTE — ED Provider Notes (Signed)
[facility] Provider Note    Event Date/Time   First MD Initiated Contact with Patient 08/10/23 [phone]     (approximate)   History   Rectal Bleeding, Fall, and Weakness   HPI [name] Douglas is a 82 y.o. female with extensive chronic medical issues including prior CVAs for which she takes Pradaxa.  She presents for evaluation of acute onset rectal bleeding.  She said that her knees gave out on her earlier in the day yesterday and it caused her to collapse and fall on her rear end.  At some point afterwards, she went to have a bowel movement and saw some bright red blood in the toilet and in the stool.  This happened a couple more times.  It was always a small volume of blood, but this was something new or different.  She told her daughter about it who insisted the patient come to the emergency department.  The patient denies having any abdominal pain.  She also is not having pain in her tailbone or she says anywhere at all.  She always is short of breath and uses supplemental oxygen.  She had hemorrhoid surgery many years ago and has not had any problems with hemorrhoids since that time.  No recent fever, no chest pain.  She last had a colonoscopy about 10 years ago and was told she no longer needed to have regular exams.     Physical Exam   Triage Vital Signs: ED Triage Vitals  Encounter Vitals Group     BP 08/09/23 2334 128/76     Systolic BP Percentile --      Diastolic BP Percentile --      Pulse Rate 08/09/23 2334 93     Resp 08/09/23 2334 (!) 22     Temp 08/09/23 2334 98.2 F (36.8 C)     Temp Source 08/09/23 2334 Oral     SpO2 08/09/23 2323 92 %     Weight 08/09/23 2334 90.7 kg (200 lb)     Height 08/09/23 2334 1.626 m (5\' 4" )     Head Circumference --      Peak Flow --      Pain Score 08/09/23 2334 0     Pain Loc --      Pain Education --      Exclude from Growth Chart --     Most recent vital signs: Vitals:   08/10/23 0507 08/10/23 0625   BP: (!) 144/60 (!) 140/54  Pulse: (!) 117 79  Resp: 20 17  Temp: 97.8 F (36.6 C) 98.1 F (36.7 C)  SpO2: 98% 100%    General: Awake, no distress.  Chronically ill but alert, sharp, and communicative. CV:  Good peripheral perfusion.  Regular rate and rhythm, normal heart sounds. Resp:  Normal effort. Speaking easily and comfortably, no accessory muscle usage nor intercostal retractions.  Some crackles throughout the lungs, no wheezing.  Patient using supplemental oxygen. Abd:  No distention.  Morbid obesity.  No tenderness at all throughout the abdomen even with deep palpation. Other:  Rectal exam notable for no external hemorrhoids, no gross blood evident but strongly Hemoccult positive.  ED chaperone present throughout exam.   ED Results / Procedures / Treatments   Labs (all labs ordered are listed, but only abnormal results are displayed) Labs Reviewed  COMPREHENSIVE METABOLIC PANEL - Abnormal; Notable for the following components:      Result Value   Chloride 97 (*)  Glucose, Bld 125 (*)    BUN 46 (*)    Creatinine, Ser 1.30 (*)    Albumin 3.4 (*)    GFR, Estimated 41 (*)    All other components within normal limits  CBC - Abnormal; Notable for the following components:   WBC 12.2 (*)    Hemoglobin 11.4 (*)    HCT 35.1 (*)    All other components within normal limits  POC OCCULT BLOOD, ED  TYPE AND SCREEN     EKG  ED ECG REPORT I, Loleta Rose, the attending physician, personally viewed and interpreted this ECG.  Date: 08/09/2023 EKG Time: 23: 32 Rate: 112 Rhythm: Sinus tachycardia with occasional PAC QRS Axis: normal Intervals: normal ST/T Wave abnormalities: Non-specific ST segment / T-wave changes, but no clear evidence of acute ischemia. Narrative Interpretation: no definitive evidence of acute ischemia; does not meet STEMI criteria.    PROCEDURES:  Critical Care performed: No  Procedures    IMPRESSION / MDM / ASSESSMENT AND PLAN / ED COURSE   I reviewed the triage vital signs and the nursing notes.                              Differential diagnosis includes, but is not limited to, diverticular bleed, hemorrhoids, AV malformation, neoplasm, traumatic injury.  Patient's presentation is most consistent with acute presentation with potential threat to life or bodily function.  Labs/studies ordered: Type and screen, CMP, CBC  Interventions/Medications given:  Medications - No data to display  (Note:  hospital course my include additional interventions and/or labs/studies not listed above.)   The patient has been in the emergency department for 7 and half hours.  She has passed a small amount of blood multiple times but it is not a continual issue and she is not having urgency to go regularly.  Her hemoglobin is normal.  She has some chronic kidney disease and her creatinine is little bit higher than usual but she is tolerating oral intake without difficulty.  Very slight leukocytosis.  Physical exam notable for Hemoccult positive stool but no gross blood.  I had extensive conversation with the patient and her daughter.  We talked about imaging but I explained it is very unlikely that a CT scan would reveal the cause of the bleed.  We also talked about whether the patient would benefit from hospitalization, and I strongly considered it.  However the patient very much does not want to be hospitalized I think it is extremely unlikely that she would receive anything other than serial H&H's in the short-term as she does not clearly need an emergent or even urgent colonoscopy.  The patient and her daughter are comfortable with the plan for discharge and expectant management with immediate return to the emergency department with the development of new or worsening symptoms versus outpatient follow-up with her GI doctor in [place].  I think that is a very reasonable plan and I gave strict return precautions should she develop worsening  bleeding or any new medical issues that concern them.         FINAL CLINICAL IMPRESSION(S) / ED DIAGNOSES   Final diagnoses:  Acute lower GI bleeding     Rx / DC Orders   ED Discharge Orders     None        Note:  This document was prepared using Dragon voice recognition software and may include unintentional dictation errors.   York Cerise,  Kandee Keen, MD 08/10/23 [phone]

## 2023-08-12 DIAGNOSIS — J440 Chronic obstructive pulmonary disease with acute lower respiratory infection: Secondary | ICD-10-CM | POA: Diagnosis not present

## 2023-08-12 DIAGNOSIS — J441 Chronic obstructive pulmonary disease with (acute) exacerbation: Secondary | ICD-10-CM | POA: Diagnosis not present

## 2023-08-12 DIAGNOSIS — Z8673 Personal history of transient ischemic attack (TIA), and cerebral infarction without residual deficits: Secondary | ICD-10-CM | POA: Diagnosis not present

## 2023-08-12 DIAGNOSIS — I480 Paroxysmal atrial fibrillation: Secondary | ICD-10-CM | POA: Diagnosis not present

## 2023-08-12 DIAGNOSIS — J189 Pneumonia, unspecified organism: Secondary | ICD-10-CM | POA: Diagnosis not present

## 2023-08-12 DIAGNOSIS — C3411 Malignant neoplasm of upper lobe, right bronchus or lung: Secondary | ICD-10-CM | POA: Diagnosis not present

## 2023-08-14 ENCOUNTER — Encounter: Payer: Self-pay | Admitting: Emergency Medicine

## 2023-08-14 ENCOUNTER — Emergency Department: Payer: Medicare Other

## 2023-08-14 ENCOUNTER — Inpatient Hospital Stay
Admission: EM | Admit: 2023-08-14 | Discharge: 2023-08-21 | DRG: 871 | Disposition: A | Discharge status: Home Health | Payer: Medicare Other | Attending: Obstetrics and Gynecology | Admitting: Obstetrics and Gynecology

## 2023-08-14 ENCOUNTER — Other Ambulatory Visit: Payer: Self-pay

## 2023-08-14 DIAGNOSIS — Z66 Do not resuscitate: Secondary | ICD-10-CM | POA: Diagnosis present

## 2023-08-14 DIAGNOSIS — I5032 Chronic diastolic (congestive) heart failure: Secondary | ICD-10-CM | POA: Diagnosis present

## 2023-08-14 DIAGNOSIS — I700 Atherosclerosis of aorta: Secondary | ICD-10-CM | POA: Diagnosis present

## 2023-08-14 DIAGNOSIS — J9690 Respiratory failure, unspecified, unspecified whether with hypoxia or hypercapnia: Secondary | ICD-10-CM | POA: Diagnosis not present

## 2023-08-14 DIAGNOSIS — Z8619 Personal history of other infectious and parasitic diseases: Secondary | ICD-10-CM

## 2023-08-14 DIAGNOSIS — Z9981 Dependence on supplemental oxygen: Secondary | ICD-10-CM

## 2023-08-14 DIAGNOSIS — I4719 Other supraventricular tachycardia: Secondary | ICD-10-CM | POA: Diagnosis not present

## 2023-08-14 DIAGNOSIS — Z8249 Family history of ischemic heart disease and other diseases of the circulatory system: Secondary | ICD-10-CM

## 2023-08-14 DIAGNOSIS — R079 Chest pain, unspecified: Secondary | ICD-10-CM | POA: Diagnosis not present

## 2023-08-14 DIAGNOSIS — J9621 Acute and chronic respiratory failure with hypoxia: Secondary | ICD-10-CM | POA: Diagnosis present

## 2023-08-14 DIAGNOSIS — C3411 Malignant neoplasm of upper lobe, right bronchus or lung: Secondary | ICD-10-CM | POA: Diagnosis present

## 2023-08-14 DIAGNOSIS — Z801 Family history of malignant neoplasm of trachea, bronchus and lung: Secondary | ICD-10-CM

## 2023-08-14 DIAGNOSIS — I110 Hypertensive heart disease with heart failure: Secondary | ICD-10-CM | POA: Diagnosis present

## 2023-08-14 DIAGNOSIS — Z90710 Acquired absence of both cervix and uterus: Secondary | ICD-10-CM

## 2023-08-14 DIAGNOSIS — K219 Gastro-esophageal reflux disease without esophagitis: Secondary | ICD-10-CM | POA: Diagnosis present

## 2023-08-14 DIAGNOSIS — G894 Chronic pain syndrome: Secondary | ICD-10-CM | POA: Diagnosis present

## 2023-08-14 DIAGNOSIS — Z79890 Hormone replacement therapy (postmenopausal): Secondary | ICD-10-CM | POA: Diagnosis not present

## 2023-08-14 DIAGNOSIS — E7800 Pure hypercholesterolemia, unspecified: Secondary | ICD-10-CM | POA: Diagnosis present

## 2023-08-14 DIAGNOSIS — B029 Zoster without complications: Secondary | ICD-10-CM | POA: Diagnosis present

## 2023-08-14 DIAGNOSIS — D849 Immunodeficiency, unspecified: Secondary | ICD-10-CM | POA: Diagnosis present

## 2023-08-14 DIAGNOSIS — A419 Sepsis, unspecified organism: Principal | ICD-10-CM | POA: Diagnosis present

## 2023-08-14 DIAGNOSIS — Z794 Long term (current) use of insulin: Secondary | ICD-10-CM

## 2023-08-14 DIAGNOSIS — J690 Pneumonitis due to inhalation of food and vomit: Secondary | ICD-10-CM | POA: Diagnosis not present

## 2023-08-14 DIAGNOSIS — I3481 Nonrheumatic mitral (valve) annulus calcification: Secondary | ICD-10-CM | POA: Diagnosis not present

## 2023-08-14 DIAGNOSIS — E872 Acidosis, unspecified: Secondary | ICD-10-CM | POA: Diagnosis present

## 2023-08-14 DIAGNOSIS — E039 Hypothyroidism, unspecified: Secondary | ICD-10-CM | POA: Diagnosis present

## 2023-08-14 DIAGNOSIS — Z85118 Personal history of other malignant neoplasm of bronchus and lung: Secondary | ICD-10-CM

## 2023-08-14 DIAGNOSIS — I639 Cerebral infarction, unspecified: Secondary | ICD-10-CM | POA: Diagnosis present

## 2023-08-14 DIAGNOSIS — I480 Paroxysmal atrial fibrillation: Secondary | ICD-10-CM | POA: Diagnosis not present

## 2023-08-14 DIAGNOSIS — I10 Essential (primary) hypertension: Secondary | ICD-10-CM | POA: Diagnosis present

## 2023-08-14 DIAGNOSIS — L821 Other seborrheic keratosis: Secondary | ICD-10-CM | POA: Diagnosis present

## 2023-08-14 DIAGNOSIS — Z8673 Personal history of transient ischemic attack (TIA), and cerebral infarction without residual deficits: Secondary | ICD-10-CM

## 2023-08-14 DIAGNOSIS — Z923 Personal history of irradiation: Secondary | ICD-10-CM

## 2023-08-14 DIAGNOSIS — R0789 Other chest pain: Secondary | ICD-10-CM | POA: Diagnosis not present

## 2023-08-14 DIAGNOSIS — I2510 Atherosclerotic heart disease of native coronary artery without angina pectoris: Secondary | ICD-10-CM | POA: Diagnosis not present

## 2023-08-14 DIAGNOSIS — J189 Pneumonia, unspecified organism: Secondary | ICD-10-CM | POA: Diagnosis not present

## 2023-08-14 DIAGNOSIS — Z1152 Encounter for screening for COVID-19: Secondary | ICD-10-CM | POA: Diagnosis not present

## 2023-08-14 DIAGNOSIS — Z515 Encounter for palliative care: Secondary | ICD-10-CM

## 2023-08-14 DIAGNOSIS — Z96653 Presence of artificial knee joint, bilateral: Secondary | ICD-10-CM | POA: Diagnosis present

## 2023-08-14 DIAGNOSIS — R911 Solitary pulmonary nodule: Secondary | ICD-10-CM | POA: Diagnosis not present

## 2023-08-14 DIAGNOSIS — E669 Obesity, unspecified: Secondary | ICD-10-CM | POA: Diagnosis present

## 2023-08-14 DIAGNOSIS — I493 Ventricular premature depolarization: Secondary | ICD-10-CM | POA: Diagnosis present

## 2023-08-14 DIAGNOSIS — J441 Chronic obstructive pulmonary disease with (acute) exacerbation: Secondary | ICD-10-CM | POA: Diagnosis not present

## 2023-08-14 DIAGNOSIS — C3490 Malignant neoplasm of unspecified part of unspecified bronchus or lung: Secondary | ICD-10-CM | POA: Insufficient documentation

## 2023-08-14 DIAGNOSIS — R5381 Other malaise: Secondary | ICD-10-CM | POA: Diagnosis not present

## 2023-08-14 DIAGNOSIS — E1151 Type 2 diabetes mellitus with diabetic peripheral angiopathy without gangrene: Secondary | ICD-10-CM | POA: Diagnosis present

## 2023-08-14 DIAGNOSIS — R140 Abdominal distension (gaseous): Secondary | ICD-10-CM | POA: Diagnosis not present

## 2023-08-14 DIAGNOSIS — R000 Tachycardia, unspecified: Secondary | ICD-10-CM | POA: Diagnosis not present

## 2023-08-14 DIAGNOSIS — I471 Supraventricular tachycardia, unspecified: Secondary | ICD-10-CM | POA: Diagnosis not present

## 2023-08-14 DIAGNOSIS — I951 Orthostatic hypotension: Secondary | ICD-10-CM | POA: Diagnosis present

## 2023-08-14 DIAGNOSIS — F17210 Nicotine dependence, cigarettes, uncomplicated: Secondary | ICD-10-CM | POA: Diagnosis present

## 2023-08-14 DIAGNOSIS — Z7951 Long term (current) use of inhaled steroids: Secondary | ICD-10-CM

## 2023-08-14 DIAGNOSIS — E119 Type 2 diabetes mellitus without complications: Secondary | ICD-10-CM

## 2023-08-14 DIAGNOSIS — J440 Chronic obstructive pulmonary disease with acute lower respiratory infection: Secondary | ICD-10-CM | POA: Diagnosis not present

## 2023-08-14 DIAGNOSIS — Z79899 Other long term (current) drug therapy: Secondary | ICD-10-CM

## 2023-08-14 DIAGNOSIS — R0602 Shortness of breath: Secondary | ICD-10-CM | POA: Diagnosis not present

## 2023-08-14 DIAGNOSIS — Z833 Family history of diabetes mellitus: Secondary | ICD-10-CM

## 2023-08-14 LAB — COMPREHENSIVE METABOLIC PANEL
ALT: 31 U/L (ref 0–44)
AST: 25 U/L (ref 15–41)
Albumin: 3.3 g/dL — ABNORMAL LOW (ref 3.5–5.0)
Alkaline Phosphatase: 73 U/L (ref 38–126)
Anion gap: 11 (ref 5–15)
BUN: 32 mg/dL — ABNORMAL HIGH (ref 8–23)
CO2: 28 mmol/L (ref 22–32)
Calcium: 8.8 mg/dL — ABNORMAL LOW (ref 8.9–10.3)
Chloride: 98 mmol/L (ref 98–111)
Creatinine, Ser: 1.04 mg/dL — ABNORMAL HIGH (ref 0.44–1.00)
GFR, Estimated: 53 mL/min — ABNORMAL LOW (ref >60)
Glucose, Bld: 124 mg/dL — ABNORMAL HIGH (ref 70–99)
Potassium: 4.1 mmol/L (ref 3.5–5.1)
Sodium: 137 mmol/L (ref 135–145)
Total Bilirubin: 0.7 mg/dL (ref <1.2)
Total Protein: 6.4 g/dL — ABNORMAL LOW (ref 6.5–8.1)

## 2023-08-14 LAB — CBC WITH DIFFERENTIAL/PLATELET
Abs Immature Granulocytes: 0.5 10*3/uL — ABNORMAL HIGH (ref 0.00–0.07)
Basophils Absolute: 0.1 10*3/uL (ref 0.0–0.1)
Basophils Relative: 0 %
Eosinophils Absolute: 0.1 10*3/uL (ref 0.0–0.5)
Eosinophils Relative: 1 %
HCT: 34.4 % — ABNORMAL LOW (ref 36.0–46.0)
Hemoglobin: 11.8 g/dL — ABNORMAL LOW (ref 12.0–15.0)
Immature Granulocytes: 4 %
Lymphocytes Relative: 5 %
Lymphs Abs: 0.5 10*3/uL — ABNORMAL LOW (ref 0.7–4.0)
MCH: 30.6 pg (ref 26.0–34.0)
MCHC: 34.3 g/dL (ref 30.0–36.0)
MCV: 89.1 fL (ref 80.0–100.0)
Monocytes Absolute: 0.5 10*3/uL (ref 0.1–1.0)
Monocytes Relative: 5 %
Neutro Abs: 9.9 10*3/uL — ABNORMAL HIGH (ref 1.7–7.7)
Neutrophils Relative %: 85 %
Platelets: 273 10*3/uL (ref 150–400)
RBC: 3.86 MIL/uL — ABNORMAL LOW (ref 3.87–5.11)
RDW: 14.3 % (ref 11.5–15.5)
WBC: 11.6 10*3/uL — ABNORMAL HIGH (ref 4.0–10.5)
nRBC: 0 % (ref 0.0–0.2)

## 2023-08-14 LAB — TROPONIN I (HIGH SENSITIVITY)
Troponin I (High Sensitivity): 34 ng/L — ABNORMAL HIGH (ref <18)
Troponin I (High Sensitivity): 36 ng/L — ABNORMAL HIGH (ref <18)

## 2023-08-14 LAB — RESP PANEL BY RT-PCR (RSV, FLU A&B, COVID)  RVPGX2
Influenza A by PCR: NEGATIVE
Influenza B by PCR: NEGATIVE
Resp Syncytial Virus by PCR: NEGATIVE
SARS Coronavirus 2 by RT PCR: NEGATIVE

## 2023-08-14 LAB — D-DIMER, QUANTITATIVE: D-Dimer, Quant: 1.63 ug{FEU}/mL — ABNORMAL HIGH (ref 0.00–0.50)

## 2023-08-14 LAB — LACTIC ACID, PLASMA
Lactic Acid, Venous: 1.7 mmol/L (ref 0.5–1.9)
Lactic Acid, Venous: 2.7 mmol/L (ref 0.5–1.9)

## 2023-08-14 LAB — LIPASE, BLOOD: Lipase: 46 U/L (ref 11–51)

## 2023-08-14 LAB — BRAIN NATRIURETIC PEPTIDE: B Natriuretic Peptide: 168.2 pg/mL — ABNORMAL HIGH (ref 0.0–100.0)

## 2023-08-14 MED ORDER — LORATADINE 10 MG PO TABS
10.0000 mg | ORAL_TABLET | Freq: Every day | ORAL | Status: DC
  Administered 2023-08-15 – 2023-08-20 (×6): 10 mg via ORAL
  Filled 2023-08-14 (×6): qty 1

## 2023-08-14 MED ORDER — ATORVASTATIN CALCIUM 80 MG PO TABS
80.0000 mg | ORAL_TABLET | Freq: Every day | ORAL | Status: DC
  Administered 2023-08-14 – 2023-08-20 (×7): 80 mg via ORAL
  Filled 2023-08-14: qty 4
  Filled 2023-08-14 (×2): qty 1
  Filled 2023-08-14 (×6): qty 4

## 2023-08-14 MED ORDER — DONEPEZIL HCL 5 MG PO TABS
10.0000 mg | ORAL_TABLET | Freq: Every day | ORAL | Status: DC
  Administered 2023-08-14 – 2023-08-19 (×6): 10 mg via ORAL
  Filled 2023-08-14 (×6): qty 2

## 2023-08-14 MED ORDER — MOMETASONE FURO-FORMOTEROL FUM 100-5 MCG/ACT IN AERO
2.0000 | INHALATION_SPRAY | Freq: Two times a day (BID) | RESPIRATORY_TRACT | Status: DC
  Administered 2023-08-14 – 2023-08-20 (×11): 2 via RESPIRATORY_TRACT
  Filled 2023-08-14 (×2): qty 8.8

## 2023-08-14 MED ORDER — AZITHROMYCIN 500 MG PO TABS: 500.0000 mg | ORAL_TABLET | Freq: Every day | ORAL | Status: DC

## 2023-08-14 MED ORDER — SODIUM CHLORIDE 0.9 % IV SOLN
1.0000 g | Freq: Once | INTRAVENOUS | Status: AC
Start: 2023-08-14 — End: 2023-08-14
  Administered 2023-08-14: 1 g via INTRAVENOUS
  Filled 2023-08-14: qty 10

## 2023-08-14 MED ORDER — HYDROMORPHONE HCL 1 MG/ML IJ SOLN
0.5000 mg | INTRAMUSCULAR | Status: DC | PRN
  Administered 2023-08-14 – 2023-08-16 (×3): 0.5 mg via INTRAVENOUS
  Filled 2023-08-14 (×3): qty 0.5

## 2023-08-14 MED ORDER — GUAIFENESIN-DM 100-10 MG/5ML PO SYRP
5.0000 mL | ORAL_SOLUTION | ORAL | Status: DC | PRN
  Administered 2023-08-15 – 2023-08-18 (×8): 5 mL via ORAL
  Filled 2023-08-14 (×8): qty 10

## 2023-08-14 MED ORDER — IPRATROPIUM-ALBUTEROL 0.5-2.5 (3) MG/3ML IN SOLN
3.0000 mL | Freq: Once | RESPIRATORY_TRACT | Status: AC
  Administered 2023-08-14: 3 mL via RESPIRATORY_TRACT
  Filled 2023-08-14: qty 3

## 2023-08-14 MED ORDER — VENLAFAXINE HCL ER 75 MG PO CP24
150.0000 mg | ORAL_CAPSULE | Freq: Every day | ORAL | Status: DC
  Administered 2023-08-15 – 2023-08-21 (×7): 150 mg via ORAL
  Filled 2023-08-14 (×7): qty 2

## 2023-08-14 MED ORDER — ONDANSETRON HCL 4 MG/2ML IJ SOLN: 4.0000 mg | Freq: Four times a day (QID) | INTRAMUSCULAR | Status: DC | PRN

## 2023-08-14 MED ORDER — ALBUTEROL SULFATE (2.5 MG/3ML) 0.083% IN NEBU
3.0000 mL | INHALATION_SOLUTION | RESPIRATORY_TRACT | Status: DC | PRN
  Administered 2023-08-16: 3 mL via RESPIRATORY_TRACT
  Filled 2023-08-14: qty 3

## 2023-08-14 MED ORDER — METHYLPREDNISOLONE SODIUM SUCC 125 MG IJ SOLR
125.0000 mg | Freq: Once | INTRAMUSCULAR | Status: AC
  Administered 2023-08-14: 125 mg via INTRAVENOUS
  Filled 2023-08-14: qty 2

## 2023-08-14 MED ORDER — LEVOTHYROXINE SODIUM 88 MCG PO TABS
88.0000 ug | ORAL_TABLET | Freq: Every day | ORAL | Status: DC
Start: 2023-08-15 — End: 2023-08-20
  Administered 2023-08-15 – 2023-08-20 (×6): 88 ug via ORAL
  Filled 2023-08-14 (×6): qty 1

## 2023-08-14 MED ORDER — IOHEXOL 350 MG/ML SOLN
75.0000 mL | Freq: Once | INTRAVENOUS | Status: AC | PRN
  Administered 2023-08-14: 75 mL via INTRAVENOUS

## 2023-08-14 MED ORDER — POLYSACCHARIDE IRON COMPLEX 150 MG PO CAPS
150.0000 mg | ORAL_CAPSULE | Freq: Every day | ORAL | Status: DC
  Administered 2023-08-15 – 2023-08-20 (×6): 150 mg via ORAL
  Filled 2023-08-14 (×6): qty 1

## 2023-08-14 MED ORDER — BUDESONIDE 0.25 MG/2ML IN SUSP
0.2500 mg | Freq: Every day | RESPIRATORY_TRACT | Status: DC
Start: 2023-08-14 — End: 2023-08-14

## 2023-08-14 MED ORDER — FUROSEMIDE 20 MG PO TABS
20.0000 mg | ORAL_TABLET | ORAL | Status: DC
  Administered 2023-08-15 – 2023-08-19 (×3): 20 mg via ORAL
  Filled 2023-08-14 (×4): qty 1

## 2023-08-14 MED ORDER — SODIUM CHLORIDE 0.9 % IV SOLN
500.0000 mg | Freq: Once | INTRAVENOUS | Status: AC
Start: 2023-08-14 — End: 2023-08-14
  Administered 2023-08-14: 500 mg via INTRAVENOUS
  Filled 2023-08-14: qty 5

## 2023-08-14 MED ORDER — ACETAMINOPHEN 325 MG PO TABS
650.0000 mg | ORAL_TABLET | Freq: Once | ORAL | Status: AC
  Administered 2023-08-14: 650 mg via ORAL
  Filled 2023-08-14: qty 2

## 2023-08-14 MED ORDER — ONDANSETRON HCL 4 MG/2ML IJ SOLN
4.0000 mg | Freq: Once | INTRAMUSCULAR | Status: AC
  Administered 2023-08-14: 4 mg via INTRAVENOUS
  Filled 2023-08-14: qty 2

## 2023-08-14 MED ORDER — AMLODIPINE BESYLATE 10 MG PO TABS
10.0000 mg | ORAL_TABLET | Freq: Every day | ORAL | Status: DC
  Administered 2023-08-14 – 2023-08-18 (×5): 10 mg via ORAL
  Filled 2023-08-14 (×2): qty 1
  Filled 2023-08-14: qty 2
  Filled 2023-08-14 (×2): qty 1

## 2023-08-14 MED ORDER — SODIUM CHLORIDE 0.9 % IV SOLN: 2.0000 g | INTRAVENOUS | Status: DC

## 2023-08-14 MED ORDER — DEXAMETHASONE 4 MG PO TABS
4.0000 mg | ORAL_TABLET | Freq: Every day | ORAL | Status: DC
  Administered 2023-08-14 – 2023-08-20 (×7): 4 mg via ORAL
  Filled 2023-08-14 (×7): qty 1

## 2023-08-14 MED ORDER — FLUTICASONE FUROATE 50 MCG/ACT IN AEPB: 1.0000 | INHALATION_SPRAY | Freq: Two times a day (BID) | RESPIRATORY_TRACT | Status: DC

## 2023-08-14 MED ORDER — SODIUM CHLORIDE 0.9 % IV SOLN
3.0000 g | Freq: Four times a day (QID) | INTRAVENOUS | Status: DC
  Administered 2023-08-14 – 2023-08-20 (×24): 3 g via INTRAVENOUS
  Filled 2023-08-14 (×26): qty 8

## 2023-08-14 MED ORDER — FLECAINIDE ACETATE 50 MG PO TABS
50.0000 mg | ORAL_TABLET | Freq: Two times a day (BID) | ORAL | Status: DC
  Administered 2023-08-14 – 2023-08-20 (×13): 50 mg via ORAL
  Filled 2023-08-14 (×13): qty 1

## 2023-08-14 MED ORDER — DABIGATRAN ETEXILATE MESYLATE 150 MG PO CAPS
150.0000 mg | ORAL_CAPSULE | Freq: Two times a day (BID) | ORAL | Status: DC
  Administered 2023-08-14 – 2023-08-20 (×12): 150 mg via ORAL
  Filled 2023-08-14 (×12): qty 1

## 2023-08-14 MED ORDER — IPRATROPIUM-ALBUTEROL 0.5-2.5 (3) MG/3ML IN SOLN
3.0000 mL | Freq: Four times a day (QID) | RESPIRATORY_TRACT | Status: DC
  Administered 2023-08-14 – 2023-08-16 (×9): 3 mL via RESPIRATORY_TRACT
  Filled 2023-08-14 (×9): qty 3

## 2023-08-14 MED ORDER — DOXYCYCLINE HYCLATE 100 MG PO TABS
100.0000 mg | ORAL_TABLET | Freq: Two times a day (BID) | ORAL | Status: DC
  Administered 2023-08-15 – 2023-08-20 (×11): 100 mg via ORAL
  Filled 2023-08-14 (×11): qty 1

## 2023-08-14 MED ORDER — ONDANSETRON HCL 4 MG PO TABS: 4.0000 mg | ORAL_TABLET | Freq: Four times a day (QID) | ORAL | Status: DC | PRN

## 2023-08-14 MED ORDER — FENTANYL CITRATE PF 50 MCG/ML IJ SOSY
50.0000 ug | PREFILLED_SYRINGE | Freq: Once | INTRAMUSCULAR | Status: AC
  Administered 2023-08-14: 50 ug via INTRAVENOUS
  Filled 2023-08-14: qty 1

## 2023-08-14 MED ORDER — BUDESONIDE 0.25 MG/2ML IN SUSP
0.2500 mg | Freq: Two times a day (BID) | RESPIRATORY_TRACT | Status: DC
  Administered 2023-08-14 – 2023-08-16 (×4): 0.25 mg via RESPIRATORY_TRACT
  Filled 2023-08-14 (×4): qty 2

## 2023-08-14 MED ORDER — DIPHENHYDRAMINE HCL 25 MG PO CAPS: 25.0000 mg | ORAL_CAPSULE | Freq: Every evening | ORAL | Status: DC | PRN

## 2023-08-14 NOTE — ED Provider Notes (Signed)
  Physical Exam  BP (!) 143/53   Pulse (!) 106   Temp 98.6 F (37 C) (Oral)   Resp (!) 29   SpO2 98% Comment: 3L [place]  Physical Exam  Procedures  Procedures  ED Course / MDM   Clinical Course as of 08/14/23 0843  Wed Aug 14, 2023  7829 CT Angio Chest PE W and/or Wo Contrast Concern for aspiration pneumonia.  No PE [DW]    Clinical Course User Index [DW] Janith Lima, MD   Medical Decision Making Amount and/or Complexity of Data Reviewed Labs: ordered. Radiology: ordered. Decision-making details documented in ED Course.  Risk OTC drugs. Prescription drug management. Decision regarding hospitalization.   Received patient in signout.  82 year old female with history of lung cancer, COPD presenting today for shortness of breath and right-sided chest pain.  Normally on 1 L chronically but was increased to 3 L per EMS.  Initial laboratory workup showed mild leukocytosis and chest x-ray with relatively stable appearance.  Signed out pending results of laboratory workup and suspected admission at minimum for COPD exacerbation.  Patient had D-dimer that came back elevated at 1.63.  Will get CTA chest follow-up to evaluate for PE.  Lactic acid normal.  CTA chest with no evidence of PE.  There was concerning changes for possible aspiration pneumonia which would fit with her productive cough and acute on chronic hypoxic respiratory failure.  Patient started on ceftriaxone azithromycin and admitted to hospitalist for further care.     Janith Lima, MD 08/14/23 [phone]

## 2023-08-14 NOTE — H&P (Addendum)
History and Physical    [name] Douglas QMV:[id number] DOB: [birth year] DOA: 08/14/2023  PCP: Mick Sell, MD (Confirm with patient/family/NH records and if not entered, this has to be entered at [facility] point of entry) Patient coming from: Home  I have personally briefly reviewed patient's old medical records in [facility]  Chief Complaint: Cough, chest pain  HPI: [name] Douglas is a 82 y.o. female with medical history significant of recently diagnosed non-small cell carcinoma of RUL lung status post radiation therapy in September 2024, PAF on Pradaxa and flecainide, COPD Gold stage I, chronic hypoxic respiratory failure on 1 L continuously at home, IIDM, HTN, chronic HFpEF, presented with dry cough and right-sided chest pain.  Symptoms started 3 days ago patient started develop sharp like chest pain and a new cough.  Denied any phlegm production no hemoptysis this time no wheezing.  Patient denied any coughing or choking after eating or drinking but does admitted that frequently she feels " food stuck in the chest", but denied any history of aspiration pneumonia.  She described the chest pain as sharp like on the right side of the lower rib cage, worsening with cough and deep breath.  Denies any fever or chills.  ED Course: Afebrile, tachycardia 106, blood pressure 140/50, O2 saturation 98% on 3 L.  Blood work showed WBC 11.6, hemoglobin 11.8, COVID-negative, creatinine 1.0, CTA negative for PE but right lung nodular changes in upper right lung and right talar, and right lower lobe aspiration pneumonia on right lower lobe.  Was given ceftriaxone and azithromycin in the ED  Review of Systems: As per HPI otherwise 14 point review of systems negative.    Past Medical History:  Diagnosis Date   Anxiety    Arthritis    "shoulders" (08/20/2014)   Asthma    Chronic lower back pain    Complication of anesthesia    "agitated & restless after knee replacement in 2008"   COPD (chronic  obstructive pulmonary disease) (HCC)    DDD (degenerative disc disease), lumbar    Depression    Diabetes mellitus without complication (HCC)    Diverticulosis    Family history of adverse reaction to anesthesia    GERD (gastroesophageal reflux disease)    High cholesterol    Hypertension    Hypothyroidism    Memory loss    mild   On home oxygen therapy    "2L at night" (08/20/2014)   PAF (paroxysmal atrial fibrillation) (HCC)    Peripheral vascular disease (HCC)    Pre-diabetes    Shortness of breath dyspnea    with exertion   Stroke (HCC)    mild, no deficits    Past Surgical History:  Procedure Laterality Date   CARDIAC CATHETERIZATION  ~ 2007   EXCISIONAL HEMORRHOIDECTOMY  1960's   EYE SURGERY Bilateral    Cataract extraction with IOL   KNEE ARTHROSCOPY Left X 2 <2008   KNEE ARTHROSCOPY WITH SUBCHONDROPLASTY Right 04/19/2016   Procedure: KNEE ARTHROSCOPY WITH SUBCHONDROPLASTY, PARTIAL MENISCECTOMY;  Surgeon: Kennedy Bucker, MD;  Location: [facility] ORS;  Service: Orthopedics;  Laterality: Right;   LOOP RECORDER INSERTION N/A 01/02/2018   Procedure: LOOP RECORDER INSERTION;  Surgeon: Duke Salvia, MD;  Location: [facility] INVASIVE CV LAB;  Service: Cardiovascular;  Laterality: N/A;   LUMBAR LAMINECTOMY/DECOMPRESSION MICRODISCECTOMY N/A 02/06/2021   Procedure: Lumbar Three-Four, Lumbar Four-Five Laminectomy and Foraminotomy;  Surgeon: Barnett Abu, MD;  Location: [facility] OR;  Service: Neurosurgery;  Laterality: N/A;  SHOULDER OPEN ROTATOR CUFF REPAIR Right 2001   TONSILLECTOMY  ~ 1950   TOTAL KNEE ARTHROPLASTY Left 2008   TOTAL KNEE ARTHROPLASTY Right 07/03/2016   Procedure: TOTAL KNEE ARTHROPLASTY;  Surgeon: Kennedy Bucker, MD;  Location: [facility] ORS;  Service: Orthopedics;  Laterality: Right;   TUBAL LIGATION  1973   VAGINAL HYSTERECTOMY  1974     reports that she has quit smoking. Her smoking use included cigarettes. She has a 13.3 pack-year smoking history. She has never used smokeless  tobacco. She reports that she does not drink alcohol and does not use drugs.  Allergies  Allergen Reactions   Trazodone And Nefazodone Other (See Comments)    Agitation   Heparin Nausea And Vomiting    Family History  Problem Relation Age of Onset   Diabetes Mother    Hypertension Mother    Lung cancer Mother    Congestive Heart Failure Father    Congestive Heart Failure Sister       Prior to Admission medications   Medication Sig Start Date End Date Taking? Authorizing Provider  dexamethasone (DECADRON) 4 MG tablet Take 4 mg by mouth daily. 08/06/23 09/05/23 Yes [provider]  sulfamethoxazole-trimethoprim (BACTRIM) 400-80 MG tablet Take 1 tablet by mouth 3 (three) times a week.  Take 1 tablet (80 mg of trimethoprim total) by mouth 3 (three) times a week for 39 days 08/07/23 09/15/23 Yes [provider]  albuterol (VENTOLIN HFA) 108 (90 Base) MCG/ACT inhaler Inhale 2 puffs into the lungs every 4 (four) hours as needed.    [provider]  amLODipine (NORVASC) 10 MG tablet Take 1 tablet (10 mg total) by mouth daily. Increased from 5 mg. 07/27/23   Darlin Priestly, MD  atorvastatin (LIPITOR) 80 MG tablet TAKE 1 TABLET BY MOUTH DAILY AT 6PM 02/13/23   McDonough, Lauren K, PA-C  budesonide (PULMICORT) 0.25 MG/2ML nebulizer solution Take 2 mLs (0.25 mg total) by nebulization daily. 07/15/23 07/14/24  Emeline General, MD  dabigatran (PRADAXA) 150 MG CAPS capsule Take 1 capsule (150 mg total) by mouth every 12 (twelve) hours. 01/02/23 01/02/24  Gillis Santa, MD  diphenhydramine-acetaminophen (TYLENOL PM) 25-500 MG TABS tablet Take 1 tablet by mouth at bedtime as needed.    [provider]  donepezil (ARICEPT) 10 MG tablet TAKE ONE TABLET BY MOUTH AT BEDTIME 02/13/23   McDonough, Salomon Fick, PA-C  flecainide (TAMBOCOR) 50 MG tablet TAKE 1 TABLET BY MOUTH TWICE A DAY 06/20/23   Duke Salvia, MD  Fluticasone Furoate (ARNUITY ELLIPTA) 50 MCG/ACT AEPB Inhale 1 puff into  the lungs 2 (two) times daily. 07/15/23 07/14/24  Emeline General, MD  furosemide (LASIX) 20 MG tablet TAKE 1 TABLET BY MOUTH EVERY OTHER DAY 02/28/23   Duke Salvia, MD  ipratropium-albuterol (DUONEB) 0.5-2.5 (3) MG/3ML SOLN Take 3 mLs by nebulization every 4 (four) hours as needed. 07/27/23   Darlin Priestly, MD  iron polysaccharides (NIFEREX) 150 MG capsule Take 150 mg by mouth daily.    [provider]  levothyroxine (SYNTHROID) 88 MCG tablet TAKE 1 TABLET BY MOUTH DAILY 06/10/23   Lyndon Code, MD  loratadine (ALLERGY RELIEF) 10 MG tablet Take 10 mg by mouth daily.    [provider]  mometasone-formoterol (DULERA) 100-5 MCG/ACT AERO Inhale 2 puffs into the lungs 2 (two) times daily. 07/15/23   Emeline General, MD  mometasone-formoterol (DULERA) 100-5 MCG/ACT AERO Inhale 2 puffs into the lungs daily. 07/15/23   Emeline General,  MD  triamcinolone ointment (KENALOG) 0.1 % Apply 1 Application topically 2 (two) times daily.    [provider]  venlafaxine XR (EFFEXOR-XR) 75 MG 24 hr capsule TAKE 2 CAPSULES BY MOUTH DAILY Patient taking differently: 150 mg daily with breakfast. TAKE 2 CAPSULES BY MOUTH DAILY. 02/13/23   McDonough, Salomon Fick, PA-C  vitamin B-12 (CYANOCOBALAMIN) 500 MCG tablet Take 500 mcg by mouth daily.    [provider]  vitamin C (ASCORBIC ACID) 250 MG tablet Take 500 mg by mouth daily.    [provider]  Vitamin D, Ergocalciferol, (DRISDOL) 1.25 MG (50000 UNIT) CAPS capsule Take 1 capsule by mouth weekly 11/08/22   Carlean Jews, PA-C    Physical Exam: Vitals:   08/14/23 0558 08/14/23 0640 08/14/23 0900  BP: (!) 143/53  (!) 166/51  Pulse: (!) 106  92  Resp: (!) 29    Temp: 98.6 F (37 C)    TempSrc: Oral    SpO2: 98% 98% 100%    Constitutional: NAD, calm, comfortable Vitals:   08/14/23 0558 08/14/23 0640 08/14/23 0900  BP: (!) 143/53  (!) 166/51  Pulse: (!) 106  92  Resp: (!) 29    Temp: 98.6 F (37 C)    TempSrc: Oral     SpO2: 98% 98% 100%   Eyes: PERRL, lids and conjunctivae normal ENMT: Mucous membranes are moist. Posterior pharynx clear of any exudate or lesions.Normal dentition.  Neck: normal, supple, no masses, no thyromegaly Respiratory: Diminished breathing sound bilaterally, scattered wheezing, increasing crackle on right lower field, increasing breathing effort, No accessory muscle use.  Cardiovascular: Regular rate and rhythm, no murmurs / rubs / gallops. No extremity edema. 2+ pedal pulses. No carotid bruits.  Abdomen: no tenderness, no masses palpated. No hepatosplenomegaly. Bowel sounds positive.  Musculoskeletal: no clubbing / cyanosis. No joint deformity upper and lower extremities. Good ROM, no contractures. Normal muscle tone.  Skin: no rashes, lesions, ulcers. No induration Neurologic: CN 2-12 grossly intact. Sensation intact, DTR normal. Strength 5/5 in all 4.  Psychiatric: Normal judgment and insight. Alert and oriented x 3. Normal mood.    Labs on Admission: I have personally reviewed following labs and imaging studies  CBC: Recent Labs  Lab 08/09/23 2340 08/14/23 0630  WBC 12.2* 11.6*  NEUTROABS  --  9.9*  HGB 11.4* 11.8*  HCT 35.1* 34.4*  MCV 89.5 89.1  PLT 226 273   Basic Metabolic Panel: Recent Labs  Lab 08/09/23 2340 08/14/23 0630  NA 138 137  K 4.0 4.1  CL 97* 98  CO2 28 28  GLUCOSE 125* 124*  BUN 46* 32*  CREATININE 1.30* 1.04*  CALCIUM 8.9 8.8*   GFR: Estimated Creatinine Clearance: 44.7 mL/min (A) (by C-G formula based on SCr of 1.04 mg/dL (H)). Liver Function Tests: Recent Labs  Lab 08/09/23 2340 08/14/23 0630  AST 20 25  ALT 27 31  ALKPHOS 73 73  BILITOT 0.7 0.7  PROT 6.5 6.4*  ALBUMIN 3.4* 3.3*   Recent Labs  Lab 08/14/23 0630  LIPASE 46   No results for input(s): "AMMONIA" in the last 168 hours. Coagulation Profile: No results for input(s): "INR", "PROTIME" in the last 168 hours. Cardiac Enzymes: No results for input(s): "CKTOTAL",  "CKMB", "CKMBINDEX", "TROPONINI" in the last 168 hours. BNP (last 3 results) No results for input(s): "PROBNP" in the last 8760 hours. HbA1C: No results for input(s): "HGBA1C" in the last 72 hours. CBG: No results for input(s): "GLUCAP" in the last 168  hours. Lipid Profile: No results for input(s): "CHOL", "HDL", "LDLCALC", "TRIG", "CHOLHDL", "LDLDIRECT" in the last 72 hours. Thyroid Function Tests: No results for input(s): "TSH", "T4TOTAL", "FREET4", "T3FREE", "THYROIDAB" in the last 72 hours. Anemia Panel: No results for input(s): "VITAMINB12", "FOLATE", "FERRITIN", "TIBC", "IRON", "RETICCTPCT" in the last 72 hours. Urine analysis:    Component Value Date/Time   COLORURINE STRAW (A) 01/01/2023 1637   APPEARANCEUR CLEAR (A) 01/01/2023 1637   APPEARANCEUR CANCELED 05/28/2022 1546   LABSPEC 1.007 01/01/2023 1637   LABSPEC 1.035 08/02/2014 2331   PHURINE 5.0 01/01/2023 1637   GLUCOSEU >=500 (A) 01/01/2023 1637   GLUCOSEU Negative 08/02/2014 2331   HGBUR NEGATIVE 01/01/2023 1637   BILIRUBINUR NEGATIVE 01/01/2023 1637   BILIRUBINUR CANCELED 05/28/2022 1546   BILIRUBINUR Negative 08/02/2014 2331   KETONESUR NEGATIVE 01/01/2023 1637   PROTEINUR NEGATIVE 01/01/2023 1637   UROBILINOGEN 0.2 03/15/2020 1416   UROBILINOGEN 1.0 08/20/2014 2125   NITRITE NEGATIVE 01/01/2023 1637   LEUKOCYTESUR NEGATIVE 01/01/2023 1637   LEUKOCYTESUR Negative 08/02/2014 2331    Radiological Exams on Admission: CT Angio Chest PE W and/or Wo Contrast Result Date: 08/14/2023 CLINICAL DATA:  83 year old female with history of acute on chronic respiratory failure and elevated D-dimer. Evaluate for potential pulmonary embolism. History of lung cancer. * Tracking Code: BO * EXAM: CT ANGIOGRAPHY CHEST WITH CONTRAST TECHNIQUE: Multidetector CT imaging of the chest was performed using the standard protocol during bolus administration of intravenous contrast. Multiplanar CT image reconstructions and MIPs were obtained  to evaluate the vascular anatomy. RADIATION DOSE REDUCTION: This exam was performed according to the departmental dose-optimization program which includes automated exposure control, adjustment of the mA and/or kV according to patient size and/or use of iterative reconstruction technique. CONTRAST:  75mL OMNIPAQUE IOHEXOL 350 MG/ML SOLN COMPARISON:  Chest CTA 07/15/2023. FINDINGS: Cardiovascular: No filling defects are noted within the pulmonary arterial tree to suggest pulmonary embolism. Heart size is mildly enlarged with concentric left ventricular hypertrophy. There is no significant pericardial fluid, thickening or pericardial calcification. There is aortic atherosclerosis, as well as atherosclerosis of the great vessels of the mediastinum and the coronary arteries, including calcified atherosclerotic plaque in the left main, left anterior descending, left circumflex and right coronary arteries. Mild calcifications of the aortic valve. Moderate calcifications of the mitral annulus. Dilatation of the pulmonic trunk (3.7 cm in diameter), concerning for pulmonary arterial hypertension. Mediastinum/Nodes: Extensive right hilar and mediastinal lymphadenopathy appears progressive compared to the prior study. Specific examples include a 1.7 cm short axis right hilar lymph node (axial image 53 of series 4), 1.6 cm short axis low right paratracheal lymph node (axial image 45 of series 4), 2.1 cm right paratracheal lymph node (axial image 39 of series 4), and a 1.3 cm superior mediastinal lymph node to the right of the proximal esophagus (axial image 18 of series 4), all of which appears slightly larger than the prior examination. Esophagus is unremarkable in appearance. No axillary lymphadenopathy. Lungs/Pleura: Patchy areas of ground-glass attenuation and septal thickening are again noted in the right lung, most severe in the right upper lobe, most compatible with evolving areas of postradiation pneumonitis and  developing fibrosis. Previously noted hypermetabolic right upper lobe pulmonary nodule is not clearly defined on today's examination, suggesting a positive response to therapy. There is a new nodular density in the periphery of the right upper lobe (axial image 50 of series 5) measuring 7 x 5 mm (mean diameter of 6 mm) which warrants attention on follow-up studies. Additional  patchy areas of ground-glass attenuation and peribronchovascular nodularity are noted elsewhere in the lungs bilaterally, most evident in the lower lobes of the lungs, likely of infectious or inflammatory etiology. No pleural effusions. Upper Abdomen: Aortic atherosclerosis. Musculoskeletal: There are no aggressive appearing lytic or blastic lesions noted in the visualized portions of the skeleton. Review of the MIP images confirms the above findings. IMPRESSION: 1. No evidence of pulmonary embolism. 2. Evolving posttreatment changes of radiation therapy in the right lung with regression of the original right upper lobe pulmonary nodule. However, today's study demonstrates evidence suggesting progressive nodal metastatic disease in the right hilar and mediastinal nodal stations, as above. 3. Findings in the lung bases concerning for active infection/inflammation. Clinical correlation for signs and symptoms of aspiration pneumonia is recommended. 4. Dilatation of the pulmonic trunk (3.7 cm in diameter), concerning for pulmonary arterial hypertension. 5. Mild cardiomegaly with concentric left ventricular hypertrophy. 6. Aortic atherosclerosis, in addition to left main and three-vessel coronary artery disease. 7. There are calcifications of the aortic valve and mitral annulus. Echocardiographic correlation for evaluation of potential valvular dysfunction may be warranted if clinically indicated. Aortic Atherosclerosis (ICD10-I70.0). Electronically Signed   By: Trudie Reed M.D.   On: 08/14/2023 08:18   DG Chest Portable 1 View Result Date:  08/14/2023 CLINICAL DATA:  82 year old female with cough, shortness of breath, right side chest pain. History of recently treated lung cancer. EXAM: PORTABLE CHEST 1 VIEW COMPARISON:  Portable chest 07/24/2023 and earlier. Chest CTA 07/15/2023. FINDINGS: Portable AP upright view at 0636 hours. Cardiomegaly, coarse right upper lung interstitial opacity and indistinct bilateral lung base opacity has not significantly changed from earlier this month. No superimposed pneumothorax, pulmonary edema. Small pleural effusions seen last month probably persist. Stable cardiac size and mediastinal contours. Visualized tracheal air column is within normal limits. Stable visualized osseous structures. Paucity of bowel gas. IMPRESSION: 1. Stable post treatment appearance of the chest. Unchanged right upper lung reticulonodular opacity which might be post radiation sequelae. Probable ongoing small pleural effusions. 2. No new cardiopulmonary abnormality. Electronically Signed   By: Odessa Fleming M.D.   On: 08/14/2023 06:46    EKG: Ordered  Assessment/Plan Principal Problem:   PNA (pneumonia) Active Problems:   Chest pain   CAP (community acquired pneumonia)   Aspiration pneumonia (HCC)  (please populate well all problems here in Problem List. (For example, if patient is on BP meds at home and you resume or decide to hold them, it is a problem that needs to be her. Same for CAD, COPD, HLD and so on)  Acute on chronic hypoxic respiratory failure -Secondary to RLL pneumonia, as well as mild COPD exacerbation -Change antibiotics to Unasyn and doxycycline to cover both aspiration pneumonia and atypical pneumonia. -Check atypical study including Legionella and mycoplasma -Breathing treatment  Acute COPD exacerbation -Patient had a recent COPD exacerbation was started on long tapering Decadron with end date on January 16.  This time, patient received IV Solu-Medrol x 1, plan to continue p.o. Decadron and treat pneumonia as  above -On ICS, LABA, every 6 hours DuoNebs and as needed albuterol -Incentive spirometry and flutter valve  Sepsis -Without acute endorgan damage.  Sepsis evidenced by new onset of hypoxemia, tachycardia, elevated lactic acid, source of infection is aspiration pneumonia.  Management as above  Chest pain -Probably secondary to right-sided lung cancer with metastatic lesions as well as new onset no pneumonia.  Symptomatic management.  PE rule out. -Troponin negative x 2, EKG pending to  rule out ACS  Question of dysphagia -Appears to be esophageal stage -Consult speech therapist -Soft diet for today  RUL non-small cell, lung CA -Today's CT chest showed signs of progression of RUL nodules as well as enlarged right hilar nodules, discussed the result with the patient and recommend she follow-up with oncology next week.  PAF -In sinus rhythm -Continue flecainide and Pradaxa  Recent rectal bleeding -Patient came to ED 4 days ago for acute rectal bleeding, but stopped same night, and no more such episodes since, clinically suspect diverticulosis bleeding -Denied any rectal bleeding this time and H&H is stable. Recommend outpatient GI follow up.  HTN -Continue amlodipine, plan to resume Lasix tomorrow  DVT prophylaxis: Pradaxa Code Status: Full code Family Communication: None at bedside Disposition Plan: Expect less than 2 midnight hospital stay Consults called: Daughter at bedside Admission status: Telemetry observation   Emeline General MD [facility] Pager [phone]  08/14/2023, 10:13 AM

## 2023-08-14 NOTE — Consult Note (Signed)
Pharmacy Antibiotic Note  [name] Douglas is a 82 y.o. female admitted on 08/14/2023 with shortness of breath. PMH significant for history of Lung CA with radiation treatments completed and COPD. Patient reports wearing home O2 at 1-2L [place] but is presently requiring 3L at baseline. Pharmacy has been consulted for Unasyn dosing for concerns of aspiration pneumonia.   Today, 08/14/2023: Day 1 of Unasyn WBC 11.6  Afebrile  Currently on baseline oxygen requirement of 3 liters  SCr 1.04 today with estimated CrCl 44.7 mL/min  Appears to be slightly above baseline  Received one time doses of azithromycin 500 mg IV and ceftriaxone 1 gm while in ED No new acute findings seen on CXR   Plan: Start Unasyn 3 gm IV Q6H based on current renal function  Pharmacy will continue to monitor and dose adjust appropriately   Temp (24hrs), Avg:98.6 F (37 C), Min:98.6 F (37 C), Max:98.6 F (37 C)  Recent Labs  Lab 08/09/23 2340 08/14/23 0630  WBC 12.2* 11.6*  CREATININE 1.30* 1.04*  LATICACIDVEN  --  1.7    Estimated Creatinine Clearance: 44.7 mL/min (A) (by C-G formula based on SCr of 1.04 mg/dL (H)).    Allergies  Allergen Reactions   Trazodone And Nefazodone Other (See Comments)    Agitation   Heparin Nausea And Vomiting   Antimicrobials this admission: 12/25 azithromycin x 1  12/25 ceftriaxone x 1  12/25 Unasyn >>   Dose adjustments this admission:  Microbiology results: 12/25 BCx: sent 12/25 Resp panel: negative   Thank you for allowing pharmacy to be a part of this patient's care.  Littie Deeds, PharmD Pharmacy Resident  08/14/2023 10:15 AM

## 2023-08-14 NOTE — ED Provider Notes (Signed)
[facility] Provider Note    Event Date/Time   First MD Initiated Contact with Patient 08/14/23 [phone]     (approximate)   History   Shortness of Breath (Pt came from home with history of Lung CA with radiation treatments completed and COPD. Pt came to ED with nonproductive spontaneous cough and SOB. Pt reports wearing home O2 at 1-2L [place] but is requiring more oxygen at present with current need being 3L oxygen [place]. )   HPI  [name] Douglas is a 82 y.o. female with history of lung cancer, COPD, hypertension, diabetes, hyperlipidemia, paroxysmal atrial fibrillation, hypothyroidism CVA on Pradaxa who presents to the emergency department with complaints of shortness of breath, cough and right-sided chest pain.  Symptoms started abruptly tonight.  No fever.  No lower extremity swelling or discomfort.  No vomiting or diarrhea.  Does wear oxygen chronically at home but her requirement has increased from 1 L up to 3 L.   History provided by patient, EMS.    Past Medical History:  Diagnosis Date   Anxiety    Arthritis    "shoulders" (08/20/2014)   Asthma    Chronic lower back pain    Complication of anesthesia    "agitated & restless after knee replacement in 2008"   COPD (chronic obstructive pulmonary disease) (HCC)    DDD (degenerative disc disease), lumbar    Depression    Diabetes mellitus without complication (HCC)    Diverticulosis    Family history of adverse reaction to anesthesia    GERD (gastroesophageal reflux disease)    High cholesterol    Hypertension    Hypothyroidism    Memory loss    mild   On home oxygen therapy    "2L at night" (08/20/2014)   PAF (paroxysmal atrial fibrillation) (HCC)    Peripheral vascular disease (HCC)    Pre-diabetes    Shortness of breath dyspnea    with exertion   Stroke (HCC)    mild, no deficits    Past Surgical History:  Procedure Laterality Date   CARDIAC CATHETERIZATION  ~ 2007   EXCISIONAL  HEMORRHOIDECTOMY  1960's   EYE SURGERY Bilateral    Cataract extraction with IOL   KNEE ARTHROSCOPY Left X 2 <2008   KNEE ARTHROSCOPY WITH SUBCHONDROPLASTY Right 04/19/2016   Procedure: KNEE ARTHROSCOPY WITH SUBCHONDROPLASTY, PARTIAL MENISCECTOMY;  Surgeon: Kennedy Bucker, MD;  Location: [facility] ORS;  Service: Orthopedics;  Laterality: Right;   LOOP RECORDER INSERTION N/A 01/02/2018   Procedure: LOOP RECORDER INSERTION;  Surgeon: Duke Salvia, MD;  Location: [facility] INVASIVE CV LAB;  Service: Cardiovascular;  Laterality: N/A;   LUMBAR LAMINECTOMY/DECOMPRESSION MICRODISCECTOMY N/A 02/06/2021   Procedure: Lumbar Three-Four, Lumbar Four-Five Laminectomy and Foraminotomy;  Surgeon: Barnett Abu, MD;  Location: [facility] OR;  Service: Neurosurgery;  Laterality: N/A;   SHOULDER OPEN ROTATOR CUFF REPAIR Right 2001   TONSILLECTOMY  ~ 1950   TOTAL KNEE ARTHROPLASTY Left 2008   TOTAL KNEE ARTHROPLASTY Right 07/03/2016   Procedure: TOTAL KNEE ARTHROPLASTY;  Surgeon: Kennedy Bucker, MD;  Location: [facility] ORS;  Service: Orthopedics;  Laterality: Right;   TUBAL LIGATION  1973   VAGINAL HYSTERECTOMY  1974    MEDICATIONS:  Prior to Admission medications   Medication Sig Start Date End Date Taking? Authorizing Provider  albuterol (VENTOLIN HFA) 108 (90 Base) MCG/ACT inhaler Inhale 2 puffs into the lungs every 4 (four) hours as needed.    [provider]  amLODipine (NORVASC) 10 MG tablet  Take 1 tablet (10 mg total) by mouth daily. Increased from 5 mg. 07/27/23   Darlin Priestly, MD  atorvastatin (LIPITOR) 80 MG tablet TAKE 1 TABLET BY MOUTH DAILY AT 6PM 02/13/23   McDonough, Lauren K, PA-C  budesonide (PULMICORT) 0.25 MG/2ML nebulizer solution Take 2 mLs (0.25 mg total) by nebulization daily. 07/15/23 07/14/24  Emeline General, MD  dabigatran (PRADAXA) 150 MG CAPS capsule Take 1 capsule (150 mg total) by mouth every 12 (twelve) hours. 01/02/23 01/02/24  Gillis Santa, MD  diphenhydramine-acetaminophen (TYLENOL PM) 25-500 MG  TABS tablet Take 1 tablet by mouth at bedtime as needed.    [provider]  donepezil (ARICEPT) 10 MG tablet TAKE ONE TABLET BY MOUTH AT BEDTIME 02/13/23   McDonough, Salomon Fick, PA-C  flecainide (TAMBOCOR) 50 MG tablet TAKE 1 TABLET BY MOUTH TWICE A DAY 06/20/23   Duke Salvia, MD  Fluticasone Furoate (ARNUITY ELLIPTA) 50 MCG/ACT AEPB Inhale 1 puff into the lungs 2 (two) times daily. 07/15/23 07/14/24  Emeline General, MD  furosemide (LASIX) 20 MG tablet TAKE 1 TABLET BY MOUTH EVERY OTHER DAY 02/28/23   Duke Salvia, MD  ipratropium-albuterol (DUONEB) 0.5-2.5 (3) MG/3ML SOLN Take 3 mLs by nebulization every 4 (four) hours as needed. 07/27/23   Darlin Priestly, MD  iron polysaccharides (NIFEREX) 150 MG capsule Take 150 mg by mouth daily.    [provider]  levothyroxine (SYNTHROID) 88 MCG tablet TAKE 1 TABLET BY MOUTH DAILY 06/10/23   Lyndon Code, MD  loratadine (ALLERGY RELIEF) 10 MG tablet Take 10 mg by mouth daily.    [provider]  mometasone-formoterol (DULERA) 100-5 MCG/ACT AERO Inhale 2 puffs into the lungs 2 (two) times daily. 07/15/23   Emeline General, MD  mometasone-formoterol (DULERA) 100-5 MCG/ACT AERO Inhale 2 puffs into the lungs daily. 07/15/23   Emeline General, MD  triamcinolone ointment (KENALOG) 0.1 % Apply 1 Application topically 2 (two) times daily.    [provider]  venlafaxine XR (EFFEXOR-XR) 75 MG 24 hr capsule TAKE 2 CAPSULES BY MOUTH DAILY Patient taking differently: 150 mg daily with breakfast. TAKE 2 CAPSULES BY MOUTH DAILY. 02/13/23   McDonough, Salomon Fick, PA-C  vitamin B-12 (CYANOCOBALAMIN) 500 MCG tablet Take 500 mcg by mouth daily.    [provider]  vitamin C (ASCORBIC ACID) 250 MG tablet Take 500 mg by mouth daily.    [provider]  Vitamin D, Ergocalciferol, (DRISDOL) 1.25 MG (50000 UNIT) CAPS capsule Take 1 capsule by mouth weekly 11/08/22   Carlean Jews, PA-C    Physical Exam   Triage Vital Signs: ED  Triage Vitals  Encounter Vitals Group     BP 08/14/23 0558 (!) 143/53     Systolic BP Percentile --      Diastolic BP Percentile --      Pulse Rate 08/14/23 0558 (!) 106     Resp 08/14/23 0558 (!) 29     Temp 08/14/23 0558 98.6 F (37 C)     Temp Source 08/14/23 0558 Oral     SpO2 08/14/23 0558 98 %     Weight --      Height --      Head Circumference --      Peak Flow --      Pain Score 08/14/23 0641 6     Pain Loc --      Pain Education --      Exclude from Growth Chart --  Most recent vital signs: Vitals:   08/14/23 0558 08/14/23 0640  BP: (!) 143/53   Pulse: (!) 106   Resp: (!) 29   Temp: 98.6 F (37 C)   SpO2: 98% 98%    CONSTITUTIONAL: Alert, responds appropriately to questions.  Elderly, afebrile HEAD: Normocephalic, atraumatic EYES: Conjunctivae clear, pupils appear equal, sclera nonicteric ENT: normal nose; moist mucous membranes NECK: Supple, normal ROM CARD: Regular and tachycardic; S1 and S2 appreciated, sequela of previous radiation noted to right upper quadrant and right chest wall.  No other rash or lesions present.  Nontender over the ribs. RESP: Patient is tachypneic.  Speaking short sentences.  Bibasilar crackles.  Scattered expiratory wheezing.  Satting 98% on 3 L nasal cannula.  No distress. ABD/GI: Non-distended; soft, non-tender, no rebound, no guarding, no peritoneal signs, no significant tenderness in the right upper quadrant BACK: The back appears normal EXT: Normal ROM in all joints; no deformity noted, no edema, no calf tenderness or calf swelling SKIN: Normal color for age and race; warm; no rash on exposed skin NEURO: Moves all extremities equally, normal speech PSYCH: The patient's mood and manner are appropriate.   ED Results / Procedures / Treatments   LABS: (all labs ordered are listed, but only abnormal results are displayed) Labs Reviewed  CBC WITH DIFFERENTIAL/PLATELET - Abnormal; Notable for the following components:       Result Value   WBC 11.6 (*)    RBC 3.86 (*)    Hemoglobin 11.8 (*)    HCT 34.4 (*)    Neutro Abs 9.9 (*)    Lymphs Abs 0.5 (*)    Abs Immature Granulocytes 0.50 (*)    All other components within normal limits  D-DIMER, QUANTITATIVE - Abnormal; Notable for the following components:   D-Dimer, Quant 1.63 (*)    All other components within normal limits  RESP PANEL BY RT-PCR (RSV, FLU A&B, COVID)  [id number]  CULTURE, BLOOD (ROUTINE X 2)  CULTURE, BLOOD (ROUTINE X 2)  COMPREHENSIVE METABOLIC PANEL  LIPASE, BLOOD  BRAIN NATRIURETIC PEPTIDE  LACTIC ACID, PLASMA  LACTIC ACID, PLASMA  TROPONIN I (HIGH SENSITIVITY)     EKG:    Date: 08/14/2023 6:11 AM  Rate: 114  Rhythm: Patient appears to have sinus tachycardia with PACs versus brief episodes of A-fib with RVR  QRS Axis: normal  Intervals: normal  ST/T Wave abnormalities: normal  Conduction Disutrbances: none  Narrative Interpretation: Sinus tachycardia with PACs    RADIOLOGY: My personal review and interpretation of imaging: No pneumonia.  No pneumothorax.  I have personally reviewed all radiology reports.   DG Chest Portable 1 View Result Date: 08/14/2023 CLINICAL DATA:  82 year old female with cough, shortness of breath, right side chest pain. History of recently treated lung cancer. EXAM: PORTABLE CHEST 1 VIEW COMPARISON:  Portable chest 07/24/2023 and earlier. Chest CTA 07/15/2023. FINDINGS: Portable AP upright view at 0636 hours. Cardiomegaly, coarse right upper lung interstitial opacity and indistinct bilateral lung base opacity has not significantly changed from earlier this month. No superimposed pneumothorax, pulmonary edema. Small pleural effusions seen last month probably persist. Stable cardiac size and mediastinal contours. Visualized tracheal air column is within normal limits. Stable visualized osseous structures. Paucity of bowel gas. IMPRESSION: 1. Stable post treatment appearance of the chest. Unchanged right  upper lung reticulonodular opacity which might be post radiation sequelae. Probable ongoing small pleural effusions. 2. No new cardiopulmonary abnormality. Electronically Signed   By: Odessa Fleming M.D.   On: 08/14/2023  06:46     PROCEDURES:  Critical Care performed: Yes, see critical care procedure note(s)   CRITICAL CARE Performed by: Rochele Raring   Total critical care time: 40 minutes  Critical care time was exclusive of separately billable procedures and treating other patients.  Critical care was necessary to treat or prevent imminent or life-threatening deterioration.  Critical care was time spent personally by me on the following activities: development of treatment plan with patient and/or surrogate as well as nursing, discussions with consultants, evaluation of patient's response to treatment, examination of patient, obtaining history from patient or surrogate, ordering and performing treatments and interventions, ordering and review of laboratory studies, ordering and review of radiographic studies, pulse oximetry and re-evaluation of patient's condition.   Marland Kitchen1-3 Lead EKG Interpretation  Performed by: Eirik Schueler, Layla Maw, DO Authorized by: Ermalee Mealy, Layla Maw, DO     Interpretation: abnormal     ECG rate:  114   ECG rate assessment: tachycardic     Rhythm comment:  Atrial fibrillation versus sinus tachycardia with PACs   Ectopy: none     Conduction: normal       IMPRESSION / MDM / ASSESSMENT AND PLAN / ED COURSE  I reviewed the triage vital signs and the nursing notes.    Patient here with shortness of breath, right-sided chest pain, cough.  The patient is on the cardiac monitor to evaluate for evidence of arrhythmia and/or significant heart rate changes.   DIFFERENTIAL DIAGNOSIS (includes but not limited to):   Viral URI, pneumonia, PE, COPD exacerbation, CHF, pneumothorax, worsening malignancy, pleural effusion, less likely ACS   Patient's presentation is most consistent  with acute presentation with potential threat to life or bodily function.   PLAN: Will obtain cardiac labs, COVID and flu swab, D-dimer.  Given tachycardia, tachypnea, will send blood cultures and lactic although these vital sign abnormalities may be secondary to respiratory failure rather than sepsis.  Will hold antibiotics at this time.  She is afebrile.  Will give breathing treatments, Solu-Medrol for symptomatic relief as well as fentanyl for pain control.  She has had increased work of breathing which is why EMS increased her from her 1 L nasal cannula baseline to 3 L.  Unclear if there was any hypoxia.   MEDICATIONS GIVEN IN ED: Medications  ipratropium-albuterol (DUONEB) 0.5-2.5 (3) MG/3ML nebulizer solution 3 mL (3 mLs Nebulization Given 08/14/23 0653)  fentaNYL (SUBLIMAZE) injection 50 mcg (50 mcg Intravenous Given 08/14/23 0653)  ondansetron (ZOFRAN) injection 4 mg (4 mg Intravenous Given 08/14/23 0653)  methylPREDNISolone sodium succinate (SOLU-MEDROL) 125 mg/2 mL injection 125 mg (125 mg Intravenous Given 08/14/23 6213)     ED COURSE: Patient's labs show leukocytosis of 11,000.  Stable hemoglobin.  Additional blood work pending.  Chest x-ray reviewed and interpreted by myself and radiologist and shows stable appearance.  Signed out to oncoming ED physician.  Anticipate admission.   CONSULTS: Anticipate admission secondary to acute respiratory failure with increased work of breathing, COPD exacerbation.   OUTSIDE RECORDS REVIEWED: Reviewed last admission on 07/15/2023 to 07/27/2023.       FINAL CLINICAL IMPRESSION(S) / ED DIAGNOSES   Final diagnoses:  Right-sided chest pain  COPD with acute exacerbation (HCC)     Rx / DC Orders   ED Discharge Orders     None        Note:  This document was prepared using Dragon voice recognition software and may include unintentional dictation errors.   Evelisse Szalkowski, Layla Maw, DO 08/14/23  0707  

## 2023-08-15 ENCOUNTER — Telehealth: Payer: Self-pay | Admitting: *Deleted

## 2023-08-15 DIAGNOSIS — Z515 Encounter for palliative care: Secondary | ICD-10-CM | POA: Diagnosis not present

## 2023-08-15 DIAGNOSIS — E1151 Type 2 diabetes mellitus with diabetic peripheral angiopathy without gangrene: Secondary | ICD-10-CM | POA: Diagnosis present

## 2023-08-15 DIAGNOSIS — D849 Immunodeficiency, unspecified: Secondary | ICD-10-CM | POA: Diagnosis present

## 2023-08-15 DIAGNOSIS — I2510 Atherosclerotic heart disease of native coronary artery without angina pectoris: Secondary | ICD-10-CM | POA: Diagnosis present

## 2023-08-15 DIAGNOSIS — Z1152 Encounter for screening for COVID-19: Secondary | ICD-10-CM | POA: Diagnosis not present

## 2023-08-15 DIAGNOSIS — I471 Supraventricular tachycardia, unspecified: Secondary | ICD-10-CM | POA: Diagnosis not present

## 2023-08-15 DIAGNOSIS — J441 Chronic obstructive pulmonary disease with (acute) exacerbation: Secondary | ICD-10-CM | POA: Diagnosis present

## 2023-08-15 DIAGNOSIS — K219 Gastro-esophageal reflux disease without esophagitis: Secondary | ICD-10-CM | POA: Diagnosis present

## 2023-08-15 DIAGNOSIS — C3411 Malignant neoplasm of upper lobe, right bronchus or lung: Secondary | ICD-10-CM | POA: Diagnosis present

## 2023-08-15 DIAGNOSIS — Z66 Do not resuscitate: Secondary | ICD-10-CM | POA: Diagnosis present

## 2023-08-15 DIAGNOSIS — J690 Pneumonitis due to inhalation of food and vomit: Secondary | ICD-10-CM | POA: Diagnosis present

## 2023-08-15 DIAGNOSIS — R0602 Shortness of breath: Secondary | ICD-10-CM | POA: Diagnosis not present

## 2023-08-15 DIAGNOSIS — J9621 Acute and chronic respiratory failure with hypoxia: Secondary | ICD-10-CM | POA: Diagnosis present

## 2023-08-15 DIAGNOSIS — E7800 Pure hypercholesterolemia, unspecified: Secondary | ICD-10-CM | POA: Diagnosis present

## 2023-08-15 DIAGNOSIS — Z79890 Hormone replacement therapy (postmenopausal): Secondary | ICD-10-CM | POA: Diagnosis not present

## 2023-08-15 DIAGNOSIS — Z8673 Personal history of transient ischemic attack (TIA), and cerebral infarction without residual deficits: Secondary | ICD-10-CM | POA: Diagnosis not present

## 2023-08-15 DIAGNOSIS — E872 Acidosis, unspecified: Secondary | ICD-10-CM | POA: Diagnosis present

## 2023-08-15 DIAGNOSIS — I110 Hypertensive heart disease with heart failure: Secondary | ICD-10-CM | POA: Diagnosis present

## 2023-08-15 DIAGNOSIS — G894 Chronic pain syndrome: Secondary | ICD-10-CM | POA: Diagnosis present

## 2023-08-15 DIAGNOSIS — J189 Pneumonia, unspecified organism: Secondary | ICD-10-CM

## 2023-08-15 DIAGNOSIS — F17210 Nicotine dependence, cigarettes, uncomplicated: Secondary | ICD-10-CM | POA: Diagnosis present

## 2023-08-15 DIAGNOSIS — I480 Paroxysmal atrial fibrillation: Secondary | ICD-10-CM | POA: Diagnosis present

## 2023-08-15 DIAGNOSIS — E039 Hypothyroidism, unspecified: Secondary | ICD-10-CM | POA: Diagnosis present

## 2023-08-15 DIAGNOSIS — I4719 Other supraventricular tachycardia: Secondary | ICD-10-CM | POA: Diagnosis present

## 2023-08-15 DIAGNOSIS — A419 Sepsis, unspecified organism: Secondary | ICD-10-CM | POA: Diagnosis present

## 2023-08-15 DIAGNOSIS — R5381 Other malaise: Secondary | ICD-10-CM | POA: Diagnosis not present

## 2023-08-15 DIAGNOSIS — R0789 Other chest pain: Secondary | ICD-10-CM | POA: Diagnosis present

## 2023-08-15 DIAGNOSIS — I700 Atherosclerosis of aorta: Secondary | ICD-10-CM | POA: Diagnosis present

## 2023-08-15 DIAGNOSIS — E669 Obesity, unspecified: Secondary | ICD-10-CM | POA: Diagnosis present

## 2023-08-15 DIAGNOSIS — I5032 Chronic diastolic (congestive) heart failure: Secondary | ICD-10-CM | POA: Diagnosis present

## 2023-08-15 DIAGNOSIS — J440 Chronic obstructive pulmonary disease with acute lower respiratory infection: Secondary | ICD-10-CM | POA: Diagnosis not present

## 2023-08-15 LAB — CBC
HCT: 35 % — ABNORMAL LOW (ref 36.0–46.0)
Hemoglobin: 11.2 g/dL — ABNORMAL LOW (ref 12.0–15.0)
MCH: 29 pg (ref 26.0–34.0)
MCHC: 32 g/dL (ref 30.0–36.0)
MCV: 90.7 fL (ref 80.0–100.0)
Platelets: 203 10*3/uL (ref 150–400)
RBC: 3.86 MIL/uL — ABNORMAL LOW (ref 3.87–5.11)
RDW: 14.2 % (ref 11.5–15.5)
WBC: 12.6 10*3/uL — ABNORMAL HIGH (ref 4.0–10.5)
nRBC: 0 % (ref 0.0–0.2)

## 2023-08-15 MED ORDER — PANTOPRAZOLE SODIUM 40 MG PO TBEC
40.0000 mg | DELAYED_RELEASE_TABLET | Freq: Every day | ORAL | Status: DC
  Administered 2023-08-15 – 2023-08-21 (×7): 40 mg via ORAL
  Filled 2023-08-15 (×7): qty 1

## 2023-08-15 MED ORDER — MELATONIN 5 MG PO TABS
2.5000 mg | ORAL_TABLET | Freq: Every day | ORAL | Status: DC
  Administered 2023-08-15 – 2023-08-20 (×6): 2.5 mg via ORAL
  Filled 2023-08-15 (×6): qty 1

## 2023-08-15 NOTE — Plan of Care (Signed)
  Problem: Education: Goal: Knowledge of General Education information will improve Description: Including pain rating scale, medication(s)/side effects and non-pharmacologic comfort measures Outcome: Progressing   Problem: Health Behavior/Discharge Planning: Goal: Ability to manage health-related needs will improve Outcome: Progressing   Problem: Clinical Measurements: Goal: Will remain free from infection Outcome: Progressing Goal: Respiratory complications will improve Outcome: Progressing Goal: Cardiovascular complication will be avoided Outcome: Progressing   Problem: Nutrition: Goal: Adequate nutrition will be maintained Outcome: Progressing   Problem: Coping: Goal: Level of anxiety will decrease Outcome: Progressing   Problem: Elimination: Goal: Will not experience complications related to urinary retention Outcome: Progressing   Problem: Pain Management: Goal: General experience of comfort will improve Outcome: Progressing   Problem: Safety: Goal: Ability to remain free from injury will improve Outcome: Progressing   Problem: Skin Integrity: Goal: Risk for impaired skin integrity will decrease Outcome: Progressing

## 2023-08-15 NOTE — Progress Notes (Signed)
PROGRESS NOTE    [name] Douglas  [name] DOB: [birth year] DOA: 08/14/2023 PCP: Mick Sell, MD  Chief Complaint  Patient presents with   Shortness of Breath    Pt came from home with history of Lung CA with radiation treatments completed and COPD. Pt came to ED with nonproductive spontaneous cough and SOB. Pt reports wearing home O2 at 1-2L [place] but is requiring more oxygen at present with current need being 3L oxygen [place].     Hospital Course:  [name] Douglas is 82 y.o. female with recently diagnosed non-small cell carcinoma right upper lung, status post radiation therapy in 04/2023, paroxysmal A-fib on Pradaxa and flecainide, COPD, chronic hypoxic respiratory failure on 1 L continuously, insulin-dependent diabetes, hypertension, chronic heart failure with preserved EF, who presents with dry cough and right-sided chest pain. Patient endorses that she frequently feels that the food is stuck in her chest, but denies any coughing or choking after eating.  On arrival patient was afebrile mildly tachycardic to 106.  WBC 11.6, CTA negative for PE but right lung nodular changes in the right upper lung and findings consistent with right lower lobe aspiration pneumonia.  She was started on ceftriaxone azithromycin   Subjective: This morning patient is persistently dyspneic.  She becomes tachycardic with minimal exertion.  Reports that she feels better than she did on admission but is still not back to her baseline.  She gets winded while talking.  She is currently requiring 3 to 4 L O2.  She reports at baseline she needs 3 L. Daughter is at bedside.  Objective: Vitals:   08/14/23 1600 08/14/23 1649 08/14/23 1938 08/15/23 0437  BP: (!) 152/55 134/63 (!) 159/55 (!) 157/55  Pulse: 91 99 98 89  Resp: 17 16 20 18   Temp:  98.2 F (36.8 C) 97.7 F (36.5 C) 97.8 F (36.6 C)  TempSrc:  Oral Oral Oral  SpO2: 97% 96% 95% 96%    Intake/Output Summary (Last 24 hours) at 08/15/2023 0830 Last  data filed at 08/15/2023 0700 Gross per 24 hour  Intake 800 ml  Output --  Net 800 ml   There were no vitals filed for this visit.  Examination: General exam: Dyspneic, calm, nontoxic appearing Respiratory system: Tachypneic, increased work of breathing with exertion.  Becomes dyspneic while speaking Cardiovascular system: S1 & S2 heard, RRR.  Gastrointestinal system: Abdomen is nondistended, soft and nontender.  Neuro: Alert and oriented. No focal neurological deficits. Extremities: Symmetric, expected ROM Skin: No rashes, lesions Psychiatry: Demonstrates appropriate judgement and insight. Mood & affect appropriate for situation.   Assessment & Plan:  Principal Problem:   PNA (pneumonia) Active Problems:   Chest pain   CAP (community acquired pneumonia)   Aspiration pneumonia (HCC)    Acute on chronic hypoxic respiratory failure - Secondary to right lower lobe pneumonia and COPD exacerbation - Currently on Unasyn and doxycycline to cover for aspiration and atypical pneumonias - Legionella and mycoplasma ordered and pending -flu/RSV/COVID/respiratory panel negative - Breathing treatments -- Baseline oxygen requirement: 3 L at rest, 4L when mobile.  Previously established home O2  Acute COPD exacerbation - Patient had recent COPD exacerbation and was started on long tapering Decadron with end date 1/16.  Patient is now status post IV Solu-Medrol x 1. - Will continue p.o. Decadron and treat pneumonia as above - Continue with ICS, LABA every 6 hours.  DuoNebs as needed - Incentive spirometry and flutter valve  Sepsis - No evidence of acute endorgan  damage - Criteria: Hypoxemia, tachycardia, lactic acidosis, aspiration pneumonia source - Management as above  Chest pain, improved - Likely pleuritis secondary to right-sided lung cancer with metastatic lesions as well as new onset pneumonia. - CTA negative for PE - Troponin negative x 2  Globus Sensation - There was  concern for aspiration, speech therapy consulted.  No report of oropharyngeal phase dysphagia during their assessment.  No clinical signs or symptoms of aspiration. - Reportedly patient has baseline esophageal phase dismotility.  Has outpatient follow-up with GI for this. - Initiate daily PPI for GERD  Right upper lung non-small cell lung cancer - Chest CT 12/25 reveals progression of right upper lung nodules as well as enlarged right hilar nodules.  Patient was made aware of these results.  She is following with oncology next week  Paroxysmal A-fib - Currently sinus - Continue flecainide and Pradaxa  Recent rectal bleeding - Prior ED visit 5 days prior to arrival for acute rectal bleeding, stopped same night and no further episodes - Hemoglobin stable at this time - Follow-up outpatient GI  Hypertension - Continue amlodipine   DVT prophylaxis: Pradaxa   Code Status: Full Code Family Communication: Daughter at bedside for discussion Disposition:  Status is: Inpatient, work up ongoing    Antimicrobials:  Anti-infectives (From admission, onward)    Start     Dose/Rate Route Frequency Ordered Stop   08/15/23 1000  cefTRIAXone (ROCEPHIN) 2 g in sodium chloride 0.9 % 100 mL IVPB  Status:  Discontinued        2 g 200 mL/hr over 30 Minutes Intravenous Every 24 hours 08/14/23 1004 08/14/23 1005   08/15/23 1000  azithromycin (ZITHROMAX) tablet 500 mg  Status:  Discontinued        500 mg Oral Daily 08/14/23 1004 08/14/23 1004   08/15/23 1000  doxycycline (VIBRA-TABS) tablet 100 mg        100 mg Oral Every 12 hours 08/14/23 1004     08/14/23 1200  Ampicillin-Sulbactam (UNASYN) 3 g in sodium chloride 0.9 % 100 mL IVPB        3 g 200 mL/hr over 30 Minutes Intravenous Every 6 hours 08/14/23 1037     08/14/23 0845  cefTRIAXone (ROCEPHIN) 1 g in sodium chloride 0.9 % 100 mL IVPB        1 g 200 mL/hr over 30 Minutes Intravenous  Once 08/14/23 0843 08/14/23 0932   08/14/23 0845  azithromycin  (ZITHROMAX) 500 mg in sodium chloride 0.9 % 250 mL IVPB        500 mg 250 mL/hr over 60 Minutes Intravenous  Once 08/14/23 0843 08/14/23 1054       Data Reviewed: I have personally reviewed following labs and imaging studies CBC: Recent Labs  Lab 08/09/23 2340 08/14/23 0630 08/15/23 0452  WBC 12.2* 11.6* 12.6*  NEUTROABS  --  9.9*  --   HGB 11.4* 11.8* 11.2*  HCT 35.1* 34.4* 35.0*  MCV 89.5 89.1 90.7  PLT 226 273 203   Basic Metabolic Panel: Recent Labs  Lab 08/09/23 2340 08/14/23 0630  NA 138 137  K 4.0 4.1  CL 97* 98  CO2 28 28  GLUCOSE 125* 124*  BUN 46* 32*  CREATININE 1.30* 1.04*  CALCIUM 8.9 8.8*   GFR: Estimated Creatinine Clearance: 44.7 mL/min (A) (by C-G formula based on SCr of 1.04 mg/dL (H)). Liver Function Tests: Recent Labs  Lab 08/09/23 2340 08/14/23 0630  AST 20 25  ALT 27 31  ALKPHOS  73 73  BILITOT 0.7 0.7  PROT 6.5 6.4*  ALBUMIN 3.4* 3.3*   CBG: No results for input(s): "GLUCAP" in the last 168 hours.  Recent Results (from the past 240 hours)  Blood culture (routine x 2)     Status: None (Preliminary result)   Collection Time: 08/14/23  6:30 AM   Specimen: BLOOD RIGHT ARM  Result Value Ref Range Status   Specimen Description BLOOD RIGHT ARM  Final   Special Requests   Final    BOTTLES DRAWN AEROBIC AND ANAEROBIC Blood Culture results may not be optimal due to an inadequate volume of blood received in culture bottles   Culture   Final    NO GROWTH 1 DAY Performed at [facility], [address] [place], [place] [address]    Report Status PENDING  Incomplete  Blood culture (routine x 2)     Status: None (Preliminary result)   Collection Time: 08/14/23  6:31 AM   Specimen: BLOOD  Result Value Ref Range Status   Specimen Description BLOOD LEFT AC  Final   Special Requests   Final    BOTTLES DRAWN AEROBIC AND ANAEROBIC Blood Culture results may not be optimal due to an inadequate volume of blood received in culture bottles    Culture   Final    NO GROWTH 1 DAY Performed at [facility], [address], [place], [place] [address]    Report Status PENDING  Incomplete  Resp panel by RT-PCR (RSV, Flu A&B, Covid) Anterior Nasal Swab     Status: None   Collection Time: 08/14/23  6:32 AM   Specimen: Anterior Nasal Swab  Result Value Ref Range Status   SARS Coronavirus 2 by RT PCR NEGATIVE NEGATIVE Final    Comment: (NOTE) SARS-CoV-2 target nucleic acids are NOT DETECTED.  The SARS-CoV-2 RNA is generally detectable in upper respiratory specimens during the acute phase of infection. The lowest concentration of SARS-CoV-2 viral copies this assay can detect is 138 copies/mL. A negative result does not preclude SARS-Cov-2 infection and should not be used as the sole basis for treatment or other patient management decisions. A negative result may occur with  improper specimen collection/handling, submission of specimen other than nasopharyngeal swab, presence of viral mutation(s) within the areas targeted by this assay, and inadequate number of viral copies(<138 copies/mL). A negative result must be combined with clinical observations, patient history, and epidemiological information. The expected result is Negative.  Fact Sheet for Patients:  BloggerCourse.com  Fact Sheet for Healthcare Providers:  SeriousBroker.it  This test is no t yet approved or cleared by the [place] FDA and  has been authorized for detection and/or diagnosis of SARS-CoV-2 by FDA under an Emergency Use Authorization (EUA). This EUA will remain  in effect (meaning this test can be used) for the duration of the COVID-19 declaration under Section 564(b)(1) of the Act, 21 U.S.C.section 360bbb-3(b)(1), unless the authorization is terminated  or revoked sooner.       Influenza A by PCR NEGATIVE NEGATIVE Final   Influenza B by PCR NEGATIVE NEGATIVE Final    Comment:  (NOTE) The Xpert Xpress SARS-CoV-2/FLU/RSV plus assay is intended as an aid in the diagnosis of influenza from Nasopharyngeal swab specimens and should not be used as a sole basis for treatment. Nasal washings and aspirates are unacceptable for Xpert Xpress SARS-CoV-2/FLU/RSV testing.  Fact Sheet for Patients: BloggerCourse.com  Fact Sheet for Healthcare Providers: SeriousBroker.it  This test is not yet approved or  cleared by the [place] and has been authorized for detection and/or diagnosis of SARS-CoV-2 by FDA under an Emergency Use Authorization (EUA). This EUA will remain in effect (meaning this test can be used) for the duration of the COVID-19 declaration under Section 564(b)(1) of the Act, 21 U.S.C. section 360bbb-3(b)(1), unless the authorization is terminated or revoked.     Resp Syncytial Virus by PCR NEGATIVE NEGATIVE Final    Comment: (NOTE) Fact Sheet for Patients: BloggerCourse.com  Fact Sheet for Healthcare Providers: SeriousBroker.it  This test is not yet approved or cleared by the [place] FDA and has been authorized for detection and/or diagnosis of SARS-CoV-2 by FDA under an Emergency Use Authorization (EUA). This EUA will remain in effect (meaning this test can be used) for the duration of the COVID-19 declaration under Section 564(b)(1) of the Act, 21 U.S.C. section 360bbb-3(b)(1), unless the authorization is terminated or revoked.  Performed at [facility], [address] [place], [place] [address]      Radiology Studies: CT Angio Chest PE W and/or Wo Contrast Result Date: 08/14/2023 CLINICAL DATA:  82 year old female with history of acute on chronic respiratory failure and elevated D-dimer. Evaluate for potential pulmonary embolism. History of lung cancer. * Tracking Code: BO * EXAM: CT ANGIOGRAPHY CHEST WITH CONTRAST  TECHNIQUE: Multidetector CT imaging of the chest was performed using the standard protocol during bolus administration of intravenous contrast. Multiplanar CT image reconstructions and MIPs were obtained to evaluate the vascular anatomy. RADIATION DOSE REDUCTION: This exam was performed according to the departmental dose-optimization program which includes automated exposure control, adjustment of the mA and/or kV according to patient size and/or use of iterative reconstruction technique. CONTRAST:  75mL OMNIPAQUE IOHEXOL 350 MG/ML SOLN COMPARISON:  Chest CTA 07/15/2023. FINDINGS: Cardiovascular: No filling defects are noted within the pulmonary arterial tree to suggest pulmonary embolism. Heart size is mildly enlarged with concentric left ventricular hypertrophy. There is no significant pericardial fluid, thickening or pericardial calcification. There is aortic atherosclerosis, as well as atherosclerosis of the great vessels of the mediastinum and the coronary arteries, including calcified atherosclerotic plaque in the left main, left anterior descending, left circumflex and right coronary arteries. Mild calcifications of the aortic valve. Moderate calcifications of the mitral annulus. Dilatation of the pulmonic trunk (3.7 cm in diameter), concerning for pulmonary arterial hypertension. Mediastinum/Nodes: Extensive right hilar and mediastinal lymphadenopathy appears progressive compared to the prior study. Specific examples include a 1.7 cm short axis right hilar lymph node (axial image 53 of series 4), 1.6 cm short axis low right paratracheal lymph node (axial image 45 of series 4), 2.1 cm right paratracheal lymph node (axial image 39 of series 4), and a 1.3 cm superior mediastinal lymph node to the right of the proximal esophagus (axial image 18 of series 4), all of which appears slightly larger than the prior examination. Esophagus is unremarkable in appearance. No axillary lymphadenopathy. Lungs/Pleura: Patchy  areas of ground-glass attenuation and septal thickening are again noted in the right lung, most severe in the right upper lobe, most compatible with evolving areas of postradiation pneumonitis and developing fibrosis. Previously noted hypermetabolic right upper lobe pulmonary nodule is not clearly defined on today's examination, suggesting a positive response to therapy. There is a new nodular density in the periphery of the right upper lobe (axial image 50 of series 5) measuring 7 x 5 mm (mean diameter of 6 mm) which warrants attention on follow-up studies. Additional patchy areas of ground-glass attenuation and peribronchovascular  nodularity are noted elsewhere in the lungs bilaterally, most evident in the lower lobes of the lungs, likely of infectious or inflammatory etiology. No pleural effusions. Upper Abdomen: Aortic atherosclerosis. Musculoskeletal: There are no aggressive appearing lytic or blastic lesions noted in the visualized portions of the skeleton. Review of the MIP images confirms the above findings. IMPRESSION: 1. No evidence of pulmonary embolism. 2. Evolving posttreatment changes of radiation therapy in the right lung with regression of the original right upper lobe pulmonary nodule. However, today's study demonstrates evidence suggesting progressive nodal metastatic disease in the right hilar and mediastinal nodal stations, as above. 3. Findings in the lung bases concerning for active infection/inflammation. Clinical correlation for signs and symptoms of aspiration pneumonia is recommended. 4. Dilatation of the pulmonic trunk (3.7 cm in diameter), concerning for pulmonary arterial hypertension. 5. Mild cardiomegaly with concentric left ventricular hypertrophy. 6. Aortic atherosclerosis, in addition to left main and three-vessel coronary artery disease. 7. There are calcifications of the aortic valve and mitral annulus. Echocardiographic correlation for evaluation of potential valvular dysfunction  may be warranted if clinically indicated. Aortic Atherosclerosis (ICD10-I70.0). Electronically Signed   By: Trudie Reed M.D.   On: 08/14/2023 08:18   DG Chest Portable 1 View Result Date: 08/14/2023 CLINICAL DATA:  82 year old female with cough, shortness of breath, right side chest pain. History of recently treated lung cancer. EXAM: PORTABLE CHEST 1 VIEW COMPARISON:  Portable chest 07/24/2023 and earlier. Chest CTA 07/15/2023. FINDINGS: Portable AP upright view at 0636 hours. Cardiomegaly, coarse right upper lung interstitial opacity and indistinct bilateral lung base opacity has not significantly changed from earlier this month. No superimposed pneumothorax, pulmonary edema. Small pleural effusions seen last month probably persist. Stable cardiac size and mediastinal contours. Visualized tracheal air column is within normal limits. Stable visualized osseous structures. Paucity of bowel gas. IMPRESSION: 1. Stable post treatment appearance of the chest. Unchanged right upper lung reticulonodular opacity which might be post radiation sequelae. Probable ongoing small pleural effusions. 2. No new cardiopulmonary abnormality. Electronically Signed   By: Odessa Fleming M.D.   On: 08/14/2023 06:46    Scheduled Meds:  amLODipine  10 mg Oral Daily   atorvastatin  80 mg Oral Daily   budesonide  0.25 mg Nebulization BID   dabigatran  150 mg Oral Q12H   dexamethasone  4 mg Oral Daily   donepezil  10 mg Oral QHS   doxycycline  100 mg Oral Q12H   flecainide  50 mg Oral BID   furosemide  20 mg Oral QODAY   ipratropium-albuterol  3 mL Nebulization Q6H   iron polysaccharides  150 mg Oral Daily   levothyroxine  88 mcg Oral Q0600   loratadine  10 mg Oral Daily   mometasone-formoterol  2 puff Inhalation BID   venlafaxine XR  150 mg Oral Q breakfast   Continuous Infusions:  ampicillin-sulbactam (UNASYN) IV Stopped (08/15/23 0654)     LOS: 0 days    Time spent:   Debarah Crape, DO [facility]  To contact the attending physician between 7A-7P please use Epic Chat. To contact the covering physician during after hours 7P-7A, please review Amion.   08/15/2023, 8:30 AM   *This document has been created with the assistance of dictation software. Please excuse typographical errors. *

## 2023-08-15 NOTE — Telephone Encounter (Signed)
Daughter called reporting that patient has been readmitted to hospital and is asking if Dr Rushie Chestnut will look at her CT scan done yesterday, and call to let her know if anything can be done and also asking if she should have another CT on 31 st as scheduled or change that to a PET scan. Please return her call  IMPRESSION: 1. No evidence of pulmonary embolism. 2. Evolving posttreatment changes of radiation therapy in the right lung with regression of the original right upper lobe pulmonary nodule. However, today's study demonstrates evidence suggesting progressive nodal metastatic disease in the right hilar and mediastinal nodal stations, as above. 3. Findings in the lung bases concerning for active infection/inflammation. Clinical correlation for signs and symptoms of aspiration pneumonia is recommended. 4. Dilatation of the pulmonic trunk (3.7 cm in diameter), concerning for pulmonary arterial hypertension. 5. Mild cardiomegaly with concentric left ventricular hypertrophy. 6. Aortic atherosclerosis, in addition to left main and three-vessel coronary artery disease. 7. There are calcifications of the aortic valve and mitral annulus. Echocardiographic correlation for evaluation of potential valvular dysfunction may be warranted if clinically indicated.   Aortic Atherosclerosis (ICD10-I70.0).     Electronically Signed   By: Trudie Reed M.D.   On: 08/14/2023 08:18

## 2023-08-15 NOTE — Evaluation (Signed)
Clinical/Bedside Swallow Evaluation Patient Details  Name: [name] Douglas MRN: [id number] Date of Birth: [birth year]  Today's Date: 08/15/2023 Time: SLP Start Time (ACUTE ONLY): 0855 SLP Stop Time (ACUTE ONLY): 0945 SLP Time Calculation (min) (ACUTE ONLY): 50 min  Past Medical History:  Past Medical History:  Diagnosis Date   Anxiety    Arthritis    "shoulders" (08/20/2014)   Asthma    Chronic lower back pain    Complication of anesthesia    "agitated & restless after knee replacement in 2008"   COPD (chronic obstructive pulmonary disease) (HCC)    DDD (degenerative disc disease), lumbar    Depression    Diabetes mellitus without complication (HCC)    Diverticulosis    Family history of adverse reaction to anesthesia    GERD (gastroesophageal reflux disease)    High cholesterol    Hypertension    Hypothyroidism    Memory loss    mild   On home oxygen therapy    "2L at night" (08/20/2014)   PAF (paroxysmal atrial fibrillation) (HCC)    Peripheral vascular disease (HCC)    Pre-diabetes    Shortness of breath dyspnea    with exertion   Stroke (HCC)    mild, no deficits   Past Surgical History:  Past Surgical History:  Procedure Laterality Date   CARDIAC CATHETERIZATION  ~ 2007   EXCISIONAL HEMORRHOIDECTOMY  1960's   EYE SURGERY Bilateral    Cataract extraction with IOL   KNEE ARTHROSCOPY Left X 2 <2008   KNEE ARTHROSCOPY WITH SUBCHONDROPLASTY Right 04/19/2016   Procedure: KNEE ARTHROSCOPY WITH SUBCHONDROPLASTY, PARTIAL MENISCECTOMY;  Surgeon: Kennedy Bucker, MD;  Location: [facility] ORS;  Service: Orthopedics;  Laterality: Right;   LOOP RECORDER INSERTION N/A 01/02/2018   Procedure: LOOP RECORDER INSERTION;  Surgeon: Duke Salvia, MD;  Location: [facility] INVASIVE CV LAB;  Service: Cardiovascular;  Laterality: N/A;   LUMBAR LAMINECTOMY/DECOMPRESSION MICRODISCECTOMY N/A 02/06/2021   Procedure: Lumbar Three-Four, Lumbar Four-Five Laminectomy and Foraminotomy;  Surgeon: Barnett Abu,  MD;  Location: [facility] OR;  Service: Neurosurgery;  Laterality: N/A;   SHOULDER OPEN ROTATOR CUFF REPAIR Right 2001   TONSILLECTOMY  ~ 1950   TOTAL KNEE ARTHROPLASTY Left 2008   TOTAL KNEE ARTHROPLASTY Right 07/03/2016   Procedure: TOTAL KNEE ARTHROPLASTY;  Surgeon: Kennedy Bucker, MD;  Location: [facility] ORS;  Service: Orthopedics;  Laterality: Right;   TUBAL LIGATION  1973   VAGINAL HYSTERECTOMY  1974   HPI:  Pt is a 82 y.o. female with medical history significant of recently diagnosed non-small cell carcinoma of RUL lung(cancer) status post Radiation therapy in September 2024, PAF on Pradaxa and flecainide, COPD Gold stage I, Fall, chronic hypoxic respiratory failure on 1 L continuously at home, GERD, obesity, Memory Loss, Anxiety, chronic back pain, IIDM, HTN, PVD, chronic HFpEF, presented with dry cough and right-sided chest pain. Symptoms started 3 days ago patient started develop sharp like chest pain and a new cough. Pt stated she had Phlegm at the time of the EMS arriving and felt like she was "coughing hard to get it up".  Patient denied any coughing or choking surrounding eating or drinking but does admit that frequently she feels "food stuck in the chest", but denied any history of aspiration pneumonia.  She described the chest pain as sharp like on the right side of the lower rib cage, worsening with cough and deep breath.  CT Angio of chest at admit: Evolving posttreatment changes of radiation therapy in the right  lung with regression of the original right upper lobe pulmonary  nodule. However, today's study demonstrates evidence suggesting  progressive nodal metastatic disease in the right hilar and  mediastinal nodal stations, as above.  3. Findings in the lung bases concerning for active  infection/inflammation. Clinical correlation for signs and symptoms  of aspiration pneumonia is recommended.  4. Dilatation of the pulmonic trunk (3.7 cm in diameter), concerning  for pulmonary arterial  hypertension.  5. Mild cardiomegaly with concentric left ventricular hypertrophy.   OF NOTE: pt endorsed s/s of Esophageal phase Dysmotility: she c/o BASELINE Discomfort in her chest -- "it gets stuck right here when i eat certain things", pointing to her mid-sternum area. She said her Daughter is scheduling an appt at a GI office in [place] for her to be seen for this (the Dtr works for the GI office).    Pt endorsed it feels like "it could come back up" -- she described Phlegm and coughing at the time she was brought in by EMS. Pt IS at risk for aspiration of REFLUX material w/ her description of her Esphageal phase Dysmotility.    Assessment / Plan / Recommendation  Clinical Impression   Pt seen for BSE this date. Pt awake, verbal and followed all instructions. SOB/WOB w/ exertion of sitting EOB but able to do so w/ support.  On 3L [place] O2 support; afebrile.  Pt presents w/ increased risk for aspiration/aspiration pneumonia in setting of in setting of Chronic declined Pulmonary status (RUL Ca w/ XRT txs); Esophageal phase dysmotility w/ report of "food stopping here" pointing to mid-sternum area. Pt also required frequent REST BREAKS during any/all tasks including po intake d/t SOB/WOB w/ any exertion.    No overt oropharyngeal phase swallowing issues noted during oral intake, though there is impact from her current Pulmonary status/decline on her breathing pattern challenging her Apnea timing. ANY such significant Pulmonary decline can impact Apnea timing and airway closure during the swallow which can impact pharyngeal swallowing, airway protection, and increase risk for aspiration to occur thus further Pulmonary impact/decline. ANY such Esophageal phase Dysmotility as pt reports can increase risk for aspiration of REFLUX material during retrograde flow.     During this evaluation, pt was given trials of ice chips, thins liquids and purees/softened solids w/ REST BREAKS b/t trials and careful  monitoring of pt's RR and effort. Pt encouraged to take REST BREAKS b/t trials to calm her breathing. No overt, clinical s/s of aspiration noted w/ trials given - pt's respiratory effort appeared to increase mildly w/ the effort from the exertion of the tasks but calmed again to her baseline w/ the REST BREAKS b/t trials. Swallowing of liquids was timely. Oral phase appeared grossly [facility] for trial consistencies given -- bolus management, mastication, and timely A-P transfer were noted w/ trials. Oral clearing achieved b/t trials. OM exam revealed no unilateral weakness.    Recommend a more Mech Soft diet consistency w/ moistened foods for ease of Esophageal clearing; thin liquids. Pills given Whole in Puree for ease of swallowing/Clearing the Esophagus. Aspiration precautions w/ frequent REST BREAKS during oral intake w/ close monitoring of RR and increased mouth breathing. Small sips/bites Slowly. GERD/REFLUX precautions. Tray setup and sitting support at meals.  No further skilled ST services indicated as pt appears at her Baseline. F/u w/ GI post D/C for management and tx.  Pt stated her Daughter has been scheduling an appointment w/ a GI in [facility] for her assessment and f/u there to  address c/o Dysmotility. Dietician and Palliative Care consults recommended for GOC. MD/NSG updated. SLP Visit Diagnosis: Dysphagia, unspecified (R13.10) (potential Esophageal phase Dysmotility per pt s/s' Pulmonary decline baseline)    Aspiration Risk  Mild aspiration risk;Risk for inadequate nutrition/hydration (reduced following general precautions)    Diet Recommendation   Thin;Age appropriate regular = a more Mech Soft diet consistency(chopped/cut meats) w/ moistened foods for ease of Esophageal clearing; thin liquids. Aspiration precautions w/ frequent REST BREAKS during oral intake w/ close monitoring of RR and increased mouth breathing. Small sips/bites Slowly. GERD/REFLUX precautions. Tray setup and sitting  support at meals.   Medication Administration: Whole meds with puree (for ease of clearing)    Other  Recommendations Recommended Consults: Consider GI evaluation;Consider esophageal assessment (Dietician f/u; Palliative Care f/u for GOC) Oral Care Recommendations: Oral care BID;Patient independent with oral care (setup)    Recommendations for follow up therapy are one component of a multi-disciplinary discharge planning process, led by the attending physician.  Recommendations may be updated based on patient status, additional functional criteria and insurance authorization.  Follow up Recommendations No SLP follow up      Assistance Recommended at Discharge  intermittent  Functional Status Assessment Patient has had a recent decline in their functional status and demonstrates the ability to make significant improvements in function in a reasonable and predictable amount of time.  Frequency and Duration  (n/a)   (n/a)       Prognosis Prognosis for improved oropharyngeal function: Fair Barriers to Reach Goals: Severity of deficits;Time post onset Barriers/Prognosis Comment: potential Esophageal phase Dysmotility per pt s/s' Pulmonary decline baseline w/ RUL Ca      Swallow Study   General Date of Onset: 08/14/23 HPI: Pt is a 82 y.o. female with medical history significant of recently diagnosed non-small cell carcinoma of RUL lung(cancer) status post Radiation therapy in September 2024, PAF on Pradaxa and flecainide, COPD Gold stage I, Fall, chronic hypoxic respiratory failure on 1 L continuously at home, IIDM, HTN, chronic HFpEF, presented with dry cough and right-sided chest pain. Symptoms started 3 days ago patient started develop sharp like chest pain and a new cough. Pt stated she had Phlegm at the time of the EMS arriving and felt like she was "coughing hard to get it up".  Patient denied any coughing or choking surrounding eating or drinking but does admit that frequently she feels  "food stuck in the chest", but denied any history of aspiration pneumonia.  She described the chest pain as sharp like on the right side of the lower rib cage, worsening with cough and deep breath.  CT Angio of chest at admit: Evolving posttreatment changes of radiation therapy in the right  lung with regression of the original right upper lobe pulmonary  nodule. However, today's study demonstrates evidence suggesting  progressive nodal metastatic disease in the right hilar and  mediastinal nodal stations, as above.  3. Findings in the lung bases concerning for active  infection/inflammation. Clinical correlation for signs and symptoms  of aspiration pneumonia is recommended.  4. Dilatation of the pulmonic trunk (3.7 cm in diameter), concerning  for pulmonary arterial hypertension.  5. Mild cardiomegaly with concentric left ventricular hypertrophy.   OF NOTE: pt endorsed s/s of Esophageal phase Dysmotility: she c/o BASELINE Discomfort in her chest -- "it gets stuck right here when i eat certain things", pointing to her mid-sternum area. She said her Daughter is scheduling an appt at a GI office in [place] for her to  be seen for this (the Dtr works for the GI office).    Pt endorsed it feels like "it could come back up" -- she described Phlegm and coughing at the time she was brought in by EMS. Pt IS at risk for aspiration of REFLUX material w/ her description of her Esphageal phase Dysmotility. Type of Study: Bedside Swallow Evaluation Previous Swallow Assessment: none Diet Prior to this Study: Regular;Thin liquids (Level 0) Temperature Spikes Noted: No (wbc 12.6) Respiratory Status: Nasal cannula (3L) History of Recent Intubation: No Behavior/Cognition: Alert;Cooperative;Pleasant mood Oral Cavity Assessment: Within Functional Limits Oral Care Completed by SLP: Yes Oral Cavity - Dentition: Adequate natural dentition Vision: Functional for self-feeding Self-Feeding Abilities: Able to feed self;Needs  set up Patient Positioning: Upright in bed (EOB) Baseline Vocal Quality: Normal (when breathign was calm) Volitional Cough: Strong Volitional Swallow: Able to elicit    Oral/Motor/Sensory Function Overall Oral Motor/Sensory Function: Within functional limits   Ice Chips Ice chips: Not tested   Thin Liquid Thin Liquid: Within functional limits Presentation: Cup;Self Fed;Straw (5 trials via each method) Other Comments: rest breaks b/t    Nectar Thick Nectar Thick Liquid: Not tested   Honey Thick Honey Thick Liquid: Not tested   Puree Puree: Within functional limits Presentation: Spoon;Self Fed (10 trials) Other Comments: rest breaks b/t   Solid     Solid: Within functional limits Presentation: Self Fed (9 trials) Other Comments: rest breaks b/t     Jerilynn Som, MS, CCC-SLP Speech Language Pathologist Rehab Services; [facility] -  [phone] (ascom) Jillian Pianka 08/15/2023,2:09 PM

## 2023-08-16 DIAGNOSIS — J189 Pneumonia, unspecified organism: Secondary | ICD-10-CM | POA: Diagnosis not present

## 2023-08-16 LAB — COMPREHENSIVE METABOLIC PANEL
ALT: 31 U/L (ref 0–44)
AST: 23 U/L (ref 15–41)
Albumin: 3.2 g/dL — ABNORMAL LOW (ref 3.5–5.0)
Alkaline Phosphatase: 78 U/L (ref 38–126)
Anion gap: 13 (ref 5–15)
BUN: 30 mg/dL — ABNORMAL HIGH (ref 8–23)
CO2: 28 mmol/L (ref 22–32)
Calcium: 9.2 mg/dL (ref 8.9–10.3)
Chloride: 97 mmol/L — ABNORMAL LOW (ref 98–111)
Creatinine, Ser: 0.9 mg/dL (ref 0.44–1.00)
GFR, Estimated: >60 mL/min (ref >60)
Glucose, Bld: 182 mg/dL — ABNORMAL HIGH (ref 70–99)
Potassium: 4.2 mmol/L (ref 3.5–5.1)
Sodium: 138 mmol/L (ref 135–145)
Total Bilirubin: 0.7 mg/dL (ref <1.2)
Total Protein: 6 g/dL — ABNORMAL LOW (ref 6.5–8.1)

## 2023-08-16 LAB — CBC WITH DIFFERENTIAL/PLATELET
Abs Immature Granulocytes: 0.34 10*3/uL — ABNORMAL HIGH (ref 0.00–0.07)
Basophils Absolute: 0 10*3/uL (ref 0.0–0.1)
Basophils Relative: 0 %
Eosinophils Absolute: 0 10*3/uL (ref 0.0–0.5)
Eosinophils Relative: 0 %
HCT: 33.7 % — ABNORMAL LOW (ref 36.0–46.0)
Hemoglobin: 10.9 g/dL — ABNORMAL LOW (ref 12.0–15.0)
Immature Granulocytes: 3 %
Lymphocytes Relative: 4 %
Lymphs Abs: 0.4 10*3/uL — ABNORMAL LOW (ref 0.7–4.0)
MCH: 28.8 pg (ref 26.0–34.0)
MCHC: 32.3 g/dL (ref 30.0–36.0)
MCV: 89.2 fL (ref 80.0–100.0)
Monocytes Absolute: 0.5 10*3/uL (ref 0.1–1.0)
Monocytes Relative: 4 %
Neutro Abs: 10.3 10*3/uL — ABNORMAL HIGH (ref 1.7–7.7)
Neutrophils Relative %: 89 %
Platelets: 190 10*3/uL (ref 150–400)
RBC: 3.78 MIL/uL — ABNORMAL LOW (ref 3.87–5.11)
RDW: 14 % (ref 11.5–15.5)
WBC: 11.6 10*3/uL — ABNORMAL HIGH (ref 4.0–10.5)
nRBC: 0 % (ref 0.0–0.2)

## 2023-08-16 LAB — MAGNESIUM: Magnesium: 2 mg/dL (ref 1.7–2.4)

## 2023-08-16 LAB — MYCOPLASMA PNEUMONIAE ANTIBODY, IGM: Mycoplasma pneumo IgM: <770 U/mL (ref 0–769)

## 2023-08-16 LAB — LEGIONELLA PNEUMOPHILA SEROGP 1 UR AG: L. pneumophila Serogp 1 Ur Ag: NEGATIVE

## 2023-08-16 LAB — PHOSPHORUS: Phosphorus: 3.4 mg/dL (ref 2.5–4.6)

## 2023-08-16 MED ORDER — IPRATROPIUM-ALBUTEROL 0.5-2.5 (3) MG/3ML IN SOLN
3.0000 mL | Freq: Two times a day (BID) | RESPIRATORY_TRACT | Status: DC
  Administered 2023-08-17: 3 mL via RESPIRATORY_TRACT
  Filled 2023-08-16: qty 3

## 2023-08-16 MED ORDER — VALACYCLOVIR HCL 500 MG PO TABS
1000.0000 mg | ORAL_TABLET | Freq: Three times a day (TID) | ORAL | Status: DC
  Administered 2023-08-16 – 2023-08-21 (×15): 1000 mg via ORAL
  Filled 2023-08-16 (×18): qty 2

## 2023-08-16 NOTE — Progress Notes (Signed)
PROGRESS NOTE    [name] Douglas  ZOX:[id number] DOB: July 13, 1941 DOA: 08/14/2023 PCP: Mick Sell, MD  Chief Complaint  Patient presents with   Shortness of Breath    Pt came from home with history of Lung CA with radiation treatments completed and COPD. Pt came to ED with nonproductive spontaneous cough and SOB. Pt reports wearing home O2 at 1-2L [place] but is requiring more oxygen at present with current need being 3L oxygen .     Hospital Course:  [name] Douglas is 82 y.o. female with recently diagnosed non-small cell carcinoma right upper lung, status post radiation therapy in 04/2023, paroxysmal A-fib on Pradaxa and flecainide, COPD, chronic hypoxic respiratory failure on 1 L continuously, insulin-dependent diabetes, hypertension, chronic heart failure with preserved EF, who presents with dry cough and right-sided chest pain. Patient endorses that she frequently feels that the food is stuck in her chest, but denies any coughing or choking after eating.  On arrival patient was afebrile mildly tachycardic to 106.  WBC 11.6, CTA negative for PE but right lung nodular changes in the right upper lung and findings consistent with right lower lobe aspiration pneumonia.  She was started on ceftriaxone azithromycin   Subjective: New rash over right flank overnight.  Patient reports it is painful and itchy. She does report that the rash is over the same distribution as the right chest pain that brought her in during this admission. She is improving from a respiratory standpoint though still dyspneic on exertion.  Objective: Vitals:   08/15/23 1541 08/15/23 2017 08/15/23 2052 08/16/23 0353  BP: (!) 143/61 (!) 155/62  (!) 145/65  Pulse: 74 96  91  Resp: 20 16  20   Temp: 97.6 F (36.4 C) (!) 97.4 F (36.3 C)  (!) 96.8 F (36 C)  TempSrc: Oral Oral  Axillary  SpO2: 95% 92% 92% (!) 88%    Intake/Output Summary (Last 24 hours) at 08/16/2023 0831 Last data filed at 08/15/2023 1300 Gross  per 24 hour  Intake 220 ml  Output --  Net 220 ml   There were no vitals filed for this visit.  Examination: General exam: Dyspneic, calm, nontoxic appearing Respiratory system:  dyspneic while speaking, rhonchorous bilaterally, no work of breathing at rest Cardiovascular system: S1 & S2 heard, RRR.  Gastrointestinal system: Abdomen is nondistended, soft and nontender.  Neuro: Alert and oriented. No focal neurological deficits. Extremities: Symmetric, expected ROM Skin: Excoriated rash over right flank, starting at spine midline and radiating under right breast.  Irritated seborrheic keratosis over areas of excoriation, no obvious vesicles Psychiatry: Demonstrates appropriate judgement and insight. Mood & affect appropriate for situation.   Assessment & Plan:  Principal Problem:   PNA (pneumonia) Active Problems:   Chest pain   CAP (community acquired pneumonia)   Aspiration pneumonia (HCC)   Pneumonia    Acute on chronic hypoxic respiratory failure - Secondary to right lower lobe pneumonia and COPD exacerbation - Currently on Unasyn and doxycycline to cover for aspiration and atypical pneumonias - Legionella and mycoplasma ordered and pending -flu/RSV/COVID/respiratory panel negative - Breathing treatments -- Baseline oxygen requirement: 3 L at rest, 4L when mobile.  Previously established home O2  Acute COPD exacerbation - Patient had recent COPD exacerbation and was started on long tapering Decadron with end date 1/16.  Patient is now status post IV Solu-Medrol x 1. - Will continue p.o. Decadron and treat pneumonia as above - Continue with LABA every 6 hours.  DuoNebs as  needed - Incentive spirometry and flutter valve  Shingles - New dermatomal right-sided flank rash.  No obvious vesicular lesions, however given unilaterality and dermatomal distribution, combined with patient age and immunocompromised state, shingles is likely.  Have initiated on valacyclovir. -  Continue valacyclovir for at least 7 days, consider a longer course up to 14days given active lung cancer.  Sepsis - No evidence of acute endorgan damage - Criteria: Hypoxemia, tachycardia, lactic acidosis, aspiration pneumonia source - Management as above  Chest pain, improved - Multifactorial.  In some part may be related to possible shingles which is just now presenting with rash.  Also Likely component of pleuritis secondary to right-sided lung cancer with metastatic lesions as well as new onset pneumonia. - CTA negative for PE - Troponin negative x 2  Globus Sensation - There was concern for aspiration, speech therapy consulted.  No report of oropharyngeal phase dysphagia during their assessment.  No clinical signs or symptoms of aspiration. - Reportedly patient has baseline esophageal phase dismotility.  Has outpatient follow-up with GI for this. - Initiate daily PPI for GERD  Right upper lung non-small cell lung cancer - Chest CT 12/25 reveals progression of right upper lung nodules as well as enlarged right hilar nodules.  Patient was made aware of these results.  She is following with oncology next week  Paroxysmal A-fib - Currently sinus - Continue flecainide and Pradaxa  Recent rectal bleeding - Prior ED visit 5 days prior to arrival for acute rectal bleeding, stopped same night and no further episodes - Hemoglobin stable at this time - Follow-up outpatient GI  Hypertension - Continue amlodipine   DVT prophylaxis: Pradaxa   Code Status: Full Code Family Communication: Son at bedside for discussion Disposition:  Status is: Inpatient, still dyspneic. Improving. Hopeful for DC in AM    Antimicrobials:  Anti-infectives (From admission, onward)    Start     Dose/Rate Route Frequency Ordered Stop   08/16/23 1000  valACYclovir (VALTREX) tablet 1,000 mg        1,000 mg Oral 3 times daily 08/16/23 0537     08/15/23 1000  cefTRIAXone (ROCEPHIN) 2 g in sodium chloride 0.9  % 100 mL IVPB  Status:  Discontinued        2 g 200 mL/hr over 30 Minutes Intravenous Every 24 hours 08/14/23 1004 08/14/23 1005   08/15/23 1000  azithromycin (ZITHROMAX) tablet 500 mg  Status:  Discontinued        500 mg Oral Daily 08/14/23 1004 08/14/23 1004   08/15/23 1000  doxycycline (VIBRA-TABS) tablet 100 mg        100 mg Oral Every 12 hours 08/14/23 1004     08/14/23 1200  Ampicillin-Sulbactam (UNASYN) 3 g in sodium chloride 0.9 % 100 mL IVPB        3 g 200 mL/hr over 30 Minutes Intravenous Every 6 hours 08/14/23 1037     08/14/23 0845  cefTRIAXone (ROCEPHIN) 1 g in sodium chloride 0.9 % 100 mL IVPB        1 g 200 mL/hr over 30 Minutes Intravenous  Once 08/14/23 0843 08/14/23 0932   08/14/23 0845  azithromycin (ZITHROMAX) 500 mg in sodium chloride 0.9 % 250 mL IVPB        500 mg 250 mL/hr over 60 Minutes Intravenous  Once 08/14/23 0843 08/14/23 1054       Data Reviewed: I have personally reviewed following labs and imaging studies CBC: Recent Labs  Lab 08/09/23 2340 08/14/23  0630 08/15/23 0452 08/16/23 0434  WBC 12.2* 11.6* 12.6* 11.6*  NEUTROABS  --  9.9*  --  10.3*  HGB 11.4* 11.8* 11.2* 10.9*  HCT 35.1* 34.4* 35.0* 33.7*  MCV 89.5 89.1 90.7 89.2  PLT 226 273 203 190   Basic Metabolic Panel: Recent Labs  Lab 08/09/23 2340 08/14/23 0630 08/16/23 0434  NA 138 137 138  K 4.0 4.1 4.2  CL 97* 98 97*  CO2 28 28 28   GLUCOSE 125* 124* 182*  BUN 46* 32* 30*  CREATININE 1.30* 1.04* 0.90  CALCIUM 8.9 8.8* 9.2  MG  --   --  2.0  PHOS  --   --  3.4   GFR: Estimated Creatinine Clearance: 51.7 mL/min (by C-G formula based on SCr of 0.9 mg/dL). Liver Function Tests: Recent Labs  Lab 08/09/23 2340 08/14/23 0630 08/16/23 0434  AST 20 25 23   ALT 27 31 31   ALKPHOS 73 73 78  BILITOT 0.7 0.7 0.7  PROT 6.5 6.4* 6.0*  ALBUMIN 3.4* 3.3* 3.2*   CBG: No results for input(s): "GLUCAP" in the last 168 hours.  Recent Results (from the past 240 hours)  Blood culture  (routine x 2)     Status: None (Preliminary result)   Collection Time: 08/14/23  6:30 AM   Specimen: BLOOD RIGHT ARM  Result Value Ref Range Status   Specimen Description BLOOD RIGHT ARM  Final   Special Requests   Final    BOTTLES DRAWN AEROBIC AND ANAEROBIC Blood Culture results may not be optimal due to an inadequate volume of blood received in culture bottles   Culture   Final    NO GROWTH 2 DAYS Performed at [facility], [address], [place], [place] [address]    Report Status PENDING  Incomplete  Blood culture (routine x 2)     Status: None (Preliminary result)   Collection Time: 08/14/23  6:31 AM   Specimen: BLOOD  Result Value Ref Range Status   Specimen Description BLOOD LEFT AC  Final   Special Requests   Final    BOTTLES DRAWN AEROBIC AND ANAEROBIC Blood Culture results may not be optimal due to an inadequate volume of blood received in culture bottles   Culture   Final    NO GROWTH 2 DAYS Performed at [facility], [address], [place], [place] [address]    Report Status PENDING  Incomplete  Resp panel by RT-PCR (RSV, Flu A&B, Covid) Anterior Nasal Swab     Status: None   Collection Time: 08/14/23  6:32 AM   Specimen: Anterior Nasal Swab  Result Value Ref Range Status   SARS Coronavirus 2 by RT PCR NEGATIVE NEGATIVE Final    Comment: (NOTE) SARS-CoV-2 target nucleic acids are NOT DETECTED.  The SARS-CoV-2 RNA is generally detectable in upper respiratory specimens during the acute phase of infection. The lowest concentration of SARS-CoV-2 viral copies this assay can detect is 138 copies/mL. A negative result does not preclude SARS-Cov-2 infection and should not be used as the sole basis for treatment or other patient management decisions. A negative result may occur with  improper specimen collection/handling, submission of specimen other than nasopharyngeal swab, presence of viral mutation(s) within the areas targeted by this assay, and  inadequate number of viral copies(<138 copies/mL). A negative result must be combined with clinical observations, patient history, and epidemiological information. The expected result is Negative.  Fact Sheet for Patients:  BloggerCourse.com  Fact Sheet for Healthcare Providers:  SeriousBroker.it  This test is no t yet approved or cleared by the [place] and  has been authorized for detection and/or diagnosis of SARS-CoV-2 by FDA under an Emergency Use Authorization (EUA). This EUA will remain  in effect (meaning this test can be used) for the duration of the COVID-19 declaration under Section 564(b)(1) of the Act, 21 U.S.C.section 360bbb-3(b)(1), unless the authorization is terminated  or revoked sooner.       Influenza A by PCR NEGATIVE NEGATIVE Final   Influenza B by PCR NEGATIVE NEGATIVE Final    Comment: (NOTE) The Xpert Xpress SARS-CoV-2/FLU/RSV plus assay is intended as an aid in the diagnosis of influenza from Nasopharyngeal swab specimens and should not be used as a sole basis for treatment. Nasal washings and aspirates are unacceptable for Xpert Xpress SARS-CoV-2/FLU/RSV testing.  Fact Sheet for Patients: BloggerCourse.com  Fact Sheet for Healthcare Providers: SeriousBroker.it  This test is not yet approved or cleared by the [place] FDA and has been authorized for detection and/or diagnosis of SARS-CoV-2 by FDA under an Emergency Use Authorization (EUA). This EUA will remain in effect (meaning this test can be used) for the duration of the COVID-19 declaration under Section 564(b)(1) of the Act, 21 U.S.C. section 360bbb-3(b)(1), unless the authorization is terminated or revoked.     Resp Syncytial Virus by PCR NEGATIVE NEGATIVE Final    Comment: (NOTE) Fact Sheet for Patients: BloggerCourse.com  Fact Sheet for Healthcare  Providers: SeriousBroker.it  This test is not yet approved or cleared by the [place] FDA and has been authorized for detection and/or diagnosis of SARS-CoV-2 by FDA under an Emergency Use Authorization (EUA). This EUA will remain in effect (meaning this test can be used) for the duration of the COVID-19 declaration under Section 564(b)(1) of the Act, 21 U.S.C. section 360bbb-3(b)(1), unless the authorization is terminated or revoked.  Performed at [facility], [address] [place], [place] [address]      Radiology Studies: No results found.   Scheduled Meds:  amLODipine  10 mg Oral Daily   atorvastatin  80 mg Oral Daily   budesonide  0.25 mg Nebulization BID   dabigatran  150 mg Oral Q12H   dexamethasone  4 mg Oral Daily   donepezil  10 mg Oral QHS   doxycycline  100 mg Oral Q12H   flecainide  50 mg Oral BID   furosemide  20 mg Oral QODAY   ipratropium-albuterol  3 mL Nebulization Q6H   iron polysaccharides  150 mg Oral Daily   levothyroxine  88 mcg Oral Q0600   loratadine  10 mg Oral Daily   melatonin  2.5 mg Oral QHS   mometasone-formoterol  2 puff Inhalation BID   pantoprazole  40 mg Oral Daily   valACYclovir  1,000 mg Oral TID   venlafaxine XR  150 mg Oral Q breakfast   Continuous Infusions:  ampicillin-sulbactam (UNASYN) IV 3 g (08/16/23 0431)     LOS: 1 day    Time spent:   Debarah Crape, DO [facility]  To contact the attending physician between 7A-7P please use Epic Chat. To contact the covering physician during after hours 7P-7A, please review Amion.   08/16/2023, 8:31 AM   *This document has been created with the assistance of dictation software. Please excuse typographical errors. *

## 2023-08-16 NOTE — Plan of Care (Signed)

## 2023-08-16 NOTE — Progress Notes (Signed)
       CROSS COVER NOTE  NAME: [name] Douglas MRN: [id number] DOB : Jun 17, 1941    Concern as stated by nurse / staff   Message received from Chrissie Noa RN via secure chat hi, patient is complaining of a new rash on her right abdomen. she says someone needs to take a look at it and do something about it. thanks!     Pertinent findings on chart review:   Assessment and  Interventions   Assessment:    08/16/2023    3:53 AM 08/15/2023    8:17 PM 08/15/2023    3:41 PM  Vitals with BMI  Systolic 145 155 742  Diastolic 65 62 61  Pulse 91 96 74   Dermatomal; (just below right breast extend posteriorly in same line to spine but not past painful burning itching rash . Looks like may be vesicles forming (patient has been itching Patient endorses history of shingles in past but cannot how long ago or location Plan: Airborne  Valvyclovir 1000 TID ID consult in am by dayteam if needed       .blm

## 2023-08-16 NOTE — Consult Note (Signed)
Pharmacy Antibiotic Note  [name] Douglas is a 82 y.o. female admitted on 08/14/2023 with shortness of breath. PMH significant for history of Lung CA with radiation treatments completed and COPD. Patient reports wearing home O2 at 1-2L [place] but is presently requiring 3L at baseline. Pharmacy has been consulted for Unasyn dosing for concerns of aspiration pneumonia.   -doxycycline added 08/15/23 -possible shingles, valacyclovir added 12/27  Today, 08/16/2023: Day 3 of Unasyn WBC 11.6  Afebrile Scr 0.90 Received one time doses of azithromycin 500 mg IV and ceftriaxone 1 gm while in ED No new acute findings seen on CXR    Plan: continue Unasyn 3 gm IV Q6H based on current renal function   Pharmacy will continue to monitor and dose adjust appropriately   Temp (24hrs), Avg:97.4 F (36.3 C), Min:96.8 F (36 C), Max:97.7 F (36.5 C)  Recent Labs  Lab 08/09/23 2340 08/14/23 0630 08/14/23 0928 08/15/23 0452 08/16/23 0434  WBC 12.2* 11.6*  --  12.6* 11.6*  CREATININE 1.30* 1.04*  --   --  0.90  LATICACIDVEN  --  1.7 2.7*  --   --     Estimated Creatinine Clearance: 51.7 mL/min (by C-G formula based on SCr of 0.9 mg/dL).    Allergies  Allergen Reactions   Trazodone And Nefazodone Other (See Comments)    Agitation   Heparin Nausea And Vomiting   Antimicrobials this admission:  azith/CRO x 1 each 12/25 Unasyn 12/25>> Doxy 12/26>> Valacyclovir 12/27 >>  Dose adjustments this admission:  Microbiology results: 12/25 BCx: NGTD 12/25 Resp panel: negative   Thank you for allowing pharmacy to be a part of this patient's care.  Gardy Montanari A, PharmD 08/16/2023 2:23 PM

## 2023-08-17 DIAGNOSIS — I471 Supraventricular tachycardia, unspecified: Secondary | ICD-10-CM

## 2023-08-17 DIAGNOSIS — J690 Pneumonitis due to inhalation of food and vomit: Secondary | ICD-10-CM

## 2023-08-17 DIAGNOSIS — J441 Chronic obstructive pulmonary disease with (acute) exacerbation: Secondary | ICD-10-CM | POA: Diagnosis not present

## 2023-08-17 DIAGNOSIS — I4719 Other supraventricular tachycardia: Secondary | ICD-10-CM

## 2023-08-17 LAB — COMPREHENSIVE METABOLIC PANEL
ALT: 31 U/L (ref 0–44)
AST: 23 U/L (ref 15–41)
Albumin: 2.9 g/dL — ABNORMAL LOW (ref 3.5–5.0)
Alkaline Phosphatase: 77 U/L (ref 38–126)
Anion gap: 10 (ref 5–15)
BUN: 30 mg/dL — ABNORMAL HIGH (ref 8–23)
CO2: 28 mmol/L (ref 22–32)
Calcium: 8.8 mg/dL — ABNORMAL LOW (ref 8.9–10.3)
Chloride: 103 mmol/L (ref 98–111)
Creatinine, Ser: 0.88 mg/dL (ref 0.44–1.00)
GFR, Estimated: >60 mL/min (ref >60)
Glucose, Bld: 210 mg/dL — ABNORMAL HIGH (ref 70–99)
Potassium: 4 mmol/L (ref 3.5–5.1)
Sodium: 141 mmol/L (ref 135–145)
Total Bilirubin: 0.9 mg/dL (ref <1.2)
Total Protein: 5.8 g/dL — ABNORMAL LOW (ref 6.5–8.1)

## 2023-08-17 LAB — CBC WITH DIFFERENTIAL/PLATELET
Abs Immature Granulocytes: 0.22 10*3/uL — ABNORMAL HIGH (ref 0.00–0.07)
Basophils Absolute: 0 10*3/uL (ref 0.0–0.1)
Basophils Relative: 0 %
Eosinophils Absolute: 0.1 10*3/uL (ref 0.0–0.5)
Eosinophils Relative: 1 %
HCT: 31.3 % — ABNORMAL LOW (ref 36.0–46.0)
Hemoglobin: 10.2 g/dL — ABNORMAL LOW (ref 12.0–15.0)
Immature Granulocytes: 2 %
Lymphocytes Relative: 4 %
Lymphs Abs: 0.4 10*3/uL — ABNORMAL LOW (ref 0.7–4.0)
MCH: 28.6 pg (ref 26.0–34.0)
MCHC: 32.6 g/dL (ref 30.0–36.0)
MCV: 87.7 fL (ref 80.0–100.0)
Monocytes Absolute: 0.4 10*3/uL (ref 0.1–1.0)
Monocytes Relative: 4 %
Neutro Abs: 9 10*3/uL — ABNORMAL HIGH (ref 1.7–7.7)
Neutrophils Relative %: 89 %
Platelets: 183 10*3/uL (ref 150–400)
RBC: 3.57 MIL/uL — ABNORMAL LOW (ref 3.87–5.11)
RDW: 14.2 % (ref 11.5–15.5)
WBC: 10 10*3/uL (ref 4.0–10.5)
nRBC: 0 % (ref 0.0–0.2)

## 2023-08-17 LAB — MAGNESIUM: Magnesium: 2 mg/dL (ref 1.7–2.4)

## 2023-08-17 LAB — GLUCOSE, CAPILLARY: Glucose-Capillary: 228 mg/dL — ABNORMAL HIGH (ref 70–99)

## 2023-08-17 LAB — PHOSPHORUS: Phosphorus: 3.6 mg/dL (ref 2.5–4.6)

## 2023-08-17 MED ORDER — METOPROLOL TARTRATE 5 MG/5ML IV SOLN
2.5000 mg | Freq: Once | INTRAVENOUS | Status: AC
  Administered 2023-08-17: 2.5 mg via INTRAVENOUS

## 2023-08-17 MED ORDER — IPRATROPIUM-ALBUTEROL 0.5-2.5 (3) MG/3ML IN SOLN
3.0000 mL | Freq: Two times a day (BID) | RESPIRATORY_TRACT | Status: DC | PRN
  Filled 2023-08-17: qty 3

## 2023-08-17 MED ORDER — METOPROLOL SUCCINATE ER 25 MG PO TB24
25.0000 mg | ORAL_TABLET | Freq: Every evening | ORAL | Status: DC
  Administered 2023-08-17 – 2023-08-18 (×2): 25 mg via ORAL
  Filled 2023-08-17 (×3): qty 1

## 2023-08-17 NOTE — Progress Notes (Signed)
RN called at 0930 by charge RN stating telemetry reporting 170s heartrate sustained,  Oxygen in 80s, MD notified.  On arrival to room patient sitting on [facility] with tech, alert and oriented and able to speak/answer questions.  Patient assisted back to bed.  Patient denied chest pain but endorsed difficulty breathing with oxygenation at 82% on 4 liters oxygen via nasal cannula.  Rapid response called.  MD evaluated.  EKG completed.  Metoprolol 2.5mg  given.  Heartrate responsed at rate 98 and oxygen 90%on 4 liters oxygen.  Patient states breathing feeling better and able to speak full sentences. Patient resting in bed.  Daughter notified of event.

## 2023-08-17 NOTE — Progress Notes (Signed)
PROGRESS NOTE    [name] Douglas  ZOX:[id number] DOB: 1940-12-13 DOA: 08/14/2023 PCP: Mick Sell, MD  Chief Complaint  Patient presents with   Shortness of Breath    Pt came from home with history of Lung CA with radiation treatments completed and COPD. Pt came to ED with nonproductive spontaneous cough and SOB. Pt reports wearing home O2 at 1-2L [place] but is requiring more oxygen at present with current need being 3L oxygen .     Hospital Course:  [name] Douglas is 82 y.o. female with recently diagnosed non-small cell carcinoma right upper lung, status post radiation therapy in 04/2023, paroxysmal A-fib on Pradaxa and flecainide, COPD, chronic hypoxic respiratory failure on 1 L continuously, hypertension, chronic heart failure with preserved EF, who presents with dry cough and right-sided chest pain. Patient endorses that she frequently feels that the food is stuck in her chest, but denies any coughing or choking after eating.  On arrival patient was afebrile, mildly tachycardic to 106.  WBC 11.6, CTA negative for PE but noted right lung nodular changes in the right upper lung and findings consistent with right lower lobe aspiration pneumonia.  She was started on abx and admitted for further work up.  Subjective: Rapid response called this morning for tachycardia.  Reportedly patient heart rate was 170s, O2 sat in the 80s.  Upon my arrival heart rate was in the 140s.  Patient was responsive to questions, mildly tachypneic, but did not appear far from her baseline.  She denied any chest pain.  Prior to rapid response patient had exerted herself going to the bathroom.  She has also recently received a breathing treatment.  EKG was performed which revealed atrial fibrillation with RVR.  2.5 mg IV metoprolol was ordered.  Heart rate responded well.  Patient's O2 saturations returned to 89-91 percent on 4 L.  When I left the bedside patient was sitting up, talking, in no acute  distress.  Objective: Vitals:   08/16/23 1958 08/16/23 2120 08/17/23 0351 08/17/23 0843  BP: (!) 153/56  (!) 156/49 (!) 147/57  Pulse: 86  76   Resp:   16 18  Temp: 98 F (36.7 C)  97.8 F (36.6 C) 97.7 F (36.5 C)  TempSrc: Oral  Oral Oral  SpO2: 95% 95% (!) 89% 92%   No intake or output data in the 24 hours ending 08/17/23 1249  There were no vitals filed for this visit.  Examination: General exam: Dyspneic, calm, nontoxic appearing Respiratory system:  dyspneic while speaking, rhonchorous bilaterally, no work of breathing at rest Cardiovascular system: S1 & S2 heard, RRR, irregular rhythm.  Gastrointestinal system: Abdomen is nondistended, soft and nontender.  Neuro: Alert and oriented. No focal neurological deficits. Extremities: Symmetric, expected ROM Skin: Excoriated rash over right flank, starting at spine midline and radiating under right breast.  Irritated seborrheic keratosis over areas of excoriation, no obvious vesicles Psychiatry: Demonstrates appropriate judgement and insight. Mood & affect appropriate for situation.   Assessment & Plan:  Principal Problem:   PNA (pneumonia) Active Problems:   Chest pain   CAP (community acquired pneumonia)   Aspiration pneumonia (HCC)   Pneumonia      Paroxysmal A-fib, intermittent RVR - Provoked by exertion and albuterol, HR as high as 180s. Resolved with metop IVP this AM -- Consult cardiology as pt may require titration of current meds She does also have history of SVT responsive to adenosine - Continue flecainide and Pradaxa  Acute on chronic  hypoxic respiratory failure - Secondary to right lower lobe pneumonia and COPD exacerbation - Currently on Unasyn and doxycycline to cover for aspiration and atypical pneumonias - Legionella and mycoplasma neg -flu/RSV/COVID/respiratory panel negative - Breathing treatments RPN -- Baseline oxygen requirement: 3 L at rest, 4L when mobile.  Previously established home  O2  Acute COPD exacerbation - Patient had recent COPD exacerbation and was started on long tapering Decadron with end date 1/16.  Patient is now status post IV Solu-Medrol x 1. - Will continue p.o. Decadron and treat pneumonia as above - Continue with LABA every 6 hours.  DuoNebs as needed - Incentive spirometry and flutter valve  Shingles - New dermatomal right-sided flank rash.  No obvious vesicular lesions, however given unilaterality and dermatomal distribution, combined with patient age and immunocompromised state, shingles is likely.  Have initiated on valacyclovir. - Continue valacyclovir for at least 7 days, consider a longer course up to 14days given active lung cancer.  Sepsis - No evidence of acute endorgan damage - Criteria: Hypoxemia, tachycardia, lactic acidosis, aspiration pneumonia source - Management as above  Chest pain, resolved - Multifactorial.  In some part may be related to possible shingles which is just now presenting with rash.  Also Likely component of pleuritis secondary to right-sided lung cancer with metastatic lesions as well as new onset pneumonia. - CTA negative for PE - Troponin negative x 2  Globus Sensation - There was concern for aspiration, speech therapy consulted.  No report of oropharyngeal phase dysphagia during their assessment.  No clinical signs or symptoms of aspiration. - Reportedly patient has baseline esophageal phase dismotility.  Has outpatient follow-up with GI for this. - Initiate daily PPI for GERD  Right upper lung non-small cell lung cancer - Chest CT 12/25 reveals progression of right upper lung nodules as well as enlarged right hilar nodules.  Patient was made aware of these results.  She is following with oncology next week  Recent rectal bleeding - Prior ED visit 5 days prior to arrival for acute rectal bleeding, stopped same night and no further episodes - Hemoglobin stable at this time - Follow-up outpatient  GI  Hypertension - Continue amlodipine   DVT prophylaxis: Pradaxa   Code Status: Full Code Family Communication: Discussed with daughter, Carollee Herter on the phone Disposition:  Status is: Inpatient, RRT this AM, HR precluding DC at this time. On Home O2 requirement.    Antimicrobials:  Anti-infectives (From admission, onward)    Start     Dose/Rate Route Frequency Ordered Stop   08/16/23 1000  valACYclovir (VALTREX) tablet 1,000 mg        1,000 mg Oral 3 times daily 08/16/23 0537     08/15/23 1000  cefTRIAXone (ROCEPHIN) 2 g in sodium chloride 0.9 % 100 mL IVPB  Status:  Discontinued        2 g 200 mL/hr over 30 Minutes Intravenous Every 24 hours 08/14/23 1004 08/14/23 1005   08/15/23 1000  azithromycin (ZITHROMAX) tablet 500 mg  Status:  Discontinued        500 mg Oral Daily 08/14/23 1004 08/14/23 1004   08/15/23 1000  doxycycline (VIBRA-TABS) tablet 100 mg        100 mg Oral Every 12 hours 08/14/23 1004     08/14/23 1200  Ampicillin-Sulbactam (UNASYN) 3 g in sodium chloride 0.9 % 100 mL IVPB        3 g 200 mL/hr over 30 Minutes Intravenous Every 6 hours 08/14/23 1037  08/14/23 0845  cefTRIAXone (ROCEPHIN) 1 g in sodium chloride 0.9 % 100 mL IVPB        1 g 200 mL/hr over 30 Minutes Intravenous  Once 08/14/23 0843 08/14/23 0932   08/14/23 0845  azithromycin (ZITHROMAX) 500 mg in sodium chloride 0.9 % 250 mL IVPB        500 mg 250 mL/hr over 60 Minutes Intravenous  Once 08/14/23 0843 08/14/23 1054       Data Reviewed: I have personally reviewed following labs and imaging studies CBC: Recent Labs  Lab 08/14/23 0630 08/15/23 0452 08/16/23 0434 08/17/23 0710  WBC 11.6* 12.6* 11.6* 10.0  NEUTROABS 9.9*  --  10.3* 9.0*  HGB 11.8* 11.2* 10.9* 10.2*  HCT 34.4* 35.0* 33.7* 31.3*  MCV 89.1 90.7 89.2 87.7  PLT 273 203 190 183   Basic Metabolic Panel: Recent Labs  Lab 08/14/23 0630 08/16/23 0434 08/17/23 0710  NA 137 138 141  K 4.1 4.2 4.0  CL 98 97* 103  CO2 28 28  28   GLUCOSE 124* 182* 210*  BUN 32* 30* 30*  CREATININE 1.04* 0.90 0.88  CALCIUM 8.8* 9.2 8.8*  MG  --  2.0 2.0  PHOS  --  3.4 3.6   GFR: Estimated Creatinine Clearance: 52.8 mL/min (by C-G formula based on SCr of 0.88 mg/dL). Liver Function Tests: Recent Labs  Lab 08/14/23 0630 08/16/23 0434 08/17/23 0710  AST 25 23 23   ALT 31 31 31   ALKPHOS 73 78 77  BILITOT 0.7 0.7 0.9  PROT 6.4* 6.0* 5.8*  ALBUMIN 3.3* 3.2* 2.9*   CBG: Recent Labs  Lab 08/17/23 0941  GLUCAP 228*    Recent Results (from the past 240 hours)  Blood culture (routine x 2)     Status: None (Preliminary result)   Collection Time: 08/14/23  6:30 AM   Specimen: BLOOD RIGHT ARM  Result Value Ref Range Status   Specimen Description BLOOD RIGHT ARM  Final   Special Requests   Final    BOTTLES DRAWN AEROBIC AND ANAEROBIC Blood Culture results may not be optimal due to an inadequate volume of blood received in culture bottles   Culture   Final    NO GROWTH 3 DAYS Performed at [facility], [address], [place], [place] [address]    Report Status PENDING  Incomplete  Blood culture (routine x 2)     Status: None (Preliminary result)   Collection Time: 08/14/23  6:31 AM   Specimen: BLOOD  Result Value Ref Range Status   Specimen Description BLOOD LEFT AC  Final   Special Requests   Final    BOTTLES DRAWN AEROBIC AND ANAEROBIC Blood Culture results may not be optimal due to an inadequate volume of blood received in culture bottles   Culture   Final    NO GROWTH 3 DAYS Performed at [facility], [address], [place], [place] [address]    Report Status PENDING  Incomplete  Resp panel by RT-PCR (RSV, Flu A&B, Covid) Anterior Nasal Swab     Status: None   Collection Time: 08/14/23  6:32 AM   Specimen: Anterior Nasal Swab  Result Value Ref Range Status   SARS Coronavirus 2 by RT PCR NEGATIVE NEGATIVE Final    Comment: (NOTE) SARS-CoV-2 target nucleic acids are NOT DETECTED.  The  SARS-CoV-2 RNA is generally detectable in upper respiratory specimens during the acute phase of infection. The lowest concentration of SARS-CoV-2 viral copies this assay can detect  is 138 copies/mL. A negative result does not preclude SARS-Cov-2 infection and should not be used as the sole basis for treatment or other patient management decisions. A negative result may occur with  improper specimen collection/handling, submission of specimen other than nasopharyngeal swab, presence of viral mutation(s) within the areas targeted by this assay, and inadequate number of viral copies(<138 copies/mL). A negative result must be combined with clinical observations, patient history, and epidemiological information. The expected result is Negative.  Fact Sheet for Patients:  BloggerCourse.com  Fact Sheet for Healthcare Providers:  SeriousBroker.it  This test is no t yet approved or cleared by the [place] FDA and  has been authorized for detection and/or diagnosis of SARS-CoV-2 by FDA under an Emergency Use Authorization (EUA). This EUA will remain  in effect (meaning this test can be used) for the duration of the COVID-19 declaration under Section 564(b)(1) of the Act, 21 U.S.C.section 360bbb-3(b)(1), unless the authorization is terminated  or revoked sooner.       Influenza A by PCR NEGATIVE NEGATIVE Final   Influenza B by PCR NEGATIVE NEGATIVE Final    Comment: (NOTE) The Xpert Xpress SARS-CoV-2/FLU/RSV plus assay is intended as an aid in the diagnosis of influenza from Nasopharyngeal swab specimens and should not be used as a sole basis for treatment. Nasal washings and aspirates are unacceptable for Xpert Xpress SARS-CoV-2/FLU/RSV testing.  Fact Sheet for Patients: BloggerCourse.com  Fact Sheet for Healthcare Providers: SeriousBroker.it  This test is not yet approved or  cleared by the [place] FDA and has been authorized for detection and/or diagnosis of SARS-CoV-2 by FDA under an Emergency Use Authorization (EUA). This EUA will remain in effect (meaning this test can be used) for the duration of the COVID-19 declaration under Section 564(b)(1) of the Act, 21 U.S.C. section 360bbb-3(b)(1), unless the authorization is terminated or revoked.     Resp Syncytial Virus by PCR NEGATIVE NEGATIVE Final    Comment: (NOTE) Fact Sheet for Patients: BloggerCourse.com  Fact Sheet for Healthcare Providers: SeriousBroker.it  This test is not yet approved or cleared by the [place] FDA and has been authorized for detection and/or diagnosis of SARS-CoV-2 by FDA under an Emergency Use Authorization (EUA). This EUA will remain in effect (meaning this test can be used) for the duration of the COVID-19 declaration under Section 564(b)(1) of the Act, 21 U.S.C. section 360bbb-3(b)(1), unless the authorization is terminated or revoked.  Performed at [facility], [address] [place], [place] [address]      Radiology Studies: No results found.   Scheduled Meds:  amLODipine  10 mg Oral Daily   atorvastatin  80 mg Oral Daily   dabigatran  150 mg Oral Q12H   dexamethasone  4 mg Oral Daily   donepezil  10 mg Oral QHS   doxycycline  100 mg Oral Q12H   flecainide  50 mg Oral BID   furosemide  20 mg Oral QODAY   iron polysaccharides  150 mg Oral Daily   levothyroxine  88 mcg Oral Q0600   loratadine  10 mg Oral Daily   melatonin  2.5 mg Oral QHS   mometasone-formoterol  2 puff Inhalation BID   pantoprazole  40 mg Oral Daily   valACYclovir  1,000 mg Oral TID   venlafaxine XR  150 mg Oral Q breakfast   Continuous Infusions:  ampicillin-sulbactam (UNASYN) IV 3 g (08/17/23 1206)     LOS: 2 days    Time spent:   Debarah Crape, DO [facility]  To contact the attending  physician between 7A-7P please use Epic Chat. To contact the covering physician during after hours 7P-7A, please review Amion.   08/17/2023, 12:49 PM   *This document has been created with the assistance of dictation software. Please excuse typographical errors. *

## 2023-08-17 NOTE — Significant Event (Signed)
Rapid Response Event Note   Reason for Call : Elevated heart rate after returning from bathroom.    Initial Focused Assessment: Patient found to be laying in bed. Heart rate 160s. EKG in process, resulted AFIB RVR. MD called and to bedside.       Interventions: Stat EKG, 2.5mg  metoprolol IV.    Plan of Care: As above. RN to call for further assistance.     Event Summary: As above.   MD Notified: Dezii Call Time: 0947 Arrival Time: 1610 End [id number]  Rosemary Holms, RN

## 2023-08-17 NOTE — TOC Initial Note (Signed)
Transition of Care [facility]) - Initial/Assessment Note    Patient Details  Name: [name] Douglas MRN: [id number] Date of Birth: 1941/06/27  Transition of [facility]) CM/SW Contact:    Liliana Cline, LCSW Phone Number: 08/17/2023, 2:46 PM  Clinical Narrative:                 CSW spoke with daughter Carollee Herter) for readmission risk assessment. Patient is from home alone. PCP is Dr. Sampson Goon. Pharmacy is [facility] - if they are closed [facility]. Patient has a nebulizer and home o2 through [facility]. Patient also has a rollator. Patient is active with [facility] for PT and OT (confirmed with [facility] Representative Heather as well). Carollee Herter provides transportation to appointments.  Expected Discharge Plan: Home w Home Health Services Barriers to Discharge: Continued Medical Work up   Patient Goals and CMS Choice   CMS Medicare.gov Compare Post Acute Care list provided to:: Patient Represenative (must comment) Choice offered to / list presented to : Adult Children      Expected Discharge Plan and Services       Living arrangements for the past 2 months: Single Family Home                                      Prior Living Arrangements/Services Living arrangements for the past 2 months: Single Family Home Lives with:: Self Patient language and need for interpreter reviewed:: Yes Do you feel safe going back to the place where you live?: Yes      Need for Family Participation in Patient Care: Yes (Comment) Care giver support system in place?: Yes (comment) Current home services: DME, Home OT, Home PT Criminal Activity/Legal Involvement Pertinent to Current Situation/Hospitalization: No - Comment as needed  Activities of Daily Living   ADL Screening (condition at time of admission) Independently performs ADLs?: Yes (appropriate for developmental age) Is the patient deaf or have difficulty hearing?: No Does  the patient have difficulty seeing, even when wearing glasses/contacts?: No Does the patient have difficulty concentrating, remembering, or making decisions?: No  Permission Sought/Granted Permission sought to share information with : Facility Industrial/product designer granted to share information with : Yes, Verbal Permission Granted (by daughter Princella Pellegrini)     Permission granted to share info w AGENCY: [facility]        Emotional Assessment         Alcohol / Substance Use: Not Applicable Psych Involvement: No (comment)  Admission diagnosis:  COPD with acute exacerbation (HCC) [J44.1] PNA (pneumonia) [J18.9] Right-sided chest pain [R07.9] Aspiration pneumonia of both lower lobes, unspecified aspiration pneumonia type (HCC) [J69.0] Pneumonia [J18.9] Patient Active Problem List   Diagnosis Date Noted   Pneumonia 08/15/2023   Aspiration pneumonia (HCC) 08/14/2023   PNA (pneumonia) 08/14/2023   Hemoptysis 07/15/2023   CAP (community acquired pneumonia) 07/15/2023   Weakness of left upper extremity 01/01/2023   Generalized weakness 01/01/2023   History of loop recorder 03/02/2022   Palpitations    Elevated troponin    SVT (supraventricular tachycardia) (HCC)    PAF (paroxysmal atrial fibrillation) (HCC)    Chest pain 05/02/2021   Lumbar stenosis with neurogenic claudication 02/06/2021   Compression fracture of fifth lumbar vertebra (HCC) 12/20/2020   DDD (degenerative disc disease), thoracolumbar 11/22/2020   DDD (degenerative disc disease), thoracic 11/10/2020   TIA (transient ischemic attack)  10/12/2020   Left shoulder pain 10/12/2020   Chronic thoracic back pain (Left) 10/10/2020   T12 compression fracture, sequela 10/10/2020   Lumbar foraminal stenosis (Bilateral: L2-3, L3-4, L4-5) (Right: L5-S1) (Severe left L4-5) 09/08/2020   Grade 1 Lumbar Anterolisthesis (L3/4 and L4/5) 09/08/2020   Grade 1 Lumbar Retrolisthesis (L2/3) 09/08/2020   Protrusion of  lumbar intervertebral disc (Multilevel) 09/08/2020   Compression fracture of L1 lumbar vertebra, sequela 09/08/2020   Other intervertebral disc degeneration, lumbar region 09/08/2020   Lumbosacral facet syndrome (Multilevel) (Bilateral) 09/08/2020   Lumbar facet hypertrophy (Multilevel) (Bilateral) 09/08/2020   Ligamentum flavum hypertrophy 09/08/2020   Lumbar lateral recess stenosis (Right: L2-3) 09/08/2020   DDD (degenerative disc disease), lumbosacral 09/08/2020   Compression fracture of L5 vertebra (HCC) 09/08/2020   Osteoporosis with pathological fracture of lumbar vertebra (HCC) 09/08/2020   Vitamin B12 deficiency 09/08/2020   Chronic pain syndrome 09/07/2020   Pharmacologic therapy 09/07/2020   Disorder of skeletal system 09/07/2020   Problems influencing health status 09/07/2020   Uncomplicated opioid dependence (HCC) 09/07/2020   Chronic low back pain (2ry area of Pain) (Bilateral) (R>L) w/ sciatica (Right) 09/07/2020   Chronic lower extremity pain (1ry area of Pain) (Right) 09/07/2020   Chronic lumbar radiculopathy (Right) 09/07/2020   Lumbosacral radiculopathy at L2 (Right) 09/07/2020   Lumbar radiculitis (L2 dermatome) (Right) 09/07/2020   Unspecified inflammatory spondylopathy, lumbar region (HCC) 09/07/2020   Chronic anticoagulation (Eliquis) 09/07/2020   Abnormal MRI, lumbar spine (04/20/2020) 09/07/2020   Intractable low back pain 08/03/2020   Fall down stairs 08/03/2020   Hemorrhagic stroke (HCC) 08/03/2020   Dizziness and giddiness 07/26/2020   Other chronic pain 07/26/2020   Other sequelae of cerebral infarction 07/18/2020   CVA (cerebral vascular accident) (HCC) 07/14/2020   Blurred vision 07/14/2020   Lumbar central spinal stenosis (Moderate: L2-3  Severe: L3-4, L4-5) w/ neurogenic claudication 05/31/2020   Muscle spasm 04/10/2020   Urinary tract infection with hematuria 04/10/2020   Headache disorder 12/21/2019   History of stroke 12/21/2019   Dizziness  11/06/2019   Atopic dermatitis 10/28/2019   History of CVA (cerebrovascular accident) 10/07/2019   Intractable episodic cluster headache 09/20/2019   Other symptoms and signs involving the nervous system 09/20/2019   Anxiety disorder, unspecified 08/21/2019   Dependence on supplemental oxygen 08/21/2019   Presence of artificial knee joint, bilateral 08/21/2019   Nicotine dependence, cigarettes, uncomplicated 08/21/2019   Iron deficiency anemia 05/17/2019   Vitamin D deficiency 05/17/2019   Flu vaccine need 05/17/2019   Encounter for general adult medical examination with abnormal findings 04/05/2019   Nausea and vomiting 04/05/2019   Dysuria 04/05/2019   Need for vaccination against Streptococcus pneumoniae using pneumococcal conjugate vaccine 7 04/05/2019   Occlusion and stenosis of bilateral carotid arteries 11/24/2017   Subarachnoid hemorrhage (HCC) 12/09/2016   Solitary pulmonary nodule 08/24/2016   Primary localized osteoarthritis of right knee 07/03/2016   Ileus (HCC) 04/24/2016   Colitis due to Clostridium difficile    Recurrent Clostridium difficile diarrhea    Enteritis due to Clostridium difficile 08/22/2014   Diarrhea 08/20/2014   Essential hypertension 08/20/2014   Hyperlipidemia 08/20/2014   Hypothyroidism 08/20/2014   Tobacco abuse 08/20/2014   COPD (chronic obstructive pulmonary disease) (HCC) 08/20/2014   Acute diarrhea 07/27/2014   PCP:  Mick Sell, MD Pharmacy:   [facility] - [place], [place] - [address] [place] [address] Phone: [phone] Fax: [phone]  [id number] [place], [place] -  [address] [place] [address] Phone: [phone] Fax: [phone]  [id number] - [place], [place] - [address] AT [facility] [address] [facility] [address] [place] [address] Phone: [phone] Fax: [phone]     Social Drivers of  Health (SDOH) Social History: SDOH Screenings   Food Insecurity: No Food Insecurity (08/14/2023)  Housing: Low Risk  (08/14/2023)  Transportation Needs: No Transportation Needs (08/14/2023)  Utilities: Not At Risk (08/14/2023)  Alcohol Screen: Low Risk  (08/08/2020)  Depression (PHQ2-9): Low Risk  (04/10/2021)  Financial Resource Strain: Low Risk  (06/03/2023)   Received from [facility]  Tobacco Use: Medium Risk (08/14/2023)   SDOH Interventions:     Readmission Risk Interventions    07/17/2023   11:52 AM  Readmission Risk Prevention Plan  Transportation Screening Complete  PCP or Specialist Appt within 3-5 Days Complete  HRI or Home Care Consult Complete  Social Work Consult for Recovery Care Planning/Counseling Complete  Palliative Care Screening Not Applicable  Medication Review Oceanographer) Complete

## 2023-08-17 NOTE — Consult Note (Signed)
Cardiology Consultation   Patient ID: [name] Douglas MRN: [id number]; DOB: [birth year]  Admit date: 08/14/2023 Date of Consult: 08/17/2023  PCP:  Mick Sell, MD   [facility] Providers Cardiologist:  None        Patient Profile:   [name] Douglas is a 82 y.o. female with a hx of bronchogenic lung carcinoma status post XRT, COPD on 1-2 L, HFpEF, CVA, TIA, SVT, and PAF who is being seen 08/17/2023 for the evaluation of tachycardia at the request of Dr. Rennis Chris.  History of Present Illness:   [name] Douglas presented to the hospital 12/25 with shortness of breath and right-sided chest pain.  She reported feeling that food gets stuck in her chest.  Chest CTA was consistent with right lower lobe aspiration pneumonia.  She was started on antibiotics with plans to be discharged home today.  However rapid response was called due to tachycardia.  Heart rate went into the 170s.  This occurred when she was getting up and trying to go to the bathroom.  She had also recently received a breathing treatment.  She was given 2.5 mg of IV metoprolol and her heart rate came down to the 80s to 90s.  She has a history of cryptogenic stroke and had a loop recorder implanted 12/2017.  It was explanted 08/2022 but she had a recurrent TIA 12/2022.  She was started on anticoagulation with Pradaxa.  She was also treated for episodes of SVT with adenosine.  10/2022 she had atrial fibrillation detected on her monitor.  She was then switched to Eliquis.  She last saw Dr. Graciela Husbands 05/2023 and had recently been diagnosed with primary bronchogenic carcinoma.  She was started on home oxygen and felt to have too many comorbidities for surgery.  She was treated with XRT.  He noted that she has had ongoing issues with anemia and switch from Pradaxa to Eliquis due to lower risk of GI bleeding.  He also noted that she has had weakness and recurrent falls.  This occurs in the setting of orthostasis.  Her systolic blood  pressure dropped from 150 to 110 when going from supine to standing so he recommended that she no longer take metoprolol.  She remained on flecainide and amlodipine.    [name] Douglas is accompanied by her daughters and son.  They note that she has had episodes of tachycardia at home.  Although she is not symptomatic, when they checked her pulse oximeter they sometimes see that her heart rate increases to the 150s with exertion.  They also note that she has had more exertional dyspnea recently.  I confirmed that she has not been taking metoprolol since at least March 2024.  Her daughter helps with her medications each week.  [name] Douglas denies any chest pain or pressure.  Her breathing is not yet back to baseline and she is concerned about going home.  She denies lower extremity edema, orthopnea, or PND.  Her daughter also notes that she had some bright red blood in her bowel movement.  Past Medical History:  Diagnosis Date   Anxiety    Arthritis    "shoulders" (08/20/2014)   Asthma    Chronic lower back pain    Complication of anesthesia    "agitated & restless after knee replacement in 2008"   COPD (chronic obstructive pulmonary disease) (HCC)    DDD (degenerative disc disease), lumbar    Depression    Diabetes mellitus without complication (HCC)  Diverticulosis    Family history of adverse reaction to anesthesia    GERD (gastroesophageal reflux disease)    High cholesterol    Hypertension    Hypothyroidism    Memory loss    mild   On home oxygen therapy    "2L at night" (08/20/2014)   PAF (paroxysmal atrial fibrillation) (HCC)    Peripheral vascular disease (HCC)    Pre-diabetes    Shortness of breath dyspnea    with exertion   Stroke (HCC)    mild, no deficits    Past Surgical History:  Procedure Laterality Date   CARDIAC CATHETERIZATION  ~ 2007   EXCISIONAL HEMORRHOIDECTOMY  1960's   EYE SURGERY Bilateral    Cataract extraction with IOL   KNEE ARTHROSCOPY Left X 2 <2008    KNEE ARTHROSCOPY WITH SUBCHONDROPLASTY Right 04/19/2016   Procedure: KNEE ARTHROSCOPY WITH SUBCHONDROPLASTY, PARTIAL MENISCECTOMY;  Surgeon: Kennedy Bucker, MD;  Location: [facility] ORS;  Service: Orthopedics;  Laterality: Right;   LOOP RECORDER INSERTION N/A 01/02/2018   Procedure: LOOP RECORDER INSERTION;  Surgeon: Duke Salvia, MD;  Location: [facility] INVASIVE CV LAB;  Service: Cardiovascular;  Laterality: N/A;   LUMBAR LAMINECTOMY/DECOMPRESSION MICRODISCECTOMY N/A 02/06/2021   Procedure: Lumbar Three-Four, Lumbar Four-Five Laminectomy and Foraminotomy;  Surgeon: Barnett Abu, MD;  Location: [facility] OR;  Service: Neurosurgery;  Laterality: N/A;   SHOULDER OPEN ROTATOR CUFF REPAIR Right 2001   TONSILLECTOMY  ~ 1950   TOTAL KNEE ARTHROPLASTY Left 2008   TOTAL KNEE ARTHROPLASTY Right 07/03/2016   Procedure: TOTAL KNEE ARTHROPLASTY;  Surgeon: Kennedy Bucker, MD;  Location: [facility] ORS;  Service: Orthopedics;  Laterality: Right;   TUBAL LIGATION  1973   VAGINAL HYSTERECTOMY  1974     Home Medications:  Prior to Admission medications   Medication Sig Start Date End Date Taking? Authorizing Provider  albuterol (VENTOLIN HFA) 108 (90 Base) MCG/ACT inhaler Inhale 2 puffs into the lungs every 4 (four) hours as needed.   Yes [provider]  amLODipine (NORVASC) 10 MG tablet Take 1 tablet (10 mg total) by mouth daily. Increased from 5 mg. 07/27/23  Yes Darlin Priestly, MD  atorvastatin (LIPITOR) 80 MG tablet TAKE 1 TABLET BY MOUTH DAILY AT 6PM 02/13/23  Yes McDonough, Lauren K, PA-C  budesonide (PULMICORT) 0.25 MG/2ML nebulizer solution Take 2 mLs (0.25 mg total) by nebulization daily. Patient taking differently: Take 0.25 mg by nebulization in the morning and at bedtime. 07/15/23 07/14/24 Yes Emeline General, MD  dabigatran (PRADAXA) 150 MG CAPS capsule Take 1 capsule (150 mg total) by mouth every 12 (twelve) hours. 01/02/23 01/02/24 Yes Gillis Santa, MD  dexamethasone (DECADRON) 4 MG tablet Take 4 mg by mouth daily.  08/06/23 09/05/23 Yes [provider]  diphenhydramine-acetaminophen (TYLENOL PM) 25-500 MG TABS tablet Take 1 tablet by mouth at bedtime as needed.   Yes [provider]  donepezil (ARICEPT) 10 MG tablet TAKE ONE TABLET BY MOUTH AT BEDTIME 02/13/23  Yes McDonough, Lauren K, PA-C  flecainide (TAMBOCOR) 50 MG tablet TAKE 1 TABLET BY MOUTH TWICE A DAY 06/20/23  Yes Duke Salvia, MD  Fluticasone Furoate (ARNUITY ELLIPTA) 50 MCG/ACT AEPB Inhale 1 puff into the lungs 2 (two) times daily. 07/15/23 07/14/24 Yes Emeline General, MD  furosemide (LASIX) 20 MG tablet TAKE 1 TABLET BY MOUTH EVERY OTHER DAY 02/28/23  Yes Duke Salvia, MD  ipratropium-albuterol (DUONEB) 0.5-2.5 (3) MG/3ML SOLN Take 3 mLs by nebulization every 4 (four) hours as needed. 07/27/23  Yes Darlin Priestly, MD  iron polysaccharides (NIFEREX) 150 MG capsule Take 150 mg by mouth daily.   Yes [provider]  levothyroxine (SYNTHROID) 88 MCG tablet TAKE 1 TABLET BY MOUTH DAILY 06/10/23  Yes Lyndon Code, MD  loratadine (ALLERGY RELIEF) 10 MG tablet Take 10 mg by mouth daily.   Yes [provider]  sulfamethoxazole-trimethoprim (BACTRIM) 400-80 MG tablet Take 1 tablet by mouth 3 (three) times a week.  Take 1 tablet (80 mg of trimethoprim total) by mouth 3 (three) times a week for 39 days 08/07/23 09/15/23 Yes [provider]  venlafaxine XR (EFFEXOR-XR) 75 MG 24 hr capsule TAKE 2 CAPSULES BY MOUTH DAILY 02/13/23  Yes McDonough, Lauren K, PA-C  vitamin B-12 (CYANOCOBALAMIN) 500 MCG tablet Take 500 mcg by mouth daily.   Yes [provider]  vitamin C (ASCORBIC ACID) 250 MG tablet Take 500 mg by mouth daily.   Yes [provider]  mometasone-formoterol (DULERA) 100-5 MCG/ACT AERO Inhale 2 puffs into the lungs daily. 07/15/23   Emeline General, MD  triamcinolone ointment (KENALOG) 0.1 % Apply 1 Application topically 2 (two) times daily.    [provider]  Vitamin D, Ergocalciferol,  (DRISDOL) 1.25 MG (50000 UNIT) CAPS capsule Take 1 capsule by mouth weekly 11/08/22   McDonough, Salomon Fick, PA-C    Inpatient Medications: Scheduled Meds:  amLODipine  10 mg Oral Daily   atorvastatin  80 mg Oral Daily   dabigatran  150 mg Oral Q12H   dexamethasone  4 mg Oral Daily   donepezil  10 mg Oral QHS   doxycycline  100 mg Oral Q12H   flecainide  50 mg Oral BID   furosemide  20 mg Oral QODAY   iron polysaccharides  150 mg Oral Daily   levothyroxine  88 mcg Oral Q0600   loratadine  10 mg Oral Daily   melatonin  2.5 mg Oral QHS   metoprolol succinate  25 mg Oral QPM   mometasone-formoterol  2 puff Inhalation BID   pantoprazole  40 mg Oral Daily   valACYclovir  1,000 mg Oral TID   venlafaxine XR  150 mg Oral Q breakfast   Continuous Infusions:  ampicillin-sulbactam (UNASYN) IV 200 mL/hr at 08/17/23 1320   PRN Meds: guaiFENesin-dextromethorphan, HYDROmorphone (DILAUDID) injection, ipratropium-albuterol, ondansetron **OR** ondansetron (ZOFRAN) IV  Allergies:    Allergies  Allergen Reactions   Trazodone And Nefazodone Other (See Comments)    Agitation   Heparin Nausea And Vomiting    Social History:   Social History   Socioeconomic History   Marital status: Divorced    Spouse name: Not on file   Number of children: Not on file   Years of education: Not on file   Highest education level: Not on file  Occupational History   Not on file  Tobacco Use   Smoking status: Former    Current packs/day: 0.25    Average packs/day: 0.3 packs/day for 53.0 years (13.3 ttl pk-yrs)    Types: Cigarettes   Smokeless tobacco: Never   Tobacco comments:    2 cigarettes per day  Vaping Use   Vaping status: Never Used  Substance and Sexual Activity   Alcohol use: No   Drug use: No   Sexual activity: Not Currently  Other Topics Concern   Not on file  Social History Narrative   Not on file   Social Drivers of Health   Financial Resource Strain: Low Risk  (06/03/2023)  Received from [facility]   Overall Financial Resource Strain (CARDIA)    Difficulty of Paying Living Expenses: Not hard at all  Food Insecurity: No Food Insecurity (08/14/2023)   Hunger Vital Sign    Worried About Running Out of Food in the Last Year: Never true    Ran Out of Food in the Last Year: Never true  Transportation Needs: No Transportation Needs (08/14/2023)   PRAPARE - Administrator, Civil Service (Medical): No    Lack of Transportation (Non-Medical): No  Physical Activity: Not on file  Stress: Not on file  Social Connections: Not on file  Intimate Partner Violence: Not At Risk (08/14/2023)   Humiliation, Afraid, Rape, and Kick questionnaire    Fear of Current or Ex-Partner: No    Emotionally Abused: No    Physically Abused: No    Sexually Abused: No    Family History:    Family History  Problem Relation Age of Onset   Diabetes Mother    Hypertension Mother    Lung cancer Mother    Congestive Heart Failure Father    Congestive Heart Failure Sister      ROS:  Please see the history of present illness.   All other ROS reviewed and negative.     Physical Exam/Data:   Vitals:   08/16/23 1958 08/16/23 2120 08/17/23 0351 08/17/23 0843  BP: (!) 153/56  (!) 156/49 (!) 147/57  Pulse: 86  76   Resp:   16 18  Temp: 98 F (36.7 C)  97.8 F (36.6 C) 97.7 F (36.5 C)  TempSrc: Oral  Oral Oral  SpO2: 95% 95% (!) 89% 92%    Intake/Output Summary (Last 24 hours) at 08/17/2023 1654 Last data filed at 08/17/2023 1320 Gross per 24 hour  Intake 220 ml  Output --  Net 220 ml      08/09/2023   11:34 PM 07/15/2023    9:53 AM 06/19/2023    2:17 PM  Last 3 Weights  Weight (lbs) 200 lb 200 lb 2.8 oz --  Weight (kg) 90.719 kg 90.8 kg --   VS:  BP (!) 147/57 (BP Location: Left Arm)   Pulse 76   Temp 97.7 F (36.5 C) (Oral)   Resp 18   SpO2 92%  , BMI There is no height or weight on file to calculate BMI. GENERAL: Chronically  ill-appearing HEENT: Pupils equal round and reactive, fundi not visualized, oral mucosa unremarkable NECK:  No jugular venous distention, waveform within normal limits, carotid upstroke brisk and symmetric, no bruits, no thyromegaly LUNGS: Poor air movement.  Coarse breath sounds and mild expiratory wheezing HEART:  RRR.  PMI not displaced or sustained,S1 and S2 within normal limits, no S3, no S4, no clicks, no rubs, II/VI systolic murmur at the left upper sternal border ABD:  Flat, positive bowel sounds normal in frequency in pitch, no bruits, no rebound, no guarding, no midline pulsatile mass, no hepatomegaly, no splenomegaly EXT:  2 plus pulses throughout, trace edema, no cyanosis no clubbing SKIN:  No rashes no nodules NEURO:  Cranial nerves II through XII grossly intact, motor grossly intact throughout PSYCH:  Cognitively intact, oriented to person place and time'  EKG:  The EKG was personally reviewed and demonstrates:  Multifocal atrial tachycardia.  Rate 114 bpm.  Low voltage. Telemetry:  Telemetry was personally reviewed and demonstrates: MAT.  Up to 8 seconds SVT.  Relevant CV Studies:  Echo 01/02/23:  1. Left ventricular  ejection fraction, by estimation, is 60 to 65%. The  left ventricle has normal function. The left ventricle has no regional  wall motion abnormalities. There is mild left ventricular hypertrophy.  Left ventricular diastolic parameters  are consistent with Grade II diastolic dysfunction (pseudonormalization).   2. Right ventricular systolic function is normal. The right ventricular  size is normal. There is moderately elevated pulmonary artery systolic  pressure.   3. Left atrial size was moderately dilated.   4. The mitral valve is degenerative. Mild mitral valve regurgitation.   5. Tricuspid valve regurgitation is mild to moderate.   6. The aortic valve is tricuspid. Aortic valve regurgitation is mild.  Aortic valve sclerosis is present, with no evidence of  aortic valve  stenosis.   7. The inferior vena cava is normal in size with greater than 50%  respiratory variability, suggesting right atrial pressure of 3 mmHg.   Laboratory Data:  High Sensitivity Troponin:   Recent Labs  Lab 08/14/23 0630 08/14/23 0928  TROPONINIHS 34* 36*     Chemistry Recent Labs  Lab 08/14/23 0630 08/16/23 0434 08/17/23 0710  NA 137 138 141  K 4.1 4.2 4.0  CL 98 97* 103  CO2 28 28 28   GLUCOSE 124* 182* 210*  BUN 32* 30* 30*  CREATININE 1.04* 0.90 0.88  CALCIUM 8.8* 9.2 8.8*  MG  --  2.0 2.0  GFRNONAA 53* >60 >60  ANIONGAP 11 13 10     Recent Labs  Lab 08/14/23 0630 08/16/23 0434 08/17/23 0710  PROT 6.4* 6.0* 5.8*  ALBUMIN 3.3* 3.2* 2.9*  AST 25 23 23   ALT 31 31 31   ALKPHOS 73 78 77  BILITOT 0.7 0.7 0.9   Lipids No results for input(s): "CHOL", "TRIG", "HDL", "LABVLDL", "LDLCALC", "CHOLHDL" in the last 168 hours.  Hematology Recent Labs  Lab 08/15/23 0452 08/16/23 0434 08/17/23 0710  WBC 12.6* 11.6* 10.0  RBC 3.86* 3.78* 3.57*  HGB 11.2* 10.9* 10.2*  HCT 35.0* 33.7* 31.3*  MCV 90.7 89.2 87.7  MCH 29.0 28.8 28.6  MCHC 32.0 32.3 32.6  RDW 14.2 14.0 14.2  PLT 203 190 183   Thyroid No results for input(s): "TSH", "FREET4" in the last 168 hours.  BNP Recent Labs  Lab 08/14/23 0630  BNP 168.2*    DDimer  Recent Labs  Lab 08/14/23 0630  DDIMER 1.63*   Radiology/Studies:  CT Angio Chest PE W and/or Wo Contrast Result Date: 08/14/2023 CLINICAL DATA:  82 year old female with history of acute on chronic respiratory failure and elevated D-dimer. Evaluate for potential pulmonary embolism. History of lung cancer. * Tracking Code: BO * EXAM: CT ANGIOGRAPHY CHEST WITH CONTRAST TECHNIQUE: Multidetector CT imaging of the chest was performed using the standard protocol during bolus administration of intravenous contrast. Multiplanar CT image reconstructions and MIPs were obtained to evaluate the vascular anatomy. RADIATION DOSE REDUCTION:  This exam was performed according to the departmental dose-optimization program which includes automated exposure control, adjustment of the mA and/or kV according to patient size and/or use of iterative reconstruction technique. CONTRAST:  75mL OMNIPAQUE IOHEXOL 350 MG/ML SOLN COMPARISON:  Chest CTA 07/15/2023. FINDINGS: Cardiovascular: No filling defects are noted within the pulmonary arterial tree to suggest pulmonary embolism. Heart size is mildly enlarged with concentric left ventricular hypertrophy. There is no significant pericardial fluid, thickening or pericardial calcification. There is aortic atherosclerosis, as well as atherosclerosis of the great vessels of the mediastinum and the coronary arteries, including calcified atherosclerotic plaque in the left main, left  anterior descending, left circumflex and right coronary arteries. Mild calcifications of the aortic valve. Moderate calcifications of the mitral annulus. Dilatation of the pulmonic trunk (3.7 cm in diameter), concerning for pulmonary arterial hypertension. Mediastinum/Nodes: Extensive right hilar and mediastinal lymphadenopathy appears progressive compared to the prior study. Specific examples include a 1.7 cm short axis right hilar lymph node (axial image 53 of series 4), 1.6 cm short axis low right paratracheal lymph node (axial image 45 of series 4), 2.1 cm right paratracheal lymph node (axial image 39 of series 4), and a 1.3 cm superior mediastinal lymph node to the right of the proximal esophagus (axial image 18 of series 4), all of which appears slightly larger than the prior examination. Esophagus is unremarkable in appearance. No axillary lymphadenopathy. Lungs/Pleura: Patchy areas of ground-glass attenuation and septal thickening are again noted in the right lung, most severe in the right upper lobe, most compatible with evolving areas of postradiation pneumonitis and developing fibrosis. Previously noted hypermetabolic right upper  lobe pulmonary nodule is not clearly defined on today's examination, suggesting a positive response to therapy. There is a new nodular density in the periphery of the right upper lobe (axial image 50 of series 5) measuring 7 x 5 mm (mean diameter of 6 mm) which warrants attention on follow-up studies. Additional patchy areas of ground-glass attenuation and peribronchovascular nodularity are noted elsewhere in the lungs bilaterally, most evident in the lower lobes of the lungs, likely of infectious or inflammatory etiology. No pleural effusions. Upper Abdomen: Aortic atherosclerosis. Musculoskeletal: There are no aggressive appearing lytic or blastic lesions noted in the visualized portions of the skeleton. Review of the MIP images confirms the above findings. IMPRESSION: 1. No evidence of pulmonary embolism. 2. Evolving posttreatment changes of radiation therapy in the right lung with regression of the original right upper lobe pulmonary nodule. However, today's study demonstrates evidence suggesting progressive nodal metastatic disease in the right hilar and mediastinal nodal stations, as above. 3. Findings in the lung bases concerning for active infection/inflammation. Clinical correlation for signs and symptoms of aspiration pneumonia is recommended. 4. Dilatation of the pulmonic trunk (3.7 cm in diameter), concerning for pulmonary arterial hypertension. 5. Mild cardiomegaly with concentric left ventricular hypertrophy. 6. Aortic atherosclerosis, in addition to left main and three-vessel coronary artery disease. 7. There are calcifications of the aortic valve and mitral annulus. Echocardiographic correlation for evaluation of potential valvular dysfunction may be warranted if clinically indicated. Aortic Atherosclerosis (ICD10-I70.0). Electronically Signed   By: Trudie Reed M.D.   On: 08/14/2023 08:18   DG Chest Portable 1 View Result Date: 08/14/2023 CLINICAL DATA:  82 year old female with cough,  shortness of breath, right side chest pain. History of recently treated lung cancer. EXAM: PORTABLE CHEST 1 VIEW COMPARISON:  Portable chest 07/24/2023 and earlier. Chest CTA 07/15/2023. FINDINGS: Portable AP upright view at 0636 hours. Cardiomegaly, coarse right upper lung interstitial opacity and indistinct bilateral lung base opacity has not significantly changed from earlier this month. No superimposed pneumothorax, pulmonary edema. Small pleural effusions seen last month probably persist. Stable cardiac size and mediastinal contours. Visualized tracheal air column is within normal limits. Stable visualized osseous structures. Paucity of bowel gas. IMPRESSION: 1. Stable post treatment appearance of the chest. Unchanged right upper lung reticulonodular opacity which might be post radiation sequelae. Probable ongoing small pleural effusions. 2. No new cardiopulmonary abnormality. Electronically Signed   By: Odessa Fleming M.D.   On: 08/14/2023 06:46     Assessment and Plan:   #  MAT:  # SVT:  # Hypertension: # Orthostatic hypotension: Multiple episodes of SVT.  Also persistent MAT.  The MAT is likely due to the exacerbation of her chronic underlying respiratory disease.  This should improve with time.  She has not been on her beta-blocker for many months.  This was discontinued due to concerns for orthostatic hypotension.  If she is having orthostasis, would recommend reducing amlodipine rather than stopping the metoprolol.  We will add back metoprolol 25 mg daily.  She was taking 50 mg chronically.  Check orthostatic vital signs before determining whether amlodipine needs to be reduced.  Would aim for systolic blood pressures in the 140s.  We discussed the importance of hydration and both she and her daughter think that she is drinking well.  Recommend compression stockings she is drinking well and orthostatic.  # Coronary calcification: # Aortic atherosclerosis: Given that she has coronary disease and  aortic atherosclerosis noted on chest CT, I am not sure that flecainide is the best option for her.  Recommend that she discuss this with Dr. Graciela Husbands as an outpatient.  Adding metoprolol as above for now.  # PAF: Adding metoprolol.  Continue Pradaxa.   Risk Assessment/Risk Scores:     CHA2DS2-VASc Score = 8   This indicates a 10.8% annual risk of stroke. The patient's score is based upon: CHF History: 1 HTN History: 1 Diabetes History: 0 Stroke History: 2 Vascular Disease History: 1 Age Score: 2 Gender Score: 1     For questions or updates, please contact [facility] Please consult [email or web address] for contact info under    Signed, Chilton Si, MD  08/17/2023 4:54 PM

## 2023-08-18 DIAGNOSIS — J441 Chronic obstructive pulmonary disease with (acute) exacerbation: Secondary | ICD-10-CM

## 2023-08-18 DIAGNOSIS — I4719 Other supraventricular tachycardia: Secondary | ICD-10-CM | POA: Diagnosis not present

## 2023-08-18 DIAGNOSIS — J690 Pneumonitis due to inhalation of food and vomit: Secondary | ICD-10-CM | POA: Diagnosis not present

## 2023-08-18 LAB — COMPREHENSIVE METABOLIC PANEL
ALT: 33 U/L (ref 0–44)
AST: 23 U/L (ref 15–41)
Albumin: 2.9 g/dL — ABNORMAL LOW (ref 3.5–5.0)
Alkaline Phosphatase: 78 U/L (ref 38–126)
Anion gap: 11 (ref 5–15)
BUN: 24 mg/dL — ABNORMAL HIGH (ref 8–23)
CO2: 28 mmol/L (ref 22–32)
Calcium: 8.7 mg/dL — ABNORMAL LOW (ref 8.9–10.3)
Chloride: 101 mmol/L (ref 98–111)
Creatinine, Ser: 0.77 mg/dL (ref 0.44–1.00)
GFR, Estimated: >60 mL/min (ref >60)
Glucose, Bld: 151 mg/dL — ABNORMAL HIGH (ref 70–99)
Potassium: 4 mmol/L (ref 3.5–5.1)
Sodium: 140 mmol/L (ref 135–145)
Total Bilirubin: 1.2 mg/dL — ABNORMAL HIGH (ref <1.2)
Total Protein: 5.6 g/dL — ABNORMAL LOW (ref 6.5–8.1)

## 2023-08-18 LAB — CBC WITH DIFFERENTIAL/PLATELET
Abs Immature Granulocytes: 0.26 10*3/uL — ABNORMAL HIGH (ref 0.00–0.07)
Basophils Absolute: 0.1 10*3/uL (ref 0.0–0.1)
Basophils Relative: 1 %
Eosinophils Absolute: 0.1 10*3/uL (ref 0.0–0.5)
Eosinophils Relative: 1 %
HCT: 32.8 % — ABNORMAL LOW (ref 36.0–46.0)
Hemoglobin: 10.7 g/dL — ABNORMAL LOW (ref 12.0–15.0)
Immature Granulocytes: 3 %
Lymphocytes Relative: 6 %
Lymphs Abs: 0.6 10*3/uL — ABNORMAL LOW (ref 0.7–4.0)
MCH: 29 pg (ref 26.0–34.0)
MCHC: 32.6 g/dL (ref 30.0–36.0)
MCV: 88.9 fL (ref 80.0–100.0)
Monocytes Absolute: 0.4 10*3/uL (ref 0.1–1.0)
Monocytes Relative: 4 %
Neutro Abs: 8.6 10*3/uL — ABNORMAL HIGH (ref 1.7–7.7)
Neutrophils Relative %: 85 %
Platelets: 162 10*3/uL (ref 150–400)
RBC: 3.69 MIL/uL — ABNORMAL LOW (ref 3.87–5.11)
RDW: 14.2 % (ref 11.5–15.5)
WBC: 10 10*3/uL (ref 4.0–10.5)
nRBC: 0 % (ref 0.0–0.2)

## 2023-08-18 LAB — MAGNESIUM: Magnesium: 2.1 mg/dL (ref 1.7–2.4)

## 2023-08-18 LAB — PHOSPHORUS: Phosphorus: 2.9 mg/dL (ref 2.5–4.6)

## 2023-08-18 MED ORDER — METOPROLOL SUCCINATE ER 25 MG PO TB24: 25.0000 mg | ORAL_TABLET | Freq: Every evening | ORAL | 0 refills | Status: AC

## 2023-08-18 MED ORDER — AMOXICILLIN-POT CLAVULANATE 875-125 MG PO TABS: 1.0000 | ORAL_TABLET | Freq: Two times a day (BID) | ORAL | 0 refills | Status: DC

## 2023-08-18 MED ORDER — VALACYCLOVIR HCL 1 G PO TABS: 1000.0000 mg | ORAL_TABLET | Freq: Three times a day (TID) | ORAL | 0 refills | Status: DC

## 2023-08-18 MED ORDER — PANTOPRAZOLE SODIUM 40 MG PO TBEC: 40.0000 mg | DELAYED_RELEASE_TABLET | Freq: Every day | ORAL | 0 refills | Status: AC

## 2023-08-18 NOTE — Progress Notes (Signed)
Patient Name: [name] Douglas Date of Encounter: 08/18/2023 [facility] Cardiologist: None   Interval Summary  .    Feeling tired today.  Denies chest pain or palpitations.   Vital Signs .    Vitals:   08/17/23 1823 08/17/23 1942 08/18/23 0438 08/18/23 0818  BP: (!) 155/55 (!) 152/53 (!) 140/59 (!) 145/40  Pulse: 68 81 (!) 53 74  Resp: 18 20 (!) 24 20  Temp: 98.3 F (36.8 C) 97.9 F (36.6 C) 97.7 F (36.5 C) 98.4 F (36.9 C)  TempSrc: Oral Oral Oral Oral  SpO2: 96% 97% 94% 95%    Intake/Output Summary (Last 24 hours) at 08/18/2023 1503 Last data filed at 08/17/2023 2348 Gross per 24 hour  Intake 240 ml  Output 400 ml  Net -160 ml      08/09/2023   11:34 PM 07/15/2023    9:53 AM 06/19/2023    2:17 PM  Last 3 Weights  Weight (lbs) 200 lb 200 lb 2.8 oz --  Weight (kg) 90.719 kg 90.8 kg --      Telemetry/ECG    Sinus rhythm, MAT.  PVCs.  Rate <100 bpm.  - Personally Reviewed  Physical Exam .    VS:  BP (!) 145/40 (BP Location: Right Arm)   Pulse 74   Temp 98.4 F (36.9 C) (Oral)   Resp 20   SpO2 95%  , BMI There is no height or weight on file to calculate BMI. GENERAL:  Mildly ill-appearing HEENT: Pupils equal round and reactive, fundi not visualized, oral mucosa unremarkable NECK:  No jugular venous distention, waveform within normal limits, carotid upstroke brisk and symmetric, no bruits, no thyromegaly LUNGS:  Bilateral rhonchi.  No crackles or wheezes HEART:  RRR.  PMI not displaced or sustained,S1 and S2 within normal limits, no S3, no S4, no clicks, no rubs, no murmurs ABD:  Flat, positive bowel sounds normal in frequency in pitch, no bruits, no rebound, no guarding, no midline pulsatile mass, no hepatomegaly, no splenomegaly EXT:  2 plus pulses throughout, no edema, no cyanosis no clubbing SKIN:  No rashes no nodules NEURO:  Cranial nerves II through XII grossly intact, motor grossly intact throughout PSYCH:  Cognitively intact, oriented  to person place and time   Assessment & Plan .     LUDELLA THIBEAULT is a 82 y.o. female with a hx of bronchogenic lung carcinoma status post XRT, COPD on 1-2 L, HFpEF, CVA, TIA, SVT admitted with HCAP 2/2 and Herpes Zoster.  Cardiology consulted for tachycardia.   # MAT:  # SVT:  # Hypertension: # Orthostatic hypotension: Multiple episodes of SVT.  Also persistent MAT.  The MAT is likely due to the exacerbation of her chronic underlying respiratory disease.  This should improve with time.  She has not been on her beta-blocker for many months.  This was discontinued due to concerns for orthostatic hypotension.  If she is having orthostasis, would recommend reducing amlodipine rather than stopping the metoprolol.  Metoprolol 25mg  at bedtime was added.  She was taking 50 mg chronically.  Check orthostatic vital signs before determining whether amlodipine needs to be reduced.  Would aim for systolic blood pressures in the 140s.  We discussed the importance of hydration and both she and her daughter think that she is drinking well.  Recommend compression stockings she is drinking well and orthostatic.   # Coronary calcification: # Aortic atherosclerosis: Given that she has coronary disease and aortic atherosclerosis noted  on chest CT, I am not sure that flecainide is the best option for her.  Recommend that she discuss this with Dr. Graciela Husbands as an outpatient.  Adding metoprolol as above for now.   # PAF: Adding metoprolol.  Continue Pradaxa.  For questions or updates, please contact [facility] Please consult [email or web address] for contact info under        Signed, Chilton Si, MD

## 2023-08-18 NOTE — Progress Notes (Signed)
PROGRESS NOTE    [name] Douglas  ZOX:[id number] DOB: 1940/09/29 DOA: 08/14/2023 PCP: Mick Sell, MD  Chief Complaint  Patient presents with   Shortness of Breath    Pt came from home with history of Lung CA with radiation treatments completed and COPD. Pt came to ED with nonproductive spontaneous cough and SOB. Pt reports wearing home O2 at 1-2L [place] but is requiring more oxygen at present with current need being 3L oxygen [place].     Hospital Course:  [name] Douglas is 82 y.o. female with recently diagnosed non-small cell carcinoma right upper lung, status post radiation therapy in 04/2023, paroxysmal A-fib on Pradaxa and flecainide, COPD, chronic hypoxic respiratory failure on 3-4 L continuously, hypertension, chronic heart failure with preserved EF, who presents with dry cough and right-sided chest pain. Patient endorses that she frequently feels that the food is stuck in her chest, but denies any coughing or choking after eating.  On arrival patient was afebrile, mildly tachycardic to 106.  WBC 11.6, CTA negative for PE but noted right lung nodular changes in the right upper lung and findings consistent with right lower lobe aspiration pneumonia.  She was started on abx and admitted for further work up.  Subjective: This morning the patient's 3 children Oswaldo Conroy, and son Gerlene Burdock are at bedside.  They are endorsing concerns about her returning home.  The patient currently lives alone and is not interested in moving in with any of the children.  She is requesting discharge to skilled nursing facility as an alternative.  The children are on board with this plan. We also discussed her overall prognosis, extensive comorbidities including chronic hypoxic respiratory failure, COPD, heart failure, and lung cancer.  We discussed options like palliative care and hospice in lieu of continued aggressive medical treatment.  At this time family would like to pursue SNF for rehab potential to increase  patient's strength and mobility prior to returning home or to other independent living setting. Of note, the patient Mrs. Andriola was alert and oriented and following throughout this conversation.  She requested that healthcare decisions to be directed to her daughter Carollee Herter if needed.  They are working on filling out legal POA documents.  Bedside RN Altamease Oiler also present for this discussion.  Objective: Vitals:   08/17/23 1823 08/17/23 1942 08/18/23 0438 08/18/23 0818  BP: (!) 155/55 (!) 152/53 (!) 140/59 (!) 145/40  Pulse: 68 81 (!) 53 74  Resp: 18 20 (!) 24 20  Temp: 98.3 F (36.8 C) 97.9 F (36.6 C) 97.7 F (36.5 C) 98.4 F (36.9 C)  TempSrc: Oral Oral Oral Oral  SpO2: 96% 97% 94% 95%    Intake/Output Summary (Last 24 hours) at 08/18/2023 1627 Last data filed at 08/17/2023 2348 Gross per 24 hour  Intake 240 ml  Output 400 ml  Net -160 ml    There were no vitals filed for this visit.  Examination: General exam: No acute distress, calm, nontoxic-appearing  respiratory system: symmetric chest rise, no work of breathing appreciated, less dyspnea with talking than prior evals cardiovascular system: S1 & S2 heard, RRR, irregular rhythm.  Gastrointestinal system: Abdomen is nondistended, soft and nontender.  Neuro: Alert and oriented. No focal neurological deficits. Extremities: Symmetric, expected ROM Psychiatry: Demonstrates appropriate judgement and insight. Mood & affect appropriate for situation.   Assessment & Plan:  Principal Problem:   PNA (pneumonia) Active Problems:   COPD with acute exacerbation (HCC)   Chest pain   CAP (community  acquired pneumonia)   Aspiration pneumonia (HCC)   Pneumonia   Multifocal atrial tachycardia (HCC)     Paroxysmal A-fib, intermittent RVR - Rapid response called 12/28 for A-fib RVR, resolved with IV metoprolol.  Provoked by exertion and albuterol - Cardiology reengaged - Metoprolol added 12/28.  If blood pressure downtrends we  will discontinue amlodipine in favor of metoprolol and flecainide - Patient needs to follow-up with her outpatient established cardiologist regarding flecainide dosing in the future -- She does also have history of SVT responsive to adenosine - Continue flecainide and Pradaxa  Acute on chronic hypoxic respiratory failure, at baseline now - Secondary to right lower lobe pneumonia and COPD exacerbation - Currently on Unasyn and doxycycline to cover for aspiration and atypical pneumonias. Transition to Augmentin outpt. - Legionella and mycoplasma neg -flu/RSV/COVID/respiratory panel negative - Breathing treatments RPN -- Baseline oxygen requirement: 3 L at rest, 4L when mobile.  Previously established home O2  Acute COPD exacerbation - Patient had recent COPD exacerbation and was started on long tapering Decadron with end date 1/16.  Patient is now status post IV Solu-Medrol x 1. - Will continue p.o. Decadron and treat pneumonia as above - Continue with LABA every 6 hours.  DuoNebs as needed - Incentive spirometry and flutter valve  Shingles - New dermatomal right-sided flank rash.  No obvious vesicular lesions, however given unilaterality and dermatomal distribution, combined with patient age and immunocompromised state, shingles is likely.  Have initiated on valacyclovir. - Continue valacyclovir for at least 7 days, consider a longer course up to 14days given active lung cancer.  Sepsis - No evidence of acute endorgan damage - Criteria: Hypoxemia, tachycardia, lactic acidosis, aspiration pneumonia source - Management as above  Chest pain, resolved - Multifactorial.  In some part may be related to possible shingles which is just now presenting with rash.  Also Likely component of pleuritis secondary to right-sided lung cancer with metastatic lesions as well as new onset pneumonia. - CTA negative for PE - Troponin negative x 2  Globus Sensation - There was concern for aspiration,  speech therapy consulted.  No report of oropharyngeal phase dysphagia during their assessment.  No clinical signs or symptoms of aspiration. - Reportedly patient has baseline esophageal phase dismotility.  Has outpatient follow-up with GI for this. - Initiate daily PPI for GERD  Right upper lung non-small cell lung cancer - Chest CT 12/25 reveals progression of right upper lung nodules as well as enlarged right hilar nodules.  Patient was made aware of these results.  She is following with oncology next week  Recent rectal bleeding - Prior ED visit 5 days prior to arrival for acute rectal bleeding, stopped same night and no further episodes - Hemoglobin stable at this time - Follow-up outpatient GI  Hypertension - Continue amlodipine   DVT prophylaxis: Pradaxa   Code Status: Full Code Family Communication: Family discussion as above Disposition:  Status is: Medically stable for discharge, she appears to be at her baseline now.  TOC consult for SNF placement per family request.   Antimicrobials:  Anti-infectives (From admission, onward)    Start     Dose/Rate Route Frequency Ordered Stop   08/18/23 0000  valACYclovir (VALTREX) 1000 MG tablet        1,000 mg Oral 3 times daily 08/18/23 1251 08/25/23 2359   08/18/23 0000  amoxicillin-clavulanate (AUGMENTIN) 875-125 MG tablet        1 tablet Oral 2 times daily 08/18/23 1251 08/23/23 2359  08/16/23 1000  valACYclovir (VALTREX) tablet 1,000 mg        1,000 mg Oral 3 times daily 08/16/23 0537     08/15/23 1000  cefTRIAXone (ROCEPHIN) 2 g in sodium chloride 0.9 % 100 mL IVPB  Status:  Discontinued        2 g 200 mL/hr over 30 Minutes Intravenous Every 24 hours 08/14/23 1004 08/14/23 1005   08/15/23 1000  azithromycin (ZITHROMAX) tablet 500 mg  Status:  Discontinued        500 mg Oral Daily 08/14/23 1004 08/14/23 1004   08/15/23 1000  doxycycline (VIBRA-TABS) tablet 100 mg        100 mg Oral Every 12 hours 08/14/23 1004     08/14/23  1200  Ampicillin-Sulbactam (UNASYN) 3 g in sodium chloride 0.9 % 100 mL IVPB        3 g 200 mL/hr over 30 Minutes Intravenous Every 6 hours 08/14/23 1037     08/14/23 0845  cefTRIAXone (ROCEPHIN) 1 g in sodium chloride 0.9 % 100 mL IVPB        1 g 200 mL/hr over 30 Minutes Intravenous  Once 08/14/23 0843 08/14/23 0932   08/14/23 0845  azithromycin (ZITHROMAX) 500 mg in sodium chloride 0.9 % 250 mL IVPB        500 mg 250 mL/hr over 60 Minutes Intravenous  Once 08/14/23 0843 08/14/23 1054       Data Reviewed: I have personally reviewed following labs and imaging studies CBC: Recent Labs  Lab 08/14/23 0630 08/15/23 0452 08/16/23 0434 08/17/23 0710 08/18/23 0557  WBC 11.6* 12.6* 11.6* 10.0 10.0  NEUTROABS 9.9*  --  10.3* 9.0* 8.6*  HGB 11.8* 11.2* 10.9* 10.2* 10.7*  HCT 34.4* 35.0* 33.7* 31.3* 32.8*  MCV 89.1 90.7 89.2 87.7 88.9  PLT 273 203 190 183 162   Basic Metabolic Panel: Recent Labs  Lab 08/14/23 0630 08/16/23 0434 08/17/23 0710 08/18/23 0557  NA 137 138 141 140  K 4.1 4.2 4.0 4.0  CL 98 97* 103 101  CO2 28 28 28 28   GLUCOSE 124* 182* 210* 151*  BUN 32* 30* 30* 24*  CREATININE 1.04* 0.90 0.88 0.77  CALCIUM 8.8* 9.2 8.8* 8.7*  MG  --  2.0 2.0 2.1  PHOS  --  3.4 3.6 2.9   GFR: Estimated Creatinine Clearance: 58.1 mL/min (by C-G formula based on SCr of 0.77 mg/dL). Liver Function Tests: Recent Labs  Lab 08/14/23 0630 08/16/23 0434 08/17/23 0710 08/18/23 0557  AST 25 23 23 23   ALT 31 31 31  33  ALKPHOS 73 78 77 78  BILITOT 0.7 0.7 0.9 1.2*  PROT 6.4* 6.0* 5.8* 5.6*  ALBUMIN 3.3* 3.2* 2.9* 2.9*   CBG: Recent Labs  Lab 08/17/23 0941  GLUCAP 228*    Recent Results (from the past 240 hours)  Blood culture (routine x 2)     Status: None (Preliminary result)   Collection Time: 08/14/23  6:30 AM   Specimen: BLOOD RIGHT ARM  Result Value Ref Range Status   Specimen Description BLOOD RIGHT ARM  Final   Special Requests   Final    BOTTLES DRAWN AEROBIC  AND ANAEROBIC Blood Culture results may not be optimal due to an inadequate volume of blood received in culture bottles   Culture   Final    NO GROWTH 4 DAYS Performed at [facility], [address] [place], [place] [address]    Report Status PENDING  Incomplete  Blood culture (  routine x 2)     Status: None (Preliminary result)   Collection Time: 08/14/23  6:31 AM   Specimen: BLOOD  Result Value Ref Range Status   Specimen Description BLOOD LEFT AC  Final   Special Requests   Final    BOTTLES DRAWN AEROBIC AND ANAEROBIC Blood Culture results may not be optimal due to an inadequate volume of blood received in culture bottles   Culture   Final    NO GROWTH 4 DAYS Performed at [facility], [address], [place], [place] [address]    Report Status PENDING  Incomplete  Resp panel by RT-PCR (RSV, Flu A&B, Covid) Anterior Nasal Swab     Status: None   Collection Time: 08/14/23  6:32 AM   Specimen: Anterior Nasal Swab  Result Value Ref Range Status   SARS Coronavirus 2 by RT PCR NEGATIVE NEGATIVE Final    Comment: (NOTE) SARS-CoV-2 target nucleic acids are NOT DETECTED.  The SARS-CoV-2 RNA is generally detectable in upper respiratory specimens during the acute phase of infection. The lowest concentration of SARS-CoV-2 viral copies this assay can detect is 138 copies/mL. A negative result does not preclude SARS-Cov-2 infection and should not be used as the sole basis for treatment or other patient management decisions. A negative result may occur with  improper specimen collection/handling, submission of specimen other than nasopharyngeal swab, presence of viral mutation(s) within the areas targeted by this assay, and inadequate number of viral copies(<138 copies/mL). A negative result must be combined with clinical observations, patient history, and epidemiological information. The expected result is Negative.  Fact Sheet for Patients:   BloggerCourse.com  Fact Sheet for Healthcare Providers:  SeriousBroker.it  This test is no t yet approved or cleared by the [place] FDA and  has been authorized for detection and/or diagnosis of SARS-CoV-2 by FDA under an Emergency Use Authorization (EUA). This EUA will remain  in effect (meaning this test can be used) for the duration of the COVID-19 declaration under Section 564(b)(1) of the Act, 21 U.S.C.section 360bbb-3(b)(1), unless the authorization is terminated  or revoked sooner.       Influenza A by PCR NEGATIVE NEGATIVE Final   Influenza B by PCR NEGATIVE NEGATIVE Final    Comment: (NOTE) The Xpert Xpress SARS-CoV-2/FLU/RSV plus assay is intended as an aid in the diagnosis of influenza from Nasopharyngeal swab specimens and should not be used as a sole basis for treatment. Nasal washings and aspirates are unacceptable for Xpert Xpress SARS-CoV-2/FLU/RSV testing.  Fact Sheet for Patients: BloggerCourse.com  Fact Sheet for Healthcare Providers: SeriousBroker.it  This test is not yet approved or cleared by the [place] FDA and has been authorized for detection and/or diagnosis of SARS-CoV-2 by FDA under an Emergency Use Authorization (EUA). This EUA will remain in effect (meaning this test can be used) for the duration of the COVID-19 declaration under Section 564(b)(1) of the Act, 21 U.S.C. section 360bbb-3(b)(1), unless the authorization is terminated or revoked.     Resp Syncytial Virus by PCR NEGATIVE NEGATIVE Final    Comment: (NOTE) Fact Sheet for Patients: BloggerCourse.com  Fact Sheet for Healthcare Providers: SeriousBroker.it  This test is not yet approved or cleared by the [place] FDA and has been authorized for detection and/or diagnosis of SARS-CoV-2 by FDA under an Emergency Use  Authorization (EUA). This EUA will remain in effect (meaning this test can be used) for the duration of the COVID-19 declaration under Section 564(b)(1) of the Act, 21  U.S.C. section 360bbb-3(b)(1), unless the authorization is terminated or revoked.  Performed at [facility], [address], [place], [place] [address]      Radiology Studies: No results found.   Scheduled Meds:  amLODipine  10 mg Oral Daily   atorvastatin  80 mg Oral Daily   dabigatran  150 mg Oral Q12H   dexamethasone  4 mg Oral Daily   donepezil  10 mg Oral QHS   doxycycline  100 mg Oral Q12H   flecainide  50 mg Oral BID   furosemide  20 mg Oral QODAY   iron polysaccharides  150 mg Oral Daily   levothyroxine  88 mcg Oral Q0600   loratadine  10 mg Oral Daily   melatonin  2.5 mg Oral QHS   metoprolol succinate  25 mg Oral QPM   mometasone-formoterol  2 puff Inhalation BID   pantoprazole  40 mg Oral Daily   valACYclovir  1,000 mg Oral TID   venlafaxine XR  150 mg Oral Q breakfast   Continuous Infusions:  ampicillin-sulbactam (UNASYN) IV 3 g (08/18/23 1500)     LOS: 3 days    Time spent:   Debarah Crape, DO [facility]  To contact the attending physician between 7A-7P please use Epic Chat. To contact the covering physician during after hours 7P-7A, please review Amion.   08/18/2023, 4:27 PM   *This document has been created with the assistance of dictation software. Please excuse typographical errors. *

## 2023-08-18 NOTE — Plan of Care (Signed)

## 2023-08-19 ENCOUNTER — Other Ambulatory Visit: Payer: Self-pay | Admitting: *Deleted

## 2023-08-19 DIAGNOSIS — R911 Solitary pulmonary nodule: Secondary | ICD-10-CM

## 2023-08-19 DIAGNOSIS — I4719 Other supraventricular tachycardia: Secondary | ICD-10-CM

## 2023-08-19 DIAGNOSIS — J440 Chronic obstructive pulmonary disease with acute lower respiratory infection: Secondary | ICD-10-CM | POA: Diagnosis not present

## 2023-08-19 DIAGNOSIS — I480 Paroxysmal atrial fibrillation: Secondary | ICD-10-CM | POA: Diagnosis not present

## 2023-08-19 DIAGNOSIS — Z8673 Personal history of transient ischemic attack (TIA), and cerebral infarction without residual deficits: Secondary | ICD-10-CM | POA: Diagnosis not present

## 2023-08-19 DIAGNOSIS — J189 Pneumonia, unspecified organism: Secondary | ICD-10-CM | POA: Diagnosis not present

## 2023-08-19 DIAGNOSIS — C3411 Malignant neoplasm of upper lobe, right bronchus or lung: Secondary | ICD-10-CM | POA: Diagnosis not present

## 2023-08-19 DIAGNOSIS — I110 Hypertensive heart disease with heart failure: Secondary | ICD-10-CM | POA: Diagnosis not present

## 2023-08-19 DIAGNOSIS — J441 Chronic obstructive pulmonary disease with (acute) exacerbation: Secondary | ICD-10-CM | POA: Diagnosis not present

## 2023-08-19 LAB — CBC WITH DIFFERENTIAL/PLATELET
Abs Immature Granulocytes: 0.49 10*3/uL — ABNORMAL HIGH (ref 0.00–0.07)
Basophils Absolute: 0 10*3/uL (ref 0.0–0.1)
Basophils Relative: 0 %
Eosinophils Absolute: 0.1 10*3/uL (ref 0.0–0.5)
Eosinophils Relative: 0 %
HCT: 33.4 % — ABNORMAL LOW (ref 36.0–46.0)
Hemoglobin: 11 g/dL — ABNORMAL LOW (ref 12.0–15.0)
Immature Granulocytes: 4 %
Lymphocytes Relative: 8 %
Lymphs Abs: 0.9 10*3/uL (ref 0.7–4.0)
MCH: 29.1 pg (ref 26.0–34.0)
MCHC: 32.9 g/dL (ref 30.0–36.0)
MCV: 88.4 fL (ref 80.0–100.0)
Monocytes Absolute: 0.5 10*3/uL (ref 0.1–1.0)
Monocytes Relative: 4 %
Neutro Abs: 9.5 10*3/uL — ABNORMAL HIGH (ref 1.7–7.7)
Neutrophils Relative %: 84 %
Platelets: 203 10*3/uL (ref 150–400)
RBC: 3.78 MIL/uL — ABNORMAL LOW (ref 3.87–5.11)
RDW: 14.1 % (ref 11.5–15.5)
WBC: 11.4 10*3/uL — ABNORMAL HIGH (ref 4.0–10.5)
nRBC: 0 % (ref 0.0–0.2)

## 2023-08-19 LAB — COMPREHENSIVE METABOLIC PANEL
ALT: 35 U/L (ref 0–44)
AST: 27 U/L (ref 15–41)
Albumin: 3 g/dL — ABNORMAL LOW (ref 3.5–5.0)
Alkaline Phosphatase: 88 U/L (ref 38–126)
Anion gap: 12 (ref 5–15)
BUN: 33 mg/dL — ABNORMAL HIGH (ref 8–23)
CO2: 26 mmol/L (ref 22–32)
Calcium: 8.7 mg/dL — ABNORMAL LOW (ref 8.9–10.3)
Chloride: 99 mmol/L (ref 98–111)
Creatinine, Ser: 1.01 mg/dL — ABNORMAL HIGH (ref 0.44–1.00)
GFR, Estimated: 55 mL/min — ABNORMAL LOW (ref >60)
Glucose, Bld: 155 mg/dL — ABNORMAL HIGH (ref 70–99)
Potassium: 4.2 mmol/L (ref 3.5–5.1)
Sodium: 137 mmol/L (ref 135–145)
Total Bilirubin: 1 mg/dL (ref <1.2)
Total Protein: 6.2 g/dL — ABNORMAL LOW (ref 6.5–8.1)

## 2023-08-19 LAB — EXPECTORATED SPUTUM ASSESSMENT W GRAM STAIN, RFLX TO RESP C

## 2023-08-19 LAB — CULTURE, BLOOD (ROUTINE X 2)
Culture: NO GROWTH
Culture: NO GROWTH

## 2023-08-19 LAB — PHOSPHORUS: Phosphorus: 3.8 mg/dL (ref 2.5–4.6)

## 2023-08-19 LAB — MAGNESIUM: Magnesium: 2.4 mg/dL (ref 1.7–2.4)

## 2023-08-19 MED ORDER — BENZONATATE 100 MG PO CAPS: 100.0000 mg | ORAL_CAPSULE | Freq: Three times a day (TID) | ORAL | Status: DC | PRN

## 2023-08-19 MED ORDER — AMLODIPINE BESYLATE 5 MG PO TABS
5.0000 mg | ORAL_TABLET | Freq: Every day | ORAL | Status: DC
  Administered 2023-08-19: 5 mg via ORAL
  Filled 2023-08-19: qty 1

## 2023-08-19 MED ORDER — DILTIAZEM HCL 30 MG PO TABS
30.0000 mg | ORAL_TABLET | Freq: Four times a day (QID) | ORAL | Status: DC
  Administered 2023-08-19 – 2023-08-20 (×3): 30 mg via ORAL
  Filled 2023-08-19 (×4): qty 1

## 2023-08-19 NOTE — Care Management Important Message (Signed)
Important Message  Patient Details  Name: [name] Douglas MRN: [id number] Date of Birth: [birth year]   Important Message Given:  Yes - Medicare IM     Cristela Blue, CMA 08/19/2023, 3:39 PM

## 2023-08-19 NOTE — Plan of Care (Signed)

## 2023-08-19 NOTE — Progress Notes (Signed)
12/30 Spoke with pts daughter Alford Highland) discussed the IM letter and received verbal acknowledgement and consent for IM letter.

## 2023-08-19 NOTE — Evaluation (Addendum)
Occupational Therapy Evaluation Patient Details Name: [name] Douglas MRN: [id number] DOB: October 15, 1940 Today's Date: 08/19/2023   History of Present Illness 82 y.o. female presented with dry cough and right-sided chest pain. MD assessment: PNA, COPD exacerbation and A-fib RVR during hospitalization. PMHx: recently diagnosed non-small cell carcinoma of RUL lung status post radiation therapy in September 2024, PAF on Pradaxa and flecainide, COPD Gold stage I, chronic hypoxic respiratory failure on 1 L continuously at home, IIDM, HTN, chronic HFpEF.   Clinical Impression   Pt was seen for PT/OT co-evaluation this date. Prior to hospital admission, pt was living in a level entry apartment on her own and MOD I/IND with ADLs and use of rollator for mobility. 1 fall in the last 2 weeks when her legs gave out at home while in the kitchen.  Pt presents to acute OT demonstrating impaired ADL performance and functional mobility 2/2 low activity tolerance, balance deficits and weakness (See OT problem list for additional functional deficits). Denies pain. Pt currently requires SUP for bed mobility, Min A for STS and Min A for SPT from bed<>BSC. Use of RW for transfer to maintain balance and Max A/total assist provided for LB dressing to doff/don clean pull-up and provided posterior hygiene after BM. Pt's sp02 on 4L throughout and requiring max cueing for PLB and pacing with lowest drop to 84% with increased time to improve. Pt has very limited activity tolerance and needs further education on energy conservation techniques. Pt would benefit from skilled OT services to address noted impairments and functional limitations (see below for any additional details) in order to maximize safety and independence while minimizing falls risk and caregiver burden. Do  anticipate the need for follow up OT services upon acute hospital DC.        If plan is discharge home, recommend the following: Assist for transportation;Help  with stairs or ramp for entrance;A lot of help with bathing/dressing/bathroom;A lot of help with walking and/or transfers    Functional Status Assessment  Patient has had a recent decline in their functional status and demonstrates the ability to make significant improvements in function in a reasonable and predictable amount of time.  Equipment Recommendations  BSC/3in1    Recommendations for Other Services       Precautions / Restrictions Precautions Precautions: Fall Precaution Comments: Pt reports no recent falls; family endorses fall that occured bc legs gave way on 12/20. Restrictions Weight Bearing Restrictions Per Provider Order: No      Mobility Bed Mobility Overal bed mobility: Needs Assistance Bed Mobility: Supine to Sit, Sit to Supine     Supine to sit: Supervision Sit to supine: Supervision   General bed mobility comments: Pt able to complete bed mobility without assistance, increased time required for completion. with cues for SOB. Sp02 on 4L was 88-89% seated EOB, cues for pursed lip breathing and rest break with improvements to low 90's. Increased time between transition required.    Transfers Overall transfer level: Needs assistance Equipment used: Rolling walker (2 wheels) Transfers: Sit to/from Stand, Bed to chair/wheelchair/BSC Sit to Stand: Min assist     Step pivot transfers: Min assist     General transfer comment: Min A for STS from EOB and for SPT bed<>BSC, cueing for hand placement to push up from bed and BSC handles; cueing for PLB/pacing provided throughout. Sp02 dropped to 84% with activity and required increased time and rest/PLB to improve      Balance Overall balance assessment: Needs assistance Sitting-balance support:  No upper extremity supported, Feet supported Sitting balance-Leahy Scale: Fair     Standing balance support: Reliant on assistive device for balance, Bilateral upper extremity supported Standing balance-Leahy Scale:  Fair Standing balance comment: MIN A from external support and use of RW for safety with transfers                           ADL either performed or assessed with clinical judgement   ADL Overall ADL's : Needs assistance/impaired                     Lower Body Dressing: Sit to/from stand;Sitting/lateral leans;Maximal assistance Lower Body Dressing Details (indicate cue type and reason): to doff/don pull-up Toilet Transfer: BSC/3in1;Minimal assistance   Toileting- Clothing Manipulation and Hygiene: Maximal assistance;Total assistance;Sit to/from stand               Vision         Perception         Praxis         Pertinent Vitals/Pain Pain Assessment Pain Assessment: No/denies pain     Extremity/Trunk Assessment Upper Extremity Assessment Upper Extremity Assessment: Overall WFL for tasks assessed   Lower Extremity Assessment Lower Extremity Assessment: Generalized weakness       Communication Communication Communication: No apparent difficulties   Cognition Arousal: Alert Behavior During Therapy: WFL for tasks assessed/performed Overall Cognitive Status: Within Functional Limits for tasks assessed                                 General Comments: Patient A&O.     General Comments  4L 02 throughout session with drop to 84% with activity requiring increased time to improve to 90's; HR low 100's during session    Exercises Other Exercises Other Exercises: Edu provided on role of OT in acute setting and importanct of PLB and ECS to use during ADL performance and mobility to prevent overexertion/increased SOB.   Shoulder Instructions      Home Living Family/patient expects to be discharged to:: Private residence Living Arrangements: Alone Available Help at Discharge: Family;Available PRN/intermittently Type of Home: Apartment Home Access: Level entry     Home Layout: One level     Bathroom Shower/Tub: Multimedia programmer: Standard Bathroom Accessibility: Yes   Home Equipment: Rollator (4 wheels);Grab bars - toilet;Tub bench   Additional Comments: Pt was recieving HHPT services prior to admission. Daughter reports patient was  on 3L supplemental oxygen at home and increased to 4L when going to grocery store, etc.      Prior Functioning/Environment Prior Level of Function : Independent/Modified Independent             Mobility Comments: Short distance ambulation in home with Rollator, very little mobility at baseline d/t SOB; rides motorized cart at grocery store ADLs Comments: IND/MOD I with ADLs and daughter assists with bathing in shower on tub bench; son and his girlfriend check on pt every night will call first to see if she's up for them to come        OT Problem List: Decreased strength;Decreased activity tolerance;Cardiopulmonary status limiting activity;Impaired balance (sitting and/or standing)      OT Treatment/Interventions: Self-care/ADL training;Therapeutic exercise;Patient/family education;Energy conservation;Therapeutic activities;DME and/or AE instruction    OT Goals(Current goals can be found in the care plan section) Acute Rehab OT Goals Patient  Stated Goal: improve strength OT Goal Formulation: With patient/family Time For Goal Achievement: 09/02/23 Potential to Achieve Goals: Fair ADL Goals Pt Will Perform Lower Body Bathing: with supervision;sit to/from stand;sitting/lateral leans;with adaptive equipment Pt Will Perform Lower Body Dressing: with supervision;with adaptive equipment;sitting/lateral leans;sit to/from stand Pt Will Transfer to Toilet: with supervision;grab bars;bedside commode;ambulating Pt Will Perform Toileting - Clothing Manipulation and hygiene: with supervision;sit to/from stand;sitting/lateral leans Additional ADL Goal #1: Pt will verbalize 1 learned ECS/PLB techniques to prevent overexertion during completion of ADLs/mobility.  OT  Frequency: Min 1X/week    Co-evaluation PT/OT/SLP Co-Evaluation/Treatment: Yes Reason for Co-Treatment: For patient/therapist safety;To address functional/ADL transfers PT goals addressed during session: Mobility/safety with mobility OT goals addressed during session: ADL's and self-care      AM-PAC OT "6 Clicks" Daily Activity     Outcome Measure Help from another person eating meals?: None Help from another person taking care of personal grooming?: None Help from another person toileting, which includes using toliet, bedpan, or urinal?: A Lot Help from another person bathing (including washing, rinsing, drying)?: A Lot Help from another person to put on and taking off regular upper body clothing?: A Little Help from another person to put on and taking off regular lower body clothing?: A Lot 6 Click Score: 17   End of Session Equipment Utilized During Treatment: Oxygen;Rolling walker (2 wheels)  Activity Tolerance: Patient limited by fatigue Patient left: with call bell/phone within reach;in bed;with bed alarm set;with family/visitor present  OT Visit Diagnosis: Other abnormalities of gait and mobility (R26.89);Other (comment) (low activity tolerance/Sp02 drop requiring 4L 02)                Time: 1191-4782 OT Time Calculation (min): 33 min Charges:  OT General Charges $OT Visit: 1 Visit OT Evaluation $OT Eval Moderate Complexity: 1 Mod Loren Sawaya, OTR/L 08/19/23, 3:43 PM Ginnie Marich E Mckinley Adelstein 08/19/2023, 3:40 PM

## 2023-08-19 NOTE — Progress Notes (Signed)
Rounding Note    Patient Name: [name] Douglas Date of Encounter: 08/19/2023  [facility] Cardiologist: None   Subjective   Patient reports persistent shortness of breath and lightheadedness with standing/walking. Although reports tolerating the reintroduction of metoprolol well. On 4 L supplemental O2. Remains in sinus rhythm with wandering atrial pacemaker.   Inpatient Medications    Scheduled Meds:  amLODipine  5 mg Oral Daily   atorvastatin  80 mg Oral Daily   dabigatran  150 mg Oral Q12H   dexamethasone  4 mg Oral Daily   donepezil  10 mg Oral QHS   doxycycline  100 mg Oral Q12H   flecainide  50 mg Oral BID   furosemide  20 mg Oral QODAY   iron polysaccharides  150 mg Oral Daily   levothyroxine  88 mcg Oral Q0600   loratadine  10 mg Oral Daily   melatonin  2.5 mg Oral QHS   metoprolol succinate  25 mg Oral QPM   mometasone-formoterol  2 puff Inhalation BID   pantoprazole  40 mg Oral Daily   valACYclovir  1,000 mg Oral TID   venlafaxine XR  150 mg Oral Q breakfast   Continuous Infusions:  ampicillin-sulbactam (UNASYN) IV 3 g (08/19/23 0326)   PRN Meds: guaiFENesin-dextromethorphan, HYDROmorphone (DILAUDID) injection, ipratropium-albuterol, ondansetron **OR** ondansetron (ZOFRAN) IV   Vital Signs    Vitals:   08/18/23 1716 08/18/23 2017 08/19/23 0350 08/19/23 0500  BP: (!) 133/56 136/60 (!) 130/58   Pulse: 87 72 75   Resp: 18 18 18    Temp:  97.9 F (36.6 C) 98.7 F (37.1 C)   TempSrc:  Oral Oral   SpO2: 95% 97% 93% 98%    Intake/Output Summary (Last 24 hours) at 08/19/2023 0909 Last data filed at 08/19/2023 0017 Gross per 24 hour  Intake 1140 ml  Output 900 ml  Net 240 ml      08/09/2023   11:34 PM 07/15/2023    9:53 AM 06/19/2023    2:17 PM  Last 3 Weights  Weight (lbs) 200 lb 200 lb 2.8 oz --  Weight (kg) 90.719 kg 90.8 kg --      Telemetry    Sinus rhythm with PACs and wandering atrial pacemaker, rate 70-90 bpm - Personally  Reviewed  Physical Exam   GEN: No acute distress.   Neck: No JVD Cardiac: Irregular rhythm, regular rate, no murmurs, rubs, or gallops.  Respiratory: Dyspnea with speaking and coarse breath sounds bilaterally, on 4 L supplemental O2 GI: Soft, nontender, non-distended  MS: No edema; No deformity. Neuro:  Nonfocal  Psych: Normal affect   Labs    High Sensitivity Troponin:   Recent Labs  Lab 08/14/23 0630 08/14/23 0928  TROPONINIHS 34* 36*     Chemistry Recent Labs  Lab 08/17/23 0710 08/18/23 0557 08/19/23 0413  NA 141 140 137  K 4.0 4.0 4.2  CL 103 101 99  CO2 28 28 26   GLUCOSE 210* 151* 155*  BUN 30* 24* 33*  CREATININE 0.88 0.77 1.01*  CALCIUM 8.8* 8.7* 8.7*  MG 2.0 2.1 2.4  PROT 5.8* 5.6* 6.2*  ALBUMIN 2.9* 2.9* 3.0*  AST 23 23 27   ALT 31 33 35  ALKPHOS 77 78 88  BILITOT 0.9 1.2* 1.0  GFRNONAA >60 >60 55*  ANIONGAP 10 11 12     Lipids No results for input(s): "CHOL", "TRIG", "HDL", "LABVLDL", "LDLCALC", "CHOLHDL" in the last 168 hours.  Hematology Recent Labs  Lab 08/17/23 0710  08/18/23 0557 08/19/23 0413  WBC 10.0 10.0 11.4*  RBC 3.57* 3.69* 3.78*  HGB 10.2* 10.7* 11.0*  HCT 31.3* 32.8* 33.4*  MCV 87.7 88.9 88.4  MCH 28.6 29.0 29.1  MCHC 32.6 32.6 32.9  RDW 14.2 14.2 14.1  PLT 183 162 203   Thyroid No results for input(s): "TSH", "FREET4" in the last 168 hours.  BNP Recent Labs  Lab 08/14/23 0630  BNP 168.2*    DDimer  Recent Labs  Lab 08/14/23 0630  DDIMER 1.63*     Radiology    No results found.  Cardiac Studies   01/02/2023 TTE  1. Left ventricular ejection fraction, by estimation, is 60 to 65%. The  left ventricle has normal function. The left ventricle has no regional  wall motion abnormalities. There is mild left ventricular hypertrophy.  Left ventricular diastolic parameters  are consistent with Grade II diastolic dysfunction (pseudonormalization).   2. Right ventricular systolic function is normal. The right ventricular   size is normal. There is moderately elevated pulmonary artery systolic  pressure.   3. Left atrial size was moderately dilated.   4. The mitral valve is degenerative. Mild mitral valve regurgitation.   5. Tricuspid valve regurgitation is mild to moderate.   6. The aortic valve is tricuspid. Aortic valve regurgitation is mild.  Aortic valve sclerosis is present, with no evidence of aortic valve  stenosis.   7. The inferior vena cava is normal in size with greater than 50%  respiratory variability, suggesting right atrial pressure of 3 mmHg.   Patient Profile     AMELI BOECKER is a 82 y.o. female with a hx of bronchogenic lung carcinoma status post XRT, COPD on 1-2 L, HFpEF, CVA, TIA, SVT, and PAF who is being seen for the continued evaluation of tachycardia.  Assessment & Plan    Multifocal atrial tachycardia Supraventricular tachycardia Paroxysmal atrial fibrillation - Multiple episodes of SVT and persistent MAT in the setting of COPD exacerbation. Was previously on a beta blocker but has been without it for several months due to concern for orthostatic hypotension causing falls. - Currently in sinus rhythm with wandering atrial pacemaker, rate 70-90 bpm - Continue flecainide 50 mg BID and dabigatran 150 mg - Restarted on 25 mg metoprolol succinate 12/28 (was taking 50 mg chronically) - In the setting of acute respiratory failure, could consider d/c beta blocker and amlodipine to replace with diltiazem - Recommend close follow up with established EP as outpatient  Hypertension Orthostatic hypotension - BP has been stable with systolic 130-140s - Check orthostatic vitals - Restarted on 25 mg metoprolol succinate 12/28 (was taking 50 mg chronically) - Continue amlodipine 5 mg daily (reduced from 10 mg yesterday) with low threshold to discontinue if orthostatic hypotension persists  Acute on chronic hypoxic respiratory failure Acute COPD exacerbation - Right lower lobe pneumonia  and COPD exacerbation - Remains on 3 L supplemental O2 at rest. Previously established home O2.  - Management per IM  Coronary calcification Aortic atherosclerosis - Continue atorvastatin 80 mg  For questions or updates, please contact [facility] Please consult [email or web address] for contact info under        Signed, Orion Crook, PA-C  08/19/2023, 9:09 AM

## 2023-08-19 NOTE — TOC Progression Note (Signed)
Transition of Care [facility]) - Progression Note    Patient Details  Name: [name] Douglas MRN: [id number] Date of Birth: 1940-08-26  [facility] CM/SW Contact  Margarito Liner, LCSW Phone Number: 08/19/2023, 2:21 PM  Clinical Narrative:  CSW called and spoke to daughter Carollee Herter. Family is considering hospice services rather than SNF for rehab. PT/OT evaluations are pending. CSW asked attending to consult the palliative team.   Expected Discharge Plan: Home w Home Health Services Barriers to Discharge: Continued Medical Work up  Expected Discharge Plan and Services       Living arrangements for the past 2 months: Single Family Home                                       Social Determinants of Health (SDOH) Interventions SDOH Screenings   Food Insecurity: No Food Insecurity (08/14/2023)  Housing: Low Risk  (08/14/2023)  Transportation Needs: No Transportation Needs (08/14/2023)  Utilities: Not At Risk (08/14/2023)  Alcohol Screen: Low Risk  (08/08/2020)  Depression (PHQ2-9): Low Risk  (04/10/2021)  Financial Resource Strain: Low Risk  (06/03/2023)   Received from [facility]  Tobacco Use: Medium Risk (08/14/2023)    Readmission Risk Interventions    07/17/2023   11:52 AM  Readmission Risk Prevention Plan  Transportation Screening Complete  PCP or Specialist Appt within 3-5 Days Complete  HRI or Home Care Consult Complete  Social Work Consult for Recovery Care Planning/Counseling Complete  Palliative Care Screening Not Applicable  Medication Review Oceanographer) Complete

## 2023-08-19 NOTE — Telephone Encounter (Signed)
Per Lasandra Beech, RN, radiation therapy nurses will schedule patient for PET scan after patient is discharged

## 2023-08-19 NOTE — Progress Notes (Signed)
PROGRESS NOTE    [name] Douglas  UJW:[id number] DOB: 1941/01/31 DOA: 08/14/2023 PCP: Mick Sell, MD  Chief Complaint  Patient presents with   Shortness of Breath    Pt came from home with history of Lung CA with radiation treatments completed and COPD. Pt came to ED with nonproductive spontaneous cough and SOB. Pt reports wearing home O2 at 1-2L [place] but is requiring more oxygen at present with current need being 3L oxygen [place].     Hospital Course:  [name] Douglas is 82 y.o. female with recently diagnosed non-small cell carcinoma right upper lung, status post radiation therapy in 04/2023, paroxysmal A-fib on Pradaxa and flecainide, COPD, chronic hypoxic respiratory failure on 3-4 L continuously, hypertension, chronic heart failure with preserved EF, who presents with dry cough and right-sided chest pain. Patient endorses that she frequently feels that the food is stuck in her chest, but denies any coughing or choking after eating.  On arrival patient was afebrile, mildly tachycardic to 106.  WBC 11.6, CTA negative for PE but noted right lung nodular changes in the right upper lung and findings consistent with right lower lobe aspiration pneumonia.  She was started on abx and admitted for further work up.  Subjective: No acute events overnight. On evaluation today patient has worsening cough.  Tachypnea this morning, continued dyspnea on exertion.   Objective: Vitals:   08/18/23 1716 08/18/23 2017 08/19/23 0350 08/19/23 0500  BP: (!) 133/56 136/60 (!) 130/58   Pulse: 87 72 75   Resp: 18 18 18    Temp:  97.9 F (36.6 C) 98.7 F (37.1 C)   TempSrc:  Oral Oral   SpO2: 95% 97% 93% 98%    Intake/Output Summary (Last 24 hours) at 08/19/2023 0754 Last data filed at 08/19/2023 0017 Gross per 24 hour  Intake 1140 ml  Output 900 ml  Net 240 ml    There were no vitals filed for this visit.  Examination: General exam: No acute distress, calm, nontoxic-appearing  respiratory  system: symmetric chest rise, no work of breathing appreciated, productive cough, dyspnea when speaking Cardiovascular system: S1 & S2 heard, RRR, irregular rhythm.  Gastrointestinal system: Abdomen is nondistended, soft and nontender.  Neuro: Alert and oriented. No focal neurological deficits. Extremities: Symmetric, expected ROM Psychiatry: Demonstrates appropriate judgement and insight. Mood & affect appropriate for situation.   Assessment & Plan:  Principal Problem:   PNA (pneumonia) Active Problems:   COPD with acute exacerbation (HCC)   Chest pain   CAP (community acquired pneumonia)   Aspiration pneumonia (HCC)   Pneumonia   Multifocal atrial tachycardia (HCC)     Paroxysmal A-fib, intermittent RVR - Rapid response called 12/28 for A-fib RVR, resolved with IV metoprolol.  Provoked by exertion and albuterol - Cardiology reengaged - Metoprolol added 12/28.   -Patient does appear to have orthostasis.  Will decrease amlodipine. - Patient needs to follow-up with her outpatient established cardiologist regarding flecainide dosing in the future -- She does also have history of SVT responsive to adenosine - Continue flecainide and Pradaxa. Cards considering diltiazem, appreciate recs.  Acute on chronic hypoxic respiratory failure, at baseline now - Secondary to right lower lobe pneumonia and COPD exacerbation - Currently on Unasyn and doxycycline to cover for aspiration and atypical pneumonias. Transition to Augmentin outpt. - Legionella and mycoplasma neg -flu/RSV/COVID/respiratory panel negative - Breathing treatments RPN -- Baseline oxygen requirement: 3 L at rest, 4L when mobile.  Previously established home O2 - Continue with Mucinex  DM as needed - Will add Tessalon Perles for coughing  Acute COPD exacerbation - Patient had recent COPD exacerbation and was started on long tapering Decadron with end date 1/16.  Patient is now status post IV Solu-Medrol x 1. - Will  continue p.o. Decadron and treat pneumonia as above - Continue with LABA every 6 hours.  DuoNebs as needed - Incentive spirometry and flutter valve  Shingles - New dermatomal right-sided flank rash.  No obvious vesicular lesions, however given unilaterality and dermatomal distribution, combined with patient age and immunocompromised state, shingles is likely.  Have initiated on valacyclovir. - Continue valacyclovir for at least 7 days, consider a longer course up to 14days given active lung cancer.  Sepsis - No evidence of acute endorgan damage - Criteria: Hypoxemia, tachycardia, lactic acidosis, aspiration pneumonia source - Management as above  Chest pain, resolved - Multifactorial.  In some part may be related to possible shingles which is just now presenting with rash.  Also Likely component of pleuritis secondary to right-sided lung cancer with metastatic lesions as well as new onset pneumonia. - CTA negative for PE - Troponin negative x 2  Globus Sensation - There was concern for aspiration, speech therapy consulted.  No report of oropharyngeal phase dysphagia during their assessment.  No clinical signs or symptoms of aspiration. - Reportedly patient has baseline esophageal phase dismotility.  Has outpatient follow-up with GI for this. - Cont daily PPI for GERD  Right upper lung non-small cell lung cancer - Chest CT 12/25 reveals progression of right upper lung nodules as well as enlarged right hilar nodules.  Patient was made aware of these results.  She is following with oncology next week for PET  Recent rectal bleeding - Prior ED visit 5 days prior to arrival for acute rectal bleeding, stopped same night and no further episodes - Hemoglobin stable at this time - Follow-up outpatient GI  Hypertension - Blood pressure management as above  DVT prophylaxis: Pradaxa   Code Status: Full Code Family Communication: Discussed with daughter [name] Douglas and son [name] Douglas today at bedside.   At later time they have requested palliative care consultation.  We have placed this consult, appreciate the assistance of the palliative care team in our ongoing GOC discussions. Disposition:  Status is: Medically stable for discharge, she appears to be at her baseline now.  TOC consult for SNF placement per family request, now considering hospice as well   Antimicrobials:  Anti-infectives (From admission, onward)    Start     Dose/Rate Route Frequency Ordered Stop   08/18/23 0000  valACYclovir (VALTREX) 1000 MG tablet        1,000 mg Oral 3 times daily 08/18/23 1251 08/25/23 2359   08/18/23 0000  amoxicillin-clavulanate (AUGMENTIN) 875-125 MG tablet        1 tablet Oral 2 times daily 08/18/23 1251 08/23/23 2359   08/16/23 1000  valACYclovir (VALTREX) tablet 1,000 mg        1,000 mg Oral 3 times daily 08/16/23 0537     08/15/23 1000  cefTRIAXone (ROCEPHIN) 2 g in sodium chloride 0.9 % 100 mL IVPB  Status:  Discontinued        2 g 200 mL/hr over 30 Minutes Intravenous Every 24 hours 08/14/23 1004 08/14/23 1005   08/15/23 1000  azithromycin (ZITHROMAX) tablet 500 mg  Status:  Discontinued        500 mg Oral Daily 08/14/23 1004 08/14/23 1004   08/15/23 1000  doxycycline (VIBRA-TABS) tablet 100  mg        100 mg Oral Every 12 hours 08/14/23 1004     08/14/23 1200  Ampicillin-Sulbactam (UNASYN) 3 g in sodium chloride 0.9 % 100 mL IVPB        3 g 200 mL/hr over 30 Minutes Intravenous Every 6 hours 08/14/23 1037     08/14/23 0845  cefTRIAXone (ROCEPHIN) 1 g in sodium chloride 0.9 % 100 mL IVPB        1 g 200 mL/hr over 30 Minutes Intravenous  Once 08/14/23 0843 08/14/23 0932   08/14/23 0845  azithromycin (ZITHROMAX) 500 mg in sodium chloride 0.9 % 250 mL IVPB        500 mg 250 mL/hr over 60 Minutes Intravenous  Once 08/14/23 0843 08/14/23 1054       Data Reviewed: I have personally reviewed following labs and imaging studies CBC: Recent Labs  Lab 08/14/23 0630 08/15/23 0452  08/16/23 0434 08/17/23 0710 08/18/23 0557 08/19/23 0413  WBC 11.6* 12.6* 11.6* 10.0 10.0 11.4*  NEUTROABS 9.9*  --  10.3* 9.0* 8.6* 9.5*  HGB 11.8* 11.2* 10.9* 10.2* 10.7* 11.0*  HCT 34.4* 35.0* 33.7* 31.3* 32.8* 33.4*  MCV 89.1 90.7 89.2 87.7 88.9 88.4  PLT 273 203 190 183 162 203   Basic Metabolic Panel: Recent Labs  Lab 08/14/23 0630 08/16/23 0434 08/17/23 0710 08/18/23 0557 08/19/23 0413  NA 137 138 141 140 137  K 4.1 4.2 4.0 4.0 4.2  CL 98 97* 103 101 99  CO2 28 28 28 28 26   GLUCOSE 124* 182* 210* 151* 155*  BUN 32* 30* 30* 24* 33*  CREATININE 1.04* 0.90 0.88 0.77 1.01*  CALCIUM 8.8* 9.2 8.8* 8.7* 8.7*  MG  --  2.0 2.0 2.1 2.4  PHOS  --  3.4 3.6 2.9 3.8   GFR: Estimated Creatinine Clearance: 46 mL/min (A) (by C-G formula based on SCr of 1.01 mg/dL (H)). Liver Function Tests: Recent Labs  Lab 08/14/23 0630 08/16/23 0434 08/17/23 0710 08/18/23 0557 08/19/23 0413  AST 25 23 23 23 27   ALT 31 31 31  33 35  ALKPHOS 73 78 77 78 88  BILITOT 0.7 0.7 0.9 1.2* 1.0  PROT 6.4* 6.0* 5.8* 5.6* 6.2*  ALBUMIN 3.3* 3.2* 2.9* 2.9* 3.0*   CBG: Recent Labs  Lab 08/17/23 0941  GLUCAP 228*    Recent Results (from the past 240 hours)  Blood culture (routine x 2)     Status: None   Collection Time: 08/14/23  6:30 AM   Specimen: BLOOD RIGHT ARM  Result Value Ref Range Status   Specimen Description BLOOD RIGHT ARM  Final   Special Requests   Final    BOTTLES DRAWN AEROBIC AND ANAEROBIC Blood Culture results may not be optimal due to an inadequate volume of blood received in culture bottles   Culture   Final    NO GROWTH 5 DAYS Performed at [facility], [address] [place], [place] [address]    Report Status 08/19/2023 FINAL  Final  Blood culture (routine x 2)     Status: None   Collection Time: 08/14/23  6:31 AM   Specimen: BLOOD  Result Value Ref Range Status   Specimen Description BLOOD LEFT AC  Final   Special Requests   Final    BOTTLES DRAWN  AEROBIC AND ANAEROBIC Blood Culture results may not be optimal due to an inadequate volume of blood received in culture bottles   Culture   Final  NO GROWTH 5 DAYS Performed at [facility], [address] [place], [place] [address]    Report Status 08/19/2023 FINAL  Final  Resp panel by RT-PCR (RSV, Flu A&B, Covid) Anterior Nasal Swab     Status: None   Collection Time: 08/14/23  6:32 AM   Specimen: Anterior Nasal Swab  Result Value Ref Range Status   SARS Coronavirus 2 by RT PCR NEGATIVE NEGATIVE Final    Comment: (NOTE) SARS-CoV-2 target nucleic acids are NOT DETECTED.  The SARS-CoV-2 RNA is generally detectable in upper respiratory specimens during the acute phase of infection. The lowest concentration of SARS-CoV-2 viral copies this assay can detect is 138 copies/mL. A negative result does not preclude SARS-Cov-2 infection and should not be used as the sole basis for treatment or other patient management decisions. A negative result may occur with  improper specimen collection/handling, submission of specimen other than nasopharyngeal swab, presence of viral mutation(s) within the areas targeted by this assay, and inadequate number of viral copies(<138 copies/mL). A negative result must be combined with clinical observations, patient history, and epidemiological information. The expected result is Negative.  Fact Sheet for Patients:  BloggerCourse.com  Fact Sheet for Healthcare Providers:  SeriousBroker.it  This test is no t yet approved or cleared by the [place] FDA and  has been authorized for detection and/or diagnosis of SARS-CoV-2 by FDA under an Emergency Use Authorization (EUA). This EUA will remain  in effect (meaning this test can be used) for the duration of the COVID-19 declaration under Section 564(b)(1) of the Act, 21 U.S.C.section 360bbb-3(b)(1), unless the authorization is terminated  or  revoked sooner.       Influenza A by PCR NEGATIVE NEGATIVE Final   Influenza B by PCR NEGATIVE NEGATIVE Final    Comment: (NOTE) The Xpert Xpress SARS-CoV-2/FLU/RSV plus assay is intended as an aid in the diagnosis of influenza from Nasopharyngeal swab specimens and should not be used as a sole basis for treatment. Nasal washings and aspirates are unacceptable for Xpert Xpress SARS-CoV-2/FLU/RSV testing.  Fact Sheet for Patients: BloggerCourse.com  Fact Sheet for Healthcare Providers: SeriousBroker.it  This test is not yet approved or cleared by the [place] FDA and has been authorized for detection and/or diagnosis of SARS-CoV-2 by FDA under an Emergency Use Authorization (EUA). This EUA will remain in effect (meaning this test can be used) for the duration of the COVID-19 declaration under Section 564(b)(1) of the Act, 21 U.S.C. section 360bbb-3(b)(1), unless the authorization is terminated or revoked.     Resp Syncytial Virus by PCR NEGATIVE NEGATIVE Final    Comment: (NOTE) Fact Sheet for Patients: BloggerCourse.com  Fact Sheet for Healthcare Providers: SeriousBroker.it  This test is not yet approved or cleared by the [place] FDA and has been authorized for detection and/or diagnosis of SARS-CoV-2 by FDA under an Emergency Use Authorization (EUA). This EUA will remain in effect (meaning this test can be used) for the duration of the COVID-19 declaration under Section 564(b)(1) of the Act, 21 U.S.C. section 360bbb-3(b)(1), unless the authorization is terminated or revoked.  Performed at [facility], [address], [place], [place] [address]      Radiology Studies: No results found.   Scheduled Meds:  amLODipine  5 mg Oral Daily   atorvastatin  80 mg Oral Daily   dabigatran  150 mg Oral Q12H   dexamethasone  4 mg Oral Daily   donepezil   10 mg Oral QHS   doxycycline  100 mg Oral Q12H   flecainide  50 mg Oral BID   furosemide  20 mg Oral QODAY   iron polysaccharides  150 mg Oral Daily   levothyroxine  88 mcg Oral Q0600   loratadine  10 mg Oral Daily   melatonin  2.5 mg Oral QHS   metoprolol succinate  25 mg Oral QPM   mometasone-formoterol  2 puff Inhalation BID   pantoprazole  40 mg Oral Daily   valACYclovir  1,000 mg Oral TID   venlafaxine XR  150 mg Oral Q breakfast   Continuous Infusions:  ampicillin-sulbactam (UNASYN) IV 3 g (08/19/23 0326)     LOS: 4 days    Time spent:   Debarah Crape, DO [facility]  To contact the attending physician between 7A-7P please use Epic Chat. To contact the covering physician during after hours 7P-7A, please review Amion.   08/19/2023, 7:54 AM   *This document has been created with the assistance of dictation software. Please excuse typographical errors. *

## 2023-08-19 NOTE — Progress Notes (Signed)
Physical Therapy Evaluation Patient Details Name: [name] Douglas MRN: [id number] DOB: June 11, 1941 Today's Date: 08/19/2023  History of Present Illness  82 y.o. female presented with dry cough and right-sided chest pain. MD assessment: PNA, COPD exacerbation and A-fib RVR during hospitalization. PMHx: recently diagnosed non-small cell carcinoma of RUL lung status post radiation therapy in September 2024, PAF on Pradaxa and flecainide, COPD Gold stage I, chronic hypoxic respiratory failure on 1 L continuously at home, IIDM, HTN, chronic HFpEF.   Clinical Impression  Orders Received. Chart Reviewed. Patient supine in bed upon arrival, with family at bedside. Patient agreeable to PT/OT evaluation this date. Prior to admission, patient lives alone with use of rollator for short distance ambulation. Ambulation limited to short distance d/t SOB. Patient has intermittent assist from family for ADL's per chart review. Family endorses recent fall. Patient was on 3L supplemental oxygen at home at baseline, per family reports.   On evaluation, patient was on 4L supplemental oxygen. Patient requiring supervision for bed mobility, with increased time required due to SOB, patient desat to mid to high 80's with mobility. Patient able to stand with Min A and RW with limited standing tolerance due to SOB/fatigue. Min A for stand step transfer with RW from bed <> bedside commode, with patient having bowel movement. Total A for posterior pericare. Patient unable to complete further ambulation distance due to fatigue/SOB, request to return to bed. Sp02 improved with extended seated rest break and cues for breathing technique. Patient left with all needs in reach, bed alarm set and family members remain at bedside. Patient will benefit from skilled acute PT services to address functional impairments (see below for additional) and maximize functional mobility. Anticipate the need for follow up PT services upon acute hospital  discharge. Will continue to follow acutely.       If plan is discharge home, recommend the following: A little help with walking and/or transfers;A little help with bathing/dressing/bathroom;Assistance with cooking/housework;Help with stairs or ramp for entrance;Assist for transportation   Can travel by private vehicle        Equipment Recommendations None recommended by PT  Recommendations for Other Services       Functional Status Assessment Patient has had a recent decline in their functional status and demonstrates the ability to make significant improvements in function in a reasonable and predictable amount of time.     Precautions / Restrictions Precautions Precautions: Fall Precaution Comments: Pt reports no recent falls; family endorses fall that occured bc legs gave way on 12/20. Restrictions Weight Bearing Restrictions Per Provider Order: No      Mobility  Bed Mobility Overal bed mobility: Needs Assistance Bed Mobility: Supine to Sit, Sit to Supine     Supine to sit: Supervision Sit to supine: Supervision   General bed mobility comments: Pt able to complete bed mobility without assistance, increased time required for completion. with cues for SOB. Sp02 on 4L was 88-89% seated EOB, cues for pursed lip breathing and rest break with improvements to low 90's. Increased time between transition required.    Transfers Overall transfer level: Needs assistance Equipment used: Rolling walker (2 wheels) Transfers: Sit to/from Stand, Bed to chair/wheelchair/BSC Sit to Stand: Min assist   Step pivot transfers: Min assist       General transfer comment: Pt able to stand from EOB and bedside commode with Min A, cues for hand placement. Patient able to stand step transfer with use of RW, min A.No buckling noted, poor forward flexed  posture. Continue to require cues for breathing techniques throughout all mobility due to patient desat. Sp02 improved to baseline with  breathing and extended seated rest break. With standing, HR ranging from 105-110 bpm.    Ambulation/Gait               General Gait Details: Pt able to take some anterior/lateral steps from bedside commode <> bed; declined additional ambulation distance due to SOB and fatigue.  Stairs            Wheelchair Mobility     Tilt Bed    Modified Rankin (Stroke Patients Only)       Balance                                             Pertinent Vitals/Pain Pain Assessment Pain Assessment: No/denies pain    Home Living Family/patient expects to be discharged to:: Private residence Living Arrangements: Alone Available Help at Discharge: Family;Available PRN/intermittently Type of Home: Apartment Home Access: Level entry       Home Layout: One level Home Equipment: Rollator (4 wheels);Grab bars - toilet;Tub bench Additional Comments: Pt was recieving HHPT services prior to admission. Daughter reports patient was  on 3L supplemental oxygen at home.    Prior Function Prior Level of Function : Independent/Modified Independent             Mobility Comments: Short distance ambulation in home with Rollator, very little mobility at baseline d/t SOB; rides motorized cart at grocery store       Extremity/Trunk Assessment   Upper Extremity Assessment Upper Extremity Assessment: Defer to OT evaluation    Lower Extremity Assessment Lower Extremity Assessment: Generalized weakness       Communication   Communication Communication: No apparent difficulties  Cognition Arousal: Alert Behavior During Therapy: WFL for tasks assessed/performed Overall Cognitive Status: Within Functional Limits for tasks assessed                                 General Comments: Patient A&O.        General Comments      Exercises Other Exercises Other Exercises: Pt with bowel movement, patient standing tolerance for approx 1-2 minutes with  posterior pericare. Sp02 desat to 87-88% with standing.   Assessment/Plan    PT Assessment Patient needs continued PT services  PT Problem List Decreased strength;Decreased activity tolerance;Decreased mobility;Decreased balance;Cardiopulmonary status limiting activity;Decreased knowledge of use of DME       PT Treatment Interventions DME instruction;Gait training;Functional mobility training;Therapeutic activities;Therapeutic exercise;Balance training;Neuromuscular re-education;Patient/family education    PT Goals (Current goals can be found in the Care Plan section)  Acute Rehab PT Goals Patient Stated Goal: to go home PT Goal Formulation: With patient Time For Goal Achievement: 09/02/23 Potential to Achieve Goals: Fair    Frequency Min 1X/week     Co-evaluation PT/OT/SLP Co-Evaluation/Treatment: Yes Reason for Co-Treatment: For patient/therapist safety;To address functional/ADL transfers PT goals addressed during session: Mobility/safety with mobility OT goals addressed during session: ADL's and self-care       AM-PAC PT "6 Clicks" Mobility  Outcome Measure Help needed turning from your back to your side while in a flat bed without using bedrails?: None Help needed moving from lying on your back to sitting on the side of a flat bed  without using bedrails?: A Little Help needed moving to and from a bed to a chair (including a wheelchair)?: A Little Help needed standing up from a chair using your arms (e.g., wheelchair or bedside chair)?: A Little Help needed to walk in hospital room?: A Lot Help needed climbing 3-5 steps with a railing? : A Lot 6 Click Score: 17    End of Session Equipment Utilized During Treatment: Oxygen (4L) Activity Tolerance: Other (comment) (Patient limited secondary to SOB) Patient left: in bed;with bed alarm set;with call bell/phone within reach;with family/visitor present Nurse Communication: Mobility status PT Visit Diagnosis: Unsteadiness on  feet (R26.81);Other abnormalities of gait and mobility (R26.89);Difficulty in walking, not elsewhere classified (R26.2);Muscle weakness (generalized) (M62.81)    Time: 1610-9604 PT Time Calculation (min) (ACUTE ONLY): 33 min   Charges:   PT Evaluation $PT Eval Moderate Complexity: 1 Mod PT Treatments $Therapeutic Activity: 8-22 mins PT General Charges $$ ACUTE PT VISIT: 1 Visit         Creed Copper Fairly, PT, DPT 08/19/23 3:30 PM

## 2023-08-20 DIAGNOSIS — Z515 Encounter for palliative care: Secondary | ICD-10-CM

## 2023-08-20 DIAGNOSIS — J189 Pneumonia, unspecified organism: Secondary | ICD-10-CM | POA: Diagnosis not present

## 2023-08-20 DIAGNOSIS — C3490 Malignant neoplasm of unspecified part of unspecified bronchus or lung: Secondary | ICD-10-CM | POA: Insufficient documentation

## 2023-08-20 LAB — COMPREHENSIVE METABOLIC PANEL
ALT: 32 U/L (ref 0–44)
AST: 24 U/L (ref 15–41)
Albumin: 2.7 g/dL — ABNORMAL LOW (ref 3.5–5.0)
Alkaline Phosphatase: 74 U/L (ref 38–126)
Anion gap: 9 (ref 5–15)
BUN: 28 mg/dL — ABNORMAL HIGH (ref 8–23)
CO2: 28 mmol/L (ref 22–32)
Calcium: 8.3 mg/dL — ABNORMAL LOW (ref 8.9–10.3)
Chloride: 102 mmol/L (ref 98–111)
Creatinine, Ser: 0.92 mg/dL (ref 0.44–1.00)
GFR, Estimated: >60 mL/min (ref >60)
Glucose, Bld: 124 mg/dL — ABNORMAL HIGH (ref 70–99)
Potassium: 3.6 mmol/L (ref 3.5–5.1)
Sodium: 139 mmol/L (ref 135–145)
Total Bilirubin: 0.9 mg/dL (ref 0.0–1.2)
Total Protein: 5.5 g/dL — ABNORMAL LOW (ref 6.5–8.1)

## 2023-08-20 LAB — CBC WITH DIFFERENTIAL/PLATELET
Abs Immature Granulocytes: 0.22 10*3/uL — ABNORMAL HIGH (ref 0.00–0.07)
Basophils Absolute: 0 10*3/uL (ref 0.0–0.1)
Basophils Relative: 0 %
Eosinophils Absolute: 0.1 10*3/uL (ref 0.0–0.5)
Eosinophils Relative: 1 %
HCT: 30.4 % — ABNORMAL LOW (ref 36.0–46.0)
Hemoglobin: 10.1 g/dL — ABNORMAL LOW (ref 12.0–15.0)
Immature Granulocytes: 3 %
Lymphocytes Relative: 10 %
Lymphs Abs: 0.8 10*3/uL (ref 0.7–4.0)
MCH: 28.7 pg (ref 26.0–34.0)
MCHC: 33.2 g/dL (ref 30.0–36.0)
MCV: 86.4 fL (ref 80.0–100.0)
Monocytes Absolute: 0.4 10*3/uL (ref 0.1–1.0)
Monocytes Relative: 5 %
Neutro Abs: 6.4 10*3/uL (ref 1.7–7.7)
Neutrophils Relative %: 81 %
Platelets: 165 10*3/uL (ref 150–400)
RBC: 3.52 MIL/uL — ABNORMAL LOW (ref 3.87–5.11)
RDW: 14.2 % (ref 11.5–15.5)
WBC: 8 10*3/uL (ref 4.0–10.5)
nRBC: 0 % (ref 0.0–0.2)

## 2023-08-20 LAB — PHOSPHORUS: Phosphorus: 3.2 mg/dL (ref 2.5–4.6)

## 2023-08-20 LAB — MAGNESIUM: Magnesium: 2.2 mg/dL (ref 1.7–2.4)

## 2023-08-20 MED ORDER — SENNA 8.6 MG PO TABS: 1.0000 | ORAL_TABLET | Freq: Every evening | ORAL | Status: DC | PRN

## 2023-08-20 MED ORDER — DIPHENHYDRAMINE HCL 50 MG/ML IJ SOLN: 12.5000 mg | INTRAMUSCULAR | Status: DC | PRN

## 2023-08-20 MED ORDER — LORAZEPAM 2 MG/ML IJ SOLN: 1.0000 mg | INTRAMUSCULAR | Status: DC | PRN

## 2023-08-20 MED ORDER — MORPHINE SULFATE (CONCENTRATE) 10 MG /0.5 ML PO SOLN: 5.0000 mg | ORAL | Status: DC | PRN

## 2023-08-20 MED ORDER — TEMAZEPAM 7.5 MG PO CAPS
7.5000 mg | ORAL_CAPSULE | Freq: Every evening | ORAL | Status: DC | PRN
Start: 2023-08-20 — End: 2023-08-21

## 2023-08-20 MED ORDER — SODIUM CHLORIDE 0.9% FLUSH
3.0000 mL | Freq: Two times a day (BID) | INTRAVENOUS | Status: DC
  Administered 2023-08-20 – 2023-08-21 (×3): 3 mL via INTRAVENOUS

## 2023-08-20 MED ORDER — SODIUM CHLORIDE 0.9 % IV SOLN: 250.0000 mL | INTRAVENOUS | Status: AC | PRN

## 2023-08-20 MED ORDER — LORAZEPAM 1 MG PO TABS
1.0000 mg | ORAL_TABLET | ORAL | Status: DC | PRN
Start: 2023-08-20 — End: 2023-08-21

## 2023-08-20 MED ORDER — HALOPERIDOL LACTATE 2 MG/ML PO CONC
0.5000 mg | ORAL | Status: DC | PRN
Start: 2023-08-20 — End: 2023-08-21

## 2023-08-20 MED ORDER — HALOPERIDOL 0.5 MG PO TABS
0.5000 mg | ORAL_TABLET | ORAL | Status: DC | PRN
Start: 2023-08-20 — End: 2023-08-21

## 2023-08-20 MED ORDER — ACETAMINOPHEN 650 MG RE SUPP: 650.0000 mg | Freq: Four times a day (QID) | RECTAL | Status: DC | PRN

## 2023-08-20 MED ORDER — HALOPERIDOL LACTATE 5 MG/ML IJ SOLN
0.5000 mg | INTRAMUSCULAR | Status: DC | PRN
Start: 2023-08-20 — End: 2023-08-21

## 2023-08-20 MED ORDER — LORAZEPAM 2 MG/ML PO CONC
1.0000 mg | ORAL | Status: DC | PRN
Start: 2023-08-20 — End: 2023-08-21

## 2023-08-20 MED ORDER — DILTIAZEM HCL 60 MG PO TABS
60.0000 mg | ORAL_TABLET | Freq: Four times a day (QID) | ORAL | Status: DC
  Filled 2023-08-20: qty 1

## 2023-08-20 MED ORDER — ACETAMINOPHEN 325 MG PO TABS: 650.0000 mg | ORAL_TABLET | Freq: Four times a day (QID) | ORAL | Status: DC | PRN

## 2023-08-20 MED ORDER — LOPERAMIDE HCL 2 MG PO CAPS
2.0000 mg | ORAL_CAPSULE | ORAL | Status: DC | PRN
  Administered 2023-08-20: 2 mg via ORAL
  Filled 2023-08-20: qty 1

## 2023-08-20 MED ORDER — MORPHINE SULFATE (PF) 2 MG/ML IV SOLN: 1.0000 mg | INTRAVENOUS | Status: DC | PRN

## 2023-08-20 MED ORDER — SODIUM CHLORIDE 0.9% FLUSH: 3.0000 mL | INTRAVENOUS | Status: DC | PRN

## 2023-08-20 NOTE — Progress Notes (Signed)
 PROGRESS NOTE    [name] Douglas  FMW:[id number] DOB: 02-Oct-1940 DOA: 08/14/2023 PCP: Epifanio Alm SQUIBB, MD  Chief Complaint  Patient presents with   Shortness of Breath    Pt came from home with history of Lung CA with radiation treatments completed and COPD. Pt came to ED with nonproductive spontaneous cough and SOB. Pt reports wearing home O2 at 1-2L [place] but is requiring more oxygen  at present with current need being 3L oxygen  [place].     Hospital Course:  [name] Douglas is 82 y.o. female with recently diagnosed non-small cell carcinoma right upper lung, status post radiation therapy in 04/2023, paroxysmal A-fib on Pradaxa  and flecainide , COPD, chronic hypoxic respiratory failure on 3-4 L continuously, hypertension, chronic heart failure with preserved EF, who presents with dry cough and right-sided chest pain. Patient endorses that she frequently feels that the food is stuck in her chest, but denies any coughing or choking after eating.  On arrival patient was afebrile, mildly tachycardic to 106.  WBC 11.6, CTA negative for PE but noted right lung nodular changes in the right upper lung and findings consistent with right lower lobe aspiration pneumonia.  She was started on abx and admitted for further work up.  Subjective: Breathing stable but dyspneic with minimal activity, no pain   Objective: Vitals:   08/19/23 2030 08/20/23 0248 08/20/23 0541 08/20/23 0851  BP: (!) 129/49 (!) 139/47 (!) 152/56 (!) 158/45  Pulse: 66 (!) 57 90 60  Resp: 20 20 20 18   Temp: (!) 97.4 F (36.3 C) 97.7 F (36.5 C)  97.9 F (36.6 C)  TempSrc: Oral Oral  Oral  SpO2: 95% 91% 93% (!) 89%   No intake or output data in the 24 hours ending 08/20/23 1237   There were no vitals filed for this visit.  Examination: General exam: No acute distress, calm, nontoxic-appearing  respiratory system: symmetric chest rise, no work of breathing appreciated, productive cough, dyspnea when speaking Cardiovascular  system: S1 & S2 heard, RRR, irregular rhythm.  Gastrointestinal system: Abdomen is nondistended, obese, soft and nontender.  Neuro: Alert and oriented. No focal neurological deficits. Extremities: Symmetric, expected ROM Psychiatry: Demonstrates appropriate judgement and insight. Mood & affect appropriate for situation.   Assessment & Plan:  Principal Problem:   PNA (pneumonia) Active Problems:   Essential hypertension   Hypothyroidism   COPD with acute exacerbation (HCC)   CVA (cerebral vascular accident) (HCC)   Chronic pain syndrome   Dependence on supplemental oxygen    Chest pain   PAF (paroxysmal atrial fibrillation) (HCC)   CAP (community acquired pneumonia)   Aspiration pneumonia (HCC)   Pneumonia   Multifocal atrial tachycardia (HCC)   Diabetes mellitus (HCC)   Lung cancer (HCC)     End-of-life care Lengthy advance care planning discussion with patient and daughter today. Discussed dnr and patient elects that. Discussed comfort care, treating the treatable which is what we have been doing, and more aggressive interventions. Patient is clear she wishes comfort care, daughter supports this decision. 45 minutes spend in this discussion - comfort care and dnr orders - hospice to see  Paroxysmal A-fib, intermittent RVR - Rapid response called 12/28 for A-fib RVR, resolved with IV metoprolol .  Provoked by exertion and albuterol  - Cardiology reengaged - Metoprolol  added 12/28.   -Patient does appear to have orthostasis.  -- on flecainide  Now comfort care  Acute on chronic hypoxic respiratory failure, at baseline now - Secondary to right lower lobe pneumonia and COPD  exacerbation - d/c abx given comfort care - Legionella and mycoplasma neg -flu/RSV/COVID/respiratory panel negative - Breathing treatments RPN -- Baseline oxygen  requirement: 3 L at rest, 4L when mobile.  Previously established home O2 - Continue with Mucinex  DM as needed - Will add Tessalon  Perles for  coughing  Acute COPD exacerbation - treated with steroids, now discontinued  Shingles - continue valcyclovir for comfort  Sepsis - No evidence of acute endorgan damage - Criteria: Hypoxemia, tachycardia, lactic acidosis, aspiration pneumonia source  Chest pain, resolved - Multifactorial.  In some part may be related to possible shingles which is just now presenting with rash.  Also Likely component of pleuritis secondary to right-sided lung cancer with metastatic lesions as well as new onset pneumonia. - CTA negative for PE - Troponin negative x 2  Globus Sensation - There was concern for aspiration, speech therapy consulted.  No report of oropharyngeal phase dysphagia during their assessment.  No clinical signs or symptoms of aspiration. - Reportedly patient has baseline esophageal phase dismotility.    Right upper lung non-small cell lung cancer - Chest CT 12/25 reveals progression of right upper lung nodules as well as enlarged right hilar nodules. Now elects comfort care  Recent rectal bleeding - Prior ED visit 5 days prior to arrival for acute rectal bleeding, stopped same night and no further episodes - Hemoglobin stable at this time  Hypertension  DVT prophylaxis: none   Code Status: Do not attempt resuscitation (DNR) - Comfort care Family Communication: Daughter at bedside Disposition:  home with hospice   Antimicrobials:  Anti-infectives (From admission, onward)    Start     Dose/Rate Route Frequency Ordered Stop   08/18/23 0000  valACYclovir  (VALTREX ) 1000 MG tablet        1,000 mg Oral 3 times daily 08/18/23 1251 08/25/23 2359   08/18/23 0000  amoxicillin -clavulanate (AUGMENTIN ) 875-125 MG tablet        1 tablet Oral 2 times daily 08/18/23 1251 08/23/23 2359   08/16/23 1000  valACYclovir  (VALTREX ) tablet 1,000 mg        1,000 mg Oral 3 times daily 08/16/23 0537     08/15/23 1000  cefTRIAXone  (ROCEPHIN ) 2 g in sodium chloride  0.9 % 100 mL IVPB  Status:   Discontinued        2 g 200 mL/hr over 30 Minutes Intravenous Every 24 hours 08/14/23 1004 08/14/23 1005   08/15/23 1000  azithromycin  (ZITHROMAX ) tablet 500 mg  Status:  Discontinued        500 mg Oral Daily 08/14/23 1004 08/14/23 1004   08/15/23 1000  doxycycline  (VIBRA -TABS) tablet 100 mg  Status:  Discontinued        100 mg Oral Every 12 hours 08/14/23 1004 08/20/23 1224   08/14/23 1200  Ampicillin -Sulbactam (UNASYN ) 3 g in sodium chloride  0.9 % 100 mL IVPB  Status:  Discontinued        3 g 200 mL/hr over 30 Minutes Intravenous Every 6 hours 08/14/23 1037 08/20/23 1224   08/14/23 0845  cefTRIAXone  (ROCEPHIN ) 1 g in sodium chloride  0.9 % 100 mL IVPB        1 g 200 mL/hr over 30 Minutes Intravenous  Once 08/14/23 0843 08/14/23 0932   08/14/23 0845  azithromycin  (ZITHROMAX ) 500 mg in sodium chloride  0.9 % 250 mL IVPB        500 mg 250 mL/hr over 60 Minutes Intravenous  Once 08/14/23 9156 08/14/23 1054       Data Reviewed: I  have personally reviewed following labs and imaging studies CBC: Recent Labs  Lab 08/16/23 0434 08/17/23 0710 08/18/23 0557 08/19/23 0413 08/20/23 0624  WBC 11.6* 10.0 10.0 11.4* 8.0  NEUTROABS 10.3* 9.0* 8.6* 9.5* 6.4  HGB 10.9* 10.2* 10.7* 11.0* 10.1*  HCT 33.7* 31.3* 32.8* 33.4* 30.4*  MCV 89.2 87.7 88.9 88.4 86.4  PLT 190 183 162 203 165   Basic Metabolic Panel: Recent Labs  Lab 08/16/23 0434 08/17/23 0710 08/18/23 0557 08/19/23 0413 08/20/23 0624  NA 138 141 140 137 139  K 4.2 4.0 4.0 4.2 3.6  CL 97* 103 101 99 102  CO2 28 28 28 26 28   GLUCOSE 182* 210* 151* 155* 124*  BUN 30* 30* 24* 33* 28*  CREATININE 0.90 0.88 0.77 1.01* 0.92  CALCIUM  9.2 8.8* 8.7* 8.7* 8.3*  MG 2.0 2.0 2.1 2.4 2.2  PHOS 3.4 3.6 2.9 3.8 3.2   GFR: Estimated Creatinine Clearance: 50.5 mL/min (by C-G formula based on SCr of 0.92 mg/dL). Liver Function Tests: Recent Labs  Lab 08/16/23 0434 08/17/23 0710 08/18/23 0557 08/19/23 0413 08/20/23 0624  AST 23 23 23  27 24   ALT 31 31 33 35 32  ALKPHOS 78 77 78 88 74  BILITOT 0.7 0.9 1.2* 1.0 0.9  PROT 6.0* 5.8* 5.6* 6.2* 5.5*  ALBUMIN 3.2* 2.9* 2.9* 3.0* 2.7*   CBG: Recent Labs  Lab 08/17/23 0941  GLUCAP 228*    Recent Results (from the past 240 hours)  Blood culture (routine x 2)     Status: None   Collection Time: 08/14/23  6:30 AM   Specimen: BLOOD RIGHT ARM  Result Value Ref Range Status   Specimen Description BLOOD RIGHT ARM  Final   Special Requests   Final    BOTTLES DRAWN AEROBIC AND ANAEROBIC Blood Culture results may not be optimal due to an inadequate volume of blood received in culture bottles   Culture   Final    NO GROWTH 5 DAYS Performed at [facility], [address] [place], [place] [address]    Report Status 08/19/2023 FINAL  Final  Blood culture (routine x 2)     Status: None   Collection Time: 08/14/23  6:31 AM   Specimen: BLOOD  Result Value Ref Range Status   Specimen Description BLOOD LEFT AC  Final   Special Requests   Final    BOTTLES DRAWN AEROBIC AND ANAEROBIC Blood Culture results may not be optimal due to an inadequate volume of blood received in culture bottles   Culture   Final    NO GROWTH 5 DAYS Performed at [facility], [address] [place], [place] [address]    Report Status 08/19/2023 FINAL  Final  Resp panel by RT-PCR (RSV, Flu A&B, Covid) Anterior Nasal Swab     Status: None   Collection Time: 08/14/23  6:32 AM   Specimen: Anterior Nasal Swab  Result Value Ref Range Status   SARS Coronavirus 2 by RT PCR NEGATIVE NEGATIVE Final    Comment: (NOTE) SARS-CoV-2 target nucleic acids are NOT DETECTED.  The SARS-CoV-2 RNA is generally detectable in upper respiratory specimens during the acute phase of infection. The lowest concentration of SARS-CoV-2 viral copies this assay can detect is 138 copies/mL. A negative result does not preclude SARS-Cov-2 infection and should not be used as the sole basis for treatment or other  patient management decisions. A negative result may occur with  improper specimen collection/handling, submission of specimen other than nasopharyngeal  swab, presence of viral mutation(s) within the areas targeted by this assay, and inadequate number of viral copies(<138 copies/mL). A negative result must be combined with clinical observations, patient history, and epidemiological information. The expected result is Negative.  Fact Sheet for Patients:  bloggercourse.com  Fact Sheet for Healthcare Providers:  seriousbroker.it  This test is no t yet approved or cleared by the [place]  FDA and  has been authorized for detection and/or diagnosis of SARS-CoV-2 by FDA under an Emergency Use Authorization (EUA). This EUA will remain  in effect (meaning this test can be used) for the duration of the COVID-19 declaration under Section 564(b)(1) of the Act, 21 U.S.C.section 360bbb-3(b)(1), unless the authorization is terminated  or revoked sooner.       Influenza A by PCR NEGATIVE NEGATIVE Final   Influenza B by PCR NEGATIVE NEGATIVE Final    Comment: (NOTE) The Xpert Xpress SARS-CoV-2/FLU/RSV plus assay is intended as an aid in the diagnosis of influenza from Nasopharyngeal swab specimens and should not be used as a sole basis for treatment. Nasal washings and aspirates are unacceptable for Xpert Xpress SARS-CoV-2/FLU/RSV testing.  Fact Sheet for Patients: bloggercourse.com  Fact Sheet for Healthcare Providers: seriousbroker.it  This test is not yet approved or cleared by the [place]  FDA and has been authorized for detection and/or diagnosis of SARS-CoV-2 by FDA under an Emergency Use Authorization (EUA). This EUA will remain in effect (meaning this test can be used) for the duration of the COVID-19 declaration under Section 564(b)(1) of the Act, 21 U.S.C. section  360bbb-3(b)(1), unless the authorization is terminated or revoked.     Resp Syncytial Virus by PCR NEGATIVE NEGATIVE Final    Comment: (NOTE) Fact Sheet for Patients: bloggercourse.com  Fact Sheet for Healthcare Providers: seriousbroker.it  This test is not yet approved or cleared by the [place]  FDA and has been authorized for detection and/or diagnosis of SARS-CoV-2 by FDA under an Emergency Use Authorization (EUA). This EUA will remain in effect (meaning this test can be used) for the duration of the COVID-19 declaration under Section 564(b)(1) of the Act, 21 U.S.C. section 360bbb-3(b)(1), unless the authorization is terminated or revoked.  Performed at [facility], [address] [place], [place] [address]   Expectorated Sputum Assessment w Gram Stain, Rflx to Resp Cult     Status: None   Collection Time: 08/19/23  9:25 PM   Specimen: Sputum  Result Value Ref Range Status   Specimen Description SPUTUM  Final   Special Requests Immunocompromised  Final   Sputum evaluation   Final    THIS SPECIMEN IS ACCEPTABLE FOR SPUTUM CULTURE Performed at [facility], [address], [place], [place] [address]    Report Status 08/19/2023 FINAL  Final  Culture, Respiratory w Gram Stain     Status: None (Preliminary result)   Collection Time: 08/19/23  9:25 PM   Specimen: SPU  Result Value Ref Range Status   Specimen Description   Final    SPUTUM Performed at [facility], [address], [place], [place] [address]    Special Requests   Final    Immunocompromised Reflexed from [id number] Performed at [facility], [address] [place], [place] [address]    Gram Stain   Final    FEW WBC PRESENT, PREDOMINANTLY PMN FEW GRAM POSITIVE COCCI Performed at [facility], [address] [place], [place] [address]    Culture PENDING  Incomplete   Report Status  PENDING  Incomplete      Radiology Studies: No results found.   Scheduled Meds:  melatonin  2.5 mg Oral QHS   pantoprazole   40 mg Oral Daily   sodium chloride  flush  3 mL Intravenous Q12H   valACYclovir   1,000 mg Oral TID   venlafaxine  XR  150 mg Oral Q breakfast   Continuous Infusions:  sodium chloride        LOS: 5 days    Time spent:   Devaughn KATHEE Ban, MD [facility]  To contact the attending physician between 7A-7P please use Epic Chat. To contact the covering physician during after hours 7P-7A, please review Amion.   08/20/2023, 12:37 PM

## 2023-08-20 NOTE — Progress Notes (Signed)
 [facility] Liaison Note  Received request from Lauraine Carpen, LCSW,Transitions of Care Manager, for hospice services at home after discharge.  Spoke with daughter to initiate education related to hospice philosophy, services, and team approach to care.  Daughter verbalized understanding of information given.   DME needs discussed.  Patient has the following equipment in the home: [name] Douglas and home 02.  Patient/family requests the following equipment in the home: Hospital bed, over the bed table and BSC.   The address has been verified and is correct in the chart. [name] Douglas and phone number [phone] is the family contact to arrange time of equipment delivery.    Please send signed and completed DNR home with the patient/family.  Please provide prescriptions at discharge as needed to ensure ongoing symptom management.   [facility] information and contact numbers given to the daughter.   Above information shared with Lauraine Carpen, Transitions of Care Manager.     Please call with any Hospice related questions or concerns.  Thank you for the opportunity to participate in this patient's care.  [name] Douglas, [facility] Liaison [phone]

## 2023-08-20 NOTE — Progress Notes (Signed)
                                                     Palliative Care Progress Note   Patient Name: [name]       Date: 08/20/2023 DOB: 12-02-1940  Age: 82 y.o. MRN#: [id number] Attending Physician: Kandis Devaughn Sayres, MD Primary Care Physician: Epifanio Alm SQUIBB, MD Admit Date: 08/14/2023  Referral received.  Chart reviewed.  Counseled with attending.    As per Dr. Kandis and his discussion with patient/family, patient and family have elected for full comfort measures.  No palliative needs at this time.  He advised no need for PMT to see and that [facility] will follow-up for hospice placement.  PMT remains available to patient and family throughout her hospitalization.  Please reengage with PMT if goals change, at patient/family's request, or if patient's health deteriorates during hospitalization.  Thank you for allowing the Palliative Medicine Team to assist in the care of [name] Douglas.  Lamarr L. Arvid, DNP, FNP-BC Palliative Medicine Team    No charge

## 2023-08-20 NOTE — Plan of Care (Signed)
  Problem: Education: Goal: Knowledge of General Education information will improve Description: Including pain rating scale, medication(s)/side effects and non-pharmacologic comfort measures Outcome: Progressing   Problem: Health Behavior/Discharge Planning: Goal: Ability to manage health-related needs will improve Outcome: Progressing   Problem: Clinical Measurements: Goal: Ability to maintain clinical measurements within normal limits will improve Outcome: Progressing Goal: Will remain free from infection Outcome: Progressing Goal: Diagnostic test results will improve Outcome: Progressing Goal: Respiratory complications will improve Outcome: Progressing Goal: Cardiovascular complication will be avoided Outcome: Progressing   Problem: Activity: Goal: Risk for activity intolerance will decrease Outcome: Progressing   Problem: Nutrition: Goal: Adequate nutrition will be maintained Outcome: Progressing   Problem: Coping: Goal: Level of anxiety will decrease Outcome: Progressing   Problem: Elimination: Goal: Will not experience complications related to bowel motility Outcome: Progressing Goal: Will not experience complications related to urinary retention Outcome: Progressing   Problem: Pain Management: Goal: General experience of comfort will improve Outcome: Progressing   Problem: Safety: Goal: Ability to remain free from injury will improve Outcome: Progressing   Problem: Skin Integrity: Goal: Risk for impaired skin integrity will decrease Outcome: Progressing   Problem: Education: Goal: Knowledge of the prescribed therapeutic regimen will improve Outcome: Progressing   Problem: Coping: Goal: Ability to identify and develop effective coping behavior will improve Outcome: Progressing   Problem: Clinical Measurements: Goal: Quality of life will improve Outcome: Progressing   Problem: Respiratory: Goal: Verbalizations of increased ease of respirations  will increase Outcome: Progressing   Problem: Role Relationship: Goal: Family's ability to cope with current situation will improve Outcome: Progressing Goal: Ability to verbalize concerns, feelings, and thoughts to partner or family member will improve Outcome: Progressing   Problem: Pain Management: Goal: Satisfaction with pain management regimen will improve Outcome: Progressing

## 2023-08-20 NOTE — TOC Progression Note (Signed)
 Transition of Care [facility]) - Progression Note    Patient Details  Name: [name] Douglas MRN: [id number] Date of Birth: 03/29/1941  Transition of [facility]) CM/SW Contact  Lauraine JAYSON Carpen, LCSW Phone Number: 08/20/2023, 2:13 PM  Clinical Narrative:  CSW met with patient's daughter and sister. Per MD, plan for home with hospice. CSW provided list of agencies that serve [place] and they prefer [facility]. Referral made to [facility]. Family is requesting a hospital bed, over-the-bed table, BSC, bed pan, and incontinence supplies. CSW encouraged family to start making room for DME because she will be discharged once it is delivered. Patient will need EMS transport. Daughter stated she lives in a 4th floor apartment but the elevator can fit stretchers. Daughter is aware she might receive a bill for EMS transport home.  Expected Discharge Plan: Home w Home Health Services Barriers to Discharge: Continued Medical Work up  Expected Discharge Plan and Services       Living arrangements for the past 2 months: Single Family Home                                       Social Determinants of Health (SDOH) Interventions SDOH Screenings   Food Insecurity: No Food Insecurity (08/14/2023)  Housing: Low Risk  (08/14/2023)  Transportation Needs: No Transportation Needs (08/14/2023)  Utilities: Not At Risk (08/14/2023)  Alcohol Screen: Low Risk  (08/08/2020)  Depression (PHQ2-9): Low Risk  (04/10/2021)  Financial Resource Strain: Low Risk  (06/03/2023)   Received from [facility]  Tobacco Use: Medium Risk (08/14/2023)    Readmission Risk Interventions    07/17/2023   11:52 AM  Readmission Risk Prevention Plan  Transportation Screening Complete  PCP or Specialist Appt within 3-5 Days Complete  HRI or Home Care Consult Complete  Social Work Consult for Recovery Care Planning/Counseling Complete  Palliative Care Screening Not Applicable  Medication Review Special Educational Needs Teacher) Complete

## 2023-08-21 DIAGNOSIS — J189 Pneumonia, unspecified organism: Secondary | ICD-10-CM | POA: Diagnosis not present

## 2023-08-21 MED ORDER — OXYCODONE HCL 5 MG PO TABS: 5.0000 mg | ORAL_TABLET | Freq: Three times a day (TID) | ORAL | 0 refills | Status: DC | PRN

## 2023-08-21 NOTE — Discharge Summary (Signed)
 [name] Douglas FMW:[id number] DOB: [birth year] DOA: 08/14/2023  PCP: Epifanio Alm SQUIBB, MD  Admit date: 08/14/2023 Discharge date: 08/21/2023  Time spent: 35 minutes  [name] Douglas is a 83 y.o. female with medical history significant of recently diagnosed non-small cell carcinoma of RUL lung status post radiation therapy in September 2024, PAF on Pradaxa  and flecainide , COPD Gold stage I, chronic hypoxic respiratory failure on 1 L continuously at home, IIDM, HTN, chronic HFpEF, presented with dry cough and right-sided chest pain.   Symptoms started 3 days ago patient started develop sharp like chest pain and a new cough.  Denied any phlegm production no hemoptysis this time no wheezing.  Patient denied any coughing or choking after eating or drinking but does admitted that frequently she feels  food stuck in the chest, but denied any history of aspiration pneumonia.  She described the chest pain as sharp like on the right side of the lower rib cage, worsening with cough and deep breath.  Denies any fever or chills.   Discharge Diagnoses:  Principal Problem:   PNA (pneumonia) Active Problems:   Essential hypertension   Hypothyroidism   COPD with acute exacerbation (HCC)   CVA (cerebral vascular accident) (HCC)   Chronic pain syndrome   Dependence on supplemental oxygen    Chest pain   PAF (paroxysmal atrial fibrillation) (HCC)   CAP (community acquired pneumonia)   Aspiration pneumonia (HCC)   Pneumonia   Multifocal atrial tachycardia (HCC)   Diabetes mellitus (HCC)   Lung cancer (HCC)   Discharge Condition: stable  Diet recommendation: regular  There were no vitals filed for this visit.  History of present illness:  From admission h and p  [name] Douglas is a 83 y.o. female with medical history significant of recently diagnosed non-small cell carcinoma of RUL lung status post radiation therapy in September 2024, PAF on Pradaxa  and flecainide , COPD Gold stage I, chronic  hypoxic respiratory failure on 1 L continuously at home, IIDM, HTN, chronic HFpEF, presented with dry cough and right-sided chest pain.   Symptoms started 3 days ago patient started develop sharp like chest pain and a new cough.  Denied any phlegm production no hemoptysis this time no wheezing.  Patient denied any coughing or choking after eating or drinking but does admitted that frequently she feels  food stuck in the chest, but denied any history of aspiration pneumonia.  She described the chest pain as sharp like on the right side of the lower rib cage, worsening with cough and deep breath.  Denies any fever or chills.  Hospital Course:  End-of-life care Lengthy advance care planning discussion with patient and daughter. Discussed dnr and patient elects that. Discussed comfort care, treating the treatable which is what we have been doing, and more aggressive interventions. Patient is clear she wishes comfort care, daughter supports this decision. Hospice liaison consulted, DME delivered to home, hospice intake to take place tomorrow   Paroxysmal A-fib, intermittent RVR - Rapid response called 12/28 for A-fib RVR, resolved with IV metoprolol .  Provoked by exertion and albuterol  - Cardiology reengaged - Metoprolol  added 12/28.   -Patient does appear to have orthostasis.  -- started on flecainide  Now comfort care   Acute on chronic hypoxic respiratory failure, at baseline now - Secondary to right lower lobe pneumonia and COPD exacerbation - d/c abx given comfort care - Legionella and mycoplasma neg -flu/RSV/COVID/respiratory panel negative -- can continue inhalers and o2 for comfort if she desires   Acute  COPD exacerbation - treated with steroids, now discontinued   Shingles Right upper abdomen - treated with 6 days valcyclovir - oxy for pain   Sepsis - No evidence of acute endorgan damage - Criteria: Hypoxemia, tachycardia, lactic acidosis, aspiration pneumonia source   Chest  pain, resolved - Multifactorial.  In some part may be related to possible shingles which is just now presenting with rash.  Also Likely component of pleuritis secondary to right-sided lung cancer with metastatic lesions as well as new onset pneumonia. - CTA negative for PE - Troponin negative x 2   Globus Sensation - There was concern for aspiration, speech therapy consulted.  No report of oropharyngeal phase dysphagia during their assessment.  No clinical signs or symptoms of aspiration. - Reportedly patient has baseline esophageal phase dismotility.     Right upper lung non-small cell lung cancer - Chest CT 12/25 reveals progression of right upper lung nodules as well as enlarged right hilar nodules. Now elects comfort care   Recent rectal bleeding - Prior ED visit 5 days prior to arrival for acute rectal bleeding, stopped same night and no further episodes - Hemoglobin stable at this time   Hypertension  Procedures: none   Consultations: cardiology  Discharge Exam: Vitals:   08/21/23 0431 08/21/23 0828  BP: (!) 152/50 (!) 159/67  Pulse: 68 (!) 106  Resp: 16 18  Temp: 97.8 F (36.6 C) 98.1 F (36.7 C)  SpO2: 95% 91%    General: NAD Cardiovascular: RRR Respiratory: decreased breath sounds in bases  Discharge Instructions   Discharge Instructions     Diet general   Complete by: As directed    Increase activity slowly   Complete by: As directed       Allergies as of 08/21/2023       Reactions   Trazodone  And Nefazodone Other (See Comments)   Agitation   Heparin  Nausea And Vomiting        Medication List     STOP taking these medications    Allergy Relief 10 MG tablet Generic drug: loratadine    amLODipine  10 MG tablet Commonly known as: NORVASC    atorvastatin  80 MG tablet Commonly known as: LIPITOR    budesonide  0.25 MG/2ML nebulizer solution Commonly known as: PULMICORT    dabigatran  150 MG Caps capsule Commonly known as: PRADAXA     dexamethasone  4 MG tablet Commonly known as: DECADRON    donepezil  10 MG tablet Commonly known as: ARICEPT    flecainide  50 MG tablet Commonly known as: TAMBOCOR    furosemide  20 MG tablet Commonly known as: LASIX    iron  polysaccharides 150 MG capsule Commonly known as: NIFEREX   levothyroxine  88 MCG tablet Commonly known as: SYNTHROID    sulfamethoxazole-trimethoprim 400-80 MG tablet Commonly known as: BACTRIM   vitamin B-12 500 MCG tablet Commonly known as: CYANOCOBALAMIN    vitamin C  250 MG tablet Commonly known as: ASCORBIC ACID    Vitamin D  (Ergocalciferol ) 1.25 MG (50000 UNIT) Caps capsule Commonly known as: DRISDOL        TAKE these medications    albuterol  108 (90 Base) MCG/ACT inhaler Commonly known as: VENTOLIN  HFA Inhale 2 puffs into the lungs every 4 (four) hours as needed.   Arnuity Ellipta  50 MCG/ACT Aepb Generic drug: Fluticasone  Furoate Inhale 1 puff into the lungs 2 (two) times daily.   diphenhydramine -acetaminophen  25-500 MG Tabs tablet Commonly known as: TYLENOL  PM Take 1 tablet by mouth at bedtime as needed.   ipratropium-albuterol  0.5-2.5 (3) MG/3ML Soln Commonly known as: DUONEB Take 3 mLs by  nebulization every 4 (four) hours as needed.   metoprolol  succinate 25 MG 24 hr tablet Commonly known as: TOPROL -XL Take 1 tablet (25 mg total) by mouth every evening.   mometasone -formoterol  100-5 MCG/ACT Aero Commonly known as: DULERA  Inhale 2 puffs into the lungs daily.   oxyCODONE  5 MG immediate release tablet Commonly known as: Roxicodone  Take 1 tablet (5 mg total) by mouth every 8 (eight) hours as needed.   pantoprazole  40 MG tablet Commonly known as: PROTONIX  Take 1 tablet (40 mg total) by mouth daily.   triamcinolone  ointment 0.1 % Commonly known as: KENALOG  Apply 1 Application topically 2 (two) times daily.   venlafaxine  XR 75 MG 24 hr capsule Commonly known as: EFFEXOR -XR TAKE 2 CAPSULES BY MOUTH DAILY       Allergies   Allergen Reactions   Trazodone  And Nefazodone Other (See Comments)    Agitation   Heparin  Nausea And Vomiting    Follow-up Information     [facility] Follow up.   Contact information: [address] [place] [address] [phone]                  The results of significant diagnostics from this hospitalization (including imaging, microbiology, ancillary and laboratory) are listed below for reference.    Significant Diagnostic Studies: CT Angio Chest PE W and/or Wo Contrast Result Date: 08/14/2023 CLINICAL DATA:  83 year old female with history of acute on chronic respiratory failure and elevated D-dimer. Evaluate for potential pulmonary embolism. History of lung cancer. * Tracking Code: BO * EXAM: CT ANGIOGRAPHY CHEST WITH CONTRAST TECHNIQUE: Multidetector CT imaging of the chest was performed using the standard protocol during bolus administration of intravenous contrast. Multiplanar CT image reconstructions and MIPs were obtained to evaluate the vascular anatomy. RADIATION DOSE REDUCTION: This exam was performed according to the departmental dose-optimization program which includes automated exposure control, adjustment of the mA and/or kV according to patient size and/or use of iterative reconstruction technique. CONTRAST:  75mL OMNIPAQUE  IOHEXOL  350 MG/ML SOLN COMPARISON:  Chest CTA 07/15/2023. FINDINGS: Cardiovascular: No filling defects are noted within the pulmonary arterial tree to suggest pulmonary embolism. Heart size is mildly enlarged with concentric left ventricular hypertrophy. There is no significant pericardial fluid, thickening or pericardial calcification. There is aortic atherosclerosis, as well as atherosclerosis of the great vessels of the mediastinum and the coronary arteries, including calcified atherosclerotic plaque in the left main, left anterior descending, left circumflex and right coronary arteries. Mild calcifications of the aortic valve.  Moderate calcifications of the mitral annulus. Dilatation of the pulmonic trunk (3.7 cm in diameter), concerning for pulmonary arterial hypertension. Mediastinum/Nodes: Extensive right hilar and mediastinal lymphadenopathy appears progressive compared to the prior study. Specific examples include a 1.7 cm short axis right hilar lymph node (axial image 53 of series 4), 1.6 cm short axis low right paratracheal lymph node (axial image 45 of series 4), 2.1 cm right paratracheal lymph node (axial image 39 of series 4), and a 1.3 cm superior mediastinal lymph node to the right of the proximal esophagus (axial image 18 of series 4), all of which appears slightly larger than the prior examination. Esophagus is unremarkable in appearance. No axillary lymphadenopathy. Lungs/Pleura: Patchy areas of ground-glass attenuation and septal thickening are again noted in the right lung, most severe in the right upper lobe, most compatible with evolving areas of postradiation pneumonitis and developing fibrosis. Previously noted hypermetabolic right upper lobe pulmonary nodule is not clearly defined on today's examination, suggesting a positive response  to therapy. There is a new nodular density in the periphery of the right upper lobe (axial image 50 of series 5) measuring 7 x 5 mm (mean diameter of 6 mm) which warrants attention on follow-up studies. Additional patchy areas of ground-glass attenuation and peribronchovascular nodularity are noted elsewhere in the lungs bilaterally, most evident in the lower lobes of the lungs, likely of infectious or inflammatory etiology. No pleural effusions. Upper Abdomen: Aortic atherosclerosis. Musculoskeletal: There are no aggressive appearing lytic or blastic lesions noted in the visualized portions of the skeleton. Review of the MIP images confirms the above findings. IMPRESSION: 1. No evidence of pulmonary embolism. 2. Evolving posttreatment changes of radiation therapy in the right lung with  regression of the original right upper lobe pulmonary nodule. However, today's study demonstrates evidence suggesting progressive nodal metastatic disease in the right hilar and mediastinal nodal stations, as above. 3. Findings in the lung bases concerning for active infection/inflammation. Clinical correlation for signs and symptoms of aspiration pneumonia is recommended. 4. Dilatation of the pulmonic trunk (3.7 cm in diameter), concerning for pulmonary arterial hypertension. 5. Mild cardiomegaly with concentric left ventricular hypertrophy. 6. Aortic atherosclerosis, in addition to left main and three-vessel coronary artery disease. 7. There are calcifications of the aortic valve and mitral annulus. Echocardiographic correlation for evaluation of potential valvular dysfunction may be warranted if clinically indicated. Aortic Atherosclerosis (ICD10-I70.0). Electronically Signed   By: Toribio Aye M.D.   On: 08/14/2023 08:18   DG Chest Portable 1 View Result Date: 08/14/2023 CLINICAL DATA:  83 year old female with cough, shortness of breath, right side chest pain. History of recently treated lung cancer. EXAM: PORTABLE CHEST 1 VIEW COMPARISON:  Portable chest 07/24/2023 and earlier. Chest CTA 07/15/2023. FINDINGS: Portable AP upright view at 0636 hours. Cardiomegaly, coarse right upper lung interstitial opacity and indistinct bilateral lung base opacity has not significantly changed from earlier this month. No superimposed pneumothorax, pulmonary edema. Small pleural effusions seen last month probably persist. Stable cardiac size and mediastinal contours. Visualized tracheal air column is within normal limits. Stable visualized osseous structures. Paucity of bowel gas. IMPRESSION: 1. Stable post treatment appearance of the chest. Unchanged right upper lung reticulonodular opacity which might be post radiation sequelae. Probable ongoing small pleural effusions. 2. No new cardiopulmonary abnormality.  Electronically Signed   By: VEAR Hurst M.D.   On: 08/14/2023 06:46   DG Chest Port 1 View Result Date: 07/24/2023 CLINICAL DATA:  Pulmonary edema. EXAM: PORTABLE CHEST 1 VIEW COMPARISON:  Chest radiograph dated 07/23/2023. FINDINGS: There is diffuse chronic interstitial coarsening. There are bibasilar atelectasis. Similar appearance of hazy density in the right upper lobe as seen on the prior radiograph and CT. No new consolidation. No pneumothorax. Stable cardiac silhouette no acute osseous pathology. IMPRESSION: No interval change.  No consolidation. Electronically Signed   By: Vanetta Chou M.D.   On: 07/24/2023 19:10   DG Chest Port 1 View Result Date: 07/23/2023 CLINICAL DATA:  83 year old female with shortness of breath and hemoptysis. Right upper lobe presume stage I lung cancer status post radiation treatment at the end of September. EXAM: PORTABLE CHEST 1 VIEW COMPARISON:  CTA chest 07/15/2023 and earlier. FINDINGS: Portable AP upright view at 0820 hours. Stable cardiomegaly and mediastinal contours. Visualized tracheal air column is within normal limits. Stable lung volumes. Improved right lung base ventilation since last month but new or increased reticulonodular opacity in the right upper lobe now. Underlying chronic pulmonary interstitial changes. No pneumothorax or pleural effusion. No acute  osseous abnormality identified. Paucity of bowel gas. IMPRESSION: Cardiomegaly and chronic lung disease with improved right lung base ventilation. Increased reticular opacity in the right upper lung might be radiation pneumonitis in this setting rather than acute infection or inflammation. Electronically Signed   By: VEAR Hurst M.D.   On: 07/23/2023 11:29    Microbiology: Recent Results (from the past 240 hours)  Blood culture (routine x 2)     Status: None   Collection Time: 08/14/23  6:30 AM   Specimen: BLOOD RIGHT ARM  Result Value Ref Range Status   Specimen Description BLOOD RIGHT ARM  Final    Special Requests   Final    BOTTLES DRAWN AEROBIC AND ANAEROBIC Blood Culture results may not be optimal due to an inadequate volume of blood received in culture bottles   Culture   Final    NO GROWTH 5 DAYS Performed at [facility], [address], [place], [place] [address]    Report Status 08/19/2023 FINAL  Final  Blood culture (routine x 2)     Status: None   Collection Time: 08/14/23  6:31 AM   Specimen: BLOOD  Result Value Ref Range Status   Specimen Description BLOOD LEFT AC  Final   Special Requests   Final    BOTTLES DRAWN AEROBIC AND ANAEROBIC Blood Culture results may not be optimal due to an inadequate volume of blood received in culture bottles   Culture   Final    NO GROWTH 5 DAYS Performed at [facility], [address] [place], [place] [address]    Report Status 08/19/2023 FINAL  Final  Resp panel by RT-PCR (RSV, Flu A&B, Covid) Anterior Nasal Swab     Status: None   Collection Time: 08/14/23  6:32 AM   Specimen: Anterior Nasal Swab  Result Value Ref Range Status   SARS Coronavirus 2 by RT PCR NEGATIVE NEGATIVE Final    Comment: (NOTE) SARS-CoV-2 target nucleic acids are NOT DETECTED.  The SARS-CoV-2 RNA is generally detectable in upper respiratory specimens during the acute phase of infection. The lowest concentration of SARS-CoV-2 viral copies this assay can detect is 138 copies/mL. A negative result does not preclude SARS-Cov-2 infection and should not be used as the sole basis for treatment or other patient management decisions. A negative result may occur with  improper specimen collection/handling, submission of specimen other than nasopharyngeal swab, presence of viral mutation(s) within the areas targeted by this assay, and inadequate number of viral copies(<138 copies/mL). A negative result must be combined with clinical observations, patient history, and epidemiological information. The expected result is Negative.  Fact Sheet  for Patients:  bloggercourse.com  Fact Sheet for Healthcare Providers:  seriousbroker.it  This test is no t yet approved or cleared by the [place]  FDA and  has been authorized for detection and/or diagnosis of SARS-CoV-2 by FDA under an Emergency Use Authorization (EUA). This EUA will remain  in effect (meaning this test can be used) for the duration of the COVID-19 declaration under Section 564(b)(1) of the Act, 21 U.S.C.section 360bbb-3(b)(1), unless the authorization is terminated  or revoked sooner.       Influenza A by PCR NEGATIVE NEGATIVE Final   Influenza B by PCR NEGATIVE NEGATIVE Final    Comment: (NOTE) The Xpert Xpress SARS-CoV-2/FLU/RSV plus assay is intended as an aid in the diagnosis of influenza from Nasopharyngeal swab specimens and should not be used as a sole basis for treatment. Nasal washings and aspirates  are unacceptable for Xpert Xpress SARS-CoV-2/FLU/RSV testing.  Fact Sheet for Patients: bloggercourse.com  Fact Sheet for Healthcare Providers: seriousbroker.it  This test is not yet approved or cleared by the [place]  FDA and has been authorized for detection and/or diagnosis of SARS-CoV-2 by FDA under an Emergency Use Authorization (EUA). This EUA will remain in effect (meaning this test can be used) for the duration of the COVID-19 declaration under Section 564(b)(1) of the Act, 21 U.S.C. section 360bbb-3(b)(1), unless the authorization is terminated or revoked.     Resp Syncytial Virus by PCR NEGATIVE NEGATIVE Final    Comment: (NOTE) Fact Sheet for Patients: bloggercourse.com  Fact Sheet for Healthcare Providers: seriousbroker.it  This test is not yet approved or cleared by the [place]  FDA and has been authorized for detection and/or diagnosis of SARS-CoV-2 by FDA under an  Emergency Use Authorization (EUA). This EUA will remain in effect (meaning this test can be used) for the duration of the COVID-19 declaration under Section 564(b)(1) of the Act, 21 U.S.C. section 360bbb-3(b)(1), unless the authorization is terminated or revoked.  Performed at [facility], [address] [place], [place] [address]   Expectorated Sputum Assessment w Gram Stain, Rflx to Resp Cult     Status: None   Collection Time: 08/19/23  9:25 PM   Specimen: Sputum  Result Value Ref Range Status   Specimen Description SPUTUM  Final   Special Requests Immunocompromised  Final   Sputum evaluation   Final    THIS SPECIMEN IS ACCEPTABLE FOR SPUTUM CULTURE Performed at [facility], [address], [place], [place] [address]    Report Status 08/19/2023 FINAL  Final  Culture, Respiratory w Gram Stain     Status: None (Preliminary result)   Collection Time: 08/19/23  9:25 PM   Specimen: SPU  Result Value Ref Range Status   Specimen Description   Final    SPUTUM Performed at [facility], [address] [place], [place] [address]    Special Requests   Final    Immunocompromised Reflexed from [id number] Performed at [facility], [address] [place], [place] [address]    Gram Stain   Final    FEW WBC PRESENT, PREDOMINANTLY PMN FEW GRAM POSITIVE COCCI Performed at [facility], [address], [place], [place] [address]    Culture PENDING  Incomplete   Report Status PENDING  Incomplete     Labs: Basic Metabolic Panel: Recent Labs  Lab 08/16/23 0434 08/17/23 0710 08/18/23 0557 08/19/23 0413 08/20/23 0624  NA 138 141 140 137 139  K 4.2 4.0 4.0 4.2 3.6  CL 97* 103 101 99 102  CO2 28 28 28 26 28   GLUCOSE 182* 210* 151* 155* 124*  BUN 30* 30* 24* 33* 28*  CREATININE 0.90 0.88 0.77 1.01* 0.92  CALCIUM  9.2 8.8* 8.7* 8.7* 8.3*  MG 2.0 2.0 2.1 2.4 2.2  PHOS 3.4 3.6 2.9 3.8 3.2   Liver Function Tests: Recent Labs  Lab  08/16/23 0434 08/17/23 0710 08/18/23 0557 08/19/23 0413 08/20/23 0624  AST 23 23 23 27 24   ALT 31 31 33 35 32  ALKPHOS 78 77 78 88 74  BILITOT 0.7 0.9 1.2* 1.0 0.9  PROT 6.0* 5.8* 5.6* 6.2* 5.5*  ALBUMIN 3.2* 2.9* 2.9* 3.0* 2.7*   No results for input(s): LIPASE, AMYLASE in the last 168 hours. No results for input(s): AMMONIA in the last 168 hours. CBC: Recent Labs  Lab 08/16/23 0434  08/17/23 0710 08/18/23 0557 08/19/23 0413 08/20/23 0624  WBC 11.6* 10.0 10.0 11.4* 8.0  NEUTROABS 10.3* 9.0* 8.6* 9.5* 6.4  HGB 10.9* 10.2* 10.7* 11.0* 10.1*  HCT 33.7* 31.3* 32.8* 33.4* 30.4*  MCV 89.2 87.7 88.9 88.4 86.4  PLT 190 183 162 203 165   Cardiac Enzymes: No results for input(s): CKTOTAL, CKMB, CKMBINDEX, TROPONINI in the last 168 hours. BNP: BNP (last 3 results) Recent Labs    12/24/22 1558 07/15/23 0852 08/14/23 0630  BNP 125.1* 846.1* 168.2*    ProBNP (last 3 results) No results for input(s): PROBNP in the last 8760 hours.  CBG: Recent Labs  Lab 08/17/23 0941  GLUCAP 228*       Signed:  Devaughn KATHEE Ban MD.  [facility] 08/21/2023, 10:39 AM

## 2023-08-21 NOTE — Progress Notes (Signed)
 [name] Douglas [place] to be D/C'd Home with hospice per MD order.  Discussed prescriptions and follow up appointments with the patient. Prescriptions given to patient, medication list explained in detail. Pt and daughter verbalized understanding.  Allergies as of 08/21/2023       Reactions   Trazodone  And Nefazodone Other (See Comments)   Agitation   Heparin  Nausea And Vomiting        Medication List     STOP taking these medications    Allergy Relief 10 MG tablet Generic drug: loratadine    amLODipine  10 MG tablet Commonly known as: NORVASC    atorvastatin  80 MG tablet Commonly known as: LIPITOR    budesonide  0.25 MG/2ML nebulizer solution Commonly known as: PULMICORT    dabigatran  150 MG Caps capsule Commonly known as: PRADAXA    dexamethasone  4 MG tablet Commonly known as: DECADRON    donepezil  10 MG tablet Commonly known as: ARICEPT    flecainide  50 MG tablet Commonly known as: TAMBOCOR    furosemide  20 MG tablet Commonly known as: LASIX    iron  polysaccharides 150 MG capsule Commonly known as: NIFEREX   levothyroxine  88 MCG tablet Commonly known as: SYNTHROID    sulfamethoxazole-trimethoprim 400-80 MG tablet Commonly known as: BACTRIM   vitamin B-12 500 MCG tablet Commonly known as: CYANOCOBALAMIN    vitamin C  250 MG tablet Commonly known as: ASCORBIC ACID    Vitamin D  (Ergocalciferol ) 1.25 MG (50000 UNIT) Caps capsule Commonly known as: DRISDOL        TAKE these medications    albuterol  108 (90 Base) MCG/ACT inhaler Commonly known as: VENTOLIN  HFA Inhale 2 puffs into the lungs every 4 (four) hours as needed.   Arnuity Ellipta  50 MCG/ACT Aepb Generic drug: Fluticasone  Furoate Inhale 1 puff into the lungs 2 (two) times daily.   diphenhydramine -acetaminophen  25-500 MG Tabs tablet Commonly known as: TYLENOL  PM Take 1 tablet by mouth at bedtime as needed.   ipratropium-albuterol  0.5-2.5 (3) MG/3ML Soln Commonly known as: DUONEB Take 3 mLs by nebulization  every 4 (four) hours as needed.   metoprolol  succinate 25 MG 24 hr tablet Commonly known as: TOPROL -XL Take 1 tablet (25 mg total) by mouth every evening.   mometasone -formoterol  100-5 MCG/ACT Aero Commonly known as: DULERA  Inhale 2 puffs into the lungs daily.   oxyCODONE  5 MG immediate release tablet Commonly known as: Roxicodone  Take 1 tablet (5 mg total) by mouth every 8 (eight) hours as needed.   pantoprazole  40 MG tablet Commonly known as: PROTONIX  Take 1 tablet (40 mg total) by mouth daily.   triamcinolone  ointment 0.1 % Commonly known as: KENALOG  Apply 1 Application topically 2 (two) times daily.   venlafaxine  XR 75 MG 24 hr capsule Commonly known as: EFFEXOR -XR TAKE 2 CAPSULES BY MOUTH DAILY        Vitals:   08/21/23 0431 08/21/23 0828  BP: (!) 152/50 (!) 159/67  Pulse: 68 (!) 106  Resp: 16 18  Temp: 97.8 F (36.6 C) 98.1 F (36.7 C)  SpO2: 95% 91%    Skin clean, dry and intact without evidence of skin break down, no evidence of skin tears noted. IV catheter discontinued intact. Site without signs and symptoms of complications. Dressing and pressure applied. Pt denies pain at this time. No complaints noted.  An After Visit Summary was printed and given to the pt daughter. EMS arrived and loaded patient on the stretcher. Daughter gathered patient's personal belongings.   [name] Douglas

## 2023-08-21 NOTE — TOC Transition Note (Signed)
 Transition of Care [facility]) - Discharge Note   Patient Details  Name: [name] Douglas MRN: [id number] Date of Birth: [birth year]  [facility] CM/SW Contact:  [name], RN Phone Number: 08/21/2023, 11:53 AM   Clinical Narrative:     Notified by Wilbert with [facility] that DME as been delivered  Patient to discharge home today with [facility]    [facility] transport called  Daughter Clotilda notified     Barriers to Discharge: Continued Medical Work up   Patient Goals and CMS Choice   CMS Medicare.gov Compare Post Acute Care list provided to:: Patient Represenative (must comment) Choice offered to / list presented to : Adult Children      Discharge Placement                       Discharge Plan and Services Additional resources added to the After Visit Summary for                                       Social Drivers of Health (SDOH) Interventions SDOH Screenings   Food Insecurity: No Food Insecurity (08/14/2023)  Housing: Low Risk  (08/14/2023)  Transportation Needs: No Transportation Needs (08/14/2023)  Utilities: Not At Risk (08/14/2023)  Alcohol Screen: Low Risk  (08/08/2020)  Depression (PHQ2-9): Low Risk  (04/10/2021)  Financial Resource Strain: Low Risk  (06/03/2023)   Received from [facility]  Social Connections: Moderately Integrated (08/20/2023)  Tobacco Use: Medium Risk (08/14/2023)     Readmission Risk Interventions    07/17/2023   11:52 AM  Readmission Risk Prevention Plan  Transportation Screening Complete  PCP or Specialist Appt within 3-5 Days Complete  HRI or Home Care Consult Complete  Social Work Consult for Recovery Care Planning/Counseling Complete  Palliative Care Screening Not Applicable  Medication Review Ocean[employer]) Complete

## 2023-08-21 NOTE — Plan of Care (Signed)
   Problem: Clinical Measurements: Goal: Diagnostic test results will improve Outcome: Progressing   Problem: Health Behavior/Discharge Planning: Goal: Ability to manage health-related needs will improve Outcome: Progressing   Problem: Education: Goal: Knowledge of General Education information will improve Description: Including pain rating scale, medication(s)/side effects and non-pharmacologic comfort measures Outcome: Progressing

## 2023-08-22 DIAGNOSIS — F419 Anxiety disorder, unspecified: Secondary | ICD-10-CM | POA: Diagnosis not present

## 2023-08-22 DIAGNOSIS — C779 Secondary and unspecified malignant neoplasm of lymph node, unspecified: Secondary | ICD-10-CM | POA: Diagnosis not present

## 2023-08-22 DIAGNOSIS — K219 Gastro-esophageal reflux disease without esophagitis: Secondary | ICD-10-CM | POA: Diagnosis not present

## 2023-08-22 DIAGNOSIS — F32A Depression, unspecified: Secondary | ICD-10-CM | POA: Diagnosis not present

## 2023-08-22 DIAGNOSIS — E785 Hyperlipidemia, unspecified: Secondary | ICD-10-CM | POA: Diagnosis not present

## 2023-08-22 DIAGNOSIS — J4542 Moderate persistent asthma with status asthmaticus: Secondary | ICD-10-CM | POA: Diagnosis not present

## 2023-08-22 DIAGNOSIS — E1165 Type 2 diabetes mellitus with hyperglycemia: Secondary | ICD-10-CM | POA: Diagnosis not present

## 2023-08-22 DIAGNOSIS — J9611 Chronic respiratory failure with hypoxia: Secondary | ICD-10-CM | POA: Diagnosis not present

## 2023-08-22 DIAGNOSIS — J449 Chronic obstructive pulmonary disease, unspecified: Secondary | ICD-10-CM | POA: Diagnosis not present

## 2023-08-22 DIAGNOSIS — E669 Obesity, unspecified: Secondary | ICD-10-CM | POA: Diagnosis not present

## 2023-08-22 DIAGNOSIS — I10 Essential (primary) hypertension: Secondary | ICD-10-CM | POA: Diagnosis not present

## 2023-08-22 DIAGNOSIS — C3491 Malignant neoplasm of unspecified part of right bronchus or lung: Secondary | ICD-10-CM | POA: Diagnosis not present

## 2023-08-22 LAB — CULTURE, RESPIRATORY W GRAM STAIN

## 2023-08-23 DIAGNOSIS — C3491 Malignant neoplasm of unspecified part of right bronchus or lung: Secondary | ICD-10-CM | POA: Diagnosis not present

## 2023-08-23 DIAGNOSIS — C779 Secondary and unspecified malignant neoplasm of lymph node, unspecified: Secondary | ICD-10-CM | POA: Diagnosis not present

## 2023-08-23 DIAGNOSIS — I10 Essential (primary) hypertension: Secondary | ICD-10-CM | POA: Diagnosis not present

## 2023-08-23 DIAGNOSIS — J9611 Chronic respiratory failure with hypoxia: Secondary | ICD-10-CM | POA: Diagnosis not present

## 2023-08-23 DIAGNOSIS — F32A Depression, unspecified: Secondary | ICD-10-CM | POA: Diagnosis not present

## 2023-08-23 DIAGNOSIS — F419 Anxiety disorder, unspecified: Secondary | ICD-10-CM | POA: Diagnosis not present

## 2023-08-27 DIAGNOSIS — I10 Essential (primary) hypertension: Secondary | ICD-10-CM | POA: Diagnosis not present

## 2023-08-27 DIAGNOSIS — F419 Anxiety disorder, unspecified: Secondary | ICD-10-CM | POA: Diagnosis not present

## 2023-08-27 DIAGNOSIS — C3491 Malignant neoplasm of unspecified part of right bronchus or lung: Secondary | ICD-10-CM | POA: Diagnosis not present

## 2023-08-27 DIAGNOSIS — J9611 Chronic respiratory failure with hypoxia: Secondary | ICD-10-CM | POA: Diagnosis not present

## 2023-08-27 DIAGNOSIS — C779 Secondary and unspecified malignant neoplasm of lymph node, unspecified: Secondary | ICD-10-CM | POA: Diagnosis not present

## 2023-08-27 DIAGNOSIS — F32A Depression, unspecified: Secondary | ICD-10-CM | POA: Diagnosis not present

## 2023-08-29 DIAGNOSIS — I10 Essential (primary) hypertension: Secondary | ICD-10-CM | POA: Diagnosis not present

## 2023-08-29 DIAGNOSIS — J9611 Chronic respiratory failure with hypoxia: Secondary | ICD-10-CM | POA: Diagnosis not present

## 2023-08-29 DIAGNOSIS — C779 Secondary and unspecified malignant neoplasm of lymph node, unspecified: Secondary | ICD-10-CM | POA: Diagnosis not present

## 2023-08-29 DIAGNOSIS — C3491 Malignant neoplasm of unspecified part of right bronchus or lung: Secondary | ICD-10-CM | POA: Diagnosis not present

## 2023-08-29 DIAGNOSIS — F419 Anxiety disorder, unspecified: Secondary | ICD-10-CM | POA: Diagnosis not present

## 2023-08-29 DIAGNOSIS — F32A Depression, unspecified: Secondary | ICD-10-CM | POA: Diagnosis not present

## 2023-08-30 DIAGNOSIS — F419 Anxiety disorder, unspecified: Secondary | ICD-10-CM | POA: Diagnosis not present

## 2023-08-30 DIAGNOSIS — R404 Transient alteration of awareness: Secondary | ICD-10-CM | POA: Diagnosis not present

## 2023-08-30 DIAGNOSIS — J9611 Chronic respiratory failure with hypoxia: Secondary | ICD-10-CM | POA: Diagnosis not present

## 2023-08-30 DIAGNOSIS — F32A Depression, unspecified: Secondary | ICD-10-CM | POA: Diagnosis not present

## 2023-08-30 DIAGNOSIS — C779 Secondary and unspecified malignant neoplasm of lymph node, unspecified: Secondary | ICD-10-CM | POA: Diagnosis not present

## 2023-08-30 DIAGNOSIS — I10 Essential (primary) hypertension: Secondary | ICD-10-CM | POA: Diagnosis not present

## 2023-08-30 DIAGNOSIS — C3491 Malignant neoplasm of unspecified part of right bronchus or lung: Secondary | ICD-10-CM | POA: Diagnosis not present

## 2023-08-31 DIAGNOSIS — J9611 Chronic respiratory failure with hypoxia: Secondary | ICD-10-CM | POA: Diagnosis not present

## 2023-08-31 DIAGNOSIS — F32A Depression, unspecified: Secondary | ICD-10-CM | POA: Diagnosis not present

## 2023-08-31 DIAGNOSIS — I10 Essential (primary) hypertension: Secondary | ICD-10-CM | POA: Diagnosis not present

## 2023-08-31 DIAGNOSIS — C3491 Malignant neoplasm of unspecified part of right bronchus or lung: Secondary | ICD-10-CM | POA: Diagnosis not present

## 2023-08-31 DIAGNOSIS — F419 Anxiety disorder, unspecified: Secondary | ICD-10-CM | POA: Diagnosis not present

## 2023-08-31 DIAGNOSIS — C779 Secondary and unspecified malignant neoplasm of lymph node, unspecified: Secondary | ICD-10-CM | POA: Diagnosis not present

## 2023-09-01 DIAGNOSIS — F419 Anxiety disorder, unspecified: Secondary | ICD-10-CM | POA: Diagnosis not present

## 2023-09-01 DIAGNOSIS — I10 Essential (primary) hypertension: Secondary | ICD-10-CM | POA: Diagnosis not present

## 2023-09-01 DIAGNOSIS — C779 Secondary and unspecified malignant neoplasm of lymph node, unspecified: Secondary | ICD-10-CM | POA: Diagnosis not present

## 2023-09-01 DIAGNOSIS — F32A Depression, unspecified: Secondary | ICD-10-CM | POA: Diagnosis not present

## 2023-09-01 DIAGNOSIS — C3491 Malignant neoplasm of unspecified part of right bronchus or lung: Secondary | ICD-10-CM | POA: Diagnosis not present

## 2023-09-01 DIAGNOSIS — J9611 Chronic respiratory failure with hypoxia: Secondary | ICD-10-CM | POA: Diagnosis not present

## 2023-09-20 ENCOUNTER — Ambulatory Visit: Payer: Medicare Other

## 2023-09-21 DEATH — deceased

## 2023-09-30 ENCOUNTER — Ambulatory Visit: Payer: Medicare Other | Admitting: Radiation Oncology

## 2023-10-22 ENCOUNTER — Ambulatory Visit: Payer: Medicare Other | Admitting: Internal Medicine
